# Patient Record
Sex: Male | Born: 1950
Health system: Southern US, Community
[De-identification: ages and names within clinical notes are randomized; demographics above are authoritative.]

## PROBLEM LIST (undated history)

## (undated) DIAGNOSIS — E119 Type 2 diabetes mellitus without complications: Secondary | ICD-10-CM

## (undated) DIAGNOSIS — Z9981 Dependence on supplemental oxygen: Secondary | ICD-10-CM

## (undated) DIAGNOSIS — F32A Depression, unspecified: Secondary | ICD-10-CM

## (undated) DIAGNOSIS — Z87898 Personal history of other specified conditions: Secondary | ICD-10-CM

## (undated) DIAGNOSIS — T4145XA Adverse effect of unspecified anesthetic, initial encounter: Secondary | ICD-10-CM

## (undated) DIAGNOSIS — T8859XA Other complications of anesthesia, initial encounter: Secondary | ICD-10-CM

## (undated) DIAGNOSIS — C439 Malignant melanoma of skin, unspecified: Secondary | ICD-10-CM

## (undated) DIAGNOSIS — Z9889 Other specified postprocedural states: Secondary | ICD-10-CM

## (undated) DIAGNOSIS — K219 Gastro-esophageal reflux disease without esophagitis: Secondary | ICD-10-CM

## (undated) DIAGNOSIS — F209 Schizophrenia, unspecified: Secondary | ICD-10-CM

## (undated) DIAGNOSIS — I499 Cardiac arrhythmia, unspecified: Secondary | ICD-10-CM

## (undated) DIAGNOSIS — Z8489 Family history of other specified conditions: Secondary | ICD-10-CM

## (undated) DIAGNOSIS — Z8611 Personal history of tuberculosis: Secondary | ICD-10-CM

## (undated) DIAGNOSIS — G4734 Idiopathic sleep related nonobstructive alveolar hypoventilation: Secondary | ICD-10-CM

## (undated) DIAGNOSIS — L509 Urticaria, unspecified: Secondary | ICD-10-CM

## (undated) DIAGNOSIS — C44519 Basal cell carcinoma of skin of other part of trunk: Secondary | ICD-10-CM

## (undated) DIAGNOSIS — Z789 Other specified health status: Secondary | ICD-10-CM

## (undated) DIAGNOSIS — K5904 Chronic idiopathic constipation: Secondary | ICD-10-CM

## (undated) DIAGNOSIS — Z8709 Personal history of other diseases of the respiratory system: Secondary | ICD-10-CM

## (undated) DIAGNOSIS — G4733 Obstructive sleep apnea (adult) (pediatric): Secondary | ICD-10-CM

## (undated) DIAGNOSIS — A159 Respiratory tuberculosis unspecified: Secondary | ICD-10-CM

## (undated) DIAGNOSIS — F419 Anxiety disorder, unspecified: Secondary | ICD-10-CM

## (undated) DIAGNOSIS — I10 Essential (primary) hypertension: Secondary | ICD-10-CM

## (undated) DIAGNOSIS — F329 Major depressive disorder, single episode, unspecified: Secondary | ICD-10-CM

## (undated) DIAGNOSIS — J441 Chronic obstructive pulmonary disease with (acute) exacerbation: Secondary | ICD-10-CM

## (undated) DIAGNOSIS — Z8679 Personal history of other diseases of the circulatory system: Secondary | ICD-10-CM

## (undated) DIAGNOSIS — M1990 Unspecified osteoarthritis, unspecified site: Secondary | ICD-10-CM

## (undated) DIAGNOSIS — Z87820 Personal history of traumatic brain injury: Secondary | ICD-10-CM

## (undated) DIAGNOSIS — J439 Emphysema, unspecified: Secondary | ICD-10-CM

## (undated) DIAGNOSIS — Z87891 Personal history of nicotine dependence: Secondary | ICD-10-CM

## (undated) DIAGNOSIS — J45909 Unspecified asthma, uncomplicated: Secondary | ICD-10-CM

## (undated) DIAGNOSIS — R112 Nausea with vomiting, unspecified: Secondary | ICD-10-CM

## (undated) DIAGNOSIS — N320 Bladder-neck obstruction: Secondary | ICD-10-CM

## (undated) DIAGNOSIS — G14 Postpolio syndrome: Secondary | ICD-10-CM

## (undated) DIAGNOSIS — C61 Malignant neoplasm of prostate: Secondary | ICD-10-CM

## (undated) DIAGNOSIS — J449 Chronic obstructive pulmonary disease, unspecified: Secondary | ICD-10-CM

## (undated) HISTORY — DX: Basal cell carcinoma of skin of other part of trunk: C44.519

## (undated) HISTORY — PX: ADENOIDECTOMY: SUR15

## (undated) HISTORY — PX: CARDIOVASCULAR STRESS TEST: SHX262

## (undated) HISTORY — DX: Other specified health status: Z78.9

## (undated) HISTORY — DX: Unspecified asthma, uncomplicated: J45.909

## (undated) HISTORY — DX: Chronic idiopathic constipation: K59.04

## (undated) HISTORY — DX: Gastro-esophageal reflux disease without esophagitis: K21.9

## (undated) HISTORY — PX: SHOULDER ARTHROSCOPY WITH OPEN ROTATOR CUFF REPAIR: SHX6092

## (undated) HISTORY — DX: Personal history of nicotine dependence: Z87.891

## (undated) HISTORY — PX: TONSILLECTOMY: SUR1361

## (undated) HISTORY — DX: Emphysema, unspecified: J43.9

## (undated) HISTORY — PX: OTHER SURGICAL HISTORY: SHX169

## (undated) HISTORY — DX: Respiratory tuberculosis unspecified: A15.9

## (undated) HISTORY — DX: Obstructive sleep apnea (adult) (pediatric): G47.33

## (undated) HISTORY — DX: Malignant neoplasm of prostate: C61

## (undated) HISTORY — DX: Urticaria, unspecified: L50.9

---

## 1982-10-15 DIAGNOSIS — A159 Respiratory tuberculosis unspecified: Secondary | ICD-10-CM

## 1982-10-15 HISTORY — DX: Respiratory tuberculosis unspecified: A15.9

## 1998-10-15 HISTORY — PX: NASAL SEPTUM SURGERY: SHX37

## 1998-10-15 HISTORY — PX: OTHER SURGICAL HISTORY: SHX169

## 2006-09-30 ENCOUNTER — Emergency Department (HOSPITAL_COMMUNITY): Admission: EM | Admit: 2006-09-30 | Discharge: 2006-10-01 | Payer: Self-pay | Admitting: Emergency Medicine

## 2006-10-02 ENCOUNTER — Encounter: Admission: RE | Admit: 2006-10-02 | Discharge: 2006-10-02 | Payer: Self-pay | Admitting: Orthopaedic Surgery

## 2007-12-24 ENCOUNTER — Ambulatory Visit: Payer: Self-pay | Admitting: Vascular Surgery

## 2007-12-24 ENCOUNTER — Ambulatory Visit: Admission: RE | Admit: 2007-12-24 | Discharge: 2007-12-24 | Payer: Self-pay | Admitting: Family Medicine

## 2007-12-24 ENCOUNTER — Encounter (INDEPENDENT_AMBULATORY_CARE_PROVIDER_SITE_OTHER): Payer: Self-pay | Admitting: Orthopedic Surgery

## 2009-02-10 ENCOUNTER — Encounter: Admission: RE | Admit: 2009-02-10 | Discharge: 2009-02-10 | Payer: Self-pay | Admitting: Orthopedic Surgery

## 2009-03-22 ENCOUNTER — Encounter: Payer: Self-pay | Admitting: Urology

## 2009-03-22 ENCOUNTER — Ambulatory Visit (HOSPITAL_BASED_OUTPATIENT_CLINIC_OR_DEPARTMENT_OTHER): Admission: RE | Admit: 2009-03-22 | Discharge: 2009-03-22 | Payer: Self-pay | Admitting: Urology

## 2009-03-22 HISTORY — PX: OTHER SURGICAL HISTORY: SHX169

## 2009-07-26 ENCOUNTER — Encounter: Admission: RE | Admit: 2009-07-26 | Discharge: 2009-07-26 | Payer: Self-pay | Admitting: Family Medicine

## 2011-01-22 LAB — POCT I-STAT 4, (NA,K, GLUC, HGB,HCT)
Glucose, Bld: 108 mg/dL — ABNORMAL HIGH (ref 70–99)
HCT: 45 % (ref 39.0–52.0)

## 2011-02-27 NOTE — Op Note (Signed)
[name], [name]              ACCOUNT NO.:  [id number]   MEDICAL RECORD NO.:  [id number]          PATIENT TYPE:  AMB   LOCATION:  [facility]                         FACILITY:  [facility]   PHYSICIAN:  Excell Seltzer. Annabell Howells, M.D.    DATE OF BIRTH:  [birth year]   DATE OF PROCEDURE:  03/22/2009  DATE OF DISCHARGE:                               OPERATIVE REPORT   PROCEDURES:  1. Cystoscopy.  2. Cystogram with interpretation.  3. Transrectal prostate ultrasound with biopsy.   PREOPERATIVE DIAGNOSES:  Nodular prostate with voiding difficulties and  elevated prostatic specific antigen.   POSTOPERATIVE DIAGNOSES:  1. Nodular prostate with voiding difficulties and elevated prostatic      specific antigen.  2. Grade 1 right ureteral reflux and bilateral hutch diverticula.   SURGEON:  Dr. Bjorn Pippin   ANESTHESIA:  General.   SPECIMEN:  Prostate biopsies x12.   COMPLICATIONS:  None.   INDICATIONS:  [name] is a 60 year old white male with history of  Klinefelter syndrome.  He has a history of an elevated PSA and  hypogonadism.  On exam, he was found to have a nodular prostate in the  right mid prostate.   FINDINGS AND PROCEDURE:  He was taken to the operating room where he  received Cipro and Rocephin.  He was given preoperative enema, as well  as sedation.  He was placed under general anesthetic and then positioned  in the lithotomy position.  His perineum and genitalia were prepped  Betadine solution.  He was draped in the usual sterile fashion.  Cystoscopy was performed using the 16-French flexible scope.  Examination revealed a normal urethra.  The external sphincter was  intact.  The prostatic urethra was short with no evidence of hyperplasia  and the bladder neck was open.  Examination of the bladder revealed mild  trabeculation.  There were bilateral such diverticula, left greater than  right.  Initially, I was unable to identify the ureteral orifices.   I performed a cystogram with 150  mL of Omnipaque.  This confirmed the  presence of hutch diverticula.  There appeared to be at most mild grade  1 reflux on the right.   After completion of the cystometrogram which only showed the hutch  diverticula and minimal reflux, repeat cystoscopy was performed.  At  this point, I was able to identify the ureteral orifices.  They were  small but medial to the diverticula.  The bladder was then drained and a  prostate ultrasound was performed.   The transrectal ultrasound probe had been prepared and assembled for  biopsy.  It was passed transrectally.  The prostate was imaged.  The  ultrasound demonstrated normal-appearing seminal vesicles.  The  diverticula could be visualized at the base of the bladder.  The  prostate was 39 mL.  There was some vague asymmetry of the glans, the  left having a slightly more hypoechoic variegated echotexture than the  right, but otherwise the peripheral and transitional zones were  unremarkable.  There was no significant median lobe.   After completion of the diagnostic scan, transrectal ultrasound biopsy  was  performed using the standard 12 core template.  Each biopsy was sent  in a separate bottle.  At the completion of the biopsy, there was some  mild rectal bleeding which was controlled with pressure on the prostate.  Once the bleeding had stopped, the patient was taken down from lithotomy  position.  His anesthetic was reversed.  He was moved to the recovery  room in stable condition.  There were no complications.      Excell Seltzer. Annabell Howells, M.D.  Electronically Signed     JJW/MEDQ  D:  03/22/2009  T:  03/22/2009  Job:  [id number]   cc:   Windle Guard, M.D.  Fax: [phone]

## 2011-08-08 ENCOUNTER — Emergency Department (HOSPITAL_COMMUNITY)
Admission: EM | Admit: 2011-08-08 | Discharge: 2011-08-08 | Disposition: A | Discharge status: Home / Self Care | Payer: Medicare Other | Attending: Emergency Medicine | Admitting: Emergency Medicine

## 2011-08-08 ENCOUNTER — Emergency Department (HOSPITAL_COMMUNITY): Payer: Medicare Other

## 2011-08-08 DIAGNOSIS — R062 Wheezing: Secondary | ICD-10-CM | POA: Insufficient documentation

## 2011-08-08 DIAGNOSIS — R0609 Other forms of dyspnea: Secondary | ICD-10-CM | POA: Insufficient documentation

## 2011-08-08 DIAGNOSIS — I10 Essential (primary) hypertension: Secondary | ICD-10-CM | POA: Insufficient documentation

## 2011-08-08 DIAGNOSIS — R0989 Other specified symptoms and signs involving the circulatory and respiratory systems: Secondary | ICD-10-CM | POA: Insufficient documentation

## 2011-08-08 DIAGNOSIS — J449 Chronic obstructive pulmonary disease, unspecified: Secondary | ICD-10-CM | POA: Insufficient documentation

## 2011-08-08 DIAGNOSIS — I252 Old myocardial infarction: Secondary | ICD-10-CM | POA: Insufficient documentation

## 2011-08-08 DIAGNOSIS — Z79899 Other long term (current) drug therapy: Secondary | ICD-10-CM | POA: Insufficient documentation

## 2011-08-08 DIAGNOSIS — R079 Chest pain, unspecified: Secondary | ICD-10-CM | POA: Insufficient documentation

## 2011-08-08 DIAGNOSIS — R05 Cough: Secondary | ICD-10-CM | POA: Insufficient documentation

## 2011-08-08 DIAGNOSIS — J4489 Other specified chronic obstructive pulmonary disease: Secondary | ICD-10-CM | POA: Insufficient documentation

## 2011-08-08 DIAGNOSIS — R0602 Shortness of breath: Secondary | ICD-10-CM | POA: Insufficient documentation

## 2011-08-08 DIAGNOSIS — R059 Cough, unspecified: Secondary | ICD-10-CM | POA: Insufficient documentation

## 2011-08-08 DIAGNOSIS — E7800 Pure hypercholesterolemia, unspecified: Secondary | ICD-10-CM | POA: Insufficient documentation

## 2011-08-08 LAB — COMPREHENSIVE METABOLIC PANEL
ALT: 47 U/L (ref 0–53)
Albumin: 3.7 g/dL (ref 3.5–5.2)
Alkaline Phosphatase: 51 U/L (ref 39–117)
BUN: 11 mg/dL (ref 6–23)
CO2: 21 mEq/L (ref 19–32)
Chloride: 98 mEq/L (ref 96–112)
GFR calc Af Amer: 80 mL/min — ABNORMAL LOW (ref >90)
Glucose, Bld: 103 mg/dL — ABNORMAL HIGH (ref 70–99)
Potassium: 2.9 mEq/L — ABNORMAL LOW (ref 3.5–5.1)
Total Protein: 6.8 g/dL (ref 6.0–8.3)

## 2011-08-08 LAB — CBC
HCT: 39 % (ref 39.0–52.0)
Hemoglobin: 13.7 g/dL (ref 13.0–17.0)
MCH: 32 pg (ref 26.0–34.0)

## 2011-08-08 LAB — DIFFERENTIAL
Basophils Absolute: 0 10*3/uL (ref 0.0–0.1)
Eosinophils Relative: 9 % — ABNORMAL HIGH (ref 0–5)
Lymphocytes Relative: 31 % (ref 12–46)
Neutrophils Relative %: 52 % (ref 43–77)

## 2011-10-16 HISTORY — PX: LAPAROSCOPIC CHOLECYSTECTOMY: SUR755

## 2011-11-20 ENCOUNTER — Encounter (HOSPITAL_COMMUNITY): Payer: Self-pay

## 2011-11-20 ENCOUNTER — Emergency Department (HOSPITAL_COMMUNITY)
Admission: EM | Admit: 2011-11-20 | Discharge: 2011-11-20 | Disposition: A | Discharge status: Home / Self Care | Payer: Medicare Other | Attending: Emergency Medicine | Admitting: Emergency Medicine

## 2011-11-20 DIAGNOSIS — J4489 Other specified chronic obstructive pulmonary disease: Secondary | ICD-10-CM | POA: Insufficient documentation

## 2011-11-20 DIAGNOSIS — Z8612 Personal history of poliomyelitis: Secondary | ICD-10-CM | POA: Insufficient documentation

## 2011-11-20 DIAGNOSIS — I10 Essential (primary) hypertension: Secondary | ICD-10-CM | POA: Insufficient documentation

## 2011-11-20 DIAGNOSIS — M25559 Pain in unspecified hip: Secondary | ICD-10-CM

## 2011-11-20 DIAGNOSIS — J449 Chronic obstructive pulmonary disease, unspecified: Secondary | ICD-10-CM | POA: Insufficient documentation

## 2011-11-20 DIAGNOSIS — Z79899 Other long term (current) drug therapy: Secondary | ICD-10-CM | POA: Insufficient documentation

## 2011-11-20 DIAGNOSIS — R110 Nausea: Secondary | ICD-10-CM | POA: Insufficient documentation

## 2011-11-20 HISTORY — DX: Chronic obstructive pulmonary disease, unspecified: J44.9

## 2011-11-20 HISTORY — DX: Essential (primary) hypertension: I10

## 2011-11-20 MED ORDER — OXYCODONE-ACETAMINOPHEN 5-325 MG PO TABS: 1.0000 | ORAL_TABLET | ORAL | Status: AC | PRN

## 2011-11-20 MED ORDER — ONDANSETRON HCL 4 MG PO TABS: 4.0000 mg | ORAL_TABLET | Freq: Four times a day (QID) | ORAL | Status: AC

## 2011-11-20 MED ORDER — HYDROMORPHONE HCL PF 2 MG/ML IJ SOLN
2.0000 mg | Freq: Once | INTRAMUSCULAR | Status: AC
  Administered 2011-11-20: 2 mg via INTRAMUSCULAR
  Filled 2011-11-20: qty 1

## 2011-11-20 NOTE — ED Notes (Signed)
Lt. Hip pain began a few months ago,  And the pain is progressively becoming worse,  He went to [facility] and was directed to [name] for pain control

## 2011-11-20 NOTE — ED Provider Notes (Signed)
61 year old male has chronic left hip pain 24 hours a day for several months gradually worsening and was seen by orthopedics was unremarkable films and sent to the ED for pain management, the patient is aware that the emergency room is not specialist and chronic pain management however we will help him acutely for a few days until he can get a recheck later this week with his doctor or orthopedics, his left hip is tender with movement he denies any distal weakness or numbness  Hurman Horn, MD 11/21/11 2127

## 2011-11-20 NOTE — ED Notes (Signed)
Sitting upright on side of stretcher with wife at bedside; states pain better since adm of Dilaudid; GingerAle with ice given per pt request

## 2011-11-20 NOTE — ED Provider Notes (Signed)
History     CSN: [id number]  Arrival date & time 11/20/11  5621   First MD Initiated Contact with Patient 11/20/11 2005      Chief Complaint  Patient presents with  . Hip Pain    (Consider location/radiation/quality/duration/timing/severity/associated sxs/prior treatment) Patient is a 61 y.o. male presenting with hip pain. The history is provided by the patient.  Hip Pain This is a chronic problem. Episode onset: months ago, worse today. The problem occurs constantly. The problem has been gradually worsening. Associated symptoms include nausea. Pertinent negatives include no abdominal pain, chest pain, congestion, coughing, fever, rash or vomiting. The symptoms are aggravated by standing, walking and twisting. He has tried oral narcotics for the symptoms. The treatment provided mild relief.    Past Medical History  Diagnosis Date  . COPD (chronic obstructive pulmonary disease)   . Hypertension   . Polio   . Bronchitis     Past Surgical History  Procedure Date  . Rotator cuff repair     History reviewed. No pertinent family history.  History  Substance Use Topics  . Smoking status: Former Games developer  . Smokeless tobacco: Not on file  . Alcohol Use: Yes      Review of Systems  Constitutional: Negative for fever.  HENT: Negative for congestion, facial swelling and trouble swallowing.   Respiratory: Negative for cough and shortness of breath.   Cardiovascular: Negative for chest pain.  Gastrointestinal: Positive for nausea. Negative for vomiting, abdominal pain and diarrhea.  Genitourinary: Negative for difficulty urinating.  Skin: Negative for rash.  All other systems reviewed and are negative.    Allergies  Ivp dye; Penicillins; and Aspirin  Home Medications   Current Outpatient Rx  Name Route Sig Dispense Refill  . IPRATROPIUM-ALBUTEROL 18-103 MCG/ACT IN AERO Inhalation Inhale 2 puffs into the lungs every 6 (six) hours as needed.    Marland Kitchen DICYCLOMINE HCL 20 MG PO  TABS Oral Take 20 mg by mouth every 8 (eight) hours as needed. For IBS    . ESOMEPRAZOLE MAGNESIUM 40 MG PO CPDR Oral Take 40 mg by mouth 2 (two) times daily.    Marland Kitchen METOCLOPRAMIDE HCL 10 MG PO TABS Oral Take 10 mg by mouth 4 (four) times daily.    . MOMETASONE FURO-FORMOTEROL FUM 100-5 MCG/ACT IN AERO Inhalation Inhale 2 puffs into the lungs 2 (two) times daily.    Marland Kitchen MONTELUKAST SODIUM 10 MG PO TABS Oral Take 10 mg by mouth at bedtime.    Marland Kitchen NITROGLYCERIN 0.4 MG SL SUBL Sublingual Place 0.4 mg under the tongue every 5 (five) minutes as needed. For chest pain    . TRIAMTERENE-HCTZ 75-50 MG PO TABS Oral Take 0.5 tablets by mouth daily.    Marland Kitchen VERAPAMIL HCL ER 120 MG PO CP24 Oral Take 120 mg by mouth 2 (two) times daily.      BP 128/76  Pulse 92  Temp(Src) 97.5 F (36.4 C) (Oral)  Resp 18  Ht 6\' 1"  (1.854 m)  Wt 203 lb (92.08 kg)  BMI 26.78 kg/m2  SpO2 98%  Physical Exam  Nursing note and vitals reviewed. Constitutional: He is oriented to person, place, and time. He appears well-developed and well-nourished. No distress.  HENT:  Head: Normocephalic and atraumatic.  Mouth/Throat: Oropharynx is clear and moist.  Eyes: Conjunctivae are normal. Pupils are equal, round, and reactive to light. No scleral icterus.  Neck: Normal range of motion. Neck supple.  Cardiovascular: Normal rate, regular rhythm, normal heart sounds and intact  distal pulses.   No murmur heard. Pulmonary/Chest: Effort normal and breath sounds normal. No stridor. No respiratory distress. He has no wheezes. He has no rales.  Abdominal: Soft. He exhibits no distension. There is no tenderness.  Musculoskeletal: He exhibits no edema.       Left hip: He exhibits decreased range of motion (from pain). He exhibits no swelling and no deformity.       Legs:      LLE: Good distal pulses.  Decreased strength in plantar and dorsiflexion thought to be effort dependant.  Patient able to swing legs in bed, stand up, and ambulate.  Normal  sensation.  Neurological: He is alert and oriented to person, place, and time.  Skin: Skin is warm and dry. No rash noted.  Psychiatric: He has a normal mood and affect. His behavior is normal.    ED Course  Procedures (including critical care time)  Labs Reviewed - No data to display No results found.   1. Hip pain       MDM  61 yo male with hx of polio with mild atrophy of his right leg presenting with acute on chronic left hip pain.  Pain to anterior hip.  No swelling, no redness, no warmth.  No fevers.  Exam was initially limited by pain, but improved after 2mg  IM dilaudid, including strength testing.  No evidence of septic joint.  Pt able to ambulate.  He was evaluated by an orthopedic PA and told to come to the ED for pain control.  He brought plain films which were obtained at the Orthopedist's office.  No report came with images, but plain films did not show any obvious deformity or significant degenerative changes.  Don't suspect occult hip fracture, as patient has no history of trauma.  Once pain under control, patient felt ready for discharge home.  Will follow up with orthopedics.          Warnell Forester, MD 11/20/11 [phone]

## 2011-11-20 NOTE — ED Notes (Signed)
Reports gradual onset of hip pain - had MRI of abd x4 months ago - incidental finding of left hip "deterioration" per pt; states pain progressively worse since - excruciating over last 2 days; ambulates with minimal difficulty secondary to pain; denies loss of bowel or bladder function

## 2011-11-21 NOTE — ED Provider Notes (Signed)
I saw and evaluated the patient, reviewed the resident's note and I agree with the findings and plan.  Hurman Horn, MD 11/21/11 2127

## 2011-12-05 ENCOUNTER — Emergency Department (HOSPITAL_COMMUNITY): Payer: Medicare Other

## 2011-12-05 ENCOUNTER — Emergency Department (HOSPITAL_COMMUNITY)
Admission: EM | Admit: 2011-12-05 | Discharge: 2011-12-05 | Disposition: A | Discharge status: Home Health | Payer: Medicare Other | Attending: Emergency Medicine | Admitting: Emergency Medicine

## 2011-12-05 ENCOUNTER — Encounter (HOSPITAL_COMMUNITY): Payer: Self-pay | Admitting: Neurology

## 2011-12-05 ENCOUNTER — Other Ambulatory Visit: Payer: Self-pay

## 2011-12-05 DIAGNOSIS — Z79899 Other long term (current) drug therapy: Secondary | ICD-10-CM | POA: Insufficient documentation

## 2011-12-05 DIAGNOSIS — J449 Chronic obstructive pulmonary disease, unspecified: Secondary | ICD-10-CM | POA: Insufficient documentation

## 2011-12-05 DIAGNOSIS — I10 Essential (primary) hypertension: Secondary | ICD-10-CM | POA: Insufficient documentation

## 2011-12-05 DIAGNOSIS — M87051 Idiopathic aseptic necrosis of right femur: Secondary | ICD-10-CM

## 2011-12-05 DIAGNOSIS — R079 Chest pain, unspecified: Secondary | ICD-10-CM | POA: Insufficient documentation

## 2011-12-05 DIAGNOSIS — J4489 Other specified chronic obstructive pulmonary disease: Secondary | ICD-10-CM | POA: Insufficient documentation

## 2011-12-05 DIAGNOSIS — M87059 Idiopathic aseptic necrosis of unspecified femur: Secondary | ICD-10-CM | POA: Insufficient documentation

## 2011-12-05 DIAGNOSIS — I209 Angina pectoris, unspecified: Secondary | ICD-10-CM | POA: Insufficient documentation

## 2011-12-05 DIAGNOSIS — M25559 Pain in unspecified hip: Secondary | ICD-10-CM

## 2011-12-05 DIAGNOSIS — M1610 Unilateral primary osteoarthritis, unspecified hip: Secondary | ICD-10-CM | POA: Insufficient documentation

## 2011-12-05 LAB — URINALYSIS, ROUTINE W REFLEX MICROSCOPIC
Glucose, UA: NEGATIVE mg/dL
Ketones, ur: NEGATIVE mg/dL
Leukocytes, UA: NEGATIVE
pH: 6.5 (ref 5.0–8.0)

## 2011-12-05 LAB — COMPREHENSIVE METABOLIC PANEL
Albumin: 3.7 g/dL (ref 3.5–5.2)
Alkaline Phosphatase: 45 U/L (ref 39–117)
BUN: 15 mg/dL (ref 6–23)
Potassium: 4.2 mEq/L (ref 3.5–5.1)
Sodium: 131 mEq/L — ABNORMAL LOW (ref 135–145)
Total Protein: 7 g/dL (ref 6.0–8.3)

## 2011-12-05 LAB — CBC
HCT: 40.1 % (ref 39.0–52.0)
MCHC: 34.7 g/dL (ref 30.0–36.0)
RDW: 13.7 % (ref 11.5–15.5)

## 2011-12-05 LAB — TROPONIN I: Troponin I: <0.3 ng/mL (ref <0.30)

## 2011-12-05 MED ORDER — LORAZEPAM 2 MG/ML IJ SOLN
1.0000 mg | Freq: Once | INTRAMUSCULAR | Status: AC
  Administered 2011-12-05: 1 mg via INTRAVENOUS
  Filled 2011-12-05: qty 1

## 2011-12-05 MED ORDER — OXYCODONE-ACETAMINOPHEN 5-325 MG PO TABS: 1.0000 | ORAL_TABLET | ORAL | Status: AC | PRN

## 2011-12-05 MED ORDER — ONDANSETRON HCL 4 MG/2ML IJ SOLN
4.0000 mg | Freq: Once | INTRAMUSCULAR | Status: AC
  Administered 2011-12-05: 4 mg via INTRAVENOUS
  Filled 2011-12-05: qty 2

## 2011-12-05 MED ORDER — HYDROMORPHONE HCL PF 1 MG/ML IJ SOLN
1.0000 mg | Freq: Once | INTRAMUSCULAR | Status: AC
  Administered 2011-12-05: 1 mg via INTRAVENOUS
  Filled 2011-12-05: qty 1

## 2011-12-05 MED ORDER — OXYCODONE-ACETAMINOPHEN 5-325 MG PO TABS
1.0000 | ORAL_TABLET | Freq: Once | ORAL | Status: AC
  Administered 2011-12-05: 1 via ORAL
  Filled 2011-12-05: qty 1

## 2011-12-05 MED ORDER — SODIUM CHLORIDE 0.9 % IV SOLN
20.0000 mL | INTRAVENOUS | Status: DC
  Administered 2011-12-05 (×2): 20 mL via INTRAVENOUS

## 2011-12-05 NOTE — ED Notes (Addendum)
Pt stated that he was going to his orthopedic and he heard a "crack" in his leg. He then stated that he started to worry and have chest pressure. Per pt, the chest pressure felt like "a ton of bricks on his chest". He stated that he also begin to have SOB with no N/V. Currently, he has been having intermittent  Chest pressure. The pressure has decreased but is still present. Pain is now radiating to Left arm and left leg. EDP is aware. Medication was given. Will continue to monitor.

## 2011-12-05 NOTE — ED Notes (Signed)
ems reporting pt was driving to [facility] to see MD regarding left chronic hip pain, while driving pt developed sudden CP. Central CP, no radiation, pos SOB. Reporting nausea, denying any diaphoresis. Pain is intermittent. Allergy to aspirin, pt took 4 SL nitro on his own. EMS gave pt 1 SL nitro. No relief. 130/70, HR 80 SR. EKG normal per EMS. Alert and oriented. Nausea subsided. Alert and oriented.

## 2011-12-05 NOTE — ED Notes (Signed)
Pt. Given Ginger Ale w/ ice. Ok'd per Dr. Effie Shy. No other needs voiced at this time. Reminded pt. Of need for a urine sample.

## 2011-12-05 NOTE — Discharge Instructions (Signed)
Followup with her primary care doctor as soon as possible for further treatment.  Avascular Necrosis Avascular necrosis is a disease resulting from the temporary or permanent loss of the blood supply to the bones. Without blood, the bone tissue dies and causes the bone to become soft. If the process involves the bone near a joint, it may lead to collapse of the joint surface. This disease is also known as:  Osteonecrosis.   Aseptic necrosis.   Ischemic bone necrosis.  Avascular necrosis most commonly affects the ends (epiphysis) of long bones. The femur, the bone extending from the knee joint to the hip joint, is the bone most commonly involved. The disease may affect 1 bone, more than 1 bone at the same time, more than 1 bone at different times. It affects men and women equally. Avascular necrosis occurs at any age. But it is more common between the ages of 77 and 50 years. SYMPTOMS  In early stages patients may not have any symptoms. But as the disease progresses, joint pain generally develops. At first there is pain when putting weight on the affected joint, and then when resting. Pain usually develops gradually. It may be mild or severe. As the disease progresses and the bone and surrounding joint surface collapses, pain may develop or increase dramatically. Pain may be severe enough to limit range of motion in the affected joint. The period of time between the first symptoms and loss of joint function is different for each patient. This can range from several months to more than a year. Disability depends on:  What part of the bone is affected.   How large an area is involved.   How effectively the bone repairs itself.   If other illnesses are present.   If you are being treated for cancer with medications (chemotherapy).   Radiation.   The cause of the avascular necrosis.  DIAGNOSIS  The diagnosis of aseptic necrosis is usually made by:  Taking a history.   Doing an exam.    Taking X-rays. (If X-rays are normal, an MRI may be required.)   Sometimes further blood work and specialized studies may be necessary.  TREATMENT  Treatment for this disease is necessary to maintain joint function. If untreated, most patients will suffer severe pain and limitation in movement within 2 years. Several treatments are available that help prevent further bone and joint damage. They can also reduce pain. To determine the most appropriate treatment, the caregiver considers the following aspects of a patient's disease:  The age of the patient.   The stage of the disease (early or late).   The location and amount of bone affected. It may be a small or large area.   The underlying cause of avascular necrosis.  The goals in treatment are to:  Improve the patient's use of the affected joint.   Stop further damage to the bone.   Improve bone and joint survival.  Your caregiver may use one or more of the following treatments:  Reduced weight bearing. If avascular necrosis is diagnosed early, the caregiver may begin treatment by having the patient limit weight on the affected joint. The caregiver may recommend limiting activities or using crutches. In some cases, reduced weight bearing can slow the damage caused by the disease and permit natural healing. When combined with medication to reduce pain, reduced weight bearing can be an effective way to avoid or delay surgery for some patients. Most patients eventually will need surgery to reconstruct the  joint.   Core decompression. Core decompression works best in people who are in the earliest stages of avascular necrosis, before the collapse of the joint. This procedure often can reduce pain and slow the progression of bone and joint destruction in these patients. This surgical procedure removes the inner layer of bone, which:   Reduces pressure within the bone.   Increases blood flow to the bone.   Allows more blood vessels to  form.   Reduces pain.   Osteotomy. This surgical procedure re-shapes the bone to reduce stress on the affected area of the joint. There is a lengthy recovery period. The patient's activities are very limited for 3 to 12 months after an osteotomy. This procedure is most effective for younger patients with advanced avascular necrosis, and those with a large area of affected bone.   Bone Graft. A bone graft may be used to support a joint after core decompression. Bone grafting is surgery that transplants healthy bone from one part of the patient, such as the leg, to the diseased area. Sometimes the bone is taken with it's blood vessels which are attached to local blood vessels near the area of bone collapse. This is called a vascularized bone graft. There is a lengthy recovery period after a bone graft, usually from 6 to 12 months. This procedure is technically complex.   Arthroplasty. Arthroplasty is also known as total joint replacement. Total joint replacement is used in late-stage avascular necrosis, and when the joint is deformed. In this surgery, the diseased joint is replaced with artificial parts. It may be recommended for people who are not good candidates for other treatments, such as patients who may not do well with repeated attempts to preserve the joint. Various types of replacements are available, and patients should discuss specific needs with their caregiver.  New treatments being tried include:  The use of medications.   Electrical stimulation.   Combination therapies to increase the growth of new bone and blood vessels.  Document Released: 03/23/2002 Document Revised: 06/13/2011 Document Reviewed: 05/24/2009 [facility] Patient Information 2012 [place], [place].Chest Pain, Nonspecific Today you have had an exam and tests to determine a specific cause for your chest pain. It is often hard to give a specific diagnosis as the cause of one's chest pain. There is always a chance that your  pain could be related to something serious, like a heart attack or a blood clot in the lungs. You need to follow up with your caregiver for further evaluation. More lab tests or other studies such as x-rays, an electrocardiogram, stress testing, or cardiac imaging may be needed to find the cause of your pain. Most of the time nonspecific chest pain will be improved within 2-3 days of rest and mild pain medicine. For the next few days avoid physical exertion or activities that bring on the pain. Do not smoke or drink alcohol until all your symptoms are gone. Quitting smoking is the number one way to reduce your risk for heart and lung disease. Call your caregiver for routine follow-up as advised.  SEEK IMMEDIATE MEDICAL CARE IF:  You develop increased chest pain, or pain that radiates to the arm, neck, jaw, back or abdomen.   You develop shortness of breath, increasing cough or coughing up blood.   You have severe back or abdominal pain, nausea or vomiting.   You develop severe weakness, fainting, fever or chills.  Document Released: 10/01/2005 Document Revised: 06/13/2011 Document Reviewed: 03/21/2007 [facility] Patient Information 2012 [place], [place].

## 2011-12-05 NOTE — ED Provider Notes (Signed)
History     CSN: [id number]  Arrival date & time 12/05/11  1815   First MD Initiated Contact with Patient 12/05/11 1820      Chief Complaint  Patient presents with  . Chest Pain    (Consider location/radiation/quality/duration/timing/severity/associated sxs/prior treatment) HPI [name] is a 61 y.o. male who was on his way to the emergency room in for left hip pain when he had sudden onset of chest pain. He took 4 nitroglycerin tablets without relief of the pain. An ambulance was called, and they given a sublingual nitroglycerin, again without relief of pain. 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 He has been following with his orthopedist for chronic left hip pain. He took oxycodone for the hip pain today. It helps some. He has no shortness of breath, lower leg pain, or swelling Nausea vomiting, headache, weakness, chills. Patient states he had cardiac catheterization about one year ago and was told it was normal. He uses sublingual nitroglycerin, at least once a week for chest pain that is ongoing. His cardiologist is in [facility]     Past Medical History  Diagnosis Date  . COPD (chronic obstructive pulmonary disease)   . Hypertension   . Polio   . Bronchitis   . DJD (degenerative joint disease) of hip   . Angina pectoris     Past Surgical History  Procedure Date  . Rotator cuff repair     No family history on file.  History  Substance Use Topics  . Smoking status: Former Games developer  . Smokeless tobacco: Not on file  . Alcohol Use: Yes      Review of Systems  All other systems reviewed and are negative.   emergency department treatment: IV fluids, IV Dilaudid, and IV Zofran. Reevaluation  21:50- patient is comfortable at this time. He has no further complain some states that his chest pain is better as well as the hip pain. The patient asked whether or chronic pain treatment would be helpful for him. I told him to ask his  doctor,.  Allergies  Ivp dye; Penicillins; and Aspirin  Home Medications   Current Outpatient Rx  Name Route Sig Dispense Refill  . IPRATROPIUM-ALBUTEROL 18-103 MCG/ACT IN AERO Inhalation Inhale 2 puffs into the lungs every 6 (six) hours as needed. For shortness of breath    . CLINDAMYCIN HCL 150 MG PO CAPS Oral Take 450 mg by mouth 3 (three) times daily.     . COLCHICINE 0.6 MG PO TABS Oral Take 0.6 mg by mouth daily.    Marland Kitchen DIAZEPAM 10 MG PO TABS Oral Take 10 mg by mouth every morning.    Marland Kitchen DICYCLOMINE HCL 20 MG PO TABS Oral Take 20 mg by mouth every 8 (eight) hours as needed. For IBS    . ESOMEPRAZOLE MAGNESIUM 40 MG PO CPDR Oral Take 40 mg by mouth 2 (two) times daily.    Marland Kitchen FLUTICASONE FUROATE 27.5 MCG/SPRAY NA SUSP Nasal Place 2 sprays into the nose daily.    Marland Kitchen HYDROCODONE-ACETAMINOPHEN 5-500 MG PO CAPS Oral Take 1 capsule by mouth every 6 (six) hours as needed. For pain    . LORATADINE 10 MG PO TABS Oral Take 10 mg by mouth daily.    Marland Kitchen METHOCARBAMOL 500 MG PO TABS Oral Take 500 mg by mouth 4 (four) times daily.    Marland Kitchen METOCLOPRAMIDE HCL 10 MG PO TABS Oral Take by mouth 4 (four) times daily.     Marland Kitchen  MOMETASONE FURO-FORMOTEROL FUM 100-5 MCG/ACT IN AERO Inhalation Inhale 2 puffs into the lungs 2 (two) times daily.    Marland Kitchen MONTELUKAST SODIUM 10 MG PO TABS Oral Take 10 mg by mouth at bedtime.    Marland Kitchen NITROGLYCERIN 0.4 MG SL SUBL Sublingual Place 0.4 mg under the tongue every 5 (five) minutes as needed. For chest pain    . PRAVASTATIN SODIUM 40 MG PO TABS Oral Take 40 mg by mouth daily.    Marland Kitchen VIOXX PO Oral Take 1 tablet by mouth daily.     . ROFLUMILAST 500 MCG PO TABS Oral Take 500 mcg by mouth daily.    Marland Kitchen ROPINIROLE HCL 1 MG PO TABS Oral Take 1 mg by mouth 3 (three) times daily.    Marland Kitchen TAMSULOSIN HCL 0.4 MG PO CAPS Oral Take 0.4 mg by mouth daily.    . TRIAMTERENE-HCTZ 75-50 MG PO TABS Oral Take 0.5 tablets by mouth daily.    Marland Kitchen VERAPAMIL HCL ER 120 MG PO CP24 Oral Take 120 mg by mouth 2 (two) times  daily.    . OXYCODONE-ACETAMINOPHEN 5-325 MG PO TABS Oral Take 1 tablet by mouth every 4 (four) hours as needed for pain. 15 tablet 0    BP 116/62  Pulse 73  Temp(Src) 96.6 F (35.9 C) (Oral)  Resp 20  SpO2 98%  Physical Exam  Nursing note and vitals reviewed. Constitutional: He is oriented to person, place, and time. He appears well-developed and well-nourished.  HENT:  Head: Normocephalic and atraumatic.  Right Ear: External ear normal.  Left Ear: External ear normal.  Eyes: Conjunctivae and EOM are normal. Pupils are equal, round, and reactive to light.  Neck: Normal range of motion and phonation normal. Neck supple.  Cardiovascular: Normal rate, regular rhythm, normal heart sounds and intact distal pulses.   Pulmonary/Chest: Effort normal and breath sounds normal. He exhibits no bony tenderness.  Abdominal: Soft. Normal appearance. He exhibits no mass. There is no tenderness. There is no guarding.       The abdomen is soft. It is nontender. There is no palpable mass or pulsations  Musculoskeletal:       Left hip is tender to palpation and has pain with passive range of motion that limits the movement  Neurological: He is alert and oriented to person, place, and time. He has normal strength. No cranial nerve deficit or sensory deficit. He exhibits normal muscle tone. Coordination normal.  Skin: Skin is warm, dry and intact.  Psychiatric: His behavior is normal. Judgment and thought content normal.       The patient is very anxious. He is clutching his left chest    ED Course  Procedures (including critical care time)  Date: 12/05/2011  Rate: 75  Rhythm: normal sinus rhythm  QRS Axis: right  Intervals: normal  ST/T Wave abnormalities: normal  Conduction Disutrbances:none  Narrative Interpretation: axis shifted  Old EKG Reviewed: changes noted     Labs Reviewed  COMPREHENSIVE METABOLIC PANEL - Abnormal; Notable for the following:    Sodium 131 (*)    Chloride 95 (*)     Glucose, Bld 107 (*)    GFR calc non Af Amer 86 (*)    All other components within normal limits  CBC  URINALYSIS, ROUTINE W REFLEX MICROSCOPIC  TROPONIN I   Dg Chest 2 View  12/05/2011  *RADIOLOGY REPORT*  Clinical Data: Shortness of breath.  COPD.  Pipe smoker.  CHEST - 2 VIEW  Comparison: 11/14/2011.  Findings:  Normal sized heart.  Increased linear density at the left lung base.  Clear right lung.  Mild thoracic spine degenerative changes.  IMPRESSION: Increased linear atelectasis or scarring at the left lung base.  Original Report Authenticated By: Darrol Angel, M.D.   Dg Hip Complete Left  12/05/2011  *RADIOLOGY REPORT*  Clinical Data: Left hip pain.  LEFT HIP - COMPLETE 2+ VIEW  Comparison: 11/14/2011  Findings: Lucency and sclerosis in the left femoral head is compatible with avascular necrosis, and similar to prior exams including the CT scan from 11/14/2011.  No contour defect or collapse of the femoral head is currently visible.  No radiographic findings of right femoral head avascular necrosis.  Facet arthropathy noted at the lumbosacral junction.  IMPRESSION:  1.  Left femoral head avascular necrosis, without contour abnormality, collapse, or new fracture. 2.  Facet arthropathy at L5-S1.  Original Report Authenticated By: Dellia Cloud, M.D.     1. Chest pain   2. Hip pain   3. Avascular necrosis of bones of both hips       MDM  Nonspecific chest pain, atypical for cardiac disease, with negative evaluation in the ED. Patient has improved, with a single dose of narcotic. Left hip pain is likely secondary to his avascular necrosis, but there is no apparent fracture. Patient has been ambulatory since the incident when he. He is stable for discharge with symptomatic treatment.        Flint Melter, MD 12/05/11 2202

## 2011-12-05 NOTE — ED Notes (Signed)
Re-iterated the importance of the pt giving a urine sample. Pt stated that he is aware. Pt stated that he will try to go  After drinking the ginger-ale

## 2011-12-05 NOTE — ED Notes (Signed)
Patient provided urinal.  States he is unable to void at this time.

## 2011-12-05 NOTE — ED Notes (Signed)
Educated pt not to drive or operate heavy machinery while on pain medication. Also informed the pt that the pain medication has Tylenol in and he should speak with primary doctor regarding taking OTC Tylenol. Pt verbalized understanding.

## 2011-12-05 NOTE — ED Notes (Signed)
Pt. Resting on stretcher. Wife at bedside. Pt. Requesting pain meds for hip pain. Nurse notified of same. Wife requesting Diet Coke. No other needs voiced.

## 2012-01-21 ENCOUNTER — Emergency Department (HOSPITAL_COMMUNITY)
Admission: EM | Admit: 2012-01-21 | Discharge: 2012-01-22 | Disposition: A | Discharge status: Home / Self Care | Payer: Medicare Other | Attending: Emergency Medicine | Admitting: Emergency Medicine

## 2012-01-21 ENCOUNTER — Encounter (HOSPITAL_COMMUNITY): Payer: Self-pay | Admitting: *Deleted

## 2012-01-21 ENCOUNTER — Emergency Department (HOSPITAL_COMMUNITY): Payer: Medicare Other

## 2012-01-21 DIAGNOSIS — W11XXXA Fall on and from ladder, initial encounter: Secondary | ICD-10-CM | POA: Insufficient documentation

## 2012-01-21 DIAGNOSIS — W19XXXA Unspecified fall, initial encounter: Secondary | ICD-10-CM

## 2012-01-21 DIAGNOSIS — M25559 Pain in unspecified hip: Secondary | ICD-10-CM | POA: Insufficient documentation

## 2012-01-21 DIAGNOSIS — R51 Headache: Secondary | ICD-10-CM | POA: Insufficient documentation

## 2012-01-21 DIAGNOSIS — S161XXA Strain of muscle, fascia and tendon at neck level, initial encounter: Secondary | ICD-10-CM

## 2012-01-21 DIAGNOSIS — S139XXA Sprain of joints and ligaments of unspecified parts of neck, initial encounter: Secondary | ICD-10-CM | POA: Insufficient documentation

## 2012-01-21 DIAGNOSIS — M542 Cervicalgia: Secondary | ICD-10-CM | POA: Insufficient documentation

## 2012-01-21 DIAGNOSIS — Z79899 Other long term (current) drug therapy: Secondary | ICD-10-CM | POA: Insufficient documentation

## 2012-01-21 DIAGNOSIS — S7002XA Contusion of left hip, initial encounter: Secondary | ICD-10-CM

## 2012-01-21 DIAGNOSIS — R42 Dizziness and giddiness: Secondary | ICD-10-CM | POA: Insufficient documentation

## 2012-01-21 DIAGNOSIS — J4489 Other specified chronic obstructive pulmonary disease: Secondary | ICD-10-CM | POA: Insufficient documentation

## 2012-01-21 DIAGNOSIS — Z8612 Personal history of poliomyelitis: Secondary | ICD-10-CM | POA: Insufficient documentation

## 2012-01-21 DIAGNOSIS — I10 Essential (primary) hypertension: Secondary | ICD-10-CM | POA: Insufficient documentation

## 2012-01-21 DIAGNOSIS — J449 Chronic obstructive pulmonary disease, unspecified: Secondary | ICD-10-CM | POA: Insufficient documentation

## 2012-01-21 DIAGNOSIS — S0990XA Unspecified injury of head, initial encounter: Secondary | ICD-10-CM | POA: Insufficient documentation

## 2012-01-21 DIAGNOSIS — S7000XA Contusion of unspecified hip, initial encounter: Secondary | ICD-10-CM | POA: Insufficient documentation

## 2012-01-21 DIAGNOSIS — M1990 Unspecified osteoarthritis, unspecified site: Secondary | ICD-10-CM | POA: Insufficient documentation

## 2012-01-21 LAB — BASIC METABOLIC PANEL
BUN: 6 mg/dL (ref 6–23)
CO2: 21 mEq/L (ref 19–32)
Chloride: 99 mEq/L (ref 96–112)
Creatinine, Ser: 0.92 mg/dL (ref 0.50–1.35)
GFR calc Af Amer: >90 mL/min (ref >90)
Glucose, Bld: 97 mg/dL (ref 70–99)

## 2012-01-21 LAB — DIFFERENTIAL
Basophils Relative: 1 % (ref 0–1)
Monocytes Absolute: 0.5 10*3/uL (ref 0.1–1.0)
Monocytes Relative: 7 % (ref 3–12)
Neutro Abs: 3.9 10*3/uL (ref 1.7–7.7)

## 2012-01-21 LAB — CBC
HCT: 39.8 % (ref 39.0–52.0)
Hemoglobin: 13.8 g/dL (ref 13.0–17.0)
MCHC: 34.7 g/dL (ref 30.0–36.0)

## 2012-01-21 MED ORDER — HYDROMORPHONE HCL PF 1 MG/ML IJ SOLN
1.0000 mg | Freq: Once | INTRAMUSCULAR | Status: AC
  Administered 2012-01-21: 1 mg via INTRAVENOUS
  Filled 2012-01-21: qty 1

## 2012-01-21 MED ORDER — ONDANSETRON HCL 4 MG/2ML IJ SOLN
4.0000 mg | Freq: Once | INTRAMUSCULAR | Status: AC
  Administered 2012-01-21: 4 mg via INTRAVENOUS
  Filled 2012-01-21: qty 2

## 2012-01-21 MED ORDER — SODIUM CHLORIDE 0.9 % IV SOLN: INTRAVENOUS | Status: DC

## 2012-01-21 MED ORDER — SODIUM CHLORIDE 0.9 % IV BOLUS (SEPSIS)
500.0000 mL | Freq: Once | INTRAVENOUS | Status: AC
  Administered 2012-01-21: 500 mL via INTRAVENOUS

## 2012-01-21 NOTE — ED Notes (Signed)
Per EMS, pt from home, was on a step stool, changing out a light fixture, was stepping off and lost his balance.  Pt reports he fell backwards.  Pt reports hitting his head against the dresser.  Pt reports low back pain and L hip pain.  Pt reports pending L hip replacement.  Pt denies LOC at this time.  Etoh on board.

## 2012-01-21 NOTE — ED Notes (Signed)
Pt's BG 127

## 2012-01-21 NOTE — Discharge Instructions (Signed)
X-rays show no fracture. Blood work and EKG were normal.

## 2012-01-21 NOTE — ED Notes (Signed)
Patient transported to X-ray. Will get labs when pt returns  

## 2012-01-22 MED ORDER — DIPHENHYDRAMINE HCL 50 MG/ML IJ SOLN
INTRAMUSCULAR | Status: AC
  Filled 2012-01-22: qty 1

## 2012-01-22 NOTE — ED Provider Notes (Signed)
History     CSN: [id number]  Arrival date & time 01/21/12  6295   First MD Initiated Contact with Patient 01/21/12 2021      Chief Complaint  Patient presents with  . Fall    L hip pain  . Hip Pain    (Consider location/radiation/quality/duration/timing/severity/associated sxs/prior treatment) HPI... accidental fall approximately 3 feet off a ladder at home. The lightheaded prior to fall. Hit head against a dresser. Planes of left hip pain.  He is pending a left total hip replacement.  No loss of consciousness or neurological deficits. Palpation makes pain worse. Nothing makes it better. Symptoms are minimal to moderate..  Past Medical History  Diagnosis Date  . COPD (chronic obstructive pulmonary disease)   . Hypertension   . Polio   . Bronchitis   . DJD (degenerative joint disease) of hip   . Angina pectoris     Past Surgical History  Procedure Date  . Rotator cuff repair     No family history on file.  History  Substance Use Topics  . Smoking status: Former Games developer  . Smokeless tobacco: Not on file  . Alcohol Use: Yes      Review of Systems  All other systems reviewed and are negative.    Allergies  Ivp dye; Penicillins; Aspirin; and Morphine and related  Home Medications   Current Outpatient Rx  Name Route Sig Dispense Refill  . ALBUTEROL SULFATE HFA 108 (90 BASE) MCG/ACT IN AERS Inhalation Inhale 2 puffs into the lungs 2 (two) times daily as needed. Shortness of breath.    Maximino Greenland 18-103 MCG/ACT IN AERO Inhalation Inhale 2 puffs into the lungs every 6 (six) hours as needed. For shortness of breath    . COLCHICINE 0.6 MG PO TABS Oral Take 0.6 mg by mouth daily.    Marland Kitchen DIAZEPAM 10 MG PO TABS Oral Take 10 mg by mouth at bedtime as needed. Leg spasms.    Marland Kitchen ESOMEPRAZOLE MAGNESIUM 40 MG PO CPDR Oral Take 40 mg by mouth 2 (two) times daily.    Marland Kitchen HYDROCODONE-ACETAMINOPHEN 5-500 MG PO CAPS Oral Take 1 capsule by mouth every 6 (six) hours as needed.  For pain    . ISOSORBIDE MONONITRATE ER 60 MG PO TB24 Oral Take 30 mg by mouth daily as needed. Chest pain.    Marland Kitchen LORATADINE 10 MG PO TABS Oral Take 10 mg by mouth daily.    Marland Kitchen METOCLOPRAMIDE HCL 10 MG PO TABS Oral Take by mouth 4 (four) times daily.     Marland Kitchen MIRABEGRON ER 50 MG PO TB24 Oral Take 1 tablet by mouth at bedtime.    . MOMETASONE FUROATE 220 MCG/INH IN AEPB Inhalation Inhale 1 puff into the lungs daily.    . MOMETASONE FURO-FORMOTEROL FUM 100-5 MCG/ACT IN AERO Inhalation Inhale 2 puffs into the lungs 2 (two) times daily.    Marland Kitchen NITROGLYCERIN 0.4 MG SL SUBL Sublingual Place 0.4 mg under the tongue every 5 (five) minutes as needed. For chest pain    . OXYCODONE-ACETAMINOPHEN 5-325 MG PO TABS Oral Take 1 tablet by mouth every 4 (four) hours as needed. Pain.    Marland Kitchen POTASSIUM CHLORIDE ER PO Oral Take 1 tablet by mouth daily.    . ROFLUMILAST 500 MCG PO TABS Oral Take 500 mcg by mouth daily.    Marland Kitchen TAMSULOSIN HCL 0.4 MG PO CAPS Oral Take 0.4 mg by mouth daily.    . TRIAMTERENE-HCTZ 75-50 MG PO TABS Oral Take 0.5 tablets  by mouth daily.    Marland Kitchen VERAPAMIL HCL ER 120 MG PO CP24 Oral Take 120 mg by mouth 2 (two) times daily.    Marland Kitchen MONTELUKAST SODIUM 10 MG PO TABS Oral Take 10 mg by mouth at bedtime.      BP 129/81  Pulse 84  Temp(Src) 98.1 F (36.7 C) (Oral)  Resp 16  SpO2 96%  Physical Exam  Nursing note and vitals reviewed. Constitutional: He is oriented to person, place, and time. He appears well-developed and well-nourished.  HENT:  Head: Normocephalic and atraumatic.       Slight occipital tenderness  Eyes: Conjunctivae and EOM are normal. Pupils are equal, round, and reactive to light.  Neck: Normal range of motion. Neck supple.       Minimal. Cervical tenderness  Cardiovascular: Normal rate and regular rhythm.   Pulmonary/Chest: Effort normal and breath sounds normal.  Abdominal: Soft. Bowel sounds are normal.  Musculoskeletal: Normal range of motion.       Tender left posterior  lateral hip  Neurological: He is alert and oriented to person, place, and time.  Skin: Skin is warm and dry.  Psychiatric: He has a normal mood and affect.    ED Course  Procedures (including critical care time)  Labs Reviewed  CBC - Abnormal; Notable for the following:    RBC 4.17 (*)    All other components within normal limits  BASIC METABOLIC PANEL - Abnormal; Notable for the following:    Sodium 134 (*)    GFR calc non Af Amer 89 (*)    All other components within normal limits  DIFFERENTIAL   Dg Hip Complete Left  01/21/2012  *RADIOLOGY REPORT*  Clinical Data: Status post fall left hip; left hip pain.  LEFT HIP - COMPLETE 2+ VIEW  Comparison: Left hip radiographs performed 12/05/2011  Findings: There is no evidence of fracture or dislocation.  Both femoral heads are seated normally within their respective acetabula.  The proximal left femur appears intact.  No significant degenerative change is appreciated.  The sacroiliac joints are unremarkable in appearance.  The visualized bowel gas pattern is grossly unremarkable in appearance.  IMPRESSION: No evidence of fracture or dislocation.  Original Report Authenticated By: Tonia Ghent, M.D.   Ct Head Wo Contrast  01/21/2012  *RADIOLOGY REPORT*  Clinical Data:  Fall.  CT HEAD WITHOUT CONTRAST CT CERVICAL SPINE WITHOUT CONTRAST  Technique:  Multidetector CT imaging of the head and cervical spine was performed following the standard protocol without intravenous contrast.  Multiplanar CT image reconstructions of the cervical spine were also generated.  Comparison:  02/10/2009 MR brain.  CT HEAD  Findings: No skull fracture or intracranial hemorrhage.  No CT evidence of large acute infarct.  No hydrocephalus. No intracranial mass lesion detected on this unenhanced exam.  IMPRESSION: No skull fracture or intracranial hemorrhage.  CT CERVICAL SPINE  Findings: Small amount of gas is seen along the left C7-T1 facet articulation where there are  associated degenerative changes without discrete fracture noted.  Cervical spondylotic changes including spinal stenosis and foraminal narrowing most notable on the left at the C4-5 level and on the right at the C5-6 level.  IMPRESSION: No cervical spine fracture.  Please see above.  Original Report Authenticated By: Fuller Canada, M.D.   Ct Cervical Spine Wo Contrast  01/21/2012  *RADIOLOGY REPORT*  Clinical Data:  Fall.  CT HEAD WITHOUT CONTRAST CT CERVICAL SPINE WITHOUT CONTRAST  Technique:  Multidetector CT imaging of the head  and cervical spine was performed following the standard protocol without intravenous contrast.  Multiplanar CT image reconstructions of the cervical spine were also generated.  Comparison:  02/10/2009 MR brain.  CT HEAD  Findings: No skull fracture or intracranial hemorrhage.  No CT evidence of large acute infarct.  No hydrocephalus. No intracranial mass lesion detected on this unenhanced exam.  IMPRESSION: No skull fracture or intracranial hemorrhage.  CT CERVICAL SPINE  Findings: Small amount of gas is seen along the left C7-T1 facet articulation where there are associated degenerative changes without discrete fracture noted.  Cervical spondylotic changes including spinal stenosis and foraminal narrowing most notable on the left at the C4-5 level and on the right at the C5-6 level.  IMPRESSION: No cervical spine fracture.  Please see above.  Original Report Authenticated By: Fuller Canada, M.D.     1. Fall   2. Minor head injury   3. Cervical strain   4. Contusion of left hip       MDM  Patient is alert and oriented. No neuro deficits. X-rays all normal. Labs normal. EKG no ectopy. Patient rechecked prior to discharge. Normal neuro exam        Donnetta Hutching, MD 01/22/12 1714

## 2013-07-10 ENCOUNTER — Emergency Department (HOSPITAL_COMMUNITY)
Admission: EM | Admit: 2013-07-10 | Discharge: 2013-07-10 | Disposition: A | Discharge status: Home / Self Care | Payer: Medicare Other | Source: Home / Self Care

## 2013-07-10 ENCOUNTER — Encounter (HOSPITAL_COMMUNITY): Payer: Self-pay | Admitting: Emergency Medicine

## 2013-07-10 DIAGNOSIS — M25819 Other specified joint disorders, unspecified shoulder: Secondary | ICD-10-CM

## 2013-07-10 DIAGNOSIS — M7542 Impingement syndrome of left shoulder: Secondary | ICD-10-CM

## 2013-07-10 MED ORDER — TRIAMCINOLONE ACETONIDE 40 MG/ML IJ SUSP
INTRAMUSCULAR | Status: AC
  Filled 2013-07-10: qty 1

## 2013-07-10 NOTE — ED Provider Notes (Signed)
CSN: [id number]     Arrival date & time 07/10/13  1837 History   First MD Initiated Contact with Patient 07/10/13 1920     Chief Complaint  Patient presents with  . Shoulder Pain    left shoulder pain x 1 month   (Consider location/radiation/quality/duration/timing/severity/associated sxs/prior Treatment) HPI Comments: 62 year old male with a history of bilateral rotator cuff injury and subsequent surgeries with 3 surgeries to the left shoulder. He is complaining of pain in the left shoulder particularly at the a.c. joint. This started approximately one month ago. He has narcotic analgesics but he does not like to take him and is requesting an injection. The last injection the head in the shoulder was 10-12 months ago. He states he is unable to raise his arm over his head due to the pain. 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 No known recent injury or trauma.   Past Medical History  Diagnosis Date  . COPD (chronic obstructive pulmonary disease)   . Hypertension   . Polio   . Bronchitis   . DJD (degenerative joint disease) of hip   . Angina pectoris    Past Surgical History  Procedure Laterality Date  . Rotator cuff repair     History reviewed. No pertinent family history. History  Substance Use Topics  . Smoking status: Former Games developer  . Smokeless tobacco: Not on file  . Alcohol Use: Yes    Review of Systems  Constitutional: Negative.   Respiratory: Negative.   Gastrointestinal: Negative.   Genitourinary: Negative.   Musculoskeletal:       As per HPI  Skin: Negative.   Neurological: Negative for dizziness, weakness, numbness and headaches.    Allergies  Ivp dye; Penicillins; Aspirin; and Morphine and related  Home Medications   Current Outpatient Rx  Name  Route  Sig  Dispense  Refill  . albuterol (PROVENTIL HFA;VENTOLIN HFA) 108 (90 BASE) MCG/ACT inhaler  Inhalation   Inhale 2 puffs into the lungs 2 (two) times daily as needed. Shortness of breath.         Marland Kitchen albuterol-ipratropium (COMBIVENT) 18-103 MCG/ACT inhaler   Inhalation   Inhale 2 puffs into the lungs every 6 (six) hours as needed. For shortness of breath         . colchicine 0.6 MG tablet   Oral   Take 0.6 mg by mouth daily.         . diazepam (VALIUM) 10 MG tablet   Oral   Take 10 mg by mouth at bedtime as needed. Leg spasms.         Marland Kitchen esomeprazole (NEXIUM) 40 MG capsule   Oral   Take 40 mg by mouth 2 (two) times daily.         . hydrocodone-acetaminophen (LORCET-HD) 5-500 MG per capsule   Oral   Take 1 capsule by mouth every 6 (six) hours as needed. For pain         . isosorbide mononitrate (IMDUR) 60 MG 24 hr tablet   Oral   Take 30 mg by mouth daily as needed. Chest pain.         Marland Kitchen loratadine (CLARITIN) 10 MG tablet   Oral   Take 10 mg by mouth daily.         . metoCLOPramide (REGLAN) 10 MG tablet   Oral   Take by mouth  4 (four) times daily.          . Mirabegron ER 50 MG TB24   Oral   Take 1 tablet by mouth at bedtime.         . mometasone-formoterol (DULERA) 100-5 MCG/ACT AERO   Inhalation   Inhale 2 puffs into the lungs 2 (two) times daily.         . montelukast (SINGULAIR) 10 MG tablet   Oral   Take 10 mg by mouth at bedtime.         . nitroGLYCERIN (NITROSTAT) 0.4 MG SL tablet   Sublingual   Place 0.4 mg under the tongue every 5 (five) minutes as needed. For chest pain         . oxyCODONE-acetaminophen (PERCOCET) 5-325 MG per tablet   Oral   Take 1 tablet by mouth every 4 (four) hours as needed. Pain.         Marland Kitchen POTASSIUM CHLORIDE ER PO   Oral   Take 1 tablet by mouth daily.         . roflumilast (DALIRESP) 500 MCG TABS tablet   Oral   Take 500 mcg by mouth daily.         . Tamsulosin HCl (FLOMAX) 0.4 MG CAPS   Oral   Take 0.4 mg by mouth daily.         Marland Kitchen triamterene-hydrochlorothiazide (MAXZIDE) 75-50  MG per tablet   Oral   Take 0.5 tablets by mouth daily.         . verapamil (VERELAN PM) 120 MG 24 hr capsule   Oral   Take 120 mg by mouth 2 (two) times daily.         . mometasone (ASMANEX) 220 MCG/INH inhaler   Inhalation   Inhale 1 puff into the lungs daily.          BP 126/82  Pulse 64  Temp(Src) 97.5 F (36.4 C) (Oral)  Resp 16  SpO2 100% Physical Exam  Constitutional: He is oriented to person, place, and time. He appears well-developed and well-nourished.  HENT:  Head: Normocephalic and atraumatic.  Eyes: EOM are normal. Left eye exhibits no discharge.  Neck: Normal range of motion. Neck supple.  Pulmonary/Chest: Effort normal. No respiratory distress.  Musculoskeletal:  Able to abduct the left arm to 90 only. Tenderness located at the a.c. joint. Denies deltoid tenderness. No swelling, erythema or increased warmth. No signs of infection. Distal neurovascular motor sensory is intact in the left upper extremity.  Neurological: He is alert and oriented to person, place, and time. No cranial nerve deficit.  Skin: Skin is warm and dry.  Psychiatric: He has a normal mood and affect.    ED Course  Injection of joint Date/Time: 07/10/2013 8:00 PM Performed by: Phineas Real, Genia Perin Authorized by: Leslee Home C Consent: Verbal consent obtained. Risks and benefits: risks, benefits and alternatives were discussed Consent given by: patient Patient understanding: patient states understanding of the procedure being performed Patient identity confirmed: verbally with patient Local anesthesia used: yes Anesthesia: local infiltration Local anesthetic: bupivacaine 0.5% without epinephrine Anesthetic total: 2 ml Patient sedated: no Comments: Injection administered beneath the left a.c. joint. Kenalog 40 mg and bupivacaine 2 cc.   (including critical care time) Labs Review Labs Reviewed - No data to display Imaging Review No results found.  MDM   1. Impingement syndrome of  left shoulder      Follow with your PCP or orthopedist as soon as she can.  Injection of Marcaine and Kenalog to the left shoulder To take your prescribed medication as needed at nighttime for pain.  Hayden Rasmussen, NP 07/10/13 2018  Hayden Rasmussen, NP 07/10/13 2059

## 2013-07-10 NOTE — ED Notes (Signed)
C/o left shoulder pain x 1 month. Hx of 3 surgeries. States pain worse at night not able to sleep. Gradually getting worse. Pt has used tylenol and biofreeze with no relief.

## 2013-07-10 NOTE — ED Provider Notes (Signed)
Medical screening examination/treatment/procedure(s) were performed by non-physician practitioner and as supervising physician I was immediately available for consultation/collaboration.  Leslee Home, M.D.  Reuben Likes, MD 07/10/13 2130

## 2013-08-10 DIAGNOSIS — G4734 Idiopathic sleep related nonobstructive alveolar hypoventilation: Secondary | ICD-10-CM | POA: Insufficient documentation

## 2013-08-14 DIAGNOSIS — K2920 Alcoholic gastritis without bleeding: Secondary | ICD-10-CM | POA: Insufficient documentation

## 2013-11-23 ENCOUNTER — Institutional Professional Consult (permissible substitution): Payer: Medicare Other | Admitting: Internal Medicine

## 2013-11-24 ENCOUNTER — Encounter: Payer: Self-pay | Admitting: Pulmonary Disease

## 2014-01-13 ENCOUNTER — Encounter: Payer: Self-pay | Admitting: Physician Assistant

## 2014-01-13 ENCOUNTER — Telehealth: Payer: Self-pay | Admitting: Physician Assistant

## 2014-01-13 DIAGNOSIS — Z8611 Personal history of tuberculosis: Secondary | ICD-10-CM | POA: Insufficient documentation

## 2014-01-13 DIAGNOSIS — N200 Calculus of kidney: Secondary | ICD-10-CM | POA: Insufficient documentation

## 2014-01-13 DIAGNOSIS — G4733 Obstructive sleep apnea (adult) (pediatric): Secondary | ICD-10-CM

## 2014-01-13 DIAGNOSIS — J449 Chronic obstructive pulmonary disease, unspecified: Secondary | ICD-10-CM

## 2014-01-13 DIAGNOSIS — G14 Postpolio syndrome: Secondary | ICD-10-CM | POA: Insufficient documentation

## 2014-01-13 DIAGNOSIS — Z87891 Personal history of nicotine dependence: Secondary | ICD-10-CM

## 2014-01-13 DIAGNOSIS — J438 Other emphysema: Secondary | ICD-10-CM | POA: Insufficient documentation

## 2014-01-13 DIAGNOSIS — G8929 Other chronic pain: Secondary | ICD-10-CM | POA: Insufficient documentation

## 2014-01-13 DIAGNOSIS — C61 Malignant neoplasm of prostate: Secondary | ICD-10-CM | POA: Insufficient documentation

## 2014-01-13 DIAGNOSIS — F319 Bipolar disorder, unspecified: Secondary | ICD-10-CM | POA: Insufficient documentation

## 2014-01-13 DIAGNOSIS — F603 Borderline personality disorder: Secondary | ICD-10-CM | POA: Insufficient documentation

## 2014-01-13 DIAGNOSIS — N529 Male erectile dysfunction, unspecified: Secondary | ICD-10-CM | POA: Insufficient documentation

## 2014-01-13 DIAGNOSIS — K219 Gastro-esophageal reflux disease without esophagitis: Secondary | ICD-10-CM | POA: Insufficient documentation

## 2014-01-13 DIAGNOSIS — I10 Essential (primary) hypertension: Secondary | ICD-10-CM | POA: Insufficient documentation

## 2014-01-13 HISTORY — DX: Obstructive sleep apnea (adult) (pediatric): G47.33

## 2014-01-13 HISTORY — DX: Personal history of nicotine dependence: Z87.891

## 2014-01-13 HISTORY — DX: Chronic obstructive pulmonary disease, unspecified: J44.9

## 2014-01-13 NOTE — Telephone Encounter (Signed)
Received medical records from [facility]

## 2014-01-14 ENCOUNTER — Ambulatory Visit (INDEPENDENT_AMBULATORY_CARE_PROVIDER_SITE_OTHER): Payer: Medicare Other | Admitting: Physician Assistant

## 2014-01-14 ENCOUNTER — Encounter: Payer: Self-pay | Admitting: Physician Assistant

## 2014-01-14 VITALS — BP 132/78 | HR 68 | Temp 98.3°F | Resp 18 | Ht 72.0 in | Wt 209.8 lb

## 2014-01-14 DIAGNOSIS — R5383 Other fatigue: Principal | ICD-10-CM

## 2014-01-14 DIAGNOSIS — C61 Malignant neoplasm of prostate: Secondary | ICD-10-CM

## 2014-01-14 DIAGNOSIS — I10 Essential (primary) hypertension: Secondary | ICD-10-CM

## 2014-01-14 DIAGNOSIS — R5381 Other malaise: Secondary | ICD-10-CM

## 2014-01-14 DIAGNOSIS — F319 Bipolar disorder, unspecified: Secondary | ICD-10-CM

## 2014-01-14 DIAGNOSIS — J449 Chronic obstructive pulmonary disease, unspecified: Secondary | ICD-10-CM

## 2014-01-14 DIAGNOSIS — N529 Male erectile dysfunction, unspecified: Secondary | ICD-10-CM

## 2014-01-14 DIAGNOSIS — K219 Gastro-esophageal reflux disease without esophagitis: Secondary | ICD-10-CM

## 2014-01-14 LAB — HEPATIC FUNCTION PANEL
ALK PHOS: 58 U/L (ref 39–117)
ALT: 35 U/L (ref 0–53)
AST: 20 U/L (ref 0–37)
Albumin: 4.2 g/dL (ref 3.5–5.2)
BILIRUBIN DIRECT: 0.1 mg/dL (ref 0.0–0.3)
BILIRUBIN INDIRECT: 0.5 mg/dL (ref 0.2–1.2)
BILIRUBIN TOTAL: 0.6 mg/dL (ref 0.2–1.2)
Total Protein: 6.5 g/dL (ref 6.0–8.3)

## 2014-01-14 LAB — CBC WITH DIFFERENTIAL/PLATELET
Basophils Absolute: 0.1 10*3/uL (ref 0.0–0.1)
Basophils Relative: 1 % (ref 0–1)
Eosinophils Absolute: 0.2 10*3/uL (ref 0.0–0.7)
Eosinophils Relative: 4 % (ref 0–5)
HCT: 42.8 % (ref 39.0–52.0)
HEMOGLOBIN: 14.7 g/dL (ref 13.0–17.0)
LYMPHS ABS: 1.6 10*3/uL (ref 0.7–4.0)
LYMPHS PCT: 27 % (ref 12–46)
MCH: 31.8 pg (ref 26.0–34.0)
MCHC: 34.3 g/dL (ref 30.0–36.0)
MCV: 92.6 fL (ref 78.0–100.0)
MONOS PCT: 11 % (ref 3–12)
Monocytes Absolute: 0.6 10*3/uL (ref 0.1–1.0)
NEUTROS ABS: 3.4 10*3/uL (ref 1.7–7.7)
NEUTROS PCT: 57 % (ref 43–77)
PLATELETS: 278 10*3/uL (ref 150–400)
RBC: 4.62 MIL/uL (ref 4.22–5.81)
RDW: 14 % (ref 11.5–15.5)
WBC: 5.9 10*3/uL (ref 4.0–10.5)

## 2014-01-14 LAB — LIPID PANEL
Cholesterol: 214 mg/dL — ABNORMAL HIGH (ref 0–200)
HDL: 71 mg/dL (ref >39)
LDL CALC: 93 mg/dL (ref 0–99)
TRIGLYCERIDES: 251 mg/dL — AB (ref <150)
Total CHOL/HDL Ratio: 3 Ratio
VLDL: 50 mg/dL — ABNORMAL HIGH (ref 0–40)

## 2014-01-14 LAB — BASIC METABOLIC PANEL WITH GFR
BUN: 16 mg/dL (ref 6–23)
CALCIUM: 9.3 mg/dL (ref 8.4–10.5)
CHLORIDE: 103 meq/L (ref 96–112)
CO2: 27 meq/L (ref 19–32)
Creat: 1.05 mg/dL (ref 0.50–1.35)
GFR, Est African American: 86 mL/min
GFR, Est Non African American: 75 mL/min
Glucose, Bld: 100 mg/dL — ABNORMAL HIGH (ref 70–99)
POTASSIUM: 4.5 meq/L (ref 3.5–5.3)
SODIUM: 137 meq/L (ref 135–145)

## 2014-01-14 LAB — TSH: TSH: 1.821 u[IU]/mL (ref 0.350–4.500)

## 2014-01-14 LAB — T4, FREE: Free T4: 1.21 ng/dL (ref 0.80–1.80)

## 2014-01-14 NOTE — Patient Instructions (Signed)
Please obtain labs.  I will call you with your results.  Please continue medications as directed.  We will consider adding an anti-depressant if workup is unremarkable.  Giving your history, I do think input from a psychiatrist is warranted.  Please follow-up with Dr. Amalia Hailey.  We will schedule follow-up based on your lab results.

## 2014-01-14 NOTE — Progress Notes (Signed)
Patient presents to clinic today to establish care.  Acute Concerns: Patient with history of irritability and explosive personality disorder.  Patient also with documented history of bipolar disorder.  Patient not currently on any medications.  Patient does endorse some depressed mood and anhedonia.  Also endorses irritability.  Patient denies SI/HI.  Patient endorses trying numerous medications in the past including multiple SSRI, SNRI, TCAs, lithium etc.  Patient currently without follow-up by Psychiatry.  Denies hx of thyroid abnormality.  Chronic Issues: Hx Alcohol Abuse -- patient currently at 4 beers/day.  Is down from a case of beer per day for 10-20 years.  Patient does not wish to see AA.  His wife limits his alcohol consumption.  Denies history of cirrhosis or abnormal liver function.  Hypertension -- Patient currently on Norvasc 10 mg and Losartan 50 mg daily. BP normotensive in clinic.  Denies palpitations, vision changes, headache, LH, dizziness.  Denies chest pain.  Has history of atypical chest pain for which he was given SL nitrostat.  Has not used.  Seasonal Allergies -- Symptoms controlled with Singulair, Claritin and Flonase.    COPD -- Patient unsure of staging.  Will need to obtain prior medical records.  Patient with Combivent, Proventil, and Singulair.   Erectile Dysfunction -- Patient followed by [facility], Dr. Butch Penny in [place], [place].  Prostate Cancer -- Unsure of staging. Has upcoming surgery. Is followed by Dr. Amalia Hailey with [facility].  Health Maintenance: Dental -- Overdue; wears dentures Vision -- Overdue Immunizations -- Endorses flu shot. Tetanus in 2012.  Pneumovax in 2013.  Zostavax in 2014. Colonoscopy -- Endorses last colonoscopy in 2009; no abnormal findings.  Due in 2019.  Also had EGD at same time.  Past Medical History  Diagnosis Date  . COPD (chronic obstructive pulmonary disease)   . Hypertension   . Polio   . Bronchitis   .  DJD (degenerative joint disease) of hip   . Angina pectoris   . Tuberculosis   . Prostate cancer   . Chicken pox   . Rheumatic fever   . Measles   . Mumps   . GERD (gastroesophageal reflux disease)   . Alcoholism     Past Surgical History  Procedure Laterality Date  . Rotator cuff repair    . Other surgical history       Muscle & bone Graft/Polio  . Cholecystectomy    . Nose surgery    . Nasal septum surgery    . Kidney stone surgery    . Hand surgery    . Shoulder surgery      Arthritis & Bone Spurs  . Dental surgery      Current Outpatient Prescriptions on File Prior to Visit  Medication Sig Dispense Refill  . albuterol-ipratropium (COMBIVENT) 18-103 MCG/ACT inhaler Inhale 2 puffs into the lungs every 6 (six) hours as needed. For shortness of breath      . loratadine (CLARITIN) 10 MG tablet Take 10 mg by mouth daily.      . montelukast (SINGULAIR) 10 MG tablet Take 10 mg by mouth at bedtime.      . nitroGLYCERIN (NITROSTAT) 0.4 MG SL tablet Place 0.4 mg under the tongue every 5 (five) minutes as needed. For chest pain       No current facility-administered medications on file prior to visit.    Allergies  Allergen Reactions  . Ivp Dye [Iodinated Diagnostic Agents] Anaphylaxis  . Penicillins Anaphylaxis    Heart stops  .  Aspirin Other (See Comments)    Reaction unknown  . Morphine And Related Nausea And Vomiting  . Nsaids Other (See Comments)    Family History  Problem Relation Age of Onset  . Alzheimer's disease Father 4    Deceased  . Stomach cancer Father   . Heart attack Father   . Heart disease Father   . Skin cancer Mother     Facial-Living  . Alcohol abuse Sister     x2  . Mental illness Sister     x2  . Diabetes Maternal Aunt     x2  . Thyroid disease Maternal Aunt     x4  . Diabetes Maternal Uncle   . Tuberculosis Paternal Grandfather   . Tuberculosis Paternal Grandmother   . Alzheimer's disease Paternal Aunt   . Alzheimer's disease  Paternal Uncle     History   Social History  . Marital Status: Married    Spouse Name: N/A    Number of Children: N/A  . Years of Education: N/A   Occupational History  . Not on file.   Social History Main Topics  . Smoking status: Former Research scientist (life sciences)  . Smokeless tobacco: Not on file  . Alcohol Use: Yes  . Drug Use: No  . Sexual Activity: Not Currently   Other Topics Concern  . Not on file   Social History Narrative  . No narrative on file   Review of Systems  Constitutional: Negative for fever and weight loss.  HENT: Negative for ear discharge, ear pain, hearing loss and tinnitus.   Eyes: Negative for blurred vision, double vision, photophobia and pain.  Respiratory: Negative for cough, shortness of breath and wheezing.   Cardiovascular: Negative for chest pain and palpitations.  Gastrointestinal: Positive for heartburn. Negative for nausea, vomiting, abdominal pain, diarrhea, constipation, blood in stool and melena.  Genitourinary: Negative for dysuria, urgency, frequency, hematuria and flank pain.  Neurological: Negative for headaches.  Psychiatric/Behavioral: Negative for depression, suicidal ideas and substance abuse.   BP 132/78  Pulse 68  Temp(Src) 98.3 F (36.8 C) (Oral)  Resp 18  Ht 6' (1.829 m)  Wt 209 lb 12 oz (95.142 kg)  BMI 28.44 kg/m2  SpO2 98%  Physical Exam  Vitals reviewed. Constitutional: He is oriented to person, place, and time and well-developed, well-nourished, and in no distress.  HENT:  Head: Normocephalic and atraumatic.  Right Ear: External ear normal.  Left Ear: External ear normal.  Nose: Nose normal.  Mouth/Throat: Oropharynx is clear and moist. No oropharyngeal exudate.  TM within normal limits.  Eyes: Conjunctivae are normal. Pupils are equal, round, and reactive to light.  Neck: Neck supple.  Cardiovascular: Normal rate, regular rhythm, normal heart sounds and intact distal pulses.   Pulmonary/Chest: Effort normal and breath  sounds normal. No respiratory distress. He has no wheezes. He has no rales. He exhibits no tenderness.  Abdominal: Soft. Bowel sounds are normal. He exhibits no distension and no mass. There is no tenderness. There is no rebound and no guarding.  Lymphadenopathy:    He has no cervical adenopathy.  Neurological: He is alert and oriented to person, place, and time.  Skin: Skin is warm and dry. No rash noted.  Psychiatric: Affect normal.   Assessment/Plan: Hypertension BP normotensive.  Asymptomatic.  Continue current regimen.  Will obtain records from previous PCP.  COPD (chronic obstructive pulmonary disease) Continue current regimen.  GERD (gastroesophageal reflux disease) Refill Zantac 300.  Avoid late-night eating.  Elevate HOB.  Avoid trigger foods.  Will need further workup if symptoms begin to become uncontrolled.  Prostate cancer Followed by Urology.  Has upcoming surgery with Dr. Amalia Hailey at [facility].  Erectile dysfunction Followed by Urology.   Bipolar disorder Will obtain labs to include TSH/T4 due to fatigue and depressed mood.  If unremarkable, will need referral to Psychiatry giving significant history of explosive personality and subtherapeutic response to multiple medications.  Other malaise and fatigue Likely associated with bipolar disorder.  Will obtain labs to include TFTs, B12, Vitamin D and Folate levels.

## 2014-01-15 LAB — URINALYSIS, ROUTINE W REFLEX MICROSCOPIC
Bilirubin Urine: NEGATIVE
Glucose, UA: NEGATIVE mg/dL
Hgb urine dipstick: NEGATIVE
KETONES UR: NEGATIVE mg/dL
LEUKOCYTES UA: NEGATIVE
NITRITE: NEGATIVE
PH: 5.5 (ref 5.0–8.0)
Protein, ur: NEGATIVE mg/dL
SPECIFIC GRAVITY, URINE: 1.017 (ref 1.005–1.030)
Urobilinogen, UA: 0.2 mg/dL (ref 0.0–1.0)

## 2014-01-18 ENCOUNTER — Telehealth: Payer: Self-pay | Admitting: Physician Assistant

## 2014-01-18 DIAGNOSIS — R5381 Other malaise: Secondary | ICD-10-CM | POA: Insufficient documentation

## 2014-01-18 DIAGNOSIS — R5383 Other fatigue: Principal | ICD-10-CM

## 2014-01-18 LAB — METHYLMALONIC ACID, SERUM: METHYLMALONIC ACID, QUANT: 0.47 umol/L — AB (ref <0.40)

## 2014-01-18 NOTE — Assessment & Plan Note (Signed)
Likely associated with bipolar disorder.  Will obtain labs to include TFTs, B12, Vitamin D and Folate levels.

## 2014-01-18 NOTE — Assessment & Plan Note (Signed)
Refill Zantac 300.  Avoid late-night eating.  Elevate HOB.  Avoid trigger foods.  Will need further workup if symptoms begin to become uncontrolled.

## 2014-01-18 NOTE — Assessment & Plan Note (Signed)
Followed by Urology 

## 2014-01-18 NOTE — Telephone Encounter (Signed)
Relevant patient education mailed to patient.  

## 2014-01-18 NOTE — Assessment & Plan Note (Signed)
Followed by Urology.  Has upcoming surgery with Dr. Amalia Hailey at [facility].

## 2014-01-18 NOTE — Assessment & Plan Note (Signed)
Will obtain labs to include TSH/T4 due to fatigue and depressed mood.  If unremarkable, will need referral to Psychiatry giving significant history of explosive personality and subtherapeutic response to multiple medications.

## 2014-01-18 NOTE — Assessment & Plan Note (Signed)
Continue current regimen

## 2014-01-18 NOTE — Assessment & Plan Note (Signed)
BP normotensive.  Asymptomatic.  Continue current regimen.  Will obtain records from previous PCP.

## 2014-01-19 ENCOUNTER — Telehealth: Payer: Self-pay | Admitting: *Deleted

## 2014-01-19 DIAGNOSIS — Z8659 Personal history of other mental and behavioral disorders: Secondary | ICD-10-CM

## 2014-01-19 DIAGNOSIS — F603 Borderline personality disorder: Secondary | ICD-10-CM

## 2014-01-19 NOTE — Telephone Encounter (Signed)
Message copied by Chaya Jan on Tue Jan 19, 2014  3:52 PM ------      Message from: Raiford Noble      Created: Mon Jan 18, 2014  2:45 PM       So far labs look good.  Triglycerides are elevated, likely due to his 4 beers per day.  I would recommend he cut back on the alcohol consumption. Consider taking a daily fish oil supplement.  Still waiting on results of labs for vitamin deficiency.  Should have those by tomorrow.  Will call him with those results and further instructions concerning his mood. ------

## 2014-01-19 NOTE — Telephone Encounter (Signed)
Called and left message for patient to please return call. JG//CMA 

## 2014-01-20 LAB — VITAMIN D 1,25 DIHYDROXY
VITAMIN D3 1, 25 (OH): 26 pg/mL
Vitamin D 1, 25 (OH)2 Total: 26 pg/mL (ref 18–72)

## 2014-01-20 NOTE — Telephone Encounter (Signed)
All other labs look good.  Giving patient's significant history, I feel he would be best seen by psychiatry for medication selection for his current symptoms, since there is not a vitamin deficiency or thyroid abnormality contributing to his symptoms.  I have placed a referral.  He will be contacted by the specialists office.

## 2014-01-21 NOTE — Telephone Encounter (Signed)
LMOM with contact name and number for return call RE: results and further provider instructions/SLS  

## 2014-01-22 NOTE — Telephone Encounter (Signed)
Patient informed, understood & agreed/SLS  

## 2014-01-25 ENCOUNTER — Other Ambulatory Visit: Payer: Self-pay | Admitting: Physician Assistant

## 2014-02-05 ENCOUNTER — Ambulatory Visit: Payer: Self-pay | Admitting: Physician Assistant

## 2014-02-19 ENCOUNTER — Ambulatory Visit: Payer: Self-pay | Admitting: Physician Assistant

## 2014-02-22 ENCOUNTER — Encounter: Payer: Self-pay | Admitting: Physician Assistant

## 2014-02-22 ENCOUNTER — Ambulatory Visit (INDEPENDENT_AMBULATORY_CARE_PROVIDER_SITE_OTHER): Payer: Medicare HMO | Admitting: Physician Assistant

## 2014-02-22 VITALS — BP 128/63 | HR 90 | Temp 98.3°F | Resp 18 | Ht 72.0 in | Wt 210.0 lb

## 2014-02-22 DIAGNOSIS — J441 Chronic obstructive pulmonary disease with (acute) exacerbation: Secondary | ICD-10-CM

## 2014-02-22 DIAGNOSIS — K219 Gastro-esophageal reflux disease without esophagitis: Secondary | ICD-10-CM

## 2014-02-22 DIAGNOSIS — M25569 Pain in unspecified knee: Secondary | ICD-10-CM

## 2014-02-22 DIAGNOSIS — G8929 Other chronic pain: Secondary | ICD-10-CM

## 2014-02-22 MED ORDER — PREDNISONE 20 MG PO TABS: 40.0000 mg | ORAL_TABLET | Freq: Every day | ORAL | Status: DC

## 2014-02-22 MED ORDER — HYDROCODONE-ACETAMINOPHEN 10-325 MG PO TABS: 1.0000 | ORAL_TABLET | Freq: Three times a day (TID) | ORAL | Status: DC | PRN

## 2014-02-22 MED ORDER — HYDROCODONE-ACETAMINOPHEN 7.5-325 MG/15ML PO SOLN: 15.0000 mL | Freq: Four times a day (QID) | ORAL | Status: DC | PRN

## 2014-02-22 MED ORDER — OMEPRAZOLE 40 MG PO CPDR: 40.0000 mg | DELAYED_RELEASE_CAPSULE | Freq: Every day | ORAL | Status: DC

## 2014-02-22 NOTE — Progress Notes (Signed)
Patient presents to clinic today with multiple complaints.  Patient endorses being diagnosed with acute bronchitis by his Pulmonologist. Was given Rx for Defdinir and doxycycline.  Patient endorses improvement in cough but still having chest tightness and wheezing.  Has been using inhalers as directed.  Denies fever, chill, pleuritic chest pain or severe SOB.  Denies recent travel or sick contact.  Patient also endorses continual acid reflux symptoms despite daily ranitidine.  Patient denies ever being on PPI therapy.  Patient denies nausea, emesis, dysphagia or abdominal pain.  Patient endorses chronic aching of bilateral lower extremities over the past 5-6 months.  Denies trauma or injury.  Has history of Polio affecting his RLE.  Endorses aching pain of knees and legs.  Denies foot/ankle pain.  Denies swelling.  Denies numbness, tingling or weakness.    Past Medical History  Diagnosis Date  . COPD (chronic obstructive pulmonary disease)   . Hypertension   . Polio   . Bronchitis   . DJD (degenerative joint disease) of hip   . Angina pectoris   . Tuberculosis   . Prostate cancer   . Chicken pox   . Rheumatic fever   . Measles   . Mumps   . GERD (gastroesophageal reflux disease)   . Alcoholism     Current Outpatient Prescriptions on File Prior to Visit  Medication Sig Dispense Refill  . albuterol (PROVENTIL HFA;VENTOLIN HFA) 108 (90 BASE) MCG/ACT inhaler Inhale into the lungs every 6 (six) hours as needed for wheezing or shortness of breath. PROAIR      . albuterol (PROVENTIL) (2.5 MG/3ML) 0.083% nebulizer solution Take 2.5 mg by nebulization every 6 (six) hours as needed for wheezing or shortness of breath.      Marland Kitchen albuterol-ipratropium (COMBIVENT) 18-103 MCG/ACT inhaler Inhale 2 puffs into the lungs every 6 (six) hours as needed. For shortness of breath      . amLODipine (NORVASC) 10 MG tablet Take 10 mg by mouth daily.      Marland Kitchen EPINEPHrine (EPIPEN) 0.3 mg/0.3 mL SOAJ injection Inject  0.3 mg into the muscle once.      . Fluticasone Furoate-Vilanterol (BREO ELLIPTA) 100-25 MCG/INH AEPB Inhale 1 puff into the lungs every morning.      . loratadine (CLARITIN) 10 MG tablet TAKE 1 TABLET BY MOUTH EVERY DAY  30 tablet  0  . losartan (COZAAR) 50 MG tablet Take 50 mg by mouth daily.      . montelukast (SINGULAIR) 10 MG tablet Take 10 mg by mouth at bedtime.      . nitroGLYCERIN (NITROSTAT) 0.4 MG SL tablet Place 0.4 mg under the tongue every 5 (five) minutes as needed. For chest pain      . ranitidine (ZANTAC) 300 MG tablet Take 150 mg by mouth 2 (two) times daily.       No current facility-administered medications on file prior to visit.    Allergies  Allergen Reactions  . Ivp Dye [Iodinated Diagnostic Agents] Anaphylaxis  . Penicillins Anaphylaxis    Heart stops  . Aspirin Other (See Comments)    Reaction unknown  . Morphine And Related Nausea And Vomiting  . Nsaids Other (See Comments)    Family History  Problem Relation Age of Onset  . Alzheimer's disease Father 4    Deceased  . Stomach cancer Father   . Heart attack Father   . Heart disease Father   . Skin cancer Mother     Facial-Living  . Alcohol abuse Sister  x2  . Mental illness Sister     x2  . Diabetes Maternal Aunt     x2  . Thyroid disease Maternal Aunt     x4  . Diabetes Maternal Uncle   . Tuberculosis Paternal Grandfather   . Tuberculosis Paternal Grandmother   . Alzheimer's disease Paternal Aunt   . Alzheimer's disease Paternal Uncle     History   Social History  . Marital Status: Married    Spouse Name: N/A    Number of Children: N/A  . Years of Education: N/A   Social History Main Topics  . Smoking status: Former Research scientist (life sciences)  . Smokeless tobacco: None  . Alcohol Use: Yes  . Drug Use: No  . Sexual Activity: Not Currently   Other Topics Concern  . None   Social History Narrative  . None    Review of Systems - See HPI.  All other ROS are negative.  BP 128/63  Pulse 90   Temp(Src) 98.3 F (36.8 C) (Oral)  Resp 18  Ht 6' (1.829 m)  Wt 210 lb (95.255 kg)  BMI 28.47 kg/m2  SpO2 97%  Physical Exam  Vitals reviewed. Constitutional: He is oriented to person, place, and time and well-developed, well-nourished, and in no distress.  HENT:  Head: Normocephalic and atraumatic.  Right Ear: External ear normal.  Left Ear: External ear normal.  Nose: Nose normal.  Mouth/Throat: Oropharynx is clear and moist.  TM within normal limits bilaterally.  Eyes: Conjunctivae and EOM are normal. Pupils are equal, round, and reactive to light.  Neck: Neck supple.  Cardiovascular: Normal rate, regular rhythm, normal heart sounds and intact distal pulses.   Pulmonary/Chest: Effort normal. No respiratory distress. He has wheezes. He has no rales. He exhibits no tenderness.  Abdominal: Soft. Bowel sounds are normal. He exhibits no distension and no mass. There is no tenderness. There is no rebound and no guarding.  Musculoskeletal:       Right knee: He exhibits bony tenderness. He exhibits normal range of motion, no swelling, no effusion, no ecchymosis, no deformity, no laceration and no erythema. Tenderness found.       Left knee: He exhibits bony tenderness. He exhibits normal range of motion, no swelling, no effusion, no ecchymosis, no deformity, no laceration and no erythema. Tenderness found.       Right ankle: Normal.       Left ankle: Normal.       Right lower leg: Normal.       Left lower leg: Normal.  Neurological: He is alert and oriented to person, place, and time.  Skin: Skin is warm and dry. No rash noted.  Psychiatric: Affect normal.    Recent Results (from the past 2160 hour(s))  CBC WITH DIFFERENTIAL     Status: None   Collection Time    01/14/14 10:46 AM      Result Value Ref Range   WBC 5.9  4.0 - 10.5 K/uL   RBC 4.62  4.22 - 5.81 MIL/uL   Hemoglobin 14.7  13.0 - 17.0 g/dL   HCT 42.8  39.0 - 52.0 %   MCV 92.6  78.0 - 100.0 fL   MCH 31.8  26.0 - 34.0  pg   MCHC 34.3  30.0 - 36.0 g/dL   RDW 14.0  11.5 - 15.5 %   Platelets 278  150 - 400 K/uL   Neutrophils Relative % 57  43 - 77 %   Neutro Abs 3.4  1.7 -  7.7 K/uL   Lymphocytes Relative 27  12 - 46 %   Lymphs Abs 1.6  0.7 - 4.0 K/uL   Monocytes Relative 11  3 - 12 %   Monocytes Absolute 0.6  0.1 - 1.0 K/uL   Eosinophils Relative 4  0 - 5 %   Eosinophils Absolute 0.2  0.0 - 0.7 K/uL   Basophils Relative 1  0 - 1 %   Basophils Absolute 0.1  0.0 - 0.1 K/uL   Smear Review Criteria for review not met    BASIC METABOLIC PANEL WITH GFR     Status: Abnormal   Collection Time    01/14/14 10:46 AM      Result Value Ref Range   Sodium 137  135 - 145 mEq/L   Potassium 4.5  3.5 - 5.3 mEq/L   Chloride 103  96 - 112 mEq/L   CO2 27  19 - 32 mEq/L   Glucose, Bld 100 (*) 70 - 99 mg/dL   BUN 16  6 - 23 mg/dL   Creat 1.05  0.50 - 1.35 mg/dL   Calcium 9.3  8.4 - 10.5 mg/dL   GFR, Est African American 86     GFR, Est Non African American 75     Comment:       The estimated GFR is a calculation valid for adults (>=53 years old)     that uses the CKD-EPI algorithm to adjust for age and sex. It is       not to be used for children, pregnant women, hospitalized patients,        patients on dialysis, or with rapidly changing kidney function.     According to the NKDEP, eGFR >89 is normal, 60-89 shows mild     impairment, 30-59 shows moderate impairment, 15-29 shows severe     impairment and <15 is ESRD.        HEPATIC FUNCTION PANEL     Status: None   Collection Time    01/14/14 10:46 AM      Result Value Ref Range   Total Bilirubin 0.6  0.2 - 1.2 mg/dL   Bilirubin, Direct 0.1  0.0 - 0.3 mg/dL   Indirect Bilirubin 0.5  0.2 - 1.2 mg/dL   Alkaline Phosphatase 58  39 - 117 U/L   AST 20  0 - 37 U/L   ALT 35  0 - 53 U/L   Total Protein 6.5  6.0 - 8.3 g/dL   Albumin 4.2  3.5 - 5.2 g/dL  TSH     Status: None   Collection Time    01/14/14 10:46 AM      Result Value Ref Range   TSH 1.821  0.350 -  4.500 uIU/mL  T4, FREE     Status: None   Collection Time    01/14/14 10:46 AM      Result Value Ref Range   Free T4 1.21  0.80 - 1.80 ng/dL  VITAMIN D 1,25 DIHYDROXY     Status: None   Collection Time    01/14/14 10:46 AM      Result Value Ref Range   Vitamin D 1, 25 (OH)2 Total 26  18 - 72 pg/mL   Vitamin D3 1, 25 (OH)2 26     Vitamin D2 1, 25 (OH)2 <8     Comment: Vitamin D3, 1,25(OH)2 indicates both endogenous     production and supplementation.  Vitamin D2, 1,25(OH)2     is an indicator  of exogeous sources, such as diet or     supplementation.  Interpretation and therapy are based     on measurement of Vitamin D,1,25(OH)2, Total.     This test was developed and its performance     characteristics have been determined by [facility], [place], [place].     Performance characteristics refer to the     analytical performance of the test.  METHYLMALONIC ACID, SERUM     Status: Abnormal   Collection Time    01/14/14 10:46 AM      Result Value Ref Range   Methylmalonic Acid, Quant 0.47 (*) <0.40 umol/L  LIPID PANEL     Status: Abnormal   Collection Time    01/14/14 10:46 AM      Result Value Ref Range   Cholesterol 214 (*) 0 - 200 mg/dL   Comment: ATP III Classification:           < 200        mg/dL        Desirable          200 - 239     mg/dL        Borderline High          >= 240        mg/dL        High         Triglycerides 251 (*) <150 mg/dL   HDL 71  >39 mg/dL   Total CHOL/HDL Ratio 3.0     VLDL 50 (*) 0 - 40 mg/dL   LDL Cholesterol 93  0 - 99 mg/dL   Comment:       Total Cholesterol/HDL Ratio:CHD Risk                            Coronary Heart Disease Risk Table                                            Men       Women              1/2 Average Risk              3.4        3.3                  Average Risk              5.0        4.4               2X Average Risk              9.6        7.1               3X Average Risk             23.4        11.0     Use the calculated Patient Ratio above and the CHD Risk table      to determine the patient's CHD Risk.     ATP III Classification (LDL):           < 100        mg/dL         Optimal  100 - 129     mg/dL         Near or Above Optimal          130 - 159     mg/dL         Borderline High          160 - 189     mg/dL         High           > 190        mg/dL         Very High        URINALYSIS, ROUTINE W REFLEX MICROSCOPIC     Status: None   Collection Time    01/14/14 10:46 AM      Result Value Ref Range   Color, Urine YELLOW  YELLOW   APPearance CLEAR  CLEAR   Specific Gravity, Urine 1.017  1.005 - 1.030   pH 5.5  5.0 - 8.0   Glucose, UA NEG  NEG mg/dL   Bilirubin Urine NEG  NEG   Ketones, ur NEG  NEG mg/dL   Hgb urine dipstick NEG  NEG   Protein, ur NEG  NEG mg/dL   Urobilinogen, UA 0.2  0.0 - 1.0 mg/dL   Nitrite NEG  NEG   Leukocytes, UA NEG  NEG   Assessment/Plan: GERD (gastroesophageal reflux disease) Rx omeprazole daily.  Avoid late-night eating.  Elevate HOB. Follow-up 1 month.  Chronic knee pain Will obtain imaging. Rx Norco. Topical Aspercreme.  Avoid overexertion.  Elevate legs. Rx Prednisone busrt given for COPD exacerbation may also alleviate pain.  May need referral to Orthopedics for persistent symptoms.  COPD exacerbation Prednisone burst given for mild, persistent wheeze and chest tightness.  Continue antibiotic given by Pulmonologist.  Mucinex.  Humidifier in bedroom. Delsym for cough.  If symptoms are worsening, call 911 or proceed to ER.  Follow-up with Pulmonologist as scheduled.

## 2014-02-22 NOTE — Progress Notes (Signed)
Pre visit review using our clinic review tool, if applicable. No additional management support is needed unless otherwise documented below in the visit note/SLS  

## 2014-02-22 NOTE — Patient Instructions (Addendum)
Please take prednisone as directed. Resume antibiotics as directed.  If respiratory symptoms are worsening, please proceed to ER.  Continue Vicodin as needed for pain.  Apply topical Aspercreme or Witch Hazel to legs.  Avoid heavy lifting. Please go to the [facility] [facility] for imaging. I will call you with your results and further instructions.    You will be contacted by Pulmonology for a New Patient Appointment

## 2014-02-22 NOTE — Assessment & Plan Note (Signed)
Rx omeprazole daily.  Avoid late-night eating.  Elevate HOB. Follow-up 1 month.

## 2014-02-22 NOTE — Assessment & Plan Note (Signed)
Prednisone burst given for mild, persistent wheeze and chest tightness.  Continue antibiotic given by Pulmonologist.  Mucinex.  Humidifier in bedroom. Delsym for cough.  If symptoms are worsening, call 911 or proceed to ER.  Follow-up with Pulmonologist as scheduled.

## 2014-02-22 NOTE — Assessment & Plan Note (Signed)
Will obtain imaging. Rx Norco. Topical Aspercreme.  Avoid overexertion.  Elevate legs. Rx Prednisone busrt given for COPD exacerbation may also alleviate pain.  May need referral to Orthopedics for persistent symptoms.

## 2014-02-23 ENCOUNTER — Ambulatory Visit (HOSPITAL_BASED_OUTPATIENT_CLINIC_OR_DEPARTMENT_OTHER)
Admission: RE | Admit: 2014-02-23 | Discharge: 2014-02-23 | Disposition: A | Discharge status: Home / Self Care | Payer: Medicare HMO | Source: Ambulatory Visit | Attending: Physician Assistant | Admitting: Physician Assistant

## 2014-02-23 ENCOUNTER — Other Ambulatory Visit: Payer: Self-pay | Admitting: Physician Assistant

## 2014-02-23 DIAGNOSIS — G8929 Other chronic pain: Secondary | ICD-10-CM

## 2014-02-23 DIAGNOSIS — M25569 Pain in unspecified knee: Principal | ICD-10-CM

## 2014-02-23 DIAGNOSIS — IMO0002 Reserved for concepts with insufficient information to code with codable children: Secondary | ICD-10-CM | POA: Insufficient documentation

## 2014-02-23 DIAGNOSIS — M1710 Unilateral primary osteoarthritis, unspecified knee: Secondary | ICD-10-CM | POA: Insufficient documentation

## 2014-02-24 ENCOUNTER — Telehealth: Payer: Self-pay | Admitting: *Deleted

## 2014-02-24 DIAGNOSIS — G14 Postpolio syndrome: Secondary | ICD-10-CM

## 2014-02-24 DIAGNOSIS — J449 Chronic obstructive pulmonary disease, unspecified: Secondary | ICD-10-CM

## 2014-02-24 DIAGNOSIS — M179 Osteoarthritis of knee, unspecified: Secondary | ICD-10-CM

## 2014-02-24 DIAGNOSIS — M1710 Unilateral primary osteoarthritis, unspecified knee: Secondary | ICD-10-CM

## 2014-02-24 NOTE — Telephone Encounter (Signed)
Pt called back stating he would like to proceed with referral to specialist for his polio. Also requests referral to Pulmonology specialist within [place].   Notes Recorded by Leeanne Rio, PA-C on 02/23/2014 at 2:07 PM X-rays unremarkable. I would like for him to continue care discussed at yesterday's visit. I would like for him to consider letting us set him up with a specialist giving his symptoms and history of Polio.

## 2014-02-25 NOTE — Telephone Encounter (Signed)
Referrals granted.

## 2014-03-05 ENCOUNTER — Telehealth: Payer: Self-pay | Admitting: *Deleted

## 2014-03-05 DIAGNOSIS — K219 Gastro-esophageal reflux disease without esophagitis: Secondary | ICD-10-CM

## 2014-03-05 NOTE — Telephone Encounter (Signed)
Patient states that "he told provider he cannot take Omeprazole and he has tons of Rx at home because people keep prescribing it for him, even though 'other doctor' tells him he can't take it'?; causing diarrhea/SLS Please Advise.

## 2014-03-08 NOTE — Telephone Encounter (Signed)
Has the patient ever been on other PPI such as Nexium, Protonix or Dexilatnt?  If not we could attempt one of these to help with symptoms but hopefully avoid any stomach upset.

## 2014-03-09 NOTE — Telephone Encounter (Signed)
Notified pt. He states he has not tried either of the medications below.  Please advise.

## 2014-03-10 MED ORDER — FAMOTIDINE 20 MG PO TABS: 20.0000 mg | ORAL_TABLET | Freq: Two times a day (BID) | ORAL | Status: DC

## 2014-03-10 NOTE — Telephone Encounter (Signed)
Chart shows he has also been on Nexium before.  Since he is hesitant about PPIs, we will try switching to Pepcid 20 mg BID to see if this gives him better relief.

## 2014-03-10 NOTE — Telephone Encounter (Signed)
LMOM with contact name and number [for return call, if needed] RE: new Rx and further provider instructions/SLS

## 2014-03-10 NOTE — Telephone Encounter (Addendum)
Pt called back stating he could not hear all of my message; spoke with patient and reiterated provider instructions, pt understood & agreed/SLS

## 2014-03-11 ENCOUNTER — Encounter: Payer: Self-pay | Admitting: Emergency Medicine

## 2014-03-11 ENCOUNTER — Ambulatory Visit (INDEPENDENT_AMBULATORY_CARE_PROVIDER_SITE_OTHER): Payer: Medicare HMO | Admitting: Emergency Medicine

## 2014-03-11 VITALS — BP 140/92 | HR 73 | Ht 73.0 in | Wt 214.0 lb

## 2014-03-11 DIAGNOSIS — J449 Chronic obstructive pulmonary disease, unspecified: Secondary | ICD-10-CM

## 2014-03-11 NOTE — Patient Instructions (Signed)
Please start pepcid as planned Continue your Breo once a day We will obtain your breathing testing from [facility]R today Follow with Dr Lamonte Sakai in 1 month

## 2014-03-11 NOTE — Progress Notes (Signed)
Subjective:    Patient ID: [name], male    DOB: Sep 26, 1951, 63 y.o.   MRN: [id number]  HPI 63 yo man, hx former tobacco (pipe x 47 yrs), hx of TB (was treated inpatient x 6 months in his 20's in [place]), HTN, polio, GERD, prostate CA, untreated. Also dx w OSA.  Referred by Dr Hassell Done for evaluation of COPD. He had PFT about 2 months ago at [facility]. The referral paperwork says something about referral for lung cancer?? But the pt does not know anything about this.   He was started on Breo over a year ago. He has severe GERD and he is having trouble swallowing > burns. He describes episodic dyspnea, cough and sputum production that is often treated with abx. He hears wheeze with exertion. His chief complaint is chest discomfort. Also has dyspnea. No rhinitis on the singulair, loratadine.    Review of Systems  Constitutional: Negative for fever and unexpected weight change.  HENT: Positive for congestion, postnasal drip, sinus pressure and trouble swallowing. Negative for dental problem, ear pain, nosebleeds, rhinorrhea, sneezing and sore throat.   Eyes: Negative for redness and itching.  Respiratory: Positive for shortness of breath and wheezing. Negative for cough and chest tightness.   Cardiovascular: Negative for palpitations and leg swelling.  Gastrointestinal: Positive for abdominal distention. Negative for nausea and vomiting.  Genitourinary: Negative for dysuria.  Musculoskeletal: Negative for joint swelling.  Skin: Negative for rash.  Neurological: Negative for headaches.  Psychiatric/Behavioral: Positive for dysphoric mood. The patient is nervous/anxious.    Past Medical History  Diagnosis Date  . COPD (chronic obstructive pulmonary disease)   . Hypertension   . Polio   . Bronchitis   . DJD (degenerative joint disease) of hip   . Angina pectoris   . Tuberculosis   . Prostate cancer   . Chicken pox   . Rheumatic fever   . Measles   . Mumps   . GERD  (gastroesophageal reflux disease)   . Alcoholism      Family History  Problem Relation Age of Onset  . Alzheimer's disease Father 4    Deceased  . Stomach cancer Father   . Heart attack Father   . Heart disease Father   . Skin cancer Mother     Facial-Living  . Alcohol abuse Sister     x2  . Mental illness Sister     x2  . Diabetes Maternal Aunt     x2  . Thyroid disease Maternal Aunt     x4  . Diabetes Maternal Uncle   . Tuberculosis Paternal Grandfather   . Tuberculosis Paternal Grandmother   . Alzheimer's disease Paternal Aunt   . Alzheimer's disease Paternal Uncle      History   Social History  . Marital Status: Married    Spouse Name: N/A    Number of Children: N/A  . Years of Education: N/A   Occupational History  . Not on file.   Social History Main Topics  . Smoking status: Former Smoker -- 85 years    Types: Pipe    Quit date: 08/15/2013  . Smokeless tobacco: Not on file  . Alcohol Use: Yes  . Drug Use: No  . Sexual Activity: Not Currently   Other Topics Concern  . Not on file   Social History Narrative  . No narrative on file     Allergies  Allergen Reactions  . Ivp Dye [Iodinated Diagnostic Agents] Anaphylaxis  .  Penicillins Anaphylaxis    Heart stops  . Aspirin Other (See Comments)    Reaction unknown  . Morphine And Related Nausea And Vomiting  . Nsaids Other (See Comments)     Outpatient Prescriptions Prior to Visit  Medication Sig Dispense Refill  . albuterol (PROVENTIL HFA;VENTOLIN HFA) 108 (90 BASE) MCG/ACT inhaler Inhale into the lungs every 6 (six) hours as needed for wheezing or shortness of breath. PROAIR      . albuterol (PROVENTIL) (2.5 MG/3ML) 0.083% nebulizer solution Take 2.5 mg by nebulization every 6 (six) hours as needed for wheezing or shortness of breath.      Marland Kitchen amLODipine (NORVASC) 10 MG tablet Take 10 mg by mouth daily.      Marland Kitchen EPINEPHrine (EPIPEN) 0.3 mg/0.3 mL SOAJ injection Inject 0.3 mg into the muscle once.       . Fluticasone Furoate-Vilanterol (BREO ELLIPTA) 100-25 MCG/INH AEPB Inhale 1 puff into the lungs every morning.      . loratadine (CLARITIN) 10 MG tablet TAKE 1 TABLET BY MOUTH EVERY DAY  30 tablet  0  . losartan (COZAAR) 50 MG tablet Take 50 mg by mouth daily.      . montelukast (SINGULAIR) 10 MG tablet Take 10 mg by mouth at bedtime.      . nitroGLYCERIN (NITROSTAT) 0.4 MG SL tablet Place 0.4 mg under the tongue every 5 (five) minutes as needed. For chest pain      . ranitidine (ZANTAC) 300 MG tablet Take 150 mg by mouth 2 (two) times daily.      . famotidine (PEPCID) 20 MG tablet Take 1 tablet (20 mg total) by mouth 2 (two) times daily.  60 tablet  1  . HYDROcodone-acetaminophen (NORCO) 10-325 MG per tablet Take 1 tablet by mouth every 8 (eight) hours as needed.  40 tablet  0  . albuterol-ipratropium (COMBIVENT) 18-103 MCG/ACT inhaler Inhale 2 puffs into the lungs every 6 (six) hours as needed. For shortness of breath      . cefdinir (OMNICEF) 300 MG capsule Take 300 mg by mouth 2 (two) times daily.      Marland Kitchen doxycycline (VIBRAMYCIN) 100 MG capsule Take 100 mg by mouth 2 (two) times daily.      . predniSONE (DELTASONE) 20 MG tablet Take 2 tablets (40 mg total) by mouth daily with breakfast.  10 tablet  0   No facility-administered medications prior to visit.        Objective:   Physical Exam Filed Vitals:   03/11/14 1005  BP: 140/92  Pulse: 73  Height: 6\' 1"  (1.854 m)  Weight: 214 lb (97.07 kg)  SpO2: 98%  Gen: Pleasant, well-nourished, in no distress,  normal affect  ENT: No lesions,  mouth clear,  oropharynx clear, no postnasal drip  Neck: No JVD, no TMG, no carotid bruits  Lungs: No use of accessory muscles, distant, clear without rales or rhonchi  Cardiovascular: RRR, heart sounds normal, no murmur or gallops, no peripheral edema  Musculoskeletal: No deformities, no cyanosis or clubbing  Neuro: alert, non focal  Skin: Warm, no lesions or rashes     Assessment &  Plan:  COPD (chronic obstructive pulmonary disease) - we will obtain PFT from [facility]; only smoked a pipe, ? Severity of his COPD > consider UA disease and allergies as a cause for his flares.  - continue Breo for now, consider a change at some point - CXR today - spoke with Dr Hassell Done > the information received regarding suspicion for  lung CA is incorrect; no evidence of suspicion for same. He is referred for COPD.

## 2014-03-11 NOTE — Assessment & Plan Note (Signed)
-   we will obtain PFT from [facility]; only smoked a pipe, ? Severity of his COPD > consider UA disease and allergies as a cause for his flares.  - continue Breo for now, consider a change at some point - CXR today - spoke with Dr Hassell Done > the information received regarding suspicion for lung CA is incorrect; no evidence of suspicion for same. He is referred for COPD.

## 2014-03-18 ENCOUNTER — Telehealth: Payer: Self-pay | Admitting: Physician Assistant

## 2014-03-18 DIAGNOSIS — L989 Disorder of the skin and subcutaneous tissue, unspecified: Secondary | ICD-10-CM

## 2014-03-18 NOTE — Telephone Encounter (Signed)
Please Advise

## 2014-03-18 NOTE — Telephone Encounter (Signed)
Patient called in stating that he would like a referral to a dermatologist. He says that he has a mole that is growing and has a lesion on his face.

## 2014-03-19 NOTE — Telephone Encounter (Signed)
LMOM with contact name and number [for return call, if needed] RE: referral placed per provider/SLS

## 2014-03-19 NOTE — Telephone Encounter (Signed)
Referral placed. Patient will be contacted by specialty.

## 2014-03-25 ENCOUNTER — Ambulatory Visit (HOSPITAL_COMMUNITY): Payer: Self-pay | Admitting: Psychiatry

## 2014-03-27 ENCOUNTER — Emergency Department (HOSPITAL_BASED_OUTPATIENT_CLINIC_OR_DEPARTMENT_OTHER)
Admission: EM | Admit: 2014-03-27 | Discharge: 2014-03-27 | Disposition: A | Discharge status: Home / Self Care | Payer: Medicare HMO | Attending: Emergency Medicine | Admitting: Emergency Medicine

## 2014-03-27 ENCOUNTER — Encounter (HOSPITAL_BASED_OUTPATIENT_CLINIC_OR_DEPARTMENT_OTHER): Payer: Self-pay | Admitting: Emergency Medicine

## 2014-03-27 ENCOUNTER — Emergency Department (HOSPITAL_BASED_OUTPATIENT_CLINIC_OR_DEPARTMENT_OTHER): Payer: Medicare HMO

## 2014-03-27 DIAGNOSIS — S161XXA Strain of muscle, fascia and tendon at neck level, initial encounter: Secondary | ICD-10-CM

## 2014-03-27 DIAGNOSIS — Z87891 Personal history of nicotine dependence: Secondary | ICD-10-CM | POA: Insufficient documentation

## 2014-03-27 DIAGNOSIS — I10 Essential (primary) hypertension: Secondary | ICD-10-CM | POA: Insufficient documentation

## 2014-03-27 DIAGNOSIS — J4489 Other specified chronic obstructive pulmonary disease: Secondary | ICD-10-CM | POA: Insufficient documentation

## 2014-03-27 DIAGNOSIS — J449 Chronic obstructive pulmonary disease, unspecified: Secondary | ICD-10-CM | POA: Insufficient documentation

## 2014-03-27 DIAGNOSIS — Z8719 Personal history of other diseases of the digestive system: Secondary | ICD-10-CM | POA: Insufficient documentation

## 2014-03-27 DIAGNOSIS — Z8619 Personal history of other infectious and parasitic diseases: Secondary | ICD-10-CM | POA: Insufficient documentation

## 2014-03-27 DIAGNOSIS — S060X9A Concussion with loss of consciousness of unspecified duration, initial encounter: Secondary | ICD-10-CM | POA: Insufficient documentation

## 2014-03-27 DIAGNOSIS — Z880 Allergy status to penicillin: Secondary | ICD-10-CM | POA: Insufficient documentation

## 2014-03-27 DIAGNOSIS — S060XAA Concussion with loss of consciousness status unknown, initial encounter: Secondary | ICD-10-CM | POA: Insufficient documentation

## 2014-03-27 DIAGNOSIS — Y9289 Other specified places as the place of occurrence of the external cause: Secondary | ICD-10-CM | POA: Insufficient documentation

## 2014-03-27 DIAGNOSIS — W010XXA Fall on same level from slipping, tripping and stumbling without subsequent striking against object, initial encounter: Secondary | ICD-10-CM | POA: Insufficient documentation

## 2014-03-27 DIAGNOSIS — I209 Angina pectoris, unspecified: Secondary | ICD-10-CM | POA: Insufficient documentation

## 2014-03-27 DIAGNOSIS — IMO0002 Reserved for concepts with insufficient information to code with codable children: Secondary | ICD-10-CM | POA: Insufficient documentation

## 2014-03-27 DIAGNOSIS — Z79899 Other long term (current) drug therapy: Secondary | ICD-10-CM | POA: Insufficient documentation

## 2014-03-27 DIAGNOSIS — Y9389 Activity, other specified: Secondary | ICD-10-CM | POA: Insufficient documentation

## 2014-03-27 DIAGNOSIS — Z8546 Personal history of malignant neoplasm of prostate: Secondary | ICD-10-CM | POA: Insufficient documentation

## 2014-03-27 DIAGNOSIS — S139XXA Sprain of joints and ligaments of unspecified parts of neck, initial encounter: Secondary | ICD-10-CM | POA: Insufficient documentation

## 2014-03-27 DIAGNOSIS — F1021 Alcohol dependence, in remission: Secondary | ICD-10-CM | POA: Insufficient documentation

## 2014-03-27 NOTE — ED Provider Notes (Signed)
CSN: [id number]     Arrival date & time 03/27/14  2019 History  This chart was scribed for Veryl Speak, MD by Randa Evens, ED Scribe. This patient was seen in room MH03/MH03 and the patient's care was started at 8:40 PM.      Chief Complaint  Patient presents with  . Fall   Patient is a 63 y.o. male presenting with fall. The history is provided by the patient. No language interpreter was used.  Fall Associated symptoms include headaches. Pertinent negatives include no chest pain and no shortness of breath.   HPI Comments: [name] is a 63 y.o. male who presents to the Emergency Department complaining of fall onset yesterday at 2 PM. States he has associated headache, hearing loss, tinnitus, blurred vision, dizziness, nausea, light headedness and chest tightness. States that he tripped coming down 4 steps. He states that he landed on his head. He states he was working out in the yard and continued to work after the fall. He denies LOC, vomiting, or SOB. He denies neck pain but neck appeared to be tender to palpation. He also states his blood pressure has been rising since the fall. He states he tried to continue to work today but was unable to work longer than 30 minutes due to the headache. He states he was pissed off after the fall.  He states he has a h/o COPD, and prostate cancer.    Past Medical History  Diagnosis Date  . COPD (chronic obstructive pulmonary disease)   . Hypertension   . Polio   . Bronchitis   . DJD (degenerative joint disease) of hip   . Angina pectoris   . Tuberculosis   . Prostate cancer   . Chicken pox   . Rheumatic fever   . Measles   . Mumps   . GERD (gastroesophageal reflux disease)   . Alcoholism    Past Surgical History  Procedure Laterality Date  . Rotator cuff repair    . Other surgical history       Muscle & bone Graft/Polio  . Cholecystectomy    . Nose surgery    . Nasal septum surgery    . Kidney stone surgery    . Hand surgery     . Shoulder surgery      Arthritis & Bone Spurs  . Dental surgery     Family History  Problem Relation Age of Onset  . Alzheimer's disease Father 4    Deceased  . Stomach cancer Father   . Heart attack Father   . Heart disease Father   . Skin cancer Mother     Facial-Living  . Alcohol abuse Sister     x2  . Mental illness Sister     x2  . Diabetes Maternal Aunt     x2  . Thyroid disease Maternal Aunt     x4  . Diabetes Maternal Uncle   . Tuberculosis Paternal Grandfather   . Tuberculosis Paternal Grandmother   . Alzheimer's disease Paternal Aunt   . Alzheimer's disease Paternal Uncle    History  Substance Use Topics  . Smoking status: Former Smoker -- 75 years    Types: Pipe    Quit date: 08/15/2013  . Smokeless tobacco: Not on file  . Alcohol Use: Yes    Review of Systems  HENT: Positive for tinnitus.   Eyes: Positive for visual disturbance.  Respiratory: Positive for chest tightness. Negative for shortness of breath.   Cardiovascular:  Negative for chest pain.  Gastrointestinal: Positive for nausea. Negative for vomiting.  Musculoskeletal: Negative for neck pain.  Neurological: Positive for dizziness, light-headedness and headaches. Negative for syncope.   A complete 10 system review of systems was obtained and all systems are negative except as noted in the HPI and PMH.   Allergies  Ivp dye; Penicillins; Aspirin; Morphine and related; and Nsaids  Home Medications   Prior to Admission medications   Medication Sig Start Date End Date Taking? Authorizing Provider  albuterol (PROVENTIL HFA;VENTOLIN HFA) 108 (90 BASE) MCG/ACT inhaler Inhale into the lungs every 6 (six) hours as needed for wheezing or shortness of breath. PROAIR    Historical Provider, MD  albuterol (PROVENTIL) (2.5 MG/3ML) 0.083% nebulizer solution Take 2.5 mg by nebulization every 6 (six) hours as needed for wheezing or shortness of breath.    Historical Provider, MD  amLODipine (NORVASC) 10 MG  tablet Take 10 mg by mouth daily.    Historical Provider, MD  EPINEPHrine (EPIPEN) 0.3 mg/0.3 mL SOAJ injection Inject 0.3 mg into the muscle once.    Historical Provider, MD  famotidine (PEPCID) 20 MG tablet Take 1 tablet (20 mg total) by mouth 2 (two) times daily. 03/10/14   Leeanne Rio, PA-C  Fluticasone Furoate-Vilanterol (BREO ELLIPTA) 100-25 MCG/INH AEPB Inhale 1 puff into the lungs every morning.    Historical Provider, MD  HYDROcodone-acetaminophen (NORCO) 10-325 MG per tablet Take 1 tablet by mouth every 8 (eight) hours as needed. 02/22/14   Leeanne Rio, PA-C  loratadine (CLARITIN) 10 MG tablet TAKE 1 TABLET BY MOUTH EVERY DAY 01/25/14   Leeanne Rio, PA-C  losartan (COZAAR) 50 MG tablet Take 50 mg by mouth daily.    Historical Provider, MD  montelukast (SINGULAIR) 10 MG tablet Take 10 mg by mouth at bedtime.    Historical Provider, MD  nitroGLYCERIN (NITROSTAT) 0.4 MG SL tablet Place 0.4 mg under the tongue every 5 (five) minutes as needed. For chest pain    Historical Provider, MD   Triage Vitals: BP 161/67  Pulse 91  Temp(Src) 98.1 F (36.7 C) (Oral)  Resp 20  Ht 6\' 1"  (1.854 m)  Wt 212 lb (96.163 kg)  BMI 27.98 kg/m2  SpO2 98%  Physical Exam  Nursing note and vitals reviewed. Constitutional: He is oriented to person, place, and time. He appears well-developed and well-nourished. No distress.  HENT:  Head: Normocephalic and atraumatic.  No papilledema   Eyes: Conjunctivae and EOM are normal. Pupils are equal, round, and reactive to light.  Neck: Normal range of motion. No tracheal deviation present.  Cardiovascular: Normal rate.   Pulmonary/Chest: Effort normal. No respiratory distress.  Musculoskeletal: Normal range of motion.  Neurological: He is alert and oriented to person, place, and time. No cranial nerve deficit. He exhibits normal muscle tone. Coordination normal.  Skin: Skin is warm and dry.  Psychiatric: He has a normal mood and affect. His  behavior is normal.    ED Course  Procedures (including critical care time) DIAGNOSTIC STUDIES: Oxygen Saturation is 98% on RA, normal by my interpretation.    COORDINATION OF CARE: 8:50 PM-Discussed treatment plan which includes CT scan of head and cervical spine with pt at bedside and pt agreed to plan.     Labs Review Labs Reviewed - No data to display  Imaging Review No results found.   EKG Interpretation None      MDM   Final diagnoses:  None  Patient presents after a fall during which he struck his head. He is having headache.  He is neurologically intact and CT scan of the head is unremarkable. He had some tenderness in the upper cervical region, therefore a CT of the cervical spine was obtained as well. This was negative for fracture. At this point I feel as though he is appropriate for discharge. I will advise ibuprofen and Tylenol as needed for pain and he is to return as needed if his symptoms worsen or change.   I personally performed the services described in this documentation, which was scribed in my presence. The recorded information has been reviewed and is accurate.       Veryl Speak, MD 03/27/14 2300

## 2014-03-27 NOTE — Discharge Instructions (Signed)
Tylenol 1000 mg every 6 hours as needed for pain.   Concussion, Adult A concussion, or closed-head injury, is a brain injury caused by a direct blow to the head or by a quick and sudden movement (jolt) of the head or neck. Concussions are usually not life-threatening. Even so, the effects of a concussion can be serious. If you have had a concussion before, you are more likely to experience concussion-like symptoms after a direct blow to the head.  CAUSES   Direct blow to the head, such as from running into another player during a soccer game, being hit in a fight, or hitting your head on a hard surface.  A jolt of the head or neck that causes the brain to move back and forth inside the skull, such as in a car crash. SIGNS AND SYMPTOMS  The signs of a concussion can be hard to notice. Early on, they may be missed by you, family members, and health care providers. You may look fine but act or feel differently. Symptoms are usually temporary, but they may last for days, weeks, or even longer. Some symptoms may appear right away while others may not show up for hours or days. Every head injury is different. Symptoms include:   Mild to moderate headaches that will not go away.  A feeling of pressure inside your head.  Having more trouble than usual:   Learning or remembering things you have heard.  Answering questions.  Paying attention or concentrating.   Organizing daily tasks.   Making decisions and solving problems.   Slowness in thinking, acting or reacting, speaking, or reading.   Getting lost or being easily confused.   Feeling tired all the time or lacking energy (fatigued).   Feeling drowsy.   Sleep disturbances.   Sleeping more than usual.   Sleeping less than usual.   Trouble falling asleep.   Trouble sleeping (insomnia).   Loss of balance or feeling lightheaded or dizzy.   Nausea or vomiting.   Numbness or tingling.   Increased  sensitivity to:   Sounds.   Lights.   Distractions.   Vision problems or eyes that tire easily.   Diminished sense of taste or smell.   Ringing in the ears.   Mood changes such as feeling sad or anxious.   Becoming easily irritated or angry for little or no reason.   Lack of motivation.  Seeing or hearing things other people do not see or hear (hallucinations). DIAGNOSIS  Your health care provider can usually diagnose a concussion based on a description of your injury and symptoms. He or she will ask whether you passed out (lost consciousness) and whether you are having trouble remembering events that happened right before and during your injury.  Your evaluation might include:   A brain scan to look for signs of injury to the brain. Even if the test shows no injury, you may still have a concussion.   Blood tests to be sure other problems are not present. TREATMENT   Concussions are usually treated in an emergency department, in urgent care, or at a clinic. You may need to stay in the hospital overnight for further treatment.   Tell your health care provider if you are taking any medicines, including prescription medicines, over-the-counter medicines, and natural remedies. Some medicines, such as blood thinners (anticoagulants) and aspirin, may increase the chance of complications. Also tell your health care provider whether you have had alcohol or are taking illegal drugs. This  information may affect treatment.  Your health care provider will send you home with important instructions to follow.  How fast you will recover from a concussion depends on many factors. These factors include how severe your concussion is, what part of your brain was injured, your age, and how healthy you were before the concussion.  Most people with mild injuries recover fully. Recovery can take time. In general, recovery is slower in older persons. Also, persons who have had a concussion  in the past or have other medical problems may find that it takes longer to recover from their current injury. HOME CARE INSTRUCTIONS  General Instructions  Carefully follow the directions your health care provider gave you.  Only take over-the-counter or prescription medicines for pain, discomfort, or fever as directed by your health care provider.  Take only those medicines that your health care provider has approved.  Do not drink alcohol until your health care provider says you are well enough to do so. Alcohol and certain other drugs may slow your recovery and can put you at risk of further injury.  If it is harder than usual to remember things, write them down.  If you are easily distracted, try to do one thing at a time. For example, do not try to watch TV while fixing dinner.  Talk with family members or close friends when making important decisions.  Keep all follow-up appointments. Repeated evaluation of your symptoms is recommended for your recovery.  Watch your symptoms and tell others to do the same. Complications sometimes occur after a concussion. Older adults with a brain injury may have a higher risk of serious complications such as of a blood clot on the brain.  Tell your teachers, school nurse, school counselor, coach, athletic trainer, or work Freight forwarder about your injury, symptoms, and restrictions. Tell them about what you can or cannot do. They should watch for:   Increased problems with attention or concentration.   Increased difficulty remembering or learning new information.   Increased time needed to complete tasks or assignments.   Increased irritability or decreased ability to cope with stress.   Increased symptoms.   Rest. Rest helps the brain to heal. Make sure you:  Get plenty of sleep at night. Avoid staying up late at night.  Keep the same bedtime hours on weekends and weekdays.  Rest during the day. Take daytime naps or rest breaks when  you feel tired.  Limit activities that require a lot of thought or concentration. These includes   Doing homework or job-related work.   Watching TV.   Working on the computer.  Avoid any situation where there is potential for another head injury (football, hockey, soccer, basketball, martial arts, downhill snow sports and horseback riding). Your condition will get worse every time you experience a concussion. You should avoid these activities until you are evaluated by the appropriate follow-up caregivers. Returning To Your Regular Activities You will need to return to your normal activities slowly, not all at once. You must give your body and brain enough time for recovery.  Do not return to sports or other athletic activities until your health care provider tells you it is safe to do so.  Ask your health care provider when you can drive, ride a bicycle, or operate heavy machinery. Your ability to react may be slower after a brain injury. Never do these activities if you are dizzy.  Ask your health care provider about when you can return to work  or school. Preventing Another Concussion It is very important to avoid another brain injury, especially before you have recovered. In rare cases, another injury can lead to permanent brain damage, brain swelling, or death. The risk of this is greatest during the first 7 10 days after a head injury. Avoid injuries by:   Wearing a seat belt when riding in a car.   Drinking alcohol only in moderation.   Wearing a helmet when biking, skiing, skateboarding, skating, or doing similar activities.  Avoiding activities that could lead to a second concussion, such as contact or recreational sports, until your health care provider says it is OK.  Taking safety measures in your home.   Remove clutter and tripping hazards from floors and stairways.   Use grab bars in bathrooms and handrails by stairs.   Place non-slip mats on floors and in  bathtubs.   Improve lighting in dim areas. SEEK MEDICAL CARE IF:   You have increased problems paying attention or concentrating.   You have increased difficulty remembering or learning new information.   You need more time to complete tasks or assignments than before.   You have increased irritability or decreased ability to cope with stress.  You have more symptoms than before. Seek medical care if you have any of the following symptoms for more than 2 weeks after your injury:   Lasting (chronic) headaches.   Dizziness or balance problems.   Nausea.  Vision problems.   Increased sensitivity to noise or light.   Depression or mood swings.   Anxiety or irritability.   Memory problems.   Difficulty concentrating or paying attention.   Sleep problems.   Feeling tired all the time. SEEK IMMEDIATE MEDICAL CARE IF:   You have severe or worsening headaches. These may be a sign of a blood clot in the brain.  You have weakness (even if only in one hand, leg, or part of the face).  You have numbness.  You have decreased coordination.   You vomit repeatedly.  You have increased sleepiness.  One pupil is larger than the other.   You have convulsions.   You have slurred speech.   You have increased confusion. This may be a sign of a blood clot in the brain.  You have increased restlessness, agitation, or irritability.   You are unable to recognize people or places.   You have neck pain.   It is difficult to wake you up.   You have unusual behavior changes.   You lose consciousness. MAKE SURE YOU:   Understand these instructions.  Will watch your condition.  Will get help right away if you are not doing well or get worse. Document Released: 12/22/2003 Document Revised: 06/03/2013 Document Reviewed: 04/23/2013 [facility] Patient Information 2014 [place], [place].  Cervical Sprain A cervical sprain is an injury in the neck in which  the strong, fibrous tissues (ligaments) that connect your neck bones stretch or tear. Cervical sprains can range from mild to severe. Severe cervical sprains can cause the neck vertebrae to be unstable. This can lead to damage of the spinal cord and can result in serious nervous system problems. The amount of time it takes for a cervical sprain to get better depends on the cause and extent of the injury. Most cervical sprains heal in 1 to 3 weeks. CAUSES  Severe cervical sprains may be caused by:   Contact sport injuries (such as from football, rugby, wrestling, hockey, auto racing, gymnastics, diving, martial arts, or  boxing).   Motor vehicle collisions.   Whiplash injuries. This is an injury from a sudden forward-and backward whipping movement of the head and neck.  Falls.  Mild cervical sprains may be caused by:   Being in an awkward position, such as while cradling a telephone between your ear and shoulder.   Sitting in a chair that does not offer proper support.   Working at a poorly Landscape architect station.   Looking up or down for long periods of time.  SYMPTOMS   Pain, soreness, stiffness, or a burning sensation in the front, back, or sides of the neck. This discomfort may develop immediately after the injury or slowly, 24 hours or more after the injury.   Pain or tenderness directly in the middle of the back of the neck.   Shoulder or upper back pain.   Limited ability to move the neck.   Headache.   Dizziness.   Weakness, numbness, or tingling in the hands or arms.   Muscle spasms.   Difficulty swallowing or chewing.   Tenderness and swelling of the neck.  DIAGNOSIS  Most of the time your health care provider can diagnose a cervical sprain by taking your history and doing a physical exam. Your health care provider will ask about previous neck injuries and any known neck problems, such as arthritis in the neck. X-rays may be taken to find out if  there are any other problems, such as with the bones of the neck. Other tests, such as a CT scan or MRI, may also be needed.  TREATMENT  Treatment depends on the severity of the cervical sprain. Mild sprains can be treated with rest, keeping the neck in place (immobilization), and pain medicines. Severe cervical sprains are immediately immobilized. Further treatment is done to help with pain, muscle spasms, and other symptoms and may include:  Medicines, such as pain relievers, numbing medicines, or muscle relaxants.   Physical therapy. This may involve stretching exercises, strengthening exercises, and posture training. Exercises and improved posture can help stabilize the neck, strengthen muscles, and help stop symptoms from returning.  HOME CARE INSTRUCTIONS   Put ice on the injured area.   Put ice in a plastic bag.   Place a towel between your skin and the bag.   Leave the ice on for 15 20 minutes, 3 4 times a day.   If your injury was severe, you may have been given a cervical collar to wear. A cervical collar is a two-piece collar designed to keep your neck from moving while it heals.  Do not remove the collar unless instructed by your health care provider.  If you have long hair, keep it outside of the collar.  Ask your health care provider before making any adjustments to your collar. Minor adjustments may be required over time to improve comfort and reduce pressure on your chin or on the back of your head.  Ifyou are allowed to remove the collar for cleaning or bathing, follow your health care provider's instructions on how to do so safely.  Keep your collar clean by wiping it with mild soap and water and drying it completely. If the collar you have been given includes removable pads, remove them every 1 2 days and hand wash them with soap and water. Allow them to air dry. They should be completely dry before you wear them in the collar.  If you are allowed to remove the  collar for cleaning and bathing, wash and  dry the skin of your neck. Check your skin for irritation or sores. If you see any, tell your health care provider.  Do not drive while wearing the collar.   Only take over-the-counter or prescription medicines for pain, discomfort, or fever as directed by your health care provider.   Keep all follow-up appointments as directed by your health care provider.   Keep all physical therapy appointments as directed by your health care provider.   Make any needed adjustments to your workstation to promote good posture.   Avoid positions and activities that make your symptoms worse.   Warm up and stretch before being active to help prevent problems.  SEEK MEDICAL CARE IF:   Your pain is not controlled with medicine.   You are unable to decrease your pain medicine over time as planned.   Your activity level is not improving as expected.  SEEK IMMEDIATE MEDICAL CARE IF:   You develop any bleeding.  You develop stomach upset.  You have signs of an allergic reaction to your medicine.   Your symptoms get worse.   You develop new, unexplained symptoms.   You have numbness, tingling, weakness, or paralysis in any part of your body.  MAKE SURE YOU:   Understand these instructions.  Will watch your condition.  Will get help right away if you are not doing well or get worse. Document Released: 07/29/2007 Document Revised: 07/22/2013 Document Reviewed: 04/08/2013 [facility] Patient Information 2014 [place].

## 2014-03-27 NOTE — ED Notes (Signed)
[address] down some stairs yesterday, felt 'like a got the wind knocked out of me.'  Struck his head when he fell.  Didn't feel well all day.  This morning, states he felt his blood pressure going up.  C/o generalized headache with intermittent hearing loss/tinnitus.  C/o developing blurred vision, dizzy.  C/o nausa, no vomiting.  Is also reporting chest tightness now.

## 2014-03-29 ENCOUNTER — Telehealth: Payer: Self-pay | Admitting: *Deleted

## 2014-03-29 NOTE — Telephone Encounter (Signed)
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

## 2014-03-29 NOTE — Telephone Encounter (Signed)
Depends on the extent of strain to his neck and force of impact to his head.  Headaches can be common for the first week or so after a concussion and can even occur up to 1 month out.  This is usually not the case though.  I recommend that he follow the ER physician's instructions, as well as use topical Aspercreme applied to neck to reduce inflammation and soreness.  Can return to work as he is feeling better.  I suspect within a week or so he will be feeling much better.  If he has severe headache, nausea or vomiting, he is to return to clinic or proceed to UC or ER.  Call or return to clinic if any new symptoms develop.

## 2014-03-31 ENCOUNTER — Ambulatory Visit: Payer: Self-pay | Admitting: Physician Assistant

## 2014-04-01 ENCOUNTER — Ambulatory Visit: Payer: Medicare HMO | Admitting: Physician Assistant

## 2014-04-01 ENCOUNTER — Telehealth: Payer: Self-pay | Admitting: Physician Assistant

## 2014-04-01 NOTE — Telephone Encounter (Signed)
Patient called in and states that his car is overheating and will not be able to make it to his appointment today. I rescheduled his appointment to 04/05/14. This was first available.

## 2014-04-01 NOTE — Telephone Encounter (Signed)
Spoke with patient as he R/S ED follow-up appt today until Mon, 06.22.15 to assess symptoms; pt is doing Ok now w/mild episodes of lightheadedness. Informed Ok to take Hydrocodone and/or Promethazine PRN, as long as he is immobile and at home or has an adult with him, pt understood & agreed. If new and/or worsening symptoms develop before appt, pt will proceed to UC and/or ER/SLS

## 2014-04-01 NOTE — Telephone Encounter (Signed)
That is fine.  CMA spoke with patient and he is doing well.  No concerning symptoms at present to warrant sooner appointment

## 2014-04-02 ENCOUNTER — Other Ambulatory Visit: Payer: Self-pay | Admitting: Physician Assistant

## 2014-04-02 NOTE — Telephone Encounter (Signed)
Zantac D/C by Provider, denied; other request[s] to pharmacy/SLS

## 2014-04-05 ENCOUNTER — Encounter: Payer: Self-pay | Admitting: Physician Assistant

## 2014-04-05 ENCOUNTER — Ambulatory Visit (INDEPENDENT_AMBULATORY_CARE_PROVIDER_SITE_OTHER): Payer: Medicare HMO | Admitting: Physician Assistant

## 2014-04-05 VITALS — BP 108/72 | HR 74 | Temp 98.2°F | Resp 16 | Ht 73.0 in | Wt 210.5 lb

## 2014-04-05 DIAGNOSIS — S060X0D Concussion without loss of consciousness, subsequent encounter: Secondary | ICD-10-CM

## 2014-04-05 DIAGNOSIS — S060X0A Concussion without loss of consciousness, initial encounter: Secondary | ICD-10-CM

## 2014-04-05 DIAGNOSIS — G589 Mononeuropathy, unspecified: Secondary | ICD-10-CM

## 2014-04-05 DIAGNOSIS — Z5189 Encounter for other specified aftercare: Secondary | ICD-10-CM

## 2014-04-05 DIAGNOSIS — G629 Polyneuropathy, unspecified: Secondary | ICD-10-CM

## 2014-04-05 LAB — HEMOGLOBIN A1C
Hgb A1c MFr Bld: 6 % — ABNORMAL HIGH (ref <5.7)
Mean Plasma Glucose: 126 mg/dL — ABNORMAL HIGH (ref <117)

## 2014-04-05 MED ORDER — GABAPENTIN 300 MG PO CAPS: ORAL_CAPSULE | ORAL | Status: DC

## 2014-04-05 MED ORDER — EPINEPHRINE 0.3 MG/0.3ML IJ SOAJ: 0.3000 mg | Freq: Once | INTRAMUSCULAR | Status: DC

## 2014-04-05 NOTE — Progress Notes (Signed)
Pre visit review using our clinic review tool, if applicable. No additional management support is needed unless otherwise documented below in the visit note/SLS  

## 2014-04-05 NOTE — Progress Notes (Signed)
Patient presents to clinic today for ER follow-up of cervical neck strain and concussion. Workup including imaging were unremarkable for fracture or other acute abnormality. Patient states he has been doing well since the fall.  Endorses good ROM of neck.  Denies headache, vision changes, amnesia, or nausea/vomiting. Patent still having problems with chronic neuropathy.  Past Medical History  Diagnosis Date  . COPD (chronic obstructive pulmonary disease)   . Hypertension   . Polio   . Bronchitis   . DJD (degenerative joint disease) of hip   . Angina pectoris   . Tuberculosis   . Prostate cancer   . Chicken pox   . Rheumatic fever   . Measles   . Mumps   . GERD (gastroesophageal reflux disease)   . Alcoholism     Current Outpatient Prescriptions on File Prior to Visit  Medication Sig Dispense Refill  . albuterol (PROVENTIL HFA;VENTOLIN HFA) 108 (90 BASE) MCG/ACT inhaler Inhale into the lungs every 6 (six) hours as needed for wheezing or shortness of breath. PROAIR      . albuterol (PROVENTIL) (2.5 MG/3ML) 0.083% nebulizer solution Take 2.5 mg by nebulization every 6 (six) hours as needed for wheezing or shortness of breath.      Marland Kitchen amLODipine (NORVASC) 10 MG tablet TAKE 1 TABLET (10 MG TOTAL) BY MOUTH DAILY.  90 tablet  0  . famotidine (PEPCID) 20 MG tablet Take 1 tablet (20 mg total) by mouth 2 (two) times daily.  60 tablet  1  . Fluticasone Furoate-Vilanterol (BREO ELLIPTA) 100-25 MCG/INH AEPB Inhale 1 puff into the lungs every morning.      . loratadine (CLARITIN) 10 MG tablet TAKE 1 TABLET BY MOUTH EVERY DAY  30 tablet  0  . losartan (COZAAR) 50 MG tablet TAKE 1 TABLET (50 MG TOTAL) BY MOUTH DAILY.  90 tablet  0  . montelukast (SINGULAIR) 10 MG tablet Take 10 mg by mouth at bedtime.      . nitroGLYCERIN (NITROSTAT) 0.4 MG SL tablet Place 0.4 mg under the tongue every 5 (five) minutes as needed. For chest pain       No current facility-administered medications on file prior to visit.     Allergies  Allergen Reactions  . Ivp Dye [Iodinated Diagnostic Agents] Anaphylaxis  . Penicillins Anaphylaxis    Heart stops  . Aspirin Other (See Comments)    Reaction unknown  . Morphine And Related Nausea And Vomiting  . Nsaids Other (See Comments)    Family History  Problem Relation Age of Onset  . Alzheimer's disease Father 4    Deceased  . Stomach cancer Father   . Heart attack Father   . Heart disease Father   . Skin cancer Mother     Facial-Living  . Alcohol abuse Sister     x2  . Mental illness Sister     x2  . Diabetes Maternal Aunt     x2  . Thyroid disease Maternal Aunt     x4  . Diabetes Maternal Uncle   . Tuberculosis Paternal Grandfather   . Tuberculosis Paternal Grandmother   . Alzheimer's disease Paternal Aunt   . Alzheimer's disease Paternal Uncle     History   Social History  . Marital Status: Married    Spouse Name: N/A    Number of Children: N/A  . Years of Education: N/A   Social History Main Topics  . Smoking status: Former Smoker -- 47 years    Types: Pipe  Quit date: 08/15/2013  . Smokeless tobacco: None  . Alcohol Use: Yes  . Drug Use: No  . Sexual Activity: Not Currently   Other Topics Concern  . None   Social History Narrative  . None   Review of Systems - See HPI.  All other ROS are negative.  BP 108/72  Pulse 74  Temp(Src) 98.2 F (36.8 C) (Oral)  Resp 16  Ht $R'6\' 1"'tu$  (1.854 m)  Wt 210 lb 8 oz (95.482 kg)  BMI 27.78 kg/m2  SpO2 97%  Physical Exam  Vitals reviewed. Constitutional: He is oriented to person, place, and time and well-developed, well-nourished, and in no distress.  HENT:  Head: Normocephalic and atraumatic.  Eyes: Conjunctivae are normal.  Neck: Normal range of motion. Neck supple.  Cardiovascular: Normal rate, regular rhythm, normal heart sounds and intact distal pulses.   Pulmonary/Chest: Effort normal and breath sounds normal. No respiratory distress. He has no wheezes. He has no rales. He  exhibits no tenderness.  Lymphadenopathy:    He has no cervical adenopathy.  Neurological: He is alert and oriented to person, place, and time. No cranial nerve deficit.  Skin: Skin is warm and dry. No rash noted.  Psychiatric: Affect normal.    Recent Results (from the past 2160 hour(s))  CBC WITH DIFFERENTIAL     Status: None   Collection Time    01/14/14 10:46 AM      Result Value Ref Range   WBC 5.9  4.0 - 10.5 K/uL   RBC 4.62  4.22 - 5.81 MIL/uL   Hemoglobin 14.7  13.0 - 17.0 g/dL   HCT 42.8  39.0 - 52.0 %   MCV 92.6  78.0 - 100.0 fL   MCH 31.8  26.0 - 34.0 pg   MCHC 34.3  30.0 - 36.0 g/dL   RDW 14.0  11.5 - 15.5 %   Platelets 278  150 - 400 K/uL   Neutrophils Relative % 57  43 - 77 %   Neutro Abs 3.4  1.7 - 7.7 K/uL   Lymphocytes Relative 27  12 - 46 %   Lymphs Abs 1.6  0.7 - 4.0 K/uL   Monocytes Relative 11  3 - 12 %   Monocytes Absolute 0.6  0.1 - 1.0 K/uL   Eosinophils Relative 4  0 - 5 %   Eosinophils Absolute 0.2  0.0 - 0.7 K/uL   Basophils Relative 1  0 - 1 %   Basophils Absolute 0.1  0.0 - 0.1 K/uL   Smear Review Criteria for review not met    BASIC METABOLIC PANEL WITH GFR     Status: Abnormal   Collection Time    01/14/14 10:46 AM      Result Value Ref Range   Sodium 137  135 - 145 mEq/L   Potassium 4.5  3.5 - 5.3 mEq/L   Chloride 103  96 - 112 mEq/L   CO2 27  19 - 32 mEq/L   Glucose, Bld 100 (*) 70 - 99 mg/dL   BUN 16  6 - 23 mg/dL   Creat 1.05  0.50 - 1.35 mg/dL   Calcium 9.3  8.4 - 10.5 mg/dL   GFR, Est African American 86     GFR, Est Non African American 75     Comment:       The estimated GFR is a calculation valid for adults (>=41 years old)     that uses the CKD-EPI algorithm to adjust for age  and sex. It is       not to be used for children, pregnant women, hospitalized patients,        patients on dialysis, or with rapidly changing kidney function.     According to the NKDEP, eGFR >89 is normal, 60-89 shows mild     impairment, 30-59 shows  moderate impairment, 15-29 shows severe     impairment and <15 is ESRD.        HEPATIC FUNCTION PANEL     Status: None   Collection Time    01/14/14 10:46 AM      Result Value Ref Range   Total Bilirubin 0.6  0.2 - 1.2 mg/dL   Bilirubin, Direct 0.1  0.0 - 0.3 mg/dL   Indirect Bilirubin 0.5  0.2 - 1.2 mg/dL   Alkaline Phosphatase 58  39 - 117 U/L   AST 20  0 - 37 U/L   ALT 35  0 - 53 U/L   Total Protein 6.5  6.0 - 8.3 g/dL   Albumin 4.2  3.5 - 5.2 g/dL  TSH     Status: None   Collection Time    01/14/14 10:46 AM      Result Value Ref Range   TSH 1.821  0.350 - 4.500 uIU/mL  T4, FREE     Status: None   Collection Time    01/14/14 10:46 AM      Result Value Ref Range   Free T4 1.21  0.80 - 1.80 ng/dL  VITAMIN D 1,25 DIHYDROXY     Status: None   Collection Time    01/14/14 10:46 AM      Result Value Ref Range   Vitamin D 1, 25 (OH)2 Total 26  18 - 72 pg/mL   Vitamin D3 1, 25 (OH)2 26     Vitamin D2 1, 25 (OH)2 <8     Comment: Vitamin D3, 1,25(OH)2 indicates both endogenous     production and supplementation.  Vitamin D2, 1,25(OH)2     is an indicator of exogeous sources, such as diet or     supplementation.  Interpretation and therapy are based     on measurement of Vitamin D,1,25(OH)2, Total.     This test was developed and its performance     characteristics have been determined by [facility], [place], [place].     Performance characteristics refer to the     analytical performance of the test.  METHYLMALONIC ACID, SERUM     Status: Abnormal   Collection Time    01/14/14 10:46 AM      Result Value Ref Range   Methylmalonic Acid, Quant 0.47 (*) <0.40 umol/L  LIPID PANEL     Status: Abnormal   Collection Time    01/14/14 10:46 AM      Result Value Ref Range   Cholesterol 214 (*) 0 - 200 mg/dL   Comment: ATP III Classification:           < 200        mg/dL        Desirable          200 - 239     mg/dL        Borderline High          >= 240         mg/dL        High         Triglycerides 251 (*) <  150 mg/dL   HDL 71  >39 mg/dL   Total CHOL/HDL Ratio 3.0     VLDL 50 (*) 0 - 40 mg/dL   LDL Cholesterol 93  0 - 99 mg/dL   Comment:       Total Cholesterol/HDL Ratio:CHD Risk                            Coronary Heart Disease Risk Table                                            Men       Women              1/2 Average Risk              3.4        3.3                  Average Risk              5.0        4.4               2X Average Risk              9.6        7.1               3X Average Risk             23.4       11.0     Use the calculated Patient Ratio above and the CHD Risk table      to determine the patient's CHD Risk.     ATP III Classification (LDL):           < 100        mg/dL         Optimal          100 - 129     mg/dL         Near or Above Optimal          130 - 159     mg/dL         Borderline High          160 - 189     mg/dL         High           > 190        mg/dL         Very High        URINALYSIS, ROUTINE W REFLEX MICROSCOPIC     Status: None   Collection Time    01/14/14 10:46 AM      Result Value Ref Range   Color, Urine YELLOW  YELLOW   APPearance CLEAR  CLEAR   Specific Gravity, Urine 1.017  1.005 - 1.030   pH 5.5  5.0 - 8.0   Glucose, UA NEG  NEG mg/dL   Bilirubin Urine NEG  NEG   Ketones, ur NEG  NEG mg/dL   Hgb urine dipstick NEG  NEG   Protein, ur NEG  NEG mg/dL   Urobilinogen, UA 0.2  0.0 - 1.0 mg/dL   Nitrite NEG  NEG   Leukocytes, UA NEG  NEG  HEMOGLOBIN A1C     Status:  Abnormal   Collection Time    04/05/14 11:45 AM      Result Value Ref Range   Hemoglobin A1C 6.0 (*) <5.7 %   Comment:                                                                            According to the ADA Clinical Practice Recommendations for 2011, when     HbA1c is used as a screening test:             >=6.5%   Diagnostic of Diabetes Mellitus                (if abnormal result is confirmed)            5.7-6.4%   Increased risk of developing Diabetes Mellitus           References:Diagnosis and Classification of Diabetes Mellitus,Diabetes     SJGG,8366,29(UTMLY 1):S62-S69 and Standards of Medical Care in             Diabetes - 2011,Diabetes Care,2011,34 (Suppl 1):S11-S61.         Mean Plasma Glucose 126 (*) <117 mg/dL  URINALYSIS, ROUTINE W REFLEX MICROSCOPIC     Status: None   Collection Time    04/06/14  1:26 AM      Result Value Ref Range   Color, Urine YELLOW  YELLOW   APPearance CLEAR  CLEAR   Specific Gravity, Urine 1.015  1.005 - 1.030   pH 5.5  5.0 - 8.0   Glucose, UA NEGATIVE  NEGATIVE mg/dL   Hgb urine dipstick NEGATIVE  NEGATIVE   Bilirubin Urine NEGATIVE  NEGATIVE   Ketones, ur NEGATIVE  NEGATIVE mg/dL   Protein, ur NEGATIVE  NEGATIVE mg/dL   Urobilinogen, UA 0.2  0.0 - 1.0 mg/dL   Nitrite NEGATIVE  NEGATIVE   Leukocytes, UA NEGATIVE  NEGATIVE   Comment: MICROSCOPIC NOT DONE ON URINES WITH NEGATIVE PROTEIN, BLOOD, LEUKOCYTES, NITRITE, OR GLUCOSE <1000 mg/dL.  CBC WITH DIFFERENTIAL     Status: None   Collection Time    04/06/14  2:16 AM      Result Value Ref Range   WBC 6.8  4.0 - 10.5 K/uL   RBC 4.35  4.22 - 5.81 MIL/uL   Hemoglobin 14.4  13.0 - 17.0 g/dL   HCT 41.2  39.0 - 52.0 %   MCV 94.7  78.0 - 100.0 fL   MCH 33.1  26.0 - 34.0 pg   MCHC 35.0  30.0 - 36.0 g/dL   RDW 12.7  11.5 - 15.5 %   Platelets 285  150 - 400 K/uL   Neutrophils Relative % 64  43 - 77 %   Neutro Abs 4.3  1.7 - 7.7 K/uL   Lymphocytes Relative 19  12 - 46 %   Lymphs Abs 1.3  0.7 - 4.0 K/uL   Monocytes Relative 11  3 - 12 %   Monocytes Absolute 0.7  0.1 - 1.0 K/uL   Eosinophils Relative 5  0 - 5 %   Eosinophils Absolute 0.3  0.0 - 0.7 K/uL   Basophils Relative 1  0 - 1 %   Basophils Absolute 0.1  0.0 -  0.1 K/uL  COMPREHENSIVE METABOLIC PANEL     Status: Abnormal   Collection Time    04/06/14  2:16 AM      Result Value Ref Range   Sodium 134 (*) 137 - 147 mEq/L   Potassium 4.2  3.7 -  5.3 mEq/L   Chloride 97  96 - 112 mEq/L   CO2 23  19 - 32 mEq/L   Glucose, Bld 138 (*) 70 - 99 mg/dL   BUN 18  6 - 23 mg/dL   Creatinine, Ser 1.30  0.50 - 1.35 mg/dL   Calcium 9.8  8.4 - 10.5 mg/dL   Total Protein 7.4  6.0 - 8.3 g/dL   Albumin 4.2  3.5 - 5.2 g/dL   AST 24  0 - 37 U/L   ALT 28  0 - 53 U/L   Alkaline Phosphatase 69  39 - 117 U/L   Total Bilirubin 0.5  0.3 - 1.2 mg/dL   GFR calc non Af Amer 56 (*) >90 mL/min   GFR calc Af Amer 65 (*) >90 mL/min   Comment: (NOTE)     The eGFR has been calculated using the CKD EPI equation.     This calculation has not been validated in all clinical situations.     eGFR's persistently <90 mL/min signify possible Chronic Kidney     Disease.  LIPASE, BLOOD     Status: None   Collection Time    04/06/14  2:16 AM      Result Value Ref Range   Lipase 45  11 - 59 U/L    Assessment/Plan: Neuropathy Will attempt trial of gabapentin.   Concussion with no loss of consciousness Patient doing well.  PE unremarkable.  Discussed course of mild concussions including post-concussion syndrome.  No further management needed at present.

## 2014-04-05 NOTE — Patient Instructions (Addendum)
Please obtain labs.  I will call you with your results.  Increase your fluid intake.  Switch to G2 Gatorade.  Start Gabapentin as directed for nerve pain.  Follow-up in 3-4 weeks so we can reassess symptoms.   Neuropathic Pain We often think that pain has a physical cause. If we get rid of the cause, the pain should go away. Nerves themselves can also cause pain. It is called neuropathic pain, which means nerve abnormality. It may be difficult for the patients who have it and for the treating caregivers. Pain is usually described as acute (short-lived) or chronic (long-lasting). Acute pain is related to the physical sensations caused by an injury. It can last from a few seconds to many weeks, but it usually goes away when normal healing occurs. Chronic pain lasts beyond the typical healing time. With neuropathic pain, the nerve fibers themselves may be damaged or injured. They then send incorrect signals to other pain centers. The pain you feel is real, but the cause is not easy to find.  CAUSES  Chronic pain can result from diseases, such as diabetes and shingles (an infection related to chickenpox), or from trauma, surgery, or amputation. It can also happen without any known injury or disease. The nerves are sending pain messages, even though there is no identifiable cause for such messages.   Other common causes of neuropathy include diabetes, phantom limb pain, or Regional Pain Syndrome (RPS).  As with all forms of chronic back pain, if neuropathy is not correctly treated, there can be a number of associated problems that lead to a downward cycle for the patient. These include depression, sleeplessness, feelings of fear and anxiety, limited social interaction and inability to do normal daily activities or work.  The most dramatic and mysterious example of neuropathic pain is called "phantom limb syndrome." This occurs when an arm or a leg has been removed because of illness or injury. The brain still  gets pain messages from the nerves that originally carried impulses from the missing limb. These nerves now seem to misfire and cause troubling pain.  Neuropathic pain often seems to have no cause. It responds poorly to standard pain treatment. Neuropathic pain can occur after:  Shingles (herpes zoster virus infection).  A lasting burning sensation of the skin, caused usually by injury to a peripheral nerve.  Peripheral neuropathy which is widespread nerve damage, often caused by diabetes or alcoholism.  Phantom limb pain following an amputation.  Facial nerve problems (trigeminal neuralgia).  Multiple sclerosis.  Reflex sympathetic dystrophy.  Pain which comes with cancer and cancer chemotherapy.  Entrapment neuropathy such as when pressure is put on a nerve such as in carpal tunnel syndrome.  Back, leg, and hip problems (sciatica).  Spine or back surgery.  HIV Infection or AIDS where nerves are infected by viruses. Your caregiver can explain items in the above list which may apply to you. SYMPTOMS  Characteristics of neuropathic pain are:  Severe, sharp, electric shock-like, shooting, lightening-like, knife-like.  Pins and needles sensation.  Deep burning, deep cold, or deep ache.  Persistent numbness, tingling, or weakness.  Pain resulting from light touch or other stimulus that would not usually cause pain.  Increased sensitivity to something that would normally cause pain, such as a pinprick. Pain may persist for months or years following the healing of damaged tissues. When this happens, pain signals no longer sound an alarm about current injuries or injuries about to happen. Instead, the alarm system itself is not  working correctly.  Neuropathic pain may get worse instead of better over time. For some people, it can lead to serious disability. It is important to be aware that severe injury in a limb can occur without a proper, protective pain response.Burns, cuts,  and other injuries may go unnoticed. Without proper treatment, these injuries can become infected or lead to further disability. Take any injury seriously, and consult your caregiver for treatment. DIAGNOSIS  When you have a pain with no known cause, your caregiver will probably ask some specific questions:   Do you have any other conditions, such as diabetes, shingles, multiple sclerosis, or HIV infection?  How would you describe your pain? (Neuropathic pain is often described as shooting, stabbing, burning, or searing.)  Is your pain worse at any time of the day? (Neuropathic pain is usually worse at night.)  Does the pain seem to follow a certain physical pathway?  Does the pain come from an area that has missing or injured nerves? (An example would be phantom limb pain.)  Is the pain triggered by minor things such as rubbing against the sheets at night? These questions often help define the type of pain involved. Once your caregiver knows what is happening, treatment can begin. Anticonvulsant, antidepressant drugs, and various pain relievers seem to work in some cases. If another condition, such as diabetes is involved, better management of that disorder may relieve the neuropathic pain.  TREATMENT  Neuropathic pain is frequently long-lasting and tends not to respond to treatment with narcotic type pain medication. It may respond well to other drugs such as antiseizure and antidepressant medications. Usually, neuropathic problems do not completely go away, but partial improvement is often possible with proper treatment. Your caregivers have large numbers of medications available to treat you. Do not be discouraged if you do not get immediate relief. Sometimes different medications or a combination of medications will be tried before you receive the results you are hoping for. See your caregiver if you have pain that seems to be coming from nowhere and does not go away. Help is available.  SEEK  IMMEDIATE MEDICAL CARE IF:   There is a sudden change in the quality of your pain, especially if the change is on only one side of the body.  You notice changes of the skin, such as redness, black or purple discoloration, swelling, or an ulcer.  You cannot move the affected limbs. Document Released: 06/28/2004 Document Revised: 12/24/2011 Document Reviewed: 06/28/2004 [facility] Patient Information 2015 [place], [place]. This information is not intended to replace advice given to you by your health care provider. Make sure you discuss any questions you have with your health care provider.

## 2014-04-06 ENCOUNTER — Emergency Department (HOSPITAL_BASED_OUTPATIENT_CLINIC_OR_DEPARTMENT_OTHER)
Admission: EM | Admit: 2014-04-06 | Discharge: 2014-04-06 | Disposition: A | Discharge status: Home / Self Care | Payer: Medicare HMO | Attending: Emergency Medicine | Admitting: Emergency Medicine

## 2014-04-06 ENCOUNTER — Encounter (HOSPITAL_BASED_OUTPATIENT_CLINIC_OR_DEPARTMENT_OTHER): Payer: Self-pay | Admitting: Emergency Medicine

## 2014-04-06 ENCOUNTER — Telehealth: Payer: Self-pay | Admitting: Physician Assistant

## 2014-04-06 DIAGNOSIS — I10 Essential (primary) hypertension: Secondary | ICD-10-CM | POA: Insufficient documentation

## 2014-04-06 DIAGNOSIS — M1610 Unilateral primary osteoarthritis, unspecified hip: Secondary | ICD-10-CM | POA: Insufficient documentation

## 2014-04-06 DIAGNOSIS — W92XXXA Exposure to excessive heat of man-made origin, initial encounter: Secondary | ICD-10-CM | POA: Insufficient documentation

## 2014-04-06 DIAGNOSIS — I209 Angina pectoris, unspecified: Secondary | ICD-10-CM | POA: Insufficient documentation

## 2014-04-06 DIAGNOSIS — Z8546 Personal history of malignant neoplasm of prostate: Secondary | ICD-10-CM | POA: Insufficient documentation

## 2014-04-06 DIAGNOSIS — Z79899 Other long term (current) drug therapy: Secondary | ICD-10-CM | POA: Insufficient documentation

## 2014-04-06 DIAGNOSIS — R5383 Other fatigue: Secondary | ICD-10-CM

## 2014-04-06 DIAGNOSIS — R5381 Other malaise: Secondary | ICD-10-CM | POA: Insufficient documentation

## 2014-04-06 DIAGNOSIS — R42 Dizziness and giddiness: Secondary | ICD-10-CM | POA: Insufficient documentation

## 2014-04-06 DIAGNOSIS — T673XXA Heat exhaustion, anhydrotic, initial encounter: Secondary | ICD-10-CM | POA: Insufficient documentation

## 2014-04-06 DIAGNOSIS — IMO0002 Reserved for concepts with insufficient information to code with codable children: Secondary | ICD-10-CM | POA: Insufficient documentation

## 2014-04-06 DIAGNOSIS — Z8619 Personal history of other infectious and parasitic diseases: Secondary | ICD-10-CM | POA: Insufficient documentation

## 2014-04-06 DIAGNOSIS — R300 Dysuria: Secondary | ICD-10-CM

## 2014-04-06 DIAGNOSIS — Z880 Allergy status to penicillin: Secondary | ICD-10-CM | POA: Insufficient documentation

## 2014-04-06 DIAGNOSIS — Y9269 Other specified industrial and construction area as the place of occurrence of the external cause: Secondary | ICD-10-CM | POA: Insufficient documentation

## 2014-04-06 DIAGNOSIS — J449 Chronic obstructive pulmonary disease, unspecified: Secondary | ICD-10-CM | POA: Insufficient documentation

## 2014-04-06 DIAGNOSIS — R197 Diarrhea, unspecified: Secondary | ICD-10-CM | POA: Insufficient documentation

## 2014-04-06 DIAGNOSIS — M169 Osteoarthritis of hip, unspecified: Secondary | ICD-10-CM | POA: Insufficient documentation

## 2014-04-06 DIAGNOSIS — R110 Nausea: Secondary | ICD-10-CM | POA: Insufficient documentation

## 2014-04-06 DIAGNOSIS — T675XXA Heat exhaustion, unspecified, initial encounter: Secondary | ICD-10-CM

## 2014-04-06 DIAGNOSIS — Z87442 Personal history of urinary calculi: Secondary | ICD-10-CM | POA: Insufficient documentation

## 2014-04-06 DIAGNOSIS — Y9389 Activity, other specified: Secondary | ICD-10-CM | POA: Insufficient documentation

## 2014-04-06 DIAGNOSIS — J4489 Other specified chronic obstructive pulmonary disease: Secondary | ICD-10-CM | POA: Insufficient documentation

## 2014-04-06 DIAGNOSIS — K219 Gastro-esophageal reflux disease without esophagitis: Secondary | ICD-10-CM | POA: Insufficient documentation

## 2014-04-06 DIAGNOSIS — E860 Dehydration: Secondary | ICD-10-CM

## 2014-04-06 DIAGNOSIS — R638 Other symptoms and signs concerning food and fluid intake: Secondary | ICD-10-CM | POA: Insufficient documentation

## 2014-04-06 DIAGNOSIS — R609 Edema, unspecified: Secondary | ICD-10-CM | POA: Insufficient documentation

## 2014-04-06 DIAGNOSIS — Z87891 Personal history of nicotine dependence: Secondary | ICD-10-CM | POA: Insufficient documentation

## 2014-04-06 DIAGNOSIS — Z8611 Personal history of tuberculosis: Secondary | ICD-10-CM | POA: Insufficient documentation

## 2014-04-06 LAB — COMPREHENSIVE METABOLIC PANEL
ALT: 28 U/L (ref 0–53)
AST: 24 U/L (ref 0–37)
Albumin: 4.2 g/dL (ref 3.5–5.2)
Alkaline Phosphatase: 69 U/L (ref 39–117)
BUN: 18 mg/dL (ref 6–23)
CO2: 23 meq/L (ref 19–32)
Calcium: 9.8 mg/dL (ref 8.4–10.5)
Chloride: 97 mEq/L (ref 96–112)
Creatinine, Ser: 1.3 mg/dL (ref 0.50–1.35)
GFR, EST AFRICAN AMERICAN: 65 mL/min — AB (ref >90)
GFR, EST NON AFRICAN AMERICAN: 56 mL/min — AB (ref >90)
GLUCOSE: 138 mg/dL — AB (ref 70–99)
Potassium: 4.2 mEq/L (ref 3.7–5.3)
SODIUM: 134 meq/L — AB (ref 137–147)
TOTAL PROTEIN: 7.4 g/dL (ref 6.0–8.3)
Total Bilirubin: 0.5 mg/dL (ref 0.3–1.2)

## 2014-04-06 LAB — CBC WITH DIFFERENTIAL/PLATELET
Basophils Absolute: 0.1 10*3/uL (ref 0.0–0.1)
Basophils Relative: 1 % (ref 0–1)
EOS ABS: 0.3 10*3/uL (ref 0.0–0.7)
Eosinophils Relative: 5 % (ref 0–5)
HCT: 41.2 % (ref 39.0–52.0)
HEMOGLOBIN: 14.4 g/dL (ref 13.0–17.0)
LYMPHS ABS: 1.3 10*3/uL (ref 0.7–4.0)
LYMPHS PCT: 19 % (ref 12–46)
MCH: 33.1 pg (ref 26.0–34.0)
MCHC: 35 g/dL (ref 30.0–36.0)
MCV: 94.7 fL (ref 78.0–100.0)
MONOS PCT: 11 % (ref 3–12)
Monocytes Absolute: 0.7 10*3/uL (ref 0.1–1.0)
Neutro Abs: 4.3 10*3/uL (ref 1.7–7.7)
Neutrophils Relative %: 64 % (ref 43–77)
PLATELETS: 285 10*3/uL (ref 150–400)
RBC: 4.35 MIL/uL (ref 4.22–5.81)
RDW: 12.7 % (ref 11.5–15.5)
WBC: 6.8 10*3/uL (ref 4.0–10.5)

## 2014-04-06 LAB — URINALYSIS, ROUTINE W REFLEX MICROSCOPIC
Bilirubin Urine: NEGATIVE
Glucose, UA: NEGATIVE mg/dL
HGB URINE DIPSTICK: NEGATIVE
Ketones, ur: NEGATIVE mg/dL
Leukocytes, UA: NEGATIVE
Nitrite: NEGATIVE
PH: 5.5 (ref 5.0–8.0)
Protein, ur: NEGATIVE mg/dL
SPECIFIC GRAVITY, URINE: 1.015 (ref 1.005–1.030)
Urobilinogen, UA: 0.2 mg/dL (ref 0.0–1.0)

## 2014-04-06 LAB — LIPASE, BLOOD: LIPASE: 45 U/L (ref 11–59)

## 2014-04-06 MED ORDER — SODIUM CHLORIDE 0.9 % IV BOLUS (SEPSIS)
1000.0000 mL | Freq: Once | INTRAVENOUS | Status: AC
Start: 2014-04-06 — End: 2014-04-06
  Administered 2014-04-06: 1000 mL via INTRAVENOUS

## 2014-04-06 MED ORDER — ONDANSETRON HCL 4 MG/2ML IJ SOLN
4.0000 mg | Freq: Once | INTRAMUSCULAR | Status: AC
  Administered 2014-04-06: 4 mg via INTRAVENOUS
  Filled 2014-04-06: qty 2

## 2014-04-06 NOTE — ED Notes (Signed)
Pt. Reports he has been working outside today and felt weak and tired and was seen upstairs by MD.  Pt. Reports he had labs done upstairs and they reported to him that he is dehydrated.  Pt. Reports las urinating at 5pm.  Pt. Has reports diarrhea x 3 today and no vomiting but is nauseated.

## 2014-04-06 NOTE — Discharge Instructions (Signed)
If you were given medicines take as directed.  If you are on coumadin or contraceptives realize their levels and effectiveness is altered by many different medicines.  If you have any reaction (rash, tongues swelling, other) to the medicines stop taking and see a physician.   Please follow up as directed and return to the ER or see a physician for new or worsening symptoms.  Thank you. Filed Vitals:   04/06/14 0051  BP: 110/74  Pulse: 99  Temp: 98.2 F (36.8 C)  TempSrc: Oral  Resp: 20  Height: 6\' 1"  (1.854 m)  Weight: 210 lb (95.255 kg)  SpO2: 100%

## 2014-04-06 NOTE — Telephone Encounter (Signed)
Patient Information:  Caller Name: Koury  Phone: [phone]  Patient: [name], [name]  Gender: Male  DOB: 1951/06/27  Age: 63 Years  PCP: Raiford Noble "Einar Pheasant"  Office Follow Up:  Does the office need to follow up with this patient?: No  Instructions For The Office: N/A  RN Note:  Been resting most of the day in air conditioning after ED treatment for heat exhaustion. Voiding more volume than intake.  Reports recurrent right periumbilical abdominal pain present now for past 2 hours.  Pain rated 5-6/10. Since office closes at 1600, its too late to be seen in office; advised to have wife drive him back to ED for evaluation now.  Symptoms  Reason For Call & Symptoms: Emergent Call:  Diagnosed with heat exhaustion at [facility] ED 04/05/14 after working outside in [employer] post [place].  Treated with IV hydration and oral hydration.  Vomiting stopped.  Mild diarrhea present.  Reports tongue is dry inspite of drinking fluids.  Reviewed Health History In EMR: Yes  Reviewed Medications In EMR: Yes  Reviewed Allergies In EMR: Yes  Reviewed Surgeries / Procedures: Yes  Date of Onset of Symptoms: 04/06/2014  Treatments Tried: Drinking water and low sugar Gatorade DG  Treatments Tried Worked: No  Guideline(s) Used:  Heat Exposure ([employer] and Heat Stroke)  Abdominal Pain - Male  Disposition Per Guideline:   Go to ED Now (or to Office with PCP Approval)  Reason For Disposition Reached:   Constant abdominal pain lasting > 2 hours  Advice Given:  Call Back If:  Abdominal pain is constant and present for more than 2 hours  Abdominal pains come and go and are present for more than 24 hours  You become worse.  RN Overrode Recommendation:  Go To [facility]  Since office is closes at 1600, advised to go to ED/UC now.

## 2014-04-06 NOTE — ED Notes (Signed)
MD at bedside. 

## 2014-04-06 NOTE — ED Provider Notes (Signed)
CSN: [id number]     Arrival date & time 04/06/14  0041 History   First MD Initiated Contact with Patient 04/06/14 0116     Chief Complaint  Patient presents with  . Dysuria     (Consider location/radiation/quality/duration/timing/severity/associated sxs/prior Treatment) HPI Comments: 44 old male with COPD, high blood pressure, reflux, past smoker, prostate cancer without active treatment, personality disorder, kidney stones, alcohol abuse and alcoholic gastritis presents with general malaise and dysuria. He should and was outside working in the heat all day today and felt fine until this evening he started to feel general malaise and dehydrated. Decreased urination however patient felt he was keeping up with oral fluids. Patient started to have 3 episodes of watery nonbloody diarrhea with a recent antibiotics. Mild abdominal cramping. Patient drinks alcohol daily for many years. No vomiting blood. Symptoms intermittent. Patient has history of urine infections but no history of prostatitis.  Patient is a 63 y.o. male presenting with dysuria. The history is provided by the patient.  Dysuria Associated symptoms include abdominal pain (cramping lower). Pertinent negatives include no chest pain, no headaches and no shortness of breath.    Past Medical History  Diagnosis Date  . COPD (chronic obstructive pulmonary disease)   . Hypertension   . Polio   . Bronchitis   . DJD (degenerative joint disease) of hip   . Angina pectoris   . Tuberculosis   . Prostate cancer   . Chicken pox   . Rheumatic fever   . Measles   . Mumps   . GERD (gastroesophageal reflux disease)   . Alcoholism    Past Surgical History  Procedure Laterality Date  . Rotator cuff repair    . Other surgical history       Muscle & bone Graft/Polio  . Cholecystectomy    . Nose surgery    . Nasal septum surgery    . Kidney stone surgery    . Hand surgery    . Shoulder surgery      Arthritis & Bone Spurs  . Dental  surgery     Family History  Problem Relation Age of Onset  . Alzheimer's disease Father 4    Deceased  . Stomach cancer Father   . Heart attack Father   . Heart disease Father   . Skin cancer Mother     Facial-Living  . Alcohol abuse Sister     x2  . Mental illness Sister     x2  . Diabetes Maternal Aunt     x2  . Thyroid disease Maternal Aunt     x4  . Diabetes Maternal Uncle   . Tuberculosis Paternal Grandfather   . Tuberculosis Paternal Grandmother   . Alzheimer's disease Paternal Aunt   . Alzheimer's disease Paternal Uncle    History  Substance Use Topics  . Smoking status: Former Smoker -- 32 years    Types: Pipe    Quit date: 08/15/2013  . Smokeless tobacco: Not on file  . Alcohol Use: Yes    Review of Systems  Constitutional: Positive for appetite change and fatigue. Negative for fever and chills.  HENT: Negative for congestion.   Eyes: Negative for visual disturbance.  Respiratory: Negative for shortness of breath.   Cardiovascular: Negative for chest pain.  Gastrointestinal: Positive for nausea, abdominal pain (cramping lower) and diarrhea. Negative for vomiting.  Genitourinary: Positive for dysuria. Negative for flank pain.  Musculoskeletal: Negative for back pain, neck pain and neck stiffness.  Skin: Negative  for rash.  Neurological: Positive for light-headedness. Negative for headaches.      Allergies  Ivp dye; Penicillins; Aspirin; Morphine and related; and Nsaids  Home Medications   Prior to Admission medications   Medication Sig Start Date End Date Taking? Authorizing Provider  albuterol (PROVENTIL HFA;VENTOLIN HFA) 108 (90 BASE) MCG/ACT inhaler Inhale into the lungs every 6 (six) hours as needed for wheezing or shortness of breath. PROAIR    Historical Provider, MD  albuterol (PROVENTIL) (2.5 MG/3ML) 0.083% nebulizer solution Take 2.5 mg by nebulization every 6 (six) hours as needed for wheezing or shortness of breath.    Historical Provider,  MD  amLODipine (NORVASC) 10 MG tablet TAKE 1 TABLET (10 MG TOTAL) BY MOUTH DAILY. 04/02/14   Leeanne Rio, PA-C  EPINEPHrine (EPIPEN) 0.3 mg/0.3 mL IJ SOAJ injection Inject 0.3 mLs (0.3 mg total) into the muscle once. 04/05/14   Leeanne Rio, PA-C  famotidine (PEPCID) 20 MG tablet Take 1 tablet (20 mg total) by mouth 2 (two) times daily. 03/10/14   Leeanne Rio, PA-C  Fluticasone Furoate-Vilanterol (BREO ELLIPTA) 100-25 MCG/INH AEPB Inhale 1 puff into the lungs every morning.    Historical Provider, MD  gabapentin (NEURONTIN) 300 MG capsule One tab PO daily for a week, then twice daily for a week, then three times daily. 04/05/14   Leeanne Rio, PA-C  HYDROcodone-acetaminophen [facility]) 10-325 MG per tablet Take 1 tablet by mouth every 8 (eight) hours as needed. 02/22/14   Leeanne Rio, PA-C  loratadine (CLARITIN) 10 MG tablet TAKE 1 TABLET BY MOUTH EVERY DAY 01/25/14   Leeanne Rio, PA-C  losartan (COZAAR) 50 MG tablet TAKE 1 TABLET (50 MG TOTAL) BY MOUTH DAILY. 04/02/14   Leeanne Rio, PA-C  montelukast (SINGULAIR) 10 MG tablet Take 10 mg by mouth at bedtime.    Historical Provider, MD  nitroGLYCERIN (NITROSTAT) 0.4 MG SL tablet Place 0.4 mg under the tongue every 5 (five) minutes as needed. For chest pain    Historical Provider, MD   BP 110/74  Pulse 99  Temp(Src) 98.2 F (36.8 C) (Oral)  Resp 20  Ht 6\' 1"  (1.854 m)  Wt 210 lb (95.255 kg)  BMI 27.71 kg/m2  SpO2 100% Physical Exam  Nursing note and vitals reviewed. Constitutional: He is oriented to person, place, and time. He appears well-developed and well-nourished.  HENT:  Head: Normocephalic and atraumatic.  Mild diabetes membranes  Eyes: Conjunctivae are normal. Right eye exhibits no discharge. Left eye exhibits no discharge.  Neck: Normal range of motion. Neck supple. No tracheal deviation present.  Cardiovascular: Normal rate and regular rhythm.   Pulmonary/Chest: Effort normal and breath  sounds normal.  Abdominal: Soft. He exhibits no distension. There is tenderness (very mild central abdomen worse lower). There is no guarding.  Musculoskeletal: He exhibits edema (very mild lower extremities bilateral).  Neurological: He is alert and oriented to person, place, and time. No cranial nerve deficit.  Skin: Skin is warm. No rash noted.  Psychiatric: He has a normal mood and affect.    ED Course  Procedures (including critical care time) Labs Review Labs Reviewed  COMPREHENSIVE METABOLIC PANEL - Abnormal; Notable for the following:    Sodium 134 (*)    Glucose, Bld 138 (*)    GFR calc non Af Amer 56 (*)    GFR calc Af Amer 65 (*)    All other components within normal limits  URINALYSIS, ROUTINE W REFLEX MICROSCOPIC  CBC  WITH DIFFERENTIAL  LIPASE, BLOOD    Imaging Review No results found.   EKG Interpretation None      MDM   Final diagnoses:  Heat exhaustion, initial encounter  Dehydration  Dysuria   diarrhea  Clinically patient presented with dehydration and general malaise. Plan for screening blood work, IV fluids and Zofran. With dysuria prostate exam performed and no tenderness. Urine unremarkable.  Patient well-appearing on recheck and symptoms resolved. Followup outpatient discussed Results and differential diagnosis were discussed with the patient/parent/guardian. Close follow up outpatient was discussed, comfortable with the plan.   Medications  sodium chloride 0.9 % bolus 1,000 mL (1,000 mLs Intravenous New Bag/Given 04/06/14 0214)  ondansetron (ZOFRAN) injection 4 mg (4 mg Intravenous Given 04/06/14 0215)    Filed Vitals:   04/06/14 0051  BP: 110/74  Pulse: 99  Temp: 98.2 F (36.8 C)  TempSrc: Oral  Resp: 20  Height: 6\' 1"  (1.854 m)  Weight: 210 lb (95.255 kg)  SpO2: 100%         Mariea Clonts, MD 04/06/14 [phone]

## 2014-04-06 NOTE — ED Notes (Signed)
D/c home with ride 

## 2014-04-07 NOTE — Telephone Encounter (Signed)
FYI

## 2014-04-09 ENCOUNTER — Ambulatory Visit (INDEPENDENT_AMBULATORY_CARE_PROVIDER_SITE_OTHER): Payer: Medicare HMO | Admitting: Physician Assistant

## 2014-04-09 ENCOUNTER — Encounter: Payer: Self-pay | Admitting: Physician Assistant

## 2014-04-09 VITALS — BP 110/68 | HR 79 | Temp 98.6°F | Resp 16 | Ht 73.0 in | Wt 211.5 lb

## 2014-04-09 DIAGNOSIS — F419 Anxiety disorder, unspecified: Secondary | ICD-10-CM

## 2014-04-09 DIAGNOSIS — G589 Mononeuropathy, unspecified: Secondary | ICD-10-CM

## 2014-04-09 DIAGNOSIS — E860 Dehydration: Secondary | ICD-10-CM

## 2014-04-09 DIAGNOSIS — G629 Polyneuropathy, unspecified: Secondary | ICD-10-CM

## 2014-04-09 DIAGNOSIS — K219 Gastro-esophageal reflux disease without esophagitis: Secondary | ICD-10-CM

## 2014-04-09 DIAGNOSIS — F411 Generalized anxiety disorder: Secondary | ICD-10-CM

## 2014-04-09 DIAGNOSIS — G894 Chronic pain syndrome: Secondary | ICD-10-CM

## 2014-04-09 MED ORDER — ALPRAZOLAM 0.5 MG PO TABS: 0.5000 mg | ORAL_TABLET | Freq: Two times a day (BID) | ORAL | Status: DC | PRN

## 2014-04-09 MED ORDER — HYDROCODONE-ACETAMINOPHEN 10-325 MG PO TABS: 1.0000 | ORAL_TABLET | Freq: Three times a day (TID) | ORAL | Status: DC | PRN

## 2014-04-09 NOTE — Progress Notes (Signed)
Pre visit review using our clinic review tool, if applicable. No additional management support is needed unless otherwise documented below in the visit note/SLS  

## 2014-04-09 NOTE — Patient Instructions (Signed)
Please stay well hydrated.  Stop Gabapentin as I think it is possibly worsening your acid reflux. Take Norco for pain.  Take Xanax as directed, only if needed for anxiety. Please consider allowing me to send you to Neurology for a formal dementia evaluation.  Follow-up in 3 months.

## 2014-04-11 DIAGNOSIS — G629 Polyneuropathy, unspecified: Secondary | ICD-10-CM | POA: Insufficient documentation

## 2014-04-11 DIAGNOSIS — S060X0A Concussion without loss of consciousness, initial encounter: Secondary | ICD-10-CM | POA: Insufficient documentation

## 2014-04-11 NOTE — Assessment & Plan Note (Signed)
Patient doing well.  PE unremarkable.  Discussed course of mild concussions including post-concussion syndrome.  No further management needed at present.

## 2014-04-11 NOTE — Assessment & Plan Note (Signed)
Will attempt trial of gabapentin.

## 2014-04-13 DIAGNOSIS — E860 Dehydration: Secondary | ICD-10-CM | POA: Insufficient documentation

## 2014-04-13 NOTE — Assessment & Plan Note (Signed)
Will stop Gabapentin as it is likely contributing to GERD exacerbation.  Continue medication regimen.  Cease alcohol consumption. Avoid trigger foods. Avoid late-night eating.

## 2014-04-13 NOTE — Assessment & Plan Note (Signed)
Patient clinically stable.  BP and pulse good.  No sign of dehydration on examination.  Reiterated importance of staying hydrated and avoidance of excess heat exposure.

## 2014-04-13 NOTE — Assessment & Plan Note (Signed)
Gabapentin D/C due to side effect.  Refilled Norco. Encouraged smoking and alcohol cessation.

## 2014-04-13 NOTE — Progress Notes (Signed)
Patient presents to clinic today for ER follow-up of dehydration.  Patient was working outside for several hours without adequate hydration and began feeling very fatigued, dry mouthed and lightheaded.  Patient was seen in ER and given IVF.  Patient doing well since discharge.  Is trying to stay well hydrated.  Does have complaints of severe worsening of GERD after starting Gabapentin.  Denies epigastric pain, vomiting or change in bowel habits.   Past Medical History  Diagnosis Date  . COPD (chronic obstructive pulmonary disease)   . Hypertension   . Polio   . Bronchitis   . DJD (degenerative joint disease) of hip   . Angina pectoris   . Tuberculosis   . Prostate cancer   . Chicken pox   . Rheumatic fever   . Measles   . Mumps   . GERD (gastroesophageal reflux disease)   . Alcoholism     Current Outpatient Prescriptions on File Prior to Visit  Medication Sig Dispense Refill  . albuterol (PROVENTIL HFA;VENTOLIN HFA) 108 (90 BASE) MCG/ACT inhaler Inhale into the lungs every 6 (six) hours as needed for wheezing or shortness of breath. PROAIR      . albuterol (PROVENTIL) (2.5 MG/3ML) 0.083% nebulizer solution Take 2.5 mg by nebulization every 6 (six) hours as needed for wheezing or shortness of breath.      Marland Kitchen amLODipine (NORVASC) 10 MG tablet TAKE 1 TABLET (10 MG TOTAL) BY MOUTH DAILY.  90 tablet  0  . EPINEPHrine (EPIPEN) 0.3 mg/0.3 mL IJ SOAJ injection Inject 0.3 mLs (0.3 mg total) into the muscle once.  1 Device  2  . famotidine (PEPCID) 20 MG tablet Take 1 tablet (20 mg total) by mouth 2 (two) times daily.  60 tablet  1  . Fluticasone Furoate-Vilanterol (BREO ELLIPTA) 100-25 MCG/INH AEPB Inhale 1 puff into the lungs every morning.      . gabapentin (NEURONTIN) 300 MG capsule One tab PO daily for a week, then twice daily for a week, then three times daily.  180 capsule  3  . loratadine (CLARITIN) 10 MG tablet TAKE 1 TABLET BY MOUTH EVERY DAY  30 tablet  0  . losartan (COZAAR) 50 MG  tablet TAKE 1 TABLET (50 MG TOTAL) BY MOUTH DAILY.  90 tablet  0  . montelukast (SINGULAIR) 10 MG tablet Take 10 mg by mouth at bedtime.      . nitroGLYCERIN (NITROSTAT) 0.4 MG SL tablet Place 0.4 mg under the tongue every 5 (five) minutes as needed. For chest pain       No current facility-administered medications on file prior to visit.    Allergies  Allergen Reactions  . Ivp Dye [Iodinated Diagnostic Agents] Anaphylaxis  . Penicillins Anaphylaxis    Heart stops  . Aspirin Other (See Comments)    Reaction unknown  . Morphine And Related Nausea And Vomiting  . Nsaids Other (See Comments)    Family History  Problem Relation Age of Onset  . Alzheimer's disease Father 4    Deceased  . Stomach cancer Father   . Heart attack Father   . Heart disease Father   . Skin cancer Mother     Facial-Living  . Alcohol abuse Sister     x2  . Mental illness Sister     x2  . Diabetes Maternal Aunt     x2  . Thyroid disease Maternal Aunt     x4  . Diabetes Maternal Uncle   . Tuberculosis Paternal Grandfather   .  Tuberculosis Paternal Grandmother   . Alzheimer's disease Paternal Aunt   . Alzheimer's disease Paternal Uncle     History   Social History  . Marital Status: Married    Spouse Name: N/A    Number of Children: N/A  . Years of Education: N/A   Social History Main Topics  . Smoking status: Former Smoker -- 61 years    Types: Pipe    Quit date: 08/15/2013  . Smokeless tobacco: None  . Alcohol Use: Yes  . Drug Use: No  . Sexual Activity: Not Currently   Other Topics Concern  . None   Social History Narrative  . None    Review of Systems - See HPI.  All other ROS are negative.  BP 110/68  Pulse 79  Temp(Src) 98.6 F (37 C) (Oral)  Resp 16  Ht '6\' 1"'  (1.854 m)  Wt 211 lb 8 oz (95.936 kg)  BMI 27.91 kg/m2  SpO2 97%  Physical Exam  Vitals reviewed. Constitutional: He is oriented to person, place, and time and well-developed, well-nourished, and in no  distress.  HENT:  Head: Normocephalic and atraumatic.  Eyes: Conjunctivae are normal. Pupils are equal, round, and reactive to light.  Neck: Neck supple.  Cardiovascular: Normal rate, regular rhythm, normal heart sounds and intact distal pulses.   Pulmonary/Chest: Effort normal and breath sounds normal. No respiratory distress. He has no wheezes. He has no rales. He exhibits no tenderness.  Abdominal: Soft. Bowel sounds are normal. He exhibits no distension. There is no tenderness.  Neurological: He is alert and oriented to person, place, and time.  Skin: Skin is warm and dry. No rash noted.  Psychiatric: Affect normal.    Recent Results (from the past 2160 hour(s))  CBC WITH DIFFERENTIAL     Status: None   Collection Time    01/14/14 10:46 AM      Result Value Ref Range   WBC 5.9  4.0 - 10.5 K/uL   RBC 4.62  4.22 - 5.81 MIL/uL   Hemoglobin 14.7  13.0 - 17.0 g/dL   HCT 42.8  39.0 - 52.0 %   MCV 92.6  78.0 - 100.0 fL   MCH 31.8  26.0 - 34.0 pg   MCHC 34.3  30.0 - 36.0 g/dL   RDW 14.0  11.5 - 15.5 %   Platelets 278  150 - 400 K/uL   Neutrophils Relative % 57  43 - 77 %   Neutro Abs 3.4  1.7 - 7.7 K/uL   Lymphocytes Relative 27  12 - 46 %   Lymphs Abs 1.6  0.7 - 4.0 K/uL   Monocytes Relative 11  3 - 12 %   Monocytes Absolute 0.6  0.1 - 1.0 K/uL   Eosinophils Relative 4  0 - 5 %   Eosinophils Absolute 0.2  0.0 - 0.7 K/uL   Basophils Relative 1  0 - 1 %   Basophils Absolute 0.1  0.0 - 0.1 K/uL   Smear Review Criteria for review not met    BASIC METABOLIC PANEL WITH GFR     Status: Abnormal   Collection Time    01/14/14 10:46 AM      Result Value Ref Range   Sodium 137  135 - 145 mEq/L   Potassium 4.5  3.5 - 5.3 mEq/L   Chloride 103  96 - 112 mEq/L   CO2 27  19 - 32 mEq/L   Glucose, Bld 100 (*) 70 - 99 mg/dL  BUN 16  6 - 23 mg/dL   Creat 1.05  0.50 - 1.35 mg/dL   Calcium 9.3  8.4 - 10.5 mg/dL   GFR, Est African American 86     GFR, Est Non African American 75      Comment:       The estimated GFR is a calculation valid for adults (>=15 years old)     that uses the CKD-EPI algorithm to adjust for age and sex. It is       not to be used for children, pregnant women, hospitalized patients,        patients on dialysis, or with rapidly changing kidney function.     According to the NKDEP, eGFR >89 is normal, 60-89 shows mild     impairment, 30-59 shows moderate impairment, 15-29 shows severe     impairment and <15 is ESRD.        HEPATIC FUNCTION PANEL     Status: None   Collection Time    01/14/14 10:46 AM      Result Value Ref Range   Total Bilirubin 0.6  0.2 - 1.2 mg/dL   Bilirubin, Direct 0.1  0.0 - 0.3 mg/dL   Indirect Bilirubin 0.5  0.2 - 1.2 mg/dL   Alkaline Phosphatase 58  39 - 117 U/L   AST 20  0 - 37 U/L   ALT 35  0 - 53 U/L   Total Protein 6.5  6.0 - 8.3 g/dL   Albumin 4.2  3.5 - 5.2 g/dL  TSH     Status: None   Collection Time    01/14/14 10:46 AM      Result Value Ref Range   TSH 1.821  0.350 - 4.500 uIU/mL  T4, FREE     Status: None   Collection Time    01/14/14 10:46 AM      Result Value Ref Range   Free T4 1.21  0.80 - 1.80 ng/dL  VITAMIN D 1,25 DIHYDROXY     Status: None   Collection Time    01/14/14 10:46 AM      Result Value Ref Range   Vitamin D 1, 25 (OH)2 Total 26  18 - 72 pg/mL   Vitamin D3 1, 25 (OH)2 26     Vitamin D2 1, 25 (OH)2 <8     Comment: Vitamin D3, 1,25(OH)2 indicates both endogenous     production and supplementation.  Vitamin D2, 1,25(OH)2     is an indicator of exogeous sources, such as diet or     supplementation.  Interpretation and therapy are based     on measurement of Vitamin D,1,25(OH)2, Total.     This test was developed and its performance     characteristics have been determined by [facility], [place], [place].     Performance characteristics refer to the     analytical performance of the test.  METHYLMALONIC ACID, SERUM     Status: Abnormal   Collection Time     01/14/14 10:46 AM      Result Value Ref Range   Methylmalonic Acid, Quant 0.47 (*) <0.40 umol/L  LIPID PANEL     Status: Abnormal   Collection Time    01/14/14 10:46 AM      Result Value Ref Range   Cholesterol 214 (*) 0 - 200 mg/dL   Comment: ATP III Classification:           < 200  mg/dL        Desirable          200 - 239     mg/dL        Borderline High          >= 240        mg/dL        High         Triglycerides 251 (*) <150 mg/dL   HDL 71  >39 mg/dL   Total CHOL/HDL Ratio 3.0     VLDL 50 (*) 0 - 40 mg/dL   LDL Cholesterol 93  0 - 99 mg/dL   Comment:       Total Cholesterol/HDL Ratio:CHD Risk                            Coronary Heart Disease Risk Table                                            Men       Women              1/2 Average Risk              3.4        3.3                  Average Risk              5.0        4.4               2X Average Risk              9.6        7.1               3X Average Risk             23.4       11.0     Use the calculated Patient Ratio above and the CHD Risk table      to determine the patient's CHD Risk.     ATP III Classification (LDL):           < 100        mg/dL         Optimal          100 - 129     mg/dL         Near or Above Optimal          130 - 159     mg/dL         Borderline High          160 - 189     mg/dL         High           > 190        mg/dL         Very High        URINALYSIS, ROUTINE W REFLEX MICROSCOPIC     Status: None   Collection Time    01/14/14 10:46 AM      Result Value Ref Range   Color, Urine YELLOW  YELLOW   APPearance CLEAR  CLEAR   Specific Gravity, Urine 1.017  1.005 - 1.030   pH 5.5  5.0 - 8.0   Glucose, UA NEG  NEG mg/dL   Bilirubin Urine NEG  NEG   Ketones, ur NEG  NEG mg/dL   Hgb urine dipstick NEG  NEG   Protein, ur NEG  NEG mg/dL   Urobilinogen, UA 0.2  0.0 - 1.0 mg/dL   Nitrite NEG  NEG   Leukocytes, UA NEG  NEG  HEMOGLOBIN A1C     Status: Abnormal   Collection Time     04/05/14 11:45 AM      Result Value Ref Range   Hemoglobin A1C 6.0 (*) <5.7 %   Comment:                                                                            According to the ADA Clinical Practice Recommendations for 2011, when     HbA1c is used as a screening test:             >=6.5%   Diagnostic of Diabetes Mellitus                (if abnormal result is confirmed)           5.7-6.4%   Increased risk of developing Diabetes Mellitus           References:Diagnosis and Classification of Diabetes Mellitus,Diabetes     JSHF,0263,78(HYIFO 1):S62-S69 and Standards of Medical Care in             Diabetes - 2011,Diabetes YDXA,1287,86 (Suppl 1):S11-S61.         Mean Plasma Glucose 126 (*) <117 mg/dL  URINALYSIS, ROUTINE W REFLEX MICROSCOPIC     Status: None   Collection Time    04/06/14  1:26 AM      Result Value Ref Range   Color, Urine YELLOW  YELLOW   APPearance CLEAR  CLEAR   Specific Gravity, Urine 1.015  1.005 - 1.030   pH 5.5  5.0 - 8.0   Glucose, UA NEGATIVE  NEGATIVE mg/dL   Hgb urine dipstick NEGATIVE  NEGATIVE   Bilirubin Urine NEGATIVE  NEGATIVE   Ketones, ur NEGATIVE  NEGATIVE mg/dL   Protein, ur NEGATIVE  NEGATIVE mg/dL   Urobilinogen, UA 0.2  0.0 - 1.0 mg/dL   Nitrite NEGATIVE  NEGATIVE   Leukocytes, UA NEGATIVE  NEGATIVE   Comment: MICROSCOPIC NOT DONE ON URINES WITH NEGATIVE PROTEIN, BLOOD, LEUKOCYTES, NITRITE, OR GLUCOSE <1000 mg/dL.  CBC WITH DIFFERENTIAL     Status: None   Collection Time    04/06/14  2:16 AM      Result Value Ref Range   WBC 6.8  4.0 - 10.5 K/uL   RBC 4.35  4.22 - 5.81 MIL/uL   Hemoglobin 14.4  13.0 - 17.0 g/dL   HCT 41.2  39.0 - 52.0 %   MCV 94.7  78.0 - 100.0 fL   MCH 33.1  26.0 - 34.0 pg   MCHC 35.0  30.0 - 36.0 g/dL   RDW 12.7  11.5 - 15.5 %   Platelets 285  150 - 400 K/uL   Neutrophils Relative % 64  43 - 77 %   Neutro Abs 4.3  1.7 - 7.7 K/uL   Lymphocytes Relative 19  12 - 46 %   Lymphs Abs 1.3  0.7 - 4.0 K/uL   Monocytes  Relative 11  3 - 12 %   Monocytes Absolute 0.7  0.1 - 1.0 K/uL   Eosinophils Relative 5  0 - 5 %   Eosinophils Absolute 0.3  0.0 - 0.7 K/uL   Basophils Relative 1  0 - 1 %   Basophils Absolute 0.1  0.0 - 0.1 K/uL  COMPREHENSIVE METABOLIC PANEL     Status: Abnormal   Collection Time    04/06/14  2:16 AM      Result Value Ref Range   Sodium 134 (*) 137 - 147 mEq/L   Potassium 4.2  3.7 - 5.3 mEq/L   Chloride 97  96 - 112 mEq/L   CO2 23  19 - 32 mEq/L   Glucose, Bld 138 (*) 70 - 99 mg/dL   BUN 18  6 - 23 mg/dL   Creatinine, Ser 1.30  0.50 - 1.35 mg/dL   Calcium 9.8  8.4 - 10.5 mg/dL   Total Protein 7.4  6.0 - 8.3 g/dL   Albumin 4.2  3.5 - 5.2 g/dL   AST 24  0 - 37 U/L   ALT 28  0 - 53 U/L   Alkaline Phosphatase 69  39 - 117 U/L   Total Bilirubin 0.5  0.3 - 1.2 mg/dL   GFR calc non Af Amer 56 (*) >90 mL/min   GFR calc Af Amer 65 (*) >90 mL/min   Comment: (NOTE)     The eGFR has been calculated using the CKD EPI equation.     This calculation has not been validated in all clinical situations.     eGFR's persistently <90 mL/min signify possible Chronic Kidney     Disease.  LIPASE, BLOOD     Status: None   Collection Time    04/06/14  2:16 AM      Result Value Ref Range   Lipase 45  11 - 59 U/L    Assessment/Plan: GERD (gastroesophageal reflux disease) Will stop Gabapentin as it is likely contributing to GERD exacerbation.  Continue medication regimen.  Cease alcohol consumption. Avoid trigger foods. Avoid late-night eating.  Neuropathy Gabapentin D/C due to side effect.  Refilled Norco. Encouraged smoking and alcohol cessation.  Dehydration Patient clinically stable.  BP and pulse good.  No sign of dehydration on examination.  Reiterated importance of staying hydrated and avoidance of excess heat exposure.

## 2014-04-21 ENCOUNTER — Ambulatory Visit: Payer: Self-pay | Admitting: Emergency Medicine

## 2014-04-28 ENCOUNTER — Ambulatory Visit: Payer: Self-pay | Admitting: Physician Assistant

## 2014-04-29 ENCOUNTER — Ambulatory Visit (INDEPENDENT_AMBULATORY_CARE_PROVIDER_SITE_OTHER): Payer: Medicare HMO | Admitting: Physician Assistant

## 2014-04-29 ENCOUNTER — Encounter: Payer: Self-pay | Admitting: Physician Assistant

## 2014-04-29 VITALS — BP 128/68 | HR 78 | Temp 98.3°F | Resp 16 | Ht 73.0 in | Wt 214.0 lb

## 2014-04-29 DIAGNOSIS — T63301A Toxic effect of unspecified spider venom, accidental (unintentional), initial encounter: Secondary | ICD-10-CM

## 2014-04-29 DIAGNOSIS — T63391A Toxic effect of venom of other spider, accidental (unintentional), initial encounter: Secondary | ICD-10-CM

## 2014-04-29 DIAGNOSIS — C61 Malignant neoplasm of prostate: Secondary | ICD-10-CM

## 2014-04-29 DIAGNOSIS — K5900 Constipation, unspecified: Secondary | ICD-10-CM

## 2014-04-29 DIAGNOSIS — T6391XA Toxic effect of contact with unspecified venomous animal, accidental (unintentional), initial encounter: Secondary | ICD-10-CM

## 2014-04-29 DIAGNOSIS — K219 Gastro-esophageal reflux disease without esophagitis: Secondary | ICD-10-CM

## 2014-04-29 NOTE — Patient Instructions (Addendum)
Fiber supplement and probiotic otc. Discouraged daily use of enema. Use Miralax if possible every third if can tolerate. Continue pepcid for reflux. Stressed importance of  seeing urologist in 10 days. Use otc neosporin to the rt heal area.(If this area worses or changes let us know.)         Constipation Constipation is when a person has fewer than three bowel movements a week, has difficulty having a bowel movement, or has stools that are dry, hard, or larger than normal. As people grow older, constipation is more common. If you try to fix constipation with medicines that make you have a bowel movement (laxatives), the problem may get worse. Long-term laxative use may cause the muscles of the colon to become weak. A low-fiber diet, not taking in enough fluids, and taking certain medicines may make constipation worse.  CAUSES   Certain medicines, such as antidepressants, pain medicine, iron supplements, antacids, and water pills.   Certain diseases, such as diabetes, irritable bowel syndrome (IBS), thyroid disease, or depression.   Not drinking enough water.   Not eating enough fiber-rich foods.   Stress or travel.   Lack of physical activity or exercise.   Ignoring the urge to have a bowel movement.   Using laxatives too much.  SIGNS AND SYMPTOMS   Having fewer than three bowel movements a week.   Straining to have a bowel movement.   Having stools that are hard, dry, or larger than normal.   Feeling full or bloated.   Pain in the lower abdomen.   Not feeling relief after having a bowel movement.  DIAGNOSIS  Your health care provider will take a medical history and perform a physical exam. Further testing may be done for severe constipation. Some tests may include:  A barium enema X-ray to examine your rectum, colon, and, sometimes, your small intestine.   A sigmoidoscopy to examine your lower colon.   A colonoscopy to examine your entire  colon. TREATMENT  Treatment will depend on the severity of your constipation and what is causing it. Some dietary treatments include drinking more fluids and eating more fiber-rich foods. Lifestyle treatments may include regular exercise. If these diet and lifestyle recommendations do not help, your health care provider may recommend taking over-the-counter laxative medicines to help you have bowel movements. Prescription medicines may be prescribed if over-the-counter medicines do not work.  HOME CARE INSTRUCTIONS   Eat foods that have a lot of fiber, such as fruits, vegetables, whole grains, and beans.  Limit foods high in fat and processed sugars, such as french fries, hamburgers, cookies, candies, and soda.   A fiber supplement may be added to your diet if you cannot get enough fiber from foods.   Drink enough fluids to keep your urine clear or pale yellow.   Exercise regularly or as directed by your health care provider.   Go to the restroom when you have the urge to go. Do not hold it.   Only take over-the-counter or prescription medicines as directed by your health care provider. Do not take other medicines for constipation without talking to your health care provider first.  [place] IF:   You have bright red blood in your stool.   Your constipation lasts for more than 4 days or gets worse.   You have abdominal or rectal pain.   You have thin, pencil-like stools.   You have unexplained weight loss. MAKE SURE YOU:   Understand these instructions.  Will watch your condition.  Will get help right away if you are not doing well or get worse. Document Released: 06/29/2004 Document Revised: 10/06/2013 Document Reviewed: 07/13/2013 [facility] Patient Information 2015 [place], [place]. This information is not intended to replace advice given to you by your health care provider. Make sure you discuss any questions you have with your health care  provider.

## 2014-04-29 NOTE — Assessment & Plan Note (Signed)
Controlled continue current pepcid.

## 2014-04-29 NOTE — Assessment & Plan Note (Signed)
Stressed importance of him going to urologist. He has appointment in 10 days. He agreed he would go.

## 2014-04-29 NOTE — Progress Notes (Signed)
Subjective:    Patient ID: [name], male    DOB: 1951-05-06, 63 y.o.   MRN: [id number]  HPI  Pt states he has history of constipation recently over past 2 wks. Other times in his life would go daily. 2 wks ago he was using enema every other day. Then last week he used enema every day. Only laproscopic cholocystectomy. Pt also has prostate CA. Dr. Amalia Hailey last saw him year and half ago. Yesterday after enema he decribed very large bowel movement. He describes as extreme large bm. Then saw some bright red blood in toilet water. Later in day had loose stool and he stated felt great. Some heart burn as well. Last colonoscopy was  Yrs ago per pt. Told to follow up in 10 yrs.(Per pt).  Pt states a lot medicaton make worse when he tries to clear constipation. Otc meds other than enema will cause stomach cramping.  Pt also thinks he got bit by spider. 10 days ago he saw small spider climb in his boot and felt mild pain and itching. Then he took off boot and saw raised area medial aspect of right heel.    Review of Systems  Constitutional: Negative for chills, diaphoresis and fatigue.  HENT: Negative.   Respiratory: Negative for chest tightness and shortness of breath.   Cardiovascular: Negative for chest pain and palpitations.  Gastrointestinal: Positive for diarrhea, constipation and anal bleeding. Negative for nausea, vomiting, abdominal pain and rectal pain.       Constipation up until yesterday after large bowel movement. Then later in the day he had loose stools and then felt better.   Genitourinary: Negative.        None reported.  Skin:       Small raised area medial aspect rt heal.  Hematological: Does not bruise/bleed easily.       Objective:   Physical Exam  .General Appearance- Not in acute distress.  HEENT Eyes- Scleraeral/Conjuntiva-bilat- Not Yellow. Mouth & Throat- Normal.  Chest and Lung Exam Auscultation: Breath sounds:-Normal. Adventitious sounds:- No  Adventitious sounds.  Lungs Clear even and unlabored.  Cardiovascular Auscultation:Rythm - Regular. Heart Sounds -Normal heart sounds.  Abdomen Inspection:-Inspection Normal.  Palpation/Perucssion: Palpation and Percussion of the abdomen reveal- Non Tender, No Rebound tenderness, No rigidity(Guarding) and No Palpable abdominal masses.  Liver:-Normal.  Spleen:- Normal.   Rectal Anorectal Exam: Stool - Hemoccult of stool/mucous is Heme Negative. External - normal external exam(No rectal tear or fissure seen). Internal - normal sphincter tone. No rectal mass.  RT heal Small raised area medial rt heal about 4-5 mm width raised slightly. No redness, no fluctuance, non tender.       Assessment & Plan:  Unspecified constipation Fiber otc supplement, probiotic. Minimize enema to one q week if he uses. Miralax every 3 rd day if needed. If persisting constipation let us know. Note he reports colonosocopy normal 5 yrs ago. Told to repeat in 10 yrs.  GERD (gastroesophageal reflux disease) Controlled continue current pepcid.  Prostate cancer Stressed importance of him going to urologist. He has appointment in 10 days. He agreed he would go.  Spider bite Small raised area to rt medial heal. No obvious redness or swelling. No ulceration. Use neosporin otc. If area worsens, persist or changes notify us.   The note above belongs to [employer], PA-C who is finishing his on-site EPIC training with me.  I have personally seen and evaluated the patient and am in agreement with the aforementioned  plan.  Brunetta Jeans, PA-C.

## 2014-04-29 NOTE — Progress Notes (Signed)
Pre visit review using our clinic review tool, if applicable. No additional management support is needed unless otherwise documented below in the visit note/SLS  

## 2014-04-29 NOTE — Assessment & Plan Note (Signed)
Fiber otc supplement, probiotic. Minimize enema to one q week if he uses. Miralax every 3 rd day if needed. If persisting constipation let us know. Note he reports colonosocopy normal 5 yrs ago. Told to repeat in 10 yrs.

## 2014-04-29 NOTE — Assessment & Plan Note (Signed)
Small raised area to rt medial heal. No obvious redness or swelling. No ulceration. Use neosporin otc. If area worsens, persist or changes notify us.

## 2014-04-30 ENCOUNTER — Telehealth: Payer: Self-pay | Admitting: Physician Assistant

## 2014-04-30 NOTE — Telephone Encounter (Signed)
Patient Information:  Caller Name: [name]  Phone: [phone]  Patient: Jonathon, Luna  Gender: Male  DOB: [birth year]  Age: 63 Years  PCP: Raiford Noble "Einar Pheasant"  Office Follow Up:  Does the office need to follow up with this patient?: No  Instructions For The Office: N/A  RN Note:  Called office and spoke to Dr Rudy Jew advised to go to ED.  Symptoms  Reason For Call & Symptoms: Calling because ate lots of mints yesterday,felt sleepy,ran into bush at home,laid down and fell asleep,had slurred speech. Today still feels bad this am and ate 5 candy bars,cereal with sugar and soda--now mouth is very dry,very thirsty,skin is crawling,groggy. Drinking lots of water. Hx of borderline diabetes. Was seen at office yesterday for constipation.  Reviewed Health History In EMR: No  Reviewed Medications In EMR: No  Reviewed Allergies In EMR: No  Reviewed Surgeries / Procedures: No  Date of Onset of Symptoms: 04/29/2014  Guideline(s) Used:  Diabetes - High Blood Sugar  Disposition Per Guideline:   Go to ED Now (or to Office with PCP Approval)  Reason For Disposition Reached:   Patient sounds very sick or weak to the triager  Advice Given:  N/A  Patient Will Follow Care Advice:  YES

## 2014-05-12 ENCOUNTER — Other Ambulatory Visit: Payer: Self-pay | Admitting: Physician Assistant

## 2014-05-12 NOTE — Telephone Encounter (Signed)
Patient called requesting refill for Breo Inhaler stating he refused to see Pulmonologist referred to again d/t provider walking in exam room and telling pt "you have lung cancer" [this was a error placed in system note on pulmonary end] per PCP, as he states this was discussed with patient at last OV. Rx request to pharmacy/SLS Provider: Collene Gobble, MD Department: [facility] "The referral paperwork says something about referral for lung cancer?? But the pt does not know anything about this. "

## 2014-05-14 ENCOUNTER — Telehealth: Payer: Self-pay | Admitting: Physician Assistant

## 2014-05-14 NOTE — Telephone Encounter (Signed)
Spoke with patient concerning financial assistance through [place].  Will need to call foundation to see what requirements they have of [name] as prescribers.  Will call patient back once I have spoken to the organization.

## 2014-05-15 ENCOUNTER — Encounter (HOSPITAL_BASED_OUTPATIENT_CLINIC_OR_DEPARTMENT_OTHER): Payer: Self-pay | Admitting: Emergency Medicine

## 2014-05-15 ENCOUNTER — Emergency Department (HOSPITAL_BASED_OUTPATIENT_CLINIC_OR_DEPARTMENT_OTHER): Payer: Medicare HMO

## 2014-05-15 ENCOUNTER — Emergency Department (HOSPITAL_BASED_OUTPATIENT_CLINIC_OR_DEPARTMENT_OTHER)
Admission: EM | Admit: 2014-05-15 | Discharge: 2014-05-15 | Disposition: A | Discharge status: Home / Self Care | Payer: Medicare HMO | Attending: Emergency Medicine | Admitting: Emergency Medicine

## 2014-05-15 DIAGNOSIS — Z7982 Long term (current) use of aspirin: Secondary | ICD-10-CM | POA: Diagnosis not present

## 2014-05-15 DIAGNOSIS — F1021 Alcohol dependence, in remission: Secondary | ICD-10-CM | POA: Diagnosis not present

## 2014-05-15 DIAGNOSIS — I10 Essential (primary) hypertension: Secondary | ICD-10-CM | POA: Diagnosis not present

## 2014-05-15 DIAGNOSIS — Z77098 Contact with and (suspected) exposure to other hazardous, chiefly nonmedicinal, chemicals: Secondary | ICD-10-CM

## 2014-05-15 DIAGNOSIS — Z87891 Personal history of nicotine dependence: Secondary | ICD-10-CM | POA: Insufficient documentation

## 2014-05-15 DIAGNOSIS — Z8546 Personal history of malignant neoplasm of prostate: Secondary | ICD-10-CM | POA: Insufficient documentation

## 2014-05-15 DIAGNOSIS — Z8619 Personal history of other infectious and parasitic diseases: Secondary | ICD-10-CM | POA: Insufficient documentation

## 2014-05-15 DIAGNOSIS — Z8612 Personal history of poliomyelitis: Secondary | ICD-10-CM | POA: Insufficient documentation

## 2014-05-15 DIAGNOSIS — Z8611 Personal history of tuberculosis: Secondary | ICD-10-CM | POA: Diagnosis not present

## 2014-05-15 DIAGNOSIS — Z880 Allergy status to penicillin: Secondary | ICD-10-CM | POA: Diagnosis not present

## 2014-05-15 DIAGNOSIS — Z8719 Personal history of other diseases of the digestive system: Secondary | ICD-10-CM | POA: Insufficient documentation

## 2014-05-15 DIAGNOSIS — R0602 Shortness of breath: Secondary | ICD-10-CM | POA: Diagnosis present

## 2014-05-15 DIAGNOSIS — J441 Chronic obstructive pulmonary disease with (acute) exacerbation: Secondary | ICD-10-CM | POA: Insufficient documentation

## 2014-05-15 LAB — BASIC METABOLIC PANEL
Anion gap: 13 (ref 5–15)
BUN: 13 mg/dL (ref 6–23)
CO2: 26 mEq/L (ref 19–32)
Calcium: 9.7 mg/dL (ref 8.4–10.5)
Chloride: 100 mEq/L (ref 96–112)
Creatinine, Ser: 1.2 mg/dL (ref 0.50–1.35)
GFR calc Af Amer: 72 mL/min — ABNORMAL LOW (ref >90)
GFR, EST NON AFRICAN AMERICAN: 62 mL/min — AB (ref >90)
Glucose, Bld: 123 mg/dL — ABNORMAL HIGH (ref 70–99)
POTASSIUM: 4.6 meq/L (ref 3.7–5.3)
Sodium: 139 mEq/L (ref 137–147)

## 2014-05-15 LAB — CBC WITH DIFFERENTIAL/PLATELET
BASOS ABS: 0 10*3/uL (ref 0.0–0.1)
BASOS PCT: 1 % (ref 0–1)
EOS ABS: 0.3 10*3/uL (ref 0.0–0.7)
Eosinophils Relative: 4 % (ref 0–5)
HCT: 41.7 % (ref 39.0–52.0)
Hemoglobin: 14.3 g/dL (ref 13.0–17.0)
Lymphocytes Relative: 23 % (ref 12–46)
Lymphs Abs: 1.4 10*3/uL (ref 0.7–4.0)
MCH: 32.2 pg (ref 26.0–34.0)
MCHC: 34.3 g/dL (ref 30.0–36.0)
MCV: 93.9 fL (ref 78.0–100.0)
Monocytes Absolute: 0.5 10*3/uL (ref 0.1–1.0)
Monocytes Relative: 8 % (ref 3–12)
NEUTROS ABS: 3.9 10*3/uL (ref 1.7–7.7)
NEUTROS PCT: 64 % (ref 43–77)
PLATELETS: 236 10*3/uL (ref 150–400)
RBC: 4.44 MIL/uL (ref 4.22–5.81)
RDW: 12.4 % (ref 11.5–15.5)
WBC: 6.1 10*3/uL (ref 4.0–10.5)

## 2014-05-15 LAB — TROPONIN I

## 2014-05-15 MED ORDER — ONDANSETRON 8 MG PO TBDP
8.0000 mg | ORAL_TABLET | Freq: Once | ORAL | Status: AC
  Administered 2014-05-15: 8 mg via ORAL
  Filled 2014-05-15: qty 1

## 2014-05-15 MED ORDER — ONDANSETRON HCL 4 MG PO TABS: 4.0000 mg | ORAL_TABLET | Freq: Four times a day (QID) | ORAL | Status: DC

## 2014-05-15 NOTE — Discharge Instructions (Signed)
YOUR SYMPTOMS TODAY ARE LIKELY A MILD REACTION TO EXPOSURE TO THE CHEMICALS USED EARLIER TODAY TO KILL HORNETS.  CONTINUE YOUR NEBULIZER USE EVERY 4-6 HOURS AS NEEDED. TAKE ZOFRAN AS NEEDED FOR NAUSEA. RETURN HERE WITH ANY WORSENING SYMPTOMS OR NEW CONCERNS.

## 2014-05-15 NOTE — ED Notes (Signed)
Patient here with reporting that he became short of breath after using wasp spray and some blowin back in his face, has COPD and used nebulizer with some relief, lungs clear and in no distress on arrival

## 2014-05-17 ENCOUNTER — Telehealth: Payer: Self-pay | Admitting: Physician Assistant

## 2014-05-17 MED ORDER — ALBUTEROL SULFATE HFA 108 (90 BASE) MCG/ACT IN AERS: 1.0000 | INHALATION_SPRAY | Freq: Four times a day (QID) | RESPIRATORY_TRACT | Status: DC | PRN

## 2014-05-17 NOTE — Telephone Encounter (Signed)
Verified pharmacy with patient; called pharmacy and gave information to pharmacist, Cristie Hem, needed for patient assistance for COPD/Asthma medications, states he will place information in patient's file/SLS

## 2014-05-17 NOTE — Telephone Encounter (Signed)
Spoke with representative at the Patient [employer] concerning steps to take to ensure patient is reimbursed for medications.  Certain items must be given to the pharmacy where the medication is dispensed.  Can we please verify with the patient that he wishes to use the [facility] [place] in [place], [place]?    Once we have confirmed this, we need to contact the pharmacy with the patient's name, DOB, Rx BIN number -- [id number], and Rx Group Number -- [id number].   This will ensure that his asthma/COPD medications are free of charge up to a limit of 3,000.00 per year.

## 2014-05-21 NOTE — ED Provider Notes (Signed)
CSN: [id number]     Arrival date & time 05/15/14  1221 History   First MD Initiated Contact with Patient 05/15/14 1300     Chief Complaint  Patient presents with  . Shortness of Breath     (Consider location/radiation/quality/duration/timing/severity/associated sxs/prior Treatment) Patient is a 63 y.o. male presenting with shortness of breath. The history is provided by the patient. No language interpreter was used.  Shortness of Breath Severity:  Mild Associated symptoms: no chest pain, no cough, no fever and no vomiting   Associated symptoms comment:  He was sitting on the porch where his wife sprayed wasp spray that blew back into his face. He subsequently felt SOB, with mild cough. No fever. No chest pain. He has a history of COPD and used his nebulizer after onset of symptoms. He states that he felt some better temporarily. Symptoms have persisted and remain mild.    Past Medical History  Diagnosis Date  . COPD (chronic obstructive pulmonary disease)   . Hypertension   . Polio   . Bronchitis   . DJD (degenerative joint disease) of hip   . Angina pectoris   . Tuberculosis   . Prostate cancer   . Chicken pox   . Rheumatic fever   . Measles   . Mumps   . GERD (gastroesophageal reflux disease)   . Alcoholism    Past Surgical History  Procedure Laterality Date  . Rotator cuff repair    . Other surgical history       Muscle & bone Graft/Polio  . Cholecystectomy    . Nose surgery    . Nasal septum surgery    . Kidney stone surgery    . Hand surgery    . Shoulder surgery      Arthritis & Bone Spurs  . Dental surgery     Family History  Problem Relation Age of Onset  . Alzheimer's disease Father 4    Deceased  . Stomach cancer Father   . Heart attack Father   . Heart disease Father   . Skin cancer Mother     Facial-Living  . Alcohol abuse Sister     x2  . Mental illness Sister     x2  . Diabetes Maternal Aunt     x2  . Thyroid disease Maternal Aunt     x4   . Diabetes Maternal Uncle   . Tuberculosis Paternal Grandfather   . Tuberculosis Paternal Grandmother   . Alzheimer's disease Paternal Aunt   . Alzheimer's disease Paternal Uncle    History  Substance Use Topics  . Smoking status: Former Smoker -- 30 years    Types: Pipe    Quit date: 08/15/2013  . Smokeless tobacco: Not on file  . Alcohol Use: Yes    Review of Systems  Constitutional: Negative for fever.  HENT: Negative for congestion.   Respiratory: Positive for shortness of breath. Negative for cough.   Cardiovascular: Negative for chest pain.  Gastrointestinal: Negative for vomiting.  Musculoskeletal: Negative for myalgias.      Allergies  Ivp dye; Penicillins; Aspirin; Morphine and related; and Nsaids  Home Medications   Prior to Admission medications   Medication Sig Start Date End Date Taking? Authorizing Provider  albuterol (PROVENTIL HFA;VENTOLIN HFA) 108 (90 BASE) MCG/ACT inhaler Inhale 1-2 puffs into the lungs every 6 (six) hours as needed for wheezing or shortness of breath. PROAIR 05/17/14   Brunetta Jeans, PA-C  albuterol (PROVENTIL) (2.5 MG/3ML) 0.083% nebulizer solution  Take 2.5 mg by nebulization every 6 (six) hours as needed for wheezing or shortness of breath.    Historical Provider, MD  ALPRAZolam [name]) 0.5 MG tablet Take 1 tablet (0.5 mg total) by mouth 2 (two) times daily as needed for anxiety. 04/09/14   Brunetta Jeans, PA-C  amLODipine (NORVASC) 10 MG tablet TAKE 1 TABLET (10 MG TOTAL) BY MOUTH DAILY. 04/02/14   Brunetta Jeans, PA-C  BREO ELLIPTA 100-25 MCG/INH AEPB INHALE 1 PUFF DAILY AND RINSE MOUTH AFTER USE. 05/12/14   Brunetta Jeans, PA-C  EPINEPHrine (EPIPEN) 0.3 mg/0.3 mL IJ SOAJ injection Inject 0.3 mLs (0.3 mg total) into the muscle once. 04/05/14   Brunetta Jeans, PA-C  famotidine (PEPCID) 20 MG tablet Take 1 tablet (20 mg total) by mouth 2 (two) times daily. 03/10/14   Brunetta Jeans, PA-C  gabapentin (NEURONTIN) 300 MG capsule One  tab PO daily for a week, then twice daily for a week, then three times daily. 04/05/14   Brunetta Jeans, PA-C  HYDROcodone-acetaminophen (NORCO) 10-325 MG per tablet Take 1 tablet by mouth every 8 (eight) hours as needed. 04/09/14   Brunetta Jeans, PA-C  loratadine (CLARITIN) 10 MG tablet TAKE 1 TABLET BY MOUTH EVERY DAY 01/25/14   Brunetta Jeans, PA-C  losartan (COZAAR) 50 MG tablet TAKE 1 TABLET (50 MG TOTAL) BY MOUTH DAILY. 04/02/14   Brunetta Jeans, PA-C  montelukast (SINGULAIR) 10 MG tablet Take 10 mg by mouth at bedtime.    Historical Provider, MD  nitroGLYCERIN (NITROSTAT) 0.4 MG SL tablet Place 0.4 mg under the tongue every 5 (five) minutes as needed. For chest pain    Historical Provider, MD  ondansetron (ZOFRAN) 4 MG tablet Take 1 tablet (4 mg total) by mouth every 6 (six) hours. 05/15/14   Murray Durrell A Ayana Imhof, PA-C   BP 119/74  Pulse 56  Temp(Src) 98 F (36.7 C) (Oral)  Resp 18  SpO2 99% Physical Exam  Constitutional: He is oriented to person, place, and time. He appears well-developed and well-nourished.  HENT:  Head: Normocephalic.  Neck: Normal range of motion. Neck supple.  Cardiovascular: Normal rate and regular rhythm.   Pulmonary/Chest: Effort normal. He has rales.  Abdominal: Soft. Bowel sounds are normal. There is no tenderness. There is no rebound and no guarding.  Musculoskeletal: Normal range of motion.  Neurological: He is alert and oriented to person, place, and time.  Skin: Skin is warm and dry. No rash noted.  Psychiatric: He has a normal mood and affect.    ED Course  Procedures (including critical care time) Labs Review Labs Reviewed  BASIC METABOLIC PANEL - Abnormal; Notable for the following:    Glucose, Bld 123 (*)    GFR calc non Af Amer 62 (*)    GFR calc Af Amer 72 (*)    All other components within normal limits  CBC WITH DIFFERENTIAL  TROPONIN I    Imaging Review No results found.   EKG Interpretation   Date/Time:  Saturday May 15 2014 13:57:27 EDT Ventricular Rate:  55 PR Interval:  180 QRS Duration: 100 QT Interval:  434 QTC Calculation: 415 R Axis:   36 Text Interpretation:  Sinus bradycardia with marked sinus arrhythmia  Incomplete right bundle branch block Possible Anterior infarct , age  undetermined Abnormal ECG since last tracing no significant change  Confirmed by BELFI  MD, MELANIE ([id number]) on 05/15/2014 4:06:59 PM      MDM  Final diagnoses:  Chemical exposure    O2 saturation WNL and stable. He appears well without respiratory compromise. Labs, CXR, EKG without abnormality that is concerning. Feel he is stable for discharge home having had a mild reaction to bug spray.     Dewaine Oats, PA-C 05/21/14 2234

## 2014-05-22 NOTE — ED Provider Notes (Signed)
Medical screening examination/treatment/procedure(s) were performed by non-physician practitioner and as supervising physician I was immediately available for consultation/collaboration.   EKG Interpretation   Date/Time:  Saturday May 15 2014 13:57:27 EDT Ventricular Rate:  55 PR Interval:  180 QRS Duration: 100 QT Interval:  434 QTC Calculation: 415 R Axis:   36 Text Interpretation:  Sinus bradycardia with marked sinus arrhythmia  Incomplete right bundle branch block Possible Anterior infarct , age  undetermined Abnormal ECG since last tracing no significant change  Confirmed by BELFI  MD, MELANIE ([id number]) on 05/15/2014 4:06:59 PM        Ephraim Hamburger, MD 05/22/14 2322

## 2014-05-25 ENCOUNTER — Emergency Department (HOSPITAL_BASED_OUTPATIENT_CLINIC_OR_DEPARTMENT_OTHER)
Admission: EM | Admit: 2014-05-25 | Discharge: 2014-05-26 | Disposition: A | Discharge status: Home / Self Care | Payer: Medicare HMO | Attending: Emergency Medicine | Admitting: Emergency Medicine

## 2014-05-25 ENCOUNTER — Encounter (HOSPITAL_BASED_OUTPATIENT_CLINIC_OR_DEPARTMENT_OTHER): Payer: Self-pay | Admitting: Emergency Medicine

## 2014-05-25 DIAGNOSIS — Z8739 Personal history of other diseases of the musculoskeletal system and connective tissue: Secondary | ICD-10-CM | POA: Diagnosis not present

## 2014-05-25 DIAGNOSIS — Z8719 Personal history of other diseases of the digestive system: Secondary | ICD-10-CM | POA: Insufficient documentation

## 2014-05-25 DIAGNOSIS — X58XXXA Exposure to other specified factors, initial encounter: Secondary | ICD-10-CM | POA: Diagnosis not present

## 2014-05-25 DIAGNOSIS — S3981XA Other specified injuries of abdomen, initial encounter: Secondary | ICD-10-CM | POA: Diagnosis present

## 2014-05-25 DIAGNOSIS — Z8546 Personal history of malignant neoplasm of prostate: Secondary | ICD-10-CM | POA: Diagnosis not present

## 2014-05-25 DIAGNOSIS — Z8619 Personal history of other infectious and parasitic diseases: Secondary | ICD-10-CM | POA: Insufficient documentation

## 2014-05-25 DIAGNOSIS — Y939 Activity, unspecified: Secondary | ICD-10-CM | POA: Insufficient documentation

## 2014-05-25 DIAGNOSIS — IMO0002 Reserved for concepts with insufficient information to code with codable children: Secondary | ICD-10-CM | POA: Insufficient documentation

## 2014-05-25 DIAGNOSIS — Z8612 Personal history of poliomyelitis: Secondary | ICD-10-CM | POA: Diagnosis not present

## 2014-05-25 DIAGNOSIS — Z8611 Personal history of tuberculosis: Secondary | ICD-10-CM | POA: Diagnosis not present

## 2014-05-25 DIAGNOSIS — Z87891 Personal history of nicotine dependence: Secondary | ICD-10-CM | POA: Diagnosis not present

## 2014-05-25 DIAGNOSIS — T148XXA Other injury of unspecified body region, initial encounter: Secondary | ICD-10-CM

## 2014-05-25 DIAGNOSIS — Z880 Allergy status to penicillin: Secondary | ICD-10-CM | POA: Insufficient documentation

## 2014-05-25 DIAGNOSIS — F1021 Alcohol dependence, in remission: Secondary | ICD-10-CM | POA: Diagnosis not present

## 2014-05-25 DIAGNOSIS — I10 Essential (primary) hypertension: Secondary | ICD-10-CM | POA: Diagnosis not present

## 2014-05-25 DIAGNOSIS — Y929 Unspecified place or not applicable: Secondary | ICD-10-CM | POA: Diagnosis not present

## 2014-05-25 DIAGNOSIS — Z79899 Other long term (current) drug therapy: Secondary | ICD-10-CM | POA: Insufficient documentation

## 2014-05-25 DIAGNOSIS — J449 Chronic obstructive pulmonary disease, unspecified: Secondary | ICD-10-CM | POA: Insufficient documentation

## 2014-05-25 DIAGNOSIS — K5900 Constipation, unspecified: Secondary | ICD-10-CM | POA: Insufficient documentation

## 2014-05-25 DIAGNOSIS — J4489 Other specified chronic obstructive pulmonary disease: Secondary | ICD-10-CM | POA: Insufficient documentation

## 2014-05-25 DIAGNOSIS — R339 Retention of urine, unspecified: Secondary | ICD-10-CM | POA: Diagnosis not present

## 2014-05-25 LAB — URINALYSIS, ROUTINE W REFLEX MICROSCOPIC
Bilirubin Urine: NEGATIVE
GLUCOSE, UA: NEGATIVE mg/dL
Hgb urine dipstick: NEGATIVE
KETONES UR: NEGATIVE mg/dL
Leukocytes, UA: NEGATIVE
Nitrite: NEGATIVE
Protein, ur: NEGATIVE mg/dL
Specific Gravity, Urine: 1.007 (ref 1.005–1.030)
Urobilinogen, UA: 0.2 mg/dL (ref 0.0–1.0)
pH: 5.5 (ref 5.0–8.0)

## 2014-05-25 NOTE — ED Notes (Signed)
Right flank pain x 3 days.

## 2014-05-26 ENCOUNTER — Encounter: Payer: Self-pay | Admitting: Physician Assistant

## 2014-05-26 ENCOUNTER — Encounter (HOSPITAL_BASED_OUTPATIENT_CLINIC_OR_DEPARTMENT_OTHER): Payer: Self-pay | Admitting: Emergency Medicine

## 2014-05-26 ENCOUNTER — Emergency Department (HOSPITAL_BASED_OUTPATIENT_CLINIC_OR_DEPARTMENT_OTHER)
Admission: EM | Admit: 2014-05-26 | Discharge: 2014-05-26 | Disposition: A | Discharge status: Home / Self Care | Payer: Medicare HMO | Attending: Emergency Medicine | Admitting: Emergency Medicine

## 2014-05-26 ENCOUNTER — Emergency Department (HOSPITAL_BASED_OUTPATIENT_CLINIC_OR_DEPARTMENT_OTHER): Payer: Medicare HMO

## 2014-05-26 ENCOUNTER — Telehealth: Payer: Self-pay | Admitting: Physician Assistant

## 2014-05-26 ENCOUNTER — Ambulatory Visit (INDEPENDENT_AMBULATORY_CARE_PROVIDER_SITE_OTHER): Payer: Medicare HMO | Admitting: Physician Assistant

## 2014-05-26 VITALS — BP 154/78 | HR 46 | Temp 97.7°F | Resp 20 | Ht 73.0 in | Wt 212.0 lb

## 2014-05-26 DIAGNOSIS — R079 Chest pain, unspecified: Secondary | ICD-10-CM

## 2014-05-26 DIAGNOSIS — Z8739 Personal history of other diseases of the musculoskeletal system and connective tissue: Secondary | ICD-10-CM | POA: Diagnosis not present

## 2014-05-26 DIAGNOSIS — R339 Retention of urine, unspecified: Secondary | ICD-10-CM | POA: Diagnosis not present

## 2014-05-26 DIAGNOSIS — Z8719 Personal history of other diseases of the digestive system: Secondary | ICD-10-CM | POA: Diagnosis not present

## 2014-05-26 DIAGNOSIS — Z8546 Personal history of malignant neoplasm of prostate: Secondary | ICD-10-CM | POA: Diagnosis not present

## 2014-05-26 DIAGNOSIS — Z8619 Personal history of other infectious and parasitic diseases: Secondary | ICD-10-CM | POA: Diagnosis not present

## 2014-05-26 DIAGNOSIS — I10 Essential (primary) hypertension: Secondary | ICD-10-CM | POA: Diagnosis not present

## 2014-05-26 DIAGNOSIS — Z8611 Personal history of tuberculosis: Secondary | ICD-10-CM | POA: Insufficient documentation

## 2014-05-26 DIAGNOSIS — Z87891 Personal history of nicotine dependence: Secondary | ICD-10-CM | POA: Insufficient documentation

## 2014-05-26 DIAGNOSIS — Z880 Allergy status to penicillin: Secondary | ICD-10-CM | POA: Insufficient documentation

## 2014-05-26 DIAGNOSIS — I209 Angina pectoris, unspecified: Secondary | ICD-10-CM | POA: Diagnosis not present

## 2014-05-26 DIAGNOSIS — I491 Atrial premature depolarization: Secondary | ICD-10-CM | POA: Diagnosis not present

## 2014-05-26 DIAGNOSIS — J441 Chronic obstructive pulmonary disease with (acute) exacerbation: Secondary | ICD-10-CM | POA: Diagnosis not present

## 2014-05-26 DIAGNOSIS — R0602 Shortness of breath: Secondary | ICD-10-CM | POA: Diagnosis present

## 2014-05-26 DIAGNOSIS — Z79899 Other long term (current) drug therapy: Secondary | ICD-10-CM | POA: Insufficient documentation

## 2014-05-26 DIAGNOSIS — R071 Chest pain on breathing: Secondary | ICD-10-CM

## 2014-05-26 LAB — COMPREHENSIVE METABOLIC PANEL
ALBUMIN: 4.4 g/dL (ref 3.5–5.2)
ALT: 35 U/L (ref 0–53)
AST: 31 U/L (ref 0–37)
Alkaline Phosphatase: 68 U/L (ref 39–117)
Anion gap: 14 (ref 5–15)
BUN: 15 mg/dL (ref 6–23)
CALCIUM: 10.1 mg/dL (ref 8.4–10.5)
CO2: 24 mEq/L (ref 19–32)
Chloride: 101 mEq/L (ref 96–112)
Creatinine, Ser: 1.2 mg/dL (ref 0.50–1.35)
GFR calc non Af Amer: 62 mL/min — ABNORMAL LOW (ref >90)
GFR, EST AFRICAN AMERICAN: 72 mL/min — AB (ref >90)
Glucose, Bld: 108 mg/dL — ABNORMAL HIGH (ref 70–99)
Potassium: 4.3 mEq/L (ref 3.7–5.3)
SODIUM: 139 meq/L (ref 137–147)
TOTAL PROTEIN: 7.9 g/dL (ref 6.0–8.3)
Total Bilirubin: 0.6 mg/dL (ref 0.3–1.2)

## 2014-05-26 LAB — CBC WITH DIFFERENTIAL/PLATELET
BASOS ABS: 0.1 10*3/uL (ref 0.0–0.1)
BASOS PCT: 1 % (ref 0–1)
EOS ABS: 0.5 10*3/uL (ref 0.0–0.7)
EOS PCT: 7 % — AB (ref 0–5)
HCT: 44.8 % (ref 39.0–52.0)
Hemoglobin: 15.3 g/dL (ref 13.0–17.0)
Lymphocytes Relative: 29 % (ref 12–46)
Lymphs Abs: 2 10*3/uL (ref 0.7–4.0)
MCH: 32.3 pg (ref 26.0–34.0)
MCHC: 34.2 g/dL (ref 30.0–36.0)
MCV: 94.5 fL (ref 78.0–100.0)
Monocytes Absolute: 0.7 10*3/uL (ref 0.1–1.0)
Monocytes Relative: 10 % (ref 3–12)
Neutro Abs: 3.5 10*3/uL (ref 1.7–7.7)
Neutrophils Relative %: 53 % (ref 43–77)
PLATELETS: 274 10*3/uL (ref 150–400)
RBC: 4.74 MIL/uL (ref 4.22–5.81)
RDW: 12.7 % (ref 11.5–15.5)
WBC: 6.7 10*3/uL (ref 4.0–10.5)

## 2014-05-26 LAB — TROPONIN I: Troponin I: <0.3 ng/mL (ref <0.30)

## 2014-05-26 MED ORDER — METHOCARBAMOL 500 MG PO TABS
ORAL_TABLET | ORAL | Status: AC
  Administered 2014-05-26: 1000 mg via ORAL
  Filled 2014-05-26: qty 2

## 2014-05-26 MED ORDER — TAMSULOSIN HCL 0.4 MG PO CAPS: 0.4000 mg | ORAL_CAPSULE | Freq: Every day | ORAL | Status: DC

## 2014-05-26 MED ORDER — MORPHINE SULFATE 4 MG/ML IJ SOLN
4.0000 mg | Freq: Once | INTRAMUSCULAR | Status: AC
  Administered 2014-05-26: 4 mg via INTRAVENOUS
  Filled 2014-05-26: qty 1

## 2014-05-26 MED ORDER — POLYETHYLENE GLYCOL 3350 17 GM/SCOOP PO POWD: 17.0000 g | Freq: Every day | ORAL | Status: DC

## 2014-05-26 MED ORDER — SODIUM CHLORIDE 0.9 % IV BOLUS (SEPSIS)
1000.0000 mL | Freq: Once | INTRAVENOUS | Status: AC
  Administered 2014-05-26: 1000 mL via INTRAVENOUS

## 2014-05-26 MED ORDER — FENTANYL CITRATE 0.05 MG/ML IJ SOLN
INTRAMUSCULAR | Status: AC
  Administered 2014-05-26: 50 ug via INTRAMUSCULAR
  Filled 2014-05-26: qty 2

## 2014-05-26 NOTE — Telephone Encounter (Signed)
Patient is doing better on bladder issues with Catheter placed today, no change on Abdominal pain and/or constipation. Informed pt that Urgent referral placed for Cardiology by provider/SLS

## 2014-05-26 NOTE — ED Notes (Signed)
Patient states he was seen at his PCP office this morning for followup on the pain in his rlq and constipation.  Was sent her today for a new onset of afib.  Patient states he was seen here last night for dizziness, sob, pain in his RLQ.  States he was seen at [facility] three nights ago and was monitored over night for sob, elevated bp and dizziness.  All tests were negative and was to followup with his Cardiologist, Dr. Denman George in [place] in one week.

## 2014-05-26 NOTE — Discharge Instructions (Signed)
Acute Urinary Retention °Acute urinary retention is the temporary inability to urinate. °This is a common problem in older men. As men age their prostates become larger and block the flow of urine from the bladder. This is usually a problem that has come on gradually.  °HOME CARE INSTRUCTIONS °If you are sent home with a Foley catheter and a drainage system, you will need to discuss the best course of action with your health care provider. While the catheter is in, maintain a good intake of fluids. Keep the drainage bag emptied and lower than your catheter. This is so that contaminated urine will not flow back into your bladder, which could lead to a urinary tract infection. °There are two main types of drainage bags. One is a large bag that usually is used at night. It has a good capacity that will allow you to sleep through the night without having to empty it. The second type is called a leg bag. It has a smaller capacity, so it needs to be emptied more frequently. However, the main advantage is that it can be attached by a leg strap and can go underneath your clothing, allowing you the freedom to move about or leave your home. °Only take over-the-counter or prescription medicines for pain, discomfort, or fever as directed by your health care provider.  °SEEK MEDICAL CARE IF: °· You develop a low-grade fever. °· You experience spasms or leakage of urine with the spasms. °SEEK IMMEDIATE MEDICAL CARE IF:  °· You develop chills or fever. °· Your catheter stops draining urine. °· Your catheter falls out. °· You start to develop increased bleeding that does not respond to rest and increased fluid intake. °MAKE SURE YOU: °· Understand these instructions. °· Will watch your condition. °· Will get help right away if you are not doing well or get worse. °Document Released: 01/07/2001 Document Revised: 10/06/2013 Document Reviewed: 03/12/2013 °[employer]® Patient Information ©2015 [employer]. This information is not  intended to replace advice given to you by your health care provider. Make sure you discuss any questions you have with your health care provider. ° °

## 2014-05-26 NOTE — ED Provider Notes (Signed)
CSN: [id number]     Arrival date & time 05/26/14  1144 History   First MD Initiated Contact with Patient 05/26/14 1146     Chief Complaint  Patient presents with  . Shortness of Breath     HPI Patient presents emergency department because of some ongoing discomfort in his right flank.  He's had intermittent constipation followed by diarrhea over the past several months.  He's had incomplete emptying of his bladder is well.  He has urinary frequency without dysuria.  His urologist is planning on robotic prostatectomy for history of prostate cancer.  His urologist thinks that his urinary frequency is secondary to prostatic hypertrophy.  He reports his urologist does not believe that he is retaining urine.  He's tried MiraLAX for his constipation with only intermittent relief.  He was seen emergency room last night and had labs and a CT scan of his abdomen and pelvis done which demonstrated no significant abnormality but he did have mildly distended bladder on CT examination.  Patient with followup with his primary care physician today and the registration team felt as though he somewhat pale and they did an EKG which was concerning to them for atrial fibrillation.  On arrival to the emergency department he complains of no chest pain or shortness of breath.  He denies palpitations.  His EKG demonstrated sinus rhythm with frequent PACs.  He continues to complain of some right lower quadrant abdominal pain with urinary frequency.  Patient is on 10 mg hydrocodone for chronic pain   Past Medical History  Diagnosis Date  . COPD (chronic obstructive pulmonary disease)   . Hypertension   . Polio   . Bronchitis   . DJD (degenerative joint disease) of hip   . Angina pectoris   . Tuberculosis   . Prostate cancer   . Chicken pox   . Rheumatic fever   . Measles   . Mumps   . GERD (gastroesophageal reflux disease)   . Alcoholism    Past Surgical History  Procedure Laterality Date  . Rotator cuff  repair    . Other surgical history       Muscle & bone Graft/Polio  . Cholecystectomy    . Nose surgery    . Nasal septum surgery    . Kidney stone surgery    . Hand surgery    . Shoulder surgery      Arthritis & Bone Spurs  . Dental surgery     Family History  Problem Relation Age of Onset  . Alzheimer's disease Father 4    Deceased  . Stomach cancer Father   . Heart attack Father   . Heart disease Father   . Skin cancer Mother     Facial-Living  . Alcohol abuse Sister     x2  . Mental illness Sister     x2  . Diabetes Maternal Aunt     x2  . Thyroid disease Maternal Aunt     x4  . Diabetes Maternal Uncle   . Tuberculosis Paternal Grandfather   . Tuberculosis Paternal Grandmother   . Alzheimer's disease Paternal Aunt   . Alzheimer's disease Paternal Uncle    History  Substance Use Topics  . Smoking status: Former Smoker -- 32 years    Types: Pipe    Quit date: 08/15/2013  . Smokeless tobacco: Not on file  . Alcohol Use: Yes    Review of Systems  All other systems reviewed and are negative.  Allergies  Ivp dye; Penicillins; Aspirin; Morphine and related; and Nsaids  Home Medications   Prior to Admission medications   Medication Sig Start Date End Date Taking? Authorizing Provider  albuterol (PROVENTIL HFA;VENTOLIN HFA) 108 (90 BASE) MCG/ACT inhaler Inhale 1-2 puffs into the lungs every 6 (six) hours as needed for wheezing or shortness of breath. PROAIR 05/17/14   Brunetta Jeans, PA-C  albuterol (PROVENTIL) (2.5 MG/3ML) 0.083% nebulizer solution Take 2.5 mg by nebulization every 6 (six) hours as needed for wheezing or shortness of breath.    Historical Provider, MD  ALPRAZolam [name]) 0.5 MG tablet Take 1 tablet (0.5 mg total) by mouth 2 (two) times daily as needed for anxiety. 04/09/14   Brunetta Jeans, PA-C  amLODipine (NORVASC) 10 MG tablet TAKE 1 TABLET (10 MG TOTAL) BY MOUTH DAILY. 04/02/14   Brunetta Jeans, PA-C  BREO ELLIPTA 100-25 MCG/INH AEPB  INHALE 1 PUFF DAILY AND RINSE MOUTH AFTER USE. 05/12/14   Brunetta Jeans, PA-C  EPINEPHrine (EPIPEN) 0.3 mg/0.3 mL IJ SOAJ injection Inject 0.3 mLs (0.3 mg total) into the muscle once. 04/05/14   Brunetta Jeans, PA-C  famotidine (PEPCID) 20 MG tablet Take 1 tablet (20 mg total) by mouth 2 (two) times daily. 03/10/14   Brunetta Jeans, PA-C  gabapentin (NEURONTIN) 300 MG capsule One tab PO daily for a week, then twice daily for a week, then three times daily. 04/05/14   Brunetta Jeans, PA-C  HYDROcodone-acetaminophen (NORCO) 10-325 MG per tablet Take 1 tablet by mouth every 8 (eight) hours as needed. 04/09/14   Brunetta Jeans, PA-C  loratadine (CLARITIN) 10 MG tablet TAKE 1 TABLET BY MOUTH EVERY DAY 01/25/14   Brunetta Jeans, PA-C  losartan (COZAAR) 50 MG tablet TAKE 1 TABLET (50 MG TOTAL) BY MOUTH DAILY. 04/02/14   Brunetta Jeans, PA-C  montelukast (SINGULAIR) 10 MG tablet Take 10 mg by mouth at bedtime.    Historical Provider, MD  nitroGLYCERIN (NITROSTAT) 0.4 MG SL tablet Place 0.4 mg under the tongue every 5 (five) minutes as needed. For chest pain    Historical Provider, MD  ondansetron (ZOFRAN) 4 MG tablet Take 1 tablet (4 mg total) by mouth every 6 (six) hours. 05/15/14   Shari A Upstill, PA-C  polyethylene glycol powder (MIRALAX) powder Take 17 g by mouth daily. 05/26/14   Hoy Morn, MD  tamsulosin (FLOMAX) 0.4 MG CAPS capsule Take 1 capsule (0.4 mg total) by mouth daily. 05/26/14   Hoy Morn, MD   BP 137/62  Pulse 94  Temp(Src) 98.6 F (37 C) (Oral)  Resp 17  SpO2 100% Physical Exam  Nursing note and vitals reviewed. Constitutional: He is oriented to person, place, and time. He appears well-developed and well-nourished.  HENT:  Head: Normocephalic and atraumatic.  Eyes: EOM are normal.  Neck: Normal range of motion.  Cardiovascular: Normal rate, regular rhythm and normal heart sounds.   Pulmonary/Chest: Effort normal and breath sounds normal. No respiratory distress.   Abdominal: Soft. He exhibits no distension.  Mild lower abdominal tenderness right greater than left.  No guarding or rebound  Musculoskeletal: Normal range of motion.  Neurological: He is alert and oriented to person, place, and time.  Skin: Skin is warm and dry.  Psychiatric: He has a normal mood and affect. Judgment normal.    ED Course  Procedures (including critical care time) Labs Review Labs Reviewed  CBC WITH DIFFERENTIAL - Abnormal; Notable for the following:  Eosinophils Relative 7 (*)    All other components within normal limits  COMPREHENSIVE METABOLIC PANEL - Abnormal; Notable for the following:    Glucose, Bld 108 (*)    GFR calc non Af Amer 62 (*)    GFR calc Af Amer 72 (*)    All other components within normal limits  URINE CULTURE  TROPONIN I    Imaging Review Ct Abdomen Pelvis Wo Contrast  05/26/2014   CLINICAL DATA:  Right flank pain.  EXAM: CT ABDOMEN AND PELVIS WITHOUT CONTRAST  TECHNIQUE: Multidetector CT imaging of the abdomen and pelvis was performed following the standard protocol without IV contrast.  COMPARISON:  CT of the abdomen and pelvis from 03/13/2013  FINDINGS: Minimal scarring is noted at the left lung base.  The liver and spleen are unremarkable in appearance. The patient is status post cholecystectomy, with clips noted along the gallbladder fossa. The pancreas and adrenal glands are unremarkable.  Nonspecific perinephric stranding is noted bilaterally. The kidneys are otherwise unremarkable. There is no evidence of hydronephrosis. No renal or ureteral stones are seen. No perinephric stranding is appreciated.  No free fluid is identified. The small bowel is unremarkable in appearance. The stomach is within normal limits. No acute vascular abnormalities are seen. Minimal calcification is noted along the abdominal aorta.  The appendix is normal in caliber and contains trace of air, without evidence for appendicitis. The colon is unremarkable in  appearance.  The bladder is mildly distended; there appears to be a 3.5 cm diverticulum arising from the left side of the posterior bladder wall. The prostate remains normal in size. No inguinal lymphadenopathy is seen.  There is question of bilateral incompletely descended testes, somewhat diminutive in appearance. Would suggest clinical correlation.  No acute osseous abnormalities are identified.  IMPRESSION: 1. No acute abnormality seen within the abdomen or pelvis. 2. 3.5 cm left-sided bladder diverticulum incidentally noted. 3. Apparent bilateral incompletely descended testes, somewhat diminutive in appearance. Would suggest clinical correlation.   Electronically Signed   By: Garald Balding M.D.   On: 05/26/2014 01:10  I personally reviewed the imaging tests through PACS system I reviewed available ER/hospitalization records through the EMR    EKG Interpretation   Date/Time:  Wednesday May 26 2014 11:53:53 EDT Ventricular Rate:  93 PR Interval:  152 QRS Duration: 90 QT Interval:  326 QTC Calculation: 405 R Axis:   52 Text Interpretation:  Sinus rhythm with Premature atrial complexes in a  pattern of bigeminy Otherwise normal ECG PACs are new since prior ecg, no  other significant changes Confirmed by Kaelem Brach  MD, Lanis Storlie ([id number]) on  05/26/2014 12:56:01 PM      MDM   Final diagnoses:  Urinary retention  PAC (premature atrial contraction)    Patient completely voided at the bedside and ultrasound demonstrates ongoing distended bladder.  Foley catheter will be placed.  I suspect the patient has had urinary retention which has resulted in his change in bowel movements.  I suspect that if we decompress his bladder he will have more regular bowel movements.  My plan will be to likely send him home with the Foley catheter in place an outpatient urology followup.  He'll need to be placed on a medication such as Flomax to help with urine stream.  Nearly 500cc of urine drained from bladder.  Home with catheter. Some palpitations through the weekend. Home with cardiology follow up   Hoy Morn, MD 05/26/14 1330

## 2014-05-26 NOTE — Progress Notes (Signed)
Pre visit review using our clinic review tool, if applicable. No additional management support is needed unless otherwise documented below in the visit note/SLS  

## 2014-05-26 NOTE — Telephone Encounter (Signed)
Please call patient to see how he is doing with constipation and abdominal pain now that the ER doctors have emptied his bladder?  I see where they have told him to schedule an appointment with Cardiology.  I will place a referral to Cardiology so that his insurance will cover those office visits.  I have placed this as an urgent referral due to his constant symptoms.

## 2014-05-26 NOTE — Progress Notes (Signed)
Patient presents to clinic today c/o chest pain at rest with chest tightness, diaphoresis and nausea.  Patient endorses onset of pain this morning.  Tried to go to the ER but had to wait in line.  Scheduled an appointment with the office.  Patient denies lightheadedness or dizziness at time of interview.  Has chest pain at present that is like a pressure, left-sided and is moving into his shoulder.  Has history of Hypertension COPD and smoking history. EKG obtained immediately giving patient's symptoms which revealed new onset atrial fibrillation.   Past Medical History  Diagnosis Date  . COPD (chronic obstructive pulmonary disease)   . Hypertension   . Polio   . Bronchitis   . DJD (degenerative joint disease) of hip   . Angina pectoris   . Tuberculosis   . Prostate cancer   . Chicken pox   . Rheumatic fever   . Measles   . Mumps   . GERD (gastroesophageal reflux disease)   . Alcoholism     Current Outpatient Prescriptions on File Prior to Visit  Medication Sig Dispense Refill  . albuterol (PROVENTIL HFA;VENTOLIN HFA) 108 (90 BASE) MCG/ACT inhaler Inhale 1-2 puffs into the lungs every 6 (six) hours as needed for wheezing or shortness of breath. PROAIR  8.5 g  6  . albuterol (PROVENTIL) (2.5 MG/3ML) 0.083% nebulizer solution Take 2.5 mg by nebulization every 6 (six) hours as needed for wheezing or shortness of breath.      . ALPRAZolam (XANAX) 0.5 MG tablet Take 1 tablet (0.5 mg total) by mouth 2 (two) times daily as needed for anxiety.  60 tablet  0  . amLODipine (NORVASC) 10 MG tablet TAKE 1 TABLET (10 MG TOTAL) BY MOUTH DAILY.  90 tablet  0  . BREO ELLIPTA 100-25 MCG/INH AEPB INHALE 1 PUFF DAILY AND RINSE MOUTH AFTER USE.  60 each  5  . EPINEPHrine (EPIPEN) 0.3 mg/0.3 mL IJ SOAJ injection Inject 0.3 mLs (0.3 mg total) into the muscle once.  1 Device  2  . famotidine (PEPCID) 20 MG tablet Take 1 tablet (20 mg total) by mouth 2 (two) times daily.  60 tablet  1  . gabapentin (NEURONTIN)  300 MG capsule One tab PO daily for a week, then twice daily for a week, then three times daily.  180 capsule  3  . HYDROcodone-acetaminophen (NORCO) 10-325 MG per tablet Take 1 tablet by mouth every 8 (eight) hours as needed.  40 tablet  0  . loratadine (CLARITIN) 10 MG tablet TAKE 1 TABLET BY MOUTH EVERY DAY  30 tablet  0  . losartan (COZAAR) 50 MG tablet TAKE 1 TABLET (50 MG TOTAL) BY MOUTH DAILY.  90 tablet  0  . montelukast (SINGULAIR) 10 MG tablet Take 10 mg by mouth at bedtime.      . nitroGLYCERIN (NITROSTAT) 0.4 MG SL tablet Place 0.4 mg under the tongue every 5 (five) minutes as needed. For chest pain      . ondansetron (ZOFRAN) 4 MG tablet Take 1 tablet (4 mg total) by mouth every 6 (six) hours.  12 tablet  0   No current facility-administered medications on file prior to visit.    Allergies  Allergen Reactions  . Ivp Dye [Iodinated Diagnostic Agents] Anaphylaxis  . Penicillins Anaphylaxis    Heart stops  . Aspirin Other (See Comments)    Reaction unknown  . Morphine And Related Nausea And Vomiting  . Nsaids Other (See Comments)    Family  History  Problem Relation Age of Onset  . Alzheimer's disease Father 4    Deceased  . Stomach cancer Father   . Heart attack Father   . Heart disease Father   . Skin cancer Mother     Facial-Living  . Alcohol abuse Sister     x2  . Mental illness Sister     x2  . Diabetes Maternal Aunt     x2  . Thyroid disease Maternal Aunt     x4  . Diabetes Maternal Uncle   . Tuberculosis Paternal Grandfather   . Tuberculosis Paternal Grandmother   . Alzheimer's disease Paternal Aunt   . Alzheimer's disease Paternal Uncle     History   Social History  . Marital Status: Married    Spouse Name: N/A    Number of Children: N/A  . Years of Education: N/A   Social History Main Topics  . Smoking status: Former Smoker -- 40 years    Types: Pipe    Quit date: 08/15/2013  . Smokeless tobacco: None  . Alcohol Use: Yes  . Drug Use: No   . Sexual Activity: None   Other Topics Concern  . None   Social History Narrative  . None    Review of Systems - See HPI.  All other ROS are negative.  BP 154/78  Pulse 46  Temp(Src) 97.7 F (36.5 C) (Oral)  Resp 20  Ht 6\' 1"  (1.854 m)  Wt 212 lb (96.163 kg)  BMI 27.98 kg/m2  Physical Exam  Constitutional: He is oriented to person, place, and time. He appears distressed.  HENT:  Head: Normocephalic and atraumatic.  Eyes: Conjunctivae are normal.  Neck: Neck supple.  Cardiovascular: S1 normal and S2 normal.  An irregularly irregular rhythm present. Frequent extrasystoles are present. PMI is not displaced.   No murmur heard. Pulmonary/Chest: Effort normal and breath sounds normal. No respiratory distress. He has no wheezes. He has no rales. He exhibits no tenderness.  Neurological: He is alert and oriented to person, place, and time.  Skin: Skin is warm. No rash noted. He is diaphoretic.  Diaphoretic.  Psychiatric: Affect normal.   Assessment/Plan: Chest pain on breathing HR is alternating between bradycardia and being WNL.  EKG reveals Atrial fibrillation which is new for patient.  Due to new onset of arrhythmia and chest pain, patient triaged to ER for acute evaluation and treatment.

## 2014-05-26 NOTE — Assessment & Plan Note (Signed)
HR is alternating between bradycardia and being WNL.  EKG reveals Atrial fibrillation which is new for patient.  Due to new onset of arrhythmia and chest pain, patient triaged to ER for acute evaluation and treatment.

## 2014-05-26 NOTE — ED Notes (Signed)
Pt discharged to home with family. NAD.  

## 2014-05-26 NOTE — ED Provider Notes (Signed)
CSN: [id number]     Arrival date & time 05/25/14  2217 History   First MD Initiated Contact with Patient 05/25/14 2357     Chief Complaint  Patient presents with  . Flank Pain     (Consider location/radiation/quality/duration/timing/severity/associated sxs/prior Treatment) Patient is a 63 y.o. male presenting with flank pain. The history is provided by the patient.  Flank Pain This is a new problem. The current episode started more than 1 week ago. The problem occurs constantly. The problem has not changed since onset.Pertinent negatives include no chest pain, no abdominal pain, no headaches and no shortness of breath. Associated symptoms comments: Groin pain, constipation with alternating diarrhea from enemas for months. Nothing aggravates the symptoms. Nothing relieves the symptoms. He has tried nothing for the symptoms. The treatment provided no relief.    Past Medical History  Diagnosis Date  . COPD (chronic obstructive pulmonary disease)   . Hypertension   . Polio   . Bronchitis   . DJD (degenerative joint disease) of hip   . Angina pectoris   . Tuberculosis   . Prostate cancer   . Chicken pox   . Rheumatic fever   . Measles   . Mumps   . GERD (gastroesophageal reflux disease)   . Alcoholism    Past Surgical History  Procedure Laterality Date  . Rotator cuff repair    . Other surgical history       Muscle & bone Graft/Polio  . Cholecystectomy    . Nose surgery    . Nasal septum surgery    . Kidney stone surgery    . Hand surgery    . Shoulder surgery      Arthritis & Bone Spurs  . Dental surgery     Family History  Problem Relation Age of Onset  . Alzheimer's disease Father 4    Deceased  . Stomach cancer Father   . Heart attack Father   . Heart disease Father   . Skin cancer Mother     Facial-Living  . Alcohol abuse Sister     x2  . Mental illness Sister     x2  . Diabetes Maternal Aunt     x2  . Thyroid disease Maternal Aunt     x4  . Diabetes  Maternal Uncle   . Tuberculosis Paternal Grandfather   . Tuberculosis Paternal Grandmother   . Alzheimer's disease Paternal Aunt   . Alzheimer's disease Paternal Uncle    History  Substance Use Topics  . Smoking status: Former Smoker -- 7 years    Types: Pipe    Quit date: 08/15/2013  . Smokeless tobacco: Not on file  . Alcohol Use: Yes    Review of Systems  Constitutional: Negative for fever.  Respiratory: Negative for chest tightness and shortness of breath.   Cardiovascular: Negative for chest pain and palpitations.  Gastrointestinal: Positive for diarrhea and constipation. Negative for nausea, vomiting, abdominal pain and abdominal distention.  Genitourinary: Positive for flank pain. Negative for dysuria.  Neurological: Negative for headaches.  All other systems reviewed and are negative.     Allergies  Ivp dye; Penicillins; Aspirin; Morphine and related; and Nsaids  Home Medications   Prior to Admission medications   Medication Sig Start Date End Date Taking? Authorizing Provider  albuterol (PROVENTIL HFA;VENTOLIN HFA) 108 (90 BASE) MCG/ACT inhaler Inhale 1-2 puffs into the lungs every 6 (six) hours as needed for wheezing or shortness of breath. PROAIR 05/17/14   Brunetta Jeans,  PA-C  albuterol (PROVENTIL) (2.5 MG/3ML) 0.083% nebulizer solution Take 2.5 mg by nebulization every 6 (six) hours as needed for wheezing or shortness of breath.    Historical Provider, MD  ALPRAZolam [name]) 0.5 MG tablet Take 1 tablet (0.5 mg total) by mouth 2 (two) times daily as needed for anxiety. 04/09/14   Brunetta Jeans, PA-C  amLODipine (NORVASC) 10 MG tablet TAKE 1 TABLET (10 MG TOTAL) BY MOUTH DAILY. 04/02/14   Brunetta Jeans, PA-C  BREO ELLIPTA 100-25 MCG/INH AEPB INHALE 1 PUFF DAILY AND RINSE MOUTH AFTER USE. 05/12/14   Brunetta Jeans, PA-C  EPINEPHrine (EPIPEN) 0.3 mg/0.3 mL IJ SOAJ injection Inject 0.3 mLs (0.3 mg total) into the muscle once. 04/05/14   Brunetta Jeans, PA-C   famotidine (PEPCID) 20 MG tablet Take 1 tablet (20 mg total) by mouth 2 (two) times daily. 03/10/14   Brunetta Jeans, PA-C  gabapentin (NEURONTIN) 300 MG capsule One tab PO daily for a week, then twice daily for a week, then three times daily. 04/05/14   Brunetta Jeans, PA-C  HYDROcodone-acetaminophen (NORCO) 10-325 MG per tablet Take 1 tablet by mouth every 8 (eight) hours as needed. 04/09/14   Brunetta Jeans, PA-C  loratadine (CLARITIN) 10 MG tablet TAKE 1 TABLET BY MOUTH EVERY DAY 01/25/14   Brunetta Jeans, PA-C  losartan (COZAAR) 50 MG tablet TAKE 1 TABLET (50 MG TOTAL) BY MOUTH DAILY. 04/02/14   Brunetta Jeans, PA-C  montelukast (SINGULAIR) 10 MG tablet Take 10 mg by mouth at bedtime.    Historical Provider, MD  nitroGLYCERIN (NITROSTAT) 0.4 MG SL tablet Place 0.4 mg under the tongue every 5 (five) minutes as needed. For chest pain    Historical Provider, MD  ondansetron (ZOFRAN) 4 MG tablet Take 1 tablet (4 mg total) by mouth every 6 (six) hours. 05/15/14   Shari A Upstill, PA-C   BP 149/82  Pulse 78  Temp(Src) 97.7 F (36.5 C) (Oral)  Resp 20  Ht 6\' 1"  (1.854 m)  Wt 212 lb (96.163 kg)  BMI 27.98 kg/m2  SpO2 100% Physical Exam  Constitutional: He is oriented to person, place, and time. He appears well-developed and well-nourished. No distress.  HENT:  Head: Normocephalic and atraumatic.  Mouth/Throat: Oropharynx is clear and moist.  Eyes: Conjunctivae are normal. Pupils are equal, round, and reactive to light.  Neck: Normal range of motion. Neck supple.  Cardiovascular: Normal rate, regular rhythm and intact distal pulses.   Pulmonary/Chest: Effort normal and breath sounds normal. He has no wheezes. He has no rales.  Abdominal: Soft. Bowel sounds are normal. There is no tenderness. There is no rebound and no guarding.  Musculoskeletal: Normal range of motion.  Neurological: He is alert and oriented to person, place, and time.  Skin: Skin is warm and dry.  Psychiatric: He has  a normal mood and affect.    ED Course  Procedures (including critical care time) Labs Review Labs Reviewed  URINALYSIS, ROUTINE W REFLEX MICROSCOPIC    Imaging Review Ct Abdomen Pelvis Wo Contrast  05/26/2014   CLINICAL DATA:  Right flank pain.  EXAM: CT ABDOMEN AND PELVIS WITHOUT CONTRAST  TECHNIQUE: Multidetector CT imaging of the abdomen and pelvis was performed following the standard protocol without IV contrast.  COMPARISON:  CT of the abdomen and pelvis from 03/13/2013  FINDINGS: Minimal scarring is noted at the left lung base.  The liver and spleen are unremarkable in appearance. The patient is status post  cholecystectomy, with clips noted along the gallbladder fossa. The pancreas and adrenal glands are unremarkable.  Nonspecific perinephric stranding is noted bilaterally. The kidneys are otherwise unremarkable. There is no evidence of hydronephrosis. No renal or ureteral stones are seen. No perinephric stranding is appreciated.  No free fluid is identified. The small bowel is unremarkable in appearance. The stomach is within normal limits. No acute vascular abnormalities are seen. Minimal calcification is noted along the abdominal aorta.  The appendix is normal in caliber and contains trace of air, without evidence for appendicitis. The colon is unremarkable in appearance.  The bladder is mildly distended; there appears to be a 3.5 cm diverticulum arising from the left side of the posterior bladder wall. The prostate remains normal in size. No inguinal lymphadenopathy is seen.  There is question of bilateral incompletely descended testes, somewhat diminutive in appearance. Would suggest clinical correlation.  No acute osseous abnormalities are identified.  IMPRESSION: 1. No acute abnormality seen within the abdomen or pelvis. 2. 3.5 cm left-sided bladder diverticulum incidentally noted. 3. Apparent bilateral incompletely descended testes, somewhat diminutive in appearance. Would suggest clinical  correlation.   Electronically Signed   By: Garald Balding M.D.   On: 05/26/2014 01:10     EKG Interpretation None      MDM   Final diagnoses:  None    Suspect muscle strain from alternating BMs.  Will add lidoderm patches and robaxin.  Is already taking narcotics.  Stop enemas take miralax BID and start probiotics in the form of greek yogurt.  Follow up with your PMD and GI for ongoing care    Tan Clopper K Rivan Siordia-Rasch, MD 05/26/14 [phone]

## 2014-05-28 ENCOUNTER — Ambulatory Visit (INDEPENDENT_AMBULATORY_CARE_PROVIDER_SITE_OTHER): Payer: Medicare HMO | Admitting: Cardiology

## 2014-05-28 ENCOUNTER — Encounter: Payer: Self-pay | Admitting: Cardiology

## 2014-05-28 VITALS — BP 131/75 | HR 73 | Ht 73.0 in | Wt 214.8 lb

## 2014-05-28 DIAGNOSIS — R0789 Other chest pain: Secondary | ICD-10-CM

## 2014-05-28 DIAGNOSIS — R0600 Dyspnea, unspecified: Secondary | ICD-10-CM

## 2014-05-28 DIAGNOSIS — I4949 Other premature depolarization: Secondary | ICD-10-CM

## 2014-05-28 DIAGNOSIS — I493 Ventricular premature depolarization: Secondary | ICD-10-CM

## 2014-05-28 DIAGNOSIS — R0989 Other specified symptoms and signs involving the circulatory and respiratory systems: Secondary | ICD-10-CM

## 2014-05-28 DIAGNOSIS — R0609 Other forms of dyspnea: Secondary | ICD-10-CM

## 2014-05-28 DIAGNOSIS — R079 Chest pain, unspecified: Secondary | ICD-10-CM

## 2014-05-28 LAB — URINE CULTURE
COLONY COUNT: NO GROWTH
CULTURE: NO GROWTH

## 2014-05-28 NOTE — Progress Notes (Signed)
[name] Date of Birth:  1951/09/28 [facility] [address] [place], [place]  [address] [phone]        Fax   [phone]   History of Present Illness: This 63 year old gentleman is seen at the request of Dr. Hassell Done for evaluation of chest pain and possibly arrhythmia.  The patient was in his usual state of health until 1 AM on August 10 when he developed severe chest discomfort and dyspnea and was taken to the emergency room.  He was observed all day.  He was given morphine and sublingual nitroglycerin.  He ruled out for myocardial infarction and was discharged from the emergency room.  The next day he had recurrent discomfort and went to [facility] where he was again observed and released.  On Wednesday, August 12 the patient developed chest pain after drinking Coca-Cola.  He went to Dr. Earlie Server office.  His family thought he looked pale.  His initial EKG done today her suggested possible atrial fibrillation by computer reading although subsequent review shows that it was not atrial fibrillation but merely sinus rhythm with frequent premature atrial beats.  He was seen again in the [facility] emergency room and was released since then he has had no further severe discomfort he gives a history of having COPD.  He has been on disability for the past 10 years because of his COPD.  Also has a history of polio at age 24 and tuberculosis in 19.  He has a past history of hypertension but is not diabetic.  He has a history of prostate cancer and anticipates proceeding with prostate cancer surgery soon.  He's had urinary retention and has a Foley catheter in place which is scheduled to be removed on August 17 The patient has a past history of being a heavy pipe smoker until last year.  He has a past history of significant alcohol intake and is cut back on his alcohol to just 3 beers per night The patient states that about 6 years ago he had a heart  catheterization in [facility] which showed no significant oxygen he did not require PCI.  Current Outpatient Prescriptions  Medication Sig Dispense Refill  . albuterol (PROVENTIL HFA;VENTOLIN HFA) 108 (90 BASE) MCG/ACT inhaler Inhale 1-2 puffs into the lungs every 6 (six) hours as needed for wheezing or shortness of breath. PROAIR  8.5 g  6  . albuterol (PROVENTIL) (2.5 MG/3ML) 0.083% nebulizer solution Take 2.5 mg by nebulization every 6 (six) hours as needed for wheezing or shortness of breath.      . ALPRAZolam (XANAX) 0.5 MG tablet Take 1 tablet (0.5 mg total) by mouth 2 (two) times daily as needed for anxiety.  60 tablet  0  . amLODipine (NORVASC) 10 MG tablet TAKE 1 TABLET (10 MG TOTAL) BY MOUTH DAILY.  90 tablet  0  . BREO ELLIPTA 100-25 MCG/INH AEPB INHALE 1 PUFF DAILY AND RINSE MOUTH AFTER USE.  60 each  5  . EPINEPHrine (EPIPEN) 0.3 mg/0.3 mL IJ SOAJ injection Inject 0.3 mLs (0.3 mg total) into the muscle once.  1 Device  2  . famotidine (PEPCID) 20 MG tablet Take 1 tablet (20 mg total) by mouth 2 (two) times daily.  60 tablet  1  . HYDROcodone-acetaminophen (NORCO) 10-325 MG per tablet Take 1 tablet by mouth every 8 (eight) hours as needed.  40 tablet  0  . loratadine (CLARITIN) 10 MG  tablet TAKE 1 TABLET BY MOUTH EVERY DAY  30 tablet  0  . losartan (COZAAR) 50 MG tablet TAKE 1 TABLET (50 MG TOTAL) BY MOUTH DAILY.  90 tablet  0  . montelukast (SINGULAIR) 10 MG tablet Take 10 mg by mouth at bedtime.      . nitroGLYCERIN (NITROSTAT) 0.4 MG SL tablet Place 0.4 mg under the tongue every 5 (five) minutes as needed. For chest pain      . polyethylene glycol powder (MIRALAX) powder Take 17 g by mouth daily.  255 g  0  . gabapentin (NEURONTIN) 300 MG capsule One tab PO daily for a week, then twice daily for a week, then three times daily.  180 capsule  3  . ondansetron (ZOFRAN) 4 MG tablet Take 1 tablet (4 mg total) by mouth every 6 (six) hours.  12 tablet  0  . tamsulosin (FLOMAX) 0.4  MG CAPS capsule Take 1 capsule (0.4 mg total) by mouth daily.  20 capsule  0   No current facility-administered medications for this visit.    Allergies  Allergen Reactions  . Ivp Dye [Iodinated Diagnostic Agents] Anaphylaxis  . Penicillins Anaphylaxis    Heart stops  . Aspirin Other (See Comments)    Reaction unknown  . Morphine And Related Nausea And Vomiting  . Nsaids Other (See Comments)    Patient Active Problem List   Diagnosis Date Noted  . Chest pain on breathing 05/26/2014  . Unspecified constipation 04/29/2014  . Spider bite 04/29/2014  . Dehydration 04/13/2014  . Neuropathy 04/11/2014  . Concussion with no loss of consciousness 04/11/2014  . Chronic knee pain 02/22/2014  . Other malaise and fatigue 01/18/2014  . Post-polio syndrome 01/13/2014  . COPD (chronic obstructive pulmonary disease) 01/13/2014  . Hypertension 01/13/2014  . GERD (gastroesophageal reflux disease) 01/13/2014  . Former smoker 01/13/2014  . Prostate cancer 01/13/2014  . History of tuberculosis 01/13/2014  . Bipolar disorder 01/13/2014  . Explosive personality disorder 01/13/2014  . Erectile dysfunction 01/13/2014  . Nephrolithiasis 01/13/2014  . Chronic pain 01/13/2014  . Alcoholism 01/13/2014  . OSA (obstructive sleep apnea) 01/13/2014    History  Smoking status  . Former Smoker -- 66 years  . Types: Pipe  . Quit date: 08/15/2013  Smokeless tobacco  . Not on file    History  Alcohol Use  . Yes    Family History  Problem Relation Age of Onset  . Alzheimer's disease Father 4    Deceased  . Stomach cancer Father   . Heart attack Father   . Heart disease Father had his first myocardial infarction in his 13s.     . Skin cancer Mother     Facial-Living  . Alcohol abuse Sister     x2  . Mental illness Sister     x2  . Diabetes Maternal Aunt     x2  . Thyroid disease Maternal Aunt     x4  . Diabetes Maternal Uncle   . Tuberculosis Paternal Grandfather   . Tuberculosis  Paternal Grandmother   . Alzheimer's disease Paternal Aunt   . Alzheimer's disease Paternal Uncle     Review of Systems: Constitutional: no fever chills diaphoresis or fatigue or change in weight.  Head and neck: no hearing loss, no epistaxis, no photophobia or visual disturbance. Respiratory: No cough, shortness of breath or wheezing. Cardiovascular: No chest pain peripheral edema, palpitations. Gastrointestinal: No abdominal distention, no abdominal pain, no change in bowel habits hematochezia or  melena. Genitourinary: No dysuria, no frequency, no urgency, no nocturia. Musculoskeletal:No arthralgias, no back pain, no gait disturbance or myalgias. Neurological: No dizziness, no headaches, no numbness, no seizures, no syncope, no weakness, no tremors. Hematologic: No lymphadenopathy, no easy bruising. Psychiatric: No confusion, no hallucinations, no sleep disturbance.    Physical Exam: Filed Vitals:   05/28/14 1000  BP: 131/75  Pulse: 73   the general appearance reveals a well-developed well-nourished gentleman in no distress.The head and neck exam reveals pupils equal and reactive.  Extraocular movements are full.  There is no scleral icterus.  The mouth and pharynx are normal.  The neck is supple.  The carotids reveal no bruits.  The jugular venous pressure is normal.  The  thyroid is not enlarged.  There is no lymphadenopathy.  The chest is clear to percussion and auscultation.  There are no rales or rhonchi.  Expansion of the chest is symmetrical.  The precordium is quiet.  The first heart sound is normal.  The second heart sound is physiologically split.  There is no murmur gallop rub or click.  There is no abnormal lift or heave.  The abdomen is soft and nontender.  The bowel sounds are normal.  The liver and spleen are not enlarged.  There are no abdominal masses.  There are no abdominal bruits.  Extremities reveal good pedal pulses.  There is no phlebitis or edema.  There is no  cyanosis or clubbing.  Strength is normal and symmetrical in all extremities.  There is no lateralizing weakness.  There are no sensory deficits.  The skin is warm and dry.  There is no rash.  EKG today shows sinus rhythm with occasional PACs.  No ischemic changes at rest    Assessment / Plan: 1. frequent premature atrial contractions.  No documented atrial fibrillation.  Review of prior EKGs shows computer misread the rhythm. 2. atypical chest pain 3. COPD on disability 4.  Hypertensive cardiovascular disease without heart failure 5. past history of significant alcohol intake 6.  Urinary retention 7. prostate cancer, anticipating prostate surgery soon.  Plan: We will have him return for a Lexi scan Myoview stress to evaluate his chest pain and to clear him for possible major urologic surgery. Many thanks for the opportunity to see this pleasant gentleman with you

## 2014-05-28 NOTE — Patient Instructions (Signed)
Your physician recommends that you continue on your current medications as directed. Please refer to the Current Medication list given to you today.  Your physician has requested that you have a lexiscan myoview. For further information please visit [email or web address]. Please follow instruction sheet, as given.  Follow up as needed  

## 2014-05-31 DIAGNOSIS — R338 Other retention of urine: Secondary | ICD-10-CM | POA: Insufficient documentation

## 2014-06-01 ENCOUNTER — Telehealth: Payer: Self-pay

## 2014-06-01 DIAGNOSIS — K219 Gastro-esophageal reflux disease without esophagitis: Secondary | ICD-10-CM

## 2014-06-01 DIAGNOSIS — C61 Malignant neoplasm of prostate: Secondary | ICD-10-CM

## 2014-06-01 NOTE — Telephone Encounter (Signed)
Pt stated that "he needs Dr. Hassell Done to refer him to a Urologist that does Orthoscopic surgery for Prostate Cancer. Also he needs Gastrologist asap. He had an appt with one today but its too far. He says he's in urgent need of these referrals. He wants a call back today." LDM

## 2014-06-01 NOTE — Telephone Encounter (Signed)
Referrals have been placed.  It will take a couple of days to hear back from the specialists once the coordinator has found specialists in the area that fit his specific requests.

## 2014-06-02 NOTE — Telephone Encounter (Signed)
Patient returned phone call. Informed him of this.

## 2014-06-02 NOTE — Telephone Encounter (Signed)
Called pt, no answer. Left message on voice mail to return call. LDM

## 2014-06-03 ENCOUNTER — Encounter (HOSPITAL_COMMUNITY): Payer: Self-pay

## 2014-06-03 ENCOUNTER — Encounter: Payer: Self-pay | Admitting: Physician Assistant

## 2014-06-03 ENCOUNTER — Ambulatory Visit (INDEPENDENT_AMBULATORY_CARE_PROVIDER_SITE_OTHER): Payer: Medicare HMO | Admitting: Physician Assistant

## 2014-06-03 VITALS — BP 102/78 | HR 88 | Temp 98.3°F | Resp 18 | Ht 73.0 in | Wt 211.5 lb

## 2014-06-03 DIAGNOSIS — N323 Diverticulum of bladder: Secondary | ICD-10-CM

## 2014-06-03 DIAGNOSIS — F419 Anxiety disorder, unspecified: Secondary | ICD-10-CM

## 2014-06-03 DIAGNOSIS — K5900 Constipation, unspecified: Secondary | ICD-10-CM

## 2014-06-03 DIAGNOSIS — F411 Generalized anxiety disorder: Secondary | ICD-10-CM

## 2014-06-03 DIAGNOSIS — C61 Malignant neoplasm of prostate: Secondary | ICD-10-CM

## 2014-06-03 DIAGNOSIS — R071 Chest pain on breathing: Secondary | ICD-10-CM

## 2014-06-03 MED ORDER — ALPRAZOLAM 1 MG PO TABS: 1.0000 mg | ORAL_TABLET | Freq: Three times a day (TID) | ORAL | Status: DC | PRN

## 2014-06-03 NOTE — Progress Notes (Signed)
Patient presents to clinic today for ER follow-up.  Patient presented to the office on 05/26/14 with complaints of chest pain, SOB and anuria.  EKG was obtained showing potential new-onset atrial fibrillation.  Patient was then triaged to the ER for acute assessment and management.  In the ER chest pain and SOB resolved on its own.  Repeat EKG revealed some PACs but no atrial fibrillation.  CT abdomen/pelvis obtained due to abdominal pain and anuria.  CT revealed large diverticulum of bladder.  Patient was [name] for PVR and found to have a full bladder.  Catheter was placed with substantial output. Patient started on Flomax.  Discharged with follow-up to Cardiology, Urology and Primary Care.  Since visit, patient has been doing much better.  Is getting good output from catheter. Is having regular bowel movements.  Is still having intermittent chest pain but denies SOB, palpitations, lightheadedness or dizziness.  Denies new or worsening symptoms. Is having an upcoming laparascopic prostatectomy for prostate carcinoma, pending clearance by Cardiology.  Has appointment for Stress Myoview on 8/26.  Patient endorses taking his medications as directed.  Past Medical History  Diagnosis Date  . COPD (chronic obstructive pulmonary disease)   . Hypertension   . Polio   . Bronchitis   . DJD (degenerative joint disease) of hip   . Angina pectoris   . Tuberculosis   . Prostate cancer   . Chicken pox   . Rheumatic fever   . Measles   . Mumps   . GERD (gastroesophageal reflux disease)   . Alcoholism     Current Outpatient Prescriptions on File Prior to Visit  Medication Sig Dispense Refill  . albuterol (PROVENTIL) (2.5 MG/3ML) 0.083% nebulizer solution Take 2.5 mg by nebulization every 6 (six) hours as needed for wheezing or shortness of breath.      Marland Kitchen amLODipine (NORVASC) 10 MG tablet TAKE 1 TABLET (10 MG TOTAL) BY MOUTH DAILY.  90 tablet  0  . EPINEPHrine (EPIPEN) 0.3 mg/0.3 mL IJ SOAJ injection Inject 0.3  mLs (0.3 mg total) into the muscle once.  1 Device  2  . famotidine (PEPCID) 20 MG tablet Take 1 tablet (20 mg total) by mouth 2 (two) times daily.  60 tablet  1  . HYDROcodone-acetaminophen (NORCO) 10-325 MG per tablet Take 1 tablet by mouth every 8 (eight) hours as needed.  40 tablet  0  . loratadine (CLARITIN) 10 MG tablet TAKE 1 TABLET BY MOUTH EVERY DAY  30 tablet  0  . losartan (COZAAR) 50 MG tablet TAKE 1 TABLET (50 MG TOTAL) BY MOUTH DAILY.  90 tablet  0  . montelukast (SINGULAIR) 10 MG tablet Take 10 mg by mouth at bedtime.      . nitroGLYCERIN (NITROSTAT) 0.4 MG SL tablet Place 0.4 mg under the tongue every 5 (five) minutes as needed. For chest pain      . ondansetron (ZOFRAN) 4 MG tablet Take 1 tablet (4 mg total) by mouth every 6 (six) hours.  12 tablet  0  . polyethylene glycol powder (MIRALAX) powder Take 17 g by mouth daily.  255 g  0  . tamsulosin (FLOMAX) 0.4 MG CAPS capsule Take 1 capsule (0.4 mg total) by mouth daily.  20 capsule  0   No current facility-administered medications on file prior to visit.    Allergies  Allergen Reactions  . Ivp Dye [Iodinated Diagnostic Agents] Anaphylaxis  . Penicillins Anaphylaxis    Heart stops  . Aspirin Other (See Comments)  Reaction unknown  . Morphine And Related Nausea And Vomiting  . Nsaids Other (See Comments)    Family History  Problem Relation Age of Onset  . Alzheimer's disease Father 4    Deceased  . Stomach cancer Father   . Heart attack Father   . Heart disease Father   . Skin cancer Mother     Facial-Living  . Alcohol abuse Sister     x2  . Mental illness Sister     x2  . Diabetes Maternal Aunt     x2  . Thyroid disease Maternal Aunt     x4  . Diabetes Maternal Uncle   . Tuberculosis Paternal Grandfather   . Tuberculosis Paternal Grandmother   . Alzheimer's disease Paternal Aunt   . Alzheimer's disease Paternal Uncle     History   Social History  . Marital Status: Married    Spouse Name: N/A     Number of Children: N/A  . Years of Education: N/A   Social History Main Topics  . Smoking status: Former Smoker -- 39 years    Types: Pipe    Quit date: 08/15/2013  . Smokeless tobacco: None  . Alcohol Use: Yes  . Drug Use: No  . Sexual Activity: None   Other Topics Concern  . None   Social History Narrative  . None   Review of Systems - See HPI.  All other ROS are negative.  BP 102/78  Pulse 88  Temp(Src) 98.3 F (36.8 C) (Oral)  Resp 18  Ht _0  (1.854 m)  Wt 211 lb 8 oz (95.936 kg)  BMI 27.91 kg/m2  SpO2 98%  Physical Exam  Vitals reviewed. Constitutional: He is oriented to person, place, and time and well-developed, well-nourished, and in no distress.  HENT:  Head: Normocephalic and atraumatic.  Eyes: Conjunctivae are normal.  Neck: Neck supple.  Cardiovascular: Normal rate, regular rhythm, normal heart sounds and intact distal pulses.   Pulmonary/Chest: Effort normal and breath sounds normal. No respiratory distress. He has no wheezes. He has no rales. He exhibits no tenderness.  Abdominal: Soft. Bowel sounds are normal. He exhibits no distension and no mass. There is no tenderness. There is no rebound and no guarding.  Neurological: He is alert and oriented to person, place, and time.  Skin: Skin is warm and dry. No rash noted.    Recent Results (from the past 2160 hour(s))  HEMOGLOBIN A1C     Status: Abnormal   Collection Time    04/05/14 11:45 AM      Result Value Ref Range   Hemoglobin A1C 6.0 (*) <5.7 %   Comment:                                                                            According to the ADA Clinical Practice Recommendations for 2011, when     HbA1c is used as a screening test:             >=6.5%   Diagnostic of Diabetes Mellitus                (if abnormal result is confirmed)  5.7-6.4%   Increased risk of developing Diabetes Mellitus           References:Diagnosis and Classification of Diabetes Mellitus,Diabetes      KDXI,3382,50(NLZJQ 1):S62-S69 and Standards of Medical Care in             Diabetes - 2011,Diabetes BHAL,9379,02 (Suppl 1):S11-S61.         Mean Plasma Glucose 126 (*) <117 mg/dL  URINALYSIS, ROUTINE W REFLEX MICROSCOPIC     Status: None   Collection Time    04/06/14  1:26 AM      Result Value Ref Range   Color, Urine YELLOW  YELLOW   APPearance CLEAR  CLEAR   Specific Gravity, Urine 1.015  1.005 - 1.030   pH 5.5  5.0 - 8.0   Glucose, UA NEGATIVE  NEGATIVE mg/dL   Hgb urine dipstick NEGATIVE  NEGATIVE   Bilirubin Urine NEGATIVE  NEGATIVE   Ketones, ur NEGATIVE  NEGATIVE mg/dL   Protein, ur NEGATIVE  NEGATIVE mg/dL   Urobilinogen, UA 0.2  0.0 - 1.0 mg/dL   Nitrite NEGATIVE  NEGATIVE   Leukocytes, UA NEGATIVE  NEGATIVE   Comment: MICROSCOPIC NOT DONE ON URINES WITH NEGATIVE PROTEIN, BLOOD, LEUKOCYTES, NITRITE, OR GLUCOSE <1000 mg/dL.  CBC WITH DIFFERENTIAL     Status: None   Collection Time    04/06/14  2:16 AM      Result Value Ref Range   WBC 6.8  4.0 - 10.5 K/uL   RBC 4.35  4.22 - 5.81 MIL/uL   Hemoglobin 14.4  13.0 - 17.0 g/dL   HCT 41.2  39.0 - 52.0 %   MCV 94.7  78.0 - 100.0 fL   MCH 33.1  26.0 - 34.0 pg   MCHC 35.0  30.0 - 36.0 g/dL   RDW 12.7  11.5 - 15.5 %   Platelets 285  150 - 400 K/uL   Neutrophils Relative % 64  43 - 77 %   Neutro Abs 4.3  1.7 - 7.7 K/uL   Lymphocytes Relative 19  12 - 46 %   Lymphs Abs 1.3  0.7 - 4.0 K/uL   Monocytes Relative 11  3 - 12 %   Monocytes Absolute 0.7  0.1 - 1.0 K/uL   Eosinophils Relative 5  0 - 5 %   Eosinophils Absolute 0.3  0.0 - 0.7 K/uL   Basophils Relative 1  0 - 1 %   Basophils Absolute 0.1  0.0 - 0.1 K/uL  COMPREHENSIVE METABOLIC PANEL     Status: Abnormal   Collection Time    04/06/14  2:16 AM      Result Value Ref Range   Sodium 134 (*) 137 - 147 mEq/L   Potassium 4.2  3.7 - 5.3 mEq/L   Chloride 97  96 - 112 mEq/L   CO2 23  19 - 32 mEq/L   Glucose, Bld 138 (*) 70 - 99 mg/dL   BUN 18  6 - 23 mg/dL   Creatinine,  Ser 1.30  0.50 - 1.35 mg/dL   Calcium 9.8  8.4 - 10.5 mg/dL   Total Protein 7.4  6.0 - 8.3 g/dL   Albumin 4.2  3.5 - 5.2 g/dL   AST 24  0 - 37 U/L   ALT 28  0 - 53 U/L   Alkaline Phosphatase 69  39 - 117 U/L   Total Bilirubin 0.5  0.3 - 1.2 mg/dL   GFR calc non Af Amer 56 (*) >90 mL/min  GFR calc Af Amer 65 (*) >90 mL/min   Comment: (NOTE)     The eGFR has been calculated using the CKD EPI equation.     This calculation has not been validated in all clinical situations.     eGFR's persistently <90 mL/min signify possible Chronic Kidney     Disease.  LIPASE, BLOOD     Status: None   Collection Time    04/06/14  2:16 AM      Result Value Ref Range   Lipase 45  11 - 59 U/L  CBC WITH DIFFERENTIAL     Status: None   Collection Time    05/15/14  2:10 PM      Result Value Ref Range   WBC 6.1  4.0 - 10.5 K/uL   RBC 4.44  4.22 - 5.81 MIL/uL   Hemoglobin 14.3  13.0 - 17.0 g/dL   HCT 41.7  39.0 - 52.0 %   MCV 93.9  78.0 - 100.0 fL   MCH 32.2  26.0 - 34.0 pg   MCHC 34.3  30.0 - 36.0 g/dL   RDW 12.4  11.5 - 15.5 %   Platelets 236  150 - 400 K/uL   Neutrophils Relative % 64  43 - 77 %   Neutro Abs 3.9  1.7 - 7.7 K/uL   Lymphocytes Relative 23  12 - 46 %   Lymphs Abs 1.4  0.7 - 4.0 K/uL   Monocytes Relative 8  3 - 12 %   Monocytes Absolute 0.5  0.1 - 1.0 K/uL   Eosinophils Relative 4  0 - 5 %   Eosinophils Absolute 0.3  0.0 - 0.7 K/uL   Basophils Relative 1  0 - 1 %   Basophils Absolute 0.0  0.0 - 0.1 K/uL  BASIC METABOLIC PANEL     Status: Abnormal   Collection Time    05/15/14  2:10 PM      Result Value Ref Range   Sodium 139  137 - 147 mEq/L   Potassium 4.6  3.7 - 5.3 mEq/L   Chloride 100  96 - 112 mEq/L   CO2 26  19 - 32 mEq/L   Glucose, Bld 123 (*) 70 - 99 mg/dL   BUN 13  6 - 23 mg/dL   Creatinine, Ser 1.20  0.50 - 1.35 mg/dL   Calcium 9.7  8.4 - 10.5 mg/dL   GFR calc non Af Amer 62 (*) >90 mL/min   GFR calc Af Amer 72 (*) >90 mL/min   Comment: (NOTE)     The eGFR has  been calculated using the CKD EPI equation.     This calculation has not been validated in all clinical situations.     eGFR's persistently <90 mL/min signify possible Chronic Kidney     Disease.   Anion gap 13  5 - 15  TROPONIN I     Status: None   Collection Time    05/15/14  2:10 PM      Result Value Ref Range   Troponin I <0.30  <0.30 ng/mL   Comment:            Due to the release kinetics of cTnI,     a negative result within the first hours     of the onset of symptoms does not rule out     myocardial infarction with certainty.     If myocardial infarction is still suspected,     repeat the test at appropriate  intervals.  URINALYSIS, ROUTINE W REFLEX MICROSCOPIC     Status: None   Collection Time    05/25/14 10:27 PM      Result Value Ref Range   Color, Urine YELLOW  YELLOW   APPearance CLEAR  CLEAR   Specific Gravity, Urine 1.007  1.005 - 1.030   pH 5.5  5.0 - 8.0   Glucose, UA NEGATIVE  NEGATIVE mg/dL   Hgb urine dipstick NEGATIVE  NEGATIVE   Bilirubin Urine NEGATIVE  NEGATIVE   Ketones, ur NEGATIVE  NEGATIVE mg/dL   Protein, ur NEGATIVE  NEGATIVE mg/dL   Urobilinogen, UA 0.2  0.0 - 1.0 mg/dL   Nitrite NEGATIVE  NEGATIVE   Leukocytes, UA NEGATIVE  NEGATIVE   Comment: MICROSCOPIC NOT DONE ON URINES WITH NEGATIVE PROTEIN, BLOOD, LEUKOCYTES, NITRITE, OR GLUCOSE <1000 mg/dL.  CBC WITH DIFFERENTIAL     Status: Abnormal   Collection Time    05/26/14 12:00 PM      Result Value Ref Range   WBC 6.7  4.0 - 10.5 K/uL   RBC 4.74  4.22 - 5.81 MIL/uL   Hemoglobin 15.3  13.0 - 17.0 g/dL   HCT 44.8  39.0 - 52.0 %   MCV 94.5  78.0 - 100.0 fL   MCH 32.3  26.0 - 34.0 pg   MCHC 34.2  30.0 - 36.0 g/dL   RDW 12.7  11.5 - 15.5 %   Platelets 274  150 - 400 K/uL   Neutrophils Relative % 53  43 - 77 %   Neutro Abs 3.5  1.7 - 7.7 K/uL   Lymphocytes Relative 29  12 - 46 %   Lymphs Abs 2.0  0.7 - 4.0 K/uL   Monocytes Relative 10  3 - 12 %   Monocytes Absolute 0.7  0.1 - 1.0 K/uL    Eosinophils Relative 7 (*) 0 - 5 %   Eosinophils Absolute 0.5  0.0 - 0.7 K/uL   Basophils Relative 1  0 - 1 %   Basophils Absolute 0.1  0.0 - 0.1 K/uL  COMPREHENSIVE METABOLIC PANEL     Status: Abnormal   Collection Time    05/26/14 12:00 PM      Result Value Ref Range   Sodium 139  137 - 147 mEq/L   Potassium 4.3  3.7 - 5.3 mEq/L   Chloride 101  96 - 112 mEq/L   CO2 24  19 - 32 mEq/L   Glucose, Bld 108 (*) 70 - 99 mg/dL   BUN 15  6 - 23 mg/dL   Creatinine, Ser 1.20  0.50 - 1.35 mg/dL   Calcium 10.1  8.4 - 10.5 mg/dL   Total Protein 7.9  6.0 - 8.3 g/dL   Albumin 4.4  3.5 - 5.2 g/dL   AST 31  0 - 37 U/L   ALT 35  0 - 53 U/L   Alkaline Phosphatase 68  39 - 117 U/L   Total Bilirubin 0.6  0.3 - 1.2 mg/dL   GFR calc non Af Amer 62 (*) >90 mL/min   GFR calc Af Amer 72 (*) >90 mL/min   Comment: (NOTE)     The eGFR has been calculated using the CKD EPI equation.     This calculation has not been validated in all clinical situations.     eGFR's persistently <90 mL/min signify possible Chronic Kidney     Disease.   Anion gap 14  5 - 15  TROPONIN I  Status: None   Collection Time    05/26/14 12:00 PM      Result Value Ref Range   Troponin I <0.30  <0.30 ng/mL   Comment:            Due to the release kinetics of cTnI,     a negative result within the first hours     of the onset of symptoms does not rule out     myocardial infarction with certainty.     If myocardial infarction is still suspected,     repeat the test at appropriate intervals.  URINE CULTURE     Status: None   Collection Time    05/26/14  1:30 PM      Result Value Ref Range   Specimen Description URINE, CATHETERIZED     Special Requests NONE     Culture  Setup Time       Value: 05/27/2014 14:16     Performed at [facility]       Value: NO GROWTH     Performed at [facility]   Culture       Value: NO GROWTH     Performed at [facility]   Report Status 05/28/2014  FINAL      Assessment/Plan: Unspecified constipation Has resolved itself now that there is good urinary output and distended bladder is not impeding stool outlet.  Continue fiber supplementation and probiotic.  Prostate cancer Patient with upcoming Prostatectomy pending cardiology clearance for surgery.  Continue Flomax to help with voiding.  Chest pain on breathing Workup thus far unremarkable.  Patient with medical history significant for asthma and COPD.  Patient well-controlled with current medication regimen.  Patient has upcoming myoview to help r/o cardiac cause of symptoms.  Likely also an anxiety component to symptoms.  Will increase Xanax to 1 mg TID.  Diverticulum of bladder Noted on CT.  Good urinary output with Flomax and foley catheter.  Patient has upcoming appointment with Urology.

## 2014-06-03 NOTE — Progress Notes (Signed)
Pre visit review using our clinic review tool, if applicable. No additional management support is needed unless otherwise documented below in the visit note/SLS  

## 2014-06-03 NOTE — Patient Instructions (Signed)
Please continue medication as directed.  Increase Xanax to 1 mg twice daily.  Can increase to three times daily as needed.  Stay well hydrated.  Follow-up with Urology as scheduled.  Please follow-up for the Nuclear Medicine Test with Dr. Mare Ferrari.  If chest pain recurs and is not relieved with nitroglycerin, please call 911.     Chest Pain (Nonspecific) It is often hard to give a specific diagnosis for the cause of chest pain. There is always a chance that your pain could be related to something serious, such as a heart attack or a blood clot in the lungs. You need to follow up with your health care provider for further evaluation. CAUSES   Heartburn.  Pneumonia or bronchitis.  Anxiety or stress.  Inflammation around your heart (pericarditis) or lung (pleuritis or pleurisy).  A blood clot in the lung.  A collapsed lung (pneumothorax). It can develop suddenly on its own (spontaneous pneumothorax) or from trauma to the chest.  Shingles infection (herpes zoster virus). The chest wall is composed of bones, muscles, and cartilage. Any of these can be the source of the pain.  The bones can be bruised by injury.  The muscles or cartilage can be strained by coughing or overwork.  The cartilage can be affected by inflammation and become sore (costochondritis). DIAGNOSIS  Lab tests or other studies may be needed to find the cause of your pain. Your health care provider may have you take a test called an ambulatory electrocardiogram (ECG). An ECG records your heartbeat patterns over a 24-hour period. You may also have other tests, such as:  Transthoracic echocardiogram (TTE). During echocardiography, sound waves are used to evaluate how blood flows through your heart.  Transesophageal echocardiogram (TEE).  Cardiac monitoring. This allows your health care provider to monitor your heart rate and rhythm in real time.  Holter monitor. This is a portable device that records your heartbeat and  can help diagnose heart arrhythmias. It allows your health care provider to track your heart activity for several days, if needed.  Stress tests by exercise or by giving medicine that makes the heart beat faster. TREATMENT   Treatment depends on what may be causing your chest pain. Treatment may include:  Acid blockers for heartburn.  Anti-inflammatory medicine.  Pain medicine for inflammatory conditions.  Antibiotics if an infection is present.  You may be advised to change lifestyle habits. This includes stopping smoking and avoiding alcohol, caffeine, and chocolate.  You may be advised to keep your head raised (elevated) when sleeping. This reduces the chance of acid going backward from your stomach into your esophagus. Most of the time, nonspecific chest pain will improve within 2-3 days with rest and mild pain medicine.  HOME CARE INSTRUCTIONS   If antibiotics were prescribed, take them as directed. Finish them even if you start to feel better.  For the next few days, avoid physical activities that bring on chest pain. Continue physical activities as directed.  Do not use any tobacco products, including cigarettes, chewing tobacco, or electronic cigarettes.  Avoid drinking alcohol.  Only take medicine as directed by your health care provider.  Follow your health care provider's suggestions for further testing if your chest pain does not go away.  Keep any follow-up appointments you made. If you do not go to an appointment, you could develop lasting (chronic) problems with pain. If there is any problem keeping an appointment, call to reschedule. SEEK MEDICAL CARE IF:   Your chest pain  does not go away, even after treatment.  You have a rash with blisters on your chest.  You have a fever. SEEK IMMEDIATE MEDICAL CARE IF:   You have increased chest pain or pain that spreads to your arm, neck, jaw, back, or abdomen.  You have shortness of breath.  You have an increasing  cough, or you cough up blood.  You have severe back or abdominal pain.  You feel nauseous or vomit.  You have severe weakness.  You faint.  You have chills. This is an emergency. Do not wait to see if the pain will go away. Get medical help at once. Call your local emergency services (911 in [place] Do not drive yourself to the hospital. MAKE SURE YOU:   Understand these instructions.  Will watch your condition.  Will get help right away if you are not doing well or get worse. Document Released: 07/11/2005 Document Revised: 10/06/2013 Document Reviewed: 05/06/2008 [facility] Patient Information 2015 [place], [place]. This information is not intended to replace advice given to you by your health care provider. Make sure you discuss any questions you have with your health care provider.

## 2014-06-04 ENCOUNTER — Telehealth: Payer: Self-pay | Admitting: *Deleted

## 2014-06-04 ENCOUNTER — Telehealth: Payer: Self-pay

## 2014-06-04 MED ORDER — FLUTICASONE FUROATE-VILANTEROL 100-25 MCG/INH IN AEPB: INHALATION_SPRAY | RESPIRATORY_TRACT | Status: DC

## 2014-06-04 MED ORDER — ALBUTEROL SULFATE HFA 108 (90 BASE) MCG/ACT IN AERS: 1.0000 | INHALATION_SPRAY | Freq: Four times a day (QID) | RESPIRATORY_TRACT | Status: DC | PRN

## 2014-06-04 NOTE — Telephone Encounter (Signed)
90-day request from pharmacy; Rx request to pharmacy/[facility]

## 2014-06-04 NOTE — Telephone Encounter (Signed)
[name] [employer]) stated that "he received request from [facility] for 3 mth supply on Ventolin and Albuterol in [place] and he wanted to know if we received it."

## 2014-06-04 NOTE — Telephone Encounter (Signed)
We have received this and are sending Rx to pharmacy.  Thank you.

## 2014-06-08 ENCOUNTER — Ambulatory Visit (HOSPITAL_COMMUNITY): Payer: Medicare HMO | Attending: Cardiology | Admitting: Radiology

## 2014-06-08 VITALS — BP 146/80 | HR 79 | Ht 73.0 in | Wt 208.0 lb

## 2014-06-08 DIAGNOSIS — R079 Chest pain, unspecified: Secondary | ICD-10-CM | POA: Diagnosis not present

## 2014-06-08 DIAGNOSIS — R0600 Dyspnea, unspecified: Secondary | ICD-10-CM

## 2014-06-08 DIAGNOSIS — R0609 Other forms of dyspnea: Secondary | ICD-10-CM | POA: Insufficient documentation

## 2014-06-08 DIAGNOSIS — R0789 Other chest pain: Secondary | ICD-10-CM

## 2014-06-08 DIAGNOSIS — Z8249 Family history of ischemic heart disease and other diseases of the circulatory system: Secondary | ICD-10-CM | POA: Diagnosis not present

## 2014-06-08 DIAGNOSIS — R0602 Shortness of breath: Secondary | ICD-10-CM

## 2014-06-08 DIAGNOSIS — J449 Chronic obstructive pulmonary disease, unspecified: Secondary | ICD-10-CM | POA: Diagnosis not present

## 2014-06-08 DIAGNOSIS — R002 Palpitations: Secondary | ICD-10-CM | POA: Insufficient documentation

## 2014-06-08 DIAGNOSIS — J4489 Other specified chronic obstructive pulmonary disease: Secondary | ICD-10-CM | POA: Insufficient documentation

## 2014-06-08 DIAGNOSIS — R0989 Other specified symptoms and signs involving the circulatory and respiratory systems: Secondary | ICD-10-CM | POA: Insufficient documentation

## 2014-06-08 MED ORDER — TECHNETIUM TC 99M SESTAMIBI GENERIC - CARDIOLITE
33.0000 | Freq: Once | INTRAVENOUS | Status: AC | PRN
  Administered 2014-06-08: 33 via INTRAVENOUS

## 2014-06-08 MED ORDER — REGADENOSON 0.4 MG/5ML IV SOLN
0.4000 mg | Freq: Once | INTRAVENOUS | Status: AC
  Administered 2014-06-08: 0.4 mg via INTRAVENOUS

## 2014-06-08 MED ORDER — TECHNETIUM TC 99M SESTAMIBI GENERIC - CARDIOLITE
11.0000 | Freq: Once | INTRAVENOUS | Status: AC | PRN
  Administered 2014-06-08: 11 via INTRAVENOUS

## 2014-06-08 NOTE — Progress Notes (Signed)
[facility] [address]ITE [address] NUCLEAR MED [address] [place], [place] [address] [phone]    Cardiology Nuclear Med Study  [name] is a 63 y.o. male     MRN : [id number]     DOB: 1951-08-13  Procedure Date: 06/08/2014  Nuclear Med Background Indication for Stress Test:  Evaluation for Ischemia, Patient seen in hospital on 05-24-2014 for Chest Pain,  Enzymes negative, and Pending possible Surgical Clearance for  Prostate Surgery   History:  No known CAD, frequent PAC's, COPD Cardiac Risk Factors: Family History - CAD, History of Smoking and Hypertension  Symptoms:  Chest Pain, DOE, Palpitations and SOB   Nuclear Pre-Procedure Caffeine/Decaff Intake:  None> 12 hrs NPO After: 9:30pm   Lungs:  clear O2 Sat: 96% on room air. IV 0.9% NS with Angio Cath:  22g  IV Site: R Antecubital x 1, tolerated well IV Started by:  Irven Baltimore, RN  Chest Size (in):  44 Cup Size: n/a  Height: 6\' 1"  (1.854 m)  Weight:  208 lb (94.348 kg)  BMI:  Body mass index is 27.45 kg/(m^2). Tech Comments:  Patient did Nebulizer treatment at home at 0730 today. Irven Baltimore, RN.    Nuclear Med Study 1 or 2 day study: 1 day  Stress Test Type:  Carlton Adam  Reading MD: N/A  Order Authorizing Provider:  Darlin Coco, MD  Resting Radionuclide: Technetium 56m Sestamibi  Resting Radionuclide Dose: 11.0 mCi   Stress Radionuclide:  Technetium 46m Sestamibi  Stress Radionuclide Dose: 33.0 mCi           Stress Protocol Rest HR: 79 Stress HR: 111  Rest BP: 146/80 Stress BP: 131/59  Exercise Time (min): n/a METS: n/a           Dose of Adenosine (mg):  n/a Dose of Lexiscan: 0.4 mg  Dose of Atropine (mg): n/a Dose of Dobutamine: n/a mcg/kg/min (at max HR)  Stress Test Technologist: Glade Lloyd, BS-ES  Nuclear Technologist:  Annye Rusk, CNMT     Rest Procedure:  Myocardial perfusion imaging was performed at rest 45 minutes following the intravenous administration of Technetium 58m  Sestamibi. Rest ECG: [id number]  Stress Procedure:  The patient received IV Lexiscan 0.4 mg over 15-seconds.  Technetium 74m Sestamibi injected at 30-seconds.  Quantitative spect images were obtained after a 45 minute delay.  During the infusion of Lexiscan the patient complained of SOB, chest tightness and nausea.  These symptoms began to resolve in recovery.  Stress ECG: No significant change from baseline ECG  QPS Raw Data Images:  Normal; no motion artifact; normal heart/lung ratio. Stress Images:  Normal homogeneous uptake in all areas of the myocardium. Rest Images:  Normal homogeneous uptake in all areas of the myocardium. Subtraction (SDS):  Normal Transient Ischemic Dilatation (Normal <1.22):  1.14 Lung/Heart Ratio (Normal <0.45):  0.30  Quantitative Gated Spect Images QGS EDV:  n/a QGS ESV:  n/a  Impression Exercise Capacity:  Lexiscan with no exercise. BP Response:  Normal blood pressure response. Clinical Symptoms:  Chest pain and dyspnea ECG Impression:  No significant ST segment change suggestive of ischemia. Comparison with Prior Nuclear Study: No images to compare  Overall Impression:  Normal stress nuclear study.  Gut uptake/artifact Not gated due to PAC;s   LV Ejection Fraction: Study not gated.  LV Wall Motion:  Study not gated  [employer]

## 2014-06-09 ENCOUNTER — Telehealth: Payer: Self-pay | Admitting: *Deleted

## 2014-06-09 NOTE — Telephone Encounter (Signed)
Message copied by Earvin Hansen on Wed Jun 09, 2014  1:28 PM ------      Message from: Darlin Coco      Created: Wed Jun 09, 2014 12:42 PM       Please report.  The stress test was normal.  He is cleared from a cardiology standpoint to proceed with his other surgical procedures. ------

## 2014-06-09 NOTE — Telephone Encounter (Signed)
Advised patient

## 2014-06-17 DIAGNOSIS — N323 Diverticulum of bladder: Secondary | ICD-10-CM | POA: Insufficient documentation

## 2014-06-17 NOTE — Assessment & Plan Note (Signed)
Workup thus far unremarkable.  Patient with medical history significant for asthma and COPD.  Patient well-controlled with current medication regimen.  Patient has upcoming myoview to help r/o cardiac cause of symptoms.  Likely also an anxiety component to symptoms.  Will increase Xanax to 1 mg TID.

## 2014-06-17 NOTE — Assessment & Plan Note (Signed)
Patient with upcoming Prostatectomy pending cardiology clearance for surgery.  Continue Flomax to help with voiding.

## 2014-06-17 NOTE — Assessment & Plan Note (Signed)
Noted on CT.  Good urinary output with Flomax and foley catheter.  Patient has upcoming appointment with Urology.

## 2014-06-17 NOTE — Assessment & Plan Note (Signed)
Has resolved itself now that there is good urinary output and distended bladder is not impeding stool outlet.  Continue fiber supplementation and probiotic.

## 2014-06-23 ENCOUNTER — Ambulatory Visit: Payer: Self-pay | Admitting: Physician Assistant

## 2014-06-25 ENCOUNTER — Encounter: Payer: Self-pay | Admitting: Physician Assistant

## 2014-06-25 ENCOUNTER — Ambulatory Visit (INDEPENDENT_AMBULATORY_CARE_PROVIDER_SITE_OTHER): Payer: Medicare HMO | Admitting: Physician Assistant

## 2014-06-25 VITALS — BP 120/75 | HR 85 | Temp 98.2°F | Wt 214.0 lb

## 2014-06-25 DIAGNOSIS — K5900 Constipation, unspecified: Secondary | ICD-10-CM

## 2014-06-25 DIAGNOSIS — C61 Malignant neoplasm of prostate: Secondary | ICD-10-CM

## 2014-06-25 DIAGNOSIS — J019 Acute sinusitis, unspecified: Secondary | ICD-10-CM

## 2014-06-25 DIAGNOSIS — M25569 Pain in unspecified knee: Secondary | ICD-10-CM

## 2014-06-25 DIAGNOSIS — J42 Unspecified chronic bronchitis: Secondary | ICD-10-CM

## 2014-06-25 DIAGNOSIS — G894 Chronic pain syndrome: Secondary | ICD-10-CM

## 2014-06-25 DIAGNOSIS — B9689 Other specified bacterial agents as the cause of diseases classified elsewhere: Secondary | ICD-10-CM

## 2014-06-25 DIAGNOSIS — G8929 Other chronic pain: Secondary | ICD-10-CM

## 2014-06-25 MED ORDER — HYDROCODONE-ACETAMINOPHEN 10-325 MG PO TABS: 1.0000 | ORAL_TABLET | Freq: Three times a day (TID) | ORAL | Status: DC | PRN

## 2014-06-25 MED ORDER — AZITHROMYCIN 250 MG PO TABS: ORAL_TABLET | ORAL | Status: DC

## 2014-06-25 NOTE — Progress Notes (Signed)
Patient presents to clinic today c/o sinus pressure, sinus pain, increased cough and congestion x several days.  Endorses some mild ear pain.  Denies SOB or pleuritic chest pain. Is taking Asthma and COPD medications as directed.   Patient also requesting referral to a new Urologist due to not liking his current specialist.  Patient with prostatic carcinoma in need of surgical intervention.  Also with bouts of urinary retention and anuria.  Currently on Flomax.  Endorses good urinary output.   Patient requesting referral to new Pulmonologist in [facility] due to transportation difficulties.    Patient also with chronic knee pain, previously followed by Orthopedic Surgery.  Refused surgical intervention.  Wishes to discuss joint injection.  Is aware we do not do this in our Family Medicine clinic.  Past Medical History  Diagnosis Date  . COPD (chronic obstructive pulmonary disease)   . Hypertension   . Polio   . Bronchitis   . DJD (degenerative joint disease) of hip   . Angina pectoris   . Tuberculosis   . Prostate cancer   . Chicken pox   . Rheumatic fever   . Measles   . Mumps   . GERD (gastroesophageal reflux disease)   . Alcoholism     Current Outpatient Prescriptions on File Prior to Visit  Medication Sig Dispense Refill  . albuterol (PROVENTIL HFA;VENTOLIN HFA) 108 (90 BASE) MCG/ACT inhaler Inhale 1-2 puffs into the lungs every 6 (six) hours as needed for wheezing or shortness of breath. PROAIR  3 Inhaler  1  . albuterol (PROVENTIL) (2.5 MG/3ML) 0.083% nebulizer solution Take 2.5 mg by nebulization every 6 (six) hours as needed for wheezing or shortness of breath.      . ALPRAZolam (XANAX) 1 MG tablet Take 1 tablet (1 mg total) by mouth 3 (three) times daily as needed for anxiety.  90 tablet  0  . amLODipine (NORVASC) 10 MG tablet TAKE 1 TABLET (10 MG TOTAL) BY MOUTH DAILY.  90 tablet  0  . EPINEPHrine (EPIPEN) 0.3 mg/0.3 mL IJ SOAJ injection Inject 0.3 mLs (0.3 mg total) into  the muscle once.  1 Device  2  . famotidine (PEPCID) 20 MG tablet Take 1 tablet (20 mg total) by mouth 2 (two) times daily.  60 tablet  1  . Fluticasone Furoate-Vilanterol (BREO ELLIPTA) 100-25 MCG/INH AEPB INHALE 1 PUFF DAILY AND RINSE MOUTH AFTER USE.  180 each  1  . loratadine (CLARITIN) 10 MG tablet TAKE 1 TABLET BY MOUTH EVERY DAY  30 tablet  0  . losartan (COZAAR) 50 MG tablet TAKE 1 TABLET (50 MG TOTAL) BY MOUTH DAILY.  90 tablet  0  . montelukast (SINGULAIR) 10 MG tablet Take 10 mg by mouth at bedtime.      . nitroGLYCERIN (NITROSTAT) 0.4 MG SL tablet Place 0.4 mg under the tongue every 5 (five) minutes as needed. For chest pain      . ondansetron (ZOFRAN) 4 MG tablet Take 1 tablet (4 mg total) by mouth every 6 (six) hours.  12 tablet  0  . polyethylene glycol powder (MIRALAX) powder Take 17 g by mouth daily.  255 g  0  . tamsulosin (FLOMAX) 0.4 MG CAPS capsule Take 1 capsule (0.4 mg total) by mouth daily.  20 capsule  0   No current facility-administered medications on file prior to visit.    Allergies  Allergen Reactions  . Ivp Dye [Iodinated Diagnostic Agents] Anaphylaxis  . Penicillins Anaphylaxis    Heart  stops  . Aspirin Other (See Comments)    Reaction unknown  . Morphine And Related Nausea And Vomiting    Family History  Problem Relation Age of Onset  . Alzheimer's disease Father 4    Deceased  . Stomach cancer Father   . Heart attack Father   . Heart disease Father   . Skin cancer Mother     Facial-Living  . Alcohol abuse Sister     x2  . Mental illness Sister     x2  . Diabetes Maternal Aunt     x2  . Thyroid disease Maternal Aunt     x4  . Diabetes Maternal Uncle   . Tuberculosis Paternal Grandfather   . Tuberculosis Paternal Grandmother   . Alzheimer's disease Paternal Aunt   . Alzheimer's disease Paternal Uncle     History   Social History  . Marital Status: Married    Spouse Name: N/A    Number of Children: N/A  . Years of Education: N/A    Social History Main Topics  . Smoking status: Former Smoker -- 2 years    Types: Pipe    Quit date: 08/15/2013  . Smokeless tobacco: None  . Alcohol Use: Yes  . Drug Use: No  . Sexual Activity: None   Other Topics Concern  . None   Social History Narrative  . None   Review of Systems - See HPI.  All other ROS are negative.  BP 120/75  Pulse 85  Temp(Src) 98.2 F (36.8 C)  Wt 214 lb (97.07 kg)  SpO2 95%  Physical Exam  Vitals reviewed. Constitutional: He is oriented to person, place, and time and well-developed, well-nourished, and in no distress.  HENT:  Head: Normocephalic and atraumatic.  Right Ear: External ear normal.  Left Ear: External ear normal.  Nose: Nose normal.  Mouth/Throat: Oropharynx is clear and moist. No oropharyngeal exudate.  TM within normal limits bilaterally. + TTP of sinuses noted on examination.  Eyes: Conjunctivae are normal.  Neck: Neck supple.  Cardiovascular: Normal rate, regular rhythm, normal heart sounds and intact distal pulses.   Pulmonary/Chest: Effort normal and breath sounds normal. No respiratory distress. He has no wheezes. He has no rales. He exhibits no tenderness.  Lymphadenopathy:    He has no cervical adenopathy.  Neurological: He is alert and oriented to person, place, and time.  Skin: Skin is warm and dry. No rash noted.  Psychiatric: Affect normal.    Recent Results (from the past 2160 hour(s))  HEMOGLOBIN A1C     Status: Abnormal   Collection Time    04/05/14 11:45 AM      Result Value Ref Range   Hemoglobin A1C 6.0 (*) <5.7 %   Comment:                                                                            According to the ADA Clinical Practice Recommendations for 2011, when     HbA1c is used as a screening test:             >=6.5%   Diagnostic of Diabetes Mellitus                (  if abnormal result is confirmed)           5.7-6.4%   Increased risk of developing Diabetes Mellitus            References:Diagnosis and Classification of Diabetes Mellitus,Diabetes     PJAS,5053,97(QBHAL 1):S62-S69 and Standards of Medical Care in             Diabetes - 2011,Diabetes PFXT,0240,97 (Suppl 1):S11-S61.         Mean Plasma Glucose 126 (*) <117 mg/dL  URINALYSIS, ROUTINE W REFLEX MICROSCOPIC     Status: None   Collection Time    04/06/14  1:26 AM      Result Value Ref Range   Color, Urine YELLOW  YELLOW   APPearance CLEAR  CLEAR   Specific Gravity, Urine 1.015  1.005 - 1.030   pH 5.5  5.0 - 8.0   Glucose, UA NEGATIVE  NEGATIVE mg/dL   Hgb urine dipstick NEGATIVE  NEGATIVE   Bilirubin Urine NEGATIVE  NEGATIVE   Ketones, ur NEGATIVE  NEGATIVE mg/dL   Protein, ur NEGATIVE  NEGATIVE mg/dL   Urobilinogen, UA 0.2  0.0 - 1.0 mg/dL   Nitrite NEGATIVE  NEGATIVE   Leukocytes, UA NEGATIVE  NEGATIVE   Comment: MICROSCOPIC NOT DONE ON URINES WITH NEGATIVE PROTEIN, BLOOD, LEUKOCYTES, NITRITE, OR GLUCOSE <1000 mg/dL.  CBC WITH DIFFERENTIAL     Status: None   Collection Time    04/06/14  2:16 AM      Result Value Ref Range   WBC 6.8  4.0 - 10.5 K/uL   RBC 4.35  4.22 - 5.81 MIL/uL   Hemoglobin 14.4  13.0 - 17.0 g/dL   HCT 41.2  39.0 - 52.0 %   MCV 94.7  78.0 - 100.0 fL   MCH 33.1  26.0 - 34.0 pg   MCHC 35.0  30.0 - 36.0 g/dL   RDW 12.7  11.5 - 15.5 %   Platelets 285  150 - 400 K/uL   Neutrophils Relative % 64  43 - 77 %   Neutro Abs 4.3  1.7 - 7.7 K/uL   Lymphocytes Relative 19  12 - 46 %   Lymphs Abs 1.3  0.7 - 4.0 K/uL   Monocytes Relative 11  3 - 12 %   Monocytes Absolute 0.7  0.1 - 1.0 K/uL   Eosinophils Relative 5  0 - 5 %   Eosinophils Absolute 0.3  0.0 - 0.7 K/uL   Basophils Relative 1  0 - 1 %   Basophils Absolute 0.1  0.0 - 0.1 K/uL  COMPREHENSIVE METABOLIC PANEL     Status: Abnormal   Collection Time    04/06/14  2:16 AM      Result Value Ref Range   Sodium 134 (*) 137 - 147 mEq/L   Potassium 4.2  3.7 - 5.3 mEq/L   Chloride 97  96 - 112 mEq/L   CO2 23  19 - 32 mEq/L    Glucose, Bld 138 (*) 70 - 99 mg/dL   BUN 18  6 - 23 mg/dL   Creatinine, Ser 1.30  0.50 - 1.35 mg/dL   Calcium 9.8  8.4 - 10.5 mg/dL   Total Protein 7.4  6.0 - 8.3 g/dL   Albumin 4.2  3.5 - 5.2 g/dL   AST 24  0 - 37 U/L   ALT 28  0 - 53 U/L   Alkaline Phosphatase 69  39 - 117 U/L   Total Bilirubin 0.5  0.3 -  1.2 mg/dL   GFR calc non Af Amer 56 (*) >90 mL/min   GFR calc Af Amer 65 (*) >90 mL/min   Comment: (NOTE)     The eGFR has been calculated using the CKD EPI equation.     This calculation has not been validated in all clinical situations.     eGFR's persistently <90 mL/min signify possible Chronic Kidney     Disease.  LIPASE, BLOOD     Status: None   Collection Time    04/06/14  2:16 AM      Result Value Ref Range   Lipase 45  11 - 59 U/L  CBC WITH DIFFERENTIAL     Status: None   Collection Time    05/15/14  2:10 PM      Result Value Ref Range   WBC 6.1  4.0 - 10.5 K/uL   RBC 4.44  4.22 - 5.81 MIL/uL   Hemoglobin 14.3  13.0 - 17.0 g/dL   HCT 41.7  39.0 - 52.0 %   MCV 93.9  78.0 - 100.0 fL   MCH 32.2  26.0 - 34.0 pg   MCHC 34.3  30.0 - 36.0 g/dL   RDW 12.4  11.5 - 15.5 %   Platelets 236  150 - 400 K/uL   Neutrophils Relative % 64  43 - 77 %   Neutro Abs 3.9  1.7 - 7.7 K/uL   Lymphocytes Relative 23  12 - 46 %   Lymphs Abs 1.4  0.7 - 4.0 K/uL   Monocytes Relative 8  3 - 12 %   Monocytes Absolute 0.5  0.1 - 1.0 K/uL   Eosinophils Relative 4  0 - 5 %   Eosinophils Absolute 0.3  0.0 - 0.7 K/uL   Basophils Relative 1  0 - 1 %   Basophils Absolute 0.0  0.0 - 0.1 K/uL  BASIC METABOLIC PANEL     Status: Abnormal   Collection Time    05/15/14  2:10 PM      Result Value Ref Range   Sodium 139  137 - 147 mEq/L   Potassium 4.6  3.7 - 5.3 mEq/L   Chloride 100  96 - 112 mEq/L   CO2 26  19 - 32 mEq/L   Glucose, Bld 123 (*) 70 - 99 mg/dL   BUN 13  6 - 23 mg/dL   Creatinine, Ser 1.20  0.50 - 1.35 mg/dL   Calcium 9.7  8.4 - 10.5 mg/dL   GFR calc non Af Amer 62 (*) >90 mL/min    GFR calc Af Amer 72 (*) >90 mL/min   Comment: (NOTE)     The eGFR has been calculated using the CKD EPI equation.     This calculation has not been validated in all clinical situations.     eGFR's persistently <90 mL/min signify possible Chronic Kidney     Disease.   Anion gap 13  5 - 15  TROPONIN I     Status: None   Collection Time    05/15/14  2:10 PM      Result Value Ref Range   Troponin I <0.30  <0.30 ng/mL   Comment:            Due to the release kinetics of cTnI,     a negative result within the first hours     of the onset of symptoms does not rule out     myocardial infarction with certainty.  If myocardial infarction is still suspected,     repeat the test at appropriate intervals.  URINALYSIS, ROUTINE W REFLEX MICROSCOPIC     Status: None   Collection Time    05/25/14 10:27 PM      Result Value Ref Range   Color, Urine YELLOW  YELLOW   APPearance CLEAR  CLEAR   Specific Gravity, Urine 1.007  1.005 - 1.030   pH 5.5  5.0 - 8.0   Glucose, UA NEGATIVE  NEGATIVE mg/dL   Hgb urine dipstick NEGATIVE  NEGATIVE   Bilirubin Urine NEGATIVE  NEGATIVE   Ketones, ur NEGATIVE  NEGATIVE mg/dL   Protein, ur NEGATIVE  NEGATIVE mg/dL   Urobilinogen, UA 0.2  0.0 - 1.0 mg/dL   Nitrite NEGATIVE  NEGATIVE   Leukocytes, UA NEGATIVE  NEGATIVE   Comment: MICROSCOPIC NOT DONE ON URINES WITH NEGATIVE PROTEIN, BLOOD, LEUKOCYTES, NITRITE, OR GLUCOSE <1000 mg/dL.  CBC WITH DIFFERENTIAL     Status: Abnormal   Collection Time    05/26/14 12:00 PM      Result Value Ref Range   WBC 6.7  4.0 - 10.5 K/uL   RBC 4.74  4.22 - 5.81 MIL/uL   Hemoglobin 15.3  13.0 - 17.0 g/dL   HCT 44.8  39.0 - 52.0 %   MCV 94.5  78.0 - 100.0 fL   MCH 32.3  26.0 - 34.0 pg   MCHC 34.2  30.0 - 36.0 g/dL   RDW 12.7  11.5 - 15.5 %   Platelets 274  150 - 400 K/uL   Neutrophils Relative % 53  43 - 77 %   Neutro Abs 3.5  1.7 - 7.7 K/uL   Lymphocytes Relative 29  12 - 46 %   Lymphs Abs 2.0  0.7 - 4.0 K/uL    Monocytes Relative 10  3 - 12 %   Monocytes Absolute 0.7  0.1 - 1.0 K/uL   Eosinophils Relative 7 (*) 0 - 5 %   Eosinophils Absolute 0.5  0.0 - 0.7 K/uL   Basophils Relative 1  0 - 1 %   Basophils Absolute 0.1  0.0 - 0.1 K/uL  COMPREHENSIVE METABOLIC PANEL     Status: Abnormal   Collection Time    05/26/14 12:00 PM      Result Value Ref Range   Sodium 139  137 - 147 mEq/L   Potassium 4.3  3.7 - 5.3 mEq/L   Chloride 101  96 - 112 mEq/L   CO2 24  19 - 32 mEq/L   Glucose, Bld 108 (*) 70 - 99 mg/dL   BUN 15  6 - 23 mg/dL   Creatinine, Ser 1.20  0.50 - 1.35 mg/dL   Calcium 10.1  8.4 - 10.5 mg/dL   Total Protein 7.9  6.0 - 8.3 g/dL   Albumin 4.4  3.5 - 5.2 g/dL   AST 31  0 - 37 U/L   ALT 35  0 - 53 U/L   Alkaline Phosphatase 68  39 - 117 U/L   Total Bilirubin 0.6  0.3 - 1.2 mg/dL   GFR calc non Af Amer 62 (*) >90 mL/min   GFR calc Af Amer 72 (*) >90 mL/min   Comment: (NOTE)     The eGFR has been calculated using the CKD EPI equation.     This calculation has not been validated in all clinical situations.     eGFR's persistently <90 mL/min signify possible Chronic Kidney     Disease.  Anion gap 14  5 - 15  TROPONIN I     Status: None   Collection Time    05/26/14 12:00 PM      Result Value Ref Range   Troponin I <0.30  <0.30 ng/mL   Comment:            Due to the release kinetics of cTnI,     a negative result within the first hours     of the onset of symptoms does not rule out     myocardial infarction with certainty.     If myocardial infarction is still suspected,     repeat the test at appropriate intervals.  URINE CULTURE     Status: None   Collection Time    05/26/14  1:30 PM      Result Value Ref Range   Specimen Description URINE, CATHETERIZED     Special Requests NONE     Culture  Setup Time       Value: 05/27/2014 14:16     Performed at [place]       Value: NO GROWTH     Performed at [facility]   Culture       Value:  NO GROWTH     Performed at [facility]   Report Status 05/28/2014 FINAL      Assessment/Plan: Chronic knee pain Bilateral.  With some response to narcotic pain medication.  However making patient constipated.  Will refer to Sports Medicine for evaluation for joint injection as patient refuses surgery.  Prostate cancer This will be the patient's 3rd referral to Urology.  Patient is aware that this is the last referral we will place.  He will have to select a specialist and follow-through with treatment plan.  Cancer is nothing to play around with. Also requested patient bring/send in records from current Urologist. Continue Flomax daily.   Unspecified constipation Continue warm prune juice and milk of magnesia. Limit use of narcotic pain medication.  Patient with history of bladder diverticulum causing urinary retention and compression of bowel leading to obstipation.  Patient to follow-up with Urologist for PVR check.  Continue Flomax daily.Patient is aware of alarm signs/symptms and when to proceed to ER.   COPD (chronic obstructive pulmonary disease) New referral to Pulmonology placed.  Acute bacterial sinusitis Rx Azithromycin.  Increase fluid intake.  Rest.  Saline nasal spray. Mucinex.  Humidifier in bedroom.

## 2014-06-25 NOTE — Progress Notes (Signed)
Pre visit review using our clinic review tool, if applicable. No additional management support is needed unless otherwise documented below in the visit note. 

## 2014-06-25 NOTE — Patient Instructions (Signed)
For sinus inflammation -- Take Azithromycin as directed.  Continue asthma/COPD medications as directed.  Increase fluid intake.  Tylenol sinus if needed.  Avoid antihistamines giving the prostate cancer until cleared by Urologist.  I will set you up with a new Urologist and Pulmonologist as requested. It is very important for you to go to these appointments.  The prostate cancer needs to be treated.  For constipation -- continue the Flomax daily to prevent bladder distention.  Fiber supplement and good fluid intake.  Use milk of magnesia to help promote bowel movement.  If you begin developing constipation and problems emptying your bladder, please return to the ER so that you can be catheterization.

## 2014-06-27 DIAGNOSIS — B9689 Other specified bacterial agents as the cause of diseases classified elsewhere: Secondary | ICD-10-CM | POA: Insufficient documentation

## 2014-06-27 DIAGNOSIS — J019 Acute sinusitis, unspecified: Secondary | ICD-10-CM

## 2014-06-27 NOTE — Assessment & Plan Note (Signed)
This will be the patient's 3rd referral to Urology.  Patient is aware that this is the last referral we will place.  He will have to select a specialist and follow-through with treatment plan.  Cancer is nothing to play around with. Also requested patient bring/send in records from current Urologist. Continue Flomax daily.

## 2014-06-27 NOTE — Assessment & Plan Note (Signed)
Bilateral.  With some response to narcotic pain medication.  However making patient constipated.  Will refer to Sports Medicine for evaluation for joint injection as patient refuses surgery.

## 2014-06-27 NOTE — Assessment & Plan Note (Signed)
Continue warm prune juice and milk of magnesia. Limit use of narcotic pain medication.  Patient with history of bladder diverticulum causing urinary retention and compression of bowel leading to obstipation.  Patient to follow-up with Urologist for PVR check.  Continue Flomax daily.Patient is aware of alarm signs/symptms and when to proceed to ER.

## 2014-06-27 NOTE — Assessment & Plan Note (Signed)
Rx Azithromycin.  Increase fluid intake.  Rest.  Saline nasal spray. Mucinex.  Humidifier in bedroom.

## 2014-06-27 NOTE — Assessment & Plan Note (Signed)
New referral to Pulmonology placed.

## 2014-06-29 ENCOUNTER — Ambulatory Visit (INDEPENDENT_AMBULATORY_CARE_PROVIDER_SITE_OTHER): Payer: Medicare HMO | Admitting: Physician Assistant

## 2014-06-29 ENCOUNTER — Encounter: Payer: Self-pay | Admitting: Physician Assistant

## 2014-06-29 ENCOUNTER — Other Ambulatory Visit (HOSPITAL_COMMUNITY)
Admission: RE | Admit: 2014-06-29 | Discharge: 2014-06-29 | Disposition: A | Discharge status: Home / Self Care | Payer: Medicare HMO | Source: Ambulatory Visit | Attending: Physician Assistant | Admitting: Physician Assistant

## 2014-06-29 ENCOUNTER — Encounter: Payer: Self-pay | Admitting: Family Medicine

## 2014-06-29 ENCOUNTER — Ambulatory Visit (INDEPENDENT_AMBULATORY_CARE_PROVIDER_SITE_OTHER): Payer: Medicare HMO | Admitting: Family Medicine

## 2014-06-29 VITALS — BP 128/77 | HR 65 | Ht 73.0 in | Wt 214.0 lb

## 2014-06-29 VITALS — BP 118/85 | HR 81 | Temp 98.5°F | Resp 16 | Ht 73.0 in | Wt 213.2 lb

## 2014-06-29 DIAGNOSIS — N390 Urinary tract infection, site not specified: Secondary | ICD-10-CM

## 2014-06-29 DIAGNOSIS — R319 Hematuria, unspecified: Secondary | ICD-10-CM | POA: Diagnosis present

## 2014-06-29 DIAGNOSIS — R82998 Other abnormal findings in urine: Secondary | ICD-10-CM

## 2014-06-29 DIAGNOSIS — M25569 Pain in unspecified knee: Secondary | ICD-10-CM

## 2014-06-29 DIAGNOSIS — M25562 Pain in left knee: Secondary | ICD-10-CM

## 2014-06-29 LAB — URINALYSIS, ROUTINE W REFLEX MICROSCOPIC
Bilirubin Urine: NEGATIVE
Hgb urine dipstick: NEGATIVE
KETONES UR: NEGATIVE
Leukocytes, UA: NEGATIVE
Nitrite: NEGATIVE
RBC / HPF: NONE SEEN (ref 0–?)
Specific Gravity, Urine: 1.015 (ref 1.000–1.030)
Total Protein, Urine: NEGATIVE
URINE GLUCOSE: NEGATIVE
UROBILINOGEN UA: 0.2 (ref 0.0–1.0)
pH: 5.5 (ref 5.0–8.0)

## 2014-06-29 MED ORDER — OXYCODONE-ACETAMINOPHEN 10-300 MG PO TABS: 1.0000 | ORAL_TABLET | Freq: Four times a day (QID) | ORAL | Status: DC | PRN

## 2014-06-29 NOTE — Progress Notes (Signed)
Patient presents to clinic today for ER follow-up.  Patient was seen and evaluated at [facility] on 06/26/14 with complaints of dysuria and constipation. Patient diagnosed with UTI and given Rx for Levaquin to take as directed.   Since discharge, patient has been doing well overall.  Is concerned about possibility of bladder cancer as he states the ER MD gave him this diagnosis even though review or records does not show this diagnosis. Patient otherwise doing well.  Denies dysuria, urgency, frequency or hematuria.  Denies abdominal pain.  Endorses good urinary output with flomax.  Has upcoming appointment with Urology.  Past Medical History  Diagnosis Date  . COPD (chronic obstructive pulmonary disease)   . Hypertension   . Polio   . Bronchitis   . Angina pectoris   . Tuberculosis 1984    Hosp 4 months rx , left early  . Prostate cancer   . Chicken pox   . Rheumatic fever   . Measles   . Mumps   . GERD (gastroesophageal reflux disease)   . Alcoholism     Current Outpatient Prescriptions on File Prior to Visit  Medication Sig Dispense Refill  . albuterol (PROVENTIL HFA;VENTOLIN HFA) 108 (90 BASE) MCG/ACT inhaler Inhale 1-2 puffs into the lungs every 6 (six) hours as needed for wheezing or shortness of breath. PROAIR  3 Inhaler  1  . albuterol (PROVENTIL) (2.5 MG/3ML) 0.083% nebulizer solution Take 2.5 mg by nebulization every 6 (six) hours as needed for wheezing or shortness of breath. Pt uses 2-3X daily      . ALPRAZolam (XANAX) 1 MG tablet Take 1 tablet (1 mg total) by mouth 3 (three) times daily as needed for anxiety.  90 tablet  0  . amLODipine (NORVASC) 10 MG tablet TAKE 1 TABLET (10 MG TOTAL) BY MOUTH DAILY.  90 tablet  0  . EPINEPHrine (EPIPEN) 0.3 mg/0.3 mL IJ SOAJ injection Inject 0.3 mLs (0.3 mg total) into the muscle once.  1 Device  2  . famotidine (PEPCID) 20 MG tablet Take 1 tablet (20 mg total) by mouth 2 (two) times daily.  60 tablet  1  .  Fluticasone Furoate-Vilanterol (BREO ELLIPTA) 100-25 MCG/INH AEPB INHALE 1 PUFF DAILY AND RINSE MOUTH AFTER USE.  180 each  1  . losartan (COZAAR) 50 MG tablet TAKE 1 TABLET (50 MG TOTAL) BY MOUTH DAILY.  90 tablet  0  . montelukast (SINGULAIR) 10 MG tablet Take 10 mg by mouth at bedtime.      . tamsulosin (FLOMAX) 0.4 MG CAPS capsule Take 0.4 mg by mouth 2 (two) times daily.        No current facility-administered medications on file prior to visit.    Allergies  Allergen Reactions  . Ivp Dye [Iodinated Diagnostic Agents] Anaphylaxis  . Penicillins Anaphylaxis    Heart stops  . Aspirin Other (See Comments)    Reaction unknown  . Morphine And Related Nausea And Vomiting  . Nsaids Other (See Comments)    Difficulty breathing    Family History  Problem Relation Age of Onset  . Alzheimer's disease Father 4    Deceased  . Stomach cancer Father   . Heart attack Father   . Heart disease Father   . Skin cancer Mother     Facial-Living  . Alcohol abuse Sister     x2  . Mental illness Sister     x2  . Diabetes Maternal Aunt     x2  .  Thyroid disease Maternal Aunt     x4  . Diabetes Maternal Uncle   . Tuberculosis Paternal Grandfather   . Tuberculosis Paternal Grandmother   . Alzheimer's disease Paternal Aunt   . Alzheimer's disease Paternal Uncle     History   Social History  . Marital Status: Married    Spouse Name: N/A    Number of Children: N/A  . Years of Education: N/A   Social History Main Topics  . Smoking status: Former Smoker -- 5.00 packs/day for 47 years    Types: Pipe, Cigarettes    Quit date: 08/15/2013  . Smokeless tobacco: Never Used     Comment: Would smoke a pack of pipe tobacco(1.5oz) every 10 days.   . Alcohol Use: Yes  . Drug Use: No  . Sexual Activity: None   Other Topics Concern  . None   Social History Narrative  . None   Review of Systems - See HPI.  All other ROS are negative.  BP 118/85  Pulse 81  Temp(Src) 98.5 F (36.9 C)  (Oral)  Resp 16  Ht _0  (1.854 m)  Wt 213 lb 4 oz (96.73 kg)  BMI 28.14 kg/m2  SpO2 98%  Physical Exam  Vitals reviewed. Constitutional: He is oriented to person, place, and time and well-developed, well-nourished, and in no distress.  HENT:  Head: Normocephalic and atraumatic.  Eyes: Conjunctivae are normal.  Neck: Neck supple.  Cardiovascular: Normal rate, regular rhythm, normal heart sounds and intact distal pulses.   Pulmonary/Chest: Effort normal and breath sounds normal. No respiratory distress. He has no wheezes. He has no rales. He exhibits no tenderness.  Neurological: He is alert and oriented to person, place, and time.  Skin: Skin is warm and dry. No rash noted.  Psychiatric: Affect normal.    Recent Results (from the past 2160 hour(s))  CBC WITH DIFFERENTIAL     Status: None   Collection Time    05/15/14  2:10 PM      Result Value Ref Range   WBC 6.1  4.0 - 10.5 K/uL   RBC 4.44  4.22 - 5.81 MIL/uL   Hemoglobin 14.3  13.0 - 17.0 g/dL   HCT 41.7  39.0 - 52.0 %   MCV 93.9  78.0 - 100.0 fL   MCH 32.2  26.0 - 34.0 pg   MCHC 34.3  30.0 - 36.0 g/dL   RDW 12.4  11.5 - 15.5 %   Platelets 236  150 - 400 K/uL   Neutrophils Relative % 64  43 - 77 %   Neutro Abs 3.9  1.7 - 7.7 K/uL   Lymphocytes Relative 23  12 - 46 %   Lymphs Abs 1.4  0.7 - 4.0 K/uL   Monocytes Relative 8  3 - 12 %   Monocytes Absolute 0.5  0.1 - 1.0 K/uL   Eosinophils Relative 4  0 - 5 %   Eosinophils Absolute 0.3  0.0 - 0.7 K/uL   Basophils Relative 1  0 - 1 %   Basophils Absolute 0.0  0.0 - 0.1 K/uL  BASIC METABOLIC PANEL     Status: Abnormal   Collection Time    05/15/14  2:10 PM      Result Value Ref Range   Sodium 139  137 - 147 mEq/L   Potassium 4.6  3.7 - 5.3 mEq/L   Chloride 100  96 - 112 mEq/L   CO2 26  19 - 32 mEq/L   Glucose,  Bld 123 (*) 70 - 99 mg/dL   BUN 13  6 - 23 mg/dL   Creatinine, Ser 1.20  0.50 - 1.35 mg/dL   Calcium 9.7  8.4 - 10.5 mg/dL   GFR calc non Af Amer 62 (*) >90  mL/min   GFR calc Af Amer 72 (*) >90 mL/min   Comment: (NOTE)     The eGFR has been calculated using the CKD EPI equation.     This calculation has not been validated in all clinical situations.     eGFR's persistently <90 mL/min signify possible Chronic Kidney     Disease.   Anion gap 13  5 - 15  TROPONIN I     Status: None   Collection Time    05/15/14  2:10 PM      Result Value Ref Range   Troponin I <0.30  <0.30 ng/mL   Comment:            Due to the release kinetics of cTnI,     a negative result within the first hours     of the onset of symptoms does not rule out     myocardial infarction with certainty.     If myocardial infarction is still suspected,     repeat the test at appropriate intervals.  URINALYSIS, ROUTINE W REFLEX MICROSCOPIC     Status: None   Collection Time    05/25/14 10:27 PM      Result Value Ref Range   Color, Urine YELLOW  YELLOW   APPearance CLEAR  CLEAR   Specific Gravity, Urine 1.007  1.005 - 1.030   pH 5.5  5.0 - 8.0   Glucose, UA NEGATIVE  NEGATIVE mg/dL   Hgb urine dipstick NEGATIVE  NEGATIVE   Bilirubin Urine NEGATIVE  NEGATIVE   Ketones, ur NEGATIVE  NEGATIVE mg/dL   Protein, ur NEGATIVE  NEGATIVE mg/dL   Urobilinogen, UA 0.2  0.0 - 1.0 mg/dL   Nitrite NEGATIVE  NEGATIVE   Leukocytes, UA NEGATIVE  NEGATIVE   Comment: MICROSCOPIC NOT DONE ON URINES WITH NEGATIVE PROTEIN, BLOOD, LEUKOCYTES, NITRITE, OR GLUCOSE <1000 mg/dL.  CBC WITH DIFFERENTIAL     Status: Abnormal   Collection Time    05/26/14 12:00 PM      Result Value Ref Range   WBC 6.7  4.0 - 10.5 K/uL   RBC 4.74  4.22 - 5.81 MIL/uL   Hemoglobin 15.3  13.0 - 17.0 g/dL   HCT 44.8  39.0 - 52.0 %   MCV 94.5  78.0 - 100.0 fL   MCH 32.3  26.0 - 34.0 pg   MCHC 34.2  30.0 - 36.0 g/dL   RDW 12.7  11.5 - 15.5 %   Platelets 274  150 - 400 K/uL   Neutrophils Relative % 53  43 - 77 %   Neutro Abs 3.5  1.7 - 7.7 K/uL   Lymphocytes Relative 29  12 - 46 %   Lymphs Abs 2.0  0.7 - 4.0 K/uL    Monocytes Relative 10  3 - 12 %   Monocytes Absolute 0.7  0.1 - 1.0 K/uL   Eosinophils Relative 7 (*) 0 - 5 %   Eosinophils Absolute 0.5  0.0 - 0.7 K/uL   Basophils Relative 1  0 - 1 %   Basophils Absolute 0.1  0.0 - 0.1 K/uL  COMPREHENSIVE METABOLIC PANEL     Status: Abnormal   Collection Time    05/26/14 12:00 PM  Result Value Ref Range   Sodium 139  137 - 147 mEq/L   Potassium 4.3  3.7 - 5.3 mEq/L   Chloride 101  96 - 112 mEq/L   CO2 24  19 - 32 mEq/L   Glucose, Bld 108 (*) 70 - 99 mg/dL   BUN 15  6 - 23 mg/dL   Creatinine, Ser 1.20  0.50 - 1.35 mg/dL   Calcium 10.1  8.4 - 10.5 mg/dL   Total Protein 7.9  6.0 - 8.3 g/dL   Albumin 4.4  3.5 - 5.2 g/dL   AST 31  0 - 37 U/L   ALT 35  0 - 53 U/L   Alkaline Phosphatase 68  39 - 117 U/L   Total Bilirubin 0.6  0.3 - 1.2 mg/dL   GFR calc non Af Amer 62 (*) >90 mL/min   GFR calc Af Amer 72 (*) >90 mL/min   Comment: (NOTE)     The eGFR has been calculated using the CKD EPI equation.     This calculation has not been validated in all clinical situations.     eGFR's persistently <90 mL/min signify possible Chronic Kidney     Disease.   Anion gap 14  5 - 15  TROPONIN I     Status: None   Collection Time    05/26/14 12:00 PM      Result Value Ref Range   Troponin I <0.30  <0.30 ng/mL   Comment:            Due to the release kinetics of cTnI,     a negative result within the first hours     of the onset of symptoms does not rule out     myocardial infarction with certainty.     If myocardial infarction is still suspected,     repeat the test at appropriate intervals.  URINE CULTURE     Status: None   Collection Time    05/26/14  1:30 PM      Result Value Ref Range   Specimen Description URINE, CATHETERIZED     Special Requests NONE     Culture  Setup Time       Value: 05/27/2014 14:16     Performed at [place]       Value: NO GROWTH     Performed at [facility]   Culture        Value: NO GROWTH     Performed at [facility]   Report Status 05/28/2014 FINAL    URINALYSIS, ROUTINE W REFLEX MICROSCOPIC     Status: Abnormal   Collection Time    06/29/14  1:18 PM      Result Value Ref Range   Color, Urine YELLOW  Yellow;Lt. Yellow   APPearance CLEAR  Clear   Specific Gravity, Urine 1.015  1.000-1.030   pH 5.5  5.0 - 8.0   Total Protein, Urine NEGATIVE  Negative   Urine Glucose NEGATIVE  Negative   Ketones, ur NEGATIVE  Negative   Bilirubin Urine NEGATIVE  Negative   Hgb urine dipstick NEGATIVE  Negative   Urobilinogen, UA 0.2  0.0 - 1.0   Leukocytes, UA NEGATIVE  Negative   Nitrite NEGATIVE  Negative   WBC, UA 3-6/hpf (*) 0-2/hpf   RBC / HPF none seen  0-2/hpf   Squamous Epithelial / LPF Rare(0-4/hpf)  Rare(0-4/hpf)  URINE CULTURE     Status: None  Collection Time    06/29/14  1:45 PM      Result Value Ref Range   Colony Count NO GROWTH     Organism ID, Bacteria NO GROWTH    CBC WITH DIFFERENTIAL     Status: Abnormal   Collection Time    07/01/14  9:25 PM      Result Value Ref Range   WBC 7.3  4.0 - 10.5 K/uL   RBC 4.27  4.22 - 5.81 MIL/uL   Hemoglobin 13.7  13.0 - 17.0 g/dL   HCT 39.5  39.0 - 52.0 %   MCV 92.5  78.0 - 100.0 fL   MCH 32.1  26.0 - 34.0 pg   MCHC 34.7  30.0 - 36.0 g/dL   RDW 13.0  11.5 - 15.5 %   Platelets 213  150 - 400 K/uL   Neutrophils Relative % 63  43 - 77 %   Neutro Abs 4.6  1.7 - 7.7 K/uL   Lymphocytes Relative 22  12 - 46 %   Lymphs Abs 1.6  0.7 - 4.0 K/uL   Monocytes Relative 8  3 - 12 %   Monocytes Absolute 0.6  0.1 - 1.0 K/uL   Eosinophils Relative 7 (*) 0 - 5 %   Eosinophils Absolute 0.5  0.0 - 0.7 K/uL   Basophils Relative 0  0 - 1 %   Basophils Absolute 0.0  0.0 - 0.1 K/uL  COMPREHENSIVE METABOLIC PANEL     Status: Abnormal   Collection Time    07/01/14  9:25 PM      Result Value Ref Range   Sodium 137  137 - 147 mEq/L   Potassium 4.5  3.7 - 5.3 mEq/L   Chloride 97  96 - 112 mEq/L   CO2 27  19 - 32  mEq/L   Glucose, Bld 122 (*) 70 - 99 mg/dL   BUN 15  6 - 23 mg/dL   Creatinine, Ser 1.40 (*) 0.50 - 1.35 mg/dL   Calcium 9.4  8.4 - 10.5 mg/dL   Total Protein 7.4  6.0 - 8.3 g/dL   Albumin 3.9  3.5 - 5.2 g/dL   AST 20  0 - 37 U/L   ALT 25  0 - 53 U/L   Alkaline Phosphatase 63  39 - 117 U/L   Total Bilirubin 0.6  0.3 - 1.2 mg/dL   GFR calc non Af Amer 52 (*) >90 mL/min   GFR calc Af Amer 60 (*) >90 mL/min   Comment: (NOTE)     The eGFR has been calculated using the CKD EPI equation.     This calculation has not been validated in all clinical situations.     eGFR's persistently <90 mL/min signify possible Chronic Kidney     Disease.   Anion gap 13  5 - 15  URINALYSIS, ROUTINE W REFLEX MICROSCOPIC     Status: None   Collection Time    07/01/14  9:37 PM      Result Value Ref Range   Color, Urine YELLOW  YELLOW   APPearance CLEAR  CLEAR   Specific Gravity, Urine 1.006  1.005 - 1.030   pH 7.0  5.0 - 8.0   Glucose, UA NEGATIVE  NEGATIVE mg/dL   Hgb urine dipstick NEGATIVE  NEGATIVE   Bilirubin Urine NEGATIVE  NEGATIVE   Ketones, ur NEGATIVE  NEGATIVE mg/dL   Protein, ur NEGATIVE  NEGATIVE mg/dL   Urobilinogen, UA 0.2  0.0 - 1.0 mg/dL  Nitrite NEGATIVE  NEGATIVE   Leukocytes, UA NEGATIVE  NEGATIVE   Comment: MICROSCOPIC NOT DONE ON URINES WITH NEGATIVE PROTEIN, BLOOD, LEUKOCYTES, NITRITE, OR GLUCOSE <1000 mg/dL.    Assessment/Plan: UTI (lower urinary tract infection) Paitent currently being treated.  Repeat dip negative for infection.  Will send for culture.  Will also send for cytology giving patient's claim about possible bladder carcinoma.  Continue other medications as directed.  Follow up with urology in 1 week.

## 2014-06-29 NOTE — Patient Instructions (Signed)
We will go ahead with an MRI of your left knee. This is consistent with a medial meniscus tear that has not improved with conservative treatment to date for past 8 months. Concerning is the buckling, popping you are getting here. We will contact you with the results the business day following the MRI.

## 2014-06-29 NOTE — Progress Notes (Signed)
Pre visit review using our clinic review tool, if applicable. No additional management support is needed unless otherwise documented below in the visit note/SLS  

## 2014-06-29 NOTE — Patient Instructions (Signed)
Please continue Levaquin as directed until all tablets are gone.  Stay well hydrated.  Continue other medications as directed with the exception of pain medication. Stop the Norco and begin the Percocet taking as directed.  I am sending your urine for further testing.  I will call you with your results.  Follow-up in 1 week with me.  You will be contacted by Urology for further evaluation.

## 2014-06-30 ENCOUNTER — Encounter: Payer: Self-pay | Admitting: Family Medicine

## 2014-06-30 DIAGNOSIS — M25562 Pain in left knee: Secondary | ICD-10-CM | POA: Insufficient documentation

## 2014-06-30 NOTE — Progress Notes (Addendum)
Patient ID: [name], male   DOB: 1951-09-01, 63 y.o.   MRN: [id number]  PCP: Leeanne Rio, PA-C  Subjective:   HPI: Patient is a 63 y.o. male here for left knee pain.  Patient reports for about 8 months he has had medial left knee pain. Associated popping, buckling, and catching. No known injury. No swelling or bruising. Worse with twisting motions. Weight bearing radiographs in May showed only patellar spurring. Been treated by PCP since May for this - not improving.  Past Medical History  Diagnosis Date  . COPD (chronic obstructive pulmonary disease)   . Hypertension   . Polio   . Bronchitis   . DJD (degenerative joint disease) of hip   . Angina pectoris   . Tuberculosis   . Prostate cancer   . Chicken pox   . Rheumatic fever   . Measles   . Mumps   . GERD (gastroesophageal reflux disease)   . Alcoholism     Current Outpatient Prescriptions on File Prior to Visit  Medication Sig Dispense Refill  . albuterol (PROVENTIL HFA;VENTOLIN HFA) 108 (90 BASE) MCG/ACT inhaler Inhale 1-2 puffs into the lungs every 6 (six) hours as needed for wheezing or shortness of breath. PROAIR  3 Inhaler  1  . albuterol (PROVENTIL) (2.5 MG/3ML) 0.083% nebulizer solution Take 2.5 mg by nebulization every 6 (six) hours as needed for wheezing or shortness of breath.      . ALPRAZolam (XANAX) 1 MG tablet Take 1 tablet (1 mg total) by mouth 3 (three) times daily as needed for anxiety.  90 tablet  0  . amLODipine (NORVASC) 10 MG tablet TAKE 1 TABLET (10 MG TOTAL) BY MOUTH DAILY.  90 tablet  0  . EPINEPHrine (EPIPEN) 0.3 mg/0.3 mL IJ SOAJ injection Inject 0.3 mLs (0.3 mg total) into the muscle once.  1 Device  2  . famotidine (PEPCID) 20 MG tablet Take 1 tablet (20 mg total) by mouth 2 (two) times daily.  60 tablet  1  . Fluticasone Furoate-Vilanterol (BREO ELLIPTA) 100-25 MCG/INH AEPB INHALE 1 PUFF DAILY AND RINSE MOUTH AFTER USE.  180 each  1  . loratadine (CLARITIN) 10 MG tablet TAKE  1 TABLET BY MOUTH EVERY DAY  30 tablet  0  . losartan (COZAAR) 50 MG tablet TAKE 1 TABLET (50 MG TOTAL) BY MOUTH DAILY.  90 tablet  0  . montelukast (SINGULAIR) 10 MG tablet Take 10 mg by mouth at bedtime.      . nitroGLYCERIN (NITROSTAT) 0.4 MG SL tablet Place 0.4 mg under the tongue every 5 (five) minutes as needed. For chest pain      . ondansetron (ZOFRAN) 4 MG tablet Take 1 tablet (4 mg total) by mouth every 6 (six) hours.  12 tablet  0  . polyethylene glycol powder (MIRALAX) powder Take 17 g by mouth daily.  255 g  0   No current facility-administered medications on file prior to visit.    Past Surgical History  Procedure Laterality Date  . Rotator cuff repair    . Other surgical history       Muscle & bone Graft/Polio  . Cholecystectomy    . Nose surgery    . Nasal septum surgery    . Kidney stone surgery    . Hand surgery    . Shoulder surgery      Arthritis & Bone Spurs  . Dental surgery      Allergies  Allergen Reactions  . Ivp  Dye [Iodinated Diagnostic Agents] Anaphylaxis  . Penicillins Anaphylaxis    Heart stops  . Aspirin Other (See Comments)    Reaction unknown  . Morphine And Related Nausea And Vomiting    History   Social History  . Marital Status: Married    Spouse Name: N/A    Number of Children: N/A  . Years of Education: N/A   Occupational History  . Not on file.   Social History Main Topics  . Smoking status: Former Smoker -- 56 years    Types: Pipe    Quit date: 08/15/2013  . Smokeless tobacco: Not on file  . Alcohol Use: Yes  . Drug Use: No  . Sexual Activity: Not on file   Other Topics Concern  . Not on file   Social History Narrative  . No narrative on file    Family History  Problem Relation Age of Onset  . Alzheimer's disease Father 4    Deceased  . Stomach cancer Father   . Heart attack Father   . Heart disease Father   . Skin cancer Mother     Facial-Living  . Alcohol abuse Sister     x2  . Mental illness Sister      x2  . Diabetes Maternal Aunt     x2  . Thyroid disease Maternal Aunt     x4  . Diabetes Maternal Uncle   . Tuberculosis Paternal Grandfather   . Tuberculosis Paternal Grandmother   . Alzheimer's disease Paternal Aunt   . Alzheimer's disease Paternal Uncle     BP 128/77  Pulse 65  Ht 6\' 1"  (1.854 m)  Wt 214 lb (97.07 kg)  BMI 28.24 kg/m2  Review of Systems: See HPI above.    Objective:  Physical Exam:  Gen: NAD  Left knee: No gross deformity, ecchymoses, effusion. TTP medial joint line. FROM. Negative ant/post drawers. Negative valgus/varus testing. Negative lachmanns. Positive medial mcmurrays, apleys.  Negative patellar apprehension, clarkes. NV intact distally.    Assessment & Plan:  1. Left knee pain - Radiographs negative - only minimal patellofemoral DJD.  No medial arthritis.  Exam consistent with medial meniscus tear and concerning he's getting catching, popping, and instability as a result.  Worsening past 8 months and been treated since May by PCP without much benefit.  Will go ahead with MRI to assess for medial meniscus tear, radial or bucket handle type.  Addendum:  Received denial letter from [facility] for his MRI.  Surprising given he has been treated since May for this issue, now having mechanical symptoms (catching and instability).  Have filed an appeal - will fax this note to them as well.  More specifically patient's PCP on 5/11 ordered x-rays (results above) and prescribed him prednisone burst, norco, and had him use aspercreme in addition to patient doing exercises on his own for legs.  He had not improved leading up to my visit with him.

## 2014-06-30 NOTE — Assessment & Plan Note (Signed)
Radiographs negative - only minimal patellofemoral DJD.  No medial arthritis.  Exam consistent with medial meniscus tear and concerning he's getting catching, popping, and instability as a result.  Worsening past 8 months and been treated since May by PCP without much benefit.  Will go ahead with MRI to assess for medial meniscus tear, radial or bucket handle type.

## 2014-07-01 ENCOUNTER — Emergency Department (HOSPITAL_BASED_OUTPATIENT_CLINIC_OR_DEPARTMENT_OTHER)
Admission: EM | Admit: 2014-07-01 | Discharge: 2014-07-01 | Disposition: A | Discharge status: Home / Self Care | Payer: Medicare HMO | Attending: Emergency Medicine | Admitting: Emergency Medicine

## 2014-07-01 ENCOUNTER — Encounter (HOSPITAL_BASED_OUTPATIENT_CLINIC_OR_DEPARTMENT_OTHER): Payer: Self-pay | Admitting: Emergency Medicine

## 2014-07-01 ENCOUNTER — Telehealth: Payer: Self-pay | Admitting: Physician Assistant

## 2014-07-01 DIAGNOSIS — Z8546 Personal history of malignant neoplasm of prostate: Secondary | ICD-10-CM | POA: Diagnosis not present

## 2014-07-01 DIAGNOSIS — Z79899 Other long term (current) drug therapy: Secondary | ICD-10-CM | POA: Diagnosis not present

## 2014-07-01 DIAGNOSIS — Z8739 Personal history of other diseases of the musculoskeletal system and connective tissue: Secondary | ICD-10-CM | POA: Diagnosis not present

## 2014-07-01 DIAGNOSIS — Z880 Allergy status to penicillin: Secondary | ICD-10-CM | POA: Insufficient documentation

## 2014-07-01 DIAGNOSIS — I10 Essential (primary) hypertension: Secondary | ICD-10-CM | POA: Diagnosis not present

## 2014-07-01 DIAGNOSIS — J449 Chronic obstructive pulmonary disease, unspecified: Secondary | ICD-10-CM | POA: Insufficient documentation

## 2014-07-01 DIAGNOSIS — Z8611 Personal history of tuberculosis: Secondary | ICD-10-CM | POA: Insufficient documentation

## 2014-07-01 DIAGNOSIS — K219 Gastro-esophageal reflux disease without esophagitis: Secondary | ICD-10-CM | POA: Insufficient documentation

## 2014-07-01 DIAGNOSIS — J4489 Other specified chronic obstructive pulmonary disease: Secondary | ICD-10-CM | POA: Insufficient documentation

## 2014-07-01 DIAGNOSIS — Z792 Long term (current) use of antibiotics: Secondary | ICD-10-CM | POA: Insufficient documentation

## 2014-07-01 DIAGNOSIS — Z8619 Personal history of other infectious and parasitic diseases: Secondary | ICD-10-CM | POA: Insufficient documentation

## 2014-07-01 DIAGNOSIS — E860 Dehydration: Secondary | ICD-10-CM | POA: Diagnosis not present

## 2014-07-01 DIAGNOSIS — Z8612 Personal history of poliomyelitis: Secondary | ICD-10-CM | POA: Diagnosis not present

## 2014-07-01 DIAGNOSIS — Z87891 Personal history of nicotine dependence: Secondary | ICD-10-CM | POA: Diagnosis not present

## 2014-07-01 LAB — COMPREHENSIVE METABOLIC PANEL
ALT: 25 U/L (ref 0–53)
AST: 20 U/L (ref 0–37)
Albumin: 3.9 g/dL (ref 3.5–5.2)
Alkaline Phosphatase: 63 U/L (ref 39–117)
Anion gap: 13 (ref 5–15)
BILIRUBIN TOTAL: 0.6 mg/dL (ref 0.3–1.2)
BUN: 15 mg/dL (ref 6–23)
CO2: 27 mEq/L (ref 19–32)
Calcium: 9.4 mg/dL (ref 8.4–10.5)
Chloride: 97 mEq/L (ref 96–112)
Creatinine, Ser: 1.4 mg/dL — ABNORMAL HIGH (ref 0.50–1.35)
GFR calc Af Amer: 60 mL/min — ABNORMAL LOW (ref >90)
GFR, EST NON AFRICAN AMERICAN: 52 mL/min — AB (ref >90)
Glucose, Bld: 122 mg/dL — ABNORMAL HIGH (ref 70–99)
Potassium: 4.5 mEq/L (ref 3.7–5.3)
SODIUM: 137 meq/L (ref 137–147)
Total Protein: 7.4 g/dL (ref 6.0–8.3)

## 2014-07-01 LAB — CBC WITH DIFFERENTIAL/PLATELET
Basophils Absolute: 0 10*3/uL (ref 0.0–0.1)
Basophils Relative: 0 % (ref 0–1)
Eosinophils Absolute: 0.5 10*3/uL (ref 0.0–0.7)
Eosinophils Relative: 7 % — ABNORMAL HIGH (ref 0–5)
HEMATOCRIT: 39.5 % (ref 39.0–52.0)
Hemoglobin: 13.7 g/dL (ref 13.0–17.0)
LYMPHS PCT: 22 % (ref 12–46)
Lymphs Abs: 1.6 10*3/uL (ref 0.7–4.0)
MCH: 32.1 pg (ref 26.0–34.0)
MCHC: 34.7 g/dL (ref 30.0–36.0)
MCV: 92.5 fL (ref 78.0–100.0)
Monocytes Absolute: 0.6 10*3/uL (ref 0.1–1.0)
Monocytes Relative: 8 % (ref 3–12)
NEUTROS ABS: 4.6 10*3/uL (ref 1.7–7.7)
Neutrophils Relative %: 63 % (ref 43–77)
PLATELETS: 213 10*3/uL (ref 150–400)
RBC: 4.27 MIL/uL (ref 4.22–5.81)
RDW: 13 % (ref 11.5–15.5)
WBC: 7.3 10*3/uL (ref 4.0–10.5)

## 2014-07-01 LAB — URINALYSIS, ROUTINE W REFLEX MICROSCOPIC
Bilirubin Urine: NEGATIVE
GLUCOSE, UA: NEGATIVE mg/dL
HGB URINE DIPSTICK: NEGATIVE
KETONES UR: NEGATIVE mg/dL
Leukocytes, UA: NEGATIVE
Nitrite: NEGATIVE
Protein, ur: NEGATIVE mg/dL
Specific Gravity, Urine: 1.006 (ref 1.005–1.030)
Urobilinogen, UA: 0.2 mg/dL (ref 0.0–1.0)
pH: 7 (ref 5.0–8.0)

## 2014-07-01 LAB — URINE CULTURE
Colony Count: NO GROWTH
ORGANISM ID, BACTERIA: NO GROWTH

## 2014-07-01 MED ORDER — SODIUM CHLORIDE 0.9 % IV SOLN
1000.0000 mL | INTRAVENOUS | Status: DC
  Administered 2014-07-01: 1000 mL via INTRAVENOUS

## 2014-07-01 MED ORDER — ONDANSETRON HCL 4 MG/2ML IJ SOLN
4.0000 mg | Freq: Once | INTRAMUSCULAR | Status: AC
  Administered 2014-07-01: 4 mg via INTRAVENOUS
  Filled 2014-07-01: qty 2

## 2014-07-01 MED ORDER — SODIUM CHLORIDE 0.9 % IV SOLN
1000.0000 mL | Freq: Once | INTRAVENOUS | Status: AC
  Administered 2014-07-01: 1000 mL via INTRAVENOUS

## 2014-07-01 NOTE — ED Notes (Signed)
Pt c/o being dehydrated and dry mouth after takeing meds last pm

## 2014-07-01 NOTE — Discharge Instructions (Signed)
Dehydration, Adult Dehydration is when you lose more fluids from the body than you take in. Vital organs like the kidneys, brain, and heart cannot function without a proper amount of fluids and salt. Any loss of fluids from the body can cause dehydration.  CAUSES   Vomiting.  Diarrhea.  Excessive sweating.  Excessive urine output.  Fever. SYMPTOMS  Mild dehydration  Thirst.  Dry lips.  Slightly dry mouth. Moderate dehydration  Very dry mouth.  Sunken eyes.  Skin does not bounce back quickly when lightly pinched and released.  Dark urine and decreased urine production.  Decreased tear production.  Headache. Severe dehydration  Very dry mouth.  Extreme thirst.  Rapid, weak pulse (more than 100 beats per minute at rest).  Cold hands and feet.  Not able to sweat in spite of heat and temperature.  Rapid breathing.  Blue lips.  Confusion and lethargy.  Difficulty being awakened.  Minimal urine production.  No tears. DIAGNOSIS  Your caregiver will diagnose dehydration based on your symptoms and your exam. Blood and urine tests will help confirm the diagnosis. The diagnostic evaluation should also identify the cause of dehydration. TREATMENT  Treatment of mild or moderate dehydration can often be done at home by increasing the amount of fluids that you drink. It is best to drink small amounts of fluid more often. Drinking too much at one time can make vomiting worse. Refer to the home care instructions below. Severe dehydration needs to be treated at the hospital where you will probably be given intravenous (IV) fluids that contain water and electrolytes. HOME CARE INSTRUCTIONS   Ask your caregiver about specific rehydration instructions.  Drink enough fluids to keep your urine clear or pale yellow.  Drink small amounts frequently if you have nausea and vomiting.  Eat as you normally do.  Avoid:  Foods or drinks high in sugar.  Carbonated  drinks.  Juice.  Extremely hot or cold fluids.  Drinks with caffeine.  Fatty, greasy foods.  Alcohol.  Tobacco.  Overeating.  Gelatin desserts.  Wash your hands well to avoid spreading bacteria and viruses.  Only take over-the-counter or prescription medicines for pain, discomfort, or fever as directed by your caregiver.  Ask your caregiver if you should continue all prescribed and over-the-counter medicines.  Keep all follow-up appointments with your caregiver. SEEK MEDICAL CARE IF:  You have abdominal pain and it increases or stays in one area (localizes).  You have a rash, stiff neck, or severe headache.  You are irritable, sleepy, or difficult to awaken.  You are weak, dizzy, or extremely thirsty. SEEK IMMEDIATE MEDICAL CARE IF:   You are unable to keep fluids down or you get worse despite treatment.  You have frequent episodes of vomiting or diarrhea.  You have blood or green matter (bile) in your vomit.  You have blood in your stool or your stool looks black and tarry.  You have not urinated in 6 to 8 hours, or you have only urinated a small amount of very dark urine.  You have a fever.  You faint. MAKE SURE YOU:   Understand these instructions.  Will watch your condition.  Will get help right away if you are not doing well or get worse. Document Released: 10/01/2005 Document Revised: 12/24/2011 Document Reviewed: 05/21/2011 Exit[employer] Patient Information 2015 [employer]. This information is not intended to replace advice given to you by your health care provider. Make sure you discuss any questions you have with your health care   provider.  

## 2014-07-01 NOTE — Telephone Encounter (Signed)
Caller name: Terrie Relation to pt: Call back number:[phone] - [phone]   Reason for call:  Pt states the the pharmacy told them that the Rx oxycodone-acetaminophen (LYNOX) 10-300 MG per tablet does not exist and he would need to get it rewritten to 3-25 percocet.  Pt would like this rewritten for this or something that the pharmacy has.

## 2014-07-01 NOTE — ED Notes (Signed)
Had enema last pm and took Flomax,  States went to bathroom 15-20 times last pm  States has been drinking water today,  But states mouth is dry,  No saliva

## 2014-07-01 NOTE — ED Provider Notes (Signed)
CSN: [id number]     Arrival date & time 07/01/14  2104 History   First MD Initiated Contact with Patient 07/01/14 2110     This chart was scribed for Jonathon Rank, MD by Forrestine Him, ED Scribe. This patient was seen in room MH11/MH11 and the patient's care was started 9:16 PM.   Chief Complaint  Patient presents with  . Dehydration   The history is provided by the patient. No language interpreter was used.    HPI Comments: [name] is a 63 y.o. male with a PMHx of COPD, HTN, polio, DJD, and GERD who presents to the Emergency Department complaining of dehydration onset 1 day. Pt reports constipation and inability to have a bowel movement for last 5-8 days. He admits to taking an enema last night followed by a dose of Flomax. Typically he takes Flomax 2 times daily. Shortly after enema and dose of Flomax, he experienced ongoing urinary frequency throughout the whole night. Now reports constant thirst despite taking in plenty of fluids. He denies any diarrhea, fever, or vomiting. [name] reports chronic pain at baseline but denies any new recent pain. He currently takes Hydrocodone daily to manage his pain.  Past Medical History  Diagnosis Date  . COPD (chronic obstructive pulmonary disease)   . Hypertension   . Polio   . Bronchitis   . DJD (degenerative joint disease) of hip   . Angina pectoris   . Tuberculosis   . Prostate cancer   . Chicken pox   . Rheumatic fever   . Measles   . Mumps   . GERD (gastroesophageal reflux disease)   . Alcoholism    Past Surgical History  Procedure Laterality Date  . Rotator cuff repair    . Other surgical history       Muscle & bone Graft/Polio  . Cholecystectomy    . Nose surgery    . Nasal septum surgery    . Kidney stone surgery    . Hand surgery    . Shoulder surgery      Arthritis & Bone Spurs  . Dental surgery     Family History  Problem Relation Age of Onset  . Alzheimer's disease Father 4    Deceased  . Stomach cancer  Father   . Heart attack Father   . Heart disease Father   . Skin cancer Mother     Facial-Living  . Alcohol abuse Sister     x2  . Mental illness Sister     x2  . Diabetes Maternal Aunt     x2  . Thyroid disease Maternal Aunt     x4  . Diabetes Maternal Uncle   . Tuberculosis Paternal Grandfather   . Tuberculosis Paternal Grandmother   . Alzheimer's disease Paternal Aunt   . Alzheimer's disease Paternal Uncle    History  Substance Use Topics  . Smoking status: Former Smoker -- 28 years    Types: Pipe    Quit date: 08/15/2013  . Smokeless tobacco: Not on file  . Alcohol Use: Yes    Review of Systems  Constitutional: Negative for fever.  Gastrointestinal: Negative for vomiting and diarrhea.      Allergies  Ivp dye; Penicillins; Aspirin; and Morphine and related  Home Medications   Prior to Admission medications   Medication Sig Start Date End Date Taking? Authorizing Provider  albuterol (PROVENTIL HFA;VENTOLIN HFA) 108 (90 BASE) MCG/ACT inhaler Inhale 1-2 puffs into the lungs every 6 (six) hours  as needed for wheezing or shortness of breath. PROAIR 06/04/14   Brunetta Jeans, PA-C  albuterol (PROVENTIL) (2.5 MG/3ML) 0.083% nebulizer solution Take 2.5 mg by nebulization every 6 (six) hours as needed for wheezing or shortness of breath.    Historical Provider, MD  ALPRAZolam [name]) 1 MG tablet Take 1 tablet (1 mg total) by mouth 3 (three) times daily as needed for anxiety. 06/03/14   Brunetta Jeans, PA-C  amLODipine (NORVASC) 10 MG tablet TAKE 1 TABLET (10 MG TOTAL) BY MOUTH DAILY. 04/02/14   Brunetta Jeans, PA-C  EPINEPHrine (EPIPEN) 0.3 mg/0.3 mL IJ SOAJ injection Inject 0.3 mLs (0.3 mg total) into the muscle once. 04/05/14   Brunetta Jeans, PA-C  famotidine (PEPCID) 20 MG tablet Take 1 tablet (20 mg total) by mouth 2 (two) times daily. 03/10/14   Brunetta Jeans, PA-C  Fluticasone Furoate-Vilanterol (BREO ELLIPTA) 100-25 MCG/INH AEPB INHALE 1 PUFF DAILY AND RINSE  MOUTH AFTER USE. 06/04/14   Brunetta Jeans, PA-C  gabapentin (NEURONTIN) 300 MG capsule One tab PO daily for a week, then twice daily for a week, then three times daily. 04/05/14   Historical Provider, MD  levofloxacin (LEVAQUIN) 750 MG tablet Take 750 mg by mouth daily.    Historical Provider, MD  loratadine (CLARITIN) 10 MG tablet TAKE 1 TABLET BY MOUTH EVERY DAY 01/25/14   Brunetta Jeans, PA-C  losartan (COZAAR) 50 MG tablet TAKE 1 TABLET (50 MG TOTAL) BY MOUTH DAILY. 04/02/14   Brunetta Jeans, PA-C  montelukast (SINGULAIR) 10 MG tablet Take 10 mg by mouth at bedtime.    Historical Provider, MD  nitroGLYCERIN (NITROSTAT) 0.4 MG SL tablet Place 0.4 mg under the tongue every 5 (five) minutes as needed. For chest pain    Historical Provider, MD  ondansetron (ZOFRAN) 4 MG tablet Take 1 tablet (4 mg total) by mouth every 6 (six) hours. 05/15/14   Shari A Upstill, PA-C  oxycodone-acetaminophen (LYNOX) 10-300 MG per tablet Take 1 tablet by mouth every 6 (six) hours as needed for pain. 06/29/14   Brunetta Jeans, PA-C  polyethylene glycol powder (MIRALAX) powder Take 17 g by mouth daily. 05/26/14   Hoy Morn, MD  tamsulosin (FLOMAX) 0.4 MG CAPS capsule Take 0.4 mg by mouth. 05/31/14   Historical Provider, MD   Triage Vitals: BP 157/68  Pulse 84  Temp(Src) 98.4 F (36.9 C) (Oral)  Resp 18  Ht 6\' 1"  (1.854 m)  Wt 213 lb (96.616 kg)  BMI 28.11 kg/m2  SpO2 98%   Physical Exam  Nursing note and vitals reviewed. Constitutional: He appears well-developed and well-nourished. No distress.  HENT:  Head: Normocephalic and atraumatic.  Right Ear: External ear normal.  Left Ear: External ear normal.  Mouth/Throat: Oropharynx is clear and moist.  Eyes: Conjunctivae are normal. Right eye exhibits no discharge. Left eye exhibits no discharge. No scleral icterus.  Neck: Neck supple. No tracheal deviation present.  Cardiovascular: Normal rate, regular rhythm and intact distal pulses.   Pulmonary/Chest:  Effort normal and breath sounds normal. No stridor. No respiratory distress. He has no wheezes. He has no rales.  Abdominal: Soft. Bowel sounds are normal. He exhibits no distension. There is no tenderness. There is no rebound and no guarding.  Musculoskeletal: He exhibits no edema and no tenderness.  Neurological: He is alert. He has normal strength. He displays atrophy (L lower extremity). No cranial nerve deficit (no facial droop, extraocular movements intact, no slurred speech) or  sensory deficit. He exhibits normal muscle tone. He displays no seizure activity. Coordination normal.  Skin: Skin is warm and dry. No rash noted.  Psychiatric: He has a normal mood and affect.    ED Course  Procedures (including critical care time)  DIAGNOSTIC STUDIES: Oxygen Saturation is 98% on RA, Normal by my interpretation.    COORDINATION OF CARE: 9:23 PM- Will give fluids, and Zofran. Will order CBC, CMP, and urinalysis. Discussed treatment plan with pt at bedside and pt agreed to plan.     Labs Review Labs Reviewed  CBC WITH DIFFERENTIAL - Abnormal; Notable for the following:    Eosinophils Relative 7 (*)    All other components within normal limits  COMPREHENSIVE METABOLIC PANEL - Abnormal; Notable for the following:    Glucose, Bld 122 (*)    Creatinine, Ser 1.40 (*)    GFR calc non Af Amer 52 (*)    GFR calc Af Amer 60 (*)    All other components within normal limits  URINALYSIS, ROUTINE W REFLEX MICROSCOPIC      MDM   Final diagnoses:  Dehydration    Pt had urinary frequency after flomax and used an enema.  No uti.  No vomiting.  No abdominal pain.  Mild increase in creatinine. Taking fluid well.  Stable for dc  I personally performed the services described in this documentation, which was scribed in my presence. The recorded information has been reviewed and is accurate.    Jonathon Rank, MD 07/01/14 2230

## 2014-07-02 ENCOUNTER — Telehealth: Payer: Self-pay | Admitting: Emergency Medicine

## 2014-07-02 ENCOUNTER — Telehealth: Payer: Self-pay | Admitting: Physician Assistant

## 2014-07-02 MED ORDER — OXYCODONE-ACETAMINOPHEN 10-325 MG PO TABS: 1.0000 | ORAL_TABLET | Freq: Three times a day (TID) | ORAL | Status: DC | PRN

## 2014-07-02 NOTE — Telephone Encounter (Signed)
Caller name: Eason Relation to pt: Call back number::[phone]  Reason for call:  Pt said call him back if you needed him

## 2014-07-02 NOTE — Telephone Encounter (Signed)
LMOM with contact name and number [for return call, if needed] RE: Rx ready for p/u at front desk until 5pm today/SLS

## 2014-07-02 NOTE — Telephone Encounter (Signed)
PW- is this okay? Please advise thanks!

## 2014-07-02 NOTE — Telephone Encounter (Signed)
LMTCB

## 2014-07-02 NOTE — Telephone Encounter (Signed)
Rx for Percocet printed, signed and at front for pickup. Please call patient.

## 2014-07-02 NOTE — Telephone Encounter (Signed)
Yes - as long as Dr Joya Gaskins approves. thanks

## 2014-07-02 NOTE — Telephone Encounter (Signed)
RB- please advise if this is okay

## 2014-07-02 NOTE — Telephone Encounter (Signed)
i am ok with this 

## 2014-07-04 ENCOUNTER — Other Ambulatory Visit: Payer: Self-pay | Admitting: Physician Assistant

## 2014-07-05 ENCOUNTER — Encounter: Payer: Self-pay | Admitting: Physician Assistant

## 2014-07-05 NOTE — Telephone Encounter (Signed)
Appt set with PW for this Thursday at [place], [place]

## 2014-07-06 ENCOUNTER — Telehealth: Payer: Self-pay | Admitting: Physician Assistant

## 2014-07-06 NOTE — Telephone Encounter (Signed)
[  Patient has Appt set with Dr. Joya Gaskins for this Thursday at [place], [facility]] Please Advise.

## 2014-07-06 NOTE — Telephone Encounter (Signed)
Pt will need office visit prior to addition abx.  If symptoms have worsened, he should be scheduled here tomorrow.

## 2014-07-06 NOTE — Telephone Encounter (Signed)
Caller name: Amore  Relation to pt: self  Call back number: [phone] Pharmacy: [phone] [id number] - [place], [place].    Reason for call: pt requesting a z pack

## 2014-07-07 NOTE — Addendum Note (Signed)
Addended by: Sherrie George F on: 07/07/2014 03:38 PM   Modules accepted: Orders

## 2014-07-08 ENCOUNTER — Encounter: Payer: Self-pay | Admitting: Critical Care Medicine

## 2014-07-08 ENCOUNTER — Ambulatory Visit (INDEPENDENT_AMBULATORY_CARE_PROVIDER_SITE_OTHER): Payer: Medicare HMO | Admitting: Critical Care Medicine

## 2014-07-08 ENCOUNTER — Telehealth: Payer: Self-pay | Admitting: Critical Care Medicine

## 2014-07-08 VITALS — BP 144/78 | HR 80 | Temp 98.0°F | Ht 73.0 in | Wt 214.0 lb

## 2014-07-08 DIAGNOSIS — R0989 Other specified symptoms and signs involving the circulatory and respiratory systems: Secondary | ICD-10-CM

## 2014-07-08 DIAGNOSIS — R0600 Dyspnea, unspecified: Secondary | ICD-10-CM

## 2014-07-08 DIAGNOSIS — R0609 Other forms of dyspnea: Secondary | ICD-10-CM

## 2014-07-08 DIAGNOSIS — J42 Unspecified chronic bronchitis: Secondary | ICD-10-CM

## 2014-07-08 DIAGNOSIS — A159 Respiratory tuberculosis unspecified: Secondary | ICD-10-CM | POA: Insufficient documentation

## 2014-07-08 DIAGNOSIS — J441 Chronic obstructive pulmonary disease with (acute) exacerbation: Secondary | ICD-10-CM

## 2014-07-08 MED ORDER — PREDNISONE 10 MG PO TABS: ORAL_TABLET | ORAL | Status: DC

## 2014-07-08 MED ORDER — FLUTICASONE PROPIONATE 50 MCG/ACT NA SUSP: 2.0000 | Freq: Every day | NASAL | Status: DC

## 2014-07-08 MED ORDER — ACLIDINIUM BROMIDE 400 MCG/ACT IN AEPB: INHALATION_SPRAY | RESPIRATORY_TRACT | Status: DC

## 2014-07-08 MED ORDER — OMEPRAZOLE 20 MG PO CPDR: 20.0000 mg | DELAYED_RELEASE_CAPSULE | Freq: Every day | ORAL | Status: DC

## 2014-07-08 NOTE — Progress Notes (Signed)
Subjective:    Patient ID: [name], male    DOB: 1950-12-23, 63 y.o.   MRN: [id number]  HPI 07/08/2014 Chief Complaint  Patient presents with  . Advice Only    Wanting to switch from RB to PW.  [name] has been managing pt's COPD.  Pt c/o chest tightness, SOB with exertion X1 week.   Copd dx age 46. Hx of allergies.  Dx asthma age 58. Pipe smoker only. Quit 08/2013.  Bronchitis ave previously three times per year, now every two months.  Symptoms one week ago.  Pt used neb med continuously Mucus is white to yellow. No blood. Notes severe tightness and pain. Dyspnea, exertion only.   Diff time getting mucus out of nose. Notes heartburn, meds help to some degree.  Pt had myoview : normal  Urology: Alinda Money. Prostate CA: never had XRT or Surgery.    Past Medical History  Diagnosis Date  . COPD (chronic obstructive pulmonary disease)   . Hypertension   . Polio   . Bronchitis   . Angina pectoris   . Tuberculosis 1984    Hosp 4 months rx , left early  . Prostate cancer   . Chicken pox   . Rheumatic fever   . Measles   . Mumps   . GERD (gastroesophageal reflux disease)   . Alcoholism      Family History  Problem Relation Age of Onset  . Alzheimer's disease Father 4    Deceased  . Stomach cancer Father   . Heart attack Father   . Heart disease Father   . Skin cancer Mother     Facial-Living  . Alcohol abuse Sister     x2  . Mental illness Sister     x2  . Diabetes Maternal Aunt     x2  . Thyroid disease Maternal Aunt     x4  . Diabetes Maternal Uncle   . Tuberculosis Paternal Grandfather   . Tuberculosis Paternal Grandmother   . Alzheimer's disease Paternal Aunt   . Alzheimer's disease Paternal Uncle      History   Social History  . Marital Status: Married    Spouse Name: N/A    Number of Children: N/A  . Years of Education: N/A   Occupational History  . Not on file.   Social History Main Topics  . Smoking status: Former Smoker -- 5.00  packs/day for 47 years    Types: Pipe, Cigarettes    Quit date: 08/15/2013  . Smokeless tobacco: Never Used     Comment: Would smoke a pack of pipe tobacco(1.5oz) every 10 days.   . Alcohol Use: Yes  . Drug Use: No  . Sexual Activity: Not on file   Other Topics Concern  . Not on file   Social History Narrative  . No narrative on file     Allergies  Allergen Reactions  . Ivp Dye [Iodinated Diagnostic Agents] Anaphylaxis  . Penicillins Anaphylaxis    Heart stops  . Aspirin Other (See Comments)    Reaction unknown  . Morphine And Related Nausea And Vomiting  . Nsaids Other (See Comments)    Difficulty breathing     Outpatient Prescriptions Prior to Visit  Medication Sig Dispense Refill  . albuterol (PROVENTIL HFA;VENTOLIN HFA) 108 (90 BASE) MCG/ACT inhaler Inhale 1-2 puffs into the lungs every 6 (six) hours as needed for wheezing or shortness of breath. PROAIR  3 Inhaler  1  . albuterol (PROVENTIL) (2.5 MG/3ML)  0.083% nebulizer solution Take 2.5 mg by nebulization every 6 (six) hours as needed for wheezing or shortness of breath. Pt uses 2-3X daily      . ALPRAZolam (XANAX) 1 MG tablet Take 1 tablet (1 mg total) by mouth 3 (three) times daily as needed for anxiety.  90 tablet  0  . amLODipine (NORVASC) 10 MG tablet TAKE 1 TABLET (10 MG TOTAL) BY MOUTH DAILY.  90 tablet  0  . EPINEPHrine (EPIPEN) 0.3 mg/0.3 mL IJ SOAJ injection Inject 0.3 mLs (0.3 mg total) into the muscle once.  1 Device  2  . famotidine (PEPCID) 20 MG tablet Take 1 tablet (20 mg total) by mouth 2 (two) times daily.  60 tablet  1  . Fluticasone Furoate-Vilanterol (BREO ELLIPTA) 100-25 MCG/INH AEPB INHALE 1 PUFF DAILY AND RINSE MOUTH AFTER USE.  180 each  1  . losartan (COZAAR) 50 MG tablet TAKE 1 TABLET (50 MG TOTAL) BY MOUTH DAILY.  90 tablet  0  . montelukast (SINGULAIR) 10 MG tablet Take 10 mg by mouth at bedtime.      . nitroGLYCERIN (NITROSTAT) 0.4 MG SL tablet Place 0.4 mg under the tongue every 5 (five)  minutes as needed. For chest pain      . oxyCODONE-acetaminophen (PERCOCET) 10-325 MG per tablet Take 1 tablet by mouth every 8 (eight) hours as needed for pain.  120 tablet  0  . tamsulosin (FLOMAX) 0.4 MG CAPS capsule Take 0.4 mg by mouth 2 (two) times daily.       Marland Kitchen loratadine (CLARITIN) 10 MG tablet TAKE 1 TABLET BY MOUTH EVERY DAY  30 tablet  0  . gabapentin (NEURONTIN) 300 MG capsule One tab PO daily for a week, then twice daily for a week, then three times daily.      Marland Kitchen levofloxacin (LEVAQUIN) 750 MG tablet Take 750 mg by mouth daily.      . ondansetron (ZOFRAN) 4 MG tablet Take 1 tablet (4 mg total) by mouth every 6 (six) hours.  12 tablet  0  . polyethylene glycol powder (MIRALAX) powder Take 17 g by mouth daily.  255 g  0   No facility-administered medications prior to visit.      Review of Systems  Constitutional: Positive for fatigue. Negative for fever and unexpected weight change.  HENT: Positive for congestion and postnasal drip. Negative for dental problem, ear pain, nosebleeds, rhinorrhea, sinus pressure, sneezing, sore throat and trouble swallowing.   Eyes: Negative for redness and itching.  Respiratory: Positive for cough, chest tightness and shortness of breath. Negative for wheezing.   Cardiovascular: Negative for palpitations and leg swelling.  Gastrointestinal: Negative for nausea and vomiting.  Genitourinary: Negative for dysuria.  Musculoskeletal: Negative for joint swelling.  Skin: Negative for rash.  Neurological: Negative for headaches.  Hematological: Does not bruise/bleed easily.  Psychiatric/Behavioral: Negative for dysphoric mood. The patient is not nervous/anxious.        Objective:   Physical Exam Filed Vitals:   07/08/14 1115  BP: 144/78  Pulse: 80  Temp: 98 F (36.7 C)  TempSrc: Oral  Height: 6\' 1"  (1.854 m)  Weight: 214 lb (97.07 kg)  SpO2: 100%    Gen: Pleasant, well-nourished, in no distress,  normal affect  ENT: No lesions,  mouth  clear,  oropharynx clear, no postnasal drip  Neck: No JVD, no TMG, no carotid bruits  Lungs: No use of accessory muscles, no dullness to percussion, expired wheezes  Cardiovascular: RRR, heart sounds normal,  no murmur or gallops, no peripheral edema  Abdomen: soft and NT, no HSM,  BS normal  Musculoskeletal: No deformities, no cyanosis or clubbing  Neuro: alert, non focal  Skin: Warm, no lesions or rashes  No results found.  Spirometry: severe obstruction and restriction      Assessment & Plan:   COPD (chronic obstructive pulmonary disease) Gold C Frequent exacerbations Copd with frequent exacerbations, not controlled on Breo alone.  Ex smoker. Gold Stage C GERD a major ppt factor Plan Prednisone 10mg  Take 4 for three days 3 for three days 2 for three days 1 for three days and stop Start Tudorza one puff twice daily Stay on Breo one puff daily Fluticasone two puff ea nostril daily Start omeprazole 20mg  before breakfast daily Stay on pepcid twice daily Reflux diet Pulmonary rehab referral to [facility] Overnight sleep oxygen test to be obtained Return 1 month [place]     Post polio syndrome with R hemidiaphragm elevation.  Lending pt to have restrictive component   Updated Medication List Outpatient Encounter Prescriptions as of 07/08/2014  Medication Sig  . albuterol (PROVENTIL HFA;VENTOLIN HFA) 108 (90 BASE) MCG/ACT inhaler Inhale 1-2 puffs into the lungs every 6 (six) hours as needed for wheezing or shortness of breath. PROAIR  . albuterol (PROVENTIL) (2.5 MG/3ML) 0.083% nebulizer solution Take 2.5 mg by nebulization every 6 (six) hours as needed for wheezing or shortness of breath. Pt uses 2-3X daily  . ALPRAZolam (XANAX) 1 MG tablet Take 1 tablet (1 mg total) by mouth 3 (three) times daily as needed for anxiety.  Marland Kitchen amLODipine (NORVASC) 10 MG tablet TAKE 1 TABLET (10 MG TOTAL) BY MOUTH DAILY.  Marland Kitchen EPINEPHrine (EPIPEN) 0.3 mg/0.3 mL IJ SOAJ injection Inject 0.3 mLs  (0.3 mg total) into the muscle once.  . famotidine (PEPCID) 20 MG tablet Take 1 tablet (20 mg total) by mouth 2 (two) times daily.  . Fluticasone Furoate-Vilanterol (BREO ELLIPTA) 100-25 MCG/INH AEPB INHALE 1 PUFF DAILY AND RINSE MOUTH AFTER USE.  Marland Kitchen loratadine (CLARITIN) 10 MG tablet TAKE 1 TABLET BY MOUTH EVERY DAY as needed  . losartan (COZAAR) 50 MG tablet TAKE 1 TABLET (50 MG TOTAL) BY MOUTH DAILY.  . montelukast (SINGULAIR) 10 MG tablet Take 10 mg by mouth at bedtime.  . nitroGLYCERIN (NITROSTAT) 0.4 MG SL tablet Place 0.4 mg under the tongue every 5 (five) minutes as needed. For chest pain  . oxyCODONE-acetaminophen (PERCOCET) 10-325 MG per tablet Take 1 tablet by mouth every 8 (eight) hours as needed for pain.  . tamsulosin (FLOMAX) 0.4 MG CAPS capsule Take 0.4 mg by mouth 2 (two) times daily.   . [DISCONTINUED] loratadine (CLARITIN) 10 MG tablet TAKE 1 TABLET BY MOUTH EVERY DAY  . Aclidinium Bromide (TUDORZA PRESSAIR) 400 MCG/ACT AEPB One puff twice daily  . fluticasone (FLONASE) 50 MCG/ACT nasal spray Place 2 sprays into both nostrils daily.  Marland Kitchen omeprazole (PRILOSEC) 20 MG capsule Take 1 capsule (20 mg total) by mouth daily.  . predniSONE (DELTASONE) 10 MG tablet Take 4 for three days 3 for three days 2 for three days 1 for three days and stop  . [DISCONTINUED] gabapentin (NEURONTIN) 300 MG capsule One tab PO daily for a week, then twice daily for a week, then three times daily.  . [DISCONTINUED] levofloxacin (LEVAQUIN) 750 MG tablet Take 750 mg by mouth daily.  . [DISCONTINUED] ondansetron (ZOFRAN) 4 MG tablet Take 1 tablet (4 mg total) by mouth every 6 (six) hours.  . [  DISCONTINUED] polyethylene glycol powder (MIRALAX) powder Take 17 g by mouth daily.

## 2014-07-08 NOTE — Telephone Encounter (Signed)
Patient was seen at pulmonology

## 2014-07-08 NOTE — Patient Instructions (Addendum)
Prednisone 10mg  Take 4 for three days 3 for three days 2 for three days 1 for three days and stop Start Tudorza one puff twice daily Stay on Breo one puff daily Fluticasone two puff ea nostril daily Start omeprazole 20mg  before breakfast daily Stay on pepcid twice daily Pulmonary rehab referral to [facility] Overnight sleep oxygen test to be obtained Return 1 month [place]

## 2014-07-08 NOTE — Telephone Encounter (Signed)
Can we make sure this was addressed with the patient?

## 2014-07-08 NOTE — Telephone Encounter (Signed)
ATC PT. Pt not accepting messages at this time. WCB

## 2014-07-09 ENCOUNTER — Encounter: Payer: Self-pay | Admitting: Critical Care Medicine

## 2014-07-09 MED ORDER — TIOTROPIUM BROMIDE MONOHYDRATE 18 MCG IN CAPS
18.0000 ug | ORAL_CAPSULE | Freq: Every day | RESPIRATORY_TRACT | Status: DC
Start: 2014-07-09 — End: 2014-07-19

## 2014-07-09 NOTE — Assessment & Plan Note (Signed)
Copd with frequent exacerbations, not controlled on Breo alone.  Ex smoker. Gold Stage C GERD a major ppt factor Plan Prednisone 10mg  Take 4 for three days 3 for three days 2 for three days 1 for three days and stop Start Tudorza one puff twice daily Stay on Breo one puff daily Fluticasone two puff ea nostril daily Start omeprazole 20mg  before breakfast daily Stay on pepcid twice daily Reflux diet Pulmonary rehab referral to [facility] Overnight sleep oxygen test to be obtained Return 1 month [place]

## 2014-07-09 NOTE — Telephone Encounter (Signed)
lmomtcb for pt 

## 2014-07-09 NOTE — Telephone Encounter (Signed)
Needs to go back on spiriva handihaler one capsule/ two puff daily

## 2014-07-09 NOTE — Telephone Encounter (Signed)
Pt aware of recs. RX sent in. Nothing further needed 

## 2014-07-09 NOTE — Telephone Encounter (Signed)
Pt states that [name] is too expensive -- $150 month Insurance will only cover a limited amount of cost Please advise Dr Joya Gaskins of any alternatives. thanks

## 2014-07-12 ENCOUNTER — Ambulatory Visit: Payer: Medicare HMO | Admitting: Physician Assistant

## 2014-07-12 DIAGNOSIS — Z0289 Encounter for other administrative examinations: Secondary | ICD-10-CM

## 2014-07-13 ENCOUNTER — Ambulatory Visit: Payer: Self-pay | Admitting: Physician Assistant

## 2014-07-13 ENCOUNTER — Ambulatory Visit (INDEPENDENT_AMBULATORY_CARE_PROVIDER_SITE_OTHER): Payer: Medicare HMO | Admitting: Physician Assistant

## 2014-07-13 ENCOUNTER — Encounter: Payer: Self-pay | Admitting: Physician Assistant

## 2014-07-13 VITALS — BP 118/67 | HR 81 | Temp 98.2°F | Resp 18 | Ht 73.0 in | Wt 219.5 lb

## 2014-07-13 DIAGNOSIS — C61 Malignant neoplasm of prostate: Secondary | ICD-10-CM

## 2014-07-13 DIAGNOSIS — N529 Male erectile dysfunction, unspecified: Secondary | ICD-10-CM

## 2014-07-13 DIAGNOSIS — R110 Nausea: Secondary | ICD-10-CM

## 2014-07-13 DIAGNOSIS — Z23 Encounter for immunization: Secondary | ICD-10-CM

## 2014-07-13 DIAGNOSIS — N521 Erectile dysfunction due to diseases classified elsewhere: Secondary | ICD-10-CM

## 2014-07-13 MED ORDER — PROMETHAZINE HCL 12.5 MG PO TABS: 12.5000 mg | ORAL_TABLET | Freq: Three times a day (TID) | ORAL | Status: DC | PRN

## 2014-07-13 MED ORDER — SILDENAFIL CITRATE 100 MG PO TABS: 50.0000 mg | ORAL_TABLET | Freq: Every day | ORAL | Status: DC | PRN

## 2014-07-13 NOTE — Patient Instructions (Addendum)
Please continue medications as directed, making sure to take your nightly dose of Flomax and Xanax.  Also take Singulair at bedtime nightly.   I recommend that you address the prostate cancer surgery before the knee surgery as we do not want to risk the cancer spreading.  I am placing a referral to [facility] ([place] per Dr. Ericka Pontiff request. Please call [facility] to set up your appointment.  The number is [phone].  Stop by the front desk to schedule an appointment for counseling with Clint Bolder.  Please stay well hydrated.  You can take 1/2 tablet of the Viagra for desired sexual encounter.  Do not take more than 1/2 tablet in a 24 hour period.  Try to decrease your Flomax to 1 tablet once daily.  Let me know if urinary output decreases.  Follow-up in 1 month.

## 2014-07-13 NOTE — Progress Notes (Signed)
Pre visit review using our clinic review tool, if applicable. No additional management support is needed unless otherwise documented below in the visit note/SLS  

## 2014-07-13 NOTE — Progress Notes (Signed)
Patient presents to clinic today to discuss prioritizing surgeries.  Patient with prostate carcinoma in need of prostatectomy.  Followed by Dr. Jeffie Pollock.  Patient also with significant OA of knees bilaterally in need of surgical intervention per Sports Medicine MD.  Patient needing referral to [facility] to Dr. Onnie Graham. Patient is questioning which surgery to have first.   Patient would also like to attempt trial of Viagra for erectile dysfunction.  Patient no longer taking nitrates for chest pain.   Past Medical History  Diagnosis Date  . COPD (chronic obstructive pulmonary disease)   . Hypertension   . Polio   . Bronchitis   . Angina pectoris   . Tuberculosis 1984    Hosp 4 months rx , left early  . Prostate cancer   . Chicken pox   . Rheumatic fever   . Measles   . Mumps   . GERD (gastroesophageal reflux disease)   . Alcoholism     Current Outpatient Prescriptions on File Prior to Visit  Medication Sig Dispense Refill  . albuterol (PROVENTIL HFA;VENTOLIN HFA) 108 (90 BASE) MCG/ACT inhaler Inhale 1-2 puffs into the lungs every 6 (six) hours as needed for wheezing or shortness of breath. PROAIR  3 Inhaler  1  . albuterol (PROVENTIL) (2.5 MG/3ML) 0.083% nebulizer solution Take 2.5 mg by nebulization every 6 (six) hours as needed for wheezing or shortness of breath. Pt uses 2-3X daily      . ALPRAZolam (XANAX) 1 MG tablet Take 1 tablet (1 mg total) by mouth 3 (three) times daily as needed for anxiety.  90 tablet  0  . amLODipine (NORVASC) 10 MG tablet TAKE 1 TABLET (10 MG TOTAL) BY MOUTH DAILY.  90 tablet  0  . EPINEPHrine (EPIPEN) 0.3 mg/0.3 mL IJ SOAJ injection Inject 0.3 mLs (0.3 mg total) into the muscle once.  1 Device  2  . famotidine (PEPCID) 20 MG tablet Take 1 tablet (20 mg total) by mouth 2 (two) times daily.  60 tablet  1  . fluticasone (FLONASE) 50 MCG/ACT nasal spray Place 2 sprays into both nostrils daily.  16 g  12  . Fluticasone Furoate-Vilanterol (BREO ELLIPTA)  100-25 MCG/INH AEPB INHALE 1 PUFF DAILY AND RINSE MOUTH AFTER USE.  180 each  1  . loratadine (CLARITIN) 10 MG tablet TAKE 1 TABLET BY MOUTH EVERY DAY as needed      . losartan (COZAAR) 50 MG tablet TAKE 1 TABLET (50 MG TOTAL) BY MOUTH DAILY.  90 tablet  0  . montelukast (SINGULAIR) 10 MG tablet Take 10 mg by mouth at bedtime.      Marland Kitchen omeprazole (PRILOSEC) 20 MG capsule Take 1 capsule (20 mg total) by mouth daily.  30 capsule  4  . oxyCODONE-acetaminophen (PERCOCET) 10-325 MG per tablet Take 1 tablet by mouth every 8 (eight) hours as needed for pain.  120 tablet  0  . predniSONE (DELTASONE) 10 MG tablet Take 4 for three days 3 for three days 2 for three days 1 for three days and stop  30 tablet  0  . tamsulosin (FLOMAX) 0.4 MG CAPS capsule Take 0.4 mg by mouth 2 (two) times daily.       . Aclidinium Bromide (TUDORZA PRESSAIR) 400 MCG/ACT AEPB One puff twice daily  1 each  6  . tiotropium (SPIRIVA) 18 MCG inhalation capsule Place 1 capsule (18 mcg total) into inhaler and inhale daily.  30 capsule  6   No current facility-administered medications on  file prior to visit.    Allergies  Allergen Reactions  . Ivp Dye [Iodinated Diagnostic Agents] Anaphylaxis  . Penicillins Anaphylaxis    Heart stops  . Aspirin Other (See Comments)    Reaction unknown  . Morphine And Related Nausea And Vomiting  . Nsaids Other (See Comments)    Difficulty breathing    Family History  Problem Relation Age of Onset  . Alzheimer's disease Father 4    Deceased  . Stomach cancer Father   . Heart attack Father   . Heart disease Father   . Skin cancer Mother     Facial-Living  . Alcohol abuse Sister     x2  . Mental illness Sister     x2  . Diabetes Maternal Aunt     x2  . Thyroid disease Maternal Aunt     x4  . Diabetes Maternal Uncle   . Tuberculosis Paternal Grandfather   . Tuberculosis Paternal Grandmother   . Alzheimer's disease Paternal Aunt   . Alzheimer's disease Paternal Uncle     History     Social History  . Marital Status: Married    Spouse Name: N/A    Number of Children: N/A  . Years of Education: N/A   Social History Main Topics  . Smoking status: Former Smoker -- 5.00 packs/day for 47 years    Types: Pipe, Cigarettes    Quit date: 08/15/2013  . Smokeless tobacco: Never Used     Comment: Would smoke a pack of pipe tobacco(1.5oz) every 10 days.   . Alcohol Use: Yes  . Drug Use: No  . Sexual Activity: None   Other Topics Concern  . None   Social History Narrative  . None    Review of Systems - See HPI.  All other ROS are negative.  BP 118/67  Pulse 81  Temp(Src) 98.2 F (36.8 C) (Oral)  Resp 18  Ht _0  (1.854 m)  Wt 219 lb 8 oz (99.565 kg)  BMI 28.97 kg/m2  SpO2 100%  Physical Exam  Vitals reviewed. Constitutional: He is well-developed, well-nourished, and in no distress.  HENT:  Head: Normocephalic and atraumatic.  Eyes: Conjunctivae are normal.  Cardiovascular: Normal rate, regular rhythm, normal heart sounds and intact distal pulses.   Skin: Skin is warm and dry. No rash noted.  Psychiatric: Affect normal.    Recent Results (from the past 2160 hour(s))  CBC WITH DIFFERENTIAL     Status: None   Collection Time    05/15/14  2:10 PM      Result Value Ref Range   WBC 6.1  4.0 - 10.5 K/uL   RBC 4.44  4.22 - 5.81 MIL/uL   Hemoglobin 14.3  13.0 - 17.0 g/dL   HCT 41.7  39.0 - 52.0 %   MCV 93.9  78.0 - 100.0 fL   MCH 32.2  26.0 - 34.0 pg   MCHC 34.3  30.0 - 36.0 g/dL   RDW 12.4  11.5 - 15.5 %   Platelets 236  150 - 400 K/uL   Neutrophils Relative % 64  43 - 77 %   Neutro Abs 3.9  1.7 - 7.7 K/uL   Lymphocytes Relative 23  12 - 46 %   Lymphs Abs 1.4  0.7 - 4.0 K/uL   Monocytes Relative 8  3 - 12 %   Monocytes Absolute 0.5  0.1 - 1.0 K/uL   Eosinophils Relative 4  0 - 5 %   Eosinophils Absolute  0.3  0.0 - 0.7 K/uL   Basophils Relative 1  0 - 1 %   Basophils Absolute 0.0  0.0 - 0.1 K/uL  BASIC METABOLIC PANEL     Status: Abnormal    Collection Time    05/15/14  2:10 PM      Result Value Ref Range   Sodium 139  137 - 147 mEq/L   Potassium 4.6  3.7 - 5.3 mEq/L   Chloride 100  96 - 112 mEq/L   CO2 26  19 - 32 mEq/L   Glucose, Bld 123 (*) 70 - 99 mg/dL   BUN 13  6 - 23 mg/dL   Creatinine, Ser 1.20  0.50 - 1.35 mg/dL   Calcium 9.7  8.4 - 10.5 mg/dL   GFR calc non Af Amer 62 (*) >90 mL/min   GFR calc Af Amer 72 (*) >90 mL/min   Comment: (NOTE)     The eGFR has been calculated using the CKD EPI equation.     This calculation has not been validated in all clinical situations.     eGFR's persistently <90 mL/min signify possible Chronic Kidney     Disease.   Anion gap 13  5 - 15  TROPONIN I     Status: None   Collection Time    05/15/14  2:10 PM      Result Value Ref Range   Troponin I <0.30  <0.30 ng/mL   Comment:            Due to the release kinetics of cTnI,     a negative result within the first hours     of the onset of symptoms does not rule out     myocardial infarction with certainty.     If myocardial infarction is still suspected,     repeat the test at appropriate intervals.  URINALYSIS, ROUTINE W REFLEX MICROSCOPIC     Status: None   Collection Time    05/25/14 10:27 PM      Result Value Ref Range   Color, Urine YELLOW  YELLOW   APPearance CLEAR  CLEAR   Specific Gravity, Urine 1.007  1.005 - 1.030   pH 5.5  5.0 - 8.0   Glucose, UA NEGATIVE  NEGATIVE mg/dL   Hgb urine dipstick NEGATIVE  NEGATIVE   Bilirubin Urine NEGATIVE  NEGATIVE   Ketones, ur NEGATIVE  NEGATIVE mg/dL   Protein, ur NEGATIVE  NEGATIVE mg/dL   Urobilinogen, UA 0.2  0.0 - 1.0 mg/dL   Nitrite NEGATIVE  NEGATIVE   Leukocytes, UA NEGATIVE  NEGATIVE   Comment: MICROSCOPIC NOT DONE ON URINES WITH NEGATIVE PROTEIN, BLOOD, LEUKOCYTES, NITRITE, OR GLUCOSE <1000 mg/dL.  CBC WITH DIFFERENTIAL     Status: Abnormal   Collection Time    05/26/14 12:00 PM      Result Value Ref Range   WBC 6.7  4.0 - 10.5 K/uL   RBC 4.74  4.22 - 5.81  MIL/uL   Hemoglobin 15.3  13.0 - 17.0 g/dL   HCT 44.8  39.0 - 52.0 %   MCV 94.5  78.0 - 100.0 fL   MCH 32.3  26.0 - 34.0 pg   MCHC 34.2  30.0 - 36.0 g/dL   RDW 12.7  11.5 - 15.5 %   Platelets 274  150 - 400 K/uL   Neutrophils Relative % 53  43 - 77 %   Neutro Abs 3.5  1.7 - 7.7 K/uL   Lymphocytes Relative 29  12 - 46 %   Lymphs Abs 2.0  0.7 - 4.0 K/uL   Monocytes Relative 10  3 - 12 %   Monocytes Absolute 0.7  0.1 - 1.0 K/uL   Eosinophils Relative 7 (*) 0 - 5 %   Eosinophils Absolute 0.5  0.0 - 0.7 K/uL   Basophils Relative 1  0 - 1 %   Basophils Absolute 0.1  0.0 - 0.1 K/uL  COMPREHENSIVE METABOLIC PANEL     Status: Abnormal   Collection Time    05/26/14 12:00 PM      Result Value Ref Range   Sodium 139  137 - 147 mEq/L   Potassium 4.3  3.7 - 5.3 mEq/L   Chloride 101  96 - 112 mEq/L   CO2 24  19 - 32 mEq/L   Glucose, Bld 108 (*) 70 - 99 mg/dL   BUN 15  6 - 23 mg/dL   Creatinine, Ser 1.20  0.50 - 1.35 mg/dL   Calcium 10.1  8.4 - 10.5 mg/dL   Total Protein 7.9  6.0 - 8.3 g/dL   Albumin 4.4  3.5 - 5.2 g/dL   AST 31  0 - 37 U/L   ALT 35  0 - 53 U/L   Alkaline Phosphatase 68  39 - 117 U/L   Total Bilirubin 0.6  0.3 - 1.2 mg/dL   GFR calc non Af Amer 62 (*) >90 mL/min   GFR calc Af Amer 72 (*) >90 mL/min   Comment: (NOTE)     The eGFR has been calculated using the CKD EPI equation.     This calculation has not been validated in all clinical situations.     eGFR's persistently <90 mL/min signify possible Chronic Kidney     Disease.   Anion gap 14  5 - 15  TROPONIN I     Status: None   Collection Time    05/26/14 12:00 PM      Result Value Ref Range   Troponin I <0.30  <0.30 ng/mL   Comment:            Due to the release kinetics of cTnI,     a negative result within the first hours     of the onset of symptoms does not rule out     myocardial infarction with certainty.     If myocardial infarction is still suspected,     repeat the test at appropriate intervals.    URINE CULTURE     Status: None   Collection Time    05/26/14  1:30 PM      Result Value Ref Range   Specimen Description URINE, CATHETERIZED     Special Requests NONE     Culture  Setup Time       Value: 05/27/2014 14:16     Performed at [facility]       Value: NO GROWTH     Performed at [facility]   Culture       Value: NO GROWTH     Performed at [facility]   Report Status 05/28/2014 FINAL    URINALYSIS, ROUTINE W REFLEX MICROSCOPIC     Status: Abnormal   Collection Time    06/29/14  1:18 PM      Result Value Ref Range   Color, Urine YELLOW  Yellow;Lt. Yellow   APPearance CLEAR  Clear   Specific Gravity, Urine 1.015  1.000-1.030   pH  5.5  5.0 - 8.0   Total Protein, Urine NEGATIVE  Negative   Urine Glucose NEGATIVE  Negative   Ketones, ur NEGATIVE  Negative   Bilirubin Urine NEGATIVE  Negative   Hgb urine dipstick NEGATIVE  Negative   Urobilinogen, UA 0.2  0.0 - 1.0   Leukocytes, UA NEGATIVE  Negative   Nitrite NEGATIVE  Negative   WBC, UA 3-6/hpf (*) 0-2/hpf   RBC / HPF none seen  0-2/hpf   Squamous Epithelial / LPF Rare(0-4/hpf)  Rare(0-4/hpf)  URINE CULTURE     Status: None   Collection Time    06/29/14  1:45 PM      Result Value Ref Range   Colony Count NO GROWTH     Organism ID, Bacteria NO GROWTH    CBC WITH DIFFERENTIAL     Status: Abnormal   Collection Time    07/01/14  9:25 PM      Result Value Ref Range   WBC 7.3  4.0 - 10.5 K/uL   RBC 4.27  4.22 - 5.81 MIL/uL   Hemoglobin 13.7  13.0 - 17.0 g/dL   HCT 39.5  39.0 - 52.0 %   MCV 92.5  78.0 - 100.0 fL   MCH 32.1  26.0 - 34.0 pg   MCHC 34.7  30.0 - 36.0 g/dL   RDW 13.0  11.5 - 15.5 %   Platelets 213  150 - 400 K/uL   Neutrophils Relative % 63  43 - 77 %   Neutro Abs 4.6  1.7 - 7.7 K/uL   Lymphocytes Relative 22  12 - 46 %   Lymphs Abs 1.6  0.7 - 4.0 K/uL   Monocytes Relative 8  3 - 12 %   Monocytes Absolute 0.6  0.1 - 1.0 K/uL   Eosinophils Relative 7 (*)  0 - 5 %   Eosinophils Absolute 0.5  0.0 - 0.7 K/uL   Basophils Relative 0  0 - 1 %   Basophils Absolute 0.0  0.0 - 0.1 K/uL  COMPREHENSIVE METABOLIC PANEL     Status: Abnormal   Collection Time    07/01/14  9:25 PM      Result Value Ref Range   Sodium 137  137 - 147 mEq/L   Potassium 4.5  3.7 - 5.3 mEq/L   Chloride 97  96 - 112 mEq/L   CO2 27  19 - 32 mEq/L   Glucose, Bld 122 (*) 70 - 99 mg/dL   BUN 15  6 - 23 mg/dL   Creatinine, Ser 1.40 (*) 0.50 - 1.35 mg/dL   Calcium 9.4  8.4 - 10.5 mg/dL   Total Protein 7.4  6.0 - 8.3 g/dL   Albumin 3.9  3.5 - 5.2 g/dL   AST 20  0 - 37 U/L   ALT 25  0 - 53 U/L   Alkaline Phosphatase 63  39 - 117 U/L   Total Bilirubin 0.6  0.3 - 1.2 mg/dL   GFR calc non Af Amer 52 (*) >90 mL/min   GFR calc Af Amer 60 (*) >90 mL/min   Comment: (NOTE)     The eGFR has been calculated using the CKD EPI equation.     This calculation has not been validated in all clinical situations.     eGFR's persistently <90 mL/min signify possible Chronic Kidney     Disease.   Anion gap 13  5 - 15  URINALYSIS, ROUTINE W REFLEX MICROSCOPIC     Status: None  Collection Time    07/01/14  9:37 PM      Result Value Ref Range   Color, Urine YELLOW  YELLOW   APPearance CLEAR  CLEAR   Specific Gravity, Urine 1.006  1.005 - 1.030   pH 7.0  5.0 - 8.0   Glucose, UA NEGATIVE  NEGATIVE mg/dL   Hgb urine dipstick NEGATIVE  NEGATIVE   Bilirubin Urine NEGATIVE  NEGATIVE   Ketones, ur NEGATIVE  NEGATIVE mg/dL   Protein, ur NEGATIVE  NEGATIVE mg/dL   Urobilinogen, UA 0.2  0.0 - 1.0 mg/dL   Nitrite NEGATIVE  NEGATIVE   Leukocytes, UA NEGATIVE  NEGATIVE   Comment: MICROSCOPIC NOT DONE ON URINES WITH NEGATIVE PROTEIN, BLOOD, LEUKOCYTES, NITRITE, OR GLUCOSE <1000 mg/dL.    Assessment/Plan: Prostate cancer Discussed with patient that Prostate Cancer treatment is the priority at this point. Follow-up with Dr. Jeffie Pollock as scheduled for surgical consult.   Erectile dysfunction Rx trial  of Viagra. ADRs discussed with patient.  Stop medication and call if these occur.  Follow-up in 1 month -- can also follow-up with Urology regarding this.

## 2014-07-14 ENCOUNTER — Encounter: Payer: Self-pay | Admitting: Internal Medicine

## 2014-07-14 DIAGNOSIS — N390 Urinary tract infection, site not specified: Secondary | ICD-10-CM | POA: Insufficient documentation

## 2014-07-14 DIAGNOSIS — Z23 Encounter for immunization: Secondary | ICD-10-CM

## 2014-07-14 NOTE — Assessment & Plan Note (Signed)
Discussed with patient that Prostate Cancer treatment is the priority at this point. Follow-up with Dr. Jeffie Pollock as scheduled for surgical consult.

## 2014-07-14 NOTE — Assessment & Plan Note (Signed)
Paitent currently being treated.  Repeat dip negative for infection.  Will send for culture.  Will also send for cytology giving patient's claim about possible bladder carcinoma.  Continue other medications as directed.  Follow up with urology in 1 week.

## 2014-07-14 NOTE — Assessment & Plan Note (Signed)
Rx trial of Viagra. ADRs discussed with patient.  Stop medication and call if these occur.  Follow-up in 1 month -- can also follow-up with Urology regarding this.

## 2014-07-16 ENCOUNTER — Ambulatory Visit: Payer: Self-pay

## 2014-07-17 ENCOUNTER — Ambulatory Visit (HOSPITAL_BASED_OUTPATIENT_CLINIC_OR_DEPARTMENT_OTHER)
Admission: RE | Admit: 2014-07-17 | Discharge: 2014-07-17 | Disposition: A | Discharge status: Home / Self Care | Payer: Medicare HMO | Source: Ambulatory Visit | Attending: Family Medicine | Admitting: Family Medicine

## 2014-07-17 DIAGNOSIS — M25562 Pain in left knee: Secondary | ICD-10-CM | POA: Diagnosis not present

## 2014-07-17 DIAGNOSIS — M7122 Synovial cyst of popliteal space [Baker], left knee: Secondary | ICD-10-CM | POA: Insufficient documentation

## 2014-07-19 ENCOUNTER — Other Ambulatory Visit: Payer: Self-pay | Admitting: Critical Care Medicine

## 2014-07-19 ENCOUNTER — Telehealth: Payer: Self-pay | Admitting: Critical Care Medicine

## 2014-07-19 DIAGNOSIS — J449 Chronic obstructive pulmonary disease, unspecified: Secondary | ICD-10-CM

## 2014-07-19 DIAGNOSIS — J42 Unspecified chronic bronchitis: Secondary | ICD-10-CM

## 2014-07-19 MED ORDER — TIOTROPIUM BROMIDE MONOHYDRATE 18 MCG IN CAPS: 18.0000 ug | ORAL_CAPSULE | Freq: Every day | RESPIRATORY_TRACT | Status: DC

## 2014-07-19 NOTE — Telephone Encounter (Signed)
Patient returning call.

## 2014-07-19 NOTE — Telephone Encounter (Signed)
Spoke with and gave him results of ONO.  Pt states that he could not sleep the whole night while test was going on.  He reports being up and down with acid reflux all night.  Pt wants to know if test needs to be repeated because he really feels like he needs his oxygen on some nights.  Please advise

## 2014-07-19 NOTE — Telephone Encounter (Signed)
ONO on RA was NORMAL No oxygen needed

## 2014-07-19 NOTE — Telephone Encounter (Signed)
Spoke with the pt and notified of recs per PW  He verbalized understanding  I have refilled the spiriva for 90 days supply per pt request  Nothing further needed

## 2014-07-19 NOTE — Telephone Encounter (Signed)
[email or web address] on pt's home and cell #s

## 2014-07-19 NOTE — Telephone Encounter (Signed)
i am ok with repeat.  Tell pt to take 40mg  pepcid prior to bedtime each night

## 2014-07-20 ENCOUNTER — Ambulatory Visit: Payer: Self-pay | Admitting: Family Medicine

## 2014-07-20 NOTE — Telephone Encounter (Signed)
Spiriva rx sent through Phone Msg.

## 2014-07-21 ENCOUNTER — Ambulatory Visit (INDEPENDENT_AMBULATORY_CARE_PROVIDER_SITE_OTHER): Payer: Medicare HMO | Admitting: Family Medicine

## 2014-07-21 ENCOUNTER — Encounter: Payer: Self-pay | Admitting: Family Medicine

## 2014-07-21 VITALS — BP 144/78 | HR 98 | Ht 73.0 in | Wt 214.0 lb

## 2014-07-21 DIAGNOSIS — M25561 Pain in right knee: Secondary | ICD-10-CM

## 2014-07-21 DIAGNOSIS — M25511 Pain in right shoulder: Secondary | ICD-10-CM

## 2014-07-21 DIAGNOSIS — M25562 Pain in left knee: Secondary | ICD-10-CM

## 2014-07-21 MED ORDER — METHYLPREDNISOLONE ACETATE 40 MG/ML IJ SUSP
40.0000 mg | Freq: Once | INTRAMUSCULAR | Status: AC
  Administered 2014-07-21: 40 mg via INTRA_ARTICULAR

## 2014-07-21 NOTE — Patient Instructions (Signed)
Your right knee pain is due to patellar subluxation. Wear knee immobilizer or brace for next 2-3 weeks. Icing 15 minutes at a time 3-4 times a day. Ibuprofen or aleve as needed for pain and inflammation. Follow up with me in 2-3 weeks for this issue. Typically we add strengthening exercises after this.

## 2014-07-23 ENCOUNTER — Ambulatory Visit: Payer: Self-pay | Admitting: Psychology

## 2014-07-26 ENCOUNTER — Telehealth: Payer: Self-pay | Admitting: Physician Assistant

## 2014-07-26 ENCOUNTER — Other Ambulatory Visit: Payer: Self-pay | Admitting: Physician Assistant

## 2014-07-26 NOTE — Telephone Encounter (Signed)
Please Advise

## 2014-07-26 NOTE — Telephone Encounter (Signed)
Rx request to pharmacy/SLS  

## 2014-07-26 NOTE — Telephone Encounter (Signed)
LMOM with contact name and number for return call RE: more Information needed on Insurance and further provider instructions on continuing current medication regimen/SLS

## 2014-07-26 NOTE — Telephone Encounter (Signed)
Caller name: Gaetan  Relation to pt: self  Call back number: [phone]   Reason for call:   patient stated [employer] called inquiring about the status of patient blood pressure while being on losartan (COZAAR). Pt responded its "ok" they advised pt to stop taking medication and they will advise the pharmacy and another blood pressure medication  will be sent to pharmacy. Pt in need of clarification.

## 2014-07-26 NOTE — Telephone Encounter (Signed)
I would recommend the patient continue the current medication regimen as it is controlling his BP well.  Is the insurance company not covering the particular information anymore?  I need more info.  Please call patient to see what's going on.

## 2014-07-27 ENCOUNTER — Encounter: Payer: Self-pay | Admitting: Family Medicine

## 2014-07-27 DIAGNOSIS — M25561 Pain in right knee: Secondary | ICD-10-CM | POA: Insufficient documentation

## 2014-07-27 NOTE — Assessment & Plan Note (Signed)
Radiographs negative - MRI showed only mild patellofemoral DJD with a baker's cyst.  Went ahead with intraarticular injection today.    After informed written consent, patient was seated on exam table. Left knee was prepped with alcohol swab and utilizing anteromedial approach, patient's left knee was injected intraarticularly with 3:1 marcaine: depomedrol. Patient tolerated the procedure well without immediate complications.

## 2014-07-27 NOTE — Assessment & Plan Note (Signed)
2/2 patellar subluxation.  Knee immobilizer for 2-3 weeks.  Icing, nsaids.  F/u with me at that time.  Plan to add PT or home exercises at that time.

## 2014-07-27 NOTE — Progress Notes (Signed)
Patient ID: [name], male   DOB: August 13, 1951, 63 y.o.   MRN: [id number]  PCP: Leeanne Rio, PA-C  Subjective:   HPI: Patient is a 63 y.o. male here for left knee pain.  9/15: Patient reports for about 8 months he has had medial left knee pain. Associated popping, buckling, and catching. No known injury. No swelling or bruising. Worse with twisting motions. Weight bearing radiographs in May showed only patellar spurring. Been treated by PCP since May for this - not improving.  10/7: Patient comes in to get injection left knee but also because right knee bothering him more now. Had polio in right leg over 60 years ago leaving right leg weaker. Believes right knee popped out and back in on Sunday. Associated with swelling and some pain. Using biofreeze. Feels weaker. No catching or locking.  Past Medical History  Diagnosis Date  . COPD (chronic obstructive pulmonary disease)   . Hypertension   . Polio   . Bronchitis   . Angina pectoris   . Tuberculosis 1984    Hosp 4 months rx , left early  . Prostate cancer   . Chicken pox   . Rheumatic fever   . Measles   . Mumps   . GERD (gastroesophageal reflux disease)   . Alcoholism     Current Outpatient Prescriptions on File Prior to Visit  Medication Sig Dispense Refill  . Aclidinium Bromide (TUDORZA PRESSAIR) 400 MCG/ACT AEPB One puff twice daily  1 each  6  . albuterol (PROVENTIL HFA;VENTOLIN HFA) 108 (90 BASE) MCG/ACT inhaler Inhale 1-2 puffs into the lungs every 6 (six) hours as needed for wheezing or shortness of breath. PROAIR  3 Inhaler  1  . albuterol (PROVENTIL) (2.5 MG/3ML) 0.083% nebulizer solution Take 2.5 mg by nebulization every 6 (six) hours as needed for wheezing or shortness of breath. Pt uses 2-3X daily      . ALPRAZolam (XANAX) 1 MG tablet Take 1 tablet (1 mg total) by mouth 3 (three) times daily as needed for anxiety.  90 tablet  0  . EPINEPHrine (EPIPEN) 0.3 mg/0.3 mL IJ SOAJ injection Inject  0.3 mLs (0.3 mg total) into the muscle once.  1 Device  2  . famotidine (PEPCID) 20 MG tablet Take 1 tablet (20 mg total) by mouth 2 (two) times daily.  60 tablet  1  . fluticasone (FLONASE) 50 MCG/ACT nasal spray Place 2 sprays into both nostrils daily.  16 g  12  . Fluticasone Furoate-Vilanterol (BREO ELLIPTA) 100-25 MCG/INH AEPB INHALE 1 PUFF DAILY AND RINSE MOUTH AFTER USE.  180 each  1  . loratadine (CLARITIN) 10 MG tablet TAKE 1 TABLET BY MOUTH EVERY DAY as needed      . losartan (COZAAR) 50 MG tablet TAKE 1 TABLET (50 MG TOTAL) BY MOUTH DAILY.  90 tablet  0  . montelukast (SINGULAIR) 10 MG tablet Take 10 mg by mouth at bedtime.      Marland Kitchen omeprazole (PRILOSEC) 20 MG capsule Take 1 capsule (20 mg total) by mouth daily.  30 capsule  4  . oxyCODONE-acetaminophen (PERCOCET) 10-325 MG per tablet Take 1 tablet by mouth every 8 (eight) hours as needed for pain.  120 tablet  0  . predniSONE (DELTASONE) 10 MG tablet Take 4 for three days 3 for three days 2 for three days 1 for three days and stop  30 tablet  0  . promethazine (PHENERGAN) 12.5 MG tablet Take 1 tablet (12.5 mg total) by  mouth every 8 (eight) hours as needed for nausea or vomiting.  30 tablet  0  . sildenafil (VIAGRA) 100 MG tablet Take 0.5 tablets (50 mg total) by mouth daily as needed for erectile dysfunction.  6 tablet  2  . tamsulosin (FLOMAX) 0.4 MG CAPS capsule Take 0.4 mg by mouth 2 (two) times daily.       Marland Kitchen tiotropium (SPIRIVA) 18 MCG inhalation capsule Place 1 capsule (18 mcg total) into inhaler and inhale daily.  90 capsule  1   No current facility-administered medications on file prior to visit.    Past Surgical History  Procedure Laterality Date  . Rotator cuff repair    . Other surgical history       Muscle & bone Graft/Polio  . Cholecystectomy    . Nose surgery    . Nasal septum surgery    . Kidney stone surgery    . Hand surgery    . Shoulder surgery      Arthritis & Bone Spurs  . Dental surgery      Allergies   Allergen Reactions  . Ivp Dye [Iodinated Diagnostic Agents] Anaphylaxis  . Penicillins Anaphylaxis    Heart stops  . Aspirin Other (See Comments)    Reaction unknown  . Morphine And Related Nausea And Vomiting  . Nsaids Other (See Comments)    Difficulty breathing    History   Social History  . Marital Status: Married    Spouse Name: N/A    Number of Children: N/A  . Years of Education: N/A   Occupational History  . Not on file.   Social History Main Topics  . Smoking status: Former Smoker -- 5.00 packs/day for 47 years    Types: Pipe, Cigarettes    Quit date: 08/15/2013  . Smokeless tobacco: Never Used     Comment: Would smoke a pack of pipe tobacco(1.5oz) every 10 days.   . Alcohol Use: Yes  . Drug Use: No  . Sexual Activity: Not on file   Other Topics Concern  . Not on file   Social History Narrative  . No narrative on file    Family History  Problem Relation Age of Onset  . Alzheimer's disease Father 4    Deceased  . Stomach cancer Father   . Heart attack Father   . Heart disease Father   . Skin cancer Mother     Facial-Living  . Alcohol abuse Sister     x2  . Mental illness Sister     x2  . Diabetes Maternal Aunt     x2  . Thyroid disease Maternal Aunt     x4  . Diabetes Maternal Uncle   . Tuberculosis Paternal Grandfather   . Tuberculosis Paternal Grandmother   . Alzheimer's disease Paternal Aunt   . Alzheimer's disease Paternal Uncle     BP 144/78  Pulse 98  Ht 6\' 1"  (1.854 m)  Wt 214 lb (97.07 kg)  BMI 28.24 kg/m2  Review of Systems: See HPI above.    Objective:  Physical Exam:  Gen: NAD  Left knee: No gross deformity, ecchymoses, effusion. TTP medial joint line. FROM. Negative ant/post drawers. Negative valgus/varus testing. Negative lachmanns. Positive medial mcmurrays, apleys.  Negative patellar apprehension, clarkes. NV intact distally.  Right knee: Significant atrophy of all leg musculature. Effusion noted.  No  other deformity. Mild TTP diffusely anteriorly ROM 0-90 degrees. Negative ant/post drawers. Negative valgus/varus testing. Negative lachmanns. Positive apprehension.  Negative mcmurrays, apleys.  NV intact distally.    Assessment & Plan:  1. Left knee pain - Radiographs negative - MRI showed only mild patellofemoral DJD with a baker's cyst.  Went ahead with intraarticular injection today.    After informed written consent, patient was seated on exam table. Left knee was prepped with alcohol swab and utilizing anteromedial approach, patient's left knee was injected intraarticularly with 3:1 marcaine: depomedrol. Patient tolerated the procedure well without immediate complications.  2. Right knee pain - 2/2 patellar subluxation.  Knee immobilizer for 2-3 weeks.  Icing, nsaids.  F/u with me at that time.  Plan to add PT or home exercises at that time.

## 2014-07-29 ENCOUNTER — Ambulatory Visit (INDEPENDENT_AMBULATORY_CARE_PROVIDER_SITE_OTHER): Payer: Medicare HMO | Admitting: Family Medicine

## 2014-07-29 ENCOUNTER — Encounter: Payer: Self-pay | Admitting: Family Medicine

## 2014-07-29 VITALS — BP 141/75 | HR 76 | Ht 73.0 in | Wt 214.0 lb

## 2014-07-29 DIAGNOSIS — M25561 Pain in right knee: Secondary | ICD-10-CM

## 2014-07-29 DIAGNOSIS — M25562 Pain in left knee: Secondary | ICD-10-CM

## 2014-07-29 MED ORDER — METHYLPREDNISOLONE ACETATE 40 MG/ML IJ SUSP
40.0000 mg | Freq: Once | INTRAMUSCULAR | Status: AC
  Administered 2014-07-29: 40 mg via INTRA_ARTICULAR

## 2014-07-30 ENCOUNTER — Other Ambulatory Visit: Payer: Self-pay | Admitting: Urology

## 2014-07-30 ENCOUNTER — Telehealth (HOSPITAL_COMMUNITY): Payer: Self-pay

## 2014-07-30 DIAGNOSIS — C61 Malignant neoplasm of prostate: Secondary | ICD-10-CM

## 2014-07-30 NOTE — Telephone Encounter (Signed)
I have called and left a message with Kailan to inquire about participation in Pulmonary Rehab. Will send letter in mail and follow up.

## 2014-08-02 ENCOUNTER — Encounter: Payer: Self-pay | Admitting: Family Medicine

## 2014-08-02 NOTE — Assessment & Plan Note (Signed)
Radiographs negative - MRI showed only mild patellofemoral DJD with a baker's cyst.  Improved since intraarticular injection but still with 5/10 level of pain.  Will monitor.  Knee brace for support.

## 2014-08-02 NOTE — Assessment & Plan Note (Signed)
2/2 presumed patellar subluxation.  Not wearing immobilizer today.  For pain relief was given intraarticular injection.  Icing, nsaids.  Keep appointment in 2 weeks for f/u.  Consider adding PT at that time.  After informed written consent, patient was seated on exam table. Right knee was prepped with alcohol swab and utilizing anteromedial approach, patient's right knee was injected intraarticularly with 3:1 marcaine: depomedrol. Patient tolerated the procedure well without immediate complications.

## 2014-08-02 NOTE — Progress Notes (Signed)
Patient ID: [name], male   DOB: 09-24-1951, 63 y.o.   MRN: [id number]  PCP: Leeanne Rio, PA-C  Subjective:   HPI: Patient is a 63 y.o. male here for bilateral knee pain.  9/15: Patient reports for about 8 months he has had medial left knee pain. Associated popping, buckling, and catching. No known injury. No swelling or bruising. Worse with twisting motions. Weight bearing radiographs in May showed only patellar spurring. Been treated by PCP since May for this - not improving.  10/7: Patient comes in to get injection left knee but also because right knee bothering him more now. Had polio in right leg over 60 years ago leaving right leg weaker. Believes right knee popped out and back in on Sunday. Associated with swelling and some pain. Using biofreeze. Feels weaker. No catching or locking.  10/15: Patient reports left knee is improved, down to 5/10 pain but right is now worse at 10/10 level. Using knee immobilizer. Would like a knee brace for his left knee as well.  Past Medical History  Diagnosis Date  . COPD (chronic obstructive pulmonary disease)   . Hypertension   . Polio   . Bronchitis   . Angina pectoris   . Tuberculosis 1984    Hosp 4 months rx , left early  . Prostate cancer   . Chicken pox   . Rheumatic fever   . Measles   . Mumps   . GERD (gastroesophageal reflux disease)   . Alcoholism     Current Outpatient Prescriptions on File Prior to Visit  Medication Sig Dispense Refill  . Aclidinium Bromide (TUDORZA PRESSAIR) 400 MCG/ACT AEPB One puff twice daily  1 each  6  . albuterol (PROVENTIL HFA;VENTOLIN HFA) 108 (90 BASE) MCG/ACT inhaler Inhale 1-2 puffs into the lungs every 6 (six) hours as needed for wheezing or shortness of breath. PROAIR  3 Inhaler  1  . albuterol (PROVENTIL) (2.5 MG/3ML) 0.083% nebulizer solution Take 2.5 mg by nebulization every 6 (six) hours as needed for wheezing or shortness of breath. Pt uses 2-3X daily      .  ALPRAZolam (XANAX) 1 MG tablet Take 1 tablet (1 mg total) by mouth 3 (three) times daily as needed for anxiety.  90 tablet  0  . amLODipine (NORVASC) 10 MG tablet TAKE 1 TABLET (10 MG TOTAL) BY MOUTH DAILY.  90 tablet  0  . EPINEPHrine (EPIPEN) 0.3 mg/0.3 mL IJ SOAJ injection Inject 0.3 mLs (0.3 mg total) into the muscle once.  1 Device  2  . famotidine (PEPCID) 20 MG tablet Take 1 tablet (20 mg total) by mouth 2 (two) times daily.  60 tablet  1  . fluticasone (FLONASE) 50 MCG/ACT nasal spray Place 2 sprays into both nostrils daily.  16 g  12  . Fluticasone Furoate-Vilanterol (BREO ELLIPTA) 100-25 MCG/INH AEPB INHALE 1 PUFF DAILY AND RINSE MOUTH AFTER USE.  180 each  1  . loratadine (CLARITIN) 10 MG tablet TAKE 1 TABLET BY MOUTH EVERY DAY as needed      . losartan (COZAAR) 50 MG tablet TAKE 1 TABLET (50 MG TOTAL) BY MOUTH DAILY.  90 tablet  0  . montelukast (SINGULAIR) 10 MG tablet Take 10 mg by mouth at bedtime.      Marland Kitchen omeprazole (PRILOSEC) 20 MG capsule Take 1 capsule (20 mg total) by mouth daily.  30 capsule  4  . oxyCODONE-acetaminophen (PERCOCET) 10-325 MG per tablet Take 1 tablet by mouth every 8 (eight)  hours as needed for pain.  120 tablet  0  . predniSONE (DELTASONE) 10 MG tablet Take 4 for three days 3 for three days 2 for three days 1 for three days and stop  30 tablet  0  . promethazine (PHENERGAN) 12.5 MG tablet Take 1 tablet (12.5 mg total) by mouth every 8 (eight) hours as needed for nausea or vomiting.  30 tablet  0  . sildenafil (VIAGRA) 100 MG tablet Take 0.5 tablets (50 mg total) by mouth daily as needed for erectile dysfunction.  6 tablet  2  . tamsulosin (FLOMAX) 0.4 MG CAPS capsule Take 0.4 mg by mouth 2 (two) times daily.       Marland Kitchen tiotropium (SPIRIVA) 18 MCG inhalation capsule Place 1 capsule (18 mcg total) into inhaler and inhale daily.  90 capsule  1   No current facility-administered medications on file prior to visit.    Past Surgical History  Procedure Laterality Date   . Rotator cuff repair    . Other surgical history       Muscle & bone Graft/Polio  . Cholecystectomy    . Nose surgery    . Nasal septum surgery    . Kidney stone surgery    . Hand surgery    . Shoulder surgery      Arthritis & Bone Spurs  . Dental surgery      Allergies  Allergen Reactions  . Ivp Dye [Iodinated Diagnostic Agents] Anaphylaxis  . Penicillins Anaphylaxis    Heart stops  . Aspirin Other (See Comments)    Reaction unknown  . Morphine And Related Nausea And Vomiting  . Nsaids Other (See Comments)    Difficulty breathing    History   Social History  . Marital Status: Married    Spouse Name: N/A    Number of Children: N/A  . Years of Education: N/A   Occupational History  . Not on file.   Social History Main Topics  . Smoking status: Former Smoker -- 5.00 packs/day for 47 years    Types: Pipe, Cigarettes    Quit date: 08/15/2013  . Smokeless tobacco: Never Used     Comment: Would smoke a pack of pipe tobacco(1.5oz) every 10 days.   . Alcohol Use: Yes  . Drug Use: No  . Sexual Activity: Not on file   Other Topics Concern  . Not on file   Social History Narrative  . No narrative on file    Family History  Problem Relation Age of Onset  . Alzheimer's disease Father 4    Deceased  . Stomach cancer Father   . Heart attack Father   . Heart disease Father   . Skin cancer Mother     Facial-Living  . Alcohol abuse Sister     x2  . Mental illness Sister     x2  . Diabetes Maternal Aunt     x2  . Thyroid disease Maternal Aunt     x4  . Diabetes Maternal Uncle   . Tuberculosis Paternal Grandfather   . Tuberculosis Paternal Grandmother   . Alzheimer's disease Paternal Aunt   . Alzheimer's disease Paternal Uncle     BP 141/75  Pulse 76  Ht 6\' 1"  (1.854 m)  Wt 214 lb (97.07 kg)  BMI 28.24 kg/m2  Review of Systems: See HPI above.    Objective:  Physical Exam:  Gen: NAD  Left knee: No gross deformity, ecchymoses, effusion. No TTP  medial joint line. FROM.  Negative ant/post drawers. Negative valgus/varus testing. Negative lachmanns. Negative medial mcmurrays, apleys.  Negative patellar apprehension, clarkes. NV intact distally.  Right knee: Significant atrophy of all leg musculature. Mild effusion noted.  + warmth.  No other deformity. Mild TTP diffusely anteriorly ROM 0-100 degrees. Negative ant/post drawers. Negative valgus/varus testing. Negative lachmanns. Positive apprehension.  Negative mcmurrays, apleys. NV intact distally.    Assessment & Plan:  1. Left knee pain - Radiographs negative - MRI showed only mild patellofemoral DJD with a baker's cyst.  Improved since intraarticular injection but still with 5/10 level of pain.  Will monitor.  Knee brace for support.  2. Right knee pain - 2/2 presumed patellar subluxation.  Not wearing immobilizer today.  For pain relief was given intraarticular injection.  Icing, nsaids.  Keep appointment in 2 weeks for f/u.  Consider adding PT at that time.  After informed written consent, patient was seated on exam table. Right knee was prepped with alcohol swab and utilizing anteromedial approach, patient's right knee was injected intraarticularly with 3:1 marcaine: depomedrol. Patient tolerated the procedure well without immediate complications.

## 2014-08-05 ENCOUNTER — Other Ambulatory Visit: Payer: Self-pay | Admitting: Critical Care Medicine

## 2014-08-05 ENCOUNTER — Encounter (HOSPITAL_BASED_OUTPATIENT_CLINIC_OR_DEPARTMENT_OTHER): Payer: Self-pay | Admitting: Emergency Medicine

## 2014-08-05 ENCOUNTER — Other Ambulatory Visit: Payer: Self-pay | Admitting: Physician Assistant

## 2014-08-05 ENCOUNTER — Emergency Department (HOSPITAL_BASED_OUTPATIENT_CLINIC_OR_DEPARTMENT_OTHER)
Admission: EM | Admit: 2014-08-05 | Discharge: 2014-08-05 | Disposition: A | Discharge status: Home / Self Care | Payer: Medicare HMO | Attending: Emergency Medicine | Admitting: Emergency Medicine

## 2014-08-05 ENCOUNTER — Ambulatory Visit: Payer: Self-pay | Admitting: Critical Care Medicine

## 2014-08-05 DIAGNOSIS — F419 Anxiety disorder, unspecified: Secondary | ICD-10-CM | POA: Diagnosis not present

## 2014-08-05 DIAGNOSIS — Z880 Allergy status to penicillin: Secondary | ICD-10-CM | POA: Insufficient documentation

## 2014-08-05 DIAGNOSIS — K219 Gastro-esophageal reflux disease without esophagitis: Secondary | ICD-10-CM | POA: Diagnosis not present

## 2014-08-05 DIAGNOSIS — J449 Chronic obstructive pulmonary disease, unspecified: Secondary | ICD-10-CM | POA: Diagnosis not present

## 2014-08-05 DIAGNOSIS — R10817 Generalized abdominal tenderness: Secondary | ICD-10-CM | POA: Diagnosis not present

## 2014-08-05 DIAGNOSIS — Z7952 Long term (current) use of systemic steroids: Secondary | ICD-10-CM | POA: Insufficient documentation

## 2014-08-05 DIAGNOSIS — Z87891 Personal history of nicotine dependence: Secondary | ICD-10-CM | POA: Diagnosis not present

## 2014-08-05 DIAGNOSIS — R110 Nausea: Secondary | ICD-10-CM | POA: Insufficient documentation

## 2014-08-05 DIAGNOSIS — Z8611 Personal history of tuberculosis: Secondary | ICD-10-CM | POA: Diagnosis not present

## 2014-08-05 DIAGNOSIS — R682 Dry mouth, unspecified: Secondary | ICD-10-CM | POA: Diagnosis not present

## 2014-08-05 DIAGNOSIS — Z8619 Personal history of other infectious and parasitic diseases: Secondary | ICD-10-CM | POA: Insufficient documentation

## 2014-08-05 DIAGNOSIS — Z79899 Other long term (current) drug therapy: Secondary | ICD-10-CM | POA: Diagnosis not present

## 2014-08-05 DIAGNOSIS — Z8546 Personal history of malignant neoplasm of prostate: Secondary | ICD-10-CM | POA: Diagnosis not present

## 2014-08-05 DIAGNOSIS — I10 Essential (primary) hypertension: Secondary | ICD-10-CM | POA: Insufficient documentation

## 2014-08-05 DIAGNOSIS — K130 Diseases of lips: Secondary | ICD-10-CM

## 2014-08-05 DIAGNOSIS — E860 Dehydration: Secondary | ICD-10-CM | POA: Diagnosis present

## 2014-08-05 DIAGNOSIS — R338 Other retention of urine: Secondary | ICD-10-CM | POA: Diagnosis not present

## 2014-08-05 LAB — COMPREHENSIVE METABOLIC PANEL
ALBUMIN: 4.1 g/dL (ref 3.5–5.2)
ALK PHOS: 68 U/L (ref 39–117)
ALT: 24 U/L (ref 0–53)
AST: 22 U/L (ref 0–37)
Anion gap: 16 — ABNORMAL HIGH (ref 5–15)
BUN: 16 mg/dL (ref 6–23)
CALCIUM: 9.7 mg/dL (ref 8.4–10.5)
CO2: 25 mEq/L (ref 19–32)
Chloride: 96 mEq/L (ref 96–112)
Creatinine, Ser: 1.1 mg/dL (ref 0.50–1.35)
GFR calc non Af Amer: 69 mL/min — ABNORMAL LOW (ref >90)
GFR, EST AFRICAN AMERICAN: 80 mL/min — AB (ref >90)
GLUCOSE: 100 mg/dL — AB (ref 70–99)
Potassium: 4 mEq/L (ref 3.7–5.3)
SODIUM: 137 meq/L (ref 137–147)
Total Bilirubin: 0.4 mg/dL (ref 0.3–1.2)
Total Protein: 7.5 g/dL (ref 6.0–8.3)

## 2014-08-05 LAB — CBC WITH DIFFERENTIAL/PLATELET
Basophils Absolute: 0 10*3/uL (ref 0.0–0.1)
Basophils Relative: 0 % (ref 0–1)
EOS ABS: 0.2 10*3/uL (ref 0.0–0.7)
Eosinophils Relative: 3 % (ref 0–5)
HCT: 39.2 % (ref 39.0–52.0)
Hemoglobin: 13.1 g/dL (ref 13.0–17.0)
Lymphocytes Relative: 19 % (ref 12–46)
Lymphs Abs: 1.1 10*3/uL (ref 0.7–4.0)
MCH: 31.2 pg (ref 26.0–34.0)
MCHC: 33.4 g/dL (ref 30.0–36.0)
MCV: 93.3 fL (ref 78.0–100.0)
Monocytes Absolute: 0.4 10*3/uL (ref 0.1–1.0)
Monocytes Relative: 7 % (ref 3–12)
Neutro Abs: 4.2 10*3/uL (ref 1.7–7.7)
Neutrophils Relative %: 71 % (ref 43–77)
PLATELETS: 227 10*3/uL (ref 150–400)
RBC: 4.2 MIL/uL — AB (ref 4.22–5.81)
RDW: 13.4 % (ref 11.5–15.5)
WBC: 5.8 10*3/uL (ref 4.0–10.5)

## 2014-08-05 LAB — URINALYSIS, ROUTINE W REFLEX MICROSCOPIC
Bilirubin Urine: NEGATIVE
Glucose, UA: NEGATIVE mg/dL
Hgb urine dipstick: NEGATIVE
Ketones, ur: NEGATIVE mg/dL
NITRITE: NEGATIVE
Protein, ur: NEGATIVE mg/dL
SPECIFIC GRAVITY, URINE: 1.005 (ref 1.005–1.030)
UROBILINOGEN UA: 0.2 mg/dL (ref 0.0–1.0)
pH: 6 (ref 5.0–8.0)

## 2014-08-05 LAB — URINE MICROSCOPIC-ADD ON

## 2014-08-05 LAB — LIPASE, BLOOD: Lipase: 35 U/L (ref 11–59)

## 2014-08-05 MED ORDER — ONDANSETRON HCL 4 MG/2ML IJ SOLN
4.0000 mg | Freq: Once | INTRAMUSCULAR | Status: AC
  Administered 2014-08-05: 4 mg via INTRAVENOUS
  Filled 2014-08-05: qty 2

## 2014-08-05 MED ORDER — SODIUM CHLORIDE 0.9 % IV BOLUS (SEPSIS)
1000.0000 mL | Freq: Once | INTRAVENOUS | Status: AC
  Administered 2014-08-05: 1000 mL via INTRAVENOUS

## 2014-08-05 NOTE — ED Provider Notes (Signed)
CSN: [id number]     Arrival date & time 08/05/14  1613 History   First MD Initiated Contact with Patient 08/05/14 1625     Chief Complaint  Patient presents with  . Dehydration     (Consider location/radiation/quality/duration/timing/severity/associated sxs/prior Treatment) The history is provided by the patient.  [name] is a 63 y.o. male hx of COPD, HTN, polio, prostate cancer here with dehydration. He had dehydration a month ago was given IV fluids and was sent home. Since yesterday he noticed that his lips are very dry. He has been trying to drink fluids but still feels dehydrated. Has some nausea and one episode of vomiting. Denies any fevers. Denies any recent medication changes. States that he has been urinating. Saw urologist yesterday and patient was not in retention yesterday.    Past Medical History  Diagnosis Date  . COPD (chronic obstructive pulmonary disease)   . Hypertension   . Polio   . Bronchitis   . Angina pectoris   . Tuberculosis 1984    Hosp 4 months rx , left early  . Prostate cancer   . Chicken pox   . Rheumatic fever   . Measles   . Mumps   . GERD (gastroesophageal reflux disease)   . Alcoholism    Past Surgical History  Procedure Laterality Date  . Rotator cuff repair    . Other surgical history       Muscle & bone Graft/Polio  . Cholecystectomy    . Nose surgery    . Nasal septum surgery    . Kidney stone surgery    . Hand surgery    . Shoulder surgery      Arthritis & Bone Spurs  . Dental surgery     Family History  Problem Relation Age of Onset  . Alzheimer's disease Father 4    Deceased  . Stomach cancer Father   . Heart attack Father   . Heart disease Father   . Skin cancer Mother     Facial-Living  . Alcohol abuse Sister     x2  . Mental illness Sister     x2  . Diabetes Maternal Aunt     x2  . Thyroid disease Maternal Aunt     x4  . Diabetes Maternal Uncle   . Tuberculosis Paternal Grandfather   . Tuberculosis  Paternal Grandmother   . Alzheimer's disease Paternal Aunt   . Alzheimer's disease Paternal Uncle    History  Substance Use Topics  . Smoking status: Former Smoker -- 5.00 packs/day for 47 years    Types: Pipe, Cigarettes    Quit date: 08/15/2013  . Smokeless tobacco: Never Used     Comment: Would smoke a pack of pipe tobacco(1.5oz) every 10 days.   . Alcohol Use: Yes    Review of Systems  Gastrointestinal: Positive for nausea.  All other systems reviewed and are negative.     Allergies  Ivp dye; Penicillins; Aspirin; Morphine and related; and Nsaids  Home Medications   Prior to Admission medications   Medication Sig Start Date End Date Taking? Authorizing Provider  Aclidinium Bromide (TUDORZA PRESSAIR) 400 MCG/ACT AEPB One puff twice daily 07/08/14   Elsie Stain, MD  albuterol (PROVENTIL HFA;VENTOLIN HFA) 108 (90 BASE) MCG/ACT inhaler Inhale 1-2 puffs into the lungs every 6 (six) hours as needed for wheezing or shortness of breath. PROAIR 06/04/14   Brunetta Jeans, PA-C  albuterol (PROVENTIL) (2.5 MG/3ML) 0.083% nebulizer solution Take 2.5  mg by nebulization every 6 (six) hours as needed for wheezing or shortness of breath. Pt uses 2-3X daily    Historical Provider, MD  ALPRAZolam (XANAX) 1 MG tablet Take 1 tablet (1 mg total) by mouth 3 (three) times daily as needed for anxiety. 06/03/14   Brunetta Jeans, PA-C  amLODipine (NORVASC) 10 MG tablet TAKE 1 TABLET (10 MG TOTAL) BY MOUTH DAILY. 07/26/14   Brunetta Jeans, PA-C  EPINEPHrine (EPIPEN) 0.3 mg/0.3 mL IJ SOAJ injection Inject 0.3 mLs (0.3 mg total) into the muscle once. 04/05/14   Brunetta Jeans, PA-C  famotidine (PEPCID) 20 MG tablet Take 1 tablet (20 mg total) by mouth 2 (two) times daily. 03/10/14   Brunetta Jeans, PA-C  fluticasone (FLONASE) 50 MCG/ACT nasal spray Place 2 sprays into both nostrils daily. 07/08/14   Elsie Stain, MD  Fluticasone Furoate-Vilanterol (BREO ELLIPTA) 100-25 MCG/INH AEPB INHALE 1  PUFF DAILY AND RINSE MOUTH AFTER USE. 06/04/14   Brunetta Jeans, PA-C  loratadine (CLARITIN) 10 MG tablet TAKE 1 TABLET BY MOUTH EVERY DAY as needed 07/04/14   Brunetta Jeans, PA-C  losartan (COZAAR) 50 MG tablet TAKE 1 TABLET (50 MG TOTAL) BY MOUTH DAILY. 04/02/14   Brunetta Jeans, PA-C  montelukast (SINGULAIR) 10 MG tablet Take 10 mg by mouth at bedtime.    Historical Provider, MD  omeprazole (PRILOSEC) 20 MG capsule Take 1 capsule (20 mg total) by mouth daily. 07/08/14   Elsie Stain, MD  oxyCODONE-acetaminophen (PERCOCET) 10-325 MG per tablet Take 1 tablet by mouth every 8 (eight) hours as needed for pain. 07/02/14   Brunetta Jeans, PA-C  predniSONE (DELTASONE) 10 MG tablet Take 4 for three days 3 for three days 2 for three days 1 for three days and stop 07/08/14   Elsie Stain, MD  promethazine (PHENERGAN) 12.5 MG tablet Take 1 tablet (12.5 mg total) by mouth every 8 (eight) hours as needed for nausea or vomiting. 07/13/14   Brunetta Jeans, PA-C  sildenafil (VIAGRA) 100 MG tablet Take 0.5 tablets (50 mg total) by mouth daily as needed for erectile dysfunction. 07/13/14   Brunetta Jeans, PA-C  SPIRIVA HANDIHALER 18 MCG inhalation capsule USE 1 PUFF BY MOUTH EVERY DAY 08/05/14   Elsie Stain, MD  tamsulosin (FLOMAX) 0.4 MG CAPS capsule Take 0.4 mg by mouth 2 (two) times daily.  05/31/14   Historical Provider, MD   BP 167/86  Pulse 85  Temp(Src) 98.8 F (37.1 C) (Oral)  Resp 24  Ht 6\' 1"  (1.854 m)  Wt 219 lb (99.338 kg)  BMI 28.90 kg/m2  SpO2 99% Physical Exam  Nursing note and vitals reviewed. Constitutional: He is oriented to person, place, and time. He appears well-developed and well-nourished.  Anxious   HENT:  Head: Normocephalic.  MM slightly dry   Eyes: Conjunctivae are normal. Pupils are equal, round, and reactive to light.  Neck: Normal range of motion. Neck supple.  Cardiovascular: Normal rate and regular rhythm.   Pulmonary/Chest: Effort normal and breath  sounds normal. No respiratory distress. He has no wheezes. He has no rales.  Abdominal: Soft. Bowel sounds are normal. He exhibits no distension. There is no tenderness. There is no rebound and no guarding.  Musculoskeletal: Normal range of motion. He exhibits no edema and no tenderness.  Neurological: He is alert and oriented to person, place, and time. No cranial nerve deficit. Coordination normal.  Skin: Skin is warm and dry.  Psychiatric: He has a normal mood and affect. His behavior is normal. Judgment and thought content normal.    ED Course  Procedures (including critical care time) Labs Review Labs Reviewed  URINALYSIS, ROUTINE W REFLEX MICROSCOPIC - Abnormal; Notable for the following:    Leukocytes, UA TRACE (*)    All other components within normal limits  CBC WITH DIFFERENTIAL - Abnormal; Notable for the following:    RBC 4.20 (*)    All other components within normal limits  COMPREHENSIVE METABOLIC PANEL - Abnormal; Notable for the following:    Glucose, Bld 100 (*)    GFR calc non Af Amer 69 (*)    GFR calc Af Amer 80 (*)    Anion gap 16 (*)    All other components within normal limits  URINE MICROSCOPIC-ADD ON  LIPASE, BLOOD    Imaging Review No results found.   EKG Interpretation None      MDM   Final diagnoses:  None   [name] is a 63 y.o. male here with dry lips. Overall doesn't appear very dehydrated. I think he is more anxious about his symptoms. Will check labs and UA and give IVF and reassess.   6:32 PM Cr back to baseline. UA showed no ketones. I don't know why his lips are dry. He has been tolerating PO. Lips improved with IVF. Will d/c home     Wandra Arthurs, MD 08/05/14 [phone]

## 2014-08-05 NOTE — Discharge Instructions (Signed)
Stay hydrated.   Follow up with your urologist, PMD, and pulmonologist.   Return to ER if you have worse dehydration, vomiting, severe pain.

## 2014-08-05 NOTE — ED Notes (Signed)
States he is dehydrated and needs fluids. Hx of prostate cancer.

## 2014-08-06 DIAGNOSIS — R338 Other retention of urine: Secondary | ICD-10-CM | POA: Diagnosis not present

## 2014-08-06 DIAGNOSIS — R10817 Generalized abdominal tenderness: Secondary | ICD-10-CM | POA: Diagnosis not present

## 2014-08-06 NOTE — Telephone Encounter (Signed)
Rx request to pharmacy/SLS  

## 2014-08-09 ENCOUNTER — Other Ambulatory Visit: Payer: Self-pay | Admitting: Urology

## 2014-08-10 NOTE — Telephone Encounter (Signed)
Spoke with pt. He states that insurance asked him is he was happy with the medication and told him it would not be available to him next year. Pt called pharmacy and they did not know anything about medication not being available. Pt is unsure if insurance was trying to tell him it will not be on formulary next year? He is happy with  Medication at present and continues to get refill at pharmacy.

## 2014-08-11 NOTE — Telephone Encounter (Signed)
Can discuss at visit

## 2014-08-11 NOTE — Telephone Encounter (Signed)
FYI

## 2014-08-12 ENCOUNTER — Other Ambulatory Visit: Payer: Self-pay | Admitting: Physician Assistant

## 2014-08-13 ENCOUNTER — Ambulatory Visit (INDEPENDENT_AMBULATORY_CARE_PROVIDER_SITE_OTHER): Payer: Medicare HMO | Admitting: Physician Assistant

## 2014-08-13 ENCOUNTER — Ambulatory Visit (HOSPITAL_COMMUNITY): Payer: Medicare HMO

## 2014-08-13 ENCOUNTER — Encounter: Payer: Self-pay | Admitting: Physician Assistant

## 2014-08-13 VITALS — BP 102/82 | HR 88 | Temp 98.2°F | Resp 16 | Ht 73.0 in | Wt 215.0 lb

## 2014-08-13 DIAGNOSIS — E860 Dehydration: Secondary | ICD-10-CM

## 2014-08-13 DIAGNOSIS — Z23 Encounter for immunization: Secondary | ICD-10-CM

## 2014-08-13 DIAGNOSIS — N521 Erectile dysfunction due to diseases classified elsewhere: Secondary | ICD-10-CM

## 2014-08-13 DIAGNOSIS — R631 Polydipsia: Secondary | ICD-10-CM

## 2014-08-13 DIAGNOSIS — R3589 Other polyuria: Secondary | ICD-10-CM

## 2014-08-13 DIAGNOSIS — C61 Malignant neoplasm of prostate: Secondary | ICD-10-CM

## 2014-08-13 DIAGNOSIS — R358 Other polyuria: Secondary | ICD-10-CM

## 2014-08-13 NOTE — Progress Notes (Signed)
Pre visit review using our clinic review tool, if applicable. No additional management support is needed unless otherwise documented below in the visit note/SLS  

## 2014-08-13 NOTE — Progress Notes (Signed)
Patient presents to clinic today for 1 month follow-up of erectile dysfunction after starting the Viagra for ED.  Patient has upcoming prostatectomy due to Prostate Cancer and urinary retention secondary to enlarged prostate.  Patient has not started Viagra at present, as he states he is more concerned with having his procedure.   Patient also recently seen in the ER for dehydration and urinary retention.  Endorses significant urinary output with I/O catheter in the ED.  Was found to also be dehydrated and given (per patient) 2 bags of normal saline before being discharged.  Patient endorses good food and fluid intake.  Is currently on Tamsulosin for urinary flow.  Denies lightheadedness or dizziness.  Is unsure why he keeps becoming dehydrated.  Recent labs in clinic including glucose looked good.  Is due for repeat A1C.  Denies family history of DI.  Past Medical History  Diagnosis Date  . COPD (chronic obstructive pulmonary disease)   . Hypertension   . Polio   . Bronchitis   . Angina pectoris   . Tuberculosis 1984    Hosp 4 months rx , left early  . Prostate cancer   . Chicken pox   . Rheumatic fever   . Measles   . Mumps   . GERD (gastroesophageal reflux disease)   . Alcoholism     Current Outpatient Prescriptions on File Prior to Visit  Medication Sig Dispense Refill  . Aclidinium Bromide (TUDORZA PRESSAIR) 400 MCG/ACT AEPB One puff twice daily 1 each 6  . albuterol (PROVENTIL HFA;VENTOLIN HFA) 108 (90 BASE) MCG/ACT inhaler Inhale 1-2 puffs into the lungs every 6 (six) hours as needed for wheezing or shortness of breath. PROAIR 3 Inhaler 1  . albuterol (PROVENTIL) (2.5 MG/3ML) 0.083% nebulizer solution Take 2.5 mg by nebulization every 6 (six) hours as needed for wheezing or shortness of breath. Pt uses 2-3X daily    . ALPRAZolam (XANAX) 1 MG tablet Take 1 tablet (1 mg total) by mouth 3 (three) times daily as needed for anxiety. 90 tablet 0  . amLODipine (NORVASC) 10 MG tablet  TAKE 1 TABLET (10 MG TOTAL) BY MOUTH DAILY. 90 tablet 0  . EPINEPHrine (EPIPEN) 0.3 mg/0.3 mL IJ SOAJ injection Inject 0.3 mLs (0.3 mg total) into the muscle once. 1 Device 2  . famotidine (PEPCID) 20 MG tablet TAKE 1 TABLET (20 MG TOTAL) BY MOUTH 2 (TWO) TIMES DAILY. 60 tablet 1  . fluticasone (FLONASE) 50 MCG/ACT nasal spray Place 2 sprays into both nostrils daily. 16 g 12  . Fluticasone Furoate-Vilanterol (BREO ELLIPTA) 100-25 MCG/INH AEPB INHALE 1 PUFF DAILY AND RINSE MOUTH AFTER USE. 180 each 1  . loratadine (CLARITIN) 10 MG tablet TAKE 1 TABLET BY MOUTH EVERY DAY as needed    . losartan (COZAAR) 50 MG tablet TAKE 1 TABLET (50 MG TOTAL) BY MOUTH DAILY. 90 tablet 0  . montelukast (SINGULAIR) 10 MG tablet Take 10 mg by mouth at bedtime.    Marland Kitchen omeprazole (PRILOSEC) 20 MG capsule Take 1 capsule (20 mg total) by mouth daily. 30 capsule 4  . oxyCODONE-acetaminophen (PERCOCET) 10-325 MG per tablet Take 1 tablet by mouth every 8 (eight) hours as needed for pain. 120 tablet 0  . promethazine (PHENERGAN) 12.5 MG tablet Take 1 tablet (12.5 mg total) by mouth every 8 (eight) hours as needed for nausea or vomiting. 30 tablet 0  . sildenafil (VIAGRA) 100 MG tablet Take 0.5 tablets (50 mg total) by mouth daily as needed for erectile dysfunction.  6 tablet 2  . SPIRIVA HANDIHALER 18 MCG inhalation capsule USE 1 PUFF BY MOUTH EVERY DAY 30 capsule 6  . tamsulosin (FLOMAX) 0.4 MG CAPS capsule Take 0.4 mg by mouth 2 (two) times daily.      No current facility-administered medications on file prior to visit.    Allergies  Allergen Reactions  . Ivp Dye [Iodinated Diagnostic Agents] Anaphylaxis  . Penicillins Anaphylaxis    Heart stops  . Aspirin Other (See Comments)    Reaction unknown  . Morphine And Related Nausea And Vomiting  . Nsaids Other (See Comments)    Difficulty breathing    Family History  Problem Relation Age of Onset  . Alzheimer's disease Father 4    Deceased  . Stomach cancer Father     . Heart attack Father   . Heart disease Father   . Skin cancer Mother     Facial-Living  . Alcohol abuse Sister     x2  . Mental illness Sister     x2  . Diabetes Maternal Aunt     x2  . Thyroid disease Maternal Aunt     x4  . Diabetes Maternal Uncle   . Tuberculosis Paternal Grandfather   . Tuberculosis Paternal Grandmother   . Alzheimer's disease Paternal Aunt   . Alzheimer's disease Paternal Uncle     History   Social History  . Marital Status: Married    Spouse Name: N/A    Number of Children: N/A  . Years of Education: N/A   Social History Main Topics  . Smoking status: Former Smoker -- 5.00 packs/day for 47 years    Types: Pipe, Cigarettes    Quit date: 08/15/2013  . Smokeless tobacco: Never Used     Comment: Would smoke a pack of pipe tobacco(1.5oz) every 10 days.   . Alcohol Use: Yes  . Drug Use: No  . Sexual Activity: None   Other Topics Concern  . None   Social History Narrative    Review of Systems - See HPI.  All other ROS are negative.  BP 102/82 mmHg  Pulse 88  Temp(Src) 98.2 F (36.8 C) (Oral)  Resp 16  Ht 6' 1" (1.854 m)  Wt 215 lb (97.523 kg)  BMI 28.37 kg/m2  SpO2 97%  Physical Exam  Constitutional: He is oriented to person, place, and time and well-developed, well-nourished, and in no distress.  HENT:  Head: Normocephalic and atraumatic.  Eyes: Conjunctivae are normal. Pupils are equal, round, and reactive to light.  Neck: Neck supple.  Cardiovascular: Normal rate, regular rhythm, normal heart sounds and intact distal pulses.   Pulmonary/Chest: Effort normal and breath sounds normal. No respiratory distress. He has no wheezes. He has no rales. He exhibits no tenderness.  Neurological: He is alert and oriented to person, place, and time.  Skin: Skin is warm and dry. No rash noted.  Psychiatric: Affect normal.  Vitals reviewed.   Recent Results (from the past 2160 hour(s))  Urinalysis, Routine w reflex microscopic     Status:  None   Collection Time: 05/25/14 10:27 PM  Result Value Ref Range   Color, Urine YELLOW YELLOW   APPearance CLEAR CLEAR   Specific Gravity, Urine 1.007 1.005 - 1.030   pH 5.5 5.0 - 8.0   Glucose, UA NEGATIVE NEGATIVE mg/dL   Hgb urine dipstick NEGATIVE NEGATIVE   Bilirubin Urine NEGATIVE NEGATIVE   Ketones, ur NEGATIVE NEGATIVE mg/dL   Protein, ur NEGATIVE NEGATIVE mg/dL  Urobilinogen, UA 0.2 0.0 - 1.0 mg/dL   Nitrite NEGATIVE NEGATIVE   Leukocytes, UA NEGATIVE NEGATIVE    Comment: MICROSCOPIC NOT DONE ON URINES WITH NEGATIVE PROTEIN, BLOOD, LEUKOCYTES, NITRITE, OR GLUCOSE <1000 mg/dL.  CBC with Differential     Status: Abnormal   Collection Time: 05/26/14 12:00 PM  Result Value Ref Range   WBC 6.7 4.0 - 10.5 K/uL   RBC 4.74 4.22 - 5.81 MIL/uL   Hemoglobin 15.3 13.0 - 17.0 g/dL   HCT 44.8 39.0 - 52.0 %   MCV 94.5 78.0 - 100.0 fL   MCH 32.3 26.0 - 34.0 pg   MCHC 34.2 30.0 - 36.0 g/dL   RDW 12.7 11.5 - 15.5 %   Platelets 274 150 - 400 K/uL   Neutrophils Relative % 53 43 - 77 %   Neutro Abs 3.5 1.7 - 7.7 K/uL   Lymphocytes Relative 29 12 - 46 %   Lymphs Abs 2.0 0.7 - 4.0 K/uL   Monocytes Relative 10 3 - 12 %   Monocytes Absolute 0.7 0.1 - 1.0 K/uL   Eosinophils Relative 7 (H) 0 - 5 %   Eosinophils Absolute 0.5 0.0 - 0.7 K/uL   Basophils Relative 1 0 - 1 %   Basophils Absolute 0.1 0.0 - 0.1 K/uL  Comprehensive metabolic panel     Status: Abnormal   Collection Time: 05/26/14 12:00 PM  Result Value Ref Range   Sodium 139 137 - 147 mEq/L   Potassium 4.3 3.7 - 5.3 mEq/L   Chloride 101 96 - 112 mEq/L   CO2 24 19 - 32 mEq/L   Glucose, Bld 108 (H) 70 - 99 mg/dL   BUN 15 6 - 23 mg/dL   Creatinine, Ser 1.20 0.50 - 1.35 mg/dL   Calcium 10.1 8.4 - 10.5 mg/dL   Total Protein 7.9 6.0 - 8.3 g/dL   Albumin 4.4 3.5 - 5.2 g/dL   AST 31 0 - 37 U/L   ALT 35 0 - 53 U/L   Alkaline Phosphatase 68 39 - 117 U/L   Total Bilirubin 0.6 0.3 - 1.2 mg/dL   GFR calc non Af Amer 62 (L) >90 mL/min    GFR calc Af Amer 72 (L) >90 mL/min    Comment: (NOTE) The eGFR has been calculated using the CKD EPI equation. This calculation has not been validated in all clinical situations. eGFR's persistently <90 mL/min signify possible Chronic Kidney Disease.   Anion gap 14 5 - 15  Troponin I     Status: None   Collection Time: 05/26/14 12:00 PM  Result Value Ref Range   Troponin I <0.30 <0.30 ng/mL    Comment:        Due to the release kinetics of cTnI, a negative result within the first hours of the onset of symptoms does not rule out myocardial infarction with certainty. If myocardial infarction is still suspected, repeat the test at appropriate intervals.  Urine culture     Status: None   Collection Time: 05/26/14  1:30 PM  Result Value Ref Range   Specimen Description URINE, CATHETERIZED    Special Requests NONE    Culture  Setup Time      05/27/2014 14:16 Performed at [facility] Performed at [place] Performed at [facility]    Report Status 05/28/2014 FINAL   Urinalysis, Routine w reflex microscopic     Status: Abnormal  Collection Time: 06/29/14  1:18 PM  Result Value Ref Range   Color, Urine YELLOW Yellow;Lt. Yellow   APPearance CLEAR Clear   Specific Gravity, Urine 1.015 1.000-1.030   pH 5.5 5.0 - 8.0   Total Protein, Urine NEGATIVE Negative   Urine Glucose NEGATIVE Negative   Ketones, ur NEGATIVE Negative   Bilirubin Urine NEGATIVE Negative   Hgb urine dipstick NEGATIVE Negative   Urobilinogen, UA 0.2 0.0 - 1.0   Leukocytes, UA NEGATIVE Negative   Nitrite NEGATIVE Negative   WBC, UA 3-6/hpf (A) 0-2/hpf   RBC / HPF none seen 0-2/hpf   Squamous Epithelial / LPF Rare(0-4/hpf) Rare(0-4/hpf)  Urine culture     Status: None   Collection Time: 06/29/14  1:45 PM  Result Value Ref Range   Colony Count NO GROWTH    Organism ID, Bacteria NO GROWTH   CBC WITH DIFFERENTIAL     Status: Abnormal     Collection Time: 07/01/14  9:25 PM  Result Value Ref Range   WBC 7.3 4.0 - 10.5 K/uL   RBC 4.27 4.22 - 5.81 MIL/uL   Hemoglobin 13.7 13.0 - 17.0 g/dL   HCT 39.5 39.0 - 52.0 %   MCV 92.5 78.0 - 100.0 fL   MCH 32.1 26.0 - 34.0 pg   MCHC 34.7 30.0 - 36.0 g/dL   RDW 13.0 11.5 - 15.5 %   Platelets 213 150 - 400 K/uL   Neutrophils Relative % 63 43 - 77 %   Neutro Abs 4.6 1.7 - 7.7 K/uL   Lymphocytes Relative 22 12 - 46 %   Lymphs Abs 1.6 0.7 - 4.0 K/uL   Monocytes Relative 8 3 - 12 %   Monocytes Absolute 0.6 0.1 - 1.0 K/uL   Eosinophils Relative 7 (H) 0 - 5 %   Eosinophils Absolute 0.5 0.0 - 0.7 K/uL   Basophils Relative 0 0 - 1 %   Basophils Absolute 0.0 0.0 - 0.1 K/uL  Comprehensive metabolic panel     Status: Abnormal   Collection Time: 07/01/14  9:25 PM  Result Value Ref Range   Sodium 137 137 - 147 mEq/L   Potassium 4.5 3.7 - 5.3 mEq/L   Chloride 97 96 - 112 mEq/L   CO2 27 19 - 32 mEq/L   Glucose, Bld 122 (H) 70 - 99 mg/dL   BUN 15 6 - 23 mg/dL   Creatinine, Ser 1.40 (H) 0.50 - 1.35 mg/dL   Calcium 9.4 8.4 - 10.5 mg/dL   Total Protein 7.4 6.0 - 8.3 g/dL   Albumin 3.9 3.5 - 5.2 g/dL   AST 20 0 - 37 U/L   ALT 25 0 - 53 U/L   Alkaline Phosphatase 63 39 - 117 U/L   Total Bilirubin 0.6 0.3 - 1.2 mg/dL   GFR calc non Af Amer 52 (L) >90 mL/min   GFR calc Af Amer 60 (L) >90 mL/min    Comment: (NOTE) The eGFR has been calculated using the CKD EPI equation. This calculation has not been validated in all clinical situations. eGFR's persistently <90 mL/min signify possible Chronic Kidney Disease.   Anion gap 13 5 - 15  Urinalysis with microscopic     Status: None   Collection Time: 07/01/14  9:37 PM  Result Value Ref Range   Color, Urine YELLOW YELLOW   APPearance CLEAR CLEAR   Specific Gravity, Urine 1.006 1.005 - 1.030   pH 7.0 5.0 - 8.0   Glucose, UA NEGATIVE NEGATIVE mg/dL  Hgb urine dipstick NEGATIVE NEGATIVE   Bilirubin Urine NEGATIVE NEGATIVE   Ketones, ur  NEGATIVE NEGATIVE mg/dL   Protein, ur NEGATIVE NEGATIVE mg/dL   Urobilinogen, UA 0.2 0.0 - 1.0 mg/dL   Nitrite NEGATIVE NEGATIVE   Leukocytes, UA NEGATIVE NEGATIVE    Comment: MICROSCOPIC NOT DONE ON URINES WITH NEGATIVE PROTEIN, BLOOD, LEUKOCYTES, NITRITE, OR GLUCOSE <1000 mg/dL.  Urinalysis, Routine w reflex microscopic     Status: Abnormal   Collection Time: 08/05/14  4:27 PM  Result Value Ref Range   Color, Urine YELLOW YELLOW   APPearance CLEAR CLEAR   Specific Gravity, Urine 1.005 1.005 - 1.030   pH 6.0 5.0 - 8.0   Glucose, UA NEGATIVE NEGATIVE mg/dL   Hgb urine dipstick NEGATIVE NEGATIVE   Bilirubin Urine NEGATIVE NEGATIVE   Ketones, ur NEGATIVE NEGATIVE mg/dL   Protein, ur NEGATIVE NEGATIVE mg/dL   Urobilinogen, UA 0.2 0.0 - 1.0 mg/dL   Nitrite NEGATIVE NEGATIVE   Leukocytes, UA TRACE (A) NEGATIVE  Urine microscopic-add on     Status: None   Collection Time: 08/05/14  4:27 PM  Result Value Ref Range   Squamous Epithelial / LPF RARE RARE   WBC, UA 0-2 <3 WBC/hpf   Bacteria, UA RARE RARE  CBC with Differential     Status: Abnormal   Collection Time: 08/05/14  5:34 PM  Result Value Ref Range   WBC 5.8 4.0 - 10.5 K/uL   RBC 4.20 (L) 4.22 - 5.81 MIL/uL   Hemoglobin 13.1 13.0 - 17.0 g/dL   HCT 39.2 39.0 - 52.0 %   MCV 93.3 78.0 - 100.0 fL   MCH 31.2 26.0 - 34.0 pg   MCHC 33.4 30.0 - 36.0 g/dL   RDW 13.4 11.5 - 15.5 %   Platelets 227 150 - 400 K/uL   Neutrophils Relative % 71 43 - 77 %   Neutro Abs 4.2 1.7 - 7.7 K/uL   Lymphocytes Relative 19 12 - 46 %   Lymphs Abs 1.1 0.7 - 4.0 K/uL   Monocytes Relative 7 3 - 12 %   Monocytes Absolute 0.4 0.1 - 1.0 K/uL   Eosinophils Relative 3 0 - 5 %   Eosinophils Absolute 0.2 0.0 - 0.7 K/uL   Basophils Relative 0 0 - 1 %   Basophils Absolute 0.0 0.0 - 0.1 K/uL  Comprehensive metabolic panel     Status: Abnormal   Collection Time: 08/05/14  5:34 PM  Result Value Ref Range   Sodium 137 137 - 147 mEq/L   Potassium 4.0 3.7 - 5.3  mEq/L   Chloride 96 96 - 112 mEq/L   CO2 25 19 - 32 mEq/L   Glucose, Bld 100 (H) 70 - 99 mg/dL   BUN 16 6 - 23 mg/dL   Creatinine, Ser 1.10 0.50 - 1.35 mg/dL   Calcium 9.7 8.4 - 10.5 mg/dL   Total Protein 7.5 6.0 - 8.3 g/dL   Albumin 4.1 3.5 - 5.2 g/dL   AST 22 0 - 37 U/L   ALT 24 0 - 53 U/L   Alkaline Phosphatase 68 39 - 117 U/L   Total Bilirubin 0.4 0.3 - 1.2 mg/dL   GFR calc non Af Amer 69 (L) >90 mL/min   GFR calc Af Amer 80 (L) >90 mL/min    Comment: (NOTE) The eGFR has been calculated using the CKD EPI equation. This calculation has not been validated in all clinical situations. eGFR's persistently <90 mL/min signify possible Chronic Kidney Disease.  Anion gap 16 (H) 5 - 15  Lipase, blood     Status: None   Collection Time: 08/05/14  5:39 PM  Result Value Ref Range   Lipase 35 11 - 59 U/L  Basic Metabolic Panel (BMET)     Status: None   Collection Time: 08/13/14  3:56 PM  Result Value Ref Range   Sodium 135 135 - 145 mEq/L   Potassium 4.7 3.5 - 5.3 mEq/L   Chloride 104 96 - 112 mEq/L   CO2 21 19 - 32 mEq/L   Glucose, Bld 74 70 - 99 mg/dL   BUN 13 6 - 23 mg/dL   Creat 1.18 0.50 - 1.35 mg/dL   Calcium 9.4 8.4 - 10.5 mg/dL  Hemoglobin A1c     Status: Abnormal   Collection Time: 08/13/14  3:56 PM  Result Value Ref Range   Hemoglobin A1C 6.0 (H) <5.7 %    Comment:                                                                        According to the ADA Clinical Practice Recommendations for 2011, when HbA1c is used as a screening test:     >=6.5%   Diagnostic of Diabetes Mellitus            (if abnormal result is confirmed)   5.7-6.4%   Increased risk of developing Diabetes Mellitus   References:Diagnosis and Classification of Diabetes Mellitus,Diabetes MGQQ,7619,50(DTOIZ 1):S62-S69 and Standards of Medical Care in         Diabetes - 2011,Diabetes Care,2011,34 (Suppl 1):S11-S61.     Mean Plasma Glucose 126 (H) <117 mg/dL  Osmolality     Status: None    Collection Time: 08/13/14  3:56 PM  Result Value Ref Range   Osmolality 289 275 - 300 mOsm/kg  Osmolality, urine     Status: Abnormal   Collection Time: 08/13/14  3:56 PM  Result Value Ref Range   Osmolality, Ur 211 (L) 390 - 1090 mOsm/kg  Urinalysis, Routine w reflex microscopic     Status: None   Collection Time: 08/13/14  3:56 PM  Result Value Ref Range   Color, Urine YELLOW YELLOW   APPearance CLEAR CLEAR   Specific Gravity, Urine 1.009 1.005 - 1.030   pH 5.0 5.0 - 8.0   Glucose, UA NEG NEG mg/dL   Bilirubin Urine NEG NEG   Ketones, ur NEG NEG mg/dL   Hgb urine dipstick NEG NEG   Protein, ur NEG NEG mg/dL   Urobilinogen, UA 0.2 0.0 - 1.0 mg/dL   Nitrite NEG NEG   Leukocytes, UA NEG NEG    Assessment/Plan: Dehydration Unclear etiology for recurrence of symptoms. Most recent episode 2 weeks ago.  Patient with previously pre-diabetic range glucose.  Will recheck BMP and A1C today.  Will also obtain Urine osmolality, serum osmolality to begin workup for DI.  Follow-up will be based on lab results.  Continue hydration and well-balanced diet.  Avoid overexertion.  Erectile dysfunction Patient has not started Viagra.  Will begin after his procedure. No change to regimen at present.  Prostate cancer Has upcoming laparoscopic prostatectomy by Dr. Jeffie Pollock next week.  Follow-up with Speclialist as scheduled.  Follow-up here in 1 month.

## 2014-08-13 NOTE — Patient Instructions (Signed)
Please Read Information below on Diabetes Insipidus.  Giving your history this is the condition I want to rule out today.  We are also checking to make sure you have not developed diabetes since last check.  Continue other medications as directed.  I will call you with all results.  Good luck on your procedure this week.  Diabetes Insipidus Diabetes insipidus is a rare condition that causes the body to create more urine than normal, which leads to thirst and dehydration. The urine is made mostly of water (dilute urine). There are several types of diabetes insipidus. The most common forms are related to decreased production or resistance to the hormone that regulates urine production (antidiuretic hormone). Diabetes insipidus can be managed and is not usually serious. Diabetes insipidus is not the same as diabetes mellitus, which is the more common condition that causes high blood sugar. CAUSES   Brain tumor.  Brain surgery.  Brain trauma.  Genetics.  Cancer.  Hypoxic encephalopathy.  Genetic immune system disease.  Anorexia nervosa.  Vascular abnormalities and conditions.  Lithium toxicity.  High concentrations of calcium.  Low potassium.  Kidney disease.  Pregnancy.  Low blood sugar.  Certain medicines. SIGNS AND SYMPTOMS  Excessive urination. Excessive urination means urinating 10.4-12.5 cups (2.5-3 L) in 24 hours.  Excessive thirst.  Frequent nighttime urination.  Vomiting.  Diarrhea. DIAGNOSIS  Diabetes insipidus may be diagnosed with:  A physical exam.  Blood tests.  Urine tests. TREATMENT  Once the specific type of diabetes insipidus is identified, treatment may include one or more of the following:  Increasing your water intake.  Medicines.  Changing your diet. You may be put on a low-protein or low-sodium diet. You may need to see your health care provider regularly. HOME CARE INSTRUCTIONS  Drink water and fluids as directed by your health  care provider.  Follow your health care provider's diet instructions if they were given.  Take medicines as directed by your health care provider.  Monitor your risk of dehydration in extreme heat if directed by your health care provider.  Keep all follow-up appointments with your health care provider.  Make an appointment with your health care provider to find out the results of your tests if the results were not back during your visit. Do not assume everything is normal if you have not heard from your health care provider or the medical facility. SEEK MEDICAL CARE IF: Your symptoms continue even after treatment. Document Released: 10/06/2013 Document Reviewed: 10/06/2013 [facility] Patient Information 2015 [place], [place]. This information is not intended to replace advice given to you by your health care provider. Make sure you discuss any questions you have with your health care provider.

## 2014-08-14 LAB — HEMOGLOBIN A1C
Hgb A1c MFr Bld: 6 % — ABNORMAL HIGH (ref <5.7)
Mean Plasma Glucose: 126 mg/dL — ABNORMAL HIGH (ref <117)

## 2014-08-14 LAB — URINALYSIS, ROUTINE W REFLEX MICROSCOPIC
Bilirubin Urine: NEGATIVE
Glucose, UA: NEGATIVE mg/dL
Hgb urine dipstick: NEGATIVE
Ketones, ur: NEGATIVE mg/dL
Leukocytes, UA: NEGATIVE
NITRITE: NEGATIVE
PROTEIN: NEGATIVE mg/dL
SPECIFIC GRAVITY, URINE: 1.009 (ref 1.005–1.030)
UROBILINOGEN UA: 0.2 mg/dL (ref 0.0–1.0)
pH: 5 (ref 5.0–8.0)

## 2014-08-14 LAB — BASIC METABOLIC PANEL
BUN: 13 mg/dL (ref 6–23)
CHLORIDE: 104 meq/L (ref 96–112)
CO2: 21 meq/L (ref 19–32)
Calcium: 9.4 mg/dL (ref 8.4–10.5)
Creat: 1.18 mg/dL (ref 0.50–1.35)
Glucose, Bld: 74 mg/dL (ref 70–99)
POTASSIUM: 4.7 meq/L (ref 3.5–5.3)
Sodium: 135 mEq/L (ref 135–145)

## 2014-08-14 LAB — OSMOLALITY: OSMOLALITY: 289 mosm/kg (ref 275–300)

## 2014-08-14 LAB — OSMOLALITY, URINE: OSMOLALITY UR: 211 mosm/kg — AB (ref 390–1090)

## 2014-08-15 DIAGNOSIS — R3589 Other polyuria: Secondary | ICD-10-CM | POA: Insufficient documentation

## 2014-08-15 DIAGNOSIS — R358 Other polyuria: Secondary | ICD-10-CM | POA: Insufficient documentation

## 2014-08-15 NOTE — Assessment & Plan Note (Signed)
Patient has not started Viagra.  Will begin after his procedure. No change to regimen at present.

## 2014-08-15 NOTE — Assessment & Plan Note (Signed)
Unclear etiology for recurrence of symptoms. Most recent episode 2 weeks ago.  Patient with previously pre-diabetic range glucose.  Will recheck BMP and A1C today.  Will also obtain Urine osmolality, serum osmolality to begin workup for DI.  Follow-up will be based on lab results.  Continue hydration and well-balanced diet.  Avoid overexertion.

## 2014-08-15 NOTE — Assessment & Plan Note (Signed)
Has upcoming laparoscopic prostatectomy by Dr. Jeffie Pollock next week.  Follow-up with Speclialist as scheduled.  Follow-up here in 1 month.

## 2014-08-16 ENCOUNTER — Telehealth (HOSPITAL_COMMUNITY): Payer: Self-pay

## 2014-08-16 NOTE — Telephone Encounter (Signed)
I have called and left a message with Misha to inquire about participation in Pulmonary Rehab. Will send letter in mail and follow up.

## 2014-08-17 ENCOUNTER — Encounter (HOSPITAL_BASED_OUTPATIENT_CLINIC_OR_DEPARTMENT_OTHER): Payer: Self-pay | Admitting: *Deleted

## 2014-08-17 NOTE — Progress Notes (Addendum)
NPO AFTER MN. ARRIVE AT 1015. CURRENT LAB RESULTS AND EKG IN CHART AND EPIC. WILL TAKE COZAAR, NORVASC, PRILOSEC, XANAX, AND AM INHALERS AM DOS W/ SIPS OF WATER. WILL DO FLEET ENEMA AM DOS.

## 2014-08-18 ENCOUNTER — Inpatient Hospital Stay (HOSPITAL_BASED_OUTPATIENT_CLINIC_OR_DEPARTMENT_OTHER)
Admission: EM | Admit: 2014-08-18 | Discharge: 2014-08-20 | DRG: 192 | Disposition: A | Discharge status: Home / Self Care | Payer: Medicare HMO | Attending: Internal Medicine | Admitting: Internal Medicine

## 2014-08-18 ENCOUNTER — Encounter (HOSPITAL_BASED_OUTPATIENT_CLINIC_OR_DEPARTMENT_OTHER): Payer: Self-pay

## 2014-08-18 ENCOUNTER — Emergency Department (HOSPITAL_BASED_OUTPATIENT_CLINIC_OR_DEPARTMENT_OTHER): Payer: Medicare HMO

## 2014-08-18 DIAGNOSIS — Z79899 Other long term (current) drug therapy: Secondary | ICD-10-CM

## 2014-08-18 DIAGNOSIS — Z87891 Personal history of nicotine dependence: Secondary | ICD-10-CM

## 2014-08-18 DIAGNOSIS — Z8546 Personal history of malignant neoplasm of prostate: Secondary | ICD-10-CM

## 2014-08-18 DIAGNOSIS — K219 Gastro-esophageal reflux disease without esophagitis: Secondary | ICD-10-CM | POA: Diagnosis present

## 2014-08-18 DIAGNOSIS — J441 Chronic obstructive pulmonary disease with (acute) exacerbation: Principal | ICD-10-CM

## 2014-08-18 DIAGNOSIS — N529 Male erectile dysfunction, unspecified: Secondary | ICD-10-CM | POA: Diagnosis present

## 2014-08-18 DIAGNOSIS — Z9049 Acquired absence of other specified parts of digestive tract: Secondary | ICD-10-CM | POA: Diagnosis present

## 2014-08-18 DIAGNOSIS — Z8611 Personal history of tuberculosis: Secondary | ICD-10-CM

## 2014-08-18 DIAGNOSIS — R079 Chest pain, unspecified: Secondary | ICD-10-CM | POA: Diagnosis not present

## 2014-08-18 DIAGNOSIS — G14 Postpolio syndrome: Secondary | ICD-10-CM | POA: Diagnosis present

## 2014-08-18 DIAGNOSIS — J440 Chronic obstructive pulmonary disease with acute lower respiratory infection: Secondary | ICD-10-CM | POA: Diagnosis present

## 2014-08-18 DIAGNOSIS — J209 Acute bronchitis, unspecified: Secondary | ICD-10-CM | POA: Diagnosis present

## 2014-08-18 DIAGNOSIS — F319 Bipolar disorder, unspecified: Secondary | ICD-10-CM | POA: Diagnosis present

## 2014-08-18 DIAGNOSIS — Z7951 Long term (current) use of inhaled steroids: Secondary | ICD-10-CM

## 2014-08-18 DIAGNOSIS — Z91041 Radiographic dye allergy status: Secondary | ICD-10-CM

## 2014-08-18 DIAGNOSIS — Z8612 Personal history of poliomyelitis: Secondary | ICD-10-CM

## 2014-08-18 DIAGNOSIS — I10 Essential (primary) hypertension: Secondary | ICD-10-CM | POA: Diagnosis present

## 2014-08-18 DIAGNOSIS — Z886 Allergy status to analgesic agent status: Secondary | ICD-10-CM

## 2014-08-18 DIAGNOSIS — Z880 Allergy status to penicillin: Secondary | ICD-10-CM

## 2014-08-18 DIAGNOSIS — F419 Anxiety disorder, unspecified: Secondary | ICD-10-CM | POA: Diagnosis present

## 2014-08-18 HISTORY — DX: Other complications of anesthesia, initial encounter: T88.59XA

## 2014-08-18 HISTORY — DX: Adverse effect of unspecified anesthetic, initial encounter: T41.45XA

## 2014-08-18 LAB — TROPONIN I
Troponin I: <0.3 ng/mL (ref <0.30)
Troponin I: <0.3 ng/mL (ref <0.30)

## 2014-08-18 LAB — COMPREHENSIVE METABOLIC PANEL
ALBUMIN: 3.9 g/dL (ref 3.5–5.2)
ALT: 26 U/L (ref 0–53)
AST: 21 U/L (ref 0–37)
Alkaline Phosphatase: 76 U/L (ref 39–117)
Anion gap: 14 (ref 5–15)
BUN: 15 mg/dL (ref 6–23)
CHLORIDE: 101 meq/L (ref 96–112)
CO2: 22 meq/L (ref 19–32)
Calcium: 9.5 mg/dL (ref 8.4–10.5)
Creatinine, Ser: 1.1 mg/dL (ref 0.50–1.35)
GFR calc Af Amer: 80 mL/min — ABNORMAL LOW (ref >90)
GFR, EST NON AFRICAN AMERICAN: 69 mL/min — AB (ref >90)
Glucose, Bld: 102 mg/dL — ABNORMAL HIGH (ref 70–99)
Potassium: 4.2 mEq/L (ref 3.7–5.3)
SODIUM: 137 meq/L (ref 137–147)
Total Bilirubin: 0.3 mg/dL (ref 0.3–1.2)
Total Protein: 7.4 g/dL (ref 6.0–8.3)

## 2014-08-18 LAB — CREATININE, SERUM
Creatinine, Ser: 1.04 mg/dL (ref 0.50–1.35)
GFR calc Af Amer: 86 mL/min — ABNORMAL LOW (ref >90)
GFR calc non Af Amer: 74 mL/min — ABNORMAL LOW (ref >90)

## 2014-08-18 LAB — D-DIMER, QUANTITATIVE: D-Dimer, Quant: 0.36 ug/mL-FEU (ref 0.00–0.48)

## 2014-08-18 LAB — CBC
HCT: 38.3 % — ABNORMAL LOW (ref 39.0–52.0)
HEMATOCRIT: 37.1 % — AB (ref 39.0–52.0)
Hemoglobin: 13.4 g/dL (ref 13.0–17.0)
Hemoglobin: 12.7 g/dL — ABNORMAL LOW (ref 13.0–17.0)
MCH: 31.8 pg (ref 26.0–34.0)
MCH: 30.8 pg (ref 26.0–34.0)
MCHC: 35 g/dL (ref 30.0–36.0)
MCHC: 34.2 g/dL (ref 30.0–36.0)
MCV: 91 fL (ref 78.0–100.0)
MCV: 90 fL (ref 78.0–100.0)
PLATELETS: 259 10*3/uL (ref 150–400)
Platelets: 267 10*3/uL (ref 150–400)
RBC: 4.21 MIL/uL — AB (ref 4.22–5.81)
RBC: 4.12 MIL/uL — ABNORMAL LOW (ref 4.22–5.81)
RDW: 13.2 % (ref 11.5–15.5)
RDW: 13.5 % (ref 11.5–15.5)
WBC: 6.7 10*3/uL (ref 4.0–10.5)
WBC: 5.9 10*3/uL (ref 4.0–10.5)

## 2014-08-18 MED ORDER — OXYCODONE-ACETAMINOPHEN 10-325 MG PO TABS
1.0000 | ORAL_TABLET | Freq: Three times a day (TID) | ORAL | Status: DC | PRN
Start: 2014-08-18 — End: 2014-08-18
  Filled 2014-08-18: qty 1

## 2014-08-18 MED ORDER — OXYCODONE HCL 5 MG PO TABS: 5.0000 mg | ORAL_TABLET | Freq: Three times a day (TID) | ORAL | Status: DC | PRN

## 2014-08-18 MED ORDER — BUDESONIDE-FORMOTEROL FUMARATE 160-4.5 MCG/ACT IN AERO
2.0000 | INHALATION_SPRAY | Freq: Two times a day (BID) | RESPIRATORY_TRACT | Status: DC
  Administered 2014-08-19 – 2014-08-20 (×3): 2 via RESPIRATORY_TRACT
  Filled 2014-08-18: qty 6

## 2014-08-18 MED ORDER — IPRATROPIUM-ALBUTEROL 0.5-2.5 (3) MG/3ML IN SOLN
3.0000 mL | Freq: Three times a day (TID) | RESPIRATORY_TRACT | Status: DC
  Administered 2014-08-19 – 2014-08-20 (×4): 3 mL via RESPIRATORY_TRACT
  Filled 2014-08-18 (×4): qty 3

## 2014-08-18 MED ORDER — METHYLPREDNISOLONE SODIUM SUCC 125 MG IJ SOLR
60.0000 mg | Freq: Four times a day (QID) | INTRAMUSCULAR | Status: DC
  Administered 2014-08-18 – 2014-08-19 (×3): 60 mg via INTRAVENOUS
  Filled 2014-08-18 (×3): qty 2

## 2014-08-18 MED ORDER — FLUTICASONE PROPIONATE 50 MCG/ACT NA SUSP
2.0000 | Freq: Every day | NASAL | Status: DC
  Administered 2014-08-18 – 2014-08-20 (×3): 2 via NASAL
  Filled 2014-08-18: qty 16

## 2014-08-18 MED ORDER — LEVALBUTEROL HCL 0.63 MG/3ML IN NEBU: 0.6300 mg | INHALATION_SOLUTION | Freq: Four times a day (QID) | RESPIRATORY_TRACT | Status: DC | PRN

## 2014-08-18 MED ORDER — ONDANSETRON HCL 4 MG/2ML IJ SOLN
4.0000 mg | Freq: Four times a day (QID) | INTRAMUSCULAR | Status: DC | PRN
  Administered 2014-08-19: 4 mg via INTRAVENOUS
  Filled 2014-08-18: qty 2

## 2014-08-18 MED ORDER — AMLODIPINE BESYLATE 10 MG PO TABS
10.0000 mg | ORAL_TABLET | Freq: Every day | ORAL | Status: DC
  Administered 2014-08-18 – 2014-08-20 (×3): 10 mg via ORAL
  Filled 2014-08-18 (×3): qty 1

## 2014-08-18 MED ORDER — FLUTICASONE FUROATE-VILANTEROL 100-25 MCG/INH IN AEPB: 1.0000 | INHALATION_SPRAY | Freq: Every day | RESPIRATORY_TRACT | Status: DC

## 2014-08-18 MED ORDER — PANTOPRAZOLE SODIUM 40 MG PO TBEC
40.0000 mg | DELAYED_RELEASE_TABLET | Freq: Every day | ORAL | Status: DC
  Administered 2014-08-18 – 2014-08-20 (×3): 40 mg via ORAL
  Filled 2014-08-18 (×3): qty 1

## 2014-08-18 MED ORDER — MONTELUKAST SODIUM 10 MG PO TABS
10.0000 mg | ORAL_TABLET | Freq: Every day | ORAL | Status: DC
  Administered 2014-08-18 – 2014-08-19 (×2): 10 mg via ORAL
  Filled 2014-08-18 (×2): qty 1

## 2014-08-18 MED ORDER — OXYCODONE-ACETAMINOPHEN 5-325 MG PO TABS: 1.0000 | ORAL_TABLET | Freq: Three times a day (TID) | ORAL | Status: DC | PRN

## 2014-08-18 MED ORDER — NITROGLYCERIN 0.4 MG SL SUBL
0.4000 mg | SUBLINGUAL_TABLET | SUBLINGUAL | Status: DC | PRN
  Administered 2014-08-18: 0.4 mg via SUBLINGUAL

## 2014-08-18 MED ORDER — LOSARTAN POTASSIUM 50 MG PO TABS
50.0000 mg | ORAL_TABLET | Freq: Every day | ORAL | Status: DC
  Administered 2014-08-18 – 2014-08-20 (×3): 50 mg via ORAL
  Filled 2014-08-18 (×3): qty 1

## 2014-08-18 MED ORDER — ACETAMINOPHEN 325 MG PO TABS: 650.0000 mg | ORAL_TABLET | ORAL | Status: DC | PRN

## 2014-08-18 MED ORDER — ALPRAZOLAM 0.5 MG PO TABS
1.0000 mg | ORAL_TABLET | Freq: Three times a day (TID) | ORAL | Status: DC | PRN
  Administered 2014-08-18 – 2014-08-19 (×3): 1 mg via ORAL
  Filled 2014-08-18 (×3): qty 2

## 2014-08-18 MED ORDER — LORAZEPAM 2 MG/ML IJ SOLN
0.5000 mg | Freq: Once | INTRAMUSCULAR | Status: AC
  Administered 2014-08-18: 0.5 mg via INTRAVENOUS
  Filled 2014-08-18: qty 1

## 2014-08-18 MED ORDER — FENTANYL CITRATE 0.05 MG/ML IJ SOLN
50.0000 ug | Freq: Once | INTRAMUSCULAR | Status: AC
  Administered 2014-08-18: 50 ug via INTRAVENOUS
  Filled 2014-08-18: qty 2

## 2014-08-18 MED ORDER — DOXYCYCLINE HYCLATE 100 MG PO TABS
100.0000 mg | ORAL_TABLET | Freq: Two times a day (BID) | ORAL | Status: DC
  Administered 2014-08-18 – 2014-08-20 (×4): 100 mg via ORAL
  Filled 2014-08-18 (×4): qty 1

## 2014-08-18 MED ORDER — FENTANYL CITRATE 0.05 MG/ML IJ SOLN
25.0000 ug | INTRAMUSCULAR | Status: DC | PRN
Start: 2014-08-18 — End: 2014-08-20

## 2014-08-18 MED ORDER — TAMSULOSIN HCL 0.4 MG PO CAPS
0.4000 mg | ORAL_CAPSULE | Freq: Two times a day (BID) | ORAL | Status: DC
  Administered 2014-08-18 – 2014-08-20 (×4): 0.4 mg via ORAL
  Filled 2014-08-18 (×4): qty 1

## 2014-08-18 MED ORDER — ENOXAPARIN SODIUM 40 MG/0.4ML ~~LOC~~ SOLN
40.0000 mg | SUBCUTANEOUS | Status: DC
Start: 2014-08-18 — End: 2014-08-20
  Administered 2014-08-18 – 2014-08-19 (×2): 40 mg via SUBCUTANEOUS
  Filled 2014-08-18 (×2): qty 0.4

## 2014-08-18 MED ORDER — IPRATROPIUM-ALBUTEROL 0.5-2.5 (3) MG/3ML IN SOLN
3.0000 mL | RESPIRATORY_TRACT | Status: DC
  Administered 2014-08-18: 3 mL via RESPIRATORY_TRACT
  Filled 2014-08-18: qty 3

## 2014-08-18 MED ORDER — LORATADINE 10 MG PO TABS
10.0000 mg | ORAL_TABLET | Freq: Every day | ORAL | Status: DC
  Administered 2014-08-18 – 2014-08-20 (×3): 10 mg via ORAL
  Filled 2014-08-18 (×3): qty 1

## 2014-08-18 MED ORDER — FAMOTIDINE 20 MG PO TABS
20.0000 mg | ORAL_TABLET | Freq: Two times a day (BID) | ORAL | Status: DC
  Administered 2014-08-18 – 2014-08-20 (×4): 20 mg via ORAL
  Filled 2014-08-18 (×4): qty 1

## 2014-08-18 MED ORDER — NITROGLYCERIN 0.4 MG SL SUBL
SUBLINGUAL_TABLET | SUBLINGUAL | Status: AC
  Administered 2014-08-18: 0.4 mg via SUBLINGUAL
  Filled 2014-08-18: qty 3

## 2014-08-18 MED ORDER — IPRATROPIUM-ALBUTEROL 0.5-2.5 (3) MG/3ML IN SOLN: 3.0000 mL | RESPIRATORY_TRACT | Status: DC

## 2014-08-18 NOTE — ED Notes (Signed)
[facility] here for transport to [facility]

## 2014-08-18 NOTE — ED Notes (Signed)
Attempt to call report to [facility] not available

## 2014-08-18 NOTE — ED Notes (Signed)
Pt states he thinks he is having a reaction to new med-levaquin x 1 approx 8am-SOB with diarrhea started 915

## 2014-08-18 NOTE — ED Notes (Signed)
Report given to [facility] [place] [address] NO questions

## 2014-08-18 NOTE — ED Provider Notes (Signed)
TIME SEEN: 12:30 PM  CHIEF COMPLAINT: chest pain  HPI: Pt is a 63 y.o. M with history of hypertension, prediabetes, prior tobacco use, prostate cancer who presents to the emergency department with complaints of episode of diffuse chest tightness without radiation, shortness of breath, nausea and vomiting, diaphoresis and lightheadedness while walking out of a closed hardware shop today. He reports that he used his inhalers without relief. He states that pain improved slightly with rest but is not completely gone. Denies any fevers or cough. No lower extremity swelling or pain. No prior history of PE or DVT. He did have a stress test in August 2015 which was normal. Denies a history of cardiac disease.  PCP Dr. Hassell Done with [facility]  ROS: See HPI Constitutional: no fever  Eyes: no drainage  ENT: no runny nose   Cardiovascular:   chest pain  Resp:  SOB  GI: no vomiting GU: no dysuria Integumentary: no rash  Allergy: no hives  Musculoskeletal: no leg swelling  Neurological: no slurred speech ROS otherwise negative  PAST MEDICAL HISTORY/PAST SURGICAL HISTORY:  Past Medical History  Diagnosis Date  . Hypertension   . Prostate cancer   . GERD (gastroesophageal reflux disease)   . History of TB (tuberculosis)     1984--  hospitalized for 4 month treatment  . Post-polio syndrome     polio at age 58  . History of chronic bronchitis   . History of rheumatic fever   . COPD, frequent exacerbations     pulmologist-  dr Joya Gaskins--  Gold Stage C  . Bladder outlet obstruction   . Anxiety disorder   . History of urinary retention   . Shortness of breath dyspnea   . Nocturnal oxygen desaturation     USES O2 NIGHTLY  . Prostate cancer     MEDICATIONS:  Prior to Admission medications   Medication Sig Start Date End Date Taking? Authorizing Provider  Aclidinium Bromide (TUDORZA PRESSAIR) 400 MCG/ACT AEPB One puff twice daily 07/08/14   Elsie Stain, MD  albuterol (PROVENTIL HFA;VENTOLIN  HFA) 108 (90 BASE) MCG/ACT inhaler Inhale 1-2 puffs into the lungs every 6 (six) hours as needed for wheezing or shortness of breath. PROAIR 06/04/14   Brunetta Jeans, PA-C  albuterol (PROVENTIL) (2.5 MG/3ML) 0.083% nebulizer solution Take 2.5 mg by nebulization every 6 (six) hours as needed for wheezing or shortness of breath. Pt uses 2-3X daily    Historical Provider, MD  ALPRAZolam (XANAX) 1 MG tablet Take 1 tablet (1 mg total) by mouth 3 (three) times daily as needed for anxiety. 06/03/14   Brunetta Jeans, PA-C  amLODipine (NORVASC) 10 MG tablet TAKE 1 TABLET (10 MG TOTAL) BY MOUTH DAILY. Patient taking differently: TAKE 1 TABLET (10 MG TOTAL) BY MOUTH DAILY.--  takes in am 07/26/14   Brunetta Jeans, PA-C  EPINEPHrine (EPIPEN) 0.3 mg/0.3 mL IJ SOAJ injection Inject 0.3 mLs (0.3 mg total) into the muscle once. 04/05/14   Brunetta Jeans, PA-C  famotidine (PEPCID) 20 MG tablet TAKE 1 TABLET (20 MG TOTAL) BY MOUTH 2 (TWO) TIMES DAILY. Patient taking differently: TAKE 1 TABLET (20 MG TOTAL) BY MOUTH 2 (TWO) TIMES DAILY.---  per pt takes only in pm 08/06/14   Brunetta Jeans, PA-C  fluticasone [facility]) 50 MCG/ACT nasal spray Place 2 sprays into both nostrils daily. 07/08/14   Elsie Stain, MD  Fluticasone Furoate-Vilanterol (BREO ELLIPTA) 100-25 MCG/INH AEPB INHALE 1 PUFF DAILY AND RINSE MOUTH AFTER USE. 06/04/14  Brunetta Jeans, PA-C  loratadine (CLARITIN) 10 MG tablet TAKE 1 TABLET BY MOUTH EVERY DAY as needed 07/04/14   Brunetta Jeans, PA-C  losartan (COZAAR) 50 MG tablet TAKE 1 TABLET (50 MG TOTAL) BY MOUTH DAILY. Patient taking differently: TAKE 1 TABLET (50 MG TOTAL) BY MOUTH DAILY.--  takes in am 08/12/14   Brunetta Jeans, PA-C  montelukast (SINGULAIR) 10 MG tablet Take 10 mg by mouth at bedtime.    Historical Provider, MD  omeprazole (PRILOSEC) 20 MG capsule Take 1 capsule (20 mg total) by mouth daily. 07/08/14   Elsie Stain, MD  oxyCODONE-acetaminophen (PERCOCET) 10-325 MG  per tablet Take 1 tablet by mouth every 8 (eight) hours as needed for pain. 07/02/14   Brunetta Jeans, PA-C  promethazine (PHENERGAN) 12.5 MG tablet Take 1 tablet (12.5 mg total) by mouth every 8 (eight) hours as needed for nausea or vomiting. 07/13/14   Brunetta Jeans, PA-C  sildenafil (VIAGRA) 100 MG tablet Take 0.5 tablets (50 mg total) by mouth daily as needed for erectile dysfunction. 07/13/14   Brunetta Jeans, PA-C  SPIRIVA HANDIHALER 18 MCG inhalation capsule USE 1 PUFF BY MOUTH EVERY DAY 08/05/14   Elsie Stain, MD  tamsulosin (FLOMAX) 0.4 MG CAPS capsule Take 0.4 mg by mouth 2 (two) times daily.  05/31/14   Historical Provider, MD    ALLERGIES:  Allergies  Allergen Reactions  . Ivp Dye [Iodinated Diagnostic Agents] Anaphylaxis  . Penicillins Anaphylaxis    Heart stops  . Aspirin Other (See Comments)    Reaction unknown  . Morphine And Related Nausea And Vomiting  . Nsaids Other (See Comments)    Difficulty breathing    SOCIAL HISTORY:  History  Substance Use Topics  . Smoking status: Former Smoker -- 37 years    Quit date: 08/25/2013  . Smokeless tobacco: Never Used     Comment: hx  smoke a pack of pipe tobacco(1.5oz) every 10 days.   . Alcohol Use: 12.6 oz/week    21 Cans of beer per week     Comment: average 3 beers per day    FAMILY HISTORY: Family History  Problem Relation Age of Onset  . Alzheimer's disease Father 4    Deceased  . Stomach cancer Father   . Heart attack Father   . Heart disease Father   . Skin cancer Mother     Facial-Living  . Alcohol abuse Sister     x2  . Mental illness Sister     x2  . Diabetes Maternal Aunt     x2  . Thyroid disease Maternal Aunt     x4  . Diabetes Maternal Uncle   . Tuberculosis Paternal Grandfather   . Tuberculosis Paternal Grandmother   . Alzheimer's disease Paternal Aunt   . Alzheimer's disease Paternal Uncle     EXAM: BP 143/82 mmHg  Pulse 95  Temp(Src) 97 F (36.1 C) (Oral)  Resp 20  Ht 6'  1" (1.854 m)  Wt 216 lb (97.977 kg)  BMI 28.50 kg/m2  SpO2 97% CONSTITUTIONAL: Alert and oriented and responds appropriately to questions. Well-appearing; well-nourished HEAD: Normocephalic EYES: Conjunctivae clear, PERRL ENT: normal nose; no rhinorrhea; moist mucous membranes; pharynx without lesions noted NECK: Supple, no meningismus, no LAD  CARD: RRR; S1 and S2 appreciated; no murmurs, no clicks, no rubs, no gallops; very mild tenderness over the anterior chest wall but pain is not completely reproducible and there is no crepitus or ecchymosis  or deformity or lesions noted RESP: Normal chest excursion without splinting or tachypnea; breath sounds clear and equal bilaterally; no wheezes, no rhonchi, no rales, good aeration diffusely, no hypoxia or respiratory distress, no increased work of breathing ABD/GI: Normal bowel sounds; non-distended; soft, non-tender, no rebound, no guarding BACK:  The back appears normal and is non-tender to palpation, there is no CVA tenderness EXT: Normal ROM in all joints; non-tender to palpation; no edema; normal capillary refill; no cyanosis; no calf tenderness    SKIN: Normal color for age and race; warm NEURO: Moves all extremities equally PSYCH: The patient's mood and manner are appropriate. Grooming and personal hygiene are appropriate.  MEDICAL DECISION MAKING: Patient here with concerning story for chest pain. He does have multiple risk factors for ACS but did have a recent negative stress test in August 2015. He also has a history of prostate cancer and PE is also on the differential but he is allergic to IV contrast. We are unable to obtain a VQ scan at [facility]. We'll give nitroglycerin, obtain cardiac labs, chest x-ray. Anticipate patient will need admission at least for a ACS rule out given his story with risk factors.  ED PROGRESS: Patient is now pain-free after nitroglycerin and fentanyl. Unable to receive aspirin secondary to allergy.  Cardiac labs are unremarkable. Chest x-ray is clear. EKG shows no new ischemic changes. Discussed with Dr. Wynelle Cleveland at [facility] for admission to telemetry, observation. Patient agrees with this plan.      EKG Interpretation  Date/Time:  Wednesday August 18 2014 11:35:38 EST Ventricular Rate:  80 PR Interval:  164 QRS Duration: 96 QT Interval:  378 QTC Calculation: 435 R Axis:   43 Text Interpretation:  Normal sinus rhythm Normal ECG Confirmed by Braeleigh Pyper,  DO, Deshanta Lady [phone]) on 08/18/2014 11:57:40 AM          EKG Interpretation  Date/Time:  Wednesday August 18 2014 13:11:01 EST Ventricular Rate:  76 PR Interval:  170 QRS Duration: 92 QT Interval:  394 QTC Calculation: 443 R Axis:   48 Text Interpretation:  Sinus rhythm with Premature atrial complexes Otherwise normal ECG Confirmed by Makinze Jani,  DO, Horton Ellithorpe ([id number]) on 08/18/2014 1:32:11 PM        [place], DO 08/18/14 1457

## 2014-08-18 NOTE — H&P (Signed)
Active Problems Problems  1. Benign prostatic hyperplasia with urinary obstruction (N40.1) 2. Erectile dysfunction due to arterial insufficiency (N52.01) 3. Hypogonadism, testicular (E29.1) 4. Klinefelter syndrome (Q98.4) 5. Prostate cancer (C61) 6. Pyuria (N39.0) 7. Testicular atrophy (N50.0) 8. Undescended testicle (Q53.9) 9. Urinary retention (R33.9) 10. Weak urinary stream (R39.12)  History of Present Illness [name] returns today with a recent episode of urinary retention after getting IV fluids for dehydration.  He had a foley placed on Friday for a PVR of several hundred ml. He remains on tamsulosin but it has been increased to bid.   Past Medical History Problems  1. History of Anxiety (F41.9) 2. History of Arthritis 3. History of Asthma (J45.909) 4. History of Chronic obstructive pulmonary disease (J44.9) 5. History of Gout 6. History of depression (Z86.59) 7. History of esophageal reflux (Z87.19) 8. History of hypertension (Z86.79) 9. History of kidney stones (Z87.442) 10. History of post-polio syndrome (Z86.12) 11. History of Hypogonadism, testicular (E29.1) 12. History of Klinefelters syndrome (Q98.4) 13. History of Tuberculosis  Surgical History Problems  1. History of Biopsy Of The Prostate Needle 2. History of Cystoscopy (Diagnostic) 3. History of Cystoscopy (For Therapy) 4. History of Leg Repair 5. History of Lithotomy  Current Meds 1. Albuterol 90 MCG/ACT AERS;  Therapy: (Recorded:17May2010) to Recorded 2. ALPRAZolam 0.5 MG Oral Tablet;  Therapy: (Recorded:15Oct2015) to Recorded 3. AmLODIPine Besylate TABS;  Therapy: (Recorded:15Oct2015) to Recorded 4. Breo Ellipta AEPB;  Therapy: (Recorded:15Oct2015) to Recorded 5. Ciprofloxacin HCl TABS;  Therapy: (Recorded:27Oct2015) to Recorded 6. Levofloxacin 500 MG Oral Tablet; Take one tablet daily starting day before procedure;  Therapy: 21Oct2015 to (Evaluate:24Oct2015)  Requested for: 21Oct2015; Last  Rx:21Oct2015 Ordered 7. Losartan Potassium TABS;  Therapy: (Recorded:15Oct2015) to Recorded 8. NexIUM 10 MG Oral Packet;  Therapy: (Recorded:17May2010) to Recorded 9. Nitroglycerin 0.4 MG SUBL;  Therapy: (Recorded:17May2010) to Recorded 10. Omeprazole 40 MG Oral Capsule Delayed Release;   Therapy: (Recorded:15Oct2015) to Recorded 11. Oxycodone-Acetaminophen 5-325 MG Oral Tablet;   Therapy: (Recorded:15Oct2015) to Recorded 12. Oxygen Use;   Therapy: (Recorded:15Oct2015) to Recorded 13. Pepcid TABS;   Therapy: (Recorded:15Oct2015) to Recorded 14. ProAir HFA AERS;   Therapy: (Recorded:15Oct2015) to Recorded 15. Singulair 10 MG Oral Tablet;   Therapy: (Recorded:17May2010) to Recorded 16. Spiriva HandiHaler 18 MCG Inhalation Capsule;   Therapy: (Recorded:17May2010) to Recorded 17. Tamsulosin HCl - 0.4 MG Oral Capsule;   Therapy: (Recorded:15Oct2015) to Recorded  Allergies Medication  1. Penicillins 2. NSAIDs Non-Medication  3. Contrast Dye  Family History Problems  1. Family history of Alzheimer's Disease : Father 2. Family history of Death In The Family Father : Father   Alzheimer'sAge 88 3. Family history of Family Health Status - Mother's Age : Mother   age 60 4. Family history of Skin Cancer : Mother  Social History Problems  1. Alcohol Use   6-12 daily. 2. Denied: History of Caffeine Use 3. Former smoker [phone]) 4. Former tobacco use (Z87.891) 5. Married 6. Occupation:   Maintenance 7. Tobacco Use   Smokes Pipe TobaccoStopped Cigarette Smoking 45 yrs ago  Review of Systems  Gastrointestinal: vomiting and diarrhea. when he was in retention.  Constitutional: no fever.    Vitals Vital Signs [Data Includes: Last 1 Day]  Recorded: 27Oct2015 08:58AM  Blood Pressure: 128 / 80 Temperature: 98.2 F Heart Rate: 69  Results/Data  Flow Rate: Instilled volume 240 ml . Voided 200 ml. A peak flow rate of 72ml/s, mean flow rate of 40ml/s and He had a lot of  artifact peaks.  flow curve .    Procedure He was filled with 257ml and voided 251ml. The flowrate had a lot of artifact.     Assessment Assessed  1. Urinary retention (R33.9) 2. Benign prostatic hyperplasia with urinary obstruction (N40.1)  He passed his voiding trial today.   Plan Benign prostatic hyperplasia with urinary obstruction  1. Renew: Tamsulosin HCl - 0.4 MG Oral Capsule; TAKE 2 CAPSULE Daily Urinary retention  2. Follow-up Keep Future Appt Office  Follow-up  Status: Hold For - Appointment  Requested  for: 27Oct2015  I have sent a refill of the tamsulosin with BID dosing.  He will return next week for his prostate biopsy and cystoscopy.   Signatures

## 2014-08-18 NOTE — Plan of Care (Signed)
Called by Dr Leonides Schanz in regards to admitting this patient who comes in with a complaint of chest pain, diaphoresis and dyspnea. He attempted to use his Pro-air inhaler to help resolve his symptoms but it was ineffective. He received Fentanyl and Nitro in the ER with resolved his pain.  He underwent a stress test in Aug of this year which was normal. EKG shows not changes consistent with ischemia. Due to have a prostate biopsy as outpt tomorrow. Will be placed on telemetry to observe overnight.  [facility] to call [facility] upon arrival.   Debbe Odea, MD

## 2014-08-18 NOTE — ED Notes (Signed)
Patient states that he talked to the prescribing dr and was advised to come to the ER and not take the abx any further. Having surgery tomorrow for prostate cancer.

## 2014-08-18 NOTE — H&P (Signed)
History and Physical       Hospital Admission Note Date: 08/18/2014  Patient name: [name] Medical record number: [id number] Date of birth: [birth year] Age: 63 y.o. Gender: male  PCP: Leeanne Rio, PA-C    Chief Complaint:  CHEST PAIN WITH SHORTNESS OF BREATH  HPI: The patient is a 63 year old male with history of hypertension, prior tobacco use, history of prostate CA presented to [facility] with complaints of chest tightness, shortness of breath, nausea and vomiting today. History was obtained from the patient reported that around 9 AM he took 1 Levaquin ( prophylactically for prostate biopsy tomorrow), subsequently went to [facility] [employer] hardware store, was walking out of the store when he started having shortness of breath and described as "smothering feeling". He reported diaphoresis, lightheadedness, chest tightness. He denied any fevers or cough. He has a history of COPD, former smoker. Denies any prior history of PE or DVT. Patient had stress test in August 2015, which was normal (by Dr Mare Ferrari). Patient reported constant chest pain until he presented to [facility] where he received nitroglycerin sublingual with no significant effect however improved after he received fentanyl IV.  Review of Systems:  Constitutional: Denies fever, chills, diaphoresis, poor appetite and fatigue.  HEENT: Denies photophobia, eye pain, redness, hearing loss, ear pain, congestion, sore throat, rhinorrhea, sneezing, mouth sores, trouble swallowing, neck pain, neck stiffness and tinnitus.   Respiratory: please see history of present illness Cardiovascular: please see history of present illness Gastrointestinal: + nausea, vomiting, denies abdominal pain, constipation, blood in stool and abdominal distention. +one episode of diarrhea today Genitourinary: Denies dysuria, urgency, frequency, hematuria, flank pain and  difficulty urinating.  Musculoskeletal: Denies myalgias, back pain, joint swelling, arthralgias and gait problem.  Skin: Denies pallor, rash and wound.  Neurological: Denies dizziness, seizures, syncope, weakness, light-headedness, numbness and headaches.  Hematological: Denies adenopathy. Easy bruising, personal or family bleeding history  Psychiatric/Behavioral: Denies suicidal ideation, mood changes, confusion, nervousness, sleep disturbance and agitation, +very anxious  Past Medical History: Past Medical History  Diagnosis Date  . Hypertension   . Prostate cancer   . GERD (gastroesophageal reflux disease)   . History of TB (tuberculosis)     1984--  hospitalized for 4 month treatment  . Post-polio syndrome     polio at age 75  . History of chronic bronchitis   . History of rheumatic fever   . COPD, frequent exacerbations     pulmologist-  dr Joya Gaskins--  Gold Stage C  . Bladder outlet obstruction   . Anxiety disorder   . History of urinary retention   . Shortness of breath dyspnea   . Nocturnal oxygen desaturation     USES O2 NIGHTLY  . Prostate cancer    Past Surgical History  Procedure Laterality Date  . Other surgical history       Muscle & bone Graft/Polio  . Cystoscopy w/ cystogram/  transrectal ultrasound prostate bx  03-22-2009  . Cardiovascular stress test  06-08-2014  dr Mare Ferrari    normal lexiscan study/  no ischemia/  not gated due to PAC's  . Ureterosopy stone extraction  2000  . Nasal septum surgery  2000  . Shoulder arthroscopy with open rotator cuff repair Bilateral 2013  &  1999    removal spurs and bursectomy  . Laparoscopic cholecystectomy  2013    Medications: Prior to Admission medications   Medication Sig Start Date End Date Taking? Authorizing Provider  Aclidinium Bromide (  TUDORZA PRESSAIR) 400 MCG/ACT AEPB One puff twice daily 07/08/14   Elsie Stain, MD  albuterol (PROVENTIL HFA;VENTOLIN HFA) 108 (90 BASE) MCG/ACT inhaler Inhale 1-2 puffs  into the lungs every 6 (six) hours as needed for wheezing or shortness of breath. PROAIR 06/04/14   Brunetta Jeans, PA-C  albuterol (PROVENTIL) (2.5 MG/3ML) 0.083% nebulizer solution Take 2.5 mg by nebulization every 6 (six) hours as needed for wheezing or shortness of breath. Pt uses 2-3X daily    Historical Provider, MD  ALPRAZolam (XANAX) 1 MG tablet Take 1 tablet (1 mg total) by mouth 3 (three) times daily as needed for anxiety. 06/03/14   Brunetta Jeans, PA-C  amLODipine (NORVASC) 10 MG tablet TAKE 1 TABLET (10 MG TOTAL) BY MOUTH DAILY. Patient taking differently: TAKE 1 TABLET (10 MG TOTAL) BY MOUTH DAILY.--  takes in am 07/26/14   Brunetta Jeans, PA-C  EPINEPHrine (EPIPEN) 0.3 mg/0.3 mL IJ SOAJ injection Inject 0.3 mLs (0.3 mg total) into the muscle once. 04/05/14   Brunetta Jeans, PA-C  famotidine (PEPCID) 20 MG tablet TAKE 1 TABLET (20 MG TOTAL) BY MOUTH 2 (TWO) TIMES DAILY. Patient taking differently: TAKE 1 TABLET (20 MG TOTAL) BY MOUTH 2 (TWO) TIMES DAILY.---  per pt takes only in pm 08/06/14   Brunetta Jeans, PA-C  fluticasone [facility]) 50 MCG/ACT nasal spray Place 2 sprays into both nostrils daily. 07/08/14   Elsie Stain, MD  Fluticasone Furoate-Vilanterol (BREO ELLIPTA) 100-25 MCG/INH AEPB INHALE 1 PUFF DAILY AND RINSE MOUTH AFTER USE. 06/04/14   Brunetta Jeans, PA-C  loratadine (CLARITIN) 10 MG tablet TAKE 1 TABLET BY MOUTH EVERY DAY as needed 07/04/14   Brunetta Jeans, PA-C  losartan (COZAAR) 50 MG tablet TAKE 1 TABLET (50 MG TOTAL) BY MOUTH DAILY. Patient taking differently: TAKE 1 TABLET (50 MG TOTAL) BY MOUTH DAILY.--  takes in am 08/12/14   Brunetta Jeans, PA-C  montelukast (SINGULAIR) 10 MG tablet Take 10 mg by mouth at bedtime.    Historical Provider, MD  omeprazole (PRILOSEC) 20 MG capsule Take 1 capsule (20 mg total) by mouth daily. 07/08/14   Elsie Stain, MD  oxyCODONE-acetaminophen (PERCOCET) 10-325 MG per tablet Take 1 tablet by mouth every 8 (eight)  hours as needed for pain. 07/02/14   Brunetta Jeans, PA-C  promethazine (PHENERGAN) 12.5 MG tablet Take 1 tablet (12.5 mg total) by mouth every 8 (eight) hours as needed for nausea or vomiting. 07/13/14   Brunetta Jeans, PA-C  sildenafil (VIAGRA) 100 MG tablet Take 0.5 tablets (50 mg total) by mouth daily as needed for erectile dysfunction. 07/13/14   Brunetta Jeans, PA-C  SPIRIVA HANDIHALER 18 MCG inhalation capsule USE 1 PUFF BY MOUTH EVERY DAY 08/05/14   Elsie Stain, MD  tamsulosin (FLOMAX) 0.4 MG CAPS capsule Take 0.4 mg by mouth 2 (two) times daily.  05/31/14   Historical Provider, MD    Allergies:   Allergies  Allergen Reactions  . Ivp Dye [Iodinated Diagnostic Agents] Anaphylaxis  . Penicillins Anaphylaxis    Heart stops  . Aspirin Other (See Comments)    Reaction unknown  . Morphine And Related Nausea And Vomiting  . Nsaids Other (See Comments)    Difficulty breathing    Social History:  reports that he quit smoking about a year ago. He has never used smokeless tobacco. He reports that he drinks about 12.6 oz of alcohol per week. He reports that he does not  use illicit drugs.  Family History: Family History  Problem Relation Age of Onset  . Alzheimer's disease Father 4    Deceased  . Stomach cancer Father   . Heart attack Father   . Heart disease Father   . Skin cancer Mother     Facial-Living  . Alcohol abuse Sister     x2  . Mental illness Sister     x2  . Diabetes Maternal Aunt     x2  . Thyroid disease Maternal Aunt     x4  . Diabetes Maternal Uncle   . Tuberculosis Paternal Grandfather   . Tuberculosis Paternal Grandmother   . Alzheimer's disease Paternal Aunt   . Alzheimer's disease Paternal Uncle     Physical Exam: Blood pressure 113/74, pulse 72, temperature 97 F (36.1 C), temperature source Oral, resp. rate 19, height 6\' 1"  (1.854 m), weight 97.977 kg (216 lb), SpO2 98 %. General: Alert, awake, oriented x3, in no acute distress. HEENT:  normocephalic, atraumatic, anicteric sclera, pink conjunctiva, pupils equal and reactive to light and accomodation, oropharynx clear Neck: supple, no masses or lymphadenopathy, no goiter, no bruits  Heart: Regular rate and rhythm, without murmurs, rubs or gallops. Lungs: diffuse wheezing bilaterally, chest wall tenderness+ Abdomen: Soft, nontender, nondistended, positive bowel sounds, no masses. Extremities: No clubbing, cyanosis or edema with positive pedal pulses. Neuro: Grossly intact, no focal neurological deficits, strength 5/5 upper and lower extremities bilaterally Psych: alert and oriented x 3, normal mood and affect Skin: no rashes or lesions, warm and dry   LABS on Admission:  Basic Metabolic Panel:  Recent Labs Lab 08/13/14 1556 08/18/14 1155  NA 135 137  K 4.7 4.2  CL 104 101  CO2 21 22  GLUCOSE 74 102*  BUN 13 15  CREATININE 1.18 1.10  CALCIUM 9.4 9.5   Liver Function Tests:  Recent Labs Lab 08/18/14 1155  AST 21  ALT 26  ALKPHOS 76  BILITOT 0.3  PROT 7.4  ALBUMIN 3.9   No results for input(s): LIPASE, AMYLASE in the last 168 hours. No results for input(s): AMMONIA in the last 168 hours. CBC:  Recent Labs Lab 08/18/14 1155  WBC 6.7  HGB 13.4  HCT 38.3*  MCV 91.0  PLT 259   Cardiac Enzymes:  Recent Labs Lab 08/18/14 1155  TROPONINI <0.30   BNP: Invalid input(s): POCBNP CBG: No results for input(s): GLUCAP in the last 168 hours.   Radiological Exams on Admission: Dg Chest 2 View  08/18/2014   CLINICAL DATA:  Shortness of breath and diarrhea following antibiotic ingestion; prostate biopsy tomorrow  EXAM: CHEST  2 VIEW  COMPARISON:  Portable chest X ray of May 24, 2014  FINDINGS: The lungs are adequately inflated and clear. The heart and pulmonary vascularity are within the limits of normal. There is no pleural effusion. The mediastinum is normal in width. The bony thorax is unremarkable.  IMPRESSION: There is no evidence of pneumonia,  CHF, nor other acute cardiopulmonary abnormality.   Electronically Signed   By: David  Martinique   On: 08/18/2014 13:08   EKG: 76, sinus rhythm with PVCs, no ST-T wave changes suggestive of ischemia  Assessment/Plan Principal Problem:   Chest pain atypical, with shortness of breath and wheezing: DDx includes due to acute bronchitis, anxiety attack, r/o PE. Patient had recent stress test negative, EKG normal, troponin 1 set less than 0.3  - admit for observation, telemetry, ruled out acute ACS, serial cardiac enzymes, stat d-dimer - Placed  on scheduled nebs, O2, IV steroids, doxycycline, ativan x1 - NPO after midnight for cardiology evaluation or if troponins positive.  - Obtain echo given dyspnea and CP (rule out any valvular abnormalities,PHTN, cor pulmonale due to history of COPD)  Active Problems: COPD with acute exacerbation/acute bronchitis - Placed on scheduled albuterol and Atrovent nebs, IV Solu-Medrol, doxycycline, Flonase    Hypertension - restart amlodipine, losartan    GERD (gastroesophageal reflux disease) - continue PPI    Former smoker    Bipolar disorder With acute anxiety - Continue Xanax  DVT prophylaxis: Lovenox  CODE STATUS: full code  Family Communication: Admission, patients condition and plan of care including tests being ordered have been discussed with the patient and wife who indicates understanding and agree with the plan and Code Status   Further plan will depend as patient's clinical course evolves and further radiologic and laboratory data become available.   Time Spent on Admission: 1 hour  Carra Brindley M.D. [facility] 08/18/2014, 5:47 PM Pager: [phone]  If 7PM-7AM, please contact night-coverage [email or web address] Password [email or web address]    hE

## 2014-08-19 ENCOUNTER — Encounter (HOSPITAL_COMMUNITY): Payer: Self-pay | Admitting: General Practice

## 2014-08-19 ENCOUNTER — Ambulatory Visit (HOSPITAL_BASED_OUTPATIENT_CLINIC_OR_DEPARTMENT_OTHER): Admission: RE | Admit: 2014-08-19 | Payer: Medicare HMO | Source: Ambulatory Visit | Admitting: Urology

## 2014-08-19 DIAGNOSIS — F419 Anxiety disorder, unspecified: Secondary | ICD-10-CM | POA: Diagnosis present

## 2014-08-19 DIAGNOSIS — Z91041 Radiographic dye allergy status: Secondary | ICD-10-CM | POA: Diagnosis not present

## 2014-08-19 DIAGNOSIS — N529 Male erectile dysfunction, unspecified: Secondary | ICD-10-CM | POA: Diagnosis present

## 2014-08-19 DIAGNOSIS — Z880 Allergy status to penicillin: Secondary | ICD-10-CM | POA: Diagnosis not present

## 2014-08-19 DIAGNOSIS — Z87891 Personal history of nicotine dependence: Secondary | ICD-10-CM | POA: Diagnosis not present

## 2014-08-19 DIAGNOSIS — Z79899 Other long term (current) drug therapy: Secondary | ICD-10-CM | POA: Diagnosis not present

## 2014-08-19 DIAGNOSIS — G14 Postpolio syndrome: Secondary | ICD-10-CM | POA: Diagnosis present

## 2014-08-19 DIAGNOSIS — Z7951 Long term (current) use of inhaled steroids: Secondary | ICD-10-CM | POA: Diagnosis not present

## 2014-08-19 DIAGNOSIS — I10 Essential (primary) hypertension: Secondary | ICD-10-CM | POA: Diagnosis present

## 2014-08-19 DIAGNOSIS — Z9049 Acquired absence of other specified parts of digestive tract: Secondary | ICD-10-CM | POA: Diagnosis present

## 2014-08-19 DIAGNOSIS — J209 Acute bronchitis, unspecified: Secondary | ICD-10-CM | POA: Diagnosis present

## 2014-08-19 DIAGNOSIS — J42 Unspecified chronic bronchitis: Secondary | ICD-10-CM

## 2014-08-19 DIAGNOSIS — Z8546 Personal history of malignant neoplasm of prostate: Secondary | ICD-10-CM | POA: Diagnosis not present

## 2014-08-19 DIAGNOSIS — J441 Chronic obstructive pulmonary disease with (acute) exacerbation: Secondary | ICD-10-CM | POA: Diagnosis present

## 2014-08-19 DIAGNOSIS — K219 Gastro-esophageal reflux disease without esophagitis: Secondary | ICD-10-CM | POA: Diagnosis present

## 2014-08-19 DIAGNOSIS — F319 Bipolar disorder, unspecified: Secondary | ICD-10-CM | POA: Diagnosis present

## 2014-08-19 DIAGNOSIS — Z886 Allergy status to analgesic agent status: Secondary | ICD-10-CM | POA: Diagnosis not present

## 2014-08-19 DIAGNOSIS — Z8611 Personal history of tuberculosis: Secondary | ICD-10-CM | POA: Diagnosis not present

## 2014-08-19 DIAGNOSIS — R079 Chest pain, unspecified: Secondary | ICD-10-CM | POA: Diagnosis present

## 2014-08-19 DIAGNOSIS — Z8612 Personal history of poliomyelitis: Secondary | ICD-10-CM | POA: Diagnosis not present

## 2014-08-19 DIAGNOSIS — J440 Chronic obstructive pulmonary disease with acute lower respiratory infection: Secondary | ICD-10-CM | POA: Diagnosis present

## 2014-08-19 HISTORY — DX: Personal history of other specified conditions: Z87.898

## 2014-08-19 HISTORY — DX: Personal history of other diseases of the circulatory system: Z86.79

## 2014-08-19 HISTORY — DX: Postpolio syndrome: G14

## 2014-08-19 HISTORY — DX: Anxiety disorder, unspecified: F41.9

## 2014-08-19 HISTORY — DX: Personal history of other diseases of the respiratory system: Z87.09

## 2014-08-19 HISTORY — DX: Bladder-neck obstruction: N32.0

## 2014-08-19 HISTORY — DX: Chronic obstructive pulmonary disease with (acute) exacerbation: J44.1

## 2014-08-19 HISTORY — DX: Idiopathic sleep related nonobstructive alveolar hypoventilation: G47.34

## 2014-08-19 HISTORY — DX: Personal history of tuberculosis: Z86.11

## 2014-08-19 LAB — TROPONIN I: Troponin I: <0.3 ng/mL (ref <0.30)

## 2014-08-19 SURGERY — CYSTOSCOPY, FLEXIBLE
Anesthesia: Choice

## 2014-08-19 MED ORDER — CETYLPYRIDINIUM CHLORIDE 0.05 % MT LIQD
7.0000 mL | Freq: Two times a day (BID) | OROMUCOSAL | Status: DC
  Administered 2014-08-19 – 2014-08-20 (×3): 7 mL via OROMUCOSAL

## 2014-08-19 MED ORDER — PREDNISONE 20 MG PO TABS
50.0000 mg | ORAL_TABLET | Freq: Every day | ORAL | Status: DC
  Administered 2014-08-20: 50 mg via ORAL
  Filled 2014-08-19: qty 1
  Filled 2014-08-19: qty 2

## 2014-08-19 MED ORDER — METHYLPREDNISOLONE SODIUM SUCC 40 MG IJ SOLR
40.0000 mg | Freq: Two times a day (BID) | INTRAMUSCULAR | Status: AC
  Administered 2014-08-19: 40 mg via INTRAVENOUS
  Filled 2014-08-19: qty 1

## 2014-08-19 NOTE — Progress Notes (Signed)
[facility] PROGRESS NOTE  [name] GYJ:[id number] DOB: 1951/09/11 DOA: 08/18/2014 PCP: Leeanne Rio, PA-C  Assessment/Plan: 1. Chest pain atypical, with shortness of breath and wheezing: -due to COPD flare/anxiety -continue solumedrol, change to prednisone tomorrow, nebs, doxy -cardiac enzymes negative, given normal myoview in August no further cardiac workup warranted  Active Problems: COPD with acute exacerbation/acute bronchitis -see above   Hypertension - continue amlodipine, losartan   GERD (gastroesophageal reflux disease) - continue PPI   Former smoker   Bipolar disorder With acute anxiety - Continue Xanax  DVT prophylaxis: Lovenox  Code Status:Full Code Family Communication: none at bedside Disposition Plan: home tomorrow   Consultants:  [facility]  HPI/Subjective: Breathing better, no further CP  Objective: Filed Vitals:   08/19/14 0848  BP: 127/73  Pulse: 68  Temp:   Resp: 17    Intake/Output Summary (Last 24 hours) at 08/19/14 1122 Last data filed at 08/18/14 2313  Gross per 24 hour  Intake      0 ml  Output    800 ml  Net   -800 ml   Filed Weights   08/18/14 1125 08/19/14 0542  Weight: 97.977 kg (216 lb) 97.115 kg (214 lb 1.6 oz)    Exam:   General:  AAOx3  Cardiovascular: S1S2/RRR  Respiratory: poor air movt bilaterally  Abdomen: soft, NT, BS present  Musculoskeletal: no edema c/c   Data Reviewed: Basic Metabolic Panel:  Recent Labs Lab 08/13/14 1556 08/18/14 1155 08/18/14 1817  NA 135 137  --   K 4.7 4.2  --   CL 104 101  --   CO2 21 22  --   GLUCOSE 74 102*  --   BUN 13 15  --   CREATININE 1.18 1.10 1.04  CALCIUM 9.4 9.5  --    Liver Function Tests:  Recent Labs Lab 08/18/14 1155  AST 21  ALT 26  ALKPHOS 76  BILITOT 0.3  PROT 7.4  ALBUMIN 3.9   No results for input(s): LIPASE, AMYLASE in the last 168 hours. No results for input(s): AMMONIA in the last 168 hours. CBC:  Recent  Labs Lab 08/18/14 1155 08/18/14 1817  WBC 6.7 5.9  HGB 13.4 12.7*  HCT 38.3* 37.1*  MCV 91.0 90.0  PLT 259 267   Cardiac Enzymes:  Recent Labs Lab 08/18/14 1155 08/18/14 1817 08/18/14 2025 08/18/14 2345  TROPONINI <0.30 <0.30 <0.30 <0.30   BNP (last 3 results) No results for input(s): PROBNP in the last 8760 hours. CBG: No results for input(s): GLUCAP in the last 168 hours.  No results found for this or any previous visit (from the past 240 hour(s)).   Studies: Dg Chest 2 View  08/18/2014   CLINICAL DATA:  Shortness of breath and diarrhea following antibiotic ingestion; prostate biopsy tomorrow  EXAM: CHEST  2 VIEW  COMPARISON:  Portable chest X ray of May 24, 2014  FINDINGS: The lungs are adequately inflated and clear. The heart and pulmonary vascularity are within the limits of normal. There is no pleural effusion. The mediastinum is normal in width. The bony thorax is unremarkable.  IMPRESSION: There is no evidence of pneumonia, CHF, nor other acute cardiopulmonary abnormality.   Electronically Signed   By: David  Martinique   On: 08/18/2014 13:08    Scheduled Meds: . amLODipine  10 mg Oral Daily  . antiseptic oral rinse  7 mL Mouth Rinse BID  . budesonide-formoterol  2 puff Inhalation BID  . doxycycline  100 mg  Oral Q12H  . enoxaparin (LOVENOX) injection  40 mg Subcutaneous Q24H  . famotidine  20 mg Oral BID  . fluticasone  2 spray Each Nare Daily  . ipratropium-albuterol  3 mL Nebulization TID  . loratadine  10 mg Oral Daily  . losartan  50 mg Oral Daily  . methylPREDNISolone (SOLU-MEDROL) injection  60 mg Intravenous Q6H  . montelukast  10 mg Oral QHS  . pantoprazole  40 mg Oral Daily  . tamsulosin  0.4 mg Oral BID   Continuous Infusions:  Antibiotics Given (last 72 hours)    Date/Time Action Medication Dose   08/18/14 2241 Given   doxycycline (VIBRA-TABS) tablet 100 mg 100 mg   08/19/14 0850 Given   doxycycline (VIBRA-TABS) tablet 100 mg 100 mg       Principal Problem:   Chest pain Active Problems:   Hypertension   GERD (gastroesophageal reflux disease)   Former smoker   Bipolar disorder   COPD with acute bronchitis    Time spent: 59min    Asees Manfredi  [facility] Pager [phone]. If 7PM-7AM, please contact night-coverage at [email or web address], password [facility] 08/19/2014, 11:22 AM  LOS: 1 day

## 2014-08-19 NOTE — Consult Note (Signed)
Primary Physician: Primary Cardiologist:   HPI: Asked to see re CP Patient is a 63 yo with hx of HTN, tobacco use.  Came to ER with tightness in chest, SOB, N/  The patient was seen this summer by T Brackbill for chest pain.  Underwent Lexiscan myoview   This was normal . Not gated due to atrial ectopy  "Sunday he had an episode of CP / SOB while at [employer].  Eased off with NTG.  He says that chst discomfort was pleuritic, worsening with deep inspiration.  Did OK after  Yesterday took Levaquin  (per urology) Went to [employer]  While coming back got chst discomfort.  SOB  Smothering sensation.  Had to roll car windows down.  Felt like couldn't get breath.  Hurt worse with breath.  Also developed nausea  At home got severe diarrhea.  Went to ER  Given NTG with no relief  Pain reported relieved by fentanyl.    Patinet has history of COPD  Has uses inhalers.  Also history of sleep apena  Uses CPAP occasionally.     Past Medical History  Diagnosis Date  . Hypertension   . Prostate cancer   . GERD (gastroesophageal reflux disease)   . History of TB (tuberculosis)     18" 83--  hospitalized for 4 month treatment  . Post-polio syndrome     polio at age 18  . History of chronic bronchitis   . History of rheumatic fever   . COPD, frequent exacerbations     pulmologist-  dr Joya Gaskins--  Gold Stage C  . Bladder outlet obstruction   . Anxiety disorder   . History of urinary retention   . Shortness of breath dyspnea   . Nocturnal oxygen desaturation     USES O2 NIGHTLY  . Prostate cancer     Medications Prior to Admission  Medication Sig Dispense Refill  . albuterol (PROVENTIL HFA;VENTOLIN HFA) 108 (90 BASE) MCG/ACT inhaler Inhale 1-2 puffs into the lungs every 6 (six) hours as needed for wheezing or shortness of breath. PROAIR 3 Inhaler 1  . albuterol (PROVENTIL) (2.5 MG/3ML) 0.083% nebulizer solution Take 2.5 mg by nebulization every 6 (six) hours as needed for wheezing or shortness of  breath. Pt uses 2-3X daily    . ALPRAZolam (XANAX) 0.5 MG tablet Take 0.5 mg by mouth at bedtime as needed for anxiety.    . ALPRAZolam (XANAX) 1 MG tablet Take 1 tablet (1 mg total) by mouth 3 (three) times daily as needed for anxiety. 90 tablet 0  . amLODipine (NORVASC) 10 MG tablet Take 10 mg by mouth daily.    . famotidine (PEPCID) 20 MG tablet Take 20 mg by mouth 2 (two) times daily.    . fluticasone (FLONASE) 50 MCG/ACT nasal spray Place 2 sprays into both nostrils daily. 16 g 12  . Fluticasone Furoate-Vilanterol 100-25 MCG/INH AEPB Inhale 1 puff into the lungs daily.    Marland Kitchen loratadine (CLARITIN) 10 MG tablet Take 10 mg by mouth daily as needed for allergies.    Marland Kitchen losartan (COZAAR) 50 MG tablet Take 50 mg by mouth daily.    Marland Kitchen omeprazole (PRILOSEC) 20 MG capsule Take 1 capsule (20 mg total) by mouth daily. 30 capsule 4  . oxyCODONE-acetaminophen (PERCOCET) 10-325 MG per tablet Take 1 tablet by mouth every 8 (eight) hours as needed for pain. 120 tablet 0  . promethazine (PHENERGAN) 12.5 MG tablet Take 1 tablet (12.5 mg total) by mouth every 8 (eight)  hours as needed for nausea or vomiting. 30 tablet 0  . sildenafil (VIAGRA) 100 MG tablet Take 0.5 tablets (50 mg total) by mouth daily as needed for erectile dysfunction. 6 tablet 2  . tamsulosin (FLOMAX) 0.4 MG CAPS capsule Take 0.4 mg by mouth 2 (two) times daily.     Marland Kitchen tiotropium (SPIRIVA) 18 MCG inhalation capsule Place 18 mcg into inhaler and inhale daily.    . Aclidinium Bromide (TUDORZA PRESSAIR) 400 MCG/ACT AEPB One puff twice daily 1 each 6  . EPINEPHrine (EPIPEN) 0.3 mg/0.3 mL IJ SOAJ injection Inject 0.3 mLs (0.3 mg total) into the muscle once. 1 Device 2  . montelukast (SINGULAIR) 10 MG tablet Take 10 mg by mouth at bedtime.    Marland Kitchen SPIRIVA HANDIHALER 18 MCG inhalation capsule USE 1 PUFF BY MOUTH EVERY DAY 30 capsule 6     . amLODipine  10 mg Oral Daily  . antiseptic oral rinse  7 mL Mouth Rinse BID  . budesonide-formoterol  2 puff  Inhalation BID  . doxycycline  100 mg Oral Q12H  . enoxaparin (LOVENOX) injection  40 mg Subcutaneous Q24H  . famotidine  20 mg Oral BID  . fluticasone  2 spray Each Nare Daily  . ipratropium-albuterol  3 mL Nebulization TID  . loratadine  10 mg Oral Daily  . losartan  50 mg Oral Daily  . methylPREDNISolone (SOLU-MEDROL) injection  60 mg Intravenous Q6H  . montelukast  10 mg Oral QHS  . pantoprazole  40 mg Oral Daily  . tamsulosin  0.4 mg Oral BID    Infusions:    Allergies  Allergen Reactions  . Ivp Dye [Iodinated Diagnostic Agents] Anaphylaxis  . Penicillins Anaphylaxis    Heart stops  . Aspirin Other (See Comments)    Reaction unknown  . Morphine And Related Nausea And Vomiting  . Nsaids Other (See Comments)    Difficulty breathing    History   Social History  . Marital Status: Married    Spouse Name: N/A    Number of Children: N/A  . Years of Education: N/A   Occupational History  . Not on file.   Social History Main Topics  . Smoking status: Former Smoker -- 63 years    Quit date: 08/25/2013  . Smokeless tobacco: Never Used     Comment: hx  smoke a pack of pipe tobacco(1.5oz) every 10 days.   . Alcohol Use: 12.6 oz/week    21 Cans of beer per week     Comment: average 3 beers per day  . Drug Use: No  . Sexual Activity: Not on file   Other Topics Concern  . Not on file   Social History Narrative    Family History  Problem Relation Age of Onset  . Alzheimer's disease Father 4    Deceased  . Stomach cancer Father   . Heart attack Father   . Heart disease Father   . Skin cancer Mother     Facial-Living  . Alcohol abuse Sister     x2  . Mental illness Sister     x2  . Diabetes Maternal Aunt     x2  . Thyroid disease Maternal Aunt     x4  . Diabetes Maternal Uncle   . Tuberculosis Paternal Grandfather   . Tuberculosis Paternal Grandmother   . Alzheimer's disease Paternal Aunt   . Alzheimer's disease Paternal Uncle     REVIEW OF SYSTEMS:   All systems reviewed  Negative to  the above problem except as noted above.    PHYSICAL EXAM: Filed Vitals:   08/19/14 0736  BP: 125/65  Pulse: 61  Temp: 97.7 F (36.5 C)  Resp: 16     Intake/Output Summary (Last 24 hours) at 08/19/14 0844 Last data filed at 08/18/14 2313  Gross per 24 hour  Intake      0 ml  Output    800 ml  Net   -800 ml    General:  Well appearing. No respiratory difficulty HEENT: normal Neck: supple. no JVD. Carotids 2+ bilat; no bruits. No lymphadenopathy or thryomegaly appreciated. Cor: PMI nondisplaced. Regular rate & rhythm. No rubs, gallops or murmurs. Lungs: clear Chest   Tender to palpitation in L parasternal area.   Abdomen: soft, nontender, nondistended. No hepatosplenomegaly. No bruits or masses. Good bowel sounds. Extremities: no cyanosis, clubbing, rash, edema Neuro: alert & oriented x 3, cranial nerves grossly intact. moves all 4 extremities w/o difficulty. Affect pleasant.  ECG:  11/4  SR 74 bpm    Results for orders placed or performed during the hospital encounter of 08/18/14 (from the past 24 hour(s))  CBC     Status: Abnormal   Collection Time: 08/18/14 11:55 AM  Result Value Ref Range   WBC 6.7 4.0 - 10.5 K/uL   RBC 4.21 (L) 4.22 - 5.81 MIL/uL   Hemoglobin 13.4 13.0 - 17.0 g/dL   HCT 38.3 (L) 39.0 - 52.0 %   MCV 91.0 78.0 - 100.0 fL   MCH 31.8 26.0 - 34.0 pg   MCHC 35.0 30.0 - 36.0 g/dL   RDW 13.2 11.5 - 15.5 %   Platelets 259 150 - 400 K/uL  Comprehensive metabolic panel     Status: Abnormal   Collection Time: 08/18/14 11:55 AM  Result Value Ref Range   Sodium 137 137 - 147 mEq/L   Potassium 4.2 3.7 - 5.3 mEq/L   Chloride 101 96 - 112 mEq/L   CO2 22 19 - 32 mEq/L   Glucose, Bld 102 (H) 70 - 99 mg/dL   BUN 15 6 - 23 mg/dL   Creatinine, Ser 1.10 0.50 - 1.35 mg/dL   Calcium 9.5 8.4 - 10.5 mg/dL   Total Protein 7.4 6.0 - 8.3 g/dL   Albumin 3.9 3.5 - 5.2 g/dL   AST 21 0 - 37 U/L   ALT 26 0 - 53 U/L   Alkaline Phosphatase  76 39 - 117 U/L   Total Bilirubin 0.3 0.3 - 1.2 mg/dL   GFR calc non Af Amer 69 (L) >90 mL/min   GFR calc Af Amer 80 (L) >90 mL/min   Anion gap 14 5 - 15  Troponin I (order at [facility])     Status: None   Collection Time: 08/18/14 11:55 AM  Result Value Ref Range   Troponin I <0.30 <0.30 ng/mL  Troponin I-serum (0, 3, 6 hours)     Status: None   Collection Time: 08/18/14  6:17 PM  Result Value Ref Range   Troponin I <0.30 <0.30 ng/mL  CBC     Status: Abnormal   Collection Time: 08/18/14  6:17 PM  Result Value Ref Range   WBC 5.9 4.0 - 10.5 K/uL   RBC 4.12 (L) 4.22 - 5.81 MIL/uL   Hemoglobin 12.7 (L) 13.0 - 17.0 g/dL   HCT 37.1 (L) 39.0 - 52.0 %   MCV 90.0 78.0 - 100.0 fL   MCH 30.8 26.0 - 34.0 pg   MCHC 34.2 30.0 -  36.0 g/dL   RDW 13.5 11.5 - 15.5 %   Platelets 267 150 - 400 K/uL  Creatinine, serum     Status: Abnormal   Collection Time: 08/18/14  6:17 PM  Result Value Ref Range   Creatinine, Ser 1.04 0.50 - 1.35 mg/dL   GFR calc non Af Amer 74 (L) >90 mL/min   GFR calc Af Amer 86 (L) >90 mL/min  D-dimer, quantitative     Status: None   Collection Time: 08/18/14  6:17 PM  Result Value Ref Range   D-Dimer, Quant 0.36 0.00 - 0.48 ug/mL-FEU  Troponin I-serum (0, 3, 6 hours)     Status: None   Collection Time: 08/18/14  8:25 PM  Result Value Ref Range   Troponin I <0.30 <0.30 ng/mL  Troponin I-serum (0, 3, 6 hours)     Status: None   Collection Time: 08/18/14 11:45 PM  Result Value Ref Range   Troponin I <0.30 <0.30 ng/mL   Dg Chest 2 View  08/18/2014   CLINICAL DATA:  Shortness of breath and diarrhea following antibiotic ingestion; prostate biopsy tomorrow  EXAM: CHEST  2 VIEW  COMPARISON:  Portable chest X ray of May 24, 2014  FINDINGS: The lungs are adequately inflated and clear. The heart and pulmonary vascularity are within the limits of normal. There is no pleural effusion. The mediastinum is normal in width. The bony thorax is unremarkable.  IMPRESSION: There is no  evidence of pneumonia, CHF, nor other acute cardiopulmonary abnormality.   Electronically Signed   By: David  Martinique   On: 08/18/2014 13:08     ASSESSMENT: 1.  Chest pain  Patient is a 63 yo with history of CP  Hx of normal Lexiscan myoview in August. Pain then and now is atypical  Current episode pleuritic  Erratic   Note echo ordered.   Will review  Should not keep pt in hosp  I would treat for musculoskel pain.  Patient overall very anxious when talking about spell  Could have contrib.    2.  Pulmonary  Patient says that he is going to start pulm rehab  Says that he uses 50% lung capacity with breaths.   Encouraged use of CPAP, esp with atrial ectopy (patient has not had afib documented

## 2014-08-19 NOTE — Progress Notes (Signed)
  Echocardiogram 2D Echocardiogram has been performed.  [name] 08/19/2014, 12:44 PM

## 2014-08-19 NOTE — Progress Notes (Signed)
Pt states having CP, seems anxious in regards to NPO status, offered Xanax. Pt denies chest pain after Xanax.

## 2014-08-19 NOTE — Progress Notes (Signed)
UR completed 

## 2014-08-20 ENCOUNTER — Telehealth: Payer: Self-pay | Admitting: Physician Assistant

## 2014-08-20 DIAGNOSIS — R079 Chest pain, unspecified: Secondary | ICD-10-CM | POA: Insufficient documentation

## 2014-08-20 MED ORDER — PREDNISONE 20 MG PO TABS: 50.0000 mg | ORAL_TABLET | Freq: Every day | ORAL | Status: DC

## 2014-08-20 MED ORDER — DOXYCYCLINE HYCLATE 100 MG PO TABS: 100.0000 mg | ORAL_TABLET | Freq: Two times a day (BID) | ORAL | Status: DC

## 2014-08-20 NOTE — Discharge Summary (Signed)
Physician Discharge Summary  [name] UXN:[id number] DOB: December 29, 1950 DOA: 08/18/2014  PCP: Leeanne Rio, PA-C  Admit date: 08/18/2014 Discharge date: 08/20/2014  Time spent: 45 minutes  Recommendations for Outpatient Follow-up:  1. PCP in 1 week  Discharge Diagnoses:  Principal Problem:   Chest pain   COPD exacerbation   Anxiety   Hypertension   GERD (gastroesophageal reflux disease)   Former smoker   Bipolar disorder   COPD with acute bronchitis   Discharge Condition: stable  Diet recommendation: low sodium  Filed Weights   08/18/14 1125 08/19/14 0542 08/20/14 0440  Weight: 97.977 kg (216 lb) 97.115 kg (214 lb 1.6 oz) 97.342 kg (214 lb 9.6 oz)    History of present illness:  The patient is a 63 year old male with history of hypertension, prior tobacco use, history of prostate CA presented to [facility] ED with complaints of chest tightness, shortness of breath, nausea and vomiting. History was obtained from the patient reported that around 9 AM he took 1 Levaquin ( prophylactically for prostate biopsy 11/5), subsequently went to [facility] hardware store, was walking out of the store when he started having shortness of breath and described as "smothering feeling". He reported diaphoresis, lightheadedness, chest tightness. He denied any fevers or cough. He has a history of COPD, former smoker. Denies any prior history of PE or DVT. Patient had stress test in August 2015, which was normal (by Dr Mare Ferrari). Patient reported constant chest pain until he presented to [facility] ED where he received nitroglycerin sublingual with no significant effect however improved after he received fentanyl IV  Hospital Course:  1. Chest pain atypical, with shortness of breath and wheezing: -due to COPD flare/anxiety -improved -treated with IV solumedrol/Abx, changed to prednisone, nebs, doxy -cardiac enzymes negative, seen by Cardiology, ECHO with normal EF and wall  motion, since he had a normal myoview in August'15, no further cardiac workup warranted  Active Problems: COPD with acute exacerbation/acute bronchitis -see above -improved   Hypertension -stable - continue amlodipine, losartan   GERD (gastroesophageal reflux disease) - continue PPI   Former smoker   Bipolar disorder/Anxiety - Continue Xanax  Procedures:  ECHO: normal EF and wall motion  Consultations: Cards  Discharge Exam: Filed Vitals:   08/20/14 0958  BP: 120/50  Pulse:   Temp:   Resp:     General: AAOx3 Cardiovascular: S1S2/RRR Respiratory: CTAB  Discharge Instructions You were cared for by a hospitalist during your hospital stay. If you have any questions about your discharge medications or the care you received while you were in the hospital after you are discharged, you can call the unit and asked to speak with the hospitalist on call if the hospitalist that took care of you is not available. Once you are discharged, your primary care physician will handle any further medical issues. Please note that NO REFILLS for any discharge medications will be authorized once you are discharged, as it is imperative that you return to your primary care physician (or establish a relationship with a primary care physician if you do not have one) for your aftercare needs so that they can reassess your need for medications and monitor your lab values.  Discharge Instructions    Diet - low sodium heart healthy    Complete by:  As directed      Increase activity slowly    Complete by:  As directed           Current Discharge  Medication List    START taking these medications   Details  doxycycline (VIBRA-TABS) 100 MG tablet Take 1 tablet (100 mg total) by mouth every 12 (twelve) hours. For 3days Qty: 6 tablet, Refills: 0    predniSONE (DELTASONE) 20 MG tablet Take 2.5 tablets (50 mg total) by mouth daily with breakfast. Take 40mg  for 2days then 20mg  for 2days then  STOP Qty: 6 tablet, Refills: 0      CONTINUE these medications which have NOT CHANGED   Details  albuterol (PROVENTIL HFA;VENTOLIN HFA) 108 (90 BASE) MCG/ACT inhaler Inhale 1-2 puffs into the lungs every 6 (six) hours as needed for wheezing or shortness of breath. PROAIR Qty: 3 Inhaler, Refills: 1    albuterol (PROVENTIL) (2.5 MG/3ML) 0.083% nebulizer solution Take 2.5 mg by nebulization every 6 (six) hours as needed for wheezing or shortness of breath. Pt uses 2-3X daily    !! ALPRAZolam (XANAX) 0.5 MG tablet Take 0.5 mg by mouth at bedtime as needed for anxiety.    !! ALPRAZolam (XANAX) 1 MG tablet Take 1 tablet (1 mg total) by mouth 3 (three) times daily as needed for anxiety. Qty: 90 tablet, Refills: 0   Associated Diagnoses: Anxiety    amLODipine (NORVASC) 10 MG tablet Take 10 mg by mouth daily.    famotidine (PEPCID) 20 MG tablet Take 20 mg by mouth 2 (two) times daily.    fluticasone (FLONASE) 50 MCG/ACT nasal spray Place 2 sprays into both nostrils daily. Qty: 16 g, Refills: 12    Fluticasone Furoate-Vilanterol 100-25 MCG/INH AEPB Inhale 1 puff into the lungs daily.    loratadine (CLARITIN) 10 MG tablet Take 10 mg by mouth daily as needed for allergies.    losartan (COZAAR) 50 MG tablet Take 50 mg by mouth daily.    omeprazole (PRILOSEC) 20 MG capsule Take 1 capsule (20 mg total) by mouth daily. Qty: 30 capsule, Refills: 4    oxyCODONE-acetaminophen (PERCOCET) 10-325 MG per tablet Take 1 tablet by mouth every 8 (eight) hours as needed for pain. Qty: 120 tablet, Refills: 0    promethazine (PHENERGAN) 12.5 MG tablet Take 1 tablet (12.5 mg total) by mouth every 8 (eight) hours as needed for nausea or vomiting. Qty: 30 tablet, Refills: 0   Associated Diagnoses: Nausea alone    sildenafil (VIAGRA) 100 MG tablet Take 0.5 tablets (50 mg total) by mouth daily as needed for erectile dysfunction. Qty: 6 tablet, Refills: 2   Associated Diagnoses: Organic erectile dysfunction     tamsulosin (FLOMAX) 0.4 MG CAPS capsule Take 0.4 mg by mouth 2 (two) times daily.     !! tiotropium (SPIRIVA) 18 MCG inhalation capsule Place 18 mcg into inhaler and inhale daily.    Aclidinium Bromide (TUDORZA PRESSAIR) 400 MCG/ACT AEPB One puff twice daily Qty: 1 each, Refills: 6    montelukast (SINGULAIR) 10 MG tablet Take 10 mg by mouth at bedtime.    !! SPIRIVA HANDIHALER 18 MCG inhalation capsule USE 1 PUFF BY MOUTH EVERY DAY Qty: 30 capsule, Refills: 6     !! - Potential duplicate medications found. Please discuss with provider.    STOP taking these medications     EPINEPHrine (EPIPEN) 0.3 mg/0.3 mL IJ SOAJ injection        Allergies  Allergen Reactions  . Ivp Dye [Iodinated Diagnostic Agents] Anaphylaxis  . Penicillins Anaphylaxis    Heart stops  . Aspirin Other (See Comments)    Reaction unknown  . Morphine And Related Nausea And Vomiting  .  Nsaids Other (See Comments)    Difficulty breathing   Follow-up Information    Follow up with Leeanne Rio, PA-C. Schedule an appointment as soon as possible for a visit in 1 week.   Specialty:  Physician Assistant   Contact information:   [address] [place] [address] [phone]        The results of significant diagnostics from this hospitalization (including imaging, microbiology, ancillary and laboratory) are listed below for reference.    Significant Diagnostic Studies: Dg Chest 2 View  08/18/2014   CLINICAL DATA:  Shortness of breath and diarrhea following antibiotic ingestion; prostate biopsy tomorrow  EXAM: CHEST  2 VIEW  COMPARISON:  Portable chest X ray of May 24, 2014  FINDINGS: The lungs are adequately inflated and clear. The heart and pulmonary vascularity are within the limits of normal. There is no pleural effusion. The mediastinum is normal in width. The bony thorax is unremarkable.  IMPRESSION: There is no evidence of pneumonia, CHF, nor other acute cardiopulmonary  abnormality.   Electronically Signed   By: David  Martinique   On: 08/18/2014 13:08    Microbiology: No results found for this or any previous visit (from the past 240 hour(s)).   Labs: Basic Metabolic Panel:  Recent Labs Lab 08/13/14 1556 08/18/14 1155 08/18/14 1817  NA 135 137  --   K 4.7 4.2  --   CL 104 101  --   CO2 21 22  --   GLUCOSE 74 102*  --   BUN 13 15  --   CREATININE 1.18 1.10 1.04  CALCIUM 9.4 9.5  --    Liver Function Tests:  Recent Labs Lab 08/18/14 1155  AST 21  ALT 26  ALKPHOS 76  BILITOT 0.3  PROT 7.4  ALBUMIN 3.9   No results for input(s): LIPASE, AMYLASE in the last 168 hours. No results for input(s): AMMONIA in the last 168 hours. CBC:  Recent Labs Lab 08/18/14 1155 08/18/14 1817  WBC 6.7 5.9  HGB 13.4 12.7*  HCT 38.3* 37.1*  MCV 91.0 90.0  PLT 259 267   Cardiac Enzymes:  Recent Labs Lab 08/18/14 1155 08/18/14 1817 08/18/14 2025 08/18/14 2345  TROPONINI <0.30 <0.30 <0.30 <0.30   BNP: BNP (last 3 results) No results for input(s): PROBNP in the last 8760 hours. CBG: No results for input(s): GLUCAP in the last 168 hours.     SignedDomenic Polite  [facility] 08/20/2014, 10:15 AM

## 2014-08-20 NOTE — Plan of Care (Signed)
Problem: Phase I Progression Outcomes Goal: Hemodynamically stable Outcome: Completed/Met Date Met:  08/20/14 Goal: Anginal pain relieved Outcome: Completed/Met Date Met:  08/20/14 Goal: Aspirin unless contraindicated Outcome: Not Applicable Date Met:  84/57/33 Pt is allergic to ASA Goal: MD aware of Cardiac Marker results Outcome: Completed/Met Date Met:  08/20/14 Goal: Voiding-avoid urinary catheter unless indicated Outcome: Completed/Met Date Met:  08/20/14 Goal: Other Phase I Outcomes/Goals Outcome: Completed/Met Date Met:  08/20/14

## 2014-08-20 NOTE — Telephone Encounter (Signed)
error 

## 2014-08-23 ENCOUNTER — Telehealth (HOSPITAL_COMMUNITY): Payer: Self-pay | Admitting: *Deleted

## 2014-08-23 ENCOUNTER — Telehealth: Payer: Self-pay

## 2014-08-23 NOTE — Telephone Encounter (Signed)
Is there anyway to make it a 45 minute appointment? That way I will have time to address everything?

## 2014-08-23 NOTE — Telephone Encounter (Signed)
Done

## 2014-08-23 NOTE — Telephone Encounter (Signed)
Admit date: 08/18/2014 Discharge date: 08/20/2014  Reason for admission:  Chest pain due to COPD exacerbation  Recommendations for Outpatient Follow-up:  1. PCP in 1 week  Transition Care Management Follow-up Telephone Call  How have you been since you were released from the hospital? Pt states he feels much better now.  Denies shortness of breath, chest pain, n/v, diaphoresis, and diarrhea.  He feels the symptoms were due to Levaquin.  However, he mentioned that he has chest tightness at night.     Do you understand why you were in the hospital? yes   Do you understand the discharge instructions? yes  Items Reviewed:  Medications reviewed: yes, pt is currently not on prednisone as prescribed. Pt states the prescription was not at the pharmacy.    Allergies reviewed: yes  Dietary changes reviewed: yes, low sodium diet  Referrals reviewed: n/a   Functional Questionnaire:   Activities of Daily Living (ADLs):   He states they are independent in the following: ambulation, bathing and hygiene, feeding, continence, grooming, toileting and dressing States they require assistance with the following: n/a   Any transportation issues/concerns?: no   Any patient concerns? no   Confirmed importance and date/time of follow-up visits scheduled: yes   Confirmed with patient if condition begins to worsen call PCP or go to the ER.  Patient was given the Call-a-Nurse line [phone]: yes

## 2014-08-23 NOTE — Telephone Encounter (Signed)
Pt has a surgical clearance appointment already scheduled for 08/25/14 @ 7:30 am.  Should this appointment be changed to a hospital follow up appointment?  Please advise.

## 2014-08-25 ENCOUNTER — Ambulatory Visit: Payer: Medicare HMO | Admitting: Physician Assistant

## 2014-08-25 ENCOUNTER — Telehealth: Payer: Self-pay | Admitting: Physician Assistant

## 2014-08-25 NOTE — Telephone Encounter (Signed)
The patient called this morning to advise he cant drive today as he fell off the roof yesterday at home.  Wonders if he has broken a rib.  Will try to get wife to bring him to the ED today.

## 2014-08-28 ENCOUNTER — Emergency Department (HOSPITAL_BASED_OUTPATIENT_CLINIC_OR_DEPARTMENT_OTHER)
Admission: EM | Admit: 2014-08-28 | Discharge: 2014-08-28 | Disposition: A | Discharge status: Home / Self Care | Payer: Medicare HMO | Attending: Emergency Medicine | Admitting: Emergency Medicine

## 2014-08-28 ENCOUNTER — Encounter (HOSPITAL_BASED_OUTPATIENT_CLINIC_OR_DEPARTMENT_OTHER): Payer: Self-pay | Admitting: *Deleted

## 2014-08-28 ENCOUNTER — Emergency Department (HOSPITAL_BASED_OUTPATIENT_CLINIC_OR_DEPARTMENT_OTHER): Payer: Medicare HMO

## 2014-08-28 DIAGNOSIS — Z8611 Personal history of tuberculosis: Secondary | ICD-10-CM | POA: Diagnosis not present

## 2014-08-28 DIAGNOSIS — Z7952 Long term (current) use of systemic steroids: Secondary | ICD-10-CM | POA: Diagnosis not present

## 2014-08-28 DIAGNOSIS — Y998 Other external cause status: Secondary | ICD-10-CM | POA: Diagnosis not present

## 2014-08-28 DIAGNOSIS — Z87891 Personal history of nicotine dependence: Secondary | ICD-10-CM | POA: Insufficient documentation

## 2014-08-28 DIAGNOSIS — Y9289 Other specified places as the place of occurrence of the external cause: Secondary | ICD-10-CM | POA: Insufficient documentation

## 2014-08-28 DIAGNOSIS — Z8669 Personal history of other diseases of the nervous system and sense organs: Secondary | ICD-10-CM | POA: Diagnosis not present

## 2014-08-28 DIAGNOSIS — Z792 Long term (current) use of antibiotics: Secondary | ICD-10-CM | POA: Insufficient documentation

## 2014-08-28 DIAGNOSIS — K219 Gastro-esophageal reflux disease without esophagitis: Secondary | ICD-10-CM | POA: Insufficient documentation

## 2014-08-28 DIAGNOSIS — Y9389 Activity, other specified: Secondary | ICD-10-CM | POA: Insufficient documentation

## 2014-08-28 DIAGNOSIS — W01198A Fall on same level from slipping, tripping and stumbling with subsequent striking against other object, initial encounter: Secondary | ICD-10-CM | POA: Diagnosis not present

## 2014-08-28 DIAGNOSIS — F419 Anxiety disorder, unspecified: Secondary | ICD-10-CM | POA: Insufficient documentation

## 2014-08-28 DIAGNOSIS — J449 Chronic obstructive pulmonary disease, unspecified: Secondary | ICD-10-CM | POA: Diagnosis not present

## 2014-08-28 DIAGNOSIS — R109 Unspecified abdominal pain: Secondary | ICD-10-CM

## 2014-08-28 DIAGNOSIS — Z87448 Personal history of other diseases of urinary system: Secondary | ICD-10-CM | POA: Insufficient documentation

## 2014-08-28 DIAGNOSIS — Z79899 Other long term (current) drug therapy: Secondary | ICD-10-CM | POA: Diagnosis not present

## 2014-08-28 DIAGNOSIS — Z880 Allergy status to penicillin: Secondary | ICD-10-CM | POA: Diagnosis not present

## 2014-08-28 DIAGNOSIS — S3991XA Unspecified injury of abdomen, initial encounter: Secondary | ICD-10-CM | POA: Diagnosis not present

## 2014-08-28 DIAGNOSIS — I10 Essential (primary) hypertension: Secondary | ICD-10-CM | POA: Diagnosis not present

## 2014-08-28 DIAGNOSIS — W19XXXA Unspecified fall, initial encounter: Secondary | ICD-10-CM

## 2014-08-28 DIAGNOSIS — Z7951 Long term (current) use of inhaled steroids: Secondary | ICD-10-CM | POA: Diagnosis not present

## 2014-08-28 DIAGNOSIS — Z8546 Personal history of malignant neoplasm of prostate: Secondary | ICD-10-CM | POA: Diagnosis not present

## 2014-08-28 LAB — URINALYSIS, ROUTINE W REFLEX MICROSCOPIC
BILIRUBIN URINE: NEGATIVE
GLUCOSE, UA: NEGATIVE mg/dL
Hgb urine dipstick: NEGATIVE
KETONES UR: NEGATIVE mg/dL
Leukocytes, UA: NEGATIVE
Nitrite: NEGATIVE
Protein, ur: NEGATIVE mg/dL
Specific Gravity, Urine: 1.014 (ref 1.005–1.030)
Urobilinogen, UA: 0.2 mg/dL (ref 0.0–1.0)
pH: 5.5 (ref 5.0–8.0)

## 2014-08-28 MED ORDER — HYDROCODONE-ACETAMINOPHEN 5-325 MG PO TABS
2.0000 | ORAL_TABLET | Freq: Once | ORAL | Status: AC
  Administered 2014-08-28: 2 via ORAL
  Filled 2014-08-28: qty 2

## 2014-08-28 MED ORDER — HYDROCODONE-ACETAMINOPHEN 5-325 MG PO TABS: 1.0000 | ORAL_TABLET | ORAL | Status: DC | PRN

## 2014-08-28 MED ORDER — ONDANSETRON 4 MG PO TBDP
4.0000 mg | ORAL_TABLET | Freq: Once | ORAL | Status: AC
  Administered 2014-08-28: 4 mg via ORAL
  Filled 2014-08-28: qty 1

## 2014-08-28 NOTE — Discharge Instructions (Signed)
Your x-rays today showed no fracture. Your urine showed no sign of infection or blood to suggest kidney injury. He likely have a DP she bruise also known as a contusion. You may place ice on this area. You may alternate Tylenol and Motrin for pain.   Fall Prevention and Home Safety Falls cause injuries and can affect all age groups. It is possible to use preventive measures to significantly decrease the likelihood of falls. There are many simple measures which can make your home safer and prevent falls. OUTDOORS  Repair cracks and edges of walkways and driveways.  Remove high doorway thresholds.  Trim shrubbery on the main path into your home.  Have good outside lighting.  Clear walkways of tools, rocks, debris, and clutter.  Check that handrails are not broken and are securely fastened. Both sides of steps should have handrails.  Have leaves, snow, and ice cleared regularly.  Use sand or salt on walkways during winter months.  In the garage, clean up grease or oil spills. BATHROOM  Install night lights.  Install grab bars by the toilet and in the tub and shower.  Use non-skid mats or decals in the tub or shower.  Place a plastic non-slip stool in the shower to sit on, if needed.  Keep floors dry and clean up all water on the floor immediately.  Remove soap buildup in the tub or shower on a regular basis.  Secure bath mats with non-slip, double-sided rug tape.  Remove throw rugs and tripping hazards from the floors. BEDROOMS  Install night lights.  Make sure a bedside light is easy to reach.  Do not use oversized bedding.  Keep a telephone by your bedside.  Have a firm chair with side arms to use for getting dressed.  Remove throw rugs and tripping hazards from the floor. KITCHEN  Keep handles on pots and pans turned toward the center of the stove. Use back burners when possible.  Clean up spills quickly and allow time for drying.  Avoid walking on wet  floors.  Avoid hot utensils and knives.  Position shelves so they are not too high or low.  Place commonly used objects within easy reach.  If necessary, use a sturdy step stool with a grab bar when reaching.  Keep electrical cables out of the way.  Do not use floor polish or wax that makes floors slippery. If you must use wax, use non-skid floor wax.  Remove throw rugs and tripping hazards from the floor. STAIRWAYS  Never leave objects on stairs.  Place handrails on both sides of stairways and use them. Fix any loose handrails. Make sure handrails on both sides of the stairways are as long as the stairs.  Check carpeting to make sure it is firmly attached along stairs. Make repairs to worn or loose carpet promptly.  Avoid placing throw rugs at the top or bottom of stairways, or properly secure the rug with carpet tape to prevent slippage. Get rid of throw rugs, if possible.  Have an electrician put in a light switch at the top and bottom of the stairs. OTHER FALL PREVENTION TIPS  Wear low-heel or rubber-soled shoes that are supportive and fit well. Wear closed toe shoes.  When using a stepladder, make sure it is fully opened and both spreaders are firmly locked. Do not climb a closed stepladder.  Add color or contrast paint or tape to grab bars and handrails in your home. Place contrasting color strips on first and last  steps.  Learn and use mobility aids as needed. Install an electrical emergency response system.  Turn on lights to avoid dark areas. Replace light bulbs that burn out immediately. Get light switches that glow.  Arrange furniture to create clear pathways. Keep furniture in the same place.  Firmly attach carpet with non-skid or double-sided tape.  Eliminate uneven floor surfaces.  Select a carpet pattern that does not visually hide the edge of steps.  Be aware of all pets. OTHER HOME SAFETY TIPS  Set the water temperature for 120 F (48.8 C).  Keep  emergency numbers on or near the telephone.  Keep smoke detectors on every level of the home and near sleeping areas. Document Released: 09/21/2002 Document Revised: 04/01/2012 Document Reviewed: 12/21/2011 [facility] Patient Information 2015 [place], [place]. This information is not intended to replace advice given to you by your health care provider. Make sure you discuss any questions you have with your health care provider.   Posterior Chest Contusion A chest contusion is a deep bruise on your chest area. Contusions are the result of an injury that caused bleeding under the skin. A chest contusion may involve bruising of the skin, muscles, or ribs. The contusion may turn blue, purple, or yellow. Minor injuries will give you a painless contusion, but more severe contusions may stay painful and swollen for a few weeks. CAUSES  A contusion is usually caused by a blow, trauma, or direct force to an area of the body. SYMPTOMS   Swelling and redness of the injured area.  Discoloration of the injured area.  Tenderness and soreness of the injured area.  Pain. DIAGNOSIS  The diagnosis can be made by taking a history and performing a physical exam. An X-ray, CT scan, or MRI may be needed to determine if there were any associated injuries, such as broken bones (fractures) or internal injuries. TREATMENT  Often, the best treatment for a chest contusion is resting, icing, and applying cold compresses to the injured area. Deep breathing exercises may be recommended to reduce the risk of pneumonia. Over-the-counter medicines may also be recommended for pain control. HOME CARE INSTRUCTIONS   Put ice on the injured area.  Put ice in a plastic bag.  Place a towel between your skin and the bag.  Leave the ice on for 15-20 minutes, 03-04 times a day.  Only take over-the-counter or prescription medicines as directed by your caregiver. Your caregiver may recommend avoiding anti-inflammatory medicines  (aspirin, ibuprofen, and naproxen) for 48 hours because these medicines may increase bruising.  Rest the injured area.  Perform deep-breathing exercises as directed by your caregiver.  Stop smoking if you smoke.  Do not lift objects over 5 pounds (2.3 kg) for 3 days or longer if recommended by your caregiver. SEEK IMMEDIATE MEDICAL CARE IF:   You have increased bruising or swelling.  You have pain that is getting worse.  You have difficulty breathing.  You have dizziness, weakness, or fainting.  You have blood in your urine or stool.  You cough up or vomit blood.  Your swelling or pain is not relieved with medicines. MAKE SURE YOU:   Understand these instructions.  Will watch your condition.  Will get help right away if you are not doing well or get worse. Document Released: 06/26/2001 Document Revised: 06/25/2012 Document Reviewed: 03/24/2012 [facility] Patient Information 2015 [place], [place]. This information is not intended to replace advice given to you by your health care provider. Make sure you discuss any questions  you have with your health care provider.

## 2014-08-28 NOTE — ED Notes (Signed)
Patient states he fell Tuesday and hit a coffee table on his back,  lower right side. States that he can't perform his normal activities due to the pain.

## 2014-08-28 NOTE — ED Provider Notes (Signed)
TIME SEEN: 11:40 AM  CHIEF COMPLAINT: fall, back pain  HPI: Pt is a 63 y.o. M history of hypertension, COPD, prostate cancer who presents the emergency department with complaints of right-sided back pain after he fell 5 days ago and hit his back on a coffee table. He states that he fell because he was slightly lightheaded but has not had this symptom since. No chest pain or shortness of breath. No vomiting or diarrhea. He is not currently receiving chemotherapy or radiation but is supposed to have a biopsy with his urologist.  He denies hitting his head during this fall. No numbness, tingling or focal weakness.  ROS: See HPI Constitutional: no fever  Eyes: no drainage  ENT: no runny nose   Cardiovascular:  no chest pain  Resp: no SOB  GI: no vomiting GU: no dysuria Integumentary: no rash  Allergy: no hives  Musculoskeletal: no leg swelling  Neurological: no slurred speech ROS otherwise negative  PAST MEDICAL HISTORY/PAST SURGICAL HISTORY:  Past Medical History  Diagnosis Date  . Hypertension   . Prostate cancer   . GERD (gastroesophageal reflux disease)   . History of TB (tuberculosis)     1984--  hospitalized for 4 month treatment  . Post-polio syndrome     polio at age 40  . History of chronic bronchitis   . History of rheumatic fever   . COPD, frequent exacerbations     pulmologist-  dr Joya Gaskins--  Gold Stage C  . Bladder outlet obstruction   . Anxiety disorder   . History of urinary retention   . Shortness of breath dyspnea   . Nocturnal oxygen desaturation     USES O2 NIGHTLY  . Prostate cancer   . Complication of anesthesia     DIFFICULT WAKING     MEDICATIONS:  Prior to Admission medications   Medication Sig Start Date End Date Taking? Authorizing Provider  albuterol (PROVENTIL HFA;VENTOLIN HFA) 108 (90 BASE) MCG/ACT inhaler Inhale 1-2 puffs into the lungs every 6 (six) hours as needed for wheezing or shortness of breath. PROAIR 06/04/14   Brunetta Jeans, PA-C   albuterol (PROVENTIL) (2.5 MG/3ML) 0.083% nebulizer solution Take 2.5 mg by nebulization every 6 (six) hours as needed for wheezing or shortness of breath. Pt uses 2-3X daily    Historical Provider, MD  ALPRAZolam (XANAX) 0.5 MG tablet Take 0.5 mg by mouth at bedtime as needed for anxiety.    Historical Provider, MD  ALPRAZolam [name]) 1 MG tablet Take 1 tablet (1 mg total) by mouth 3 (three) times daily as needed for anxiety. 06/03/14   Brunetta Jeans, PA-C  amLODipine (NORVASC) 10 MG tablet Take 10 mg by mouth daily.    Historical Provider, MD  doxycycline (VIBRA-TABS) 100 MG tablet Take 1 tablet (100 mg total) by mouth every 12 (twelve) hours. For 3days 08/20/14   Domenic Polite, MD  famotidine (PEPCID) 20 MG tablet Take 20 mg by mouth 2 (two) times daily.    Historical Provider, MD  fluticasone (FLONASE) 50 MCG/ACT nasal spray Place 2 sprays into both nostrils daily. 07/08/14   Elsie Stain, MD  Fluticasone Furoate-Vilanterol 100-25 MCG/INH AEPB Inhale 1 puff into the lungs daily.    Historical Provider, MD  loratadine (CLARITIN) 10 MG tablet Take 10 mg by mouth daily as needed for allergies.    Historical Provider, MD  losartan (COZAAR) 50 MG tablet Take 50 mg by mouth daily.    Historical Provider, MD  montelukast (SINGULAIR) 10  MG tablet Take 10 mg by mouth at bedtime.    Historical Provider, MD  omeprazole (PRILOSEC) 20 MG capsule Take 1 capsule (20 mg total) by mouth daily. 07/08/14   Elsie Stain, MD  oxyCODONE-acetaminophen (PERCOCET) 10-325 MG per tablet Take 1 tablet by mouth every 8 (eight) hours as needed for pain. 07/02/14   Brunetta Jeans, PA-C  predniSONE (DELTASONE) 20 MG tablet Take 2.5 tablets (50 mg total) by mouth daily with breakfast. Take 40mg  for 2days then 20mg  for 2days then STOP 08/20/14   Domenic Polite, MD  promethazine (PHENERGAN) 12.5 MG tablet Take 1 tablet (12.5 mg total) by mouth every 8 (eight) hours as needed for nausea or vomiting. 07/13/14   Brunetta Jeans, PA-C  tamsulosin (FLOMAX) 0.4 MG CAPS capsule Take 0.4 mg by mouth 2 (two) times daily.  05/31/14   Historical Provider, MD  tiotropium (SPIRIVA) 18 MCG inhalation capsule Place 18 mcg into inhaler and inhale daily.    Historical Provider, MD    ALLERGIES:  Allergies  Allergen Reactions  . Ivp Dye [Iodinated Diagnostic Agents] Anaphylaxis  . Levaquin [Levofloxacin In D5w] Shortness Of Breath    In addition: sweating, chest pain, and diarrhea.   . Penicillins Anaphylaxis    Heart stops  . Aspirin Other (See Comments)    Reaction unknown  . Morphine And Related Nausea And Vomiting  . Nsaids Other (See Comments)    Difficulty breathing    SOCIAL HISTORY:  History  Substance Use Topics  . Smoking status: Former Smoker -- 75 years    Quit date: 08/25/2013  . Smokeless tobacco: Never Used     Comment: hx  smoke a pack of pipe tobacco(1.5oz) every 10 days.   . Alcohol Use: 12.6 oz/week    21 Cans of beer per week     Comment: average 3 beers per day    FAMILY HISTORY: Family History  Problem Relation Age of Onset  . Alzheimer's disease Father 4    Deceased  . Stomach cancer Father   . Heart attack Father   . Heart disease Father   . Skin cancer Mother     Facial-Living  . Alcohol abuse Sister     x2  . Mental illness Sister     x2  . Diabetes Maternal Aunt     x2  . Thyroid disease Maternal Aunt     x4  . Diabetes Maternal Uncle   . Tuberculosis Paternal Grandfather   . Tuberculosis Paternal Grandmother   . Alzheimer's disease Paternal Aunt   . Alzheimer's disease Paternal Uncle     EXAM: BP 156/67 mmHg  Pulse 86  Temp(Src) 97.9 F (36.6 C) (Oral)  Resp 20  Ht 6\' 1"  (1.854 m)  Wt 215 lb (97.523 kg)  BMI 28.37 kg/m2  SpO2 98% CONSTITUTIONAL: Alert and oriented and responds appropriately to questions. Well-appearing; well-nourished; GCS 15 HEAD: Normocephalic; atraumatic EYES: Conjunctivae clear, PERRL, EOMI ENT: normal nose; no rhinorrhea; moist  mucous membranes; pharynx without lesions noted; no dental injury;  no septal hematoma NECK: Supple, no meningismus, no LAD; no midline spinal tenderness, step-off or deformity CARD: RRR; S1 and S2 appreciated; no murmurs, no clicks, no rubs, no gallops RESP: Normal chest excursion without splinting or tachypnea; breath sounds clear and equal bilaterally; no wheezes, no rhonchi, no rales; chest wall stable, nontender to palpation ABD/GI: Normal bowel sounds; non-distended; soft, non-tender, no rebound, no guarding PELVIS:  stable, nontender to palpation BACK:  The  back appears normal and is tender to palpation over the thoracic spine and right posterior ribs without deformity; there is no CVA tenderness; no  step-off or deformity EXT: Normal ROM in all joints; non-tender to palpation; no edema; normal capillary refill; no cyanosis    SKIN: Normal color for age and race; warm NEURO: Moves all extremities equally; normal gait, sensation to light touch intact diffusely, cranial nerves II through XII intact PSYCH: The patient's mood and manner are appropriate. Grooming and personal hygiene are appropriate.  MEDICAL DECISION MAKING: Pt here with a fall 5 days ago with right flank pain. We'll obtain x-rays of his thoracic spine, right posterior ribs and obtain a urinalysis. No other sign of trauma on exam. Neurologically intact. Hemodynamically stable. We'll give hydrocodone for pain.  ED PROGRESS: Urine shows no sign of infection, hematuria. X-rays are negative for acute injury. Likely a contusion. We'll discharge home with prescription for Vicodin and PCP follow-up information.  Discussed return precautions and supportive care instructions. Patient and wife verbalize understanding and are comfortable with plan.     [place], DO 08/28/14 1316

## 2014-08-28 NOTE — ED Notes (Signed)
MD at bedside. 

## 2014-08-30 ENCOUNTER — Telehealth: Payer: Self-pay | Admitting: Physician Assistant

## 2014-08-30 NOTE — Telephone Encounter (Signed)
Please call patient to schedule ER follow-up and surgical clearance -- 45 minutes please     ----- Message -----     From: SYSTEM     Sent: 08/28/2014  1:38 PM      To: Brunetta Jeans, PA-C     Left message for patient to return my call and to schedule this appointment. 45 min ED follow up per [name], PA-C

## 2014-09-01 ENCOUNTER — Ambulatory Visit (INDEPENDENT_AMBULATORY_CARE_PROVIDER_SITE_OTHER): Payer: Medicare HMO | Admitting: Physician Assistant

## 2014-09-01 VITALS — BP 150/77 | HR 99 | Temp 98.0°F | Wt 218.0 lb

## 2014-09-01 DIAGNOSIS — C61 Malignant neoplasm of prostate: Secondary | ICD-10-CM

## 2014-09-01 DIAGNOSIS — R079 Chest pain, unspecified: Secondary | ICD-10-CM

## 2014-09-01 DIAGNOSIS — R413 Other amnesia: Secondary | ICD-10-CM

## 2014-09-01 DIAGNOSIS — I10 Essential (primary) hypertension: Secondary | ICD-10-CM

## 2014-09-01 MED ORDER — METOPROLOL SUCCINATE ER 25 MG PO TB24: 25.0000 mg | ORAL_TABLET | Freq: Every day | ORAL | Status: DC

## 2014-09-01 NOTE — Progress Notes (Signed)
Patient presents to clinic today for hospital follow-up for atypical chest pain and COPD exacerbation. Patient was seen in the ER and subsequently admitted to the hospital on 08/18/14 for complaints of chest pain and shortness of breath.  Patient also with potential severe reaction to Levaquin as he had taken the medication the morning symptoms had started for an upcoming cystoscopy and prostate biopsy with Urology.    Patient presents to clinic today for ER follow-up after sustaining a fall and subsequent injury to back.  Fall occurred on 08/25/14 when patient fell from roof while cleaning his gutters.  Was not seen in ER until 08/28/14.  ER workup including imaging of ribs and spine was unremarkable for acute pathology.  Patient denies lightheadedness and dizziness prior to fall. Denies head trauma or loss of consciousness. Fall witnessed. Endorses some residual side pain. Denies bruising. Denies pleuritic chest pain. Has been doing well.  Patient does wish to speak about some issues with memory that he has noticed over the past couple of months.  Endorses hard time with recent history. Denies issue with remote history.  Is having some difficulty with concentration. Patient without known family hx of dementia.  Endorses hx of low testosterone, not currently treated due to active prostatic carcinoma.  Past Medical History  Diagnosis Date  . Hypertension   . Prostate cancer   . GERD (gastroesophageal reflux disease)   . History of TB (tuberculosis)     1984--  hospitalized for 4 month treatment  . Post-polio syndrome     polio at age 59  . History of chronic bronchitis   . History of rheumatic fever   . COPD, frequent exacerbations     pulmologist-  dr Joya Gaskins--  Gold Stage C  . Bladder outlet obstruction   . Anxiety disorder   . History of urinary retention   . Shortness of breath dyspnea   . Nocturnal oxygen desaturation     USES O2 NIGHTLY  . Prostate cancer   . Complication of  anesthesia     DIFFICULT WAKING     Current Outpatient Prescriptions on File Prior to Visit  Medication Sig Dispense Refill  . albuterol (PROVENTIL HFA;VENTOLIN HFA) 108 (90 BASE) MCG/ACT inhaler Inhale 1-2 puffs into the lungs every 6 (six) hours as needed for wheezing or shortness of breath. PROAIR 3 Inhaler 1  . albuterol (PROVENTIL) (2.5 MG/3ML) 0.083% nebulizer solution Take 2.5 mg by nebulization every 6 (six) hours as needed for wheezing or shortness of breath. Pt uses 2-3X daily    . ALPRAZolam (XANAX) 1 MG tablet Take 1 tablet (1 mg total) by mouth 3 (three) times daily as needed for anxiety. 90 tablet 0  . amLODipine (NORVASC) 10 MG tablet Take 10 mg by mouth daily.    . famotidine (PEPCID) 20 MG tablet Take 20 mg by mouth 2 (two) times daily.    . fluticasone (FLONASE) 50 MCG/ACT nasal spray Place 2 sprays into both nostrils daily. 16 g 12  . Fluticasone Furoate-Vilanterol 100-25 MCG/INH AEPB Inhale 1 puff into the lungs daily.    Marland Kitchen HYDROcodone-acetaminophen (NORCO/VICODIN) 5-325 MG per tablet Take 1 tablet by mouth every 4 (four) hours as needed. 15 tablet 0  . loratadine (CLARITIN) 10 MG tablet Take 10 mg by mouth daily as needed for allergies.    . montelukast (SINGULAIR) 10 MG tablet Take 10 mg by mouth at bedtime.    Marland Kitchen omeprazole (PRILOSEC) 20 MG capsule Take 1 capsule (20 mg total)  by mouth daily. 30 capsule 4  . oxyCODONE-acetaminophen (PERCOCET) 10-325 MG per tablet Take 1 tablet by mouth every 8 (eight) hours as needed for pain. 120 tablet 0  . predniSONE (DELTASONE) 20 MG tablet Take 2.5 tablets (50 mg total) by mouth daily with breakfast. Take $RemoveBeforeD'40mg'AVnmLhVOqoOaNS$  for 2days then $RemoveBe'20mg'DRLQAcTlK$  for 2days then STOP 6 tablet 0  . promethazine (PHENERGAN) 12.5 MG tablet Take 1 tablet (12.5 mg total) by mouth every 8 (eight) hours as needed for nausea or vomiting. 30 tablet 0  . tamsulosin (FLOMAX) 0.4 MG CAPS capsule Take 0.4 mg by mouth 2 (two) times daily.     Marland Kitchen tiotropium (SPIRIVA) 18 MCG inhalation  capsule Place 18 mcg into inhaler and inhale daily.    Marland Kitchen ALPRAZolam (XANAX) 0.5 MG tablet Take 0.5 mg by mouth at bedtime as needed for anxiety.     No current facility-administered medications on file prior to visit.    Allergies  Allergen Reactions  . Ivp Dye [Iodinated Diagnostic Agents] Anaphylaxis  . Levaquin [Levofloxacin In D5w] Shortness Of Breath    In addition: sweating, chest pain, and diarrhea.   . Penicillins Anaphylaxis    Heart stops  . Aspirin Other (See Comments)    Reaction unknown  . Morphine And Related Nausea And Vomiting  . Nsaids Other (See Comments)    Difficulty breathing    Family History  Problem Relation Age of Onset  . Alzheimer's disease Father 4    Deceased  . Stomach cancer Father   . Heart attack Father   . Heart disease Father   . Skin cancer Mother     Facial-Living  . Alcohol abuse Sister     x2  . Mental illness Sister     x2  . Diabetes Maternal Aunt     x2  . Thyroid disease Maternal Aunt     x4  . Diabetes Maternal Uncle   . Tuberculosis Paternal Grandfather   . Tuberculosis Paternal Grandmother   . Alzheimer's disease Paternal Aunt   . Alzheimer's disease Paternal Uncle     History   Social History  . Marital Status: Married    Spouse Name: N/A    Number of Children: N/A  . Years of Education: N/A   Social History Main Topics  . Smoking status: Former Smoker -- 35 years    Quit date: 08/25/2013  . Smokeless tobacco: Never Used     Comment: hx  smoke a pack of pipe tobacco(1.5oz) every 10 days.   . Alcohol Use: 12.6 oz/week    21 Cans of beer per week     Comment: average 3 beers per day  . Drug Use: No  . Sexual Activity: Not on file   Other Topics Concern  . Not on file   Social History Narrative   Review of Systems - See HPI.  All other ROS are negative.  BP 150/77 mmHg  Pulse 99  Temp(Src) 98 F (36.7 C)  Wt 218 lb (98.884 kg)  SpO2 96%  Physical Exam  Constitutional: He is oriented to person,  place, and time and well-developed, well-nourished, and in no distress.  HENT:  Head: Normocephalic and atraumatic.  Right Ear: External ear normal.  Left Ear: External ear normal.  Nose: Nose normal.  Mouth/Throat: Oropharynx is clear and moist. No oropharyngeal exudate.  TM within normal limits bilaterally.  Eyes: Conjunctivae and EOM are normal. Pupils are equal, round, and reactive to light.  Neck: Normal range of motion.  Neck supple.  Cardiovascular: Normal rate, regular rhythm, normal heart sounds and intact distal pulses.   Pulmonary/Chest: Effort normal and breath sounds normal. No respiratory distress. He has no wheezes. He has no rales. He exhibits no tenderness.  Abdominal: Soft. Bowel sounds are normal. He exhibits no distension and no mass. There is no tenderness. There is no rebound and no guarding.  Neurological: He is alert and oriented to person, place, and time.  Skin: Skin is warm and dry. No rash noted.  Psychiatric: Affect normal.  Vitals reviewed.  Recent Results (from the past 2160 hour(s))  Urinalysis, Routine w reflex microscopic     Status: Abnormal   Collection Time: 06/29/14  1:18 PM  Result Value Ref Range   Color, Urine YELLOW Yellow;Lt. Yellow   APPearance CLEAR Clear   Specific Gravity, Urine 1.015 1.000-1.030   pH 5.5 5.0 - 8.0   Total Protein, Urine NEGATIVE Negative   Urine Glucose NEGATIVE Negative   Ketones, ur NEGATIVE Negative   Bilirubin Urine NEGATIVE Negative   Hgb urine dipstick NEGATIVE Negative   Urobilinogen, UA 0.2 0.0 - 1.0   Leukocytes, UA NEGATIVE Negative   Nitrite NEGATIVE Negative   WBC, UA 3-6/hpf (A) 0-2/hpf   RBC / HPF none seen 0-2/hpf   Squamous Epithelial / LPF Rare(0-4/hpf) Rare(0-4/hpf)  Urine culture     Status: None   Collection Time: 06/29/14  1:45 PM  Result Value Ref Range   Colony Count NO GROWTH    Organism ID, Bacteria NO GROWTH   CBC WITH DIFFERENTIAL     Status: Abnormal   Collection Time: 07/01/14  9:25  PM  Result Value Ref Range   WBC 7.3 4.0 - 10.5 K/uL   RBC 4.27 4.22 - 5.81 MIL/uL   Hemoglobin 13.7 13.0 - 17.0 g/dL   HCT 39.5 39.0 - 52.0 %   MCV 92.5 78.0 - 100.0 fL   MCH 32.1 26.0 - 34.0 pg   MCHC 34.7 30.0 - 36.0 g/dL   RDW 13.0 11.5 - 15.5 %   Platelets 213 150 - 400 K/uL   Neutrophils Relative % 63 43 - 77 %   Neutro Abs 4.6 1.7 - 7.7 K/uL   Lymphocytes Relative 22 12 - 46 %   Lymphs Abs 1.6 0.7 - 4.0 K/uL   Monocytes Relative 8 3 - 12 %   Monocytes Absolute 0.6 0.1 - 1.0 K/uL   Eosinophils Relative 7 (H) 0 - 5 %   Eosinophils Absolute 0.5 0.0 - 0.7 K/uL   Basophils Relative 0 0 - 1 %   Basophils Absolute 0.0 0.0 - 0.1 K/uL  Comprehensive metabolic panel     Status: Abnormal   Collection Time: 07/01/14  9:25 PM  Result Value Ref Range   Sodium 137 137 - 147 mEq/L   Potassium 4.5 3.7 - 5.3 mEq/L   Chloride 97 96 - 112 mEq/L   CO2 27 19 - 32 mEq/L   Glucose, Bld 122 (H) 70 - 99 mg/dL   BUN 15 6 - 23 mg/dL   Creatinine, Ser 1.40 (H) 0.50 - 1.35 mg/dL   Calcium 9.4 8.4 - 10.5 mg/dL   Total Protein 7.4 6.0 - 8.3 g/dL   Albumin 3.9 3.5 - 5.2 g/dL   AST 20 0 - 37 U/L   ALT 25 0 - 53 U/L   Alkaline Phosphatase 63 39 - 117 U/L   Total Bilirubin 0.6 0.3 - 1.2 mg/dL   GFR calc non Af Wyvonnia Lora  52 (L) >90 mL/min   GFR calc Af Amer 60 (L) >90 mL/min    Comment: (NOTE) The eGFR has been calculated using the CKD EPI equation. This calculation has not been validated in all clinical situations. eGFR's persistently <90 mL/min signify possible Chronic Kidney Disease.   Anion gap 13 5 - 15  Urinalysis with microscopic     Status: None   Collection Time: 07/01/14  9:37 PM  Result Value Ref Range   Color, Urine YELLOW YELLOW   APPearance CLEAR CLEAR   Specific Gravity, Urine 1.006 1.005 - 1.030   pH 7.0 5.0 - 8.0   Glucose, UA NEGATIVE NEGATIVE mg/dL   Hgb urine dipstick NEGATIVE NEGATIVE   Bilirubin Urine NEGATIVE NEGATIVE   Ketones, ur NEGATIVE NEGATIVE mg/dL   Protein, ur  NEGATIVE NEGATIVE mg/dL   Urobilinogen, UA 0.2 0.0 - 1.0 mg/dL   Nitrite NEGATIVE NEGATIVE   Leukocytes, UA NEGATIVE NEGATIVE    Comment: MICROSCOPIC NOT DONE ON URINES WITH NEGATIVE PROTEIN, BLOOD, LEUKOCYTES, NITRITE, OR GLUCOSE <1000 mg/dL.  Urinalysis, Routine w reflex microscopic     Status: Abnormal   Collection Time: 08/05/14  4:27 PM  Result Value Ref Range   Color, Urine YELLOW YELLOW   APPearance CLEAR CLEAR   Specific Gravity, Urine 1.005 1.005 - 1.030   pH 6.0 5.0 - 8.0   Glucose, UA NEGATIVE NEGATIVE mg/dL   Hgb urine dipstick NEGATIVE NEGATIVE   Bilirubin Urine NEGATIVE NEGATIVE   Ketones, ur NEGATIVE NEGATIVE mg/dL   Protein, ur NEGATIVE NEGATIVE mg/dL   Urobilinogen, UA 0.2 0.0 - 1.0 mg/dL   Nitrite NEGATIVE NEGATIVE   Leukocytes, UA TRACE (A) NEGATIVE  Urine microscopic-add on     Status: None   Collection Time: 08/05/14  4:27 PM  Result Value Ref Range   Squamous Epithelial / LPF RARE RARE   WBC, UA 0-2 <3 WBC/hpf   Bacteria, UA RARE RARE  CBC with Differential     Status: Abnormal   Collection Time: 08/05/14  5:34 PM  Result Value Ref Range   WBC 5.8 4.0 - 10.5 K/uL   RBC 4.20 (L) 4.22 - 5.81 MIL/uL   Hemoglobin 13.1 13.0 - 17.0 g/dL   HCT 39.2 39.0 - 52.0 %   MCV 93.3 78.0 - 100.0 fL   MCH 31.2 26.0 - 34.0 pg   MCHC 33.4 30.0 - 36.0 g/dL   RDW 13.4 11.5 - 15.5 %   Platelets 227 150 - 400 K/uL   Neutrophils Relative % 71 43 - 77 %   Neutro Abs 4.2 1.7 - 7.7 K/uL   Lymphocytes Relative 19 12 - 46 %   Lymphs Abs 1.1 0.7 - 4.0 K/uL   Monocytes Relative 7 3 - 12 %   Monocytes Absolute 0.4 0.1 - 1.0 K/uL   Eosinophils Relative 3 0 - 5 %   Eosinophils Absolute 0.2 0.0 - 0.7 K/uL   Basophils Relative 0 0 - 1 %   Basophils Absolute 0.0 0.0 - 0.1 K/uL  Comprehensive metabolic panel     Status: Abnormal   Collection Time: 08/05/14  5:34 PM  Result Value Ref Range   Sodium 137 137 - 147 mEq/L   Potassium 4.0 3.7 - 5.3 mEq/L   Chloride 96 96 - 112 mEq/L    CO2 25 19 - 32 mEq/L   Glucose, Bld 100 (H) 70 - 99 mg/dL   BUN 16 6 - 23 mg/dL   Creatinine, Ser 1.10 0.50 - 1.35  mg/dL   Calcium 9.7 8.4 - 10.5 mg/dL   Total Protein 7.5 6.0 - 8.3 g/dL   Albumin 4.1 3.5 - 5.2 g/dL   AST 22 0 - 37 U/L   ALT 24 0 - 53 U/L   Alkaline Phosphatase 68 39 - 117 U/L   Total Bilirubin 0.4 0.3 - 1.2 mg/dL   GFR calc non Af Amer 69 (L) >90 mL/min   GFR calc Af Amer 80 (L) >90 mL/min    Comment: (NOTE) The eGFR has been calculated using the CKD EPI equation. This calculation has not been validated in all clinical situations. eGFR's persistently <90 mL/min signify possible Chronic Kidney Disease.   Anion gap 16 (H) 5 - 15  Lipase, blood     Status: None   Collection Time: 08/05/14  5:39 PM  Result Value Ref Range   Lipase 35 11 - 59 U/L  Basic Metabolic Panel (BMET)     Status: None   Collection Time: 08/13/14  3:56 PM  Result Value Ref Range   Sodium 135 135 - 145 mEq/L   Potassium 4.7 3.5 - 5.3 mEq/L   Chloride 104 96 - 112 mEq/L   CO2 21 19 - 32 mEq/L   Glucose, Bld 74 70 - 99 mg/dL   BUN 13 6 - 23 mg/dL   Creat 1.18 0.50 - 1.35 mg/dL   Calcium 9.4 8.4 - 10.5 mg/dL  Hemoglobin A1c     Status: Abnormal   Collection Time: 08/13/14  3:56 PM  Result Value Ref Range   Hgb A1c MFr Bld 6.0 (H) <5.7 %    Comment:                                                                        According to the ADA Clinical Practice Recommendations for 2011, when HbA1c is used as a screening test:     >=6.5%   Diagnostic of Diabetes Mellitus            (if abnormal result is confirmed)   5.7-6.4%   Increased risk of developing Diabetes Mellitus   References:Diagnosis and Classification of Diabetes Mellitus,Diabetes XBJY,7829,56(OZHYQ 1):S62-S69 and Standards of Medical Care in         Diabetes - 2011,Diabetes Care,2011,34 (Suppl 1):S11-S61.     Mean Plasma Glucose 126 (H) <117 mg/dL  Osmolality     Status: None   Collection Time: 08/13/14  3:56 PM  Result  Value Ref Range   Osmolality 289 275 - 300 mOsm/kg  Osmolality, urine     Status: Abnormal   Collection Time: 08/13/14  3:56 PM  Result Value Ref Range   Osmolality, Ur 211 (L) 390 - 1090 mOsm/kg  Urinalysis, Routine w reflex microscopic     Status: None   Collection Time: 08/13/14  3:56 PM  Result Value Ref Range   Color, Urine YELLOW YELLOW   APPearance CLEAR CLEAR   Specific Gravity, Urine 1.009 1.005 - 1.030   pH 5.0 5.0 - 8.0   Glucose, UA NEG NEG mg/dL   Bilirubin Urine NEG NEG   Ketones, ur NEG NEG mg/dL   Hgb urine dipstick NEG NEG   Protein, ur NEG NEG mg/dL   Urobilinogen, UA 0.2 0.0 -  1.0 mg/dL   Nitrite NEG NEG   Leukocytes, UA NEG NEG  CBC     Status: Abnormal   Collection Time: 08/18/14 11:55 AM  Result Value Ref Range   WBC 6.7 4.0 - 10.5 K/uL   RBC 4.21 (L) 4.22 - 5.81 MIL/uL   Hemoglobin 13.4 13.0 - 17.0 g/dL   HCT 94.4 (L) 96.7 - 59.1 %   MCV 91.0 78.0 - 100.0 fL   MCH 31.8 26.0 - 34.0 pg   MCHC 35.0 30.0 - 36.0 g/dL   RDW 63.8 46.6 - 59.9 %   Platelets 259 150 - 400 K/uL  Comprehensive metabolic panel     Status: Abnormal   Collection Time: 08/18/14 11:55 AM  Result Value Ref Range   Sodium 137 137 - 147 mEq/L   Potassium 4.2 3.7 - 5.3 mEq/L   Chloride 101 96 - 112 mEq/L   CO2 22 19 - 32 mEq/L   Glucose, Bld 102 (H) 70 - 99 mg/dL   BUN 15 6 - 23 mg/dL   Creatinine, Ser 3.57 0.50 - 1.35 mg/dL   Calcium 9.5 8.4 - 01.7 mg/dL   Total Protein 7.4 6.0 - 8.3 g/dL   Albumin 3.9 3.5 - 5.2 g/dL   AST 21 0 - 37 U/L   ALT 26 0 - 53 U/L   Alkaline Phosphatase 76 39 - 117 U/L   Total Bilirubin 0.3 0.3 - 1.2 mg/dL   GFR calc non Af Amer 69 (L) >90 mL/min   GFR calc Af Amer 80 (L) >90 mL/min    Comment: (NOTE) The eGFR has been calculated using the CKD EPI equation. This calculation has not been validated in all clinical situations. eGFR's persistently <90 mL/min signify possible Chronic Kidney Disease.    Anion gap 14 5 - 15  Troponin I (order at [facility])      Status: None   Collection Time: 08/18/14 11:55 AM  Result Value Ref Range   Troponin I <0.30 <0.30 ng/mL    Comment:        Due to the release kinetics of cTnI, a negative result within the first hours of the onset of symptoms does not rule out myocardial infarction with certainty. If myocardial infarction is still suspected, repeat the test at appropriate intervals.   Troponin I-serum (0, 3, 6 hours)     Status: None   Collection Time: 08/18/14  6:17 PM  Result Value Ref Range   Troponin I <0.30 <0.30 ng/mL    Comment:        Due to the release kinetics of cTnI, a negative result within the first hours of the onset of symptoms does not rule out myocardial infarction with certainty. If myocardial infarction is still suspected, repeat the test at appropriate intervals.   CBC     Status: Abnormal   Collection Time: 08/18/14  6:17 PM  Result Value Ref Range   WBC 5.9 4.0 - 10.5 K/uL   RBC 4.12 (L) 4.22 - 5.81 MIL/uL   Hemoglobin 12.7 (L) 13.0 - 17.0 g/dL   HCT 79.3 (L) 90.3 - 00.9 %   MCV 90.0 78.0 - 100.0 fL   MCH 30.8 26.0 - 34.0 pg   MCHC 34.2 30.0 - 36.0 g/dL   RDW 23.3 00.7 - 62.2 %   Platelets 267 150 - 400 K/uL  Creatinine, serum     Status: Abnormal   Collection Time: 08/18/14  6:17 PM  Result Value Ref Range   Creatinine, Ser  1.04 0.50 - 1.35 mg/dL   GFR calc non Af Amer 74 (L) >90 mL/min   GFR calc Af Amer 86 (L) >90 mL/min    Comment: (NOTE) The eGFR has been calculated using the CKD EPI equation. This calculation has not been validated in all clinical situations. eGFR's persistently <90 mL/min signify possible Chronic Kidney Disease.   D-dimer, quantitative     Status: None   Collection Time: 08/18/14  6:17 PM  Result Value Ref Range   D-Dimer, Quant 0.36 0.00 - 0.48 ug/mL-FEU    Comment:        AT THE INHOUSE ESTABLISHED CUTOFF VALUE OF 0.48 ug/mL FEU, THIS ASSAY HAS BEEN DOCUMENTED IN THE LITERATURE TO HAVE A SENSITIVITY AND NEGATIVE PREDICTIVE  VALUE OF AT LEAST 98 TO 99%.  THE TEST RESULT SHOULD BE CORRELATED WITH AN ASSESSMENT OF THE CLINICAL PROBABILITY OF DVT / VTE.   Troponin I-serum (0, 3, 6 hours)     Status: None   Collection Time: 08/18/14  8:25 PM  Result Value Ref Range   Troponin I <0.30 <0.30 ng/mL    Comment:        Due to the release kinetics of cTnI, a negative result within the first hours of the onset of symptoms does not rule out myocardial infarction with certainty. If myocardial infarction is still suspected, repeat the test at appropriate intervals.   Troponin I-serum (0, 3, 6 hours)     Status: None   Collection Time: 08/18/14 11:45 PM  Result Value Ref Range   Troponin I <0.30 <0.30 ng/mL    Comment:        Due to the release kinetics of cTnI, a negative result within the first hours of the onset of symptoms does not rule out myocardial infarction with certainty. If myocardial infarction is still suspected, repeat the test at appropriate intervals.   Urinalysis, Routine w reflex microscopic     Status: None   Collection Time: 08/28/14 12:53 PM  Result Value Ref Range   Color, Urine YELLOW YELLOW   APPearance CLEAR CLEAR   Specific Gravity, Urine 1.014 1.005 - 1.030   pH 5.5 5.0 - 8.0   Glucose, UA NEGATIVE NEGATIVE mg/dL   Hgb urine dipstick NEGATIVE NEGATIVE   Bilirubin Urine NEGATIVE NEGATIVE   Ketones, ur NEGATIVE NEGATIVE mg/dL   Protein, ur NEGATIVE NEGATIVE mg/dL   Urobilinogen, UA 0.2 0.0 - 1.0 mg/dL   Nitrite NEGATIVE NEGATIVE   Leukocytes, UA NEGATIVE NEGATIVE    Comment: MICROSCOPIC NOT DONE ON URINES WITH NEGATIVE PROTEIN, BLOOD, LEUKOCYTES, NITRITE, OR GLUCOSE <1000 mg/dL.  CBC     Status: Abnormal   Collection Time: 09/01/14  3:08 PM  Result Value Ref Range   WBC 6.5 4.0 - 10.5 K/uL   RBC 3.95 (L) 4.22 - 5.81 Mil/uL   Platelets 230.0 150.0 - 400.0 K/uL   Hemoglobin 12.4 (L) 13.0 - 17.0 g/dL   HCT 38.1 (L) 39.0 - 52.0 %   MCV 96.5 78.0 - 100.0 fl   MCHC 32.7 30.0 -  36.0 g/dL   RDW 15.0 11.5 - 15.5 %  TSH     Status: None   Collection Time: 09/01/14  3:08 PM  Result Value Ref Range   TSH 1.01 0.35 - 4.50 uIU/mL  B12     Status: None   Collection Time: 09/01/14  3:08 PM  Result Value Ref Range   Vitamin B-12 348 211 - 911 pg/mL  B. Burgdorfi Antibodies  Status: None   Collection Time: 09/01/14  3:08 PM  Result Value Ref Range   B burgdorferi Ab IgG+IgM 0.10 ISR    Comment: Antibody to Borrelia burgdorferi not detected.      ISR = Immune Status Ratio              <0.90         ISR       Negative              0.90 - 1.09   ISR       Equivocal              >=1.10        ISR       Positive   T4, free     Status: None   Collection Time: 09/01/14  3:08 PM  Result Value Ref Range   Free T4 0.82 0.60 - 1.60 ng/dL  RPR     Status: None   Collection Time: 09/01/14  3:08 PM  Result Value Ref Range   RPR NON REAC NON REAC  Testosterone, free, total     Status: Abnormal   Collection Time: 09/01/14  3:08 PM  Result Value Ref Range   Testosterone 209 (L) 300 - 890 ng/dL    Comment:           Tanner Stage       Male              Male               I              < 30 ng/dL        < 10 ng/dL               II             < 150 ng/dL       < 30 ng/dL               III            100-320 ng/dL     < 35 ng/dL               IV             200-970 ng/dL     15-40 ng/dL               V/Adult        300-890 ng/dL     10-70 ng/dL      Sex Hormone Binding 62 13 - 71 nmol/L   Testosterone, Free 26.0 (L) 47.0 - 244.0 pg/mL    Comment:   The concentration of free testosterone is derived from a mathematical expression based on constants for the binding of testosterone to sex hormone-binding globulin and albumin.    Testosterone-% Free 1.2 (L) 1.6 - 2.9 %   Assessment/Plan: Memory loss Will obtain dementia panel at today's visit.  Likely partially due to low testosterone that cannot be treated presently due to prostate carcinoma.  Chest pain Unclear  etiology but thought to be combination of COPD exacerbation and reaction to Levaquin. Resolved.  Will obtain labs today. Patient to continue medications as directed.  Levaquin added to allergies.  Hypertension Will discontinue losartan due to lightheadedness.  Will begin cardioselective BB -- Toprol XL to take daily. Continue other medications as directed. Follow-up in 1 month.  Prostate cancer Biopsy canceled due to  hospitalization.  Patient with scheduled follow-up with Urology.  Urged patient to please follow through with treatment.

## 2014-09-01 NOTE — Progress Notes (Signed)
Pre visit review using our clinic review tool, if applicable. No additional management support is needed unless otherwise documented below in the visit note. 

## 2014-09-01 NOTE — Patient Instructions (Signed)
Please stop the losartan.  Continue amlodipine and begin the Toprol-XL daily.  Continue other medications as directed.  Follow-up in 1 month.  Please stop by the lab for blood work.  I will call you with results.  We will proceed with further workup if indicated.  I will try to contact Dr. Mare Ferrari and Dr. Jeffie Pollock in regards to getting you cleared for your procedure.  I will call you once I have spoken with them.

## 2014-09-02 ENCOUNTER — Telehealth (HOSPITAL_COMMUNITY): Payer: Self-pay | Admitting: *Deleted

## 2014-09-02 LAB — TESTOSTERONE, FREE, TOTAL, SHBG
SEX HORMONE BINDING: 62 nmol/L (ref 13–71)
TESTOSTERONE-% FREE: 1.2 % — AB (ref 1.6–2.9)
TESTOSTERONE: 209 ng/dL — AB (ref 300–890)
Testosterone, Free: 26 pg/mL — ABNORMAL LOW (ref 47.0–244.0)

## 2014-09-02 LAB — T4, FREE: Free T4: 0.82 ng/dL (ref 0.60–1.60)

## 2014-09-02 LAB — CBC
HCT: 38.1 % — ABNORMAL LOW (ref 39.0–52.0)
HEMOGLOBIN: 12.4 g/dL — AB (ref 13.0–17.0)
MCHC: 32.7 g/dL (ref 30.0–36.0)
MCV: 96.5 fl (ref 78.0–100.0)
PLATELETS: 230 10*3/uL (ref 150.0–400.0)
RBC: 3.95 Mil/uL — AB (ref 4.22–5.81)
RDW: 15 % (ref 11.5–15.5)
WBC: 6.5 10*3/uL (ref 4.0–10.5)

## 2014-09-02 LAB — RPR

## 2014-09-02 LAB — VITAMIN B12: Vitamin B-12: 348 pg/mL (ref 211–911)

## 2014-09-02 LAB — TSH: TSH: 1.01 u[IU]/mL (ref 0.35–4.50)

## 2014-09-02 LAB — B. BURGDORFI ANTIBODIES: B BURGDORFERI AB IGG+ IGM: 0.1 {ISR}

## 2014-09-03 ENCOUNTER — Telehealth: Payer: Self-pay | Admitting: Physician Assistant

## 2014-09-03 NOTE — Telephone Encounter (Signed)
On the Toprol XL he should be just fine.  Take first dose while at home.  If he notices any side effects of medication, stop and call the office.

## 2014-09-03 NOTE — Telephone Encounter (Signed)
I will notify pt that the metoprolol is the new medication. Please advise on the heavy machinery?

## 2014-09-03 NOTE — Telephone Encounter (Signed)
Caller name: Kendarious Relation to pt: self Call back number: [phone] Pharmacy:  Reason for call:   Patient thought that Einar Pheasant was going to start him on a new bp medication but when he went to the pharmacy metoprolol was called in. He also states that on the bottle it says not to operate machinary or drive

## 2014-09-03 NOTE — Telephone Encounter (Signed)
Pt notified. Will take medicine at night.

## 2014-09-07 ENCOUNTER — Telehealth: Payer: Self-pay | Admitting: Physician Assistant

## 2014-09-07 NOTE — Telephone Encounter (Signed)
Pt calling stating he is unable to urinate, has not passed urine since last night around 10. Told the patient that if his urologist is unable to see him today he needs to go to ER or urgent care(per [name]). Pt understood.

## 2014-09-13 ENCOUNTER — Encounter: Payer: Self-pay | Admitting: Physician Assistant

## 2014-09-13 ENCOUNTER — Telehealth: Payer: Self-pay

## 2014-09-13 DIAGNOSIS — R413 Other amnesia: Secondary | ICD-10-CM | POA: Insufficient documentation

## 2014-09-13 NOTE — Telephone Encounter (Signed)
[facility]  Triage Call Report Triage Record Num: [id number] Operator: Feliberto Harts Patient Name: [name] Call Date & Time: 09/08/2014 9:20:00PM Patient Phone: [phone] PCP: [name] Patient Gender: Male PCP Fax : Patient DOB: [birth year] Practice Name: [facility]  Reason for Call: Caller: Christine/Patient; PCP: Leeanne Rio"; CB#: [phone]; Call regarding Took pill and it went down his windpipe; Took a xanax 1 mg and feels a choking sensation. Has drank water but still feels pill in his throat. No problems breathing or swallowing. Triaged per Swallowing Difficulty guideline. Disposition See Provider within 2 weeks guideline per All other situations. Care advice given and voices understanding. .  Protocol(s) Used: Swallowing Difficulty Recommended Outcome per Protocol: See Provider within 2 Weeks Reason for Outcome: All other situations  Care Advice: ~ SYMPTOM / CONDITION MANAGEMENT ~ Call provider if develop pain with swallowing, difficulty swallowing, or unable to keep food down. ~Total water intake includes drinking water, water in beverages, and water contained in food. Fluids make up about 80% of the body's total hydration need. Individual fluid requirement to maintain hydration vary based on physical activity, environmental factors and illness. Limit fluids that contain sugar, caffeine, or alcohol. Urine will be very light yellow color when you drink enough fluids.

## 2014-09-13 NOTE — Assessment & Plan Note (Signed)
Unclear etiology but thought to be combination of COPD exacerbation and reaction to Levaquin. Resolved.  Will obtain labs today. Patient to continue medications as directed.  Levaquin added to allergies.

## 2014-09-13 NOTE — Assessment & Plan Note (Signed)
Will obtain dementia panel at today's visit.  Likely partially due to low testosterone that cannot be treated presently due to prostate carcinoma.

## 2014-09-13 NOTE — Telephone Encounter (Signed)
Pt returning your call back, please call mobile [phone]

## 2014-09-13 NOTE — Telephone Encounter (Signed)
Called and left a message on mobile phone.

## 2014-09-13 NOTE — Assessment & Plan Note (Signed)
Will discontinue losartan due to lightheadedness.  Will begin cardioselective BB -- Toprol XL to take daily. Continue other medications as directed. Follow-up in 1 month.

## 2014-09-13 NOTE — Assessment & Plan Note (Signed)
Biopsy canceled due to hospitalization.  Patient with scheduled follow-up with Urology.  Urged patient to please follow through with treatment.

## 2014-09-13 NOTE — Telephone Encounter (Signed)
Left a message for call back.  

## 2014-09-14 ENCOUNTER — Telehealth: Payer: Self-pay | Admitting: Critical Care Medicine

## 2014-09-14 ENCOUNTER — Ambulatory Visit: Payer: Self-pay | Admitting: Internal Medicine

## 2014-09-14 NOTE — Progress Notes (Signed)
Patient ID: [name], male   DOB: January 15, 1951, 63 y.o.   MRN: [id number] Please notify referring physician of patient's no-show for his consultation today

## 2014-09-14 NOTE — Telephone Encounter (Signed)
Spoke with [name] and scheduled appt with Dr Joya Gaskins on 09/16/14 at 9:00.

## 2014-09-14 NOTE — Telephone Encounter (Signed)
lmtcb X1 with pt to schedule appt in [place] on Thursday.  Will have to doublebook.

## 2014-09-14 NOTE — Telephone Encounter (Signed)
Spoke with Pam at [facility], she faxed a paper this morning for sx clearance for pt.  He is tenatively scheduled for a u/s biopsy of prostate and transurethral resection of prostate, pending sx clearance from [facility].  Pt was last seen on 07/08/14 and is the only time pt has been seen.  No future appts scheduled.  Forwarding to Crystal to look out for paper.  Dr. Joya Gaskins please advise if pt needs to be scheduled with you for this sx clearance.  Thank you.

## 2014-09-14 NOTE — Progress Notes (Signed)
Patient ID: [name], male   DOB: August 01, 1951, 63 y.o.   MRN: [id number] Patient no-showed today's appointment; provider notified for review of record.

## 2014-09-14 NOTE — Telephone Encounter (Signed)
See if he can come in Thursday at [facility] office for preop clearance  i needed to have seen him back before now

## 2014-09-15 ENCOUNTER — Ambulatory Visit (INDEPENDENT_AMBULATORY_CARE_PROVIDER_SITE_OTHER): Payer: Medicare HMO | Admitting: Physician Assistant

## 2014-09-15 ENCOUNTER — Encounter: Payer: Self-pay | Admitting: Physician Assistant

## 2014-09-15 VITALS — BP 125/61 | HR 64 | Temp 97.7°F | Resp 16 | Ht 73.0 in | Wt 218.2 lb

## 2014-09-15 DIAGNOSIS — J441 Chronic obstructive pulmonary disease with (acute) exacerbation: Secondary | ICD-10-CM

## 2014-09-15 MED ORDER — IPRATROPIUM-ALBUTEROL 0.5-2.5 (3) MG/3ML IN SOLN
3.0000 mL | Freq: Once | RESPIRATORY_TRACT | Status: AC
  Administered 2014-09-15: 3 mL via RESPIRATORY_TRACT

## 2014-09-15 MED ORDER — TAMSULOSIN HCL 0.4 MG PO CAPS: 0.4000 mg | ORAL_CAPSULE | Freq: Two times a day (BID) | ORAL | Status: DC

## 2014-09-15 NOTE — Telephone Encounter (Signed)
Pt states that this issue has since resolved.  However, he would still like to come in for an appointment.  States that since the weather has changed, he has been having chest congestion and shortness of breath at night.  He has been taking medications (inhalers and allergy meds) as prescribed, however he feels he needs "a boost" and is requesting a breathing treatment.  An appointment was scheduled today with [name], PA-C at 11:30 am.

## 2014-09-15 NOTE — Patient Instructions (Signed)
Please continue medications as directed.  Stay well hydrated.  Follow-up with Dr. Joya Gaskins tomorrow as scheduled.  I have sent in prednisone to take as directed only if symptoms worsen before appointment tomorrow.

## 2014-09-15 NOTE — Progress Notes (Signed)
Patient presents to clinic today c/o mildly increased SOB and chest tightness over the past several weeks.   Has increased use of COPD medications.  Denies fever, chills, productive cough of pleuritic chest pain. Has follow-up with Pulmonary tomorrow for clearance for prostate surgery.  Dizziness is resolved after discontinuation of the Losartan.  Past Medical History  Diagnosis Date  . Hypertension   . Prostate cancer   . GERD (gastroesophageal reflux disease)   . History of TB (tuberculosis)     1984--  hospitalized for 4 month treatment  . Post-polio syndrome     polio at age 62  . History of chronic bronchitis   . History of rheumatic fever   . COPD, frequent exacerbations     pulmologist-  dr Joya Gaskins--  Gold Stage C  . Bladder outlet obstruction   . Anxiety disorder   . History of urinary retention   . Shortness of breath dyspnea   . Nocturnal oxygen desaturation     USES O2 NIGHTLY  . Prostate cancer   . Complication of anesthesia     DIFFICULT WAKING     Current Outpatient Prescriptions on File Prior to Visit  Medication Sig Dispense Refill  . albuterol (PROVENTIL HFA;VENTOLIN HFA) 108 (90 BASE) MCG/ACT inhaler Inhale 1-2 puffs into the lungs every 6 (six) hours as needed for wheezing or shortness of breath. PROAIR 3 Inhaler 1  . albuterol (PROVENTIL) (2.5 MG/3ML) 0.083% nebulizer solution Take 2.5 mg by nebulization every 6 (six) hours as needed for wheezing or shortness of breath. Pt uses 2-3X daily    . ALPRAZolam (XANAX) 1 MG tablet Take 1 tablet (1 mg total) by mouth 3 (three) times daily as needed for anxiety. 90 tablet 0  . amLODipine (NORVASC) 10 MG tablet Take 10 mg by mouth daily.    . famotidine (PEPCID) 20 MG tablet Take 20 mg by mouth 2 (two) times daily.    . fluticasone (FLONASE) 50 MCG/ACT nasal spray Place 2 sprays into both nostrils daily. 16 g 12  . Fluticasone Furoate-Vilanterol 100-25 MCG/INH AEPB Inhale 1 puff into the lungs daily.    Marland Kitchen  HYDROcodone-acetaminophen (NORCO/VICODIN) 5-325 MG per tablet Take 1 tablet by mouth every 4 (four) hours as needed. 15 tablet 0  . loratadine (CLARITIN) 10 MG tablet Take 10 mg by mouth daily as needed for allergies.    . metoprolol succinate (TOPROL-XL) 25 MG 24 hr tablet Take 1 tablet (25 mg total) by mouth daily. 30 tablet 3  . montelukast (SINGULAIR) 10 MG tablet Take 10 mg by mouth at bedtime.    Marland Kitchen omeprazole (PRILOSEC) 20 MG capsule Take 1 capsule (20 mg total) by mouth daily. 30 capsule 4  . oxyCODONE-acetaminophen (PERCOCET) 10-325 MG per tablet Take 1 tablet by mouth every 8 (eight) hours as needed for pain. 120 tablet 0  . promethazine (PHENERGAN) 12.5 MG tablet Take 1 tablet (12.5 mg total) by mouth every 8 (eight) hours as needed for nausea or vomiting. 30 tablet 0  . tiotropium (SPIRIVA) 18 MCG inhalation capsule Place 18 mcg into inhaler and inhale daily.     No current facility-administered medications on file prior to visit.    Allergies  Allergen Reactions  . Ivp Dye [Iodinated Diagnostic Agents] Anaphylaxis  . Levaquin [Levofloxacin In D5w] Shortness Of Breath    In addition: sweating, chest pain, and diarrhea.   . Penicillins Anaphylaxis    Heart stops  . Aspirin Other (See Comments)    Reaction  unknown  . Morphine And Related Nausea And Vomiting  . Nsaids Other (See Comments)    Difficulty breathing    Family History  Problem Relation Age of Onset  . Alzheimer's disease Father 4    Deceased  . Stomach cancer Father   . Heart attack Father   . Heart disease Father   . Skin cancer Mother     Facial-Living  . Alcohol abuse Sister     x2  . Mental illness Sister     x2  . Diabetes Maternal Aunt     x2  . Thyroid disease Maternal Aunt     x4  . Diabetes Maternal Uncle   . Tuberculosis Paternal Grandfather   . Tuberculosis Paternal Grandmother   . Alzheimer's disease Paternal Aunt   . Alzheimer's disease Paternal Uncle     History   Social History    . Marital Status: Married    Spouse Name: N/A    Number of Children: N/A  . Years of Education: N/A   Social History Main Topics  . Smoking status: Former Smoker -- 67 years    Quit date: 08/25/2013  . Smokeless tobacco: Never Used     Comment: hx  smoke a pack of pipe tobacco(1.5oz) every 10 days.   . Alcohol Use: 12.6 oz/week    21 Cans of beer per week     Comment: average 3 beers per day  . Drug Use: No  . Sexual Activity: None   Other Topics Concern  . None   Social History Narrative   Review of Systems - See HPI.  All other ROS are negative.  BP 125/61 mmHg  Pulse 64  Temp(Src) 97.7 F (36.5 C) (Oral)  Resp 16  Ht 6' 1" (1.854 m)  Wt 218 lb 4 oz (98.998 kg)  BMI 28.80 kg/m2  SpO2 99%  Physical Exam  Constitutional: He is oriented to person, place, and time and well-developed, well-nourished, and in no distress.  HENT:  Head: Normocephalic and atraumatic.  Eyes: Conjunctivae are normal. Pupils are equal, round, and reactive to light.  Neck: Neck supple.  Cardiovascular: Normal rate, regular rhythm, normal heart sounds and intact distal pulses.   Pulmonary/Chest: Effort normal and breath sounds normal. No respiratory distress. He has no rales. He exhibits no tenderness.  + wheeze of lung bases bilaterally.  Lymphadenopathy:    He has no cervical adenopathy.  Neurological: He is alert and oriented to person, place, and time.  Skin: Skin is warm and dry. No rash noted.  Psychiatric: Affect normal.  Vitals reviewed.   Recent Results (from the past 2160 hour(s))  Urinalysis, Routine w reflex microscopic     Status: Abnormal   Collection Time: 06/29/14  1:18 PM  Result Value Ref Range   Color, Urine YELLOW Yellow;Lt. Yellow   APPearance CLEAR Clear   Specific Gravity, Urine 1.015 1.000-1.030   pH 5.5 5.0 - 8.0   Total Protein, Urine NEGATIVE Negative   Urine Glucose NEGATIVE Negative   Ketones, ur NEGATIVE Negative   Bilirubin Urine NEGATIVE Negative    Hgb urine dipstick NEGATIVE Negative   Urobilinogen, UA 0.2 0.0 - 1.0   Leukocytes, UA NEGATIVE Negative   Nitrite NEGATIVE Negative   WBC, UA 3-6/hpf (A) 0-2/hpf   RBC / HPF none seen 0-2/hpf   Squamous Epithelial / LPF Rare(0-4/hpf) Rare(0-4/hpf)  Urine culture     Status: None   Collection Time: 06/29/14  1:45 PM  Result Value Ref Range  Colony Count NO GROWTH    Organism ID, Bacteria NO GROWTH   CBC WITH DIFFERENTIAL     Status: Abnormal   Collection Time: 07/01/14  9:25 PM  Result Value Ref Range   WBC 7.3 4.0 - 10.5 K/uL   RBC 4.27 4.22 - 5.81 MIL/uL   Hemoglobin 13.7 13.0 - 17.0 g/dL   HCT 39.5 39.0 - 52.0 %   MCV 92.5 78.0 - 100.0 fL   MCH 32.1 26.0 - 34.0 pg   MCHC 34.7 30.0 - 36.0 g/dL   RDW 13.0 11.5 - 15.5 %   Platelets 213 150 - 400 K/uL   Neutrophils Relative % 63 43 - 77 %   Neutro Abs 4.6 1.7 - 7.7 K/uL   Lymphocytes Relative 22 12 - 46 %   Lymphs Abs 1.6 0.7 - 4.0 K/uL   Monocytes Relative 8 3 - 12 %   Monocytes Absolute 0.6 0.1 - 1.0 K/uL   Eosinophils Relative 7 (H) 0 - 5 %   Eosinophils Absolute 0.5 0.0 - 0.7 K/uL   Basophils Relative 0 0 - 1 %   Basophils Absolute 0.0 0.0 - 0.1 K/uL  Comprehensive metabolic panel     Status: Abnormal   Collection Time: 07/01/14  9:25 PM  Result Value Ref Range   Sodium 137 137 - 147 mEq/L   Potassium 4.5 3.7 - 5.3 mEq/L   Chloride 97 96 - 112 mEq/L   CO2 27 19 - 32 mEq/L   Glucose, Bld 122 (H) 70 - 99 mg/dL   BUN 15 6 - 23 mg/dL   Creatinine, Ser 1.40 (H) 0.50 - 1.35 mg/dL   Calcium 9.4 8.4 - 10.5 mg/dL   Total Protein 7.4 6.0 - 8.3 g/dL   Albumin 3.9 3.5 - 5.2 g/dL   AST 20 0 - 37 U/L   ALT 25 0 - 53 U/L   Alkaline Phosphatase 63 39 - 117 U/L   Total Bilirubin 0.6 0.3 - 1.2 mg/dL   GFR calc non Af Amer 52 (L) >90 mL/min   GFR calc Af Amer 60 (L) >90 mL/min    Comment: (NOTE) The eGFR has been calculated using the CKD EPI equation. This calculation has not been validated in all clinical situations. eGFR's  persistently <90 mL/min signify possible Chronic Kidney Disease.   Anion gap 13 5 - 15  Urinalysis with microscopic     Status: None   Collection Time: 07/01/14  9:37 PM  Result Value Ref Range   Color, Urine YELLOW YELLOW   APPearance CLEAR CLEAR   Specific Gravity, Urine 1.006 1.005 - 1.030   pH 7.0 5.0 - 8.0   Glucose, UA NEGATIVE NEGATIVE mg/dL   Hgb urine dipstick NEGATIVE NEGATIVE   Bilirubin Urine NEGATIVE NEGATIVE   Ketones, ur NEGATIVE NEGATIVE mg/dL   Protein, ur NEGATIVE NEGATIVE mg/dL   Urobilinogen, UA 0.2 0.0 - 1.0 mg/dL   Nitrite NEGATIVE NEGATIVE   Leukocytes, UA NEGATIVE NEGATIVE    Comment: MICROSCOPIC NOT DONE ON URINES WITH NEGATIVE PROTEIN, BLOOD, LEUKOCYTES, NITRITE, OR GLUCOSE <1000 mg/dL.  Urinalysis, Routine w reflex microscopic     Status: Abnormal   Collection Time: 08/05/14  4:27 PM  Result Value Ref Range   Color, Urine YELLOW YELLOW   APPearance CLEAR CLEAR   Specific Gravity, Urine 1.005 1.005 - 1.030   pH 6.0 5.0 - 8.0   Glucose, UA NEGATIVE NEGATIVE mg/dL   Hgb urine dipstick NEGATIVE NEGATIVE   Bilirubin Urine NEGATIVE  NEGATIVE   Ketones, ur NEGATIVE NEGATIVE mg/dL   Protein, ur NEGATIVE NEGATIVE mg/dL   Urobilinogen, UA 0.2 0.0 - 1.0 mg/dL   Nitrite NEGATIVE NEGATIVE   Leukocytes, UA TRACE (A) NEGATIVE  Urine microscopic-add on     Status: None   Collection Time: 08/05/14  4:27 PM  Result Value Ref Range   Squamous Epithelial / LPF RARE RARE   WBC, UA 0-2 <3 WBC/hpf   Bacteria, UA RARE RARE  CBC with Differential     Status: Abnormal   Collection Time: 08/05/14  5:34 PM  Result Value Ref Range   WBC 5.8 4.0 - 10.5 K/uL   RBC 4.20 (L) 4.22 - 5.81 MIL/uL   Hemoglobin 13.1 13.0 - 17.0 g/dL   HCT 39.2 39.0 - 52.0 %   MCV 93.3 78.0 - 100.0 fL   MCH 31.2 26.0 - 34.0 pg   MCHC 33.4 30.0 - 36.0 g/dL   RDW 13.4 11.5 - 15.5 %   Platelets 227 150 - 400 K/uL   Neutrophils Relative % 71 43 - 77 %   Neutro Abs 4.2 1.7 - 7.7 K/uL   Lymphocytes  Relative 19 12 - 46 %   Lymphs Abs 1.1 0.7 - 4.0 K/uL   Monocytes Relative 7 3 - 12 %   Monocytes Absolute 0.4 0.1 - 1.0 K/uL   Eosinophils Relative 3 0 - 5 %   Eosinophils Absolute 0.2 0.0 - 0.7 K/uL   Basophils Relative 0 0 - 1 %   Basophils Absolute 0.0 0.0 - 0.1 K/uL  Comprehensive metabolic panel     Status: Abnormal   Collection Time: 08/05/14  5:34 PM  Result Value Ref Range   Sodium 137 137 - 147 mEq/L   Potassium 4.0 3.7 - 5.3 mEq/L   Chloride 96 96 - 112 mEq/L   CO2 25 19 - 32 mEq/L   Glucose, Bld 100 (H) 70 - 99 mg/dL   BUN 16 6 - 23 mg/dL   Creatinine, Ser 1.10 0.50 - 1.35 mg/dL   Calcium 9.7 8.4 - 10.5 mg/dL   Total Protein 7.5 6.0 - 8.3 g/dL   Albumin 4.1 3.5 - 5.2 g/dL   AST 22 0 - 37 U/L   ALT 24 0 - 53 U/L   Alkaline Phosphatase 68 39 - 117 U/L   Total Bilirubin 0.4 0.3 - 1.2 mg/dL   GFR calc non Af Amer 69 (L) >90 mL/min   GFR calc Af Amer 80 (L) >90 mL/min    Comment: (NOTE) The eGFR has been calculated using the CKD EPI equation. This calculation has not been validated in all clinical situations. eGFR's persistently <90 mL/min signify possible Chronic Kidney Disease.   Anion gap 16 (H) 5 - 15  Lipase, blood     Status: None   Collection Time: 08/05/14  5:39 PM  Result Value Ref Range   Lipase 35 11 - 59 U/L  Basic Metabolic Panel (BMET)     Status: None   Collection Time: 08/13/14  3:56 PM  Result Value Ref Range   Sodium 135 135 - 145 mEq/L   Potassium 4.7 3.5 - 5.3 mEq/L   Chloride 104 96 - 112 mEq/L   CO2 21 19 - 32 mEq/L   Glucose, Bld 74 70 - 99 mg/dL   BUN 13 6 - 23 mg/dL   Creat 1.18 0.50 - 1.35 mg/dL   Calcium 9.4 8.4 - 10.5 mg/dL  Hemoglobin A1c     Status:  Abnormal   Collection Time: 08/13/14  3:56 PM  Result Value Ref Range   Hgb A1c MFr Bld 6.0 (H) <5.7 %    Comment:                                                                        According to the ADA Clinical Practice Recommendations for 2011, when HbA1c is used as a  screening test:     >=6.5%   Diagnostic of Diabetes Mellitus            (if abnormal result is confirmed)   5.7-6.4%   Increased risk of developing Diabetes Mellitus   References:Diagnosis and Classification of Diabetes Mellitus,Diabetes NVBT,6606,00(KHTXH 1):S62-S69 and Standards of Medical Care in         Diabetes - 2011,Diabetes Care,2011,34 (Suppl 1):S11-S61.     Mean Plasma Glucose 126 (H) <117 mg/dL  Osmolality     Status: None   Collection Time: 08/13/14  3:56 PM  Result Value Ref Range   Osmolality 289 275 - 300 mOsm/kg  Osmolality, urine     Status: Abnormal   Collection Time: 08/13/14  3:56 PM  Result Value Ref Range   Osmolality, Ur 211 (L) 390 - 1090 mOsm/kg  Urinalysis, Routine w reflex microscopic     Status: None   Collection Time: 08/13/14  3:56 PM  Result Value Ref Range   Color, Urine YELLOW YELLOW   APPearance CLEAR CLEAR   Specific Gravity, Urine 1.009 1.005 - 1.030   pH 5.0 5.0 - 8.0   Glucose, UA NEG NEG mg/dL   Bilirubin Urine NEG NEG   Ketones, ur NEG NEG mg/dL   Hgb urine dipstick NEG NEG   Protein, ur NEG NEG mg/dL   Urobilinogen, UA 0.2 0.0 - 1.0 mg/dL   Nitrite NEG NEG   Leukocytes, UA NEG NEG  CBC     Status: Abnormal   Collection Time: 08/18/14 11:55 AM  Result Value Ref Range   WBC 6.7 4.0 - 10.5 K/uL   RBC 4.21 (L) 4.22 - 5.81 MIL/uL   Hemoglobin 13.4 13.0 - 17.0 g/dL   HCT 38.3 (L) 39.0 - 52.0 %   MCV 91.0 78.0 - 100.0 fL   MCH 31.8 26.0 - 34.0 pg   MCHC 35.0 30.0 - 36.0 g/dL   RDW 13.2 11.5 - 15.5 %   Platelets 259 150 - 400 K/uL  Comprehensive metabolic panel     Status: Abnormal   Collection Time: 08/18/14 11:55 AM  Result Value Ref Range   Sodium 137 137 - 147 mEq/L   Potassium 4.2 3.7 - 5.3 mEq/L   Chloride 101 96 - 112 mEq/L   CO2 22 19 - 32 mEq/L   Glucose, Bld 102 (H) 70 - 99 mg/dL   BUN 15 6 - 23 mg/dL   Creatinine, Ser 1.10 0.50 - 1.35 mg/dL   Calcium 9.5 8.4 - 10.5 mg/dL   Total Protein 7.4 6.0 - 8.3 g/dL   Albumin  3.9 3.5 - 5.2 g/dL   AST 21 0 - 37 U/L   ALT 26 0 - 53 U/L   Alkaline Phosphatase 76 39 - 117 U/L   Total Bilirubin 0.3 0.3 - 1.2 mg/dL  GFR calc non Af Amer 69 (L) >90 mL/min   GFR calc Af Amer 80 (L) >90 mL/min    Comment: (NOTE) The eGFR has been calculated using the CKD EPI equation. This calculation has not been validated in all clinical situations. eGFR's persistently <90 mL/min signify possible Chronic Kidney Disease.    Anion gap 14 5 - 15  Troponin I (order at [facility] [place])     Status: None   Collection Time: 08/18/14 11:55 AM  Result Value Ref Range   Troponin I <0.30 <0.30 ng/mL    Comment:        Due to the release kinetics of cTnI, a negative result within the first hours of the onset of symptoms does not rule out myocardial infarction with certainty. If myocardial infarction is still suspected, repeat the test at appropriate intervals.   Troponin I-serum (0, 3, 6 hours)     Status: None   Collection Time: 08/18/14  6:17 PM  Result Value Ref Range   Troponin I <0.30 <0.30 ng/mL    Comment:        Due to the release kinetics of cTnI, a negative result within the first hours of the onset of symptoms does not rule out myocardial infarction with certainty. If myocardial infarction is still suspected, repeat the test at appropriate intervals.   CBC     Status: Abnormal   Collection Time: 08/18/14  6:17 PM  Result Value Ref Range   WBC 5.9 4.0 - 10.5 K/uL   RBC 4.12 (L) 4.22 - 5.81 MIL/uL   Hemoglobin 12.7 (L) 13.0 - 17.0 g/dL   HCT 37.1 (L) 39.0 - 52.0 %   MCV 90.0 78.0 - 100.0 fL   MCH 30.8 26.0 - 34.0 pg   MCHC 34.2 30.0 - 36.0 g/dL   RDW 13.5 11.5 - 15.5 %   Platelets 267 150 - 400 K/uL  Creatinine, serum     Status: Abnormal   Collection Time: 08/18/14  6:17 PM  Result Value Ref Range   Creatinine, Ser 1.04 0.50 - 1.35 mg/dL   GFR calc non Af Amer 74 (L) >90 mL/min   GFR calc Af Amer 86 (L) >90 mL/min    Comment: (NOTE) The eGFR has been calculated using  the CKD EPI equation. This calculation has not been validated in all clinical situations. eGFR's persistently <90 mL/min signify possible Chronic Kidney Disease.   D-dimer, quantitative     Status: None   Collection Time: 08/18/14  6:17 PM  Result Value Ref Range   D-Dimer, Quant 0.36 0.00 - 0.48 ug/mL-FEU    Comment:        AT THE INHOUSE ESTABLISHED CUTOFF VALUE OF 0.48 ug/mL FEU, THIS ASSAY HAS BEEN DOCUMENTED IN THE LITERATURE TO HAVE A SENSITIVITY AND NEGATIVE PREDICTIVE VALUE OF AT LEAST 98 TO 99%.  THE TEST RESULT SHOULD BE CORRELATED WITH AN ASSESSMENT OF THE CLINICAL PROBABILITY OF DVT / VTE.   Troponin I-serum (0, 3, 6 hours)     Status: None   Collection Time: 08/18/14  8:25 PM  Result Value Ref Range   Troponin I <0.30 <0.30 ng/mL    Comment:        Due to the release kinetics of cTnI, a negative result within the first hours of the onset of symptoms does not rule out myocardial infarction with certainty. If myocardial infarction is still suspected, repeat the test at appropriate intervals.   Troponin I-serum (0, 3, 6 hours)  Status: None   Collection Time: 08/18/14 11:45 PM  Result Value Ref Range   Troponin I <0.30 <0.30 ng/mL    Comment:        Due to the release kinetics of cTnI, a negative result within the first hours of the onset of symptoms does not rule out myocardial infarction with certainty. If myocardial infarction is still suspected, repeat the test at appropriate intervals.   Urinalysis, Routine w reflex microscopic     Status: None   Collection Time: 08/28/14 12:53 PM  Result Value Ref Range   Color, Urine YELLOW YELLOW   APPearance CLEAR CLEAR   Specific Gravity, Urine 1.014 1.005 - 1.030   pH 5.5 5.0 - 8.0   Glucose, UA NEGATIVE NEGATIVE mg/dL   Hgb urine dipstick NEGATIVE NEGATIVE   Bilirubin Urine NEGATIVE NEGATIVE   Ketones, ur NEGATIVE NEGATIVE mg/dL   Protein, ur NEGATIVE NEGATIVE mg/dL   Urobilinogen, UA 0.2 0.0 - 1.0  mg/dL   Nitrite NEGATIVE NEGATIVE   Leukocytes, UA NEGATIVE NEGATIVE    Comment: MICROSCOPIC NOT DONE ON URINES WITH NEGATIVE PROTEIN, BLOOD, LEUKOCYTES, NITRITE, OR GLUCOSE <1000 mg/dL.  CBC     Status: Abnormal   Collection Time: 09/01/14  3:08 PM  Result Value Ref Range   WBC 6.5 4.0 - 10.5 K/uL   RBC 3.95 (L) 4.22 - 5.81 Mil/uL   Platelets 230.0 150.0 - 400.0 K/uL   Hemoglobin 12.4 (L) 13.0 - 17.0 g/dL   HCT 38.1 (L) 39.0 - 52.0 %   MCV 96.5 78.0 - 100.0 fl   MCHC 32.7 30.0 - 36.0 g/dL   RDW 15.0 11.5 - 15.5 %  TSH     Status: None   Collection Time: 09/01/14  3:08 PM  Result Value Ref Range   TSH 1.01 0.35 - 4.50 uIU/mL  B12     Status: None   Collection Time: 09/01/14  3:08 PM  Result Value Ref Range   Vitamin B-12 348 211 - 911 pg/mL  B. Burgdorfi Antibodies     Status: None   Collection Time: 09/01/14  3:08 PM  Result Value Ref Range   B burgdorferi Ab IgG+IgM 0.10 ISR    Comment: Antibody to Borrelia burgdorferi not detected.      ISR = Immune Status Ratio              <0.90         ISR       Negative              0.90 - 1.09   ISR       Equivocal              >=1.10        ISR       Positive   T4, free     Status: None   Collection Time: 09/01/14  3:08 PM  Result Value Ref Range   Free T4 0.82 0.60 - 1.60 ng/dL  RPR     Status: None   Collection Time: 09/01/14  3:08 PM  Result Value Ref Range   RPR NON REAC NON REAC  Testosterone, free, total     Status: Abnormal   Collection Time: 09/01/14  3:08 PM  Result Value Ref Range   Testosterone 209 (L) 300 - 890 ng/dL    Comment:           Tanner Stage       Male  Male               I              < 30 ng/dL        < 10 ng/dL               II             < 150 ng/dL       < 30 ng/dL               III            100-320 ng/dL     < 35 ng/dL               IV             200-970 ng/dL     15-40 ng/dL               V/Adult        300-890 ng/dL     10-70 ng/dL      Sex Hormone Binding 62 13 - 71  nmol/L   Testosterone, Free 26.0 (L) 47.0 - 244.0 pg/mL    Comment:   The concentration of free testosterone is derived from a mathematical expression based on constants for the binding of testosterone to sex hormone-binding globulin and albumin.    Testosterone-% Free 1.2 (L) 1.6 - 2.9 %    Assessment/Plan: COPD exacerbation Mild at present.  Symptoms markedly improved with Duoneb.  Will Rx burst of prednisone to take if symptoms worsen. Continue COPD medications as directed.  Follow-up with Pulmonology tomorrow as scheduled.

## 2014-09-15 NOTE — Progress Notes (Signed)
Pre visit review using our clinic review tool, if applicable. No additional management support is needed unless otherwise documented below in the visit note/SLS  

## 2014-09-15 NOTE — Telephone Encounter (Signed)
Surgery Clearance letter received from [facility] and placed with paperwork form tomorrow's appts.

## 2014-09-16 ENCOUNTER — Encounter: Payer: Self-pay | Admitting: Critical Care Medicine

## 2014-09-16 ENCOUNTER — Other Ambulatory Visit: Payer: Self-pay | Admitting: Critical Care Medicine

## 2014-09-16 ENCOUNTER — Ambulatory Visit (INDEPENDENT_AMBULATORY_CARE_PROVIDER_SITE_OTHER): Payer: Medicare HMO | Admitting: Critical Care Medicine

## 2014-09-16 VITALS — BP 130/62 | HR 77 | Temp 97.6°F | Ht 73.0 in | Wt 218.0 lb

## 2014-09-16 DIAGNOSIS — J449 Chronic obstructive pulmonary disease, unspecified: Secondary | ICD-10-CM

## 2014-09-16 NOTE — Assessment & Plan Note (Signed)
Gold C copd with frequent exacerbations, now improved on therapy. The pt is cleared for planned urologic surgery Cont curr inhaled med s

## 2014-09-16 NOTE — Patient Instructions (Signed)
You are cleared for surgery  No medication changes Return 4 months

## 2014-09-16 NOTE — Progress Notes (Signed)
Subjective:    Patient ID: [name], male    DOB: [birth year], 63 y.o.   MRN: [id number]  HPI   09/16/2014 Chief Complaint  Patient presents with  . Follow-up    f/u COPD; discuss surgerical clearance - biopsy of prostate cancer   Overall dyspnea is better, any strenous work is an issue if does heavy lifting. Now off prednisone.     Post polio syndrome with R hemidiaphragm elevation. Lending pt to have restrictive component   Pt needs prostate bx Pt denies any significant sore throat, nasal congestion or excess secretions, fever, chills, sweats, unintended weight loss, pleurtic or exertional chest pain, orthopnea PND, or leg swelling Pt denies any increase in rescue therapy over baseline, denies waking up needing it or having any early am or nocturnal exacerbations of coughing/wheezing/or dyspnea. Pt also denies any obvious fluctuation in symptoms with  weather or environmental change or other alleviating or aggravating factors     Review of Systems  Constitutional: Positive for fatigue. Negative for fever and unexpected weight change.  HENT: Positive for congestion and postnasal drip. Negative for dental problem, ear pain, nosebleeds, rhinorrhea, sinus pressure, sneezing, sore throat and trouble swallowing.   Eyes: Negative for redness and itching.  Respiratory: Positive for cough, chest tightness and shortness of breath. Negative for wheezing.   Cardiovascular: Negative for palpitations and leg swelling.  Gastrointestinal: Negative for nausea and vomiting.  Genitourinary: Negative for dysuria.  Musculoskeletal: Negative for joint swelling.  Skin: Negative for rash.  Neurological: Negative for headaches.  Hematological: Does not bruise/bleed easily.  Psychiatric/Behavioral: Negative for dysphoric mood. The patient is not nervous/anxious.        Objective:   Physical Exam Filed Vitals:   09/16/14 0912  BP: 130/62  Pulse: 77  Temp: 97.6 F (36.4 C)  TempSrc:  Oral  Height: 6\' 1"  (1.854 m)  Weight: 218 lb (98.884 kg)  SpO2: 98%    Gen: Pleasant, well-nourished, in no distress,  normal affect  ENT: No lesions,  mouth clear,  oropharynx clear, no postnasal drip  Neck: No JVD, no TMG, no carotid bruits  Lungs: No use of accessory muscles, no dullness to percussion, distant BS  Cardiovascular: RRR, heart sounds normal, no murmur or gallops, no peripheral edema  Abdomen: soft and NT, no HSM,  BS normal  Musculoskeletal: No deformities, no cyanosis or clubbing  Neuro: alert, non focal  Skin: Warm, no lesions or rashes  No results found.      Assessment & Plan:   COPD (chronic obstructive pulmonary disease) Gold C Frequent exacerbations Gold C copd with frequent exacerbations, now improved on therapy. The pt is cleared for planned urologic surgery Cont curr inhaled med s      Updated Medication List Outpatient Encounter Prescriptions as of 09/16/2014  Medication Sig  . albuterol (PROVENTIL HFA;VENTOLIN HFA) 108 (90 BASE) MCG/ACT inhaler Inhale 1-2 puffs into the lungs every 6 (six) hours as needed for wheezing or shortness of breath. PROAIR  . albuterol (PROVENTIL) (2.5 MG/3ML) 0.083% nebulizer solution Take 2.5 mg by nebulization every 6 (six) hours as needed for wheezing or shortness of breath. Pt uses 2-3X daily  . ALPRAZolam (XANAX) 1 MG tablet Take 1 tablet (1 mg total) by mouth 3 (three) times daily as needed for anxiety.  Marland Kitchen amLODipine (NORVASC) 10 MG tablet Take 10 mg by mouth daily.  . famotidine (PEPCID) 20 MG tablet Take 20 mg by mouth 2 (two) times daily.  . fluticasone (FLONASE)  50 MCG/ACT nasal spray Place 2 sprays into both nostrils daily.  . Fluticasone Furoate-Vilanterol 100-25 MCG/INH AEPB Inhale 1 puff into the lungs daily.  Marland Kitchen HYDROcodone-acetaminophen (NORCO/VICODIN) 5-325 MG per tablet Take 1 tablet by mouth every 4 (four) hours as needed.  . loratadine (CLARITIN) 10 MG tablet Take 10 mg by mouth daily as needed  for allergies.  . metoprolol succinate (TOPROL-XL) 25 MG 24 hr tablet Take 1 tablet (25 mg total) by mouth daily.  . montelukast (SINGULAIR) 10 MG tablet Take 10 mg by mouth at bedtime.  Marland Kitchen omeprazole (PRILOSEC) 20 MG capsule Take 1 capsule (20 mg total) by mouth daily.  Marland Kitchen oxyCODONE-acetaminophen (PERCOCET) 10-325 MG per tablet Take 1 tablet by mouth every 8 (eight) hours as needed for pain.  . promethazine (PHENERGAN) 12.5 MG tablet Take 1 tablet (12.5 mg total) by mouth every 8 (eight) hours as needed for nausea or vomiting.  . tamsulosin (FLOMAX) 0.4 MG CAPS capsule Take 1 capsule (0.4 mg total) by mouth 2 (two) times daily.  Marland Kitchen tiotropium (SPIRIVA) 18 MCG inhalation capsule Place 18 mcg into inhaler and inhale daily.  . [DISCONTINUED] ALPRAZolam (XANAX) 0.5 MG tablet Take 0.5 mg by mouth at bedtime as needed for anxiety.  . [DISCONTINUED] predniSONE (DELTASONE) 20 MG tablet Take 2.5 tablets (50 mg total) by mouth daily with breakfast. Take 40mg  for 2days then 20mg  for 2days then STOP (Patient not taking: Reported on 09/16/2014)

## 2014-09-17 NOTE — Telephone Encounter (Signed)
Received refill request for prednisone from pharmacy.  Medication not on pt's current med list.   Spoke with pt - reports he only takes prednisone prn for flare ups.  Reports he is not currently in a flare up, breathing is doing fine and pred rx is not needed. Advised will not approve rx and to call back if needed. He verbalized understanding, is in agreement with plan, and voiced no further questions or concerns at this time.

## 2014-09-18 DIAGNOSIS — I10 Essential (primary) hypertension: Secondary | ICD-10-CM

## 2014-09-18 DIAGNOSIS — C61 Malignant neoplasm of prostate: Secondary | ICD-10-CM

## 2014-09-18 DIAGNOSIS — R079 Chest pain, unspecified: Secondary | ICD-10-CM

## 2014-09-18 NOTE — Assessment & Plan Note (Signed)
Mild at present.  Symptoms markedly improved with Duoneb.  Will Rx burst of prednisone to take if symptoms worsen. Continue COPD medications as directed.  Follow-up with Pulmonology tomorrow as scheduled.

## 2014-09-20 ENCOUNTER — Other Ambulatory Visit: Payer: Medicare HMO | Admitting: Urology

## 2014-09-20 MED ORDER — VANCOMYCIN HCL 10 G IV SOLR: 1000.0000 mg | Freq: Once | INTRAVENOUS | Status: DC

## 2014-09-20 MED ORDER — GENTAMICIN SULFATE 40 MG/ML IJ SOLN: 2.0000 mg/kg | Freq: Once | INTRAVENOUS | Status: DC

## 2014-09-22 ENCOUNTER — Other Ambulatory Visit: Payer: Self-pay | Admitting: Urology

## 2014-09-23 ENCOUNTER — Encounter (HOSPITAL_COMMUNITY)
Admission: RE | Admit: 2014-09-23 | Discharge: 2014-09-23 | Disposition: A | Discharge status: Home / Self Care | Payer: Medicare HMO | Source: Ambulatory Visit | Attending: Urology | Admitting: Urology

## 2014-09-23 ENCOUNTER — Encounter (HOSPITAL_COMMUNITY): Payer: Self-pay

## 2014-09-23 DIAGNOSIS — Z01812 Encounter for preprocedural laboratory examination: Secondary | ICD-10-CM | POA: Diagnosis not present

## 2014-09-23 HISTORY — DX: Cardiac arrhythmia, unspecified: I49.9

## 2014-09-23 HISTORY — DX: Type 2 diabetes mellitus without complications: E11.9

## 2014-09-23 HISTORY — DX: Unspecified osteoarthritis, unspecified site: M19.90

## 2014-09-23 LAB — BASIC METABOLIC PANEL
Anion gap: 12 (ref 5–15)
BUN: 12 mg/dL (ref 6–23)
CALCIUM: 9.3 mg/dL (ref 8.4–10.5)
CO2: 25 mEq/L (ref 19–32)
Chloride: 103 mEq/L (ref 96–112)
Creatinine, Ser: 1.28 mg/dL (ref 0.50–1.35)
GFR calc Af Amer: 67 mL/min — ABNORMAL LOW (ref >90)
GFR calc non Af Amer: 58 mL/min — ABNORMAL LOW (ref >90)
GLUCOSE: 97 mg/dL (ref 70–99)
POTASSIUM: 4.5 meq/L (ref 3.7–5.3)
Sodium: 140 mEq/L (ref 137–147)

## 2014-09-23 LAB — CBC
HEMATOCRIT: 40.9 % (ref 39.0–52.0)
Hemoglobin: 13.7 g/dL (ref 13.0–17.0)
MCH: 31.5 pg (ref 26.0–34.0)
MCHC: 33.5 g/dL (ref 30.0–36.0)
MCV: 94 fL (ref 78.0–100.0)
Platelets: 268 10*3/uL (ref 150–400)
RBC: 4.35 MIL/uL (ref 4.22–5.81)
RDW: 13.8 % (ref 11.5–15.5)
WBC: 6.4 10*3/uL (ref 4.0–10.5)

## 2014-09-23 NOTE — Pre-Procedure Instructions (Signed)
EKG AND CXR REPORTS ARE IN EPIC FROM 08-18-14. NORMAL NUCLEAR STRESS TEST REPORT IN EPIC FROM 06/08/14 AND CARDIOLOGY OFFICE NOTE  IN EPIC 05-28-14 DR. BRACKBILL. PULMONARY CLEARANCE ON CHART FROM DR. [place] WITH OFFICE NOTE 09/16/14.

## 2014-09-23 NOTE — Anesthesia Preprocedure Evaluation (Addendum)
Anesthesia Evaluation  Patient identified by MRN, date of birth, ID band Patient awake    Reviewed: Allergy & Precautions, H&P , NPO status , Patient's Chart, lab work & pertinent test results  History of Anesthesia Complications (+) history of anesthetic complications  Airway Mallampati: II  TM Distance: >3 FB Neck ROM: Full    Dental no notable dental hx. (+) Dental Advisory Given, Poor Dentition, Partial Upper   Pulmonary shortness of breath, sleep apnea and Continuous Positive Airway Pressure Ventilation , COPD COPD inhaler, former smoker,  Elevated hemidiaphragm Cleared for surgery by pulmonologist, no recent exacerbation, reports breathing as normal, 2L home O2 at night breath sounds clear to auscultation  Pulmonary exam normal       Cardiovascular hypertension, Pt. on medications and Pt. on home beta blockers + dysrhythmias Rhythm:Regular Rate:Normal  Echo 08/2014 Study Conclusions  - Left ventricle: The cavity size was normal. There was mildconcentric hypertrophy. Systolic function was normal. Theestimated ejection fraction was in the range of 55% to 60%. Wallmotion was normal; there were no regional wall motionabnormalities.   Neuro/Psych PSYCHIATRIC DISORDERS Anxiety Bipolar Disorder negative neurological ROS     GI/Hepatic Neg liver ROS, GERD-  Medicated and Controlled,  Endo/Other  diabetes, Type 2, Oral Hypoglycemic AgentsObesity   Renal/GU Renal diseasenegative Renal ROS  negative genitourinary   Musculoskeletal negative musculoskeletal ROS (+) Arthritis -,   Abdominal (+) + obese,   Peds negative pediatric ROS (+)  Hematology negative hematology ROS (+)   Anesthesia Other Findings   Reproductive/Obstetrics negative OB ROS                        Anesthesia Physical Anesthesia Plan  ASA: III  Anesthesia Plan: General   Post-op Pain Management:    Induction:  Intravenous  Airway Management Planned: LMA  Additional Equipment:   Intra-op Plan:   Post-operative Plan: Extubation in OR and Possible Post-op intubation/ventilation  Informed Consent: I have reviewed the patients History and Physical, chart, labs and discussed the procedure including the risks, benefits and alternatives for the proposed anesthesia with the patient or authorized representative who has indicated his/her understanding and acceptance.   Dental advisory given  Plan Discussed with: CRNA  Anesthesia Plan Comments: (Discussed risk of COPD exacerbation with general anesthetic, but patient refused spinal anesthetic. )      Anesthesia Quick Evaluation

## 2014-09-23 NOTE — Patient Instructions (Signed)
[name]  09/23/2014   Your procedure is scheduled on:   Tuesday  December 15th  Report to [facility] at  11:00 AM.  Call this number if you have problems the morning of surgery [phone]   Remember:  Do not eat food :After [place].  YOU MAY HAVE CLEAR LIQUIDS TO DRINK FROM MIDNIGHT UNTIL 7:00 AM DAY OF SURGERY - LIKE WATER,                                                        NOTHING TO DRINK AFTER 7:OO AM DAY OF SURGERY.     Take these medicines the morning of surgery with A SIP OF WATER:   USE YOUR PRO AIR, SPIRIVA, BREO ELLIPTA INHALERS AND YOUR ALBUTEROL NEBULIZER.  TAKE YOUR ALPRAZOLAM, AMLODIPINE, METOPROLOL, PRILOSEC, FLOMAX.                               You may not have any metal on your body including hair pins and              piercings  Do not wear jewelry, make-up, lotions, powders or perfumes.             Do not wear nail polish.  Do not shave  48 hours prior to surgery.              Men may shave face and neck.   Do not bring valuables to the hospital. [place].  Contacts, dentures or bridgework may not be worn into surgery.  Leave suitcase in the car. After surgery it may be brought to your room.     Patients discharged the day of surgery will not be allowed to drive home.  Name and phone number of your driver:  [place]  Special Instructions: N/A              Please read over the following fact sheets you were given: _____________________________________________________________________             [facility] - Preparing for Surgery Before surgery, you can play an important role.  Because skin is not sterile, your skin needs to be as free of germs as possible.  You can reduce the number of germs on your skin by washing with CHG (chlorahexidine gluconate) soap before  surgery.  CHG is an antiseptic cleaner which kills germs and bonds with the skin to continue killing germs even after washing. Please DO NOT use if you have an allergy to CHG or antibacterial soaps.  If your skin becomes reddened/irritated stop using the CHG and inform your nurse when you arrive at Short Stay. Do not shave (including legs and underarms) for at least 48 hours prior to the first CHG shower.  You may shave your face/neck. Please follow these instructions carefully:  1.  Shower with CHG Soap the night before surgery and the  morning of Surgery.  2.  If you choose to wash your hair, wash your hair first as usual with your  normal  shampoo.  3.  After you shampoo, rinse your hair and body thoroughly to remove the  shampoo.                           4.  Use CHG as you would any other liquid soap.  You can apply chg directly  to the skin and wash                       Gently with a scrungie or clean washcloth.  5.  Apply the CHG Soap to your body ONLY FROM THE NECK DOWN.   Do not use on face/ open                           Wound or open sores. Avoid contact with eyes, ears mouth and genitals (private parts).                       Wash face,  Genitals (private parts) with your normal soap.             6.  Wash thoroughly, paying special attention to the area where your surgery  will be performed.  7.  Thoroughly rinse your body with warm water from the neck down.  8.  DO NOT shower/wash with your normal soap after using and rinsing off  the CHG Soap.                9.  Pat yourself dry with a clean towel.            10.  Wear clean pajamas.            11.  Place clean sheets on your bed the night of your first shower and do not  sleep with pets. Day of Surgery : Do not apply any lotions/deodorants the morning of surgery.  Please wear clean clothes to the hospital/surgery center.  FAILURE TO FOLLOW THESE INSTRUCTIONS MAY RESULT IN THE CANCELLATION OF YOUR SURGERY PATIENT  SIGNATURE_________________________________  NURSE SIGNATURE__________________________________  ________________________________________________________________________

## 2014-09-24 ENCOUNTER — Ambulatory Visit: Payer: Medicare HMO | Admitting: Physician Assistant

## 2014-09-27 ENCOUNTER — Ambulatory Visit (INDEPENDENT_AMBULATORY_CARE_PROVIDER_SITE_OTHER): Payer: Medicare HMO | Admitting: Physician Assistant

## 2014-09-27 ENCOUNTER — Other Ambulatory Visit: Payer: Self-pay | Admitting: *Deleted

## 2014-09-27 ENCOUNTER — Telehealth: Payer: Self-pay | Admitting: Critical Care Medicine

## 2014-09-27 ENCOUNTER — Telehealth (HOSPITAL_COMMUNITY): Payer: Self-pay

## 2014-09-27 ENCOUNTER — Telehealth: Payer: Self-pay | Admitting: *Deleted

## 2014-09-27 DIAGNOSIS — G4733 Obstructive sleep apnea (adult) (pediatric): Secondary | ICD-10-CM

## 2014-09-27 MED ORDER — BACLOFEN 10 MG PO TABS: 10.0000 mg | ORAL_TABLET | Freq: Two times a day (BID) | ORAL | Status: DC

## 2014-09-27 NOTE — H&P (Signed)
Active Problems Problems  1. Benign prostatic hyperplasia with urinary obstruction (N40.1) 2. Erectile dysfunction due to arterial insufficiency (N52.01) 3. Hypogonadism, testicular (E29.1) 4. Klinefelter syndrome (Q98.4) 5. Prostate cancer (C61) 6. Pyuria (N39.0) 7. Testicular atrophy (N50.0) 8. Undescended testicle (Q53.9) 9. Urinary retention (R33.9) 10. Weak urinary stream (R39.12)  History of Present Illness [name] returns today in f/u.  He was to have a cystoscopy and prostate biopsy for his LUTS and history of prostate cancer, but he had acute SOB and went to the ER and was treated with a 2 day hospitalization and had to be cancelled.  He had increased voiding symptoms over the last few weeks with a progressively slower stream and had to have a foley placed on 11/24. He would like the foley out.   Past Medical History Problems  1. History of Anxiety (F41.9) 2. History of Arthritis 3. History of Asthma (J45.909) 4. History of Chronic obstructive pulmonary disease (J44.9) 5. History of Gout 6. History of depression (Z86.59) 7. History of esophageal reflux (Z87.19) 8. History of hypertension (Z86.79) 9. History of kidney stones (Z87.442) 10. History of post-polio syndrome (Z86.12) 11. History of Hypogonadism, testicular (E29.1) 12. History of Klinefelters syndrome (Q98.4) 13. History of Tuberculosis  Surgical History Problems  1. History of Biopsy Of The Prostate Needle 2. History of Cystoscopy (Diagnostic) 3. History of Cystoscopy (For Therapy) 4. History of Leg Repair 5. History of Lithotomy  Current Meds 1. Albuterol 90 MCG/ACT AERS;  Therapy: (Recorded:17May2010) to Recorded 2. ALPRAZolam 0.5 MG Oral Tablet;  Therapy: (Recorded:15Oct2015) to Recorded 3. AmLODIPine Besylate TABS;  Therapy: (Recorded:15Oct2015) to Recorded 4. Breo Ellipta AEPB;  Therapy: (Recorded:15Oct2015) to Recorded 5. Ciprofloxacin HCl TABS;  Therapy: (Recorded:27Oct2015) to  Recorded 6. Losartan Potassium TABS;  Therapy: (Recorded:15Oct2015) to Recorded 7. NexIUM 10 MG Oral Packet;  Therapy: (Recorded:17May2010) to Recorded 8. Nitroglycerin 0.4 MG SUBL;  Therapy: (Recorded:17May2010) to Recorded 9. Omeprazole 40 MG Oral Capsule Delayed Release;  Therapy: (Recorded:15Oct2015) to Recorded 10. Oxycodone-Acetaminophen 5-325 MG Oral Tablet;   Therapy: (Recorded:15Oct2015) to Recorded 11. Oxygen Use;   Therapy: (Recorded:15Oct2015) to Recorded 12. Pepcid TABS;   Therapy: (Recorded:15Oct2015) to Recorded 13. ProAir HFA AERS;   Therapy: (Recorded:15Oct2015) to Recorded 14. Singulair 10 MG Oral Tablet;   Therapy: (Recorded:17May2010) to Recorded 15. Spiriva HandiHaler 18 MCG Inhalation Capsule;   Therapy: (Recorded:17May2010) to Recorded 16. Tamsulosin HCl - 0.4 MG Oral Capsule; TAKE 2 CAPSULE Daily  Requested for:   27Oct2015; Last Rx:27Oct2015 Ordered  Allergies Medication  1. Levofloxacin TABS 2. Penicillins 3. NSAIDs Non-Medication  4. Contrast Dye  Family History Problems  1. Family history of Alzheimer's Disease : Father 2. Family history of Death In The Family Father : Father   Alzheimer'sAge 88 3. Family history of Family Health Status - Mother's Age : Mother   age 79 4. Family history of Skin Cancer : Mother  Social History Problems  1. Alcohol Use   6-12 daily. 2. Denied: History of Caffeine Use 3. Former smoker [phone]) 4. Former tobacco use (Z87.891) 5. Married 6. Occupation:   Maintenance 7. Tobacco Use   Smokes Pipe TobaccoStopped Cigarette Smoking 45 yrs ago  Past and social history reviewed and updated.   Review of Systems  Cardiovascular: no chest pain.  Respiratory: shortness of breath.    Vitals Vital Signs [Data Includes: Last 1 Day]  Recorded: [id number] 01:27PM  Blood Pressure: 150 / 83 Temperature: 97.6 F Heart Rate: 69  Physical Exam Constitutional: Well nourished and well developed .  No acute  distress.  Pulmonary: No respiratory distress and normal respiratory rhythm and effort.  Cardiovascular: Heart rate and rhythm are normal . No peripheral edema.    Results/Data  Flow Rate: Instilled volume 100 ml . Voided 50 ml. A peak flow rate of 7ml/s, mean flow rate of 44ml/s and reduced flow curve .    Procedure His bladder was filled with 170ml and he got an urge. The foley was removed and he voiding some before getting to the flowmeter.     Assessment Assessed  1. Benign prostatic hyperplasia with urinary obstruction (N40.1) 2. Urinary retention (R33.9) 3. Prostate cancer (C61)  [name] had recurrent retention but was able to void today.   Plan Benign prostatic hyperplasia with urinary obstruction  1. Follow-up Schedule Surgery Office  Follow-up  Status: Hold For - Appointment   Requested for: [id number] 2. Fill, Pull, Flow; Status:Complete;   Done: [id number] Urinary retention  3. Start: Ciprofloxacin HCl - 500 MG Oral Tablet; Take 1 tablet twice daily  I discussed the options for his recurrent voiding symptoms and will get him set up again for a prostate [name] and biopsy, but I will also do a TURP to get him opened up and to get additional tissue for pathology.  I reviewed the risks of bleeding, infection, incontinence, stricturing, need for secondary procedures, ejaculatory dysfunction in 90% of patients and erectile dysfunction in 10%, thrombotic events and anesthetic complications.  I will get him cleared by Dr. Joya Gaskins for the surgery.  He was given Cipro to cover the procedure today.   Discussion/Summary CC: Dr. Lyda Jester.

## 2014-09-27 NOTE — Telephone Encounter (Signed)
Called spoke w/ pt. He reports he received a letter from [place] stating he needs a blood gas test done and pulse oximetry for part B medicare regarding his O2. Was told he needs this done by 10/15/14 or the O2 will be d/c'd.  I advised pt we will contact [facility] to see what is needed and going on.  Called [facility] and they are now closed. WCB in AM

## 2014-09-27 NOTE — Telephone Encounter (Signed)
LMOM with contact name and number RE: new Rx request and further provider instructions/SLS

## 2014-09-27 NOTE — Progress Notes (Signed)
Opened in error

## 2014-09-27 NOTE — Telephone Encounter (Signed)
Refill granted. Medication sent to pharmacy -- Baclofen 10 mg.  Take 1 tablet twice daily PRN for muscle spasms.  If no improvement he should come in for visit.

## 2014-09-27 NOTE — Progress Notes (Deleted)
Pre visit review using our clinic review tool, if applicable. No additional management support is needed unless otherwise documented below in the visit note/SLS  

## 2014-09-27 NOTE — Telephone Encounter (Signed)
Spoke with patient regarding needing to contact Pulmonology to set up Pulse Ox/Blood Gas study; patient understood & agreed. Patient requesting new Rx for Baclofen [unsure of 10 and/or 20 mg] that he previously had Rx for muscle spasms in legs; c/o continued spasms/SLS Please Advise on refills.

## 2014-09-27 NOTE — Telephone Encounter (Signed)
I have called and left a message with Hafiz to inquire about participation in Pulmonary Rehab.

## 2014-09-28 ENCOUNTER — Ambulatory Visit (HOSPITAL_COMMUNITY): Payer: Medicare HMO | Admitting: Anesthesiology

## 2014-09-28 ENCOUNTER — Encounter (HOSPITAL_COMMUNITY): Payer: Self-pay | Admitting: *Deleted

## 2014-09-28 ENCOUNTER — Encounter (HOSPITAL_COMMUNITY)
Admission: RE | Disposition: A | Discharge status: Home / Self Care | Payer: Self-pay | Source: Ambulatory Visit | Attending: Urology

## 2014-09-28 ENCOUNTER — Observation Stay (HOSPITAL_COMMUNITY)
Admission: RE | Admit: 2014-09-28 | Discharge: 2014-09-29 | Disposition: A | Discharge status: Home / Self Care | Payer: Medicare HMO | Source: Ambulatory Visit | Attending: Urology | Admitting: Urology

## 2014-09-28 DIAGNOSIS — E119 Type 2 diabetes mellitus without complications: Secondary | ICD-10-CM | POA: Insufficient documentation

## 2014-09-28 DIAGNOSIS — Z880 Allergy status to penicillin: Secondary | ICD-10-CM | POA: Diagnosis not present

## 2014-09-28 DIAGNOSIS — M1990 Unspecified osteoarthritis, unspecified site: Secondary | ICD-10-CM | POA: Insufficient documentation

## 2014-09-28 DIAGNOSIS — Z91041 Radiographic dye allergy status: Secondary | ICD-10-CM | POA: Diagnosis not present

## 2014-09-28 DIAGNOSIS — F329 Major depressive disorder, single episode, unspecified: Secondary | ICD-10-CM | POA: Insufficient documentation

## 2014-09-28 DIAGNOSIS — R339 Retention of urine, unspecified: Secondary | ICD-10-CM | POA: Insufficient documentation

## 2014-09-28 DIAGNOSIS — Z886 Allergy status to analgesic agent status: Secondary | ICD-10-CM | POA: Insufficient documentation

## 2014-09-28 DIAGNOSIS — M109 Gout, unspecified: Secondary | ICD-10-CM | POA: Diagnosis not present

## 2014-09-28 DIAGNOSIS — K219 Gastro-esophageal reflux disease without esophagitis: Secondary | ICD-10-CM | POA: Insufficient documentation

## 2014-09-28 DIAGNOSIS — N138 Other obstructive and reflux uropathy: Secondary | ICD-10-CM | POA: Diagnosis not present

## 2014-09-28 DIAGNOSIS — Z9981 Dependence on supplemental oxygen: Secondary | ICD-10-CM | POA: Diagnosis not present

## 2014-09-28 DIAGNOSIS — Z8546 Personal history of malignant neoplasm of prostate: Secondary | ICD-10-CM | POA: Insufficient documentation

## 2014-09-28 DIAGNOSIS — Q539 Undescended testicle, unspecified: Secondary | ICD-10-CM | POA: Insufficient documentation

## 2014-09-28 DIAGNOSIS — Z79899 Other long term (current) drug therapy: Secondary | ICD-10-CM | POA: Diagnosis not present

## 2014-09-28 DIAGNOSIS — J449 Chronic obstructive pulmonary disease, unspecified: Secondary | ICD-10-CM | POA: Diagnosis not present

## 2014-09-28 DIAGNOSIS — E291 Testicular hypofunction: Secondary | ICD-10-CM | POA: Insufficient documentation

## 2014-09-28 DIAGNOSIS — Z881 Allergy status to other antibiotic agents status: Secondary | ICD-10-CM | POA: Insufficient documentation

## 2014-09-28 DIAGNOSIS — N401 Enlarged prostate with lower urinary tract symptoms: Secondary | ICD-10-CM | POA: Diagnosis not present

## 2014-09-28 DIAGNOSIS — I10 Essential (primary) hypertension: Secondary | ICD-10-CM | POA: Insufficient documentation

## 2014-09-28 DIAGNOSIS — F419 Anxiety disorder, unspecified: Secondary | ICD-10-CM | POA: Insufficient documentation

## 2014-09-28 DIAGNOSIS — Q984 Klinefelter syndrome, unspecified: Secondary | ICD-10-CM | POA: Diagnosis not present

## 2014-09-28 DIAGNOSIS — Z8611 Personal history of tuberculosis: Secondary | ICD-10-CM | POA: Insufficient documentation

## 2014-09-28 DIAGNOSIS — C61 Malignant neoplasm of prostate: Secondary | ICD-10-CM | POA: Diagnosis present

## 2014-09-28 HISTORY — PX: PROSTATE BIOPSY: SHX241

## 2014-09-28 HISTORY — PX: TRANSURETHRAL RESECTION OF PROSTATE: SHX73

## 2014-09-28 LAB — GLUCOSE, CAPILLARY: Glucose-Capillary: 166 mg/dL — ABNORMAL HIGH (ref 70–99)

## 2014-09-28 SURGERY — BIOPSY, PROSTATE
Anesthesia: General

## 2014-09-28 MED ORDER — BISACODYL 10 MG RE SUPP: 10.0000 mg | Freq: Every day | RECTAL | Status: DC | PRN

## 2014-09-28 MED ORDER — METOCLOPRAMIDE HCL 5 MG/ML IJ SOLN
INTRAMUSCULAR | Status: DC | PRN
  Administered 2014-09-28: 10 mg via INTRAVENOUS

## 2014-09-28 MED ORDER — PROPOFOL 10 MG/ML IV BOLUS
INTRAVENOUS | Status: AC
Start: 2014-09-28 — End: 2014-09-28
  Filled 2014-09-28: qty 20

## 2014-09-28 MED ORDER — SODIUM CHLORIDE 0.9 % IV SOLN
1000.0000 mg | Freq: Once | INTRAVENOUS | Status: DC
  Filled 2014-09-28: qty 1000

## 2014-09-28 MED ORDER — MIDAZOLAM HCL 2 MG/2ML IJ SOLN
INTRAMUSCULAR | Status: AC
  Filled 2014-09-28: qty 2

## 2014-09-28 MED ORDER — FLUTICASONE FUROATE-VILANTEROL 100-25 MCG/INH IN AEPB: 1.0000 | INHALATION_SPRAY | Freq: Every day | RESPIRATORY_TRACT | Status: DC

## 2014-09-28 MED ORDER — VANCOMYCIN HCL 10 G IV SOLR: 1000.0000 mg | Freq: Once | INTRAVENOUS | Status: DC

## 2014-09-28 MED ORDER — MONTELUKAST SODIUM 10 MG PO TABS
10.0000 mg | ORAL_TABLET | Freq: Every day | ORAL | Status: DC
  Administered 2014-09-28: 10 mg via ORAL
  Filled 2014-09-28 (×2): qty 1

## 2014-09-28 MED ORDER — ONDANSETRON HCL 4 MG/2ML IJ SOLN
INTRAMUSCULAR | Status: AC
  Filled 2014-09-28: qty 2

## 2014-09-28 MED ORDER — BACLOFEN 10 MG PO TABS
10.0000 mg | ORAL_TABLET | Freq: Two times a day (BID) | ORAL | Status: DC
  Administered 2014-09-28 – 2014-09-29 (×2): 10 mg via ORAL
  Filled 2014-09-28 (×3): qty 1

## 2014-09-28 MED ORDER — MIDAZOLAM HCL 5 MG/5ML IJ SOLN
INTRAMUSCULAR | Status: DC | PRN
  Administered 2014-09-28: 2 mg via INTRAVENOUS

## 2014-09-28 MED ORDER — ONDANSETRON HCL 4 MG/2ML IJ SOLN
INTRAMUSCULAR | Status: DC | PRN
  Administered 2014-09-28: 4 mg via INTRAVENOUS

## 2014-09-28 MED ORDER — GLYCINE 1.5 % IR SOLN: Status: DC | PRN

## 2014-09-28 MED ORDER — VANCOMYCIN HCL IN DEXTROSE 1-5 GM/200ML-% IV SOLN
INTRAVENOUS | Status: AC
  Filled 2014-09-28: qty 200

## 2014-09-28 MED ORDER — ZOLPIDEM TARTRATE 5 MG PO TABS
5.0000 mg | ORAL_TABLET | Freq: Every evening | ORAL | Status: DC | PRN
  Administered 2014-09-28: 5 mg via ORAL
  Filled 2014-09-28: qty 1

## 2014-09-28 MED ORDER — LIDOCAINE HCL (CARDIAC) 20 MG/ML IV SOLN
INTRAVENOUS | Status: AC
  Filled 2014-09-28: qty 5

## 2014-09-28 MED ORDER — FAMOTIDINE 20 MG PO TABS
20.0000 mg | ORAL_TABLET | Freq: Every day | ORAL | Status: DC
  Administered 2014-09-28: 20 mg via ORAL
  Filled 2014-09-28 (×2): qty 1

## 2014-09-28 MED ORDER — LORATADINE 10 MG PO TABS
10.0000 mg | ORAL_TABLET | Freq: Every day | ORAL | Status: DC | PRN
  Filled 2014-09-28: qty 1

## 2014-09-28 MED ORDER — OXYCODONE-ACETAMINOPHEN 10-325 MG PO TABS
1.0000 | ORAL_TABLET | Freq: Three times a day (TID) | ORAL | Status: DC | PRN
Start: 2014-09-28 — End: 2014-09-28

## 2014-09-28 MED ORDER — DOCUSATE SODIUM 100 MG PO CAPS
100.0000 mg | ORAL_CAPSULE | Freq: Two times a day (BID) | ORAL | Status: DC
  Administered 2014-09-28 – 2014-09-29 (×2): 100 mg via ORAL
  Filled 2014-09-28 (×3): qty 1

## 2014-09-28 MED ORDER — FENTANYL CITRATE 0.05 MG/ML IJ SOLN
25.0000 ug | INTRAMUSCULAR | Status: DC | PRN
  Administered 2014-09-28 (×2): 25 ug via INTRAVENOUS

## 2014-09-28 MED ORDER — GENTAMICIN SULFATE 40 MG/ML IJ SOLN
2.0000 mg/kg | Freq: Once | INTRAVENOUS | Status: AC
  Administered 2014-09-28: 200 mg via INTRAVENOUS
  Filled 2014-09-28: qty 5

## 2014-09-28 MED ORDER — SODIUM CHLORIDE 0.9 % IR SOLN
Status: DC | PRN
  Administered 2014-09-28: 9000 mL

## 2014-09-28 MED ORDER — OXYCODONE-ACETAMINOPHEN 5-325 MG PO TABS
1.0000 | ORAL_TABLET | Freq: Three times a day (TID) | ORAL | Status: DC | PRN
  Administered 2014-09-28 – 2014-09-29 (×2): 1 via ORAL
  Filled 2014-09-28 (×2): qty 1

## 2014-09-28 MED ORDER — GENTAMICIN SULFATE 40 MG/ML IJ SOLN: 2.0000 mg/kg | Freq: Once | INTRAVENOUS | Status: DC

## 2014-09-28 MED ORDER — AMLODIPINE BESYLATE 10 MG PO TABS
10.0000 mg | ORAL_TABLET | Freq: Every morning | ORAL | Status: DC
  Administered 2014-09-29: 10 mg via ORAL
  Filled 2014-09-28: qty 1

## 2014-09-28 MED ORDER — METOCLOPRAMIDE HCL 5 MG/ML IJ SOLN
INTRAMUSCULAR | Status: AC
  Filled 2014-09-28: qty 2

## 2014-09-28 MED ORDER — METOPROLOL SUCCINATE ER 25 MG PO TB24
25.0000 mg | ORAL_TABLET | Freq: Every morning | ORAL | Status: DC
Start: 2014-09-29 — End: 2014-09-29
  Administered 2014-09-29: 25 mg via ORAL
  Filled 2014-09-28: qty 1

## 2014-09-28 MED ORDER — FENTANYL CITRATE 0.05 MG/ML IJ SOLN
INTRAMUSCULAR | Status: AC
  Administered 2014-09-28: 25 ug via INTRAVENOUS
  Filled 2014-09-28: qty 2

## 2014-09-28 MED ORDER — PANTOPRAZOLE SODIUM 40 MG PO TBEC
40.0000 mg | DELAYED_RELEASE_TABLET | Freq: Every day | ORAL | Status: DC
  Administered 2014-09-29: 40 mg via ORAL
  Filled 2014-09-28: qty 1

## 2014-09-28 MED ORDER — PROPOFOL 10 MG/ML IV BOLUS
INTRAVENOUS | Status: AC
  Filled 2014-09-28: qty 20

## 2014-09-28 MED ORDER — GENTAMICIN SULFATE 40 MG/ML IJ SOLN
2.0000 mg/kg | Freq: Once | INTRAMUSCULAR | Status: AC
  Administered 2014-09-28: 200 mg via INTRAVENOUS
  Filled 2014-09-28: qty 5

## 2014-09-28 MED ORDER — ALBUTEROL SULFATE (2.5 MG/3ML) 0.083% IN NEBU
3.0000 mL | INHALATION_SOLUTION | Freq: Four times a day (QID) | RESPIRATORY_TRACT | Status: DC | PRN
  Administered 2014-09-28: 3 mL via RESPIRATORY_TRACT
  Filled 2014-09-28: qty 3

## 2014-09-28 MED ORDER — PROMETHAZINE HCL 25 MG PO TABS: 12.5000 mg | ORAL_TABLET | Freq: Three times a day (TID) | ORAL | Status: DC | PRN

## 2014-09-28 MED ORDER — OXYCODONE HCL 5 MG PO TABS
5.0000 mg | ORAL_TABLET | Freq: Three times a day (TID) | ORAL | Status: DC | PRN
  Administered 2014-09-28 – 2014-09-29 (×2): 5 mg via ORAL
  Filled 2014-09-28 (×2): qty 1

## 2014-09-28 MED ORDER — EPHEDRINE SULFATE 50 MG/ML IJ SOLN
INTRAMUSCULAR | Status: DC | PRN
  Administered 2014-09-28 (×3): 5 mg via INTRAVENOUS
  Administered 2014-09-28: 10 mg via INTRAVENOUS

## 2014-09-28 MED ORDER — KCL IN DEXTROSE-NACL 20-5-0.45 MEQ/L-%-% IV SOLN
INTRAVENOUS | Status: DC
  Administered 2014-09-28 – 2014-09-29 (×2): via INTRAVENOUS
  Filled 2014-09-28 (×3): qty 1000

## 2014-09-28 MED ORDER — ONDANSETRON HCL 4 MG/2ML IJ SOLN: 4.0000 mg | Freq: Once | INTRAMUSCULAR | Status: DC | PRN

## 2014-09-28 MED ORDER — DEXAMETHASONE SODIUM PHOSPHATE 10 MG/ML IJ SOLN
INTRAMUSCULAR | Status: AC
  Filled 2014-09-28: qty 1

## 2014-09-28 MED ORDER — LIDOCAINE HCL (CARDIAC) 20 MG/ML IV SOLN
INTRAVENOUS | Status: DC | PRN
  Administered 2014-09-28: 50 mg via INTRAVENOUS

## 2014-09-28 MED ORDER — HYDROMORPHONE HCL 1 MG/ML IJ SOLN
0.5000 mg | INTRAMUSCULAR | Status: DC | PRN
  Administered 2014-09-28: 1 mg via INTRAVENOUS
  Filled 2014-09-28: qty 1

## 2014-09-28 MED ORDER — DEXAMETHASONE SODIUM PHOSPHATE 4 MG/ML IJ SOLN
INTRAMUSCULAR | Status: DC | PRN
  Administered 2014-09-28: 10 mg via INTRAVENOUS

## 2014-09-28 MED ORDER — ALPRAZOLAM 1 MG PO TABS
1.0000 mg | ORAL_TABLET | Freq: Three times a day (TID) | ORAL | Status: DC | PRN
  Administered 2014-09-28: 1 mg via ORAL
  Filled 2014-09-28: qty 1

## 2014-09-28 MED ORDER — FENTANYL CITRATE 0.05 MG/ML IJ SOLN
INTRAMUSCULAR | Status: DC | PRN
  Administered 2014-09-28: 50 ug via INTRAVENOUS
  Administered 2014-09-28 (×2): 25 ug via INTRAVENOUS

## 2014-09-28 MED ORDER — EPHEDRINE SULFATE 50 MG/ML IJ SOLN
INTRAMUSCULAR | Status: AC
  Filled 2014-09-28: qty 1

## 2014-09-28 MED ORDER — FLUTICASONE PROPIONATE 50 MCG/ACT NA SUSP: 2.0000 | Freq: Every day | NASAL | Status: DC | PRN

## 2014-09-28 MED ORDER — TIOTROPIUM BROMIDE MONOHYDRATE 18 MCG IN CAPS
18.0000 ug | ORAL_CAPSULE | Freq: Every day | RESPIRATORY_TRACT | Status: DC
  Administered 2014-09-29: 18 ug via RESPIRATORY_TRACT
  Filled 2014-09-28: qty 5

## 2014-09-28 MED ORDER — ALBUTEROL SULFATE (2.5 MG/3ML) 0.083% IN NEBU: 2.5000 mg | INHALATION_SOLUTION | Freq: Four times a day (QID) | RESPIRATORY_TRACT | Status: DC | PRN

## 2014-09-28 MED ORDER — VANCOMYCIN HCL IN DEXTROSE 1-5 GM/200ML-% IV SOLN
1000.0000 mg | Freq: Once | INTRAVENOUS | Status: AC
  Administered 2014-09-28: 1000 mg via INTRAVENOUS
  Filled 2014-09-28: qty 200

## 2014-09-28 MED ORDER — FENTANYL CITRATE 0.05 MG/ML IJ SOLN
INTRAMUSCULAR | Status: AC
  Filled 2014-09-28: qty 5

## 2014-09-28 MED ORDER — LACTATED RINGERS IV SOLN
INTRAVENOUS | Status: DC
  Administered 2014-09-28 (×2): 1000 mL via INTRAVENOUS

## 2014-09-28 MED ORDER — PROPOFOL 10 MG/ML IV BOLUS
INTRAVENOUS | Status: DC | PRN
  Administered 2014-09-28: 200 mg via INTRAVENOUS

## 2014-09-28 MED ORDER — OXYBUTYNIN CHLORIDE 5 MG PO TABS
5.0000 mg | ORAL_TABLET | Freq: Three times a day (TID) | ORAL | Status: DC | PRN
  Administered 2014-09-29: 5 mg via ORAL
  Filled 2014-09-28 (×2): qty 1

## 2014-09-28 MED ORDER — ACETAMINOPHEN 325 MG PO TABS: 650.0000 mg | ORAL_TABLET | ORAL | Status: DC | PRN

## 2014-09-28 SURGICAL SUPPLY — 23 items
BAG URINE DRAINAGE (UROLOGICAL SUPPLIES) ×2 IMPLANT
BAG URO CATCHER STRL LF (DRAPE) ×2 IMPLANT
BLADE SURG 15 STRL LF DISP TIS (BLADE) IMPLANT
BLADE SURG 15 STRL SS (BLADE)
CATH FOLEY 2WAY SLVR 30CC 22FR (CATHETERS) ×2 IMPLANT
CATH FOLEY 3WAY 30CC 22FR (CATHETERS) ×2 IMPLANT
CATH URET 5FR 28IN OPEN ENDED (CATHETERS) IMPLANT
ELECT LOOP MED HF 24F 12D CBL (CLIP) ×2 IMPLANT
ELECT REM PT RETURN 9FT ADLT (ELECTROSURGICAL) ×2
ELECTRODE REM PT RTRN 9FT ADLT (ELECTROSURGICAL) ×1 IMPLANT
GLOVE SURG SS PI 8.0 STRL IVOR (GLOVE) IMPLANT
GOWN STRL REUS W/TWL XL LVL3 (GOWN DISPOSABLE) ×2 IMPLANT
HOLDER FOLEY CATH W/STRAP (MISCELLANEOUS) ×2 IMPLANT
KIT ASPIRATION TUBING (SET/KITS/TRAYS/PACK) ×2 IMPLANT
LOOPS RESECTOSCOPE DISP (ELECTROSURGICAL) ×2 IMPLANT
MANIFOLD NEPTUNE II (INSTRUMENTS) ×2 IMPLANT
PACK CYSTO (CUSTOM PROCEDURE TRAY) ×2 IMPLANT
SUT ETHILON 3 0 PS 1 (SUTURE) IMPLANT
SYR 30ML LL (SYRINGE) IMPLANT
SYR CONTROL 10ML LL (SYRINGE) ×2 IMPLANT
SYRINGE IRR TOOMEY STRL 70CC (SYRINGE) IMPLANT
TUBING CONNECTING 10 (TUBING) ×2 IMPLANT
UNDERPAD 30X30 INCONTINENT (UNDERPADS AND DIAPERS) ×2 IMPLANT

## 2014-09-28 NOTE — Brief Op Note (Signed)
09/28/2014  2:32 PM  PATIENT:  [name]  63 y.o. male  PRE-OPERATIVE DIAGNOSIS:  BENIGN PROSTATIC HYPERPLASIA WITH BLADDER OUTLET OBSTRUCTION/PROSTATE CANCER  POST-OPERATIVE DIAGNOSIS:  BENIGN PROSTATIC HYPERPLASIA WITH BLADDER/PROSTATE CANCER  PROCEDURE:  Procedure(s): PROSTATE ULTRASOUND/BIOPSY (N/A) TRANSURETHRAL RESECTION OF THE PROSTATE (TURP) (N/A)  SURGEON:  Surgeon(s) and Role:    * [name], MD - Primary  PHYSICIAN ASSISTANT:   ASSISTANTS: none   ANESTHESIA:   general  EBL:     BLOOD ADMINISTERED:none  DRAINS: Urinary Catheter (Foley)   LOCAL MEDICATIONS USED:  NONE  SPECIMEN:  Source of Specimen:  12 core prostate biopsy and TUR chips.  DISPOSITION OF SPECIMEN:  PATHOLOGY  COUNTS:  YES  TOURNIQUET:  * No tourniquets in log *  DICTATION: .Other Dictation: Dictation Number [id number]  PLAN OF CARE: Admit for overnight observation  PATIENT DISPOSITION:  PACU - hemodynamically stable.   Delay start of Pharmacological VTE agent (>24hrs) due to surgical blood loss or risk of bleeding: yes

## 2014-09-28 NOTE — Telephone Encounter (Signed)
Spoke with Helana with [employer].  Pt is changing to traditional Medicare effective Oct 15, 2014.  They will need OV notes discussing o2 and will need qualifying sats. Per Helana, they have pt only using o2 qhs, so only ONO is needed; no ABG is needed and  Results will need to be within 30 days of pt's insurance change.                                        Dr. Joya Gaskins,  1.  Please advise if you are ok with ONO on RA to qualify pt for o2 for Medicare.   2.  Also, will you please make an addendum to Sep 16, 2014 OV note to mention o2.  Per Helana, once ONO results are received, another addendum will need to be done to mention results and plan.

## 2014-09-28 NOTE — Progress Notes (Signed)
ANTIBIOTIC CONSULT NOTE - INITIAL  Pharmacy Consult for Gentamicin Indication: Surgical prophylaxis   Allergies  Allergen Reactions  . Ivp Dye [Iodinated Diagnostic Agents] Anaphylaxis  . Levaquin [Levofloxacin In D5w] Shortness Of Breath    In addition: sweating, chest pain, and diarrhea.   . Penicillins Anaphylaxis    Heart stops  . Aspirin Other (See Comments)    Reaction unknown  . Morphine And Related Nausea And Vomiting  . Nsaids Other (See Comments)    Difficulty breathing    Patient Measurements: Height: 6\' 1"  (185.4 cm) Weight: 216 lb (97.977 kg) IBW/kg (Calculated) : 79.9 Adjusted Body Weight: 87 kg  Vital Signs: Temp: 97.6 F (36.4 C) (12/15 1544) Temp Source: Oral (12/15 1049) BP: 123/65 mmHg (12/15 1544) Pulse Rate: 66 (12/15 1544) Intake/Output from previous day:   Intake/Output from this shift: Total I/O In: 1400 [I.V.:1100; Other:300] Out: 300 [Urine:300]  Labs: No results for input(s): WBC, HGB, PLT, LABCREA, CREATININE in the last 72 hours. Estimated Creatinine Clearance: 71.8 mL/min (by C-G formula based on Cr of 1.28). No results for input(s): VANCOTROUGH, VANCOPEAK, VANCORANDOM, GENTTROUGH, GENTPEAK, GENTRANDOM, TOBRATROUGH, TOBRAPEAK, TOBRARND, AMIKACINPEAK, AMIKACINTROU, AMIKACIN in the last 72 hours.   Microbiology: No results found for this or any previous visit (from the past 720 hour(s)).  Medical History: Past Medical History  Diagnosis Date  . Hypertension   . Prostate cancer   . GERD (gastroesophageal reflux disease)   . History of TB (tuberculosis)     1984--  hospitalized for 4 month treatment  . Post-polio syndrome     polio at age 48--PT WAS IN IRON LUNG; PT WAS IN W/C UNTIL AGE 58; STILL HAS WEAKNESS RIGHT SIDE  . History of chronic bronchitis   . History of rheumatic fever   . COPD, frequent exacerbations     pulmologist-  dr Joya Gaskins--  Gold Stage C  . Bladder outlet obstruction   . Anxiety disorder   . History of urinary  retention   . Nocturnal oxygen desaturation     USES O2 NIGHTLY  . Prostate cancer   . Complication of anesthesia     DIFFICULT WAKING   . Diabetes mellitus without complication     BODERLINE - DIET CONTROL  . Anxiety   . Shortness of breath dyspnea     RIGHT HEMIDIAPHRAGM ELEVATION - POST POLIO SYNDROME  . Dysrhythmia     PVC'S  . Arthritis     BILATERAL SHOULDERS, ELBOWS AND HANDS AND LEFT HIP AND KNEES--HX CORTISONE SHOTS IN SHOULDERS, ELBOWS, HIP AND KNEES    Medications:  Scheduled:  . [START ON 09/29/2014] amLODipine  10 mg Oral q morning - 10a  . baclofen  10 mg Oral BID  . docusate sodium  100 mg Oral BID  . famotidine  20 mg Oral QAC supper  . Fluticasone Furoate-Vilanterol  1 puff Inhalation Daily  . [START ON 09/29/2014] metoprolol succinate  25 mg Oral q morning - 10a  . montelukast  10 mg Oral QHS  . [START ON 09/29/2014] pantoprazole  40 mg Oral Daily  . [START ON 09/29/2014] tiotropium  18 mcg Inhalation Daily   Infusions:  . dextrose 5 % and 0.45 % NaCl with KCl 20 mEq/L     Assessment:  63 yr male with BPH with urinary obstruction and h/o prostate cancer.  S/P TURP and prostate biopsy today - AET 14:44  Pharmacy consulted to provide Gentamicin dosing to provide 24 hr surgical prophylactic coverage  Dosing of Gentamicin  for urologic prophylaxis ~ 5 mg/kg/dose x 1 dose to provide 24 hr coverage  Patient received 200mg  Gentamicin x 1 @ 13:46 (approx 2.3 mg/kg); therefore will give a repeat dose to equal close to 5mg /kg total dose  Goal of Therapy:  Surgical prophylaxis x 24 hr coverage  Plan:   Give additional Gentamicin 200mg  IV x 1 now (for total dose of Gentamicin of approx 5 mg/kg)  This will provide 24 hr surgical prophylaxis coverage  Emmalynn Pinkham, Toribio Harbour, PharmD 09/28/2014,4:08 PM

## 2014-09-28 NOTE — Interval H&P Note (Signed)
History and Physical Interval Note:  09/28/2014 1:28 PM  [name]  has presented today for surgery, with the diagnosis of BENIGN PROSTATIC HYPERPLASIA WITH BLADDER OUTLET OBSTRUCTION/PROSTATE CANCER  The various methods of treatment have been discussed with the patient and family. After consideration of risks, benefits and other options for treatment, the patient has consented to  Procedure(s): PROSTATE ULTRASOUND/BIOPSY (N/A) [place] (TURP) (N/A) as a surgical intervention .  The patient's history has been reviewed, patient examined, no change in status, stable for surgery.  I have reviewed the patient's chart and labs.  Questions were answered to the patient's satisfaction.     Kawan Valladolid J

## 2014-09-28 NOTE — Anesthesia Postprocedure Evaluation (Signed)
  Anesthesia Post-op Note  Patient: [name]  Procedure(s) Performed: Procedure(s) (LRB): PROSTATE ULTRASOUND/BIOPSY (N/A) TRANSURETHRAL RESECTION OF THE PROSTATE (TURP) (N/A)  Patient Location: PACU  Anesthesia Type: General  Level of Consciousness: awake and alert   Airway and Oxygen Therapy: Patient Spontanous Breathing  Post-op Pain: mild  Post-op Assessment: Post-op Vital signs reviewed, Patient's Cardiovascular Status Stable, Respiratory Function Stable, Patent Airway and No signs of Nausea or vomiting  Last Vitals:  Filed Vitals:   09/28/14 1527  BP: 113/62  Pulse: 69  Temp: 36.3 C  Resp: 14    Post-op Vital Signs: stable   Complications: No apparent anesthesia complications

## 2014-09-28 NOTE — Discharge Instructions (Signed)
Transurethral Resection of the Prostate °Care After °Refer to this sheet in the next few weeks. These instructions provide you with information on caring for yourself after your procedure. Your caregiver also may give you specific instructions. Your treatment has been planned according to current medical practices, but complications sometimes occur. Call your caregiver if you have any problems or questions after your procedure. °HOME CARE INSTRUCTIONS  °Recovery can take 4-6 weeks. Avoid alcohol, caffeinated drinks, and spicy foods for 2 weeks after your procedure. Drink enough fluids to keep your urine clear or pale yellow. Urinate as soon as you feel the urge to do so. Do not try to hold your urine for long periods of time. °During recovery you may experience pain caused by bladder spasms, which result in a very intense urge to urinate. Take all medicines as directed by your caregiver, including medicines for pain. Try to limit the amount of pain medicines you take because it can cause constipation. If you do become constipated, do not strain to move your bowels. Straining can increase bleeding. Constipation can be minimized by increasing the amount fluids and fiber in your diet. Your caregiver also may prescribe a stool softener. °Do not lift heavy objects (more than 5 lb [2.25 kg]) or perform exercises that cause you to strain for at least 1 month after your procedure. When sitting, you may want to sit in a soft chair or use a cushion. For the first 10 days after your procedure, avoid the following activities: °· Running. °· Strenuous work. °· Long walks. °· Riding in a car for extended periods. °· Sex. °SEEK MEDICAL CARE IF: °· You have difficulty urinating. °· You have blood in your urine that does not go away after you rest or increase your fluid intake. °· You have swelling in your penis or scrotum. °SEEK IMMEDIATE MEDICAL CARE IF:  °· You are suddenly unable to urinate. °· You notice blood clots in your  urine. °· You have chills. °· You have a fever. °· You have pain in your back or lower abdomen. °· You have pain or swelling in your legs. °MAKE SURE YOU:  °· Understand these instructions. °· Will watch your condition. °· Will get help right away if you are not doing well or get worse. °Document Released: 10/01/2005 Document Revised: 06/25/2012 Document Reviewed: 11/09/2011 °ExitCare® Patient Information ©2015 [employer]. This information is not intended to replace advice given to you by your health care provider. Make sure you discuss any questions you have with your health care provider. ° °

## 2014-09-28 NOTE — Telephone Encounter (Signed)
i prefer to make a new note once ONO is received.  pls ordere ONO

## 2014-09-28 NOTE — Transfer of Care (Signed)
Immediate Anesthesia Transfer of Care Note  Patient: [name]  Procedure(s) Performed: Procedure(s): PROSTATE ULTRASOUND/BIOPSY (N/A) TRANSURETHRAL RESECTION OF THE PROSTATE (TURP) (N/A)  Patient Location: PACU  Anesthesia Type:General  Level of Consciousness: Patient easily awoken, sedated, comfortable, cooperative, following commands, responds to stimulation.   Airway & Oxygen Therapy: Patient spontaneously breathing, ventilating well, oxygen via simple oxygen mask.  Post-op Assessment: Report given to PACU RN, vital signs reviewed and stable, moving all extremities.   Post vital signs: Reviewed and stable.  Complications: No apparent anesthesia complications

## 2014-09-29 ENCOUNTER — Encounter (HOSPITAL_COMMUNITY): Payer: Self-pay | Admitting: Urology

## 2014-09-29 DIAGNOSIS — N138 Other obstructive and reflux uropathy: Secondary | ICD-10-CM | POA: Diagnosis present

## 2014-09-29 DIAGNOSIS — N401 Enlarged prostate with lower urinary tract symptoms: Secondary | ICD-10-CM

## 2014-09-29 NOTE — Telephone Encounter (Signed)
Spoke with Guy Begin at [facility] to make her aware of what we are doing with pt.  ONO ordered on ra.  Nothing further needed.

## 2014-09-29 NOTE — Discharge Summary (Signed)
Physician Discharge Summary  Patient ID: [name] MRN: [id number] DOB/AGE: 03-04-1951 63 y.o.  Admit date: 09/28/2014 Discharge date: 09/29/2014  Admission Diagnoses:  Benign localized hyperplasia of prostate with urinary obstruction  Discharge Diagnoses:  Principal Problem:   Benign localized hyperplasia of prostate with urinary obstruction Active Problems:   Prostate cancer   Past Medical History  Diagnosis Date  . Hypertension   . Prostate cancer   . GERD (gastroesophageal reflux disease)   . History of TB (tuberculosis)     1984--  hospitalized for 4 month treatment  . Post-polio syndrome     polio at age 63--PT WAS IN IRON LUNG; PT WAS IN W/C UNTIL AGE 63; STILL HAS WEAKNESS RIGHT SIDE  . History of chronic bronchitis   . History of rheumatic fever   . COPD, frequent exacerbations     pulmologist-  dr Joya Gaskins--  Gold Stage C  . Bladder outlet obstruction   . Anxiety disorder   . History of urinary retention   . Nocturnal oxygen desaturation     USES O2 NIGHTLY  . Prostate cancer   . Complication of anesthesia     DIFFICULT WAKING   . Diabetes mellitus without complication     BODERLINE - DIET CONTROL  . Anxiety   . Shortness of breath dyspnea     RIGHT HEMIDIAPHRAGM ELEVATION - POST POLIO SYNDROME  . Dysrhythmia     PVC'S  . Arthritis     BILATERAL SHOULDERS, ELBOWS AND HANDS AND LEFT HIP AND KNEES--HX CORTISONE SHOTS IN SHOULDERS, ELBOWS, HIP AND KNEES    Surgeries: Procedure(s): PROSTATE ULTRASOUND/BIOPSY TRANSURETHRAL RESECTION OF THE PROSTATE (TURP) on 09/28/2014   Consultants (if any):    Discharged Condition: Improved  Hospital Course: JEHU MCCAUSLIN is an 63 y.o. male who was admitted 09/28/2014 with a diagnosis of Benign localized hyperplasia of prostate with urinary obstruction and went to the operating room on 09/28/2014 and underwent the above named procedures.  His urine is clear this morning.  The foley and IV were removed and he  will be discharged home when voiding.    He was given perioperative antibiotics:      Anti-infectives    Start     Dose/Rate Route Frequency Ordered Stop   09/28/14 1630  gentamicin (GARAMYCIN) 200 mg in dextrose 5 % 50 mL IVPB     2 mg/kg  98.9 kg110 mL/hr over 30 Minutes Intravenous  Once 09/28/14 1619 09/28/14 1751   09/28/14 1600  gentamicin (GARAMYCIN) 200 mg in dextrose 5 % 50 mL IVPB  Status:  Discontinued     2 mg/kg  98.9 kg110 mL/hr over 30 Minutes Intravenous  Once 09/28/14 1551 09/28/14 1602   09/28/14 1315  vancomycin (VANCOCIN) IVPB 1000 mg/200 mL premix     1,000 mg200 mL/hr over 60 Minutes Intravenous  Once 09/28/14 1310 09/28/14 1316    .  He was given sequential compression devices for DVT prophylaxis.  He benefited maximally from the hospital stay and there were no complications.    Recent vital signs:  Filed Vitals:   09/29/14 0449  BP: 112/60  Pulse: 62  Temp: 97.7 F (36.5 C)  Resp: 16    Recent laboratory studies:  Lab Results  Component Value Date   HGB 13.7 09/23/2014   HGB 12.4* 09/01/2014   HGB 12.7* 08/18/2014   Lab Results  Component Value Date   WBC 6.4 09/23/2014   PLT 268 09/23/2014   No results found for:  INR Lab Results  Component Value Date   NA 140 09/23/2014   K 4.5 09/23/2014   CL 103 09/23/2014   CO2 25 09/23/2014   BUN 12 09/23/2014   CREATININE 1.28 09/23/2014   GLUCOSE 97 09/23/2014    Discharge Medications:     Medication List    TAKE these medications        albuterol (2.5 MG/3ML) 0.083% nebulizer solution  Commonly known as:  PROVENTIL  Take 2.5 mg by nebulization every 6 (six) hours as needed for wheezing or shortness of breath. Pt uses 2-3X daily     albuterol 108 (90 BASE) MCG/ACT inhaler  Commonly known as:  PROVENTIL HFA;VENTOLIN HFA  Inhale 1-2 puffs into the lungs every 6 (six) hours as needed for wheezing or shortness of breath. PROAIR     ALPRAZolam 1 MG tablet  Commonly known as:  XANAX   Take 1 tablet (1 mg total) by mouth 3 (three) times daily as needed for anxiety.     amLODipine 10 MG tablet  Commonly known as:  NORVASC  Take 10 mg by mouth every morning.     baclofen 10 MG tablet  Commonly known as:  LIORESAL  Take 1 tablet (10 mg total) by mouth 2 (two) times daily.     famotidine 20 MG tablet  Commonly known as:  PEPCID  Take 20 mg by mouth daily. TAKES AT 4 PM DAILY     fluticasone 50 MCG/ACT nasal spray  Commonly known as:  FLONASE  Place 2 sprays into both nostrils daily.     Fluticasone Furoate-Vilanterol 100-25 MCG/INH Aepb  Inhale 1 puff into the lungs daily. BREO ELLIPTA     loratadine 10 MG tablet  Commonly known as:  CLARITIN  Take 10 mg by mouth daily as needed for allergies.     metoprolol succinate 25 MG 24 hr tablet  Commonly known as:  TOPROL-XL  Take 1 tablet (25 mg total) by mouth daily.     montelukast 10 MG tablet  Commonly known as:  SINGULAIR  Take 10 mg by mouth at bedtime.     omeprazole 20 MG capsule  Commonly known as:  PRILOSEC  Take 1 capsule (20 mg total) by mouth daily.     oxyCODONE-acetaminophen 10-325 MG per tablet  Commonly known as:  PERCOCET  Take 1 tablet by mouth every 8 (eight) hours as needed for pain.     promethazine 12.5 MG tablet  Commonly known as:  PHENERGAN  Take 1 tablet (12.5 mg total) by mouth every 8 (eight) hours as needed for nausea or vomiting.     tiotropium 18 MCG inhalation capsule  Commonly known as:  SPIRIVA  Place 18 mcg into inhaler and inhale daily. IN AM        Diagnostic Studies: No results found.  Disposition: 01-Home or Self Care    Follow-up Information    Follow up with [name], MD On 10/06/2014.   Specialty:  Urology   Why:  4   Contact information:   [place] [address] [phone]        Signed: [name] 09/29/2014, 6:54 AM

## 2014-09-29 NOTE — Op Note (Signed)
NAMEHRIDHAAN, YOHN              ACCOUNT NO.:  [id number]  MEDICAL RECORD NO.:  [id number]  LOCATION:  5409                         FACILITY:  [facility]  PHYSICIAN:  Marshall Cork. Jeffie Pollock, M.D.    DATE OF BIRTH:  [birth year]  DATE OF PROCEDURE:  09/28/2014 DATE OF DISCHARGE:                              OPERATIVE REPORT   PROCEDURE: 1. Transrectal ultrasound of the prostate with ultrasound-guided     prostate biopsy. 2. Transurethral resection of the prostate.  PREOPERATIVE DIAGNOSIS:  Prostate cancer and benign prostatic hypertrophy with bladder outlet obstruction.  POSTOPERATIVE DIAGNOSIS:  Prostate cancer and benign prostatic hypertrophy with bladder outlet obstruction.  SURGEON:  Marshall Cork. Jeffie Pollock, M.D.  ANESTHESIA:  General.  SPECIMEN:  Twelve core prostate biopsy and TUR chips.  DRAINS:  A 22-French 3-way Foley catheter.  BLOOD LOSS:  Minimal.  COMPLICATIONS:  None.  INDICATIONS:  Mr. Miyoshi is a 63 year old white male with a history of prostate cancer and BPH with bladder outlet obstruction.  He is not felt to be a candidate for radical prostatectomy, but his voiding symptoms are too severe for consideration of radiation therapy at this point.  He has not had a biopsy in some time, so he is to undergo a repeat prostate biopsy today along with channel TUR to improve his voiding symptoms.  FINDINGS OF PROCEDURE:  He was given gentamicin and vancomycin per pharmacy preoperatively.  He was taken to the operating room where general anesthetic was induced.  He was placed in lithotomy position and fitted with PAS hose.  Prior to prepping and draping, the ultrasound was performed using a 10 megahertz transrectal ultrasound probe.  This inspection revealed normal appearing seminal vesicles.  The prostate was 46.6 mL in size with a length of 4.69 cm, a height of 3.91 cm, and a width of 4.85 cm.  No obvious hypoechoic lesions were identified.  The transitional zone was unremarkable but  did extend into the base of the bladder. The seminal vesicles were unremarkable.  A left bladder wall diverticulum was identified during the ultrasound.  Once the ultrasound inspection had been completed, a 12 core pattern biopsy was taken in the usual configuration and the specimens were individually sent to Pathology.  Once the biopsy was completed, the patient was prepped with Betadine solution and draped in usual sterile fashion.  His urethra was calibrated to 30-French with Leander Rams sounds due to some meatal narrowing and a 28-French continuous flow resectoscope sheath was placed using the visual obturator and the 12-degree lens.  Examination revealed otherwise normal urethra.  The external sphincter was intact. The prostatic urethra was approximately 3 cm in length.  There was minimal lateral lobe enlargement, but he had a high bladder neck that was very tight.  He did not have as much intravesical extension as expected.  Inspection of the bladder revealed moderate trabeculation.  There was a small diverticulum on the right lateral wall, just superolateral to the ureteral orifice and approximately a 2 cm diverticulum on the left wall just lateral to the ureteral orifice consistent with a Hutch diverticulum.  The mucosa of the diverticula were unremarkable.  The ureteral orifices were otherwise unremarkable and no  other mucosal lesions were identified.  After thorough inspection, the resectoscope was fitted with an Beatrix Fetters handle with a 12-degree lens and bipolar loop with saline as the irrigant and the resection was initiated under continuous flow state. Initially the bladder neck was resected down to the bladder neck fibers, followed by the floor of the prostate out to and alongside the verumontanum.  Some limited lateral lobe tissue was resected as well as limited apical and anterior tissue.  My goal was to create a good channel without performing extensive resection  since he may require radiation therapy. Once an adequate channel had been created, the chips were evacuated and final hemostasis was achieved.  At this point, the scope was removed and pressure on the bladder produced an excellent stream.  A 22-French 3-way Foley catheter was inserted without difficulty.  The balloon was filled with sterile water to 30 mL and then hand irrigated with clear return. The catheter was placed to continuous irrigation and straight drainage. The patient was taken down from lithotomy position.  His anesthetic was reversed.  He was moved to recovery in stable condition.  There were no complications.     Marshall Cork. Jeffie Pollock, M.D.     JJW/MEDQ  D:  09/28/2014  T:  09/29/2014  Job:  [id number]

## 2014-09-29 NOTE — Progress Notes (Signed)
String of 3 bottles complete, urine clearing with each bottle.  Pt ready for discharge, VSS.  Discharge paperwork reviewed and signed, pt denies further questions or concerns at this time.  Awaiting pt's ride to arrive.  Coolidge Breeze, RN 09/29/2014

## 2014-09-30 NOTE — Telephone Encounter (Signed)
Pt aware order placed for ONO.  Pt is to call office back if Huey Romans has not contacted him to set this up in the next few days.  He verbalized understanding and voiced no further questions or concerns at this time.

## 2014-10-01 ENCOUNTER — Emergency Department (HOSPITAL_COMMUNITY): Payer: Medicare HMO

## 2014-10-01 ENCOUNTER — Emergency Department (HOSPITAL_COMMUNITY)
Admission: EM | Admit: 2014-10-01 | Discharge: 2014-10-01 | Disposition: A | Discharge status: Home / Self Care | Payer: Medicare HMO | Attending: Emergency Medicine | Admitting: Emergency Medicine

## 2014-10-01 ENCOUNTER — Encounter (HOSPITAL_COMMUNITY): Payer: Self-pay | Admitting: *Deleted

## 2014-10-01 DIAGNOSIS — Z8669 Personal history of other diseases of the nervous system and sense organs: Secondary | ICD-10-CM | POA: Diagnosis not present

## 2014-10-01 DIAGNOSIS — Z8611 Personal history of tuberculosis: Secondary | ICD-10-CM | POA: Insufficient documentation

## 2014-10-01 DIAGNOSIS — Z8619 Personal history of other infectious and parasitic diseases: Secondary | ICD-10-CM | POA: Diagnosis not present

## 2014-10-01 DIAGNOSIS — Z7951 Long term (current) use of inhaled steroids: Secondary | ICD-10-CM | POA: Insufficient documentation

## 2014-10-01 DIAGNOSIS — R1013 Epigastric pain: Secondary | ICD-10-CM

## 2014-10-01 DIAGNOSIS — Z79899 Other long term (current) drug therapy: Secondary | ICD-10-CM | POA: Insufficient documentation

## 2014-10-01 DIAGNOSIS — Z87448 Personal history of other diseases of urinary system: Secondary | ICD-10-CM | POA: Diagnosis not present

## 2014-10-01 DIAGNOSIS — Z87891 Personal history of nicotine dependence: Secondary | ICD-10-CM | POA: Insufficient documentation

## 2014-10-01 DIAGNOSIS — E119 Type 2 diabetes mellitus without complications: Secondary | ICD-10-CM | POA: Diagnosis not present

## 2014-10-01 DIAGNOSIS — K219 Gastro-esophageal reflux disease without esophagitis: Secondary | ICD-10-CM | POA: Diagnosis not present

## 2014-10-01 DIAGNOSIS — Z853 Personal history of malignant neoplasm of breast: Secondary | ICD-10-CM | POA: Diagnosis not present

## 2014-10-01 DIAGNOSIS — F419 Anxiety disorder, unspecified: Secondary | ICD-10-CM | POA: Insufficient documentation

## 2014-10-01 DIAGNOSIS — I10 Essential (primary) hypertension: Secondary | ICD-10-CM | POA: Diagnosis not present

## 2014-10-01 DIAGNOSIS — K625 Hemorrhage of anus and rectum: Secondary | ICD-10-CM | POA: Insufficient documentation

## 2014-10-01 DIAGNOSIS — Z880 Allergy status to penicillin: Secondary | ICD-10-CM | POA: Insufficient documentation

## 2014-10-01 DIAGNOSIS — J449 Chronic obstructive pulmonary disease, unspecified: Secondary | ICD-10-CM | POA: Insufficient documentation

## 2014-10-01 DIAGNOSIS — Z9889 Other specified postprocedural states: Secondary | ICD-10-CM | POA: Insufficient documentation

## 2014-10-01 DIAGNOSIS — Z8739 Personal history of other diseases of the musculoskeletal system and connective tissue: Secondary | ICD-10-CM | POA: Insufficient documentation

## 2014-10-01 LAB — CBC WITH DIFFERENTIAL/PLATELET
BASOS PCT: 0 % (ref 0–1)
Basophils Absolute: 0 10*3/uL (ref 0.0–0.1)
Eosinophils Absolute: 0.2 10*3/uL (ref 0.0–0.7)
Eosinophils Relative: 3 % (ref 0–5)
HCT: 40.6 % (ref 39.0–52.0)
Hemoglobin: 13.5 g/dL (ref 13.0–17.0)
Lymphocytes Relative: 25 % (ref 12–46)
Lymphs Abs: 1.7 10*3/uL (ref 0.7–4.0)
MCH: 32 pg (ref 26.0–34.0)
MCHC: 33.3 g/dL (ref 30.0–36.0)
MCV: 96.2 fL (ref 78.0–100.0)
Monocytes Absolute: 0.9 10*3/uL (ref 0.1–1.0)
Monocytes Relative: 12 % (ref 3–12)
NEUTROS ABS: 4.1 10*3/uL (ref 1.7–7.7)
NEUTROS PCT: 60 % (ref 43–77)
Platelets: 242 10*3/uL (ref 150–400)
RBC: 4.22 MIL/uL (ref 4.22–5.81)
RDW: 14 % (ref 11.5–15.5)
WBC: 6.9 10*3/uL (ref 4.0–10.5)

## 2014-10-01 LAB — URINALYSIS, ROUTINE W REFLEX MICROSCOPIC
Bilirubin Urine: NEGATIVE
GLUCOSE, UA: NEGATIVE mg/dL
KETONES UR: NEGATIVE mg/dL
Nitrite: NEGATIVE
PH: 5.5 (ref 5.0–8.0)
Protein, ur: 100 mg/dL — AB
SPECIFIC GRAVITY, URINE: 1.018 (ref 1.005–1.030)
Urobilinogen, UA: 0.2 mg/dL (ref 0.0–1.0)

## 2014-10-01 LAB — COMPREHENSIVE METABOLIC PANEL
ALBUMIN: 3.9 g/dL (ref 3.5–5.2)
ALK PHOS: 62 U/L (ref 39–117)
ALT: 30 U/L (ref 0–53)
AST: 27 U/L (ref 0–37)
Anion gap: 14 (ref 5–15)
BILIRUBIN TOTAL: 0.5 mg/dL (ref 0.3–1.2)
BUN: 18 mg/dL (ref 6–23)
CHLORIDE: 100 meq/L (ref 96–112)
CO2: 24 mEq/L (ref 19–32)
CREATININE: 1.22 mg/dL (ref 0.50–1.35)
Calcium: 9.4 mg/dL (ref 8.4–10.5)
GFR calc Af Amer: 71 mL/min — ABNORMAL LOW (ref >90)
GFR calc non Af Amer: 61 mL/min — ABNORMAL LOW (ref >90)
Glucose, Bld: 92 mg/dL (ref 70–99)
POTASSIUM: 4.1 meq/L (ref 3.7–5.3)
Sodium: 138 mEq/L (ref 137–147)
Total Protein: 7 g/dL (ref 6.0–8.3)

## 2014-10-01 LAB — URINE MICROSCOPIC-ADD ON

## 2014-10-01 MED ORDER — SIMETHICONE 40 MG/0.6ML PO SUSP (UNIT DOSE)
40.0000 mg | Freq: Once | ORAL | Status: AC
  Administered 2014-10-01: 40 mg via ORAL
  Filled 2014-10-01: qty 0.6

## 2014-10-01 MED ORDER — SODIUM CHLORIDE 0.9 % IV BOLUS (SEPSIS)
500.0000 mL | Freq: Once | INTRAVENOUS | Status: AC
  Administered 2014-10-01: 500 mL via INTRAVENOUS

## 2014-10-01 MED ORDER — ONDANSETRON HCL 4 MG/2ML IJ SOLN
4.0000 mg | Freq: Once | INTRAMUSCULAR | Status: AC
  Administered 2014-10-01: 4 mg via INTRAVENOUS
  Filled 2014-10-01: qty 2

## 2014-10-01 MED ORDER — HYDROMORPHONE HCL 1 MG/ML IJ SOLN
0.5000 mg | INTRAMUSCULAR | Status: AC
  Administered 2014-10-01: 0.5 mg via INTRAVENOUS
  Filled 2014-10-01: qty 1

## 2014-10-01 NOTE — ED Notes (Addendum)
Pt reports he has prostate surgery on 12/16, pt was unable to have a bowel movement, so pt took an enema, pt was not suppose to use enemas. Pt then had a large bloody bowel movement around 1300. No bowel movements since. Abdominal pain 8/10. Pt called Dr Roni Bread [facility] and was told to come to ED.

## 2014-10-01 NOTE — Discharge Instructions (Signed)

## 2014-10-01 NOTE — ED Notes (Signed)
Bed: TX77 Expected date:  Expected time:  Means of arrival:  Comments: [place]

## 2014-10-01 NOTE — ED Provider Notes (Signed)
CSN: [id number]     Arrival date & time 10/01/14  1721 History   First MD Initiated Contact with Patient 10/01/14 1925     Chief Complaint  Patient presents with  . Rectal Bleeding  . prostate surgery this week      (Consider location/radiation/quality/duration/timing/severity/associated sxs/prior Treatment) Patient is a 63 y.o. male presenting with hematochezia. The history is provided by the patient.  Rectal Bleeding Quality: dark red. Amount:  Moderate Timing: once after an enema. Progression:  Resolved Chronicity:  New Context comment:  Recent prostate bx Similar prior episodes: no   Relieved by:  Nothing Worsened by:  Nothing tried Ineffective treatments:  None tried Associated symptoms: abdominal pain   Associated symptoms: no fever and no vomiting     Past Medical History  Diagnosis Date  . Hypertension   . Prostate cancer   . GERD (gastroesophageal reflux disease)   . History of TB (tuberculosis)     1984--  hospitalized for 4 month treatment  . Post-polio syndrome     polio at age 59--PT WAS IN IRON LUNG; PT WAS IN W/C UNTIL AGE 41; STILL HAS WEAKNESS RIGHT SIDE  . History of chronic bronchitis   . History of rheumatic fever   . COPD, frequent exacerbations     pulmologist-  dr Joya Gaskins--  Gold Stage C  . Bladder outlet obstruction   . Anxiety disorder   . History of urinary retention   . Nocturnal oxygen desaturation     USES O2 NIGHTLY  . Prostate cancer   . Complication of anesthesia     DIFFICULT WAKING   . Diabetes mellitus without complication     BODERLINE - DIET CONTROL  . Anxiety   . Shortness of breath dyspnea     RIGHT HEMIDIAPHRAGM ELEVATION - POST POLIO SYNDROME  . Dysrhythmia     PVC'S  . Arthritis     BILATERAL SHOULDERS, ELBOWS AND HANDS AND LEFT HIP AND KNEES--HX CORTISONE SHOTS IN SHOULDERS, ELBOWS, HIP AND KNEES   Past Surgical History  Procedure Laterality Date  . Other surgical history       Muscle & bone Graft/Polio  .  Cystoscopy w/ cystogram/  transrectal ultrasound prostate bx  03-22-2009  . Cardiovascular stress test  06-08-2014  dr Mare Ferrari    normal lexiscan study/  no ischemia/  not gated due to PAC's  . Ureterosopy stone extraction  2000  . Nasal septum surgery  2000  . Shoulder arthroscopy with open rotator cuff repair Bilateral 2013  &  1999    removal spurs and bursectomy  . Laparoscopic cholecystectomy  2013  . Prostate biopsy N/A 09/28/2014    Procedure: PROSTATE ULTRASOUND/BIOPSY;  Surgeon: [name], MD;  Location: [facility];  Service: Urology;  Laterality: N/A;  . Transurethral resection of prostate N/A 09/28/2014    Procedure: TRANSURETHRAL RESECTION OF THE PROSTATE (TURP);  Surgeon: [name], MD;  Location: [facility];  Service: Urology;  Laterality: N/A;   Family History  Problem Relation Age of Onset  . Alzheimer's disease Father 4    Deceased  . Stomach cancer Father   . Heart attack Father   . Heart disease Father   . Skin cancer Mother     Facial-Living  . Alcohol abuse Sister     x2  . Mental illness Sister     x2  . Diabetes Maternal Aunt     x2  . Thyroid disease Maternal Aunt     x4  .  Diabetes Maternal Uncle   . Tuberculosis Paternal Grandfather   . Tuberculosis Paternal Grandmother   . Alzheimer's disease Paternal Aunt   . Alzheimer's disease Paternal Uncle    History  Substance Use Topics  . Smoking status: Former Smoker -- 60 years    Quit date: 08/25/2013  . Smokeless tobacco: Never Used     Comment: hx  smoke a pack of pipe tobacco(1.5oz) every 10 days.   . Alcohol Use: 12.6 oz/week    21 Cans of beer per week     Comment: average 3 beers per day    Review of Systems  Constitutional: Negative for fever.  HENT: Negative for drooling and rhinorrhea.   Eyes: Negative for pain.  Respiratory: Negative for cough and shortness of breath.   Cardiovascular: Negative for chest pain and leg swelling.  Gastrointestinal: Positive for abdominal pain and  hematochezia. Negative for nausea, vomiting and diarrhea.  Genitourinary: Negative for dysuria and hematuria.  Musculoskeletal: Negative for gait problem and neck pain.  Skin: Negative for color change.  Neurological: Negative for numbness and headaches.  Hematological: Negative for adenopathy.  Psychiatric/Behavioral: Negative for behavioral problems.  All other systems reviewed and are negative.     Allergies  Ivp dye; Levaquin; Penicillins; Aspirin; Morphine and related; and Nsaids  Home Medications   Prior to Admission medications   Medication Sig Start Date End Date Taking? Authorizing Provider  albuterol (PROVENTIL HFA;VENTOLIN HFA) 108 (90 BASE) MCG/ACT inhaler Inhale 1-2 puffs into the lungs every 6 (six) hours as needed for wheezing or shortness of breath. PROAIR 06/04/14  Yes Brunetta Jeans, PA-C  albuterol (PROVENTIL) (2.5 MG/3ML) 0.083% nebulizer solution Take 2.5 mg by nebulization every 6 (six) hours as needed for wheezing or shortness of breath. Pt uses 2-3X daily   Yes Historical Provider, MD  ALPRAZolam (XANAX) 1 MG tablet Take 1 tablet (1 mg total) by mouth 3 (three) times daily as needed for anxiety. Patient taking differently: Take 0.5 mg by mouth 3 (three) times daily as needed for anxiety.  06/03/14  Yes Brunetta Jeans, PA-C  amLODipine (NORVASC) 10 MG tablet Take 10 mg by mouth every morning.    Yes Historical Provider, MD  famotidine (PEPCID) 20 MG tablet Take 20 mg by mouth daily. TAKES AT 4 PM DAILY   Yes Historical Provider, MD  Fluticasone Furoate-Vilanterol 100-25 MCG/INH AEPB Inhale 1 puff into the lungs daily. BREO ELLIPTA   Yes Historical Provider, MD  loratadine (CLARITIN) 10 MG tablet Take 10 mg by mouth daily as needed for allergies.   Yes Historical Provider, MD  metoprolol succinate (TOPROL-XL) 25 MG 24 hr tablet Take 1 tablet (25 mg total) by mouth daily. 09/01/14  Yes Brunetta Jeans, PA-C  montelukast (SINGULAIR) 10 MG tablet Take 10 mg by mouth  at bedtime.   Yes Historical Provider, MD  omeprazole (PRILOSEC) 20 MG capsule Take 1 capsule (20 mg total) by mouth daily. 07/08/14  Yes Elsie Stain, MD  oxyCODONE-acetaminophen (PERCOCET) 10-325 MG per tablet Take 1 tablet by mouth every 8 (eight) hours as needed for pain. 07/02/14  Yes Brunetta Jeans, PA-C  promethazine (PHENERGAN) 12.5 MG tablet Take 1 tablet (12.5 mg total) by mouth every 8 (eight) hours as needed for nausea or vomiting. 07/13/14  Yes Brunetta Jeans, PA-C  tiotropium (SPIRIVA) 18 MCG inhalation capsule Place 18 mcg into inhaler and inhale daily. IN AM   Yes Historical Provider, MD  baclofen (LIORESAL) 10 MG tablet Take  1 tablet (10 mg total) by mouth 2 (two) times daily. Patient not taking: Reported on 10/01/2014 09/27/14   Brunetta Jeans, PA-C  fluticasone [facility]) 50 MCG/ACT nasal spray Place 2 sprays into both nostrils daily. Patient not taking: Reported on 10/01/2014 07/08/14   Elsie Stain, MD   BP 151/58 mmHg  Pulse 59  Temp(Src) 98.5 F (36.9 C) (Oral)  Resp 16  SpO2 100% Physical Exam  Constitutional: He is oriented to person, place, and time. He appears well-developed and well-nourished.  HENT:  Head: Normocephalic and atraumatic.  Right Ear: External ear normal.  Left Ear: External ear normal.  Nose: Nose normal.  Mouth/Throat: Oropharynx is clear and moist. No oropharyngeal exudate.  Eyes: Conjunctivae and EOM are normal. Pupils are equal, round, and reactive to light.  Neck: Normal range of motion. Neck supple.  Cardiovascular: Normal rate, regular rhythm, normal heart sounds and intact distal pulses.  Exam reveals no gallop and no friction rub.   No murmur heard. Pulmonary/Chest: Effort normal and breath sounds normal. No respiratory distress. He has no wheezes.  Abdominal: Soft. Bowel sounds are normal. He exhibits distension (mild). There is tenderness (mild tenderness to palpation of the epigastric area.). There is no rebound and no  guarding.  Musculoskeletal: Normal range of motion. He exhibits no edema or tenderness.  Neurological: He is alert and oriented to person, place, and time.  Skin: Skin is warm and dry.  Psychiatric: He has a normal mood and affect. His behavior is normal.  Nursing note and vitals reviewed.   ED Course  Procedures (including critical care time) Labs Review Labs Reviewed  COMPREHENSIVE METABOLIC PANEL - Abnormal; Notable for the following:    GFR calc non Af Amer 61 (*)    GFR calc Af Amer 71 (*)    All other components within normal limits  URINALYSIS, ROUTINE W REFLEX MICROSCOPIC - Abnormal; Notable for the following:    APPearance CLOUDY (*)    Hgb urine dipstick LARGE (*)    Protein, ur 100 (*)    Leukocytes, UA TRACE (*)    All other components within normal limits  URINE MICROSCOPIC-ADD ON - Abnormal; Notable for the following:    Casts GRANULAR CAST (*)    All other components within normal limits  CBC WITH DIFFERENTIAL    Imaging Review Ct Abdomen Pelvis Wo Contrast  10/01/2014   CLINICAL DATA:  Large bloody bowel movement after enema; patient was not supposed to use enemas, following prostate biopsy on 09/28/2014. Initial encounter.  EXAM: CT ABDOMEN AND PELVIS WITHOUT CONTRAST  TECHNIQUE: Multidetector CT imaging of the abdomen and pelvis was performed following the standard protocol without IV contrast.  COMPARISON:  CT of the abdomen and pelvis from 06/25/2014  FINDINGS: The visualized lung bases are clear.  The liver and spleen are unremarkable in appearance. The patient is status post cholecystectomy, with clips noted along the gallbladder fossa. The pancreas and adrenal glands are unremarkable.  Nonspecific perinephric stranding is noted bilaterally. The kidneys are otherwise unremarkable. There is no evidence of hydronephrosis. No renal or ureteral stones are identified.  No free fluid is identified. The small bowel is unremarkable in appearance. The stomach is within  normal limits. No acute vascular abnormalities are seen.  The appendix is normal in caliber, without evidence for appendicitis. The colon contains a small amount of stool but is otherwise unremarkable in appearance.  The bladder is mildly distended; a 2.3 cm cystic focus adjacent to the left  posterior base of the bladder could again reflect a bladder diverticulum. Slight prominence of the prostatic urethra and mild heterogeneity within the prostate may reflect recent prostate biopsy. The prostate remains borderline normal in size. No inguinal lymphadenopathy is seen. A small left inguinal hernia is again noted, containing only fat.  No acute osseous abnormalities are identified. Facet disease is noted at the lower lumbar spine.  IMPRESSION: 1. No acute findings seen to explain the patient's symptoms. 2. The colon contains a small amount of stool but is otherwise unremarkable in appearance. 3. Slight prominence of the prostatic urethra and mild heterogeneity within the prostate may reflect recent prostate biopsy. 4. 2.3 cm cystic focus adjacent to the left posterior base of the bladder could reflect a bladder diverticulum, as previously noted. It is stable from multiple prior studies. 5. Small left inguinal hernia, containing only fat.   Electronically Signed   By: Garald Balding M.D.   On: 10/01/2014 21:28     EKG Interpretation   Date/Time:  Friday October 01 2014 21:15:27 EST Ventricular Rate:  55 PR Interval:  176 QRS Duration: 102 QT Interval:  430 QTC Calculation: 411 R Axis:   75 Text Interpretation:  Sinus rhythm Atrial premature complexes RSR' in V1  or V2, right VCD or RVH No significant change since last tracing Confirmed  by Gracianna Vink  MD, Josuel Koeppen ([id number]) on 10/01/2014 9:19:31 PM      MDM   Final diagnoses:  Epigastric pain  Rectal bleeding    8:28 PM 63 y.o. male w hx of prostate ca status post prostate biopsy 3 days ago who presents with abdominal pain and a bloody BM. He states  that he has had intermittent epigastric pain for the last 2 days. He notes that he is not passing gas and has not had a bowel movement. He does have a history of constipation and tried an enema earlier today. When he tried to have a BM after the enema he saw dark red bloody water. He has not had any more rectal bleeding since that time. He is afebrile and vital signs are unremarkable here. We'll get screening labs and imaging. Of note patient has an allergy to contrast.  11:07 PM: I interpreted/reviewed the labs and/or imaging which were non-contributory. Bloody enema water not unusual given recent prostate bx. Pt has had no more episodes. He did belch and pass gas here and is feeling much better on exam. I recommended miralax instead of the enemas for constipation. I have discussed the diagnosis/risks/treatment options with the patient and believe the pt to be eligible for discharge home to follow-up with Dr. Roni Bread. We also discussed returning to the ED immediately if new or worsening sx occur. We discussed the sx which are most concerning (e.g., worsening abd pain, fever, recurrent bloody bm's) that necessitate immediate return. Medications administered to the patient during their visit and any new prescriptions provided to the patient are listed below.  Medications given during this visit Medications  simethicone (MYLICON) 40 TI/1.4ER suspension 40 mg (not administered)  HYDROmorphone (DILAUDID) injection 0.5 mg (0.5 mg Intravenous Given 10/01/14 2041)  sodium chloride 0.9 % bolus 500 mL (500 mLs Intravenous New Bag/Given 10/01/14 2040)  ondansetron (ZOFRAN) injection 4 mg (4 mg Intravenous Given 10/01/14 2041)    New Prescriptions   No medications on file     Pamella Pert, MD 10/01/14 2335

## 2014-10-01 NOTE — ED Notes (Signed)
EKG given to EDP,Jacubowitz,MD., for review. 

## 2014-10-01 NOTE — ED Notes (Signed)
Pt. In xray 

## 2014-10-06 ENCOUNTER — Encounter: Payer: Self-pay | Admitting: Physician Assistant

## 2014-10-06 ENCOUNTER — Ambulatory Visit (INDEPENDENT_AMBULATORY_CARE_PROVIDER_SITE_OTHER): Payer: Medicare HMO | Admitting: Physician Assistant

## 2014-10-06 VITALS — BP 109/67 | HR 64 | Temp 97.7°F | Resp 16 | Ht 73.0 in | Wt 215.1 lb

## 2014-10-06 DIAGNOSIS — J45909 Unspecified asthma, uncomplicated: Secondary | ICD-10-CM

## 2014-10-06 DIAGNOSIS — G4700 Insomnia, unspecified: Secondary | ICD-10-CM

## 2014-10-06 DIAGNOSIS — J42 Unspecified chronic bronchitis: Secondary | ICD-10-CM

## 2014-10-06 DIAGNOSIS — M129 Arthropathy, unspecified: Secondary | ICD-10-CM

## 2014-10-06 DIAGNOSIS — M2560 Stiffness of unspecified joint, not elsewhere classified: Secondary | ICD-10-CM

## 2014-10-06 DIAGNOSIS — M170 Bilateral primary osteoarthritis of knee: Secondary | ICD-10-CM

## 2014-10-06 LAB — RHEUMATOID FACTOR: Rhuematoid fact SerPl-aCnc: <10 IU/mL (ref <=14)

## 2014-10-06 MED ORDER — ZOLPIDEM TARTRATE 5 MG PO TABS: 5.0000 mg | ORAL_TABLET | Freq: Every evening | ORAL | Status: DC | PRN

## 2014-10-06 NOTE — Progress Notes (Signed)
Pre visit review using our clinic review tool, if applicable. No additional management support is needed unless otherwise documented below in the visit note/SLS  

## 2014-10-06 NOTE — Patient Instructions (Signed)
Please continue medications, starting the Ambien and taking at bedtime over the next couple of weeks.  This will help get you back in your normal pattern.  You will be contacted by Sports Medicine and Pulmonology for assessment.  I will call you with your lab results.  We will treat based on results.

## 2014-10-07 LAB — CYCLIC CITRUL PEPTIDE ANTIBODY, IGG: Cyclic Citrullin Peptide Ab: <2 U/mL (ref 0.0–5.0)

## 2014-10-11 ENCOUNTER — Encounter (HOSPITAL_BASED_OUTPATIENT_CLINIC_OR_DEPARTMENT_OTHER): Payer: Self-pay | Admitting: Emergency Medicine

## 2014-10-11 ENCOUNTER — Emergency Department (HOSPITAL_BASED_OUTPATIENT_CLINIC_OR_DEPARTMENT_OTHER)
Admission: EM | Admit: 2014-10-11 | Discharge: 2014-10-11 | Disposition: A | Discharge status: Home / Self Care | Payer: Medicare HMO | Attending: Emergency Medicine | Admitting: Emergency Medicine

## 2014-10-11 DIAGNOSIS — Z87891 Personal history of nicotine dependence: Secondary | ICD-10-CM | POA: Insufficient documentation

## 2014-10-11 DIAGNOSIS — M1389 Other specified arthritis, multiple sites: Secondary | ICD-10-CM | POA: Diagnosis not present

## 2014-10-11 DIAGNOSIS — Z9981 Dependence on supplemental oxygen: Secondary | ICD-10-CM | POA: Insufficient documentation

## 2014-10-11 DIAGNOSIS — Z8612 Personal history of poliomyelitis: Secondary | ICD-10-CM | POA: Insufficient documentation

## 2014-10-11 DIAGNOSIS — Z880 Allergy status to penicillin: Secondary | ICD-10-CM | POA: Diagnosis not present

## 2014-10-11 DIAGNOSIS — Z79899 Other long term (current) drug therapy: Secondary | ICD-10-CM | POA: Diagnosis not present

## 2014-10-11 DIAGNOSIS — R319 Hematuria, unspecified: Secondary | ICD-10-CM

## 2014-10-11 DIAGNOSIS — K219 Gastro-esophageal reflux disease without esophagitis: Secondary | ICD-10-CM | POA: Insufficient documentation

## 2014-10-11 DIAGNOSIS — Z7951 Long term (current) use of inhaled steroids: Secondary | ICD-10-CM | POA: Diagnosis not present

## 2014-10-11 DIAGNOSIS — E119 Type 2 diabetes mellitus without complications: Secondary | ICD-10-CM | POA: Diagnosis not present

## 2014-10-11 DIAGNOSIS — Z8546 Personal history of malignant neoplasm of prostate: Secondary | ICD-10-CM | POA: Insufficient documentation

## 2014-10-11 DIAGNOSIS — Z8611 Personal history of tuberculosis: Secondary | ICD-10-CM | POA: Insufficient documentation

## 2014-10-11 DIAGNOSIS — F419 Anxiety disorder, unspecified: Secondary | ICD-10-CM | POA: Insufficient documentation

## 2014-10-11 DIAGNOSIS — J449 Chronic obstructive pulmonary disease, unspecified: Secondary | ICD-10-CM | POA: Diagnosis not present

## 2014-10-11 DIAGNOSIS — Z87448 Personal history of other diseases of urinary system: Secondary | ICD-10-CM | POA: Insufficient documentation

## 2014-10-11 DIAGNOSIS — I10 Essential (primary) hypertension: Secondary | ICD-10-CM | POA: Insufficient documentation

## 2014-10-11 LAB — URINALYSIS, ROUTINE W REFLEX MICROSCOPIC
BILIRUBIN URINE: NEGATIVE
GLUCOSE, UA: NEGATIVE mg/dL
Ketones, ur: 15 mg/dL — AB
Nitrite: NEGATIVE
Protein, ur: 100 mg/dL — AB
SPECIFIC GRAVITY, URINE: 1.02 (ref 1.005–1.030)
Urobilinogen, UA: 0.2 mg/dL (ref 0.0–1.0)
pH: 5.5 (ref 5.0–8.0)

## 2014-10-11 LAB — URINE MICROSCOPIC-ADD ON

## 2014-10-11 MED ORDER — DOXYCYCLINE HYCLATE 100 MG PO CAPS: 100.0000 mg | ORAL_CAPSULE | Freq: Two times a day (BID) | ORAL | Status: DC

## 2014-10-11 MED ORDER — TAMSULOSIN HCL 0.4 MG PO CAPS
0.4000 mg | ORAL_CAPSULE | Freq: Every day | ORAL | Status: DC
  Administered 2014-10-11: 0.4 mg via ORAL
  Filled 2014-10-11: qty 1

## 2014-10-11 MED ORDER — DOXYCYCLINE HYCLATE 100 MG PO TABS
100.0000 mg | ORAL_TABLET | Freq: Once | ORAL | Status: AC
  Administered 2014-10-11: 100 mg via ORAL
  Filled 2014-10-11: qty 1

## 2014-10-11 NOTE — ED Notes (Signed)
C/o abd pain, started in R flank and ten around to abd, also hematuria and intermittent nausea, (denies: vd, fever), last ate 1900, last BM 1930 (normal), had a beer earlier in the day ~1300. Alert, NAD, calm, interactive, wife at [facility].

## 2014-10-11 NOTE — Discharge Instructions (Signed)

## 2014-10-11 NOTE — ED Notes (Signed)
Pt reports blood in urine dark red with clots also report prostates surgery

## 2014-10-11 NOTE — ED Provider Notes (Signed)
CSN: [id number]     Arrival date & time 10/11/14  0008 History   First MD Initiated Contact with Patient 10/11/14 0108     Chief Complaint  Patient presents with  . Hematuria     (Consider location/radiation/quality/duration/timing/severity/associated sxs/prior Treatment) Patient is a 63 y.o. male presenting with hematuria. The history is provided by the patient.  Hematuria This is a recurrent problem. The current episode started more than 1 week ago. The problem occurs constantly. The problem has been gradually worsening. Pertinent negatives include no chest pain, no headaches and no shortness of breath. Nothing aggravates the symptoms. Nothing relieves the symptoms. He has tried nothing for the symptoms. The treatment provided no relief.  TURP on 12/15 now passing clots and blood..    Past Medical History  Diagnosis Date  . Hypertension   . Prostate cancer   . GERD (gastroesophageal reflux disease)   . History of TB (tuberculosis)     1984--  hospitalized for 4 month treatment  . Post-polio syndrome     polio at age 66--PT WAS IN IRON LUNG; PT WAS IN W/C UNTIL AGE 21; STILL HAS WEAKNESS RIGHT SIDE  . History of chronic bronchitis   . History of rheumatic fever   . COPD, frequent exacerbations     pulmologist-  dr Joya Gaskins--  Gold Stage C  . Bladder outlet obstruction   . Anxiety disorder   . History of urinary retention   . Nocturnal oxygen desaturation     USES O2 NIGHTLY  . Prostate cancer   . Complication of anesthesia     DIFFICULT WAKING   . Diabetes mellitus without complication     BODERLINE - DIET CONTROL  . Anxiety   . Shortness of breath dyspnea     RIGHT HEMIDIAPHRAGM ELEVATION - POST POLIO SYNDROME  . Dysrhythmia     PVC'S  . Arthritis     BILATERAL SHOULDERS, ELBOWS AND HANDS AND LEFT HIP AND KNEES--HX CORTISONE SHOTS IN SHOULDERS, ELBOWS, HIP AND KNEES   Past Surgical History  Procedure Laterality Date  . Other surgical history       Muscle & bone  Graft/Polio  . Cystoscopy w/ cystogram/  transrectal ultrasound prostate bx  03-22-2009  . Cardiovascular stress test  06-08-2014  dr Mare Ferrari    normal lexiscan study/  no ischemia/  not gated due to PAC's  . Ureterosopy stone extraction  2000  . Nasal septum surgery  2000  . Shoulder arthroscopy with open rotator cuff repair Bilateral 2013  &  1999    removal spurs and bursectomy  . Laparoscopic cholecystectomy  2013  . Prostate biopsy N/A 09/28/2014    Procedure: PROSTATE ULTRASOUND/BIOPSY;  Surgeon: [name], MD;  Location: [facility];  Service: Urology;  Laterality: N/A;  . Transurethral resection of prostate N/A 09/28/2014    Procedure: TRANSURETHRAL RESECTION OF THE PROSTATE (TURP);  Surgeon: [name], MD;  Location: [facility];  Service: Urology;  Laterality: N/A;   Family History  Problem Relation Age of Onset  . Alzheimer's disease Father 4    Deceased  . Stomach cancer Father   . Heart attack Father   . Heart disease Father   . Skin cancer Mother     Facial-Living  . Alcohol abuse Sister     x2  . Mental illness Sister     x2  . Diabetes Maternal Aunt     x2  . Thyroid disease Maternal Aunt     x4  .  Diabetes Maternal Uncle   . Tuberculosis Paternal Grandfather   . Tuberculosis Paternal Grandmother   . Alzheimer's disease Paternal Aunt   . Alzheimer's disease Paternal Uncle    History  Substance Use Topics  . Smoking status: Former Smoker -- 58 years    Quit date: 08/25/2013  . Smokeless tobacco: Never Used     Comment: hx  smoke a pack of pipe tobacco(1.5oz) every 10 days.   . Alcohol Use: 12.6 oz/week    21 Cans of beer per week     Comment: average 3 beers per day    Review of Systems  Respiratory: Negative for shortness of breath.   Cardiovascular: Negative for chest pain.  Genitourinary: Positive for hematuria. Negative for dysuria and flank pain.  Neurological: Negative for headaches.  All other systems reviewed and are  negative.     Allergies  Ivp dye; Levaquin; Penicillins; Aspirin; Morphine and related; and Nsaids  Home Medications   Prior to Admission medications   Medication Sig Start Date End Date Taking? Authorizing Provider  albuterol (PROVENTIL HFA;VENTOLIN HFA) 108 (90 BASE) MCG/ACT inhaler Inhale 1-2 puffs into the lungs every 6 (six) hours as needed for wheezing or shortness of breath. PROAIR 06/04/14   Brunetta Jeans, PA-C  albuterol (PROVENTIL) (2.5 MG/3ML) 0.083% nebulizer solution Take 2.5 mg by nebulization every 6 (six) hours as needed for wheezing or shortness of breath. Pt uses 2-3X daily    Historical Provider, MD  ALPRAZolam (XANAX) 1 MG tablet Take 1 tablet (1 mg total) by mouth 3 (three) times daily as needed for anxiety. Patient taking differently: Take 0.5 mg by mouth 3 (three) times daily as needed for anxiety.  06/03/14   Brunetta Jeans, PA-C  amLODipine (NORVASC) 10 MG tablet Take 10 mg by mouth every morning.     Historical Provider, MD  baclofen (LIORESAL) 10 MG tablet Take 1 tablet (10 mg total) by mouth 2 (two) times daily. 09/27/14   Brunetta Jeans, PA-C  famotidine (PEPCID) 20 MG tablet Take 20 mg by mouth daily. TAKES AT 4 PM DAILY    Historical Provider, MD  fluticasone (FLONASE) 50 MCG/ACT nasal spray Place 2 sprays into both nostrils daily. 07/08/14   Elsie Stain, MD  Fluticasone Furoate-Vilanterol 100-25 MCG/INH AEPB Inhale 1 puff into the lungs daily. BREO ELLIPTA    Historical Provider, MD  loratadine (CLARITIN) 10 MG tablet Take 10 mg by mouth daily as needed for allergies.    Historical Provider, MD  metoprolol succinate (TOPROL-XL) 25 MG 24 hr tablet Take 1 tablet (25 mg total) by mouth daily. 09/01/14   Brunetta Jeans, PA-C  montelukast (SINGULAIR) 10 MG tablet Take 10 mg by mouth at bedtime.    Historical Provider, MD  omeprazole (PRILOSEC) 20 MG capsule Take 1 capsule (20 mg total) by mouth daily. 07/08/14   Elsie Stain, MD   oxyCODONE-acetaminophen (PERCOCET) 10-325 MG per tablet Take 1 tablet by mouth every 8 (eight) hours as needed for pain. 07/02/14   Brunetta Jeans, PA-C  promethazine (PHENERGAN) 12.5 MG tablet Take 1 tablet (12.5 mg total) by mouth every 8 (eight) hours as needed for nausea or vomiting. 07/13/14   Brunetta Jeans, PA-C  tiotropium (SPIRIVA) 18 MCG inhalation capsule Place 18 mcg into inhaler and inhale daily. IN AM    Historical Provider, MD  zolpidem (AMBIEN) 5 MG tablet Take 1 tablet (5 mg total) by mouth at bedtime as needed for sleep. 10/06/14  Brunetta Jeans, PA-C   BP 137/63 mmHg  Pulse 68  Temp(Src) 97.9 F (36.6 C) (Oral)  Resp 18  Ht 6\' 1"  (1.854 m)  Wt 215 lb (97.523 kg)  BMI 28.37 kg/m2  SpO2 99% Physical Exam  Constitutional: He is oriented to person, place, and time. He appears well-developed and well-nourished.  HENT:  Head: Normocephalic and atraumatic.  Eyes: EOM are normal.  Neck: Normal range of motion. Neck supple.  Cardiovascular: Normal rate, regular rhythm and intact distal pulses.   Pulmonary/Chest: Effort normal and breath sounds normal. No respiratory distress. He has no wheezes. He has no rales.  Abdominal: Soft. Bowel sounds are normal. There is no tenderness. There is no rebound and no guarding.  Musculoskeletal: Normal range of motion.  Neurological: He is alert and oriented to person, place, and time.  Skin: Skin is warm and dry.  Psychiatric: He has a normal mood and affect.    ED Course  Procedures (including critical care time) Labs Review Labs Reviewed  URINALYSIS, ROUTINE W REFLEX MICROSCOPIC - Abnormal; Notable for the following:    Color, Urine RED (*)    APPearance CLOUDY (*)    Hgb urine dipstick LARGE (*)    Ketones, ur 15 (*)    Protein, ur 100 (*)    Leukocytes, UA MODERATE (*)    All other components within normal limits  URINE MICROSCOPIC-ADD ON - Abnormal; Notable for the following:    Squamous Epithelial / LPF FEW (*)     Bacteria, UA FEW (*)    All other components within normal limits    Imaging Review No results found.   EKG Interpretation None      MDM   Final diagnoses:  None    Case d/w Dr. Risa Grill via phone.  Doxycycline or bactrim restart flomax and call to be seen     Marshaun Lortie K Sye Schroepfer-Rasch, MD 10/11/14 [phone]

## 2014-10-12 ENCOUNTER — Telehealth (HOSPITAL_COMMUNITY): Payer: Self-pay | Admitting: *Deleted

## 2014-10-13 ENCOUNTER — Telehealth: Payer: Self-pay | Admitting: Critical Care Medicine

## 2014-10-13 DIAGNOSIS — J449 Chronic obstructive pulmonary disease, unspecified: Secondary | ICD-10-CM

## 2014-10-13 NOTE — Telephone Encounter (Signed)
Yes would repeat at a later date after urology issues better

## 2014-10-13 NOTE — Telephone Encounter (Signed)
Pt returned call - [phone]

## 2014-10-13 NOTE — Telephone Encounter (Signed)
Called and spoke with pt and he is aware of PW recs.  He will call back once he is ready to re-take the test.

## 2014-10-13 NOTE — Telephone Encounter (Signed)
LMTCB x 1 

## 2014-10-13 NOTE — Telephone Encounter (Signed)
Tell pt ono on RA was normal

## 2014-10-13 NOTE — Telephone Encounter (Signed)
Called, spoke with pt.  Discussed below per Dr. Joya Gaskins.  Pt states he really did not sleep during test.  He "laid there" most of the night and couldn't sleep d/t dysuria and hematuria.  The morning of the test he went to ED d/t these symptoms.  Would like to know if test should be repeated.  Dr. Joya Gaskins, pls advise.  Thank you.

## 2014-10-17 DIAGNOSIS — M2560 Stiffness of unspecified joint, not elsewhere classified: Secondary | ICD-10-CM | POA: Insufficient documentation

## 2014-10-17 DIAGNOSIS — M170 Bilateral primary osteoarthritis of knee: Secondary | ICD-10-CM | POA: Insufficient documentation

## 2014-10-17 NOTE — Assessment & Plan Note (Signed)
Will check rheumatoid lab panel today.  If positive, will refer to Rheumatology.  Continue pain medications as directed.  Start Tumeric supplement to help lower inflammation in the body.

## 2014-10-17 NOTE — Assessment & Plan Note (Signed)
Referral to Sports Medicine placed.  Exercises reviewed.  Patient refuses further pain medication at present.

## 2014-10-17 NOTE — Progress Notes (Signed)
Patient presents to clinic today complaining of bilateral hand/wrist pain with morning stiffness lasting for a few hours.  Symptoms improve as the day goes on.  Patient with + family history of RA.  Patient also with significant personal history of osteoarthritis.    Patient requesting new referrals to Pulmonology (COPD/Asthma) and Sports Medicine (Bilateral Knee OA).  States he is unhappy with current specialists.  For Pulmonology, patient requesting to see an MD with [facility]. Is still taking his COPD medications as directed.  Denies worsening SOB, chest tightness or fatigue.  For Sports Medicine, patient wished to see Dr. Barbaraann Barthel upstairs.   Past Medical History  Diagnosis Date  . Hypertension   . Prostate cancer   . GERD (gastroesophageal reflux disease)   . History of TB (tuberculosis)     1984--  hospitalized for 4 month treatment  . Post-polio syndrome     polio at age 74--PT WAS IN IRON LUNG; PT WAS IN W/C UNTIL AGE 63; STILL HAS WEAKNESS RIGHT SIDE  . History of chronic bronchitis   . History of rheumatic fever   . COPD, frequent exacerbations     pulmologist-  dr Joya Gaskins--  Gold Stage C  . Bladder outlet obstruction   . Anxiety disorder   . History of urinary retention   . Nocturnal oxygen desaturation     USES O2 NIGHTLY  . Prostate cancer   . Complication of anesthesia     DIFFICULT WAKING   . Diabetes mellitus without complication     BODERLINE - DIET CONTROL  . Anxiety   . Shortness of breath dyspnea     RIGHT HEMIDIAPHRAGM ELEVATION - POST POLIO SYNDROME  . Dysrhythmia     PVC'S  . Arthritis     BILATERAL SHOULDERS, ELBOWS AND HANDS AND LEFT HIP AND KNEES--HX CORTISONE SHOTS IN SHOULDERS, ELBOWS, HIP AND KNEES    Current Outpatient Prescriptions on File Prior to Visit  Medication Sig Dispense Refill  . albuterol (PROVENTIL HFA;VENTOLIN HFA) 108 (90 BASE) MCG/ACT inhaler Inhale 1-2 puffs into the lungs every 6 (six) hours as needed for wheezing or  shortness of breath. PROAIR 3 Inhaler 1  . albuterol (PROVENTIL) (2.5 MG/3ML) 0.083% nebulizer solution Take 2.5 mg by nebulization every 6 (six) hours as needed for wheezing or shortness of breath. Pt uses 2-3X daily    . ALPRAZolam (XANAX) 1 MG tablet Take 1 tablet (1 mg total) by mouth 3 (three) times daily as needed for anxiety. (Patient taking differently: Take 0.5 mg by mouth 3 (three) times daily as needed for anxiety. ) 90 tablet 0  . amLODipine (NORVASC) 10 MG tablet Take 10 mg by mouth every morning.     . baclofen (LIORESAL) 10 MG tablet Take 1 tablet (10 mg total) by mouth 2 (two) times daily. 60 each 0  . famotidine (PEPCID) 20 MG tablet Take 20 mg by mouth daily. TAKES AT 4 PM DAILY    . fluticasone (FLONASE) 50 MCG/ACT nasal spray Place 2 sprays into both nostrils daily. 16 g 12  . Fluticasone Furoate-Vilanterol 100-25 MCG/INH AEPB Inhale 1 puff into the lungs daily. BREO ELLIPTA    . loratadine (CLARITIN) 10 MG tablet Take 10 mg by mouth daily as needed for allergies.    . metoprolol succinate (TOPROL-XL) 25 MG 24 hr tablet Take 1 tablet (25 mg total) by mouth daily. 30 tablet 3  . montelukast (SINGULAIR) 10 MG tablet Take 10 mg by mouth at bedtime.    Marland Kitchen  omeprazole (PRILOSEC) 20 MG capsule Take 1 capsule (20 mg total) by mouth daily. 30 capsule 4  . oxyCODONE-acetaminophen (PERCOCET) 10-325 MG per tablet Take 1 tablet by mouth every 8 (eight) hours as needed for pain. 120 tablet 0  . promethazine (PHENERGAN) 12.5 MG tablet Take 1 tablet (12.5 mg total) by mouth every 8 (eight) hours as needed for nausea or vomiting. 30 tablet 0  . tiotropium (SPIRIVA) 18 MCG inhalation capsule Place 18 mcg into inhaler and inhale daily. IN AM     No current facility-administered medications on file prior to visit.    Allergies  Allergen Reactions  . Ivp Dye [Iodinated Diagnostic Agents] Anaphylaxis  . Levaquin [Levofloxacin In D5w] Shortness Of Breath    In addition: sweating, chest pain, and  diarrhea.   . Penicillins Anaphylaxis    Heart stops  . Aspirin Other (See Comments)    Reaction unknown  . Morphine And Related Nausea And Vomiting  . Nsaids Other (See Comments)    Difficulty breathing    Family History  Problem Relation Age of Onset  . Alzheimer's disease Father 4    Deceased  . Stomach cancer Father   . Heart attack Father   . Heart disease Father   . Skin cancer Mother     Facial-Living  . Alcohol abuse Sister     x2  . Mental illness Sister     x2  . Diabetes Maternal Aunt     x2  . Thyroid disease Maternal Aunt     x4  . Diabetes Maternal Uncle   . Tuberculosis Paternal Grandfather   . Tuberculosis Paternal Grandmother   . Alzheimer's disease Paternal Aunt   . Alzheimer's disease Paternal Uncle     History   Social History  . Marital Status: Married    Spouse Name: N/A    Number of Children: N/A  . Years of Education: N/A   Social History Main Topics  . Smoking status: Former Smoker -- 86 years    Quit date: 08/25/2013  . Smokeless tobacco: Never Used     Comment: hx  smoke a pack of pipe tobacco(1.5oz) every 10 days.   . Alcohol Use: 12.6 oz/week    21 Cans of beer per week     Comment: average 3 beers per day  . Drug Use: No  . Sexual Activity: None   Other Topics Concern  . None   Social History Narrative    Review of Systems - See HPI.  All other ROS are negative.  BP 109/67 mmHg  Pulse 64  Temp(Src) 97.7 F (36.5 C) (Oral)  Resp 16  Ht '6\' 1"'  (1.854 m)  Wt 215 lb 2 oz (97.58 kg)  BMI 28.39 kg/m2  SpO2 98%  Physical Exam  Constitutional: He is oriented to person, place, and time and well-developed, well-nourished, and in no distress.  HENT:  Head: Normocephalic and atraumatic.  Eyes: Conjunctivae are normal.  Cardiovascular: Normal rate, regular rhythm, normal heart sounds and intact distal pulses.   Pulmonary/Chest: Effort normal and breath sounds normal. No respiratory distress. He has no wheezes. He has no  rales. He exhibits no tenderness.  Musculoskeletal:       Right hand: He exhibits normal range of motion, no tenderness and no swelling. Normal sensation noted. Normal strength noted.       Left hand: He exhibits normal range of motion, no tenderness and no swelling. Normal sensation noted. Normal strength noted.  Neurological: He is  alert and oriented to person, place, and time.  Skin: Skin is warm and dry. No rash noted.  Psychiatric: Affect normal.  Vitals reviewed.   Recent Results (from the past 2160 hour(s))  Urinalysis, Routine w reflex microscopic     Status: Abnormal   Collection Time: 08/05/14  4:27 PM  Result Value Ref Range   Color, Urine YELLOW YELLOW   APPearance CLEAR CLEAR   Specific Gravity, Urine 1.005 1.005 - 1.030   pH 6.0 5.0 - 8.0   Glucose, UA NEGATIVE NEGATIVE mg/dL   Hgb urine dipstick NEGATIVE NEGATIVE   Bilirubin Urine NEGATIVE NEGATIVE   Ketones, ur NEGATIVE NEGATIVE mg/dL   Protein, ur NEGATIVE NEGATIVE mg/dL   Urobilinogen, UA 0.2 0.0 - 1.0 mg/dL   Nitrite NEGATIVE NEGATIVE   Leukocytes, UA TRACE (A) NEGATIVE  Urine microscopic-add on     Status: None   Collection Time: 08/05/14  4:27 PM  Result Value Ref Range   Squamous Epithelial / LPF RARE RARE   WBC, UA 0-2 <3 WBC/hpf   Bacteria, UA RARE RARE  CBC with Differential     Status: Abnormal   Collection Time: 08/05/14  5:34 PM  Result Value Ref Range   WBC 5.8 4.0 - 10.5 K/uL   RBC 4.20 (L) 4.22 - 5.81 MIL/uL   Hemoglobin 13.1 13.0 - 17.0 g/dL   HCT 39.2 39.0 - 52.0 %   MCV 93.3 78.0 - 100.0 fL   MCH 31.2 26.0 - 34.0 pg   MCHC 33.4 30.0 - 36.0 g/dL   RDW 13.4 11.5 - 15.5 %   Platelets 227 150 - 400 K/uL   Neutrophils Relative % 71 43 - 77 %   Neutro Abs 4.2 1.7 - 7.7 K/uL   Lymphocytes Relative 19 12 - 46 %   Lymphs Abs 1.1 0.7 - 4.0 K/uL   Monocytes Relative 7 3 - 12 %   Monocytes Absolute 0.4 0.1 - 1.0 K/uL   Eosinophils Relative 3 0 - 5 %   Eosinophils Absolute 0.2 0.0 - 0.7 K/uL    Basophils Relative 0 0 - 1 %   Basophils Absolute 0.0 0.0 - 0.1 K/uL  Comprehensive metabolic panel     Status: Abnormal   Collection Time: 08/05/14  5:34 PM  Result Value Ref Range   Sodium 137 137 - 147 mEq/L   Potassium 4.0 3.7 - 5.3 mEq/L   Chloride 96 96 - 112 mEq/L   CO2 25 19 - 32 mEq/L   Glucose, Bld 100 (H) 70 - 99 mg/dL   BUN 16 6 - 23 mg/dL   Creatinine, Ser 1.10 0.50 - 1.35 mg/dL   Calcium 9.7 8.4 - 10.5 mg/dL   Total Protein 7.5 6.0 - 8.3 g/dL   Albumin 4.1 3.5 - 5.2 g/dL   AST 22 0 - 37 U/L   ALT 24 0 - 53 U/L   Alkaline Phosphatase 68 39 - 117 U/L   Total Bilirubin 0.4 0.3 - 1.2 mg/dL   GFR calc non Af Amer 69 (L) >90 mL/min   GFR calc Af Amer 80 (L) >90 mL/min    Comment: (NOTE) The eGFR has been calculated using the CKD EPI equation. This calculation has not been validated in all clinical situations. eGFR's persistently <90 mL/min signify possible Chronic Kidney Disease.   Anion gap 16 (H) 5 - 15  Lipase, blood     Status: None   Collection Time: 08/05/14  5:39 PM  Result Value Ref Range  Lipase 35 11 - 59 U/L  Basic Metabolic Panel (BMET)     Status: None   Collection Time: 08/13/14  3:56 PM  Result Value Ref Range   Sodium 135 135 - 145 mEq/L   Potassium 4.7 3.5 - 5.3 mEq/L   Chloride 104 96 - 112 mEq/L   CO2 21 19 - 32 mEq/L   Glucose, Bld 74 70 - 99 mg/dL   BUN 13 6 - 23 mg/dL   Creat 1.18 0.50 - 1.35 mg/dL   Calcium 9.4 8.4 - 10.5 mg/dL  Hemoglobin A1c     Status: Abnormal   Collection Time: 08/13/14  3:56 PM  Result Value Ref Range   Hgb A1c MFr Bld 6.0 (H) <5.7 %    Comment:                                                                        According to the ADA Clinical Practice Recommendations for 2011, when HbA1c is used as a screening test:     >=6.5%   Diagnostic of Diabetes Mellitus            (if abnormal result is confirmed)   5.7-6.4%   Increased risk of developing Diabetes Mellitus   References:Diagnosis and Classification  of Diabetes Mellitus,Diabetes OECX,5072,25(JDYNX 1):S62-S69 and Standards of Medical Care in         Diabetes - 2011,Diabetes Care,2011,34 (Suppl 1):S11-S61.     Mean Plasma Glucose 126 (H) <117 mg/dL  Osmolality     Status: None   Collection Time: 08/13/14  3:56 PM  Result Value Ref Range   Osmolality 289 275 - 300 mOsm/kg  Osmolality, urine     Status: Abnormal   Collection Time: 08/13/14  3:56 PM  Result Value Ref Range   Osmolality, Ur 211 (L) 390 - 1090 mOsm/kg  Urinalysis, Routine w reflex microscopic     Status: None   Collection Time: 08/13/14  3:56 PM  Result Value Ref Range   Color, Urine YELLOW YELLOW   APPearance CLEAR CLEAR   Specific Gravity, Urine 1.009 1.005 - 1.030   pH 5.0 5.0 - 8.0   Glucose, UA NEG NEG mg/dL   Bilirubin Urine NEG NEG   Ketones, ur NEG NEG mg/dL   Hgb urine dipstick NEG NEG   Protein, ur NEG NEG mg/dL   Urobilinogen, UA 0.2 0.0 - 1.0 mg/dL   Nitrite NEG NEG   Leukocytes, UA NEG NEG  CBC     Status: Abnormal   Collection Time: 08/18/14 11:55 AM  Result Value Ref Range   WBC 6.7 4.0 - 10.5 K/uL   RBC 4.21 (L) 4.22 - 5.81 MIL/uL   Hemoglobin 13.4 13.0 - 17.0 g/dL   HCT 38.3 (L) 39.0 - 52.0 %   MCV 91.0 78.0 - 100.0 fL   MCH 31.8 26.0 - 34.0 pg   MCHC 35.0 30.0 - 36.0 g/dL   RDW 13.2 11.5 - 15.5 %   Platelets 259 150 - 400 K/uL  Comprehensive metabolic panel     Status: Abnormal   Collection Time: 08/18/14 11:55 AM  Result Value Ref Range   Sodium 137 137 - 147 mEq/L   Potassium 4.2 3.7 - 5.3 mEq/L  Chloride 101 96 - 112 mEq/L   CO2 22 19 - 32 mEq/L   Glucose, Bld 102 (H) 70 - 99 mg/dL   BUN 15 6 - 23 mg/dL   Creatinine, Ser 1.10 0.50 - 1.35 mg/dL   Calcium 9.5 8.4 - 10.5 mg/dL   Total Protein 7.4 6.0 - 8.3 g/dL   Albumin 3.9 3.5 - 5.2 g/dL   AST 21 0 - 37 U/L   ALT 26 0 - 53 U/L   Alkaline Phosphatase 76 39 - 117 U/L   Total Bilirubin 0.3 0.3 - 1.2 mg/dL   GFR calc non Af Amer 69 (L) >90 mL/min   GFR calc Af Amer 80 (L) >90  mL/min    Comment: (NOTE) The eGFR has been calculated using the CKD EPI equation. This calculation has not been validated in all clinical situations. eGFR's persistently <90 mL/min signify possible Chronic Kidney Disease.    Anion gap 14 5 - 15  Troponin I (order at [facility])     Status: None   Collection Time: 08/18/14 11:55 AM  Result Value Ref Range   Troponin I <0.30 <0.30 ng/mL    Comment:        Due to the release kinetics of cTnI, a negative result within the first hours of the onset of symptoms does not rule out myocardial infarction with certainty. If myocardial infarction is still suspected, repeat the test at appropriate intervals.   Troponin I-serum (0, 3, 6 hours)     Status: None   Collection Time: 08/18/14  6:17 PM  Result Value Ref Range   Troponin I <0.30 <0.30 ng/mL    Comment:        Due to the release kinetics of cTnI, a negative result within the first hours of the onset of symptoms does not rule out myocardial infarction with certainty. If myocardial infarction is still suspected, repeat the test at appropriate intervals.   CBC     Status: Abnormal   Collection Time: 08/18/14  6:17 PM  Result Value Ref Range   WBC 5.9 4.0 - 10.5 K/uL   RBC 4.12 (L) 4.22 - 5.81 MIL/uL   Hemoglobin 12.7 (L) 13.0 - 17.0 g/dL   HCT 37.1 (L) 39.0 - 52.0 %   MCV 90.0 78.0 - 100.0 fL   MCH 30.8 26.0 - 34.0 pg   MCHC 34.2 30.0 - 36.0 g/dL   RDW 13.5 11.5 - 15.5 %   Platelets 267 150 - 400 K/uL  Creatinine, serum     Status: Abnormal   Collection Time: 08/18/14  6:17 PM  Result Value Ref Range   Creatinine, Ser 1.04 0.50 - 1.35 mg/dL   GFR calc non Af Amer 74 (L) >90 mL/min   GFR calc Af Amer 86 (L) >90 mL/min    Comment: (NOTE) The eGFR has been calculated using the CKD EPI equation. This calculation has not been validated in all clinical situations. eGFR's persistently <90 mL/min signify possible Chronic Kidney Disease.   D-dimer, quantitative     Status: None    Collection Time: 08/18/14  6:17 PM  Result Value Ref Range   D-Dimer, Quant 0.36 0.00 - 0.48 ug/mL-FEU    Comment:        AT THE INHOUSE ESTABLISHED CUTOFF VALUE OF 0.48 ug/mL FEU, THIS ASSAY HAS BEEN DOCUMENTED IN THE LITERATURE TO HAVE A SENSITIVITY AND NEGATIVE PREDICTIVE VALUE OF AT LEAST 98 TO 99%.  THE TEST RESULT SHOULD BE CORRELATED WITH AN ASSESSMENT OF  THE CLINICAL PROBABILITY OF DVT / VTE.   Troponin I-serum (0, 3, 6 hours)     Status: None   Collection Time: 08/18/14  8:25 PM  Result Value Ref Range   Troponin I <0.30 <0.30 ng/mL    Comment:        Due to the release kinetics of cTnI, a negative result within the first hours of the onset of symptoms does not rule out myocardial infarction with certainty. If myocardial infarction is still suspected, repeat the test at appropriate intervals.   Troponin I-serum (0, 3, 6 hours)     Status: None   Collection Time: 08/18/14 11:45 PM  Result Value Ref Range   Troponin I <0.30 <0.30 ng/mL    Comment:        Due to the release kinetics of cTnI, a negative result within the first hours of the onset of symptoms does not rule out myocardial infarction with certainty. If myocardial infarction is still suspected, repeat the test at appropriate intervals.   Urinalysis, Routine w reflex microscopic     Status: None   Collection Time: 08/28/14 12:53 PM  Result Value Ref Range   Color, Urine YELLOW YELLOW   APPearance CLEAR CLEAR   Specific Gravity, Urine 1.014 1.005 - 1.030   pH 5.5 5.0 - 8.0   Glucose, UA NEGATIVE NEGATIVE mg/dL   Hgb urine dipstick NEGATIVE NEGATIVE   Bilirubin Urine NEGATIVE NEGATIVE   Ketones, ur NEGATIVE NEGATIVE mg/dL   Protein, ur NEGATIVE NEGATIVE mg/dL   Urobilinogen, UA 0.2 0.0 - 1.0 mg/dL   Nitrite NEGATIVE NEGATIVE   Leukocytes, UA NEGATIVE NEGATIVE    Comment: MICROSCOPIC NOT DONE ON URINES WITH NEGATIVE PROTEIN, BLOOD, LEUKOCYTES, NITRITE, OR GLUCOSE <1000 mg/dL.  CBC     Status:  Abnormal   Collection Time: 09/01/14  3:08 PM  Result Value Ref Range   WBC 6.5 4.0 - 10.5 K/uL   RBC 3.95 (L) 4.22 - 5.81 Mil/uL   Platelets 230.0 150.0 - 400.0 K/uL   Hemoglobin 12.4 (L) 13.0 - 17.0 g/dL   HCT 38.1 (L) 39.0 - 52.0 %   MCV 96.5 78.0 - 100.0 fl   MCHC 32.7 30.0 - 36.0 g/dL   RDW 15.0 11.5 - 15.5 %  TSH     Status: None   Collection Time: 09/01/14  3:08 PM  Result Value Ref Range   TSH 1.01 0.35 - 4.50 uIU/mL  B12     Status: None   Collection Time: 09/01/14  3:08 PM  Result Value Ref Range   Vitamin B-12 348 211 - 911 pg/mL  B. Burgdorfi Antibodies     Status: None   Collection Time: 09/01/14  3:08 PM  Result Value Ref Range   B burgdorferi Ab IgG+IgM 0.10 ISR    Comment: Antibody to Borrelia burgdorferi not detected.      ISR = Immune Status Ratio              <0.90         ISR       Negative              0.90 - 1.09   ISR       Equivocal              >=1.10        ISR       Positive   T4, free     Status: None   Collection Time: 09/01/14  3:08  PM  Result Value Ref Range   Free T4 0.82 0.60 - 1.60 ng/dL  RPR     Status: None   Collection Time: 09/01/14  3:08 PM  Result Value Ref Range   RPR NON REAC NON REAC  Testosterone, free, total     Status: Abnormal   Collection Time: 09/01/14  3:08 PM  Result Value Ref Range   Testosterone 209 (L) 300 - 890 ng/dL    Comment:           Tanner Stage       Male              Male               I              < 30 ng/dL        < 10 ng/dL               II             < 150 ng/dL       < 30 ng/dL               III            100-320 ng/dL     < 35 ng/dL               IV             200-970 ng/dL     15-40 ng/dL               V/Adult        300-890 ng/dL     10-70 ng/dL      Sex Hormone Binding 62 13 - 71 nmol/L   Testosterone, Free 26.0 (L) 47.0 - 244.0 pg/mL    Comment:   The concentration of free testosterone is derived from a mathematical expression based on constants for the binding of testosterone to  sex hormone-binding globulin and albumin.    Testosterone-% Free 1.2 (L) 1.6 - 2.9 %  CBC     Status: None   Collection Time: 09/23/14 12:20 PM  Result Value Ref Range   WBC 6.4 4.0 - 10.5 K/uL   RBC 4.35 4.22 - 5.81 MIL/uL   Hemoglobin 13.7 13.0 - 17.0 g/dL   HCT 40.9 39.0 - 52.0 %   MCV 94.0 78.0 - 100.0 fL   MCH 31.5 26.0 - 34.0 pg   MCHC 33.5 30.0 - 36.0 g/dL   RDW 13.8 11.5 - 15.5 %   Platelets 268 150 - 400 K/uL  Basic metabolic panel     Status: Abnormal   Collection Time: 09/23/14 12:20 PM  Result Value Ref Range   Sodium 140 137 - 147 mEq/L   Potassium 4.5 3.7 - 5.3 mEq/L   Chloride 103 96 - 112 mEq/L   CO2 25 19 - 32 mEq/L   Glucose, Bld 97 70 - 99 mg/dL   BUN 12 6 - 23 mg/dL   Creatinine, Ser 1.28 0.50 - 1.35 mg/dL   Calcium 9.3 8.4 - 10.5 mg/dL   GFR calc non Af Amer 58 (L) >90 mL/min   GFR calc Af Amer 67 (L) >90 mL/min    Comment: (NOTE) The eGFR has been calculated using the CKD EPI equation. This calculation has not been validated in all clinical situations. eGFR's persistently <90 mL/min signify possible Chronic Kidney Disease.    Anion gap 12 5 -  15  Glucose, capillary     Status: Abnormal   Collection Time: 09/28/14 10:54 AM  Result Value Ref Range   Glucose-Capillary 166 (H) 70 - 99 mg/dL   Comment 1 Notify RN   CBC with Differential     Status: None   Collection Time: 10/01/14  8:25 PM  Result Value Ref Range   WBC 6.9 4.0 - 10.5 K/uL   RBC 4.22 4.22 - 5.81 MIL/uL   Hemoglobin 13.5 13.0 - 17.0 g/dL   HCT 40.6 39.0 - 52.0 %   MCV 96.2 78.0 - 100.0 fL   MCH 32.0 26.0 - 34.0 pg   MCHC 33.3 30.0 - 36.0 g/dL   RDW 14.0 11.5 - 15.5 %   Platelets 242 150 - 400 K/uL   Neutrophils Relative % 60 43 - 77 %   Neutro Abs 4.1 1.7 - 7.7 K/uL   Lymphocytes Relative 25 12 - 46 %   Lymphs Abs 1.7 0.7 - 4.0 K/uL   Monocytes Relative 12 3 - 12 %   Monocytes Absolute 0.9 0.1 - 1.0 K/uL   Eosinophils Relative 3 0 - 5 %   Eosinophils Absolute 0.2 0.0 - 0.7  K/uL   Basophils Relative 0 0 - 1 %   Basophils Absolute 0.0 0.0 - 0.1 K/uL  Comprehensive metabolic panel     Status: Abnormal   Collection Time: 10/01/14  8:25 PM  Result Value Ref Range   Sodium 138 137 - 147 mEq/L   Potassium 4.1 3.7 - 5.3 mEq/L   Chloride 100 96 - 112 mEq/L   CO2 24 19 - 32 mEq/L   Glucose, Bld 92 70 - 99 mg/dL   BUN 18 6 - 23 mg/dL   Creatinine, Ser 1.22 0.50 - 1.35 mg/dL   Calcium 9.4 8.4 - 10.5 mg/dL   Total Protein 7.0 6.0 - 8.3 g/dL   Albumin 3.9 3.5 - 5.2 g/dL   AST 27 0 - 37 U/L   ALT 30 0 - 53 U/L   Alkaline Phosphatase 62 39 - 117 U/L   Total Bilirubin 0.5 0.3 - 1.2 mg/dL   GFR calc non Af Amer 61 (L) >90 mL/min   GFR calc Af Amer 71 (L) >90 mL/min    Comment: (NOTE) The eGFR has been calculated using the CKD EPI equation. This calculation has not been validated in all clinical situations. eGFR's persistently <90 mL/min signify possible Chronic Kidney Disease.    Anion gap 14 5 - 15  Urinalysis, Routine w reflex microscopic     Status: Abnormal   Collection Time: 10/01/14  9:34 PM  Result Value Ref Range   Color, Urine YELLOW YELLOW   APPearance CLOUDY (A) CLEAR   Specific Gravity, Urine 1.018 1.005 - 1.030   pH 5.5 5.0 - 8.0   Glucose, UA NEGATIVE NEGATIVE mg/dL   Hgb urine dipstick LARGE (A) NEGATIVE   Bilirubin Urine NEGATIVE NEGATIVE   Ketones, ur NEGATIVE NEGATIVE mg/dL   Protein, ur 100 (A) NEGATIVE mg/dL   Urobilinogen, UA 0.2 0.0 - 1.0 mg/dL   Nitrite NEGATIVE NEGATIVE   Leukocytes, UA TRACE (A) NEGATIVE  Urine microscopic-add on     Status: Abnormal   Collection Time: 10/01/14  9:34 PM  Result Value Ref Range   Squamous Epithelial / LPF RARE RARE   WBC, UA 0-2 <3 WBC/hpf   RBC / HPF TOO NUMEROUS TO COUNT <3 RBC/hpf   Bacteria, UA RARE RARE   Casts GRANULAR CAST (A)  NEGATIVE  Rheumatoid Factor     Status: None   Collection Time: 10/06/14 11:47 AM  Result Value Ref Range   Rhuematoid fact SerPl-aCnc <10 <=14 IU/mL     Comment:                            Interpretive Table                     Low Positive: 15 - 41 IU/mL                     High Positive:  >= 42 IU/mL    In addition to the RF result, and clinical symptoms including joint  involvement, the 2010 ACR Classification Criteria for  scoring/diagnosing Rheumatoid Arthritis include the results of the  following tests:  CRP (60109), ESR (15010), and CCP (AP[facility]) (32355).  [email or web address]   Cyclic citrul peptide antibody, IgG     Status: None   Collection Time: 10/06/14 11:47 AM  Result Value Ref Range   Cyclic Citrullin Peptide Ab <2.0 0.0 - 5.0 U/mL    Comment:                              Interpretive Table                       Low Positive:  5.1 - 14.9 U/mL                       High Positive:  >= 15.0 U/mL   In addition to the CCP (AP[facility]) result, and clinical symptoms including joint involvement, the 2010 ACR Classification Criteria for scoring/diagnosing Rheumatoid Arthritis include the results of the following tests: RF (35009), CRP (38182), and ESR (15010). [email or web address]/ra/ra_[email or web address]   Urinalysis, Routine w reflex microscopic     Status: Abnormal   Collection Time: 10/11/14  1:10 AM  Result Value Ref Range   Color, Urine RED (A) YELLOW    Comment: BIOCHEMICALS MAY BE AFFECTED BY COLOR   APPearance CLOUDY (A) CLEAR   Specific Gravity, Urine 1.020 1.005 - 1.030   pH 5.5 5.0 - 8.0   Glucose, UA NEGATIVE NEGATIVE mg/dL   Hgb urine dipstick LARGE (A) NEGATIVE   Bilirubin Urine NEGATIVE NEGATIVE   Ketones, ur 15 (A) NEGATIVE mg/dL   Protein, ur 100 (A) NEGATIVE mg/dL   Urobilinogen, UA 0.2 0.0 - 1.0 mg/dL   Nitrite NEGATIVE NEGATIVE   Leukocytes, UA MODERATE (A) NEGATIVE  Urine microscopic-add on     Status: Abnormal   Collection Time: 10/11/14  1:10 AM  Result Value Ref Range   Squamous Epithelial / LPF FEW (A) RARE   WBC, UA 7-10 <3  WBC/hpf   RBC / HPF TOO NUMEROUS TO COUNT <3 RBC/hpf   Bacteria, UA FEW (A) RARE    Assessment/Plan: COPD (chronic obstructive pulmonary disease) Gold C Frequent exacerbations  Suggested to patient that he stay under the care of Dr. Joya Gaskins with [facility], but patient is adamant about referral.  Referral placed to [facility].  Arthritis of both knees Referral to Sports Medicine placed.  Exercises reviewed.  Patient refuses further pain medication at present.  Joint stiffness of multiple sites Will check rheumatoid lab panel today.  If positive, will refer to Rheumatology.  Continue pain medications as directed.  Start Tumeric supplement  to help lower inflammation in the body.

## 2014-10-17 NOTE — Assessment & Plan Note (Signed)
Suggested to patient that he stay under the care of Dr. Joya Gaskins with [facility], but patient is adamant about referral.  Referral placed to [facility].

## 2014-10-18 ENCOUNTER — Ambulatory Visit: Payer: Self-pay | Admitting: Family Medicine

## 2014-10-19 ENCOUNTER — Encounter: Payer: Self-pay | Admitting: Physician Assistant

## 2014-10-19 ENCOUNTER — Ambulatory Visit (INDEPENDENT_AMBULATORY_CARE_PROVIDER_SITE_OTHER): Payer: Medicare Other | Admitting: Physician Assistant

## 2014-10-19 VITALS — BP 111/67 | HR 68 | Temp 97.7°F | Resp 16 | Ht 73.0 in | Wt 214.0 lb

## 2014-10-19 DIAGNOSIS — R310 Gross hematuria: Secondary | ICD-10-CM

## 2014-10-19 NOTE — Patient Instructions (Signed)
Please continue the medications as directed.  Follow-up with Dr. Jeffie Pollock today.  Continue fiber supplement, stool softener and prune juice.  If no improvement, we can consider a prescription medication for chronic constipation.

## 2014-10-19 NOTE — Progress Notes (Signed)
Pre visit review using our clinic review tool, if applicable. No additional management support is needed unless otherwise documented below in the visit note/SLS  

## 2014-10-19 NOTE — Assessment & Plan Note (Signed)
Recent TURP procedure.  Has follow-up today with Urology for assessment and Ultrasound.  Patient encouraged to stop heavy lifting.  Stay well hydrated.  Follow-up with Urology today as scheduled.  I see no need for further workup in office today as patient seeing his specialist this afternoon for this complaint and workup is in process.

## 2014-10-19 NOTE — Progress Notes (Signed)
Patient presents to clinic today for ER follow-up for hematuria.  Patient seen in ER on 10/11/14.  Patient with history of BPH and prostate carcinoma who recently had TURP procedure performed by Dr. Jeffie Pollock on 09/28/14.  Has had persistent hematuria since.  Bleeding is present throughout the urinary stream but much less concentrated at the end of the stream.  ER workup including UA revealed red cloudy urine with moderate leukocytes.  Urology was consulted and recommended patient restart Flomax and placed patient on Doxycycline and instructed to call Dr. Jeffie Pollock for appointment.  Patient states he has taken the doxycycline as directed.  Has spoken with his Urologist, and was told not to restart the Flomax.  Is currently having good urinary output but has a scattered stream. Has appointment today with Urology for further assessment and Ultrasound.  Patient denies excess fatigue, palpitations, lightheadedness or SOB above baseline COPD.  Of note, patient does endorse lifting up to 30 pound objects despite being told not to by his Urologist.  Past Medical History  Diagnosis Date  . Hypertension   . Prostate cancer   . GERD (gastroesophageal reflux disease)   . History of TB (tuberculosis)     1984--  hospitalized for 4 month treatment  . Post-polio syndrome     polio at age 72--PT WAS IN IRON LUNG; PT WAS IN W/C UNTIL AGE 50; STILL HAS WEAKNESS RIGHT SIDE  . History of chronic bronchitis   . History of rheumatic fever   . COPD, frequent exacerbations     pulmologist-  dr Joya Gaskins--  Gold Stage C  . Bladder outlet obstruction   . Anxiety disorder   . History of urinary retention   . Nocturnal oxygen desaturation     USES O2 NIGHTLY  . Prostate cancer   . Complication of anesthesia     DIFFICULT WAKING   . Diabetes mellitus without complication     BODERLINE - DIET CONTROL  . Anxiety   . Shortness of breath dyspnea     RIGHT HEMIDIAPHRAGM ELEVATION - POST POLIO SYNDROME  . Dysrhythmia     PVC'S    . Arthritis     BILATERAL SHOULDERS, ELBOWS AND HANDS AND LEFT HIP AND KNEES--HX CORTISONE SHOTS IN SHOULDERS, ELBOWS, HIP AND KNEES    Current Outpatient Prescriptions on File Prior to Visit  Medication Sig Dispense Refill  . albuterol (PROVENTIL HFA;VENTOLIN HFA) 108 (90 BASE) MCG/ACT inhaler Inhale 1-2 puffs into the lungs every 6 (six) hours as needed for wheezing or shortness of breath. PROAIR 3 Inhaler 1  . albuterol (PROVENTIL) (2.5 MG/3ML) 0.083% nebulizer solution Take 2.5 mg by nebulization every 6 (six) hours as needed for wheezing or shortness of breath. Pt uses 2-3X daily    . ALPRAZolam (XANAX) 1 MG tablet Take 1 tablet (1 mg total) by mouth 3 (three) times daily as needed for anxiety. (Patient taking differently: Take 0.5 mg by mouth 3 (three) times daily as needed for anxiety. ) 90 tablet 0  . amLODipine (NORVASC) 10 MG tablet Take 10 mg by mouth every morning.     . baclofen (LIORESAL) 10 MG tablet Take 1 tablet (10 mg total) by mouth 2 (two) times daily. 60 each 0  . famotidine (PEPCID) 20 MG tablet Take 20 mg by mouth daily. TAKES AT 4 PM DAILY    . fluticasone (FLONASE) 50 MCG/ACT nasal spray Place 2 sprays into both nostrils daily. 16 g 12  . Fluticasone Furoate-Vilanterol 100-25 MCG/INH AEPB Inhale 1  puff into the lungs daily. BREO ELLIPTA    . loratadine (CLARITIN) 10 MG tablet Take 10 mg by mouth daily as needed for allergies.    . metoprolol succinate (TOPROL-XL) 25 MG 24 hr tablet Take 1 tablet (25 mg total) by mouth daily. 30 tablet 3  . montelukast (SINGULAIR) 10 MG tablet Take 10 mg by mouth at bedtime.    Marland Kitchen omeprazole (PRILOSEC) 20 MG capsule Take 1 capsule (20 mg total) by mouth daily. 30 capsule 4  . oxyCODONE-acetaminophen (PERCOCET) 10-325 MG per tablet Take 1 tablet by mouth every 8 (eight) hours as needed for pain. 120 tablet 0  . promethazine (PHENERGAN) 12.5 MG tablet Take 1 tablet (12.5 mg total) by mouth every 8 (eight) hours as needed for nausea or  vomiting. 30 tablet 0  . tiotropium (SPIRIVA) 18 MCG inhalation capsule Place 18 mcg into inhaler and inhale daily. IN AM    . zolpidem (AMBIEN) 5 MG tablet Take 1 tablet (5 mg total) by mouth at bedtime as needed for sleep. 15 tablet 1   No current facility-administered medications on file prior to visit.    Allergies  Allergen Reactions  . Ivp Dye [Iodinated Diagnostic Agents] Anaphylaxis  . Levaquin [Levofloxacin In D5w] Shortness Of Breath    In addition: sweating, chest pain, and diarrhea.   . Penicillins Anaphylaxis    Heart stops  . Aspirin Other (See Comments)    Reaction unknown  . Morphine And Related Nausea And Vomiting  . Nsaids Other (See Comments)    Difficulty breathing    Family History  Problem Relation Age of Onset  . Alzheimer's disease Father 4    Deceased  . Stomach cancer Father   . Heart attack Father   . Heart disease Father   . Skin cancer Mother     Facial-Living  . Alcohol abuse Sister     x2  . Mental illness Sister     x2  . Diabetes Maternal Aunt     x2  . Thyroid disease Maternal Aunt     x4  . Diabetes Maternal Uncle   . Tuberculosis Paternal Grandfather   . Tuberculosis Paternal Grandmother   . Alzheimer's disease Paternal Aunt   . Alzheimer's disease Paternal Uncle     History   Social History  . Marital Status: Married    Spouse Name: N/A    Number of Children: N/A  . Years of Education: N/A   Social History Main Topics  . Smoking status: Former Smoker -- 61 years    Quit date: 08/25/2013  . Smokeless tobacco: Never Used     Comment: hx  smoke a pack of pipe tobacco(1.5oz) every 10 days.   . Alcohol Use: 12.6 oz/week    21 Cans of beer per week     Comment: average 3 beers per day  . Drug Use: No  . Sexual Activity: None   Other Topics Concern  . None   Social History Narrative   Review of Systems - See HPI.  All other ROS are negative.  BP 111/67 mmHg  Pulse 68  Temp(Src) 97.7 F (36.5 C) (Oral)  Resp 16   Ht '6\' 1"'  (1.854 m)  Wt 214 lb (97.07 kg)  BMI 28.24 kg/m2  SpO2 100%  Physical Exam  Constitutional: He is oriented to person, place, and time and well-developed, well-nourished, and in no distress.  HENT:  Head: Normocephalic and atraumatic.  Eyes: Conjunctivae are normal.  Cardiovascular: Normal rate,  regular rhythm, normal heart sounds and intact distal pulses.   Pulmonary/Chest: Effort normal and breath sounds normal. No respiratory distress. He has no wheezes. He has no rales. He exhibits no tenderness.  Neurological: He is alert and oriented to person, place, and time.  Skin: Skin is warm and dry. No rash noted.  Psychiatric: Affect normal.  Vitals reviewed.   Recent Results (from the past 2160 hour(s))  Urinalysis, Routine w reflex microscopic     Status: Abnormal   Collection Time: 08/05/14  4:27 PM  Result Value Ref Range   Color, Urine YELLOW YELLOW   APPearance CLEAR CLEAR   Specific Gravity, Urine 1.005 1.005 - 1.030   pH 6.0 5.0 - 8.0   Glucose, UA NEGATIVE NEGATIVE mg/dL   Hgb urine dipstick NEGATIVE NEGATIVE   Bilirubin Urine NEGATIVE NEGATIVE   Ketones, ur NEGATIVE NEGATIVE mg/dL   Protein, ur NEGATIVE NEGATIVE mg/dL   Urobilinogen, UA 0.2 0.0 - 1.0 mg/dL   Nitrite NEGATIVE NEGATIVE   Leukocytes, UA TRACE (A) NEGATIVE  Urine microscopic-add on     Status: None   Collection Time: 08/05/14  4:27 PM  Result Value Ref Range   Squamous Epithelial / LPF RARE RARE   WBC, UA 0-2 <3 WBC/hpf   Bacteria, UA RARE RARE  CBC with Differential     Status: Abnormal   Collection Time: 08/05/14  5:34 PM  Result Value Ref Range   WBC 5.8 4.0 - 10.5 K/uL   RBC 4.20 (L) 4.22 - 5.81 MIL/uL   Hemoglobin 13.1 13.0 - 17.0 g/dL   HCT 39.2 39.0 - 52.0 %   MCV 93.3 78.0 - 100.0 fL   MCH 31.2 26.0 - 34.0 pg   MCHC 33.4 30.0 - 36.0 g/dL   RDW 13.4 11.5 - 15.5 %   Platelets 227 150 - 400 K/uL   Neutrophils Relative % 71 43 - 77 %   Neutro Abs 4.2 1.7 - 7.7 K/uL   Lymphocytes  Relative 19 12 - 46 %   Lymphs Abs 1.1 0.7 - 4.0 K/uL   Monocytes Relative 7 3 - 12 %   Monocytes Absolute 0.4 0.1 - 1.0 K/uL   Eosinophils Relative 3 0 - 5 %   Eosinophils Absolute 0.2 0.0 - 0.7 K/uL   Basophils Relative 0 0 - 1 %   Basophils Absolute 0.0 0.0 - 0.1 K/uL  Comprehensive metabolic panel     Status: Abnormal   Collection Time: 08/05/14  5:34 PM  Result Value Ref Range   Sodium 137 137 - 147 mEq/L   Potassium 4.0 3.7 - 5.3 mEq/L   Chloride 96 96 - 112 mEq/L   CO2 25 19 - 32 mEq/L   Glucose, Bld 100 (H) 70 - 99 mg/dL   BUN 16 6 - 23 mg/dL   Creatinine, Ser 1.10 0.50 - 1.35 mg/dL   Calcium 9.7 8.4 - 10.5 mg/dL   Total Protein 7.5 6.0 - 8.3 g/dL   Albumin 4.1 3.5 - 5.2 g/dL   AST 22 0 - 37 U/L   ALT 24 0 - 53 U/L   Alkaline Phosphatase 68 39 - 117 U/L   Total Bilirubin 0.4 0.3 - 1.2 mg/dL   GFR calc non Af Amer 69 (L) >90 mL/min   GFR calc Af Amer 80 (L) >90 mL/min    Comment: (NOTE) The eGFR has been calculated using the CKD EPI equation. This calculation has not been validated in all clinical situations. eGFR's persistently <90  mL/min signify possible Chronic Kidney Disease.   Anion gap 16 (H) 5 - 15  Lipase, blood     Status: None   Collection Time: 08/05/14  5:39 PM  Result Value Ref Range   Lipase 35 11 - 59 U/L  Basic Metabolic Panel (BMET)     Status: None   Collection Time: 08/13/14  3:56 PM  Result Value Ref Range   Sodium 135 135 - 145 mEq/L   Potassium 4.7 3.5 - 5.3 mEq/L   Chloride 104 96 - 112 mEq/L   CO2 21 19 - 32 mEq/L   Glucose, Bld 74 70 - 99 mg/dL   BUN 13 6 - 23 mg/dL   Creat 1.18 0.50 - 1.35 mg/dL   Calcium 9.4 8.4 - 10.5 mg/dL  Hemoglobin A1c     Status: Abnormal   Collection Time: 08/13/14  3:56 PM  Result Value Ref Range   Hgb A1c MFr Bld 6.0 (H) <5.7 %    Comment:                                                                        According to the ADA Clinical Practice Recommendations for 2011, when HbA1c is used as a  screening test:     >=6.5%   Diagnostic of Diabetes Mellitus            (if abnormal result is confirmed)   5.7-6.4%   Increased risk of developing Diabetes Mellitus   References:Diagnosis and Classification of Diabetes Mellitus,Diabetes HGDJ,2426,83(MHDQQ 1):S62-S69 and Standards of Medical Care in         Diabetes - 2011,Diabetes Care,2011,34 (Suppl 1):S11-S61.     Mean Plasma Glucose 126 (H) <117 mg/dL  Osmolality     Status: None   Collection Time: 08/13/14  3:56 PM  Result Value Ref Range   Osmolality 289 275 - 300 mOsm/kg  Osmolality, urine     Status: Abnormal   Collection Time: 08/13/14  3:56 PM  Result Value Ref Range   Osmolality, Ur 211 (L) 390 - 1090 mOsm/kg  Urinalysis, Routine w reflex microscopic     Status: None   Collection Time: 08/13/14  3:56 PM  Result Value Ref Range   Color, Urine YELLOW YELLOW   APPearance CLEAR CLEAR   Specific Gravity, Urine 1.009 1.005 - 1.030   pH 5.0 5.0 - 8.0   Glucose, UA NEG NEG mg/dL   Bilirubin Urine NEG NEG   Ketones, ur NEG NEG mg/dL   Hgb urine dipstick NEG NEG   Protein, ur NEG NEG mg/dL   Urobilinogen, UA 0.2 0.0 - 1.0 mg/dL   Nitrite NEG NEG   Leukocytes, UA NEG NEG  CBC     Status: Abnormal   Collection Time: 08/18/14 11:55 AM  Result Value Ref Range   WBC 6.7 4.0 - 10.5 K/uL   RBC 4.21 (L) 4.22 - 5.81 MIL/uL   Hemoglobin 13.4 13.0 - 17.0 g/dL   HCT 38.3 (L) 39.0 - 52.0 %   MCV 91.0 78.0 - 100.0 fL   MCH 31.8 26.0 - 34.0 pg   MCHC 35.0 30.0 - 36.0 g/dL   RDW 13.2 11.5 - 15.5 %   Platelets 259 150 - 400 K/uL  Comprehensive metabolic panel     Status: Abnormal   Collection Time: 08/18/14 11:55 AM  Result Value Ref Range   Sodium 137 137 - 147 mEq/L   Potassium 4.2 3.7 - 5.3 mEq/L   Chloride 101 96 - 112 mEq/L   CO2 22 19 - 32 mEq/L   Glucose, Bld 102 (H) 70 - 99 mg/dL   BUN 15 6 - 23 mg/dL   Creatinine, Ser 1.10 0.50 - 1.35 mg/dL   Calcium 9.5 8.4 - 10.5 mg/dL   Total Protein 7.4 6.0 - 8.3 g/dL   Albumin  3.9 3.5 - 5.2 g/dL   AST 21 0 - 37 U/L   ALT 26 0 - 53 U/L   Alkaline Phosphatase 76 39 - 117 U/L   Total Bilirubin 0.3 0.3 - 1.2 mg/dL   GFR calc non Af Amer 69 (L) >90 mL/min   GFR calc Af Amer 80 (L) >90 mL/min    Comment: (NOTE) The eGFR has been calculated using the CKD EPI equation. This calculation has not been validated in all clinical situations. eGFR's persistently <90 mL/min signify possible Chronic Kidney Disease.    Anion gap 14 5 - 15  Troponin I (order at [facility])     Status: None   Collection Time: 08/18/14 11:55 AM  Result Value Ref Range   Troponin I <0.30 <0.30 ng/mL    Comment:        Due to the release kinetics of cTnI, a negative result within the first hours of the onset of symptoms does not rule out myocardial infarction with certainty. If myocardial infarction is still suspected, repeat the test at appropriate intervals.   Troponin I-serum (0, 3, 6 hours)     Status: None   Collection Time: 08/18/14  6:17 PM  Result Value Ref Range   Troponin I <0.30 <0.30 ng/mL    Comment:        Due to the release kinetics of cTnI, a negative result within the first hours of the onset of symptoms does not rule out myocardial infarction with certainty. If myocardial infarction is still suspected, repeat the test at appropriate intervals.   CBC     Status: Abnormal   Collection Time: 08/18/14  6:17 PM  Result Value Ref Range   WBC 5.9 4.0 - 10.5 K/uL   RBC 4.12 (L) 4.22 - 5.81 MIL/uL   Hemoglobin 12.7 (L) 13.0 - 17.0 g/dL   HCT 37.1 (L) 39.0 - 52.0 %   MCV 90.0 78.0 - 100.0 fL   MCH 30.8 26.0 - 34.0 pg   MCHC 34.2 30.0 - 36.0 g/dL   RDW 13.5 11.5 - 15.5 %   Platelets 267 150 - 400 K/uL  Creatinine, serum     Status: Abnormal   Collection Time: 08/18/14  6:17 PM  Result Value Ref Range   Creatinine, Ser 1.04 0.50 - 1.35 mg/dL   GFR calc non Af Amer 74 (L) >90 mL/min   GFR calc Af Amer 86 (L) >90 mL/min    Comment: (NOTE) The eGFR has been calculated using  the CKD EPI equation. This calculation has not been validated in all clinical situations. eGFR's persistently <90 mL/min signify possible Chronic Kidney Disease.   D-dimer, quantitative     Status: None   Collection Time: 08/18/14  6:17 PM  Result Value Ref Range   D-Dimer, Quant 0.36 0.00 - 0.48 ug/mL-FEU    Comment:        AT THE INHOUSE ESTABLISHED  CUTOFF VALUE OF 0.48 ug/mL FEU, THIS ASSAY HAS BEEN DOCUMENTED IN THE LITERATURE TO HAVE A SENSITIVITY AND NEGATIVE PREDICTIVE VALUE OF AT LEAST 98 TO 99%.  THE TEST RESULT SHOULD BE CORRELATED WITH AN ASSESSMENT OF THE CLINICAL PROBABILITY OF DVT / VTE.   Troponin I-serum (0, 3, 6 hours)     Status: None   Collection Time: 08/18/14  8:25 PM  Result Value Ref Range   Troponin I <0.30 <0.30 ng/mL    Comment:        Due to the release kinetics of cTnI, a negative result within the first hours of the onset of symptoms does not rule out myocardial infarction with certainty. If myocardial infarction is still suspected, repeat the test at appropriate intervals.   Troponin I-serum (0, 3, 6 hours)     Status: None   Collection Time: 08/18/14 11:45 PM  Result Value Ref Range   Troponin I <0.30 <0.30 ng/mL    Comment:        Due to the release kinetics of cTnI, a negative result within the first hours of the onset of symptoms does not rule out myocardial infarction with certainty. If myocardial infarction is still suspected, repeat the test at appropriate intervals.   Urinalysis, Routine w reflex microscopic     Status: None   Collection Time: 08/28/14 12:53 PM  Result Value Ref Range   Color, Urine YELLOW YELLOW   APPearance CLEAR CLEAR   Specific Gravity, Urine 1.014 1.005 - 1.030   pH 5.5 5.0 - 8.0   Glucose, UA NEGATIVE NEGATIVE mg/dL   Hgb urine dipstick NEGATIVE NEGATIVE   Bilirubin Urine NEGATIVE NEGATIVE   Ketones, ur NEGATIVE NEGATIVE mg/dL   Protein, ur NEGATIVE NEGATIVE mg/dL   Urobilinogen, UA 0.2 0.0 - 1.0  mg/dL   Nitrite NEGATIVE NEGATIVE   Leukocytes, UA NEGATIVE NEGATIVE    Comment: MICROSCOPIC NOT DONE ON URINES WITH NEGATIVE PROTEIN, BLOOD, LEUKOCYTES, NITRITE, OR GLUCOSE <1000 mg/dL.  CBC     Status: Abnormal   Collection Time: 09/01/14  3:08 PM  Result Value Ref Range   WBC 6.5 4.0 - 10.5 K/uL   RBC 3.95 (L) 4.22 - 5.81 Mil/uL   Platelets 230.0 150.0 - 400.0 K/uL   Hemoglobin 12.4 (L) 13.0 - 17.0 g/dL   HCT 38.1 (L) 39.0 - 52.0 %   MCV 96.5 78.0 - 100.0 fl   MCHC 32.7 30.0 - 36.0 g/dL   RDW 15.0 11.5 - 15.5 %  TSH     Status: None   Collection Time: 09/01/14  3:08 PM  Result Value Ref Range   TSH 1.01 0.35 - 4.50 uIU/mL  B12     Status: None   Collection Time: 09/01/14  3:08 PM  Result Value Ref Range   Vitamin B-12 348 211 - 911 pg/mL  B. Burgdorfi Antibodies     Status: None   Collection Time: 09/01/14  3:08 PM  Result Value Ref Range   B burgdorferi Ab IgG+IgM 0.10 ISR    Comment: Antibody to Borrelia burgdorferi not detected.      ISR = Immune Status Ratio              <0.90         ISR       Negative              0.90 - 1.09   ISR       Equivocal              >=  1.10        ISR       Positive   T4, free     Status: None   Collection Time: 09/01/14  3:08 PM  Result Value Ref Range   Free T4 0.82 0.60 - 1.60 ng/dL  RPR     Status: None   Collection Time: 09/01/14  3:08 PM  Result Value Ref Range   RPR NON REAC NON REAC  Testosterone, free, total     Status: Abnormal   Collection Time: 09/01/14  3:08 PM  Result Value Ref Range   Testosterone 209 (L) 300 - 890 ng/dL    Comment:           Tanner Stage       Male              Male               I              < 30 ng/dL        < 10 ng/dL               II             < 150 ng/dL       < 30 ng/dL               III            100-320 ng/dL     < 35 ng/dL               IV             200-970 ng/dL     15-40 ng/dL               V/Adult        300-890 ng/dL     10-70 ng/dL      Sex Hormone Binding 62 13 - 71  nmol/L   Testosterone, Free 26.0 (L) 47.0 - 244.0 pg/mL    Comment:   The concentration of free testosterone is derived from a mathematical expression based on constants for the binding of testosterone to sex hormone-binding globulin and albumin.    Testosterone-% Free 1.2 (L) 1.6 - 2.9 %  CBC     Status: None   Collection Time: 09/23/14 12:20 PM  Result Value Ref Range   WBC 6.4 4.0 - 10.5 K/uL   RBC 4.35 4.22 - 5.81 MIL/uL   Hemoglobin 13.7 13.0 - 17.0 g/dL   HCT 40.9 39.0 - 52.0 %   MCV 94.0 78.0 - 100.0 fL   MCH 31.5 26.0 - 34.0 pg   MCHC 33.5 30.0 - 36.0 g/dL   RDW 13.8 11.5 - 15.5 %   Platelets 268 150 - 400 K/uL  Basic metabolic panel     Status: Abnormal   Collection Time: 09/23/14 12:20 PM  Result Value Ref Range   Sodium 140 137 - 147 mEq/L   Potassium 4.5 3.7 - 5.3 mEq/L   Chloride 103 96 - 112 mEq/L   CO2 25 19 - 32 mEq/L   Glucose, Bld 97 70 - 99 mg/dL   BUN 12 6 - 23 mg/dL   Creatinine, Ser 1.28 0.50 - 1.35 mg/dL   Calcium 9.3 8.4 - 10.5 mg/dL   GFR calc non Af Amer 58 (L) >90 mL/min   GFR calc Af Amer 67 (L) >90 mL/min    Comment: (NOTE) The eGFR has  been calculated using the CKD EPI equation. This calculation has not been validated in all clinical situations. eGFR's persistently <90 mL/min signify possible Chronic Kidney Disease.    Anion gap 12 5 - 15  Glucose, capillary     Status: Abnormal   Collection Time: 09/28/14 10:54 AM  Result Value Ref Range   Glucose-Capillary 166 (H) 70 - 99 mg/dL   Comment 1 Notify RN   CBC with Differential     Status: None   Collection Time: 10/01/14  8:25 PM  Result Value Ref Range   WBC 6.9 4.0 - 10.5 K/uL   RBC 4.22 4.22 - 5.81 MIL/uL   Hemoglobin 13.5 13.0 - 17.0 g/dL   HCT 40.6 39.0 - 52.0 %   MCV 96.2 78.0 - 100.0 fL   MCH 32.0 26.0 - 34.0 pg   MCHC 33.3 30.0 - 36.0 g/dL   RDW 14.0 11.5 - 15.5 %   Platelets 242 150 - 400 K/uL   Neutrophils Relative % 60 43 - 77 %   Neutro Abs 4.1 1.7 - 7.7 K/uL    Lymphocytes Relative 25 12 - 46 %   Lymphs Abs 1.7 0.7 - 4.0 K/uL   Monocytes Relative 12 3 - 12 %   Monocytes Absolute 0.9 0.1 - 1.0 K/uL   Eosinophils Relative 3 0 - 5 %   Eosinophils Absolute 0.2 0.0 - 0.7 K/uL   Basophils Relative 0 0 - 1 %   Basophils Absolute 0.0 0.0 - 0.1 K/uL  Comprehensive metabolic panel     Status: Abnormal   Collection Time: 10/01/14  8:25 PM  Result Value Ref Range   Sodium 138 137 - 147 mEq/L   Potassium 4.1 3.7 - 5.3 mEq/L   Chloride 100 96 - 112 mEq/L   CO2 24 19 - 32 mEq/L   Glucose, Bld 92 70 - 99 mg/dL   BUN 18 6 - 23 mg/dL   Creatinine, Ser 1.22 0.50 - 1.35 mg/dL   Calcium 9.4 8.4 - 10.5 mg/dL   Total Protein 7.0 6.0 - 8.3 g/dL   Albumin 3.9 3.5 - 5.2 g/dL   AST 27 0 - 37 U/L   ALT 30 0 - 53 U/L   Alkaline Phosphatase 62 39 - 117 U/L   Total Bilirubin 0.5 0.3 - 1.2 mg/dL   GFR calc non Af Amer 61 (L) >90 mL/min   GFR calc Af Amer 71 (L) >90 mL/min    Comment: (NOTE) The eGFR has been calculated using the CKD EPI equation. This calculation has not been validated in all clinical situations. eGFR's persistently <90 mL/min signify possible Chronic Kidney Disease.    Anion gap 14 5 - 15  Urinalysis, Routine w reflex microscopic     Status: Abnormal   Collection Time: 10/01/14  9:34 PM  Result Value Ref Range   Color, Urine YELLOW YELLOW   APPearance CLOUDY (A) CLEAR   Specific Gravity, Urine 1.018 1.005 - 1.030   pH 5.5 5.0 - 8.0   Glucose, UA NEGATIVE NEGATIVE mg/dL   Hgb urine dipstick LARGE (A) NEGATIVE   Bilirubin Urine NEGATIVE NEGATIVE   Ketones, ur NEGATIVE NEGATIVE mg/dL   Protein, ur 100 (A) NEGATIVE mg/dL   Urobilinogen, UA 0.2 0.0 - 1.0 mg/dL   Nitrite NEGATIVE NEGATIVE   Leukocytes, UA TRACE (A) NEGATIVE  Urine microscopic-add on     Status: Abnormal   Collection Time: 10/01/14  9:34 PM  Result Value Ref Range   Squamous Epithelial /  LPF RARE RARE   WBC, UA 0-2 <3 WBC/hpf   RBC / HPF TOO NUMEROUS TO COUNT <3 RBC/hpf    Bacteria, UA RARE RARE   Casts GRANULAR CAST (A) NEGATIVE  Rheumatoid Factor     Status: None   Collection Time: 10/06/14 11:47 AM  Result Value Ref Range   Rhuematoid fact SerPl-aCnc <10 <=14 IU/mL    Comment:                            Interpretive Table                     Low Positive: 15 - 41 IU/mL                     High Positive:  >= 42 IU/mL    In addition to the RF result, and clinical symptoms including joint  involvement, the 2010 ACR Classification Criteria for  scoring/diagnosing Rheumatoid Arthritis include the results of the  following tests:  CRP (33825), ESR (15010), and CCP (APCA) (05397).  [email or web address]   Cyclic citrul peptide antibody, IgG     Status: None   Collection Time: 10/06/14 11:47 AM  Result Value Ref Range   Cyclic Citrullin Peptide Ab <2.0 0.0 - 5.0 U/mL    Comment:                              Interpretive Table                       Low Positive:  5.1 - 14.9 U/mL                       High Positive:  >= 15.0 U/mL   In addition to the CCP (APCA) result, and clinical symptoms including joint involvement, the 2010 ACR Classification Criteria for scoring/diagnosing Rheumatoid Arthritis include the results of the following tests: RF (17408), CRP (14481), and ESR (15010). [email or web address]/practice/clinical/classification/ra/ra_2010.[email or web address]   Urinalysis, Routine w reflex microscopic     Status: Abnormal   Collection Time: 10/11/14  1:10 AM  Result Value Ref Range   Color, Urine RED (A) YELLOW    Comment: BIOCHEMICALS MAY BE AFFECTED BY COLOR   APPearance CLOUDY (A) CLEAR   Specific Gravity, Urine 1.020 1.005 - 1.030   pH 5.5 5.0 - 8.0   Glucose, UA NEGATIVE NEGATIVE mg/dL   Hgb urine dipstick LARGE (A) NEGATIVE   Bilirubin Urine NEGATIVE NEGATIVE   Ketones, ur 15 (A) NEGATIVE mg/dL   Protein, ur 100 (A) NEGATIVE mg/dL   Urobilinogen, UA 0.2 0.0 - 1.0 mg/dL   Nitrite NEGATIVE NEGATIVE    Leukocytes, UA MODERATE (A) NEGATIVE  Urine microscopic-add on     Status: Abnormal   Collection Time: 10/11/14  1:10 AM  Result Value Ref Range   Squamous Epithelial / LPF FEW (A) RARE   WBC, UA 7-10 <3 WBC/hpf   RBC / HPF TOO NUMEROUS TO COUNT <3 RBC/hpf   Bacteria, UA FEW (A) RARE   Assessment/Plan: Gross hematuria Recent TURP procedure.  Has follow-up today with Urology for assessment and Ultrasound.  Patient encouraged to stop heavy lifting.  Stay well hydrated.  Follow-up with Urology today as scheduled.  I see no need for further workup in office today as patient seeing his specialist this afternoon for this complaint and  workup is in process.

## 2014-10-20 ENCOUNTER — Ambulatory Visit (INDEPENDENT_AMBULATORY_CARE_PROVIDER_SITE_OTHER): Payer: Medicare Other | Admitting: Family Medicine

## 2014-10-20 ENCOUNTER — Encounter: Payer: Self-pay | Admitting: Family Medicine

## 2014-10-20 VITALS — BP 128/66 | HR 59 | Ht 73.0 in | Wt 214.0 lb

## 2014-10-20 DIAGNOSIS — M25562 Pain in left knee: Secondary | ICD-10-CM | POA: Diagnosis not present

## 2014-10-20 MED ORDER — METHYLPREDNISOLONE ACETATE 40 MG/ML IJ SUSP
40.0000 mg | Freq: Once | INTRAMUSCULAR | Status: AC
  Administered 2014-10-20: 40 mg via INTRA_ARTICULAR

## 2014-10-20 NOTE — Patient Instructions (Signed)
You were given a cortisone shot today. If you're not improving with this (or it doesn't last at least 3 months) give [name] a call and we can work on approval for viscosupplementation (the lubrication shots).

## 2014-10-21 DIAGNOSIS — R351 Nocturia: Secondary | ICD-10-CM | POA: Diagnosis not present

## 2014-10-25 ENCOUNTER — Telehealth: Payer: Self-pay | Admitting: Pulmonary Disease

## 2014-10-25 NOTE — Progress Notes (Signed)
Patient ID: [name], male   DOB: [birth year], 64 y.o.   MRN: [id number]  PCP: Leeanne Rio, PA-C  Subjective:   HPI: Patient is a 64 y.o. male here for bilateral knee pain.  9/15: Patient reports for about 8 months he has had medial left knee pain. Associated popping, buckling, and catching. No known injury. No swelling or bruising. Worse with twisting motions. Weight bearing radiographs in May showed only patellar spurring. Been treated by PCP since May for this - not improving.  10/7: Patient comes in to get injection left knee but also because right knee bothering him more now. Had polio in right leg over 60 years ago leaving right leg weaker. Believes right knee popped out and back in on Sunday. Associated with swelling and some pain. Using biofreeze. Feels weaker. No catching or locking.  10/15: Patient reports left knee is improved, down to 5/10 pain but right is now worse at 10/10 level. Using knee immobilizer. Would like a knee brace for his left knee as well.  10/20/14: Patient reports right knee has completely improved. Left knee bothering him again - worse with walking, bending, lifting. Using knee braces which help. No swelling. No catching, locking, giving out. Would like another injection.  Past Medical History  Diagnosis Date  . Hypertension   . Prostate cancer   . GERD (gastroesophageal reflux disease)   . History of TB (tuberculosis)     19 83--  hospitalized for 4 month treatment  . Post-polio syndrome     polio at age 45--PT WAS IN IRON LUNG; PT WAS IN W/C UNTIL AGE 29; STILL HAS WEAKNESS RIGHT SIDE  . History of chronic bronchitis   . History of rheumatic fever   . COPD, frequent exacerbations     pulmologist-  dr Joya Gaskins--  Gold Stage C  . Bladder outlet obstruction   . Anxiety disorder   . History of urinary retention   . Nocturnal oxygen desaturation     USES O2 NIGHTLY  . Prostate cancer   . Complication of anesthesia    DIFFICULT WAKING   . Diabetes mellitus without complication     BODERLINE - DIET CONTROL  . Anxiety   . Shortness of breath dyspnea     RIGHT HEMIDIAPHRAGM ELEVATION - POST POLIO SYNDROME  . Dysrhythmia     PVC'S  . Arthritis     BILATERAL SHOULDERS, ELBOWS AND HANDS AND LEFT HIP AND KNEES--HX CORTISONE SHOTS IN SHOULDERS, ELBOWS, HIP AND KNEES    Current Outpatient Prescriptions on File Prior to Visit  Medication Sig Dispense Refill  . albuterol (PROVENTIL HFA;VENTOLIN HFA) 108 (90 BASE) MCG/ACT inhaler Inhale 1-2 puffs into the lungs every 6 (six) hours as needed for wheezing or shortness of breath. PROAIR 3 Inhaler 1  . albuterol (PROVENTIL) (2.5 MG/3ML) 0.083% nebulizer solution Take 2.5 mg by nebulization every 6 (six) hours as needed for wheezing or shortness of breath. Pt uses 2-3X daily    . ALPRAZolam (XANAX) 1 MG tablet Take 1 tablet (1 mg total) by mouth 3 (three) times daily as needed for anxiety. (Patient taking differently: Take 0.5 mg by mouth 3 (three) times daily as needed for anxiety. ) 90 tablet 0  . amLODipine (NORVASC) 10 MG tablet Take 10 mg by mouth every morning.     . baclofen (LIORESAL) 10 MG tablet Take 1 tablet (10 mg total) by mouth 2 (two) times daily. 60 each 0  . famotidine (PEPCID) 20 MG tablet Take 20  mg by mouth daily. TAKES AT 4 PM DAILY    . fluticasone (FLONASE) 50 MCG/ACT nasal spray Place 2 sprays into both nostrils daily. 16 g 12  . Fluticasone Furoate-Vilanterol 100-25 MCG/INH AEPB Inhale 1 puff into the lungs daily. BREO ELLIPTA    . loratadine (CLARITIN) 10 MG tablet Take 10 mg by mouth daily as needed for allergies.    . metoprolol succinate (TOPROL-XL) 25 MG 24 hr tablet Take 1 tablet (25 mg total) by mouth daily. 30 tablet 3  . montelukast (SINGULAIR) 10 MG tablet Take 10 mg by mouth at bedtime.    Marland Kitchen omeprazole (PRILOSEC) 20 MG capsule Take 1 capsule (20 mg total) by mouth daily. 30 capsule 4  . oxyCODONE-acetaminophen (PERCOCET) 10-325 MG per  tablet Take 1 tablet by mouth every 8 (eight) hours as needed for pain. 120 tablet 0  . promethazine (PHENERGAN) 12.5 MG tablet Take 1 tablet (12.5 mg total) by mouth every 8 (eight) hours as needed for nausea or vomiting. 30 tablet 0  . tiotropium (SPIRIVA) 18 MCG inhalation capsule Place 18 mcg into inhaler and inhale daily. IN AM    . zolpidem (AMBIEN) 5 MG tablet Take 1 tablet (5 mg total) by mouth at bedtime as needed for sleep. 15 tablet 1   No current facility-administered medications on file prior to visit.    Past Surgical History  Procedure Laterality Date  . Other surgical history       Muscle & bone Graft/Polio  . Cystoscopy w/ cystogram/  transrectal ultrasound prostate bx  03-22-2009  . Cardiovascular stress test  06-08-2014  dr Mare Ferrari    normal lexiscan study/  no ischemia/  not gated due to PAC's  . Ureterosopy stone extraction  2000  . Nasal septum surgery  2000  . Shoulder arthroscopy with open rotator cuff repair Bilateral 2013  &  1999    removal spurs and bursectomy  . Laparoscopic cholecystectomy  2013  . Prostate biopsy N/A 09/28/2014    Procedure: PROSTATE ULTRASOUND/BIOPSY;  Surgeon: [name], MD;  Location: [facility];  Service: Urology;  Laterality: N/A;  . Transurethral resection of prostate N/A 09/28/2014    Procedure: TRANSURETHRAL RESECTION OF THE PROSTATE (TURP);  Surgeon: [name], MD;  Location: [facility];  Service: Urology;  Laterality: N/A;    Allergies  Allergen Reactions  . Ivp Dye [Iodinated Diagnostic Agents] Anaphylaxis  . Levaquin [Levofloxacin In D5w] Shortness Of Breath    In addition: sweating, chest pain, and diarrhea.   . Penicillins Anaphylaxis    Heart stops  . Aspirin Other (See Comments)    Reaction unknown  . Morphine And Related Nausea And Vomiting  . Nsaids Other (See Comments)    Difficulty breathing    History   Social History  . Marital Status: Married    Spouse Name: N/A    Number of Children: N/A  . Years of  Education: N/A   Occupational History  . Not on file.   Social History Main Topics  . Smoking status: Former Smoker -- 58 years    Quit date: 08/25/2013  . Smokeless tobacco: Never Used     Comment: hx  smoke a pack of pipe tobacco(1.5oz) every 10 days.   . Alcohol Use: 12.6 oz/week    21 Cans of beer per week     Comment: average 3 beers per day  . Drug Use: No  . Sexual Activity: Not on file   Other Topics Concern  .  Not on file   Social History Narrative    Family History  Problem Relation Age of Onset  . Alzheimer's disease Father 4    Deceased  . Stomach cancer Father   . Heart attack Father   . Heart disease Father   . Skin cancer Mother     Facial-Living  . Alcohol abuse Sister     x2  . Mental illness Sister     x2  . Diabetes Maternal Aunt     x2  . Thyroid disease Maternal Aunt     x4  . Diabetes Maternal Uncle   . Tuberculosis Paternal Grandfather   . Tuberculosis Paternal Grandmother   . Alzheimer's disease Paternal Aunt   . Alzheimer's disease Paternal Uncle     BP 128/66 mmHg  Pulse 59  Ht 6\' 1"  (1.854 m)  Wt 214 lb (97.07 kg)  BMI 28.24 kg/m2  Review of Systems: See HPI above.    Objective:  Physical Exam:  Gen: NAD  Left knee: No gross deformity, ecchymoses, effusion. Mod TTP medial joint line.  Less TTP lateral joint line. FROM. Negative ant/post drawers. Negative valgus/varus testing. Negative lachmanns. Negative medial mcmurrays, apleys.  Negative patellar apprehension, clarkes. NV intact distally.    Assessment & Plan:  1. Left knee pain - MRI showed only mild patellofemoral DJD with a baker's cyst.  Last injection provided relief but worsening again so repeated this.  Knee brace for support.  After informed written consent, patient was seated on exam table. Left knee was prepped with alcohol swab and utilizing anteromedial approach, patient's right knee was injected intraarticularly with 3:1 marcaine: depomedrol. Patient  tolerated the procedure well without immediate complications.

## 2014-10-25 NOTE — Telephone Encounter (Signed)
PW pt. lmomtcb x1 for pt

## 2014-10-25 NOTE — Assessment & Plan Note (Signed)
MRI showed only mild patellofemoral DJD with a baker's cyst.  Last injection provided relief but worsening again so repeated this.  Knee brace for support.  After informed written consent, patient was seated on exam table. Left knee was prepped with alcohol swab and utilizing anteromedial approach, patient's right knee was injected intraarticularly with 3:1 marcaine: depomedrol. Patient tolerated the procedure well without immediate complications.

## 2014-10-26 NOTE — Telephone Encounter (Signed)
LMTCB x2  

## 2014-10-27 MED ORDER — MONTELUKAST SODIUM 10 MG PO TABS: 10.0000 mg | ORAL_TABLET | Freq: Every day | ORAL | Status: DC

## 2014-10-27 NOTE — Telephone Encounter (Signed)
Called and spoke to pt. Pt is requesting a refill for Singulair. This medication has never been filled by [facility], it was scripted by previous pulmonologist.    Dr. Joya Gaskins please advise if ok to fill.

## 2014-10-27 NOTE — Telephone Encounter (Signed)
Called and spoke to pt. Informed pt of the refill. Rx sent to preferred pharmacy. Pt verbalized understanding and denied any further questions or concerns at this time.

## 2014-10-27 NOTE — Telephone Encounter (Signed)
i am ok with refill 

## 2014-10-28 ENCOUNTER — Other Ambulatory Visit: Payer: Self-pay | Admitting: Physician Assistant

## 2014-10-29 NOTE — Telephone Encounter (Signed)
Rx request to pharmacy/SLS  

## 2014-10-31 ENCOUNTER — Emergency Department (HOSPITAL_BASED_OUTPATIENT_CLINIC_OR_DEPARTMENT_OTHER)
Admission: EM | Admit: 2014-10-31 | Discharge: 2014-10-31 | Disposition: A | Discharge status: Home / Self Care | Payer: Medicare Other | Attending: Emergency Medicine | Admitting: Emergency Medicine

## 2014-10-31 ENCOUNTER — Encounter (HOSPITAL_BASED_OUTPATIENT_CLINIC_OR_DEPARTMENT_OTHER): Payer: Self-pay | Admitting: *Deleted

## 2014-10-31 ENCOUNTER — Emergency Department (HOSPITAL_BASED_OUTPATIENT_CLINIC_OR_DEPARTMENT_OTHER): Payer: Medicare Other

## 2014-10-31 DIAGNOSIS — R300 Dysuria: Secondary | ICD-10-CM | POA: Insufficient documentation

## 2014-10-31 DIAGNOSIS — E119 Type 2 diabetes mellitus without complications: Secondary | ICD-10-CM | POA: Diagnosis not present

## 2014-10-31 DIAGNOSIS — K4090 Unilateral inguinal hernia, without obstruction or gangrene, not specified as recurrent: Secondary | ICD-10-CM | POA: Diagnosis not present

## 2014-10-31 DIAGNOSIS — N390 Urinary tract infection, site not specified: Secondary | ICD-10-CM | POA: Insufficient documentation

## 2014-10-31 DIAGNOSIS — Z87891 Personal history of nicotine dependence: Secondary | ICD-10-CM | POA: Insufficient documentation

## 2014-10-31 DIAGNOSIS — I10 Essential (primary) hypertension: Secondary | ICD-10-CM | POA: Insufficient documentation

## 2014-10-31 DIAGNOSIS — B9689 Other specified bacterial agents as the cause of diseases classified elsewhere: Secondary | ICD-10-CM | POA: Diagnosis not present

## 2014-10-31 DIAGNOSIS — Z79899 Other long term (current) drug therapy: Secondary | ICD-10-CM | POA: Insufficient documentation

## 2014-10-31 DIAGNOSIS — K219 Gastro-esophageal reflux disease without esophagitis: Secondary | ICD-10-CM | POA: Insufficient documentation

## 2014-10-31 DIAGNOSIS — Z8546 Personal history of malignant neoplasm of prostate: Secondary | ICD-10-CM | POA: Diagnosis not present

## 2014-10-31 DIAGNOSIS — Z880 Allergy status to penicillin: Secondary | ICD-10-CM | POA: Diagnosis not present

## 2014-10-31 DIAGNOSIS — Z8619 Personal history of other infectious and parasitic diseases: Secondary | ICD-10-CM | POA: Insufficient documentation

## 2014-10-31 DIAGNOSIS — Z8611 Personal history of tuberculosis: Secondary | ICD-10-CM | POA: Insufficient documentation

## 2014-10-31 DIAGNOSIS — R1031 Right lower quadrant pain: Secondary | ICD-10-CM | POA: Diagnosis not present

## 2014-10-31 DIAGNOSIS — R109 Unspecified abdominal pain: Secondary | ICD-10-CM

## 2014-10-31 DIAGNOSIS — F419 Anxiety disorder, unspecified: Secondary | ICD-10-CM | POA: Diagnosis not present

## 2014-10-31 DIAGNOSIS — J449 Chronic obstructive pulmonary disease, unspecified: Secondary | ICD-10-CM | POA: Insufficient documentation

## 2014-10-31 DIAGNOSIS — M1990 Unspecified osteoarthritis, unspecified site: Secondary | ICD-10-CM | POA: Diagnosis not present

## 2014-10-31 LAB — COMPREHENSIVE METABOLIC PANEL
ALK PHOS: 67 U/L (ref 39–117)
ALT: 29 U/L (ref 0–53)
ANION GAP: 5 (ref 5–15)
AST: 25 U/L (ref 0–37)
Albumin: 4.4 g/dL (ref 3.5–5.2)
BUN: 18 mg/dL (ref 6–23)
CHLORIDE: 105 meq/L (ref 96–112)
CO2: 26 mmol/L (ref 19–32)
Calcium: 9 mg/dL (ref 8.4–10.5)
Creatinine, Ser: 1.54 mg/dL — ABNORMAL HIGH (ref 0.50–1.35)
GFR calc non Af Amer: 46 mL/min — ABNORMAL LOW (ref >90)
GFR, EST AFRICAN AMERICAN: 53 mL/min — AB (ref >90)
Glucose, Bld: 105 mg/dL — ABNORMAL HIGH (ref 70–99)
Potassium: 4 mmol/L (ref 3.5–5.1)
Sodium: 136 mmol/L (ref 135–145)
Total Bilirubin: 0.8 mg/dL (ref 0.3–1.2)
Total Protein: 7.6 g/dL (ref 6.0–8.3)

## 2014-10-31 LAB — URINALYSIS, ROUTINE W REFLEX MICROSCOPIC
Bilirubin Urine: NEGATIVE
Glucose, UA: NEGATIVE mg/dL
KETONES UR: 15 mg/dL — AB
Nitrite: NEGATIVE
Protein, ur: 30 mg/dL — AB
Specific Gravity, Urine: 1.021 (ref 1.005–1.030)
UROBILINOGEN UA: 0.2 mg/dL (ref 0.0–1.0)
pH: 5.5 (ref 5.0–8.0)

## 2014-10-31 LAB — CBC
HEMATOCRIT: 41.8 % (ref 39.0–52.0)
HEMOGLOBIN: 14.1 g/dL (ref 13.0–17.0)
MCH: 31.6 pg (ref 26.0–34.0)
MCHC: 33.7 g/dL (ref 30.0–36.0)
MCV: 93.7 fL (ref 78.0–100.0)
Platelets: 265 10*3/uL (ref 150–400)
RBC: 4.46 MIL/uL (ref 4.22–5.81)
RDW: 12.9 % (ref 11.5–15.5)
WBC: 6.9 10*3/uL (ref 4.0–10.5)

## 2014-10-31 LAB — URINE MICROSCOPIC-ADD ON

## 2014-10-31 LAB — LIPASE, BLOOD: Lipase: 37 U/L (ref 11–59)

## 2014-10-31 MED ORDER — HYDROMORPHONE HCL 1 MG/ML IJ SOLN
1.0000 mg | Freq: Once | INTRAMUSCULAR | Status: AC
  Administered 2014-10-31: 1 mg via INTRAVENOUS
  Filled 2014-10-31: qty 1

## 2014-10-31 MED ORDER — SULFAMETHOXAZOLE-TRIMETHOPRIM 800-160 MG PO TABS
1.0000 | ORAL_TABLET | Freq: Once | ORAL | Status: AC
  Administered 2014-10-31: 1 via ORAL
  Filled 2014-10-31: qty 1

## 2014-10-31 MED ORDER — ONDANSETRON HCL 4 MG/2ML IJ SOLN
4.0000 mg | Freq: Once | INTRAMUSCULAR | Status: AC
  Administered 2014-10-31: 4 mg via INTRAVENOUS
  Filled 2014-10-31: qty 2

## 2014-10-31 MED ORDER — SULFAMETHOXAZOLE-TRIMETHOPRIM 800-160 MG PO TABS: 1.0000 | ORAL_TABLET | Freq: Two times a day (BID) | ORAL | Status: DC

## 2014-10-31 MED ORDER — OXYCODONE-ACETAMINOPHEN 5-325 MG PO TABS: 1.0000 | ORAL_TABLET | Freq: Four times a day (QID) | ORAL | Status: DC | PRN

## 2014-10-31 NOTE — ED Notes (Signed)
Pt reports RLQ pain since thursdya- +nausea and constipation, c/o burning with urination

## 2014-10-31 NOTE — ED Provider Notes (Signed)
CSN: [id number]     Arrival date & time 10/31/14  1653 History   This chart was scribed for Threasa Beards, MD by Chester Holstein, ED Scribe. This patient was seen in room MH07/MH07 and the patient's care was started at 5:15 PM.    Chief Complaint  Patient presents with  . Abdominal Pain    Patient is a 64 y.o. male presenting with abdominal pain. The history is provided by the patient. No language interpreter was used.  Abdominal Pain Pain location:  RLQ Pain quality: burning   Pain radiates to:  Does not radiate Pain severity:  Severe Onset quality:  Sudden Duration:  3 days Timing:  Sporadic Progression:  Waxing and waning Chronicity:  Recurrent Context: alcohol use and recent sexual activity   Associated symptoms: constipation, dysuria and nausea   Associated symptoms: no fever and no vomiting     HPI Comments: [name] is a 64 y.o. male with PMHx of HTN, DM, and prostate Ca who presents to the Emergency Department complaining of RLQ abdominal pain with onset a week ago.  Pt notes EtOH use (beer) one day last week before first onset and notes drank again 4 days ago.  He states pain worsened after EtOH use. Pt notes dysuria, nausea, and constipation.  Pt states he used an enema for relief.  Pt had transurethral resection of prostate in December 2015. Pt notes similar pain in past before surgery.  Pt notes allergy to Levaquin, Penicillins, Aspirin, Morphine, NSAIDs, and IVP dye. Pt denies vomiting, fever, difficulty urinating.    Past Medical History  Diagnosis Date  . Hypertension   . Prostate cancer   . GERD (gastroesophageal reflux disease)   . History of TB (tuberculosis)     1984--  hospitalized for 4 month treatment  . Post-polio syndrome     polio at age 6--PT WAS IN IRON LUNG; PT WAS IN W/C UNTIL AGE 83; STILL HAS WEAKNESS RIGHT SIDE  . History of chronic bronchitis   . History of rheumatic fever   . COPD, frequent exacerbations     pulmologist-  dr Joya Gaskins--   Gold Stage C  . Bladder outlet obstruction   . Anxiety disorder   . History of urinary retention   . Nocturnal oxygen desaturation     USES O2 NIGHTLY  . Prostate cancer   . Complication of anesthesia     DIFFICULT WAKING   . Diabetes mellitus without complication     BODERLINE - DIET CONTROL  . Anxiety   . Shortness of breath dyspnea     RIGHT HEMIDIAPHRAGM ELEVATION - POST POLIO SYNDROME  . Dysrhythmia     PVC'S  . Arthritis     BILATERAL SHOULDERS, ELBOWS AND HANDS AND LEFT HIP AND KNEES--HX CORTISONE SHOTS IN SHOULDERS, ELBOWS, HIP AND KNEES   Past Surgical History  Procedure Laterality Date  . Other surgical history       Muscle & bone Graft/Polio  . Cystoscopy w/ cystogram/  transrectal ultrasound prostate bx  03-22-2009  . Cardiovascular stress test  06-08-2014  dr Mare Ferrari    normal lexiscan study/  no ischemia/  not gated due to PAC's  . Ureterosopy stone extraction  2000  . Nasal septum surgery  2000  . Shoulder arthroscopy with open rotator cuff repair Bilateral 2013  &  1999    removal spurs and bursectomy  . Laparoscopic cholecystectomy  2013  . Prostate biopsy N/A 09/28/2014    Procedure: PROSTATE ULTRASOUND/BIOPSY;  Surgeon: [name], MD;  Location: [facility];  Service: Urology;  Laterality: N/A;  . Transurethral resection of prostate N/A 09/28/2014    Procedure: TRANSURETHRAL RESECTION OF THE PROSTATE (TURP);  Surgeon: [name], MD;  Location: [facility];  Service: Urology;  Laterality: N/A;   Family History  Problem Relation Age of Onset  . Alzheimer's disease Father 4    Deceased  . Stomach cancer Father   . Heart attack Father   . Heart disease Father   . Skin cancer Mother     Facial-Living  . Alcohol abuse Sister     x2  . Mental illness Sister     x2  . Diabetes Maternal Aunt     x2  . Thyroid disease Maternal Aunt     x4  . Diabetes Maternal Uncle   . Tuberculosis Paternal Grandfather   . Tuberculosis Paternal Grandmother   .  Alzheimer's disease Paternal Aunt   . Alzheimer's disease Paternal Uncle    History  Substance Use Topics  . Smoking status: Former Smoker -- 56 years    Quit date: 08/25/2013  . Smokeless tobacco: Never Used     Comment: hx  smoke a pack of pipe tobacco(1.5oz) every 10 days.   . Alcohol Use: 12.6 oz/week    21 Cans of beer per week     Comment: average 3 beers per day    Review of Systems  Constitutional: Negative for fever.  Gastrointestinal: Positive for nausea, abdominal pain and constipation. Negative for vomiting.  Genitourinary: Positive for dysuria. Negative for difficulty urinating.  All other systems reviewed and are negative.     Allergies  Ivp dye; Levaquin; Penicillins; Aspirin; Morphine and related; and Nsaids  Home Medications   Prior to Admission medications   Medication Sig Start Date End Date Taking? Authorizing Provider  albuterol (PROVENTIL HFA;VENTOLIN HFA) 108 (90 BASE) MCG/ACT inhaler Inhale 1-2 puffs into the lungs every 6 (six) hours as needed for wheezing or shortness of breath. PROAIR 06/04/14  Yes Brunetta Jeans, PA-C  albuterol (PROVENTIL) (2.5 MG/3ML) 0.083% nebulizer solution Take 2.5 mg by nebulization every 6 (six) hours as needed for wheezing or shortness of breath. Pt uses 2-3X daily   Yes Historical Provider, MD  ALPRAZolam (XANAX) 1 MG tablet Take 1 tablet (1 mg total) by mouth 3 (three) times daily as needed for anxiety. Patient taking differently: Take 0.5 mg by mouth 3 (three) times daily as needed for anxiety.  06/03/14  Yes Brunetta Jeans, PA-C  amLODipine (NORVASC) 10 MG tablet TAKE 1 TABLET (10 MG TOTAL) BY MOUTH DAILY. 10/29/14  Yes Brunetta Jeans, PA-C  baclofen (LIORESAL) 10 MG tablet TAKE 1 TABLET (10 MG TOTAL) BY MOUTH 2 (TWO) TIMES DAILY. 10/29/14  Yes Brunetta Jeans, PA-C  famotidine (PEPCID) 20 MG tablet Take 20 mg by mouth daily. TAKES AT 4 PM DAILY   Yes Historical Provider, MD  fluticasone (FLONASE) 50 MCG/ACT nasal spray  Place 2 sprays into both nostrils daily. 07/08/14  Yes Elsie Stain, MD  Fluticasone Furoate-Vilanterol 100-25 MCG/INH AEPB Inhale 1 puff into the lungs daily. BREO ELLIPTA   Yes Historical Provider, MD  loratadine (CLARITIN) 10 MG tablet Take 10 mg by mouth daily as needed for allergies.   Yes Historical Provider, MD  metoprolol succinate (TOPROL-XL) 25 MG 24 hr tablet Take 1 tablet (25 mg total) by mouth daily. 09/01/14  Yes Brunetta Jeans, PA-C  montelukast (SINGULAIR) 10 MG tablet  Take 1 tablet (10 mg total) by mouth at bedtime. 10/27/14  Yes Elsie Stain, MD  omeprazole (PRILOSEC) 20 MG capsule Take 1 capsule (20 mg total) by mouth daily. 07/08/14  Yes Elsie Stain, MD  promethazine (PHENERGAN) 12.5 MG tablet Take 1 tablet (12.5 mg total) by mouth every 8 (eight) hours as needed for nausea or vomiting. 07/13/14  Yes Brunetta Jeans, PA-C  tamsulosin [facility]) 0.4 MG CAPS capsule  08/09/14  Yes Historical Provider, MD  tiotropium (SPIRIVA) 18 MCG inhalation capsule Place 18 mcg into inhaler and inhale daily. IN AM   Yes Historical Provider, MD  zolpidem (AMBIEN) 5 MG tablet Take 1 tablet (5 mg total) by mouth at bedtime as needed for sleep. 10/06/14  Yes Brunetta Jeans, PA-C  oxyCODONE-acetaminophen (PERCOCET/ROXICET) 5-325 MG per tablet Take 1-2 tablets by mouth every 6 (six) hours as needed for severe pain. 10/31/14   Threasa Beards, MD  sulfamethoxazole-trimethoprim (BACTRIM DS,SEPTRA DS) 800-160 MG per tablet Take 1 tablet by mouth 2 (two) times daily. 10/31/14 11/07/14  Threasa Beards, MD   BP 116/55 mmHg  Pulse 58  Temp(Src) 98.5 F (36.9 C) (Oral)  Resp 18  Ht 6\' 1"  (1.854 m)  Wt 214 lb (97.07 kg)  BMI 28.24 kg/m2  SpO2 97% Physical Exam  Constitutional: He is oriented to person, place, and time. He appears well-developed and well-nourished.  HENT:  Head: Normocephalic.  Eyes: Conjunctivae are normal.  Neck: Normal range of motion. Neck supple.  Cardiovascular:  Normal rate, regular rhythm and normal heart sounds.  Exam reveals no gallop and no friction rub.   No murmur heard. Pulmonary/Chest: Effort normal and breath sounds normal. No respiratory distress. He has no wheezes. He has no rales.  Abdominal: Soft. Bowel sounds are normal. He exhibits no distension. There is tenderness in the right lower quadrant. There is no rebound and no guarding.  Musculoskeletal: Normal range of motion.  Neurological: He is alert and oriented to person, place, and time.  Skin: Skin is warm and dry.  Psychiatric: He has a normal mood and affect. His behavior is normal.  Nursing note and vitals reviewed.   ED Course  Procedures (including critical care time) DIAGNOSTIC STUDIES: Oxygen Saturation is 99% on room air, normal by my interpretation.    COORDINATION OF CARE: 5:20 PM Discussed treatment plan with patient at beside, the patient agrees with the plan and has no further questions at this time.   Labs Review Labs Reviewed  COMPREHENSIVE METABOLIC PANEL - Abnormal; Notable for the following:    Glucose, Bld 105 (*)    Creatinine, Ser 1.54 (*)    GFR calc non Af Amer 46 (*)    GFR calc Af Amer 53 (*)    All other components within normal limits  URINALYSIS, ROUTINE W REFLEX MICROSCOPIC - Abnormal; Notable for the following:    APPearance CLOUDY (*)    Hgb urine dipstick MODERATE (*)    Ketones, ur 15 (*)    Protein, ur 30 (*)    Leukocytes, UA LARGE (*)    All other components within normal limits  URINE MICROSCOPIC-ADD ON - Abnormal; Notable for the following:    Bacteria, UA MANY (*)    All other components within normal limits  URINE CULTURE  CBC  LIPASE, BLOOD    Imaging Review Ct Renal Stone Study  10/31/2014   CLINICAL DATA:  Right lower quadrant pain radiating into the right flank for 1 week.  EXAM: CT ABDOMEN AND PELVIS WITHOUT CONTRAST  TECHNIQUE: Multidetector CT imaging of the abdomen and pelvis was performed following the standard  protocol without IV contrast.  COMPARISON:  CT abdomen and pelvis 02/23/2013, 10/01/2014 and 06/25/2014.  FINDINGS: The lung bases are clear. There is no pleural or pericardial effusion. Heart size is normal.  The patient is status post cholecystectomy. The liver, spleen, adrenal glands and pancreas are unremarkable. There is no hydronephrosis on the right or left. No renal or ureteral stones are seen. Small fluid-filled structure adjacent to the left side of the bladder superiorly is unchanged and could be a bladder diverticulum. The urinary bladder is incompletely distended and its walls appear thickened. The stomach, small and large bowel and appendix appear normal. No lymphadenopathy or fluid. No lytic or sclerotic bony lesion is identified with mild appearing lower lumbar spondylosis noted.  IMPRESSION: Negative for urinary tract stone. No acute finding abdomen or pelvis.  No change in a small cystic structure off the left side of the bladder likely reflecting a diverticulum.  No change in a small fat containing left inguinal hernia.   Electronically Signed   By: Inge Rise M.D.   On: 10/31/2014 17:47     EKG Interpretation None      MDM   Final diagnoses:  Flank pain  Dysuria  UTI (lower urinary tract infection)   Pt presenting with c/o right lower abdominal pain as well as dysuria.  Pt had TURP approx 1 month ago.  Was treated with doxycycline for presumed UTI with hematuria on 12/28 with doxycycline.  Today he has many bacteria and TNTC WBCs in UA.   Ct scan reassuring.  No urine cultures recently to help assist in abx choice.  Will start on bactrim as he was on cipro and doxy recently.  Pt treated with IV pain meds in the ED with some relief of symptoms.  Discharged with strict return precautions.  Pt agreeable with plan.   I personally performed the services described in this documentation, which was scribed in my presence. The recorded information has been reviewed and is  accurate.     Threasa Beards, MD 10/31/14 [phone]

## 2014-10-31 NOTE — Discharge Instructions (Signed)
Return to the ED with any concerns including fever/chills, vomiting and not able to keep down liquids or medications, worsening abdominal pain, decreased level of alertness/lethargy, or any other alarming symptoms

## 2014-11-01 LAB — URINE CULTURE
COLONY COUNT: NO GROWTH
Culture: NO GROWTH

## 2014-11-02 DIAGNOSIS — R109 Unspecified abdominal pain: Secondary | ICD-10-CM | POA: Diagnosis not present

## 2014-11-03 ENCOUNTER — Encounter: Payer: Self-pay | Admitting: Physician Assistant

## 2014-11-03 ENCOUNTER — Telehealth: Payer: Self-pay | Admitting: Physician Assistant

## 2014-11-03 ENCOUNTER — Ambulatory Visit (INDEPENDENT_AMBULATORY_CARE_PROVIDER_SITE_OTHER): Payer: Medicare Other | Admitting: Physician Assistant

## 2014-11-03 VITALS — BP 148/74 | HR 63 | Temp 98.3°F | Resp 18 | Ht 73.0 in | Wt 216.4 lb

## 2014-11-03 DIAGNOSIS — R899 Unspecified abnormal finding in specimens from other organs, systems and tissues: Secondary | ICD-10-CM

## 2014-11-03 DIAGNOSIS — R109 Unspecified abdominal pain: Secondary | ICD-10-CM | POA: Diagnosis not present

## 2014-11-03 DIAGNOSIS — R319 Hematuria, unspecified: Secondary | ICD-10-CM

## 2014-11-03 LAB — COMPREHENSIVE METABOLIC PANEL
ALT: 28 U/L (ref 0–53)
AST: 20 U/L (ref 0–37)
Albumin: 4.1 g/dL (ref 3.5–5.2)
Alkaline Phosphatase: 65 U/L (ref 39–117)
BILIRUBIN TOTAL: 0.3 mg/dL (ref 0.2–1.2)
BUN: 19 mg/dL (ref 6–23)
CHLORIDE: 107 meq/L (ref 96–112)
CO2: 29 mEq/L (ref 19–32)
Calcium: 9.3 mg/dL (ref 8.4–10.5)
Creatinine, Ser: 1.12 mg/dL (ref 0.40–1.50)
GFR: 69.94 mL/min (ref >60.00)
Glucose, Bld: 103 mg/dL — ABNORMAL HIGH (ref 70–99)
Potassium: 4.3 mEq/L (ref 3.5–5.1)
Sodium: 138 mEq/L (ref 135–145)
TOTAL PROTEIN: 6.8 g/dL (ref 6.0–8.3)

## 2014-11-03 LAB — POCT URINALYSIS DIPSTICK
Bilirubin, UA: NEGATIVE
Glucose, UA: NEGATIVE
KETONES UA: NEGATIVE
Nitrite, UA: NEGATIVE
PH UA: 5
Protein, UA: POSITIVE
Spec Grav, UA: >=1.03
Urobilinogen, UA: 1

## 2014-11-03 LAB — CBC
HCT: 40.5 % (ref 39.0–52.0)
HEMOGLOBIN: 13.7 g/dL (ref 13.0–17.0)
MCHC: 33.9 g/dL (ref 30.0–36.0)
MCV: 93 fl (ref 78.0–100.0)
PLATELETS: 254 10*3/uL (ref 150.0–400.0)
RBC: 4.36 Mil/uL (ref 4.22–5.81)
RDW: 13.8 % (ref 11.5–15.5)
WBC: 6.1 10*3/uL (ref 4.0–10.5)

## 2014-11-03 LAB — URINALYSIS, MICROSCOPIC ONLY

## 2014-11-03 NOTE — Patient Instructions (Signed)
Please go to the lab for blood work. I will call you with your results. You will be contacted for an appointment with Nephrology.  Continue BP medications as directed.  Follow-up will be based on results.

## 2014-11-03 NOTE — Progress Notes (Signed)
Pre visit review using our clinic review tool, if applicable. No additional management support is needed unless otherwise documented below in the visit note/SLS  

## 2014-11-03 NOTE — Telephone Encounter (Signed)
Caller name: Latoya from [facility] Relation to pt: Call back number: [phone] Pharmacy:  Reason for call:   Want's to know what OV you need for her to fax over?

## 2014-11-03 NOTE — Telephone Encounter (Signed)
Spoke with LaToya at [facility] and requested the last OV note form 01.19.16 along with labs/SLS

## 2014-11-03 NOTE — Telephone Encounter (Signed)
Medication Detail      Disp Refills Start End     metoprolol succinate (TOPROL-XL) 25 MG 24 hr tablet 30 tablet 3 09/01/2014     Sig - Route: Take 1 tablet (25 mg total) by mouth daily. - Oral    E-Prescribing Status: Receipt confirmed by pharmacy (09/01/2014 3:02 PM EST)     Associated Diagnoses    Essential hypertension       Pharmacy    [id number] - [place], [place] - [address]   Current Rx has available refills/SLS

## 2014-11-03 NOTE — Telephone Encounter (Signed)
Caller name: Hendrick, Pavich Relation to pt: self  Call back number: [phone] Pharmacy:  Reason for call:  Pt requesting a refill metoprolol succinate (TOPROL-XL) 25 MG 24 hr tablet

## 2014-11-03 NOTE — Telephone Encounter (Signed)
Discussed lab results with patient.  Have reviewed Urologist results with patient.  Agree with assessment that pain is MSK in nature.  Advised patient to follow instruction given by specialist and take Flexeril as directed.

## 2014-11-04 ENCOUNTER — Other Ambulatory Visit: Payer: Self-pay | Admitting: Physician Assistant

## 2014-11-05 ENCOUNTER — Inpatient Hospital Stay (HOSPITAL_COMMUNITY): Admission: RE | Admit: 2014-11-05 | Payer: Self-pay | Source: Ambulatory Visit

## 2014-11-05 LAB — CULTURE, URINE COMPREHENSIVE
COLONY COUNT: NO GROWTH
ORGANISM ID, BACTERIA: NO GROWTH

## 2014-11-07 DIAGNOSIS — R109 Unspecified abdominal pain: Secondary | ICD-10-CM | POA: Insufficient documentation

## 2014-11-07 NOTE — Progress Notes (Signed)
Patient presents to clinic today for ER follow-up of hematuria and UTI.  Patient proceeded to [facility] ER on 10/31/14 with complaints of intermittent RLQ pain and urinary urgency.  Patient recently underwent TURP procedure for prostate carcinoma.  Workup in ER revealed RBCs and EBCs in urine.  Was sent for culture and patient placed on Bactrim with instruction to follow-up with his urologist, Dr. Jeffie Pollock.  Patient evaluated by Urology yesterday with negative workup.  Was told to stop Bactrim.  Urine findings were normal for someone who underwent recent TURP procedure.  Urine culture from ER was negative for growth.  Patient states he was told by Urology that his symptoms were MSK in nature.  Was given Rx for Flexeril but patient has not taken.  Patient wants to know if he has a UTI or not since his Urologist and ER MD had different diagnoses. Patient still with intermittent RLQ pain.  Endorses heavy lifting and recent intercourse just prior to symptom onset.  Denies visible hematuria.  Denies fever, chills, nausea or vomiting.   Past Medical History  Diagnosis Date  . Hypertension   . Prostate cancer   . GERD (gastroesophageal reflux disease)   . History of TB (tuberculosis)     1984--  hospitalized for 4 month treatment  . Post-polio syndrome     polio at age 74--PT WAS IN IRON LUNG; PT WAS IN W/C UNTIL AGE 19; STILL HAS WEAKNESS RIGHT SIDE  . History of chronic bronchitis   . History of rheumatic fever   . COPD, frequent exacerbations     pulmologist-  dr Joya Gaskins--  Gold Stage C  . Bladder outlet obstruction   . Anxiety disorder   . History of urinary retention   . Nocturnal oxygen desaturation     USES O2 NIGHTLY  . Prostate cancer   . Complication of anesthesia     DIFFICULT WAKING   . Diabetes mellitus without complication     BODERLINE - DIET CONTROL  . Anxiety   . Shortness of breath dyspnea     RIGHT HEMIDIAPHRAGM ELEVATION - POST POLIO SYNDROME  . Dysrhythmia     PVC'S  .  Arthritis     BILATERAL SHOULDERS, ELBOWS AND HANDS AND LEFT HIP AND KNEES--HX CORTISONE SHOTS IN SHOULDERS, ELBOWS, HIP AND KNEES    Current Outpatient Prescriptions on File Prior to Visit  Medication Sig Dispense Refill  . albuterol (PROVENTIL HFA;VENTOLIN HFA) 108 (90 BASE) MCG/ACT inhaler Inhale 1-2 puffs into the lungs every 6 (six) hours as needed for wheezing or shortness of breath. PROAIR 3 Inhaler 1  . albuterol (PROVENTIL) (2.5 MG/3ML) 0.083% nebulizer solution Take 2.5 mg by nebulization every 6 (six) hours as needed for wheezing or shortness of breath. Pt uses 2-3X daily    . ALPRAZolam (XANAX) 1 MG tablet Take 1 tablet (1 mg total) by mouth 3 (three) times daily as needed for anxiety. (Patient taking differently: Take 0.5 mg by mouth 3 (three) times daily as needed for anxiety. ) 90 tablet 0  . amLODipine (NORVASC) 10 MG tablet TAKE 1 TABLET (10 MG TOTAL) BY MOUTH DAILY. 90 tablet 0  . baclofen (LIORESAL) 10 MG tablet TAKE 1 TABLET (10 MG TOTAL) BY MOUTH 2 (TWO) TIMES DAILY. 60 tablet 2  . famotidine (PEPCID) 20 MG tablet Take 20 mg by mouth daily. TAKES AT 4 PM DAILY    . fluticasone (FLONASE) 50 MCG/ACT nasal spray Place 2 sprays into both nostrils daily. 16 g 12  .  Fluticasone Furoate-Vilanterol 100-25 MCG/INH AEPB Inhale 1 puff into the lungs daily. BREO ELLIPTA    . loratadine (CLARITIN) 10 MG tablet Take 10 mg by mouth daily as needed for allergies.    . metoprolol succinate (TOPROL-XL) 25 MG 24 hr tablet Take 1 tablet (25 mg total) by mouth daily. 30 tablet 3  . montelukast (SINGULAIR) 10 MG tablet Take 1 tablet (10 mg total) by mouth at bedtime. 30 tablet 5  . omeprazole (PRILOSEC) 20 MG capsule Take 1 capsule (20 mg total) by mouth daily. 30 capsule 4  . oxyCODONE-acetaminophen (PERCOCET/ROXICET) 5-325 MG per tablet Take 1-2 tablets by mouth every 6 (six) hours as needed for severe pain. 15 tablet 0  . promethazine (PHENERGAN) 12.5 MG tablet Take 1 tablet (12.5 mg total) by  mouth every 8 (eight) hours as needed for nausea or vomiting. 30 tablet 0  . tamsulosin (FLOMAX) 0.4 MG CAPS capsule   2  . tiotropium (SPIRIVA) 18 MCG inhalation capsule Place 18 mcg into inhaler and inhale daily. IN AM    . zolpidem (AMBIEN) 5 MG tablet Take 1 tablet (5 mg total) by mouth at bedtime as needed for sleep. 15 tablet 1   No current facility-administered medications on file prior to visit.    Allergies  Allergen Reactions  . Ivp Dye [Iodinated Diagnostic Agents] Anaphylaxis  . Levaquin [Levofloxacin In D5w] Shortness Of Breath    In addition: sweating, chest pain, and diarrhea.   . Penicillins Anaphylaxis    Heart stops  . Aspirin Other (See Comments)    Reaction unknown  . Morphine And Related Nausea And Vomiting  . Nsaids Other (See Comments)    Difficulty breathing    Family History  Problem Relation Age of Onset  . Alzheimer's disease Father 4    Deceased  . Stomach cancer Father   . Heart attack Father   . Heart disease Father   . Skin cancer Mother     Facial-Living  . Alcohol abuse Sister     x2  . Mental illness Sister     x2  . Diabetes Maternal Aunt     x2  . Thyroid disease Maternal Aunt     x4  . Diabetes Maternal Uncle   . Tuberculosis Paternal Grandfather   . Tuberculosis Paternal Grandmother   . Alzheimer's disease Paternal Aunt   . Alzheimer's disease Paternal Uncle     History   Social History  . Marital Status: Married    Spouse Name: N/A    Number of Children: N/A  . Years of Education: N/A   Social History Main Topics  . Smoking status: Former Smoker -- 5 years    Quit date: 08/25/2013  . Smokeless tobacco: Never Used     Comment: hx  smoke a pack of pipe tobacco(1.5oz) every 10 days.   . Alcohol Use: 12.6 oz/week    21 Cans of beer per week     Comment: average 3 beers per day  . Drug Use: No  . Sexual Activity: None   Other Topics Concern  . None   Social History Narrative    Review of Systems - See HPI.  All  other ROS are negative.  BP 148/74 mmHg  Pulse 63  Temp(Src) 98.3 F (36.8 C) (Oral)  Resp 18  Ht '6\' 1"'  (1.854 m)  Wt 216 lb 6 oz (98.147 kg)  BMI 28.55 kg/m2  SpO2 100%  Physical Exam  Constitutional: He is oriented to person,  place, and time and well-developed, well-nourished, and in no distress.  HENT:  Head: Normocephalic and atraumatic.  Cardiovascular: Normal rate, regular rhythm, normal heart sounds and intact distal pulses.   Pulmonary/Chest: Effort normal and breath sounds normal. No respiratory distress. He has no wheezes. He has no rales. He exhibits no tenderness.  Abdominal: Soft. Bowel sounds are normal. He exhibits no distension and no mass. There is no tenderness. There is no rebound and no guarding.  R-sided pain with ROM.  None with palpation of abdomen.  Negative CVA tenderness.  Neurological: He is alert and oriented to person, place, and time.  Skin: Skin is warm and dry. No rash noted.  Psychiatric: Affect normal.  Vitals reviewed.   Recent Results (from the past 2160 hour(s))  Basic Metabolic Panel (BMET)     Status: None   Collection Time: 08/13/14  3:56 PM  Result Value Ref Range   Sodium 135 135 - 145 mEq/L   Potassium 4.7 3.5 - 5.3 mEq/L   Chloride 104 96 - 112 mEq/L   CO2 21 19 - 32 mEq/L   Glucose, Bld 74 70 - 99 mg/dL   BUN 13 6 - 23 mg/dL   Creat 1.18 0.50 - 1.35 mg/dL   Calcium 9.4 8.4 - 10.5 mg/dL  Hemoglobin A1c     Status: Abnormal   Collection Time: 08/13/14  3:56 PM  Result Value Ref Range   Hgb A1c MFr Bld 6.0 (H) <5.7 %    Comment:                                                                        According to the ADA Clinical Practice Recommendations for 2011, when HbA1c is used as a screening test:     >=6.5%   Diagnostic of Diabetes Mellitus            (if abnormal result is confirmed)   5.7-6.4%   Increased risk of developing Diabetes Mellitus   References:Diagnosis and Classification of Diabetes  [place],Diabetes IEPP,2951,88(CZYSA 1):S62-S69 and Standards of Medical Care in         Diabetes - 2011,Diabetes Care,2011,34 (Suppl 1):S11-S61.     Mean Plasma Glucose 126 (H) <117 mg/dL  Osmolality     Status: None   Collection Time: 08/13/14  3:56 PM  Result Value Ref Range   Osmolality 289 275 - 300 mOsm/kg  Osmolality, urine     Status: Abnormal   Collection Time: 08/13/14  3:56 PM  Result Value Ref Range   Osmolality, Ur 211 (L) 390 - 1090 mOsm/kg  Urinalysis, Routine w reflex microscopic     Status: None   Collection Time: 08/13/14  3:56 PM  Result Value Ref Range   Color, Urine YELLOW YELLOW   APPearance CLEAR CLEAR   Specific Gravity, Urine 1.009 1.005 - 1.030   pH 5.0 5.0 - 8.0   Glucose, UA NEG NEG mg/dL   Bilirubin Urine NEG NEG   Ketones, ur NEG NEG mg/dL   Hgb urine dipstick NEG NEG   Protein, ur NEG NEG mg/dL   Urobilinogen, UA 0.2 0.0 - 1.0 mg/dL   Nitrite NEG NEG   Leukocytes, UA NEG NEG  CBC     Status: Abnormal  Collection Time: 08/18/14 11:55 AM  Result Value Ref Range   WBC 6.7 4.0 - 10.5 K/uL   RBC 4.21 (L) 4.22 - 5.81 MIL/uL   Hemoglobin 13.4 13.0 - 17.0 g/dL   HCT 38.3 (L) 39.0 - 52.0 %   MCV 91.0 78.0 - 100.0 fL   MCH 31.8 26.0 - 34.0 pg   MCHC 35.0 30.0 - 36.0 g/dL   RDW 13.2 11.5 - 15.5 %   Platelets 259 150 - 400 K/uL  Comprehensive metabolic panel     Status: Abnormal   Collection Time: 08/18/14 11:55 AM  Result Value Ref Range   Sodium 137 137 - 147 mEq/L   Potassium 4.2 3.7 - 5.3 mEq/L   Chloride 101 96 - 112 mEq/L   CO2 22 19 - 32 mEq/L   Glucose, Bld 102 (H) 70 - 99 mg/dL   BUN 15 6 - 23 mg/dL   Creatinine, Ser 1.10 0.50 - 1.35 mg/dL   Calcium 9.5 8.4 - 10.5 mg/dL   Total Protein 7.4 6.0 - 8.3 g/dL   Albumin 3.9 3.5 - 5.2 g/dL   AST 21 0 - 37 U/L   ALT 26 0 - 53 U/L   Alkaline Phosphatase 76 39 - 117 U/L   Total Bilirubin 0.3 0.3 - 1.2 mg/dL   GFR calc non Af Amer 69 (L) >90 mL/min   GFR calc Af Amer 80 (L) >90 mL/min     Comment: (NOTE) The eGFR has been calculated using the CKD EPI equation. This calculation has not been validated in all clinical situations. eGFR's persistently <90 mL/min signify possible Chronic Kidney Disease.    Anion gap 14 5 - 15  Troponin I (order at [facility])     Status: None   Collection Time: 08/18/14 11:55 AM  Result Value Ref Range   Troponin I <0.30 <0.30 ng/mL    Comment:        Due to the release kinetics of cTnI, a negative result within the first hours of the onset of symptoms does not rule out myocardial infarction with certainty. If myocardial infarction is still suspected, repeat the test at appropriate intervals.   Troponin I-serum (0, 3, 6 hours)     Status: None   Collection Time: 08/18/14  6:17 PM  Result Value Ref Range   Troponin I <0.30 <0.30 ng/mL    Comment:        Due to the release kinetics of cTnI, a negative result within the first hours of the onset of symptoms does not rule out myocardial infarction with certainty. If myocardial infarction is still suspected, repeat the test at appropriate intervals.   CBC     Status: Abnormal   Collection Time: 08/18/14  6:17 PM  Result Value Ref Range   WBC 5.9 4.0 - 10.5 K/uL   RBC 4.12 (L) 4.22 - 5.81 MIL/uL   Hemoglobin 12.7 (L) 13.0 - 17.0 g/dL   HCT 37.1 (L) 39.0 - 52.0 %   MCV 90.0 78.0 - 100.0 fL   MCH 30.8 26.0 - 34.0 pg   MCHC 34.2 30.0 - 36.0 g/dL   RDW 13.5 11.5 - 15.5 %   Platelets 267 150 - 400 K/uL  Creatinine, serum     Status: Abnormal   Collection Time: 08/18/14  6:17 PM  Result Value Ref Range   Creatinine, Ser 1.04 0.50 - 1.35 mg/dL   GFR calc non Af Amer 74 (L) >90 mL/min   GFR calc Af Amer 86 (  L) >90 mL/min    Comment: (NOTE) The eGFR has been calculated using the CKD EPI equation. This calculation has not been validated in all clinical situations. eGFR's persistently <90 mL/min signify possible Chronic Kidney Disease.   D-dimer, quantitative     Status: None   Collection  Time: 08/18/14  6:17 PM  Result Value Ref Range   D-Dimer, Quant 0.36 0.00 - 0.48 ug/mL-FEU    Comment:        AT THE INHOUSE ESTABLISHED CUTOFF VALUE OF 0.48 ug/mL FEU, THIS ASSAY HAS BEEN DOCUMENTED IN THE LITERATURE TO HAVE A SENSITIVITY AND NEGATIVE PREDICTIVE VALUE OF AT LEAST 98 TO 99%.  THE TEST RESULT SHOULD BE CORRELATED WITH AN ASSESSMENT OF THE CLINICAL PROBABILITY OF DVT / VTE.   Troponin I-serum (0, 3, 6 hours)     Status: None   Collection Time: 08/18/14  8:25 PM  Result Value Ref Range   Troponin I <0.30 <0.30 ng/mL    Comment:        Due to the release kinetics of cTnI, a negative result within the first hours of the onset of symptoms does not rule out myocardial infarction with certainty. If myocardial infarction is still suspected, repeat the test at appropriate intervals.   Troponin I-serum (0, 3, 6 hours)     Status: None   Collection Time: 08/18/14 11:45 PM  Result Value Ref Range   Troponin I <0.30 <0.30 ng/mL    Comment:        Due to the release kinetics of cTnI, a negative result within the first hours of the onset of symptoms does not rule out myocardial infarction with certainty. If myocardial infarction is still suspected, repeat the test at appropriate intervals.   Urinalysis, Routine w reflex microscopic     Status: None   Collection Time: 08/28/14 12:53 PM  Result Value Ref Range   Color, Urine YELLOW YELLOW   APPearance CLEAR CLEAR   Specific Gravity, Urine 1.014 1.005 - 1.030   pH 5.5 5.0 - 8.0   Glucose, UA NEGATIVE NEGATIVE mg/dL   Hgb urine dipstick NEGATIVE NEGATIVE   Bilirubin Urine NEGATIVE NEGATIVE   Ketones, ur NEGATIVE NEGATIVE mg/dL   Protein, ur NEGATIVE NEGATIVE mg/dL   Urobilinogen, UA 0.2 0.0 - 1.0 mg/dL   Nitrite NEGATIVE NEGATIVE   Leukocytes, UA NEGATIVE NEGATIVE    Comment: MICROSCOPIC NOT DONE ON URINES WITH NEGATIVE PROTEIN, BLOOD, LEUKOCYTES, NITRITE, OR GLUCOSE <1000 mg/dL.  CBC     Status: Abnormal    Collection Time: 09/01/14  3:08 PM  Result Value Ref Range   WBC 6.5 4.0 - 10.5 K/uL   RBC 3.95 (L) 4.22 - 5.81 Mil/uL   Platelets 230.0 150.0 - 400.0 K/uL   Hemoglobin 12.4 (L) 13.0 - 17.0 g/dL   HCT 38.1 (L) 39.0 - 52.0 %   MCV 96.5 78.0 - 100.0 fl   MCHC 32.7 30.0 - 36.0 g/dL   RDW 15.0 11.5 - 15.5 %  TSH     Status: None   Collection Time: 09/01/14  3:08 PM  Result Value Ref Range   TSH 1.01 0.35 - 4.50 uIU/mL  B12     Status: None   Collection Time: 09/01/14  3:08 PM  Result Value Ref Range   Vitamin B-12 348 211 - 911 pg/mL  B. Burgdorfi Antibodies     Status: None   Collection Time: 09/01/14  3:08 PM  Result Value Ref Range   B burgdorferi Ab IgG+IgM  0.10 ISR    Comment: Antibody to Borrelia burgdorferi not detected.      ISR = Immune Status Ratio              <0.90         ISR       Negative              0.90 - 1.09   ISR       Equivocal              >=1.10        ISR       Positive   T4, free     Status: None   Collection Time: 09/01/14  3:08 PM  Result Value Ref Range   Free T4 0.82 0.60 - 1.60 ng/dL  RPR     Status: None   Collection Time: 09/01/14  3:08 PM  Result Value Ref Range   RPR Ser Ql NON REAC NON REAC  Testosterone, free, total     Status: Abnormal   Collection Time: 09/01/14  3:08 PM  Result Value Ref Range   Testosterone 209 (L) 300 - 890 ng/dL    Comment:           Tanner Stage       Male              Male               I              < 30 ng/dL        < 10 ng/dL               II             < 150 ng/dL       < 30 ng/dL               III            100-320 ng/dL     < 35 ng/dL               IV             200-970 ng/dL     15-40 ng/dL               V/Adult        300-890 ng/dL     10-70 ng/dL      Sex Hormone Binding 62 13 - 71 nmol/L   Testosterone, Free 26.0 (L) 47.0 - 244.0 pg/mL    Comment:   The concentration of free testosterone is derived from a mathematical expression based on constants for the binding of testosterone to  sex hormone-binding globulin and albumin.    Testosterone-% Free 1.2 (L) 1.6 - 2.9 %  CBC     Status: None   Collection Time: 09/23/14 12:20 PM  Result Value Ref Range   WBC 6.4 4.0 - 10.5 K/uL   RBC 4.35 4.22 - 5.81 MIL/uL   Hemoglobin 13.7 13.0 - 17.0 g/dL   HCT 40.9 39.0 - 52.0 %   MCV 94.0 78.0 - 100.0 fL   MCH 31.5 26.0 - 34.0 pg   MCHC 33.5 30.0 - 36.0 g/dL   RDW 13.8 11.5 - 15.5 %   Platelets 268 150 - 400 K/uL  Basic metabolic panel     Status: Abnormal   Collection Time: 09/23/14 12:20 PM  Result Value Ref Range  Sodium 140 137 - 147 mEq/L   Potassium 4.5 3.7 - 5.3 mEq/L   Chloride 103 96 - 112 mEq/L   CO2 25 19 - 32 mEq/L   Glucose, Bld 97 70 - 99 mg/dL   BUN 12 6 - 23 mg/dL   Creatinine, Ser 1.28 0.50 - 1.35 mg/dL   Calcium 9.3 8.4 - 10.5 mg/dL   GFR calc non Af Amer 58 (L) >90 mL/min   GFR calc Af Amer 67 (L) >90 mL/min    Comment: (NOTE) The eGFR has been calculated using the CKD EPI equation. This calculation has not been validated in all clinical situations. eGFR's persistently <90 mL/min signify possible Chronic Kidney Disease.    Anion gap 12 5 - 15  Glucose, capillary     Status: Abnormal   Collection Time: 09/28/14 10:54 AM  Result Value Ref Range   Glucose-Capillary 166 (H) 70 - 99 mg/dL   Comment 1 Notify RN   CBC with Differential     Status: None   Collection Time: 10/01/14  8:25 PM  Result Value Ref Range   WBC 6.9 4.0 - 10.5 K/uL   RBC 4.22 4.22 - 5.81 MIL/uL   Hemoglobin 13.5 13.0 - 17.0 g/dL   HCT 40.6 39.0 - 52.0 %   MCV 96.2 78.0 - 100.0 fL   MCH 32.0 26.0 - 34.0 pg   MCHC 33.3 30.0 - 36.0 g/dL   RDW 14.0 11.5 - 15.5 %   Platelets 242 150 - 400 K/uL   Neutrophils Relative % 60 43 - 77 %   Neutro Abs 4.1 1.7 - 7.7 K/uL   Lymphocytes Relative 25 12 - 46 %   Lymphs Abs 1.7 0.7 - 4.0 K/uL   Monocytes Relative 12 3 - 12 %   Monocytes Absolute 0.9 0.1 - 1.0 K/uL   Eosinophils Relative 3 0 - 5 %   Eosinophils Absolute 0.2 0.0 - 0.7  K/uL   Basophils Relative 0 0 - 1 %   Basophils Absolute 0.0 0.0 - 0.1 K/uL  Comprehensive metabolic panel     Status: Abnormal   Collection Time: 10/01/14  8:25 PM  Result Value Ref Range   Sodium 138 137 - 147 mEq/L   Potassium 4.1 3.7 - 5.3 mEq/L   Chloride 100 96 - 112 mEq/L   CO2 24 19 - 32 mEq/L   Glucose, Bld 92 70 - 99 mg/dL   BUN 18 6 - 23 mg/dL   Creatinine, Ser 1.22 0.50 - 1.35 mg/dL   Calcium 9.4 8.4 - 10.5 mg/dL   Total Protein 7.0 6.0 - 8.3 g/dL   Albumin 3.9 3.5 - 5.2 g/dL   AST 27 0 - 37 U/L   ALT 30 0 - 53 U/L   Alkaline Phosphatase 62 39 - 117 U/L   Total Bilirubin 0.5 0.3 - 1.2 mg/dL   GFR calc non Af Amer 61 (L) >90 mL/min   GFR calc Af Amer 71 (L) >90 mL/min    Comment: (NOTE) The eGFR has been calculated using the CKD EPI equation. This calculation has not been validated in all clinical situations. eGFR's persistently <90 mL/min signify possible Chronic Kidney Disease.    Anion gap 14 5 - 15  Urinalysis, Routine w reflex microscopic     Status: Abnormal   Collection Time: 10/01/14  9:34 PM  Result Value Ref Range   Color, Urine YELLOW YELLOW   APPearance CLOUDY (A) CLEAR   Specific Gravity, Urine 1.018 1.005 -  1.030   pH 5.5 5.0 - 8.0   Glucose, UA NEGATIVE NEGATIVE mg/dL   Hgb urine dipstick LARGE (A) NEGATIVE   Bilirubin Urine NEGATIVE NEGATIVE   Ketones, ur NEGATIVE NEGATIVE mg/dL   Protein, ur 100 (A) NEGATIVE mg/dL   Urobilinogen, UA 0.2 0.0 - 1.0 mg/dL   Nitrite NEGATIVE NEGATIVE   Leukocytes, UA TRACE (A) NEGATIVE  Urine microscopic-add on     Status: Abnormal   Collection Time: 10/01/14  9:34 PM  Result Value Ref Range   Squamous Epithelial / LPF RARE RARE   WBC, UA 0-2 <3 WBC/hpf   RBC / HPF TOO NUMEROUS TO COUNT <3 RBC/hpf   Bacteria, UA RARE RARE   Casts GRANULAR CAST (A) NEGATIVE  Rheumatoid Factor     Status: None   Collection Time: 10/06/14 11:47 AM  Result Value Ref Range   Rhuematoid fact SerPl-aCnc <10 <=14 IU/mL     Comment:                            Interpretive Table                     Low Positive: 15 - 41 IU/mL                     High Positive:  >= 42 IU/mL    In addition to the RF result, and clinical symptoms including joint  involvement, the 2010 ACR Classification Criteria for  scoring/diagnosing Rheumatoid Arthritis include the results of the  following tests:  CRP (77824), ESR (15010), and CCP (APCA) (23536).  [email or web address]   Cyclic citrul peptide antibody, IgG     Status: None   Collection Time: 10/06/14 11:47 AM  Result Value Ref Range   Cyclic Citrullin Peptide Ab <2.0 0.0 - 5.0 U/mL    Comment:                              Interpretive Table                       Low Positive:  5.1 - 14.9 U/mL                       High Positive:  >= 15.0 U/mL   In addition to the CCP (AP[facility]) result, and clinical symptoms including joint involvement, the 2010 ACR Classification Criteria for scoring/diagnosing Rheumatoid Arthritis include the results of the following tests: RF (41937), CRP (90240), and ESR (15010). www.rheumatology.org/practice/clinical/classification/ra/ra_2010.asp   Urinalysis, Routine w reflex microscopic     Status: Abnormal   Collection Time: 10/11/14  1:10 AM  Result Value Ref Range   Color, Urine RED (A) YELLOW    Comment: BIOCHEMICALS MAY BE AFFECTED BY COLOR   APPearance CLOUDY (A) CLEAR   Specific Gravity, Urine 1.020 1.005 - 1.030   pH 5.5 5.0 - 8.0   Glucose, UA NEGATIVE NEGATIVE mg/dL   Hgb urine dipstick LARGE (A) NEGATIVE   Bilirubin Urine NEGATIVE NEGATIVE   Ketones, ur 15 (A) NEGATIVE mg/dL   Protein, ur 100 (A) NEGATIVE mg/dL   Urobilinogen, UA 0.2 0.0 - 1.0 mg/dL   Nitrite NEGATIVE NEGATIVE   Leukocytes, UA MODERATE (A) NEGATIVE  Urine microscopic-add on     Status: Abnormal   Collection Time: 10/11/14  1:10 AM  Result Value Ref Range  Squamous Epithelial / LPF FEW (A) RARE   WBC, UA 7-10 <3  WBC/hpf   RBC / HPF TOO NUMEROUS TO COUNT <3 RBC/hpf   Bacteria, UA FEW (A) RARE  Urinalysis, Routine w reflex microscopic     Status: Abnormal   Collection Time: 10/31/14  5:24 PM  Result Value Ref Range   Color, Urine YELLOW YELLOW   APPearance CLOUDY (A) CLEAR   Specific Gravity, Urine 1.021 1.005 - 1.030   pH 5.5 5.0 - 8.0   Glucose, UA NEGATIVE NEGATIVE mg/dL   Hgb urine dipstick MODERATE (A) NEGATIVE   Bilirubin Urine NEGATIVE NEGATIVE   Ketones, ur 15 (A) NEGATIVE mg/dL   Protein, ur 30 (A) NEGATIVE mg/dL   Urobilinogen, UA 0.2 0.0 - 1.0 mg/dL   Nitrite NEGATIVE NEGATIVE   Leukocytes, UA LARGE (A) NEGATIVE  Urine microscopic-add on     Status: Abnormal   Collection Time: 10/31/14  5:24 PM  Result Value Ref Range   Squamous Epithelial / LPF RARE RARE   WBC, UA TOO NUMEROUS TO COUNT <3 WBC/hpf   RBC / HPF 11-20 <3 RBC/hpf   Bacteria, UA MANY (A) RARE   Urine-Other MUCOUS PRESENT   Urine culture     Status: None   Collection Time: 10/31/14  5:24 PM  Result Value Ref Range   Specimen Description URINE, RANDOM    Special Requests NONE    Colony Count NO GROWTH Performed at [facility]     Culture NO GROWTH Performed at [facility]     Report Status 11/01/2014 FINAL   CBC     Status: None   Collection Time: 10/31/14  5:40 PM  Result Value Ref Range   WBC 6.9 4.0 - 10.5 K/uL   RBC 4.46 4.22 - 5.81 MIL/uL   Hemoglobin 14.1 13.0 - 17.0 g/dL   HCT 41.8 39.0 - 52.0 %   MCV 93.7 78.0 - 100.0 fL   MCH 31.6 26.0 - 34.0 pg   MCHC 33.7 30.0 - 36.0 g/dL   RDW 12.9 11.5 - 15.5 %   Platelets 265 150 - 400 K/uL  Comprehensive metabolic panel     Status: Abnormal   Collection Time: 10/31/14  5:40 PM  Result Value Ref Range   Sodium 136 135 - 145 mmol/L    Comment: Please note change in reference range.   Potassium 4.0 3.5 - 5.1 mmol/L    Comment: Please note change in reference range.   Chloride 105 96 - 112 mEq/L   CO2 26 19 - 32 mmol/L   Glucose, Bld  105 (H) 70 - 99 mg/dL   BUN 18 6 - 23 mg/dL   Creatinine, Ser 1.54 (H) 0.50 - 1.35 mg/dL   Calcium 9.0 8.4 - 10.5 mg/dL   Total Protein 7.6 6.0 - 8.3 g/dL   Albumin 4.4 3.5 - 5.2 g/dL   AST 25 0 - 37 U/L   ALT 29 0 - 53 U/L   Alkaline Phosphatase 67 39 - 117 U/L   Total Bilirubin 0.8 0.3 - 1.2 mg/dL   GFR calc non Af Amer 46 (L) >90 mL/min   GFR calc Af Amer 53 (L) >90 mL/min    Comment: (NOTE) The eGFR has been calculated using the CKD EPI equation. This calculation has not been validated in all clinical situations. eGFR's persistently <90 mL/min signify possible Chronic Kidney Disease.    Anion gap 5 5 - 15  Lipase, blood  Status: None   Collection Time: 10/31/14  5:40 PM  Result Value Ref Range   Lipase 37 11 - 59 U/L  POCT urinalysis dipstick     Status: Abnormal   Collection Time: 11/03/14 11:36 AM  Result Value Ref Range   Color, UA gold    Clarity, UA cloudy    Glucose, UA neg    Bilirubin, UA neg    Ketones, UA neg    Spec Grav, UA >=1.030    Blood, UA Moderate (2+)    pH, UA 5.0    Protein, UA positive    Urobilinogen, UA 1.0    Nitrite, UA neg    Leukocytes, UA small (1+)   CBC     Status: None   Collection Time: 11/03/14 11:58 AM  Result Value Ref Range   WBC 6.1 4.0 - 10.5 K/uL   RBC 4.36 4.22 - 5.81 Mil/uL   Platelets 254.0 150.0 - 400.0 K/uL   Hemoglobin 13.7 13.0 - 17.0 g/dL   HCT 40.5 39.0 - 52.0 %   MCV 93.0 78.0 - 100.0 fl   MCHC 33.9 30.0 - 36.0 g/dL   RDW 13.8 11.5 - 15.5 %  Comp Met (CMET)     Status: Abnormal   Collection Time: 11/03/14 11:58 AM  Result Value Ref Range   Sodium 138 135 - 145 mEq/L   Potassium 4.3 3.5 - 5.1 mEq/L   Chloride 107 96 - 112 mEq/L   CO2 29 19 - 32 mEq/L   Glucose, Bld 103 (H) 70 - 99 mg/dL   BUN 19 6 - 23 mg/dL   Creatinine, Ser 1.12 0.40 - 1.50 mg/dL   Total Bilirubin 0.3 0.2 - 1.2 mg/dL   Alkaline Phosphatase 65 39 - 117 U/L   AST 20 0 - 37 U/L   ALT 28 0 - 53 U/L   Total Protein 6.8 6.0 - 8.3 g/dL    Albumin 4.1 3.5 - 5.2 g/dL   Calcium 9.3 8.4 - 10.5 mg/dL   GFR 69.94 >60.00 mL/min  CULTURE, URINE COMPREHENSIVE     Status: None   Collection Time: 11/03/14 11:58 AM  Result Value Ref Range   Colony Count NO GROWTH    Organism ID, Bacteria NO GROWTH   Urine Microscopic     Status: Abnormal   Collection Time: 11/03/14 11:58 AM  Result Value Ref Range   WBC, UA 21-50/hpf (A) 0-2/hpf   RBC / HPF 3-6/hpf (A) 0-2/hpf   Squamous Epithelial / LPF Rare(0-4/hpf) Rare(0-4/hpf)    Assessment/Plan: Flank pain Records from ER visit and urologist appointment reviewed. Urine negative for infection. There were some RBCs and WBCs under urine microscopy, which are normal for a patient who is status post TURP, who is also engaged in heavy lifting and sexual intercourse.  Some mild elevation of creatinine noted on lab results. I do not think patient's symptoms are related to infection. There is tenderness with range of motion. Suspect musculoskeletal etiology, in accordance with urologist assessment and plan. Encouraged patient to take his pain medication as directed. Use Flexeril to relax muscles. Avoid heavy lifting or over exertion. We'll repeat a CBC and BMP today. Urine dip unremarkable. We'll send for culture to verify absence of UTI.

## 2014-11-07 NOTE — Assessment & Plan Note (Signed)
Records from ER visit and urologist appointment reviewed. Urine negative for infection. There were some RBCs and WBCs under urine microscopy, which are normal for a patient who is status post TURP, who is also engaged in heavy lifting and sexual intercourse.  Some mild elevation of creatinine noted on lab results. I do not think patient's symptoms are related to infection. There is tenderness with range of motion. Suspect musculoskeletal etiology, in accordance with urologist assessment and plan. Encouraged patient to take his pain medication as directed. Use Flexeril to relax muscles. Avoid heavy lifting or over exertion. We'll repeat a CBC and BMP today. Urine dip unremarkable. We'll send for culture to verify absence of UTI.

## 2014-11-11 ENCOUNTER — Telehealth (HOSPITAL_COMMUNITY): Payer: Self-pay | Admitting: *Deleted

## 2014-11-12 ENCOUNTER — Telehealth: Payer: Self-pay | Admitting: Physician Assistant

## 2014-11-12 NOTE — Telephone Encounter (Signed)
Per provider VO, patient is only to be taking Metoprolol, d/c Losartan due to prior dizziness; Patient informed, understood & agreed/SLS

## 2014-11-12 NOTE — Telephone Encounter (Signed)
Caller name: Takota Relation to pt: self Call back number: [phone] Pharmacy:  Reason for call:   Patient states that he thought Einar Pheasant took him off of losartan. He says that his pharmacy just called him to pickup a new losartan rx that was sent in by [facility] on 11/04/14. Patient wants to know what medication he needs to be taking.

## 2014-11-12 NOTE — Telephone Encounter (Signed)
He was told to hold the losartan while we rechecked his renal function.  All was good.  Ok to resume the losartan.

## 2014-11-14 ENCOUNTER — Emergency Department (HOSPITAL_BASED_OUTPATIENT_CLINIC_OR_DEPARTMENT_OTHER)
Admission: EM | Admit: 2014-11-14 | Discharge: 2014-11-14 | Disposition: A | Discharge status: Home / Self Care | Payer: Medicare Other | Attending: Emergency Medicine | Admitting: Emergency Medicine

## 2014-11-14 ENCOUNTER — Emergency Department (HOSPITAL_BASED_OUTPATIENT_CLINIC_OR_DEPARTMENT_OTHER): Payer: Medicare Other

## 2014-11-14 ENCOUNTER — Encounter (HOSPITAL_BASED_OUTPATIENT_CLINIC_OR_DEPARTMENT_OTHER): Payer: Self-pay | Admitting: Emergency Medicine

## 2014-11-14 DIAGNOSIS — E119 Type 2 diabetes mellitus without complications: Secondary | ICD-10-CM | POA: Insufficient documentation

## 2014-11-14 DIAGNOSIS — J441 Chronic obstructive pulmonary disease with (acute) exacerbation: Secondary | ICD-10-CM | POA: Diagnosis not present

## 2014-11-14 DIAGNOSIS — K219 Gastro-esophageal reflux disease without esophagitis: Secondary | ICD-10-CM | POA: Diagnosis not present

## 2014-11-14 DIAGNOSIS — F419 Anxiety disorder, unspecified: Secondary | ICD-10-CM | POA: Diagnosis not present

## 2014-11-14 DIAGNOSIS — Z87891 Personal history of nicotine dependence: Secondary | ICD-10-CM | POA: Insufficient documentation

## 2014-11-14 DIAGNOSIS — R0602 Shortness of breath: Secondary | ICD-10-CM | POA: Diagnosis not present

## 2014-11-14 DIAGNOSIS — Z8612 Personal history of poliomyelitis: Secondary | ICD-10-CM | POA: Insufficient documentation

## 2014-11-14 DIAGNOSIS — Z7951 Long term (current) use of inhaled steroids: Secondary | ICD-10-CM | POA: Insufficient documentation

## 2014-11-14 DIAGNOSIS — R079 Chest pain, unspecified: Secondary | ICD-10-CM | POA: Insufficient documentation

## 2014-11-14 DIAGNOSIS — M1990 Unspecified osteoarthritis, unspecified site: Secondary | ICD-10-CM | POA: Diagnosis not present

## 2014-11-14 DIAGNOSIS — Z880 Allergy status to penicillin: Secondary | ICD-10-CM | POA: Insufficient documentation

## 2014-11-14 DIAGNOSIS — Z8619 Personal history of other infectious and parasitic diseases: Secondary | ICD-10-CM | POA: Insufficient documentation

## 2014-11-14 DIAGNOSIS — Z8611 Personal history of tuberculosis: Secondary | ICD-10-CM | POA: Insufficient documentation

## 2014-11-14 DIAGNOSIS — J449 Chronic obstructive pulmonary disease, unspecified: Secondary | ICD-10-CM | POA: Diagnosis not present

## 2014-11-14 DIAGNOSIS — Z79899 Other long term (current) drug therapy: Secondary | ICD-10-CM | POA: Diagnosis not present

## 2014-11-14 DIAGNOSIS — Z8546 Personal history of malignant neoplasm of prostate: Secondary | ICD-10-CM | POA: Diagnosis not present

## 2014-11-14 LAB — CBC WITH DIFFERENTIAL/PLATELET
BASOS PCT: 1 % (ref 0–1)
Basophils Absolute: 0 10*3/uL (ref 0.0–0.1)
Eosinophils Absolute: 0.5 10*3/uL (ref 0.0–0.7)
Eosinophils Relative: 6 % — ABNORMAL HIGH (ref 0–5)
HCT: 44 % (ref 39.0–52.0)
HEMOGLOBIN: 14.7 g/dL (ref 13.0–17.0)
LYMPHS ABS: 2 10*3/uL (ref 0.7–4.0)
Lymphocytes Relative: 28 % (ref 12–46)
MCH: 31.5 pg (ref 26.0–34.0)
MCHC: 33.4 g/dL (ref 30.0–36.0)
MCV: 94.2 fL (ref 78.0–100.0)
Monocytes Absolute: 0.5 10*3/uL (ref 0.1–1.0)
Monocytes Relative: 7 % (ref 3–12)
NEUTROS ABS: 4.2 10*3/uL (ref 1.7–7.7)
Neutrophils Relative %: 58 % (ref 43–77)
PLATELETS: 260 10*3/uL (ref 150–400)
RBC: 4.67 MIL/uL (ref 4.22–5.81)
RDW: 13 % (ref 11.5–15.5)
WBC: 7.1 10*3/uL (ref 4.0–10.5)

## 2014-11-14 LAB — TROPONIN I
Troponin I: <0.03 ng/mL (ref <0.031)
Troponin I: <0.03 ng/mL (ref <0.031)

## 2014-11-14 LAB — BASIC METABOLIC PANEL
Anion gap: 4 — ABNORMAL LOW (ref 5–15)
BUN: 16 mg/dL (ref 6–23)
CHLORIDE: 106 mmol/L (ref 96–112)
CO2: 26 mmol/L (ref 19–32)
Calcium: 9.2 mg/dL (ref 8.4–10.5)
Creatinine, Ser: 1.16 mg/dL (ref 0.50–1.35)
GFR calc Af Amer: 75 mL/min — ABNORMAL LOW (ref >90)
GFR, EST NON AFRICAN AMERICAN: 64 mL/min — AB (ref >90)
GLUCOSE: 122 mg/dL — AB (ref 70–99)
Potassium: 4.2 mmol/L (ref 3.5–5.1)
Sodium: 136 mmol/L (ref 135–145)

## 2014-11-14 MED ORDER — LEVALBUTEROL TARTRATE 45 MCG/ACT IN AERO: 2.0000 | INHALATION_SPRAY | Freq: Four times a day (QID) | RESPIRATORY_TRACT | Status: DC | PRN

## 2014-11-14 MED ORDER — NITROGLYCERIN 0.4 MG SL SUBL
0.4000 mg | SUBLINGUAL_TABLET | SUBLINGUAL | Status: DC | PRN
  Administered 2014-11-14: 0.4 mg via SUBLINGUAL
  Filled 2014-11-14: qty 1

## 2014-11-14 MED ORDER — LEVALBUTEROL HCL 0.63 MG/3ML IN NEBU
0.6300 mg | INHALATION_SOLUTION | Freq: Once | RESPIRATORY_TRACT | Status: AC
  Administered 2014-11-14: 0.63 mg via RESPIRATORY_TRACT
  Filled 2014-11-14: qty 3

## 2014-11-14 MED ORDER — ALBUTEROL (5 MG/ML) CONTINUOUS INHALATION SOLN
10.0000 mg/h | INHALATION_SOLUTION | RESPIRATORY_TRACT | Status: DC
  Administered 2014-11-14: 10 mg/h via RESPIRATORY_TRACT
  Filled 2014-11-14: qty 20

## 2014-11-14 MED ORDER — IPRATROPIUM-ALBUTEROL 0.5-2.5 (3) MG/3ML IN SOLN
3.0000 mL | RESPIRATORY_TRACT | Status: DC
  Administered 2014-11-14: 3 mL via RESPIRATORY_TRACT
  Filled 2014-11-14: qty 3

## 2014-11-14 MED ORDER — ALBUTEROL SULFATE (2.5 MG/3ML) 0.083% IN NEBU
2.5000 mg | INHALATION_SOLUTION | Freq: Once | RESPIRATORY_TRACT | Status: AC
  Administered 2014-11-14: 2.5 mg via RESPIRATORY_TRACT
  Filled 2014-11-14: qty 3

## 2014-11-14 MED ORDER — METHYLPREDNISOLONE SODIUM SUCC 125 MG IJ SOLR
125.0000 mg | Freq: Once | INTRAMUSCULAR | Status: AC
  Administered 2014-11-14: 125 mg via INTRAVENOUS
  Filled 2014-11-14: qty 2

## 2014-11-14 MED ORDER — PREDNISONE 20 MG PO TABS: ORAL_TABLET | ORAL | Status: DC

## 2014-11-14 NOTE — ED Notes (Addendum)
Ambulated in hall with pulse ox.  HR max 94, SpO2 95% on room air quick steady gait.  Complaint of chest tightness after walking, MD aware EKG done.  Placed on 2lpm O2

## 2014-11-14 NOTE — ED Provider Notes (Signed)
CSN: [id number]     Arrival date & time 11/14/14  0554 History   First MD Initiated Contact with Patient 11/14/14 0615     Chief Complaint  Patient presents with  . Shortness of Breath     (Consider location/radiation/quality/duration/timing/severity/associated sxs/prior Treatment) HPI  This is a 64 year old male with a history of COPD. He states he has overexerted himself past 2 days and this has resulted in worsening shortness of breath. Symptoms are moderate to severe depending on activity. He describes it as difficulty taking a deep breath; he feels that there is a "catch" when attempting deep breaths. He has been using his inhalers without adequate relief. He denies fever or chills. He denies nausea, vomiting or diarrhea. He does have occasional pains in his left upper chest. These pains are sharp and last only about a second at a time. He has had these many times in the past. He has also had some burning pain in his left upper back and shoulder which he attributes to arthritis exacerbated by recent overactivity. This pain is improved after his wife rubbed liniment on it. He had polio as a child and has some right-sided weakness and elevated right hemidiaphragm as a result.  Past Medical History  Diagnosis Date  . Hypertension   . Prostate cancer   . GERD (gastroesophageal reflux disease)   . History of TB (tuberculosis)     1984--  hospitalized for 4 month treatment  . Post-polio syndrome     polio at age 73--PT WAS IN IRON LUNG; PT WAS IN W/C UNTIL AGE 53; STILL HAS WEAKNESS RIGHT SIDE  . History of chronic bronchitis   . History of rheumatic fever   . COPD, frequent exacerbations     pulmologist-  dr Joya Gaskins--  Gold Stage C  . Bladder outlet obstruction   . Anxiety disorder   . History of urinary retention   . Nocturnal oxygen desaturation     USES O2 NIGHTLY  . Prostate cancer   . Complication of anesthesia     DIFFICULT WAKING   . Diabetes mellitus without complication      BODERLINE - DIET CONTROL  . Anxiety   . Shortness of breath dyspnea     RIGHT HEMIDIAPHRAGM ELEVATION - POST POLIO SYNDROME  . Dysrhythmia     PVC'S  . Arthritis     BILATERAL SHOULDERS, ELBOWS AND HANDS AND LEFT HIP AND KNEES--HX CORTISONE SHOTS IN SHOULDERS, ELBOWS, HIP AND KNEES   Past Surgical History  Procedure Laterality Date  . Other surgical history       Muscle & bone Graft/Polio  . Cystoscopy w/ cystogram/  transrectal ultrasound prostate bx  03-22-2009  . Cardiovascular stress test  06-08-2014  dr Mare Ferrari    normal lexiscan study/  no ischemia/  not gated due to PAC's  . Ureterosopy stone extraction  2000  . Nasal septum surgery  2000  . Shoulder arthroscopy with open rotator cuff repair Bilateral 2013  &  1999    removal spurs and bursectomy  . Laparoscopic cholecystectomy  2013  . Prostate biopsy N/A 09/28/2014    Procedure: PROSTATE ULTRASOUND/BIOPSY;  Surgeon: [name], MD;  Location: [facility];  Service: Urology;  Laterality: N/A;  . Transurethral resection of prostate N/A 09/28/2014    Procedure: TRANSURETHRAL RESECTION OF THE PROSTATE (TURP);  Surgeon: [name], MD;  Location: [facility];  Service: Urology;  Laterality: N/A;   Family History  Problem Relation Age of Onset  .  Alzheimer's disease Father 4    Deceased  . Stomach cancer Father   . Heart attack Father   . Heart disease Father   . Skin cancer Mother     Facial-Living  . Alcohol abuse Sister     x2  . Mental illness Sister     x2  . Diabetes Maternal Aunt     x2  . Thyroid disease Maternal Aunt     x4  . Diabetes Maternal Uncle   . Tuberculosis Paternal Grandfather   . Tuberculosis Paternal Grandmother   . Alzheimer's disease Paternal Aunt   . Alzheimer's disease Paternal Uncle    History  Substance Use Topics  . Smoking status: Former Smoker -- 63 years    Quit date: 08/25/2013  . Smokeless tobacco: Never Used     Comment: hx  smoke a pack of pipe tobacco(1.5oz) every 10 days.   .  Alcohol Use: 12.6 oz/week    21 Cans of beer per week     Comment: average 3 beers per day    Review of Systems  All other systems reviewed and are negative.   Allergies  Ivp dye; Levaquin; Penicillins; Aspirin; Morphine and related; and Nsaids  Home Medications   Prior to Admission medications   Medication Sig Start Date End Date Taking? Authorizing Provider  albuterol (PROVENTIL HFA;VENTOLIN HFA) 108 (90 BASE) MCG/ACT inhaler Inhale 1-2 puffs into the lungs every 6 (six) hours as needed for wheezing or shortness of breath. PROAIR 06/04/14   Brunetta Jeans, PA-C  albuterol (PROVENTIL) (2.5 MG/3ML) 0.083% nebulizer solution Take 2.5 mg by nebulization every 6 (six) hours as needed for wheezing or shortness of breath. Pt uses 2-3X daily    Historical Provider, MD  ALPRAZolam (XANAX) 1 MG tablet Take 1 tablet (1 mg total) by mouth 3 (three) times daily as needed for anxiety. Patient taking differently: Take 0.5 mg by mouth 3 (three) times daily as needed for anxiety.  06/03/14   Brunetta Jeans, PA-C  amLODipine (NORVASC) 10 MG tablet TAKE 1 TABLET (10 MG TOTAL) BY MOUTH DAILY. 10/29/14   Brunetta Jeans, PA-C  baclofen (LIORESAL) 10 MG tablet TAKE 1 TABLET (10 MG TOTAL) BY MOUTH 2 (TWO) TIMES DAILY. 10/29/14   Brunetta Jeans, PA-C  famotidine (PEPCID) 20 MG tablet Take 20 mg by mouth daily. TAKES AT 4 PM DAILY    Historical Provider, MD  fluticasone (FLONASE) 50 MCG/ACT nasal spray Place 2 sprays into both nostrils daily. 07/08/14   Elsie Stain, MD  Fluticasone Furoate-Vilanterol 100-25 MCG/INH AEPB Inhale 1 puff into the lungs daily. BREO ELLIPTA    Historical Provider, MD  levalbuterol [facility] HFA) 45 MCG/ACT inhaler Inhale 2 puffs into the lungs every 6 (six) hours as needed for wheezing. 11/14/14   Evelina Bucy, MD  loratadine (CLARITIN) 10 MG tablet Take 10 mg by mouth daily as needed for allergies.    Historical Provider, MD  metoprolol succinate (TOPROL-XL) 25 MG 24 hr tablet  Take 1 tablet (25 mg total) by mouth daily. 09/01/14   Brunetta Jeans, PA-C  montelukast (SINGULAIR) 10 MG tablet Take 1 tablet (10 mg total) by mouth at bedtime. 10/27/14   Elsie Stain, MD  omeprazole (PRILOSEC) 20 MG capsule Take 1 capsule (20 mg total) by mouth daily. 07/08/14   Elsie Stain, MD  oxyCODONE-acetaminophen (PERCOCET/ROXICET) 5-325 MG per tablet Take 1-2 tablets by mouth every 6 (six) hours as needed for severe pain. 10/31/14  Threasa Beards, MD  predniSONE (DELTASONE) 20 MG tablet 3 tabs po day one, then 2 tabs daily x 4 days 11/14/14   Evelina Bucy, MD  promethazine (PHENERGAN) 12.5 MG tablet Take 1 tablet (12.5 mg total) by mouth every 8 (eight) hours as needed for nausea or vomiting. 07/13/14   Brunetta Jeans, PA-C  tamsulosin [facility]) 0.4 MG CAPS capsule  08/09/14   Historical Provider, MD  tiotropium (SPIRIVA) 18 MCG inhalation capsule Place 18 mcg into inhaler and inhale daily. IN AM    Historical Provider, MD  zolpidem (AMBIEN) 5 MG tablet Take 1 tablet (5 mg total) by mouth at bedtime as needed for sleep. 10/06/14   Brunetta Jeans, PA-C   BP 122/53 mmHg  Pulse 77  Temp(Src) 97.7 F (36.5 C)  Resp 22  Ht 6\' 1"  (1.854 m)  Wt 214 lb (97.07 kg)  BMI 28.24 kg/m2  SpO2 95%   Physical Exam  General: Well-developed, well-nourished male in no acute distress; appearance consistent with age of record HENT: normocephalic; atraumatic Eyes: pupils equal, round and reactive to light; extraocular muscles intact Neck: supple Heart: regular rate and rhythm; distant sounds Lungs: Decreased air movement bilaterally with coarse inspiratory and expiratory sounds Abdomen: soft; nondistended; nontender; no masses or hepatosplenomegaly; bowel sounds present Extremities: No deformity; full range of motion; pulses normal Neurologic: Awake, alert and oriented; mild right-sided weakness; no facial droop Skin: Warm and dry Psychiatric: Normal mood and affect    ED Course   Procedures (including critical care time)   EKG Interpretation   Date/Time:  Sunday November 14 2014 08:45:58 EST Ventricular Rate:  77 PR Interval:  160 QRS Duration: 90 QT Interval:  378 QTC Calculation: 427 R Axis:   47 Text Interpretation:  Sinus rhythm with Premature atrial complexes in a  pattern of bigeminy Cannot rule out Anterior infarct , age undetermined  Abnormal ECG With Bigeminy with PACs, morphology similar to prior.  Confirmed by Mingo Amber  MD, [place] ([id number]) on 11/14/2014 8:54:05 AM      MDM  Nursing notes and vitals signs, including pulse oximetry, reviewed.  Summary of this visit's results, reviewed by myself:  Labs:  Results for orders placed or performed during the hospital encounter of 11/14/14 (from the past 24 hour(s))  CBC with Differential/Platelet     Status: Abnormal   Collection Time: 11/14/14  6:25 AM  Result Value Ref Range   WBC 7.1 4.0 - 10.5 K/uL   RBC 4.67 4.22 - 5.81 MIL/uL   Hemoglobin 14.7 13.0 - 17.0 g/dL   HCT 44.0 39.0 - 52.0 %   MCV 94.2 78.0 - 100.0 fL   MCH 31.5 26.0 - 34.0 pg   MCHC 33.4 30.0 - 36.0 g/dL   RDW 13.0 11.5 - 15.5 %   Platelets 260 150 - 400 K/uL   Neutrophils Relative % 58 43 - 77 %   Neutro Abs 4.2 1.7 - 7.7 K/uL   Lymphocytes Relative 28 12 - 46 %   Lymphs Abs 2.0 0.7 - 4.0 K/uL   Monocytes Relative 7 3 - 12 %   Monocytes Absolute 0.5 0.1 - 1.0 K/uL   Eosinophils Relative 6 (H) 0 - 5 %   Eosinophils Absolute 0.5 0.0 - 0.7 K/uL   Basophils Relative 1 0 - 1 %   Basophils Absolute 0.0 0.0 - 0.1 K/uL  Basic metabolic panel     Status: Abnormal   Collection Time: 11/14/14  6:25 AM  Result Value  Ref Range   Sodium 136 135 - 145 mmol/L   Potassium 4.2 3.5 - 5.1 mmol/L   Chloride 106 96 - 112 mmol/L   CO2 26 19 - 32 mmol/L   Glucose, Bld 122 (H) 70 - 99 mg/dL   BUN 16 6 - 23 mg/dL   Creatinine, Ser 1.16 0.50 - 1.35 mg/dL   Calcium 9.2 8.4 - 10.5 mg/dL   GFR calc non Af Amer 64 (L) >90 mL/min   GFR calc Af Amer  75 (L) >90 mL/min   Anion gap 4 (L) 5 - 15  Troponin I     Status: None   Collection Time: 11/14/14  6:26 AM  Result Value Ref Range   Troponin I <0.03 <0.031 ng/mL  Troponin I     Status: None   Collection Time: 11/14/14  9:10 AM  Result Value Ref Range   Troponin I <0.03 <0.031 ng/mL  Troponin I     Status: None   Collection Time: 11/14/14 11:09 AM  Result Value Ref Range   Troponin I <0.03 <0.031 ng/mL    Imaging Studies: Dg Chest 2 View  11/14/2014   CLINICAL DATA:  Chest pain for 4 days, recurrent. Shortness of breath. History of hypertension, COPD, GERD, prostate cancer, polio, diabetes.  EXAM: CHEST  2 VIEW  COMPARISON:  08/28/2014  FINDINGS: The heart size and mediastinal contours are within normal limits. Both lungs are clear. The visualized skeletal structures are unremarkable.  IMPRESSION: No active cardiopulmonary disease.   Electronically Signed   By: Lucienne Capers M.D.   On: 11/14/2014 06:58    6:52 AM Little relief from albuterol and Atrovent neb but patient inadvertently spilled much of the medication. Will place him on a continuous albuterol treatment for an hour; Dr. Mingo Amber will reassess and make disposition.      Wynetta Fines, MD 11/14/14 [phone]

## 2014-11-14 NOTE — Discharge Instructions (Signed)

## 2014-11-14 NOTE — ED Notes (Signed)
Ambulated in hall on room air.  HR 85-95, RR 18-24, SpO2 95%.  Speaking in complete sentences while ambulating.

## 2014-11-14 NOTE — ED Notes (Signed)
Pt reports chest pain x 4 days recurrent

## 2014-11-14 NOTE — ED Provider Notes (Signed)
0700 - Care from Dr. Florina Ou. 66M here with SOB. Awaiting hour long breathing treatment. 0800 - Patient feeling a little better after his breathing treatment. 74 - Walked down the hall and became much more SOB with chest tightness. Repeat EKG at this time with bigeminy which is new. With his atrial ectopy on EKG, will give xopenex instead of albuterol. Plan on admission for his COPD. 1000 - Patient feeling 100% better after his Xopenex. He doesn't want to stay in the hospital, all of his chest pain has resolved. I explained to the patient that this could be cardiac related as he got nitroglycerin, however he doesn't want to stay. He is amenable to serial troponins. Serial troponins negative. Feeling great, ambulating without difficulty. Stable for discharge.   1. COPD exacerbation   2. Shortness of breath   3. Chest pain, unspecified chest pain type      Evelina Bucy, MD 11/14/14 1256

## 2014-11-15 ENCOUNTER — Ambulatory Visit (HOSPITAL_COMMUNITY): Payer: Self-pay

## 2014-11-15 ENCOUNTER — Telehealth: Payer: Self-pay | Admitting: Critical Care Medicine

## 2014-11-15 NOTE — Telephone Encounter (Signed)
Spoke with patient-he is aware that PW can see the records from [facility] and we are aware of the Rx's they gave him for a COPD Exacerbation. Pt is aware to take meds accordingly. Also, patient had further questions/concerns but we were disconnected. I attempted to call patient back on his cell number and got his voicemail 4 different times. I then called and left a message at his home number listed to call us back so we can help answer his questions/concerns.

## 2014-11-17 ENCOUNTER — Telehealth: Payer: Self-pay | Admitting: *Deleted

## 2014-11-17 ENCOUNTER — Ambulatory Visit: Payer: Self-pay | Admitting: Physician Assistant

## 2014-11-17 NOTE — Telephone Encounter (Signed)
See note below

## 2014-11-17 NOTE — Telephone Encounter (Signed)
He can use albuterol He has severe obstruction on his pfts Take the prednisone Get pt in with TP sooner for recheck before my 12/2014 OV

## 2014-11-17 NOTE — Telephone Encounter (Signed)
Called and spoke to pt. Pt stated he recently went to  [facility] for an increase in SOB. Pt was put on a CAT for 1 hour. Pt stated they wanted to admit pt for his COPD but he refused. Per pt's chart pt had bigeminy on EKG and was given a xopenex. Pt is now questioning if he should still take the pred taper that was given and also if he needs to change from his albuterol neb and inhaler to the Xopenex inhaler they gave him. Advised pt to take the pred as prescribed and because he has not had prior issues with the albuterol to take it as previously prescribed. Pt requesting PW's recs. Pt had questions about his lung function. Appt made with PW in on on 12/15/2014. Pt verbalized understanding.   Will forward to PW for recs.

## 2014-11-17 NOTE — Telephone Encounter (Signed)
Spoke with patient on missed appointment today; he called this Am to let us know that it was not health related; he has spoken to Dr. Bettina Gavia office and is awaiting call back on questions. He has a future appointment scheduled and has Pulmonary rehab on 02.08.16. He did mention abnormal EKG that provider looked at and stated no change from previous and noted patient had had hour Albutrerol breathing Tx prior, any further concerns to go through Cardiology. He seems well spoken and no SOB via phone conversation and no current issues to attend to. Patient will call back if any further questions and/or concerns become present/SLS

## 2014-11-17 NOTE — Telephone Encounter (Signed)
No charge. 

## 2014-11-17 NOTE — Telephone Encounter (Signed)
Pt cancelled appointment 11/17/14 at 11:30am for "ED follow up/ss - LM on VM/ MH".  Called day of appointment and cancelled at 8:04am.  Charge no show fee?

## 2014-11-17 NOTE — Telephone Encounter (Signed)
LMTC x 1  

## 2014-11-18 NOTE — Telephone Encounter (Signed)
Called and spoke to pt. Informed pt of the recs per PW. Appt made with TP on 11/25/14. Pt verbalized understanding and denied any further questions or concerns at this time.

## 2014-11-19 ENCOUNTER — Telehealth: Payer: Self-pay | Admitting: Physician Assistant

## 2014-11-19 NOTE — Telephone Encounter (Signed)
Caller name:Pelzer, Rohit Relation to EZ:MOQH Call back [phone] Pharmacy:  Reason for call: pt would like to speak with you states he has questions in regards to the rx  predniSONE (DELTASONE) 20 MG tablet  And levalbuterol (XOPENEX HFA) 45 MCG/ACT inhaler , states because of his irregular heart beat he is not sure if he should take the meds because it makes the heart beat fast. Wants to review with Einar Pheasant to make sure before taking the meds.

## 2014-11-19 NOTE — Telephone Encounter (Signed)
Prednisone should be ok as he has tolerated previously.  The xopenex has less of a risk of tachycardia than albuterol.  He can take but if he notices racing heart, stop the medication and stick with other COPD medications.

## 2014-11-22 ENCOUNTER — Encounter (HOSPITAL_COMMUNITY)
Admission: RE | Admit: 2014-11-22 | Discharge: 2014-11-22 | Disposition: A | Discharge status: Home / Self Care | Payer: Medicare Other | Source: Ambulatory Visit | Attending: Critical Care Medicine | Admitting: Critical Care Medicine

## 2014-11-22 ENCOUNTER — Ambulatory Visit (INDEPENDENT_AMBULATORY_CARE_PROVIDER_SITE_OTHER): Payer: Medicare Other | Admitting: Physician Assistant

## 2014-11-22 ENCOUNTER — Other Ambulatory Visit: Payer: Self-pay | Admitting: Physician Assistant

## 2014-11-22 ENCOUNTER — Encounter: Payer: Self-pay | Admitting: Physician Assistant

## 2014-11-22 VITALS — BP 120/70 | HR 74

## 2014-11-22 VITALS — BP 125/74 | HR 58 | Temp 97.5°F | Resp 16 | Ht 73.0 in | Wt 217.4 lb

## 2014-11-22 DIAGNOSIS — J449 Chronic obstructive pulmonary disease, unspecified: Secondary | ICD-10-CM | POA: Diagnosis not present

## 2014-11-22 DIAGNOSIS — Z5189 Encounter for other specified aftercare: Secondary | ICD-10-CM | POA: Insufficient documentation

## 2014-11-22 DIAGNOSIS — J438 Other emphysema: Secondary | ICD-10-CM

## 2014-11-22 MED ORDER — LEVALBUTEROL TARTRATE 45 MCG/ACT IN AERO: 2.0000 | INHALATION_SPRAY | Freq: Four times a day (QID) | RESPIRATORY_TRACT | Status: DC | PRN

## 2014-11-22 MED ORDER — FLUTICASONE PROPIONATE 50 MCG/ACT NA SUSP: 2.0000 | Freq: Every day | NASAL | Status: DC

## 2014-11-22 MED ORDER — LINACLOTIDE 145 MCG PO CAPS: 145.0000 ug | ORAL_CAPSULE | Freq: Every day | ORAL | Status: DC

## 2014-11-22 NOTE — Progress Notes (Signed)
[name] 64 y.o. male Pulmonary Rehab Orientation Note Patient arrived today in Cardiac and Pulmonary Rehab for orientation to Pulmonary Rehab. He was transported from [employer] via wheel chair. He does not carry portable oxygen. Per pt, he uses oxygen at night when going to sleep. Color good, skin warm and dry. Patient is oriented to time and place. Patient's medical history and medications reviewed. Heart rate is normal, breath sounds clear to auscultation, no wheezes, rales, or rhonchi. Grip strength equal, strong. Distal pulses 2+ bilateral pedal pulses present. Patient reports he does take medications as prescribed. Patient states he follows a Regular diet. The patient reports no specific efforts to gain or lose weight. Although patient states he would like to lose approximately 30 pounds. Patient's weight will be monitored closely.  The nutritionist will meet with patient while in the program to address healthy tips to lose weight.   Demonstration and practice of PLB using pulse oximeter. Patient has never heard of purse lip breathing and feels this will help him when he feels anxious about shortness of breath.  Patient able to return demonstration satisfactorily. Safety and hand hygiene in the exercise area reviewed with patient. I discussed with patient the importance of eating a healthy meal before exercising in the program.  Patient is high risk for falls due to a fall in the last year which patient suffered a concussion.  Patient voices understanding of the information reviewed. Department expectations discussed with patient and achievable goals were set.  Patient's goals were to decrease emphysema exacerbations and to be able to finish remodeling his current house. The patient shows enthusiasm about attending the program and we look forward to working with this nice gentleman.  Patient scored 0 on the PHQ-2 survey.  He does drink alcohol on a regular basis and will drink as much as 12 beers  at a time.  The patient is scheduled for a 6 min walk test on Thursday, November 25, 2014 @ 3:30 pm and to begin exercise on Tuesday, November 30, 2014 in the 1:30 pm class. [id number]

## 2014-11-22 NOTE — Telephone Encounter (Signed)
Rx request to pharmacy/SLS  

## 2014-11-22 NOTE — Progress Notes (Signed)
Pre visit review using our clinic review tool, if applicable. No additional management support is needed unless otherwise documented below in the visit note/SLS  

## 2014-11-22 NOTE — Progress Notes (Signed)
Patient presents to clinic today to discuss Albuterol versus Xopenex as rescue inhaler.  Patient seen in ER for COPD exacerbation recently and was told to discontinue his albuterol and begin Xopenex.  Endorses good relief of respiratory symptoms with the Xopenex.  Does not make him jittery like the Albuterol.  He is wanting to make sure it is ok to take this medication.  Past Medical History  Diagnosis Date  . Hypertension   . Prostate cancer   . GERD (gastroesophageal reflux disease)   . History of TB (tuberculosis)     1984--  hospitalized for 4 month treatment  . Post-polio syndrome     polio at age 54--PT WAS IN IRON LUNG; PT WAS IN W/C UNTIL AGE 51; STILL HAS WEAKNESS RIGHT SIDE  . History of chronic bronchitis   . History of rheumatic fever   . COPD, frequent exacerbations     pulmologist-  dr Joya Gaskins--  Gold Stage C  . Bladder outlet obstruction   . Anxiety disorder   . History of urinary retention   . Nocturnal oxygen desaturation     USES O2 NIGHTLY  . Prostate cancer   . Complication of anesthesia     DIFFICULT WAKING   . Diabetes mellitus without complication     BODERLINE - DIET CONTROL  . Anxiety   . Shortness of breath dyspnea     RIGHT HEMIDIAPHRAGM ELEVATION - POST POLIO SYNDROME  . Dysrhythmia     PVC'S  . Arthritis     BILATERAL SHOULDERS, ELBOWS AND HANDS AND LEFT HIP AND KNEES--HX CORTISONE SHOTS IN SHOULDERS, ELBOWS, HIP AND KNEES    Current Outpatient Prescriptions on File Prior to Visit  Medication Sig Dispense Refill  . ALPRAZolam (XANAX) 1 MG tablet Take 1 tablet (1 mg total) by mouth 3 (three) times daily as needed for anxiety. (Patient taking differently: Take 0.5 mg by mouth 3 (three) times daily as needed for anxiety. ) 90 tablet 0  . amLODipine (NORVASC) 10 MG tablet TAKE 1 TABLET (10 MG TOTAL) BY MOUTH DAILY. 90 tablet 0  . baclofen (LIORESAL) 10 MG tablet TAKE 1 TABLET (10 MG TOTAL) BY MOUTH 2 (TWO) TIMES DAILY. 60 tablet 2  . loratadine  (CLARITIN) 10 MG tablet Take 10 mg by mouth daily as needed for allergies.    . metoprolol succinate (TOPROL-XL) 25 MG 24 hr tablet Take 1 tablet (25 mg total) by mouth daily. 30 tablet 3  . montelukast (SINGULAIR) 10 MG tablet Take 1 tablet (10 mg total) by mouth at bedtime. 30 tablet 5  . omeprazole (PRILOSEC) 20 MG capsule Take 1 capsule (20 mg total) by mouth daily. 30 capsule 4  . oxyCODONE-acetaminophen (PERCOCET/ROXICET) 5-325 MG per tablet Take 1-2 tablets by mouth every 6 (six) hours as needed for severe pain. 15 tablet 0  . predniSONE (DELTASONE) 20 MG tablet 3 tabs po day one, then 2 tabs daily x 4 days 11 tablet 0  . promethazine (PHENERGAN) 12.5 MG tablet Take 1 tablet (12.5 mg total) by mouth every 8 (eight) hours as needed for nausea or vomiting. 30 tablet 0  . tiotropium (SPIRIVA) 18 MCG inhalation capsule Place 18 mcg into inhaler and inhale daily. IN AM    . zolpidem (AMBIEN) 5 MG tablet Take 1 tablet (5 mg total) by mouth at bedtime as needed for sleep. 15 tablet 1   No current facility-administered medications on file prior to visit.    Allergies  Allergen Reactions  .  Ivp Dye [Iodinated Diagnostic Agents] Anaphylaxis  . Levaquin [Levofloxacin In D5w] Shortness Of Breath    In addition: sweating, chest pain, and diarrhea.   . Penicillins Anaphylaxis    Heart stops  . Aspirin Other (See Comments)    Reaction unknown  . Morphine And Related Nausea And Vomiting  . Nsaids Other (See Comments)    Difficulty breathing    Family History  Problem Relation Age of Onset  . Alzheimer's disease Father 4    Deceased  . Stomach cancer Father   . Heart attack Father   . Heart disease Father   . Skin cancer Mother     Facial-Living  . Alcohol abuse Sister     x2  . Mental illness Sister     x2  . Diabetes Maternal Aunt     x2  . Thyroid disease Maternal Aunt     x4  . Diabetes Maternal Uncle   . Tuberculosis Paternal Grandfather   . Tuberculosis Paternal Grandmother    . Alzheimer's disease Paternal Aunt   . Alzheimer's disease Paternal Uncle     History   Social History  . Marital Status: Married    Spouse Name: N/A    Number of Children: N/A  . Years of Education: N/A   Social History Main Topics  . Smoking status: Former Smoker -- 81 years    Quit date: 08/25/2013  . Smokeless tobacco: Never Used     Comment: hx  smoke a pack of pipe tobacco(1.5oz) every 10 days.   . Alcohol Use: 12.6 oz/week    21 Cans of beer per week     Comment: average 3 beers per day  . Drug Use: No  . Sexual Activity: None   Other Topics Concern  . None   Social History Narrative    Review of Systems - See HPI.  All other ROS are negative.  BP 125/74 mmHg  Pulse 58  Temp(Src) 97.5 F (36.4 C) (Oral)  Resp 16  Ht _0  (1.854 m)  Wt 217 lb 6 oz (98.601 kg)  BMI 28.69 kg/m2  SpO2 99%  Physical Exam  Constitutional: He is oriented to person, place, and time and well-developed, well-nourished, and in no distress.  HENT:  Head: Normocephalic and atraumatic.  Eyes: Conjunctivae are normal.  Neurological: He is alert and oriented to person, place, and time.  Skin: Skin is warm and dry.  Psychiatric: Affect normal.  Vitals reviewed.   Recent Results (from the past 2160 hour(s))  Urinalysis, Routine w reflex microscopic     Status: None   Collection Time: 08/28/14 12:53 PM  Result Value Ref Range   Color, Urine YELLOW YELLOW   APPearance CLEAR CLEAR   Specific Gravity, Urine 1.014 1.005 - 1.030   pH 5.5 5.0 - 8.0   Glucose, UA NEGATIVE NEGATIVE mg/dL   Hgb urine dipstick NEGATIVE NEGATIVE   Bilirubin Urine NEGATIVE NEGATIVE   Ketones, ur NEGATIVE NEGATIVE mg/dL   Protein, ur NEGATIVE NEGATIVE mg/dL   Urobilinogen, UA 0.2 0.0 - 1.0 mg/dL   Nitrite NEGATIVE NEGATIVE   Leukocytes, UA NEGATIVE NEGATIVE    Comment: MICROSCOPIC NOT DONE ON URINES WITH NEGATIVE PROTEIN, BLOOD, LEUKOCYTES, NITRITE, OR GLUCOSE <1000 mg/dL.  CBC     Status: Abnormal    Collection Time: 09/01/14  3:08 PM  Result Value Ref Range   WBC 6.5 4.0 - 10.5 K/uL   RBC 3.95 (L) 4.22 - 5.81 Mil/uL   Platelets 230.0 150.0 -  400.0 K/uL   Hemoglobin 12.4 (L) 13.0 - 17.0 g/dL   HCT 38.1 (L) 39.0 - 52.0 %   MCV 96.5 78.0 - 100.0 fl   MCHC 32.7 30.0 - 36.0 g/dL   RDW 15.0 11.5 - 15.5 %  TSH     Status: None   Collection Time: 09/01/14  3:08 PM  Result Value Ref Range   TSH 1.01 0.35 - 4.50 uIU/mL  B12     Status: None   Collection Time: 09/01/14  3:08 PM  Result Value Ref Range   Vitamin B-12 348 211 - 911 pg/mL  B. Burgdorfi Antibodies     Status: None   Collection Time: 09/01/14  3:08 PM  Result Value Ref Range   B burgdorferi Ab IgG+IgM 0.10 ISR    Comment: Antibody to Borrelia burgdorferi not detected.      ISR = Immune Status Ratio              <0.90         ISR       Negative              0.90 - 1.09   ISR       Equivocal              >=1.10        ISR       Positive   T4, free     Status: None   Collection Time: 09/01/14  3:08 PM  Result Value Ref Range   Free T4 0.82 0.60 - 1.60 ng/dL  RPR     Status: None   Collection Time: 09/01/14  3:08 PM  Result Value Ref Range   RPR Ser Ql NON REAC NON REAC  Testosterone, free, total     Status: Abnormal   Collection Time: 09/01/14  3:08 PM  Result Value Ref Range   Testosterone 209 (L) 300 - 890 ng/dL    Comment:           Tanner Stage       Male              Male               I              < 30 ng/dL        < 10 ng/dL               II             < 150 ng/dL       < 30 ng/dL               III            100-320 ng/dL     < 35 ng/dL               IV             200-970 ng/dL     15-40 ng/dL               V/Adult        300-890 ng/dL     10-70 ng/dL      Sex Hormone Binding 62 13 - 71 nmol/L   Testosterone, Free 26.0 (L) 47.0 - 244.0 pg/mL    Comment:   The concentration of free testosterone is derived from a mathematical expression based on constants for the binding of testosterone to  sex hormone-binding globulin and albumin.    Testosterone-% Free 1.2 (L) 1.6 - 2.9 %  CBC     Status: None   Collection Time: 09/23/14 12:20 PM  Result Value Ref Range   WBC 6.4 4.0 - 10.5 K/uL   RBC 4.35 4.22 - 5.81 MIL/uL   Hemoglobin 13.7 13.0 - 17.0 g/dL   HCT 40.9 39.0 - 52.0 %   MCV 94.0 78.0 - 100.0 fL   MCH 31.5 26.0 - 34.0 pg   MCHC 33.5 30.0 - 36.0 g/dL   RDW 13.8 11.5 - 15.5 %   Platelets 268 150 - 400 K/uL  Basic metabolic panel     Status: Abnormal   Collection Time: 09/23/14 12:20 PM  Result Value Ref Range   Sodium 140 137 - 147 mEq/L   Potassium 4.5 3.7 - 5.3 mEq/L   Chloride 103 96 - 112 mEq/L   CO2 25 19 - 32 mEq/L   Glucose, Bld 97 70 - 99 mg/dL   BUN 12 6 - 23 mg/dL   Creatinine, Ser 1.28 0.50 - 1.35 mg/dL   Calcium 9.3 8.4 - 10.5 mg/dL   GFR calc non Af Amer 58 (L) >90 mL/min   GFR calc Af Amer 67 (L) >90 mL/min    Comment: (NOTE) The eGFR has been calculated using the CKD EPI equation. This calculation has not been validated in all clinical situations. eGFR's persistently <90 mL/min signify possible Chronic Kidney Disease.    Anion gap 12 5 - 15  Glucose, capillary     Status: Abnormal   Collection Time: 09/28/14 10:54 AM  Result Value Ref Range   Glucose-Capillary 166 (H) 70 - 99 mg/dL   Comment 1 Notify RN   CBC with Differential     Status: None   Collection Time: 10/01/14  8:25 PM  Result Value Ref Range   WBC 6.9 4.0 - 10.5 K/uL   RBC 4.22 4.22 - 5.81 MIL/uL   Hemoglobin 13.5 13.0 - 17.0 g/dL   HCT 40.6 39.0 - 52.0 %   MCV 96.2 78.0 - 100.0 fL   MCH 32.0 26.0 - 34.0 pg   MCHC 33.3 30.0 - 36.0 g/dL   RDW 14.0 11.5 - 15.5 %   Platelets 242 150 - 400 K/uL   Neutrophils Relative % 60 43 - 77 %   Neutro Abs 4.1 1.7 - 7.7 K/uL   Lymphocytes Relative 25 12 - 46 %   Lymphs Abs 1.7 0.7 - 4.0 K/uL   Monocytes Relative 12 3 - 12 %   Monocytes Absolute 0.9 0.1 - 1.0 K/uL   Eosinophils Relative 3 0 - 5 %   Eosinophils Absolute 0.2 0.0 - 0.7  K/uL   Basophils Relative 0 0 - 1 %   Basophils Absolute 0.0 0.0 - 0.1 K/uL  Comprehensive metabolic panel     Status: Abnormal   Collection Time: 10/01/14  8:25 PM  Result Value Ref Range   Sodium 138 137 - 147 mEq/L   Potassium 4.1 3.7 - 5.3 mEq/L   Chloride 100 96 - 112 mEq/L   CO2 24 19 - 32 mEq/L   Glucose, Bld 92 70 - 99 mg/dL   BUN 18 6 - 23 mg/dL   Creatinine, Ser 1.22 0.50 - 1.35 mg/dL   Calcium 9.4 8.4 - 10.5 mg/dL   Total Protein 7.0 6.0 - 8.3 g/dL   Albumin 3.9 3.5 - 5.2 g/dL   AST 27 0 - 37 U/L  ALT 30 0 - 53 U/L   Alkaline Phosphatase 62 39 - 117 U/L   Total Bilirubin 0.5 0.3 - 1.2 mg/dL   GFR calc non Af Amer 61 (L) >90 mL/min   GFR calc Af Amer 71 (L) >90 mL/min    Comment: (NOTE) The eGFR has been calculated using the CKD EPI equation. This calculation has not been validated in all clinical situations. eGFR's persistently <90 mL/min signify possible Chronic Kidney Disease.    Anion gap 14 5 - 15  Urinalysis, Routine w reflex microscopic     Status: Abnormal   Collection Time: 10/01/14  9:34 PM  Result Value Ref Range   Color, Urine YELLOW YELLOW   APPearance CLOUDY (A) CLEAR   Specific Gravity, Urine 1.018 1.005 - 1.030   pH 5.5 5.0 - 8.0   Glucose, UA NEGATIVE NEGATIVE mg/dL   Hgb urine dipstick LARGE (A) NEGATIVE   Bilirubin Urine NEGATIVE NEGATIVE   Ketones, ur NEGATIVE NEGATIVE mg/dL   Protein, ur 100 (A) NEGATIVE mg/dL   Urobilinogen, UA 0.2 0.0 - 1.0 mg/dL   Nitrite NEGATIVE NEGATIVE   Leukocytes, UA TRACE (A) NEGATIVE  Urine microscopic-add on     Status: Abnormal   Collection Time: 10/01/14  9:34 PM  Result Value Ref Range   Squamous Epithelial / LPF RARE RARE   WBC, UA 0-2 <3 WBC/hpf   RBC / HPF TOO NUMEROUS TO COUNT <3 RBC/hpf   Bacteria, UA RARE RARE   Casts GRANULAR CAST (A) NEGATIVE  Rheumatoid Factor     Status: None   Collection Time: 10/06/14 11:47 AM  Result Value Ref Range   Rhuematoid fact SerPl-aCnc <10 <=14 IU/mL     Comment:                            Interpretive Table                     Low Positive: 15 - 41 IU/mL                     High Positive:  >= 42 IU/mL    In addition to the RF result, and clinical symptoms including joint  involvement, the 2010 ACR Classification Criteria for  scoring/diagnosing Rheumatoid Arthritis include the results of the  following tests:  CRP (30076), ESR (15010), and CCP (APCA) (22633).  [email or web address]   Cyclic citrul peptide antibody, IgG     Status: None   Collection Time: 10/06/14 11:47 AM  Result Value Ref Range   Cyclic Citrullin Peptide Ab <2.0 0.0 - 5.0 U/mL    Comment:                              Interpretive Table                       Low Positive:  5.1 - 14.9 U/mL                       High Positive:  >= 15.0 U/mL   In addition to the CCP (AP[facility]) result, and clinical symptoms including joint involvement, the 2010 ACR Classification Criteria for scoring/diagnosing Rheumatoid Arthritis include the results of the following tests: RF (80321), CRP (22482), and ESR (15010). www.rheumatology.org/practice/clinical/classification/ra/ra_2010.asp   Urinalysis, Routine w reflex microscopic     Status: Abnormal  Collection Time: 10/11/14  1:10 AM  Result Value Ref Range   Color, Urine RED (A) YELLOW    Comment: BIOCHEMICALS MAY BE AFFECTED BY COLOR   APPearance CLOUDY (A) CLEAR   Specific Gravity, Urine 1.020 1.005 - 1.030   pH 5.5 5.0 - 8.0   Glucose, UA NEGATIVE NEGATIVE mg/dL   Hgb urine dipstick LARGE (A) NEGATIVE   Bilirubin Urine NEGATIVE NEGATIVE   Ketones, ur 15 (A) NEGATIVE mg/dL   Protein, ur 100 (A) NEGATIVE mg/dL   Urobilinogen, UA 0.2 0.0 - 1.0 mg/dL   Nitrite NEGATIVE NEGATIVE   Leukocytes, UA MODERATE (A) NEGATIVE  Urine microscopic-add on     Status: Abnormal   Collection Time: 10/11/14  1:10 AM  Result Value Ref Range   Squamous Epithelial / LPF FEW (A) RARE   WBC, UA 7-10 <3  WBC/hpf   RBC / HPF TOO NUMEROUS TO COUNT <3 RBC/hpf   Bacteria, UA FEW (A) RARE  Urinalysis, Routine w reflex microscopic     Status: Abnormal   Collection Time: 10/31/14  5:24 PM  Result Value Ref Range   Color, Urine YELLOW YELLOW   APPearance CLOUDY (A) CLEAR   Specific Gravity, Urine 1.021 1.005 - 1.030   pH 5.5 5.0 - 8.0   Glucose, UA NEGATIVE NEGATIVE mg/dL   Hgb urine dipstick MODERATE (A) NEGATIVE   Bilirubin Urine NEGATIVE NEGATIVE   Ketones, ur 15 (A) NEGATIVE mg/dL   Protein, ur 30 (A) NEGATIVE mg/dL   Urobilinogen, UA 0.2 0.0 - 1.0 mg/dL   Nitrite NEGATIVE NEGATIVE   Leukocytes, UA LARGE (A) NEGATIVE  Urine microscopic-add on     Status: Abnormal   Collection Time: 10/31/14  5:24 PM  Result Value Ref Range   Squamous Epithelial / LPF RARE RARE   WBC, UA TOO NUMEROUS TO COUNT <3 WBC/hpf   RBC / HPF 11-20 <3 RBC/hpf   Bacteria, UA MANY (A) RARE   Urine-Other MUCOUS PRESENT   Urine culture     Status: None   Collection Time: 10/31/14  5:24 PM  Result Value Ref Range   Specimen Description URINE, RANDOM    Special Requests NONE    Colony Count NO GROWTH Performed at [facility]     Culture NO GROWTH Performed at [facility]     Report Status 11/01/2014 FINAL   CBC     Status: None   Collection Time: 10/31/14  5:40 PM  Result Value Ref Range   WBC 6.9 4.0 - 10.5 K/uL   RBC 4.46 4.22 - 5.81 MIL/uL   Hemoglobin 14.1 13.0 - 17.0 g/dL   HCT 41.8 39.0 - 52.0 %   MCV 93.7 78.0 - 100.0 fL   MCH 31.6 26.0 - 34.0 pg   MCHC 33.7 30.0 - 36.0 g/dL   RDW 12.9 11.5 - 15.5 %   Platelets 265 150 - 400 K/uL  Comprehensive metabolic panel     Status: Abnormal   Collection Time: 10/31/14  5:40 PM  Result Value Ref Range   Sodium 136 135 - 145 mmol/L    Comment: Please note change in reference range.   Potassium 4.0 3.5 - 5.1 mmol/L    Comment: Please note change in reference range.   Chloride 105 96 - 112 mEq/L   CO2 26 19 - 32 mmol/L   Glucose, Bld  105 (H) 70 - 99 mg/dL   BUN 18 6 - 23 mg/dL   Creatinine, Ser 1.54 (H) 0.50 -  1.35 mg/dL   Calcium 9.0 8.4 - 10.5 mg/dL   Total Protein 7.6 6.0 - 8.3 g/dL   Albumin 4.4 3.5 - 5.2 g/dL   AST 25 0 - 37 U/L   ALT 29 0 - 53 U/L   Alkaline Phosphatase 67 39 - 117 U/L   Total Bilirubin 0.8 0.3 - 1.2 mg/dL   GFR calc non Af Amer 46 (L) >90 mL/min   GFR calc Af Amer 53 (L) >90 mL/min    Comment: (NOTE) The eGFR has been calculated using the CKD EPI equation. This calculation has not been validated in all clinical situations. eGFR's persistently <90 mL/min signify possible Chronic Kidney Disease.    Anion gap 5 5 - 15  Lipase, blood     Status: None   Collection Time: 10/31/14  5:40 PM  Result Value Ref Range   Lipase 37 11 - 59 U/L  POCT urinalysis dipstick     Status: Abnormal   Collection Time: 11/03/14 11:36 AM  Result Value Ref Range   Color, UA gold    Clarity, UA cloudy    Glucose, UA neg    Bilirubin, UA neg    Ketones, UA neg    Spec Grav, UA >=1.030    Blood, UA Moderate (2+)    pH, UA 5.0    Protein, UA positive    Urobilinogen, UA 1.0    Nitrite, UA neg    Leukocytes, UA small (1+)   CBC     Status: None   Collection Time: 11/03/14 11:58 AM  Result Value Ref Range   WBC 6.1 4.0 - 10.5 K/uL   RBC 4.36 4.22 - 5.81 Mil/uL   Platelets 254.0 150.0 - 400.0 K/uL   Hemoglobin 13.7 13.0 - 17.0 g/dL   HCT 40.5 39.0 - 52.0 %   MCV 93.0 78.0 - 100.0 fl   MCHC 33.9 30.0 - 36.0 g/dL   RDW 13.8 11.5 - 15.5 %  Comp Met (CMET)     Status: Abnormal   Collection Time: 11/03/14 11:58 AM  Result Value Ref Range   Sodium 138 135 - 145 mEq/L   Potassium 4.3 3.5 - 5.1 mEq/L   Chloride 107 96 - 112 mEq/L   CO2 29 19 - 32 mEq/L   Glucose, Bld 103 (H) 70 - 99 mg/dL   BUN 19 6 - 23 mg/dL   Creatinine, Ser 1.12 0.40 - 1.50 mg/dL   Total Bilirubin 0.3 0.2 - 1.2 mg/dL   Alkaline Phosphatase 65 39 - 117 U/L   AST 20 0 - 37 U/L   ALT 28 0 - 53 U/L   Total Protein 6.8 6.0 - 8.3 g/dL    Albumin 4.1 3.5 - 5.2 g/dL   Calcium 9.3 8.4 - 10.5 mg/dL   GFR 69.94 >60.00 mL/min  CULTURE, URINE COMPREHENSIVE     Status: None   Collection Time: 11/03/14 11:58 AM  Result Value Ref Range   Colony Count NO GROWTH    Organism ID, Bacteria NO GROWTH   Urine Microscopic     Status: Abnormal   Collection Time: 11/03/14 11:58 AM  Result Value Ref Range   WBC, UA 21-50/hpf (A) 0-2/hpf   RBC / HPF 3-6/hpf (A) 0-2/hpf   Squamous Epithelial / LPF Rare(0-4/hpf) Rare(0-4/hpf)  CBC with Differential/Platelet     Status: Abnormal   Collection Time: 11/14/14  6:25 AM  Result Value Ref Range   WBC 7.1 4.0 - 10.5 K/uL   RBC 4.67 4.22 -  5.81 MIL/uL   Hemoglobin 14.7 13.0 - 17.0 g/dL   HCT 44.0 39.0 - 52.0 %   MCV 94.2 78.0 - 100.0 fL   MCH 31.5 26.0 - 34.0 pg   MCHC 33.4 30.0 - 36.0 g/dL   RDW 13.0 11.5 - 15.5 %   Platelets 260 150 - 400 K/uL   Neutrophils Relative % 58 43 - 77 %   Neutro Abs 4.2 1.7 - 7.7 K/uL   Lymphocytes Relative 28 12 - 46 %   Lymphs Abs 2.0 0.7 - 4.0 K/uL   Monocytes Relative 7 3 - 12 %   Monocytes Absolute 0.5 0.1 - 1.0 K/uL   Eosinophils Relative 6 (H) 0 - 5 %   Eosinophils Absolute 0.5 0.0 - 0.7 K/uL   Basophils Relative 1 0 - 1 %   Basophils Absolute 0.0 0.0 - 0.1 K/uL  Basic metabolic panel     Status: Abnormal   Collection Time: 11/14/14  6:25 AM  Result Value Ref Range   Sodium 136 135 - 145 mmol/L   Potassium 4.2 3.5 - 5.1 mmol/L   Chloride 106 96 - 112 mmol/L   CO2 26 19 - 32 mmol/L   Glucose, Bld 122 (H) 70 - 99 mg/dL   BUN 16 6 - 23 mg/dL   Creatinine, Ser 1.16 0.50 - 1.35 mg/dL   Calcium 9.2 8.4 - 10.5 mg/dL   GFR calc non Af Amer 64 (L) >90 mL/min   GFR calc Af Amer 75 (L) >90 mL/min    Comment: (NOTE) The eGFR has been calculated using the CKD EPI equation. This calculation has not been validated in all clinical situations. eGFR's persistently <90 mL/min signify possible Chronic Kidney Disease.    Anion gap 4 (L) 5 - 15  Troponin I      Status: None   Collection Time: 11/14/14  6:26 AM  Result Value Ref Range   Troponin I <0.03 <0.031 ng/mL    Comment:        NO INDICATION OF MYOCARDIAL INJURY.   Troponin I     Status: None   Collection Time: 11/14/14  9:10 AM  Result Value Ref Range   Troponin I <0.03 <0.031 ng/mL    Comment:        NO INDICATION OF MYOCARDIAL INJURY.   Troponin I     Status: None   Collection Time: 11/14/14 11:09 AM  Result Value Ref Range   Troponin I <0.03 <0.031 ng/mL    Comment:        NO INDICATION OF MYOCARDIAL INJURY.     Assessment/Plan: COPD (chronic obstructive pulmonary disease) Gold C Frequent exacerbations Discussed SABAs -- Albuterol and Levalbuterol (Xopenex).  Feel it is completely acceptable for him to use the Xopenex if it gives better relief of symptoms and he does not experience jitteriness as he does with Albuterol.  Continue other chronic COPD medications as directed.

## 2014-11-22 NOTE — Patient Instructions (Addendum)
Please take the Xopenex instead of the Albuterol as your rescue inhaler. Medication refills sent to the pharmacy. Please continue other medications as directed.  Start the Linzess once daily to help promote regular bowel movements.

## 2014-11-22 NOTE — Assessment & Plan Note (Signed)
Discussed SABAs -- Albuterol and Levalbuterol (Xopenex).  Feel it is completely acceptable for him to use the Xopenex if it gives better relief of symptoms and he does not experience jitteriness as he does with Albuterol.  Continue other chronic COPD medications as directed.

## 2014-11-22 NOTE — Telephone Encounter (Signed)
Patient seen in office today 02.08.16, all discussed/SLS

## 2014-11-25 ENCOUNTER — Encounter: Payer: Self-pay | Admitting: Adult Health

## 2014-11-25 ENCOUNTER — Ambulatory Visit (HOSPITAL_COMMUNITY): Payer: Self-pay

## 2014-11-25 ENCOUNTER — Ambulatory Visit (INDEPENDENT_AMBULATORY_CARE_PROVIDER_SITE_OTHER): Payer: Medicare Other | Admitting: Adult Health

## 2014-11-25 ENCOUNTER — Encounter (INDEPENDENT_AMBULATORY_CARE_PROVIDER_SITE_OTHER): Payer: Self-pay

## 2014-11-25 VITALS — BP 118/90 | HR 67 | Temp 98.1°F | Ht 71.0 in | Wt 211.6 lb

## 2014-11-25 DIAGNOSIS — J449 Chronic obstructive pulmonary disease, unspecified: Secondary | ICD-10-CM

## 2014-11-25 DIAGNOSIS — J441 Chronic obstructive pulmonary disease with (acute) exacerbation: Secondary | ICD-10-CM

## 2014-11-25 MED ORDER — LEVALBUTEROL HCL 0.63 MG/3ML IN NEBU
0.6300 mg | INHALATION_SOLUTION | Freq: Once | RESPIRATORY_TRACT | Status: AC
  Administered 2014-11-25: 0.63 mg via RESPIRATORY_TRACT

## 2014-11-25 MED ORDER — PREDNISONE 10 MG PO TABS: ORAL_TABLET | ORAL | Status: DC

## 2014-11-25 NOTE — Patient Instructions (Addendum)
Prednisone taper over next week.  Mucinex DM Twice daily  As needed  Cough/congestion  Continue on BREO and Spiriva .  Follow up Dr. Joya Gaskins  In 6-8 weeks and As needed

## 2014-11-25 NOTE — Progress Notes (Signed)
   Subjective:    Patient ID: [name], male    DOB: [birth year], 64 y.o.   MRN: [id number]  HPI 64 yo former smoker with COPD   11/25/2014 ER follow up  Patient returns for an emergency room follow-up  Seen 2 weeks ago in the emergency room for COPD exacerbation Chest x-ray without any acute process noted Labs were unrevealing with negative cardiac enzymes Patient was given a Xopenex nebulizer treatment with some improvement in symptoms Patient returns complaining of persistent symptoms of wheezing, tightness and shortness of breath Symptoms are worse with cold air and has associated dry cough He denies any fever or discolored mucus, chest pain, palpitations, syncope, orthopnea, PND, or increased leg swelling Had cardiac evaluation last year with Stress test 05/2014 neg  And 2 D echo nml EF  He is going to pulmonary rehabilitation He started left over prednisone 20 mg 2 days ago.. Noticed some relief.  Review of Systems  Constitutional:   No  weight loss, night sweats,  Fevers, chills,  +fatigue, or  lassitude.  HEENT:   No headaches,  Difficulty swallowing,  Tooth/dental problems, or  Sore throat,                No sneezing, itching, ear ache, +nasal congestion, post nasal drip,   CV:  No chest pain,  Orthopnea, PND, swelling in lower extremities, anasarca, dizziness, palpitations, syncope.   GI  No heartburn, indigestion, abdominal pain, nausea, vomiting, diarrhea, change in bowel habits, loss of appetite, bloody stools.   Resp: .  No chest wall deformity  Skin: no rash or lesions.  GU: no dysuria, change in color of urine, no urgency or frequency.  No flank pain, no hematuria   MS:  No joint pain or swelling.  No decreased range of motion.  No back pain.  Psych:  No change in mood or affect. No depression or anxiety.  No memory loss.          Objective:   Physical Exam GEN: A/Ox3; pleasant , NAD ,   HEENT:  [place]/AT,  EACs-clear, TMs-wnl, NOSE-clear,  THROAT-clear, no lesions, no postnasal drip or exudate noted.   NECK:  Supple w/ fair ROM; no JVD; normal carotid impulses w/o bruits; no thyromegaly or nodules palpated; no lymphadenopathy.  RESP  Decreased BS , no wheezing .no accessory muscle use, no dullness to percussion  CARD:  RRR, no m/r/g  , no peripheral edema, pulses intact, no cyanosis or clubbing.  GI:   Soft & nt; nml bowel sounds; no organomegaly or masses detected.  Musco: Warm bil, no deformities or joint swelling noted.   Neuro: alert, no focal deficits noted.    Skin: Warm, no lesions or rashes         Assessment & Plan:

## 2014-11-25 NOTE — Assessment & Plan Note (Signed)
COPD exacerbation   Plan  Prednisone taper over next week.  Mucinex DM Twice daily  As needed  Cough/congestion  Continue on BREO and Spiriva .  Follow up Dr. Joya Gaskins  In 6-8 weeks and As needed

## 2014-11-30 ENCOUNTER — Encounter (HOSPITAL_COMMUNITY): Payer: Medicare Other

## 2014-11-30 ENCOUNTER — Encounter (HOSPITAL_COMMUNITY)
Admission: RE | Admit: 2014-11-30 | Discharge: 2014-11-30 | Disposition: A | Discharge status: Home / Self Care | Payer: Medicare Other | Source: Ambulatory Visit | Attending: Critical Care Medicine | Admitting: Critical Care Medicine

## 2014-11-30 DIAGNOSIS — J449 Chronic obstructive pulmonary disease, unspecified: Secondary | ICD-10-CM | POA: Diagnosis not present

## 2014-11-30 DIAGNOSIS — Z5189 Encounter for other specified aftercare: Secondary | ICD-10-CM | POA: Diagnosis not present

## 2014-11-30 NOTE — Progress Notes (Signed)
Audie completed a Six-Minute Walk Test on 11/30/14 . Zacory walked 952 feet with 0 breaks.  The patient's lowest oxygen saturation was 94% , highest heart rate was 93 , and highest blood pressure was 146/70. The patient was on room air. Clancy stated that nothing hindered their walk test.

## 2014-12-02 ENCOUNTER — Encounter (HOSPITAL_COMMUNITY)
Admission: RE | Admit: 2014-12-02 | Discharge: 2014-12-02 | Disposition: A | Discharge status: Home / Self Care | Payer: Medicare Other | Source: Ambulatory Visit | Attending: Critical Care Medicine | Admitting: Critical Care Medicine

## 2014-12-02 DIAGNOSIS — J449 Chronic obstructive pulmonary disease, unspecified: Secondary | ICD-10-CM | POA: Diagnosis not present

## 2014-12-02 DIAGNOSIS — Z5189 Encounter for other specified aftercare: Secondary | ICD-10-CM | POA: Diagnosis not present

## 2014-12-02 NOTE — Progress Notes (Signed)
Today, Ramzey exercised at [employer]. [facility]. Service time was from 1330 to 1515.  The patient exercised for more than 31 minutes performing aerobic, strengthening, and stretching exercises. Oxygen saturation, heart rate, blood pressure, rate of perceived exertion, and shortness of breath were all monitored before, during, and after exercise. Dreshaun presented with no problems at today's exercise session. Lelan also attended an education session on exercise for the pulmonary patient.  There was no workload change during today's exercise session.  Pre-exercise vitals: . Weight kg: 96.8 . Liters of O2: ra . SpO2: 98 . HR: 65 . BP: 104/60 . CBG: na  Exercise vitals: . Highest heartrate:  76 . Lowest oxygen saturation: 97 . Highest blood pressure: 134/62 . Liters of 02: ra  Post-exercise vitals: . SpO2: 98 . HR: 64 . BP: 122/72 . Liters of O2: ra . CBG: na  Dr. Brand Males, Medical Director Dr. Candiss Norse is immediately available during today's Pulmonary Rehab session for LEKEITH WULF on 12/02/2014 at 1330 class time.

## 2014-12-03 ENCOUNTER — Emergency Department (HOSPITAL_BASED_OUTPATIENT_CLINIC_OR_DEPARTMENT_OTHER)
Admission: EM | Admit: 2014-12-03 | Discharge: 2014-12-03 | Disposition: A | Discharge status: Home / Self Care | Payer: Medicare Other | Attending: Emergency Medicine | Admitting: Emergency Medicine

## 2014-12-03 ENCOUNTER — Encounter (HOSPITAL_BASED_OUTPATIENT_CLINIC_OR_DEPARTMENT_OTHER): Payer: Self-pay | Admitting: Emergency Medicine

## 2014-12-03 ENCOUNTER — Emergency Department (HOSPITAL_BASED_OUTPATIENT_CLINIC_OR_DEPARTMENT_OTHER): Payer: Medicare Other

## 2014-12-03 DIAGNOSIS — Z7951 Long term (current) use of inhaled steroids: Secondary | ICD-10-CM | POA: Insufficient documentation

## 2014-12-03 DIAGNOSIS — Y9389 Activity, other specified: Secondary | ICD-10-CM | POA: Insufficient documentation

## 2014-12-03 DIAGNOSIS — I10 Essential (primary) hypertension: Secondary | ICD-10-CM | POA: Insufficient documentation

## 2014-12-03 DIAGNOSIS — Z8619 Personal history of other infectious and parasitic diseases: Secondary | ICD-10-CM | POA: Insufficient documentation

## 2014-12-03 DIAGNOSIS — S20219A Contusion of unspecified front wall of thorax, initial encounter: Secondary | ICD-10-CM | POA: Insufficient documentation

## 2014-12-03 DIAGNOSIS — J449 Chronic obstructive pulmonary disease, unspecified: Secondary | ICD-10-CM | POA: Diagnosis not present

## 2014-12-03 DIAGNOSIS — I4891 Unspecified atrial fibrillation: Secondary | ICD-10-CM | POA: Diagnosis not present

## 2014-12-03 DIAGNOSIS — Z880 Allergy status to penicillin: Secondary | ICD-10-CM | POA: Diagnosis not present

## 2014-12-03 DIAGNOSIS — W010XXA Fall on same level from slipping, tripping and stumbling without subsequent striking against object, initial encounter: Secondary | ICD-10-CM | POA: Diagnosis not present

## 2014-12-03 DIAGNOSIS — K219 Gastro-esophageal reflux disease without esophagitis: Secondary | ICD-10-CM | POA: Diagnosis not present

## 2014-12-03 DIAGNOSIS — E119 Type 2 diabetes mellitus without complications: Secondary | ICD-10-CM | POA: Diagnosis not present

## 2014-12-03 DIAGNOSIS — Z8611 Personal history of tuberculosis: Secondary | ICD-10-CM | POA: Diagnosis not present

## 2014-12-03 DIAGNOSIS — Z8546 Personal history of malignant neoplasm of prostate: Secondary | ICD-10-CM | POA: Diagnosis not present

## 2014-12-03 DIAGNOSIS — Y998 Other external cause status: Secondary | ICD-10-CM | POA: Insufficient documentation

## 2014-12-03 DIAGNOSIS — Z79899 Other long term (current) drug therapy: Secondary | ICD-10-CM | POA: Diagnosis not present

## 2014-12-03 DIAGNOSIS — R0602 Shortness of breath: Secondary | ICD-10-CM | POA: Diagnosis not present

## 2014-12-03 DIAGNOSIS — Y9289 Other specified places as the place of occurrence of the external cause: Secondary | ICD-10-CM | POA: Insufficient documentation

## 2014-12-03 DIAGNOSIS — Z9889 Other specified postprocedural states: Secondary | ICD-10-CM | POA: Insufficient documentation

## 2014-12-03 DIAGNOSIS — M1990 Unspecified osteoarthritis, unspecified site: Secondary | ICD-10-CM | POA: Diagnosis not present

## 2014-12-03 DIAGNOSIS — Z87891 Personal history of nicotine dependence: Secondary | ICD-10-CM | POA: Insufficient documentation

## 2014-12-03 DIAGNOSIS — Z7952 Long term (current) use of systemic steroids: Secondary | ICD-10-CM | POA: Insufficient documentation

## 2014-12-03 DIAGNOSIS — R0789 Other chest pain: Secondary | ICD-10-CM | POA: Diagnosis not present

## 2014-12-03 DIAGNOSIS — F419 Anxiety disorder, unspecified: Secondary | ICD-10-CM | POA: Diagnosis not present

## 2014-12-03 DIAGNOSIS — W19XXXA Unspecified fall, initial encounter: Secondary | ICD-10-CM

## 2014-12-03 DIAGNOSIS — S299XXA Unspecified injury of thorax, initial encounter: Secondary | ICD-10-CM | POA: Diagnosis not present

## 2014-12-03 DIAGNOSIS — S199XXA Unspecified injury of neck, initial encounter: Secondary | ICD-10-CM | POA: Diagnosis present

## 2014-12-03 MED ORDER — HYDROCODONE-ACETAMINOPHEN 5-325 MG PO TABS: 1.0000 | ORAL_TABLET | Freq: Four times a day (QID) | ORAL | Status: DC | PRN

## 2014-12-03 NOTE — ED Notes (Signed)
MD at bedside. 

## 2014-12-03 NOTE — ED Notes (Signed)
Patient transported to X-ray 

## 2014-12-03 NOTE — ED Notes (Signed)
Patient states that he feel from a sitting position. The patient reports that he has a Headache and neck pain. The patient reports that when he feel his "heart pulled apart", reports that he fell face forward. The patient denies LOC

## 2014-12-03 NOTE — Discharge Instructions (Signed)
Hydrocodone as prescribed as needed for pain.  Return to the emergency department if your symptoms substantially worsen or change.   Chest Contusion A chest contusion is a deep bruise on your chest area. Contusions are the result of an injury that caused bleeding under the skin. A chest contusion may involve bruising of the skin, muscles, or ribs. The contusion may turn blue, purple, or yellow. Minor injuries will give you a painless contusion, but more severe contusions may stay painful and swollen for a few weeks. CAUSES  A contusion is usually caused by a blow, trauma, or direct force to an area of the body. SYMPTOMS   Swelling and redness of the injured area.  Discoloration of the injured area.  Tenderness and soreness of the injured area.  Pain. DIAGNOSIS  The diagnosis can be made by taking a history and performing a physical exam. An X-ray, CT scan, or MRI may be needed to determine if there were any associated injuries, such as broken bones (fractures) or internal injuries. TREATMENT  Often, the best treatment for a chest contusion is resting, icing, and applying cold compresses to the injured area. Deep breathing exercises may be recommended to reduce the risk of pneumonia. Over-the-counter medicines may also be recommended for pain control. HOME CARE INSTRUCTIONS   Put ice on the injured area.  Put ice in a plastic bag.  Place a towel between your skin and the bag.  Leave the ice on for 15-20 minutes, 03-04 times a day.  Only take over-the-counter or prescription medicines as directed by your caregiver. Your caregiver may recommend avoiding anti-inflammatory medicines (aspirin, ibuprofen, and naproxen) for 48 hours because these medicines may increase bruising.  Rest the injured area.  Perform deep-breathing exercises as directed by your caregiver.  Stop smoking if you smoke.  Do not lift objects over 5 pounds (2.3 kg) for 3 days or longer if recommended by your  caregiver. SEEK IMMEDIATE MEDICAL CARE IF:   You have increased bruising or swelling.  You have pain that is getting worse.  You have difficulty breathing.  You have dizziness, weakness, or fainting.  You have blood in your urine or stool.  You cough up or vomit blood.  Your swelling or pain is not relieved with medicines. MAKE SURE YOU:   Understand these instructions.  Will watch your condition.  Will get help right away if you are not doing well or get worse. Document Released: 06/26/2001 Document Revised: 06/25/2012 Document Reviewed: 03/24/2012 [facility] Patient Information 2015 [place], [place]. This information is not intended to replace advice given to you by your health care provider. Make sure you discuss any questions you have with your health care provider.

## 2014-12-03 NOTE — ED Provider Notes (Signed)
CSN: [id number]     Arrival date & time 12/03/14  1908 History  This chart was scribed for Jonathon Speak, MD by Rayfield Citizen, ED Scribe. This patient was seen in room MH04/MH04 and the patient's care was started at 7:31 PM.    Chief Complaint  Patient presents with  . Neck Pain  . Otalgia   Patient is a 64 y.o. male presenting with neck pain and ear pain. The history is provided by the patient and the spouse. No language interpreter was used.  Neck Pain Otalgia   HPI Comments: [name] is a 64 y.o. male with past medical history of COPD, DM, afib (no prior stress test, no cardiac cath) who presents to the Emergency Department complaining of neck pain after a fall just PTA today. Patient explains he had a simple mechanical fall, chest first, onto concrete after attempting to catch himself when tripping over a speed bump. He drove himself home and began to notice some SOB. He now complains of some tenderness to his central chest, "difficulty swallowing, like something is in my throat," neck pain and tinnitus.   Past Medical History  Diagnosis Date  . Hypertension   . Prostate cancer   . GERD (gastroesophageal reflux disease)   . History of TB (tuberculosis)     1984--  hospitalized for 4 month treatment  . Post-polio syndrome     polio at age 81--PT WAS IN IRON LUNG; PT WAS IN W/C UNTIL AGE 28; STILL HAS WEAKNESS RIGHT SIDE  . History of chronic bronchitis   . History of rheumatic fever   . COPD, frequent exacerbations     pulmologist-  dr Joya Gaskins--  Gold Stage C  . Bladder outlet obstruction   . Anxiety disorder   . History of urinary retention   . Nocturnal oxygen desaturation     USES O2 NIGHTLY  . Prostate cancer   . Complication of anesthesia     DIFFICULT WAKING   . Diabetes mellitus without complication     BODERLINE - DIET CONTROL  . Anxiety   . Shortness of breath dyspnea     RIGHT HEMIDIAPHRAGM ELEVATION - POST POLIO SYNDROME  . Dysrhythmia     PVC'S  .  Arthritis     BILATERAL SHOULDERS, ELBOWS AND HANDS AND LEFT HIP AND KNEES--HX CORTISONE SHOTS IN SHOULDERS, ELBOWS, HIP AND KNEES   Past Surgical History  Procedure Laterality Date  . Other surgical history       Muscle & bone Graft/Polio  . Cystoscopy w/ cystogram/  transrectal ultrasound prostate bx  03-22-2009  . Cardiovascular stress test  06-08-2014  dr Mare Ferrari    normal lexiscan study/  no ischemia/  not gated due to PAC's  . Ureterosopy stone extraction  2000  . Nasal septum surgery  2000  . Shoulder arthroscopy with open rotator cuff repair Bilateral 2013  &  1999    removal spurs and bursectomy  . Laparoscopic cholecystectomy  2013  . Prostate biopsy N/A 09/28/2014    Procedure: PROSTATE ULTRASOUND/BIOPSY;  Surgeon: [name], MD;  Location: [facility];  Service: Urology;  Laterality: N/A;  . Transurethral resection of prostate N/A 09/28/2014    Procedure: TRANSURETHRAL RESECTION OF THE PROSTATE (TURP);  Surgeon: [name], MD;  Location: [facility];  Service: Urology;  Laterality: N/A;   Family History  Problem Relation Age of Onset  . Alzheimer's disease Father 4    Deceased  . Stomach cancer Father   .  Heart attack Father   . Heart disease Father   . Skin cancer Mother     Facial-Living  . Alcohol abuse Sister     x2  . Mental illness Sister     x2  . Diabetes Maternal Aunt     x2  . Thyroid disease Maternal Aunt     x4  . Diabetes Maternal Uncle   . Tuberculosis Paternal Grandfather   . Tuberculosis Paternal Grandmother   . Alzheimer's disease Paternal Aunt   . Alzheimer's disease Paternal Uncle    History  Substance Use Topics  . Smoking status: Former Smoker -- 73 years    Quit date: 08/25/2013  . Smokeless tobacco: Never Used     Comment: hx  smoke a pack of pipe tobacco(1.5oz) every 10 days.   . Alcohol Use: 12.6 oz/week    21 Cans of beer per week     Comment: average 3 beers per day    Review of Systems  A complete 10 system review of  systems was obtained and all systems are negative except as noted in the HPI and PMH.   Allergies  Ivp dye; Levaquin; Penicillins; Aspirin; Morphine and related; and Nsaids  Home Medications   Prior to Admission medications   Medication Sig Start Date End Date Taking? Authorizing Provider  albuterol (PROVENTIL HFA;VENTOLIN HFA) 108 (90 BASE) MCG/ACT inhaler Inhale 2 puffs into the lungs every 6 (six) hours as needed for wheezing or shortness of breath.    Historical Provider, MD  ALPRAZolam [name]) 1 MG tablet Take 1 tablet (1 mg total) by mouth 3 (three) times daily as needed for anxiety. Patient taking differently: Take 0.5 mg by mouth 3 (three) times daily as needed for anxiety.  06/03/14   Brunetta Jeans, PA-C  amLODipine (NORVASC) 10 MG tablet TAKE 1 TABLET (10 MG TOTAL) BY MOUTH DAILY. 10/29/14   Brunetta Jeans, PA-C  baclofen (LIORESAL) 10 MG tablet TAKE 1 TABLET (10 MG TOTAL) BY MOUTH 2 (TWO) TIMES DAILY. 10/29/14   Brunetta Jeans, PA-C  BREO ELLIPTA 100-25 MCG/INH AEPB 1 puff. daily 09/06/14   Historical Provider, MD  fluticasone (FLONASE) 50 MCG/ACT nasal spray Place 2 sprays into both nostrils daily. 11/22/14   Brunetta Jeans, PA-C  levalbuterol [facility] HFA) 45 MCG/ACT inhaler Inhale 2 puffs into the lungs every 6 (six) hours as needed for wheezing. Patient not taking: Reported on 11/25/2014 11/22/14   Brunetta Jeans, PA-C  Linaclotide [facility]) 145 MCG CAPS capsule Take 1 capsule (145 mcg total) by mouth daily. 11/22/14   Brunetta Jeans, PA-C  loratadine (CLARITIN) 10 MG tablet Take 10 mg by mouth daily as needed for allergies.    Historical Provider, MD  metoprolol succinate (TOPROL-XL) 25 MG 24 hr tablet Take 1 tablet (25 mg total) by mouth daily. 09/01/14   Brunetta Jeans, PA-C  montelukast (SINGULAIR) 10 MG tablet Take 1 tablet (10 mg total) by mouth at bedtime. 10/27/14   Elsie Stain, MD  omeprazole (PRILOSEC) 20 MG capsule Take 1 capsule (20 mg total) by mouth daily.  07/08/14   Elsie Stain, MD  oxyCODONE-acetaminophen (PERCOCET/ROXICET) 5-325 MG per tablet Take 1-2 tablets by mouth every 6 (six) hours as needed for severe pain. 10/31/14   Threasa Beards, MD  predniSONE (DELTASONE) 10 MG tablet 4 tabs for 3 days, then 3 tabs for 3 days, 2 tabs for 3 days, then 1 tab for 3 days, then stop 11/25/14  Melvenia Needles, NP  predniSONE (DELTASONE) 20 MG tablet 3 tabs po day one, then 2 tabs daily x 4 days 11/14/14   Evelina Bucy, MD  promethazine (PHENERGAN) 12.5 MG tablet Take 1 tablet (12.5 mg total) by mouth every 8 (eight) hours as needed for nausea or vomiting. 07/13/14   Brunetta Jeans, PA-C  tiotropium (SPIRIVA) 18 MCG inhalation capsule Place 18 mcg into inhaler and inhale daily. IN AM    Historical Provider, MD  zolpidem (AMBIEN) 5 MG tablet Take 1 tablet (5 mg total) by mouth at bedtime as needed for sleep. 10/06/14   Brunetta Jeans, PA-C   BP 150/77 mmHg  Pulse 75  Temp(Src) 98.5 F (36.9 C) (Oral)  Resp 16  Wt 213 lb (96.616 kg)  SpO2 100% Physical Exam  Constitutional: He is oriented to person, place, and time. He appears well-developed and well-nourished. No distress.  HENT:  Head: Normocephalic and atraumatic.  Mouth/Throat: Oropharynx is clear and moist. No oropharyngeal exudate.  Eyes: EOM are normal. Pupils are equal, round, and reactive to light.  Neck: Normal range of motion. Neck supple.  Cardiovascular: Normal rate, regular rhythm and normal heart sounds.  Exam reveals no gallop and no friction rub.   No murmur heard. Pulmonary/Chest: Effort normal. No respiratory distress. He has no wheezes. He has no rales. He exhibits tenderness (Tenderness over the mid sternum).  Abdominal: Soft. There is no tenderness. There is no rebound and no guarding.  Musculoskeletal: Normal range of motion. He exhibits no edema.  Neurological: He is alert and oriented to person, place, and time.  Skin: Skin is warm and dry. No rash noted.  Psychiatric:  He has a normal mood and affect. His behavior is normal.  Nursing note and vitals reviewed.   ED Course  Procedures   DIAGNOSTIC STUDIES: Oxygen Saturation is 100% on RA, normal by my interpretation.    COORDINATION OF CARE: 7:38 PM Discussed treatment plan with pt at bedside and pt agreed to plan.   Labs Review Labs Reviewed - No data to display  Imaging Review No results found.   EKG Interpretation None      MDM   Final diagnoses:  None    Xrays negative for fracture or ptx.  Will discharge with pain meds, prn return.  I personally performed the services described in this documentation, which was scribed in my presence. The recorded information has been reviewed and is accurate.       Jonathon Speak, MD 12/04/14 [phone]

## 2014-12-07 ENCOUNTER — Encounter (HOSPITAL_COMMUNITY): Payer: Medicare Other

## 2014-12-09 ENCOUNTER — Encounter (HOSPITAL_COMMUNITY): Payer: Medicare Other

## 2014-12-14 ENCOUNTER — Encounter (HOSPITAL_COMMUNITY): Payer: Medicare Other

## 2014-12-15 ENCOUNTER — Ambulatory Visit: Payer: Self-pay | Admitting: Critical Care Medicine

## 2014-12-16 ENCOUNTER — Encounter (HOSPITAL_COMMUNITY): Admission: RE | Admit: 2014-12-16 | Payer: Medicare Other | Source: Ambulatory Visit

## 2014-12-16 ENCOUNTER — Encounter: Payer: Medicare Other | Admitting: Medical

## 2014-12-16 NOTE — Progress Notes (Signed)
This encounter was created in error - please disregard.

## 2014-12-21 ENCOUNTER — Emergency Department (HOSPITAL_BASED_OUTPATIENT_CLINIC_OR_DEPARTMENT_OTHER)
Admission: EM | Admit: 2014-12-21 | Discharge: 2014-12-21 | Disposition: A | Discharge status: Home / Self Care | Payer: Medicare Other | Attending: Emergency Medicine | Admitting: Emergency Medicine

## 2014-12-21 ENCOUNTER — Encounter (HOSPITAL_BASED_OUTPATIENT_CLINIC_OR_DEPARTMENT_OTHER): Payer: Self-pay

## 2014-12-21 ENCOUNTER — Emergency Department (HOSPITAL_BASED_OUTPATIENT_CLINIC_OR_DEPARTMENT_OTHER): Payer: Medicare Other

## 2014-12-21 ENCOUNTER — Encounter (HOSPITAL_COMMUNITY): Payer: Medicare Other

## 2014-12-21 DIAGNOSIS — I10 Essential (primary) hypertension: Secondary | ICD-10-CM | POA: Diagnosis not present

## 2014-12-21 DIAGNOSIS — E119 Type 2 diabetes mellitus without complications: Secondary | ICD-10-CM | POA: Insufficient documentation

## 2014-12-21 DIAGNOSIS — Z79899 Other long term (current) drug therapy: Secondary | ICD-10-CM | POA: Diagnosis not present

## 2014-12-21 DIAGNOSIS — Z7952 Long term (current) use of systemic steroids: Secondary | ICD-10-CM | POA: Diagnosis not present

## 2014-12-21 DIAGNOSIS — Z8611 Personal history of tuberculosis: Secondary | ICD-10-CM | POA: Insufficient documentation

## 2014-12-21 DIAGNOSIS — Z8546 Personal history of malignant neoplasm of prostate: Secondary | ICD-10-CM | POA: Diagnosis not present

## 2014-12-21 DIAGNOSIS — Z7951 Long term (current) use of inhaled steroids: Secondary | ICD-10-CM | POA: Diagnosis not present

## 2014-12-21 DIAGNOSIS — Z9981 Dependence on supplemental oxygen: Secondary | ICD-10-CM | POA: Diagnosis not present

## 2014-12-21 DIAGNOSIS — J441 Chronic obstructive pulmonary disease with (acute) exacerbation: Secondary | ICD-10-CM | POA: Diagnosis not present

## 2014-12-21 DIAGNOSIS — Z87891 Personal history of nicotine dependence: Secondary | ICD-10-CM | POA: Diagnosis not present

## 2014-12-21 DIAGNOSIS — F419 Anxiety disorder, unspecified: Secondary | ICD-10-CM | POA: Insufficient documentation

## 2014-12-21 DIAGNOSIS — Z880 Allergy status to penicillin: Secondary | ICD-10-CM | POA: Diagnosis not present

## 2014-12-21 DIAGNOSIS — R0602 Shortness of breath: Secondary | ICD-10-CM | POA: Diagnosis not present

## 2014-12-21 DIAGNOSIS — Z8612 Personal history of poliomyelitis: Secondary | ICD-10-CM | POA: Diagnosis not present

## 2014-12-21 DIAGNOSIS — K219 Gastro-esophageal reflux disease without esophagitis: Secondary | ICD-10-CM | POA: Diagnosis not present

## 2014-12-21 DIAGNOSIS — Z87448 Personal history of other diseases of urinary system: Secondary | ICD-10-CM | POA: Insufficient documentation

## 2014-12-21 DIAGNOSIS — R079 Chest pain, unspecified: Secondary | ICD-10-CM | POA: Diagnosis not present

## 2014-12-21 DIAGNOSIS — J449 Chronic obstructive pulmonary disease, unspecified: Secondary | ICD-10-CM | POA: Diagnosis not present

## 2014-12-21 LAB — CBC WITH DIFFERENTIAL/PLATELET
BASOS ABS: 0 10*3/uL (ref 0.0–0.1)
Basophils Relative: 1 % (ref 0–1)
Eosinophils Absolute: 0.4 10*3/uL (ref 0.0–0.7)
Eosinophils Relative: 7 % — ABNORMAL HIGH (ref 0–5)
HEMATOCRIT: 43.3 % (ref 39.0–52.0)
Hemoglobin: 14.5 g/dL (ref 13.0–17.0)
LYMPHS PCT: 26 % (ref 12–46)
Lymphs Abs: 1.6 10*3/uL (ref 0.7–4.0)
MCH: 31.7 pg (ref 26.0–34.0)
MCHC: 33.5 g/dL (ref 30.0–36.0)
MCV: 94.5 fL (ref 78.0–100.0)
Monocytes Absolute: 0.5 10*3/uL (ref 0.1–1.0)
Monocytes Relative: 8 % (ref 3–12)
NEUTROS ABS: 3.7 10*3/uL (ref 1.7–7.7)
Neutrophils Relative %: 60 % (ref 43–77)
PLATELETS: 256 10*3/uL (ref 150–400)
RBC: 4.58 MIL/uL (ref 4.22–5.81)
RDW: 13.5 % (ref 11.5–15.5)
WBC: 6.1 10*3/uL (ref 4.0–10.5)

## 2014-12-21 LAB — BASIC METABOLIC PANEL
Anion gap: 11 (ref 5–15)
BUN: 15 mg/dL (ref 6–23)
CALCIUM: 9.2 mg/dL (ref 8.4–10.5)
CO2: 26 mmol/L (ref 19–32)
Chloride: 98 mmol/L (ref 96–112)
Creatinine, Ser: 1.19 mg/dL (ref 0.50–1.35)
GFR, EST AFRICAN AMERICAN: 72 mL/min — AB (ref >90)
GFR, EST NON AFRICAN AMERICAN: 62 mL/min — AB (ref >90)
Glucose, Bld: 121 mg/dL — ABNORMAL HIGH (ref 70–99)
Potassium: 3.9 mmol/L (ref 3.5–5.1)
Sodium: 135 mmol/L (ref 135–145)

## 2014-12-21 MED ORDER — LEVALBUTEROL HCL 1.25 MG/3ML IN NEBU
1.2500 mg | INHALATION_SOLUTION | Freq: Once | RESPIRATORY_TRACT | Status: DC
  Filled 2014-12-21: qty 0.5

## 2014-12-21 MED ORDER — SODIUM CHLORIDE 0.9 % IN NEBU
INHALATION_SOLUTION | RESPIRATORY_TRACT | Status: AC
  Administered 2014-12-21: 04:00:00
  Filled 2014-12-21: qty 3

## 2014-12-21 MED ORDER — LEVALBUTEROL HCL 1.25 MG/0.5ML IN NEBU
1.2500 mg | INHALATION_SOLUTION | Freq: Once | RESPIRATORY_TRACT | Status: AC
  Administered 2014-12-21: 1.25 mg via RESPIRATORY_TRACT

## 2014-12-21 MED ORDER — LEVALBUTEROL HCL 0.63 MG/3ML IN NEBU
0.6300 mg | INHALATION_SOLUTION | Freq: Once | RESPIRATORY_TRACT | Status: AC
  Administered 2014-12-21: 0.63 mg via RESPIRATORY_TRACT
  Filled 2014-12-21: qty 3

## 2014-12-21 NOTE — ED Notes (Signed)
Ambulatory back from xray, no dyspnea noted, alert, NAD< calm, steady gait.

## 2014-12-21 NOTE — ED Provider Notes (Signed)
CSN: [id number]     Arrival date & time 12/21/14  0219 History   First MD Initiated Contact with Patient 12/21/14 0320     Chief Complaint  Patient presents with  . Shortness of Breath     (Consider location/radiation/quality/duration/timing/severity/associated sxs/prior Treatment) HPI  This is a 64 year old male with COPD. He is here with a 2 day exacerbation of his COPD. His breathing is worse with exertion or lying flat. Symptoms are moderate to severe at their worst. He usually gets worse in the afternoons. He has doubled up on his [facility] and is taking prednisone, 20 milligrams daily, for which she has a prescription in case of acute exacerbations. He is here requesting a Xopenex neb treatment because his insurance will not pay for it at home. He has been using albuterol without adequate relief. He has had chest tightness, coughing and chest soreness with cough. He denies fever. He denies productive cough. He denies nausea or vomiting but did have diarrhea yesterday.  Past Medical History  Diagnosis Date  . Hypertension   . Prostate cancer   . GERD (gastroesophageal reflux disease)   . History of TB (tuberculosis)     1984--  hospitalized for 4 month treatment  . Post-polio syndrome     polio at age 43--PT WAS IN IRON LUNG; PT WAS IN W/C UNTIL AGE 20; STILL HAS WEAKNESS RIGHT SIDE  . History of chronic bronchitis   . History of rheumatic fever   . COPD, frequent exacerbations     pulmologist-  dr Joya Gaskins--  Gold Stage C  . Bladder outlet obstruction   . Anxiety disorder   . History of urinary retention   . Nocturnal oxygen desaturation     USES O2 NIGHTLY  . Prostate cancer   . Complication of anesthesia     DIFFICULT WAKING   . Diabetes mellitus without complication     BODERLINE - DIET CONTROL  . Anxiety   . Shortness of breath dyspnea     RIGHT HEMIDIAPHRAGM ELEVATION - POST POLIO SYNDROME  . Dysrhythmia     PVC'S  . Arthritis     BILATERAL SHOULDERS, ELBOWS AND  HANDS AND LEFT HIP AND KNEES--HX CORTISONE SHOTS IN SHOULDERS, ELBOWS, HIP AND KNEES   Past Surgical History  Procedure Laterality Date  . Other surgical history       Muscle & bone Graft/Polio  . Cystoscopy w/ cystogram/  transrectal ultrasound prostate bx  03-22-2009  . Cardiovascular stress test  06-08-2014  dr Mare Ferrari    normal lexiscan study/  no ischemia/  not gated due to PAC's  . Ureterosopy stone extraction  2000  . Nasal septum surgery  2000  . Shoulder arthroscopy with open rotator cuff repair Bilateral 2013  &  1999    removal spurs and bursectomy  . Laparoscopic cholecystectomy  2013  . Prostate biopsy N/A 09/28/2014    Procedure: PROSTATE ULTRASOUND/BIOPSY;  Surgeon: [name], MD;  Location: [facility];  Service: Urology;  Laterality: N/A;  . Transurethral resection of prostate N/A 09/28/2014    Procedure: TRANSURETHRAL RESECTION OF THE PROSTATE (TURP);  Surgeon: [name], MD;  Location: [facility];  Service: Urology;  Laterality: N/A;   Family History  Problem Relation Age of Onset  . Alzheimer's disease Father 4    Deceased  . Stomach cancer Father   . Heart attack Father   . Heart disease Father   . Skin cancer Mother     Facial-Living  .  Alcohol abuse Sister     x2  . Mental illness Sister     x2  . Diabetes Maternal Aunt     x2  . Thyroid disease Maternal Aunt     x4  . Diabetes Maternal Uncle   . Tuberculosis Paternal Grandfather   . Tuberculosis Paternal Grandmother   . Alzheimer's disease Paternal Aunt   . Alzheimer's disease Paternal Uncle    History  Substance Use Topics  . Smoking status: Former Smoker -- 31 years    Quit date: 08/25/2013  . Smokeless tobacco: Never Used     Comment: hx  smoke a pack of pipe tobacco(1.5oz) every 10 days.   . Alcohol Use: 12.6 oz/week    21 Cans of beer per week     Comment: average 3 beers per day    Review of Systems  All other systems reviewed and are negative.   Allergies  Ivp dye; Levaquin;  Penicillins; Aspirin; Morphine and related; and Nsaids  Home Medications   Prior to Admission medications   Medication Sig Start Date End Date Taking? Authorizing Provider  albuterol (PROVENTIL HFA;VENTOLIN HFA) 108 (90 BASE) MCG/ACT inhaler Inhale 2 puffs into the lungs every 6 (six) hours as needed for wheezing or shortness of breath.    Historical Provider, MD  ALPRAZolam [name]) 1 MG tablet Take 1 tablet (1 mg total) by mouth 3 (three) times daily as needed for anxiety. Patient taking differently: Take 0.5 mg by mouth 3 (three) times daily as needed for anxiety.  06/03/14   Brunetta Jeans, PA-C  amLODipine (NORVASC) 10 MG tablet TAKE 1 TABLET (10 MG TOTAL) BY MOUTH DAILY. 10/29/14   Brunetta Jeans, PA-C  baclofen (LIORESAL) 10 MG tablet TAKE 1 TABLET (10 MG TOTAL) BY MOUTH 2 (TWO) TIMES DAILY. 10/29/14   Brunetta Jeans, PA-C  BREO ELLIPTA 100-25 MCG/INH AEPB 1 puff. daily 09/06/14   Historical Provider, MD  fluticasone (FLONASE) 50 MCG/ACT nasal spray Place 2 sprays into both nostrils daily. 11/22/14   Brunetta Jeans, PA-C  HYDROcodone-acetaminophen (NORCO) 5-325 MG per tablet Take 1-2 tablets by mouth every 6 (six) hours as needed. 12/03/14   Veryl Speak, MD  levalbuterol [facility] HFA) 45 MCG/ACT inhaler Inhale 2 puffs into the lungs every 6 (six) hours as needed for wheezing. Patient not taking: Reported on 11/25/2014 11/22/14   Brunetta Jeans, PA-C  Linaclotide [facility]) 145 MCG CAPS capsule Take 1 capsule (145 mcg total) by mouth daily. 11/22/14   Brunetta Jeans, PA-C  loratadine (CLARITIN) 10 MG tablet Take 10 mg by mouth daily as needed for allergies.    Historical Provider, MD  metoprolol succinate (TOPROL-XL) 25 MG 24 hr tablet Take 1 tablet (25 mg total) by mouth daily. 09/01/14   Brunetta Jeans, PA-C  montelukast (SINGULAIR) 10 MG tablet Take 1 tablet (10 mg total) by mouth at bedtime. 10/27/14   Elsie Stain, MD  omeprazole (PRILOSEC) 20 MG capsule Take 1 capsule (20 mg  total) by mouth daily. 07/08/14   Elsie Stain, MD  oxyCODONE-acetaminophen (PERCOCET/ROXICET) 5-325 MG per tablet Take 1-2 tablets by mouth every 6 (six) hours as needed for severe pain. 10/31/14   Alfonzo Beers, MD  predniSONE (DELTASONE) 10 MG tablet 4 tabs for 3 days, then 3 tabs for 3 days, 2 tabs for 3 days, then 1 tab for 3 days, then stop 11/25/14   Tammy S Parrett, NP  predniSONE (DELTASONE) 20 MG tablet 3 tabs po  day one, then 2 tabs daily x 4 days 11/14/14   Evelina Bucy, MD  promethazine (PHENERGAN) 12.5 MG tablet Take 1 tablet (12.5 mg total) by mouth every 8 (eight) hours as needed for nausea or vomiting. 07/13/14   Brunetta Jeans, PA-C  tiotropium (SPIRIVA) 18 MCG inhalation capsule Place 18 mcg into inhaler and inhale daily. IN AM    Historical Provider, MD  zolpidem (AMBIEN) 5 MG tablet Take 1 tablet (5 mg total) by mouth at bedtime as needed for sleep. 10/06/14   Brunetta Jeans, PA-C   BP 161/66 mmHg  Pulse 73  Resp 19  Ht 6\' 1"  (1.854 m)  Wt 214 lb (97.07 kg)  BMI 28.24 kg/m2  SpO2 99%   Physical Exam  General: Well-developed, well-nourished male in no acute distress; appearance consistent with age of record HENT: normocephalic; atraumatic Eyes: pupils equal, round and reactive to light; extraocular muscles intact Neck: supple Heart: regular rate and rhythm Lungs: Shallow breath; no wheezing Abdomen: soft; nondistended; nontender; bowel sounds present Extremities: No deformity; full range of motion; pulses normal Neurologic: Awake, alert and oriented; motor function intact in all extremities and symmetric; no facial droop Skin: Warm and dry Psychiatric: Normal mood and affect   ED Course  Procedures (including critical care time)   MDM   Nursing notes and vitals signs, including pulse oximetry, reviewed.  Summary of this visit's results, reviewed by myself:   EKG Interpretation  Date/Time:  Tuesday December 21 2014 03:50:36 EST Ventricular Rate:  61 PR  Interval:  198 QRS Duration: 90 QT Interval:  422 QTC Calculation: 424 R Axis:   22 Text Interpretation:  Normal sinus rhythm Normal ECG No significant change was found Confirmed by Olegario Emberson  MD, Jenny Reichmann ([id number]) on 12/21/2014 4:03:08 AM       Labs:  Results for orders placed or performed during the hospital encounter of 12/21/14 (from the past 24 hour(s))  CBC with Differential     Status: Abnormal   Collection Time: 12/21/14  2:35 AM  Result Value Ref Range   WBC 6.1 4.0 - 10.5 K/uL   RBC 4.58 4.22 - 5.81 MIL/uL   Hemoglobin 14.5 13.0 - 17.0 g/dL   HCT 43.3 39.0 - 52.0 %   MCV 94.5 78.0 - 100.0 fL   MCH 31.7 26.0 - 34.0 pg   MCHC 33.5 30.0 - 36.0 g/dL   RDW 13.5 11.5 - 15.5 %   Platelets 256 150 - 400 K/uL   Neutrophils Relative % 60 43 - 77 %   Neutro Abs 3.7 1.7 - 7.7 K/uL   Lymphocytes Relative 26 12 - 46 %   Lymphs Abs 1.6 0.7 - 4.0 K/uL   Monocytes Relative 8 3 - 12 %   Monocytes Absolute 0.5 0.1 - 1.0 K/uL   Eosinophils Relative 7 (H) 0 - 5 %   Eosinophils Absolute 0.4 0.0 - 0.7 K/uL   Basophils Relative 1 0 - 1 %   Basophils Absolute 0.0 0.0 - 0.1 K/uL  Basic metabolic panel     Status: Abnormal   Collection Time: 12/21/14  2:35 AM  Result Value Ref Range   Sodium 135 135 - 145 mmol/L   Potassium 3.9 3.5 - 5.1 mmol/L   Chloride 98 96 - 112 mmol/L   CO2 26 19 - 32 mmol/L   Glucose, Bld 121 (H) 70 - 99 mg/dL   BUN 15 6 - 23 mg/dL   Creatinine, Ser 1.19 0.50 - 1.35 mg/dL  Calcium 9.2 8.4 - 10.5 mg/dL   GFR calc non Af Amer 62 (L) >90 mL/min   GFR calc Af Amer 72 (L) >90 mL/min   Anion gap 11 5 - 15    Imaging Studies: Dg Chest 2 View  12/21/2014   CLINICAL DATA:  Shortness of breath and chest pain. COPD exacerbation for 3 days.  EXAM: CHEST  2 VIEW  COMPARISON:  12/03/2014  FINDINGS: Slightly shallow inspiration. Normal heart size and pulmonary vascularity. No focal airspace disease or consolidation in the lungs. No blunting of costophrenic angles. No pneumothorax.  Mediastinal contours appear intact. Degenerative changes in the spine. Surgical clips in the right upper quadrant.  IMPRESSION: No active cardiopulmonary disease.   Electronically Signed   By: Lucienne Capers M.D.   On: 12/21/2014 02:58   5:37 AM Air movement improved, depth of breathing improved, patient feels better after Xopenex treatment 2. He has an appointment with his PCP tomorrow. He has an appointment with his pulmonologist the day after tomorrow.  Shanon Rosser, MD 12/21/14 [phone]

## 2014-12-21 NOTE — ED Notes (Signed)
Pt c/o COPD exacerbation x3days, states used his neb tx's and O2 with some relief; states productive cough. Pt speaking in complete sentences, no distress noted

## 2014-12-21 NOTE — ED Notes (Signed)
"  feel better, ready to go", breathing easier, given inhaler, NAD, calm, interactive, no dyspnea noted, VSS, out to de/c desk. Denies questions or needs.

## 2014-12-21 NOTE — ED Notes (Addendum)
Pt amiable, alert, NAD, calm, interactive, pleasant, talkative. Updated. Denies questions or needs. "feel better".

## 2014-12-22 ENCOUNTER — Ambulatory Visit (INDEPENDENT_AMBULATORY_CARE_PROVIDER_SITE_OTHER): Payer: Medicare Other | Admitting: Physician Assistant

## 2014-12-22 ENCOUNTER — Telehealth: Payer: Self-pay | Admitting: Critical Care Medicine

## 2014-12-22 ENCOUNTER — Encounter: Payer: Self-pay | Admitting: Physician Assistant

## 2014-12-22 VITALS — BP 129/78 | HR 64 | Temp 97.4°F | Resp 16 | Ht 73.0 in | Wt 215.4 lb

## 2014-12-22 DIAGNOSIS — G14 Postpolio syndrome: Secondary | ICD-10-CM

## 2014-12-22 DIAGNOSIS — J441 Chronic obstructive pulmonary disease with (acute) exacerbation: Secondary | ICD-10-CM | POA: Diagnosis not present

## 2014-12-22 NOTE — Progress Notes (Signed)
Pre visit review using our clinic review tool, if applicable. No additional management support is needed unless otherwise documented below in the visit note/SLS  

## 2014-12-22 NOTE — Telephone Encounter (Signed)
Pt recently in ED.  Per Dr. Joya Gaskins, pt needs OV soon with either him or TP.   Pt has an appt pending with Dr. Joya Gaskins on 01/10/15. [email or web address] for pt on home and cell #s - How is pt's breathing? Is pt ok with pending March 28 appt or would he like to come in sooner?

## 2014-12-22 NOTE — Telephone Encounter (Signed)
Patient says that he is feeling fine.  He is scheduled to come in and see TP on Monday afternoon 3/14.  Nothing further needed.

## 2014-12-23 ENCOUNTER — Encounter (HOSPITAL_COMMUNITY): Payer: Medicare Other

## 2014-12-23 ENCOUNTER — Telehealth (HOSPITAL_COMMUNITY): Payer: Self-pay | Admitting: *Deleted

## 2014-12-27 ENCOUNTER — Ambulatory Visit: Payer: Self-pay | Admitting: Adult Health

## 2014-12-28 ENCOUNTER — Encounter (HOSPITAL_COMMUNITY): Admission: RE | Admit: 2014-12-28 | Payer: Medicare Other | Source: Ambulatory Visit

## 2014-12-29 NOTE — Progress Notes (Signed)
Patient presents to clinic today for ER follow-up of COPD exacerbation. Patient was seen in ER on 12/21/2014 with complaints of shortness of breath.  Workup unremarkable for acute cardiopulmonary illness. Patient was given multiple neb treatments with significant improvement in symptoms. Was discharged with follow-up with pulmonary.Patient states he has been doing better overall. Having some difficulty getting his Xopenex inhaler approved by his new insurance. Has had to rely on his albuterol inhaler, which is subtherapeutic most of the time. Has upcoming follow-up appointment with pulmonary. Denies chest pain, shortness of breath at present time. Is currently in cardiopulmonary rehabilitation.   Patient wishes to discuss this therapy for strengthening exercises, given his history of polio and postpolio syndrome affecting his right lower extremity.  Past Medical History  Diagnosis Date  . Hypertension   . Prostate cancer   . GERD (gastroesophageal reflux disease)   . History of TB (tuberculosis)     1984--  hospitalized for 4 month treatment  . Post-polio syndrome     polio at age 29--PT WAS IN IRON LUNG; PT WAS IN W/C UNTIL AGE 6; STILL HAS WEAKNESS RIGHT SIDE  . History of chronic bronchitis   . History of rheumatic fever   . COPD, frequent exacerbations     pulmologist-  dr Joya Gaskins--  Gold Stage C  . Bladder outlet obstruction   . Anxiety disorder   . History of urinary retention   . Nocturnal oxygen desaturation     USES O2 NIGHTLY  . Prostate cancer   . Complication of anesthesia     DIFFICULT WAKING   . Diabetes mellitus without complication     BODERLINE - DIET CONTROL  . Anxiety   . Shortness of breath dyspnea     RIGHT HEMIDIAPHRAGM ELEVATION - POST POLIO SYNDROME  . Dysrhythmia     PVC'S  . Arthritis     BILATERAL SHOULDERS, ELBOWS AND HANDS AND LEFT HIP AND KNEES--HX CORTISONE SHOTS IN SHOULDERS, ELBOWS, HIP AND KNEES    Current Outpatient Prescriptions on File  Prior to Visit  Medication Sig Dispense Refill  . ALPRAZolam (XANAX) 1 MG tablet Take 1 tablet (1 mg total) by mouth 3 (three) times daily as needed for anxiety. (Patient taking differently: Take 0.5 mg by mouth 3 (three) times daily as needed for anxiety. ) 90 tablet 0  . amLODipine (NORVASC) 10 MG tablet TAKE 1 TABLET (10 MG TOTAL) BY MOUTH DAILY. 90 tablet 0  . baclofen (LIORESAL) 10 MG tablet TAKE 1 TABLET (10 MG TOTAL) BY MOUTH 2 (TWO) TIMES DAILY. 60 tablet 2  . BREO ELLIPTA 100-25 MCG/INH AEPB 1 puff. daily  1  . fluticasone (FLONASE) 50 MCG/ACT nasal spray Place 2 sprays into both nostrils daily. 16 g 12  . HYDROcodone-acetaminophen (NORCO) 5-325 MG per tablet Take 1-2 tablets by mouth every 6 (six) hours as needed. 15 tablet 0  . levalbuterol (XOPENEX HFA) 45 MCG/ACT inhaler Inhale 2 puffs into the lungs every 6 (six) hours as needed for wheezing. 1 Inhaler 12  . loratadine (CLARITIN) 10 MG tablet Take 10 mg by mouth daily as needed for allergies.    . metoprolol succinate (TOPROL-XL) 25 MG 24 hr tablet Take 1 tablet (25 mg total) by mouth daily. 30 tablet 3  . montelukast (SINGULAIR) 10 MG tablet Take 1 tablet (10 mg total) by mouth at bedtime. 30 tablet 5  . omeprazole (PRILOSEC) 20 MG capsule Take 1 capsule (20 mg total) by mouth daily. 30 capsule 4  .  oxyCODONE-acetaminophen (PERCOCET/ROXICET) 5-325 MG per tablet Take 1-2 tablets by mouth every 6 (six) hours as needed for severe pain. 15 tablet 0  . promethazine (PHENERGAN) 12.5 MG tablet Take 1 tablet (12.5 mg total) by mouth every 8 (eight) hours as needed for nausea or vomiting. 30 tablet 0  . tiotropium (SPIRIVA) 18 MCG inhalation capsule Place 18 mcg into inhaler and inhale daily. IN AM    . zolpidem (AMBIEN) 5 MG tablet Take 1 tablet (5 mg total) by mouth at bedtime as needed for sleep. 15 tablet 1  . Linaclotide (LINZESS) 145 MCG CAPS capsule Take 1 capsule (145 mcg total) by mouth daily. (Patient not taking: Reported on  12/22/2014) 30 capsule 1   No current facility-administered medications on file prior to visit.    Allergies  Allergen Reactions  . Ivp Dye [Iodinated Diagnostic Agents] Anaphylaxis  . Levaquin [Levofloxacin In D5w] Shortness Of Breath    In addition: sweating, chest pain, and diarrhea.   . Penicillins Anaphylaxis    Heart stops  . Aspirin Other (See Comments)    Reaction unknown  . Morphine And Related Nausea And Vomiting  . Nsaids Other (See Comments)    Difficulty breathing    Family History  Problem Relation Age of Onset  . Alzheimer's disease Father 4    Deceased  . Stomach cancer Father   . Heart attack Father   . Heart disease Father   . Skin cancer Mother     Facial-Living  . Alcohol abuse Sister     x2  . Mental illness Sister     x2  . Diabetes Maternal Aunt     x2  . Thyroid disease Maternal Aunt     x4  . Diabetes Maternal Uncle   . Tuberculosis Paternal Grandfather   . Tuberculosis Paternal Grandmother   . Alzheimer's disease Paternal Aunt   . Alzheimer's disease Paternal Uncle     History   Social History  . Marital Status: Married    Spouse Name: N/A  . Number of Children: N/A  . Years of Education: N/A   Social History Main Topics  . Smoking status: Former Smoker -- 6 years    Quit date: 08/25/2013  . Smokeless tobacco: Never Used     Comment: hx  smoke a pack of pipe tobacco(1.5oz) every 10 days.   . Alcohol Use: 12.6 oz/week    21 Cans of beer per week     Comment: average 3 beers per day  . Drug Use: No  . Sexual Activity: Not on file   Other Topics Concern  . None   Social History Narrative    Review of Systems - See HPI.  All other ROS are negative.  BP 129/78 mmHg  Pulse 64  Temp(Src) 97.4 F (36.3 C) (Oral)  Resp 16  Ht 6' 1" (1.854 m)  Wt 215 lb 6 oz (97.693 kg)  BMI 28.42 kg/m2  SpO2 100%  Physical Exam  Constitutional: He is oriented to person, place, and time and well-developed, well-nourished, and in no  distress.  HENT:  Head: Normocephalic and atraumatic.  Right Ear: External ear normal.  Left Ear: External ear normal.  Nose: Nose normal.  Mouth/Throat: Oropharynx is clear and moist. No oropharyngeal exudate.  Tympanic membranes within normal limits.  Eyes: Conjunctivae are normal. Pupils are equal, round, and reactive to light.  Neck: Neck supple.  Cardiovascular: Normal rate, regular rhythm, normal heart sounds and intact distal pulses.   Pulmonary/Chest:  Effort normal and breath sounds normal. No respiratory distress. He has no wheezes. He has no rales. He exhibits no tenderness.  Neurological: He is alert and oriented to person, place, and time.  Skin: Skin is warm and dry. No rash noted.  Psychiatric: Affect normal.  Vitals reviewed.   Recent Results (from the past 2160 hour(s))  CBC with Differential     Status: None   Collection Time: 10/01/14  8:25 PM  Result Value Ref Range   WBC 6.9 4.0 - 10.5 K/uL   RBC 4.22 4.22 - 5.81 MIL/uL   Hemoglobin 13.5 13.0 - 17.0 g/dL   HCT 40.6 39.0 - 52.0 %   MCV 96.2 78.0 - 100.0 fL   MCH 32.0 26.0 - 34.0 pg   MCHC 33.3 30.0 - 36.0 g/dL   RDW 14.0 11.5 - 15.5 %   Platelets 242 150 - 400 K/uL   Neutrophils Relative % 60 43 - 77 %   Neutro Abs 4.1 1.7 - 7.7 K/uL   Lymphocytes Relative 25 12 - 46 %   Lymphs Abs 1.7 0.7 - 4.0 K/uL   Monocytes Relative 12 3 - 12 %   Monocytes Absolute 0.9 0.1 - 1.0 K/uL   Eosinophils Relative 3 0 - 5 %   Eosinophils Absolute 0.2 0.0 - 0.7 K/uL   Basophils Relative 0 0 - 1 %   Basophils Absolute 0.0 0.0 - 0.1 K/uL  Comprehensive metabolic panel     Status: Abnormal   Collection Time: 10/01/14  8:25 PM  Result Value Ref Range   Sodium 138 137 - 147 mEq/L   Potassium 4.1 3.7 - 5.3 mEq/L   Chloride 100 96 - 112 mEq/L   CO2 24 19 - 32 mEq/L   Glucose, Bld 92 70 - 99 mg/dL   BUN 18 6 - 23 mg/dL   Creatinine, Ser 1.22 0.50 - 1.35 mg/dL   Calcium 9.4 8.4 - 10.5 mg/dL   Total Protein 7.0 6.0 - 8.3 g/dL     Albumin 3.9 3.5 - 5.2 g/dL   AST 27 0 - 37 U/L   ALT 30 0 - 53 U/L   Alkaline Phosphatase 62 39 - 117 U/L   Total Bilirubin 0.5 0.3 - 1.2 mg/dL   GFR calc non Af Amer 61 (L) >90 mL/min   GFR calc Af Amer 71 (L) >90 mL/min    Comment: (NOTE) The eGFR has been calculated using the CKD EPI equation. This calculation has not been validated in all clinical situations. eGFR's persistently <90 mL/min signify possible Chronic Kidney Disease.    Anion gap 14 5 - 15  Urinalysis, Routine w reflex microscopic     Status: Abnormal   Collection Time: 10/01/14  9:34 PM  Result Value Ref Range   Color, Urine YELLOW YELLOW   APPearance CLOUDY (A) CLEAR   Specific Gravity, Urine 1.018 1.005 - 1.030   pH 5.5 5.0 - 8.0   Glucose, UA NEGATIVE NEGATIVE mg/dL   Hgb urine dipstick LARGE (A) NEGATIVE   Bilirubin Urine NEGATIVE NEGATIVE   Ketones, ur NEGATIVE NEGATIVE mg/dL   Protein, ur 100 (A) NEGATIVE mg/dL   Urobilinogen, UA 0.2 0.0 - 1.0 mg/dL   Nitrite NEGATIVE NEGATIVE   Leukocytes, UA TRACE (A) NEGATIVE  Urine microscopic-add on     Status: Abnormal   Collection Time: 10/01/14  9:34 PM  Result Value Ref Range   Squamous Epithelial / LPF RARE RARE   WBC, UA 0-2 <3 WBC/hpf  RBC / HPF TOO NUMEROUS TO COUNT <3 RBC/hpf   Bacteria, UA RARE RARE   Casts GRANULAR CAST (A) NEGATIVE  Rheumatoid Factor     Status: None   Collection Time: 10/06/14 11:47 AM  Result Value Ref Range   Rhuematoid fact SerPl-aCnc <10 <=14 IU/mL    Comment:                            Interpretive Table                     Low Positive: 15 - 41 IU/mL                     High Positive:  >= 42 IU/mL    In addition to the RF result, and clinical symptoms including joint  involvement, the 2010 ACR Classification Criteria for  scoring/diagnosing Rheumatoid Arthritis include the results of the  following tests:  CRP (63875), ESR (15010), and CCP (APCA) (64332).   [email or web address]/WC_[email or web address]   Cyclic citrul peptide antibody, IgG     Status: None   Collection Time: 10/06/14 11:47 AM  Result Value Ref Range   Cyclic Citrullin Peptide Ab <2.0 0.0 - 5.0 U/mL    Comment:                              Interpretive Table                       Low Positive:  5.1 - 14.9 U/mL                       High Positive:  >= 15.0 U/mL   In addition to the CCP (APCA) result, and clinical symptoms including joint involvement, the 2010 ACR Classification Criteria for scoring/diagnosing Rheumatoid Arthritis include the results of the following tests: RF (51761), CRP (60737), and ESR (15010). www.rheumatology.org/practice/clinical/classification/ra/ra_2010.asp   Urinalysis, Routine w reflex microscopic     Status: Abnormal   Collection Time: 10/11/14  1:10 AM  Result Value Ref Range   Color, Urine RED (A) YELLOW    Comment: BIOCHEMICALS MAY BE AFFECTED BY COLOR   APPearance CLOUDY (A) CLEAR   Specific Gravity, Urine 1.020 1.005 - 1.030   pH 5.5 5.0 - 8.0   Glucose, UA NEGATIVE NEGATIVE mg/dL   Hgb urine dipstick LARGE (A) NEGATIVE   Bilirubin Urine NEGATIVE NEGATIVE   Ketones, ur 15 (A) NEGATIVE mg/dL   Protein, ur 100 (A) NEGATIVE mg/dL   Urobilinogen, UA 0.2 0.0 - 1.0 mg/dL   Nitrite NEGATIVE NEGATIVE   Leukocytes, UA MODERATE (A) NEGATIVE  Urine microscopic-add on     Status: Abnormal   Collection Time: 10/11/14  1:10 AM  Result Value Ref Range   Squamous Epithelial / LPF FEW (A) RARE   WBC, UA 7-10 <3 WBC/hpf   RBC / HPF TOO NUMEROUS TO COUNT <3 RBC/hpf   Bacteria, UA FEW (A) RARE  Urinalysis, Routine w reflex microscopic     Status: Abnormal   Collection Time: 10/31/14  5:24 PM  Result Value Ref Range   Color, Urine YELLOW YELLOW   APPearance CLOUDY (A) CLEAR   Specific Gravity, Urine 1.021 1.005 - 1.030   pH 5.5 5.0 - 8.0   Glucose, UA NEGATIVE NEGATIVE mg/dL   Hgb urine dipstick MODERATE (A) NEGATIVE  Bilirubin Urine NEGATIVE NEGATIVE   Ketones, ur 15 (A) NEGATIVE mg/dL   Protein, ur 30 (A) NEGATIVE mg/dL   Urobilinogen, UA 0.2 0.0 - 1.0 mg/dL   Nitrite NEGATIVE NEGATIVE   Leukocytes, UA LARGE (A) NEGATIVE  Urine microscopic-add on     Status: Abnormal   Collection Time: 10/31/14  5:24 PM  Result Value Ref Range   Squamous Epithelial / LPF RARE RARE   WBC, UA TOO NUMEROUS TO COUNT <3 WBC/hpf   RBC / HPF 11-20 <3 RBC/hpf   Bacteria, UA MANY (A) RARE   Urine-Other MUCOUS PRESENT   Urine culture     Status: None   Collection Time: 10/31/14  5:24 PM  Result Value Ref Range   Specimen Description URINE, RANDOM    Special Requests NONE    Colony Count NO GROWTH Performed at [facility]     Culture NO GROWTH Performed at [facility]     Report Status 11/01/2014 FINAL   CBC     Status: None   Collection Time: 10/31/14  5:40 PM  Result Value Ref Range   WBC 6.9 4.0 - 10.5 K/uL   RBC 4.46 4.22 - 5.81 MIL/uL   Hemoglobin 14.1 13.0 - 17.0 g/dL   HCT 41.8 39.0 - 52.0 %   MCV 93.7 78.0 - 100.0 fL   MCH 31.6 26.0 - 34.0 pg   MCHC 33.7 30.0 - 36.0 g/dL   RDW 12.9 11.5 - 15.5 %   Platelets 265 150 - 400 K/uL  Comprehensive metabolic panel     Status: Abnormal   Collection Time: 10/31/14  5:40 PM  Result Value Ref Range   Sodium 136 135 - 145 mmol/L    Comment: Please note change in reference range.   Potassium 4.0 3.5 - 5.1 mmol/L    Comment: Please note change in reference range.   Chloride 105 96 - 112 mEq/L   CO2 26 19 - 32 mmol/L   Glucose, Bld 105 (H) 70 - 99 mg/dL   BUN 18 6 - 23 mg/dL   Creatinine, Ser 1.54 (H) 0.50 - 1.35 mg/dL   Calcium 9.0 8.4 - 10.5 mg/dL   Total Protein 7.6 6.0 - 8.3 g/dL   Albumin 4.4 3.5 - 5.2 g/dL   AST 25 0 - 37 U/L   ALT 29 0 - 53 U/L   Alkaline Phosphatase 67 39 - 117 U/L   Total Bilirubin 0.8 0.3 - 1.2 mg/dL   GFR calc non Af Amer 46 (L) >90 mL/min   GFR calc Af Amer 53 (L) >90 mL/min    Comment: (NOTE) The eGFR has  been calculated using the CKD EPI equation. This calculation has not been validated in all clinical situations. eGFR's persistently <90 mL/min signify possible Chronic Kidney Disease.    Anion gap 5 5 - 15  Lipase, blood     Status: None   Collection Time: 10/31/14  5:40 PM  Result Value Ref Range   Lipase 37 11 - 59 U/L  POCT urinalysis dipstick     Status: Abnormal   Collection Time: 11/03/14 11:36 AM  Result Value Ref Range   Color, UA gold    Clarity, UA cloudy    Glucose, UA neg    Bilirubin, UA neg    Ketones, UA neg    Spec Grav, UA >=1.030    Blood, UA Moderate (2+)    pH, UA 5.0    Protein, UA positive  Urobilinogen, UA 1.0    Nitrite, UA neg    Leukocytes, UA small (1+)   CBC     Status: None   Collection Time: 11/03/14 11:58 AM  Result Value Ref Range   WBC 6.1 4.0 - 10.5 K/uL   RBC 4.36 4.22 - 5.81 Mil/uL   Platelets 254.0 150.0 - 400.0 K/uL   Hemoglobin 13.7 13.0 - 17.0 g/dL   HCT 40.5 39.0 - 52.0 %   MCV 93.0 78.0 - 100.0 fl   MCHC 33.9 30.0 - 36.0 g/dL   RDW 13.8 11.5 - 15.5 %  Comp Met (CMET)     Status: Abnormal   Collection Time: 11/03/14 11:58 AM  Result Value Ref Range   Sodium 138 135 - 145 mEq/L   Potassium 4.3 3.5 - 5.1 mEq/L   Chloride 107 96 - 112 mEq/L   CO2 29 19 - 32 mEq/L   Glucose, Bld 103 (H) 70 - 99 mg/dL   BUN 19 6 - 23 mg/dL   Creatinine, Ser 1.12 0.40 - 1.50 mg/dL   Total Bilirubin 0.3 0.2 - 1.2 mg/dL   Alkaline Phosphatase 65 39 - 117 U/L   AST 20 0 - 37 U/L   ALT 28 0 - 53 U/L   Total Protein 6.8 6.0 - 8.3 g/dL   Albumin 4.1 3.5 - 5.2 g/dL   Calcium 9.3 8.4 - 10.5 mg/dL   GFR 69.94 >60.00 mL/min  CULTURE, URINE COMPREHENSIVE     Status: None   Collection Time: 11/03/14 11:58 AM  Result Value Ref Range   Colony Count NO GROWTH    Organism ID, Bacteria NO GROWTH   Urine Microscopic     Status: Abnormal   Collection Time: 11/03/14 11:58 AM  Result Value Ref Range   WBC, UA 21-50/hpf (A) 0-2/hpf   RBC / HPF 3-6/hpf (A)  0-2/hpf   Squamous Epithelial / LPF Rare(0-4/hpf) Rare(0-4/hpf)  CBC with Differential/Platelet     Status: Abnormal   Collection Time: 11/14/14  6:25 AM  Result Value Ref Range   WBC 7.1 4.0 - 10.5 K/uL   RBC 4.67 4.22 - 5.81 MIL/uL   Hemoglobin 14.7 13.0 - 17.0 g/dL   HCT 44.0 39.0 - 52.0 %   MCV 94.2 78.0 - 100.0 fL   MCH 31.5 26.0 - 34.0 pg   MCHC 33.4 30.0 - 36.0 g/dL   RDW 13.0 11.5 - 15.5 %   Platelets 260 150 - 400 K/uL   Neutrophils Relative % 58 43 - 77 %   Neutro Abs 4.2 1.7 - 7.7 K/uL   Lymphocytes Relative 28 12 - 46 %   Lymphs Abs 2.0 0.7 - 4.0 K/uL   Monocytes Relative 7 3 - 12 %   Monocytes Absolute 0.5 0.1 - 1.0 K/uL   Eosinophils Relative 6 (H) 0 - 5 %   Eosinophils Absolute 0.5 0.0 - 0.7 K/uL   Basophils Relative 1 0 - 1 %   Basophils Absolute 0.0 0.0 - 0.1 K/uL  Basic metabolic panel     Status: Abnormal   Collection Time: 11/14/14  6:25 AM  Result Value Ref Range   Sodium 136 135 - 145 mmol/L   Potassium 4.2 3.5 - 5.1 mmol/L   Chloride 106 96 - 112 mmol/L   CO2 26 19 - 32 mmol/L   Glucose, Bld 122 (H) 70 - 99 mg/dL   BUN 16 6 - 23 mg/dL   Creatinine, Ser 1.16 0.50 - 1.35 mg/dL  Calcium 9.2 8.4 - 10.5 mg/dL   GFR calc non Af Amer 64 (L) >90 mL/min   GFR calc Af Amer 75 (L) >90 mL/min    Comment: (NOTE) The eGFR has been calculated using the CKD EPI equation. This calculation has not been validated in all clinical situations. eGFR's persistently <90 mL/min signify possible Chronic Kidney Disease.    Anion gap 4 (L) 5 - 15  Troponin I     Status: None   Collection Time: 11/14/14  6:26 AM  Result Value Ref Range   Troponin I <0.03 <0.031 ng/mL    Comment:        NO INDICATION OF MYOCARDIAL INJURY.   Troponin I     Status: None   Collection Time: 11/14/14  9:10 AM  Result Value Ref Range   Troponin I <0.03 <0.031 ng/mL    Comment:        NO INDICATION OF MYOCARDIAL INJURY.   Troponin I     Status: None   Collection Time: 11/14/14 11:09 AM    Result Value Ref Range   Troponin I <0.03 <0.031 ng/mL    Comment:        NO INDICATION OF MYOCARDIAL INJURY.   CBC with Differential     Status: Abnormal   Collection Time: 12/21/14  2:35 AM  Result Value Ref Range   WBC 6.1 4.0 - 10.5 K/uL   RBC 4.58 4.22 - 5.81 MIL/uL   Hemoglobin 14.5 13.0 - 17.0 g/dL   HCT 43.3 39.0 - 52.0 %   MCV 94.5 78.0 - 100.0 fL   MCH 31.7 26.0 - 34.0 pg   MCHC 33.5 30.0 - 36.0 g/dL   RDW 13.5 11.5 - 15.5 %   Platelets 256 150 - 400 K/uL   Neutrophils Relative % 60 43 - 77 %   Neutro Abs 3.7 1.7 - 7.7 K/uL   Lymphocytes Relative 26 12 - 46 %   Lymphs Abs 1.6 0.7 - 4.0 K/uL   Monocytes Relative 8 3 - 12 %   Monocytes Absolute 0.5 0.1 - 1.0 K/uL   Eosinophils Relative 7 (H) 0 - 5 %   Eosinophils Absolute 0.4 0.0 - 0.7 K/uL   Basophils Relative 1 0 - 1 %   Basophils Absolute 0.0 0.0 - 0.1 K/uL  Basic metabolic panel     Status: Abnormal   Collection Time: 12/21/14  2:35 AM  Result Value Ref Range   Sodium 135 135 - 145 mmol/L   Potassium 3.9 3.5 - 5.1 mmol/L   Chloride 98 96 - 112 mmol/L   CO2 26 19 - 32 mmol/L   Glucose, Bld 121 (H) 70 - 99 mg/dL   BUN 15 6 - 23 mg/dL   Creatinine, Ser 1.19 0.50 - 1.35 mg/dL   Calcium 9.2 8.4 - 10.5 mg/dL   GFR calc non Af Amer 62 (L) >90 mL/min   GFR calc Af Amer 72 (L) >90 mL/min    Comment: (NOTE) The eGFR has been calculated using the CKD EPI equation. This calculation has not been validated in all clinical situations. eGFR's persistently <90 mL/min signify possible Chronic Kidney Disease.    Anion gap 11 5 - 15   Assessment/Plan: Post-polio syndrome We'll refer to physical therapy for evaluation and strengthening exercises.   COPD exacerbation Resolving. Exam within normal limits. Patient encouraged to continue his medications. Follow with pulmonary as scheduled. Continue cardiopulmonary rehabilitation. We'll attempt to initiate prior authorization for Xopenex.

## 2014-12-29 NOTE — Assessment & Plan Note (Signed)
We'll refer to physical therapy for evaluation and strengthening exercises.

## 2014-12-29 NOTE — Assessment & Plan Note (Signed)
Resolving. Exam within normal limits. Patient encouraged to continue his medications. Follow with pulmonary as scheduled. Continue cardiopulmonary rehabilitation. We'll attempt to initiate prior authorization for Xopenex.

## 2014-12-30 ENCOUNTER — Encounter: Payer: Self-pay | Admitting: Adult Health

## 2014-12-30 ENCOUNTER — Encounter (HOSPITAL_COMMUNITY): Payer: Medicare Other

## 2014-12-30 ENCOUNTER — Telehealth (HOSPITAL_COMMUNITY): Payer: Self-pay | Admitting: *Deleted

## 2014-12-30 ENCOUNTER — Telehealth: Payer: Self-pay | Admitting: Physician Assistant

## 2014-12-30 ENCOUNTER — Ambulatory Visit (INDEPENDENT_AMBULATORY_CARE_PROVIDER_SITE_OTHER): Payer: Medicare Other | Admitting: Adult Health

## 2014-12-30 VITALS — BP 128/62 | HR 60 | Temp 98.0°F | Ht 71.0 in | Wt 216.4 lb

## 2014-12-30 DIAGNOSIS — J449 Chronic obstructive pulmonary disease, unspecified: Secondary | ICD-10-CM | POA: Diagnosis not present

## 2014-12-30 NOTE — Assessment & Plan Note (Signed)
Frequent exacerbations - Recent flare now resolved   Plan  May use Allegra 180mg  daily As needed  Drainage.  Saline nasal rinses As needed   Mucinex DM Twice daily  As needed  Cough/congestion  Continue on BREO and Spiriva .  Follow up Dr. Joya Gaskins  In 3 months and As neede

## 2014-12-30 NOTE — Progress Notes (Signed)
   Subjective:    Patient ID: [name], male    DOB: [birth year], 64 y.o.   MRN: [id number]  HPI 63 yo former smoker with COPD   11/25/2014 ER follow up  Patient returns for an emergency room follow-up  Has frequent ER visits.  Seen 1 weeks ago in the emergency room for COPD exacerbation Chest x-ray without any acute process noted Patient was given a Xopenex nebulizer treatment with some improvement in symptoms He denies any fever or discolored mucus, chest pain, palpitations, syncope, orthopnea, PND, or increased leg swelling He is going to pulmonary rehabilitation Says he taking BREO and Spiriva on reg basis .  Not smoking.  Has nasal drainage that claritin is not working for.      Review of Systems  Constitutional:   No  weight loss, night sweats,  Fevers, chills,  +fatigue, or  lassitude.  HEENT:   No headaches,  Difficulty swallowing,  Tooth/dental problems, or  Sore throat,                No sneezing, itching, ear ache, +nasal congestion, post nasal drip,   CV:  No chest pain,  Orthopnea, PND, swelling in lower extremities, anasarca, dizziness, palpitations, syncope.   GI  No heartburn, indigestion, abdominal pain, nausea, vomiting, diarrhea, change in bowel habits, loss of appetite, bloody stools.   Resp: .  No chest wall deformity  Skin: no rash or lesions.  GU: no dysuria, change in color of urine, no urgency or frequency.  No flank pain, no hematuria   MS:  No joint pain or swelling.  No decreased range of motion.  No back pain.  Psych:  No change in mood or affect. No depression or anxiety.  No memory loss.          Objective:   Physical Exam GEN: A/Ox3; pleasant , NAD ,   HEENT:  [place]/AT,  EACs-clear, TMs-wnl, NOSE-clear, THROAT-clear, no lesions, no postnasal drip or exudate noted.   NECK:  Supple w/ fair ROM; no JVD; normal carotid impulses w/o bruits; no thyromegaly or nodules palpated; no lymphadenopathy.  RESP  Decreased BS , no wheezing  .no accessory muscle use, no dullness to percussion  CARD:  RRR, no m/r/g  , no peripheral edema, pulses intact, no cyanosis or clubbing.  GI:   Soft & nt; nml bowel sounds; no organomegaly or masses detected.  Musco: Warm bil, no deformities or joint swelling noted.   Neuro: alert, no focal deficits noted.    Skin: Warm, no lesions or rashes         Assessment & Plan:

## 2014-12-30 NOTE — Addendum Note (Signed)
Addended by: Parke Poisson E on: 12/30/2014 05:34 PM   Modules accepted: Medications

## 2014-12-30 NOTE — Telephone Encounter (Signed)
Caller name:Pereira, Ferry Relation to LM:[facility] Call back number:[phone] Pharmacy:  Reason for call: pt states he is needing a letter from [place] stating that he can start back attending the pulmonary rehab sessions at [facility].

## 2014-12-30 NOTE — Patient Instructions (Addendum)
May use Allegra 180mg  daily As needed  Drainage.  Saline nasal rinses As needed   Mucinex DM Twice daily  As needed  Cough/congestion  Continue on BREO and Spiriva .  Follow up Dr. Joya Gaskins  In 3 months and As needed

## 2014-12-31 ENCOUNTER — Encounter: Payer: Self-pay | Admitting: Physician Assistant

## 2014-12-31 NOTE — Telephone Encounter (Signed)
Letter written. Ready for pickup 

## 2014-12-31 NOTE — Telephone Encounter (Signed)
[facility] for pickup

## 2015-01-03 ENCOUNTER — Ambulatory Visit: Payer: Medicare Other | Attending: Physician Assistant | Admitting: Rehabilitation

## 2015-01-03 ENCOUNTER — Encounter: Payer: Self-pay | Admitting: Rehabilitation

## 2015-01-03 DIAGNOSIS — R269 Unspecified abnormalities of gait and mobility: Secondary | ICD-10-CM | POA: Diagnosis not present

## 2015-01-03 DIAGNOSIS — R262 Difficulty in walking, not elsewhere classified: Secondary | ICD-10-CM | POA: Insufficient documentation

## 2015-01-03 NOTE — Therapy (Signed)
[facility] [address]  [place], [place], [address] Phone: [phone]   Fax:  [phone]  Physical Therapy Evaluation  Patient Details  Name: [name] MRN: [id number] Date of Birth: [birth year] Referring Provider:  Brunetta Jeans, PA-C  Encounter Date: 01/03/2015      PT End of Session - 01/03/15 1548    Visit Number 1   Number of Visits 8   Date for PT Re-Evaluation 01/31/15   PT Start Time 7062   PT Stop Time 1540   PT Time Calculation (min) 51 min      Past Medical History  Diagnosis Date  . Hypertension   . Prostate cancer   . GERD (gastroesophageal reflux disease)   . History of TB (tuberculosis)     1984--  hospitalized for 4 month treatment  . Post-polio syndrome     polio at age 44--PT WAS IN IRON LUNG; PT WAS IN W/C UNTIL AGE 89; STILL HAS WEAKNESS RIGHT SIDE  . History of chronic bronchitis   . History of rheumatic fever   . COPD, frequent exacerbations     pulmologist-  dr Joya Gaskins--  Gold Stage C  . Bladder outlet obstruction   . Anxiety disorder   . History of urinary retention   . Nocturnal oxygen desaturation     USES O2 NIGHTLY  . Prostate cancer   . Complication of anesthesia     DIFFICULT WAKING   . Diabetes mellitus without complication     BODERLINE - DIET CONTROL  . Anxiety   . Shortness of breath dyspnea     RIGHT HEMIDIAPHRAGM ELEVATION - POST POLIO SYNDROME  . Dysrhythmia     PVC'S  . Arthritis     BILATERAL SHOULDERS, ELBOWS AND HANDS AND LEFT HIP AND KNEES--HX CORTISONE SHOTS IN SHOULDERS, ELBOWS, HIP AND KNEES    Past Surgical History  Procedure Laterality Date  . Other surgical history       Muscle & bone Graft/Polio  . Cystoscopy w/ cystogram/  transrectal ultrasound prostate bx  03-22-2009  . Cardiovascular stress test  06-08-2014  dr Mare Ferrari    normal lexiscan study/  no ischemia/  not gated due to PAC's  . Ureterosopy stone extraction  2000  .  Nasal septum surgery  2000  . Shoulder arthroscopy with open rotator cuff repair Bilateral 2013  &  1999    removal spurs and bursectomy  . Laparoscopic cholecystectomy  2013  . Prostate biopsy N/A 09/28/2014    Procedure: PROSTATE ULTRASOUND/BIOPSY;  Surgeon: [name], MD;  Location: [facility];  Service: Urology;  Laterality: N/A;  . Transurethral resection of prostate N/A 09/28/2014    Procedure: TRANSURETHRAL RESECTION OF THE PROSTATE (TURP);  Surgeon: [name], MD;  Location: [facility];  Service: Urology;  Laterality: N/A;    There were no vitals filed for this visit.  Visit Diagnosis:  Difficulty walking - Plan: PT plan of care cert/re-cert  Abnormality of gait - Plan: PT plan of care cert/re-cert      Subjective Assessment - 01/03/15 1442    Symptoms Pt presents with post polio syndrome.   "unsure why I am here"  reports he is currently in pulmonary rehab but has only attended one session.  will return tomorrow.  They are working on walking endurance, bike, upper extremity strength and endurance.  Reports the past 3 years lifting has become difficult.  Also having decreased balance the past  3 years, has fallen 3x in the past year. Fell 1x just walking, one time off of a scaffolding, and 1x off of a ladder. Reports OA in bilateral knees that got bad in the past 3 years; more trouble squatting and walking. He has a history of AFO use but has not had one in a few years. Would be interested in getting one set up again as he reports foot drop after becoming fatigued. never uses an AD.  works out in the yard witihout difficulty wearing light hiking shoes.    Pertinent History toes fused, R heel removed, limited by COPD, R ankle history of "22 surgeries for tendon transfer and fixation"   How long can you walk comfortably? 1.5-2 hours   Patient Stated Goals would like to strength the legs and knees, improve balance   Currently in Pain? Yes   Pain Score 8    Pain Location Shoulder   bilaterally; has RCRs both shoulders            [facility] PT Assessment - 01/03/15 0001    Assessment   Medical Diagnosis post-polio syndrome   Onset Date --  16 years ago   Next MD Visit not scheduled   Prior Therapy no   Precautions   Precaution Comments COPD; has rescue inhaler if needed   Restrictions   Weight Bearing Restrictions No   Balance Screen   Has the patient fallen in the past 6 months Yes   How many times? 2   Has the patient had a decrease in activity level because of a fear of falling?  No   Is the patient reluctant to leave their home because of a fear of falling?  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   Observation/Other Assessments   Focus on Therapeutic Outcomes (FOTO)  41% limted   ROM / Strength   AROM / PROM / Strength Strength   Strength   Strength Assessment Site Hip;Knee;Ankle;Shoulder;Elbow   Right/Left Shoulder Right;Left   Right Shoulder Flexion 4/5   Right Shoulder ABduction 4+/5   Right Shoulder Internal Rotation 4+/5   Right Shoulder External Rotation 4+/5   Left Shoulder Flexion 3+/5   Left Shoulder ABduction 4+/5   Left Shoulder Internal Rotation 4/5   Left Shoulder External Rotation 4/5   Right/Left Hip Left;Right   Right Hip Flexion 4-/5   Right Hip Extension 3+/5   Right Hip External Rotation  --  4/5   Right Hip Internal Rotation  --  4/5   Right Hip ABduction 4-/5   Left Hip Flexion 4/5   Left Hip Extension 3+/5   Left Hip External Rotation  --  4/5   Left Hip Internal Rotation  --  4/5   Left Hip ABduction 4/5   Right/Left Knee Right;Left   Right Knee Flexion 3+/5   Right Knee Extension 4-/5   Left Knee Flexion 4/5   Left  Knee Extension 4-/5   Right/Left Ankle Right;Left   Right Ankle Dorsiflexion 2+/5   Right Ankle Plantar Flexion 2+/5   Right Ankle Inversion 3-/5   Right Ankle Eversion 3-/5   Left Ankle Dorsiflexion 4+/5   Left Ankle Plantar Flexion 4/5  Left Ankle Inversion 4/5   Left Ankle Eversion 4/5   Ambulation/Gait   Gait Comments decreased R ankle DF with swing but no drop foot, hip drop with L Sl stance phase, wide BOS,    Standardized Balance Assessment   Standardized Balance Assessment Berg Balance Test   Berg Balance Test   Sit to Stand Able to stand without using hands and stabilize independently   Standing Unsupported Able to stand safely 2 minutes   Sitting with Back Unsupported but Feet Supported on Floor or Stool Able to sit safely and securely 2 minutes   Stand to Sit Sits safely with minimal use of hands   Transfers Able to transfer safely, minor use of hands   Standing Unsupported with Eyes Closed Able to stand 10 seconds safely   Standing Ubsupported with Feet Together Able to place feet together independently and stand 1 minute safely   From Standing, Reach Forward with Outstretched Arm Can reach confidently >25 cm (10")   From Standing Position, Pick up Object from Floor Able to pick up shoe safely and easily   From Standing Position, Turn to Look Behind Over each Shoulder Looks behind from both sides and weight shifts well   Turn 360 Degrees Able to turn 360 degrees safely in 4 seconds or less   Standing Unsupported, Alternately Place Feet on Step/Stool Able to stand independently and safely and complete 8 steps in 20 seconds   Standing Unsupported, One Foot in [employer] balance while stepping or standing   Standing on One Leg Tries to lift leg/unable to hold 3 seconds but remains standing independently   Total Score 49        Eval performed Gave pateint [employer] information to set up appointment for brace assessment                        PT Long  Term Goals - 01/03/15 1555    PT LONG TERM GOAL #1   Title indepedent with HEP 4/18   Time 4   Period Weeks   Status New   PT LONG TERM GOAL #2   Title improve tandem stance time to >10seconds bilaterally   Time 4   Period Weeks   Status New   PT LONG TERM GOAL #3   Title improve SL stance to >10seconds bilaterally   Time 4   Period Weeks   Status New   PT LONG TERM GOAL #4   Title report improved activity 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  pt is currently also participating in pulmonary rehab 2days per week but is also interested in PT for balance and knee strengthening.  Discussed obtaining a new brace for foot drop concerns and patient given contact information for [employer].    Pt will benefit from skilled therapeutic intervention in order to improve on the following deficits Abnormal gait;Decreased activity tolerance;Decreased balance;Difficulty walking;Decreased strength   Rehab Potential Good   Clinical Impairments Affecting Rehab Potential COPD, polio history   PT Frequency 2x / week   PT Duration 4 weeks  PT Treatment/Interventions Therapeutic exercise;Balance training;Neuromuscular re-education   PT Next Visit Plan decreased base of support/Single leg balance, LE strengthening hip and knee focus, needs HEP   Consulted and Agree with Plan of Care Patient          G-Codes - 01-25-2015 1558    Functional Assessment Tool Used FOTO   Functional Limitation Mobility: Walking and moving around   Mobility: Walking and Moving Around Current Status [phone]) At least 40 percent but less than 60 percent impaired, limited or restricted   Mobility: Walking and Moving Around  Goal Status [phone]) At least 20 percent but less than 40 percent impaired, limited or restricted       Problem List Patient Active Problem List   Diagnosis Date Noted  . Flank pain 11/07/2014  . Gross hematuria 10/19/2014  . Arthritis of both knees 10/17/2014  . Joint stiffness of multiple sites 10/17/2014  . Benign localized hyperplasia of prostate with urinary obstruction 09/29/2014  . COPD exacerbation 09/18/2014  . Memory loss 09/13/2014  . Pain in the chest   . Chest pain 08/18/2014  . Polyuria 08/15/2014  . Right knee pain 07/27/2014  . UTI (lower urinary tract infection) 07/14/2014  . Tuberculosis   . Left knee pain 06/30/2014  . Diverticulum of bladder 06/17/2014  . Chest pain on breathing 05/26/2014  . Unspecified constipation 04/29/2014  . Spider bite 04/29/2014  . Dehydration 04/13/2014  . Neuropathy 04/11/2014  . Concussion with no loss of consciousness 04/11/2014  . Chronic knee pain 02/22/2014  . Other malaise and fatigue 01/18/2014  . Post-polio syndrome 01/13/2014  . COPD (chronic obstructive pulmonary disease) Gold C Frequent exacerbations 01/13/2014  . Hypertension 01/13/2014  . GERD (gastroesophageal reflux disease) 01/13/2014  . Former smoker 01/13/2014  . Prostate cancer 01/13/2014  . History of tuberculosis 01/13/2014  . Bipolar disorder 01/13/2014  . Explosive personality disorder 01/13/2014  . Erectile dysfunction 01/13/2014  . Nephrolithiasis 01/13/2014  . Alcoholism 01/13/2014  . OSA (obstructive sleep apnea) 01/13/2014    Stark Bray, DPT, CMP 01/25/15, 4:01 PM  [facility] [address]  [place], [place], [address] Phone: [phone]   Fax:  [phone]

## 2015-01-04 ENCOUNTER — Encounter (HOSPITAL_COMMUNITY): Payer: Medicare Other

## 2015-01-06 ENCOUNTER — Telehealth: Payer: Self-pay | Admitting: Physician Assistant

## 2015-01-06 ENCOUNTER — Encounter (HOSPITAL_COMMUNITY): Payer: Medicare Other

## 2015-01-06 MED ORDER — ALBUTEROL SULFATE HFA 108 (90 BASE) MCG/ACT IN AERS: 2.0000 | INHALATION_SPRAY | Freq: Four times a day (QID) | RESPIRATORY_TRACT | Status: DC | PRN

## 2015-01-06 NOTE — Telephone Encounter (Signed)
Caller name: Kazuma, Elena Relation to pt: self  Call back number: [phone] Pharmacy: [id number] - [place], [place]. [phone] (Phone) [phone] (Fax)        Reason for call:  As per insurance will not cover levalbuterol (XOPENEX HFA) 45 MCG/ACT inhaler. Requesting pro-air inhaler.

## 2015-01-06 NOTE — Telephone Encounter (Signed)
Medication sent.

## 2015-01-09 ENCOUNTER — Emergency Department (HOSPITAL_BASED_OUTPATIENT_CLINIC_OR_DEPARTMENT_OTHER): Payer: Medicare Other

## 2015-01-09 ENCOUNTER — Encounter (HOSPITAL_BASED_OUTPATIENT_CLINIC_OR_DEPARTMENT_OTHER): Payer: Self-pay

## 2015-01-09 ENCOUNTER — Emergency Department (HOSPITAL_BASED_OUTPATIENT_CLINIC_OR_DEPARTMENT_OTHER)
Admission: EM | Admit: 2015-01-09 | Discharge: 2015-01-09 | Disposition: A | Discharge status: Home / Self Care | Payer: Medicare Other | Attending: Emergency Medicine | Admitting: Emergency Medicine

## 2015-01-09 DIAGNOSIS — Z9981 Dependence on supplemental oxygen: Secondary | ICD-10-CM | POA: Diagnosis not present

## 2015-01-09 DIAGNOSIS — Z87448 Personal history of other diseases of urinary system: Secondary | ICD-10-CM | POA: Insufficient documentation

## 2015-01-09 DIAGNOSIS — Z8546 Personal history of malignant neoplasm of prostate: Secondary | ICD-10-CM | POA: Insufficient documentation

## 2015-01-09 DIAGNOSIS — Z880 Allergy status to penicillin: Secondary | ICD-10-CM | POA: Diagnosis not present

## 2015-01-09 DIAGNOSIS — Z8639 Personal history of other endocrine, nutritional and metabolic disease: Secondary | ICD-10-CM | POA: Insufficient documentation

## 2015-01-09 DIAGNOSIS — Z8611 Personal history of tuberculosis: Secondary | ICD-10-CM | POA: Diagnosis not present

## 2015-01-09 DIAGNOSIS — Z79899 Other long term (current) drug therapy: Secondary | ICD-10-CM | POA: Diagnosis not present

## 2015-01-09 DIAGNOSIS — R05 Cough: Secondary | ICD-10-CM | POA: Diagnosis present

## 2015-01-09 DIAGNOSIS — J441 Chronic obstructive pulmonary disease with (acute) exacerbation: Secondary | ICD-10-CM | POA: Insufficient documentation

## 2015-01-09 DIAGNOSIS — F419 Anxiety disorder, unspecified: Secondary | ICD-10-CM | POA: Insufficient documentation

## 2015-01-09 DIAGNOSIS — I10 Essential (primary) hypertension: Secondary | ICD-10-CM | POA: Diagnosis not present

## 2015-01-09 DIAGNOSIS — K219 Gastro-esophageal reflux disease without esophagitis: Secondary | ICD-10-CM | POA: Diagnosis not present

## 2015-01-09 DIAGNOSIS — Z7951 Long term (current) use of inhaled steroids: Secondary | ICD-10-CM | POA: Diagnosis not present

## 2015-01-09 DIAGNOSIS — Z87891 Personal history of nicotine dependence: Secondary | ICD-10-CM | POA: Insufficient documentation

## 2015-01-09 DIAGNOSIS — M159 Polyosteoarthritis, unspecified: Secondary | ICD-10-CM | POA: Diagnosis not present

## 2015-01-09 DIAGNOSIS — Z8612 Personal history of poliomyelitis: Secondary | ICD-10-CM | POA: Diagnosis not present

## 2015-01-09 DIAGNOSIS — Z7952 Long term (current) use of systemic steroids: Secondary | ICD-10-CM | POA: Insufficient documentation

## 2015-01-09 DIAGNOSIS — R062 Wheezing: Secondary | ICD-10-CM | POA: Diagnosis not present

## 2015-01-09 LAB — CBC
HCT: 42.5 % (ref 39.0–52.0)
HEMOGLOBIN: 14.2 g/dL (ref 13.0–17.0)
MCH: 31.5 pg (ref 26.0–34.0)
MCHC: 33.4 g/dL (ref 30.0–36.0)
MCV: 94.2 fL (ref 78.0–100.0)
PLATELETS: 199 10*3/uL (ref 150–400)
RBC: 4.51 MIL/uL (ref 4.22–5.81)
RDW: 13.6 % (ref 11.5–15.5)
WBC: 7.5 10*3/uL (ref 4.0–10.5)

## 2015-01-09 LAB — BASIC METABOLIC PANEL
Anion gap: 8 (ref 5–15)
BUN: 10 mg/dL (ref 6–23)
CO2: 28 mmol/L (ref 19–32)
CREATININE: 1.16 mg/dL (ref 0.50–1.35)
Calcium: 8.8 mg/dL (ref 8.4–10.5)
Chloride: 102 mmol/L (ref 96–112)
GFR calc Af Amer: 75 mL/min — ABNORMAL LOW (ref >90)
GFR calc non Af Amer: 64 mL/min — ABNORMAL LOW (ref >90)
Glucose, Bld: 134 mg/dL — ABNORMAL HIGH (ref 70–99)
Potassium: 4.1 mmol/L (ref 3.5–5.1)
SODIUM: 138 mmol/L (ref 135–145)

## 2015-01-09 LAB — TROPONIN I: Troponin I: <0.03 ng/mL (ref <0.031)

## 2015-01-09 MED ORDER — ALBUTEROL (5 MG/ML) CONTINUOUS INHALATION SOLN
10.0000 mg/h | INHALATION_SOLUTION | RESPIRATORY_TRACT | Status: AC
  Administered 2015-01-09: 10 mg/h via RESPIRATORY_TRACT
  Filled 2015-01-09: qty 20

## 2015-01-09 MED ORDER — PREDNISONE 50 MG PO TABS
60.0000 mg | ORAL_TABLET | Freq: Once | ORAL | Status: AC
  Administered 2015-01-09: 60 mg via ORAL
  Filled 2015-01-09 (×2): qty 1

## 2015-01-09 MED ORDER — IPRATROPIUM BROMIDE 0.02 % IN SOLN
0.5000 mg | Freq: Once | RESPIRATORY_TRACT | Status: AC
  Administered 2015-01-09: 0.5 mg via RESPIRATORY_TRACT
  Filled 2015-01-09: qty 2.5

## 2015-01-09 MED ORDER — LEVALBUTEROL HCL 0.63 MG/3ML IN NEBU
INHALATION_SOLUTION | RESPIRATORY_TRACT | Status: AC
  Filled 2015-01-09: qty 3

## 2015-01-09 MED ORDER — ALBUTEROL SULFATE (2.5 MG/3ML) 0.083% IN NEBU: 5.0000 mg | INHALATION_SOLUTION | Freq: Once | RESPIRATORY_TRACT | Status: DC

## 2015-01-09 MED ORDER — LEVALBUTEROL HCL 1.25 MG/0.5ML IN NEBU: 1.2500 mg | INHALATION_SOLUTION | Freq: Once | RESPIRATORY_TRACT | Status: DC

## 2015-01-09 MED ORDER — PREDNISONE 50 MG PO TABS
50.0000 mg | ORAL_TABLET | Freq: Every day | ORAL | Status: DC
Start: 2015-01-09 — End: 2015-01-18

## 2015-01-09 MED ORDER — LEVALBUTEROL HCL 0.63 MG/3ML IN NEBU
0.6300 mg | INHALATION_SOLUTION | Freq: Once | RESPIRATORY_TRACT | Status: AC
  Administered 2015-01-09: 0.63 mg via RESPIRATORY_TRACT

## 2015-01-09 NOTE — ED Notes (Signed)
MD at bedside. 

## 2015-01-09 NOTE — ED Notes (Signed)
RT at bedside admin breathing tx

## 2015-01-09 NOTE — ED Notes (Signed)
Pt reports chest pain x 30 mins- ekg done and given to EDP Linker to review- orders received to draw troponin

## 2015-01-09 NOTE — ED Provider Notes (Signed)
CSN: [id number]     Arrival date & time 01/09/15  1000 History   First MD Initiated Contact with Patient 01/09/15 1019     Chief Complaint  Patient presents with  . Cough     (Consider location/radiation/quality/duration/timing/severity/associated sxs/prior Treatment) HPI  Pt presenting with c/o shortness of breath and wheezing, hx of COPD and frequent exacerbation.  He states that over the past 4 days he has been having URI symptoms, then 2 days ago he developed cough and wheezing.  Cough is nonproductive.  No fever/chills.  He has taken leftover 10mg  tabs of prednisone last night and today.  He has also been using his albuterol inhaler and albuterol nebs at home without much relief.  No leg swelling.  No chest pain.  Has been trying OTC cold meds without much relief as well.  There are no other associated systemic symptoms, there are no other alleviating or modifying factors.    Past Medical History  Diagnosis Date  . Hypertension   . Prostate cancer   . GERD (gastroesophageal reflux disease)   . History of TB (tuberculosis)     1984--  hospitalized for 4 month treatment  . Post-polio syndrome     polio at age 89--PT WAS IN IRON LUNG; PT WAS IN W/C UNTIL AGE 25; STILL HAS WEAKNESS RIGHT SIDE  . History of chronic bronchitis   . History of rheumatic fever   . COPD, frequent exacerbations     pulmologist-  dr Joya Gaskins--  Gold Stage C  . Bladder outlet obstruction   . Anxiety disorder   . History of urinary retention   . Nocturnal oxygen desaturation     USES O2 NIGHTLY  . Prostate cancer   . Complication of anesthesia     DIFFICULT WAKING   . Diabetes mellitus without complication     BODERLINE - DIET CONTROL  . Anxiety   . Shortness of breath dyspnea     RIGHT HEMIDIAPHRAGM ELEVATION - POST POLIO SYNDROME  . Dysrhythmia     PVC'S  . Arthritis     BILATERAL SHOULDERS, ELBOWS AND HANDS AND LEFT HIP AND KNEES--HX CORTISONE SHOTS IN SHOULDERS, ELBOWS, HIP AND KNEES   Past  Surgical History  Procedure Laterality Date  . Other surgical history       Muscle & bone Graft/Polio  . Cystoscopy w/ cystogram/  transrectal ultrasound prostate bx  03-22-2009  . Cardiovascular stress test  06-08-2014  dr Mare Ferrari    normal lexiscan study/  no ischemia/  not gated due to PAC's  . Ureterosopy stone extraction  2000  . Nasal septum surgery  2000  . Shoulder arthroscopy with open rotator cuff repair Bilateral 2013  &  1999    removal spurs and bursectomy  . Laparoscopic cholecystectomy  2013  . Prostate biopsy N/A 09/28/2014    Procedure: PROSTATE ULTRASOUND/BIOPSY;  Surgeon: [name], MD;  Location: [facility];  Service: Urology;  Laterality: N/A;  . Transurethral resection of prostate N/A 09/28/2014    Procedure: TRANSURETHRAL RESECTION OF THE PROSTATE (TURP);  Surgeon: [name], MD;  Location: [facility];  Service: Urology;  Laterality: N/A;   Family History  Problem Relation Age of Onset  . Alzheimer's disease Father 4    Deceased  . Stomach cancer Father   . Heart attack Father   . Heart disease Father   . Skin cancer Mother     Facial-Living  . Alcohol abuse Sister     x2  .  Mental illness Sister     x2  . Diabetes Maternal Aunt     x2  . Thyroid disease Maternal Aunt     x4  . Diabetes Maternal Uncle   . Tuberculosis Paternal Grandfather   . Tuberculosis Paternal Grandmother   . Alzheimer's disease Paternal Aunt   . Alzheimer's disease Paternal Uncle    History  Substance Use Topics  . Smoking status: Former Smoker -- 48 years    Quit date: 08/25/2013  . Smokeless tobacco: Never Used     Comment: hx  smoke a pack of pipe tobacco(1.5oz) every 10 days.   . Alcohol Use: 12.6 oz/week    21 Cans of beer per week     Comment: average 3 beers per day    Review of Systems  ROS reviewed and all otherwise negative except for mentioned in HPI    Allergies  Ivp dye; Levaquin; Penicillins; Aspirin; Morphine and related; and Nsaids  Home  Medications   Prior to Admission medications   Medication Sig Start Date End Date Taking? Authorizing Provider  albuterol (PROVENTIL HFA;VENTOLIN HFA) 108 (90 BASE) MCG/ACT inhaler Inhale 2 puffs into the lungs every 6 (six) hours as needed for wheezing or shortness of breath. 01/06/15   Brunetta Jeans, PA-C  ALPRAZolam [name]) 1 MG tablet Take 1 tablet (1 mg total) by mouth 3 (three) times daily as needed for anxiety. Patient taking differently: Take 0.5 mg by mouth 3 (three) times daily as needed for anxiety.  06/03/14   Brunetta Jeans, PA-C  amLODipine (NORVASC) 10 MG tablet TAKE 1 TABLET (10 MG TOTAL) BY MOUTH DAILY. 10/29/14   Brunetta Jeans, PA-C  baclofen (LIORESAL) 10 MG tablet TAKE 1 TABLET (10 MG TOTAL) BY MOUTH 2 (TWO) TIMES DAILY. 10/29/14   Brunetta Jeans, PA-C  BREO ELLIPTA 100-25 MCG/INH AEPB 1 puff. daily 09/06/14   Historical Provider, MD  fluticasone (FLONASE) 50 MCG/ACT nasal spray Place 2 sprays into both nostrils daily. 11/22/14   Brunetta Jeans, PA-C  HYDROcodone-acetaminophen (NORCO) 5-325 MG per tablet Take 1-2 tablets by mouth every 6 (six) hours as needed. 12/03/14   Veryl Speak, MD  levalbuterol [facility] HFA) 45 MCG/ACT inhaler Inhale 2 puffs into the lungs every 6 (six) hours as needed for wheezing. Patient not taking: Reported on 01/03/2015 11/22/14   Brunetta Jeans, PA-C  Linaclotide [facility]) 145 MCG CAPS capsule Take 1 capsule (145 mcg total) by mouth daily. Patient not taking: Reported on 01/03/2015 11/22/14   Brunetta Jeans, PA-C  loratadine (CLARITIN) 10 MG tablet Take 10 mg by mouth daily as needed for allergies.    Historical Provider, MD  metoprolol succinate (TOPROL-XL) 25 MG 24 hr tablet Take 1 tablet (25 mg total) by mouth daily. 09/01/14   Brunetta Jeans, PA-C  montelukast (SINGULAIR) 10 MG tablet Take 1 tablet (10 mg total) by mouth at bedtime. 10/27/14   Elsie Stain, MD  omeprazole (PRILOSEC) 20 MG capsule Take 1 capsule (20 mg total) by mouth  daily. 07/08/14   Elsie Stain, MD  oxyCODONE-acetaminophen (PERCOCET/ROXICET) 5-325 MG per tablet Take 1-2 tablets by mouth every 6 (six) hours as needed for severe pain. 10/31/14   Alfonzo Beers, MD  predniSONE (DELTASONE) 50 MG tablet Take 1 tablet (50 mg total) by mouth daily. 01/09/15   Alfonzo Beers, MD  promethazine (PHENERGAN) 12.5 MG tablet Take 1 tablet (12.5 mg total) by mouth every 8 (eight) hours as needed for nausea or  vomiting. 07/13/14   Brunetta Jeans, PA-C  tiotropium (SPIRIVA) 18 MCG inhalation capsule Place 18 mcg into inhaler and inhale daily. IN AM    Historical Provider, MD  zolpidem (AMBIEN) 5 MG tablet Take 1 tablet (5 mg total) by mouth at bedtime as needed for sleep. 10/06/14   Brunetta Jeans, PA-C   BP 127/60 mmHg  Pulse 90  Temp(Src) 98.1 F (36.7 C) (Oral)  Resp 18  Ht 5\' 11"  (1.803 m)  Wt 240 lb (108.863 kg)  BMI 33.49 kg/m2  SpO2 95%  Vitals reviewed Physical Exam  Physical Examination: General appearance - alert, well appearing, and in no distress Mental status - alert, oriented to person, place, and time Eyes - no conjunctival injection, no scleral icterus Mouth - mucous membranes moist, pharynx normal without lesions Chest - bilateral coarse expiratory wheezing with some decreased air movement, BSS Heart - normal rate, regular rhythm, normal S1, S2, no murmurs, rubs, clicks or gallops Abdomen - soft, nontender, nondistended, no masses or organomegaly Extremities - peripheral pulses normal, no pedal edema, no clubbing or cyanosis Skin - normal coloration and turgor, no rashes  ED Course  Procedures (including critical care time)  2:01 PM pt feels much improved after nebs.  He has ambulated around the department with O2 sats maintained at 96%.  I have offered him admission, but he declines and would prefer to be discharged home.   Labs Review Labs Reviewed  BASIC METABOLIC PANEL - Abnormal; Notable for the following:    Glucose, Bld 134 (*)     GFR calc non Af Amer 64 (*)    GFR calc Af Amer 75 (*)    All other components within normal limits  CBC  TROPONIN I    Imaging Review Dg Chest 2 View  01/09/2015   CLINICAL DATA:  Four day history of cough and congestion. History of prostate carcinoma  EXAM: CHEST  2 VIEW  COMPARISON:  December 21, 2014  FINDINGS: There is no edema or consolidation. Heart size and pulmonary vascularity are normal. No adenopathy. No bone lesions.  IMPRESSION: No edema or consolidation.   Electronically Signed   By: Lowella Grip III M.D.   On: 01/09/2015 10:56     EKG Interpretation   Date/Time:  Sunday January 09 2015 12:17:16 EDT Ventricular Rate:  77 PR Interval:  156 QRS Duration: 94 QT Interval:  394 QTC Calculation: 445 R Axis:   51 Text Interpretation:  Sinus rhythm with Premature atrial complexes  Incomplete right bundle branch block Cannot rule out Anterior infarct ,  age undetermined Abnormal ECG Since previous tracing pacs are new  Confirmed by [facility]  MD, Denning [phone]) on 01/09/2015 1:11:50 PM      MDM   Final diagnoses:  COPD exacerbation    Pt presenting with c/o wheezing and cough c/w prior COPD exacerbations.  Pt was treated with albuterol/atrovent, then received 1 hour long continuous neb.  He had an episode of chest pain during treatment which resolved spontaneously- ekg and troponin were reassuring.  Doubt ACS, PE or other cause of chest pain.  Pt feels much improved after albuterol.  He was also given dose of prednisone- he had taken 10mg  yesterday and today- but these are subtherapeutic doses, so d/w patient that he would be placed on a 5 day course.  Pt offered admission but he prefers to be discharged.  He did maintain O2 sats with ambulation.  Discharged with strict return precautions.  Pt agreeable  with plan.   Xray images reviewed and interpreted by me as well.  Nursing notes including past medical history and social history reviewed and considered in documentation Prior  records reviewed and considered during this visit     Alfonzo Beers, MD 01/09/15 1534

## 2015-01-09 NOTE — Discharge Instructions (Signed)
Return to the ED with any concerns including difficulty breathing despite using albuterol2 puffs  every 4 hours, not drinking fluids, decreased urine output, vomiting and not able to keep down liquids or medications, decreased level of alertness/lethargy, or any other alarming symptoms °

## 2015-01-09 NOTE — ED Notes (Addendum)
Patient here with cough and congestion x 4 days. Reports that the cough is dry but concerned that he is feeling worse and thinks related to COPD. otc meds cold and flu with no relief. Speaking full sentences.patient started prednisone 10mg  twice a day since Friday. Coughing and wheezing on assessment.

## 2015-01-09 NOTE — ED Notes (Signed)
Ambulated in hall on room air,  HR 88-98, SpO2 96-98%, RR 20-24, complaint of some light headedness while ambulating.  HR after 2 minutes at rest 80, SpO2 98% on room air.

## 2015-01-10 ENCOUNTER — Ambulatory Visit: Payer: Self-pay | Admitting: Critical Care Medicine

## 2015-01-10 ENCOUNTER — Ambulatory Visit: Payer: Medicare Other | Admitting: Rehabilitation

## 2015-01-11 ENCOUNTER — Telehealth (HOSPITAL_COMMUNITY): Payer: Self-pay | Admitting: *Deleted

## 2015-01-11 ENCOUNTER — Encounter (HOSPITAL_COMMUNITY): Payer: Medicare Other

## 2015-01-11 ENCOUNTER — Encounter: Payer: Self-pay | Admitting: Physician Assistant

## 2015-01-11 ENCOUNTER — Ambulatory Visit (INDEPENDENT_AMBULATORY_CARE_PROVIDER_SITE_OTHER): Payer: Medicare Other | Admitting: Physician Assistant

## 2015-01-11 VITALS — BP 106/52 | HR 69 | Temp 97.8°F | Resp 16 | Ht 73.0 in | Wt 217.4 lb

## 2015-01-11 DIAGNOSIS — F419 Anxiety disorder, unspecified: Secondary | ICD-10-CM | POA: Diagnosis not present

## 2015-01-11 DIAGNOSIS — K2100 Gastro-esophageal reflux disease with esophagitis, without bleeding: Secondary | ICD-10-CM

## 2015-01-11 DIAGNOSIS — J00 Acute nasopharyngitis [common cold]: Secondary | ICD-10-CM | POA: Diagnosis not present

## 2015-01-11 DIAGNOSIS — J208 Acute bronchitis due to other specified organisms: Secondary | ICD-10-CM

## 2015-01-11 DIAGNOSIS — B9689 Other specified bacterial agents as the cause of diseases classified elsewhere: Secondary | ICD-10-CM

## 2015-01-11 DIAGNOSIS — J441 Chronic obstructive pulmonary disease with (acute) exacerbation: Secondary | ICD-10-CM | POA: Diagnosis not present

## 2015-01-11 MED ORDER — DOXYCYCLINE HYCLATE 100 MG PO CAPS: 100.0000 mg | ORAL_CAPSULE | Freq: Two times a day (BID) | ORAL | Status: DC

## 2015-01-11 MED ORDER — OMEPRAZOLE 20 MG PO CPDR: 20.0000 mg | DELAYED_RELEASE_CAPSULE | Freq: Two times a day (BID) | ORAL | Status: DC

## 2015-01-11 MED ORDER — ALPRAZOLAM 1 MG PO TABS: 0.5000 mg | ORAL_TABLET | Freq: Three times a day (TID) | ORAL | Status: DC | PRN

## 2015-01-11 NOTE — Patient Instructions (Signed)
Please take the Doxycycline as directed.  Stay well hydrated.  Continue other medications as directed.  You do not have to taper the Prednisone.  I will work on getting a new home [place]. I will also speak with the RNs at Pulmonary Rehabilitation.

## 2015-01-11 NOTE — Progress Notes (Addendum)
Patient presents to clinic today for ER follow-up of COPD exacerbation. Was started on prednisone burst x 5 days. Endorses taking medication as directed with improvement of symptoms.  Denies wheezing or chest tightness.  Does endorse productive cough that is worsened. Denies fever.  Patient currently on 2L O2 via nasal cannula at night.  O2 currently provided by [employer] but patient having difficulty with that company. Is requesting we transfer care to another agency.  Patient has had prior noctural Pulse-ox studies with desats in low-mid 80s, showing need for nocturnal O2.  Does not need portable oxygen or daytime therapy of any kind.  Past Medical History  Diagnosis Date  . Hypertension   . Prostate cancer   . GERD (gastroesophageal reflux disease)   . History of TB (tuberculosis)     1984--  hospitalized for 4 month treatment  . Post-polio syndrome     polio at age 63--PT WAS IN IRON LUNG; PT WAS IN W/C UNTIL AGE 10; STILL HAS WEAKNESS RIGHT SIDE  . History of chronic bronchitis   . History of rheumatic fever   . COPD, frequent exacerbations     pulmologist-  dr Joya Gaskins--  Gold Stage C  . Bladder outlet obstruction   . Anxiety disorder   . History of urinary retention   . Nocturnal oxygen desaturation     USES O2 NIGHTLY  . Prostate cancer   . Complication of anesthesia     DIFFICULT WAKING   . Diabetes mellitus without complication     BODERLINE - DIET CONTROL  . Anxiety   . Shortness of breath dyspnea     RIGHT HEMIDIAPHRAGM ELEVATION - POST POLIO SYNDROME  . Dysrhythmia     PVC'S  . Arthritis     BILATERAL SHOULDERS, ELBOWS AND HANDS AND LEFT HIP AND KNEES--HX CORTISONE SHOTS IN SHOULDERS, ELBOWS, HIP AND KNEES    Current Outpatient Prescriptions on File Prior to Visit  Medication Sig Dispense Refill  . amLODipine (NORVASC) 10 MG tablet TAKE 1 TABLET (10 MG TOTAL) BY MOUTH DAILY. 90 tablet 0  . baclofen (LIORESAL) 10 MG tablet TAKE 1 TABLET (10 MG TOTAL) BY MOUTH 2 (TWO)  TIMES DAILY. 60 tablet 2  . BREO ELLIPTA 100-25 MCG/INH AEPB 1 puff. daily  1  . fluticasone (FLONASE) 50 MCG/ACT nasal spray Place 2 sprays into both nostrils daily. 16 g 12  . levalbuterol (XOPENEX HFA) 45 MCG/ACT inhaler Inhale 2 puffs into the lungs every 6 (six) hours as needed for wheezing. 1 Inhaler 12  . Linaclotide (LINZESS) 145 MCG CAPS capsule Take 1 capsule (145 mcg total) by mouth daily. 30 capsule 1  . loratadine (CLARITIN) 10 MG tablet Take 10 mg by mouth daily as needed for allergies.    . metoprolol succinate (TOPROL-XL) 25 MG 24 hr tablet Take 1 tablet (25 mg total) by mouth daily. 30 tablet 3  . montelukast (SINGULAIR) 10 MG tablet Take 1 tablet (10 mg total) by mouth at bedtime. 30 tablet 5  . oxyCODONE-acetaminophen (PERCOCET/ROXICET) 5-325 MG per tablet Take 1-2 tablets by mouth every 6 (six) hours as needed for severe pain. 15 tablet 0  . promethazine (PHENERGAN) 12.5 MG tablet Take 1 tablet (12.5 mg total) by mouth every 8 (eight) hours as needed for nausea or vomiting. 30 tablet 0  . tiotropium (SPIRIVA) 18 MCG inhalation capsule Place 18 mcg into inhaler and inhale daily. IN AM    . zolpidem (AMBIEN) 5 MG tablet Take 1 tablet (5  mg total) by mouth at bedtime as needed for sleep. 15 tablet 1   No current facility-administered medications on file prior to visit.    Allergies  Allergen Reactions  . Ivp Dye [Iodinated Diagnostic Agents] Anaphylaxis  . Levaquin [Levofloxacin In D5w] Shortness Of Breath    In addition: sweating, chest pain, and diarrhea.   . Penicillins Anaphylaxis    Heart stops  . Aspirin Other (See Comments)    Reaction unknown  . Morphine And Related Nausea And Vomiting  . Nsaids Other (See Comments)    Difficulty breathing    Family History  Problem Relation Age of Onset  . Alzheimer's disease Father 4    Deceased  . Stomach cancer Father   . Heart attack Father   . Heart disease Father   . Skin cancer Mother     Facial-Living  .  Alcohol abuse Sister     x2  . Mental illness Sister     x2  . Diabetes Maternal Aunt     x2  . Thyroid disease Maternal Aunt     x4  . Diabetes Maternal Uncle   . Tuberculosis Paternal Grandfather   . Tuberculosis Paternal Grandmother   . Alzheimer's disease Paternal Aunt   . Alzheimer's disease Paternal Uncle     History   Social History  . Marital Status: Married    Spouse Name: N/A  . Number of Children: N/A  . Years of Education: N/A   Social History Main Topics  . Smoking status: Former Smoker -- 51 years    Quit date: 08/25/2013  . Smokeless tobacco: Never Used     Comment: hx  smoke a pack of pipe tobacco(1.5oz) every 10 days.   . Alcohol Use: 12.6 oz/week    21 Cans of beer per week     Comment: average 3 beers per day  . Drug Use: No  . Sexual Activity: Not on file   Other Topics Concern  . None   Social History Narrative    Review of Systems - See HPI.  All other ROS are negative.  BP 106/52 mmHg  Pulse 69  Temp(Src) 97.8 F (36.6 C) (Oral)  Resp 16  Ht '6\' 1"'  (1.854 m)  Wt 217 lb 6 oz (98.601 kg)  BMI 28.69 kg/m2  SpO2 98%  Physical Exam  Constitutional: He is oriented to person, place, and time and well-developed, well-nourished, and in no distress.  HENT:  Head: Normocephalic and atraumatic.  Right Ear: External ear normal.  Left Ear: External ear normal.  Nose: Nose normal.  Mouth/Throat: Oropharynx is clear and moist. No oropharyngeal exudate.  Eyes: Conjunctivae are normal.  Neck: Neck supple.  Cardiovascular: Normal rate, regular rhythm, normal heart sounds and intact distal pulses.   Pulmonary/Chest: Effort normal and breath sounds normal. No respiratory distress. He has no wheezes. He has no rales. He exhibits no tenderness.  Neurological: He is alert and oriented to person, place, and time.  Skin: Skin is warm and dry. No rash noted.  Psychiatric: Affect normal.  Vitals reviewed.   Recent Results (from the past 2160 hour(s))    Urinalysis, Routine w reflex microscopic     Status: Abnormal   Collection Time: 10/31/14  5:24 PM  Result Value Ref Range   Color, Urine YELLOW YELLOW   APPearance CLOUDY (A) CLEAR   Specific Gravity, Urine 1.021 1.005 - 1.030   pH 5.5 5.0 - 8.0   Glucose, UA NEGATIVE NEGATIVE mg/dL  Hgb urine dipstick MODERATE (A) NEGATIVE   Bilirubin Urine NEGATIVE NEGATIVE   Ketones, ur 15 (A) NEGATIVE mg/dL   Protein, ur 30 (A) NEGATIVE mg/dL   Urobilinogen, UA 0.2 0.0 - 1.0 mg/dL   Nitrite NEGATIVE NEGATIVE   Leukocytes, UA LARGE (A) NEGATIVE  Urine microscopic-add on     Status: Abnormal   Collection Time: 10/31/14  5:24 PM  Result Value Ref Range   Squamous Epithelial / LPF RARE RARE   WBC, UA TOO NUMEROUS TO COUNT <3 WBC/hpf   RBC / HPF 11-20 <3 RBC/hpf   Bacteria, UA MANY (A) RARE   Urine-Other MUCOUS PRESENT   Urine culture     Status: None   Collection Time: 10/31/14  5:24 PM  Result Value Ref Range   Specimen Description URINE, RANDOM    Special Requests NONE    Colony Count NO GROWTH Performed at [facility]     Culture NO GROWTH Performed at [facility]     Report Status 11/01/2014 FINAL   CBC     Status: None   Collection Time: 10/31/14  5:40 PM  Result Value Ref Range   WBC 6.9 4.0 - 10.5 K/uL   RBC 4.46 4.22 - 5.81 MIL/uL   Hemoglobin 14.1 13.0 - 17.0 g/dL   HCT 41.8 39.0 - 52.0 %   MCV 93.7 78.0 - 100.0 fL   MCH 31.6 26.0 - 34.0 pg   MCHC 33.7 30.0 - 36.0 g/dL   RDW 12.9 11.5 - 15.5 %   Platelets 265 150 - 400 K/uL  Comprehensive metabolic panel     Status: Abnormal   Collection Time: 10/31/14  5:40 PM  Result Value Ref Range   Sodium 136 135 - 145 mmol/L    Comment: Please note change in reference range.   Potassium 4.0 3.5 - 5.1 mmol/L    Comment: Please note change in reference range.   Chloride 105 96 - 112 mEq/L   CO2 26 19 - 32 mmol/L   Glucose, Bld 105 (H) 70 - 99 mg/dL   BUN 18 6 - 23 mg/dL   Creatinine, Ser 1.54 (H) 0.50 - 1.35  mg/dL   Calcium 9.0 8.4 - 10.5 mg/dL   Total Protein 7.6 6.0 - 8.3 g/dL   Albumin 4.4 3.5 - 5.2 g/dL   AST 25 0 - 37 U/L   ALT 29 0 - 53 U/L   Alkaline Phosphatase 67 39 - 117 U/L   Total Bilirubin 0.8 0.3 - 1.2 mg/dL   GFR calc non Af Amer 46 (L) >90 mL/min   GFR calc Af Amer 53 (L) >90 mL/min    Comment: (NOTE) The eGFR has been calculated using the CKD EPI equation. This calculation has not been validated in all clinical situations. eGFR's persistently <90 mL/min signify possible Chronic Kidney Disease.    Anion gap 5 5 - 15  Lipase, blood     Status: None   Collection Time: 10/31/14  5:40 PM  Result Value Ref Range   Lipase 37 11 - 59 U/L  POCT urinalysis dipstick     Status: Abnormal   Collection Time: 11/03/14 11:36 AM  Result Value Ref Range   Color, UA gold    Clarity, UA cloudy    Glucose, UA neg    Bilirubin, UA neg    Ketones, UA neg    Spec Grav, UA >=1.030    Blood, UA Moderate (2+)    pH, UA 5.0  Protein, UA positive    Urobilinogen, UA 1.0    Nitrite, UA neg    Leukocytes, UA small (1+)   CBC     Status: None   Collection Time: 11/03/14 11:58 AM  Result Value Ref Range   WBC 6.1 4.0 - 10.5 K/uL   RBC 4.36 4.22 - 5.81 Mil/uL   Platelets 254.0 150.0 - 400.0 K/uL   Hemoglobin 13.7 13.0 - 17.0 g/dL   HCT 40.5 39.0 - 52.0 %   MCV 93.0 78.0 - 100.0 fl   MCHC 33.9 30.0 - 36.0 g/dL   RDW 13.8 11.5 - 15.5 %  Comp Met (CMET)     Status: Abnormal   Collection Time: 11/03/14 11:58 AM  Result Value Ref Range   Sodium 138 135 - 145 mEq/L   Potassium 4.3 3.5 - 5.1 mEq/L   Chloride 107 96 - 112 mEq/L   CO2 29 19 - 32 mEq/L   Glucose, Bld 103 (H) 70 - 99 mg/dL   BUN 19 6 - 23 mg/dL   Creatinine, Ser 1.12 0.40 - 1.50 mg/dL   Total Bilirubin 0.3 0.2 - 1.2 mg/dL   Alkaline Phosphatase 65 39 - 117 U/L   AST 20 0 - 37 U/L   ALT 28 0 - 53 U/L   Total Protein 6.8 6.0 - 8.3 g/dL   Albumin 4.1 3.5 - 5.2 g/dL   Calcium 9.3 8.4 - 10.5 mg/dL   GFR 69.94 >60.00  mL/min  CULTURE, URINE COMPREHENSIVE     Status: None   Collection Time: 11/03/14 11:58 AM  Result Value Ref Range   Colony Count NO GROWTH    Organism ID, Bacteria NO GROWTH   Urine Microscopic     Status: Abnormal   Collection Time: 11/03/14 11:58 AM  Result Value Ref Range   WBC, UA 21-50/hpf (A) 0-2/hpf   RBC / HPF 3-6/hpf (A) 0-2/hpf   Squamous Epithelial / LPF Rare(0-4/hpf) Rare(0-4/hpf)  CBC with Differential/Platelet     Status: Abnormal   Collection Time: 11/14/14  6:25 AM  Result Value Ref Range   WBC 7.1 4.0 - 10.5 K/uL   RBC 4.67 4.22 - 5.81 MIL/uL   Hemoglobin 14.7 13.0 - 17.0 g/dL   HCT 44.0 39.0 - 52.0 %   MCV 94.2 78.0 - 100.0 fL   MCH 31.5 26.0 - 34.0 pg   MCHC 33.4 30.0 - 36.0 g/dL   RDW 13.0 11.5 - 15.5 %   Platelets 260 150 - 400 K/uL   Neutrophils Relative % 58 43 - 77 %   Neutro Abs 4.2 1.7 - 7.7 K/uL   Lymphocytes Relative 28 12 - 46 %   Lymphs Abs 2.0 0.7 - 4.0 K/uL   Monocytes Relative 7 3 - 12 %   Monocytes Absolute 0.5 0.1 - 1.0 K/uL   Eosinophils Relative 6 (H) 0 - 5 %   Eosinophils Absolute 0.5 0.0 - 0.7 K/uL   Basophils Relative 1 0 - 1 %   Basophils Absolute 0.0 0.0 - 0.1 K/uL  Basic metabolic panel     Status: Abnormal   Collection Time: 11/14/14  6:25 AM  Result Value Ref Range   Sodium 136 135 - 145 mmol/L   Potassium 4.2 3.5 - 5.1 mmol/L   Chloride 106 96 - 112 mmol/L   CO2 26 19 - 32 mmol/L   Glucose, Bld 122 (H) 70 - 99 mg/dL   BUN 16 6 - 23 mg/dL   Creatinine, Ser 1.16  0.50 - 1.35 mg/dL   Calcium 9.2 8.4 - 10.5 mg/dL   GFR calc non Af Amer 64 (L) >90 mL/min   GFR calc Af Amer 75 (L) >90 mL/min    Comment: (NOTE) The eGFR has been calculated using the CKD EPI equation. This calculation has not been validated in all clinical situations. eGFR's persistently <90 mL/min signify possible Chronic Kidney Disease.    Anion gap 4 (L) 5 - 15  Troponin I     Status: None   Collection Time: 11/14/14  6:26 AM  Result Value Ref Range    Troponin I <0.03 <0.031 ng/mL    Comment:        NO INDICATION OF MYOCARDIAL INJURY.   Troponin I     Status: None   Collection Time: 11/14/14  9:10 AM  Result Value Ref Range   Troponin I <0.03 <0.031 ng/mL    Comment:        NO INDICATION OF MYOCARDIAL INJURY.   Troponin I     Status: None   Collection Time: 11/14/14 11:09 AM  Result Value Ref Range   Troponin I <0.03 <0.031 ng/mL    Comment:        NO INDICATION OF MYOCARDIAL INJURY.   CBC with Differential     Status: Abnormal   Collection Time: 12/21/14  2:35 AM  Result Value Ref Range   WBC 6.1 4.0 - 10.5 K/uL   RBC 4.58 4.22 - 5.81 MIL/uL   Hemoglobin 14.5 13.0 - 17.0 g/dL   HCT 43.3 39.0 - 52.0 %   MCV 94.5 78.0 - 100.0 fL   MCH 31.7 26.0 - 34.0 pg   MCHC 33.5 30.0 - 36.0 g/dL   RDW 13.5 11.5 - 15.5 %   Platelets 256 150 - 400 K/uL   Neutrophils Relative % 60 43 - 77 %   Neutro Abs 3.7 1.7 - 7.7 K/uL   Lymphocytes Relative 26 12 - 46 %   Lymphs Abs 1.6 0.7 - 4.0 K/uL   Monocytes Relative 8 3 - 12 %   Monocytes Absolute 0.5 0.1 - 1.0 K/uL   Eosinophils Relative 7 (H) 0 - 5 %   Eosinophils Absolute 0.4 0.0 - 0.7 K/uL   Basophils Relative 1 0 - 1 %   Basophils Absolute 0.0 0.0 - 0.1 K/uL  Basic metabolic panel     Status: Abnormal   Collection Time: 12/21/14  2:35 AM  Result Value Ref Range   Sodium 135 135 - 145 mmol/L   Potassium 3.9 3.5 - 5.1 mmol/L   Chloride 98 96 - 112 mmol/L   CO2 26 19 - 32 mmol/L   Glucose, Bld 121 (H) 70 - 99 mg/dL   BUN 15 6 - 23 mg/dL   Creatinine, Ser 1.19 0.50 - 1.35 mg/dL   Calcium 9.2 8.4 - 10.5 mg/dL   GFR calc non Af Amer 62 (L) >90 mL/min   GFR calc Af Amer 72 (L) >90 mL/min    Comment: (NOTE) The eGFR has been calculated using the CKD EPI equation. This calculation has not been validated in all clinical situations. eGFR's persistently <90 mL/min signify possible Chronic Kidney Disease.    Anion gap 11 5 - 15  CBC     Status: None   Collection Time: 01/09/15  11:20 AM  Result Value Ref Range   WBC 7.5 4.0 - 10.5 K/uL   RBC 4.51 4.22 - 5.81 MIL/uL   Hemoglobin 14.2 13.0 - 17.0  g/dL   HCT 42.5 39.0 - 52.0 %   MCV 94.2 78.0 - 100.0 fL   MCH 31.5 26.0 - 34.0 pg   MCHC 33.4 30.0 - 36.0 g/dL   RDW 13.6 11.5 - 15.5 %   Platelets 199 150 - 400 K/uL  Basic metabolic panel     Status: Abnormal   Collection Time: 01/09/15 11:20 AM  Result Value Ref Range   Sodium 138 135 - 145 mmol/L   Potassium 4.1 3.5 - 5.1 mmol/L   Chloride 102 96 - 112 mmol/L   CO2 28 19 - 32 mmol/L   Glucose, Bld 134 (H) 70 - 99 mg/dL   BUN 10 6 - 23 mg/dL   Creatinine, Ser 1.16 0.50 - 1.35 mg/dL   Calcium 8.8 8.4 - 10.5 mg/dL   GFR calc non Af Amer 64 (L) >90 mL/min   GFR calc Af Amer 75 (L) >90 mL/min    Comment: (NOTE) The eGFR has been calculated using the CKD EPI equation. This calculation has not been validated in all clinical situations. eGFR's persistently <90 mL/min signify possible Chronic Kidney Disease.    Anion gap 8 5 - 15  Troponin I     Status: None   Collection Time: 01/09/15 12:24 PM  Result Value Ref Range   Troponin I <0.03 <0.031 ng/mL    Comment:        NO INDICATION OF MYOCARDIAL INJURY.     Assessment/Plan: COPD exacerbation Resolving.  Complete Prednisone.  Continue chronic COPD medications.   Acute bacterial bronchitis Rx Doxycycline.  Supportive measures discussed.

## 2015-01-11 NOTE — Progress Notes (Signed)
Pre visit review using our clinic review tool, if applicable. No additional management support is needed unless otherwise documented below in the visit note/SLS  

## 2015-01-12 ENCOUNTER — Ambulatory Visit: Payer: Medicare Other | Admitting: Rehabilitation

## 2015-01-12 ENCOUNTER — Encounter: Payer: Self-pay | Admitting: Rehabilitation

## 2015-01-12 DIAGNOSIS — R269 Unspecified abnormalities of gait and mobility: Secondary | ICD-10-CM

## 2015-01-12 DIAGNOSIS — R262 Difficulty in walking, not elsewhere classified: Secondary | ICD-10-CM

## 2015-01-12 NOTE — Therapy (Signed)
[facility] [address]  [place], [place], [address] Phone: [phone]   Fax:  [phone]  Physical Therapy Treatment  Patient Details  Name: [name] MRN: [id number] Date of Birth: October 06, 1951 Referring Provider:  Brunetta Jeans, PA-C  Encounter Date: 01/12/2015      PT End of Session - 01/12/15 1321    Visit Number 2   Number of Visits 8   Date for PT Re-Evaluation 01/31/15   PT Start Time 7681   PT Stop Time 1358   PT Time Calculation (min) 43 min   Activity Tolerance Patient tolerated treatment well      Past Medical History  Diagnosis Date  . Hypertension   . Prostate cancer   . GERD (gastroesophageal reflux disease)   . History of TB (tuberculosis)     1984--  hospitalized for 4 month treatment  . Post-polio syndrome     polio at age 64--PT WAS IN IRON LUNG; PT WAS IN W/C UNTIL AGE 64; STILL HAS WEAKNESS RIGHT SIDE  . History of chronic bronchitis   . History of rheumatic fever   . COPD, frequent exacerbations     pulmologist-  dr Joya Gaskins--  Gold Stage C  . Bladder outlet obstruction   . Anxiety disorder   . History of urinary retention   . Nocturnal oxygen desaturation     USES O2 NIGHTLY  . Prostate cancer   . Complication of anesthesia     DIFFICULT WAKING   . Diabetes mellitus without complication     BODERLINE - DIET CONTROL  . Anxiety   . Shortness of breath dyspnea     RIGHT HEMIDIAPHRAGM ELEVATION - POST POLIO SYNDROME  . Dysrhythmia     PVC'S  . Arthritis     BILATERAL SHOULDERS, ELBOWS AND HANDS AND LEFT HIP AND KNEES--HX CORTISONE SHOTS IN SHOULDERS, ELBOWS, HIP AND KNEES    Past Surgical History  Procedure Laterality Date  . Other surgical history       Muscle & bone Graft/Polio  . Cystoscopy w/ cystogram/  transrectal ultrasound prostate bx  03-22-2009  . Cardiovascular stress test  06-08-2014  dr Mare Ferrari    normal lexiscan study/  no ischemia/  not gated due  to PAC's  . Ureterosopy stone extraction  2000  . Nasal septum surgery  2000  . Shoulder arthroscopy with open rotator cuff repair Bilateral 2013  &  1999    removal spurs and bursectomy  . Laparoscopic cholecystectomy  2013  . Prostate biopsy N/A 09/28/2014    Procedure: PROSTATE ULTRASOUND/BIOPSY;  Surgeon: [name], MD;  Location: [facility];  Service: Urology;  Laterality: N/A;  . Transurethral resection of prostate N/A 09/28/2014    Procedure: TRANSURETHRAL RESECTION OF THE PROSTATE (TURP);  Surgeon: [name], MD;  Location: [facility];  Service: Urology;  Laterality: N/A;    There were no vitals filed for this visit.  Visit Diagnosis:  Difficulty walking  Abnormality of gait      Subjective Assessment - 01/12/15 1309    Symptoms getting set up with [employer] for a new AFO.  Was in the hospital this weekend due to COPD and they increased his medication.  Reports that he is having issues with pulmonary rehab and PT at the same time.    Currently in Pain? No/denies     Bike level 1 x 36min warm-up At chair: 3 way hip 2# x 20 each bil,  mini-squats x 20, march x 20bil 2# Supine: SAQs 2# 5"x10, SLR 2x5bil with vcs to keep R toes in internal rotation Ball squeezes 5"x10, hooklying abduction green band x 20, bridging with green band abd x 20  Balance work:  In corner:  On foam; FT x 30"; head turns R/L x 10, U/D x 10, tandem stance 3x20" R/L, SL stance by treadmill 3xmax bil                                PT Long Term Goals - 01/03/15 1555    PT LONG TERM GOAL #1   Title indepedent with HEP 4/18   Time 4   Period Weeks   Status New   PT LONG TERM GOAL #2   Title improve tandem stance time to >10seconds bilaterally   Time 4   Period Weeks   Status New   PT LONG TERM GOAL #3   Title improve SL stance to >10seconds bilaterally   Time 4   Period Weeks   Status New   PT LONG TERM GOAL #4   Title report improved activity tolerance to work activities  [place]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  will add those to HEP if no symptoms after today's appointment regarding difficulty breathing or knee pain   PT Treatment/Interventions Therapeutic exercise;Balance training;Neuromuscular re-education   PT Next Visit Plan decreased base of support/Single leg balance, LE strengthening hip and knee focus, needs HEP        Problem List Patient Active Problem List   Diagnosis Date Noted  . Flank pain 11/07/2014  . Gross hematuria 10/19/2014  . Arthritis of both knees 10/17/2014  . Joint stiffness of multiple sites 10/17/2014  . Benign localized hyperplasia of prostate with urinary obstruction 09/29/2014  . COPD exacerbation 09/18/2014  . Memory loss 09/13/2014  . Pain in the chest   . Chest pain 08/18/2014  . Polyuria 08/15/2014  . Right knee pain 07/27/2014  . UTI (lower urinary tract infection) 07/14/2014  . Tuberculosis   . Left knee pain 06/30/2014  . Diverticulum of bladder 06/17/2014  . Chest pain on breathing 05/26/2014  . Unspecified constipation 04/29/2014  . Spider bite 04/29/2014  . Dehydration 04/13/2014  . Neuropathy 04/11/2014  . Concussion with no loss of consciousness 04/11/2014  . Chronic knee pain 02/22/2014  . Other malaise and fatigue 01/18/2014  . Post-polio syndrome 01/13/2014  . COPD (chronic obstructive pulmonary disease) Gold C Frequent exacerbations 01/13/2014  . Hypertension 01/13/2014  . GERD (gastroesophageal reflux disease) 01/13/2014  . Former smoker 01/13/2014  . Prostate cancer 01/13/2014  . History of tuberculosis 01/13/2014  . Bipolar disorder 01/13/2014  . Explosive personality disorder 01/13/2014  . Erectile dysfunction 01/13/2014  . Nephrolithiasis 01/13/2014  . Alcoholism 01/13/2014  . OSA (obstructive sleep apnea) 01/13/2014     Stark Bray, DPT, CMP 01/12/2015, 2:04 PM  [facility] [address]  [place], [place], [address] Phone: [phone]   Fax:  [phone]

## 2015-01-13 ENCOUNTER — Encounter (HOSPITAL_COMMUNITY): Payer: Medicare Other

## 2015-01-17 ENCOUNTER — Telehealth: Payer: Self-pay | Admitting: Physician Assistant

## 2015-01-17 ENCOUNTER — Ambulatory Visit: Payer: Medicare Other | Admitting: Rehabilitation

## 2015-01-17 MED ORDER — ALBUTEROL SULFATE HFA 108 (90 BASE) MCG/ACT IN AERS: 2.0000 | INHALATION_SPRAY | Freq: Four times a day (QID) | RESPIRATORY_TRACT | Status: DC | PRN

## 2015-01-17 NOTE — Telephone Encounter (Signed)
Caller name:Brinker, Guillermo Relation to JS:IDXF Call back [phone] Pharmacy:[facility]  Reason for call: pt is needing albuterol (PROVENTIL HFA;VENTOLIN HFA) 108 (90 BASE) MCG/ACT inhaler , states he need a new rx for it because his insurance will only pay if its written for 3 for 90 days, also he states he need a new rx for gout, states Einar Pheasant is aware of his gout problem.

## 2015-01-17 NOTE — Telephone Encounter (Signed)
Rx request for [facility] sent to pharmacy; [facility] with contact name and number for return call RE: what medication has previously been taking for Gout, as there is no medication listed in EMR per provider VO/SLS

## 2015-01-18 ENCOUNTER — Telehealth (HOSPITAL_COMMUNITY): Payer: Self-pay | Admitting: *Deleted

## 2015-01-18 ENCOUNTER — Encounter: Payer: Self-pay | Admitting: Physician Assistant

## 2015-01-18 ENCOUNTER — Ambulatory Visit (INDEPENDENT_AMBULATORY_CARE_PROVIDER_SITE_OTHER): Payer: Medicare Other | Admitting: Physician Assistant

## 2015-01-18 ENCOUNTER — Telehealth: Payer: Self-pay | Admitting: Physician Assistant

## 2015-01-18 ENCOUNTER — Encounter (HOSPITAL_COMMUNITY): Payer: Medicare Other

## 2015-01-18 VITALS — BP 135/82 | HR 60 | Temp 98.0°F | Resp 16 | Ht 73.0 in | Wt 217.0 lb

## 2015-01-18 DIAGNOSIS — M10079 Idiopathic gout, unspecified ankle and foot: Secondary | ICD-10-CM

## 2015-01-18 DIAGNOSIS — M109 Gout, unspecified: Secondary | ICD-10-CM

## 2015-01-18 MED ORDER — COLCHICINE 0.6 MG PO TABS: ORAL_TABLET | ORAL | Status: DC

## 2015-01-18 NOTE — Telephone Encounter (Addendum)
Caller name: [name] Numbers from Pulmonary will be in the office until 4:30am today and off on Wednesday.   Call back number: Pharmacy: [phone]  Reason for call:  As per Crista Elliot pt was discharged from pulmonary rehab due to poor attendance and he is receiving out patiet visit therapy. Pulmonary Informed pt when finished with PT he will be able to return back  to program.

## 2015-01-18 NOTE — Assessment & Plan Note (Signed)
Rx colchicine. Supportive measures discussed. Encouraged alcohol cessation and avoidance of triggering foods. Follow-up if symptoms not improving.

## 2015-01-18 NOTE — Patient Instructions (Signed)
Please take the colchicine as directed.  Can repeat in 3 days. Limit your alcohol and red meat intake. Also avoid shellfish or tomato based foods. Start some cherry juice.  Gout Gout is when your joints become red, sore, and swell (inflamed). This is caused by the buildup of uric acid crystals in the joints. Uric acid is a chemical that is normally in the blood. If the level of uric acid gets too high in the blood, these crystals form in your joints and tissues. Over time, these crystals can form into masses near the joints and tissues. These masses can destroy bone and cause the bone to look misshapen (deformed). HOME CARE   Do not take aspirin for pain.  Only take medicine as told by your doctor.  Rest the joint as much as you can. When in bed, keep sheets and blankets off painful areas.  Keep the sore joints raised (elevated).  Put warm or cold packs on painful joints. Use of warm or cold packs depends on which works best for you.  Use crutches if the painful joint is in your leg.  Drink enough fluids to keep your pee (urine) clear or pale yellow. Limit alcohol, sugary drinks, and drinks with fructose in them.  Follow your diet instructions. Pay careful attention to how much protein you eat. Include fruits, vegetables, whole grains, and fat-free or low-fat milk products in your daily diet. Talk to your doctor or dietitian about the use of coffee, vitamin C, and cherries. These may help lower uric acid levels.  Keep a healthy body weight. GET HELP RIGHT AWAY IF:   You have watery poop (diarrhea), throw up (vomit), or have any side effects from medicines.  You do not feel better in 24 hours, or you are getting worse.  Your joint becomes suddenly more tender, and you have chills or a fever. MAKE SURE YOU:   Understand these instructions.  Will watch your condition.  Will get help right away if you are not doing well or get worse. Document Released: 07/10/2008 Document Revised:  02/15/2014 Document Reviewed: 05/14/2012 [facility] Patient Information 2015 [place], [place]. This information is not intended to replace advice given to you by your health care provider. Make sure you discuss any questions you have with your health care provider.

## 2015-01-18 NOTE — Progress Notes (Signed)
Patient presents to clinic today c/o pain, redness and swelling of bilateral great toes 4-5 days. Patient previous history of gout. Has been well controlled previously with diet. Patient endorses recent increase in alcohol and red meat intake, prior to onset of symptoms. Patient is able to move his toes without difficulty, but states it is painful.  Past Medical History  Diagnosis Date  . Hypertension   . Prostate cancer   . GERD (gastroesophageal reflux disease)   . History of TB (tuberculosis)     1984--  hospitalized for 4 month treatment  . Post-polio syndrome     polio at age 78--PT WAS IN IRON LUNG; PT WAS IN W/C UNTIL AGE 38; STILL HAS WEAKNESS RIGHT SIDE  . History of chronic bronchitis   . History of rheumatic fever   . COPD, frequent exacerbations     pulmologist-  dr Joya Gaskins--  Gold Stage C  . Bladder outlet obstruction   . Anxiety disorder   . History of urinary retention   . Nocturnal oxygen desaturation     USES O2 NIGHTLY  . Prostate cancer   . Complication of anesthesia     DIFFICULT WAKING   . Diabetes mellitus without complication     BODERLINE - DIET CONTROL  . Anxiety   . Shortness of breath dyspnea     RIGHT HEMIDIAPHRAGM ELEVATION - POST POLIO SYNDROME  . Dysrhythmia     PVC'S  . Arthritis     BILATERAL SHOULDERS, ELBOWS AND HANDS AND LEFT HIP AND KNEES--HX CORTISONE SHOTS IN SHOULDERS, ELBOWS, HIP AND KNEES    Current Outpatient Prescriptions on File Prior to Visit  Medication Sig Dispense Refill  . albuterol (PROVENTIL HFA;VENTOLIN HFA) 108 (90 BASE) MCG/ACT inhaler Inhale 2 puffs into the lungs every 6 (six) hours as needed for wheezing or shortness of breath. 1 Inhaler 3  . ALPRAZolam (XANAX) 1 MG tablet Take 0.5 tablets (0.5 mg total) by mouth 3 (three) times daily as needed for anxiety. 90 tablet 0  . amLODipine (NORVASC) 10 MG tablet TAKE 1 TABLET (10 MG TOTAL) BY MOUTH DAILY. 90 tablet 0  . baclofen (LIORESAL) 10 MG tablet TAKE 1 TABLET (10 MG  TOTAL) BY MOUTH 2 (TWO) TIMES DAILY. 60 tablet 2  . BREO ELLIPTA 100-25 MCG/INH AEPB 1 puff. daily  1  . fluticasone (FLONASE) 50 MCG/ACT nasal spray Place 2 sprays into both nostrils daily. 16 g 12  . levalbuterol (XOPENEX HFA) 45 MCG/ACT inhaler Inhale 2 puffs into the lungs every 6 (six) hours as needed for wheezing. 1 Inhaler 12  . Linaclotide (LINZESS) 145 MCG CAPS capsule Take 1 capsule (145 mcg total) by mouth daily. 30 capsule 1  . loratadine (CLARITIN) 10 MG tablet Take 10 mg by mouth daily as needed for allergies.    . metoprolol succinate (TOPROL-XL) 25 MG 24 hr tablet Take 1 tablet (25 mg total) by mouth daily. 30 tablet 3  . montelukast (SINGULAIR) 10 MG tablet Take 1 tablet (10 mg total) by mouth at bedtime. 30 tablet 5  . omeprazole (PRILOSEC) 20 MG capsule Take 1 capsule (20 mg total) by mouth 2 (two) times daily before a meal. 60 capsule 4  . oxyCODONE-acetaminophen (PERCOCET/ROXICET) 5-325 MG per tablet Take 1-2 tablets by mouth every 6 (six) hours as needed for severe pain. 15 tablet 0  . promethazine (PHENERGAN) 12.5 MG tablet Take 1 tablet (12.5 mg total) by mouth every 8 (eight) hours as needed for nausea or vomiting.  30 tablet 0  . tiotropium (SPIRIVA) 18 MCG inhalation capsule Place 18 mcg into inhaler and inhale daily. IN AM    . zolpidem (AMBIEN) 5 MG tablet Take 1 tablet (5 mg total) by mouth at bedtime as needed for sleep. 15 tablet 1  . doxycycline (VIBRAMYCIN) 100 MG capsule Take 1 capsule (100 mg total) by mouth 2 (two) times daily. (Patient not taking: Reported on 01/18/2015) 14 capsule 0   No current facility-administered medications on file prior to visit.    Allergies  Allergen Reactions  . Ivp Dye [Iodinated Diagnostic Agents] Anaphylaxis  . Levaquin [Levofloxacin In D5w] Shortness Of Breath    In addition: sweating, chest pain, and diarrhea.   . Penicillins Anaphylaxis    Heart stops  . Aspirin Other (See Comments)    Reaction unknown  . Morphine And  Related Nausea And Vomiting  . Nsaids Other (See Comments)    Difficulty breathing    Family History  Problem Relation Age of Onset  . Alzheimer's disease Father 4    Deceased  . Stomach cancer Father   . Heart attack Father   . Heart disease Father   . Skin cancer Mother     Facial-Living  . Alcohol abuse Sister     x2  . Mental illness Sister     x2  . Diabetes Maternal Aunt     x2  . Thyroid disease Maternal Aunt     x4  . Diabetes Maternal Uncle   . Tuberculosis Paternal Grandfather   . Tuberculosis Paternal Grandmother   . Alzheimer's disease Paternal Aunt   . Alzheimer's disease Paternal Uncle     History   Social History  . Marital Status: Married    Spouse Name: N/A  . Number of Children: N/A  . Years of Education: N/A   Social History Main Topics  . Smoking status: Former Smoker -- 94 years    Quit date: 08/25/2013  . Smokeless tobacco: Never Used     Comment: hx  smoke a pack of pipe tobacco(1.5oz) every 10 days.   . Alcohol Use: 12.6 oz/week    21 Cans of beer per week     Comment: average 3 beers per day  . Drug Use: No  . Sexual Activity: Not on file   Other Topics Concern  . None   Social History Narrative   Review of Systems - See HPI.  All other ROS are negative.  BP 135/82 mmHg  Pulse 60  Temp(Src) 98 F (36.7 C) (Oral)  Resp 16  Ht '6\' 1"'  (1.854 m)  Wt 217 lb (98.431 kg)  BMI 28.64 kg/m2  SpO2 100%  Physical Exam  Constitutional: He is oriented to person, place, and time and well-developed, well-nourished, and in no distress.  HENT:  Head: Normocephalic and atraumatic.  Cardiovascular: Normal rate, regular rhythm, normal heart sounds and intact distal pulses.   Pulmonary/Chest: Effort normal and breath sounds normal. No respiratory distress. He has no wheezes. He has no rales. He exhibits no tenderness.  Musculoskeletal:  Redness, swelling and warmth noted of MTP joint of the great toes bilaterally.  Neurological: He is alert  and oriented to person, place, and time.  Skin: Skin is warm and dry. No rash noted.  Psychiatric: Affect normal.  Vitals reviewed.   Recent Results (from the past 2160 hour(s))  Urinalysis, Routine w reflex microscopic     Status: Abnormal   Collection Time: 10/31/14  5:24 PM  Result Value  Ref Range   Color, Urine YELLOW YELLOW   APPearance CLOUDY (A) CLEAR   Specific Gravity, Urine 1.021 1.005 - 1.030   pH 5.5 5.0 - 8.0   Glucose, UA NEGATIVE NEGATIVE mg/dL   Hgb urine dipstick MODERATE (A) NEGATIVE   Bilirubin Urine NEGATIVE NEGATIVE   Ketones, ur 15 (A) NEGATIVE mg/dL   Protein, ur 30 (A) NEGATIVE mg/dL   Urobilinogen, UA 0.2 0.0 - 1.0 mg/dL   Nitrite NEGATIVE NEGATIVE   Leukocytes, UA LARGE (A) NEGATIVE  Urine microscopic-add on     Status: Abnormal   Collection Time: 10/31/14  5:24 PM  Result Value Ref Range   Squamous Epithelial / LPF RARE RARE   WBC, UA TOO NUMEROUS TO COUNT <3 WBC/hpf   RBC / HPF 11-20 <3 RBC/hpf   Bacteria, UA MANY (A) RARE   Urine-Other MUCOUS PRESENT   Urine culture     Status: None   Collection Time: 10/31/14  5:24 PM  Result Value Ref Range   Specimen Description URINE, RANDOM    Special Requests NONE    Colony Count NO GROWTH Performed at [facility]     Culture NO GROWTH Performed at [facility]     Report Status 11/01/2014 FINAL   CBC     Status: None   Collection Time: 10/31/14  5:40 PM  Result Value Ref Range   WBC 6.9 4.0 - 10.5 K/uL   RBC 4.46 4.22 - 5.81 MIL/uL   Hemoglobin 14.1 13.0 - 17.0 g/dL   HCT 41.8 39.0 - 52.0 %   MCV 93.7 78.0 - 100.0 fL   MCH 31.6 26.0 - 34.0 pg   MCHC 33.7 30.0 - 36.0 g/dL   RDW 12.9 11.5 - 15.5 %   Platelets 265 150 - 400 K/uL  Comprehensive metabolic panel     Status: Abnormal   Collection Time: 10/31/14  5:40 PM  Result Value Ref Range   Sodium 136 135 - 145 mmol/L    Comment: Please note change in reference range.   Potassium 4.0 3.5 - 5.1 mmol/L    Comment: Please note  change in reference range.   Chloride 105 96 - 112 mEq/L   CO2 26 19 - 32 mmol/L   Glucose, Bld 105 (H) 70 - 99 mg/dL   BUN 18 6 - 23 mg/dL   Creatinine, Ser 1.54 (H) 0.50 - 1.35 mg/dL   Calcium 9.0 8.4 - 10.5 mg/dL   Total Protein 7.6 6.0 - 8.3 g/dL   Albumin 4.4 3.5 - 5.2 g/dL   AST 25 0 - 37 U/L   ALT 29 0 - 53 U/L   Alkaline Phosphatase 67 39 - 117 U/L   Total Bilirubin 0.8 0.3 - 1.2 mg/dL   GFR calc non Af Amer 46 (L) >90 mL/min   GFR calc Af Amer 53 (L) >90 mL/min    Comment: (NOTE) The eGFR has been calculated using the CKD EPI equation. This calculation has not been validated in all clinical situations. eGFR's persistently <90 mL/min signify possible Chronic Kidney Disease.    Anion gap 5 5 - 15  Lipase, blood     Status: None   Collection Time: 10/31/14  5:40 PM  Result Value Ref Range   Lipase 37 11 - 59 U/L  POCT urinalysis dipstick     Status: Abnormal   Collection Time: 11/03/14 11:36 AM  Result Value Ref Range   Color, UA gold    Clarity, UA cloudy  Glucose, UA neg    Bilirubin, UA neg    Ketones, UA neg    Spec Grav, UA >=1.030    Blood, UA Moderate (2+)    pH, UA 5.0    Protein, UA positive    Urobilinogen, UA 1.0    Nitrite, UA neg    Leukocytes, UA small (1+)   CBC     Status: None   Collection Time: 11/03/14 11:58 AM  Result Value Ref Range   WBC 6.1 4.0 - 10.5 K/uL   RBC 4.36 4.22 - 5.81 Mil/uL   Platelets 254.0 150.0 - 400.0 K/uL   Hemoglobin 13.7 13.0 - 17.0 g/dL   HCT 40.5 39.0 - 52.0 %   MCV 93.0 78.0 - 100.0 fl   MCHC 33.9 30.0 - 36.0 g/dL   RDW 13.8 11.5 - 15.5 %  Comp Met (CMET)     Status: Abnormal   Collection Time: 11/03/14 11:58 AM  Result Value Ref Range   Sodium 138 135 - 145 mEq/L   Potassium 4.3 3.5 - 5.1 mEq/L   Chloride 107 96 - 112 mEq/L   CO2 29 19 - 32 mEq/L   Glucose, Bld 103 (H) 70 - 99 mg/dL   BUN 19 6 - 23 mg/dL   Creatinine, Ser 1.12 0.40 - 1.50 mg/dL   Total Bilirubin 0.3 0.2 - 1.2 mg/dL   Alkaline  Phosphatase 65 39 - 117 U/L   AST 20 0 - 37 U/L   ALT 28 0 - 53 U/L   Total Protein 6.8 6.0 - 8.3 g/dL   Albumin 4.1 3.5 - 5.2 g/dL   Calcium 9.3 8.4 - 10.5 mg/dL   GFR 69.94 >60.00 mL/min  CULTURE, URINE COMPREHENSIVE     Status: None   Collection Time: 11/03/14 11:58 AM  Result Value Ref Range   Colony Count NO GROWTH    Organism ID, Bacteria NO GROWTH   Urine Microscopic     Status: Abnormal   Collection Time: 11/03/14 11:58 AM  Result Value Ref Range   WBC, UA 21-50/hpf (A) 0-2/hpf   RBC / HPF 3-6/hpf (A) 0-2/hpf   Squamous Epithelial / LPF Rare(0-4/hpf) Rare(0-4/hpf)  CBC with Differential/Platelet     Status: Abnormal   Collection Time: 11/14/14  6:25 AM  Result Value Ref Range   WBC 7.1 4.0 - 10.5 K/uL   RBC 4.67 4.22 - 5.81 MIL/uL   Hemoglobin 14.7 13.0 - 17.0 g/dL   HCT 44.0 39.0 - 52.0 %   MCV 94.2 78.0 - 100.0 fL   MCH 31.5 26.0 - 34.0 pg   MCHC 33.4 30.0 - 36.0 g/dL   RDW 13.0 11.5 - 15.5 %   Platelets 260 150 - 400 K/uL   Neutrophils Relative % 58 43 - 77 %   Neutro Abs 4.2 1.7 - 7.7 K/uL   Lymphocytes Relative 28 12 - 46 %   Lymphs Abs 2.0 0.7 - 4.0 K/uL   Monocytes Relative 7 3 - 12 %   Monocytes Absolute 0.5 0.1 - 1.0 K/uL   Eosinophils Relative 6 (H) 0 - 5 %   Eosinophils Absolute 0.5 0.0 - 0.7 K/uL   Basophils Relative 1 0 - 1 %   Basophils Absolute 0.0 0.0 - 0.1 K/uL  Basic metabolic panel     Status: Abnormal   Collection Time: 11/14/14  6:25 AM  Result Value Ref Range   Sodium 136 135 - 145 mmol/L   Potassium 4.2 3.5 - 5.1 mmol/L  Chloride 106 96 - 112 mmol/L   CO2 26 19 - 32 mmol/L   Glucose, Bld 122 (H) 70 - 99 mg/dL   BUN 16 6 - 23 mg/dL   Creatinine, Ser 1.16 0.50 - 1.35 mg/dL   Calcium 9.2 8.4 - 10.5 mg/dL   GFR calc non Af Amer 64 (L) >90 mL/min   GFR calc Af Amer 75 (L) >90 mL/min    Comment: (NOTE) The eGFR has been calculated using the CKD EPI equation. This calculation has not been validated in all clinical situations. eGFR's  persistently <90 mL/min signify possible Chronic Kidney Disease.    Anion gap 4 (L) 5 - 15  Troponin I     Status: None   Collection Time: 11/14/14  6:26 AM  Result Value Ref Range   Troponin I <0.03 <0.031 ng/mL    Comment:        NO INDICATION OF MYOCARDIAL INJURY.   Troponin I     Status: None   Collection Time: 11/14/14  9:10 AM  Result Value Ref Range   Troponin I <0.03 <0.031 ng/mL    Comment:        NO INDICATION OF MYOCARDIAL INJURY.   Troponin I     Status: None   Collection Time: 11/14/14 11:09 AM  Result Value Ref Range   Troponin I <0.03 <0.031 ng/mL    Comment:        NO INDICATION OF MYOCARDIAL INJURY.   CBC with Differential     Status: Abnormal   Collection Time: 12/21/14  2:35 AM  Result Value Ref Range   WBC 6.1 4.0 - 10.5 K/uL   RBC 4.58 4.22 - 5.81 MIL/uL   Hemoglobin 14.5 13.0 - 17.0 g/dL   HCT 43.3 39.0 - 52.0 %   MCV 94.5 78.0 - 100.0 fL   MCH 31.7 26.0 - 34.0 pg   MCHC 33.5 30.0 - 36.0 g/dL   RDW 13.5 11.5 - 15.5 %   Platelets 256 150 - 400 K/uL   Neutrophils Relative % 60 43 - 77 %   Neutro Abs 3.7 1.7 - 7.7 K/uL   Lymphocytes Relative 26 12 - 46 %   Lymphs Abs 1.6 0.7 - 4.0 K/uL   Monocytes Relative 8 3 - 12 %   Monocytes Absolute 0.5 0.1 - 1.0 K/uL   Eosinophils Relative 7 (H) 0 - 5 %   Eosinophils Absolute 0.4 0.0 - 0.7 K/uL   Basophils Relative 1 0 - 1 %   Basophils Absolute 0.0 0.0 - 0.1 K/uL  Basic metabolic panel     Status: Abnormal   Collection Time: 12/21/14  2:35 AM  Result Value Ref Range   Sodium 135 135 - 145 mmol/L   Potassium 3.9 3.5 - 5.1 mmol/L   Chloride 98 96 - 112 mmol/L   CO2 26 19 - 32 mmol/L   Glucose, Bld 121 (H) 70 - 99 mg/dL   BUN 15 6 - 23 mg/dL   Creatinine, Ser 1.19 0.50 - 1.35 mg/dL   Calcium 9.2 8.4 - 10.5 mg/dL   GFR calc non Af Amer 62 (L) >90 mL/min   GFR calc Af Amer 72 (L) >90 mL/min    Comment: (NOTE) The eGFR has been calculated using the CKD EPI equation. This calculation has not been  validated in all clinical situations. eGFR's persistently <90 mL/min signify possible Chronic Kidney Disease.    Anion gap 11 5 - 15  CBC  Status: None   Collection Time: 01/09/15 11:20 AM  Result Value Ref Range   WBC 7.5 4.0 - 10.5 K/uL   RBC 4.51 4.22 - 5.81 MIL/uL   Hemoglobin 14.2 13.0 - 17.0 g/dL   HCT 42.5 39.0 - 52.0 %   MCV 94.2 78.0 - 100.0 fL   MCH 31.5 26.0 - 34.0 pg   MCHC 33.4 30.0 - 36.0 g/dL   RDW 13.6 11.5 - 15.5 %   Platelets 199 150 - 400 K/uL  Basic metabolic panel     Status: Abnormal   Collection Time: 01/09/15 11:20 AM  Result Value Ref Range   Sodium 138 135 - 145 mmol/L   Potassium 4.1 3.5 - 5.1 mmol/L   Chloride 102 96 - 112 mmol/L   CO2 28 19 - 32 mmol/L   Glucose, Bld 134 (H) 70 - 99 mg/dL   BUN 10 6 - 23 mg/dL   Creatinine, Ser 1.16 0.50 - 1.35 mg/dL   Calcium 8.8 8.4 - 10.5 mg/dL   GFR calc non Af Amer 64 (L) >90 mL/min   GFR calc Af Amer 75 (L) >90 mL/min    Comment: (NOTE) The eGFR has been calculated using the CKD EPI equation. This calculation has not been validated in all clinical situations. eGFR's persistently <90 mL/min signify possible Chronic Kidney Disease.    Anion gap 8 5 - 15  Troponin I     Status: None   Collection Time: 01/09/15 12:24 PM  Result Value Ref Range   Troponin I <0.03 <0.031 ng/mL    Comment:        NO INDICATION OF MYOCARDIAL INJURY.     Assessment/Plan: Acute gout Rx colchicine. Supportive measures discussed. Encouraged alcohol cessation and avoidance of triggering foods. Follow-up if symptoms not improving.

## 2015-01-18 NOTE — Progress Notes (Signed)
Pre visit review using our clinic review tool, if applicable. No additional management support is needed unless otherwise documented below in the visit note/SLS  

## 2015-01-19 ENCOUNTER — Ambulatory Visit: Payer: Medicare Other | Attending: Physician Assistant | Admitting: Rehabilitation

## 2015-01-19 ENCOUNTER — Encounter: Payer: Self-pay | Admitting: Rehabilitation

## 2015-01-19 DIAGNOSIS — R269 Unspecified abnormalities of gait and mobility: Secondary | ICD-10-CM | POA: Diagnosis not present

## 2015-01-19 DIAGNOSIS — R262 Difficulty in walking, not elsewhere classified: Secondary | ICD-10-CM | POA: Diagnosis not present

## 2015-01-19 NOTE — Therapy (Signed)
[facility] [address]  [place], [place], [address] Phone: [phone]   Fax:  [phone]  Physical Therapy Treatment  Patient Details  Name: [name] MRN: [id number] Date of Birth: 1951/10/14 Referring Provider:  Brunetta Jeans, PA-C  Encounter Date: 01/19/2015      PT End of Session - 01/19/15 1318    Visit Number 3   Number of Visits 8   Date for PT Re-Evaluation 01/31/15   PT Start Time 6433   PT Stop Time 2951   PT Time Calculation (min) 44 min   Activity Tolerance Patient tolerated treatment well      Past Medical History  Diagnosis Date  . Hypertension   . Prostate cancer   . GERD (gastroesophageal reflux disease)   . History of TB (tuberculosis)     1984--  hospitalized for 4 month treatment  . Post-polio syndrome     polio at age 64--PT WAS IN IRON LUNG; PT WAS IN W/C UNTIL AGE 64; STILL HAS WEAKNESS RIGHT SIDE  . History of chronic bronchitis   . History of rheumatic fever   . COPD, frequent exacerbations     pulmologist-  dr Joya Gaskins--  Gold Stage C  . Bladder outlet obstruction   . Anxiety disorder   . History of urinary retention   . Nocturnal oxygen desaturation     USES O2 NIGHTLY  . Prostate cancer   . Complication of anesthesia     DIFFICULT WAKING   . Diabetes mellitus without complication     BODERLINE - DIET CONTROL  . Anxiety   . Shortness of breath dyspnea     RIGHT HEMIDIAPHRAGM ELEVATION - POST POLIO SYNDROME  . Dysrhythmia     PVC'S  . Arthritis     BILATERAL SHOULDERS, ELBOWS AND HANDS AND LEFT HIP AND KNEES--HX CORTISONE SHOTS IN SHOULDERS, ELBOWS, HIP AND KNEES    Past Surgical History  Procedure Laterality Date  . Other surgical history       Muscle & bone Graft/Polio  . Cystoscopy w/ cystogram/  transrectal ultrasound prostate bx  03-22-2009  . Cardiovascular stress test  06-08-2014  dr Mare Ferrari    normal lexiscan study/  no ischemia/  not gated due  to PAC's  . Ureterosopy stone extraction  2000  . Nasal septum surgery  2000  . Shoulder arthroscopy with open rotator cuff repair Bilateral 2013  &  1999    removal spurs and bursectomy  . Laparoscopic cholecystectomy  2013  . Prostate biopsy N/A 09/28/2014    Procedure: PROSTATE ULTRASOUND/BIOPSY;  Surgeon: [name], MD;  Location: [facility];  Service: Urology;  Laterality: N/A;  . Transurethral resection of prostate N/A 09/28/2014    Procedure: TRANSURETHRAL RESECTION OF THE PROSTATE (TURP);  Surgeon: [name], MD;  Location: [facility];  Service: Urology;  Laterality: N/A;    There were no vitals filed for this visit.  Visit Diagnosis:  Difficulty walking  Abnormality of gait      Subjective Assessment - 01/19/15 1310    Subjective reports new onset of gout L great toe. Reports he does not want to modify anything in therapy.     Currently in Pain? No/denies      Bike level 1 x 60min warm-up Supine: SLRs bil 2x10  SAQs 4#5" x10 bil   Ball squeezes x20  hooklying abduction green band x 20,  bridging x 20  hooklying alt LE ext  with ball 2x10 Sidelying hip abd bil x15  Sit to stand x 10                                PT Long Term Goals - 01/03/15 1555    PT LONG TERM GOAL #1   Title indepedent with HEP 4/18   Time 4   Period Weeks   Status New   PT LONG TERM GOAL #2   Title improve tandem stance time to >10seconds bilaterally   Time 4   Period Weeks   Status New   PT LONG TERM GOAL #3   Title improve SL stance to >10seconds bilaterally   Time 4   Period Weeks   Status New   PT LONG TERM GOAL #4   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 held on standing TE due to new onset of gout. discussed goal of PT was to work on  balance and allow pt to become independent with HEP and then for pt to return to pulmonary rehab.    PT Next Visit Plan decreased base of support/Single leg balance, LE strengthening hip and knee focus, needs HEP        Problem List Patient Active Problem List   Diagnosis Date Noted  . Acute gout 01/18/2015  . Flank pain 11/07/2014  . Gross hematuria 10/19/2014  . Arthritis of both knees 10/17/2014  . Joint stiffness of multiple sites 10/17/2014  . Benign localized hyperplasia of prostate with urinary obstruction 09/29/2014  . COPD exacerbation 09/18/2014  . Memory loss 09/13/2014  . Pain in the chest   . Chest pain 08/18/2014  . Polyuria 08/15/2014  . Right knee pain 07/27/2014  . UTI (lower urinary tract infection) 07/14/2014  . Tuberculosis   . Left knee pain 06/30/2014  . Diverticulum of bladder 06/17/2014  . Chest pain on breathing 05/26/2014  . Unspecified constipation 04/29/2014  . Spider bite 04/29/2014  . Dehydration 04/13/2014  . Neuropathy 04/11/2014  . Concussion with no loss of consciousness 04/11/2014  . Chronic knee pain 02/22/2014  . Other malaise and fatigue 01/18/2014  . Post-polio syndrome 01/13/2014  . COPD (chronic obstructive pulmonary disease) Gold C Frequent exacerbations 01/13/2014  . Hypertension 01/13/2014  . GERD (gastroesophageal reflux disease) 01/13/2014  . Former smoker 01/13/2014  . Prostate cancer 01/13/2014  . History of tuberculosis 01/13/2014  . Bipolar disorder 01/13/2014  . Explosive personality disorder 01/13/2014  . Erectile dysfunction 01/13/2014  . Nephrolithiasis 01/13/2014  . Alcoholism 01/13/2014  . OSA (obstructive sleep apnea) 01/13/2014    Stark Bray, DPT, CMP 01/19/2015, 2:00 PM  [facility] [address]  [address] [place], [place], [address] Phone: [phone]   Fax:  [phone]

## 2015-01-20 ENCOUNTER — Encounter (HOSPITAL_COMMUNITY): Payer: Medicare Other

## 2015-01-21 ENCOUNTER — Encounter: Payer: Self-pay | Admitting: Critical Care Medicine

## 2015-01-21 NOTE — Telephone Encounter (Signed)
Can we try to reach RN again. I understand dismissal if it is due to no-show, but there is no reason he cannot participate in Pulmonary rehab while in PT. The PT is just to help strengthen his leg.

## 2015-01-24 ENCOUNTER — Ambulatory Visit: Payer: Medicare Other | Admitting: Rehabilitation

## 2015-01-24 DIAGNOSIS — R269 Unspecified abnormalities of gait and mobility: Secondary | ICD-10-CM | POA: Diagnosis not present

## 2015-01-24 DIAGNOSIS — R262 Difficulty in walking, not elsewhere classified: Secondary | ICD-10-CM

## 2015-01-24 NOTE — Telephone Encounter (Signed)
Pt returning your call, states that the [facility] did indeed contact him however it was 10 days ago and have not contacted him back anymore, would like for you to call him back

## 2015-01-24 NOTE — Telephone Encounter (Signed)
LMOM with contact name and number for return call RE: status on noctural Oxygen via Maudie Mercury w/[facility]/SLS  Spoke with Pulmonary Rehab, Dierdre Harness is not in the office until tomorrow morning; according to person spoken with, this is an Insurance underwriter issue most of the time, they will not cover both at same time; if this is not the case with this patient, they will have Dierdre Harness call back tomorrow as to why/SLS

## 2015-01-24 NOTE — Therapy (Signed)
[facility] [address]  [place], [place], [address] Phone: [phone]   Fax:  [phone]  Physical Therapy Treatment  Patient Details  Name: [name] MRN: [id number] Date of Birth: December 04, 1950 Referring Provider:  Brunetta Jeans, PA-C  Encounter Date: 01/24/2015      PT End of Session - 01/24/15 1302    Visit Number 4   Number of Visits 8   Date for PT Re-Evaluation 01/31/15   PT Start Time 1307   PT Stop Time 1356   PT Time Calculation (min) 49 min   Activity Tolerance Patient tolerated treatment well      Past Medical History  Diagnosis Date  . Hypertension   . Prostate cancer   . GERD (gastroesophageal reflux disease)   . History of TB (tuberculosis)     1984--  hospitalized for 4 month treatment  . Post-polio syndrome     polio at age 64--PT WAS IN IRON LUNG; PT WAS IN W/C UNTIL AGE 64; STILL HAS WEAKNESS RIGHT SIDE  . History of chronic bronchitis   . History of rheumatic fever   . COPD, frequent exacerbations     pulmologist-  dr Joya Gaskins--  Gold Stage C  . Bladder outlet obstruction   . Anxiety disorder   . History of urinary retention   . Nocturnal oxygen desaturation     USES O2 NIGHTLY  . Prostate cancer   . Complication of anesthesia     DIFFICULT WAKING   . Diabetes mellitus without complication     BODERLINE - DIET CONTROL  . Anxiety   . Shortness of breath dyspnea     RIGHT HEMIDIAPHRAGM ELEVATION - POST POLIO SYNDROME  . Dysrhythmia     PVC'S  . Arthritis     BILATERAL SHOULDERS, ELBOWS AND HANDS AND LEFT HIP AND KNEES--HX CORTISONE SHOTS IN SHOULDERS, ELBOWS, HIP AND KNEES    Past Surgical History  Procedure Laterality Date  . Other surgical history       Muscle & bone Graft/Polio  . Cystoscopy w/ cystogram/  transrectal ultrasound prostate bx  03-22-2009  . Cardiovascular stress test  06-08-2014  dr Mare Ferrari    normal lexiscan study/  no ischemia/  not gated due  to PAC's  . Ureterosopy stone extraction  2000  . Nasal septum surgery  2000  . Shoulder arthroscopy with open rotator cuff repair Bilateral 2013  &  1999    removal spurs and bursectomy  . Laparoscopic cholecystectomy  2013  . Prostate biopsy N/A 09/28/2014    Procedure: PROSTATE ULTRASOUND/BIOPSY;  Surgeon: [name], MD;  Location: [facility];  Service: Urology;  Laterality: N/A;  . Transurethral resection of prostate N/A 09/28/2014    Procedure: TRANSURETHRAL RESECTION OF THE PROSTATE (TURP);  Surgeon: [name], MD;  Location: [facility];  Service: Urology;  Laterality: N/A;    There were no vitals filed for this visit.  Visit Diagnosis:  Difficulty walking  Abnormality of gait      Subjective Assessment - 01/24/15 1306    Subjective No c/o's. Felt like he got more of a workout last visit. Has stopped pulmonary rehab for the moment until he is done with PT then he will resume.    Currently in Pain? No/denies      Bike level 1 x 26min warm-up Supine: Bridging 3" x 20  SAQs 4# 5" x12 bil  SLRs bil 2x12  Hooklying single hip abduction  blue band x10   Hooklying single hip adduction blue band x10 Mini-squats x15 On blue foam: marching x10 (with HHA on chair)   Alternating hip abduction x10 (with HHA on chair) LAQ with ball squeeze 5"x10        PT Long Term Goals - 01/03/15 1555    PT LONG TERM GOAL #1   Title indepedent with HEP 4/18   Time 4   Period Weeks   Status New   PT LONG TERM GOAL #2   Title improve tandem stance time to >10seconds bilaterally   Time 4   Period Weeks   Status New   PT LONG TERM GOAL #3   Title improve SL stance to >10seconds bilaterally   Time 4   Period Weeks   Status New   PT LONG TERM GOAL #4   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    PT Next Visit Plan SLS balance, standing endurance, LE strengthening. Increase HEP   Consulted and Agree with Plan of Care Patient        Problem List Patient Active Problem List   Diagnosis Date Noted  . Acute gout 01/18/2015  . Flank pain 11/07/2014  . Gross hematuria 10/19/2014  . Arthritis of both knees 10/17/2014  . Joint stiffness of multiple sites 10/17/2014  . Benign localized hyperplasia of prostate with urinary obstruction 09/29/2014  . COPD exacerbation 09/18/2014  . Memory loss 09/13/2014  . Pain in the chest   . Chest pain 08/18/2014  . Polyuria 08/15/2014  . Right knee pain 07/27/2014  . UTI (lower urinary tract infection) 07/14/2014  . Tuberculosis   . Left knee pain 06/30/2014  . Diverticulum of bladder 06/17/2014  . Chest pain on breathing 05/26/2014  . Unspecified constipation 04/29/2014  . Spider bite 04/29/2014  . Dehydration 04/13/2014  . Neuropathy 04/11/2014  . Concussion with no loss of consciousness 04/11/2014  . Chronic knee pain 02/22/2014  . Other malaise and fatigue 01/18/2014  . Post-polio syndrome 01/13/2014  . COPD (chronic obstructive pulmonary disease) Gold C Frequent exacerbations 01/13/2014  . Hypertension 01/13/2014  . GERD (gastroesophageal reflux disease) 01/13/2014  . Former smoker 01/13/2014  . Prostate cancer 01/13/2014  . History of tuberculosis 01/13/2014  . Bipolar disorder 01/13/2014  . Explosive personality disorder 01/13/2014  . Erectile dysfunction 01/13/2014  . Nephrolithiasis 01/13/2014  . Alcoholism 01/13/2014  . OSA (obstructive sleep apnea) 01/13/2014    Barbette Hair, PTA 01/24/2015, 1:57 PM  [facility] [address]  [place], [place], [address] Phone: [phone]   Fax:  [phone]

## 2015-01-25 ENCOUNTER — Encounter (HOSPITAL_COMMUNITY): Payer: Medicare Other

## 2015-01-25 DIAGNOSIS — B9689 Other specified bacterial agents as the cause of diseases classified elsewhere: Secondary | ICD-10-CM | POA: Insufficient documentation

## 2015-01-25 DIAGNOSIS — J208 Acute bronchitis due to other specified organisms: Secondary | ICD-10-CM

## 2015-01-25 NOTE — Assessment & Plan Note (Addendum)
Resolving.  Complete Prednisone.  Continue chronic COPD medications.  Will order O2 via [facility].  Will send records.

## 2015-01-25 NOTE — Assessment & Plan Note (Signed)
Rx Doxycycline.  Supportive measures discussed.

## 2015-01-25 NOTE — Telephone Encounter (Signed)
Herbert Seta from pulmonary rehab calling back.  Patient has been discharged from program. Insurance historically will not cover for Pt and rehab at the same time. Also, patient has no showed muptiple times. [facility] [phone] Will be there all day Thursday. Not in tomorrow.  Remo Lipps states that she will see patient once PT is finished.

## 2015-01-25 NOTE — Telephone Encounter (Signed)
Inform patient that cannot get anywhere with Rehab.  They are endorsing he was dismissed due to no-shows.  Will take him back next group of classes.  Not much more I can do at present.

## 2015-01-26 ENCOUNTER — Telehealth: Payer: Self-pay | Admitting: Physician Assistant

## 2015-01-26 ENCOUNTER — Ambulatory Visit: Payer: Medicare Other | Admitting: Physical Therapy

## 2015-01-26 ENCOUNTER — Other Ambulatory Visit: Payer: Self-pay | Admitting: Physician Assistant

## 2015-01-26 DIAGNOSIS — I10 Essential (primary) hypertension: Secondary | ICD-10-CM

## 2015-01-26 MED ORDER — METOPROLOL SUCCINATE ER 25 MG PO TB24: 25.0000 mg | ORAL_TABLET | Freq: Every day | ORAL | Status: DC

## 2015-01-26 NOTE — Telephone Encounter (Signed)
Rx request to pharmacy/SLS  

## 2015-01-26 NOTE — Telephone Encounter (Signed)
Caller name: Owens Relation to pt: self Call back number: [phone] or [phone] Pharmacy: [facility] [phone]  Reason for call:   Patient is requesting a refill of metoprolol. He state that he called pharmacy last week and is now out.

## 2015-01-26 NOTE — Telephone Encounter (Signed)
Medication Detail      Disp Refills Start End     metoprolol succinate (TOPROL-XL) 25 MG 24 hr tablet 30 tablet 3 09/01/2014     Sig - Route: Take 1 tablet (25 mg total) by mouth daily. - Oral    E-Prescribing Status: Receipt confirmed by pharmacy (09/01/2014 3:02 PM EST)   Associated Diagnoses    Essential hypertension     Pharmacy    [id number] - [place], [address]   Rx request to pharmacy [[place]]; patient should not be out of medication/SLS

## 2015-01-27 ENCOUNTER — Encounter (HOSPITAL_COMMUNITY): Payer: Medicare Other

## 2015-01-27 DIAGNOSIS — J449 Chronic obstructive pulmonary disease, unspecified: Secondary | ICD-10-CM | POA: Diagnosis not present

## 2015-01-31 ENCOUNTER — Ambulatory Visit: Payer: Medicare Other | Admitting: Rehabilitation

## 2015-01-31 DIAGNOSIS — R262 Difficulty in walking, not elsewhere classified: Secondary | ICD-10-CM | POA: Diagnosis not present

## 2015-01-31 DIAGNOSIS — R269 Unspecified abnormalities of gait and mobility: Secondary | ICD-10-CM | POA: Diagnosis not present

## 2015-01-31 NOTE — Therapy (Addendum)
[facility] [address]  [place], [place], [address] Phone: [phone]   Fax:  [phone]  Physical Therapy Treatment  Patient Details  Name: [name] MRN: [id number] Date of Birth: [birth year] Referring Provider:  Brunetta Jeans, PA-C  Encounter Date: 01/31/2015      PT End of Session - 01/31/15 1321    Visit Number 5   Number of Visits 8   Date for PT Re-Evaluation 01/31/15   PT Start Time 1761   PT Stop Time 1400   PT Time Calculation (min) 42 min      Past Medical History  Diagnosis Date  . Hypertension   . Prostate cancer   . GERD (gastroesophageal reflux disease)   . History of TB (tuberculosis)     1984--  hospitalized for 4 month treatment  . Post-polio syndrome     polio at age 47--PT WAS IN IRON LUNG; PT WAS IN W/C UNTIL AGE 88; STILL HAS WEAKNESS RIGHT SIDE  . History of chronic bronchitis   . History of rheumatic fever   . COPD, frequent exacerbations     pulmologist-  dr Joya Gaskins--  Gold Stage C  . Bladder outlet obstruction   . Anxiety disorder   . History of urinary retention   . Nocturnal oxygen desaturation     USES O2 NIGHTLY  . Prostate cancer   . Complication of anesthesia     DIFFICULT WAKING   . Diabetes mellitus without complication     BODERLINE - DIET CONTROL  . Anxiety   . Shortness of breath dyspnea     RIGHT HEMIDIAPHRAGM ELEVATION - POST POLIO SYNDROME  . Dysrhythmia     PVC'S  . Arthritis     BILATERAL SHOULDERS, ELBOWS AND HANDS AND LEFT HIP AND KNEES--HX CORTISONE SHOTS IN SHOULDERS, ELBOWS, HIP AND KNEES    Past Surgical History  Procedure Laterality Date  . Other surgical history       Muscle & bone Graft/Polio  . Cystoscopy w/ cystogram/  transrectal ultrasound prostate bx  03-22-2009  . Cardiovascular stress test  06-08-2014  dr Mare Ferrari    normal lexiscan study/  no ischemia/  not gated due to PAC's  . Ureterosopy stone extraction  2000  .  Nasal septum surgery  2000  . Shoulder arthroscopy with open rotator cuff repair Bilateral 2013  &  1999    removal spurs and bursectomy  . Laparoscopic cholecystectomy  2013  . Prostate biopsy N/A 09/28/2014    Procedure: PROSTATE ULTRASOUND/BIOPSY;  Surgeon: [name], MD;  Location: [facility];  Service: Urology;  Laterality: N/A;  . Transurethral resection of prostate N/A 09/28/2014    Procedure: TRANSURETHRAL RESECTION OF THE PROSTATE (TURP);  Surgeon: [name], MD;  Location: [facility];  Service: Urology;  Laterality: N/A;    There were no vitals filed for this visit.  Visit Diagnosis:  Difficulty walking  Abnormality of gait      Subjective Assessment - 01/31/15 1321    Subjective "Its another great day." Wants to finish therapy before returning to pulmonary rehab, would like to make up the days he missed.    Currently in Pain? No/denies      Bike level 1 x 32mn warm-up Standing hip abduction, extension green TB x10 (HHA on chair)  Mini-squats x15 (HHA on chair) Slow marching on blue foam 2x10 with (1 HHA on chair) Tandam stance on blue foam 2x20" (with HHA  on chair PRN) Bridging x20 SLRs bilateral 2x12 LAQ with ball squeeze 4# 5"x10  Increased HEP       PT Long Term Goals - 01/31/15 1332    PT LONG TERM GOAL #1   Title indepedent with HEP 4/18   Status On-going   PT LONG TERM GOAL #2   Title improve tandem stance time to >10seconds bilaterally  Lt 15+", Rt 5-8"    Status On-going   PT LONG TERM GOAL #3   Title improve SL stance to >10seconds bilaterally  15" on Lt, 3-5" on Rt   Status On-going   PT LONG TERM GOAL #4   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 Pt would like to continue for another 2 or so weeks to make up for his  appointments missed. Increased HEP today.    PT Next Visit Plan SLS balance, standing endurance, LE strengthening. Renewal?   Consulted and Agree with Plan of Care Patient        Problem List Patient Active Problem List   Diagnosis Date Noted  . Acute bacterial bronchitis 01/25/2015  . Acute gout 01/18/2015  . Flank pain 11/07/2014  . Gross hematuria 10/19/2014  . Arthritis of both knees 10/17/2014  . Joint stiffness of multiple sites 10/17/2014  . Benign localized hyperplasia of prostate with urinary obstruction 09/29/2014  . COPD exacerbation 09/18/2014  . Memory loss 09/13/2014  . Pain in the chest   . Chest pain 08/18/2014  . Polyuria 08/15/2014  . Right knee pain 07/27/2014  . UTI (lower urinary tract infection) 07/14/2014  . Tuberculosis   . Left knee pain 06/30/2014  . Diverticulum of bladder 06/17/2014  . Chest pain on breathing 05/26/2014  . Unspecified constipation 04/29/2014  . Spider bite 04/29/2014  . Dehydration 04/13/2014  . Neuropathy 04/11/2014  . Concussion with no loss of consciousness 04/11/2014  . Chronic knee pain 02/22/2014  . Other malaise and fatigue 01/18/2014  . Post-polio syndrome 01/13/2014  . COPD (chronic obstructive pulmonary disease) Gold C Frequent exacerbations 01/13/2014  . Hypertension 01/13/2014  . GERD (gastroesophageal reflux disease) 01/13/2014  . Former smoker 01/13/2014  . Prostate cancer 01/13/2014  . History of tuberculosis 01/13/2014  . Bipolar disorder 01/13/2014  . Explosive personality disorder 01/13/2014  . Erectile dysfunction 01/13/2014  . Nephrolithiasis 01/13/2014  . Alcoholism 01/13/2014  . OSA (obstructive sleep apnea) 01/13/2014    Barbette Hair, PTA 01/31/2015, 2:00 PM  [facility] [address]  [place], [place], [address] Phone: [phone]   Fax:  [phone]    PHYSICAL THERAPY DISCHARGE SUMMARY  Visits from Start of Care:  5  Current functional level related to goals / functional outcomes: No goals met   Remaining deficits: Impaired balance     Plan: Patient agrees to discharge.  Patient goals were not met. Patient is being discharged due to not returning since the last visit.  ?????       Mr. [name] was last seen 01/31/15.  He has not returned since that PT session and is therefore being discharged from our care at this time.  Leonette Most PT, OCS 03/23/2015 1:26 PM

## 2015-02-01 ENCOUNTER — Encounter (HOSPITAL_COMMUNITY): Payer: Medicare Other

## 2015-02-02 ENCOUNTER — Telehealth: Payer: Self-pay | Admitting: Physician Assistant

## 2015-02-02 ENCOUNTER — Ambulatory Visit: Payer: Medicare Other | Admitting: Rehabilitation

## 2015-02-02 DIAGNOSIS — D229 Melanocytic nevi, unspecified: Secondary | ICD-10-CM

## 2015-02-02 NOTE — Telephone Encounter (Signed)
Referral placed.

## 2015-02-02 NOTE — Telephone Encounter (Signed)
Relation to pt: self  Call back number: [phone]   Reason for call:  Pt requesting a referral to dermatologist for the moles on face and neck would like them removed.

## 2015-02-03 ENCOUNTER — Encounter (HOSPITAL_BASED_OUTPATIENT_CLINIC_OR_DEPARTMENT_OTHER): Payer: Self-pay | Admitting: *Deleted

## 2015-02-03 ENCOUNTER — Encounter: Payer: Self-pay | Admitting: Physician Assistant

## 2015-02-03 ENCOUNTER — Encounter (HOSPITAL_COMMUNITY): Payer: Medicare Other

## 2015-02-03 ENCOUNTER — Emergency Department (HOSPITAL_BASED_OUTPATIENT_CLINIC_OR_DEPARTMENT_OTHER): Payer: Medicare Other

## 2015-02-03 ENCOUNTER — Emergency Department (HOSPITAL_BASED_OUTPATIENT_CLINIC_OR_DEPARTMENT_OTHER)
Admission: EM | Admit: 2015-02-03 | Discharge: 2015-02-04 | Disposition: A | Discharge status: Home / Self Care | Payer: Medicare Other | Attending: Emergency Medicine | Admitting: Emergency Medicine

## 2015-02-03 DIAGNOSIS — E119 Type 2 diabetes mellitus without complications: Secondary | ICD-10-CM | POA: Diagnosis not present

## 2015-02-03 DIAGNOSIS — Z9981 Dependence on supplemental oxygen: Secondary | ICD-10-CM | POA: Insufficient documentation

## 2015-02-03 DIAGNOSIS — I10 Essential (primary) hypertension: Secondary | ICD-10-CM | POA: Insufficient documentation

## 2015-02-03 DIAGNOSIS — Z79899 Other long term (current) drug therapy: Secondary | ICD-10-CM | POA: Insufficient documentation

## 2015-02-03 DIAGNOSIS — R630 Anorexia: Secondary | ICD-10-CM | POA: Insufficient documentation

## 2015-02-03 DIAGNOSIS — K219 Gastro-esophageal reflux disease without esophagitis: Secondary | ICD-10-CM | POA: Diagnosis not present

## 2015-02-03 DIAGNOSIS — Z8546 Personal history of malignant neoplasm of prostate: Secondary | ICD-10-CM | POA: Diagnosis not present

## 2015-02-03 DIAGNOSIS — G4734 Idiopathic sleep related nonobstructive alveolar hypoventilation: Secondary | ICD-10-CM | POA: Diagnosis not present

## 2015-02-03 DIAGNOSIS — Z880 Allergy status to penicillin: Secondary | ICD-10-CM | POA: Insufficient documentation

## 2015-02-03 DIAGNOSIS — Z8619 Personal history of other infectious and parasitic diseases: Secondary | ICD-10-CM | POA: Insufficient documentation

## 2015-02-03 DIAGNOSIS — Z7951 Long term (current) use of inhaled steroids: Secondary | ICD-10-CM | POA: Diagnosis not present

## 2015-02-03 DIAGNOSIS — Z87448 Personal history of other diseases of urinary system: Secondary | ICD-10-CM | POA: Diagnosis not present

## 2015-02-03 DIAGNOSIS — F419 Anxiety disorder, unspecified: Secondary | ICD-10-CM | POA: Diagnosis not present

## 2015-02-03 DIAGNOSIS — Z87891 Personal history of nicotine dependence: Secondary | ICD-10-CM | POA: Insufficient documentation

## 2015-02-03 DIAGNOSIS — M1990 Unspecified osteoarthritis, unspecified site: Secondary | ICD-10-CM | POA: Insufficient documentation

## 2015-02-03 DIAGNOSIS — I499 Cardiac arrhythmia, unspecified: Secondary | ICD-10-CM | POA: Diagnosis not present

## 2015-02-03 DIAGNOSIS — Z8611 Personal history of tuberculosis: Secondary | ICD-10-CM | POA: Insufficient documentation

## 2015-02-03 DIAGNOSIS — R1011 Right upper quadrant pain: Secondary | ICD-10-CM | POA: Diagnosis not present

## 2015-02-03 DIAGNOSIS — Z8612 Personal history of poliomyelitis: Secondary | ICD-10-CM | POA: Diagnosis not present

## 2015-02-03 DIAGNOSIS — R1031 Right lower quadrant pain: Secondary | ICD-10-CM | POA: Diagnosis not present

## 2015-02-03 DIAGNOSIS — N390 Urinary tract infection, site not specified: Secondary | ICD-10-CM | POA: Diagnosis not present

## 2015-02-03 DIAGNOSIS — J449 Chronic obstructive pulmonary disease, unspecified: Secondary | ICD-10-CM | POA: Diagnosis not present

## 2015-02-03 DIAGNOSIS — K760 Fatty (change of) liver, not elsewhere classified: Secondary | ICD-10-CM | POA: Diagnosis not present

## 2015-02-03 LAB — CBC WITH DIFFERENTIAL/PLATELET
BASOS PCT: 0 % (ref 0–1)
Basophils Absolute: 0 10*3/uL (ref 0.0–0.1)
EOS PCT: 6 % — AB (ref 0–5)
Eosinophils Absolute: 0.3 10*3/uL (ref 0.0–0.7)
HCT: 40.8 % (ref 39.0–52.0)
HEMOGLOBIN: 14.1 g/dL (ref 13.0–17.0)
LYMPHS ABS: 1.6 10*3/uL (ref 0.7–4.0)
LYMPHS PCT: 29 % (ref 12–46)
MCH: 32.3 pg (ref 26.0–34.0)
MCHC: 34.6 g/dL (ref 30.0–36.0)
MCV: 93.6 fL (ref 78.0–100.0)
MONOS PCT: 8 % (ref 3–12)
Monocytes Absolute: 0.4 10*3/uL (ref 0.1–1.0)
NEUTROS ABS: 3 10*3/uL (ref 1.7–7.7)
NEUTROS PCT: 57 % (ref 43–77)
Platelets: 243 10*3/uL (ref 150–400)
RBC: 4.36 MIL/uL (ref 4.22–5.81)
RDW: 13.5 % (ref 11.5–15.5)
WBC: 5.3 10*3/uL (ref 4.0–10.5)

## 2015-02-03 LAB — URINE MICROSCOPIC-ADD ON

## 2015-02-03 LAB — URINALYSIS, ROUTINE W REFLEX MICROSCOPIC
Bilirubin Urine: NEGATIVE
Glucose, UA: NEGATIVE mg/dL
Hgb urine dipstick: NEGATIVE
KETONES UR: 15 mg/dL — AB
Nitrite: NEGATIVE
Protein, ur: NEGATIVE mg/dL
Specific Gravity, Urine: 1.011 (ref 1.005–1.030)
UROBILINOGEN UA: 0.2 mg/dL (ref 0.0–1.0)
pH: 5 (ref 5.0–8.0)

## 2015-02-03 LAB — COMPREHENSIVE METABOLIC PANEL
ALBUMIN: 4.5 g/dL (ref 3.5–5.2)
ALT: 64 U/L — ABNORMAL HIGH (ref 0–53)
ANION GAP: 9 (ref 5–15)
AST: 51 U/L — AB (ref 0–37)
Alkaline Phosphatase: 59 U/L (ref 39–117)
BUN: 12 mg/dL (ref 6–23)
CO2: 23 mmol/L (ref 19–32)
CREATININE: 1.06 mg/dL (ref 0.50–1.35)
Calcium: 9.2 mg/dL (ref 8.4–10.5)
Chloride: 102 mmol/L (ref 96–112)
GFR calc Af Amer: 83 mL/min — ABNORMAL LOW (ref >90)
GFR calc non Af Amer: 72 mL/min — ABNORMAL LOW (ref >90)
GLUCOSE: 104 mg/dL — AB (ref 70–99)
Potassium: 3.7 mmol/L (ref 3.5–5.1)
Sodium: 134 mmol/L — ABNORMAL LOW (ref 135–145)
TOTAL PROTEIN: 7.8 g/dL (ref 6.0–8.3)
Total Bilirubin: 0.8 mg/dL (ref 0.3–1.2)

## 2015-02-03 MED ORDER — SULFAMETHOXAZOLE-TRIMETHOPRIM 800-160 MG PO TABS: 1.0000 | ORAL_TABLET | Freq: Two times a day (BID) | ORAL | Status: DC

## 2015-02-03 MED ORDER — HYDROMORPHONE HCL 1 MG/ML IJ SOLN
1.0000 mg | Freq: Once | INTRAMUSCULAR | Status: AC
  Administered 2015-02-03: 1 mg via INTRAVENOUS
  Filled 2015-02-03: qty 1

## 2015-02-03 MED ORDER — ONDANSETRON HCL 4 MG/2ML IJ SOLN
4.0000 mg | Freq: Once | INTRAMUSCULAR | Status: AC
  Administered 2015-02-03: 4 mg via INTRAVENOUS
  Filled 2015-02-03: qty 2

## 2015-02-03 MED ORDER — SULFAMETHOXAZOLE-TRIMETHOPRIM 800-160 MG PO TABS: 1.0000 | ORAL_TABLET | Freq: Once | ORAL | Status: DC

## 2015-02-03 MED ORDER — SODIUM CHLORIDE 0.9 % IV BOLUS (SEPSIS)
1000.0000 mL | Freq: Once | INTRAVENOUS | Status: AC
Start: 2015-02-03 — End: 2015-02-03
  Administered 2015-02-03: 1000 mL via INTRAVENOUS

## 2015-02-03 NOTE — ED Notes (Signed)
Abdominal pain for a week. Right flank pain. States Omeprazole makes it worse.

## 2015-02-03 NOTE — Discharge Instructions (Signed)
Abdominal Pain °Many things can cause abdominal pain. Usually, abdominal pain is not caused by a disease and will improve without treatment. It can often be observed and treated at home. Your health care provider will do a physical exam and possibly order blood tests and X-rays to help determine the seriousness of your pain. However, in many cases, more time must pass before a clear cause of the pain can be found. Before that point, your health care provider may not know if you need more testing or further treatment. °HOME CARE INSTRUCTIONS  °Monitor your abdominal pain for any changes. The following actions may help to alleviate any discomfort you are experiencing: °· Only take over-the-counter or prescription medicines as directed by your health care provider. °· Do not take laxatives unless directed to do so by your health care provider. °· Try a clear liquid diet (broth, tea, or water) as directed by your health care provider. Slowly move to a bland diet as tolerated. °SEEK MEDICAL CARE IF: °· You have unexplained abdominal pain. °· You have abdominal pain associated with nausea or diarrhea. °· You have pain when you urinate or have a bowel movement. °· You experience abdominal pain that wakes you in the night. °· You have abdominal pain that is worsened or improved by eating food. °· You have abdominal pain that is worsened with eating fatty foods. °· You have a fever. °SEEK IMMEDIATE MEDICAL CARE IF:  °· Your pain does not go away within 2 hours. °· You keep throwing up (vomiting). °· Your pain is felt only in portions of the abdomen, such as the right side or the left lower portion of the abdomen. °· You pass bloody or black tarry stools. °MAKE SURE YOU: °· Understand these instructions.   °· Will watch your condition.   °· Will get help right away if you are not doing well or get worse.   °Document Released: 07/11/2005 Document Revised: 10/06/2013 Document Reviewed: 06/10/2013 °ExitCare® Patient Information  ©2015 [employer]. This information is not intended to replace advice given to you by your health care provider. Make sure you discuss any questions you have with your health care provider. °Urinary Tract Infection °Urinary tract infections (UTIs) can develop anywhere along your urinary tract. Your urinary tract is your body's drainage system for removing wastes and extra water. Your urinary tract includes two kidneys, two ureters, a bladder, and a urethra. Your kidneys are a pair of bean-shaped organs. Each kidney is about the size of your fist. They are located below your ribs, one on each side of your spine. °CAUSES °Infections are caused by microbes, which are microscopic organisms, including fungi, viruses, and bacteria. These organisms are so small that they can only be seen through a microscope. Bacteria are the microbes that most commonly cause UTIs. °SYMPTOMS  °Symptoms of UTIs may vary by age and gender of the patient and by the location of the infection. Symptoms in young women typically include a frequent and intense urge to urinate and a painful, burning feeling in the bladder or urethra during urination. Older women and men are more likely to be tired, shaky, and weak and have muscle aches and abdominal pain. A fever may mean the infection is in your kidneys. Other symptoms of a kidney infection include pain in your back or sides below the ribs, nausea, and vomiting. °DIAGNOSIS °To diagnose a UTI, your caregiver will ask you about your symptoms. Your caregiver also will ask to provide a urine sample. The urine sample   will be tested for bacteria and white blood cells. White blood cells are made by your body to help fight infection. °TREATMENT  °Typically, UTIs can be treated with medication. Because most UTIs are caused by a bacterial infection, they usually can be treated with the use of antibiotics. The choice of antibiotic and length of treatment depend on your symptoms and the type of bacteria  causing your infection. °HOME CARE INSTRUCTIONS °· If you were prescribed antibiotics, take them exactly as your caregiver instructs you. Finish the medication even if you feel better after you have only taken some of the medication. °· Drink enough water and fluids to keep your urine clear or pale yellow. °· Avoid caffeine, tea, and carbonated beverages. They tend to irritate your bladder. °· Empty your bladder often. Avoid holding urine for long periods of time. °· Empty your bladder before and after sexual intercourse. °· After a bowel movement, women should cleanse from front to back. Use each tissue only once. °SEEK MEDICAL CARE IF:  °· You have back pain. °· You develop a fever. °· Your symptoms do not begin to resolve within 3 days. °SEEK IMMEDIATE MEDICAL CARE IF:  °· You have severe back pain or lower abdominal pain. °· You develop chills. °· You have nausea or vomiting. °· You have continued burning or discomfort with urination. °MAKE SURE YOU:  °· Understand these instructions. °· Will watch your condition. °· Will get help right away if you are not doing well or get worse. °Document Released: 07/11/2005 Document Revised: 04/01/2012 Document Reviewed: 11/09/2011 °ExitCare® Patient Information ©2015 [employer]. This information is not intended to replace advice given to you by your health care provider. Make sure you discuss any questions you have with your health care provider. ° °

## 2015-02-03 NOTE — ED Provider Notes (Signed)
CSN: [id number]     Arrival date & time 02/03/15  1932 History  This chart was scribed for Evelina Bucy, MD by Tula Nakayama, ED Scribe. This patient was seen in room MH07/MH07 and the patient's care was started at 9:32 PM.    Chief Complaint  Patient presents with  . Abdominal Pain   Patient is a 64 y.o. male presenting with abdominal pain. The history is provided by the patient. No language interpreter was used.  Abdominal Pain Pain location:  RLQ Pain quality: aching   Pain radiates to:  Back Pain severity:  Moderate Onset quality:  Gradual Duration:  1 week Timing:  Constant Progression:  Unchanged Chronicity:  New Relieved by:  Nothing Worsened by:  Eating Ineffective treatments:  None tried Associated symptoms: nausea   Associated symptoms: no diarrhea, no dysuria, no fever, no hematuria and no vomiting    HPI Comments: [name] is a 65 y.o. male with prostrate cancer and a history of cholecystectomy, who presents to the Emergency Department complaining of constant, moderate, tight, RLQ abdominal pain that intermittently radiates to his back and started 1 week ago. He states decreased appetite and nausea as associated symptoms. Pt tried Omeprazole with worsening of his symptoms. He also notes that the pain became worse with eating. Pt has a history of similar symptoms that occurred several years ago and was diagnosed as kidney stones. He has a catheter stent for urinary retention secondary to prostate cancer. Pt denies fever, vomiting, diarrhea, hematuria and dysuria as associated symptoms.    Past Medical History  Diagnosis Date  . Hypertension   . Prostate cancer   . GERD (gastroesophageal reflux disease)   . History of TB (tuberculosis)     1984--  hospitalized for 4 month treatment  . Post-polio syndrome     polio at age 85--PT WAS IN IRON LUNG; PT WAS IN W/C UNTIL AGE 58; STILL HAS WEAKNESS RIGHT SIDE  . History of chronic bronchitis   . History of rheumatic  fever   . COPD, frequent exacerbations     pulmologist-  dr Joya Gaskins--  Gold Stage C  . Bladder outlet obstruction   . Anxiety disorder   . History of urinary retention   . Nocturnal oxygen desaturation     USES O2 NIGHTLY  . Prostate cancer   . Complication of anesthesia     DIFFICULT WAKING   . Diabetes mellitus without complication     BODERLINE - DIET CONTROL  . Anxiety   . Shortness of breath dyspnea     RIGHT HEMIDIAPHRAGM ELEVATION - POST POLIO SYNDROME  . Dysrhythmia     PVC'S  . Arthritis     BILATERAL SHOULDERS, ELBOWS AND HANDS AND LEFT HIP AND KNEES--HX CORTISONE SHOTS IN SHOULDERS, ELBOWS, HIP AND KNEES   Past Surgical History  Procedure Laterality Date  . Other surgical history       Muscle & bone Graft/Polio  . Cystoscopy w/ cystogram/  transrectal ultrasound prostate bx  03-22-2009  . Cardiovascular stress test  06-08-2014  dr Mare Ferrari    normal lexiscan study/  no ischemia/  not gated due to PAC's  . Ureterosopy stone extraction  2000  . Nasal septum surgery  2000  . Shoulder arthroscopy with open rotator cuff repair Bilateral 2013  &  1999    removal spurs and bursectomy  . Laparoscopic cholecystectomy  2013  . Prostate biopsy N/A 09/28/2014    Procedure: PROSTATE ULTRASOUND/BIOPSY;  Surgeon: Marshall Cork  Jeffie Pollock, MD;  Location: [facility];  Service: Urology;  Laterality: N/A;  . Transurethral resection of prostate N/A 09/28/2014    Procedure: TRANSURETHRAL RESECTION OF THE PROSTATE (TURP);  Surgeon: [name], MD;  Location: [facility];  Service: Urology;  Laterality: N/A;   Family History  Problem Relation Age of Onset  . Alzheimer's disease Father 4    Deceased  . Stomach cancer Father   . Heart attack Father   . Heart disease Father   . Skin cancer Mother     Facial-Living  . Alcohol abuse Sister     x2  . Mental illness Sister     x2  . Diabetes Maternal Aunt     x2  . Thyroid disease Maternal Aunt     x4  . Diabetes Maternal Uncle   . Tuberculosis  Paternal Grandfather   . Tuberculosis Paternal Grandmother   . Alzheimer's disease Paternal Aunt   . Alzheimer's disease Paternal Uncle    History  Substance Use Topics  . Smoking status: Former Smoker -- 73 years    Quit date: 08/25/2013  . Smokeless tobacco: Never Used     Comment: hx  smoke a pack of pipe tobacco(1.5oz) every 10 days.   . Alcohol Use: 12.6 oz/week    21 Cans of beer per week     Comment: average 3 beers per day    Review of Systems  Constitutional: Positive for appetite change. Negative for fever.  Gastrointestinal: Positive for nausea and abdominal pain. Negative for vomiting and diarrhea.  Genitourinary: Positive for flank pain. Negative for dysuria and hematuria.  All other systems reviewed and are negative.     Allergies  Ivp dye; Levaquin; Penicillins; Aspirin; Morphine and related; and Nsaids  Home Medications   Prior to Admission medications   Medication Sig Start Date End Date Taking? Authorizing Provider  albuterol (PROVENTIL HFA;VENTOLIN HFA) 108 (90 BASE) MCG/ACT inhaler Inhale 2 puffs into the lungs every 6 (six) hours as needed for wheezing or shortness of breath. 01/17/15   Brunetta Jeans, PA-C  ALPRAZolam [name]) 1 MG tablet Take 0.5 tablets (0.5 mg total) by mouth 3 (three) times daily as needed for anxiety. 01/11/15   Brunetta Jeans, PA-C  amLODipine (NORVASC) 10 MG tablet TAKE 1 TABLET (10 MG TOTAL) BY MOUTH DAILY. 10/29/14   Brunetta Jeans, PA-C  baclofen (LIORESAL) 10 MG tablet TAKE 1 TABLET (10 MG TOTAL) BY MOUTH 2 (TWO) TIMES DAILY. 10/29/14   Brunetta Jeans, PA-C  BREO ELLIPTA 100-25 MCG/INH AEPB 1 puff. daily 09/06/14   Historical Provider, MD  colchicine 0.6 MG tablet Take 2 tablets by mouth.  Take another tablet in 1 hour.  Can repeat in 3 days. 01/18/15   Brunetta Jeans, PA-C  doxycycline (VIBRAMYCIN) 100 MG capsule Take 1 capsule (100 mg total) by mouth 2 (two) times daily. Patient not taking: Reported on 01/18/2015 01/11/15    Brunetta Jeans, PA-C  fluticasone [facility]) 50 MCG/ACT nasal spray Place 2 sprays into both nostrils daily. 11/22/14   Brunetta Jeans, PA-C  levalbuterol [facility] HFA) 45 MCG/ACT inhaler Inhale 2 puffs into the lungs every 6 (six) hours as needed for wheezing. 11/22/14   Brunetta Jeans, PA-C  Linaclotide [facility]) 145 MCG CAPS capsule Take 1 capsule (145 mcg total) by mouth daily. 11/22/14   Brunetta Jeans, PA-C  loratadine (CLARITIN) 10 MG tablet Take 10 mg by mouth daily as needed for allergies.  Historical Provider, MD  metoprolol succinate (TOPROL-XL) 25 MG 24 hr tablet TAKE 1 TABLET BY MOUTH ONCE DAILY 01/26/15   Brunetta Jeans, PA-C  montelukast (SINGULAIR) 10 MG tablet Take 1 tablet (10 mg total) by mouth at bedtime. 10/27/14   Elsie Stain, MD  omeprazole (PRILOSEC) 20 MG capsule Take 1 capsule (20 mg total) by mouth 2 (two) times daily before a meal. 01/11/15   Brunetta Jeans, PA-C  oxyCODONE-acetaminophen (PERCOCET/ROXICET) 5-325 MG per tablet Take 1-2 tablets by mouth every 6 (six) hours as needed for severe pain. 10/31/14   Alfonzo Beers, MD  promethazine (PHENERGAN) 12.5 MG tablet Take 1 tablet (12.5 mg total) by mouth every 8 (eight) hours as needed for nausea or vomiting. 07/13/14   Brunetta Jeans, PA-C  tiotropium (SPIRIVA) 18 MCG inhalation capsule Place 18 mcg into inhaler and inhale daily. IN AM    Historical Provider, MD  zolpidem (AMBIEN) 5 MG tablet Take 1 tablet (5 mg total) by mouth at bedtime as needed for sleep. 10/06/14   Brunetta Jeans, PA-C   BP 135/64 mmHg  Pulse 66  Temp(Src) 98.9 F (37.2 C) (Oral)  Resp 20  Ht 6\' 1"  (1.854 m)  Wt 217 lb (98.431 kg)  BMI 28.64 kg/m2  SpO2 100% Physical Exam  Constitutional: He is oriented to person, place, and time. He appears well-developed and well-nourished. No distress.  HENT:  Head: Normocephalic and atraumatic.  Mouth/Throat: Oropharynx is clear and moist. No oropharyngeal exudate.  Eyes: EOM are normal.  Pupils are equal, round, and reactive to light.  Neck: Normal range of motion. Neck supple.  Cardiovascular: Normal rate and regular rhythm.  Exam reveals no friction rub.   No murmur heard. Pulmonary/Chest: Effort normal and breath sounds normal. No respiratory distress. He has no wheezes. He has no rales.  Abdominal: Soft. He exhibits no distension. There is tenderness (RLQ. No RUQ pain. No CVA tenderness). There is no rebound.  Musculoskeletal: Normal range of motion. He exhibits no edema.  Neurological: He is alert and oriented to person, place, and time. No cranial nerve deficit. He exhibits normal muscle tone.  Skin: No rash noted. He is not diaphoretic.  Nursing note and vitals reviewed.   ED Course  Procedures   DIAGNOSTIC STUDIES: Oxygen Saturation is 100% on RA, normal by my interpretation.    COORDINATION OF CARE: 9:42 PM Discussed treatment plan with pt  and pt agreed to plan.  Labs Review Labs Reviewed  URINALYSIS, ROUTINE W REFLEX MICROSCOPIC - Abnormal; Notable for the following:    Ketones, ur 15 (*)    Leukocytes, UA SMALL (*)    All other components within normal limits  URINE MICROSCOPIC-ADD ON    Imaging Review Ct Abdomen Pelvis Wo Contrast  02/03/2015   CLINICAL DATA:  Right lower quadrant abdominal pain. History of prostate cancer.  EXAM: CT ABDOMEN AND PELVIS WITHOUT CONTRAST  TECHNIQUE: Multidetector CT imaging of the abdomen and pelvis was performed following the standard protocol without IV contrast.  COMPARISON:  10/31/2014, 02/23/2013  FINDINGS: Linear opacity within the lingula. Normal heart size. Trace pericardial fluid.  Intra-abdominal organ evaluation limited without intravenous contrast. Within this limitation, hepatic steatosis. Cholecystectomy. No biliary ductal dilatation. No appreciable abnormality of the spleen, pancreas, adrenal glands.  Mild perinephric fat stranding is similar to prior. No urinary tract calculi. No hydroureteronephrosis.  Cystic density along the distal left ureter just proximal to the UVJ, unchanged and may reflect a ureterocele or bladder  diverticulum.  No overt colitis. Normal appendix. Small bowel loops are of normal course and caliber. No free intraperitoneal air or fluid. No lymphadenopathy.  Normal caliber abdominal aorta and branch vessels with mild scattered atherosclerotic disease.  Thin walled bladder. Possible TURP defect. Prostate gland measures 4.4 cm transverse diameter. Cannot assess for residual tumor by noncontrast CT. Small fat containing left inguinal hernia and/or spermatic cord lipoma.  Multilevel degenerative changes of the visualized spine. No acute osseous finding. Sequelae of prior posterior right twelfth rib fracture.  IMPRESSION: No acute abdominopelvic process identified by unenhanced CT. Normal appendix. No hydronephrosis.  Mild perinephric fat stranding is similar to priors and may reflect sequelae of prior infection or inflammation. Correlate with urinalysis to exclude an acute ascending infection.  Hepatic steatosis.   Electronically Signed   By: Carlos Levering M.D.   On: 02/03/2015 23:51     EKG Interpretation None      MDM   Final diagnoses:  UTI (lower urinary tract infection)  Right upper quadrant pain    64 yo male with abdominal pain. Began several days ago. He starts his right upper quadrant, hurts in his right flank. No vomiting or diarrhea. Feels like a prior kidney stone. Vitals stable here. On exam has right lower quadrant pain on exam. We'll scan look for possible appendicitis. CT normal except for straining around the right kidney. He does have 3-6 whites. We'll treat his UTI, given Bactrim. Has follow-up in the next 1-2 days with his doctor. Stable for discharge.  I personally performed the services described in this documentation, which was scribed in my presence. The recorded information has been reviewed and is accurate.     Evelina Bucy, MD 02/04/15 [phone]

## 2015-02-04 ENCOUNTER — Telehealth: Payer: Self-pay | Admitting: *Deleted

## 2015-02-04 NOTE — Telephone Encounter (Signed)
Patient called stating blood pressure was high 140's over 90s.  Patient very anxious on the phone.  He reports that he has been urinating frequently, more than usual.  He also reports that his mouth is dry.  Notified patient that he was diagnosed with a UTI yesterday in ED and that frequent urination is a side-effect, and because he is urinating frequently he should increase fluid intake (and limit salt for BP).   He stated understanding.  He has not started taking his antibiotic, recommended patient go pick up his antibiotic so that he can clear the infection and alleviate symptoms.  Gave patient number to call if any more concerns arise.  Patient stated understanding.

## 2015-02-04 NOTE — ED Notes (Signed)
Patient left prior to giving medications

## 2015-02-07 ENCOUNTER — Ambulatory Visit (INDEPENDENT_AMBULATORY_CARE_PROVIDER_SITE_OTHER): Payer: Medicare Other | Admitting: Physician Assistant

## 2015-02-07 ENCOUNTER — Encounter: Payer: Self-pay | Admitting: Physician Assistant

## 2015-02-07 VITALS — BP 115/55 | HR 57 | Temp 98.3°F | Resp 18 | Ht 73.0 in | Wt 214.5 lb

## 2015-02-07 DIAGNOSIS — R399 Unspecified symptoms and signs involving the genitourinary system: Secondary | ICD-10-CM | POA: Diagnosis not present

## 2015-02-07 DIAGNOSIS — F1020 Alcohol dependence, uncomplicated: Secondary | ICD-10-CM

## 2015-02-07 DIAGNOSIS — K219 Gastro-esophageal reflux disease without esophagitis: Secondary | ICD-10-CM | POA: Diagnosis not present

## 2015-02-07 LAB — POCT URINALYSIS DIPSTICK
BILIRUBIN UA: NEGATIVE
Glucose, UA: NEGATIVE
Ketones, UA: NEGATIVE
Nitrite, UA: NEGATIVE
PH UA: 6
Protein, UA: NEGATIVE
RBC UA: NEGATIVE
Spec Grav, UA: 1.025
UROBILINOGEN UA: 0.2

## 2015-02-07 MED ORDER — PANTOPRAZOLE SODIUM 40 MG PO TBEC: 40.0000 mg | DELAYED_RELEASE_TABLET | Freq: Every day | ORAL | Status: DC

## 2015-02-07 NOTE — Progress Notes (Signed)
Pre visit review using our clinic review tool, if applicable. No additional management support is needed unless otherwise documented below in the visit note/SLS  

## 2015-02-07 NOTE — Patient Instructions (Signed)
Please start the Protonix as directed instead of the omeprazole.  Continue other medications as directed. Finish antibiotic but stay well hydrated.   Call or return to clinic if symptoms do not continue to resolve.

## 2015-02-07 NOTE — Progress Notes (Signed)
Patient presents to clinic today for ER follow-up of UTI and RUQ pain. Patient was seen in ER on 02/03/15 for UTI symptoms.  UA + for LE. Negative for other findings.  CT shown evidence of old renal inflammation. BMP within normal limits.  Patient started on Bactrim.  Has been taking as directed with good relief.  Denies fever, chills, nausea. Denies hematuria or dysuria.  Endorses good urinary output. Of note, CT also revealed hepatic steatosis. Patient has drinking history of 12 beers per 1-2 nights. Liver enzymes just mildly elevated on LFT panel. Patient endorses he had decided to quit drinking so has not had a drink in 1 week.  Past Medical History  Diagnosis Date  . Hypertension   . Prostate cancer   . GERD (gastroesophageal reflux disease)   . History of TB (tuberculosis)     1984--  hospitalized for 4 month treatment  . Post-polio syndrome     polio at age 31--PT WAS IN IRON LUNG; PT WAS IN W/C UNTIL AGE 54; STILL HAS WEAKNESS RIGHT SIDE  . History of chronic bronchitis   . History of rheumatic fever   . COPD, frequent exacerbations     pulmologist-  dr Joya Gaskins--  Gold Stage C  . Bladder outlet obstruction   . Anxiety disorder   . History of urinary retention   . Nocturnal oxygen desaturation     USES O2 NIGHTLY  . Prostate cancer   . Complication of anesthesia     DIFFICULT WAKING   . Diabetes mellitus without complication     BODERLINE - DIET CONTROL  . Anxiety   . Shortness of breath dyspnea     RIGHT HEMIDIAPHRAGM ELEVATION - POST POLIO SYNDROME  . Dysrhythmia     PVC'S  . Arthritis     BILATERAL SHOULDERS, ELBOWS AND HANDS AND LEFT HIP AND KNEES--HX CORTISONE SHOTS IN SHOULDERS, ELBOWS, HIP AND KNEES    Current Outpatient Prescriptions on File Prior to Visit  Medication Sig Dispense Refill  . albuterol (PROVENTIL HFA;VENTOLIN HFA) 108 (90 BASE) MCG/ACT inhaler Inhale 2 puffs into the lungs every 6 (six) hours as needed for wheezing or shortness of breath. 1 Inhaler 3   . ALPRAZolam (XANAX) 1 MG tablet Take 0.5 tablets (0.5 mg total) by mouth 3 (three) times daily as needed for anxiety. 90 tablet 0  . amLODipine (NORVASC) 10 MG tablet TAKE 1 TABLET (10 MG TOTAL) BY MOUTH DAILY. 90 tablet 0  . baclofen (LIORESAL) 10 MG tablet TAKE 1 TABLET (10 MG TOTAL) BY MOUTH 2 (TWO) TIMES DAILY. 60 tablet 2  . BREO ELLIPTA 100-25 MCG/INH AEPB 1 puff. daily  1  . colchicine 0.6 MG tablet Take 2 tablets by mouth.  Take another tablet in 1 hour.  Can repeat in 3 days. 6 tablet 3  . fluticasone (FLONASE) 50 MCG/ACT nasal spray Place 2 sprays into both nostrils daily. 16 g 12  . levalbuterol (XOPENEX HFA) 45 MCG/ACT inhaler Inhale 2 puffs into the lungs every 6 (six) hours as needed for wheezing. 1 Inhaler 12  . loratadine (CLARITIN) 10 MG tablet Take 10 mg by mouth daily as needed for allergies.    . metoprolol succinate (TOPROL-XL) 25 MG 24 hr tablet TAKE 1 TABLET BY MOUTH ONCE DAILY 30 tablet 3  . montelukast (SINGULAIR) 10 MG tablet Take 1 tablet (10 mg total) by mouth at bedtime. 30 tablet 5  . oxyCODONE-acetaminophen (PERCOCET/ROXICET) 5-325 MG per tablet Take 1-2 tablets by mouth every  6 (six) hours as needed for severe pain. 15 tablet 0  . promethazine (PHENERGAN) 12.5 MG tablet Take 1 tablet (12.5 mg total) by mouth every 8 (eight) hours as needed for nausea or vomiting. 30 tablet 0  . sulfamethoxazole-trimethoprim (BACTRIM DS,SEPTRA DS) 800-160 MG per tablet Take 1 tablet by mouth 2 (two) times daily. 14 tablet 0  . tiotropium (SPIRIVA) 18 MCG inhalation capsule Place 18 mcg into inhaler and inhale daily. IN AM    . zolpidem (AMBIEN) 5 MG tablet Take 1 tablet (5 mg total) by mouth at bedtime as needed for sleep. 15 tablet 1  . Linaclotide (LINZESS) 145 MCG CAPS capsule Take 1 capsule (145 mcg total) by mouth daily. (Patient not taking: Reported on 02/07/2015) 30 capsule 1   No current facility-administered medications on file prior to visit.    Allergies  Allergen  Reactions  . Ivp Dye [Iodinated Diagnostic Agents] Anaphylaxis  . Levaquin [Levofloxacin In D5w] Shortness Of Breath    In addition: sweating, chest pain, and diarrhea.   . Penicillins Anaphylaxis    Heart stops  . Aspirin Other (See Comments)    Reaction unknown  . Morphine And Related Nausea And Vomiting  . Nsaids Other (See Comments)    Difficulty breathing    Family History  Problem Relation Age of Onset  . Alzheimer's disease Father 4    Deceased  . Stomach cancer Father   . Heart attack Father   . Heart disease Father   . Skin cancer Mother     Facial-Living  . Alcohol abuse Sister     x2  . Mental illness Sister     x2  . Diabetes Maternal Aunt     x2  . Thyroid disease Maternal Aunt     x4  . Diabetes Maternal Uncle   . Tuberculosis Paternal Grandfather   . Tuberculosis Paternal Grandmother   . Alzheimer's disease Paternal Aunt   . Alzheimer's disease Paternal Uncle     History   Social History  . Marital Status: Married    Spouse Name: N/A  . Number of Children: N/A  . Years of Education: N/A   Social History Main Topics  . Smoking status: Former Smoker -- 70 years    Quit date: 08/25/2013  . Smokeless tobacco: Never Used     Comment: hx  smoke a pack of pipe tobacco(1.5oz) every 10 days.   . Alcohol Use: 12.6 oz/week    21 Cans of beer per week     Comment: average 3 beers per day  . Drug Use: No  . Sexual Activity: Not on file   Other Topics Concern  . None   Social History Narrative   Review of Systems - See HPI.  All other ROS are negative.  BP 115/55 mmHg  Pulse 57  Temp(Src) 98.3 F (36.8 C) (Oral)  Resp 18  Ht _0  (1.854 m)  Wt 214 lb 8 oz (97.297 kg)  BMI 28.31 kg/m2  SpO2 99%  Physical Exam  Constitutional: He is oriented to person, place, and time and well-developed, well-nourished, and in no distress.  HENT:  Head: Normocephalic and atraumatic.  Cardiovascular: Normal rate, regular rhythm, normal heart sounds and  intact distal pulses.   Pulmonary/Chest: Effort normal and breath sounds normal. No respiratory distress. He has no wheezes. He has no rales. He exhibits no tenderness.  Abdominal: Soft. Bowel sounds are normal. He exhibits no distension and no mass. There is no tenderness. There  is no rebound and no guarding.  Neurological: He is alert and oriented to person, place, and time.  Skin: Skin is warm and dry. No rash noted.  Psychiatric: Affect normal.  Vitals reviewed.   Recent Results (from the past 2160 hour(s))  CBC with Differential/Platelet     Status: Abnormal   Collection Time: 11/14/14  6:25 AM  Result Value Ref Range   WBC 7.1 4.0 - 10.5 K/uL   RBC 4.67 4.22 - 5.81 MIL/uL   Hemoglobin 14.7 13.0 - 17.0 g/dL   HCT 44.0 39.0 - 52.0 %   MCV 94.2 78.0 - 100.0 fL   MCH 31.5 26.0 - 34.0 pg   MCHC 33.4 30.0 - 36.0 g/dL   RDW 13.0 11.5 - 15.5 %   Platelets 260 150 - 400 K/uL   Neutrophils Relative % 58 43 - 77 %   Neutro Abs 4.2 1.7 - 7.7 K/uL   Lymphocytes Relative 28 12 - 46 %   Lymphs Abs 2.0 0.7 - 4.0 K/uL   Monocytes Relative 7 3 - 12 %   Monocytes Absolute 0.5 0.1 - 1.0 K/uL   Eosinophils Relative 6 (H) 0 - 5 %   Eosinophils Absolute 0.5 0.0 - 0.7 K/uL   Basophils Relative 1 0 - 1 %   Basophils Absolute 0.0 0.0 - 0.1 K/uL  Basic metabolic panel     Status: Abnormal   Collection Time: 11/14/14  6:25 AM  Result Value Ref Range   Sodium 136 135 - 145 mmol/L   Potassium 4.2 3.5 - 5.1 mmol/L   Chloride 106 96 - 112 mmol/L   CO2 26 19 - 32 mmol/L   Glucose, Bld 122 (H) 70 - 99 mg/dL   BUN 16 6 - 23 mg/dL   Creatinine, Ser 1.16 0.50 - 1.35 mg/dL   Calcium 9.2 8.4 - 10.5 mg/dL   GFR calc non Af Amer 64 (L) >90 mL/min   GFR calc Af Amer 75 (L) >90 mL/min    Comment: (NOTE) The eGFR has been calculated using the CKD EPI equation. This calculation has not been validated in all clinical situations. eGFR's persistently <90 mL/min signify possible Chronic Kidney Disease.     Anion gap 4 (L) 5 - 15  Troponin I     Status: None   Collection Time: 11/14/14  6:26 AM  Result Value Ref Range   Troponin I <0.03 <0.031 ng/mL    Comment:        NO INDICATION OF MYOCARDIAL INJURY.   Troponin I     Status: None   Collection Time: 11/14/14  9:10 AM  Result Value Ref Range   Troponin I <0.03 <0.031 ng/mL    Comment:        NO INDICATION OF MYOCARDIAL INJURY.   Troponin I     Status: None   Collection Time: 11/14/14 11:09 AM  Result Value Ref Range   Troponin I <0.03 <0.031 ng/mL    Comment:        NO INDICATION OF MYOCARDIAL INJURY.   CBC with Differential     Status: Abnormal   Collection Time: 12/21/14  2:35 AM  Result Value Ref Range   WBC 6.1 4.0 - 10.5 K/uL   RBC 4.58 4.22 - 5.81 MIL/uL   Hemoglobin 14.5 13.0 - 17.0 g/dL   HCT 43.3 39.0 - 52.0 %   MCV 94.5 78.0 - 100.0 fL   MCH 31.7 26.0 - 34.0 pg   MCHC 33.5 30.0 -  36.0 g/dL   RDW 13.5 11.5 - 15.5 %   Platelets 256 150 - 400 K/uL   Neutrophils Relative % 60 43 - 77 %   Neutro Abs 3.7 1.7 - 7.7 K/uL   Lymphocytes Relative 26 12 - 46 %   Lymphs Abs 1.6 0.7 - 4.0 K/uL   Monocytes Relative 8 3 - 12 %   Monocytes Absolute 0.5 0.1 - 1.0 K/uL   Eosinophils Relative 7 (H) 0 - 5 %   Eosinophils Absolute 0.4 0.0 - 0.7 K/uL   Basophils Relative 1 0 - 1 %   Basophils Absolute 0.0 0.0 - 0.1 K/uL  Basic metabolic panel     Status: Abnormal   Collection Time: 12/21/14  2:35 AM  Result Value Ref Range   Sodium 135 135 - 145 mmol/L   Potassium 3.9 3.5 - 5.1 mmol/L   Chloride 98 96 - 112 mmol/L   CO2 26 19 - 32 mmol/L   Glucose, Bld 121 (H) 70 - 99 mg/dL   BUN 15 6 - 23 mg/dL   Creatinine, Ser 1.19 0.50 - 1.35 mg/dL   Calcium 9.2 8.4 - 10.5 mg/dL   GFR calc non Af Amer 62 (L) >90 mL/min   GFR calc Af Amer 72 (L) >90 mL/min    Comment: (NOTE) The eGFR has been calculated using the CKD EPI equation. This calculation has not been validated in all clinical situations. eGFR's persistently <90 mL/min  signify possible Chronic Kidney Disease.    Anion gap 11 5 - 15  CBC     Status: None   Collection Time: 01/09/15 11:20 AM  Result Value Ref Range   WBC 7.5 4.0 - 10.5 K/uL   RBC 4.51 4.22 - 5.81 MIL/uL   Hemoglobin 14.2 13.0 - 17.0 g/dL   HCT 42.5 39.0 - 52.0 %   MCV 94.2 78.0 - 100.0 fL   MCH 31.5 26.0 - 34.0 pg   MCHC 33.4 30.0 - 36.0 g/dL   RDW 13.6 11.5 - 15.5 %   Platelets 199 150 - 400 K/uL  Basic metabolic panel     Status: Abnormal   Collection Time: 01/09/15 11:20 AM  Result Value Ref Range   Sodium 138 135 - 145 mmol/L   Potassium 4.1 3.5 - 5.1 mmol/L   Chloride 102 96 - 112 mmol/L   CO2 28 19 - 32 mmol/L   Glucose, Bld 134 (H) 70 - 99 mg/dL   BUN 10 6 - 23 mg/dL   Creatinine, Ser 1.16 0.50 - 1.35 mg/dL   Calcium 8.8 8.4 - 10.5 mg/dL   GFR calc non Af Amer 64 (L) >90 mL/min   GFR calc Af Amer 75 (L) >90 mL/min    Comment: (NOTE) The eGFR has been calculated using the CKD EPI equation. This calculation has not been validated in all clinical situations. eGFR's persistently <90 mL/min signify possible Chronic Kidney Disease.    Anion gap 8 5 - 15  Troponin I     Status: None   Collection Time: 01/09/15 12:24 PM  Result Value Ref Range   Troponin I <0.03 <0.031 ng/mL    Comment:        NO INDICATION OF MYOCARDIAL INJURY.   Urinalysis, Routine w reflex microscopic     Status: Abnormal   Collection Time: 02/03/15  7:40 PM  Result Value Ref Range   Color, Urine YELLOW YELLOW   APPearance CLEAR CLEAR   Specific Gravity, Urine 1.011 1.005 - 1.030  pH 5.0 5.0 - 8.0   Glucose, UA NEGATIVE NEGATIVE mg/dL   Hgb urine dipstick NEGATIVE NEGATIVE   Bilirubin Urine NEGATIVE NEGATIVE   Ketones, ur 15 (A) NEGATIVE mg/dL   Protein, ur NEGATIVE NEGATIVE mg/dL   Urobilinogen, UA 0.2 0.0 - 1.0 mg/dL   Nitrite NEGATIVE NEGATIVE   Leukocytes, UA SMALL (A) NEGATIVE  Urine microscopic-add on     Status: None   Collection Time: 02/03/15  7:40 PM  Result Value Ref Range    Squamous Epithelial / LPF RARE RARE   WBC, UA 3-6 <3 WBC/hpf   Bacteria, UA RARE RARE  Comprehensive metabolic panel     Status: Abnormal   Collection Time: 02/03/15 10:05 PM  Result Value Ref Range   Sodium 134 (L) 135 - 145 mmol/L   Potassium 3.7 3.5 - 5.1 mmol/L   Chloride 102 96 - 112 mmol/L   CO2 23 19 - 32 mmol/L   Glucose, Bld 104 (H) 70 - 99 mg/dL   BUN 12 6 - 23 mg/dL   Creatinine, Ser 1.06 0.50 - 1.35 mg/dL   Calcium 9.2 8.4 - 10.5 mg/dL   Total Protein 7.8 6.0 - 8.3 g/dL   Albumin 4.5 3.5 - 5.2 g/dL   AST 51 (H) 0 - 37 U/L   ALT 64 (H) 0 - 53 U/L   Alkaline Phosphatase 59 39 - 117 U/L   Total Bilirubin 0.8 0.3 - 1.2 mg/dL   GFR calc non Af Amer 72 (L) >90 mL/min   GFR calc Af Amer 83 (L) >90 mL/min    Comment: (NOTE) The eGFR has been calculated using the CKD EPI equation. This calculation has not been validated in all clinical situations. eGFR's persistently <90 mL/min signify possible Chronic Kidney Disease.    Anion gap 9 5 - 15  CBC with Differential     Status: Abnormal   Collection Time: 02/03/15 10:05 PM  Result Value Ref Range   WBC 5.3 4.0 - 10.5 K/uL   RBC 4.36 4.22 - 5.81 MIL/uL   Hemoglobin 14.1 13.0 - 17.0 g/dL   HCT 40.8 39.0 - 52.0 %   MCV 93.6 78.0 - 100.0 fL   MCH 32.3 26.0 - 34.0 pg   MCHC 34.6 30.0 - 36.0 g/dL   RDW 13.5 11.5 - 15.5 %   Platelets 243 150 - 400 K/uL   Neutrophils Relative % 57 43 - 77 %   Neutro Abs 3.0 1.7 - 7.7 K/uL   Lymphocytes Relative 29 12 - 46 %   Lymphs Abs 1.6 0.7 - 4.0 K/uL   Monocytes Relative 8 3 - 12 %   Monocytes Absolute 0.4 0.1 - 1.0 K/uL   Eosinophils Relative 6 (H) 0 - 5 %   Eosinophils Absolute 0.3 0.0 - 0.7 K/uL   Basophils Relative 0 0 - 1 %   Basophils Absolute 0.0 0.0 - 0.1 K/uL  POCT urinalysis dipstick     Status: None   Collection Time: 02/07/15 12:05 PM  Result Value Ref Range   Color, UA Yellow    Clarity, UA cloudy    Glucose, UA neg    Bilirubin, UA neg    Ketones, UA neg    Spec  Grav, UA 1.025    Blood, UA neg    pH, UA 6.0    Protein, UA neg    Urobilinogen, UA 0.2    Nitrite, UA neg    Leukocytes, UA small (1+)     Assessment/Plan:  GERD (gastroesophageal reflux disease) Will switch to a Protonix tablet daily. GERD symptoms will likely improve now that patient has ceased alcohol consumption. GERD diet discussed.  Follow-up 1 month.   Alcoholism Patient > 1 week out from weaning completely off of alcohol.  No withdrawal symptoms.  Discussed need for a program but patient declines.  Encouraged patient to reconsider.   UTI symptoms Resolved.  No culture performed in ER. Patient unable to give sample at time of visit.  Encouraged him to bring sample in for a culture at earliest convenience.  Finish total course of antibiotic.

## 2015-02-08 ENCOUNTER — Encounter (HOSPITAL_COMMUNITY): Payer: Medicare Other

## 2015-02-08 DIAGNOSIS — R399 Unspecified symptoms and signs involving the genitourinary system: Secondary | ICD-10-CM | POA: Insufficient documentation

## 2015-02-08 NOTE — Assessment & Plan Note (Signed)
Will switch to a Protonix tablet daily. GERD symptoms will likely improve now that patient has ceased alcohol consumption. GERD diet discussed.  Follow-up 1 month.

## 2015-02-08 NOTE — Assessment & Plan Note (Signed)
Resolved.  No culture performed in ER. Patient unable to give sample at time of visit.  Encouraged him to bring sample in for a culture at earliest convenience.  Finish total course of antibiotic.

## 2015-02-08 NOTE — Assessment & Plan Note (Signed)
Patient > 1 week out from weaning completely off of alcohol.  No withdrawal symptoms.  Discussed need for a program but patient declines.  Encouraged patient to reconsider.

## 2015-02-10 ENCOUNTER — Encounter (HOSPITAL_COMMUNITY): Payer: Medicare Other

## 2015-02-11 ENCOUNTER — Telehealth: Payer: Self-pay | Admitting: *Deleted

## 2015-02-11 NOTE — Telephone Encounter (Signed)
Received O2 order via fax from [facility]. Form completed and forwarded to [facility] for review/signature. JG//CMA

## 2015-02-14 ENCOUNTER — Telehealth: Payer: Self-pay | Admitting: Physician Assistant

## 2015-02-14 DIAGNOSIS — R1310 Dysphagia, unspecified: Secondary | ICD-10-CM

## 2015-02-14 DIAGNOSIS — L821 Other seborrheic keratosis: Secondary | ICD-10-CM | POA: Diagnosis not present

## 2015-02-14 NOTE — Telephone Encounter (Signed)
Pt made aware and voiced understanding. Oxygen machine has been delivered. Thank you

## 2015-02-14 NOTE — Telephone Encounter (Signed)
A referral to GI has been placed for assessment and EGD.  Will you also let him know that I have placed order for oxygen with [facility] so they will be bringing out new machine.

## 2015-02-14 NOTE — Telephone Encounter (Signed)
Signed forms faxed. Sent for scanning. JG//CMA

## 2015-02-14 NOTE — Telephone Encounter (Signed)
Relation to pt: self  Call back number: best [phone] Pharmacy:  Reason for call: pt stated he thought about having the endoscopic and would like to have procedure done based on PA discussion.

## 2015-02-15 ENCOUNTER — Encounter (HOSPITAL_COMMUNITY): Payer: Medicare Other

## 2015-02-17 ENCOUNTER — Encounter (HOSPITAL_COMMUNITY): Payer: Medicare Other

## 2015-02-17 ENCOUNTER — Encounter: Payer: Self-pay | Admitting: Physician Assistant

## 2015-02-22 ENCOUNTER — Encounter (HOSPITAL_COMMUNITY): Payer: Medicare Other

## 2015-02-24 ENCOUNTER — Encounter (HOSPITAL_COMMUNITY): Payer: Medicare Other

## 2015-03-01 ENCOUNTER — Encounter (HOSPITAL_COMMUNITY): Payer: Medicare Other

## 2015-03-01 ENCOUNTER — Other Ambulatory Visit: Payer: Self-pay | Admitting: Physician Assistant

## 2015-03-01 DIAGNOSIS — D1801 Hemangioma of skin and subcutaneous tissue: Secondary | ICD-10-CM | POA: Diagnosis not present

## 2015-03-01 DIAGNOSIS — L821 Other seborrheic keratosis: Secondary | ICD-10-CM | POA: Diagnosis not present

## 2015-03-01 DIAGNOSIS — L814 Other melanin hyperpigmentation: Secondary | ICD-10-CM | POA: Diagnosis not present

## 2015-03-01 DIAGNOSIS — Z808 Family history of malignant neoplasm of other organs or systems: Secondary | ICD-10-CM | POA: Diagnosis not present

## 2015-03-01 DIAGNOSIS — L918 Other hypertrophic disorders of the skin: Secondary | ICD-10-CM | POA: Diagnosis not present

## 2015-03-03 ENCOUNTER — Encounter (HOSPITAL_COMMUNITY): Payer: Medicare Other

## 2015-03-03 ENCOUNTER — Telehealth: Payer: Self-pay | Admitting: Physician Assistant

## 2015-03-03 MED ORDER — ALBUTEROL SULFATE HFA 108 (90 BASE) MCG/ACT IN AERS: 2.0000 | INHALATION_SPRAY | Freq: Four times a day (QID) | RESPIRATORY_TRACT | Status: DC | PRN

## 2015-03-03 NOTE — Telephone Encounter (Signed)
Relation to pt: self   Call back number: [phone] Pharmacy: [id number] - [place], [place].  Reason for call:  Pt states as per insurance requesting a 2 or 3 month supply  PROVENTIL HFA;VENTOLIN HFA) 108 (90 BASE) MCG/ACT inhaler due to the expense being cheaper rather then a 1 month supply. Pt is down to 8 puffs. Please advise

## 2015-03-03 NOTE — Telephone Encounter (Signed)
Rx sent in to pharmacy. 

## 2015-03-07 ENCOUNTER — Ambulatory Visit (INDEPENDENT_AMBULATORY_CARE_PROVIDER_SITE_OTHER): Payer: Medicare Other | Admitting: Physician Assistant

## 2015-03-07 ENCOUNTER — Telehealth: Payer: Self-pay | Admitting: Physician Assistant

## 2015-03-07 ENCOUNTER — Encounter: Payer: Self-pay | Admitting: Physician Assistant

## 2015-03-07 VITALS — BP 120/70 | HR 64 | Ht 73.0 in | Wt 214.6 lb

## 2015-03-07 DIAGNOSIS — R12 Heartburn: Secondary | ICD-10-CM

## 2015-03-07 DIAGNOSIS — K219 Gastro-esophageal reflux disease without esophagitis: Secondary | ICD-10-CM

## 2015-03-07 DIAGNOSIS — R1314 Dysphagia, pharyngoesophageal phase: Secondary | ICD-10-CM | POA: Diagnosis not present

## 2015-03-07 DIAGNOSIS — K5909 Other constipation: Secondary | ICD-10-CM

## 2015-03-07 DIAGNOSIS — K5900 Constipation, unspecified: Secondary | ICD-10-CM | POA: Diagnosis not present

## 2015-03-07 MED ORDER — LANSOPRAZOLE 30 MG PO TBDP: ORAL_TABLET | ORAL | Status: DC

## 2015-03-07 NOTE — Progress Notes (Addendum)
Patient ID: OLNEY MONIER, male   DOB: March 31, 1951, 64 y.o.   MRN: [id number]   Subjective:    Patient ID: BENJAMINE STROUT, male    DOB: 1951-08-31, 64 y.o.   MRN: [id number]  HPI Yakov is a 64 year old white male new to GI, referred today by [facility] Raiford Noble PA-C for evaluation of dysphagia. Patient says that he is been having symptoms for 4-6 months with pills hanging every time he swallows them. He says sometimes he refluxes them back up and doesn't feel like they ever make it to his stomach. He has been having frequent episodes of regurgitation. He also has solid food dysphagia on a daily basis but says this is not as bad as the pill dysphagia. His appetite has been fine and weight has been stable he has no complaints of abdominal pain. He says he has been having "bad" heartburn on a daily basis and has been drinking apple cider vinegar several times a day which she says burns initially and then seems to help his symptoms short-term. He says that he had a prior EGD by a Dr. Mariea Clonts in [facility] maybe 15 years ago and did require an esophageal dilation. He also feels that he had a colonoscopy done somewhere in [place] but does not recall who did this. He says it was several years ago. Has history of daily EtOH use. Also with COPD now oxygen dependent at bedtime, hypertension, and history of prostate cancer for which he had surgery in October 2015. CT scan had been done in April 2016 showing hepatic steatosis.  Also with chronic constipation, multiple days between BM's -uses enemas as needed  Review of Systems Pertinent positive and negative review of systems were noted in the above HPI section.  All other review of systems was otherwise negative.  Outpatient Encounter Prescriptions as of 03/07/2015  Medication Sig  . albuterol (PROVENTIL HFA;VENTOLIN HFA) 108 (90 BASE) MCG/ACT inhaler Inhale 2 puffs into the lungs every 6 (six) hours as needed for wheezing or shortness of  breath.  . ALPRAZolam (XANAX) 1 MG tablet Take 0.5 tablets (0.5 mg total) by mouth 3 (three) times daily as needed for anxiety.  Marland Kitchen amLODipine (NORVASC) 10 MG tablet TAKE 1 TABLET BY MOUTH ONCE DAILY  . baclofen (LIORESAL) 10 MG tablet TAKE 1 TABLET (10 MG TOTAL) BY MOUTH 2 (TWO) TIMES DAILY.  Marland Kitchen BREO ELLIPTA 100-25 MCG/INH AEPB 1 puff. daily  . colchicine 0.6 MG tablet Take 2 tablets by mouth.  Take another tablet in 1 hour.  Can repeat in 3 days.  Marland Kitchen loratadine (CLARITIN) 10 MG tablet Take 10 mg by mouth daily as needed for allergies.  . metoprolol succinate (TOPROL-XL) 25 MG 24 hr tablet TAKE 1 TABLET BY MOUTH ONCE DAILY  . montelukast (SINGULAIR) 10 MG tablet Take 1 tablet (10 mg total) by mouth at bedtime.  Marland Kitchen oxyCODONE-acetaminophen (PERCOCET/ROXICET) 5-325 MG per tablet Take 1-2 tablets by mouth every 6 (six) hours as needed for severe pain.  . promethazine (PHENERGAN) 12.5 MG tablet Take 1 tablet (12.5 mg total) by mouth every 8 (eight) hours as needed for nausea or vomiting.  . tiotropium (SPIRIVA) 18 MCG inhalation capsule Place 18 mcg into inhaler and inhale daily. IN AM  . lansoprazole (PREVACID SOLUTAB) 30 MG disintegrating tablet Take 1 tab in the morning daily.  . [DISCONTINUED] fluticasone (FLONASE) 50 MCG/ACT nasal spray Place 2 sprays into both nostrils daily.  . [DISCONTINUED] levalbuterol (XOPENEX HFA) 45 MCG/ACT  inhaler Inhale 2 puffs into the lungs every 6 (six) hours as needed for wheezing.  . [DISCONTINUED] Linaclotide (LINZESS) 145 MCG CAPS capsule Take 1 capsule (145 mcg total) by mouth daily. (Patient not taking: Reported on 02/07/2015)  . [DISCONTINUED] pantoprazole (PROTONIX) 40 MG tablet Take 1 tablet (40 mg total) by mouth daily.  . [DISCONTINUED] sulfamethoxazole-trimethoprim (BACTRIM DS,SEPTRA DS) 800-160 MG per tablet Take 1 tablet by mouth 2 (two) times daily.  . [DISCONTINUED] zolpidem (AMBIEN) 5 MG tablet Take 1 tablet (5 mg total) by mouth at bedtime as needed for  sleep.   No facility-administered encounter medications on file as of 03/07/2015.   Allergies  Allergen Reactions  . Ivp Dye [Iodinated Diagnostic Agents] Anaphylaxis  . Levaquin [Levofloxacin In D5w] Shortness Of Breath    In addition: sweating, chest pain, and diarrhea.   . Penicillins Anaphylaxis    Heart stops  . Aspirin Other (See Comments)    Reaction unknown  . Morphine And Related Nausea And Vomiting  . Nsaids Other (See Comments)    Difficulty breathing   Patient Active Problem List   Diagnosis Date Noted  . UTI symptoms 02/08/2015  . Gross hematuria 10/19/2014  . Arthritis of both knees 10/17/2014  . Joint stiffness of multiple sites 10/17/2014  . Benign localized hyperplasia of prostate with urinary obstruction 09/29/2014  . COPD exacerbation 09/18/2014  . Memory loss 09/13/2014  . Chest pain 08/18/2014  . Polyuria 08/15/2014  . Right knee pain 07/27/2014  . UTI (lower urinary tract infection) 07/14/2014  . Tuberculosis   . Left knee pain 06/30/2014  . Diverticulum of bladder 06/17/2014  . Chest pain on breathing 05/26/2014  . Neuropathy 04/11/2014  . Concussion with no loss of consciousness 04/11/2014  . Chronic knee pain 02/22/2014  . Post-polio syndrome 01/13/2014  . COPD (chronic obstructive pulmonary disease) Gold C Frequent exacerbations 01/13/2014  . Hypertension 01/13/2014  . GERD (gastroesophageal reflux disease) 01/13/2014  . Former smoker 01/13/2014  . Prostate cancer 01/13/2014  . History of tuberculosis 01/13/2014  . Bipolar disorder 01/13/2014  . Explosive personality disorder 01/13/2014  . Erectile dysfunction 01/13/2014  . Nephrolithiasis 01/13/2014  . Alcoholism 01/13/2014  . OSA (obstructive sleep apnea) 01/13/2014   History   Social History  . Marital Status: Married    Spouse Name: N/A  . Number of Children: N/A  . Years of Education: N/A   Occupational History  . Retired    Social History Main Topics  . Smoking status:  Former Smoker -- 48 years    Quit date: 08/25/2013  . Smokeless tobacco: Never Used     Comment: hx  smoke a pack of pipe tobacco(1.5oz) every 10 days.   . Alcohol Use: 12.6 oz/week    21 Cans of beer per week     Comment: average 3 beers per day  . Drug Use: No  . Sexual Activity: Not on file   Other Topics Concern  . Not on file   Social History Narrative    Mr. Marlett family history includes Alcohol abuse in his sister; Alzheimer's disease in his paternal aunt and paternal uncle; Alzheimer's disease (age of onset: 33) in his father; Diabetes in his maternal aunt and maternal uncle; Heart attack in his father; Heart disease in his father; Mental illness in his sister; Skin cancer in his mother; Stomach cancer in his father; Thyroid disease in his maternal aunt; Tuberculosis in his paternal grandfather and paternal grandmother.      Objective:  Filed Vitals:   03/07/15 0831  BP: 120/70  Pulse: 64    Physical Exam well-developed older white male in no acute distress blood pressure 120/70 pulse 64 height 6 foot 1 weight 214. HEENT; nontraumatic normocephalic EOMI PERRLA sclera anicteric, Supple ;no JVD, Cardiovascular; regular rate and rhythm with S1-S2 no murmur or gallop, Pulmonary; somewhat decreased breath sounds bilaterally, Abdomen ;soft nontender nondistended bowel sounds are active there is no palpable mass or hepatosplenomegaly, Rectal ;exam not done, EXt; no clubbing cyanosis or edema skin warm and dry, Psych; mood and affect appropriate       Assessment & Plan:   #1  63 yo male with several month hx of solid food dysphagia, and daily heartburn   Symptoms are consistent with chronic GERD and probable recurrent esophageal stricture  #2 COPD- O2 dependent at night #3 HTN #4Bipolar disorder #5 Hx ETOH abuse #6hx of prostate CA #7 OSA  Plan; Start  Prevacid solutab 30 mg daily in am Schedule for EGD with prob dilation  with Dr. Henrene Pastor  At  [facility] due to oxygen use  procedure discussed in detail wit pt and he is agreeable to proceed. Anti-reflux regimen discussed Pt will sign release for records from prior colonoscopy- he does not wish to schedule colonoscopy at this time    Addendum- records received from Dr Karilyn Cota office- EGD 2008- small HH,,mild stricture- [place] dilated #54. Colonoscopy 07/2007- normal (reports to be scanned)  Amy Genia Harold PA-C 03/07/2015   Cc: Brunetta Jeans, PA-C

## 2015-03-07 NOTE — Progress Notes (Signed)
Agree with initial assessment and plans as outlined. Case discussed with physician assistant

## 2015-03-07 NOTE — Patient Instructions (Signed)
You have been scheduled for an endoscopy. Please follow written instructions given to you at your visit today. If you use inhalers (even only as needed), please bring them with you on the day of your procedure.  We sent a prescription for Prevacid Solutab to [facility] [address]

## 2015-03-07 NOTE — Telephone Encounter (Signed)
Relation to [id number]  Call back number: [phone]   Reason for call:  Pt inquiring about why albuterol (PROVENTIL HFA;VENTOLIN HFA) 108 (90 BASE) MCG/ACT inhaler was changed to pro air

## 2015-03-08 ENCOUNTER — Encounter (HOSPITAL_COMMUNITY): Payer: Medicare Other

## 2015-03-09 ENCOUNTER — Telehealth: Payer: Self-pay | Admitting: Internal Medicine

## 2015-03-09 DIAGNOSIS — C61 Malignant neoplasm of prostate: Secondary | ICD-10-CM | POA: Diagnosis not present

## 2015-03-09 MED ORDER — ALBUTEROL SULFATE HFA 108 (90 BASE) MCG/ACT IN AERS: 2.0000 | INHALATION_SPRAY | Freq: Four times a day (QID) | RESPIRATORY_TRACT | Status: DC | PRN

## 2015-03-09 NOTE — Telephone Encounter (Signed)
Rx sent to the pharmacy by e-script.//AB/CMA 

## 2015-03-10 ENCOUNTER — Encounter (HOSPITAL_COMMUNITY): Payer: Medicare Other

## 2015-03-15 ENCOUNTER — Ambulatory Visit: Payer: Self-pay | Admitting: Family Medicine

## 2015-03-15 NOTE — Telephone Encounter (Signed)
Spoke to the patient. He doesn't want to pay the $600.00 his ins company wont cover the [employer].  He said Dr. Hassell Done did call in Pantoprazole sodium 40 mg and like all the other PPI's, this also makes his throat feel strange, like something is stuck there.  He said he drinks a small amount of apple cider vinegar daily which seems to help.  He said he can manage taking the pantoprazole sodium 40 mg every 2 days or so. He said that seems to help.  He is not eating after 8 PM night. He also is elevating his head for sleep.  His EGD is scheduled at [facility] with Dr Henrene Pastor for 03-22-2015.

## 2015-03-16 ENCOUNTER — Encounter (HOSPITAL_COMMUNITY): Payer: Self-pay | Admitting: *Deleted

## 2015-03-16 ENCOUNTER — Telehealth: Payer: Self-pay | Admitting: Physician Assistant

## 2015-03-16 DIAGNOSIS — C61 Malignant neoplasm of prostate: Secondary | ICD-10-CM | POA: Diagnosis not present

## 2015-03-16 DIAGNOSIS — N401 Enlarged prostate with lower urinary tract symptoms: Secondary | ICD-10-CM | POA: Diagnosis not present

## 2015-03-16 DIAGNOSIS — R351 Nocturia: Secondary | ICD-10-CM | POA: Diagnosis not present

## 2015-03-16 DIAGNOSIS — N138 Other obstructive and reflux uropathy: Secondary | ICD-10-CM | POA: Diagnosis not present

## 2015-03-16 NOTE — Telephone Encounter (Signed)
That fine -he can just use protonix 40 mg for now until se what EGD shows

## 2015-03-16 NOTE — Telephone Encounter (Signed)
Relation to pt: self  Call back number: [phone] Pharmacy:  Reason for call:   Pt went to pre admitting at [facility] and was advised by the admitting nurse that he's diabetic. Pt states he is unaware in need of clarification. Please advise

## 2015-03-17 ENCOUNTER — Telehealth: Payer: Self-pay | Admitting: Internal Medicine

## 2015-03-17 ENCOUNTER — Encounter: Payer: Self-pay | Admitting: Adult Health

## 2015-03-17 ENCOUNTER — Ambulatory Visit (INDEPENDENT_AMBULATORY_CARE_PROVIDER_SITE_OTHER): Payer: Medicare Other | Admitting: Adult Health

## 2015-03-17 VITALS — BP 118/75 | HR 66 | Temp 97.9°F | Ht 72.0 in | Wt 211.0 lb

## 2015-03-17 DIAGNOSIS — J441 Chronic obstructive pulmonary disease with (acute) exacerbation: Secondary | ICD-10-CM | POA: Diagnosis not present

## 2015-03-17 MED ORDER — DOXYCYCLINE HYCLATE 100 MG PO TABS: 100.0000 mg | ORAL_TABLET | Freq: Two times a day (BID) | ORAL | Status: DC

## 2015-03-17 MED ORDER — PREDNISONE 10 MG PO TABS: ORAL_TABLET | ORAL | Status: DC

## 2015-03-17 NOTE — Assessment & Plan Note (Signed)
Flare with URI   Plan  Doxycycline 100mg  Twice daily  For 7 days -take with food Sunscreen while on antibiotic as may cause sun burn .  Mucinex DM Twice daily As needed  Cough/congestion  Fluids and rest  Prednisone taper over next week.  Please contact office for sooner follow up if symptoms do not improve or worsen or seek emergency care  Follow up  In 3 months and As needed    May need to postpone endo as bronchitis on abx and steroids

## 2015-03-17 NOTE — Telephone Encounter (Signed)
Pt was seen by pulmonary today and placed on prednisone and doxycycline for bronchitis. Spoke with Dr. Henrene Pastor and pt is to call back on Monday with an update to see if ok for EGD on Tuesday. Pt verbalized understanding.

## 2015-03-17 NOTE — Patient Instructions (Signed)
Doxycycline 100mg  Twice daily  For 7 days -take with food Sunscreen while on antibiotic as may cause sun burn .  Mucinex DM Twice daily As needed  Cough/congestion  Fluids and rest  Prednisone taper over next week.  Please contact office for sooner follow up if symptoms do not improve or worsen or seek emergency care  Follow up  In 3 months and As needed

## 2015-03-17 NOTE — Progress Notes (Signed)
   Subjective:    Patient ID: [name], male    DOB: July 13, 1951, 64 y.o.   MRN: [id number]  HPI 64 yo former smoker with COPD   03/17/2015 Acute OV  Pt presents for an acute office visit.  Complains of cough, congestion with thick mucus and wheezing for last 1 week.  Started left over prednisone 10mg  for last couple of days.  No chest pain,orthopnea, edema , hemoptysis, or fever.  Does have endo /colon planned next week We discussed may need to reschedule if not improved with abx.   Last cxr 12/2014 with COPD changes.  PVX and Prevnar utd   Says he taking BREO and Spiriva on reg basis .     Review of Systems  Constitutional:   No  weight loss, night sweats,  Fevers, chills, fatigue, or  lassitude.  HEENT:   No headaches,  Difficulty swallowing,  Tooth/dental problems, or  Sore throat,                No sneezing, itching, ear ache, +nasal congestion, post nasal drip,   CV:  No chest pain,  Orthopnea, PND, swelling in lower extremities, anasarca, dizziness, palpitations, syncope.   GI  No heartburn, indigestion, abdominal pain, nausea, vomiting, diarrhea, change in bowel habits, loss of appetite, bloody stools.   Resp: .  No chest wall deformity  Skin: no rash or lesions.  GU: no dysuria, change in color of urine, no urgency or frequency.  No flank pain, no hematuria   MS:  No joint pain or swelling.  No decreased range of motion.  No back pain.  Psych:  No change in mood or affect. No depression or anxiety.  No memory loss.          Objective:   Physical Exam GEN: A/Ox3; pleasant , NAD ,   HEENT:  [place]/AT,  EACs-clear, TMs-wnl, NOSE-clear, THROAT-clear, no lesions, no postnasal drip or exudate noted.   NECK:  Supple w/ fair ROM; no JVD; normal carotid impulses w/o bruits; no thyromegaly or nodules palpated; no lymphadenopathy.  RESP  Decreased BS , no wheezing .no accessory muscle use, no dullness to percussion  CARD:  RRR, no m/r/g  , no peripheral edema,  pulses intact, no cyanosis or clubbing.  GI:   Soft & nt; nml bowel sounds; no organomegaly or masses detected.  Musco: Warm bil, no deformities or joint swelling noted.   Neuro: alert, no focal deficits noted.    Skin: Warm, no lesions or rashes         Assessment & Plan:

## 2015-03-18 ENCOUNTER — Telehealth: Payer: Self-pay | Admitting: Physician Assistant

## 2015-03-18 ENCOUNTER — Ambulatory Visit: Payer: Self-pay | Admitting: Family Medicine

## 2015-03-18 NOTE — Telephone Encounter (Signed)
[facility] Re[facility] with [facility] Fax # [phone]  We referred pt to their office. They request office visit notes for 01/11/15 and 02/07/15 regarding oxygen that was ordered for patient. They needs notes to establish need for service. Please fax to [facility].

## 2015-03-21 ENCOUNTER — Ambulatory Visit (INDEPENDENT_AMBULATORY_CARE_PROVIDER_SITE_OTHER): Payer: Medicare Other | Admitting: Physician Assistant

## 2015-03-21 ENCOUNTER — Encounter: Payer: Self-pay | Admitting: Physician Assistant

## 2015-03-21 VITALS — BP 119/54 | HR 64 | Temp 98.3°F | Ht 72.0 in | Wt 212.8 lb

## 2015-03-21 DIAGNOSIS — R739 Hyperglycemia, unspecified: Secondary | ICD-10-CM | POA: Diagnosis not present

## 2015-03-21 NOTE — Progress Notes (Signed)
Pre visit review using our clinic review tool, if applicable. No additional management support is needed unless otherwise documented below in the visit note. 

## 2015-03-21 NOTE — Telephone Encounter (Signed)
Called and spoke with the pt on (03-18-15) and informed him per Einar Pheasant that his HgbA1c was 6.0 back in Oct and he will need to come in for an office visit to discuss this.  Pt agreed and appt was scheduled.//AB/CMA

## 2015-03-21 NOTE — Telephone Encounter (Signed)
Called and spoke with the pt on (03-18-15) and informed him that we received a call from [facility] and they are requesting office notes regarding his oxygen that was ordered for him.    Per Einar Pheasant- informed the pt that we will need for him to come in for an office visit to discuss this.  Pt agreed and appt was scheduled.//AB/CMA

## 2015-03-22 ENCOUNTER — Telehealth: Payer: Self-pay

## 2015-03-22 ENCOUNTER — Encounter (HOSPITAL_COMMUNITY)
Admission: RE | Disposition: A | Discharge status: Home / Self Care | Payer: Self-pay | Source: Ambulatory Visit | Attending: Internal Medicine

## 2015-03-22 ENCOUNTER — Ambulatory Visit (HOSPITAL_COMMUNITY): Payer: Medicare Other | Admitting: Registered Nurse

## 2015-03-22 ENCOUNTER — Encounter (HOSPITAL_COMMUNITY): Payer: Self-pay | Admitting: *Deleted

## 2015-03-22 ENCOUNTER — Ambulatory Visit (HOSPITAL_COMMUNITY): Payer: Medicare Other

## 2015-03-22 ENCOUNTER — Ambulatory Visit (HOSPITAL_COMMUNITY)
Admission: RE | Admit: 2015-03-22 | Discharge: 2015-03-22 | Disposition: A | Discharge status: Home / Self Care | Payer: Medicare Other | Source: Ambulatory Visit | Attending: Internal Medicine | Admitting: Internal Medicine

## 2015-03-22 DIAGNOSIS — E119 Type 2 diabetes mellitus without complications: Secondary | ICD-10-CM | POA: Diagnosis not present

## 2015-03-22 DIAGNOSIS — E669 Obesity, unspecified: Secondary | ICD-10-CM | POA: Diagnosis not present

## 2015-03-22 DIAGNOSIS — I10 Essential (primary) hypertension: Secondary | ICD-10-CM | POA: Diagnosis not present

## 2015-03-22 DIAGNOSIS — J449 Chronic obstructive pulmonary disease, unspecified: Secondary | ICD-10-CM | POA: Insufficient documentation

## 2015-03-22 DIAGNOSIS — K222 Esophageal obstruction: Secondary | ICD-10-CM | POA: Diagnosis not present

## 2015-03-22 DIAGNOSIS — M1990 Unspecified osteoarthritis, unspecified site: Secondary | ICD-10-CM | POA: Diagnosis not present

## 2015-03-22 DIAGNOSIS — R1314 Dysphagia, pharyngoesophageal phase: Secondary | ICD-10-CM | POA: Diagnosis not present

## 2015-03-22 DIAGNOSIS — K219 Gastro-esophageal reflux disease without esophagitis: Secondary | ICD-10-CM | POA: Diagnosis not present

## 2015-03-22 DIAGNOSIS — Z6828 Body mass index (BMI) 28.0-28.9, adult: Secondary | ICD-10-CM | POA: Insufficient documentation

## 2015-03-22 DIAGNOSIS — Z87891 Personal history of nicotine dependence: Secondary | ICD-10-CM | POA: Diagnosis not present

## 2015-03-22 DIAGNOSIS — R12 Heartburn: Secondary | ICD-10-CM

## 2015-03-22 DIAGNOSIS — Z9981 Dependence on supplemental oxygen: Secondary | ICD-10-CM | POA: Diagnosis not present

## 2015-03-22 DIAGNOSIS — Z79899 Other long term (current) drug therapy: Secondary | ICD-10-CM | POA: Diagnosis not present

## 2015-03-22 DIAGNOSIS — K5909 Other constipation: Secondary | ICD-10-CM

## 2015-03-22 DIAGNOSIS — G4730 Sleep apnea, unspecified: Secondary | ICD-10-CM | POA: Diagnosis not present

## 2015-03-22 DIAGNOSIS — Z9989 Dependence on other enabling machines and devices: Secondary | ICD-10-CM | POA: Diagnosis not present

## 2015-03-22 DIAGNOSIS — F319 Bipolar disorder, unspecified: Secondary | ICD-10-CM | POA: Insufficient documentation

## 2015-03-22 DIAGNOSIS — R1310 Dysphagia, unspecified: Secondary | ICD-10-CM | POA: Diagnosis not present

## 2015-03-22 HISTORY — PX: SAVORY DILATION: SHX5439

## 2015-03-22 HISTORY — DX: Dependence on supplemental oxygen: Z99.81

## 2015-03-22 HISTORY — PX: ESOPHAGOGASTRODUODENOSCOPY: SHX5428

## 2015-03-22 LAB — HEMOGLOBIN A1C: Hgb A1c MFr Bld: 5.8 % (ref 4.6–6.5)

## 2015-03-22 SURGERY — EGD (ESOPHAGOGASTRODUODENOSCOPY)
Anesthesia: Monitor Anesthesia Care

## 2015-03-22 MED ORDER — PROPOFOL 10 MG/ML IV BOLUS
INTRAVENOUS | Status: AC
  Filled 2015-03-22: qty 20

## 2015-03-22 MED ORDER — LACTATED RINGERS IV SOLN
INTRAVENOUS | Status: DC
  Administered 2015-03-22: 1000 mL via INTRAVENOUS
  Administered 2015-03-22: 09:00:00 via INTRAVENOUS

## 2015-03-22 MED ORDER — LIDOCAINE HCL (CARDIAC) 20 MG/ML IV SOLN
INTRAVENOUS | Status: AC
  Filled 2015-03-22: qty 5

## 2015-03-22 MED ORDER — GLYCOPYRROLATE 0.2 MG/ML IJ SOLN
INTRAMUSCULAR | Status: DC | PRN
  Administered 2015-03-22: 0.2 mg via INTRAVENOUS

## 2015-03-22 MED ORDER — PROMETHAZINE HCL 25 MG/ML IJ SOLN: 6.2500 mg | INTRAMUSCULAR | Status: DC | PRN

## 2015-03-22 MED ORDER — SODIUM CHLORIDE 0.9 % IV SOLN: INTRAVENOUS | Status: DC

## 2015-03-22 MED ORDER — GLYCOPYRROLATE 0.2 MG/ML IJ SOLN
INTRAMUSCULAR | Status: AC
  Filled 2015-03-22: qty 1

## 2015-03-22 MED ORDER — LIDOCAINE HCL (CARDIAC) 20 MG/ML IV SOLN
INTRAVENOUS | Status: DC | PRN
  Administered 2015-03-22: 100 mg via INTRAVENOUS

## 2015-03-22 MED ORDER — OMEPRAZOLE 20 MG PO CPDR: 20.0000 mg | DELAYED_RELEASE_CAPSULE | Freq: Every day | ORAL | Status: DC

## 2015-03-22 MED ORDER — MEPERIDINE HCL 100 MG/ML IJ SOLN: 6.2500 mg | INTRAMUSCULAR | Status: DC | PRN

## 2015-03-22 MED ORDER — LACTATED RINGERS IV SOLN: INTRAVENOUS | Status: DC

## 2015-03-22 MED ORDER — PROPOFOL INFUSION 10 MG/ML OPTIME
INTRAVENOUS | Status: DC | PRN
  Administered 2015-03-22: 300 ug/kg/min via INTRAVENOUS

## 2015-03-22 NOTE — Anesthesia Preprocedure Evaluation (Signed)
Anesthesia Evaluation  Patient identified by MRN, date of birth, ID band Patient awake    Reviewed: Allergy & Precautions, H&P , NPO status , Patient's Chart, lab work & pertinent test results  History of Anesthesia Complications (+) history of anesthetic complications  Airway Mallampati: II  TM Distance: >3 FB Neck ROM: Full    Dental no notable dental hx. (+) Dental Advisory Given, Poor Dentition, Partial Upper   Pulmonary shortness of breath, sleep apnea and Continuous Positive Airway Pressure Ventilation , COPD COPD inhaler, former smoker,  Elevated hemidiaphragm Cleared for surgery by pulmonologist, no recent exacerbation, reports breathing as normal, 2L home O2 at night breath sounds clear to auscultation  Pulmonary exam normal       Cardiovascular hypertension, Pt. on medications and Pt. on home beta blockers Normal cardiovascular exam+ dysrhythmias Rhythm:Regular Rate:Normal  Echo 08/2014 Study Conclusions  - Left ventricle: The cavity size was normal. There was mildconcentric hypertrophy. Systolic function was normal. Theestimated ejection fraction was in the range of 55% to 60%. Wallmotion was normal; there were no regional wall motionabnormalities.   Neuro/Psych PSYCHIATRIC DISORDERS Anxiety Bipolar Disorder negative neurological ROS     GI/Hepatic Neg liver ROS, GERD-  Medicated and Controlled,  Endo/Other  diabetes, Type 2, Oral Hypoglycemic AgentsObesity   Renal/GU Renal diseasenegative Renal ROS  negative genitourinary   Musculoskeletal negative musculoskeletal ROS (+) Arthritis -,   Abdominal (+) + obese,   Peds negative pediatric ROS (+)  Hematology negative hematology ROS (+)   Anesthesia Other Findings   Reproductive/Obstetrics negative OB ROS                             Anesthesia Physical  Anesthesia Plan  ASA: III  Anesthesia Plan: MAC   Post-op Pain  Management:    Induction:   Airway Management Planned: Nasal Cannula  Additional Equipment:   Intra-op Plan:   Post-operative Plan:   Informed Consent: I have reviewed the patients History and Physical, chart, labs and discussed the procedure including the risks, benefits and alternatives for the proposed anesthesia with the patient or authorized representative who has indicated his/her understanding and acceptance.   Dental advisory given  Plan Discussed with: CRNA  Anesthesia Plan Comments:         Anesthesia Quick Evaluation

## 2015-03-22 NOTE — Anesthesia Postprocedure Evaluation (Signed)
  Anesthesia Post-op Note  Patient: [name]  Procedure(s) Performed: Procedure(s) (LRB): ESOPHAGOGASTRODUODENOSCOPY (EGD) with dilation (N/A) SAVORY DILATION (N/A)  Patient Location: PACU  Anesthesia Type: MAC  Level of Consciousness: awake and alert   Airway and Oxygen Therapy: Patient Spontanous Breathing  Post-op Pain: mild  Post-op Assessment: Post-op Vital signs reviewed, Patient's Cardiovascular Status Stable, Respiratory Function Stable, Patent Airway and No signs of Nausea or vomiting  Last Vitals:  Filed Vitals:   03/22/15 0955  BP:   Pulse:   Temp:   Resp: 12    Post-op Vital Signs: stable   Complications: No apparent anesthesia complications

## 2015-03-22 NOTE — Discharge Instructions (Signed)
Esophagogastroduodenoscopy °Care After °Refer to this sheet in the next few weeks. These instructions provide you with information on caring for yourself after your procedure. Your caregiver may also give you more specific instructions. Your treatment has been planned according to current medical practices, but problems sometimes occur. Call your caregiver if you have any problems or questions after your procedure.  °HOME CARE INSTRUCTIONS °· Do not eat or drink anything until the numbing medicine (local anesthetic) has worn off and your gag reflex has returned. You will know that the local anesthetic has worn off when you can swallow comfortably. °· Do not drive for 12 hours after the procedure or as directed by your caregiver. °· Only take medicines as directed by your caregiver. °SEEK MEDICAL CARE IF:  °· You cannot stop coughing. °· You are not urinating at all or less than usual. °SEEK IMMEDIATE MEDICAL CARE IF: °· You have difficulty swallowing. °· You cannot eat or drink. °· You have worsening throat or chest pain. °· You have dizziness, lightheadedness, or you faint. °· You have nausea or vomiting. °· You have chills. °· You have a fever. °· You have severe abdominal pain. °· You have black, tarry, or bloody stools. °Document Released: 09/17/2012 Document Reviewed: 09/17/2012 °ExitCare® Patient Information ©2015 [employer]. This information is not intended to replace advice given to you by your health care provider. Make sure you discuss any questions you have with your health care provider. ° ° °Conscious Sedation, Adult, Care After °Refer to this sheet in the next few weeks. These instructions provide you with information on caring for yourself after your procedure. Your health care provider may also give you more specific instructions. Your treatment has been planned according to current medical practices, but problems sometimes occur. Call your health care provider if you have any problems or  questions after your procedure. °WHAT TO EXPECT AFTER THE PROCEDURE  °After your procedure: °· You may feel sleepy, clumsy, and have poor balance for several hours. °· Vomiting may occur if you eat too soon after the procedure. °HOME CARE INSTRUCTIONS °· Do not participate in any activities where you could become injured for at least 24 hours. Do not: °¨ Drive. °¨ Swim. °¨ Ride a bicycle. °¨ Operate heavy machinery. °¨ Cook. °¨ Use power tools. °¨ Climb ladders. °¨ Work from a high place. °· Do not make important decisions or sign legal documents until you are improved. °· If you vomit, drink water, juice, or soup when you can drink without vomiting. Make sure you have little or no nausea before eating solid foods. °· Only take over-the-counter or prescription medicines for pain, discomfort, or fever as directed by your health care provider. °· Make sure you and your family fully understand everything about the medicines given to you, including what side effects may occur. °· You should not drink alcohol, take sleeping pills, or take medicines that cause drowsiness for at least 24 hours. °· If you smoke, do not smoke without supervision. °· If you are feeling better, you may resume normal activities 24 hours after you were sedated. °· Keep all appointments with your health care provider. °SEEK MEDICAL CARE IF: °· Your skin is pale or bluish in color. °· You continue to feel nauseous or vomit. °· Your pain is getting worse and is not helped by medicine. °· You have bleeding or swelling. °· You are still sleepy or feeling clumsy after 24 hours. °SEEK IMMEDIATE MEDICAL CARE IF: °· You develop a   rash. °· You have difficulty breathing. °· You develop any type of allergic problem. °· You have a fever. °MAKE SURE YOU: °· Understand these instructions. °· Will watch your condition. °· Will get help right away if you are not doing well or get worse. °Document Released: 07/22/2013 Document Reviewed: 07/22/2013 °ExitCare®  Patient Information ©2015 [employer]. This information is not intended to replace advice given to you by your health care provider. Make sure you discuss any questions you have with your health care provider. ° °

## 2015-03-22 NOTE — Telephone Encounter (Signed)
-----   Message from [name], MD sent at 03/22/2015  9:22 AM EDT ----- Regarding: Prescription Vaughan Basta, just finished hospital procedure. Please prescribed omeprazole 20 mg daily; #30; 11 refills. Thank you.

## 2015-03-22 NOTE — H&P (View-Only) (Signed)
Agree with initial assessment and plans as outlined. Case discussed with physician assistant

## 2015-03-22 NOTE — Telephone Encounter (Signed)
Script sent to pharmacy.

## 2015-03-22 NOTE — Interval H&P Note (Signed)
History and Physical Interval Note:  03/22/2015 8:46 AM  [name]  has presented today for surgery, with the diagnosis of Gerd Heartburn Dysphagia  The various methods of treatment have been discussed with the patient and family. After consideration of risks, benefits and other options for treatment, the patient has consented to  Procedure(s): ESOPHAGOGASTRODUODENOSCOPY (EGD) with dilation (N/A) SAVORY DILATION (N/A) as a surgical intervention .  The patient's history has been reviewed, patient examined, no change in status, stable for surgery.  I have reviewed the patient's chart and labs.  Questions were answered to the patient's satisfaction.     [name]

## 2015-03-22 NOTE — Op Note (Signed)
[facility] [place], [address]   ENDOSCOPY PROCEDURE REPORT  PATIENT: [name], [name]  MR#: [id number] BIRTHDATE: 03-Jun-1951 , 47  yrs. old GENDER: male ENDOSCOPIST: Eustace Quail, MD REFERRED BY:  Raiford Noble, PA-C. PROCEDURE DATE:  03/22/2015 PROCEDURE:  EGD, diagnostic and Savary dilation of esophagus   -18 mm ASA CLASS:     Class III INDICATIONS:  dysphagia. MEDICATIONS: Monitored anesthesia care and Per Anesthesia TOPICAL ANESTHETIC: none  DESCRIPTION OF PROCEDURE: After the risks benefits and alternatives of the procedure were thoroughly explained, informed consent was obtained.  The Pentax Gastroscope [id number] endoscope was introduced through the mouth and advanced to the second portion of the duodenum , Without limitations.  The instrument was slowly withdrawn as the mucosa was fully examined.   EXAM:Esophagus revealed a benign peptic stricture measuring approximately 14 mm in diameter at the gastroesophageal junction. No significant active inflammation.  Stomach and duodenum were normal.  Retroflexed views revealed no abnormalities. THERAPY: A Savary guidewire is placed into the gastric antrum under fluoroscopic control. With the wire maintained in place an 18 mm Savary dilator was passed over the guidewire. This was monitored on fluoroscopy. No resistance or heme. Tolerated well.  COMPLICATIONS: There were no immediate complications.  ENDOSCOPIC IMPRESSION: 1. GERD with peptic stricture status post dilation to 18 mm  RECOMMENDATIONS: 1. Clear liquids until  11 AM, then soft foods rest of day.  Resume prior diet tomorrow. 2. Chew food well 3. Prescribe omeprazole 20 mg daily; #30; 11 refills. This will help your heartburn 4. Return to the care of Mr. Hassell Done. GI follow-up as needed  REPEAT EXAM:  eSigned:  Eustace Quail, MD 03/22/2015 9:19 AM    CC:The Patient and Raiford Noble, PA-C

## 2015-03-22 NOTE — Transfer of Care (Signed)
Immediate Anesthesia Transfer of Care Note  Patient: [name]  Procedure(s) Performed: Procedure(s): ESOPHAGOGASTRODUODENOSCOPY (EGD) with dilation (N/A) SAVORY DILATION (N/A)  Patient Location: PACU  Anesthesia Type:MAC  Level of Consciousness: awake, patient cooperative and responds to stimulation  Airway & Oxygen Therapy: Patient Spontanous Breathing and Patient connected to nasal cannula oxygen  Post-op Assessment: Report given to RN, Post -op Vital signs reviewed and stable and Patient moving all extremities X 4  Post vital signs: stable  Last Vitals:  Filed Vitals:   03/22/15 0922  BP: 140/67  Temp:   Resp: 18    Complications: No apparent anesthesia complications

## 2015-03-23 ENCOUNTER — Other Ambulatory Visit: Payer: Self-pay

## 2015-03-23 ENCOUNTER — Telehealth: Payer: Self-pay | Admitting: Internal Medicine

## 2015-03-23 DIAGNOSIS — Z1211 Encounter for screening for malignant neoplasm of colon: Secondary | ICD-10-CM

## 2015-03-23 NOTE — Assessment & Plan Note (Signed)
We'll obtain A1c today to further assess. Dietary and exercise measures discussed. Limit carbs. If diabetes is present, we'll treat accordingly.

## 2015-03-23 NOTE — Telephone Encounter (Signed)
Pt scheduled for previsit 04/14/15@9am . Pt scheduled for colon at [facility] 05/03/15@8 :30am. Case number [id number].

## 2015-03-23 NOTE — Telephone Encounter (Signed)
Pt had EGD yesterday and is calling to schedule a colon. States he has been having a lot of problems with constipation and would like to have a colon done. Please advise.

## 2015-03-23 NOTE — Telephone Encounter (Signed)
Colonoscopy at Hospital next time I have a designated half-day for hospital procedures

## 2015-03-23 NOTE — Progress Notes (Signed)
Patient presents to clinic today for diabetic testing.  Patient endorses he was told by a nurse at his specialist that he had "diabetes". Patient has had previous A1c values in the prediabetic range, but has never had a level indicative of frank diabetes. Patient denies polydipsia or polyuria or polyphagia. Denies neuropathic symptoms. Denies vision changes.  Past Medical History  Diagnosis Date  . Hypertension   . Prostate cancer   . GERD (gastroesophageal reflux disease)   . History of TB (tuberculosis)     1984--  hospitalized for 4 month treatment  . Post-polio syndrome     polio at age 48--PT WAS IN IRON LUNG; PT WAS IN W/C UNTIL AGE 51; STILL HAS WEAKNESS RIGHT SIDE  . History of chronic bronchitis   . History of rheumatic fever   . COPD, frequent exacerbations     pulmologist-  dr Joya Gaskins--  Gold Stage C  . Bladder outlet obstruction   . Anxiety disorder   . History of urinary retention   . Nocturnal oxygen desaturation     USES O2 NIGHTLY  . Prostate cancer   . Complication of anesthesia     DIFFICULT WAKING   . Diabetes mellitus without complication     BODERLINE - DIET CONTROL  . Anxiety   . Shortness of breath dyspnea     RIGHT HEMIDIAPHRAGM ELEVATION - POST POLIO SYNDROME  . Dysrhythmia     PVC'S  . Arthritis     BILATERAL SHOULDERS, ELBOWS AND HANDS AND LEFT HIP AND KNEES--HX CORTISONE SHOTS IN SHOULDERS, ELBOWS, HIP AND KNEES  . History of oxygen administration     oxygen use 2 l/m nasally at bedtime and exertional occasions    Current Outpatient Prescriptions on File Prior to Visit  Medication Sig Dispense Refill  . albuterol (PROVENTIL HFA;VENTOLIN HFA) 108 (90 BASE) MCG/ACT inhaler Inhale 2 puffs into the lungs every 6 (six) hours as needed for wheezing or shortness of breath. 3 Inhaler 3  . albuterol (PROVENTIL) (2.5 MG/3ML) 0.083% nebulizer solution Take 2.5 mg by nebulization daily as needed for wheezing or shortness of breath.    . ALPRAZolam (XANAX) 1  MG tablet Take 0.5 tablets (0.5 mg total) by mouth 3 (three) times daily as needed for anxiety. 90 tablet 0  . amLODipine (NORVASC) 10 MG tablet TAKE 1 TABLET BY MOUTH ONCE DAILY 90 tablet 1  . baclofen (LIORESAL) 10 MG tablet TAKE 1 TABLET (10 MG TOTAL) BY MOUTH 2 (TWO) TIMES DAILY. 60 tablet 2  . BREO ELLIPTA 100-25 MCG/INH AEPB Inhale 1 puff into the lungs every morning. daily  1  . colchicine 0.6 MG tablet Take 2 tablets by mouth.  Take another tablet in 1 hour.  Can repeat in 3 days. 6 tablet 3  . doxycycline (VIBRA-TABS) 100 MG tablet Take 1 tablet (100 mg total) by mouth 2 (two) times daily. 14 tablet 0  . fluticasone (FLONASE) 50 MCG/ACT nasal spray Place 1-2 sprays into both nostrils daily as needed for allergies or rhinitis.    Marland Kitchen lansoprazole (PREVACID SOLUTAB) 30 MG disintegrating tablet Take 1 tab in the morning daily. (Patient taking differently: 1 tablets every other day) 30 tablet 6  . loratadine (CLARITIN) 10 MG tablet Take 10 mg by mouth daily as needed for allergies.    . metoprolol succinate (TOPROL-XL) 25 MG 24 hr tablet TAKE 1 TABLET BY MOUTH ONCE DAILY 30 tablet 3  . montelukast (SINGULAIR) 10 MG tablet Take 1 tablet (10 mg  total) by mouth at bedtime. 30 tablet 5  . oxyCODONE-acetaminophen (PERCOCET/ROXICET) 5-325 MG per tablet Take 1-2 tablets by mouth every 6 (six) hours as needed for severe pain. 15 tablet 0  . OXYGEN Inhale 2 L into the lungs at bedtime.    . predniSONE (DELTASONE) 10 MG tablet 4 tabs for 2 days, then 3 tabs for 2 days, 2 tabs for 2 days, then 1 tab for 2 days, then stop 20 tablet 0  . promethazine (PHENERGAN) 12.5 MG tablet Take 1 tablet (12.5 mg total) by mouth every 8 (eight) hours as needed for nausea or vomiting. 30 tablet 0  . tiotropium (SPIRIVA) 18 MCG inhalation capsule Place 18 mcg into inhaler and inhale daily. IN AM     No current facility-administered medications on file prior to visit.    Allergies  Allergen Reactions  . Ivp Dye  [Iodinated Diagnostic Agents] Anaphylaxis  . Levaquin [Levofloxacin In D5w] Shortness Of Breath    In addition: sweating, chest pain, and diarrhea.   . Penicillins Anaphylaxis    Heart stops  . Aspirin Other (See Comments)    Reaction unknown  . Morphine And Related Nausea And Vomiting    Can take with zofran   . Nsaids Other (See Comments)    Difficulty breathing    Family History  Problem Relation Age of Onset  . Alzheimer's disease Father 4    Deceased  . Stomach cancer Father   . Heart attack Father   . Heart disease Father   . Skin cancer Mother     Facial-Living  . Alcohol abuse Sister     x2  . Mental illness Sister     x2  . Diabetes Maternal Aunt     x2  . Thyroid disease Maternal Aunt     x4  . Diabetes Maternal Uncle   . Tuberculosis Paternal Grandfather   . Tuberculosis Paternal Grandmother   . Alzheimer's disease Paternal Aunt   . Alzheimer's disease Paternal Uncle     History   Social History  . Marital Status: Married    Spouse Name: N/A  . Number of Children: N/A  . Years of Education: N/A   Occupational History  . Retired    Social History Main Topics  . Smoking status: Former Smoker -- 78 years    Quit date: 08/25/2013  . Smokeless tobacco: Never Used     Comment: hx  smoke a pack of pipe tobacco(1.5oz) every 10 days.   . Alcohol Use: 7.2 oz/week    12 Cans of beer per week     Comment: average 3 beers per day  . Drug Use: No  . Sexual Activity: Not on file   Other Topics Concern  . None   Social History Narrative    Review of Systems - See HPI.  All other ROS are negative.  BP 119/54 mmHg  Pulse 64  Temp(Src) 98.3 F (36.8 C) (Oral)  Ht 6' (1.829 m)  Wt 212 lb 12.8 oz (96.525 kg)  BMI 28.85 kg/m2  SpO2 99%  Physical Exam  Constitutional: He is oriented to person, place, and time and well-developed, well-nourished, and in no distress.  HENT:  Head: Normocephalic and atraumatic.  Eyes: Conjunctivae are normal.    Cardiovascular: Normal rate, regular rhythm, normal heart sounds and intact distal pulses.   Pulmonary/Chest: Breath sounds normal. No respiratory distress. He has no wheezes. He has no rales. He exhibits no tenderness.  Neurological: He is alert and  oriented to person, place, and time.  Vitals reviewed.   Recent Results (from the past 2160 hour(s))  CBC     Status: None   Collection Time: 01/09/15 11:20 AM  Result Value Ref Range   WBC 7.5 4.0 - 10.5 K/uL   RBC 4.51 4.22 - 5.81 MIL/uL   Hemoglobin 14.2 13.0 - 17.0 g/dL   HCT 42.5 39.0 - 52.0 %   MCV 94.2 78.0 - 100.0 fL   MCH 31.5 26.0 - 34.0 pg   MCHC 33.4 30.0 - 36.0 g/dL   RDW 13.6 11.5 - 15.5 %   Platelets 199 150 - 400 K/uL  Basic metabolic panel     Status: Abnormal   Collection Time: 01/09/15 11:20 AM  Result Value Ref Range   Sodium 138 135 - 145 mmol/L   Potassium 4.1 3.5 - 5.1 mmol/L   Chloride 102 96 - 112 mmol/L   CO2 28 19 - 32 mmol/L   Glucose, Bld 134 (H) 70 - 99 mg/dL   BUN 10 6 - 23 mg/dL   Creatinine, Ser 1.16 0.50 - 1.35 mg/dL   Calcium 8.8 8.4 - 10.5 mg/dL   GFR calc non Af Amer 64 (L) >90 mL/min   GFR calc Af Amer 75 (L) >90 mL/min    Comment: (NOTE) The eGFR has been calculated using the CKD EPI equation. This calculation has not been validated in all clinical situations. eGFR's persistently <90 mL/min signify possible Chronic Kidney Disease.    Anion gap 8 5 - 15  Troponin I     Status: None   Collection Time: 01/09/15 12:24 PM  Result Value Ref Range   Troponin I <0.03 <0.031 ng/mL    Comment:        NO INDICATION OF MYOCARDIAL INJURY.   Urinalysis, Routine w reflex microscopic     Status: Abnormal   Collection Time: 02/03/15  7:40 PM  Result Value Ref Range   Color, Urine YELLOW YELLOW   APPearance CLEAR CLEAR   Specific Gravity, Urine 1.011 1.005 - 1.030   pH 5.0 5.0 - 8.0   Glucose, UA NEGATIVE NEGATIVE mg/dL   Hgb urine dipstick NEGATIVE NEGATIVE   Bilirubin Urine NEGATIVE  NEGATIVE   Ketones, ur 15 (A) NEGATIVE mg/dL   Protein, ur NEGATIVE NEGATIVE mg/dL   Urobilinogen, UA 0.2 0.0 - 1.0 mg/dL   Nitrite NEGATIVE NEGATIVE   Leukocytes, UA SMALL (A) NEGATIVE  Urine microscopic-add on     Status: None   Collection Time: 02/03/15  7:40 PM  Result Value Ref Range   Squamous Epithelial / LPF RARE RARE   WBC, UA 3-6 <3 WBC/hpf   Bacteria, UA RARE RARE  Comprehensive metabolic panel     Status: Abnormal   Collection Time: 02/03/15 10:05 PM  Result Value Ref Range   Sodium 134 (L) 135 - 145 mmol/L   Potassium 3.7 3.5 - 5.1 mmol/L   Chloride 102 96 - 112 mmol/L   CO2 23 19 - 32 mmol/L   Glucose, Bld 104 (H) 70 - 99 mg/dL   BUN 12 6 - 23 mg/dL   Creatinine, Ser 1.06 0.50 - 1.35 mg/dL   Calcium 9.2 8.4 - 10.5 mg/dL   Total Protein 7.8 6.0 - 8.3 g/dL   Albumin 4.5 3.5 - 5.2 g/dL   AST 51 (H) 0 - 37 U/L   ALT 64 (H) 0 - 53 U/L   Alkaline Phosphatase 59 39 - 117 U/L   Total Bilirubin 0.8  0.3 - 1.2 mg/dL   GFR calc non Af Amer 72 (L) >90 mL/min   GFR calc Af Amer 83 (L) >90 mL/min    Comment: (NOTE) The eGFR has been calculated using the CKD EPI equation. This calculation has not been validated in all clinical situations. eGFR's persistently <90 mL/min signify possible Chronic Kidney Disease.    Anion gap 9 5 - 15  CBC with Differential     Status: Abnormal   Collection Time: 02/03/15 10:05 PM  Result Value Ref Range   WBC 5.3 4.0 - 10.5 K/uL   RBC 4.36 4.22 - 5.81 MIL/uL   Hemoglobin 14.1 13.0 - 17.0 g/dL   HCT 40.8 39.0 - 52.0 %   MCV 93.6 78.0 - 100.0 fL   MCH 32.3 26.0 - 34.0 pg   MCHC 34.6 30.0 - 36.0 g/dL   RDW 13.5 11.5 - 15.5 %   Platelets 243 150 - 400 K/uL   Neutrophils Relative % 57 43 - 77 %   Neutro Abs 3.0 1.7 - 7.7 K/uL   Lymphocytes Relative 29 12 - 46 %   Lymphs Abs 1.6 0.7 - 4.0 K/uL   Monocytes Relative 8 3 - 12 %   Monocytes Absolute 0.4 0.1 - 1.0 K/uL   Eosinophils Relative 6 (H) 0 - 5 %   Eosinophils Absolute 0.3 0.0 - 0.7  K/uL   Basophils Relative 0 0 - 1 %   Basophils Absolute 0.0 0.0 - 0.1 K/uL  POCT urinalysis dipstick     Status: None   Collection Time: 02/07/15 12:05 PM  Result Value Ref Range   Color, UA Yellow    Clarity, UA cloudy    Glucose, UA neg    Bilirubin, UA neg    Ketones, UA neg    Spec Grav, UA 1.025    Blood, UA neg    pH, UA 6.0    Protein, UA neg    Urobilinogen, UA 0.2    Nitrite, UA neg    Leukocytes, UA small (1+)   Hemoglobin A1c     Status: None   Collection Time: 03/21/15  4:10 PM  Result Value Ref Range   Hgb A1c MFr Bld 5.8 4.6 - 6.5 %    Comment: Glycemic Control Guidelines for People with Diabetes:Non Diabetic:  <6%Goal of Therapy: <7%Additional Action Suggested:  >8%     Assessment/Plan: Hyperglycemia We'll obtain A1c today to further assess. Dietary and exercise measures discussed. Limit carbs. If diabetes is present, we'll treat accordingly.

## 2015-03-24 ENCOUNTER — Encounter (HOSPITAL_COMMUNITY): Payer: Self-pay | Admitting: Internal Medicine

## 2015-04-21 ENCOUNTER — Ambulatory Visit (AMBULATORY_SURGERY_CENTER): Payer: Self-pay

## 2015-04-21 VITALS — Ht 72.0 in | Wt 212.8 lb

## 2015-04-21 DIAGNOSIS — K5900 Constipation, unspecified: Secondary | ICD-10-CM

## 2015-04-21 MED ORDER — SUPREP BOWEL PREP KIT 17.5-3.13-1.6 GM/177ML PO SOLN: 1.0000 | Freq: Once | ORAL | Status: DC

## 2015-04-21 NOTE — Progress Notes (Signed)
No allergies to eggs or soy No diet weight loss meds HOME OXYGEN No adverse reaction to anesthesia in the past   No email  Does not want emmi instructions

## 2015-04-25 ENCOUNTER — Telehealth: Payer: Self-pay

## 2015-04-25 ENCOUNTER — Emergency Department (HOSPITAL_BASED_OUTPATIENT_CLINIC_OR_DEPARTMENT_OTHER): Payer: Medicare Other

## 2015-04-25 ENCOUNTER — Encounter (HOSPITAL_BASED_OUTPATIENT_CLINIC_OR_DEPARTMENT_OTHER): Payer: Self-pay | Admitting: *Deleted

## 2015-04-25 ENCOUNTER — Ambulatory Visit: Payer: Medicare Other | Admitting: Physician Assistant

## 2015-04-25 ENCOUNTER — Emergency Department (HOSPITAL_BASED_OUTPATIENT_CLINIC_OR_DEPARTMENT_OTHER)
Admission: EM | Admit: 2015-04-25 | Discharge: 2015-04-26 | Disposition: A | Discharge status: Home / Self Care | Payer: Medicare Other | Attending: Emergency Medicine | Admitting: Emergency Medicine

## 2015-04-25 DIAGNOSIS — M1990 Unspecified osteoarthritis, unspecified site: Secondary | ICD-10-CM | POA: Insufficient documentation

## 2015-04-25 DIAGNOSIS — Z87448 Personal history of other diseases of urinary system: Secondary | ICD-10-CM | POA: Diagnosis not present

## 2015-04-25 DIAGNOSIS — F419 Anxiety disorder, unspecified: Secondary | ICD-10-CM | POA: Diagnosis not present

## 2015-04-25 DIAGNOSIS — Z9981 Dependence on supplemental oxygen: Secondary | ICD-10-CM | POA: Insufficient documentation

## 2015-04-25 DIAGNOSIS — Z8611 Personal history of tuberculosis: Secondary | ICD-10-CM | POA: Insufficient documentation

## 2015-04-25 DIAGNOSIS — Z880 Allergy status to penicillin: Secondary | ICD-10-CM | POA: Diagnosis not present

## 2015-04-25 DIAGNOSIS — R0602 Shortness of breath: Secondary | ICD-10-CM | POA: Diagnosis present

## 2015-04-25 DIAGNOSIS — Z79899 Other long term (current) drug therapy: Secondary | ICD-10-CM | POA: Insufficient documentation

## 2015-04-25 DIAGNOSIS — Z87891 Personal history of nicotine dependence: Secondary | ICD-10-CM | POA: Diagnosis not present

## 2015-04-25 DIAGNOSIS — K219 Gastro-esophageal reflux disease without esophagitis: Secondary | ICD-10-CM | POA: Insufficient documentation

## 2015-04-25 DIAGNOSIS — R079 Chest pain, unspecified: Secondary | ICD-10-CM | POA: Diagnosis not present

## 2015-04-25 DIAGNOSIS — Z8546 Personal history of malignant neoplasm of prostate: Secondary | ICD-10-CM | POA: Diagnosis not present

## 2015-04-25 DIAGNOSIS — E119 Type 2 diabetes mellitus without complications: Secondary | ICD-10-CM | POA: Diagnosis not present

## 2015-04-25 DIAGNOSIS — J441 Chronic obstructive pulmonary disease with (acute) exacerbation: Secondary | ICD-10-CM | POA: Insufficient documentation

## 2015-04-25 DIAGNOSIS — Z8619 Personal history of other infectious and parasitic diseases: Secondary | ICD-10-CM | POA: Diagnosis not present

## 2015-04-25 DIAGNOSIS — I10 Essential (primary) hypertension: Secondary | ICD-10-CM | POA: Diagnosis not present

## 2015-04-25 LAB — CBC WITH DIFFERENTIAL/PLATELET
BASOS ABS: 0 10*3/uL (ref 0.0–0.1)
Basophils Relative: 0 % (ref 0–1)
EOS ABS: 0.3 10*3/uL (ref 0.0–0.7)
Eosinophils Relative: 6 % — ABNORMAL HIGH (ref 0–5)
HCT: 43.2 % (ref 39.0–52.0)
Hemoglobin: 14.7 g/dL (ref 13.0–17.0)
Lymphocytes Relative: 32 % (ref 12–46)
Lymphs Abs: 1.8 10*3/uL (ref 0.7–4.0)
MCH: 32.1 pg (ref 26.0–34.0)
MCHC: 34 g/dL (ref 30.0–36.0)
MCV: 94.3 fL (ref 78.0–100.0)
MONOS PCT: 9 % (ref 3–12)
Monocytes Absolute: 0.5 10*3/uL (ref 0.1–1.0)
NEUTROS ABS: 3.1 10*3/uL (ref 1.7–7.7)
Neutrophils Relative %: 53 % (ref 43–77)
PLATELETS: 216 10*3/uL (ref 150–400)
RBC: 4.58 MIL/uL (ref 4.22–5.81)
RDW: 13.2 % (ref 11.5–15.5)
WBC: 5.8 10*3/uL (ref 4.0–10.5)

## 2015-04-25 LAB — BASIC METABOLIC PANEL
Anion gap: 10 (ref 5–15)
BUN: 18 mg/dL (ref 6–20)
CALCIUM: 9.5 mg/dL (ref 8.9–10.3)
CHLORIDE: 103 mmol/L (ref 101–111)
CO2: 26 mmol/L (ref 22–32)
Creatinine, Ser: 1.22 mg/dL (ref 0.61–1.24)
GFR calc non Af Amer: >60 mL/min (ref >60)
Glucose, Bld: 92 mg/dL (ref 65–99)
POTASSIUM: 3.6 mmol/L (ref 3.5–5.1)
Sodium: 139 mmol/L (ref 135–145)

## 2015-04-25 LAB — TROPONIN I

## 2015-04-25 MED ORDER — ONDANSETRON 4 MG PO TBDP
4.0000 mg | ORAL_TABLET | Freq: Once | ORAL | Status: AC
  Administered 2015-04-25: 4 mg via ORAL
  Filled 2015-04-25: qty 1

## 2015-04-25 MED ORDER — ALBUTEROL SULFATE (2.5 MG/3ML) 0.083% IN NEBU
5.0000 mg | INHALATION_SOLUTION | Freq: Once | RESPIRATORY_TRACT | Status: AC
  Administered 2015-04-25: 5 mg via RESPIRATORY_TRACT
  Filled 2015-04-25: qty 6

## 2015-04-25 MED ORDER — IPRATROPIUM BROMIDE 0.02 % IN SOLN
0.5000 mg | Freq: Once | RESPIRATORY_TRACT | Status: AC
  Administered 2015-04-25: 0.5 mg via RESPIRATORY_TRACT
  Filled 2015-04-25: qty 2.5

## 2015-04-25 MED ORDER — ONDANSETRON 8 MG PO TBDP: ORAL_TABLET | ORAL | Status: DC

## 2015-04-25 MED ORDER — ONDANSETRON HCL 4 MG/2ML IJ SOLN: 4.0000 mg | Freq: Once | INTRAMUSCULAR | Status: DC

## 2015-04-25 MED ORDER — FAMOTIDINE 20 MG PO TABS
20.0000 mg | ORAL_TABLET | Freq: Once | ORAL | Status: AC
  Administered 2015-04-25: 20 mg via ORAL
  Filled 2015-04-25: qty 1

## 2015-04-25 MED ORDER — METHYLPREDNISOLONE SODIUM SUCC 125 MG IJ SOLR
125.0000 mg | Freq: Once | INTRAMUSCULAR | Status: AC
  Administered 2015-04-25: 125 mg via INTRAVENOUS
  Filled 2015-04-25: qty 2

## 2015-04-25 MED ORDER — PREDNISONE 20 MG PO TABS: ORAL_TABLET | ORAL | Status: DC

## 2015-04-25 NOTE — ED Provider Notes (Signed)
CSN: [id number]     Arrival date & time 04/25/15  2052 History   This chart was scribed for Veryl Speak, MD by [name], ED Scribe. This patient was seen in room MH12/MH12 and the patient's care was started at 9:53 PM.    Chief Complaint  Patient presents with  . Shortness of Breath   Patient is a 64 y.o. male presenting with shortness of breath. The history is provided by the patient. No language interpreter was used.  Shortness of Breath Severity:  Moderate Onset quality:  Sudden Timing:  Constant Progression:  Worsening Chronicity:  New Context: activity   Relieved by:  Nothing Worsened by:  Nothing tried Ineffective treatments:  None tried Associated symptoms: no cough    HPI Comments: [name] is a 64 y.o. male former smoker with COPD, who presents to the Emergency Department complaining of SOB onset 1 day ago. He states that it feels as though he has a weight on his chest. He has not been using his oxygen for a while, and now only at nighttime. He reports using his nebulizer with some relief, and his inhaler more frequently recently. He has quit smoking 2 years ago. He noted it was exacerbated today after he worked outside. He has an irregular heartbeat. He denies associated cough.  Past Medical History  Diagnosis Date  . Hypertension   . Prostate cancer   . GERD (gastroesophageal reflux disease)   . History of TB (tuberculosis)     1984--  hospitalized for 4 month treatment  . Post-polio syndrome     polio at age 46--PT WAS IN IRON LUNG; PT WAS IN W/C UNTIL AGE 73; STILL HAS WEAKNESS RIGHT SIDE  . History of chronic bronchitis   . History of rheumatic fever   . COPD, frequent exacerbations     pulmologist-  dr Joya Gaskins--  Gold Stage C  . Bladder outlet obstruction   . Anxiety disorder   . History of urinary retention   . Nocturnal oxygen desaturation     USES O2 NIGHTLY  . Prostate cancer   . Complication of anesthesia     DIFFICULT WAKING   . Diabetes  mellitus without complication     BODERLINE - DIET CONTROL  . Anxiety   . Shortness of breath dyspnea     RIGHT HEMIDIAPHRAGM ELEVATION - POST POLIO SYNDROME  . Dysrhythmia     PVC'S  . Arthritis     BILATERAL SHOULDERS, ELBOWS AND HANDS AND LEFT HIP AND KNEES--HX CORTISONE SHOTS IN SHOULDERS, ELBOWS, HIP AND KNEES  . History of oxygen administration     oxygen use 2 l/m nasally at bedtime and exertional occasions   Past Surgical History  Procedure Laterality Date  . Other surgical history       Muscle & bone Graft/Polio  . Cystoscopy w/ cystogram/  transrectal ultrasound prostate bx  03-22-2009  . Cardiovascular stress test  06-08-2014  dr Mare Ferrari    normal lexiscan study/  no ischemia/  not gated due to PAC's  . Ureterosopy stone extraction  2000  . Nasal septum surgery  2000  . Shoulder arthroscopy with open rotator cuff repair Bilateral 2013  &  1999    removal spurs and bursectomy  . Laparoscopic cholecystectomy  2013  . Prostate biopsy N/A 09/28/2014    Procedure: PROSTATE ULTRASOUND/BIOPSY;  Surgeon: [name], MD;  Location: [facility];  Service: Urology;  Laterality: N/A;  . Transurethral resection of prostate N/A 09/28/2014  Procedure: TRANSURETHRAL RESECTION OF THE PROSTATE (TURP);  Surgeon: [name], MD;  Location: [facility];  Service: Urology;  Laterality: N/A;  . Esophagogastroduodenoscopy N/A 03/22/2015    Procedure: ESOPHAGOGASTRODUODENOSCOPY (EGD) with dilation;  Surgeon: [name], MD;  Location: [facility];  Service: Endoscopy;  Laterality: N/A;  . Savory dilation N/A 03/22/2015    Procedure: SAVORY DILATION;  Surgeon: [name], MD;  Location: [facility];  Service: Endoscopy;  Laterality: N/A;   Family History  Problem Relation Age of Onset  . Alzheimer's disease Father 4    Deceased  . Stomach cancer Father   . Heart attack Father   . Heart disease Father   . Skin cancer Mother     Facial-Living  . Alcohol abuse Sister     x2  . Mental  illness Sister     x2  . Diabetes Maternal Aunt     x2  . Thyroid disease Maternal Aunt     x4  . Diabetes Maternal Uncle   . Tuberculosis Paternal Grandfather   . Tuberculosis Paternal Grandmother   . Alzheimer's disease Paternal Aunt   . Alzheimer's disease Paternal Uncle   . Colon cancer Neg Hx   . Colon polyps Neg Hx   . Crohn's disease Neg Hx   . Ulcerative colitis Neg Hx    History  Substance Use Topics  . Smoking status: Former Smoker -- 42 years    Quit date: 08/25/2013  . Smokeless tobacco: Never Used     Comment: hx  smoke a pack of pipe tobacco(1.5oz) every 10 days.   . Alcohol Use: 7.2 oz/week    12 Cans of beer per week     Comment: average 3 beers per day    Review of Systems  Respiratory: Positive for shortness of breath. Negative for cough.   All other systems reviewed and are negative.   Allergies  Ivp dye; Levaquin; Penicillins; Aspirin; Morphine and related; and Nsaids  Home Medications   Prior to Admission medications   Medication Sig Start Date End Date Taking? Authorizing Provider  albuterol (PROVENTIL HFA;VENTOLIN HFA) 108 (90 BASE) MCG/ACT inhaler Inhale 2 puffs into the lungs every 6 (six) hours as needed for wheezing or shortness of breath. 03/09/15   Brunetta Jeans, PA-C  albuterol (PROVENTIL) (2.5 MG/3ML) 0.083% nebulizer solution Take 2.5 mg by nebulization daily as needed for wheezing or shortness of breath.    Historical Provider, MD  ALPRAZolam [name]) 1 MG tablet Take 0.5 tablets (0.5 mg total) by mouth 3 (three) times daily as needed for anxiety. 01/11/15   Brunetta Jeans, PA-C  amLODipine (NORVASC) 10 MG tablet TAKE 1 TABLET BY MOUTH ONCE DAILY 03/01/15   Brunetta Jeans, PA-C  baclofen (LIORESAL) 10 MG tablet TAKE 1 TABLET (10 MG TOTAL) BY MOUTH 2 (TWO) TIMES DAILY. 10/29/14   Brunetta Jeans, PA-C  BREO ELLIPTA 100-25 MCG/INH AEPB Inhale 1 puff into the lungs every morning. daily 09/06/14   Historical Provider, MD  colchicine 0.6 MG  tablet Take 2 tablets by mouth.  Take another tablet in 1 hour.  Can repeat in 3 days. 01/18/15   Brunetta Jeans, PA-C  famotidine (PEPCID) 20 MG tablet Take 20 mg by mouth 2 (two) times daily.    Historical Provider, MD  fluticasone (FLONASE) 50 MCG/ACT nasal spray Place 1-2 sprays into both nostrils daily as needed for allergies or rhinitis.    Historical Provider, MD  lansoprazole (PREVACID SOLUTAB) 30  MG disintegrating tablet Take 1 tab in the morning daily. Patient taking differently: 1 tablets every other day 03/07/15   Amy S Esterwood, PA-C  loratadine (CLARITIN) 10 MG tablet Take 10 mg by mouth daily as needed for allergies.    Historical Provider, MD  metoprolol succinate (TOPROL-XL) 25 MG 24 hr tablet TAKE 1 TABLET BY MOUTH ONCE DAILY 01/26/15   Brunetta Jeans, PA-C  montelukast (SINGULAIR) 10 MG tablet Take 1 tablet (10 mg total) by mouth at bedtime. 10/27/14   Elsie Stain, MD  nitroGLYCERIN (NITROSTAT) 0.4 MG SL tablet Place 0.4 mg under the tongue every 5 (five) minutes as needed for chest pain.    Historical Provider, MD  omeprazole (PRILOSEC) 20 MG capsule Take 1 capsule (20 mg total) by mouth daily. Patient not taking: Reported on 04/21/2015 03/22/15   [name], MD  oxyCODONE-acetaminophen (PERCOCET/ROXICET) 5-325 MG per tablet Take 1-2 tablets by mouth every 6 (six) hours as needed for severe pain. 10/31/14   Alfonzo Beers, MD  OXYGEN Inhale 2 L into the lungs at bedtime.    Historical Provider, MD  oxymetazoline (AFRIN) 0.05 % nasal spray Place 1-2 sprays into both nostrils daily as needed for congestion.    Historical Provider, MD  predniSONE (DELTASONE) 20 MG tablet Take 20 mg by mouth daily as needed (tightness in chest.).    Historical Provider, MD  promethazine (PHENERGAN) 12.5 MG tablet Take 1 tablet (12.5 mg total) by mouth every 8 (eight) hours as needed for nausea or vomiting. 07/13/14   Brunetta Jeans, PA-C  SUPREP BOWEL PREP SOLN Take 1 kit by mouth once. 04/21/15    [name], MD  tiotropium (SPIRIVA) 18 MCG inhalation capsule Place 18 mcg into inhaler and inhale daily. IN AM    Historical Provider, MD   BP 136/70 mmHg  Pulse 72  Temp(Src) 98.3 F (36.8 C)  Resp 18  Ht 6' (1.829 m)  Wt 214 lb (97.07 kg)  BMI 29.02 kg/m2  SpO2 100% Physical Exam  Constitutional: He is oriented to person, place, and time. He appears well-developed and well-nourished. No distress.  HENT:  Head: Normocephalic and atraumatic.  Mouth/Throat: Oropharynx is clear and moist.  Eyes: Conjunctivae and EOM are normal. Pupils are equal, round, and reactive to light.  Neck: Normal range of motion. Neck supple. No tracheal deviation present.  Cardiovascular: Normal rate.   Pulmonary/Chest: Effort normal. No respiratory distress. He has wheezes. He has no rales.  There are slight expiratory weak rhonchi bilaterally.  Abdominal: Soft.  Musculoskeletal: Normal range of motion.  Neurological: He is alert and oriented to person, place, and time.  Skin: Skin is warm and dry.  Psychiatric: He has a normal mood and affect. His behavior is normal.  Nursing note and vitals reviewed.   ED Course  Procedures DIAGNOSTIC STUDIES: Oxygen Saturation is 100% on RA, normal by my interpretation.    COORDINATION OF CARE: 9:59 PM - Discussed normal chest XR. Discussed plans to order a breathing treatment. Pt advised of plan for treatment and pt agrees.  Labs Review Labs Reviewed - No data to display  Imaging Review Dg Chest 2 View  04/25/2015   CLINICAL DATA:  64 year old male with chest pain and shortness breath for the past day.  EXAM: CHEST  2 VIEW  COMPARISON:  Chest x-ray 01/09/2015.  FINDINGS: Lung volumes are low. No consolidative airspace disease. No pleural effusions. No pneumothorax. No pulmonary nodule or mass noted. Pulmonary vasculature and  the cardiomediastinal silhouette are within normal limits. Surgical clips project over the right upper quadrant of the abdomen,  compatible with prior cholecystectomy.  IMPRESSION: 1. Low lung volumes without radiographic evidence of acute cardiopulmonary disease.   Electronically Signed   By: Vinnie Langton M.D.   On: 04/25/2015 21:15     EKG Interpretation   Date/Time:  Monday April 25 2015 22:11:36 EDT Ventricular Rate:  69 PR Interval:  182 QRS Duration: 94 QT Interval:  408 QTC Calculation: 437 R Axis:   73 Text Interpretation:  Normal sinus rhythm Incomplete right bundle branch  block Cannot rule out Anterior infarct , age undetermined Abnormal ECG  Confirmed by Locklan Canoy  MD, Sendy Pluta ([id number]) on 04/25/2015 10:39:29 PM      MDM   Final diagnoses:  None    Patient with history of COPD. He presents with complaints of shortness of breath which is been worsening over the past 24 hours. He denies any fevers or chills. He denies any productive cough. He has slight wheezing on exam, however his vitals are stable and oxygen saturations are 100%. He was given an albuterol treatment, IV steroids and is feeling much improved. He will be discharged with prednisone, continued nebulizer treatments, and when necessary return.  I personally performed the services described in this documentation, which was scribed in my presence. The recorded information has been reviewed and is accurate.      Veryl Speak, MD 04/25/15 [phone]

## 2015-04-25 NOTE — ED Notes (Signed)
Pt c/o increased SOB x 1 day HX COPD

## 2015-04-25 NOTE — Telephone Encounter (Signed)
Left a message for call back.  

## 2015-04-25 NOTE — Discharge Instructions (Signed)
Prednisone as prescribed.  Continue albuterol treatments every 4 hours as needed for wheezing.  Return to the emergency department if you develop worsening breathing, chest pain or productive cough with fever, or other new and concerning symptoms.   Chronic Obstructive Pulmonary Disease Exacerbation Chronic obstructive pulmonary disease (COPD) is a common lung condition in which airflow from the lungs is limited. COPD is a general term that can be used to describe many different lung problems that limit airflow, including chronic bronchitis and emphysema. COPD exacerbations are episodes when breathing symptoms become much worse and require extra treatment. Without treatment, COPD exacerbations can be life threatening, and frequent COPD exacerbations can cause further damage to your lungs. CAUSES   Respiratory infections.   Exposure to smoke.   Exposure to air pollution, chemical fumes, or dust. Sometimes there is no apparent cause or trigger. RISK FACTORS  Smoking cigarettes.  Older age.  Frequent prior COPD exacerbations. SIGNS AND SYMPTOMS   Increased coughing.   Increased thick spit (sputum) production.   Increased wheezing.   Increased shortness of breath.   Rapid breathing.   Chest tightness. DIAGNOSIS  Your medical history, a physical exam, and tests will help your health care provider make a diagnosis. Tests may include:  A chest X-ray.  Basic lab tests.  Sputum testing.  An arterial blood gas test. TREATMENT  Depending on the severity of your COPD exacerbation, you may need to be admitted to a hospital for treatment. Some of the treatments commonly used to treat COPD exacerbations are:   Antibiotic medicines.   Bronchodilators. These are drugs that expand the air passages. They may be given with an inhaler or nebulizer. Spacer devices may be needed to help improve drug delivery.  Corticosteroid medicines.  Supplemental oxygen therapy.  HOME  CARE INSTRUCTIONS   Do not smoke. Quitting smoking is very important to prevent COPD from getting worse and exacerbations from happening as often.  Avoid exposure to all substances that irritate the airway, especially to tobacco smoke.   If you were prescribed an antibiotic medicine, finish it all even if you start to feel better.  Take all medicines as directed by your health care provider.It is important to use correct technique with inhaled medicines.  Drink enough fluids to keep your urine clear or pale yellow (unless you have a medical condition that requires fluid restriction).  Use a cool mist vaporizer. This makes it easier to clear your chest when you cough.   If you have a home nebulizer and oxygen, continue to use them as directed.   Maintain all necessary vaccinations to prevent infections.   Exercise regularly.   Eat a healthy diet.   Keep all follow-up appointments as directed by your health care provider. SEEK IMMEDIATE MEDICAL CARE IF:  You have worsening shortness of breath.   You have trouble talking.   You have severe chest pain.  You have blood in your sputum.  You have a fever.  You have weakness, vomit repeatedly, or faint.   You feel confused.   You continue to get worse. MAKE SURE YOU:   Understand these instructions.  Will watch your condition.  Will get help right away if you are not doing well or get worse. Document Released: 07/29/2007 Document Revised: 02/15/2014 Document Reviewed: 06/05/2013 [facility] Patient Information 2015 [place], [place]. This information is not intended to replace advice given to you by your health care provider. Make sure you discuss any questions you have with your health care provider.

## 2015-04-26 ENCOUNTER — Ambulatory Visit: Payer: Medicare Other | Admitting: Physician Assistant

## 2015-04-26 ENCOUNTER — Telehealth: Payer: Self-pay | Admitting: Physician Assistant

## 2015-04-26 NOTE — Telephone Encounter (Signed)
Pt was no show 04/25/15, left msg stating emergency at home, pt was rescheduled for 04/26/15 but called in stating he went to the ER and needed a 30 min appt, appt has been scheduled for 05/04/15 2:30pm.  Charge no show fee for 04/25/15? Charge no show fee for 04/26/15?

## 2015-04-27 ENCOUNTER — Encounter (HOSPITAL_COMMUNITY): Payer: Self-pay | Admitting: *Deleted

## 2015-04-27 NOTE — Telephone Encounter (Signed)
Charge for 7/12

## 2015-05-02 ENCOUNTER — Telehealth: Payer: Self-pay | Admitting: Internal Medicine

## 2015-05-02 NOTE — Telephone Encounter (Signed)
Pt states that he has not had a BM for 7 days and he is very uncomfortable. Pt wants to take an enema to see if this will help but he is afraid that the stool is so large and hard that he will not be able to pass it. Discussed with pt that he can try the enema but if he cannot pass the stool he will have to go to the ER if he is impacted. Pt verbalized understanding.

## 2015-05-02 NOTE — Telephone Encounter (Signed)
Pt called to report he has colonoscopy scheduled for tomorrow with Dr. Henrene Pastor Has been having issues with constipation and lower abd cramping pain when he tries laxatives He used enema this pm with great result, but he did see red blood in the toilet after wiping. Wonders if he needs to proceed with Suprep as ordered.   No abd pain now.  Mild nausea after BM with enema, but no vomiting I instructed he proceed with colon prep as instructed previously. Colonoscopy tomorrow to eval symptoms including rectal bleeding He voiced understanding and thanked me for the call

## 2015-05-03 ENCOUNTER — Encounter (HOSPITAL_COMMUNITY)
Admission: RE | Disposition: A | Discharge status: Home / Self Care | Payer: Self-pay | Source: Ambulatory Visit | Attending: Internal Medicine

## 2015-05-03 ENCOUNTER — Ambulatory Visit (HOSPITAL_COMMUNITY)
Admission: RE | Admit: 2015-05-03 | Discharge: 2015-05-03 | Disposition: A | Discharge status: Home / Self Care | Payer: Medicare Other | Source: Ambulatory Visit | Attending: Internal Medicine | Admitting: Internal Medicine

## 2015-05-03 ENCOUNTER — Ambulatory Visit (HOSPITAL_COMMUNITY): Payer: Medicare Other | Admitting: Anesthesiology

## 2015-05-03 ENCOUNTER — Encounter (HOSPITAL_COMMUNITY): Payer: Self-pay | Admitting: *Deleted

## 2015-05-03 DIAGNOSIS — Z87891 Personal history of nicotine dependence: Secondary | ICD-10-CM | POA: Diagnosis not present

## 2015-05-03 DIAGNOSIS — K219 Gastro-esophageal reflux disease without esophagitis: Secondary | ICD-10-CM | POA: Insufficient documentation

## 2015-05-03 DIAGNOSIS — K648 Other hemorrhoids: Secondary | ICD-10-CM | POA: Insufficient documentation

## 2015-05-03 DIAGNOSIS — E119 Type 2 diabetes mellitus without complications: Secondary | ICD-10-CM | POA: Diagnosis not present

## 2015-05-03 DIAGNOSIS — I10 Essential (primary) hypertension: Secondary | ICD-10-CM | POA: Insufficient documentation

## 2015-05-03 DIAGNOSIS — D125 Benign neoplasm of sigmoid colon: Secondary | ICD-10-CM | POA: Diagnosis not present

## 2015-05-03 DIAGNOSIS — E669 Obesity, unspecified: Secondary | ICD-10-CM | POA: Diagnosis not present

## 2015-05-03 DIAGNOSIS — D128 Benign neoplasm of rectum: Secondary | ICD-10-CM

## 2015-05-03 DIAGNOSIS — Z1211 Encounter for screening for malignant neoplasm of colon: Secondary | ICD-10-CM | POA: Diagnosis not present

## 2015-05-03 DIAGNOSIS — J449 Chronic obstructive pulmonary disease, unspecified: Secondary | ICD-10-CM | POA: Insufficient documentation

## 2015-05-03 DIAGNOSIS — Z9981 Dependence on supplemental oxygen: Secondary | ICD-10-CM | POA: Insufficient documentation

## 2015-05-03 DIAGNOSIS — Z79899 Other long term (current) drug therapy: Secondary | ICD-10-CM | POA: Diagnosis not present

## 2015-05-03 DIAGNOSIS — D120 Benign neoplasm of cecum: Secondary | ICD-10-CM | POA: Insufficient documentation

## 2015-05-03 DIAGNOSIS — K621 Rectal polyp: Secondary | ICD-10-CM | POA: Insufficient documentation

## 2015-05-03 DIAGNOSIS — G4730 Sleep apnea, unspecified: Secondary | ICD-10-CM | POA: Diagnosis not present

## 2015-05-03 DIAGNOSIS — D122 Benign neoplasm of ascending colon: Secondary | ICD-10-CM | POA: Diagnosis not present

## 2015-05-03 DIAGNOSIS — D123 Benign neoplasm of transverse colon: Secondary | ICD-10-CM | POA: Insufficient documentation

## 2015-05-03 DIAGNOSIS — Z9989 Dependence on other enabling machines and devices: Secondary | ICD-10-CM | POA: Diagnosis not present

## 2015-05-03 DIAGNOSIS — K635 Polyp of colon: Secondary | ICD-10-CM | POA: Diagnosis not present

## 2015-05-03 HISTORY — DX: Nausea with vomiting, unspecified: R11.2

## 2015-05-03 HISTORY — PX: COLONOSCOPY: SHX5424

## 2015-05-03 HISTORY — DX: Nausea with vomiting, unspecified: Z98.890

## 2015-05-03 SURGERY — COLONOSCOPY
Anesthesia: Monitor Anesthesia Care

## 2015-05-03 MED ORDER — PROPOFOL 10 MG/ML IV BOLUS
INTRAVENOUS | Status: DC | PRN
  Administered 2015-05-03: 20 mg via INTRAVENOUS
  Administered 2015-05-03: 30 mg via INTRAVENOUS
  Administered 2015-05-03: 20 mg via INTRAVENOUS
  Administered 2015-05-03: 30 mg via INTRAVENOUS
  Administered 2015-05-03 (×2): 20 mg via INTRAVENOUS
  Administered 2015-05-03: 30 mg via INTRAVENOUS
  Administered 2015-05-03 (×3): 20 mg via INTRAVENOUS
  Administered 2015-05-03: 50 mg via INTRAVENOUS
  Administered 2015-05-03: 20 mg via INTRAVENOUS
  Administered 2015-05-03: 40 mg via INTRAVENOUS
  Administered 2015-05-03 (×2): 20 mg via INTRAVENOUS
  Administered 2015-05-03 (×2): 30 mg via INTRAVENOUS
  Administered 2015-05-03: 40 mg via INTRAVENOUS
  Administered 2015-05-03 (×5): 20 mg via INTRAVENOUS
  Administered 2015-05-03: 30 mg via INTRAVENOUS
  Administered 2015-05-03 (×2): 20 mg via INTRAVENOUS
  Administered 2015-05-03: 40 mg via INTRAVENOUS
  Administered 2015-05-03: 30 mg via INTRAVENOUS
  Administered 2015-05-03: 20 mg via INTRAVENOUS

## 2015-05-03 MED ORDER — PROPOFOL 10 MG/ML IV BOLUS
INTRAVENOUS | Status: AC
  Filled 2015-05-03: qty 20

## 2015-05-03 MED ORDER — SODIUM CHLORIDE 0.9 % IV SOLN: INTRAVENOUS | Status: DC

## 2015-05-03 MED ORDER — LACTATED RINGERS IV SOLN
INTRAVENOUS | Status: DC
  Administered 2015-05-03: 1000 mL via INTRAVENOUS

## 2015-05-03 NOTE — Anesthesia Postprocedure Evaluation (Signed)
  Anesthesia Post-op Note  Patient: [name]  Procedure(s) Performed: Procedure(s): COLONOSCOPY (N/A)  Patient Location: Endoscopy Unit  Anesthesia Type:MAC  Level of Consciousness: awake and alert   Airway and Oxygen Therapy: Patient Spontanous Breathing  Post-op Pain: none  Post-op Assessment: Post-op Vital signs reviewed              Post-op Vital Signs: stable  Last Vitals:  Filed Vitals:   05/03/15 0944  BP:   Temp: 36.4 C  Resp:     Complications: No apparent anesthesia complications

## 2015-05-03 NOTE — Discharge Instructions (Signed)
Colonoscopy, Care After °These instructions give you information on caring for yourself after your procedure. Your doctor may also give you more specific instructions. Call your doctor if you have any problems or questions after your procedure. °HOME CARE °· Do not drive for 24 hours. °· Do not sign important papers or use machinery for 24 hours. °· You may shower. °· You may go back to your usual activities, but go slower for the first 24 hours. °· Take rest breaks often during the first 24 hours. °· Walk around or use warm packs on your belly (abdomen) if you have belly cramping or gas. °· Drink enough fluids to keep your pee (urine) clear or pale yellow. °· Resume your normal diet. Avoid heavy or fried foods. °· Avoid drinking alcohol for 24 hours or as told by your doctor. °· Only take medicines as told by your doctor. °If a tissue sample (biopsy) was taken during the procedure:  °· Do not take aspirin or blood thinners for 7 days, or as told by your doctor. °· Do not drink alcohol for 7 days, or as told by your doctor. °· Eat soft foods for the first 24 hours. °GET HELP IF: °You still have a small amount of blood in your poop (stool) 2-3 days after the procedure. °GET HELP RIGHT AWAY IF: °· You have more than a small amount of blood in your poop. °· You see clumps of tissue (blood clots) in your poop. °· Your belly is puffy (swollen). °· You feel sick to your stomach (nauseous) or throw up (vomit). °· You have a fever. °· You have belly pain that gets worse and medicine does not help. °MAKE SURE YOU: °· Understand these instructions. °· Will watch your condition. °· Will get help right away if you are not doing well or get worse. °Document Released: 11/03/2010 Document Revised: 10/06/2013 Document Reviewed: 06/08/2013 °[employer]® Patient Information ©2015 [employer]. This information is not intended to replace advice given to you by your health care provider. Make sure you discuss any questions you have with  your health care provider. ° °

## 2015-05-03 NOTE — H&P (View-Only) (Signed)
No allergies to eggs or soy No diet weight loss meds HOME OXYGEN No adverse reaction to anesthesia in the past   No email  Does not want emmi instructions

## 2015-05-03 NOTE — Transfer of Care (Signed)
Immediate Anesthesia Transfer of Care Note  Patient: [name]  Procedure(s) Performed: Procedure(s): COLONOSCOPY (N/A)  Patient Location: PACU  Anesthesia Type:MAC  Level of Consciousness: sedated  Airway & Oxygen Therapy: Patient Spontanous Breathing and Patient connected to nasal cannula oxygen  Post-op Assessment: Report given to RN and Post -op Vital signs reviewed and stable  Post vital signs: Reviewed and stable  Last Vitals:  Filed Vitals:   05/03/15 0738  BP: 132/61  Temp:   Resp:     Complications: No apparent anesthesia complications

## 2015-05-03 NOTE — Interval H&P Note (Signed)
History and Physical Interval Note:  05/03/2015 8:51 AM  [name]  has presented today for surgery, with the diagnosis of Screening for colon cancer  The various methods of treatment have been discussed with the patient and family. After consideration of risks, benefits and other options for treatment, the patient has consented to  Procedure(s): COLONOSCOPY (N/A) as a surgical intervention .  The patient's history has been reviewed, patient examined, no change in status, stable for surgery.  I have reviewed the patient's chart and labs.  Questions were answered to the patient's satisfaction.     [name]

## 2015-05-03 NOTE — Op Note (Signed)
[facility] [place], [id number]   COLONOSCOPY PROCEDURE REPORT  PATIENT: [name], [name]  MR#: [id number] BIRTHDATE: 1951-03-16 , 65  yrs. old GENDER: male ENDOSCOPIST: Eustace Quail, MD REFERRED TF:[id number] [place], [facility]. PROCEDURE DATE:  05/03/2015 PROCEDURE:   Colonoscopy, screening and Colonoscopy with snare polypectomy x 5 First Screening Colonoscopy - Avg.  risk and is 50 yrs.  old or older - No.  Prior Negative Screening - Now for repeat screening. 10 or more years since last screening  History of Adenoma - Now for follow-up colonoscopy & has been > or = to 3 yrs.  N/A  Polyps removed today? Yes ASA CLASS:   Class III INDICATIONS:Screening for colonic neoplasia and Colorectal Neoplasm Risk Assessment for this procedure is average risk.   Normal exam [place] 2008 MEDICATIONS: Monitored anesthesia care and Per Anesthesia  DESCRIPTION OF PROCEDURE:   After the risks benefits and alternatives of the procedure were thoroughly explained, informed consent was obtained.  The digital rectal exam revealed no abnormalities of the rectum.   The Pentax Ped Colon [id number] endoscope was introduced through the anus and advanced to the cecum, which was identified by both the appendix and ileocecal valve. No adverse events experienced.   The quality of the prep was excellent.  (Suprep was used)  The instrument was then slowly withdrawn as the colon was fully examined. Estimated blood loss is zero unless otherwise noted in this procedure report.  COLON FINDINGS: Five polyps were found in the sigmoid colon (13mm), transverse colon (98mm), cecum (24mm),  ascending colon (15mm,5mm) and rectum (65mm).  A polypectomy was performed with a cold snare for the subcentimeter polyps and hot snare for the rectal polyp.  The resection was complete, the polyp tissue was completely retrieved and sent to histology.   The examination was otherwise normal. Retroflexed views  revealed internal hemorrhoids. The time to cecum = 1.9 Withdrawal time = 22.1   The scope was withdrawn and the procedure completed. COMPLICATIONS: There were no immediate complications.  ENDOSCOPIC IMPRESSION: 1.   Five polyps were in the colon; polypectomy was performed with a cold snare 2.   The examination was otherwise normal 3. Constipation  RECOMMENDATIONS: 1. Repeat Colonoscopy in 3 years. 2. Laxative of choice  eSigned:  Eustace Quail, MD 05/03/2015 9:45 AM   cc: The Patient and Raiford Noble, [facility]

## 2015-05-03 NOTE — Anesthesia Preprocedure Evaluation (Signed)
Anesthesia Evaluation  Patient identified by MRN, date of birth, ID band Patient awake    Reviewed: Allergy & Precautions, H&P , NPO status , Patient's Chart, lab work & pertinent test results  History of Anesthesia Complications (+) history of anesthetic complications  Airway Mallampati: II  TM Distance: >3 FB Neck ROM: Full    Dental no notable dental hx. (+) Dental Advisory Given, Poor Dentition, Partial Upper   Pulmonary shortness of breath, sleep apnea and Continuous Positive Airway Pressure Ventilation , COPD COPD inhaler, former smoker,  Elevated hemidiaphragm Cleared for surgery by pulmonologist, no recent exacerbation, reports breathing as normal, 2L home O2 at night breath sounds clear to auscultation  Pulmonary exam normal       Cardiovascular hypertension, Pt. on medications and Pt. on home beta blockers Normal cardiovascular exam+ dysrhythmias Rhythm:Regular Rate:Normal  Echo 08/2014 Study Conclusions  - Left ventricle: The cavity size was normal. There was mildconcentric hypertrophy. Systolic function was normal. Theestimated ejection fraction was in the range of 55% to 60%. Wallmotion was normal; there were no regional wall motionabnormalities.   Neuro/Psych PSYCHIATRIC DISORDERS negative neurological ROS     GI/Hepatic Neg liver ROS, GERD-  Medicated and Controlled,  Endo/Other  diabetes, Type 2, Oral Hypoglycemic AgentsObesity   Renal/GU      Musculoskeletal   Abdominal   Peds  Hematology negative hematology ROS (+)   Anesthesia Other Findings   Reproductive/Obstetrics negative OB ROS                             Anesthesia Physical Anesthesia Plan  ASA: III  Anesthesia Plan: MAC   Post-op Pain Management:    Induction: Intravenous  Airway Management Planned: Natural Airway  Additional Equipment:   Intra-op Plan:   Post-operative Plan:   Informed  Consent: I have reviewed the patients History and Physical, chart, labs and discussed the procedure including the risks, benefits and alternatives for the proposed anesthesia with the patient or authorized representative who has indicated his/her understanding and acceptance.     Plan Discussed with: CRNA and Surgeon  Anesthesia Plan Comments:         Anesthesia Quick Evaluation

## 2015-05-04 ENCOUNTER — Ambulatory Visit: Payer: Medicare Other | Admitting: Physician Assistant

## 2015-05-04 ENCOUNTER — Ambulatory Visit (INDEPENDENT_AMBULATORY_CARE_PROVIDER_SITE_OTHER): Payer: Medicare Other | Admitting: Physician Assistant

## 2015-05-04 ENCOUNTER — Encounter: Payer: Self-pay | Admitting: Internal Medicine

## 2015-05-04 ENCOUNTER — Encounter: Payer: Self-pay | Admitting: Physician Assistant

## 2015-05-04 ENCOUNTER — Ambulatory Visit: Payer: Self-pay | Admitting: Physician Assistant

## 2015-05-04 VITALS — BP 108/64 | HR 74 | Temp 98.3°F | Ht 72.0 in | Wt 209.1 lb

## 2015-05-04 DIAGNOSIS — K5904 Chronic idiopathic constipation: Secondary | ICD-10-CM

## 2015-05-04 DIAGNOSIS — K5900 Constipation, unspecified: Secondary | ICD-10-CM

## 2015-05-04 MED ORDER — LINACLOTIDE 145 MCG PO CAPS: 145.0000 ug | ORAL_CAPSULE | Freq: Every day | ORAL | Status: DC

## 2015-05-04 NOTE — Progress Notes (Signed)
Pre visit review using our clinic review tool, if applicable. No additional management support is needed unless otherwise documented below in the visit note. 

## 2015-05-04 NOTE — Patient Instructions (Signed)
Please start the Linzess daily for constipation. Continue fiber supplement and hydration.  Continue all other medications as directed.  Please call to schedule an appointment with Karna Christmas or Almyra Free for anger management. They are absolutely fantastic and will be a great help.

## 2015-05-05 ENCOUNTER — Encounter (HOSPITAL_COMMUNITY): Payer: Self-pay | Admitting: Internal Medicine

## 2015-05-05 MED ORDER — BREO ELLIPTA 100-25 MCG/INH IN AEPB: 1.0000 | INHALATION_SPRAY | Freq: Every morning | RESPIRATORY_TRACT | Status: DC

## 2015-05-05 MED ORDER — TIOTROPIUM BROMIDE MONOHYDRATE 18 MCG IN CAPS: 18.0000 ug | ORAL_CAPSULE | Freq: Every day | RESPIRATORY_TRACT | Status: DC

## 2015-05-05 MED ORDER — ALBUTEROL SULFATE HFA 108 (90 BASE) MCG/ACT IN AERS: 2.0000 | INHALATION_SPRAY | Freq: Four times a day (QID) | RESPIRATORY_TRACT | Status: DC | PRN

## 2015-05-08 DIAGNOSIS — K5904 Chronic idiopathic constipation: Secondary | ICD-10-CM

## 2015-05-08 HISTORY — DX: Chronic idiopathic constipation: K59.04

## 2015-05-08 NOTE — Assessment & Plan Note (Signed)
Recent colonoscopy with benign polyps noted. Bowel regimen again reviewed with patient. Patient is willing to attempt the Linzess. Rx written. Take as directed. Follow-up 1 month.

## 2015-05-08 NOTE — Progress Notes (Signed)
Patient presents to clinic today to discuss chronic constipation as well as to discuss recent colonoscopy with GI. Patient still having constipation despite elaborate bowel regimen. Linzess previously recommended and prescribed but patient states he never picked up or began medication. Endorses having to resort to enemas and stool softeners to get substantial bowel movement. Endorses well-balanced diet. Recent colonoscopy revealed multiple polyps that were biopsied. Repeat colonoscopy recommended in 3 years pending results.  Past Medical History  Diagnosis Date  . Hypertension   . Prostate cancer   . GERD (gastroesophageal reflux disease)   . History of TB (tuberculosis)     1984--  hospitalized for 4 month treatment  . Post-polio syndrome     polio at age 40--PT WAS IN IRON LUNG; PT WAS IN W/C UNTIL AGE 30; STILL HAS WEAKNESS RIGHT SIDE  . History of chronic bronchitis   . History of rheumatic fever   . Bladder outlet obstruction   . Anxiety disorder   . History of urinary retention   . Nocturnal oxygen desaturation     USES O2 NIGHTLY  . Prostate cancer   . Complication of anesthesia     DIFFICULT WAKING   . Diabetes mellitus without complication     BODERLINE - DIET CONTROL  . Anxiety   . Shortness of breath dyspnea     RIGHT HEMIDIAPHRAGM ELEVATION - POST POLIO SYNDROME  . Dysrhythmia     PVC'S  . Arthritis     BILATERAL SHOULDERS, ELBOWS AND HANDS AND LEFT HIP AND KNEES--HX CORTISONE SHOTS IN SHOULDERS, ELBOWS, HIP AND KNEES  . History of oxygen administration     oxygen use 2 l/m nasally at bedtime and exertional occasions  . COPD, frequent exacerbations     pulmologist-  dr Joya Gaskins--  Girtha Rm Stage C.04-25-15 recent COPD exacerbation-much improved now, after tx. in ER [facility].  Marland Kitchen PONV (postoperative nausea and vomiting)     Current Outpatient Prescriptions on File Prior to Visit  Medication Sig Dispense Refill  . ALPRAZolam (XANAX) 1 MG tablet Take 0.5 tablets (0.5  mg total) by mouth 3 (three) times daily as needed for anxiety. 90 tablet 0  . amLODipine (NORVASC) 10 MG tablet TAKE 1 TABLET BY MOUTH ONCE DAILY 90 tablet 1  . baclofen (LIORESAL) 10 MG tablet TAKE 1 TABLET (10 MG TOTAL) BY MOUTH 2 (TWO) TIMES DAILY. 60 tablet 2  . colchicine 0.6 MG tablet Take 2 tablets by mouth.  Take another tablet in 1 hour.  Can repeat in 3 days. 6 tablet 3  . famotidine (PEPCID) 20 MG tablet Take 20 mg by mouth 2 (two) times daily.    . fluticasone (FLONASE) 50 MCG/ACT nasal spray Place 1-2 sprays into both nostrils daily as needed for allergies or rhinitis.    Marland Kitchen lansoprazole (PREVACID SOLUTAB) 30 MG disintegrating tablet Take 1 tab in the morning daily. (Patient taking differently: 1 tablets every other day) 30 tablet 6  . loratadine (CLARITIN) 10 MG tablet Take 10 mg by mouth daily as needed for allergies.    . metoprolol succinate (TOPROL-XL) 25 MG 24 hr tablet TAKE 1 TABLET BY MOUTH ONCE DAILY 30 tablet 3  . montelukast (SINGULAIR) 10 MG tablet Take 1 tablet (10 mg total) by mouth at bedtime. 30 tablet 5  . nitroGLYCERIN (NITROSTAT) 0.4 MG SL tablet Place 0.4 mg under the tongue every 5 (five) minutes as needed for chest pain.    Marland Kitchen omeprazole (PRILOSEC) 20 MG capsule Take 1 capsule (  20 mg total) by mouth daily. 30 capsule 11  . ondansetron (ZOFRAN ODT) 8 MG disintegrating tablet 89m ODT q4 hours prn nausea 6 tablet 0  . oxyCODONE-acetaminophen (PERCOCET/ROXICET) 5-325 MG per tablet Take 1-2 tablets by mouth every 6 (six) hours as needed for severe pain. 15 tablet 0  . OXYGEN Inhale 2 L into the lungs at bedtime.    .Marland Kitchenoxymetazoline (AFRIN) 0.05 % nasal spray Place 1-2 sprays into both nostrils daily as needed for congestion.    . predniSONE (DELTASONE) 20 MG tablet Take 20 mg by mouth daily as needed (tightness in chest.).    .Marland KitchenpredniSONE (DELTASONE) 20 MG tablet 3 tabs po day one, then 2 po daily x 4 days 11 tablet 0  . promethazine (PHENERGAN) 12.5 MG tablet Take 1  tablet (12.5 mg total) by mouth every 8 (eight) hours as needed for nausea or vomiting. 30 tablet 0   No current facility-administered medications on file prior to visit.    Allergies  Allergen Reactions  . Ivp Dye [Iodinated Diagnostic Agents] Anaphylaxis  . Levaquin [Levofloxacin In D5w] Shortness Of Breath    In addition: sweating, chest pain, and diarrhea.   . Penicillins Anaphylaxis    Heart stops  . Aspirin Other (See Comments)    Reaction unknown  . Morphine And Related Nausea And Vomiting    Can take with zofran   . Nsaids Other (See Comments)    Difficulty breathing    Family History  Problem Relation Age of Onset  . Alzheimer's disease Father 4    Deceased  . Stomach cancer Father   . Heart attack Father   . Heart disease Father   . Skin cancer Mother     Facial-Living  . Alcohol abuse Sister     x2  . Mental illness Sister     x2  . Diabetes Maternal Aunt     x2  . Thyroid disease Maternal Aunt     x4  . Diabetes Maternal Uncle   . Tuberculosis Paternal Grandfather   . Tuberculosis Paternal Grandmother   . Alzheimer's disease Paternal Aunt   . Alzheimer's disease Paternal Uncle   . Colon cancer Neg Hx   . Colon polyps Neg Hx   . Crohn's disease Neg Hx   . Ulcerative colitis Neg Hx     History   Social History  . Marital Status: Married    Spouse Name: N/A  . Number of Children: N/A  . Years of Education: N/A   Occupational History  . Retired    Social History Main Topics  . Smoking status: Former Smoker -- 490years    Quit date: 08/25/2013  . Smokeless tobacco: Never Used     Comment: hx  smoke a pack of pipe tobacco(1.5oz) every 10 days.   . Alcohol Use: 7.2 oz/week    12 Cans of beer per week     Comment: average 3 beers per day  . Drug Use: No  . Sexual Activity: Not on file   Other Topics Concern  . None   Social History Narrative   Review of Systems - See HPI.  All other ROS are negative.  BP 108/64 mmHg  Pulse 74   Temp(Src) 98.3 F (36.8 C) (Oral)  Ht 6' (1.829 m)  Wt 209 lb 2 oz (94.858 kg)  BMI 28.36 kg/m2  SpO2 96%  Physical Exam  Constitutional: He is well-developed, well-nourished, and in no distress.  Cardiovascular: Normal rate, regular  rhythm, normal heart sounds and intact distal pulses.   Pulmonary/Chest: Effort normal and breath sounds normal. No respiratory distress. He has no wheezes. He has no rales. He exhibits no tenderness.  Abdominal: Soft. Bowel sounds are normal.  Vitals reviewed.   Recent Results (from the past 2160 hour(s))  Hemoglobin A1c     Status: None   Collection Time: 03/21/15  4:10 PM  Result Value Ref Range   Hgb A1c MFr Bld 5.8 4.6 - 6.5 %    Comment: Glycemic Control Guidelines for People with Diabetes:Non Diabetic:  <6%Goal of Therapy: <7%Additional Action Suggested:  >8%   Basic metabolic panel     Status: None   Collection Time: 04/25/15 10:29 PM  Result Value Ref Range   Sodium 139 135 - 145 mmol/L   Potassium 3.6 3.5 - 5.1 mmol/L   Chloride 103 101 - 111 mmol/L   CO2 26 22 - 32 mmol/L   Glucose, Bld 92 65 - 99 mg/dL   BUN 18 6 - 20 mg/dL   Creatinine, Ser 1.22 0.61 - 1.24 mg/dL   Calcium 9.5 8.9 - 10.3 mg/dL   GFR calc non Af Amer >60 >60 mL/min   GFR calc Af Amer >60 >60 mL/min    Comment: (NOTE) The eGFR has been calculated using the CKD EPI equation. This calculation has not been validated in all clinical situations. eGFR's persistently <60 mL/min signify possible Chronic Kidney Disease.    Anion gap 10 5 - 15  CBC with Differential     Status: Abnormal   Collection Time: 04/25/15 10:29 PM  Result Value Ref Range   WBC 5.8 4.0 - 10.5 K/uL   RBC 4.58 4.22 - 5.81 MIL/uL   Hemoglobin 14.7 13.0 - 17.0 g/dL   HCT 43.2 39.0 - 52.0 %   MCV 94.3 78.0 - 100.0 fL   MCH 32.1 26.0 - 34.0 pg   MCHC 34.0 30.0 - 36.0 g/dL   RDW 13.2 11.5 - 15.5 %   Platelets 216 150 - 400 K/uL   Neutrophils Relative % 53 43 - 77 %   Neutro Abs 3.1 1.7 - 7.7 K/uL     Lymphocytes Relative 32 12 - 46 %   Lymphs Abs 1.8 0.7 - 4.0 K/uL   Monocytes Relative 9 3 - 12 %   Monocytes Absolute 0.5 0.1 - 1.0 K/uL   Eosinophils Relative 6 (H) 0 - 5 %   Eosinophils Absolute 0.3 0.0 - 0.7 K/uL   Basophils Relative 0 0 - 1 %   Basophils Absolute 0.0 0.0 - 0.1 K/uL  Troponin I     Status: None   Collection Time: 04/25/15 10:29 PM  Result Value Ref Range   Troponin I <0.03 <0.031 ng/mL    Comment:        NO INDICATION OF MYOCARDIAL INJURY.     Assessment/Plan: Chronic idiopathic constipation Recent colonoscopy with benign polyps noted. Bowel regimen again reviewed with patient. Patient is willing to attempt the Linzess. Rx written. Take as directed. Follow-up 1 month.

## 2015-05-14 ENCOUNTER — Other Ambulatory Visit: Payer: Self-pay | Admitting: Physician Assistant

## 2015-05-18 ENCOUNTER — Emergency Department (HOSPITAL_COMMUNITY): Payer: Medicare Other

## 2015-05-18 ENCOUNTER — Encounter (HOSPITAL_COMMUNITY): Payer: Self-pay | Admitting: Emergency Medicine

## 2015-05-18 ENCOUNTER — Emergency Department (HOSPITAL_COMMUNITY)
Admission: EM | Admit: 2015-05-18 | Discharge: 2015-05-18 | Disposition: A | Discharge status: Home / Self Care | Payer: Medicare Other | Attending: Emergency Medicine | Admitting: Emergency Medicine

## 2015-05-18 DIAGNOSIS — Z79899 Other long term (current) drug therapy: Secondary | ICD-10-CM | POA: Insufficient documentation

## 2015-05-18 DIAGNOSIS — Z8619 Personal history of other infectious and parasitic diseases: Secondary | ICD-10-CM | POA: Diagnosis not present

## 2015-05-18 DIAGNOSIS — S060X0A Concussion without loss of consciousness, initial encounter: Secondary | ICD-10-CM | POA: Diagnosis not present

## 2015-05-18 DIAGNOSIS — Z8611 Personal history of tuberculosis: Secondary | ICD-10-CM | POA: Diagnosis not present

## 2015-05-18 DIAGNOSIS — S199XXA Unspecified injury of neck, initial encounter: Secondary | ICD-10-CM | POA: Diagnosis not present

## 2015-05-18 DIAGNOSIS — W2209XA Striking against other stationary object, initial encounter: Secondary | ICD-10-CM | POA: Insufficient documentation

## 2015-05-18 DIAGNOSIS — I10 Essential (primary) hypertension: Secondary | ICD-10-CM | POA: Diagnosis not present

## 2015-05-18 DIAGNOSIS — Y998 Other external cause status: Secondary | ICD-10-CM | POA: Insufficient documentation

## 2015-05-18 DIAGNOSIS — J441 Chronic obstructive pulmonary disease with (acute) exacerbation: Secondary | ICD-10-CM | POA: Diagnosis not present

## 2015-05-18 DIAGNOSIS — Z87448 Personal history of other diseases of urinary system: Secondary | ICD-10-CM | POA: Insufficient documentation

## 2015-05-18 DIAGNOSIS — Z880 Allergy status to penicillin: Secondary | ICD-10-CM | POA: Diagnosis not present

## 2015-05-18 DIAGNOSIS — F419 Anxiety disorder, unspecified: Secondary | ICD-10-CM | POA: Insufficient documentation

## 2015-05-18 DIAGNOSIS — S098XXA Other specified injuries of head, initial encounter: Secondary | ICD-10-CM | POA: Diagnosis not present

## 2015-05-18 DIAGNOSIS — K219 Gastro-esophageal reflux disease without esophagitis: Secondary | ICD-10-CM | POA: Diagnosis not present

## 2015-05-18 DIAGNOSIS — Y9301 Activity, walking, marching and hiking: Secondary | ICD-10-CM | POA: Insufficient documentation

## 2015-05-18 DIAGNOSIS — E119 Type 2 diabetes mellitus without complications: Secondary | ICD-10-CM | POA: Diagnosis not present

## 2015-05-18 DIAGNOSIS — Z8546 Personal history of malignant neoplasm of prostate: Secondary | ICD-10-CM | POA: Insufficient documentation

## 2015-05-18 DIAGNOSIS — R51 Headache: Secondary | ICD-10-CM | POA: Diagnosis not present

## 2015-05-18 DIAGNOSIS — Z87891 Personal history of nicotine dependence: Secondary | ICD-10-CM | POA: Diagnosis not present

## 2015-05-18 DIAGNOSIS — Y9289 Other specified places as the place of occurrence of the external cause: Secondary | ICD-10-CM | POA: Diagnosis not present

## 2015-05-18 DIAGNOSIS — S0990XA Unspecified injury of head, initial encounter: Secondary | ICD-10-CM | POA: Diagnosis present

## 2015-05-18 MED ORDER — ONDANSETRON 8 MG PO TBDP
8.0000 mg | ORAL_TABLET | Freq: Once | ORAL | Status: AC
  Administered 2015-05-18: 8 mg via ORAL
  Filled 2015-05-18: qty 1

## 2015-05-18 MED ORDER — ACETAMINOPHEN 325 MG PO TABS
650.0000 mg | ORAL_TABLET | Freq: Once | ORAL | Status: AC
  Administered 2015-05-18: 650 mg via ORAL
  Filled 2015-05-18: qty 2

## 2015-05-18 MED ORDER — ONDANSETRON HCL 4 MG PO TABS: 4.0000 mg | ORAL_TABLET | Freq: Four times a day (QID) | ORAL | Status: DC

## 2015-05-18 NOTE — ED Provider Notes (Signed)
CSN: [id number]     Arrival date & time 05/18/15  1908 History  This chart was scribed for non-physician practitioner, Charlann Lange, PA-C working with Tanna Furry, MD by Hansel Feinstein, ED scribe. This patient was seen in room WTR6/WTR6 and the patient's care was started at 8:25 PM    Chief Complaint  Patient presents with  . Headache   The history is provided by the patient. No language interpreter was used.    HPI Comments: [name] is a 64 y.o. male who presents to the Emergency Department complaining of moderate HA onset earlier this evening as a result of walking into a tree, hitting his central forehead. He states associated neck pain, nausea, SOB, palpitations. No treatments tried PTA. Pt is allergic to Ivp dye; Levaquin; Penicillins; Aspirin; Morphine and related; and Nsaids. He denies LOC, hitting his chest, any other injuries. He also denies vomiting, abdominal pain.   Past Medical History  Diagnosis Date  . Hypertension   . Prostate cancer   . GERD (gastroesophageal reflux disease)   . History of TB (tuberculosis)     1984--  hospitalized for 4 month treatment  . Post-polio syndrome     polio at age 64--PT WAS IN IRON LUNG; PT WAS IN W/C UNTIL AGE 45; STILL HAS WEAKNESS RIGHT SIDE  . History of chronic bronchitis   . History of rheumatic fever   . Bladder outlet obstruction   . Anxiety disorder   . History of urinary retention   . Nocturnal oxygen desaturation     USES O2 NIGHTLY  . Prostate cancer   . Complication of anesthesia     DIFFICULT WAKING   . Diabetes mellitus without complication     BODERLINE - DIET CONTROL  . Anxiety   . Shortness of breath dyspnea     RIGHT HEMIDIAPHRAGM ELEVATION - POST POLIO SYNDROME  . Dysrhythmia     PVC'S  . Arthritis     BILATERAL SHOULDERS, ELBOWS AND HANDS AND LEFT HIP AND KNEES--HX CORTISONE SHOTS IN SHOULDERS, ELBOWS, HIP AND KNEES  . History of oxygen administration     oxygen use 2 l/m nasally at bedtime and exertional  occasions  . COPD, frequent exacerbations     pulmologist-  dr Joya Gaskins--  Girtha Rm Stage C.04-25-15 recent COPD exacerbation-much improved now, after tx. in ER [facility].  Marland Kitchen PONV (postoperative nausea and vomiting)    Past Surgical History  Procedure Laterality Date  . Other surgical history       Muscle & bone Graft/Polio  . Cystoscopy w/ cystogram/  transrectal ultrasound prostate bx  03-22-2009  . Cardiovascular stress test  06-08-2014  dr Mare Ferrari    normal lexiscan study/  no ischemia/  not gated due to PAC's  . Ureterosopy stone extraction  2000  . Nasal septum surgery  2000  . Shoulder arthroscopy with open rotator cuff repair Bilateral 2013  &  1999    removal spurs and bursectomy  . Laparoscopic cholecystectomy  2013  . Prostate biopsy N/A 09/28/2014    Procedure: PROSTATE ULTRASOUND/BIOPSY;  Surgeon: [name], MD;  Location: [facility];  Service: Urology;  Laterality: N/A;  . Transurethral resection of prostate N/A 09/28/2014    Procedure: TRANSURETHRAL RESECTION OF THE PROSTATE (TURP);  Surgeon: [name], MD;  Location: [facility];  Service: Urology;  Laterality: N/A;  . Esophagogastroduodenoscopy N/A 03/22/2015    Procedure: ESOPHAGOGASTRODUODENOSCOPY (EGD) with dilation;  Surgeon: [name], MD;  Location: [facility];  Service: Endoscopy;  Laterality: N/A;  . Savory dilation N/A 03/22/2015    Procedure: SAVORY DILATION;  Surgeon: [name], MD;  Location: [facility];  Service: Endoscopy;  Laterality: N/A;  . Colonoscopy N/A 05/03/2015    Procedure: COLONOSCOPY;  Surgeon: [name], MD;  Location: [facility];  Service: Endoscopy;  Laterality: N/A;   Family History  Problem Relation Age of Onset  . Alzheimer's disease Father 4    Deceased  . Stomach cancer Father   . Heart attack Father   . Heart disease Father   . Skin cancer Mother     Facial-Living  . Alcohol abuse Sister     x2  . Mental illness Sister     x2  . Diabetes Maternal Aunt     x2  .  Thyroid disease Maternal Aunt     x4  . Diabetes Maternal Uncle   . Tuberculosis Paternal Grandfather   . Tuberculosis Paternal Grandmother   . Alzheimer's disease Paternal Aunt   . Alzheimer's disease Paternal Uncle   . Colon cancer Neg Hx   . Colon polyps Neg Hx   . Crohn's disease Neg Hx   . Ulcerative colitis Neg Hx    History  Substance Use Topics  . Smoking status: Former Smoker -- 32 years    Quit date: 08/25/2013  . Smokeless tobacco: Never Used     Comment: hx  smoke a pack of pipe tobacco(1.5oz) every 10 days.   . Alcohol Use: 7.2 oz/week    12 Cans of beer per week     Comment: average 3 beers per day    Review of Systems  Respiratory: Positive for shortness of breath.   Cardiovascular: Positive for palpitations.  Gastrointestinal: Positive for nausea. Negative for vomiting and abdominal pain.  Musculoskeletal: Positive for neck pain.  Neurological: Positive for headaches.   Allergies  Ivp dye; Levaquin; Penicillins; Aspirin; Morphine and related; and Nsaids  Home Medications   Prior to Admission medications   Medication Sig Start Date End Date Taking? Authorizing Provider  albuterol (PROVENTIL HFA;VENTOLIN HFA) 108 (90 BASE) MCG/ACT inhaler Inhale 2 puffs into the lungs every 6 (six) hours as needed for wheezing or shortness of breath. 05/05/15  Yes Brunetta Jeans, PA-C  ALPRAZolam [name]) 1 MG tablet Take 0.5 tablets (0.5 mg total) by mouth 3 (three) times daily as needed for anxiety. 01/11/15  Yes Brunetta Jeans, PA-C  amLODipine (NORVASC) 10 MG tablet TAKE 1 TABLET BY MOUTH ONCE DAILY 03/01/15  Yes Brunetta Jeans, PA-C  BREO ELLIPTA 100-25 MCG/INH AEPB Inhale 1 puff into the lungs every morning. daily 05/05/15  Yes Brunetta Jeans, PA-C  calcium carbonate (TUMS - DOSED IN MG ELEMENTAL CALCIUM) 500 MG chewable tablet Chew 1 tablet by mouth 3 (three) times daily as needed for indigestion or heartburn.   Yes Historical Provider, MD  famotidine (PEPCID) 20 MG  tablet Take 20 mg by mouth 2 (two) times daily.   Yes Historical Provider, MD  loratadine (CLARITIN) 10 MG tablet TAKE 1 TABLET BY MOUTH EVERY DAY 05/14/15  Yes Brunetta Jeans, PA-C  metoprolol succinate (TOPROL-XL) 25 MG 24 hr tablet TAKE 1 TABLET BY MOUTH ONCE DAILY 01/26/15  Yes Brunetta Jeans, PA-C  montelukast (SINGULAIR) 10 MG tablet Take 1 tablet (10 mg total) by mouth at bedtime. 10/27/14  Yes Elsie Stain, MD  ondansetron (ZOFRAN ODT) 8 MG disintegrating tablet 8mg  ODT q4 hours prn nausea 04/25/15  Yes  Veryl Speak, MD  OXYGEN Inhale 2 L into the lungs at bedtime.   Yes Historical Provider, MD  oxymetazoline (AFRIN) 0.05 % nasal spray Place 1-2 sprays into both nostrils daily as needed for congestion.   Yes Historical Provider, MD  predniSONE (DELTASONE) 20 MG tablet Take 20 mg by mouth daily as needed (tightness in chest).    Yes Historical Provider, MD  tiotropium (SPIRIVA) 18 MCG inhalation capsule Place 1 capsule (18 mcg total) into inhaler and inhale daily. IN AM 05/05/15  Yes Brunetta Jeans, PA-C  baclofen (LIORESAL) 10 MG tablet TAKE 1 TABLET (10 MG TOTAL) BY MOUTH 2 (TWO) TIMES DAILY. Patient taking differently: TAKE 1 TABLET (10 MG TOTAL) BY MOUTH 2 (TWO) TIMES DAILY AS NEEDED 10/29/14   Brunetta Jeans, PA-C  colchicine 0.6 MG tablet Take 2 tablets by mouth.  Take another tablet in 1 hour.  Can repeat in 3 days. Patient not taking: Reported on 05/18/2015 01/18/15   Brunetta Jeans, PA-C  lansoprazole (PREVACID SOLUTAB) 30 MG disintegrating tablet Take 1 tab in the morning daily. Patient not taking: Reported on 05/18/2015 03/07/15   Amy S Esterwood, PA-C  Linaclotide (LINZESS) 145 MCG CAPS capsule Take 1 capsule (145 mcg total) by mouth daily. 05/04/15   Brunetta Jeans, PA-C  nitroGLYCERIN (NITROSTAT) 0.4 MG SL tablet Place 0.4 mg under the tongue every 5 (five) minutes as needed for chest pain.    Historical Provider, MD  omeprazole (PRILOSEC) 20 MG capsule Take 1 capsule (20 mg  total) by mouth daily. Patient not taking: Reported on 05/18/2015 03/22/15   [name], MD  oxyCODONE-acetaminophen (PERCOCET/ROXICET) 5-325 MG per tablet Take 1-2 tablets by mouth every 6 (six) hours as needed for severe pain. Patient not taking: Reported on 05/18/2015 10/31/14   Alfonzo Beers, MD  predniSONE (DELTASONE) 20 MG tablet 3 tabs po day one, then 2 po daily x 4 days Patient not taking: Reported on 05/18/2015 04/25/15   April Palumbo, MD  promethazine (PHENERGAN) 12.5 MG tablet Take 1 tablet (12.5 mg total) by mouth every 8 (eight) hours as needed for nausea or vomiting. 07/13/14   Brunetta Jeans, PA-C   BP 147/81 mmHg  Pulse 73  Temp(Src) 98.5 F (36.9 C) (Oral)  Resp 20  Ht 6\' 1"  (1.854 m)  Wt 214 lb (97.07 kg)  BMI 28.24 kg/m2  SpO2 97% Physical Exam  Constitutional: He is oriented to person, place, and time. He appears well-developed and well-nourished.  HENT:  Head: Normocephalic and atraumatic.  Eyes: Conjunctivae and EOM are normal. Pupils are equal, round, and reactive to light.  Neck: Normal range of motion. Neck supple.  Cardiovascular: Normal rate.   Pulmonary/Chest: Effort normal. No respiratory distress.  Abdominal: Soft. He exhibits no distension. There is no tenderness.  Musculoskeletal: Normal range of motion. He exhibits no edema.  Neurological: He is alert and oriented to person, place, and time. Coordination normal.  Speech clear, focused and coherent. No coordination deficits. Ambulatory without imbalance. CN's 3-12 intact.   Skin: Skin is warm and dry.  No hematomas, bruises, abrasions  Psychiatric: He has a normal mood and affect. His behavior is normal.  Nursing note and vitals reviewed.  ED Course  Procedures (including critical care time) DIAGNOSTIC STUDIES: Oxygen Saturation is 97% on RA, normal by my interpretation.    COORDINATION OF CARE: 8:29 PM Discussed treatment plan with pt at bedside and pt agreed to plan.   Labs Review Labs Reviewed  -  No data to display  Imaging Review Ct Head Wo Contrast  05/18/2015   CLINICAL DATA:  Walked into a tree hitting central forehead. Moderate headache.  EXAM: CT HEAD WITHOUT CONTRAST  CT CERVICAL SPINE WITHOUT CONTRAST  TECHNIQUE: Multidetector CT imaging of the head and cervical spine was performed following the standard protocol without intravenous contrast. Multiplanar CT image reconstructions of the cervical spine were also generated.  COMPARISON:  03/27/2014  FINDINGS: CT HEAD FINDINGS  The ventricles, cisterns and other CSF spaces are within normal. There is no mass, mass effect, shift of midline structures or acute hemorrhage. No evidence of acute infarction. There is opacification over the right maxillary sinus. No evidence of fracture.  CT CERVICAL SPINE FINDINGS  Vertebral body alignment and heights are normal. There is mild spondylosis throughout the cervical spine. There is mild disc space narrowing at the C5-6 level. Prevertebral soft tissues are within normal. There is mild uncovertebral joint spurring and moderate facet arthropathy. Bilateral neural foraminal narrowing at multiple levels. Atlantoaxial articulation is within normal. There is no acute fracture or subluxation. Remaining bones soft tissues are within normal.  IMPRESSION: No acute intracranial findings.  Chronic sinus inflammatory change over the right maxillary sinus.  No acute cervical spine injury.  Mild spondylosis of the cervical spine with mild disc disease at C5-6 level and multilevel bilateral neural foraminal narrowing.   Electronically Signed   By: Marin Olp M.D.   On: 05/18/2015 21:59   Ct Cervical Spine Wo Contrast  05/18/2015   CLINICAL DATA:  Walked into a tree hitting central forehead. Moderate headache.  EXAM: CT HEAD WITHOUT CONTRAST  CT CERVICAL SPINE WITHOUT CONTRAST  TECHNIQUE: Multidetector CT imaging of the head and cervical spine was performed following the standard protocol without intravenous contrast.  Multiplanar CT image reconstructions of the cervical spine were also generated.  COMPARISON:  03/27/2014  FINDINGS: CT HEAD FINDINGS  The ventricles, cisterns and other CSF spaces are within normal. There is no mass, mass effect, shift of midline structures or acute hemorrhage. No evidence of acute infarction. There is opacification over the right maxillary sinus. No evidence of fracture.  CT CERVICAL SPINE FINDINGS  Vertebral body alignment and heights are normal. There is mild spondylosis throughout the cervical spine. There is mild disc space narrowing at the C5-6 level. Prevertebral soft tissues are within normal. There is mild uncovertebral joint spurring and moderate facet arthropathy. Bilateral neural foraminal narrowing at multiple levels. Atlantoaxial articulation is within normal. There is no acute fracture or subluxation. Remaining bones soft tissues are within normal.  IMPRESSION: No acute intracranial findings.  Chronic sinus inflammatory change over the right maxillary sinus.  No acute cervical spine injury.  Mild spondylosis of the cervical spine with mild disc disease at C5-6 level and multilevel bilateral neural foraminal narrowing.   Electronically Signed   By: Marin Olp M.D.   On: 05/18/2015 21:59     EKG Interpretation None      MDM   Final diagnoses:  Concussion, without loss of consciousness, initial encounter   Patient has a normal neurologic exam with some neck tenderness. CT's performed given presentation adn mechanism of injury, which are unremarkable. He remains neurologically intact without decline while observed in teh ED. Stable for discharge home.   I personally performed the services described in this documentation, which was scribed in my presence. The recorded information has been reviewed and is accurate.      Charlann Lange, PA-C 05/19/15 0350  Elta Guadeloupe  Jeneen Rinks, MD 05/24/15 [phone]

## 2015-05-18 NOTE — ED Notes (Signed)
Per EMS-has possibly been drinking, walked into tree and now complaining of a headache

## 2015-05-18 NOTE — Discharge Instructions (Signed)
Concussion  A concussion, or closed-head injury, is a brain injury caused by a direct blow to the head or by a quick and sudden movement (jolt) of the head or neck. Concussions are usually not life-threatening. Even so, the effects of a concussion can be serious. If you have had a concussion before, you are more likely to experience concussion-like symptoms after a direct blow to the head.   CAUSES  · Direct blow to the head, such as from running into another player during a soccer game, being hit in a fight, or hitting your head on a hard surface.  · A jolt of the head or neck that causes the brain to move back and forth inside the skull, such as in a car crash.  SIGNS AND SYMPTOMS  The signs of a concussion can be hard to notice. Early on, they may be missed by you, family members, and health care providers. You may look fine but act or feel differently.  Symptoms are usually temporary, but they may last for days, weeks, or even longer. Some symptoms may appear right away while others may not show up for hours or days. Every head injury is different. Symptoms include:  · Mild to moderate headaches that will not go away.  · A feeling of pressure inside your head.  · Having more trouble than usual:  ¨ Learning or remembering things you have heard.  ¨ Answering questions.  ¨ Paying attention or concentrating.  ¨ Organizing daily tasks.  ¨ Making decisions and solving problems.  · Slowness in thinking, acting or reacting, speaking, or reading.  · Getting lost or being easily confused.  · Feeling tired all the time or lacking energy (fatigued).  · Feeling drowsy.  · Sleep disturbances.  ¨ Sleeping more than usual.  ¨ Sleeping less than usual.  ¨ Trouble falling asleep.  ¨ Trouble sleeping (insomnia).  · Loss of balance or feeling lightheaded or dizzy.  · Nausea or vomiting.  · Numbness or tingling.  · Increased sensitivity to:  ¨ Sounds.  ¨ Lights.  ¨ Distractions.  · Vision problems or eyes that tire  easily.  · Diminished sense of taste or smell.  · Ringing in the ears.  · Mood changes such as feeling sad or anxious.  · Becoming easily irritated or angry for little or no reason.  · Lack of motivation.  · Seeing or hearing things other people do not see or hear (hallucinations).  DIAGNOSIS  Your health care provider can usually diagnose a concussion based on a description of your injury and symptoms. He or she will ask whether you passed out (lost consciousness) and whether you are having trouble remembering events that happened right before and during your injury.  Your evaluation might include:  · A brain scan to look for signs of injury to the brain. Even if the test shows no injury, you may still have a concussion.  · Blood tests to be sure other problems are not present.  TREATMENT  · Concussions are usually treated in an emergency department, in urgent care, or at a clinic. You may need to stay in the hospital overnight for further treatment.  · Tell your health care provider if you are taking any medicines, including prescription medicines, over-the-counter medicines, and natural remedies. Some medicines, such as blood thinners (anticoagulants) and aspirin, may increase the chance of complications. Also tell your health care provider whether you have had alcohol or are taking illegal drugs. This information   may affect treatment.  · Your health care provider will send you home with important instructions to follow.  · How fast you will recover from a concussion depends on many factors. These factors include how severe your concussion is, what part of your brain was injured, your age, and how healthy you were before the concussion.  · Most people with mild injuries recover fully. Recovery can take time. In general, recovery is slower in older persons. Also, persons who have had a concussion in the past or have other medical problems may find that it takes longer to recover from their current injury.  HOME  CARE INSTRUCTIONS  General Instructions  · Carefully follow the directions your health care provider gave you.  · Only take over-the-counter or prescription medicines for pain, discomfort, or fever as directed by your health care provider.  · Take only those medicines that your health care provider has approved.  · Do not drink alcohol until your health care provider says you are well enough to do so. Alcohol and certain other drugs may slow your recovery and can put you at risk of further injury.  · If it is harder than usual to remember things, write them down.  · If you are easily distracted, try to do one thing at a time. For example, do not try to watch TV while fixing dinner.  · Talk with family members or close friends when making important decisions.  · Keep all follow-up appointments. Repeated evaluation of your symptoms is recommended for your recovery.  · Watch your symptoms and tell others to do the same. Complications sometimes occur after a concussion. Older adults with a brain injury may have a higher risk of serious complications, such as a blood clot on the brain.  · Tell your teachers, school nurse, school counselor, coach, athletic trainer, or work manager about your injury, symptoms, and restrictions. Tell them about what you can or cannot do. They should watch for:  ¨ Increased problems with attention or concentration.  ¨ Increased difficulty remembering or learning new information.  ¨ Increased time needed to complete tasks or assignments.  ¨ Increased irritability or decreased ability to cope with stress.  ¨ Increased symptoms.  · Rest. Rest helps the brain to heal. Make sure you:  ¨ Get plenty of sleep at night. Avoid staying up late at night.  ¨ Keep the same bedtime hours on weekends and weekdays.  ¨ Rest during the day. Take daytime naps or rest breaks when you feel tired.  · Limit activities that require a lot of thought or concentration. These include:  ¨ Doing homework or job-related  work.  ¨ Watching TV.  ¨ Working on the computer.  · Avoid any situation where there is potential for another head injury (football, hockey, soccer, basketball, martial arts, downhill snow sports and horseback riding). Your condition will get worse every time you experience a concussion. You should avoid these activities until you are evaluated by the appropriate follow-up health care providers.  Returning To Your Regular Activities  You will need to return to your normal activities slowly, not all at once. You must give your body and brain enough time for recovery.  · Do not return to sports or other athletic activities until your health care provider tells you it is safe to do so.  · Ask your health care provider when you can drive, ride a bicycle, or operate heavy machinery. Your ability to react may be slower after a   brain injury. Never do these activities if you are dizzy.  · Ask your health care provider about when you can return to work or school.  Preventing Another Concussion  It is very important to avoid another brain injury, especially before you have recovered. In rare cases, another injury can lead to permanent brain damage, brain swelling, or death. The risk of this is greatest during the first 7-10 days after a head injury. Avoid injuries by:  · Wearing a seat belt when riding in a car.  · Drinking alcohol only in moderation.  · Wearing a helmet when biking, skiing, skateboarding, skating, or doing similar activities.  · Avoiding activities that could lead to a second concussion, such as contact or recreational sports, until your health care provider says it is okay.  · Taking safety measures in your home.  ¨ Remove clutter and tripping hazards from floors and stairways.  ¨ Use grab bars in bathrooms and handrails by stairs.  ¨ Place non-slip mats on floors and in bathtubs.  ¨ Improve lighting in dim areas.  SEEK MEDICAL CARE IF:  · You have increased problems paying attention or  concentrating.  · You have increased difficulty remembering or learning new information.  · You need more time to complete tasks or assignments than before.  · You have increased irritability or decreased ability to cope with stress.  · You have more symptoms than before.  Seek medical care if you have any of the following symptoms for more than 2 weeks after your injury:  · Lasting (chronic) headaches.  · Dizziness or balance problems.  · Nausea.  · Vision problems.  · Increased sensitivity to noise or light.  · Depression or mood swings.  · Anxiety or irritability.  · Memory problems.  · Difficulty concentrating or paying attention.  · Sleep problems.  · Feeling tired all the time.  SEEK IMMEDIATE MEDICAL CARE IF:  · You have severe or worsening headaches. These may be a sign of a blood clot in the brain.  · You have weakness (even if only in one hand, leg, or part of the face).  · You have numbness.  · You have decreased coordination.  · You vomit repeatedly.  · You have increased sleepiness.  · One pupil is larger than the other.  · You have convulsions.  · You have slurred speech.  · You have increased confusion. This may be a sign of a blood clot in the brain.  · You have increased restlessness, agitation, or irritability.  · You are unable to recognize people or places.  · You have neck pain.  · It is difficult to wake you up.  · You have unusual behavior changes.  · You lose consciousness.  MAKE SURE YOU:  · Understand these instructions.  · Will watch your condition.  · Will get help right away if you are not doing well or get worse.  Document Released: 12/22/2003 Document Revised: 10/06/2013 Document Reviewed: 04/23/2013  [employer]® Patient Information ©2015 [employer]. This information is not intended to replace advice given to you by your health care provider. Make sure you discuss any questions you have with your health care provider.

## 2015-05-23 ENCOUNTER — Other Ambulatory Visit: Payer: Self-pay | Admitting: Critical Care Medicine

## 2015-05-24 ENCOUNTER — Ambulatory Visit: Payer: Medicare Other | Admitting: Physician Assistant

## 2015-06-02 ENCOUNTER — Telehealth: Payer: Self-pay | Admitting: Physician Assistant

## 2015-06-02 NOTE — Telephone Encounter (Signed)
Charge. 

## 2015-06-02 NOTE — Telephone Encounter (Signed)
Pt was no show 05/24/15 11:30am, hospital f/u, left msg for pt to call and reschedule, charge no show?

## 2015-06-21 ENCOUNTER — Encounter: Payer: Self-pay | Admitting: Physician Assistant

## 2015-06-21 ENCOUNTER — Ambulatory Visit (INDEPENDENT_AMBULATORY_CARE_PROVIDER_SITE_OTHER): Payer: Medicare Other | Admitting: Physician Assistant

## 2015-06-21 VITALS — BP 110/72 | HR 62 | Temp 97.8°F | Resp 16 | Ht 73.0 in | Wt 212.5 lb

## 2015-06-21 DIAGNOSIS — R351 Nocturia: Secondary | ICD-10-CM

## 2015-06-21 DIAGNOSIS — L989 Disorder of the skin and subcutaneous tissue, unspecified: Secondary | ICD-10-CM | POA: Diagnosis not present

## 2015-06-21 DIAGNOSIS — Z8546 Personal history of malignant neoplasm of prostate: Secondary | ICD-10-CM | POA: Diagnosis not present

## 2015-06-21 DIAGNOSIS — Z23 Encounter for immunization: Secondary | ICD-10-CM

## 2015-06-21 DIAGNOSIS — G4733 Obstructive sleep apnea (adult) (pediatric): Secondary | ICD-10-CM

## 2015-06-21 MED ORDER — CITALOPRAM HYDROBROMIDE 10 MG PO TABS: 10.0000 mg | ORAL_TABLET | Freq: Every day | ORAL | Status: DC

## 2015-06-21 MED ORDER — ONDANSETRON 8 MG PO TBDP: ORAL_TABLET | ORAL | Status: DC

## 2015-06-21 MED ORDER — TAMSULOSIN HCL 0.4 MG PO CAPS: 0.4000 mg | ORAL_CAPSULE | Freq: Every day | ORAL | Status: DC

## 2015-06-21 MED ORDER — NITROGLYCERIN 0.4 MG SL SUBL: 0.4000 mg | SUBLINGUAL_TABLET | SUBLINGUAL | Status: AC | PRN

## 2015-06-21 NOTE — Patient Instructions (Addendum)
Please keep your phone on -- You will be contacted for assessment by Urology (Dr. Jeffie Pollock) and Dermatology (Dr. Melina Copa).  Please call the number for counseling to set up an appointment. Please take the Citalopram daily as directed. Continue Xanax as needed. Follow-up in 1 month.  Please set your acid refluxes in a place where you will remember to take them.  Avoid late night eating. Follow-up with your GI specialist as scheduled.  Restart Flomax daily to help with urinary symptoms.  I will contact you once I have spoken with [employer].

## 2015-06-21 NOTE — Progress Notes (Signed)
Patient presents to clinic today c/o worsening anxiety and irritability with panic attacks occuring every couple of days. Endorses increased stressors at home. Finding he is becoming easily angered and does not want to take it out on someone he loves. Denies SI/HI. Endorses some depressed mood and anhedonia. Is taking Xanax only PRN. Has not followed up with counseling as discussed at last visit.  Patient also requesting referral back to Urology as he states Nocturia is returning. Denies daytime symptoms. Denies dysuria. Is no longer on Flomax as he was previously. Has been followed by Dr. Jeffie Pollock.  Patient also c/o sore on the top of his head x 4 months that is non-healing and sometimes bleeds. Denies trauma or injury to site. Wears hat daily. Denies hx of skin cancer.  Patient with history of asthma and COPD, is on nighttime oxygen. Is requesting portable oxygen to use at night time if he has to be out of town for a couple of nights. Endorses with post-polio syndrome and back issues his home O2 concentrator is too much to lift or handle. Does not wear oxygen while at rest.  Patient also due for Pneumovax and is requesting to have done at today's visit.  Past Medical History  Diagnosis Date  . Hypertension   . Prostate cancer   . GERD (gastroesophageal reflux disease)   . History of TB (tuberculosis)     1984--  hospitalized for 4 month treatment  . Post-polio syndrome     polio at age 5--PT WAS IN IRON LUNG; PT WAS IN W/C UNTIL AGE 73; STILL HAS WEAKNESS RIGHT SIDE  . History of chronic bronchitis   . History of rheumatic fever   . Bladder outlet obstruction   . Anxiety disorder   . History of urinary retention   . Nocturnal oxygen desaturation     USES O2 NIGHTLY  . Prostate cancer   . Complication of anesthesia     DIFFICULT WAKING   . Diabetes mellitus without complication     BODERLINE - DIET CONTROL  . Anxiety   . Shortness of breath dyspnea     RIGHT HEMIDIAPHRAGM  ELEVATION - POST POLIO SYNDROME  . Dysrhythmia     PVC'S  . Arthritis     BILATERAL SHOULDERS, ELBOWS AND HANDS AND LEFT HIP AND KNEES--HX CORTISONE SHOTS IN SHOULDERS, ELBOWS, HIP AND KNEES  . History of oxygen administration     oxygen use 2 l/m nasally at bedtime and exertional occasions  . COPD, frequent exacerbations     pulmologist-  dr Joya Gaskins--  Girtha Rm Stage C.04-25-15 recent COPD exacerbation-much improved now, after tx. in ER [facility].  Marland Kitchen PONV (postoperative nausea and vomiting)     Current Outpatient Prescriptions on File Prior to Visit  Medication Sig Dispense Refill  . albuterol (PROVENTIL HFA;VENTOLIN HFA) 108 (90 BASE) MCG/ACT inhaler Inhale 2 puffs into the lungs every 6 (six) hours as needed for wheezing or shortness of breath. 3 Inhaler 3  . ALPRAZolam (XANAX) 1 MG tablet Take 0.5 tablets (0.5 mg total) by mouth 3 (three) times daily as needed for anxiety. 90 tablet 0  . amLODipine (NORVASC) 10 MG tablet TAKE 1 TABLET BY MOUTH ONCE DAILY 90 tablet 1  . BREO ELLIPTA 100-25 MCG/INH AEPB Inhale 1 puff into the lungs every morning. daily 90 each 1  . calcium carbonate (TUMS - DOSED IN MG ELEMENTAL CALCIUM) 500 MG chewable tablet Chew 1 tablet by mouth 3 (three) times daily as needed for  indigestion or heartburn.    . colchicine 0.6 MG tablet Take 2 tablets by mouth.  Take another tablet in 1 hour.  Can repeat in 3 days. 6 tablet 3  . famotidine (PEPCID) 20 MG tablet Take 20 mg by mouth 2 (two) times daily.    Marland Kitchen loratadine (CLARITIN) 10 MG tablet TAKE 1 TABLET BY MOUTH EVERY DAY 30 tablet 5  . metoprolol succinate (TOPROL-XL) 25 MG 24 hr tablet TAKE 1 TABLET BY MOUTH ONCE DAILY 30 tablet 3  . montelukast (SINGULAIR) 10 MG tablet TAKE 1 TABLET (10 MG TOTAL) BY MOUTH AT BEDTIME. 30 tablet 2  . oxyCODONE-acetaminophen (PERCOCET/ROXICET) 5-325 MG per tablet Take 1-2 tablets by mouth every 6 (six) hours as needed for severe pain. 15 tablet 0  . OXYGEN Inhale 2 L into the lungs at  bedtime.    Marland Kitchen oxymetazoline (AFRIN) 0.05 % nasal spray Place 1-2 sprays into both nostrils daily as needed for congestion.    . predniSONE (DELTASONE) 20 MG tablet Take 20 mg by mouth daily as needed (tightness in chest).     . promethazine (PHENERGAN) 12.5 MG tablet Take 1 tablet (12.5 mg total) by mouth every 8 (eight) hours as needed for nausea or vomiting. 30 tablet 0  . tiotropium (SPIRIVA) 18 MCG inhalation capsule Place 1 capsule (18 mcg total) into inhaler and inhale daily. IN AM 90 capsule 1   No current facility-administered medications on file prior to visit.    Allergies  Allergen Reactions  . Ivp Dye [Iodinated Diagnostic Agents] Anaphylaxis  . Levaquin [Levofloxacin In D5w] Shortness Of Breath    In addition: sweating, chest pain, and diarrhea.   . Penicillins Anaphylaxis    Heart stops  . Aspirin Other (See Comments)    Reaction unknown  . Morphine And Related Nausea And Vomiting    Can take with zofran   . Nsaids Other (See Comments)    Difficulty breathing    Family History  Problem Relation Age of Onset  . Alzheimer's disease Father 4    Deceased  . Stomach cancer Father   . Heart attack Father   . Heart disease Father   . Skin cancer Mother     Facial-Living  . Alcohol abuse Sister     x2  . Mental illness Sister     x2  . Diabetes Maternal Aunt     x2  . Thyroid disease Maternal Aunt     x4  . Diabetes Maternal Uncle   . Tuberculosis Paternal Grandfather   . Tuberculosis Paternal Grandmother   . Alzheimer's disease Paternal Aunt   . Alzheimer's disease Paternal Uncle   . Colon cancer Neg Hx   . Colon polyps Neg Hx   . Crohn's disease Neg Hx   . Ulcerative colitis Neg Hx     Social History   Social History  . Marital Status: Married    Spouse Name: N/A  . Number of Children: N/A  . Years of Education: N/A   Occupational History  . Retired    Social History Main Topics  . Smoking status: Former Smoker -- 43 years    Quit date:  08/25/2013  . Smokeless tobacco: Never Used     Comment: hx  smoke a pack of pipe tobacco(1.5oz) every 10 days.   . Alcohol Use: 7.2 oz/week    12 Cans of beer per week     Comment: average 3 beers per day  . Drug Use: No  .  Sexual Activity: Not Asked   Other Topics Concern  . None   Social History Narrative    Review of Systems - See HPI.  All other ROS are negative.  BP 110/72 mmHg  Pulse 62  Temp(Src) 97.8 F (36.6 C) (Oral)  Resp 16  Ht _0  (1.854 m)  Wt 212 lb 8 oz (96.389 kg)  BMI 28.04 kg/m2  SpO2 98%  Physical Exam  Constitutional: He is oriented to person, place, and time and well-developed, well-nourished, and in no distress.  HENT:  Head: Normocephalic and atraumatic.  Eyes: Conjunctivae are normal.  Cardiovascular: Normal rate, regular rhythm, normal heart sounds and intact distal pulses.   Pulmonary/Chest: Effort normal and breath sounds normal. No respiratory distress. He has no wheezes. He has no rales. He exhibits no tenderness.  Neurological: He is alert and oriented to person, place, and time.  Skin: Skin is warm and dry.     Psychiatric: Affect normal.  Vitals reviewed.   Recent Results (from the past 2160 hour(s))  Basic metabolic panel     Status: None   Collection Time: 04/25/15 10:29 PM  Result Value Ref Range   Sodium 139 135 - 145 mmol/L   Potassium 3.6 3.5 - 5.1 mmol/L   Chloride 103 101 - 111 mmol/L   CO2 26 22 - 32 mmol/L   Glucose, Bld 92 65 - 99 mg/dL   BUN 18 6 - 20 mg/dL   Creatinine, Ser 1.22 0.61 - 1.24 mg/dL   Calcium 9.5 8.9 - 10.3 mg/dL   GFR calc non Af Amer >60 >60 mL/min   GFR calc Af Amer >60 >60 mL/min    Comment: (NOTE) The eGFR has been calculated using the CKD EPI equation. This calculation has not been validated in all clinical situations. eGFR's persistently <60 mL/min signify possible Chronic Kidney Disease.    Anion gap 10 5 - 15  CBC with Differential     Status: Abnormal   Collection Time: 04/25/15  10:29 PM  Result Value Ref Range   WBC 5.8 4.0 - 10.5 K/uL   RBC 4.58 4.22 - 5.81 MIL/uL   Hemoglobin 14.7 13.0 - 17.0 g/dL   HCT 43.2 39.0 - 52.0 %   MCV 94.3 78.0 - 100.0 fL   MCH 32.1 26.0 - 34.0 pg   MCHC 34.0 30.0 - 36.0 g/dL   RDW 13.2 11.5 - 15.5 %   Platelets 216 150 - 400 K/uL   Neutrophils Relative % 53 43 - 77 %   Neutro Abs 3.1 1.7 - 7.7 K/uL   Lymphocytes Relative 32 12 - 46 %   Lymphs Abs 1.8 0.7 - 4.0 K/uL   Monocytes Relative 9 3 - 12 %   Monocytes Absolute 0.5 0.1 - 1.0 K/uL   Eosinophils Relative 6 (H) 0 - 5 %   Eosinophils Absolute 0.3 0.0 - 0.7 K/uL   Basophils Relative 0 0 - 1 %   Basophils Absolute 0.0 0.0 - 0.1 K/uL  Troponin I     Status: None   Collection Time: 04/25/15 10:29 PM  Result Value Ref Range   Troponin I <0.03 <0.031 ng/mL    Comment:        NO INDICATION OF MYOCARDIAL INJURY.     Assessment/Plan: Nocturia Flomax restarted. Referral placed back to Dr. Jeffie Pollock per patient preference. Patient to call and schedule appointment.  Need for prophylactic vaccination against Streptococcus pneumoniae (pneumococcus) Immunization given by nursing staff  Non-healing skin lesion Concern  for SCC. Referral placed to Dermatology.  OSA (obstructive sleep apnea) Currently on 2L O2 at night at home with nasal cannula. Will order portable oxygen for patient when is out of town as he is unable to transport home concentrator due to significant MSK history including post-polio syndrome.

## 2015-06-21 NOTE — Progress Notes (Signed)
Pre visit review using our clinic review tool, if applicable. No additional management support is needed unless otherwise documented below in the visit note/SLS  

## 2015-06-22 ENCOUNTER — Telehealth: Payer: Self-pay | Admitting: *Deleted

## 2015-06-22 NOTE — Telephone Encounter (Signed)
FYI- forwarded to Dr. Larose Kells as Einar Pheasant is out of office.  Patient called stating that he had a pneumonia shot yesterday and had a headache after the injection.  He states that overnight, he had a burning sensation in his lungs.  His lungs felt like they were "on fire."  Pain was over entire ribcage.  He woke up this morning and the lung burning had resolved, but the headache persists.  He would like to know if it was related to pneumonia injection.  Notified patient that this was not a typical reaction, but he could be seen in office.  He denies SOB, radiating pain, numbness/tingling, dizziness.  He states that the pain was throughout his ribcage and is not occuring currently.  Offered patient appointment but he declined stating lung pain was gone and he would take an Excedrin for headache.

## 2015-06-22 NOTE — Telephone Encounter (Signed)
I agree with your advice, recommend OTCs were headaches, call if not better. Hard to say if headache related to pneumonia shot.

## 2015-06-24 ENCOUNTER — Telehealth: Payer: Self-pay | Admitting: Physician Assistant

## 2015-06-24 NOTE — Telephone Encounter (Signed)
Caller name: Jeani Hawking from [facility] Call back number: [phone]    Reason for call:  In need of office notes supporting oxygen support

## 2015-06-27 ENCOUNTER — Telehealth: Payer: Self-pay | Admitting: Physician Assistant

## 2015-06-27 DIAGNOSIS — Z8546 Personal history of malignant neoplasm of prostate: Secondary | ICD-10-CM | POA: Insufficient documentation

## 2015-06-27 DIAGNOSIS — R351 Nocturia: Secondary | ICD-10-CM | POA: Insufficient documentation

## 2015-06-27 DIAGNOSIS — L989 Disorder of the skin and subcutaneous tissue, unspecified: Secondary | ICD-10-CM | POA: Insufficient documentation

## 2015-06-27 DIAGNOSIS — Z23 Encounter for immunization: Secondary | ICD-10-CM | POA: Insufficient documentation

## 2015-06-27 NOTE — Telephone Encounter (Signed)
Note is open; will verify ready for forwarding/SLS

## 2015-06-27 NOTE — Assessment & Plan Note (Signed)
Immunization given by nursing staff. 

## 2015-06-27 NOTE — Assessment & Plan Note (Signed)
Flomax restarted. Referral placed back to Dr. Jeffie Pollock per patient preference. Patient to call and schedule appointment.

## 2015-06-27 NOTE — Assessment & Plan Note (Signed)
Concern for SCC. Referral placed to Dermatology.

## 2015-06-27 NOTE — Telephone Encounter (Signed)
OV note completed and forwarded via fax to [facility] with order form/SLS

## 2015-06-27 NOTE — Telephone Encounter (Signed)
Caller name: Marcie Relation to MD:[id number]  Call back number: [phone]    Reason for call:  As per [employer] will not cover  portable oxygen due to patient only needing oxygen at night while he is sleep. [facility] also stated they no longer provide oxygen for this patient.

## 2015-06-27 NOTE — Telephone Encounter (Signed)
Please inform patient that [employer] would not approve his portable oxygen.

## 2015-06-27 NOTE — Assessment & Plan Note (Signed)
Currently on 2L O2 at night at home with nasal cannula. Will order portable oxygen for patient when is out of town as he is unable to transport home concentrator due to significant MSK history including post-polio syndrome.

## 2015-06-27 NOTE — Telephone Encounter (Signed)
Patient informed and stated he will purchase his own oxygen machine

## 2015-06-29 NOTE — Telephone Encounter (Signed)
Patient has had prior walk test without desaturation. Unfortunately he is not a candidate for portable oxygen. Please inform patient.

## 2015-06-29 NOTE — Telephone Encounter (Signed)
Caller name: Jeani Hawking from [facility] Can be reached: [phone]   Reason for call: Info sent was for overnight oximetry testing. Which qualifies pt for nighttime oxygen only (concentrator). To qualify for portable oxygen pt needs o2 sat of 88% or less on RA at rest. OR pt needs 6 minute walk test including 3 results (1. o2 sat on RA, 2. o2 sat on RA after walking with sat of 88% or less, 3. Then apply o2 and get 3rd o2 sat)

## 2015-07-01 NOTE — Telephone Encounter (Signed)
Patient informed, understood & agreed [name] he will purchase it]; patient reports that he has not heard from Dermatology referral, will check with [facility]. Also, patient states he has called Psychiatry to get appt with Clint Bolder or Marya Amsler for counseling therapy at our office [3] times and was told not to call back again; will address with provider/SLS

## 2015-07-05 ENCOUNTER — Other Ambulatory Visit: Payer: Self-pay | Admitting: Physician Assistant

## 2015-07-07 ENCOUNTER — Ambulatory Visit: Payer: Self-pay | Admitting: Adult Health

## 2015-07-12 ENCOUNTER — Telehealth: Payer: Self-pay | Admitting: Physician Assistant

## 2015-07-12 MED ORDER — OXYCODONE-ACETAMINOPHEN 5-325 MG PO TABS: 1.0000 | ORAL_TABLET | Freq: Four times a day (QID) | ORAL | Status: DC | PRN

## 2015-07-12 NOTE — Telephone Encounter (Signed)
Refill granted.  Rx at front desk for pickup. 

## 2015-07-12 NOTE — Telephone Encounter (Signed)
Relation to LS:LHTD Call back number:[phone] Pharmacy:  [id number] - [place], [place]. [phone] (Phone) [phone] (Fax)         Reason for call:  Patient requesting a refill oxyCODONE-acetaminophen (PERCOCET/ROXICET) 5-325 MG per tablet

## 2015-07-14 ENCOUNTER — Encounter: Payer: Self-pay | Admitting: Adult Health

## 2015-07-14 ENCOUNTER — Ambulatory Visit (INDEPENDENT_AMBULATORY_CARE_PROVIDER_SITE_OTHER): Payer: Medicare Other | Admitting: Adult Health

## 2015-07-14 VITALS — BP 123/81 | HR 62 | Temp 97.9°F | Ht 72.0 in | Wt 213.0 lb

## 2015-07-14 DIAGNOSIS — J449 Chronic obstructive pulmonary disease, unspecified: Secondary | ICD-10-CM | POA: Diagnosis not present

## 2015-07-14 NOTE — Progress Notes (Signed)
   Subjective:    Patient ID: [name], male    DOB: Feb 15, 1951, 64 y.o.   MRN: [id number]  HPI 64 yo former smoker with COPD , former smoker   TEST :  [name] 07/08/14: FeV1 51% FeV1/FVC 66% FVC 59% 10/5/2015ONO RA was normal  10/13/2014  ONO on RA NORMAL  07/14/2015 Follow up COPD :  Pt returns for 3 month follow up .  Says overall he is doing well, has good and bad days.  Has a lot of mucus in mornings , variety of colors with clear, white to yellow .  Throughout day is clear. No fever.  No increased wheezing. Does use albuterol neb that help.  No chest pain,orthopnea, edema , hemoptysis, or fever. .   Last cxr 12/2014 with COPD changes.  Flu shot , PVX and Prevnar utd   Says he taking BREO and Spiriva on reg basis .  Previously went to pulmonary rehab. But had to stop, would like to restart.     Review of Systems  Constitutional:   No  weight loss, night sweats,  Fevers, chills, fatigue, or  lassitude.  HEENT:   No headaches,  Difficulty swallowing,  Tooth/dental problems, or  Sore throat,                No sneezing, itching, ear ache, +nasal congestion, post nasal drip,   CV:  No chest pain,  Orthopnea, PND, swelling in lower extremities, anasarca, dizziness, palpitations, syncope.   GI  No heartburn, indigestion, abdominal pain, nausea, vomiting, diarrhea, change in bowel habits, loss of appetite, bloody stools.   Resp: .  No chest wall deformity  Skin: no rash or lesions.  GU: no dysuria, change in color of urine, no urgency or frequency.  No flank pain, no hematuria   MS:  No joint pain or swelling.  No decreased range of motion.  No back pain.  Psych:  No change in mood or affect. No depression or anxiety.  No memory loss.          Objective:   Physical Exam GEN: A/Ox3; pleasant , NAD ,   HEENT:  [place]/AT,  EACs-clear, TMs-wnl, NOSE-clear, THROAT-clear, no lesions, no postnasal drip or exudate noted.   NECK:  Supple w/ fair ROM; no JVD; normal  carotid impulses w/o bruits; no thyromegaly or nodules palpated; no lymphadenopathy.  RESP  Decreased BS , no wheezing .no accessory muscle use, no dullness to percussion  CARD:  RRR, no m/r/g  , no peripheral edema, pulses intact, no cyanosis or clubbing.  GI:   Soft & nt; nml bowel sounds; no organomegaly or masses detected.  Musco: Warm bil, no deformities or joint swelling noted.   Neuro: alert, no focal deficits noted.    Skin: Warm, no lesions or rashes         Assessment & Plan:

## 2015-07-14 NOTE — Assessment & Plan Note (Signed)
Compensated without flare  Mild Allergic Rhinitis symptoms  Will send order to restart pulm rehab   Plan  May use Allegra 180mg  daily As needed  Drainage.  Saline nasal rinses As needed   Mucinex DM Twice daily  As needed  Cough/congestion  Continue on BREO and Spiriva .  Follow up Dr. Elsworth Soho   In 3 months and As needed

## 2015-07-14 NOTE — Progress Notes (Signed)
Reviewed & agree with plan  

## 2015-07-14 NOTE — Patient Instructions (Signed)
May use Allegra 180mg  daily As needed  Drainage.  Saline nasal rinses As needed   Mucinex DM Twice daily  As needed  Cough/congestion  Continue on BREO and Spiriva .  Follow up Dr. Elsworth Soho   In 3 months and As needed

## 2015-07-26 ENCOUNTER — Encounter: Payer: Self-pay | Admitting: Physician Assistant

## 2015-07-26 ENCOUNTER — Ambulatory Visit (INDEPENDENT_AMBULATORY_CARE_PROVIDER_SITE_OTHER): Payer: Medicare Other | Admitting: Physician Assistant

## 2015-07-26 VITALS — BP 123/70 | HR 59 | Temp 97.7°F | Resp 16 | Ht 73.0 in | Wt 214.2 lb

## 2015-07-26 DIAGNOSIS — G8929 Other chronic pain: Secondary | ICD-10-CM

## 2015-07-26 DIAGNOSIS — J310 Chronic rhinitis: Secondary | ICD-10-CM | POA: Diagnosis not present

## 2015-07-26 DIAGNOSIS — N3281 Overactive bladder: Secondary | ICD-10-CM | POA: Diagnosis not present

## 2015-07-26 DIAGNOSIS — R0981 Nasal congestion: Secondary | ICD-10-CM

## 2015-07-26 DIAGNOSIS — T485X1A Poisoning by other anti-common-cold drugs, accidental (unintentional), initial encounter: Secondary | ICD-10-CM

## 2015-07-26 DIAGNOSIS — S60459A Superficial foreign body of unspecified finger, initial encounter: Secondary | ICD-10-CM | POA: Diagnosis not present

## 2015-07-26 DIAGNOSIS — M25569 Pain in unspecified knee: Secondary | ICD-10-CM | POA: Diagnosis not present

## 2015-07-26 DIAGNOSIS — T485X5A Adverse effect of other anti-common-cold drugs, initial encounter: Secondary | ICD-10-CM

## 2015-07-26 MED ORDER — MIRABEGRON ER 25 MG PO TB24: 25.0000 mg | ORAL_TABLET | Freq: Every day | ORAL | Status: DC

## 2015-07-26 MED ORDER — OXYCODONE HCL 15 MG PO TABS: 15.0000 mg | ORAL_TABLET | Freq: Three times a day (TID) | ORAL | Status: DC | PRN

## 2015-07-26 NOTE — Patient Instructions (Addendum)
Please go to the Urology appointment as scheduled. You will be contacted for assessment by Dermatology.  Please start the new pain medication as directed. I am looking into an all-day long medication but will need to go through insurance so they will pay for it.  Continue Flomax but add-on the Myrbetriq daily.  Call and let me know how symptoms are doing.  Please keep the finger clean and dry. Apply Triple antibiotic ointment to the area as it heals. Call if you notice any redness, pain, drainage.

## 2015-07-26 NOTE — Progress Notes (Signed)
Pre visit review using our clinic review tool, if applicable. No additional management support is needed unless otherwise documented below in the visit note/SLS  

## 2015-07-27 ENCOUNTER — Telehealth: Payer: Self-pay | Admitting: Physician Assistant

## 2015-07-27 DIAGNOSIS — T485X5A Adverse effect of other anti-common-cold drugs, initial encounter: Secondary | ICD-10-CM | POA: Insufficient documentation

## 2015-07-27 DIAGNOSIS — J310 Chronic rhinitis: Secondary | ICD-10-CM | POA: Insufficient documentation

## 2015-07-27 DIAGNOSIS — N3281 Overactive bladder: Secondary | ICD-10-CM | POA: Insufficient documentation

## 2015-07-27 DIAGNOSIS — S60459A Superficial foreign body of unspecified finger, initial encounter: Secondary | ICD-10-CM | POA: Insufficient documentation

## 2015-07-27 DIAGNOSIS — R0981 Nasal congestion: Secondary | ICD-10-CM | POA: Insufficient documentation

## 2015-07-27 NOTE — Assessment & Plan Note (Signed)
Area prepped with betadine. 25 gauge needle tip used to uncover top layers of epidermis. Pressure applied and a 2 cm long thin splinter removed from finger. Area was flushed with sterile saline to clean and ensure there was no retained foreign body. TAO applied and bandaged applied. Wound care discussed with patient.

## 2015-07-27 NOTE — Telephone Encounter (Signed)
Had recommended the Myrbetriq due to low side effect profile. Other options include Ditropan which is for OAB but may cause dry mouth and constipation. He has history of constipation. We have two options, we can try the Ditropan or we can wait for his Urology appointment with Dr. Jeffie Pollock. I am fine with either option.

## 2015-07-27 NOTE — Progress Notes (Signed)
Patient presents to clinic today with multiple complaints.  Patient complains of significant nasal congestion over the past several weeks. Has been using Afrin nasal spray 2-3 times daily for the past 2 weeks. Endorses some relief of symptoms initially but then returns. Denies relief with his daily claritin. Denies sinus pressure, sinus pain or cough. Denies fever, chills.  Patient also c/o continued urinary urgency and frequency during the day. Is taking Flomax which he states helped initially with nighttime urination but now symptoms are more frequent. Is s/p prostatectomy. Denies hematuria or dysuria. Has upcoming follow-up with Dr. Jeffie Pollock.   Patient with concern of a splinter stuck deep in his 2nd digit of left hand which has been present x 2 weeks. States he was unable to remove himself at home. Endorses some pus draining from site but denies fever, chills. Notes mild tenderness.  Patient with history of OA of multiple joints as well as post-polio syndrome endorses arthritic pain is not being managed by Hydrocodone. Has seen multiple specialist that recommend injections and surgical interventions but patient is scared of surgery and is wanting to manage in other ways.  Patient requesting referral to new Dermatology for routine care -- Is requesting [facility]. Is also requesting a new handicap placard.  Past Medical History  Diagnosis Date  . Hypertension   . Prostate cancer ([place])   . GERD (gastroesophageal reflux disease)   . History of TB (tuberculosis)     1984--  hospitalized for 4 month treatment  . Post-polio syndrome     polio at age 57--PT WAS IN IRON LUNG; PT WAS IN W/C UNTIL AGE 59; STILL HAS WEAKNESS RIGHT SIDE  . History of chronic bronchitis   . History of rheumatic fever   . Bladder outlet obstruction   . Anxiety disorder   . History of urinary retention   . Nocturnal oxygen desaturation     USES O2 NIGHTLY  . Prostate cancer ([place])   . Complication of  anesthesia     DIFFICULT WAKING   . Diabetes mellitus without complication ([place])     BODERLINE - DIET CONTROL  . Anxiety   . Shortness of breath dyspnea     RIGHT HEMIDIAPHRAGM ELEVATION - POST POLIO SYNDROME  . Dysrhythmia     PVC'S  . Arthritis     BILATERAL SHOULDERS, ELBOWS AND HANDS AND LEFT HIP AND KNEES--HX CORTISONE SHOTS IN SHOULDERS, ELBOWS, HIP AND KNEES  . History of oxygen administration     oxygen use 2 l/m nasally at bedtime and exertional occasions  . COPD, frequent exacerbations ([place])     pulmologist-  dr Joya Gaskins--  Girtha Rm Stage C.04-25-15 recent COPD exacerbation-much improved now, after tx. in ER [facility].  Marland Kitchen PONV (postoperative nausea and vomiting)     Current Outpatient Prescriptions on File Prior to Visit  Medication Sig Dispense Refill  . albuterol (PROVENTIL HFA;VENTOLIN HFA) 108 (90 BASE) MCG/ACT inhaler Inhale 2 puffs into the lungs every 6 (six) hours as needed for wheezing or shortness of breath. 3 Inhaler 3  . ALPRAZolam (XANAX) 1 MG tablet Take 0.5 tablets (0.5 mg total) by mouth 3 (three) times daily as needed for anxiety. 90 tablet 0  . amLODipine (NORVASC) 10 MG tablet TAKE 1 TABLET BY MOUTH ONCE DAILY 90 tablet 1  . BREO ELLIPTA 100-25 MCG/INH AEPB Inhale 1 puff into the lungs every morning. daily 90 each 1  . calcium carbonate (TUMS - DOSED IN MG ELEMENTAL CALCIUM) 500 MG chewable  tablet Chew 1 tablet by mouth 3 (three) times daily as needed for indigestion or heartburn.    . citalopram (CELEXA) 10 MG tablet Take 1 tablet (10 mg total) by mouth daily. 30 tablet 1  . colchicine 0.6 MG tablet Take 2 tablets by mouth.  Take another tablet in 1 hour.  Can repeat in 3 days. 6 tablet 3  . famotidine (PEPCID) 20 MG tablet Take 20 mg by mouth 2 (two) times daily.    Marland Kitchen loratadine (CLARITIN) 10 MG tablet TAKE 1 TABLET BY MOUTH EVERY DAY 30 tablet 5  . metoprolol succinate (TOPROL-XL) 25 MG 24 hr tablet TAKE 1 TABLET BY MOUTH ONCE DAILY 30 tablet 3  . montelukast  (SINGULAIR) 10 MG tablet TAKE 1 TABLET (10 MG TOTAL) BY MOUTH AT BEDTIME. 30 tablet 2  . nitroGLYCERIN (NITROSTAT) 0.4 MG SL tablet Place 1 tablet (0.4 mg total) under the tongue every 5 (five) minutes as needed for chest pain. 30 tablet 3  . ondansetron (ZOFRAN ODT) 8 MG disintegrating tablet 8mg  ODT q4 hours prn nausea 6 tablet 0  . OXYGEN Inhale 2 L into the lungs at bedtime.    Marland Kitchen oxymetazoline (AFRIN) 0.05 % nasal spray Place 1-2 sprays into both nostrils daily as needed for congestion.    . predniSONE (DELTASONE) 20 MG tablet Take 20 mg by mouth daily as needed (tightness in chest).     . promethazine (PHENERGAN) 12.5 MG tablet Take 1 tablet (12.5 mg total) by mouth every 8 (eight) hours as needed for nausea or vomiting. 30 tablet 0  . tamsulosin (FLOMAX) 0.4 MG CAPS capsule Take 1 capsule (0.4 mg total) by mouth daily. 30 capsule 3  . tiotropium (SPIRIVA) 18 MCG inhalation capsule Place 1 capsule (18 mcg total) into inhaler and inhale daily. IN AM 90 capsule 1   No current facility-administered medications on file prior to visit.    Allergies  Allergen Reactions  . Ivp Dye [Iodinated Diagnostic Agents] Anaphylaxis  . Levaquin [Levofloxacin In D5w] Shortness Of Breath    In addition: sweating, chest pain, and diarrhea.   . Penicillins Anaphylaxis    Heart stops  . Aspirin Other (See Comments)    Reaction unknown  . Morphine And Related Nausea And Vomiting    Can take with zofran   . Nsaids Other (See Comments)    Difficulty breathing    Family History  Problem Relation Age of Onset  . Alzheimer's disease Father 4    Deceased  . Stomach cancer Father   . Heart attack Father   . Heart disease Father   . Skin cancer Mother     Facial-Living  . Alcohol abuse Sister     x2  . Mental illness Sister     x2  . Diabetes Maternal Aunt     x2  . Thyroid disease Maternal Aunt     x4  . Diabetes Maternal Uncle   . Tuberculosis Paternal Grandfather   . Tuberculosis Paternal  Grandmother   . Alzheimer's disease Paternal Aunt   . Alzheimer's disease Paternal Uncle   . Colon cancer Neg Hx   . Colon polyps Neg Hx   . Crohn's disease Neg Hx   . Ulcerative colitis Neg Hx     Social History   Social History  . Marital Status: Married    Spouse Name: N/A  . Number of Children: N/A  . Years of Education: N/A   Occupational History  . Retired  Social History Main Topics  . Smoking status: Former Smoker -- 22 years    Quit date: 08/25/2013  . Smokeless tobacco: Never Used     Comment: hx  smoke a pack of pipe tobacco(1.5oz) every 10 days.   . Alcohol Use: 7.2 oz/week    12 Cans of beer per week     Comment: average 3 beers per day  . Drug Use: No  . Sexual Activity: Not Asked   Other Topics Concern  . None   Social History Narrative   Review of Systems - See HPI.  All other ROS are negative.  BP 123/70 mmHg  Pulse 59  Temp(Src) 97.7 F (36.5 C) (Oral)  Resp 16  Ht 6\' 1"  (1.854 m)  Wt 214 lb 4 oz (97.183 kg)  BMI 28.27 kg/m2  SpO2 99%  Physical Exam  Constitutional: He is oriented to person, place, and time and well-developed, well-nourished, and in no distress.  HENT:  Head: Normocephalic and atraumatic.  Eyes: Conjunctivae are normal.  Neck: Neck supple.  Cardiovascular: Normal rate, regular rhythm, normal heart sounds and intact distal pulses.   Pulmonary/Chest: Effort normal and breath sounds normal. No respiratory distress. He has no wheezes. He has no rales. He exhibits no tenderness.  Neurological: He is alert and oriented to person, place, and time.  Skin: Skin is warm and dry.     Vitals reviewed.   No results found for this or any previous visit (from the past 2160 hour(s)).  Assessment/Plan: Chronic knee pain Will Rx Oxycodone 15 mg TID to use for severe OA. Topical Apercreme or Icy Hot recommended. Stretching will be beneficial. Encouraged patient to reconsider referral back to Ortho.  OAB (overactive bladder) Keep  follow-up with Urology. Urine dip unremarkable. Will add-on Myrbetriq to improve symptoms.   Nasal congestion due to prolonged use of decongestants Discussed rebound congestion with prolonged use of Afrin. Stop Afrin. Begin saline nasal spray twice daily and Flonase once daily over next 2 weeks. Follow-up if symptoms are not resolving.  Foreign body in skin of finger Area prepped with betadine. 25 gauge needle tip used to uncover top layers of epidermis. Pressure applied and a 2 cm long thin splinter removed from finger. Area was flushed with sterile saline to clean and ensure there was no retained foreign body. TAO applied and bandaged applied. Wound care discussed with patient.

## 2015-07-27 NOTE — Telephone Encounter (Signed)
Pt cannot afford the Myrbetriq. It is $150 after insurance. He would like you to send in something different.

## 2015-07-27 NOTE — Assessment & Plan Note (Signed)
Discussed rebound congestion with prolonged use of Afrin. Stop Afrin. Begin saline nasal spray twice daily and Flonase once daily over next 2 weeks. Follow-up if symptoms are not resolving.

## 2015-07-27 NOTE — Telephone Encounter (Signed)
Called the patient left msg. To call back 

## 2015-07-27 NOTE — Assessment & Plan Note (Signed)
Will Rx Oxycodone 15 mg TID to use for severe OA. Topical Apercreme or Icy Hot recommended. Stretching will be beneficial. Encouraged patient to reconsider referral back to Ortho.

## 2015-07-27 NOTE — Assessment & Plan Note (Signed)
Keep follow-up with Urology. Urine dip unremarkable. Will add-on Myrbetriq to improve symptoms.

## 2015-07-28 ENCOUNTER — Telehealth: Payer: Self-pay | Admitting: Pulmonary Disease

## 2015-07-28 DIAGNOSIS — R351 Nocturia: Secondary | ICD-10-CM | POA: Diagnosis not present

## 2015-07-28 DIAGNOSIS — G4733 Obstructive sleep apnea (adult) (pediatric): Secondary | ICD-10-CM

## 2015-07-28 DIAGNOSIS — C61 Malignant neoplasm of prostate: Secondary | ICD-10-CM | POA: Diagnosis not present

## 2015-07-28 NOTE — Telephone Encounter (Signed)
Spoke to the patient informed of PCP instruction.  He stated he saw Dr. Jeffie Pollock yesterday 07/27/15.  He stated he thought Dr. Jeffie Pollock prescribed flomax and has not filled yet still trying to decide what to do.  Will forward response to [employer] PA-C.

## 2015-07-28 NOTE — Telephone Encounter (Signed)
Will defer to Urology

## 2015-07-28 NOTE — Telephone Encounter (Signed)
lmomtcb x1 

## 2015-07-29 NOTE — Telephone Encounter (Signed)
Low oxygen does not cause urinary retention His medications Spiriva certainly can- he can trial off this medication for 2 weeks and see if improvement. For sleep apnea, he will need to perform sleep study

## 2015-07-29 NOTE — Telephone Encounter (Signed)
Closed in error ------------------------------- Pt notified. Pt ok with having sleep study  RA - do you want HST?

## 2015-07-29 NOTE — Telephone Encounter (Signed)
Called and spoke to pt. Pt stated he was informed by his urologist that his nocturnal O2 is causing urinary retention. Pt questioning if he still needs nocturnal O2 and if he has sleep apnea. Pt last seen 9.29.2016 by TP.   Dr. Elsworth Soho please advise. Thanks.

## 2015-07-30 NOTE — Telephone Encounter (Signed)
[place] for home study

## 2015-08-01 NOTE — Telephone Encounter (Signed)
lmtcb for pt.  

## 2015-08-02 ENCOUNTER — Telehealth: Payer: Self-pay | Admitting: Physician Assistant

## 2015-08-02 MED ORDER — TAMSULOSIN HCL 0.4 MG PO CAPS: 0.4000 mg | ORAL_CAPSULE | Freq: Every day | ORAL | Status: DC

## 2015-08-02 NOTE — Telephone Encounter (Signed)
Ok to refill medications. Unfortunately I have no control over what Pulmonary is doing for him but I am sure they have their reasons for stopping the oxygen. I will try to review their notes.

## 2015-08-02 NOTE — Telephone Encounter (Signed)
Caller name: Grey Rakestraw   Relationship to patient: Self   Can be reached: [phone]   Pharmacy: [id number] - [place], [address]  Reason for call: Pt says that Pulmonary is taking way his oxygen that he is very unhappy about. Pt is also requesting a medication refill on a few medications.  Flomax, Ipratropium to go into his nebulizer.

## 2015-08-02 NOTE — Telephone Encounter (Signed)
LMOM with contact name and number for return call RE: clarification on Nebulizer medication/SLS

## 2015-08-02 NOTE — Telephone Encounter (Signed)
Order entered for HST lmtcb.

## 2015-08-02 NOTE — Addendum Note (Signed)
Addended by: Mathis Dad on: 08/02/2015 05:27 PM   Modules accepted: Orders

## 2015-08-03 MED ORDER — CETIRIZINE HCL 10 MG PO TABS: 10.0000 mg | ORAL_TABLET | Freq: Every day | ORAL | Status: DC

## 2015-08-03 MED ORDER — IPRATROPIUM-ALBUTEROL 0.5-2.5 (3) MG/3ML IN SOLN: 3.0000 mL | Freq: Four times a day (QID) | RESPIRATORY_TRACT | Status: DC | PRN

## 2015-08-03 MED ORDER — FLUTICASONE PROPIONATE 50 MCG/ACT NA SUSP: 2.0000 | Freq: Every day | NASAL | Status: DC

## 2015-08-03 NOTE — Telephone Encounter (Signed)
lmtcb for pt.  

## 2015-08-03 NOTE — Telephone Encounter (Signed)
FYI: Spoke with patient for clarification, states it is the DuoNeb solution that he uses in his Nebulizer, also that he had spoken with Adyen Bifulco RE: Allergies and suggestion was made for Flonase & Zyrtec; sent to patient's pharmacy as Rx, so that he can price match it to the OTC price and his Insurance coverage. Patien talso states that he was informed by Pulmonology that his night time Oxygen use is a major factor in patient's nocturia and d/t his OSA, he has a Sleep Study scheduled/SLS

## 2015-08-04 NOTE — Telephone Encounter (Signed)
Pt notified. Nothing further needed

## 2015-08-07 ENCOUNTER — Other Ambulatory Visit: Payer: Self-pay | Admitting: Physician Assistant

## 2015-08-10 ENCOUNTER — Telehealth: Payer: Self-pay | Admitting: *Deleted
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
## 2015-08-12 ENCOUNTER — Encounter: Payer: Self-pay | Admitting: Physician Assistant

## 2015-08-12 ENCOUNTER — Ambulatory Visit (INDEPENDENT_AMBULATORY_CARE_PROVIDER_SITE_OTHER): Payer: Medicare Other | Admitting: Physician Assistant

## 2015-08-12 VITALS — BP 122/79 | HR 60 | Temp 98.2°F | Resp 16 | Ht 72.0 in | Wt 211.2 lb

## 2015-08-12 DIAGNOSIS — Z23 Encounter for immunization: Secondary | ICD-10-CM

## 2015-08-12 DIAGNOSIS — Z1159 Encounter for screening for other viral diseases: Secondary | ICD-10-CM

## 2015-08-12 DIAGNOSIS — R040 Epistaxis: Secondary | ICD-10-CM | POA: Diagnosis not present

## 2015-08-12 MED ORDER — ZOSTER VACCINE LIVE 19400 UNT/0.65ML ~~LOC~~ SOLR: 0.6500 mL | Freq: Once | SUBCUTANEOUS | Status: DC

## 2015-08-12 NOTE — Patient Instructions (Signed)
Please go to the lab for Hep C testing. You are up-to-date on your flu shot.  Call your insurance representative to see where they will pay the most for your shingles vaccine. Take the prescription given to that pharmacy to have injection.  Start a daily Claritin. Place a humidifier in the bedroom. Apply some topical Vaseline just inside the nasal passages to lubricate the area. Use Flonase every other day to prevent drying out of the nose. Follow-up if symptoms are not resolving.

## 2015-08-12 NOTE — Progress Notes (Signed)
Pre visit review using our clinic review tool, if applicable. No additional management support is needed unless otherwise documented below in the visit note/SLS  

## 2015-08-13 LAB — HEPATITIS C ANTIBODY: HCV AB: NEGATIVE

## 2015-08-15 DIAGNOSIS — R040 Epistaxis: Secondary | ICD-10-CM | POA: Insufficient documentation

## 2015-08-15 NOTE — Progress Notes (Signed)
Patient presents to clinic today to discuss multiple issues.  Patient experiencing bilateral ear pressure with nasal congestion and mild nose bleeds over the past week. Also endorses some hearing loss after loud noise exposure that has since resolved. Denies cough, chest congestion, fever, chills or malaise. Denies tinnitus.  Patient states he received note from insurance regarding screening for Hep C giving date of birth. Patient has never had checked and would like this completed at today's visit.  Past Medical History  Diagnosis Date  . Hypertension   . Prostate cancer ([employer])   . GERD (gastroesophageal reflux disease)   . History of TB (tuberculosis)     1984--  hospitalized for 4 month treatment  . Post-polio syndrome     polio at age 89--PT WAS IN IRON LUNG; PT WAS IN W/C UNTIL AGE 66; STILL HAS WEAKNESS RIGHT SIDE  . History of chronic bronchitis   . History of rheumatic fever   . Bladder outlet obstruction   . Anxiety disorder   . History of urinary retention   . Nocturnal oxygen desaturation     USES O2 NIGHTLY  . Prostate cancer ([place])   . Complication of anesthesia     DIFFICULT WAKING   . Diabetes mellitus without complication ([place])     BODERLINE - DIET CONTROL  . Anxiety   . Shortness of breath dyspnea     RIGHT HEMIDIAPHRAGM ELEVATION - POST POLIO SYNDROME  . Dysrhythmia     PVC'S  . Arthritis     BILATERAL SHOULDERS, ELBOWS AND HANDS AND LEFT HIP AND KNEES--HX CORTISONE SHOTS IN SHOULDERS, ELBOWS, HIP AND KNEES  . History of oxygen administration     oxygen use 2 l/m nasally at bedtime and exertional occasions  . COPD, frequent exacerbations ([place])     pulmologist-  dr Joya Gaskins--  Girtha Rm Stage C.04-25-15 recent COPD exacerbation-much improved now, after tx. in ER [facility].  Marland Kitchen PONV (postoperative nausea and vomiting)     Current Outpatient Prescriptions on File Prior to Visit  Medication Sig Dispense Refill  . albuterol (PROVENTIL HFA;VENTOLIN HFA) 108 (90  BASE) MCG/ACT inhaler Inhale 2 puffs into the lungs every 6 (six) hours as needed for wheezing or shortness of breath. 3 Inhaler 3  . ALPRAZolam (XANAX) 1 MG tablet Take 0.5 tablets (0.5 mg total) by mouth 3 (three) times daily as needed for anxiety. 90 tablet 0  . amLODipine (NORVASC) 10 MG tablet TAKE 1 TABLET BY MOUTH ONCE DAILY 90 tablet 1  . BREO ELLIPTA 100-25 MCG/INH AEPB INHALE 1 PUFF INTO THE LUNGS EVERY MORNING. DAILY 60 each 5  . calcium carbonate (TUMS - DOSED IN MG ELEMENTAL CALCIUM) 500 MG chewable tablet Chew 1 tablet by mouth 3 (three) times daily as needed for indigestion or heartburn.    . cetirizine (ZYRTEC) 10 MG tablet Take 1 tablet (10 mg total) by mouth daily. 30 tablet 6  . citalopram (CELEXA) 10 MG tablet Take 1 tablet (10 mg total) by mouth daily. 30 tablet 1  . colchicine 0.6 MG tablet Take 2 tablets by mouth.  Take another tablet in 1 hour.  Can repeat in 3 days. 6 tablet 3  . famotidine (PEPCID) 20 MG tablet Take 20 mg by mouth 2 (two) times daily.    . fluticasone (FLONASE) 50 MCG/ACT nasal spray Place 2 sprays into both nostrils daily. 16 g 6  . ipratropium-albuterol (DUONEB) 0.5-2.5 (3) MG/3ML SOLN Take 3 mLs by nebulization every 6 (six) hours as  needed. 360 mL 6  . loratadine (CLARITIN) 10 MG tablet TAKE 1 TABLET BY MOUTH EVERY DAY 30 tablet 5  . metoprolol succinate (TOPROL-XL) 25 MG 24 hr tablet TAKE 1 TABLET BY MOUTH ONCE DAILY 30 tablet 3  . mirabegron ER (MYRBETRIQ) 25 MG TB24 tablet Take 1 tablet (25 mg total) by mouth daily. 30 tablet 1  . montelukast (SINGULAIR) 10 MG tablet TAKE 1 TABLET (10 MG TOTAL) BY MOUTH AT BEDTIME. 30 tablet 2  . nitroGLYCERIN (NITROSTAT) 0.4 MG SL tablet Place 1 tablet (0.4 mg total) under the tongue every 5 (five) minutes as needed for chest pain. 30 tablet 3  . ondansetron (ZOFRAN ODT) 8 MG disintegrating tablet 8mg  ODT q4 hours prn nausea 6 tablet 0  . oxyCODONE (ROXICODONE) 15 MG immediate release tablet Take 1 tablet (15 mg  total) by mouth every 8 (eight) hours as needed for pain. 90 tablet 0  . OXYGEN Inhale 2 L into the lungs at bedtime.    Marland Kitchen oxymetazoline (AFRIN) 0.05 % nasal spray Place 1-2 sprays into both nostrils daily as needed for congestion.    . predniSONE (DELTASONE) 20 MG tablet Take 20 mg by mouth daily as needed (tightness in chest).     . promethazine (PHENERGAN) 12.5 MG tablet Take 1 tablet (12.5 mg total) by mouth every 8 (eight) hours as needed for nausea or vomiting. 30 tablet 0  . tamsulosin (FLOMAX) 0.4 MG CAPS capsule Take 1 capsule (0.4 mg total) by mouth daily. 30 capsule 3  . tiotropium (SPIRIVA) 18 MCG inhalation capsule Place 1 capsule (18 mcg total) into inhaler and inhale daily. IN AM 90 capsule 1   No current facility-administered medications on file prior to visit.    Allergies  Allergen Reactions  . Ivp Dye [Iodinated Diagnostic Agents] Anaphylaxis  . Levaquin [Levofloxacin In D5w] Shortness Of Breath    In addition: sweating, chest pain, and diarrhea.   . Penicillins Anaphylaxis    Heart stops  . Aspirin Other (See Comments)    Reaction unknown  . Morphine And Related Nausea And Vomiting    Can take with zofran   . Nsaids Other (See Comments)    Difficulty breathing    Family History  Problem Relation Age of Onset  . Alzheimer's disease Father 4    Deceased  . Stomach cancer Father   . Heart attack Father   . Heart disease Father   . Skin cancer Mother     Facial-Living  . Alcohol abuse Sister     x2  . Mental illness Sister     x2  . Diabetes Maternal Aunt     x2  . Thyroid disease Maternal Aunt     x4  . Diabetes Maternal Uncle   . Tuberculosis Paternal Grandfather   . Tuberculosis Paternal Grandmother   . Alzheimer's disease Paternal Aunt   . Alzheimer's disease Paternal Uncle   . Colon cancer Neg Hx   . Colon polyps Neg Hx   . Crohn's disease Neg Hx   . Ulcerative colitis Neg Hx     Social History   Social History  . Marital Status: Married      Spouse Name: N/A  . Number of Children: N/A  . Years of Education: N/A   Occupational History  . Retired    Social History Main Topics  . Smoking status: Former Smoker -- 74 years    Quit date: 08/25/2013  . Smokeless tobacco: Never Used  . Alcohol  Use: 7.2 oz/week    12 Cans of beer per week     Comment: average 3 beers per day  . Drug Use: No  . Sexual Activity: Not Asked   Other Topics Concern  . None   Social History Narrative    Review of Systems - See HPI.  All other ROS are negative.  BP 122/79 mmHg  Pulse 60  Temp(Src) 98.2 F (36.8 C) (Oral)  Resp 16  Ht 6' (1.829 m)  Wt 211 lb 4 oz (95.822 kg)  BMI 28.64 kg/m2  SpO2 98%  Physical Exam  Constitutional: He is oriented to person, place, and time and well-developed, well-nourished, and in no distress.  HENT:  Head: Normocephalic and atraumatic.  Right Ear: Hearing, tympanic membrane, external ear and ear canal normal.  Left Ear: Hearing, tympanic membrane, external ear and ear canal normal.  Nose: Rhinorrhea present. Epistaxis is observed. Right sinus exhibits no maxillary sinus tenderness and no frontal sinus tenderness. Left sinus exhibits no maxillary sinus tenderness and no frontal sinus tenderness.  Mouth/Throat: Uvula is midline, oropharynx is clear and moist and mucous membranes are normal.  Eyes: Conjunctivae are normal.  Cardiovascular: Normal rate, regular rhythm, normal heart sounds and intact distal pulses.   Pulmonary/Chest: Effort normal and breath sounds normal. No respiratory distress. He has no wheezes. He has no rales. He exhibits no tenderness.  Neurological: He is alert and oriented to person, place, and time.  Skin: Skin is warm and dry. No rash noted.  Psychiatric: Affect normal.  Vitals reviewed.   Recent Results (from the past 2160 hour(s))  Hepatitis C Antibody     Status: None   Collection Time: 08/12/15 11:34 AM  Result Value Ref Range   HCV Ab NEGATIVE NEGATIVE     Assessment/Plan: Need for zoster vaccination Discussed Medicare coverage for vaccination. Rx for vaccine printed and given to patient. He is to call his insurance rep to see where vaccine will be the most covered.  Need for hepatitis C screening test Routine. Will check HepC Ab today.  Bleeding nose Anterior vessels. Mild. Discussed decreasing Flonase use to 1 squirt in each nostril per day. Humidifier in bedroom. Vaseline to coat nasal lining as directed. Start daily claritin to help with allergy symptoms. Follow-up if not resolving.

## 2015-08-15 NOTE — Assessment & Plan Note (Signed)
Discussed Medicare coverage for vaccination. Rx for vaccine printed and given to patient. He is to call his insurance rep to see where vaccine will be the most covered.

## 2015-08-15 NOTE — Assessment & Plan Note (Signed)
Anterior vessels. Mild. Discussed decreasing Flonase use to 1 squirt in each nostril per day. Humidifier in bedroom. Vaseline to coat nasal lining as directed. Start daily claritin to help with allergy symptoms. Follow-up if not resolving.

## 2015-08-15 NOTE — Assessment & Plan Note (Signed)
Routine. Will check HepC Ab today.

## 2015-08-23 ENCOUNTER — Telehealth: Payer: Self-pay | Admitting: Pulmonary Disease

## 2015-08-23 NOTE — Telephone Encounter (Signed)
I called the patient on three separate dates (08/04/15, 08/15/15 & 08/19/15) and LMTCB.  Dr. Elsworth Soho ordered a HST for this patient on 08/02/15.  I was calling to get patient scheduled to pick up one of our machines.  I have not received any response to the messages I left the patient.  The patient's Order has been placed in 53 office in the folder that is marked for patients we are unable to reach.

## 2015-08-31 ENCOUNTER — Telehealth: Payer: Self-pay | Admitting: Physician Assistant

## 2015-08-31 DIAGNOSIS — Z23 Encounter for immunization: Secondary | ICD-10-CM

## 2015-08-31 MED ORDER — ZOSTER VACCINE LIVE 19400 UNT/0.65ML ~~LOC~~ SOLR: 0.6500 mL | Freq: Once | SUBCUTANEOUS | Status: DC

## 2015-08-31 NOTE — Telephone Encounter (Signed)
Reprinted. Please call patient to let him know he can pick up the script.

## 2015-08-31 NOTE — Telephone Encounter (Signed)
Ok to re print 

## 2015-08-31 NOTE — Telephone Encounter (Signed)
Spoke with pt's wife, informed her rx is ready for pt to pick up

## 2015-08-31 NOTE — Telephone Encounter (Signed)
Caller name: Self   Can be reached: [phone] or [phone]   Reason for call: Patient was given prescription for Shingles Vac last week but has lost it and need another one. Wants to pick it up tomorrow.

## 2015-09-02 ENCOUNTER — Emergency Department (HOSPITAL_BASED_OUTPATIENT_CLINIC_OR_DEPARTMENT_OTHER)
Admission: EM | Admit: 2015-09-02 | Discharge: 2015-09-02 | Disposition: A | Discharge status: Home / Self Care | Payer: Medicare Other | Attending: Emergency Medicine | Admitting: Emergency Medicine

## 2015-09-02 ENCOUNTER — Telehealth: Payer: Self-pay | Admitting: Physician Assistant

## 2015-09-02 ENCOUNTER — Emergency Department (HOSPITAL_BASED_OUTPATIENT_CLINIC_OR_DEPARTMENT_OTHER): Payer: Medicare Other

## 2015-09-02 ENCOUNTER — Encounter (HOSPITAL_BASED_OUTPATIENT_CLINIC_OR_DEPARTMENT_OTHER): Payer: Self-pay | Admitting: *Deleted

## 2015-09-02 DIAGNOSIS — Z87891 Personal history of nicotine dependence: Secondary | ICD-10-CM | POA: Insufficient documentation

## 2015-09-02 DIAGNOSIS — Z8619 Personal history of other infectious and parasitic diseases: Secondary | ICD-10-CM | POA: Insufficient documentation

## 2015-09-02 DIAGNOSIS — Z7951 Long term (current) use of inhaled steroids: Secondary | ICD-10-CM | POA: Insufficient documentation

## 2015-09-02 DIAGNOSIS — M1990 Unspecified osteoarthritis, unspecified site: Secondary | ICD-10-CM | POA: Diagnosis not present

## 2015-09-02 DIAGNOSIS — Z8611 Personal history of tuberculosis: Secondary | ICD-10-CM | POA: Insufficient documentation

## 2015-09-02 DIAGNOSIS — E119 Type 2 diabetes mellitus without complications: Secondary | ICD-10-CM | POA: Diagnosis not present

## 2015-09-02 DIAGNOSIS — Z8546 Personal history of malignant neoplasm of prostate: Secondary | ICD-10-CM | POA: Diagnosis not present

## 2015-09-02 DIAGNOSIS — Z79899 Other long term (current) drug therapy: Secondary | ICD-10-CM | POA: Insufficient documentation

## 2015-09-02 DIAGNOSIS — I10 Essential (primary) hypertension: Secondary | ICD-10-CM | POA: Diagnosis not present

## 2015-09-02 DIAGNOSIS — Z8669 Personal history of other diseases of the nervous system and sense organs: Secondary | ICD-10-CM | POA: Insufficient documentation

## 2015-09-02 DIAGNOSIS — Z880 Allergy status to penicillin: Secondary | ICD-10-CM | POA: Diagnosis not present

## 2015-09-02 DIAGNOSIS — Z87448 Personal history of other diseases of urinary system: Secondary | ICD-10-CM | POA: Insufficient documentation

## 2015-09-02 DIAGNOSIS — R079 Chest pain, unspecified: Secondary | ICD-10-CM | POA: Insufficient documentation

## 2015-09-02 DIAGNOSIS — D225 Melanocytic nevi of trunk: Secondary | ICD-10-CM | POA: Diagnosis not present

## 2015-09-02 DIAGNOSIS — F419 Anxiety disorder, unspecified: Secondary | ICD-10-CM | POA: Diagnosis not present

## 2015-09-02 DIAGNOSIS — L821 Other seborrheic keratosis: Secondary | ICD-10-CM | POA: Diagnosis not present

## 2015-09-02 DIAGNOSIS — C4441 Basal cell carcinoma of skin of scalp and neck: Secondary | ICD-10-CM | POA: Diagnosis not present

## 2015-09-02 DIAGNOSIS — K219 Gastro-esophageal reflux disease without esophagitis: Secondary | ICD-10-CM | POA: Diagnosis not present

## 2015-09-02 DIAGNOSIS — Z85820 Personal history of malignant melanoma of skin: Secondary | ICD-10-CM | POA: Diagnosis not present

## 2015-09-02 DIAGNOSIS — J449 Chronic obstructive pulmonary disease, unspecified: Secondary | ICD-10-CM | POA: Insufficient documentation

## 2015-09-02 HISTORY — DX: Malignant melanoma of skin, unspecified: C43.9

## 2015-09-02 LAB — CBC
HCT: 45.1 % (ref 39.0–52.0)
Hemoglobin: 15.4 g/dL (ref 13.0–17.0)
MCH: 32.1 pg (ref 26.0–34.0)
MCHC: 34.1 g/dL (ref 30.0–36.0)
MCV: 94 fL (ref 78.0–100.0)
Platelets: 259 10*3/uL (ref 150–400)
RBC: 4.8 MIL/uL (ref 4.22–5.81)
RDW: 12.8 % (ref 11.5–15.5)
WBC: 6.2 10*3/uL (ref 4.0–10.5)

## 2015-09-02 LAB — BASIC METABOLIC PANEL
ANION GAP: 9 (ref 5–15)
BUN: 19 mg/dL (ref 6–20)
CALCIUM: 9.7 mg/dL (ref 8.9–10.3)
CHLORIDE: 102 mmol/L (ref 101–111)
CO2: 24 mmol/L (ref 22–32)
CREATININE: 1.22 mg/dL (ref 0.61–1.24)
GFR calc non Af Amer: >60 mL/min (ref >60)
Glucose, Bld: 92 mg/dL (ref 65–99)
Potassium: 3.9 mmol/L (ref 3.5–5.1)
SODIUM: 135 mmol/L (ref 135–145)

## 2015-09-02 LAB — TROPONIN I

## 2015-09-02 NOTE — Telephone Encounter (Signed)
Called to see if patient has gone to ED. No answer.

## 2015-09-02 NOTE — ED Notes (Signed)
MD at bedside. 

## 2015-09-02 NOTE — ED Notes (Signed)
Pt reports intermittent chest pain x 2 weeks- states he gets light headed and sweaty with the pain- took NTG SL yesterday which relieved his pain- states I think this my COPD

## 2015-09-02 NOTE — Telephone Encounter (Signed)
[facility] Day - Client TELEPHONE ADVICE RECORD [facility] Patient Name: [name] DOB: Sep 17, 1951 Initial Comment Caller states he has been having fever and headaches off and on for the last two weeks Nurse Assessment Nurse: Martyn Ehrich, RN, Felicia Date/Time (Eastern Time): 09/02/2015 3:03:51 PM Confirm and document reason for call. If symptomatic, describe symptoms. ---PT has been having fever one day and then it goes away for a day then the next day he will have one and intermittent HA's for 2 weeks. No thermometer. When he feels he has a fever he get's heart spasms. Dx in the past with heart rhythm each time. He is on BP medication. The directions say take BP medication once a day - he is taking the BP medication earlier daily bc fever gives him heart palpitations and heart pressure or pain or two in chest. He had this this am. Now the symptoms hit him a 11 am. sweating and chest got sweaty and it went away. Has the patient traveled out of the country within the last 30 days? ---NoDoes the patient have any new or worsening symptoms? ---Yes Will a triage be completed? ---Yes Related visit to physician within the last 2 weeks? ---No Does the PT have any chronic conditions? (i.e. diabetes, asthma, etc.) ---Yes List chronic conditions. ---heart rhythm issue, HTN, copd, Cancer - prostate and he does not know if it spread - PSA 1 month ago was high and was told to be checked soon. Last year they shaved prostate but did not remove it. Is this a behavioral health call? ---No Guidelines Guideline Title Affirmed Question Affirmed Notes Chest Pain Difficult to awaken or acting confused (e.g., disoriented, slurred speech) Final Disposition User Call EMS 911 Now [place], RN, [place] Ice Comments told pt anytime he has chest pressure with perspiration call us immediately. Also told him if he ever wants to take his medicationon different schedule than  prescribed call us instead of doing so referenced nurses own knowledge Disagree/Comply: Comply  Call Id: [id number]

## 2015-09-02 NOTE — Discharge Instructions (Signed)
Return to the ED with any concerns including difficulty breathing, fainting, worsening chest pain, leg swelling, decreased level of alertness/lethargy, or any other alarming symptoms

## 2015-09-02 NOTE — ED Provider Notes (Signed)
CSN: [id number]     Arrival date & time 09/02/15  1609 History   First MD Initiated Contact with Patient 09/02/15 1715     Chief Complaint  Patient presents with  . Chest Pain     (Consider location/radiation/quality/duration/timing/severity/associated sxs/prior Treatment) HPI  Pt presenting with c/o intermittent chest pain.  He describes the pain as a sharp stabbing fleeting pain that feels like a spasm and then completely resolves on its own.  He has some baseline cough and shortness of breath which he attributes to his copd.  He states he has been using his inhalers and medications as directed.  No leg swelling.  No nausea or radiation of pain.  States he does not have pain at this time but before I entered the room he had a sharp spasm of pain.  No fever/chills.  No changes in his cough or sputum production.  There are no other associated systemic symptoms, there are no other alleviating or modifying factors.   Past Medical History  Diagnosis Date  . Hypertension   . Prostate cancer ([place])   . GERD (gastroesophageal reflux disease)   . History of TB (tuberculosis)     1984--  hospitalized for 4 month treatment  . Post-polio syndrome     polio at age 71--PT WAS IN IRON LUNG; PT WAS IN W/C UNTIL AGE 17; STILL HAS WEAKNESS RIGHT SIDE  . History of chronic bronchitis   . History of rheumatic fever   . Bladder outlet obstruction   . Anxiety disorder   . History of urinary retention   . Nocturnal oxygen desaturation     USES O2 NIGHTLY  . Prostate cancer ([place])   . Complication of anesthesia     DIFFICULT WAKING   . Diabetes mellitus without complication ([place])     BODERLINE - DIET CONTROL  . Anxiety   . Shortness of breath dyspnea     RIGHT HEMIDIAPHRAGM ELEVATION - POST POLIO SYNDROME  . Dysrhythmia     PVC'S  . Arthritis     BILATERAL SHOULDERS, ELBOWS AND HANDS AND LEFT HIP AND KNEES--HX CORTISONE SHOTS IN SHOULDERS, ELBOWS, HIP AND KNEES  . History of oxygen administration      oxygen use 2 l/m nasally at bedtime and exertional occasions  . COPD, frequent exacerbations ([place])     pulmologist-  dr Joya Gaskins--  [place] Rm Stage C.04-25-15 recent COPD exacerbation-much improved now, after tx. in ER [facility].  Marland Kitchen PONV (postoperative nausea and vomiting)   . Melanoma [facility])    Past Surgical History  Procedure Laterality Date  . Other surgical history       Muscle & bone Graft/Polio  . Cystoscopy w/ cystogram/  transrectal ultrasound prostate bx  03-22-2009  . Cardiovascular stress test  06-08-2014  dr Mare Ferrari    normal lexiscan study/  no ischemia/  not gated due to PAC's  . Ureterosopy stone extraction  2000  . Nasal septum surgery  2000  . Shoulder arthroscopy with open rotator cuff repair Bilateral 2013  &  1999    removal spurs and bursectomy  . Laparoscopic cholecystectomy  2013  . Prostate biopsy N/A 09/28/2014    Procedure: PROSTATE ULTRASOUND/BIOPSY;  Surgeon: [name], MD;  Location: [facility]S;  Service: Urology;  Laterality: N/A;  . Transurethral resection of prostate N/A 09/28/2014    Procedure: TRANSURETHRAL RESECTION OF THE PROSTATE (TURP);  Surgeon: [name], MD;  Location: [facility];  Service: Urology;  Laterality: N/A;  .  Esophagogastroduodenoscopy N/A 03/22/2015    Procedure: ESOPHAGOGASTRODUODENOSCOPY (EGD) with dilation;  Surgeon: [name], MD;  Location: [facility];  Service: Endoscopy;  Laterality: N/A;  . [place] dilation N/A 03/22/2015    Procedure: SAVORY DILATION;  Surgeon: [name], MD;  Location: [facility];  Service: Endoscopy;  Laterality: N/A;  . Colonoscopy N/A 05/03/2015    Procedure: COLONOSCOPY;  Surgeon: [name], MD;  Location: [facility];  Service: Endoscopy;  Laterality: N/A;   Family History  Problem Relation Age of Onset  . Alzheimer's disease Father 4    Deceased  . Stomach cancer Father   . Heart attack Father   . Heart disease Father   . Skin cancer Mother     Facial-Living  . Alcohol abuse Sister      x2  . Mental illness Sister     x2  . Diabetes Maternal Aunt     x2  . Thyroid disease Maternal Aunt     x4  . Diabetes Maternal Uncle   . Tuberculosis Paternal Grandfather   . Tuberculosis Paternal Grandmother   . Alzheimer's disease Paternal Aunt   . Alzheimer's disease Paternal Uncle   . Colon cancer Neg Hx   . Colon polyps Neg Hx   . Crohn's disease Neg Hx   . Ulcerative colitis Neg Hx    Social History  Substance Use Topics  . Smoking status: Former Smoker -- 25 years    Quit date: 08/25/2013  . Smokeless tobacco: Never Used  . Alcohol Use: 7.2 oz/week    12 Cans of beer per week     Comment: average 3 beers per day    Review of Systems  ROS reviewed and all otherwise negative except for mentioned in HPI    Allergies  Ivp dye; Levaquin; Penicillins; Aspirin; Morphine and related; and Nsaids  Home Medications   Prior to Admission medications   Medication Sig Start Date End Date Taking? Authorizing Provider  albuterol (PROVENTIL HFA;VENTOLIN HFA) 108 (90 BASE) MCG/ACT inhaler Inhale 2 puffs into the lungs every 6 (six) hours as needed for wheezing or shortness of breath. 05/05/15  Yes Brunetta Jeans, PA-C  ALPRAZolam [name]) 1 MG tablet Take 0.5 tablets (0.5 mg total) by mouth 3 (three) times daily as needed for anxiety. 01/11/15  Yes Brunetta Jeans, PA-C  amLODipine (NORVASC) 10 MG tablet TAKE 1 TABLET BY MOUTH ONCE DAILY 03/01/15  Yes Brunetta Jeans, PA-C  BREO ELLIPTA 100-25 MCG/INH AEPB INHALE 1 PUFF INTO THE LUNGS EVERY MORNING. DAILY 08/08/15  Yes Brunetta Jeans, PA-C  cetirizine (ZYRTEC) 10 MG tablet Take 1 tablet (10 mg total) by mouth daily. 08/03/15  Yes Brunetta Jeans, PA-C  colchicine 0.6 MG tablet Take 2 tablets by mouth.  Take another tablet in 1 hour.  Can repeat in 3 days. 01/18/15  Yes Brunetta Jeans, PA-C  famotidine (PEPCID) 20 MG tablet Take 20 mg by mouth 2 (two) times daily.   Yes Historical Provider, MD  fluticasone (FLONASE) 50 MCG/ACT  nasal spray Place 2 sprays into both nostrils daily. 08/03/15  Yes Brunetta Jeans, PA-C  ipratropium-albuterol (DUONEB) 0.5-2.5 (3) MG/3ML SOLN Take 3 mLs by nebulization every 6 (six) hours as needed. 08/03/15  Yes Brunetta Jeans, PA-C  loratadine (CLARITIN) 10 MG tablet TAKE 1 TABLET BY MOUTH EVERY DAY 05/14/15  Yes Brunetta Jeans, PA-C  metoprolol succinate (TOPROL-XL) 25 MG 24 hr tablet TAKE 1 TABLET BY MOUTH ONCE DAILY  01/26/15  Yes Brunetta Jeans, PA-C  montelukast (SINGULAIR) 10 MG tablet TAKE 1 TABLET (10 MG TOTAL) BY MOUTH AT BEDTIME. 05/23/15  Yes Elsie Stain, MD  nitroGLYCERIN (NITROSTAT) 0.4 MG SL tablet Place 1 tablet (0.4 mg total) under the tongue every 5 (five) minutes as needed for chest pain. 06/21/15  Yes Brunetta Jeans, PA-C  ondansetron (ZOFRAN ODT) 8 MG disintegrating tablet 8mg  ODT q4 hours prn nausea 06/21/15  Yes Brunetta Jeans, PA-C  oxyCODONE (ROXICODONE) 15 MG immediate release tablet Take 1 tablet (15 mg total) by mouth every 8 (eight) hours as needed for pain. 07/26/15  Yes Brunetta Jeans, PA-C  OXYGEN Inhale 2 L into the lungs at bedtime.   Yes Historical Provider, MD  predniSONE (DELTASONE) 20 MG tablet Take 20 mg by mouth daily as needed (tightness in chest).    Yes Historical Provider, MD  promethazine (PHENERGAN) 12.5 MG tablet Take 1 tablet (12.5 mg total) by mouth every 8 (eight) hours as needed for nausea or vomiting. 07/13/14  Yes Brunetta Jeans, PA-C  tiotropium (SPIRIVA) 18 MCG inhalation capsule Place 1 capsule (18 mcg total) into inhaler and inhale daily. IN AM 05/05/15  Yes Brunetta Jeans, PA-C  calcium carbonate (TUMS - DOSED IN MG ELEMENTAL CALCIUM) 500 MG chewable tablet Chew 1 tablet by mouth 3 (three) times daily as needed for indigestion or heartburn.    Historical Provider, MD  citalopram (CELEXA) 10 MG tablet Take 1 tablet (10 mg total) by mouth daily. 06/21/15   Brunetta Jeans, PA-C  mirabegron ER (MYRBETRIQ) 25 MG TB24 tablet Take 1  tablet (25 mg total) by mouth daily. 07/26/15   Brunetta Jeans, PA-C  oxymetazoline (AFRIN) 0.05 % nasal spray Place 1-2 sprays into both nostrils daily as needed for congestion.    Historical Provider, MD  tamsulosin (FLOMAX) 0.4 MG CAPS capsule Take 1 capsule (0.4 mg total) by mouth daily. 08/02/15   Brunetta Jeans, PA-C  zoster vaccine live, PF, (ZOSTAVAX) 09811 UNT/0.65ML injection Inject 19,400 Units into the skin once. 08/31/15   Brunetta Jeans, PA-C   BP 130/79 mmHg  Pulse 79  Temp(Src) 98.6 F (37 C) (Oral)  Resp 18  Ht 6\' 1"  (1.854 m)  Wt 218 lb (98.884 kg)  BMI 28.77 kg/m2  SpO2 93%  Vitals reviewed Physical Exam  Physical Examination: General appearance - alert, well appearing, and in no distress Mental status - alert, oriented to person, place, and time Eyes - no conjunctival injection, no scleral icterus Mouth - mucous membranes moist, pharynx normal without lesions Chest - clear to auscultation, no wheezes, rales or rhonchi, symmetric air entry, normal respiratory effort Heart - normal rate, regular rhythm, normal S1, S2, no murmurs, rubs, clicks or gallops Abdomen - soft, nontender, nondistended, no masses or organomegaly Neurological - alert, oriented, normal speech Extremities - peripheral pulses normal, no pedal edema, no clubbing or cyanosis Skin - normal coloration and turgor, no rashes  ED Course  Procedures (including critical care time) Labs Review Labs Reviewed  CBC  BASIC METABOLIC PANEL  TROPONIN I    Imaging Review Dg Chest 2 View  09/02/2015  CLINICAL DATA:  Pt reports intermittent chest pain x 2 weeks- states he gets light headed and sweaty with the pain, former smoker EXAM: CHEST - 2 VIEW COMPARISON:  04/25/2015 FINDINGS: Coarse interstitial markings at the lung bases as before. No confluent infiltrate. Heart size normal. No pneumothorax. No effusion. Visualized skeletal structures  are unremarkable. Surgical clips right upper abdomen.  IMPRESSION: No acute cardiopulmonary disease. Electronically Signed   By: Lucrezia Europe M.D.   On: 09/02/2015 18:27   I have personally reviewed and evaluated these images and lab results as part of my medical decision-making.   EKG Interpretation   Date/Time:  Friday September 02 2015 16:21:28 EST Ventricular Rate:  82 PR Interval:  160 QRS Duration: 96 QT Interval:  372 QTC Calculation: 434 R Axis:   43 Text Interpretation:  Normal sinus rhythm Incomplete right bundle branch  block Borderline ECG No significant change since last tracing Confirmed by  [facility]  MD, Obdulio Mash [phone]) on 09/02/2015 8:15:39 PM      MDM   Final diagnoses:  Chest pain, unspecified chest pain type    Pt presenting with c/o sharp stabbing intermittent fleeting pains in chest over the past several weeks.  No change in his baseline shortness of breath and cough associated with COPD.  Doubt ACS, PE.  CXR reassuring as well as EKG and troponin.  Discharged with strict return precautions.  Pt agreeable with plan.   Alfonzo Beers, MD 09/03/15 [phone]

## 2015-09-06 ENCOUNTER — Encounter: Payer: Self-pay | Admitting: Physician Assistant

## 2015-09-06 ENCOUNTER — Ambulatory Visit (INDEPENDENT_AMBULATORY_CARE_PROVIDER_SITE_OTHER): Payer: Medicare Other | Admitting: Physician Assistant

## 2015-09-06 VITALS — BP 140/59 | HR 80 | Temp 98.0°F | Resp 16 | Ht 72.0 in | Wt 209.1 lb

## 2015-09-06 DIAGNOSIS — K219 Gastro-esophageal reflux disease without esophagitis: Secondary | ICD-10-CM | POA: Diagnosis not present

## 2015-09-06 DIAGNOSIS — Z23 Encounter for immunization: Secondary | ICD-10-CM | POA: Diagnosis not present

## 2015-09-06 DIAGNOSIS — F419 Anxiety disorder, unspecified: Secondary | ICD-10-CM | POA: Diagnosis not present

## 2015-09-06 DIAGNOSIS — R232 Flushing: Secondary | ICD-10-CM

## 2015-09-06 MED ORDER — OXYCODONE HCL 15 MG PO TABS: 15.0000 mg | ORAL_TABLET | Freq: Three times a day (TID) | ORAL | Status: DC | PRN

## 2015-09-06 MED ORDER — ALPRAZOLAM 1 MG PO TABS: 0.5000 mg | ORAL_TABLET | Freq: Three times a day (TID) | ORAL | Status: DC | PRN

## 2015-09-06 MED ORDER — CITALOPRAM HYDROBROMIDE 20 MG PO TABS: 20.0000 mg | ORAL_TABLET | Freq: Every day | ORAL | Status: DC

## 2015-09-06 MED ORDER — DEXLANSOPRAZOLE 60 MG PO CPDR: 60.0000 mg | DELAYED_RELEASE_CAPSULE | Freq: Every day | ORAL | Status: DC

## 2015-09-06 NOTE — Patient Instructions (Addendum)
Please start the new dose of Celexa. Continue Xanax and pain medications as directed.  Follow-up with Urology and Dermatology as directed.  I am referring you back to GI for assessment of your uncontrolled reflux. Start the [place], taking as directed.  Please go to the lab for blood work. I will call with results.

## 2015-09-06 NOTE — Progress Notes (Signed)
Patient presents to clinic today for ER follow-up of atypical chest pain.  Patient presented to ER on 09/02/15 c/o intermittent chest pain. Workup including EKG, CXR and troponins was performed. All negative. Patient was discharged with close follow-up with PCP, Pulmonology and GI.Marland Kitchen  Since that time patient states he has a recurring burning sensation of face and head. Denies similar sensation of chest. Does note continued acid reflux not relieved by current medication regimen. Denies epigastric pain, nausea, melena or hematochezia.  Patient recently seen by Dr. Nevada Crane with Dermatology for a concerning lesion of scalp. Endorses diagnosis of melanoma and is scheduled for Mohs procedure. Would like to discuss procedure in more detail before having done.  Past Medical History  Diagnosis Date  . Hypertension   . Prostate cancer ([place])   . GERD (gastroesophageal reflux disease)   . History of TB (tuberculosis)     1984--  hospitalized for 4 month treatment  . Post-polio syndrome     polio at age 12--PT WAS IN IRON LUNG; PT WAS IN W/C UNTIL AGE 28; STILL HAS WEAKNESS RIGHT SIDE  . History of chronic bronchitis   . History of rheumatic fever   . Bladder outlet obstruction   . Anxiety disorder   . History of urinary retention   . Nocturnal oxygen desaturation     USES O2 NIGHTLY  . Prostate cancer ([place])   . Complication of anesthesia     DIFFICULT WAKING   . Diabetes mellitus without complication ([place])     BODERLINE - DIET CONTROL  . Anxiety   . Shortness of breath dyspnea     RIGHT HEMIDIAPHRAGM ELEVATION - POST POLIO SYNDROME  . Dysrhythmia     PVC'S  . Arthritis     BILATERAL SHOULDERS, ELBOWS AND HANDS AND LEFT HIP AND KNEES--HX CORTISONE SHOTS IN SHOULDERS, ELBOWS, HIP AND KNEES  . History of oxygen administration     oxygen use 2 l/m nasally at bedtime and exertional occasions  . COPD, frequent exacerbations ([place])     pulmologist-  dr Joya Gaskins--  [place] Rm Stage C.04-25-15 recent COPD  exacerbation-much improved now, after tx. in ER [facility].  Marland Kitchen PONV (postoperative nausea and vomiting)   . Melanoma [facility])     Current Outpatient Prescriptions on File Prior to Visit  Medication Sig Dispense Refill  . albuterol (PROVENTIL HFA;VENTOLIN HFA) 108 (90 BASE) MCG/ACT inhaler Inhale 2 puffs into the lungs every 6 (six) hours as needed for wheezing or shortness of breath. 3 Inhaler 3  . amLODipine (NORVASC) 10 MG tablet TAKE 1 TABLET BY MOUTH ONCE DAILY 90 tablet 1  . BREO ELLIPTA 100-25 MCG/INH AEPB INHALE 1 PUFF INTO THE LUNGS EVERY MORNING. DAILY 60 each 5  . calcium carbonate (TUMS - DOSED IN MG ELEMENTAL CALCIUM) 500 MG chewable tablet Chew 1 tablet by mouth 3 (three) times daily as needed for indigestion or heartburn.    . cetirizine (ZYRTEC) 10 MG tablet Take 1 tablet (10 mg total) by mouth daily. 30 tablet 6  . colchicine 0.6 MG tablet Take 2 tablets by mouth.  Take another tablet in 1 hour.  Can repeat in 3 days. 6 tablet 3  . fluticasone (FLONASE) 50 MCG/ACT nasal spray Place 2 sprays into both nostrils daily. 16 g 6  . ipratropium-albuterol (DUONEB) 0.5-2.5 (3) MG/3ML SOLN Take 3 mLs by nebulization every 6 (six) hours as needed. 360 mL 6  . loratadine (CLARITIN) 10 MG tablet TAKE 1 TABLET BY MOUTH EVERY DAY 30  tablet 5  . metoprolol succinate (TOPROL-XL) 25 MG 24 hr tablet TAKE 1 TABLET BY MOUTH ONCE DAILY 30 tablet 3  . mirabegron ER (MYRBETRIQ) 25 MG TB24 tablet Take 1 tablet (25 mg total) by mouth daily. 30 tablet 1  . montelukast (SINGULAIR) 10 MG tablet TAKE 1 TABLET (10 MG TOTAL) BY MOUTH AT BEDTIME. 30 tablet 2  . nitroGLYCERIN (NITROSTAT) 0.4 MG SL tablet Place 1 tablet (0.4 mg total) under the tongue every 5 (five) minutes as needed for chest pain. 30 tablet 3  . ondansetron (ZOFRAN ODT) 8 MG disintegrating tablet 62m ODT q4 hours prn nausea 6 tablet 0  . OXYGEN Inhale 2 L into the lungs at bedtime.    .Marland Kitchenoxymetazoline (AFRIN) 0.05 % nasal spray Place 1-2 sprays  into both nostrils daily as needed for congestion.    . predniSONE (DELTASONE) 20 MG tablet Take 20 mg by mouth daily as needed (tightness in chest).     . promethazine (PHENERGAN) 12.5 MG tablet Take 1 tablet (12.5 mg total) by mouth every 8 (eight) hours as needed for nausea or vomiting. 30 tablet 0  . tamsulosin (FLOMAX) 0.4 MG CAPS capsule Take 1 capsule (0.4 mg total) by mouth daily. 30 capsule 3  . tiotropium (SPIRIVA) 18 MCG inhalation capsule Place 1 capsule (18 mcg total) into inhaler and inhale daily. IN AM 90 capsule 1  . famotidine (PEPCID) 20 MG tablet Take 20 mg by mouth 2 (two) times daily.    .Marland Kitchenzoster vaccine live, PF, (ZOSTAVAX) [id number]/0.65ML injection Inject 19,400 Units into the skin once. 1 each 0   No current facility-administered medications on file prior to visit.    Allergies  Allergen Reactions  . Ivp Dye [Iodinated Diagnostic Agents] Anaphylaxis  . Levaquin [Levofloxacin In D5w] Shortness Of Breath    In addition: sweating, chest pain, and diarrhea.   . Penicillins Anaphylaxis    Heart stops  . Aspirin Other (See Comments)    Reaction unknown  . Morphine And Related Nausea And Vomiting    Can take with zofran   . Nsaids Other (See Comments)    Difficulty breathing    Family History  Problem Relation Age of Onset  . Alzheimer's disease Father 4    Deceased  . Stomach cancer Father   . Heart attack Father   . Heart disease Father   . Skin cancer Mother     Facial-Living  . Alcohol abuse Sister     x2  . Mental illness Sister     x2  . Diabetes Maternal Aunt     x2  . Thyroid disease Maternal Aunt     x4  . Diabetes Maternal Uncle   . Tuberculosis Paternal Grandfather   . Tuberculosis Paternal Grandmother   . Alzheimer's disease Paternal Aunt   . Alzheimer's disease Paternal Uncle   . Colon cancer Neg Hx   . Colon polyps Neg Hx   . Crohn's disease Neg Hx   . Ulcerative colitis Neg Hx     Social History   Social History  . Marital  Status: Married    Spouse Name: N/A  . Number of Children: N/A  . Years of Education: N/A   Occupational History  . Retired    Social History Main Topics  . Smoking status: Former Smoker -- 438years    Quit date: 08/25/2013  . Smokeless tobacco: Never Used  . Alcohol Use: 7.2 oz/week    12 Cans of beer  per week     Comment: average 3 beers per day  . Drug Use: No  . Sexual Activity: Not Asked   Other Topics Concern  . None   Social History Narrative   Review of Systems - See HPI.  All other ROS are negative.  BP 140/59 mmHg  Pulse 80  Temp(Src) 98 F (36.7 C) (Oral)  Resp 16  Ht 6' (1.829 m)  Wt 209 lb 2 oz (94.858 kg)  BMI 28.36 kg/m2  SpO2 100%  Physical Exam  Constitutional: He is oriented to person, place, and time and well-developed, well-nourished, and in no distress.  HENT:  Head: Normocephalic and atraumatic.  Eyes: Conjunctivae are normal. Pupils are equal, round, and reactive to light.  Neck: Neck supple.  Cardiovascular: Normal rate, regular rhythm, normal heart sounds and intact distal pulses.   Pulmonary/Chest: Effort normal and breath sounds normal. No respiratory distress. He has no wheezes. He has no rales. He exhibits no tenderness.  Abdominal: Soft. Bowel sounds are normal. He exhibits no distension and no mass. There is no tenderness. There is no rebound and no guarding.  Neurological: He is alert and oriented to person, place, and time.  Skin: Skin is warm and dry. No rash noted.  Psychiatric: Affect normal.  Vitals reviewed.  Recent Results (from the past 2160 hour(s))  Hepatitis C Antibody     Status: None   Collection Time: 08/12/15 11:34 AM  Result Value Ref Range   HCV Ab NEGATIVE NEGATIVE  CBC     Status: None   Collection Time: 09/02/15  5:30 PM  Result Value Ref Range   WBC 6.2 4.0 - 10.5 K/uL   RBC 4.80 4.22 - 5.81 MIL/uL   Hemoglobin 15.4 13.0 - 17.0 g/dL   HCT 45.1 39.0 - 52.0 %   MCV 94.0 78.0 - 100.0 fL   MCH 32.1 26.0 -  34.0 pg   MCHC 34.1 30.0 - 36.0 g/dL   RDW 12.8 11.5 - 15.5 %   Platelets 259 150 - 400 K/uL  Basic metabolic panel     Status: None   Collection Time: 09/02/15  5:30 PM  Result Value Ref Range   Sodium 135 135 - 145 mmol/L   Potassium 3.9 3.5 - 5.1 mmol/L   Chloride 102 101 - 111 mmol/L   CO2 24 22 - 32 mmol/L   Glucose, Bld 92 65 - 99 mg/dL   BUN 19 6 - 20 mg/dL   Creatinine, Ser 1.22 0.61 - 1.24 mg/dL   Calcium 9.7 8.9 - 10.3 mg/dL   GFR calc non Af Amer >60 >60 mL/min   GFR calc Af Amer >60 >60 mL/min    Comment: (NOTE) The eGFR has been calculated using the CKD EPI equation. This calculation has not been validated in all clinical situations. eGFR's persistently <60 mL/min signify possible Chronic Kidney Disease.    Anion gap 9 5 - 15  Troponin I     Status: None   Collection Time: 09/02/15  5:30 PM  Result Value Ref Range   Troponin I <0.03 <0.031 ng/mL    Comment:        NO INDICATION OF MYOCARDIAL INJURY.   5 HIAA, quantitative, Urine, 24 hour     Status: None   Collection Time: 09/06/15  9:47 AM  Result Value Ref Range   5-HIAA, 24 Hr Urine 1.2 <=6.0 mg/24 h  Catecholamines, fractionated, urine, 24 hour     Status: None   Collection  Time: 09/06/15  9:47 AM  Result Value Ref Range   Total Volume - CF 24Hr U 1200 mL   Epinephrine, 24 hr Urine 3 2 - 24 mcg/24 h   Norepinephrine, 24 hr Ur 26 15 - 100 mcg/24 h   Calculated Total (E+NE) 29 26 - 121 mcg/24 h   Dopamine, 24 hr Urine 141 52 - 480 mcg/24 h   Creatinine, Urine mg/day-CATEUR 1.39 0.63 - 2.50 g/24 h  CBC     Status: None   Collection Time: 09/06/15  3:46 PM  Result Value Ref Range   WBC 5.5 4.0 - 10.5 K/uL   RBC 4.61 4.22 - 5.81 Mil/uL   Platelets 240.0 150.0 - 400.0 K/uL   Hemoglobin 14.7 13.0 - 17.0 g/dL   HCT 43.7 39.0 - 52.0 %   MCV 94.8 78.0 - 100.0 fl   MCHC 33.6 30.0 - 36.0 g/dL   RDW 13.3 11.5 - 15.5 %  Comp Met (CMET)     Status: Abnormal   Collection Time: 09/06/15  3:46 PM  Result Value  Ref Range   Sodium 138 135 - 145 mEq/L   Potassium 4.1 3.5 - 5.1 mEq/L   Chloride 102 96 - 112 mEq/L   CO2 27 19 - 32 mEq/L   Glucose, Bld 114 (H) 70 - 99 mg/dL   BUN 14 6 - 23 mg/dL   Creatinine, Ser 1.37 0.40 - 1.50 mg/dL   Total Bilirubin 0.5 0.2 - 1.2 mg/dL   Alkaline Phosphatase 60 39 - 117 U/L   AST 28 0 - 37 U/L   ALT 40 0 - 53 U/L   Total Protein 7.3 6.0 - 8.3 g/dL   Albumin 4.2 3.5 - 5.2 g/dL   Calcium 10.0 8.4 - 10.5 mg/dL   GFR 55.29 (L) >60.00 mL/min    Assessment/Plan: Anxiety Xanax refilled. Will increase Citalopram to 20 mg daily. Recommended counseling. Patient will give some thought.  Facial flushing Do feel this is anxiety-related. However, patient with history of melanoma and prostate cancer. Will check lab panel and assess for urine 5HIAA and urine catecholamines today. Medications titrated.  Gastroesophageal reflux disease without esophagitis Failed multiple drugs. Rx Dexilant daily. Follow-up with GI. Rx Promethazine for nausea.

## 2015-09-06 NOTE — Progress Notes (Signed)
Pre visit review using our clinic review tool, if applicable. No additional management support is needed unless otherwise documented below in the visit note/SLS  

## 2015-09-07 ENCOUNTER — Telehealth: Payer: Self-pay | Admitting: *Deleted

## 2015-09-07 LAB — COMPREHENSIVE METABOLIC PANEL
ALT: 40 U/L (ref 0–53)
AST: 28 U/L (ref 0–37)
Albumin: 4.2 g/dL (ref 3.5–5.2)
Alkaline Phosphatase: 60 U/L (ref 39–117)
BUN: 14 mg/dL (ref 6–23)
CHLORIDE: 102 meq/L (ref 96–112)
CO2: 27 meq/L (ref 19–32)
Calcium: 10 mg/dL (ref 8.4–10.5)
Creatinine, Ser: 1.37 mg/dL (ref 0.40–1.50)
GFR: 55.29 mL/min — ABNORMAL LOW (ref >60.00)
GLUCOSE: 114 mg/dL — AB (ref 70–99)
POTASSIUM: 4.1 meq/L (ref 3.5–5.1)
SODIUM: 138 meq/L (ref 135–145)
Total Bilirubin: 0.5 mg/dL (ref 0.2–1.2)
Total Protein: 7.3 g/dL (ref 6.0–8.3)

## 2015-09-07 LAB — CBC
HEMATOCRIT: 43.7 % (ref 39.0–52.0)
Hemoglobin: 14.7 g/dL (ref 13.0–17.0)
MCHC: 33.6 g/dL (ref 30.0–36.0)
MCV: 94.8 fl (ref 78.0–100.0)
Platelets: 240 10*3/uL (ref 150.0–400.0)
RBC: 4.61 Mil/uL (ref 4.22–5.81)
RDW: 13.3 % (ref 11.5–15.5)
WBC: 5.5 10*3/uL (ref 4.0–10.5)

## 2015-09-07 NOTE — Telephone Encounter (Signed)
PA approved through 10/14/2016. Referral#: [id number]. Approval letter sent for scanning. JG//CMA

## 2015-09-07 NOTE — Telephone Encounter (Signed)
PA initiated, awaiting determination. JG//CMA  

## 2015-09-12 ENCOUNTER — Other Ambulatory Visit: Payer: Medicare Other

## 2015-09-12 ENCOUNTER — Telehealth: Payer: Self-pay | Admitting: *Deleted

## 2015-09-12 NOTE — Addendum Note (Signed)
Addended by: Harl Bowie on: 09/12/2015 04:35 PM   Modules accepted: Orders

## 2015-09-16 LAB — CATECHOLAMINES, FRACTIONATED, URINE, 24 HOUR
CREATININE, URINE MG/DAY-CATEUR: 1.39 g/(24.h) (ref 0.63–2.50)
Calculated Total (E+NE): 29 mcg/24 h (ref 26–121)
Dopamine, 24 hr Urine: 141 mcg/24 h (ref 52–480)
EPINEPHRINE, 24 HR URINE: 3 ug/(24.h) (ref 2–24)
Norepinephrine, 24 hr Ur: 26 mcg/24 h (ref 15–100)
Total Volume - CF 24Hr U: 1200 mL

## 2015-09-17 LAB — 5 HIAA, QUANTITATIVE, URINE, 24 HOUR: 5-HIAA, 24 Hr Urine: 1.2 mg/24 h (ref <=6.0)

## 2015-09-19 DIAGNOSIS — F419 Anxiety disorder, unspecified: Secondary | ICD-10-CM | POA: Insufficient documentation

## 2015-09-19 DIAGNOSIS — R232 Flushing: Secondary | ICD-10-CM | POA: Insufficient documentation

## 2015-09-19 NOTE — Assessment & Plan Note (Signed)
Xanax refilled. Will increase Citalopram to 20 mg daily. Recommended counseling. Patient will give some thought.

## 2015-09-19 NOTE — Assessment & Plan Note (Signed)
Do feel this is anxiety-related. However, patient with history of melanoma and prostate cancer. Will check lab panel and assess for urine 5HIAA and urine catecholamines today. Medications titrated.

## 2015-09-19 NOTE — Assessment & Plan Note (Signed)
Failed multiple drugs. Rx Dexilant daily. Follow-up with GI. Rx Promethazine for nausea.

## 2015-09-22 ENCOUNTER — Telehealth: Payer: Self-pay | Admitting: Pulmonary Disease

## 2015-09-23 ENCOUNTER — Encounter: Payer: Self-pay | Admitting: Physician Assistant

## 2015-09-23 ENCOUNTER — Ambulatory Visit (INDEPENDENT_AMBULATORY_CARE_PROVIDER_SITE_OTHER): Payer: Medicare Other | Admitting: Physician Assistant

## 2015-09-23 VITALS — BP 110/60 | HR 79 | Temp 98.0°F | Ht 71.0 in | Wt 211.0 lb

## 2015-09-23 DIAGNOSIS — K219 Gastro-esophageal reflux disease without esophagitis: Secondary | ICD-10-CM

## 2015-09-23 DIAGNOSIS — F419 Anxiety disorder, unspecified: Secondary | ICD-10-CM | POA: Diagnosis not present

## 2015-09-23 NOTE — Telephone Encounter (Signed)
Left message for pt to call back  °

## 2015-09-23 NOTE — Patient Instructions (Signed)
Please take the Dexilant as directed 20-30 minutes before breakfast. It is ok to take a Pepcid or Zantac in the evening. Limit late night eating and alcohol consumption.  I will check on the portable oxygen and assistance for the Dexilant for you.  Please start taking your Citalopram daily. You need to take it daily for 3-4 weeks before improvement will be noted. Follow-up with me in 1 month.

## 2015-09-23 NOTE — Progress Notes (Signed)
Pre visit review using our clinic review tool, if applicable. No additional management support is needed unless otherwise documented below in the visit note. 

## 2015-09-23 NOTE — Progress Notes (Signed)
Patient presents to clinic today to follow-up regarding anxiety and GERD.  Patient able to get Dexilant approve. Notes medication was very expensive. Is wondering if there is assistance for this medication. Is taking as directed with improvement in symptoms. Is not always taking first thing in the morning. Patient endorses continued anxiety symptoms. Is only taking Celexa PRN. Tolerates medication when taking. Denies SI/HI. Occasionally with acute anxiety controlled with Xanax.  Past Medical History  Diagnosis Date  . Hypertension   . Prostate cancer ([place])   . GERD (gastroesophageal reflux disease)   . History of TB (tuberculosis)     1984--  hospitalized for 4 month treatment  . Post-polio syndrome     polio at age 31--PT WAS IN IRON LUNG; PT WAS IN W/C UNTIL AGE 4; STILL HAS WEAKNESS RIGHT SIDE  . History of chronic bronchitis   . History of rheumatic fever   . Bladder outlet obstruction   . Anxiety disorder   . History of urinary retention   . Nocturnal oxygen desaturation     USES O2 NIGHTLY  . Prostate cancer ([place])   . Complication of anesthesia     DIFFICULT WAKING   . Diabetes mellitus without complication ([place])     BODERLINE - DIET CONTROL  . Anxiety   . Shortness of breath dyspnea     RIGHT HEMIDIAPHRAGM ELEVATION - POST POLIO SYNDROME  . Dysrhythmia     PVC'S  . Arthritis     BILATERAL SHOULDERS, ELBOWS AND HANDS AND LEFT HIP AND KNEES--HX CORTISONE SHOTS IN SHOULDERS, ELBOWS, HIP AND KNEES  . History of oxygen administration     oxygen use 2 l/m nasally at bedtime and exertional occasions  . COPD, frequent exacerbations ([place])     pulmologist-  dr Joya Gaskins--  [place] Rm Stage C.04-25-15 recent COPD exacerbation-much improved now, after tx. in ER [facility].  Marland Kitchen PONV (postoperative nausea and vomiting)   . Melanoma [facility])     Current Outpatient Prescriptions on File Prior to Visit  Medication Sig Dispense Refill  . albuterol (PROVENTIL HFA;VENTOLIN HFA) 108 (90 BASE)  MCG/ACT inhaler Inhale 2 puffs into the lungs every 6 (six) hours as needed for wheezing or shortness of breath. 3 Inhaler 3  . ALPRAZolam (XANAX) 1 MG tablet Take 0.5 tablets (0.5 mg total) by mouth 3 (three) times daily as needed for anxiety. 90 tablet 0  . amLODipine (NORVASC) 10 MG tablet TAKE 1 TABLET BY MOUTH ONCE DAILY 90 tablet 1  . BREO ELLIPTA 100-25 MCG/INH AEPB INHALE 1 PUFF INTO THE LUNGS EVERY MORNING. DAILY 60 each 5  . calcium carbonate (TUMS - DOSED IN MG ELEMENTAL CALCIUM) 500 MG chewable tablet Chew 1 tablet by mouth 3 (three) times daily as needed for indigestion or heartburn.    . cetirizine (ZYRTEC) 10 MG tablet Take 1 tablet (10 mg total) by mouth daily. 30 tablet 6  . citalopram (CELEXA) 20 MG tablet Take 1 tablet (20 mg total) by mouth daily. 30 tablet 3  . fluticasone (FLONASE) 50 MCG/ACT nasal spray Place 2 sprays into both nostrils daily. 16 g 6  . ipratropium-albuterol (DUONEB) 0.5-2.5 (3) MG/3ML SOLN Take 3 mLs by nebulization every 6 (six) hours as needed. 360 mL 6  . loratadine (CLARITIN) 10 MG tablet TAKE 1 TABLET BY MOUTH EVERY DAY 30 tablet 5  . metoprolol succinate (TOPROL-XL) 25 MG 24 hr tablet TAKE 1 TABLET BY MOUTH ONCE DAILY 30 tablet 3  . mirabegron ER (MYRBETRIQ) 25 MG  TB24 tablet Take 1 tablet (25 mg total) by mouth daily. 30 tablet 1  . montelukast (SINGULAIR) 10 MG tablet TAKE 1 TABLET (10 MG TOTAL) BY MOUTH AT BEDTIME. 30 tablet 2  . nitroGLYCERIN (NITROSTAT) 0.4 MG SL tablet Place 1 tablet (0.4 mg total) under the tongue every 5 (five) minutes as needed for chest pain. 30 tablet 3  . oxyCODONE (ROXICODONE) 15 MG immediate release tablet Take 1 tablet (15 mg total) by mouth every 8 (eight) hours as needed for pain. 90 tablet 0  . OXYGEN Inhale 2 L into the lungs at bedtime.    . predniSONE (DELTASONE) 20 MG tablet Take 20 mg by mouth daily as needed (tightness in chest).     Marland Kitchen tiotropium (SPIRIVA) 18 MCG inhalation capsule Place 1 capsule (18 mcg total)  into inhaler and inhale daily. IN AM 90 capsule 1   No current facility-administered medications on file prior to visit.    Allergies  Allergen Reactions  . Ivp Dye [Iodinated Diagnostic Agents] Anaphylaxis  . Levaquin [Levofloxacin In D5w] Shortness Of Breath    In addition: sweating, chest pain, and diarrhea.   . Penicillins Anaphylaxis    Heart stops Has patient had a PCN reaction causing immediate rash, facial/tongue/throat swelling, SOB or lightheadedness with hypotension: No Has patient had a PCN reaction causing severe rash involving mucus membranes or skin necrosis: No Has patient had a PCN reaction that required hospitalization No Has patient had a PCN reaction occurring within the last 10 years: No If all of the above answers are "NO", then may proceed with Cephalosporin use.   . Aspirin Other (See Comments)    Reaction unknown  . Morphine And Related Nausea And Vomiting    Can take with zofran   . Nsaids Other (See Comments)    Difficulty breathing    Family History  Problem Relation Age of Onset  . Alzheimer's disease Father 4    Deceased  . Stomach cancer Father   . Heart attack Father   . Heart disease Father   . Skin cancer Mother     Facial-Living  . Alcohol abuse Sister     x2  . Mental illness Sister     x2  . Diabetes Maternal Aunt     x2  . Thyroid disease Maternal Aunt     x4  . Diabetes Maternal Uncle   . Tuberculosis Paternal Grandfather   . Tuberculosis Paternal Grandmother   . Alzheimer's disease Paternal Aunt   . Alzheimer's disease Paternal Uncle   . Colon cancer Neg Hx   . Colon polyps Neg Hx   . Crohn's disease Neg Hx   . Ulcerative colitis Neg Hx     Social History   Social History  . Marital Status: Married    Spouse Name: N/A  . Number of Children: N/A  . Years of Education: N/A   Occupational History  . Retired    Social History Main Topics  . Smoking status: Former Smoker -- 41 years    Quit date: 08/25/2013  .  Smokeless tobacco: Never Used  . Alcohol Use: 7.2 oz/week    12 Cans of beer per week     Comment: average 3 beers per day  . Drug Use: No  . Sexual Activity: Not Asked   Other Topics Concern  . None   Social History Narrative   Review of Systems - See HPI.  All other ROS are negative.  BP 110/60 mmHg  Pulse 79  Temp(Src) 98 F (36.7 C)  Ht '5\' 11"'  (1.803 m)  Wt 211 lb (95.709 kg)  BMI 29.44 kg/m2  SpO2 96%  Physical Exam  Constitutional: He is oriented to person, place, and time and well-developed, well-nourished, and in no distress.  HENT:  Head: Normocephalic and atraumatic.  Cardiovascular: Normal rate, regular rhythm, normal heart sounds and intact distal pulses.   Pulmonary/Chest: Effort normal and breath sounds normal. No respiratory distress. He has no wheezes. He has no rales. He exhibits no tenderness.  Neurological: He is alert and oriented to person, place, and time.  Skin: Skin is warm and dry. No rash noted.  Psychiatric: Affect normal.  Vitals reviewed.   Recent Results (from the past 2160 hour(s))  Hepatitis C Antibody     Status: None   Collection Time: 08/12/15 11:34 AM  Result Value Ref Range   HCV Ab NEGATIVE NEGATIVE  CBC     Status: None   Collection Time: 09/02/15  5:30 PM  Result Value Ref Range   WBC 6.2 4.0 - 10.5 K/uL   RBC 4.80 4.22 - 5.81 MIL/uL   Hemoglobin 15.4 13.0 - 17.0 g/dL   HCT 45.1 39.0 - 52.0 %   MCV 94.0 78.0 - 100.0 fL   MCH 32.1 26.0 - 34.0 pg   MCHC 34.1 30.0 - 36.0 g/dL   RDW 12.8 11.5 - 15.5 %   Platelets 259 150 - 400 K/uL  Basic metabolic panel     Status: None   Collection Time: 09/02/15  5:30 PM  Result Value Ref Range   Sodium 135 135 - 145 mmol/L   Potassium 3.9 3.5 - 5.1 mmol/L   Chloride 102 101 - 111 mmol/L   CO2 24 22 - 32 mmol/L   Glucose, Bld 92 65 - 99 mg/dL   BUN 19 6 - 20 mg/dL   Creatinine, Ser 1.22 0.61 - 1.24 mg/dL   Calcium 9.7 8.9 - 10.3 mg/dL   GFR calc non Af Amer >60 >60 mL/min   GFR  calc Af Amer >60 >60 mL/min    Comment: (NOTE) The eGFR has been calculated using the CKD EPI equation. This calculation has not been validated in all clinical situations. eGFR's persistently <60 mL/min signify possible Chronic Kidney Disease.    Anion gap 9 5 - 15  Troponin I     Status: None   Collection Time: 09/02/15  5:30 PM  Result Value Ref Range   Troponin I <0.03 <0.031 ng/mL    Comment:        NO INDICATION OF MYOCARDIAL INJURY.   5 HIAA, quantitative, Urine, 24 hour     Status: None   Collection Time: 09/06/15  9:47 AM  Result Value Ref Range   5-HIAA, 24 Hr Urine 1.2 <=6.0 mg/24 h  Catecholamines, fractionated, urine, 24 hour     Status: None   Collection Time: 09/06/15  9:47 AM  Result Value Ref Range   Total Volume - CF 24Hr U 1200 mL   Epinephrine, 24 hr Urine 3 2 - 24 mcg/24 h   Norepinephrine, 24 hr Ur 26 15 - 100 mcg/24 h   Calculated Total (E+NE) 29 26 - 121 mcg/24 h   Dopamine, 24 hr Urine 141 52 - 480 mcg/24 h   Creatinine, Urine mg/day-CATEUR 1.39 0.63 - 2.50 g/24 h  CBC     Status: None   Collection Time: 09/06/15  3:46 PM  Result Value Ref Range  WBC 5.5 4.0 - 10.5 K/uL   RBC 4.61 4.22 - 5.81 Mil/uL   Platelets 240.0 150.0 - 400.0 K/uL   Hemoglobin 14.7 13.0 - 17.0 g/dL   HCT 43.7 39.0 - 52.0 %   MCV 94.8 78.0 - 100.0 fl   MCHC 33.6 30.0 - 36.0 g/dL   RDW 13.3 11.5 - 15.5 %  Comp Met (CMET)     Status: Abnormal   Collection Time: 09/06/15  3:46 PM  Result Value Ref Range   Sodium 138 135 - 145 mEq/L   Potassium 4.1 3.5 - 5.1 mEq/L   Chloride 102 96 - 112 mEq/L   CO2 27 19 - 32 mEq/L   Glucose, Bld 114 (H) 70 - 99 mg/dL   BUN 14 6 - 23 mg/dL   Creatinine, Ser 1.37 0.40 - 1.50 mg/dL   Total Bilirubin 0.5 0.2 - 1.2 mg/dL   Alkaline Phosphatase 60 39 - 117 U/L   AST 28 0 - 37 U/L   ALT 40 0 - 53 U/L   Total Protein 7.3 6.0 - 8.3 g/dL   Albumin 4.2 3.5 - 5.2 g/dL   Calcium 10.0 8.4 - 10.5 mg/dL   GFR 55.29 (L) >60.00 mL/min  CBC with  Differential/Platelet     Status: None   Collection Time: 09/28/15  7:56 AM  Result Value Ref Range   WBC 7.7 4.0 - 10.5 K/uL   RBC 4.44 4.22 - 5.81 MIL/uL   Hemoglobin 14.0 13.0 - 17.0 g/dL   HCT 42.1 39.0 - 52.0 %   MCV 94.8 78.0 - 100.0 fL   MCH 31.5 26.0 - 34.0 pg   MCHC 33.3 30.0 - 36.0 g/dL   RDW 12.6 11.5 - 15.5 %   Platelets 309 150 - 400 K/uL   Neutrophils Relative % 76 %   Neutro Abs 5.8 1.7 - 7.7 K/uL   Lymphocytes Relative 12 %   Lymphs Abs 0.9 0.7 - 4.0 K/uL   Monocytes Relative 9 %   Monocytes Absolute 0.7 0.1 - 1.0 K/uL   Eosinophils Relative 3 %   Eosinophils Absolute 0.2 0.0 - 0.7 K/uL   Basophils Relative 0 %   Basophils Absolute 0.0 0.0 - 0.1 K/uL  Basic metabolic panel     Status: Abnormal   Collection Time: 09/28/15  7:56 AM  Result Value Ref Range   Sodium 134 (L) 135 - 145 mmol/L   Potassium 4.2 3.5 - 5.1 mmol/L   Chloride 101 101 - 111 mmol/L   CO2 26 22 - 32 mmol/L   Glucose, Bld 147 (H) 65 - 99 mg/dL   BUN 10 6 - 20 mg/dL   Creatinine, Ser 1.10 0.61 - 1.24 mg/dL   Calcium 8.9 8.9 - 10.3 mg/dL   GFR calc non Af Amer >60 >60 mL/min   GFR calc Af Amer >60 >60 mL/min    Comment: (NOTE) The eGFR has been calculated using the CKD EPI equation. This calculation has not been validated in all clinical situations. eGFR's persistently <60 mL/min signify possible Chronic Kidney Disease.    Anion gap 7 5 - 15  I-Stat CG4 Lactic Acid, ED     Status: None   Collection Time: 09/28/15  8:10 AM  Result Value Ref Range   Lactic Acid, Venous 1.49 0.5 - 2.0 mmol/L  Urinalysis, Routine w reflex microscopic (not at [facility])     Status: Abnormal   Collection Time: 09/28/15  9:02 AM  Result Value Ref Range  Color, Urine YELLOW YELLOW   APPearance CLOUDY (A) CLEAR   Specific Gravity, Urine 1.013 1.005 - 1.030   pH 5.5 5.0 - 8.0   Glucose, UA NEGATIVE NEGATIVE mg/dL   Hgb urine dipstick NEGATIVE NEGATIVE   Bilirubin Urine NEGATIVE NEGATIVE   Ketones, ur NEGATIVE  NEGATIVE mg/dL   Protein, ur NEGATIVE NEGATIVE mg/dL   Nitrite NEGATIVE NEGATIVE   Leukocytes, UA NEGATIVE NEGATIVE    Comment: MICROSCOPIC NOT DONE ON URINES WITH NEGATIVE PROTEIN, BLOOD, LEUKOCYTES, NITRITE, OR GLUCOSE <1000 mg/dL.  Urine culture     Status: None   Collection Time: 09/28/15  9:02 AM  Result Value Ref Range   Specimen Description URINE, CLEAN CATCH    Special Requests NONE    Culture      10,000 COLONIES/mL STAPHYLOCOCCUS SPECIES (COAGULASE NEGATIVE) Performed at [facility]    Report Status 09/30/2015 FINAL    Organism ID, Bacteria STAPHYLOCOCCUS SPECIES (COAGULASE NEGATIVE)       Susceptibility   Staphylococcus species (coagulase negative) - MIC*    CIPROFLOXACIN <=0.5 SENSITIVE Sensitive     GENTAMICIN <=0.5 SENSITIVE Sensitive     NITROFURANTOIN <=16 SENSITIVE Sensitive     OXACILLIN >=4 RESISTANT Resistant     TETRACYCLINE <=1 SENSITIVE Sensitive     VANCOMYCIN <=0.5 SENSITIVE Sensitive     TRIMETH/SULFA <=10 SENSITIVE Sensitive     CLINDAMYCIN <=0.25 SENSITIVE Sensitive     RIFAMPIN <=0.5 SENSITIVE Sensitive     Inducible Clindamycin NEGATIVE Sensitive     * 10,000 COLONIES/mL STAPHYLOCOCCUS SPECIES (COAGULASE NEGATIVE)    Assessment/Plan: Anxiety Discussed how Citalopram works and the need for daily dosing to see results. Patient will begin taking daily as directed. Continue Xanax as directed. Follow-up 1-2 months.  Gastroesophageal reflux disease without esophagitis Continue Dexilant. Instructed patient when to take medication every day. Zantac for breakthrough symptoms. Again discussed recommendation for him to follow-up with his GI. Will work on patient assistance for medication.

## 2015-09-26 NOTE — Telephone Encounter (Signed)
LM for pt x 2 

## 2015-09-27 NOTE — Telephone Encounter (Signed)
Patient is on my list to call and setup an appt to pick up a HST machine.  We had tried to reach pt previously (3 messages left) without any success to schedule HST ( see phone message dated 08/23/15).

## 2015-09-27 NOTE — Telephone Encounter (Signed)
Left message for patient to call back  

## 2015-09-28 ENCOUNTER — Telehealth: Payer: Self-pay | Admitting: *Deleted

## 2015-09-28 ENCOUNTER — Emergency Department (HOSPITAL_COMMUNITY)
Admission: EM | Admit: 2015-09-28 | Discharge: 2015-09-28 | Disposition: A | Discharge status: Home / Self Care | Payer: Medicare Other | Attending: Emergency Medicine | Admitting: Emergency Medicine

## 2015-09-28 ENCOUNTER — Other Ambulatory Visit: Payer: Self-pay | Admitting: Physician Assistant

## 2015-09-28 ENCOUNTER — Encounter (HOSPITAL_COMMUNITY): Payer: Self-pay | Admitting: *Deleted

## 2015-09-28 DIAGNOSIS — Z8546 Personal history of malignant neoplasm of prostate: Secondary | ICD-10-CM | POA: Diagnosis not present

## 2015-09-28 DIAGNOSIS — M19021 Primary osteoarthritis, right elbow: Secondary | ICD-10-CM | POA: Insufficient documentation

## 2015-09-28 DIAGNOSIS — R112 Nausea with vomiting, unspecified: Secondary | ICD-10-CM | POA: Diagnosis not present

## 2015-09-28 DIAGNOSIS — R52 Pain, unspecified: Secondary | ICD-10-CM | POA: Diagnosis not present

## 2015-09-28 DIAGNOSIS — M19012 Primary osteoarthritis, left shoulder: Secondary | ICD-10-CM | POA: Diagnosis not present

## 2015-09-28 DIAGNOSIS — Z87891 Personal history of nicotine dependence: Secondary | ICD-10-CM | POA: Diagnosis not present

## 2015-09-28 DIAGNOSIS — I10 Essential (primary) hypertension: Secondary | ICD-10-CM | POA: Insufficient documentation

## 2015-09-28 DIAGNOSIS — M1612 Unilateral primary osteoarthritis, left hip: Secondary | ICD-10-CM | POA: Diagnosis not present

## 2015-09-28 DIAGNOSIS — N410 Acute prostatitis: Secondary | ICD-10-CM | POA: Diagnosis not present

## 2015-09-28 DIAGNOSIS — K219 Gastro-esophageal reflux disease without esophagitis: Secondary | ICD-10-CM | POA: Diagnosis not present

## 2015-09-28 DIAGNOSIS — Z8611 Personal history of tuberculosis: Secondary | ICD-10-CM | POA: Diagnosis not present

## 2015-09-28 DIAGNOSIS — M170 Bilateral primary osteoarthritis of knee: Secondary | ICD-10-CM | POA: Insufficient documentation

## 2015-09-28 DIAGNOSIS — Z79899 Other long term (current) drug therapy: Secondary | ICD-10-CM | POA: Insufficient documentation

## 2015-09-28 DIAGNOSIS — M19022 Primary osteoarthritis, left elbow: Secondary | ICD-10-CM | POA: Insufficient documentation

## 2015-09-28 DIAGNOSIS — M19011 Primary osteoarthritis, right shoulder: Secondary | ICD-10-CM | POA: Diagnosis not present

## 2015-09-28 DIAGNOSIS — Z8619 Personal history of other infectious and parasitic diseases: Secondary | ICD-10-CM | POA: Insufficient documentation

## 2015-09-28 DIAGNOSIS — J441 Chronic obstructive pulmonary disease with (acute) exacerbation: Secondary | ICD-10-CM | POA: Insufficient documentation

## 2015-09-28 DIAGNOSIS — Z7951 Long term (current) use of inhaled steroids: Secondary | ICD-10-CM | POA: Insufficient documentation

## 2015-09-28 DIAGNOSIS — Z8669 Personal history of other diseases of the nervous system and sense organs: Secondary | ICD-10-CM | POA: Diagnosis not present

## 2015-09-28 DIAGNOSIS — Z85820 Personal history of malignant melanoma of skin: Secondary | ICD-10-CM | POA: Insufficient documentation

## 2015-09-28 DIAGNOSIS — F419 Anxiety disorder, unspecified: Secondary | ICD-10-CM | POA: Diagnosis not present

## 2015-09-28 DIAGNOSIS — R109 Unspecified abdominal pain: Secondary | ICD-10-CM | POA: Diagnosis present

## 2015-09-28 DIAGNOSIS — Z880 Allergy status to penicillin: Secondary | ICD-10-CM | POA: Diagnosis not present

## 2015-09-28 LAB — URINALYSIS, ROUTINE W REFLEX MICROSCOPIC
BILIRUBIN URINE: NEGATIVE
GLUCOSE, UA: NEGATIVE mg/dL
HGB URINE DIPSTICK: NEGATIVE
Ketones, ur: NEGATIVE mg/dL
Leukocytes, UA: NEGATIVE
Nitrite: NEGATIVE
PH: 5.5 (ref 5.0–8.0)
Protein, ur: NEGATIVE mg/dL
SPECIFIC GRAVITY, URINE: 1.013 (ref 1.005–1.030)

## 2015-09-28 LAB — CBC WITH DIFFERENTIAL/PLATELET
BASOS PCT: 0 %
Basophils Absolute: 0 10*3/uL (ref 0.0–0.1)
EOS ABS: 0.2 10*3/uL (ref 0.0–0.7)
Eosinophils Relative: 3 %
HEMATOCRIT: 42.1 % (ref 39.0–52.0)
Hemoglobin: 14 g/dL (ref 13.0–17.0)
Lymphocytes Relative: 12 %
Lymphs Abs: 0.9 10*3/uL (ref 0.7–4.0)
MCH: 31.5 pg (ref 26.0–34.0)
MCHC: 33.3 g/dL (ref 30.0–36.0)
MCV: 94.8 fL (ref 78.0–100.0)
MONO ABS: 0.7 10*3/uL (ref 0.1–1.0)
MONOS PCT: 9 %
Neutro Abs: 5.8 10*3/uL (ref 1.7–7.7)
Neutrophils Relative %: 76 %
Platelets: 309 10*3/uL (ref 150–400)
RBC: 4.44 MIL/uL (ref 4.22–5.81)
RDW: 12.6 % (ref 11.5–15.5)
WBC: 7.7 10*3/uL (ref 4.0–10.5)

## 2015-09-28 LAB — BASIC METABOLIC PANEL
Anion gap: 7 (ref 5–15)
BUN: 10 mg/dL (ref 6–20)
CALCIUM: 8.9 mg/dL (ref 8.9–10.3)
CO2: 26 mmol/L (ref 22–32)
CREATININE: 1.1 mg/dL (ref 0.61–1.24)
Chloride: 101 mmol/L (ref 101–111)
GFR calc non Af Amer: >60 mL/min (ref >60)
Glucose, Bld: 147 mg/dL — ABNORMAL HIGH (ref 65–99)
Potassium: 4.2 mmol/L (ref 3.5–5.1)
Sodium: 134 mmol/L — ABNORMAL LOW (ref 135–145)

## 2015-09-28 LAB — I-STAT CG4 LACTIC ACID, ED: Lactic Acid, Venous: 1.49 mmol/L (ref 0.5–2.0)

## 2015-09-28 MED ORDER — TAMSULOSIN HCL 0.4 MG PO CAPS: 0.4000 mg | ORAL_CAPSULE | Freq: Every day | ORAL | Status: DC

## 2015-09-28 MED ORDER — SODIUM CHLORIDE 0.9 % IV BOLUS (SEPSIS)
1000.0000 mL | Freq: Once | INTRAVENOUS | Status: AC
  Administered 2015-09-28: 1000 mL via INTRAVENOUS

## 2015-09-28 MED ORDER — ONDANSETRON HCL 4 MG/2ML IJ SOLN
4.0000 mg | Freq: Once | INTRAMUSCULAR | Status: AC
Start: 2015-09-28 — End: 2015-09-28
  Administered 2015-09-28: 4 mg via INTRAVENOUS
  Filled 2015-09-28: qty 2

## 2015-09-28 MED ORDER — SULFAMETHOXAZOLE-TRIMETHOPRIM 800-160 MG PO TABS: 1.0000 | ORAL_TABLET | Freq: Two times a day (BID) | ORAL | Status: AC

## 2015-09-28 MED ORDER — PROMETHAZINE HCL 25 MG/ML IJ SOLN
12.5000 mg | Freq: Once | INTRAMUSCULAR | Status: AC
  Administered 2015-09-28: 12.5 mg via INTRAVENOUS
  Filled 2015-09-28: qty 1

## 2015-09-28 MED ORDER — ONDANSETRON 4 MG PO TBDP: 4.0000 mg | ORAL_TABLET | Freq: Three times a day (TID) | ORAL | Status: DC | PRN

## 2015-09-28 MED ORDER — FENTANYL CITRATE (PF) 100 MCG/2ML IJ SOLN
50.0000 ug | INTRAMUSCULAR | Status: AC | PRN
  Administered 2015-09-28 (×2): 50 ug via INTRAVENOUS
  Filled 2015-09-28: qty 2

## 2015-09-28 MED ORDER — SULFAMETHOXAZOLE-TRIMETHOPRIM 800-160 MG PO TABS
1.0000 | ORAL_TABLET | Freq: Once | ORAL | Status: AC
  Administered 2015-09-28: 1 via ORAL
  Filled 2015-09-28: qty 1

## 2015-09-28 MED ORDER — DEXLANSOPRAZOLE 60 MG PO CPDR: 60.0000 mg | DELAYED_RELEASE_CAPSULE | Freq: Every day | ORAL | Status: DC

## 2015-09-28 NOTE — ED Notes (Signed)
Bed: [id number] Expected date:  Expected time:  Means of arrival:  Comments: 57M R Flank pain, n/v, urinary retention

## 2015-09-28 NOTE — ED Notes (Signed)
Pt given d/c instructions, verbalized understanding. 

## 2015-09-28 NOTE — ED Provider Notes (Signed)
CSN: [id number]     Arrival date & time 09/28/15  0703 History   First MD Initiated Contact with Patient 09/28/15 [phone]     Chief Complaint  Patient presents with  . Flank Pain      HPI  Patient resents for evaluation. He states "I ain't got no spit".  Patient states that he started having chills 4 days ago. Decreased urination yesterday and this morning. States he has a difficult and weak stream and chills and nausea after he passes his urine. He does not feel as though he is retaining urine but admits it is more difficult.  Temperature home without that it was normal. No increased difficulty breathing or change in his baseline respirations. History of prostate cancer. He states it was treated with a urinary stent. He follows with Dr.Wren at [facility] urology  Past Medical History  Diagnosis Date  . Hypertension   . Prostate cancer ([place])   . GERD (gastroesophageal reflux disease)   . History of TB (tuberculosis)     1984--  hospitalized for 4 month treatment  . Post-polio syndrome     polio at age 48--PT WAS IN IRON LUNG; PT WAS IN W/C UNTIL AGE 26; STILL HAS WEAKNESS RIGHT SIDE  . History of chronic bronchitis   . History of rheumatic fever   . Bladder outlet obstruction   . Anxiety disorder   . History of urinary retention   . Nocturnal oxygen desaturation     USES O2 NIGHTLY  . Prostate cancer ([place])   . Complication of anesthesia     DIFFICULT WAKING   . Diabetes mellitus without complication ([place])     BODERLINE - DIET CONTROL  . Anxiety   . Shortness of breath dyspnea     RIGHT HEMIDIAPHRAGM ELEVATION - POST POLIO SYNDROME  . Dysrhythmia     PVC'S  . Arthritis     BILATERAL SHOULDERS, ELBOWS AND HANDS AND LEFT HIP AND KNEES--HX CORTISONE SHOTS IN SHOULDERS, ELBOWS, HIP AND KNEES  . History of oxygen administration     oxygen use 2 l/m nasally at bedtime and exertional occasions  . COPD, frequent exacerbations ([place])     pulmologist-  dr Joya Gaskins--  [place] Rm Stage C.04-25-15  recent COPD exacerbation-much improved now, after tx. in ER [facility].  Marland Kitchen PONV (postoperative nausea and vomiting)   . Melanoma [facility])    Past Surgical History  Procedure Laterality Date  . Other surgical history       Muscle & bone Graft/Polio  . Cystoscopy w/ cystogram/  transrectal ultrasound prostate bx  03-22-2009  . Cardiovascular stress test  06-08-2014  dr Mare Ferrari    normal lexiscan study/  no ischemia/  not gated due to PAC's  . Ureterosopy stone extraction  2000  . Nasal septum surgery  2000  . Shoulder arthroscopy with open rotator cuff repair Bilateral 2013  &  1999    removal spurs and bursectomy  . Laparoscopic cholecystectomy  2013  . Prostate biopsy N/A 09/28/2014    Procedure: PROSTATE ULTRASOUND/BIOPSY;  Surgeon: [name], MD;  Location: [facility] ORS;  Service: Urology;  Laterality: N/A;  . Transurethral resection of prostate N/A 09/28/2014    Procedure: TRANSURETHRAL RESECTION OF THE PROSTATE (TURP);  Surgeon: [name], MD;  Location: [facility] ORS;  Service: Urology;  Laterality: N/A;  . Esophagogastroduodenoscopy N/A 03/22/2015    Procedure: ESOPHAGOGASTRODUODENOSCOPY (EGD) with dilation;  Surgeon: [name], MD;  Location: [facility] ENDOS[facility];  Service: Endoscopy;  Laterality:  N/A;  Azzie Almas dilation N/A 03/22/2015    Procedure: SAVORY DILATION;  Surgeon: [name], MD;  Location: [facility] ENDOSCOPY;  Service: Endoscopy;  Laterality: N/A;  . Colonoscopy N/A 05/03/2015    Procedure: COLONOSCOPY;  Surgeon: [name], MD;  Location: [facility] ENDOS[facility];  Service: Endoscopy;  Laterality: N/A;   Family History  Problem Relation Age of Onset  . Alzheimer's disease Father 4    Deceased  . Stomach cancer Father   . Heart attack Father   . Heart disease Father   . Skin cancer Mother     Facial-Living  . Alcohol abuse Sister     x2  . Mental illness Sister     x2  . Diabetes Maternal Aunt     x2  . Thyroid disease Maternal Aunt     x4  . Diabetes Maternal Uncle   .  Tuberculosis Paternal Grandfather   . Tuberculosis Paternal Grandmother   . Alzheimer's disease Paternal Aunt   . Alzheimer's disease Paternal Uncle   . Colon cancer Neg Hx   . Colon polyps Neg Hx   . Crohn's disease Neg Hx   . Ulcerative colitis Neg Hx    Social History  Substance Use Topics  . Smoking status: Former Smoker -- 68 years    Quit date: 08/25/2013  . Smokeless tobacco: Never Used  . Alcohol Use: 7.2 oz/week    12 Cans of beer per week     Comment: average 3 beers per day    Review of Systems  Constitutional: Positive for chills. Negative for fever, diaphoresis, appetite change and fatigue.  HENT: Negative for mouth sores, sore throat and trouble swallowing.   Eyes: Negative for visual disturbance.  Respiratory: Negative for cough, chest tightness, shortness of breath and wheezing.   Cardiovascular: Negative for chest pain.  Gastrointestinal: Positive for nausea and vomiting. Negative for abdominal pain, diarrhea and abdominal distention.  Endocrine: Negative for polydipsia, polyphagia and polyuria.  Genitourinary: Positive for dysuria, decreased urine volume and difficulty urinating. Negative for frequency and hematuria.  Musculoskeletal: Negative for gait problem.  Skin: Negative for color change, pallor and rash.  Neurological: Negative for dizziness, syncope, light-headedness and headaches.  Hematological: Does not bruise/bleed easily.  Psychiatric/Behavioral: Negative for behavioral problems and confusion.      Allergies  Ivp dye; Levaquin; Penicillins; Aspirin; Morphine and related; and Nsaids  Home Medications   Prior to Admission medications   Medication Sig Start Date End Date Taking? Authorizing Provider  acetaminophen (TYLENOL) 500 MG tablet Take 1,000 mg by mouth every 6 (six) hours as needed for moderate pain or headache.   Yes Historical Provider, MD  albuterol (PROVENTIL HFA;VENTOLIN HFA) 108 (90 BASE) MCG/ACT inhaler Inhale 2 puffs into the  lungs every 6 (six) hours as needed for wheezing or shortness of breath. 05/05/15  Yes Brunetta Jeans, PA-C  ALPRAZolam [name]) 1 MG tablet Take 0.5 tablets (0.5 mg total) by mouth 3 (three) times daily as needed for anxiety. 09/06/15  Yes Brunetta Jeans, PA-C  amLODipine (NORVASC) 10 MG tablet TAKE 1 TABLET BY MOUTH ONCE DAILY 03/01/15  Yes Brunetta Jeans, PA-C  BREO ELLIPTA 100-25 MCG/INH AEPB INHALE 1 PUFF INTO THE LUNGS EVERY MORNING. DAILY 08/08/15  Yes Brunetta Jeans, PA-C  calcium carbonate (TUMS - DOSED IN MG ELEMENTAL CALCIUM) 500 MG chewable tablet Chew 1 tablet by mouth 3 (three) times daily as needed for indigestion or heartburn.   Yes Historical Provider, MD  cetirizine (ZYRTEC) 10 MG tablet Take 1 tablet (10 mg total) by mouth daily. 08/03/15  Yes Brunetta Jeans, PA-C  citalopram (CELEXA) 10 MG tablet Take 10 mg by mouth daily. 06/21/15  Yes Historical Provider, MD  dexlansoprazole (DEXILANT) 60 MG capsule Take 1 capsule (60 mg total) by mouth daily. 09/06/15  Yes Brunetta Jeans, PA-C  fluticasone (FLONASE) 50 MCG/ACT nasal spray Place 2 sprays into both nostrils daily. 08/03/15  Yes Brunetta Jeans, PA-C  ipratropium-albuterol (DUONEB) 0.5-2.5 (3) MG/3ML SOLN Take 3 mLs by nebulization every 6 (six) hours as needed. 08/03/15  Yes Brunetta Jeans, PA-C  loratadine (CLARITIN) 10 MG tablet TAKE 1 TABLET BY MOUTH EVERY DAY 05/14/15  Yes Brunetta Jeans, PA-C  metoprolol succinate (TOPROL-XL) 25 MG 24 hr tablet TAKE 1 TABLET BY MOUTH ONCE DAILY 01/26/15  Yes Brunetta Jeans, PA-C  montelukast (SINGULAIR) 10 MG tablet TAKE 1 TABLET (10 MG TOTAL) BY MOUTH AT BEDTIME. 05/23/15  Yes Elsie Stain, MD  neomycin-bacitracin-polymyxin (NEOSPORIN) ointment Apply 1 application topically as needed for wound care. apply to eye   Yes Historical Provider, MD  nitroGLYCERIN (NITROSTAT) 0.4 MG SL tablet Place 1 tablet (0.4 mg total) under the tongue every 5 (five) minutes as needed for chest pain.  06/21/15  Yes Brunetta Jeans, PA-C  oxyCODONE (ROXICODONE) 15 MG immediate release tablet Take 1 tablet (15 mg total) by mouth every 8 (eight) hours as needed for pain. 09/06/15  Yes Brunetta Jeans, PA-C  oxymetazoline (AFRIN) 0.05 % nasal spray Place 3 sprays into both nostrils 2 (two) times daily as needed for congestion.   Yes Historical Provider, MD  predniSONE (DELTASONE) 10 MG tablet Take 10 mg by mouth daily as needed (chest tightness).   Yes Historical Provider, MD  predniSONE (DELTASONE) 20 MG tablet Take 20 mg by mouth daily as needed (tightness in chest).    Yes Historical Provider, MD  tiotropium (SPIRIVA) 18 MCG inhalation capsule Place 1 capsule (18 mcg total) into inhaler and inhale daily. IN AM 05/05/15  Yes Brunetta Jeans, PA-C  citalopram (CELEXA) 20 MG tablet Take 1 tablet (20 mg total) by mouth daily. 09/06/15   Brunetta Jeans, PA-C  mirabegron ER (MYRBETRIQ) 25 MG TB24 tablet Take 1 tablet (25 mg total) by mouth daily. 07/26/15   Brunetta Jeans, PA-C  ondansetron (ZOFRAN ODT) 4 MG disintegrating tablet Take 1 tablet (4 mg total) by mouth every 8 (eight) hours as needed for nausea. 09/28/15   Tanna Furry, MD  OXYGEN Inhale 2 L into the lungs at bedtime.    Historical Provider, MD  sulfamethoxazole-trimethoprim (BACTRIM DS,SEPTRA DS) 800-160 MG tablet Take 1 tablet by mouth 2 (two) times daily. 09/28/15 10/05/15  Tanna Furry, MD  tamsulosin (FLOMAX) 0.4 MG CAPS capsule Take 1 capsule (0.4 mg total) by mouth daily. 09/28/15   Tanna Furry, MD   BP 130/52 mmHg  Pulse 74  Temp(Src) 98.2 F (36.8 C) (Oral)  Resp 16  SpO2 96% Physical Exam  Constitutional: He is oriented to person, place, and time. He appears well-developed and well-nourished. No distress.  HENT:  Head: Normocephalic.  Eyes: Conjunctivae are normal. Pupils are equal, round, and reactive to light. No scleral icterus.  Neck: Normal range of motion. Neck supple. No thyromegaly present.  Cardiovascular: Normal  rate and regular rhythm.  Exam reveals no gallop and no friction rub.   No murmur heard. Pulmonary/Chest: Effort normal and breath sounds normal. No respiratory distress.  He has no wheezes. He has no rales.  Abdominal: Soft. Bowel sounds are normal. He exhibits no distension. There is no tenderness. There is no rebound.  No palpable distention of the Urinary bladder.  Musculoskeletal: Normal range of motion.  Neurological: He is alert and oriented to person, place, and time.  Skin: Skin is warm and dry. No rash noted.  Psychiatric: He has a normal mood and affect. His behavior is normal.    ED Course  Procedures (including critical care time) Labs Review Labs Reviewed  BASIC METABOLIC PANEL - Abnormal; Notable for the following:    Sodium 134 (*)    Glucose, Bld 147 (*)    All other components within normal limits  URINALYSIS, ROUTINE W REFLEX MICROSCOPIC (NOT AT [facility]) - Abnormal; Notable for the following:    APPearance CLOUDY (*)    All other components within normal limits  URINE CULTURE  CBC WITH DIFFERENTIAL/PLATELET  I-STAT CG4 LACTIC ACID, ED    Imaging Review No results found. I have personally reviewed and evaluated these images and lab results as part of my medical decision-making.   EKG Interpretation None      MDM   Final diagnoses:  Acute prostatitis    Kidney stone. No obvious urinary abnormalities suggest cystitis/pyelonephritis. Very likely an acute prostatitis with his urinary hesitancy. Is able to urinate. Not retaining urine. Plan his Flomax, Bactrim, urology follow-up. Zofran for nausea. ER with acute changes. Drink and stay hydrated.    Tanna Furry, MD 09/28/15 [phone]

## 2015-09-28 NOTE — Discharge Instructions (Signed)

## 2015-09-28 NOTE — Telephone Encounter (Signed)
Spoke with patient RE: Patient Assistance via [place] for [employer] d/t cost of medication; pt just out of ED for prostatitis and I will be out of the office tomorrow, so patient will come in on Friday to go over completion of paperwork with me/SLS

## 2015-09-28 NOTE — ED Notes (Signed)
Pt arrives to the ER via EMS for complaints of rt flank pain; pt states that he has not felt well for several days and has had chills; pt states that over the last 2 days he has had dull ache to rt flank area; pt states that the pain got progressively worse last night and that he took 7.5mg  Oxycontin; pt states that he was able to go to sleep after the pain medication but woke up around 3:30 this am with intense pain; pt states that he feels nauseous when he has to go urinate and has vomited x 2 this am; pt states that he has had a kidney stone in the past but it was 83yrs ago; pt states that he feels like he needs to strain to urinate; pt denies hematuria

## 2015-09-30 LAB — URINE CULTURE

## 2015-10-02 ENCOUNTER — Telehealth (HOSPITAL_COMMUNITY): Payer: Self-pay

## 2015-10-02 NOTE — Telephone Encounter (Signed)
Post ED Visit - Positive Culture Follow-up  Culture report reviewed by antimicrobial stewardship pharmacist:  []  Elenor Quinones, Pharm.D. []  Heide Guile, Pharm.D., BCPS []  Parks Neptune, Pharm.D. []  Alycia Rossetti, Pharm.D., BCPS [x]  [place], [place].D., BCPS, AAHIVP []  Legrand Como, Pharm.D., BCPS, AAHIVP []  Milus Glazier, Pharm.D. []  Stephens November, Pharm.D.  Positive urine culture,  10,000 colonies -> Staph Species Treated with Sulfa Trimeth organism sensitive to the same and no further patient follow-up is required at this time.  Dortha Kern 10/02/2015, 6:01 AM

## 2015-10-03 ENCOUNTER — Telehealth (HOSPITAL_BASED_OUTPATIENT_CLINIC_OR_DEPARTMENT_OTHER): Payer: Self-pay | Admitting: Emergency Medicine

## 2015-10-03 NOTE — Telephone Encounter (Signed)
Post ED Visit - Positive Culture Follow-up  Culture report reviewed by antimicrobial stewardship pharmacist:  []  Elenor Quinones, Pharm.D. [x]  Heide Guile, Pharm.D., BCPS []  Parks Neptune, Pharm.D. []  Alycia Rossetti, Pharm.D., BCPS []  [place], Pharm.D., BCPS, AAHIVP []  Legrand Como, Pharm.D., BCPS, AAHIVP []  Milus Glazier, Pharm.D. []  Stephens November, Pharm.D.  Positive urine culture Staph Treated with bactrim DS, organism sensitive to the same and no further patient follow-up is required at this time.  Hazle Nordmann 10/03/2015, 12:22 PM

## 2015-10-03 NOTE — Assessment & Plan Note (Addendum)
Continue Dexilant. Instructed patient when to take medication every day. Zantac for breakthrough symptoms. Again discussed recommendation for him to follow-up with his GI. Will work on patient assistance for medication.

## 2015-10-03 NOTE — Assessment & Plan Note (Signed)
Discussed how Citalopram works and the need for daily dosing to see results. Patient will begin taking daily as directed. Continue Xanax as directed. Follow-up 1-2 months.

## 2015-10-04 ENCOUNTER — Emergency Department (HOSPITAL_COMMUNITY)
Admission: EM | Admit: 2015-10-04 | Discharge: 2015-10-05 | Disposition: A | Discharge status: Home / Self Care | Payer: Medicare Other | Attending: Emergency Medicine | Admitting: Emergency Medicine

## 2015-10-04 ENCOUNTER — Encounter (HOSPITAL_COMMUNITY): Payer: Self-pay | Admitting: *Deleted

## 2015-10-04 DIAGNOSIS — Z8546 Personal history of malignant neoplasm of prostate: Secondary | ICD-10-CM | POA: Insufficient documentation

## 2015-10-04 DIAGNOSIS — M170 Bilateral primary osteoarthritis of knee: Secondary | ICD-10-CM | POA: Diagnosis not present

## 2015-10-04 DIAGNOSIS — Z880 Allergy status to penicillin: Secondary | ICD-10-CM | POA: Diagnosis not present

## 2015-10-04 DIAGNOSIS — M19012 Primary osteoarthritis, left shoulder: Secondary | ICD-10-CM | POA: Insufficient documentation

## 2015-10-04 DIAGNOSIS — Z87448 Personal history of other diseases of urinary system: Secondary | ICD-10-CM | POA: Diagnosis not present

## 2015-10-04 DIAGNOSIS — R300 Dysuria: Secondary | ICD-10-CM | POA: Diagnosis not present

## 2015-10-04 DIAGNOSIS — M19021 Primary osteoarthritis, right elbow: Secondary | ICD-10-CM | POA: Insufficient documentation

## 2015-10-04 DIAGNOSIS — F419 Anxiety disorder, unspecified: Secondary | ICD-10-CM | POA: Insufficient documentation

## 2015-10-04 DIAGNOSIS — Z9981 Dependence on supplemental oxygen: Secondary | ICD-10-CM | POA: Insufficient documentation

## 2015-10-04 DIAGNOSIS — Z85820 Personal history of malignant melanoma of skin: Secondary | ICD-10-CM | POA: Insufficient documentation

## 2015-10-04 DIAGNOSIS — M19011 Primary osteoarthritis, right shoulder: Secondary | ICD-10-CM | POA: Insufficient documentation

## 2015-10-04 DIAGNOSIS — I10 Essential (primary) hypertension: Secondary | ICD-10-CM | POA: Insufficient documentation

## 2015-10-04 DIAGNOSIS — Z87891 Personal history of nicotine dependence: Secondary | ICD-10-CM | POA: Diagnosis not present

## 2015-10-04 DIAGNOSIS — Z7951 Long term (current) use of inhaled steroids: Secondary | ICD-10-CM | POA: Insufficient documentation

## 2015-10-04 DIAGNOSIS — M19022 Primary osteoarthritis, left elbow: Secondary | ICD-10-CM | POA: Diagnosis not present

## 2015-10-04 DIAGNOSIS — R350 Frequency of micturition: Secondary | ICD-10-CM | POA: Diagnosis not present

## 2015-10-04 DIAGNOSIS — Z8611 Personal history of tuberculosis: Secondary | ICD-10-CM | POA: Diagnosis not present

## 2015-10-04 DIAGNOSIS — J449 Chronic obstructive pulmonary disease, unspecified: Secondary | ICD-10-CM | POA: Diagnosis not present

## 2015-10-04 DIAGNOSIS — M1612 Unilateral primary osteoarthritis, left hip: Secondary | ICD-10-CM | POA: Diagnosis not present

## 2015-10-04 DIAGNOSIS — K219 Gastro-esophageal reflux disease without esophagitis: Secondary | ICD-10-CM | POA: Diagnosis not present

## 2015-10-04 DIAGNOSIS — R1030 Lower abdominal pain, unspecified: Secondary | ICD-10-CM | POA: Insufficient documentation

## 2015-10-04 DIAGNOSIS — Z79899 Other long term (current) drug therapy: Secondary | ICD-10-CM | POA: Insufficient documentation

## 2015-10-04 DIAGNOSIS — R339 Retention of urine, unspecified: Secondary | ICD-10-CM | POA: Diagnosis present

## 2015-10-04 NOTE — Telephone Encounter (Signed)
Pt came into office on Monday, 10/02/16, went over completion of paperwork; pt understood, and agreed to take home, complete and return completed paperwork to office to be faxed/SLS

## 2015-10-04 NOTE — ED Notes (Signed)
Pt states that he was diagnosed with a Prostate infection on 12/14; pt states that he has been taking his antibiotics as directed; pt states that he began to have a "weak stream" of urine yesterday; pt states that he feels like he needs to strain to urinate; pt states that he feels the urge to urinate but only urinates small amounts; pt states that he also has dry mouth despite drinking fluids

## 2015-10-05 DIAGNOSIS — Z87442 Personal history of urinary calculi: Secondary | ICD-10-CM | POA: Diagnosis not present

## 2015-10-05 DIAGNOSIS — C61 Malignant neoplasm of prostate: Secondary | ICD-10-CM | POA: Diagnosis not present

## 2015-10-05 DIAGNOSIS — R1031 Right lower quadrant pain: Secondary | ICD-10-CM | POA: Diagnosis not present

## 2015-10-05 DIAGNOSIS — R300 Dysuria: Secondary | ICD-10-CM | POA: Diagnosis not present

## 2015-10-05 DIAGNOSIS — R3912 Poor urinary stream: Secondary | ICD-10-CM | POA: Diagnosis not present

## 2015-10-05 LAB — URINALYSIS, ROUTINE W REFLEX MICROSCOPIC
Bilirubin Urine: NEGATIVE
Glucose, UA: NEGATIVE mg/dL
Hgb urine dipstick: NEGATIVE
Ketones, ur: NEGATIVE mg/dL
Leukocytes, UA: NEGATIVE
Nitrite: NEGATIVE
Protein, ur: NEGATIVE mg/dL
Specific Gravity, Urine: 1.019 (ref 1.005–1.030)
pH: 5.5 (ref 5.0–8.0)

## 2015-10-05 LAB — COMPREHENSIVE METABOLIC PANEL
ALT: 35 U/L (ref 17–63)
AST: 28 U/L (ref 15–41)
Albumin: 4 g/dL (ref 3.5–5.0)
Alkaline Phosphatase: 58 U/L (ref 38–126)
Anion gap: 7 (ref 5–15)
BUN: 19 mg/dL (ref 6–20)
CO2: 24 mmol/L (ref 22–32)
Calcium: 9.5 mg/dL (ref 8.9–10.3)
Chloride: 108 mmol/L (ref 101–111)
Creatinine, Ser: 1.26 mg/dL — ABNORMAL HIGH (ref 0.61–1.24)
GFR calc Af Amer: >60 mL/min (ref >60)
GFR calc non Af Amer: 58 mL/min — ABNORMAL LOW (ref >60)
Glucose, Bld: 109 mg/dL — ABNORMAL HIGH (ref 65–99)
Potassium: 4.8 mmol/L (ref 3.5–5.1)
Sodium: 139 mmol/L (ref 135–145)
Total Bilirubin: 0.5 mg/dL (ref 0.3–1.2)
Total Protein: 7.1 g/dL (ref 6.5–8.1)

## 2015-10-05 LAB — CBC WITH DIFFERENTIAL/PLATELET
BASOS ABS: 0 10*3/uL (ref 0.0–0.1)
BASOS PCT: 1 %
Eosinophils Absolute: 0.4 10*3/uL (ref 0.0–0.7)
Eosinophils Relative: 9 %
HEMATOCRIT: 39.2 % (ref 39.0–52.0)
HEMOGLOBIN: 13.2 g/dL (ref 13.0–17.0)
LYMPHS PCT: 33 %
Lymphs Abs: 1.5 10*3/uL (ref 0.7–4.0)
MCH: 32 pg (ref 26.0–34.0)
MCHC: 33.7 g/dL (ref 30.0–36.0)
MCV: 94.9 fL (ref 78.0–100.0)
Monocytes Absolute: 0.4 10*3/uL (ref 0.1–1.0)
Monocytes Relative: 9 %
NEUTROS ABS: 2.3 10*3/uL (ref 1.7–7.7)
NEUTROS PCT: 48 %
Platelets: 290 10*3/uL (ref 150–400)
RBC: 4.13 MIL/uL — AB (ref 4.22–5.81)
RDW: 12.6 % (ref 11.5–15.5)
WBC: 4.7 10*3/uL (ref 4.0–10.5)

## 2015-10-05 LAB — LIPASE, BLOOD: Lipase: 46 U/L (ref 11–51)

## 2015-10-05 NOTE — ED Notes (Signed)
Pt requesting PO nausea medication at this time

## 2015-10-05 NOTE — Discharge Instructions (Signed)
Return here as needed.  Follow-up with your urologist.  Your testing here tonight did not show any abnormalities

## 2015-10-05 NOTE — ED Provider Notes (Signed)
Patient is stable here in the emergency department.  He is due to follow-up with his urologist tomorrow.  Told to return here as needed  Dalia Heading, PA-C 10/05/15 [place], MD 10/05/15 [phone]

## 2015-10-05 NOTE — ED Notes (Signed)
Bladder scan identifies 319 cc in bladder; pt offered urinal

## 2015-10-05 NOTE — ED Provider Notes (Signed)
CSN: [id number]     Arrival date & time 10/04/15  2328 History   None    Chief Complaint  Patient presents with  . Urinary Retention     (Consider location/radiation/quality/duration/timing/severity/associated sxs/prior Treatment) Patient is a 64 y.o. male presenting with frequency. No language interpreter was used.  Urinary Frequency This is a recurrent problem. The current episode started 1 to 4 weeks ago. The problem occurs constantly. The problem has been gradually worsening. Pertinent negatives include no abdominal pain. Nothing aggravates the symptoms. He has tried nothing for the symptoms.  Pt reports he was recently treated for prostatitis.  Pt reports he is suppose to see his urologist Dr. Jeffie Pollock tomorrow.  Pt reports he is urinating frequently with a weak stream . Pt has a history of prostate cancer. Pt reports he is also trying to stop drinking.   Past Medical History  Diagnosis Date  . Hypertension   . Prostate cancer ([place])   . GERD (gastroesophageal reflux disease)   . History of TB (tuberculosis)     1984--  hospitalized for 4 month treatment  . Post-polio syndrome     polio at age 7--PT WAS IN IRON LUNG; PT WAS IN W/C UNTIL AGE 60; STILL HAS WEAKNESS RIGHT SIDE  . History of chronic bronchitis   . History of rheumatic fever   . Bladder outlet obstruction   . Anxiety disorder   . History of urinary retention   . Nocturnal oxygen desaturation     USES O2 NIGHTLY  . Prostate cancer ([place])   . Complication of anesthesia     DIFFICULT WAKING   . Diabetes mellitus without complication ([place])     BODERLINE - DIET CONTROL  . Anxiety   . Shortness of breath dyspnea     RIGHT HEMIDIAPHRAGM ELEVATION - POST POLIO SYNDROME  . Dysrhythmia     PVC'S  . Arthritis     BILATERAL SHOULDERS, ELBOWS AND HANDS AND LEFT HIP AND KNEES--HX CORTISONE SHOTS IN SHOULDERS, ELBOWS, HIP AND KNEES  . History of oxygen administration     oxygen use 2 l/m nasally at bedtime and exertional  occasions  . COPD, frequent exacerbations ([place])     pulmologist-  dr Joya Gaskins--  [place] Rm Stage C.04-25-15 recent COPD exacerbation-much improved now, after tx. in ER [facility].  Marland Kitchen PONV (postoperative nausea and vomiting)   . Melanoma [facility])    Past Surgical History  Procedure Laterality Date  . Other surgical history       Muscle & bone Graft/Polio  . Cystoscopy w/ cystogram/  transrectal ultrasound prostate bx  03-22-2009  . Cardiovascular stress test  06-08-2014  dr Mare Ferrari    normal lexiscan study/  no ischemia/  not gated due to PAC's  . Ureterosopy stone extraction  2000  . Nasal septum surgery  2000  . Shoulder arthroscopy with open rotator cuff repair Bilateral 2013  &  1999    removal spurs and bursectomy  . Laparoscopic cholecystectomy  2013  . Prostate biopsy N/A 09/28/2014    Procedure: PROSTATE ULTRASOUND/BIOPSY;  Surgeon: [name], MD;  Location: [facility];  Service: Urology;  Laterality: N/A;  . Transurethral resection of prostate N/A 09/28/2014    Procedure: TRANSURETHRAL RESECTION OF THE PROSTATE (TURP);  Surgeon: [name], MD;  Location: [facility];  Service: Urology;  Laterality: N/A;  . Esophagogastroduodenoscopy N/A 03/22/2015    Procedure: ESOPHAGOGASTRODUODENOSCOPY (EGD) with dilation;  Surgeon: [name], MD;  Location: [facility];  Service:  Endoscopy;  Laterality: N/A;  . Savory dilation N/A 03/22/2015    Procedure: SAVORY DILATION;  Surgeon: [name], MD;  Location: [facility];  Service: Endoscopy;  Laterality: N/A;  . Colonoscopy N/A 05/03/2015    Procedure: COLONOSCOPY;  Surgeon: [name], MD;  Location: [facility];  Service: Endoscopy;  Laterality: N/A;   Family History  Problem Relation Age of Onset  . Alzheimer's disease Father 4    Deceased  . Stomach cancer Father   . Heart attack Father   . Heart disease Father   . Skin cancer Mother     Facial-Living  . Alcohol abuse Sister     x2  . Mental illness Sister     x2  . Diabetes  Maternal Aunt     x2  . Thyroid disease Maternal Aunt     x4  . Diabetes Maternal Uncle   . Tuberculosis Paternal Grandfather   . Tuberculosis Paternal Grandmother   . Alzheimer's disease Paternal Aunt   . Alzheimer's disease Paternal Uncle   . Colon cancer Neg Hx   . Colon polyps Neg Hx   . Crohn's disease Neg Hx   . Ulcerative colitis Neg Hx    Social History  Substance Use Topics  . Smoking status: Former Smoker -- 18 years    Quit date: 08/25/2013  . Smokeless tobacco: Never Used  . Alcohol Use: 7.2 oz/week    12 Cans of beer per week     Comment: average 3 beers per day    Review of Systems  Gastrointestinal: Negative for abdominal pain.  Genitourinary: Positive for frequency.      Allergies  Ivp dye; Levaquin; Penicillins; Aspirin; Morphine and related; and Nsaids  Home Medications   Prior to Admission medications   Medication Sig Start Date End Date Taking? Authorizing Provider  acetaminophen (TYLENOL) 500 MG tablet Take 1,000 mg by mouth every 6 (six) hours as needed for moderate pain or headache.   Yes Historical Provider, MD  albuterol (PROVENTIL HFA;VENTOLIN HFA) 108 (90 BASE) MCG/ACT inhaler Inhale 2 puffs into the lungs every 6 (six) hours as needed for wheezing or shortness of breath. 05/05/15  Yes Brunetta Jeans, PA-C  ALPRAZolam [name]) 1 MG tablet Take 0.5 tablets (0.5 mg total) by mouth 3 (three) times daily as needed for anxiety. 09/06/15  Yes Brunetta Jeans, PA-C  amLODipine (NORVASC) 10 MG tablet TAKE 1 TABLET BY MOUTH ONCE DAILY 03/01/15  Yes Brunetta Jeans, PA-C  BREO ELLIPTA 100-25 MCG/INH AEPB INHALE 1 PUFF INTO THE LUNGS EVERY MORNING. DAILY 08/08/15  Yes Brunetta Jeans, PA-C  calcium carbonate (TUMS - DOSED IN MG ELEMENTAL CALCIUM) 500 MG chewable tablet Chew 1 tablet by mouth 3 (three) times daily as needed for indigestion or heartburn.   Yes Historical Provider, MD  citalopram (CELEXA) 10 MG tablet Take 10 mg by mouth daily. 06/21/15  Yes  Historical Provider, MD  dexlansoprazole (DEXILANT) 60 MG capsule Take 1 capsule (60 mg total) by mouth daily. 09/28/15  Yes Brunetta Jeans, PA-C  fluticasone (FLONASE) 50 MCG/ACT nasal spray Place 2 sprays into both nostrils daily. 08/03/15  Yes Brunetta Jeans, PA-C  ipratropium-albuterol (DUONEB) 0.5-2.5 (3) MG/3ML SOLN Take 3 mLs by nebulization every 6 (six) hours as needed. 08/03/15  Yes Brunetta Jeans, PA-C  loratadine (CLARITIN) 10 MG tablet TAKE 1 TABLET BY MOUTH EVERY DAY 05/14/15  Yes Brunetta Jeans, PA-C  metoprolol succinate (TOPROL-XL) 25 MG 24 hr  tablet TAKE 1 TABLET BY MOUTH ONCE DAILY 01/26/15  Yes Brunetta Jeans, PA-C  montelukast (SINGULAIR) 10 MG tablet TAKE 1 TABLET (10 MG TOTAL) BY MOUTH AT BEDTIME. 05/23/15  Yes Elsie Stain, MD  neomycin-bacitracin-polymyxin (NEOSPORIN) ointment Apply 1 application topically as needed for wound care. apply to site of biopsy on head   Yes Historical Provider, MD  oxyCODONE (ROXICODONE) 15 MG immediate release tablet Take 1 tablet (15 mg total) by mouth every 8 (eight) hours as needed for pain. 09/06/15  Yes Brunetta Jeans, PA-C  oxymetazoline (AFRIN) 0.05 % nasal spray Place 3 sprays into both nostrils 2 (two) times daily as needed for congestion.   Yes Historical Provider, MD  predniSONE (DELTASONE) 10 MG tablet Take 10 mg by mouth daily as needed (chest tightness).   Yes Historical Provider, MD  promethazine (PHENERGAN) 25 MG tablet Take 25 mg by mouth every 6 (six) hours as needed for nausea or vomiting.   Yes Historical Provider, MD  sulfamethoxazole-trimethoprim (BACTRIM DS,SEPTRA DS) 800-160 MG tablet Take 1 tablet by mouth 2 (two) times daily. 09/28/15 10/05/15 Yes Tanna Furry, MD  tiotropium (SPIRIVA) 18 MCG inhalation capsule Place 1 capsule (18 mcg total) into inhaler and inhale daily. IN AM 05/05/15  Yes Brunetta Jeans, PA-C  cetirizine (ZYRTEC) 10 MG tablet Take 1 tablet (10 mg total) by mouth daily. 08/03/15   Brunetta Jeans, PA-C  citalopram (CELEXA) 20 MG tablet Take 1 tablet (20 mg total) by mouth daily. Patient not taking: Reported on 10/05/2015 09/06/15   Brunetta Jeans, PA-C  mirabegron ER (MYRBETRIQ) 25 MG TB24 tablet Take 1 tablet (25 mg total) by mouth daily. Patient not taking: Reported on 10/05/2015 07/26/15   Brunetta Jeans, PA-C  nitroGLYCERIN (NITROSTAT) 0.4 MG SL tablet Place 1 tablet (0.4 mg total) under the tongue every 5 (five) minutes as needed for chest pain. 06/21/15   Brunetta Jeans, PA-C  ondansetron (ZOFRAN ODT) 4 MG disintegrating tablet Take 1 tablet (4 mg total) by mouth every 8 (eight) hours as needed for nausea. Patient not taking: Reported on 10/05/2015 09/28/15   Tanna Furry, MD  OXYGEN Inhale 2 L into the lungs at bedtime.    Historical Provider, MD  predniSONE (DELTASONE) 20 MG tablet Take 20 mg by mouth daily as needed (tightness in chest).     Historical Provider, MD  tamsulosin (FLOMAX) 0.4 MG CAPS capsule Take 1 capsule (0.4 mg total) by mouth daily. 09/28/15   Tanna Furry, MD   BP 150/92 mmHg  Pulse 79  Temp(Src) 98 F (36.7 C) (Oral)  Resp 22  SpO2 99% Physical Exam  Constitutional: He is oriented to person, place, and time. He appears well-developed and well-nourished.  HENT:  Head: Normocephalic.  Eyes: EOM are normal.  Neck: Normal range of motion.  Pulmonary/Chest: Effort normal.  Abdominal: He exhibits no distension.  Tender supra pubic  Musculoskeletal: Normal range of motion.  Neurological: He is alert and oriented to person, place, and time.  Psychiatric: He has a normal mood and affect.  Nursing note and vitals reviewed.   ED Course  Procedures (including critical care time) Labs Review Labs Reviewed  CBC WITH DIFFERENTIAL/PLATELET - Abnormal; Notable for the following:    RBC 4.13 (*)    All other components within normal limits  COMPREHENSIVE METABOLIC PANEL - Abnormal; Notable for the following:    Glucose, Bld 109 (*)    Creatinine,  Ser 1.26 (*)  GFR calc non Af Amer 58 (*)    All other components within normal limits  URINE CULTURE  URINALYSIS, ROUTINE W REFLEX MICROSCOPIC (NOT AT [facility])  LIPASE, BLOOD    Imaging Review No results found. I have personally reviewed and evaluated these images and lab results as part of my medical decision-making.   EKG Interpretation None      MDM   Final diagnoses:  Dysuria    Pt's care turned over to [facility] 1:00 am labs pending    Fransico Meadow, PA-C 10/05/15 [place], PA-C 10/25/15 [place], PA-C 10/25/15 [place], [place] 11/14/15 2116  Rolland Porter, MD 11/14/15 2258

## 2015-10-06 LAB — URINE CULTURE: Culture: 1000

## 2015-10-06 NOTE — Telephone Encounter (Signed)
I left the patient a VM on 09/30/15 asking him to call me to set up a time to pick up one of our HST machines.  I have not received any response from the patient.  I have left him another VM this morning.

## 2015-10-07 ENCOUNTER — Telehealth: Payer: Self-pay | Admitting: Physician Assistant

## 2015-10-07 ENCOUNTER — Ambulatory Visit (INDEPENDENT_AMBULATORY_CARE_PROVIDER_SITE_OTHER): Payer: Medicare Other | Admitting: Physician Assistant

## 2015-10-07 ENCOUNTER — Encounter: Payer: Self-pay | Admitting: Physician Assistant

## 2015-10-07 VITALS — BP 110/68 | HR 68 | Temp 97.7°F | Ht 71.0 in | Wt 210.2 lb

## 2015-10-07 DIAGNOSIS — R682 Dry mouth, unspecified: Secondary | ICD-10-CM | POA: Diagnosis not present

## 2015-10-07 NOTE — Patient Instructions (Signed)
Please start chewing sugar-free gum to stimulate saliva production. Talk with the pharmacist about OTC medications for dry mouth -- Biotene is a good option. It comes in sprays and oral gels.  Please follow-up with Dr. Jeffie Pollock as directed. Please call Dr. Henrene Pastor at to [phone] set up an appointment.

## 2015-10-07 NOTE — Assessment & Plan Note (Signed)
Is only taking antihistamines on rare occasions. Has just started his Celexa daily but dry mouth was present before starting medication. Discussed potential of Sjogren's. Will begin Xylitol gum to stimulate saliva. Biotene also recommended. Patient to follow-up with GI regarding this and his chronic GERD with history of esophageal stricture.

## 2015-10-07 NOTE — Telephone Encounter (Signed)
Noted in chart.

## 2015-10-07 NOTE — Telephone Encounter (Signed)
Patient brought in completed forms to Office Visit/SLS 10/07/15

## 2015-10-07 NOTE — Telephone Encounter (Signed)
Caller name: Mariann Laster  Relationship to patient: Spouse   Can be reached: [phone]  Reason for call: Pt's wife called in to inform PCP that the nurse name is Webb Silversmith and that the new medication that hes started rapid flow milligram is 8.

## 2015-10-07 NOTE — Progress Notes (Signed)
Pre visit review using our clinic review tool, if applicable. No additional management support is needed unless otherwise documented below in the visit note. 

## 2015-10-07 NOTE — Progress Notes (Signed)
Patient presents to clinic today c/o of chronic dry mouth. Endorses difficulty with saliva production. Denies dry eyes. Is wondering if anxiety or medications could be contributing.  Patient recently in ER for acute prostatitis. Was given Bactrim and is taking as directed. Has followed up with Urologist (Dr. Jeffie Pollock) who has restarted Rapaflo and performed Urodynamic studies showing decreased muscle tone and contraction. Is scheduled for prostate biopsy and BNC release. Denies residual symptoms.  Past Medical History  Diagnosis Date  . Hypertension   . Prostate cancer ([place])   . GERD (gastroesophageal reflux disease)   . History of TB (tuberculosis)     1984--  hospitalized for 4 month treatment  . Post-polio syndrome     polio at age 23--PT WAS IN IRON LUNG; PT WAS IN W/C UNTIL AGE 41; STILL HAS WEAKNESS RIGHT SIDE  . History of chronic bronchitis   . History of rheumatic fever   . Bladder outlet obstruction   . Anxiety disorder   . History of urinary retention   . Nocturnal oxygen desaturation     USES O2 NIGHTLY  . Prostate cancer ([place])   . Complication of anesthesia     DIFFICULT WAKING   . Diabetes mellitus without complication ([place])     BODERLINE - DIET CONTROL  . Anxiety   . Shortness of breath dyspnea     RIGHT HEMIDIAPHRAGM ELEVATION - POST POLIO SYNDROME  . Dysrhythmia     PVC'S  . Arthritis     BILATERAL SHOULDERS, ELBOWS AND HANDS AND LEFT HIP AND KNEES--HX CORTISONE SHOTS IN SHOULDERS, ELBOWS, HIP AND KNEES  . History of oxygen administration     oxygen use 2 l/m nasally at bedtime and exertional occasions  . COPD, frequent exacerbations ([name])     pulmologist-  dr Joya Gaskins--  Girtha Rm Stage [id number].[id number] recent COPD exacerbation-much improved now, after tx. in ER [facility].  Marland Kitchen PONV (postoperative nausea and vomiting)   . Melanoma [facility])     Current Outpatient Prescriptions on File Prior to Visit  Medication Sig Dispense Refill  . acetaminophen (TYLENOL) 500 MG tablet  Take 1,000 mg by mouth every 6 (six) hours as needed for moderate pain or headache.    . albuterol (PROVENTIL HFA;VENTOLIN HFA) 108 (90 BASE) MCG/ACT inhaler Inhale 2 puffs into the lungs every 6 (six) hours as needed for wheezing or shortness of breath. 3 Inhaler 3  . ALPRAZolam (XANAX) 1 MG tablet Take 0.5 tablets (0.5 mg total) by mouth 3 (three) times daily as needed for anxiety. 90 tablet 0  . amLODipine (NORVASC) 10 MG tablet TAKE 1 TABLET BY MOUTH ONCE DAILY 90 tablet 1  . BREO ELLIPTA 100-25 MCG/INH AEPB INHALE 1 PUFF INTO THE LUNGS EVERY MORNING. DAILY 60 each 5  . calcium carbonate (TUMS - DOSED IN MG ELEMENTAL CALCIUM) 500 MG chewable tablet Chew 1 tablet by mouth 3 (three) times daily as needed for indigestion or heartburn.    . citalopram (CELEXA) 10 MG tablet Take 10 mg by mouth daily.    Marland Kitchen dexlansoprazole (DEXILANT) 60 MG capsule Take 1 capsule (60 mg total) by mouth daily. 90 capsule 1  . fluticasone (FLONASE) 50 MCG/ACT nasal spray Place 2 sprays into both nostrils daily. 16 g 6  . ipratropium-albuterol (DUONEB) 0.5-2.5 (3) MG/3ML SOLN Take 3 mLs by nebulization every 6 (six) hours as needed. 360 mL 6  . metoprolol succinate (TOPROL-XL) 25 MG 24 hr tablet TAKE 1 TABLET BY MOUTH ONCE DAILY 30 tablet  3  . montelukast (SINGULAIR) 10 MG tablet TAKE 1 TABLET (10 MG TOTAL) BY MOUTH AT BEDTIME. 30 tablet 2  . neomycin-bacitracin-polymyxin (NEOSPORIN) ointment Apply 1 application topically as needed for wound care. apply to site of biopsy on head    . nitroGLYCERIN (NITROSTAT) 0.4 MG SL tablet Place 1 tablet (0.4 mg total) under the tongue every 5 (five) minutes as needed for chest pain. 30 tablet 3  . ondansetron (ZOFRAN ODT) 4 MG disintegrating tablet Take 1 tablet (4 mg total) by mouth every 8 (eight) hours as needed for nausea. 20 tablet 0  . oxyCODONE (ROXICODONE) 15 MG immediate release tablet Take 1 tablet (15 mg total) by mouth every 8 (eight) hours as needed for pain. 90 tablet 0  .  OXYGEN Inhale 2 L into the lungs at bedtime.    Marland Kitchen oxymetazoline (AFRIN) 0.05 % nasal spray Place 3 sprays into both nostrils 2 (two) times daily as needed for congestion.    . predniSONE (DELTASONE) 10 MG tablet Take 10 mg by mouth daily as needed (chest tightness).    . promethazine (PHENERGAN) 25 MG tablet Take 25 mg by mouth every 6 (six) hours as needed for nausea or vomiting.    . tamsulosin (FLOMAX) 0.4 MG CAPS capsule Take 1 capsule (0.4 mg total) by mouth daily. 14 capsule 0  . tiotropium (SPIRIVA) 18 MCG inhalation capsule Place 1 capsule (18 mcg total) into inhaler and inhale daily. IN AM 90 capsule 1  . citalopram (CELEXA) 20 MG tablet Take 1 tablet (20 mg total) by mouth daily. (Patient not taking: Reported on 10/05/2015) 30 tablet 3   No current facility-administered medications on file prior to visit.    Allergies  Allergen Reactions  . Ivp Dye [Iodinated Diagnostic Agents] Anaphylaxis  . Levaquin [Levofloxacin In D5w] Shortness Of Breath    In addition: sweating, chest pain, and diarrhea.   . Penicillins Anaphylaxis    Heart stops Has patient had a PCN reaction causing immediate rash, facial/tongue/throat swelling, SOB or lightheadedness with hypotension: yes Has patient had a PCN reaction causing severe rash involving mucus membranes or skin necrosis: yes Has patient had a PCN reaction that required hospitalization yes Has patient had a PCN reaction occurring within the last 10 years: No If all of the above answers are "NO", then may proceed with Cephalosporin use.   . Aspirin Other (See Comments)    Reaction unknown  . Morphine And Related Nausea And Vomiting    Can take with zofran   . Nsaids Other (See Comments)    Difficulty breathing    Family History  Problem Relation Age of Onset  . Alzheimer's disease Father 4    Deceased  . Stomach cancer Father   . Heart attack Father   . Heart disease Father   . Skin cancer Mother     Facial-Living  . Alcohol abuse  Sister     x2  . Mental illness Sister     x2  . Diabetes Maternal Aunt     x2  . Thyroid disease Maternal Aunt     x4  . Diabetes Maternal Uncle   . Tuberculosis Paternal Grandfather   . Tuberculosis Paternal Grandmother   . Alzheimer's disease Paternal Aunt   . Alzheimer's disease Paternal Uncle   . Colon cancer Neg Hx   . Colon polyps Neg Hx   . Crohn's disease Neg Hx   . Ulcerative colitis Neg Hx     Social History  Social History  . Marital Status: Married    Spouse Name: N/A  . Number of Children: N/A  . Years of Education: N/A   Occupational History  . Retired    Social History Main Topics  . Smoking status: Former Smoker -- 82 years    Quit date: 08/25/2013  . Smokeless tobacco: Never Used  . Alcohol Use: 7.2 oz/week    12 Cans of beer per week     Comment: average 3 beers per day  . Drug Use: No  . Sexual Activity: Not Asked   Other Topics Concern  . None   Social History Narrative    Review of Systems - See HPI.  All other ROS are negative.  BP 110/68 mmHg  Pulse 68  Temp(Src) 97.7 F (36.5 C) (Oral)  Ht '5\' 11"'  (1.803 m)  Wt 210 lb 3.2 oz (95.346 kg)  BMI 29.33 kg/m2  SpO2 98%  Physical Exam  Constitutional: He is oriented to person, place, and time and well-developed, well-nourished, and in no distress.  HENT:  Head: Normocephalic and atraumatic.  Mouth/Throat: Uvula is midline, oropharynx is clear and moist and mucous membranes are normal.  Eyes: Conjunctivae and EOM are normal. Pupils are equal, round, and reactive to light.  Cardiovascular: Normal rate, regular rhythm, normal heart sounds and intact distal pulses.   Pulmonary/Chest: Effort normal and breath sounds normal.  Neurological: He is alert and oriented to person, place, and time.  Skin: Skin is warm and dry.  Vitals reviewed.   Recent Results (from the past 2160 hour(s))  Hepatitis C Antibody     Status: None   Collection Time: 08/12/15 11:34 AM  Result Value Ref Range     HCV Ab NEGATIVE NEGATIVE  CBC     Status: None   Collection Time: 09/02/15  5:30 PM  Result Value Ref Range   WBC 6.2 4.0 - 10.5 K/uL   RBC 4.80 4.22 - 5.81 MIL/uL   Hemoglobin 15.4 13.0 - 17.0 g/dL   HCT 45.1 39.0 - 52.0 %   MCV 94.0 78.0 - 100.0 fL   MCH 32.1 26.0 - 34.0 pg   MCHC 34.1 30.0 - 36.0 g/dL   RDW 12.8 11.5 - 15.5 %   Platelets 259 150 - 400 K/uL  Basic metabolic panel     Status: None   Collection Time: 09/02/15  5:30 PM  Result Value Ref Range   Sodium 135 135 - 145 mmol/L   Potassium 3.9 3.5 - 5.1 mmol/L   Chloride 102 101 - 111 mmol/L   CO2 24 22 - 32 mmol/L   Glucose, Bld 92 65 - 99 mg/dL   BUN 19 6 - 20 mg/dL   Creatinine, Ser 1.22 0.61 - 1.24 mg/dL   Calcium 9.7 8.9 - 10.3 mg/dL   GFR calc non Af Amer >60 >60 mL/min   GFR calc Af Amer >60 >60 mL/min    Comment: (NOTE) The eGFR has been calculated using the CKD EPI equation. This calculation has not been validated in all clinical situations. eGFR's persistently <60 mL/min signify possible Chronic Kidney Disease.    Anion gap 9 5 - 15  Troponin I     Status: None   Collection Time: 09/02/15  5:30 PM  Result Value Ref Range   Troponin I <0.03 <0.031 ng/mL    Comment:        NO INDICATION OF MYOCARDIAL INJURY.   5 HIAA, quantitative, Urine, 24 hour  Status: None   Collection Time: 09/06/15  9:47 AM  Result Value Ref Range   5-HIAA, 24 Hr Urine 1.2 <=6.0 mg/24 h  Catecholamines, fractionated, urine, 24 hour     Status: None   Collection Time: 09/06/15  9:47 AM  Result Value Ref Range   Total Volume - CF 24Hr U 1200 mL   Epinephrine, 24 hr Urine 3 2 - 24 mcg/24 h   Norepinephrine, 24 hr Ur 26 15 - 100 mcg/24 h   Calculated Total (E+NE) 29 26 - 121 mcg/24 h   Dopamine, 24 hr Urine 141 52 - 480 mcg/24 h   Creatinine, Urine mg/day-CATEUR 1.39 0.63 - 2.50 g/24 h  CBC     Status: None   Collection Time: 09/06/15  3:46 PM  Result Value Ref Range   WBC 5.5 4.0 - 10.5 K/uL   RBC 4.61 4.22 - 5.81  Mil/uL   Platelets 240.0 150.0 - 400.0 K/uL   Hemoglobin 14.7 13.0 - 17.0 g/dL   HCT 43.7 39.0 - 52.0 %   MCV 94.8 78.0 - 100.0 fl   MCHC 33.6 30.0 - 36.0 g/dL   RDW 13.3 11.5 - 15.5 %  Comp Met (CMET)     Status: Abnormal   Collection Time: 09/06/15  3:46 PM  Result Value Ref Range   Sodium 138 135 - 145 mEq/L   Potassium 4.1 3.5 - 5.1 mEq/L   Chloride 102 96 - 112 mEq/L   CO2 27 19 - 32 mEq/L   Glucose, Bld 114 (H) 70 - 99 mg/dL   BUN 14 6 - 23 mg/dL   Creatinine, Ser 1.37 0.40 - 1.50 mg/dL   Total Bilirubin 0.5 0.2 - 1.2 mg/dL   Alkaline Phosphatase 60 39 - 117 U/L   AST 28 0 - 37 U/L   ALT 40 0 - 53 U/L   Total Protein 7.3 6.0 - 8.3 g/dL   Albumin 4.2 3.5 - 5.2 g/dL   Calcium 10.0 8.4 - 10.5 mg/dL   GFR 55.29 (L) >60.00 mL/min  CBC with Differential/Platelet     Status: None   Collection Time: 09/28/15  7:56 AM  Result Value Ref Range   WBC 7.7 4.0 - 10.5 K/uL   RBC 4.44 4.22 - 5.81 MIL/uL   Hemoglobin 14.0 13.0 - 17.0 g/dL   HCT 42.1 39.0 - 52.0 %   MCV 94.8 78.0 - 100.0 fL   MCH 31.5 26.0 - 34.0 pg   MCHC 33.3 30.0 - 36.0 g/dL   RDW 12.6 11.5 - 15.5 %   Platelets 309 150 - 400 K/uL   Neutrophils Relative % 76 %   Neutro Abs 5.8 1.7 - 7.7 K/uL   Lymphocytes Relative 12 %   Lymphs Abs 0.9 0.7 - 4.0 K/uL   Monocytes Relative 9 %   Monocytes Absolute 0.7 0.1 - 1.0 K/uL   Eosinophils Relative 3 %   Eosinophils Absolute 0.2 0.0 - 0.7 K/uL   Basophils Relative 0 %   Basophils Absolute 0.0 0.0 - 0.1 K/uL  Basic metabolic panel     Status: Abnormal   Collection Time: 09/28/15  7:56 AM  Result Value Ref Range   Sodium 134 (L) 135 - 145 mmol/L   Potassium 4.2 3.5 - 5.1 mmol/L   Chloride 101 101 - 111 mmol/L   CO2 26 22 - 32 mmol/L   Glucose, Bld 147 (H) 65 - 99 mg/dL   BUN 10 6 - 20 mg/dL   Creatinine,  Ser 1.10 0.61 - 1.24 mg/dL   Calcium 8.9 8.9 - 10.3 mg/dL   GFR calc non Af Amer >60 >60 mL/min   GFR calc Af Amer >60 >60 mL/min    Comment: (NOTE) The eGFR has  been calculated using the CKD EPI equation. This calculation has not been validated in all clinical situations. eGFR's persistently <60 mL/min signify possible Chronic Kidney Disease.    Anion gap 7 5 - 15  I-Stat CG4 Lactic Acid, ED     Status: None   Collection Time: 09/28/15  8:10 AM  Result Value Ref Range   Lactic Acid, Venous 1.49 0.5 - 2.0 mmol/L  Urinalysis, Routine w reflex microscopic (not at [facility])     Status: Abnormal   Collection Time: 09/28/15  9:02 AM  Result Value Ref Range   Color, Urine YELLOW YELLOW   APPearance CLOUDY (A) CLEAR   Specific Gravity, Urine 1.013 1.005 - 1.030   pH 5.5 5.0 - 8.0   Glucose, UA NEGATIVE NEGATIVE mg/dL   Hgb urine dipstick NEGATIVE NEGATIVE   Bilirubin Urine NEGATIVE NEGATIVE   Ketones, ur NEGATIVE NEGATIVE mg/dL   Protein, ur NEGATIVE NEGATIVE mg/dL   Nitrite NEGATIVE NEGATIVE   Leukocytes, UA NEGATIVE NEGATIVE    Comment: MICROSCOPIC NOT DONE ON URINES WITH NEGATIVE PROTEIN, BLOOD, LEUKOCYTES, NITRITE, OR GLUCOSE <1000 mg/dL.  Urine culture     Status: None   Collection Time: 09/28/15  9:02 AM  Result Value Ref Range   Specimen Description URINE, CLEAN CATCH    Special Requests NONE    Culture      10,000 COLONIES/mL STAPHYLOCOCCUS SPECIES (COAGULASE NEGATIVE) Performed at [facility]    Report Status 09/30/2015 FINAL    Organism ID, Bacteria STAPHYLOCOCCUS SPECIES (COAGULASE NEGATIVE)       Susceptibility   Staphylococcus species (coagulase negative) - MIC*    CIPROFLOXACIN <=0.5 SENSITIVE Sensitive     GENTAMICIN <=0.5 SENSITIVE Sensitive     NITROFURANTOIN <=16 SENSITIVE Sensitive     OXACILLIN >=4 RESISTANT Resistant     TETRACYCLINE <=1 SENSITIVE Sensitive     VANCOMYCIN <=0.5 SENSITIVE Sensitive     TRIMETH/SULFA <=10 SENSITIVE Sensitive     CLINDAMYCIN <=0.25 SENSITIVE Sensitive     RIFAMPIN <=0.5 SENSITIVE Sensitive     Inducible Clindamycin NEGATIVE Sensitive     * 10,000 COLONIES/mL STAPHYLOCOCCUS  SPECIES (COAGULASE NEGATIVE)  CBC with Differential/Platelet     Status: Abnormal   Collection Time: 10/05/15  1:04 AM  Result Value Ref Range   WBC 4.7 4.0 - 10.5 K/uL   RBC 4.13 (L) 4.22 - 5.81 MIL/uL   Hemoglobin 13.2 13.0 - 17.0 g/dL   HCT 39.2 39.0 - 52.0 %   MCV 94.9 78.0 - 100.0 fL   MCH 32.0 26.0 - 34.0 pg   MCHC 33.7 30.0 - 36.0 g/dL   RDW 12.6 11.5 - 15.5 %   Platelets 290 150 - 400 K/uL   Neutrophils Relative % 48 %   Neutro Abs 2.3 1.7 - 7.7 K/uL   Lymphocytes Relative 33 %   Lymphs Abs 1.5 0.7 - 4.0 K/uL   Monocytes Relative 9 %   Monocytes Absolute 0.4 0.1 - 1.0 K/uL   Eosinophils Relative 9 %   Eosinophils Absolute 0.4 0.0 - 0.7 K/uL   Basophils Relative 1 %   Basophils Absolute 0.0 0.0 - 0.1 K/uL  Comprehensive metabolic panel     Status: Abnormal   Collection Time: 10/05/15  1:04 AM  Result Value Ref Range   Sodium 139 135 - 145 mmol/L   Potassium 4.8 3.5 - 5.1 mmol/L   Chloride 108 101 - 111 mmol/L   CO2 24 22 - 32 mmol/L   Glucose, Bld 109 (H) 65 - 99 mg/dL   BUN 19 6 - 20 mg/dL   Creatinine, Ser 1.26 (H) 0.61 - 1.24 mg/dL   Calcium 9.5 8.9 - 10.3 mg/dL   Total Protein 7.1 6.5 - 8.1 g/dL   Albumin 4.0 3.5 - 5.0 g/dL   AST 28 15 - 41 U/L   ALT 35 17 - 63 U/L   Alkaline Phosphatase 58 38 - 126 U/L   Total Bilirubin 0.5 0.3 - 1.2 mg/dL   GFR calc non Af Amer 58 (L) >60 mL/min   GFR calc Af Amer >60 >60 mL/min    Comment: (NOTE) The eGFR has been calculated using the CKD EPI equation. This calculation has not been validated in all clinical situations. eGFR's persistently <60 mL/min signify possible Chronic Kidney Disease.    Anion gap 7 5 - 15  Lipase, blood     Status: None   Collection Time: 10/05/15  1:04 AM  Result Value Ref Range   Lipase 46 11 - 51 U/L  Urine culture     Status: None   Collection Time: 10/05/15  1:55 AM  Result Value Ref Range   Specimen Description URINE, CLEAN CATCH    Special Requests NONE    Culture      1,000  COLONIES/mL INSIGNIFICANT GROWTH Performed at [facility]    Report Status 10/06/2015 FINAL   Urinalysis, Routine w reflex microscopic (not at [facility])     Status: None   Collection Time: 10/05/15  1:55 AM  Result Value Ref Range   Color, Urine YELLOW YELLOW   APPearance CLEAR CLEAR   Specific Gravity, Urine 1.019 1.005 - 1.030   pH 5.5 5.0 - 8.0   Glucose, UA NEGATIVE NEGATIVE mg/dL   Hgb urine dipstick NEGATIVE NEGATIVE   Bilirubin Urine NEGATIVE NEGATIVE   Ketones, ur NEGATIVE NEGATIVE mg/dL   Protein, ur NEGATIVE NEGATIVE mg/dL   Nitrite NEGATIVE NEGATIVE   Leukocytes, UA NEGATIVE NEGATIVE    Comment: MICROSCOPIC NOT DONE ON URINES WITH NEGATIVE PROTEIN, BLOOD, LEUKOCYTES, NITRITE, OR GLUCOSE <1000 mg/dL.    Assessment/Plan: Dry mouth Is only taking antihistamines on rare occasions. Has just started his Celexa daily but dry mouth was present before starting medication. Discussed potential of Sjogren's. Will begin Xylitol gum to stimulate saliva. Biotene also recommended. Patient to follow-up with GI regarding this and his chronic GERD with history of esophageal stricture.

## 2015-10-12 ENCOUNTER — Other Ambulatory Visit: Payer: Self-pay | Admitting: Urology

## 2015-10-13 ENCOUNTER — Other Ambulatory Visit: Payer: Self-pay | Admitting: Urology

## 2015-10-14 ENCOUNTER — Encounter (HOSPITAL_COMMUNITY): Payer: Self-pay | Admitting: *Deleted

## 2015-10-18 ENCOUNTER — Other Ambulatory Visit: Payer: Self-pay | Admitting: Physician Assistant

## 2015-10-18 MED ORDER — AMLODIPINE BESYLATE 10 MG PO TABS: 10.0000 mg | ORAL_TABLET | Freq: Every day | ORAL | Status: DC

## 2015-10-18 NOTE — Telephone Encounter (Signed)
Caller name: Self   Can be reached: [phone]  Pharmacy: [facility]  Address: [address], [place], [place] [address]  Phone: [phone]   Reason for call: Refill on amLODipine (NORVASC) 10 MG tablet [id number]

## 2015-10-18 NOTE — Telephone Encounter (Signed)
Refill sent.

## 2015-10-19 ENCOUNTER — Telehealth: Payer: Self-pay | Admitting: Physician Assistant

## 2015-10-19 MED ORDER — AMLODIPINE BESYLATE 10 MG PO TABS: 10.0000 mg | ORAL_TABLET | Freq: Every day | ORAL | Status: DC

## 2015-10-19 NOTE — Telephone Encounter (Signed)
Pt was informed the below and understood. °

## 2015-10-19 NOTE — Telephone Encounter (Signed)
Caller name: Izaiah Relation to pt: Self  Call back number: [phone] Pharmacy: [facility] in [place]  Reason for call: Pt came in office stating  needing rx for amLODipine (NORVASC) 10 MG tablet, explained to pt rx was sent to Pharmacy at [facility]. Pt mention changed pharmacy and is needing it to be sent to [facility] at [facility] and would like to have it changed on is chart to have all his meds sent to [facility]. Pt state is having surgery tomorrow (biopsy) and is needing meds today. Please advise.

## 2015-10-19 NOTE — Telephone Encounter (Signed)
Medication sent.  Please inform patient

## 2015-10-20 ENCOUNTER — Telehealth: Payer: Self-pay | Admitting: Physician Assistant

## 2015-10-20 ENCOUNTER — Encounter: Payer: Self-pay | Admitting: Pulmonary Disease

## 2015-10-20 ENCOUNTER — Ambulatory Visit (INDEPENDENT_AMBULATORY_CARE_PROVIDER_SITE_OTHER): Payer: Medicare Other | Admitting: Pulmonary Disease

## 2015-10-20 VITALS — BP 138/82 | HR 76 | Ht 61.0 in | Wt 211.0 lb

## 2015-10-20 DIAGNOSIS — G4733 Obstructive sleep apnea (adult) (pediatric): Secondary | ICD-10-CM | POA: Diagnosis not present

## 2015-10-20 DIAGNOSIS — J449 Chronic obstructive pulmonary disease, unspecified: Secondary | ICD-10-CM | POA: Diagnosis not present

## 2015-10-20 MED ORDER — MONTELUKAST SODIUM 10 MG PO TABS: ORAL_TABLET | ORAL | Status: DC

## 2015-10-20 MED ORDER — METOPROLOL SUCCINATE ER 25 MG PO TB24: 25.0000 mg | ORAL_TABLET | Freq: Every day | ORAL | Status: DC

## 2015-10-20 MED ORDER — ALBUTEROL SULFATE HFA 108 (90 BASE) MCG/ACT IN AERS: 2.0000 | INHALATION_SPRAY | Freq: Four times a day (QID) | RESPIRATORY_TRACT | Status: DC | PRN

## 2015-10-20 MED ORDER — FLUTICASONE FUROATE-VILANTEROL 100-25 MCG/INH IN AEPB: INHALATION_SPRAY | RESPIRATORY_TRACT | Status: DC

## 2015-10-20 MED ORDER — CITALOPRAM HYDROBROMIDE 20 MG PO TABS: 20.0000 mg | ORAL_TABLET | Freq: Every day | ORAL | Status: DC

## 2015-10-20 NOTE — Progress Notes (Signed)
   Subjective:    Patient ID: [name], male    DOB: [birth year], 65 y.o.   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 Follow-up    Pt is here for 3 month follow up. Pt c/o chest congestion, prod cough with clear mucus in the mornings, SOB and wheezing at times. Sinus congestion due to allergies. Denies any fever, nausea or vomiting.   53m FU Self adm [prednisone prn - last 3 wks ago'  Last bronchitis 3 y ago Prostate biopsy planned next week  - has prostate CA HST was ordered - but not done yet  Says he taking BREO and Spiriva on reg basis .  Previously went to pulmonary rehab. But had to stop, would like to restart.  Review of Systems neg for any significant sore throat, dysphagia, itching, sneezing, nasal congestion or excess/ purulent secretions, fever, chills, sweats, unintended wt loss, pleuritic or exertional cp, hempoptysis, orthopnea pnd or change in chronic leg swelling. Also denies presyncope, palpitations, heartburn, abdominal pain, nausea, vomiting, diarrhea or change in bowel or urinary habits, dysuria,hematuria, rash, arthralgias, visual complaints, headache, numbness weakness or ataxia.     Objective:   Physical Exam  Gen. Pleasant, well-nourished, in no distress ENT - no lesions, no post nasal drip Neck: No JVD, no thyromegaly, no carotid bruits Lungs: no use of accessory muscles, no dullness to percussion, clear without rales or rhonchi  Cardiovascular: Rhythm regular, heart sounds  normal, no murmurs or gallops, no peripheral edema Musculoskeletal: No deformities, no cyanosis or clubbing          Assessment & Plan:

## 2015-10-20 NOTE — Patient Instructions (Addendum)
Cleared for prostate biopsy Proceed with home sleep stest Trial off nocturnal oxygen

## 2015-10-20 NOTE — Telephone Encounter (Signed)
Refills of chronic, non-controlled substances sent to pharmacy. Pain mediation and Xanax can only be one month supplies at a time.

## 2015-10-20 NOTE — Telephone Encounter (Signed)
Caller name: [name]  Relationship to patient: Self  Can be reached: [phone]  Pharmacy: [facility] [address], [place], [place] [address]  Reason for call: pt says that per his insurance his medication refills has to be refilled in 3 month supply. I asked pt which medications did he need a refill on, pt responded all of them.   Please assist further.

## 2015-10-21 NOTE — Assessment & Plan Note (Signed)
Continue bradycardia on Spiriva He is cleared for prostate surgery He can resume pulmonary rehabilitation when he is ready We will assess nocturnal oxygenation on home sleep study- if he does not have significant desats he can stay off oxygen

## 2015-10-21 NOTE — Assessment & Plan Note (Signed)
Sleep study is planned

## 2015-10-24 ENCOUNTER — Telehealth: Payer: Self-pay | Admitting: Physician Assistant

## 2015-10-24 MED ORDER — GENTAMICIN SULFATE 40 MG/ML IJ SOLN
5.0000 mg/kg | INTRAMUSCULAR | Status: AC
  Administered 2015-10-25: 350 mg via INTRAVENOUS
  Filled 2015-10-24: qty 8.75

## 2015-10-24 MED ORDER — DEXTROSE 5 % IV SOLN: 2.0000 g | Freq: Once | INTRAVENOUS | Status: DC

## 2015-10-24 MED ORDER — DEXTROSE 5 % IV SOLN
2.0000 g | Freq: Once | INTRAVENOUS | Status: DC
  Filled 2015-10-24: qty 2

## 2015-10-24 NOTE — Telephone Encounter (Signed)
Rx was sent on 10/20/15 and 2-month supply requested. Can you call pharmacy to phone in a 32-month supply of medication?

## 2015-10-24 NOTE — Anesthesia Preprocedure Evaluation (Addendum)
Anesthesia Evaluation  Patient identified by MRN, date of birth, ID band Patient awake    Reviewed: Allergy & Precautions, NPO status , Patient's Chart, lab work & pertinent test results, reviewed documented beta blocker date and time   History of Anesthesia Complications (+) PONV and history of anesthetic complications  Airway Mallampati: III  TM Distance: >3 FB Neck ROM: Full    Dental  (+) Partial Lower, Poor Dentition, Chipped, Missing, Partial Upper,    Pulmonary sleep apnea , COPD,  oxygen dependent, former smoker,     + wheezing      Cardiovascular hypertension, Pt. on medications and Pt. on home beta blockers + dysrhythmias  Rhythm:Regular Rate:Tachycardia     Neuro/Psych PSYCHIATRIC DISORDERS Anxiety Bipolar Disorder  Neuromuscular disease    GI/Hepatic Neg liver ROS, GERD  ,  Endo/Other  diabetes, Well Controlled  Renal/GU Renal disease  negative genitourinary   Musculoskeletal  (+) Arthritis , Osteoarthritis,    Abdominal   Peds negative pediatric ROS (+)  Hematology negative hematology ROS (+)   Anesthesia Other Findings   Reproductive/Obstetrics negative OB ROS                           08/2015 EKG: normal sinus rhythm, RBBB.   Anesthesia Physical Anesthesia Plan  ASA: III  Anesthesia Plan: General   Post-op Pain Management:    Induction: Intravenous  Airway Management Planned: Oral ETT and LMA  Additional Equipment:   Intra-op Plan:   Post-operative Plan: Extubation in OR  Informed Consent: I have reviewed the patients History and Physical, chart, labs and discussed the procedure including the risks, benefits and alternatives for the proposed anesthesia with the patient or authorized representative who has indicated his/her understanding and acceptance.   Dental advisory given  Plan Discussed with: CRNA  Anesthesia Plan Comments: (LMA if GERD well  controlled. )       Anesthesia Quick Evaluation

## 2015-10-24 NOTE — H&P (Signed)
Active Problems Problems  1. Abdominal distress in right lower quadrant (R10.31) 2. Abdominal pain, RLQ (right lower quadrant) (R10.31) 3. Benign prostatic hyperplasia with urinary obstruction (N40.1,N13.8) 4. Erectile dysfunction due to arterial insufficiency (N52.01) 5. Hypogonadism, testicular (E29.1) 6. Klinefelter syndrome (Q98.4) 7. Nocturia (R35.1) 8. Prostate cancer (C61) 9. Pyuria (N39.0) 10. Right flank pain (R10.9) 11. Testicular atrophy (N50.0) 12. Undescended testicle (Q53.9) 13. History of Urinary retention (R33.9) 14. Weak urinary stream (R39.12)  History of Present Illness [name] returns today in f/u. He had a TURP on 12/14 and is voiding well. He was found to have a T1a prostate cancer on the TURP chips but the TRUS biopsy was negative. His PSA was 2.85 in June and 4.05 in October but it was 7.43 prior to his procedure.  He was seen on 12/14 by his PCP. He had severe pain in the right flank and had chills about a week before and had nausea with voiding. He had a UA and was told he had prostatitis. He was given IV fluids and was started on Sulfa.  He went to the ER last night and was told he was dehydrated and maybe in retention.  His UA was negative a culture was pending. He had a mild elevation in his Cr but chemistries were o/w ok. He has a weak stream in the morning but it is better in the afternoon. He has some intermittency. He has nocturia x 4. He has had no hematuria.   Past Medical History Problems  1. History of Anxiety (F41.9) 2. History of Arthritis 3. History of Asthma (J45.909) 4. History of Chronic obstructive pulmonary disease (J44.9) 5. History of Gout 6. History of Gross hematuria (R31.0) 7. History of depression (Z86.59) 8. History of esophageal reflux (Z87.19) 9. History of hypertension (Z86.79) 10. History of kidney stones (Z87.442) 11. History of post-polio syndrome (Z86.12) 12. History of renal calculi (Z87.442) 13. History of Hypogonadism,  testicular (E29.1) 14. History of Klinefelters syndrome (Q98.4) 15. History of Tuberculosis 16. History of Urinary retention (R33.9) 17. Weak urinary stream (R39.12)  Surgical History Problems  1. History of Diagnostic Cystoscopy 2. History of Leg Repair 3. History of Lithotomy 4. History of Needle Biopsy Of Prostate 5. History of Needle Biopsy Of Prostate 6. History of Therapeutic Cystoscopy 7. History of Transurethral Resection Of Prostate (TURP)  Current Meds 1. Acetaminophen 500 MG Oral Capsule;  Therapy: (Recorded:21Dec2016) to Recorded 2. Albuterol 90 MCG/ACT AERS;  Therapy: (Recorded:17May2010) to Recorded 3. ALPRAZolam 1 MG Oral Tablet;  Therapy: (Recorded:29Dec2015) to Recorded 4. AmLODIPine Besylate TABS;  Therapy: (Recorded:15Oct2015) to Recorded 5. Baclofen 10 MG Oral Tablet;  Therapy: (Recorded:29Dec2015) to Recorded 6. Bactrim DS 800-160 MG Oral Tablet;  Therapy: (Recorded:21Dec2016) to Recorded 7. Breo Ellipta 100-25 MCG/INH Inhalation Aerosol Powder Breath Activated;  Therapy: (Recorded:21Dec2016) to Recorded 8. Calcium Carbonate 500 MG CHEW;  Therapy: (Recorded:21Dec2016) to Recorded 9. Cetirizine HCl - 10 MG Oral Tablet;  Therapy: (Recorded:21Dec2016) to Recorded 10. Citalopram Hydrobromide 20 MG Oral Tablet;   Therapy: (Recorded:21Dec2016) to Recorded 11. Dexilant 60 MG Oral Capsule Delayed Release;   Therapy: (Recorded:21Dec2016) to Recorded 12. Flonase 50 MCG/ACT SUSP;   Therapy: (Recorded:29Dec2015) to Recorded 13. Loratadine 10 MG Oral Tablet;   Therapy: (Recorded:29Dec2015) to Recorded 14. Metoprolol Tartrate TABS;   Therapy: (Recorded:23Dec2015) to Recorded 15. Nitroglycerin 0.4 MG Sublingual Tablet Sublingual;   Therapy: (Recorded:17May2010) to Recorded 16. Omeprazole 20 MG Oral Capsule Delayed Release;   Therapy: (Recorded:29Dec2015) to Recorded 17. OxyCODONE HCl ER 15 MG Oral  Tablet ER 12 Hour Abuse-Deterrent;   Therapy:  (Recorded:21Dec2016) to Recorded 18. Oxygen Use;   Therapy: (Recorded:15Oct2015) to Recorded 19. Pepcid TABS;   Therapy: (Recorded:15Oct2015) to Recorded 20. PredniSONE 10 MG Oral Tablet;   Therapy: (Recorded:21Dec2016) to Recorded 21. ProAir HFA AERS;   Therapy: (Recorded:15Oct2015) to Recorded 22. Promethazine HCl - 12.5 MG Oral Tablet;   Therapy: (Recorded:29Dec2015) to Recorded 23. Singulair 10 MG Oral Tablet;   Therapy: (Recorded:17May2010) to Recorded 24. Spiriva HandiHaler CAPS;   Therapy: (Recorded:21Dec2016) to Recorded 25. Zofran TABS;   Therapy: (Recorded:21Dec2016) to Recorded  Allergies Medication  1. Bactrim TABS 2. LevoFLOXacin TABS 3. Penicillins 4. Aspirin TABS 5. Morphine Derivatives 6. NSAIDs Non-Medication  7. Contrast Dye  Family History Problems  1. Family history of Alzheimer Disease : Father 2. Family history of Death In The Family Father : Father   Alzheimer'sAge 88 3. Family history of Family Health Status - Mother's Age : Mother   age 40 4. Family history of Skin Cancer : Mother  Social History Problems  1. Alcohol Use (History)   6-12 daily. 2. Denied: History of Caffeine Use 3. Former smoker [phone]) 4. Former tobacco use (Z87.891) 5. Married 6. Occupation:   Maintenance 7. Tobacco Use   Smokes Pipe TobaccoStopped Cigarette Smoking 45 yrs ago  Past, family and social history reviewed and updated.   Review of Systems Genitourinary, constitutional, skin, eye, otolaryngeal, hematologic/lymphatic, cardiovascular, pulmonary, endocrine, musculoskeletal, gastrointestinal, neurological and psychiatric system(s) were reviewed and pertinent findings if present are noted and are otherwise negative.  Genitourinary: nocturia, weak urinary stream and urinary stream starts and stops.  The patient presents with complaints of flank pain (when he voids. ).  Constitutional: chills, but no fever.    Vitals Vital Signs [Data Includes: Last 1  Day]  Recorded: 21Dec2016 08:58AM  Blood Pressure: 134 / 86 Temperature: 97.6 F Heart Rate: 64  Physical Exam Constitutional: Well nourished and well developed . No acute distress.  Pulmonary: No respiratory distress and normal respiratory rhythm and effort.  Cardiovascular: Heart rate and rhythm are normal . No peripheral edema.  Abdomen: The abdomen is soft and nontender. No CVA tenderness.    Results/Data Urine [Data Includes: Last 1 Day]   [id number]  COLOR YELLOW   APPEARANCE CLEAR   SPECIFIC GRAVITY 1.025   pH 5.5   GLUCOSE NEGATIVE   BILIRUBIN NEGATIVE   KETONE NEGATIVE   BLOOD NEGATIVE   PROTEIN NEGATIVE   NITRITE NEGATIVE   LEUKOCYTE ESTERASE NEGATIVE    Old records or history reviewed: ER notes reviewed.  The following images/tracing/specimen were independently visualized:  CT today shows no stones or hydro. His bladder appears full with a stable small diverticulum at the left base and a TUR defect.  The following clinical lab reports were reviewed:  ER labs and UA reviewed.  Flow Rate: Voided 101 ml. A peak flow rate of 34ml/s and mean flow rate of 16ml/s . His flowrate is very intermittent.  PVR: Ultrasound PVR 267 ml. PSA Flowsheet [Data Includes: All]   Goal 13Oct2016 [id number] 15Oct2015  Free PSA      % Free PSA      PSA  4.05 ng/mL 2.85 ng/mL 7.43 ng/mL  Assessment Assessed  1. Abdominal pain, RLQ (right lower quadrant) (R10.31) 2. History of renal calculi (Z87.442) 3. Weak urinary stream (R39.12) 4. Prostate cancer (C61)  He has RLQ and back pain of uncertain etiology. He has no recurrent stones.  His PSA was up in October and  he has T1a Gleason 6 prostate cancer.  He has a reduced stream with incomplete emptying.   Plan Abdominal pain, RLQ (right lower quadrant), PMH: History of kidney stones  1. AU CT-STONE PROTOCOL; Status:In Progress - Specimen/Data Collected;   Done:  21Dec2016 09:55AM Health Maintenance  2. UA With REFLEX; [Do Not Release];  Status:Resulted - Requires Verification;   [id number] 10:38AM Nocturia  3. RENAL U/S COMPLETE; Status:Canceled - Appointment,Date of Service;  Weak urinary stream  4. Complex Uroflowmetry; Status:Hold For - Appointment,Date of Service; Requested  for:21Dec2016;  5. PVR U/S; Status:Hold For - Appointment,Date of Service; Requested for:21Dec2016;   I have given him some Rapaflo samples to try but will set him up for a prostate [name] and biopsy and possible incision of a BNC in early January. I reviewed the risks of the procedure in detail.  I encouraged him to speak to his PCP about the dry mouth.   Discussion/Summary CC: Dr. Lyda Jester.

## 2015-10-24 NOTE — Telephone Encounter (Signed)
Caller name: Self   Can be reached: [phone]  Pharmacy:  [facility] [id number] - [place], [place] [phone] (Phone) [phone] (Fax)        Reason for call: Requesting 90 day supply and 2 refills on Fluticasone Furoate-Vilanterol (BREO ELLIPTA) 100-25 MCG/INH AEPB [id number] states pharmacy will not refill unless it is written this way

## 2015-10-25 ENCOUNTER — Ambulatory Visit (HOSPITAL_COMMUNITY): Payer: Medicare Other | Admitting: Anesthesiology

## 2015-10-25 ENCOUNTER — Encounter (HOSPITAL_COMMUNITY)
Admission: RE | Disposition: A | Discharge status: Home / Self Care | Payer: Self-pay | Source: Ambulatory Visit | Attending: Urology

## 2015-10-25 ENCOUNTER — Encounter (HOSPITAL_COMMUNITY): Payer: Self-pay | Admitting: *Deleted

## 2015-10-25 ENCOUNTER — Ambulatory Visit (HOSPITAL_COMMUNITY)
Admission: RE | Admit: 2015-10-25 | Discharge: 2015-10-25 | Disposition: A | Discharge status: Home / Self Care | Payer: Medicare Other | Source: Ambulatory Visit | Attending: Urology | Admitting: Urology

## 2015-10-25 DIAGNOSIS — Z9981 Dependence on supplemental oxygen: Secondary | ICD-10-CM | POA: Diagnosis not present

## 2015-10-25 DIAGNOSIS — N401 Enlarged prostate with lower urinary tract symptoms: Secondary | ICD-10-CM | POA: Diagnosis not present

## 2015-10-25 DIAGNOSIS — Z79899 Other long term (current) drug therapy: Secondary | ICD-10-CM | POA: Insufficient documentation

## 2015-10-25 DIAGNOSIS — N189 Chronic kidney disease, unspecified: Secondary | ICD-10-CM | POA: Diagnosis not present

## 2015-10-25 DIAGNOSIS — M1990 Unspecified osteoarthritis, unspecified site: Secondary | ICD-10-CM | POA: Insufficient documentation

## 2015-10-25 DIAGNOSIS — R351 Nocturia: Secondary | ICD-10-CM | POA: Diagnosis not present

## 2015-10-25 DIAGNOSIS — C61 Malignant neoplasm of prostate: Secondary | ICD-10-CM | POA: Insufficient documentation

## 2015-10-25 DIAGNOSIS — R3912 Poor urinary stream: Secondary | ICD-10-CM | POA: Diagnosis not present

## 2015-10-25 DIAGNOSIS — I10 Essential (primary) hypertension: Secondary | ICD-10-CM | POA: Diagnosis not present

## 2015-10-25 DIAGNOSIS — F419 Anxiety disorder, unspecified: Secondary | ICD-10-CM | POA: Insufficient documentation

## 2015-10-25 DIAGNOSIS — M109 Gout, unspecified: Secondary | ICD-10-CM | POA: Diagnosis not present

## 2015-10-25 DIAGNOSIS — K219 Gastro-esophageal reflux disease without esophagitis: Secondary | ICD-10-CM | POA: Diagnosis not present

## 2015-10-25 DIAGNOSIS — G4730 Sleep apnea, unspecified: Secondary | ICD-10-CM | POA: Diagnosis not present

## 2015-10-25 DIAGNOSIS — F3130 Bipolar disorder, current episode depressed, mild or moderate severity, unspecified: Secondary | ICD-10-CM | POA: Insufficient documentation

## 2015-10-25 DIAGNOSIS — Z87442 Personal history of urinary calculi: Secondary | ICD-10-CM | POA: Insufficient documentation

## 2015-10-25 DIAGNOSIS — N500 Atrophy of testis: Secondary | ICD-10-CM | POA: Diagnosis not present

## 2015-10-25 DIAGNOSIS — J449 Chronic obstructive pulmonary disease, unspecified: Secondary | ICD-10-CM | POA: Insufficient documentation

## 2015-10-25 DIAGNOSIS — J45909 Unspecified asthma, uncomplicated: Secondary | ICD-10-CM | POA: Insufficient documentation

## 2015-10-25 DIAGNOSIS — I129 Hypertensive chronic kidney disease with stage 1 through stage 4 chronic kidney disease, or unspecified chronic kidney disease: Secondary | ICD-10-CM | POA: Diagnosis not present

## 2015-10-25 DIAGNOSIS — F17290 Nicotine dependence, other tobacco product, uncomplicated: Secondary | ICD-10-CM | POA: Diagnosis not present

## 2015-10-25 DIAGNOSIS — R972 Elevated prostate specific antigen [PSA]: Secondary | ICD-10-CM | POA: Diagnosis present

## 2015-10-25 DIAGNOSIS — N5201 Erectile dysfunction due to arterial insufficiency: Secondary | ICD-10-CM | POA: Diagnosis not present

## 2015-10-25 DIAGNOSIS — Q984 Klinefelter syndrome, unspecified: Secondary | ICD-10-CM | POA: Diagnosis not present

## 2015-10-25 DIAGNOSIS — G14 Postpolio syndrome: Secondary | ICD-10-CM | POA: Diagnosis not present

## 2015-10-25 HISTORY — PX: TRANSURETHRAL INCISION OF BLADDER NECK: SHX6152

## 2015-10-25 HISTORY — DX: Depression, unspecified: F32.A

## 2015-10-25 HISTORY — PX: PROSTATE BIOPSY: SHX241

## 2015-10-25 HISTORY — DX: Personal history of traumatic brain injury: Z87.820

## 2015-10-25 HISTORY — DX: Schizophrenia, unspecified: F20.9

## 2015-10-25 HISTORY — PX: CYSTOSCOPY: SHX5120

## 2015-10-25 HISTORY — DX: Family history of other specified conditions: Z84.89

## 2015-10-25 HISTORY — DX: Major depressive disorder, single episode, unspecified: F32.9

## 2015-10-25 LAB — BASIC METABOLIC PANEL
Anion gap: 9 (ref 5–15)
BUN: 13 mg/dL (ref 6–20)
CHLORIDE: 105 mmol/L (ref 101–111)
CO2: 24 mmol/L (ref 22–32)
Calcium: 9.7 mg/dL (ref 8.9–10.3)
Creatinine, Ser: 1.15 mg/dL (ref 0.61–1.24)
GFR calc non Af Amer: >60 mL/min (ref >60)
Glucose, Bld: 129 mg/dL — ABNORMAL HIGH (ref 65–99)
POTASSIUM: 4.5 mmol/L (ref 3.5–5.1)
SODIUM: 138 mmol/L (ref 135–145)

## 2015-10-25 LAB — CBC
HEMATOCRIT: 44.1 % (ref 39.0–52.0)
Hemoglobin: 15.5 g/dL (ref 13.0–17.0)
MCH: 31.9 pg (ref 26.0–34.0)
MCHC: 35.1 g/dL (ref 30.0–36.0)
MCV: 90.7 fL (ref 78.0–100.0)
PLATELETS: 249 10*3/uL (ref 150–400)
RBC: 4.86 MIL/uL (ref 4.22–5.81)
RDW: 12.9 % (ref 11.5–15.5)
WBC: 6.7 10*3/uL (ref 4.0–10.5)

## 2015-10-25 LAB — GLUCOSE, CAPILLARY
GLUCOSE-CAPILLARY: 158 mg/dL — AB (ref 65–99)
Glucose-Capillary: 105 mg/dL — ABNORMAL HIGH (ref 65–99)

## 2015-10-25 SURGERY — BIOPSY, PROSTATE
Anesthesia: General

## 2015-10-25 MED ORDER — SODIUM CHLORIDE 0.9 % IJ SOLN: 3.0000 mL | INTRAMUSCULAR | Status: DC | PRN

## 2015-10-25 MED ORDER — SULFAMETHOXAZOLE-TRIMETHOPRIM 800-160 MG PO TABS: 1.0000 | ORAL_TABLET | Freq: Two times a day (BID) | ORAL | Status: DC

## 2015-10-25 MED ORDER — ACETAMINOPHEN 650 MG RE SUPP
650.0000 mg | RECTAL | Status: DC | PRN
  Filled 2015-10-25: qty 1

## 2015-10-25 MED ORDER — LIDOCAINE HCL (CARDIAC) 20 MG/ML IV SOLN
INTRAVENOUS | Status: DC | PRN
  Administered 2015-10-25: 100 mg via INTRAVENOUS

## 2015-10-25 MED ORDER — PROMETHAZINE HCL 25 MG/ML IJ SOLN
6.2500 mg | INTRAMUSCULAR | Status: DC | PRN
  Administered 2015-10-25: 12.5 mg via INTRAVENOUS

## 2015-10-25 MED ORDER — FENTANYL CITRATE (PF) 100 MCG/2ML IJ SOLN: 25.0000 ug | INTRAMUSCULAR | Status: DC | PRN

## 2015-10-25 MED ORDER — ONDANSETRON HCL 4 MG/2ML IJ SOLN
INTRAMUSCULAR | Status: AC
  Filled 2015-10-25: qty 2

## 2015-10-25 MED ORDER — PROMETHAZINE HCL 25 MG/ML IJ SOLN
INTRAMUSCULAR | Status: AC
  Filled 2015-10-25: qty 1

## 2015-10-25 MED ORDER — OXYCODONE HCL 5 MG PO TABS: 5.0000 mg | ORAL_TABLET | ORAL | Status: DC | PRN

## 2015-10-25 MED ORDER — FENTANYL CITRATE (PF) 100 MCG/2ML IJ SOLN
25.0000 ug | INTRAMUSCULAR | Status: DC | PRN
  Administered 2015-10-25 (×2): 50 ug via INTRAVENOUS

## 2015-10-25 MED ORDER — VANCOMYCIN HCL IN DEXTROSE 1-5 GM/200ML-% IV SOLN
1000.0000 mg | INTRAVENOUS | Status: AC
  Administered 2015-10-25: 1000 mg via INTRAVENOUS
  Filled 2015-10-25: qty 200

## 2015-10-25 MED ORDER — PROPOFOL 10 MG/ML IV BOLUS
INTRAVENOUS | Status: AC
  Filled 2015-10-25: qty 20

## 2015-10-25 MED ORDER — MEPERIDINE HCL 50 MG/ML IJ SOLN: 6.2500 mg | INTRAMUSCULAR | Status: DC | PRN

## 2015-10-25 MED ORDER — EPHEDRINE SULFATE 50 MG/ML IJ SOLN
INTRAMUSCULAR | Status: AC
  Filled 2015-10-25: qty 1

## 2015-10-25 MED ORDER — SODIUM CHLORIDE 0.9 % IV SOLN: 250.0000 mL | INTRAVENOUS | Status: DC | PRN

## 2015-10-25 MED ORDER — SODIUM CHLORIDE 0.9 % IJ SOLN
INTRAMUSCULAR | Status: AC
  Filled 2015-10-25: qty 10

## 2015-10-25 MED ORDER — SODIUM CHLORIDE 0.9 % IJ SOLN: 3.0000 mL | Freq: Two times a day (BID) | INTRAMUSCULAR | Status: DC

## 2015-10-25 MED ORDER — ONDANSETRON HCL 4 MG/2ML IJ SOLN
INTRAMUSCULAR | Status: DC | PRN
  Administered 2015-10-25: 4 mg via INTRAVENOUS

## 2015-10-25 MED ORDER — ACETAMINOPHEN 325 MG PO TABS: 650.0000 mg | ORAL_TABLET | ORAL | Status: DC | PRN

## 2015-10-25 MED ORDER — LACTATED RINGERS IV SOLN: INTRAVENOUS | Status: DC

## 2015-10-25 MED ORDER — FENTANYL CITRATE (PF) 100 MCG/2ML IJ SOLN
INTRAMUSCULAR | Status: AC
  Filled 2015-10-25: qty 2

## 2015-10-25 MED ORDER — PROPOFOL 10 MG/ML IV BOLUS
INTRAVENOUS | Status: DC | PRN
  Administered 2015-10-25: 50 mg via INTRAVENOUS
  Administered 2015-10-25: 200 mg via INTRAVENOUS

## 2015-10-25 MED ORDER — LACTATED RINGERS IV SOLN
INTRAVENOUS | Status: DC | PRN
  Administered 2015-10-25 (×2): via INTRAVENOUS

## 2015-10-25 MED ORDER — FENTANYL CITRATE (PF) 100 MCG/2ML IJ SOLN
INTRAMUSCULAR | Status: DC | PRN
  Administered 2015-10-25: 50 ug via INTRAVENOUS

## 2015-10-25 MED ORDER — FLUTICASONE FUROATE-VILANTEROL 100-25 MCG/INH IN AEPB: INHALATION_SPRAY | RESPIRATORY_TRACT | Status: DC

## 2015-10-25 SURGICAL SUPPLY — 13 items
BAG URO CATCHER STRL LF (MISCELLANEOUS) ×2 IMPLANT
CATH ROBINSON RED A/P 16FR (CATHETERS) IMPLANT
CATH URET 5FR 28IN OPEN ENDED (CATHETERS) IMPLANT
CLOTH BEACON ORANGE TIMEOUT ST (SAFETY) ×2 IMPLANT
ELECT BIVAP BIPO 22/24 DONUT (ELECTROSURGICAL) ×2
ELECTRD BIVAP BIPO 22/24 DONUT (ELECTROSURGICAL) ×1 IMPLANT
GLOVE SURG SS PI 8.0 STRL IVOR (GLOVE) ×2 IMPLANT
GOWN STRL REUS W/TWL XL LVL3 (GOWN DISPOSABLE) ×2 IMPLANT
MANIFOLD NEPTUNE II (INSTRUMENTS) ×2 IMPLANT
PACK CYSTO (CUSTOM PROCEDURE TRAY) ×2 IMPLANT
SYR CONTROL 10ML LL (SYRINGE) IMPLANT
TUBING CONNECTING 10 (TUBING) ×2 IMPLANT
UNDERPAD 30X30 INCONTINENT (UNDERPADS AND DIAPERS) ×2 IMPLANT

## 2015-10-25 NOTE — Telephone Encounter (Signed)
Called and spoke with the pharmacy and phoned in a 3- month supply.//AB/CMA

## 2015-10-25 NOTE — Anesthesia Postprocedure Evaluation (Signed)
Anesthesia Post Note  Patient: [name]  Procedure(s) Performed: Procedure(s) (LRB): PROSTATE BIOPSY AND ULTRASOUND (N/A)  TRANSURETHRAL INCISION OF BLADDER NECK (N/A) CYSTOSCOPY (N/A)  Patient location during evaluation: PACU Anesthesia Type: General Level of consciousness: awake and alert Pain management: pain level controlled Vital Signs Assessment: post-procedure vital signs reviewed and stable Respiratory status: spontaneous breathing, nonlabored ventilation, respiratory function stable and patient connected to nasal cannula oxygen Cardiovascular status: blood pressure returned to baseline and stable Postop Assessment: no signs of nausea or vomiting Anesthetic complications: no    Last Vitals:  Filed Vitals:   10/25/15 1110 10/25/15 1205  BP: 119/63 104/48  Pulse:  64  Temp: 36.5 C   Resp: 12 20    Last Pain:  Filed Vitals:   10/25/15 1212  PainSc: Asleep                 Effie Berkshire

## 2015-10-25 NOTE — Transfer of Care (Signed)
Immediate Anesthesia Transfer of Care Note  Patient: [name]  Procedure(s) Performed: Procedure(s): PROSTATE BIOPSY AND ULTRASOUND (N/A)  TRANSURETHRAL INCISION OF BLADDER NECK (N/A) CYSTOSCOPY (N/A)  Patient Location: PACU  Anesthesia Type:General  Level of Consciousness: awake, alert  and oriented  Airway & Oxygen Therapy: Patient Spontanous Breathing and Patient connected to face mask oxygen  Post-op Assessment: Report given to RN and Post -op Vital signs reviewed and stable  Post vital signs: Reviewed and stable  Last Vitals:  Filed Vitals:   10/25/15 0739  BP: 129/66  Pulse: 74  Temp: 36.7 C  Resp: 18    Complications: No apparent anesthesia complications

## 2015-10-25 NOTE — Interval H&P Note (Signed)
History and Physical Interval Note:  Will switch abx to Vanc from Rocephin for the PCN allergy and continue with the [place].  He will be sent out on Bactrim for 3 days.   10/25/2015 8:53 AM  [name]  has presented today for surgery, with the diagnosis of prostate cancer and weak stream  The various methods of treatment have been discussed with the patient and family. After consideration of risks, benefits and other options for treatment, the patient has consented to  Procedure(s): PROSTATE BIOPSY AND ULTRASOUND (N/A) POSSIBLE TRANSURETHRAL INCISION OF BLADDER NECK (N/A) CYSTOSCOPY (N/A) as a surgical intervention .  The patient's history has been reviewed, patient examined, no change in status, stable for surgery.  I have reviewed the patient's chart and labs.  Questions were answered to the patient's satisfaction.     [name]

## 2015-10-25 NOTE — Anesthesia Procedure Notes (Signed)
Procedure Name: LMA Insertion Date/Time: 10/25/2015 9:14 AM Performed by: Danley Danker L Patient Re-evaluated:Patient Re-evaluated prior to inductionOxygen Delivery Method: Circle system utilized Preoxygenation: Pre-oxygenation with 100% oxygen Intubation Type: IV induction Ventilation: Mask ventilation without difficulty LMA: LMA inserted LMA Size: 4.0 Number of attempts: 1 Placement Confirmation: positive ETCO2 and breath sounds checked- equal and bilateral Tube secured with: Tape Dental Injury: Teeth and Oropharynx as per pre-operative assessment

## 2015-10-25 NOTE — Op Note (Signed)
[name], [name]              ACCOUNT NO.:  [id number]  MEDICAL RECORD NO.:  [id number]  LOCATION:  [facility]                         FACILITY:  [facility]  PHYSICIAN:  Marshall Cork. Jeffie Pollock, M.D.    DATE OF BIRTH:  08/31/1951  DATE OF PROCEDURE:  10/25/2015 DATE OF DISCHARGE:                              OPERATIVE REPORT   PROCEDURE: 1. Transrectal ultrasound of the prostate with ultrasound-guided     prostate biopsy. 2. Cystoscopy with transurethral electrovaporization of the prostate.  PREOPERATIVE DIAGNOSIS:  History of prostate cancer with elevated PSA and weak urinary stream.  POSTOPERATIVE DIAGNOSIS:  History of prostate cancer with elevated PSA and weak urinary stream.  SURGEON:  Marshall Cork. Jeffie Pollock, MD  ANESTHESIA:  General.  SPECIMEN:  12-core prostate biopsy.  DRAINS:  None.  BLOOD LOSS:  None.  COMPLICATIONS:  None.  INDICATIONS:  Mr. Popwell is a 65 year old white male, who had a transurethral resection of the prostate in December 2014.  On his final pathology, he was noted to have a T1a Gleason 6 adenocarcinoma of the prostate.  His PSA has been as high as just over 7, but more recently was approximately 4.  He has not had a transrectal biopsy for confirmatory purposes.  He also has a persistent weak stream that requires Rapaflo.  It was felt that transrectal prostate biopsy and possible bladder neck incision were indicated.  FINDINGS OF PROCEDURE:  Because of his allergy profile, he was given gentamicin and vancomycin perioperatively.  A general anesthetic was induced.  He was placed in a lithotomy position.  The transrectal ultrasound probe was assembled and inserted and he was scanned at 10 megahertz.  Examination revealed normal seminal vesicles. The prostate had an isoechoic peripheral zone without distortion or worrisome lesions.  The transitional zone had a normal variegated echotexture with a proximal TUR defect.  There were scant calcifications and no obvious  hypoechoic lesions to raise suspicions for bulky cancer. His prostatic volume was 22.84 mL with a length of 3.51 cm, height of 2.62 cm, and a width of 4.74 cm.  After completion of the diagnostic scan, a 12 core standard pattern biopsy was performed with each core placed in its on bottle.  Once the biopsy was complete, the probe was removed.  There was no significant rectal bleeding noted.  The patient was then prepped with Betadine solution and draped in usual sterile fashion.  Cystoscopy was performed using a 23-French scope with the 30-degree lens.  Examination revealed a normal urethra.  The external sphincter was intact.  There was some apical regrowth of the prostate with coaptation, but the proximal TUR defect was widely patent without bladder neck contraction.  The left ureteral orifice was unremarkable, but there was a Hutch diverticulum approximately 2 cm in size just lateral to the orifice.  The right ureteral orifice was wide mouth and somewhat ectopic right at the right bladder neck and appeared to have had prior resection.  The bladder wall had moderate trabeculation, but no mucosal abnormalities were noted.  There was just a small clot in the bladder from the prostate biopsy, but that was evacuated.  At this point, the urethra was dilated to 32-French  with Leander Rams sounds and a 28-French continuous flow resectoscope sheath was inserted, this was fitted with an Beatrix Fetters handle with a button electrode.  I felt that it was worthwhile to try to eliminate the apical tissue and incise the bladder neck more completely to see if that will improve his voiding parameters.  Once the scope was in place, the apical residual tissue was vaporized with the button electrode and the vaporization beds were fulgurated.  I then made a deep channel at 6 o'clock, down to the bladder neck fibers and widened it laterally as needed.  Once this deep channel was created, the vaporized area  was fulgurated for hemostatic purposes.  At this point, no injury to the ureteral orifices or bladder.  The bladder was left full.  The scope was removed.  The bladder was left partially full.  Pressure on the bladder produced an excellent stream of clear urine and it was not felt that a Foley catheter was indicated.  The patient was then taken down from lithotomy position.  His anesthetic was reversed.  He was moved to recovery room in stable condition.  There were no complications.     Marshall Cork. Jeffie Pollock, M.D.     JJW/MEDQ  D:  10/25/2015  T:  10/25/2015  Job:  [id number]

## 2015-10-25 NOTE — Brief Op Note (Signed)
10/25/2015  10:00 AM  PATIENT:  [name]  65 y.o. male  PRE-OPERATIVE DIAGNOSIS:  prostate cancer and weak stream  POST-OPERATIVE DIAGNOSIS: Same  PROCEDURE:  Procedure(s): PROSTATE BIOPSY AND ULTRASOUND (N/A) POSSIBLE TRANSURETHRAL INCISION OF BLADDER NECK (N/A) CYSTOSCOPY (N/A)  SURGEON:  Surgeon(s) and Role:    * [name], MD - Primary  PHYSICIAN ASSISTANT:   ASSISTANTS: none   ANESTHESIA:   general  EBL:     BLOOD ADMINISTERED:none  DRAINS: none   LOCAL MEDICATIONS USED:  NONE  SPECIMEN:  Source of Specimen:  12 core prostate biopsy  DISPOSITION OF SPECIMEN:  PATHOLOGY  COUNTS:  YES  TOURNIQUET:  * No tourniquets in log *  DICTATION: .Other Dictation: Dictation Number [id number]  PLAN OF CARE: Discharge to home after PACU  PATIENT DISPOSITION:  PACU - hemodynamically stable.   Delay start of Pharmacological VTE agent (>24hrs) due to surgical blood loss or risk of bleeding: yes

## 2015-10-25 NOTE — Discharge Instructions (Addendum)
Transurethral Resection of the Prostate, Care After Refer to this sheet in the next few weeks. These instructions provide you with information on caring for yourself after your procedure. Your caregiver also may give you specific instructions. Your treatment has been planned according to current medical practices, but complications sometimes occur. Call your caregiver if you have any problems or questions after your procedure. HOME CARE INSTRUCTIONS  Recovery can take 4-6 weeks. Avoid alcohol, caffeinated drinks, and spicy foods for 2 weeks after your procedure. Drink enough fluids to keep your urine clear or pale yellow. Urinate as soon as you feel the urge to do so. Do not try to hold your urine for long periods of time. During recovery you may experience pain caused by bladder spasms, which result in a very intense urge to urinate. Take all medicines as directed by your caregiver, including medicines for pain. Try to limit the amount of pain medicines you take because it can cause constipation. If you do become constipated, do not strain to move your bowels. Straining can increase bleeding. Constipation can be minimized by increasing the amount fluids and fiber in your diet. Your caregiver also may prescribe a stool softener. Do not lift heavy objects (more than 5 lb [2.25 kg]) or perform exercises that cause you to strain for at least 1 month after your procedure. When sitting, you may want to sit in a soft chair or use a cushion. For the first 10 days after your procedure, avoid the following activities:  Running.  Strenuous work.  Long walks.  Riding in a car for extended periods.  Sex. SEEK MEDICAL CARE IF:  You have difficulty urinating.  You have blood in your urine that does not go away after you rest or increase your fluid intake.  You have swelling in your penis or scrotum. SEEK IMMEDIATE MEDICAL CARE IF:   You are suddenly unable to urinate.  You notice blood clots in your  urine.  You have chills.  You have a fever.  You have pain in your back or lower abdomen.  You have pain or swelling in your legs. MAKE SURE YOU:   Understand these instructions.  Will watch your condition.  Will get help right away if you are not doing well or get worse.   This information is not intended to replace advice given to you by your health care provider. Make sure you discuss any questions you have with your health care provider.   Document Released: 10/01/2005 Document Revised: 10/22/2014 Document Reviewed: 11/09/2011 Elsevier Interactive Patient Education 2016 [place] Anesthesia, Adult, Care After Refer to this sheet in the next few weeks. These instructions provide you with information on caring for yourself after your procedure. Your health care provider may also give you more specific instructions. Your treatment has been planned according to current medical practices, but problems sometimes occur. Call your health care provider if you have any problems or questions after your procedure. WHAT TO EXPECT AFTER THE PROCEDURE After the procedure, it is typical to experience:  Sleepiness.  Nausea and vomiting. HOME CARE INSTRUCTIONS  For the first 24 hours after general anesthesia:  Have a responsible person with you.  Do not drive a car. If you are alone, do not take public transportation.  Do not drink alcohol.  Do not take medicine that has not been prescribed by your health care provider.  Do not sign important papers or make important decisions.  You may resume a normal diet  and activities as directed by your health care provider.  Change bandages (dressings) as directed.  If you have questions or problems that seem related to general anesthesia, call the hospital and ask for the anesthetist or anesthesiologist on call. SEEK MEDICAL CARE IF:  You have nausea and vomiting that continue the day after anesthesia.  You develop a  rash. SEEK IMMEDIATE MEDICAL CARE IF:   You have difficulty breathing.  You have chest pain.  You have any allergic problems.   This information is not intended to replace advice given to you by your health care provider. Make sure you discuss any questions you have with your health care provider.   Document Released: 01/07/2001 Document Revised: 10/22/2014 Document Reviewed: 01/30/2012 Elsevier Interactive Patient Education [employer].

## 2015-10-26 ENCOUNTER — Emergency Department (HOSPITAL_COMMUNITY)
Admission: EM | Admit: 2015-10-26 | Discharge: 2015-10-26 | Discharge status: Against Medical Advice | Payer: Medicare Other | Attending: Emergency Medicine | Admitting: Emergency Medicine

## 2015-10-26 ENCOUNTER — Emergency Department (HOSPITAL_COMMUNITY): Payer: Medicare Other

## 2015-10-26 ENCOUNTER — Encounter (HOSPITAL_COMMUNITY): Payer: Self-pay | Admitting: Emergency Medicine

## 2015-10-26 DIAGNOSIS — J441 Chronic obstructive pulmonary disease with (acute) exacerbation: Secondary | ICD-10-CM | POA: Diagnosis not present

## 2015-10-26 DIAGNOSIS — E119 Type 2 diabetes mellitus without complications: Secondary | ICD-10-CM | POA: Diagnosis not present

## 2015-10-26 DIAGNOSIS — R42 Dizziness and giddiness: Secondary | ICD-10-CM | POA: Diagnosis not present

## 2015-10-26 DIAGNOSIS — I10 Essential (primary) hypertension: Secondary | ICD-10-CM | POA: Diagnosis not present

## 2015-10-26 DIAGNOSIS — R220 Localized swelling, mass and lump, head: Secondary | ICD-10-CM | POA: Insufficient documentation

## 2015-10-26 DIAGNOSIS — L299 Pruritus, unspecified: Secondary | ICD-10-CM | POA: Diagnosis not present

## 2015-10-26 DIAGNOSIS — R05 Cough: Secondary | ICD-10-CM | POA: Diagnosis not present

## 2015-10-26 DIAGNOSIS — R0602 Shortness of breath: Secondary | ICD-10-CM | POA: Diagnosis not present

## 2015-10-26 LAB — COMPREHENSIVE METABOLIC PANEL
ALK PHOS: 58 U/L (ref 38–126)
ALT: 31 U/L (ref 17–63)
ANION GAP: 9 (ref 5–15)
AST: 24 U/L (ref 15–41)
Albumin: 4.4 g/dL (ref 3.5–5.0)
BILIRUBIN TOTAL: 0.9 mg/dL (ref 0.3–1.2)
BUN: 18 mg/dL (ref 6–20)
CALCIUM: 9 mg/dL (ref 8.9–10.3)
CO2: 27 mmol/L (ref 22–32)
CREATININE: 1.66 mg/dL — AB (ref 0.61–1.24)
Chloride: 101 mmol/L (ref 101–111)
GFR calc non Af Amer: 42 mL/min — ABNORMAL LOW (ref >60)
GFR, EST AFRICAN AMERICAN: 48 mL/min — AB (ref >60)
GLUCOSE: 120 mg/dL — AB (ref 65–99)
Potassium: 4.1 mmol/L (ref 3.5–5.1)
Sodium: 137 mmol/L (ref 135–145)
TOTAL PROTEIN: 7.6 g/dL (ref 6.5–8.1)

## 2015-10-26 LAB — CBC
HCT: 41.3 % (ref 39.0–52.0)
HEMOGLOBIN: 13.4 g/dL (ref 13.0–17.0)
MCH: 31.1 pg (ref 26.0–34.0)
MCHC: 32.4 g/dL (ref 30.0–36.0)
MCV: 95.8 fL (ref 78.0–100.0)
PLATELETS: 222 10*3/uL (ref 150–400)
RBC: 4.31 MIL/uL (ref 4.22–5.81)
RDW: 13.2 % (ref 11.5–15.5)
WBC: 8.1 10*3/uL (ref 4.0–10.5)

## 2015-10-26 LAB — TROPONIN I

## 2015-10-26 NOTE — Progress Notes (Addendum)
Patient stated on post op call on 10-26-2015 at 0910,  that he got down the road about 2 miles after DC from hospital and felt the cramping like he needed to have a bowel movement. He stopped to use restroom , did not have a bowel movement but did begin to vomit. Vomited several times on the way home. Used a fleet enema w/o relief. He took his ususal pain meds at home of Oxycodone IR w some relief but then began having hives so started on benadryl. Has not picked up the RX for antibiotic yet. Patient is going to take tylenol for pain today and see if hives go away. Not nauseated this am. Will Call DR Ralene Muskrat office "if need be". Voiding w/o difficulty

## 2015-10-26 NOTE — ED Notes (Signed)
Pt told registration clerk he was leaving

## 2015-10-26 NOTE — ED Notes (Signed)
Pt had bladder and prostate outpatient surgery here yesterday, pt states this morning he felt dizzy, was itching and his tongue began to swell. Pt c/o SOB and states that his skin has turned red. Pt states he is urinating with rapidflow medication.

## 2015-11-06 ENCOUNTER — Encounter (HOSPITAL_BASED_OUTPATIENT_CLINIC_OR_DEPARTMENT_OTHER): Payer: Self-pay | Admitting: Emergency Medicine

## 2015-11-06 ENCOUNTER — Emergency Department (HOSPITAL_BASED_OUTPATIENT_CLINIC_OR_DEPARTMENT_OTHER)
Admission: EM | Admit: 2015-11-06 | Discharge: 2015-11-07 | Disposition: A | Discharge status: Home / Self Care | Payer: Medicare Other | Attending: Emergency Medicine | Admitting: Emergency Medicine

## 2015-11-06 ENCOUNTER — Emergency Department (HOSPITAL_BASED_OUTPATIENT_CLINIC_OR_DEPARTMENT_OTHER): Payer: Medicare Other

## 2015-11-06 DIAGNOSIS — W19XXXA Unspecified fall, initial encounter: Secondary | ICD-10-CM

## 2015-11-06 DIAGNOSIS — Z880 Allergy status to penicillin: Secondary | ICD-10-CM | POA: Insufficient documentation

## 2015-11-06 DIAGNOSIS — Z87891 Personal history of nicotine dependence: Secondary | ICD-10-CM | POA: Diagnosis not present

## 2015-11-06 DIAGNOSIS — Z85820 Personal history of malignant melanoma of skin: Secondary | ICD-10-CM | POA: Diagnosis not present

## 2015-11-06 DIAGNOSIS — Z8669 Personal history of other diseases of the nervous system and sense organs: Secondary | ICD-10-CM | POA: Diagnosis not present

## 2015-11-06 DIAGNOSIS — S63511A Sprain of carpal joint of right wrist, initial encounter: Secondary | ICD-10-CM | POA: Diagnosis not present

## 2015-11-06 DIAGNOSIS — M542 Cervicalgia: Secondary | ICD-10-CM | POA: Diagnosis not present

## 2015-11-06 DIAGNOSIS — Z8619 Personal history of other infectious and parasitic diseases: Secondary | ICD-10-CM | POA: Insufficient documentation

## 2015-11-06 DIAGNOSIS — W010XXA Fall on same level from slipping, tripping and stumbling without subsequent striking against object, initial encounter: Secondary | ICD-10-CM | POA: Insufficient documentation

## 2015-11-06 DIAGNOSIS — F419 Anxiety disorder, unspecified: Secondary | ICD-10-CM | POA: Insufficient documentation

## 2015-11-06 DIAGNOSIS — S6991XA Unspecified injury of right wrist, hand and finger(s), initial encounter: Secondary | ICD-10-CM | POA: Diagnosis present

## 2015-11-06 DIAGNOSIS — E119 Type 2 diabetes mellitus without complications: Secondary | ICD-10-CM | POA: Insufficient documentation

## 2015-11-06 DIAGNOSIS — Z8611 Personal history of tuberculosis: Secondary | ICD-10-CM | POA: Diagnosis not present

## 2015-11-06 DIAGNOSIS — K219 Gastro-esophageal reflux disease without esophagitis: Secondary | ICD-10-CM | POA: Diagnosis not present

## 2015-11-06 DIAGNOSIS — M25531 Pain in right wrist: Secondary | ICD-10-CM | POA: Diagnosis not present

## 2015-11-06 DIAGNOSIS — Y998 Other external cause status: Secondary | ICD-10-CM | POA: Diagnosis not present

## 2015-11-06 DIAGNOSIS — Z9981 Dependence on supplemental oxygen: Secondary | ICD-10-CM | POA: Insufficient documentation

## 2015-11-06 DIAGNOSIS — F329 Major depressive disorder, single episode, unspecified: Secondary | ICD-10-CM | POA: Insufficient documentation

## 2015-11-06 DIAGNOSIS — J449 Chronic obstructive pulmonary disease, unspecified: Secondary | ICD-10-CM | POA: Insufficient documentation

## 2015-11-06 DIAGNOSIS — Z8701 Personal history of pneumonia (recurrent): Secondary | ICD-10-CM | POA: Insufficient documentation

## 2015-11-06 DIAGNOSIS — Z87448 Personal history of other diseases of urinary system: Secondary | ICD-10-CM | POA: Diagnosis not present

## 2015-11-06 DIAGNOSIS — Z8546 Personal history of malignant neoplasm of prostate: Secondary | ICD-10-CM | POA: Diagnosis not present

## 2015-11-06 DIAGNOSIS — I10 Essential (primary) hypertension: Secondary | ICD-10-CM | POA: Diagnosis not present

## 2015-11-06 DIAGNOSIS — Y9301 Activity, walking, marching and hiking: Secondary | ICD-10-CM | POA: Insufficient documentation

## 2015-11-06 DIAGNOSIS — Z792 Long term (current) use of antibiotics: Secondary | ICD-10-CM | POA: Diagnosis not present

## 2015-11-06 DIAGNOSIS — Z7951 Long term (current) use of inhaled steroids: Secondary | ICD-10-CM | POA: Diagnosis not present

## 2015-11-06 DIAGNOSIS — Y9289 Other specified places as the place of occurrence of the external cause: Secondary | ICD-10-CM | POA: Diagnosis not present

## 2015-11-06 DIAGNOSIS — Z79899 Other long term (current) drug therapy: Secondary | ICD-10-CM | POA: Insufficient documentation

## 2015-11-06 DIAGNOSIS — M25331 Other instability, right wrist: Secondary | ICD-10-CM

## 2015-11-06 DIAGNOSIS — M1990 Unspecified osteoarthritis, unspecified site: Secondary | ICD-10-CM | POA: Insufficient documentation

## 2015-11-06 NOTE — ED Notes (Signed)
Patient states that he fell earlier today onto his back. The patient reports that he continues to have pain to his lower back after continuing normal activities today.

## 2015-11-06 NOTE — ED Notes (Signed)
Patient ambulatory to Xray.

## 2015-11-06 NOTE — ED Provider Notes (Addendum)
CSN: [id number]     Arrival date & time 11/06/15  2206 History  By signing my name below, I, [name], attest that this documentation has been prepared under the direction and in the presence of Shanon Rosser, MD. Electronically Signed: [name], ED Scribe. 11/06/2015. 11:07 PM.     Chief Complaint  Patient presents with  . Fall   The history is provided by the patient and the spouse. No language interpreter was used.   HPI Comments: [name] is a 65 y.o. male who presents to the Emergency Department complaining of a fall that occurred about 10 hours ago. Pt states he was walking up an incline when he slipped and fell backwards, landing on his back and right wrist. He felt a pop in his right wrist when he fell. He notes that afterwards he was having trouble grasping his pliers. He is having difficulty moving his right fourth and fifth fingers due to pain. He complains of a moderate throbbing pain in his right wrist that has been worsening since the fall, exacerbated with palpation or movement. He has not taken anything for pain.   Past Medical History  Diagnosis Date  . Hypertension   . GERD (gastroesophageal reflux disease)   . History of TB (tuberculosis)     1984--  hospitalized for 4 month treatment  . Post-polio syndrome     polio at age 57--PT WAS IN IRON LUNG; PT WAS IN W/C UNTIL AGE 41; STILL HAS WEAKNESS RIGHT SIDE  . History of chronic bronchitis   . History of rheumatic fever   . Bladder outlet obstruction   . Anxiety disorder   . History of urinary retention   . Nocturnal oxygen desaturation     USES O2 NIGHTLY  . Complication of anesthesia     DIFFICULT WAKING   . Diabetes mellitus without complication ([place])     BODERLINE - DIET CONTROL  . Anxiety   . Dysrhythmia     PVC'S  . Arthritis     BILATERAL SHOULDERS, ELBOWS AND HANDS AND LEFT HIP AND KNEES--HX CORTISONE SHOTS IN SHOULDERS, ELBOWS, HIP AND KNEES  . History of oxygen administration      oxygen use 2 l/m nasally at bedtime and exertional occasions  . COPD, frequent exacerbations ([place])     pulmologist-  dr Joya Gaskins--  [place] Rm Stage C.04-25-15 recent COPD exacerbation-much improved now, after tx. in ER [facility].  Marland Kitchen PONV (postoperative nausea and vomiting)   . Family history of adverse reaction to anesthesia     father would wake up with agitation   . Shortness of breath dyspnea     RIGHT HEMIDIAPHRAGM ELEVATION - POST POLIO SYNDROME  . Pneumonia     hx of   . Hx of multiple concussions     x 2 per patient   . Depression   . Schizophrenia ([place])   . Prostate cancer ([place])   . Prostate cancer ([place])   . Melanoma [facility])    Past Surgical History  Procedure Laterality Date  . Other surgical history       Muscle & bone Graft/Polio  . Cystoscopy w/ cystogram/  transrectal ultrasound prostate bx  03-22-2009  . Cardiovascular stress test  06-08-2014  dr Mare Ferrari    normal lexiscan study/  no ischemia/  not gated due to PAC's  . Ureterosopy stone extraction  2000  . Nasal septum surgery  2000  . Shoulder arthroscopy with open rotator cuff repair Bilateral 2013  &  1999    removal spurs and bursectomy  . Laparoscopic cholecystectomy  2013  . Prostate biopsy N/A 09/28/2014    Procedure: PROSTATE ULTRASOUND/BIOPSY;  Surgeon: [name], MD;  Location: [facility];  Service: Urology;  Laterality: N/A;  . Transurethral resection of prostate N/A 09/28/2014    Procedure: TRANSURETHRAL RESECTION OF THE PROSTATE (TURP);  Surgeon: [name], MD;  Location: [facility];  Service: Urology;  Laterality: N/A;  . Esophagogastroduodenoscopy N/A 03/22/2015    Procedure: ESOPHAGOGASTRODUODENOSCOPY (EGD) with dilation;  Surgeon: [name], MD;  Location: [facility];  Service: Endoscopy;  Laterality: N/A;  . Savory dilation N/A 03/22/2015    Procedure: SAVORY DILATION;  Surgeon: [name], MD;  Location: [facility];  Service: Endoscopy;  Laterality: N/A;  . Colonoscopy N/A 05/03/2015    Procedure:  COLONOSCOPY;  Surgeon: [name], MD;  Location: [facility];  Service: Endoscopy;  Laterality: N/A;  . Polio surgeries       14 polio surgeries   . Left elbow surgery       due to fracture   . Prostate biopsy N/A 10/25/2015    Procedure: PROSTATE BIOPSY AND ULTRASOUND;  Surgeon: [name], MD;  Location: [facility];  Service: Urology;  Laterality: N/A;  . Transurethral incision of bladder neck N/A 10/25/2015    Procedure:  TRANSURETHRAL INCISION OF BLADDER NECK;  Surgeon: [name], MD;  Location: [facility];  Service: Urology;  Laterality: N/A;  . Cystoscopy N/A 10/25/2015    Procedure: CYSTOSCOPY;  Surgeon: [name], MD;  Location: [facility];  Service: Urology;  Laterality: N/A;   Family History  Problem Relation Age of Onset  . Alzheimer's disease Father 4    Deceased  . Stomach cancer Father   . Heart attack Father   . Heart disease Father   . Skin cancer Mother     Facial-Living  . Alcohol abuse Sister     x2  . Mental illness Sister     x2  . Diabetes Maternal Aunt     x2  . Thyroid disease Maternal Aunt     x4  . Diabetes Maternal Uncle   . Tuberculosis Paternal Grandfather   . Tuberculosis Paternal Grandmother   . Alzheimer's disease Paternal Aunt   . Alzheimer's disease Paternal Uncle   . Colon cancer Neg Hx   . Colon polyps Neg Hx   . Crohn's disease Neg Hx   . Ulcerative colitis Neg Hx    Social History  Substance Use Topics  . Smoking status: Former Smoker -- 72 years    Quit date: 08/25/2013  . Smokeless tobacco: Former Systems developer  . Alcohol Use: Yes     Comment: average 6 beers per week     Review of Systems  All other systems reviewed and are negative.   Allergies  Ivp dye; Levaquin; Penicillins; Aspirin; Nsaids; and Morphine and related  Home Medications   Prior to Admission medications   Medication Sig Start Date End Date Taking? Authorizing Provider  albuterol (PROVENTIL HFA;VENTOLIN HFA) 108 (90 Base) MCG/ACT inhaler Inhale 2 puffs into the lungs  every 6 (six) hours as needed for wheezing or shortness of breath. 10/20/15   Brunetta Jeans, PA-C  ALPRAZolam [name]) 1 MG tablet Take 0.5 tablets (0.5 mg total) by mouth 3 (three) times daily as needed for anxiety. Patient taking differently: Take 1 mg by mouth daily as needed for anxiety.  09/06/15   Brunetta Jeans, PA-C  amLODipine (  NORVASC) 10 MG tablet Take 1 tablet (10 mg total) by mouth daily. 10/19/15   Brunetta Jeans, PA-C  citalopram (CELEXA) 20 MG tablet Take 1 tablet (20 mg total) by mouth daily. 10/20/15   Brunetta Jeans, PA-C  dexlansoprazole (DEXILANT) 60 MG capsule Take 1 capsule (60 mg total) by mouth daily. 09/28/15   Brunetta Jeans, PA-C  diphenhydrAMINE (SOMINEX) 25 MG tablet Take 25 mg by mouth daily as needed for itching or sleep.    Historical Provider, MD  fluticasone (FLONASE) 50 MCG/ACT nasal spray Place 2 sprays into both nostrils daily. 08/03/15   Brunetta Jeans, PA-C  Fluticasone Furoate-Vilanterol (BREO ELLIPTA) 100-25 MCG/INH AEPB INHALE 1 PUFF INTO THE LUNGS EVERY MORNING. DAILY 10/25/15   Brunetta Jeans, PA-C  loratadine (CLARITIN) 10 MG tablet Take 10 mg by mouth daily. 08/01/15   Historical Provider, MD  metoprolol succinate (TOPROL-XL) 25 MG 24 hr tablet Take 1 tablet (25 mg total) by mouth daily. 10/20/15   Brunetta Jeans, PA-C  montelukast (SINGULAIR) 10 MG tablet TAKE 1 TABLET (10 MG TOTAL) BY MOUTH AT BEDTIME. 10/20/15   Brunetta Jeans, PA-C  nitroGLYCERIN (NITROSTAT) 0.4 MG SL tablet Place 1 tablet (0.4 mg total) under the tongue every 5 (five) minutes as needed for chest pain. 06/21/15   Brunetta Jeans, PA-C  ondansetron (ZOFRAN ODT) 4 MG disintegrating tablet Take 1 tablet (4 mg total) by mouth every 8 (eight) hours as needed for nausea. 09/28/15   Tanna Furry, MD  oxyCODONE (ROXICODONE) 15 MG immediate release tablet Take 1 tablet (15 mg total) by mouth every 8 (eight) hours as needed for pain. Patient taking differently: Take 7.5-15 mg by mouth every  8 (eight) hours as needed for pain.  09/06/15   Brunetta Jeans, PA-C  OXYGEN Inhale 2 L into the lungs at bedtime.    Historical Provider, MD  oxymetazoline (AFRIN) 0.05 % nasal spray Place 3 sprays into both nostrils 2 (two) times daily as needed for congestion.    Historical Provider, MD  predniSONE (DELTASONE) 10 MG tablet Take 10 mg by mouth daily as needed (AECOPD).     Historical Provider, MD  promethazine (PHENERGAN) 25 MG tablet Take 25 mg by mouth every 6 (six) hours as needed for nausea or vomiting.    Historical Provider, MD  silodosin (RAPAFLO) 8 MG CAPS capsule Take 8 mg by mouth daily with breakfast.    Historical Provider, MD  sulfamethoxazole-trimethoprim (BACTRIM DS,SEPTRA DS) 800-160 MG tablet Take 1 tablet by mouth 2 (two) times daily. 10/25/15   [name], MD  tiotropium (SPIRIVA) 18 MCG inhalation capsule Place 1 capsule (18 mcg total) into inhaler and inhale daily. IN AM Patient taking differently: Place 18 mcg into inhaler and inhale at bedtime.  05/05/15   Brunetta Jeans, PA-C   BP 138/77 mmHg  Pulse 82  Temp(Src) 98.8 F (37.1 C) (Oral)  Resp 18  Ht 6' (1.829 m)  Wt 214 lb (97.07 kg)  BMI 29.02 kg/m2  SpO2 97%   Physical Exam General: Well-developed, well-nourished male in no acute distress; appearance consistent with age of record HENT: normocephalic; atraumatic Eyes: pupils equal, round and reactive to light; extraocular muscles intact Neck: supple Heart: regular rate and rhythm Lungs: clear to auscultation bilaterally Abdomen: soft; nondistended; nontender; no masses or hepatosplenomegaly; bowel sounds present Extremities: No acute deformity; pulses normal; TTP distal to the distal ulna with pain on motion of the right 4th and 5th digits,  right hand distally neurovascularly intact with intact tendon function Back: No spinal TTP Neurologic: Awake, alert and oriented; motor function intact in all extremities and symmetric; no facial droop Skin: Warm and  dry Psychiatric: Normal mood and affect   ED Course  Procedures   MDM  Nursing notes and vitals signs, including pulse oximetry, reviewed.  Summary of this visit's results, reviewed by myself:  Imaging Studies: Dg Cervical Spine Complete  11/06/2015  CLINICAL DATA:  Status post fall 10 hours ago, with throbbing pain at the neck. Initial encounter. EXAM: CERVICAL SPINE - COMPLETE 4+ VIEW COMPARISON:  CT of the cervical spine performed 05/18/2015 FINDINGS: There is no evidence of fracture or subluxation. Vertebral bodies demonstrate normal height and alignment. Intervertebral disc spaces are preserved. Scattered small anterior and posterior disc osteophyte complexes are noted along the lower cervical spine. Prevertebral soft tissues are within normal limits. The provided odontoid view demonstrates no significant abnormality. The visualized lung apices are clear. IMPRESSION: No evidence of fracture or subluxation along the cervical spine. Electronically Signed   By: Garald Balding M.D.   On: 11/06/2015 23:57   Dg Wrist Complete Right  11/06/2015  CLINICAL DATA:  Status post fall 10 hours ago, with right wrist pain and difficulty grasping with the right hand. Initial encounter. EXAM: RIGHT WRIST - COMPLETE 3+ VIEW COMPARISON:  None. FINDINGS: There appears to be widening of the scapholunate distance to 4-5 mm, raising concern for very mild scapholunate dissociation. Would correlate for associated symptoms. There is no definite evidence of fracture or dislocation. Minimal degenerative change is noted at the first carpometacarpal joint. No definite soft tissue abnormalities are characterized on radiograph. IMPRESSION: 1. No definite evidence of fracture or dislocation. 2. Apparent widening of the scapholunate distance to 4-5 mm, raising concern for very mild scapholunate dissociation. Would correlate for associated symptoms. Electronically Signed   By: Garald Balding M.D.   On: 11/06/2015 23:56      Final diagnoses:  Scapholunate dissociation of right wrist  Fall, initial encounter   I personally performed the services described in this documentation, which was scribed in my presence. The recorded information has been reviewed and is accurate.   Shanon Rosser, MD 11/07/15 [id number]  Shanon Rosser, MD 11/07/15 [id number]

## 2015-11-07 DIAGNOSIS — S63511A Sprain of carpal joint of right wrist, initial encounter: Secondary | ICD-10-CM | POA: Diagnosis not present

## 2015-11-07 NOTE — ED Notes (Signed)
CMS intact, ROM limited d/t pain, "wrist hurts more than hand", radial/ulnar/brachial pulses intact, cap refill <2sec. Sitting in chair. Alert, NAD, calm, interactive. Wife at [facility].

## 2015-11-09 ENCOUNTER — Ambulatory Visit (INDEPENDENT_AMBULATORY_CARE_PROVIDER_SITE_OTHER): Payer: Medicare Other | Admitting: Physician Assistant

## 2015-11-09 ENCOUNTER — Encounter: Payer: Self-pay | Admitting: Physician Assistant

## 2015-11-09 VITALS — BP 115/66 | HR 78 | Temp 98.0°F | Ht 72.0 in | Wt 212.0 lb

## 2015-11-09 DIAGNOSIS — T887XXA Unspecified adverse effect of drug or medicament, initial encounter: Secondary | ICD-10-CM

## 2015-11-09 DIAGNOSIS — M25331 Other instability, right wrist: Secondary | ICD-10-CM

## 2015-11-09 DIAGNOSIS — R748 Abnormal levels of other serum enzymes: Secondary | ICD-10-CM | POA: Diagnosis not present

## 2015-11-09 DIAGNOSIS — G4733 Obstructive sleep apnea (adult) (pediatric): Secondary | ICD-10-CM

## 2015-11-09 DIAGNOSIS — R7989 Other specified abnormal findings of blood chemistry: Secondary | ICD-10-CM

## 2015-11-09 DIAGNOSIS — T50905A Adverse effect of unspecified drugs, medicaments and biological substances, initial encounter: Secondary | ICD-10-CM

## 2015-11-09 LAB — BASIC METABOLIC PANEL
BUN: 13 mg/dL (ref 6–23)
CALCIUM: 9.1 mg/dL (ref 8.4–10.5)
CO2: 28 mEq/L (ref 19–32)
Chloride: 103 mEq/L (ref 96–112)
Creatinine, Ser: 1.12 mg/dL (ref 0.40–1.50)
GFR: 69.72 mL/min (ref >60.00)
Glucose, Bld: 99 mg/dL (ref 70–99)
POTASSIUM: 4.3 meq/L (ref 3.5–5.1)
SODIUM: 137 meq/L (ref 135–145)

## 2015-11-09 MED ORDER — TRAMADOL HCL ER 300 MG PO TB24: 300.0000 mg | ORAL_TABLET | Freq: Every day | ORAL | Status: DC

## 2015-11-09 NOTE — Progress Notes (Signed)
Pre visit review using our clinic review tool, if applicable. No additional management support is needed unless otherwise documented below in the visit note. 

## 2015-11-09 NOTE — Patient Instructions (Signed)
Please go to the lab for blood work. I will call with your results.  We will let you know about the Dexilant coverage this Friday. Hopefully they will process your paperwork quickly.   Please continue other medications as directed.  Please stop the Oxycodone and begin the new pain medication as directed.  Follow-up in 1 month

## 2015-11-11 DIAGNOSIS — M25531 Pain in right wrist: Secondary | ICD-10-CM | POA: Diagnosis not present

## 2015-11-11 DIAGNOSIS — S63501A Unspecified sprain of right wrist, initial encounter: Secondary | ICD-10-CM | POA: Diagnosis not present

## 2015-11-13 DIAGNOSIS — T50905A Adverse effect of unspecified drugs, medicaments and biological substances, initial encounter: Secondary | ICD-10-CM | POA: Insufficient documentation

## 2015-11-13 DIAGNOSIS — R7989 Other specified abnormal findings of blood chemistry: Secondary | ICD-10-CM | POA: Insufficient documentation

## 2015-11-13 NOTE — Assessment & Plan Note (Signed)
Patient encouraged to schedule appointment with Hand Surgeon using the number given by ER doctor as symptoms are still significant.

## 2015-11-13 NOTE — Assessment & Plan Note (Signed)
Noted on lab work at time of prostate biopsy. Will repeat BMP today.

## 2015-11-13 NOTE — Assessment & Plan Note (Signed)
Will stop the oxycodone due to itch. Will attempt trial of Ultram ER 300 mg daily. Follow-up 1 month.

## 2015-11-13 NOTE — Progress Notes (Signed)
Patient presents to clinic today c/o reaction to his pain medication. Patient currently on Oxy IR 15 mg up to TID as needed for significant OA pains. Patient has tolerated well until recent prostate biopsy and transurethral incision of bladder neck performed by Urology. Has noted significant itch each time he takes the medication. Denies rash or difficulty breathing.  Patient also recently in the ER for wrist injury. X-ray reveals dissociation of scapholunate in R wrist. Brace was applied and patient given number for hand surgeon. Has not called to schedule appointment. Endorses continued wrist pain although improved from time of visit. Denies numbness, tingling or swelling of R wrist.  Past Medical History  Diagnosis Date  . Hypertension   . GERD (gastroesophageal reflux disease)   . History of TB (tuberculosis)     1984--  hospitalized for 4 month treatment  . Post-polio syndrome     polio at age 3--PT WAS IN IRON LUNG; PT WAS IN W/C UNTIL AGE 37; STILL HAS WEAKNESS RIGHT SIDE  . History of chronic bronchitis   . History of rheumatic fever   . Bladder outlet obstruction   . Anxiety disorder   . History of urinary retention   . Nocturnal oxygen desaturation     USES O2 NIGHTLY  . Complication of anesthesia     DIFFICULT WAKING   . Diabetes mellitus without complication ([place])     BODERLINE - DIET CONTROL  . Anxiety   . Dysrhythmia     PVC'S  . Arthritis     BILATERAL SHOULDERS, ELBOWS AND HANDS AND LEFT HIP AND KNEES--HX CORTISONE SHOTS IN SHOULDERS, ELBOWS, HIP AND KNEES  . History of oxygen administration     oxygen use 2 l/m nasally at bedtime and exertional occasions  . COPD, frequent exacerbations ([place])     pulmologist-  dr Joya Gaskins--  [place] Rm Stage C.04-25-15 recent COPD exacerbation-much improved now, after tx. in ER [facility].  Marland Kitchen PONV (postoperative nausea and vomiting)   . Family history of adverse reaction to anesthesia     father would wake up with agitation   .  Shortness of breath dyspnea     RIGHT HEMIDIAPHRAGM ELEVATION - POST POLIO SYNDROME  . Pneumonia     hx of   . Hx of multiple concussions     x 2 per patient   . Depression   . Schizophrenia ([place])   . Prostate cancer ([place])   . Prostate cancer ([place])   . Melanoma [facility])     Current Outpatient Prescriptions on File Prior to Visit  Medication Sig Dispense Refill  . albuterol (PROVENTIL HFA;VENTOLIN HFA) 108 (90 Base) MCG/ACT inhaler Inhale 2 puffs into the lungs every 6 (six) hours as needed for wheezing or shortness of breath. 3 Inhaler 3  . ALPRAZolam (XANAX) 1 MG tablet Take 0.5 tablets (0.5 mg total) by mouth 3 (three) times daily as needed for anxiety. (Patient taking differently: Take 1 mg by mouth daily as needed for anxiety. ) 90 tablet 0  . amLODipine (NORVASC) 10 MG tablet Take 1 tablet (10 mg total) by mouth daily. 90 tablet 1  . citalopram (CELEXA) 20 MG tablet Take 1 tablet (20 mg total) by mouth daily. 90 tablet 1  . dexlansoprazole (DEXILANT) 60 MG capsule Take 1 capsule (60 mg total) by mouth daily. 90 capsule 1  . diphenhydrAMINE (SOMINEX) 25 MG tablet Take 25 mg by mouth daily as needed for itching or sleep.    . fluticasone (FLONASE)  50 MCG/ACT nasal spray Place 2 sprays into both nostrils daily. 16 g 6  . Fluticasone Furoate-Vilanterol (BREO ELLIPTA) 100-25 MCG/INH AEPB INHALE 1 PUFF INTO THE LUNGS EVERY MORNING. DAILY 180 each 0  . loratadine (CLARITIN) 10 MG tablet Take 10 mg by mouth daily.  5  . metoprolol succinate (TOPROL-XL) 25 MG 24 hr tablet Take 1 tablet (25 mg total) by mouth daily. 90 tablet 1  . montelukast (SINGULAIR) 10 MG tablet TAKE 1 TABLET (10 MG TOTAL) BY MOUTH AT BEDTIME. 90 tablet 1  . nitroGLYCERIN (NITROSTAT) 0.4 MG SL tablet Place 1 tablet (0.4 mg total) under the tongue every 5 (five) minutes as needed for chest pain. 30 tablet 3  . ondansetron (ZOFRAN ODT) 4 MG disintegrating tablet Take 1 tablet (4 mg total) by mouth every 8 (eight) hours as  needed for nausea. 20 tablet 0  . OXYGEN Inhale 2 L into the lungs at bedtime.    . promethazine (PHENERGAN) 25 MG tablet Take 25 mg by mouth every 6 (six) hours as needed for nausea or vomiting.    . silodosin (RAPAFLO) 8 MG CAPS capsule Take 8 mg by mouth daily with breakfast.    . tiotropium (SPIRIVA) 18 MCG inhalation capsule Place 1 capsule (18 mcg total) into inhaler and inhale daily. IN AM (Patient taking differently: Place 18 mcg into inhaler and inhale at bedtime. ) 90 capsule 1   No current facility-administered medications on file prior to visit.    Allergies  Allergen Reactions  . Ivp Dye [Iodinated Diagnostic Agents] Anaphylaxis  . Levaquin [Levofloxacin In D5w] Shortness Of Breath and Swelling    In addition: sweating, chest pain, and diarrhea.   . Penicillins Anaphylaxis    Heart stops Has patient had a PCN reaction causing immediate rash, facial/tongue/throat swelling, SOB or lightheadedness with hypotension: yes Has patient had a PCN reaction causing severe rash involving mucus membranes or skin necrosis: yes Has patient had a PCN reaction that required hospitalization yes Has patient had a PCN reaction occurring within the last 10 years: No If all of the above answers are "NO", then may proceed with Cephalosporin use.   . Aspirin Other (See Comments)    Reaction unknown  . Nsaids Other (See Comments)    Difficulty breathing  . Morphine And Related Nausea And Vomiting    Can take with zofran     Family History  Problem Relation Age of Onset  . Alzheimer's disease Father 4    Deceased  . Stomach cancer Father   . Heart attack Father   . Heart disease Father   . Skin cancer Mother     Facial-Living  . Alcohol abuse Sister     x2  . Mental illness Sister     x2  . Diabetes Maternal Aunt     x2  . Thyroid disease Maternal Aunt     x4  . Diabetes Maternal Uncle   . Tuberculosis Paternal Grandfather   . Tuberculosis Paternal Grandmother   . Alzheimer's  disease Paternal Aunt   . Alzheimer's disease Paternal Uncle   . Colon cancer Neg Hx   . Colon polyps Neg Hx   . Crohn's disease Neg Hx   . Ulcerative colitis Neg Hx     Social History   Social History  . Marital Status: Married    Spouse Name: N/A  . Number of Children: N/A  . Years of Education: N/A   Occupational History  . Retired  Social History Main Topics  . Smoking status: Former Smoker -- 32 years    Quit date: 08/25/2013  . Smokeless tobacco: Former Systems developer  . Alcohol Use: Yes     Comment: average 6 beers per week   . Drug Use: No     Comment: hx of marijuana use   . Sexual Activity: Not Asked   Other Topics Concern  . None   Social History Narrative    Review of Systems - See HPI.  All other ROS are negative.  BP 115/66 mmHg  Pulse 78  Temp(Src) 98 F (36.7 C) (Oral)  Ht 6' (1.829 m)  Wt 212 lb (96.163 kg)  BMI 28.75 kg/m2  SpO2 99%  Physical Exam  Constitutional: He is well-developed, well-nourished, and in no distress.  HENT:  Head: Normocephalic and atraumatic.  Eyes: Conjunctivae are normal.  Neck: Neck supple.  Cardiovascular: Normal rate, regular rhythm, normal heart sounds and intact distal pulses.   Pulmonary/Chest: Effort normal and breath sounds normal. No respiratory distress. He has no wheezes. He has no rales. He exhibits no tenderness.  Musculoskeletal:       Arms: Skin: Skin is warm and dry. No rash noted.  Psychiatric: Affect normal.  Vitals reviewed.   Recent Results (from the past 2160 hour(s))  CBC     Status: None   Collection Time: 09/02/15  5:30 PM  Result Value Ref Range   WBC 6.2 4.0 - 10.5 K/uL   RBC 4.80 4.22 - 5.81 MIL/uL   Hemoglobin 15.4 13.0 - 17.0 g/dL   HCT 45.1 39.0 - 52.0 %   MCV 94.0 78.0 - 100.0 fL   MCH 32.1 26.0 - 34.0 pg   MCHC 34.1 30.0 - 36.0 g/dL   RDW 12.8 11.5 - 15.5 %   Platelets 259 150 - 400 K/uL  Basic metabolic panel     Status: None   Collection Time: 09/02/15  5:30 PM  Result Value  Ref Range   Sodium 135 135 - 145 mmol/L   Potassium 3.9 3.5 - 5.1 mmol/L   Chloride 102 101 - 111 mmol/L   CO2 24 22 - 32 mmol/L   Glucose, Bld 92 65 - 99 mg/dL   BUN 19 6 - 20 mg/dL   Creatinine, Ser 1.22 0.61 - 1.24 mg/dL   Calcium 9.7 8.9 - 10.3 mg/dL   GFR calc non Af Amer >60 >60 mL/min   GFR calc Af Amer >60 >60 mL/min    Comment: (NOTE) The eGFR has been calculated using the CKD EPI equation. This calculation has not been validated in all clinical situations. eGFR's persistently <60 mL/min signify possible Chronic Kidney Disease.    Anion gap 9 5 - 15  Troponin I     Status: None   Collection Time: 09/02/15  5:30 PM  Result Value Ref Range   Troponin I <0.03 <0.031 ng/mL    Comment:        NO INDICATION OF MYOCARDIAL INJURY.   5 HIAA, quantitative, Urine, 24 hour     Status: None   Collection Time: 09/06/15  9:47 AM  Result Value Ref Range   5-HIAA, 24 Hr Urine 1.2 <=6.0 mg/24 h  Catecholamines, fractionated, urine, 24 hour     Status: None   Collection Time: 09/06/15  9:47 AM  Result Value Ref Range   Total Volume - CF 24Hr U 1200 mL   Epinephrine, 24 hr Urine 3 2 - 24 mcg/24 h   Norepinephrine, 24  hr Ur 26 15 - 100 mcg/24 h   Calculated Total (E+NE) 29 26 - 121 mcg/24 h   Dopamine, 24 hr Urine 141 52 - 480 mcg/24 h   Creatinine, Urine mg/day-CATEUR 1.39 0.63 - 2.50 g/24 h  CBC     Status: None   Collection Time: 09/06/15  3:46 PM  Result Value Ref Range   WBC 5.5 4.0 - 10.5 K/uL   RBC 4.61 4.22 - 5.81 Mil/uL   Platelets 240.0 150.0 - 400.0 K/uL   Hemoglobin 14.7 13.0 - 17.0 g/dL   HCT 43.7 39.0 - 52.0 %   MCV 94.8 78.0 - 100.0 fl   MCHC 33.6 30.0 - 36.0 g/dL   RDW 13.3 11.5 - 15.5 %  Comp Met (CMET)     Status: Abnormal   Collection Time: 09/06/15  3:46 PM  Result Value Ref Range   Sodium 138 135 - 145 mEq/L   Potassium 4.1 3.5 - 5.1 mEq/L   Chloride 102 96 - 112 mEq/L   CO2 27 19 - 32 mEq/L   Glucose, Bld 114 (H) 70 - 99 mg/dL   BUN 14 6 - 23 mg/dL     Creatinine, Ser 1.37 0.40 - 1.50 mg/dL   Total Bilirubin 0.5 0.2 - 1.2 mg/dL   Alkaline Phosphatase 60 39 - 117 U/L   AST 28 0 - 37 U/L   ALT 40 0 - 53 U/L   Total Protein 7.3 6.0 - 8.3 g/dL   Albumin 4.2 3.5 - 5.2 g/dL   Calcium 10.0 8.4 - 10.5 mg/dL   GFR 55.29 (L) >60.00 mL/min  CBC with Differential/Platelet     Status: None   Collection Time: 09/28/15  7:56 AM  Result Value Ref Range   WBC 7.7 4.0 - 10.5 K/uL   RBC 4.44 4.22 - 5.81 MIL/uL   Hemoglobin 14.0 13.0 - 17.0 g/dL   HCT 42.1 39.0 - 52.0 %   MCV 94.8 78.0 - 100.0 fL   MCH 31.5 26.0 - 34.0 pg   MCHC 33.3 30.0 - 36.0 g/dL   RDW 12.6 11.5 - 15.5 %   Platelets 309 150 - 400 K/uL   Neutrophils Relative % 76 %   Neutro Abs 5.8 1.7 - 7.7 K/uL   Lymphocytes Relative 12 %   Lymphs Abs 0.9 0.7 - 4.0 K/uL   Monocytes Relative 9 %   Monocytes Absolute 0.7 0.1 - 1.0 K/uL   Eosinophils Relative 3 %   Eosinophils Absolute 0.2 0.0 - 0.7 K/uL   Basophils Relative 0 %   Basophils Absolute 0.0 0.0 - 0.1 K/uL  Basic metabolic panel     Status: Abnormal   Collection Time: 09/28/15  7:56 AM  Result Value Ref Range   Sodium 134 (L) 135 - 145 mmol/L   Potassium 4.2 3.5 - 5.1 mmol/L   Chloride 101 101 - 111 mmol/L   CO2 26 22 - 32 mmol/L   Glucose, Bld 147 (H) 65 - 99 mg/dL   BUN 10 6 - 20 mg/dL   Creatinine, Ser 1.10 0.61 - 1.24 mg/dL   Calcium 8.9 8.9 - 10.3 mg/dL   GFR calc non Af Amer >60 >60 mL/min   GFR calc Af Amer >60 >60 mL/min    Comment: (NOTE) The eGFR has been calculated using the CKD EPI equation. This calculation has not been validated in all clinical situations. eGFR's persistently <60 mL/min signify possible Chronic Kidney Disease.    Anion gap 7 5 -  15  I-Stat CG4 Lactic Acid, ED     Status: None   Collection Time: 09/28/15  8:10 AM  Result Value Ref Range   Lactic Acid, Venous 1.49 0.5 - 2.0 mmol/L  Urinalysis, Routine w reflex microscopic (not at [facility])     Status: Abnormal   Collection Time: 09/28/15   9:02 AM  Result Value Ref Range   Color, Urine YELLOW YELLOW   APPearance CLOUDY (A) CLEAR   Specific Gravity, Urine 1.013 1.005 - 1.030   pH 5.5 5.0 - 8.0   Glucose, UA NEGATIVE NEGATIVE mg/dL   Hgb urine dipstick NEGATIVE NEGATIVE   Bilirubin Urine NEGATIVE NEGATIVE   Ketones, ur NEGATIVE NEGATIVE mg/dL   Protein, ur NEGATIVE NEGATIVE mg/dL   Nitrite NEGATIVE NEGATIVE   Leukocytes, UA NEGATIVE NEGATIVE    Comment: MICROSCOPIC NOT DONE ON URINES WITH NEGATIVE PROTEIN, BLOOD, LEUKOCYTES, NITRITE, OR GLUCOSE <1000 mg/dL.  Urine culture     Status: None   Collection Time: 09/28/15  9:02 AM  Result Value Ref Range   Specimen Description URINE, CLEAN CATCH    Special Requests NONE    Culture      10,000 COLONIES/mL STAPHYLOCOCCUS SPECIES (COAGULASE NEGATIVE) Performed at [facility]    Report Status 09/30/2015 FINAL    Organism ID, Bacteria STAPHYLOCOCCUS SPECIES (COAGULASE NEGATIVE)       Susceptibility   Staphylococcus species (coagulase negative) - MIC*    CIPROFLOXACIN <=0.5 SENSITIVE Sensitive     GENTAMICIN <=0.5 SENSITIVE Sensitive     NITROFURANTOIN <=16 SENSITIVE Sensitive     OXACILLIN >=4 RESISTANT Resistant     TETRACYCLINE <=1 SENSITIVE Sensitive     VANCOMYCIN <=0.5 SENSITIVE Sensitive     TRIMETH/SULFA <=10 SENSITIVE Sensitive     CLINDAMYCIN <=0.25 SENSITIVE Sensitive     RIFAMPIN <=0.5 SENSITIVE Sensitive     Inducible Clindamycin NEGATIVE Sensitive     * 10,000 COLONIES/mL STAPHYLOCOCCUS SPECIES (COAGULASE NEGATIVE)  CBC with Differential/Platelet     Status: Abnormal   Collection Time: 10/05/15  1:04 AM  Result Value Ref Range   WBC 4.7 4.0 - 10.5 K/uL   RBC 4.13 (L) 4.22 - 5.81 MIL/uL   Hemoglobin 13.2 13.0 - 17.0 g/dL   HCT 39.2 39.0 - 52.0 %   MCV 94.9 78.0 - 100.0 fL   MCH 32.0 26.0 - 34.0 pg   MCHC 33.7 30.0 - 36.0 g/dL   RDW 12.6 11.5 - 15.5 %   Platelets 290 150 - 400 K/uL   Neutrophils Relative % 48 %   Neutro Abs 2.3 1.7 - 7.7 K/uL    Lymphocytes Relative 33 %   Lymphs Abs 1.5 0.7 - 4.0 K/uL   Monocytes Relative 9 %   Monocytes Absolute 0.4 0.1 - 1.0 K/uL   Eosinophils Relative 9 %   Eosinophils Absolute 0.4 0.0 - 0.7 K/uL   Basophils Relative 1 %   Basophils Absolute 0.0 0.0 - 0.1 K/uL  Comprehensive metabolic panel     Status: Abnormal   Collection Time: 10/05/15  1:04 AM  Result Value Ref Range   Sodium 139 135 - 145 mmol/L   Potassium 4.8 3.5 - 5.1 mmol/L   Chloride 108 101 - 111 mmol/L   CO2 24 22 - 32 mmol/L   Glucose, Bld 109 (H) 65 - 99 mg/dL   BUN 19 6 - 20 mg/dL   Creatinine, Ser 1.26 (H) 0.61 - 1.24 mg/dL   Calcium 9.5 8.9 - 10.3 mg/dL  Total Protein 7.1 6.5 - 8.1 g/dL   Albumin 4.0 3.5 - 5.0 g/dL   AST 28 15 - 41 U/L   ALT 35 17 - 63 U/L   Alkaline Phosphatase 58 38 - 126 U/L   Total Bilirubin 0.5 0.3 - 1.2 mg/dL   GFR calc non Af Amer 58 (L) >60 mL/min   GFR calc Af Amer >60 >60 mL/min    Comment: (NOTE) The eGFR has been calculated using the CKD EPI equation. This calculation has not been validated in all clinical situations. eGFR's persistently <60 mL/min signify possible Chronic Kidney Disease.    Anion gap 7 5 - 15  Lipase, blood     Status: None   Collection Time: 10/05/15  1:04 AM  Result Value Ref Range   Lipase 46 11 - 51 U/L  Urine culture     Status: None   Collection Time: 10/05/15  1:55 AM  Result Value Ref Range   Specimen Description URINE, CLEAN CATCH    Special Requests NONE    Culture      1,000 COLONIES/mL INSIGNIFICANT GROWTH Performed at [facility]    Report Status 10/06/2015 FINAL   Urinalysis, Routine w reflex microscopic (not at [facility])     Status: None   Collection Time: 10/05/15  1:55 AM  Result Value Ref Range   Color, Urine YELLOW YELLOW   APPearance CLEAR CLEAR   Specific Gravity, Urine 1.019 1.005 - 1.030   pH 5.5 5.0 - 8.0   Glucose, UA NEGATIVE NEGATIVE mg/dL   Hgb urine dipstick NEGATIVE NEGATIVE   Bilirubin Urine NEGATIVE NEGATIVE    Ketones, ur NEGATIVE NEGATIVE mg/dL   Protein, ur NEGATIVE NEGATIVE mg/dL   Nitrite NEGATIVE NEGATIVE   Leukocytes, UA NEGATIVE NEGATIVE    Comment: MICROSCOPIC NOT DONE ON URINES WITH NEGATIVE PROTEIN, BLOOD, LEUKOCYTES, NITRITE, OR GLUCOSE <1000 mg/dL.  Glucose, capillary     Status: Abnormal   Collection Time: 10/25/15  7:45 AM  Result Value Ref Range   Glucose-Capillary 105 (H) 65 - 99 mg/dL   Comment 1 Notify RN   Basic metabolic panel     Status: Abnormal   Collection Time: 10/25/15  8:00 AM  Result Value Ref Range   Sodium 138 135 - 145 mmol/L   Potassium 4.5 3.5 - 5.1 mmol/L   Chloride 105 101 - 111 mmol/L   CO2 24 22 - 32 mmol/L   Glucose, Bld 129 (H) 65 - 99 mg/dL   BUN 13 6 - 20 mg/dL   Creatinine, Ser 1.15 0.61 - 1.24 mg/dL   Calcium 9.7 8.9 - 10.3 mg/dL   GFR calc non Af Amer >60 >60 mL/min   GFR calc Af Amer >60 >60 mL/min    Comment: (NOTE) The eGFR has been calculated using the CKD EPI equation. This calculation has not been validated in all clinical situations. eGFR's persistently <60 mL/min signify possible Chronic Kidney Disease.    Anion gap 9 5 - 15  CBC     Status: None   Collection Time: 10/25/15  8:00 AM  Result Value Ref Range   WBC 6.7 4.0 - 10.5 K/uL   RBC 4.86 4.22 - 5.81 MIL/uL   Hemoglobin 15.5 13.0 - 17.0 g/dL   HCT 44.1 39.0 - 52.0 %   MCV 90.7 78.0 - 100.0 fL   MCH 31.9 26.0 - 34.0 pg   MCHC 35.1 30.0 - 36.0 g/dL   RDW 12.9 11.5 - 15.5 %  Platelets 249 150 - 400 K/uL  Glucose, capillary     Status: Abnormal   Collection Time: 10/25/15 10:51 AM  Result Value Ref Range   Glucose-Capillary 158 (H) 65 - 99 mg/dL  Comprehensive metabolic panel     Status: Abnormal   Collection Time: 10/26/15  6:45 PM  Result Value Ref Range   Sodium 137 135 - 145 mmol/L   Potassium 4.1 3.5 - 5.1 mmol/L   Chloride 101 101 - 111 mmol/L   CO2 27 22 - 32 mmol/L   Glucose, Bld 120 (H) 65 - 99 mg/dL   BUN 18 6 - 20 mg/dL   Creatinine, Ser 1.66 (H) 0.61 -  1.24 mg/dL   Calcium 9.0 8.9 - 10.3 mg/dL   Total Protein 7.6 6.5 - 8.1 g/dL   Albumin 4.4 3.5 - 5.0 g/dL   AST 24 15 - 41 U/L   ALT 31 17 - 63 U/L   Alkaline Phosphatase 58 38 - 126 U/L   Total Bilirubin 0.9 0.3 - 1.2 mg/dL   GFR calc non Af Amer 42 (L) >60 mL/min   GFR calc Af Amer 48 (L) >60 mL/min    Comment: (NOTE) The eGFR has been calculated using the CKD EPI equation. This calculation has not been validated in all clinical situations. eGFR's persistently <60 mL/min signify possible Chronic Kidney Disease.    Anion gap 9 5 - 15  CBC     Status: None   Collection Time: 10/26/15  6:45 PM  Result Value Ref Range   WBC 8.1 4.0 - 10.5 K/uL   RBC 4.31 4.22 - 5.81 MIL/uL   Hemoglobin 13.4 13.0 - 17.0 g/dL   HCT 41.3 39.0 - 52.0 %   MCV 95.8 78.0 - 100.0 fL   MCH 31.1 26.0 - 34.0 pg   MCHC 32.4 30.0 - 36.0 g/dL   RDW 13.2 11.5 - 15.5 %   Platelets 222 150 - 400 K/uL  Troponin I     Status: None   Collection Time: 10/26/15  6:45 PM  Result Value Ref Range   Troponin I <0.03 <0.031 ng/mL    Comment:        NO INDICATION OF MYOCARDIAL INJURY.   Basic Metabolic Panel (BMET)     Status: None   Collection Time: 11/09/15 11:45 AM  Result Value Ref Range   Sodium 137 135 - 145 mEq/L   Potassium 4.3 3.5 - 5.1 mEq/L   Chloride 103 96 - 112 mEq/L   CO2 28 19 - 32 mEq/L   Glucose, Bld 99 70 - 99 mg/dL   BUN 13 6 - 23 mg/dL   Creatinine, Ser 1.12 0.40 - 1.50 mg/dL   Calcium 9.1 8.4 - 10.5 mg/dL   GFR 69.72 >60.00 mL/min    Assessment/Plan: Elevated serum creatinine Noted on lab work at time of prostate biopsy. Will repeat BMP today.  Medication reaction Will stop the oxycodone due to itch. Will attempt trial of Ultram ER 300 mg daily. Follow-up 1 month.  Scapholunate dissociation of right wrist Patient encouraged to schedule appointment with Hand Surgeon using the number given by ER doctor as symptoms are still significant.

## 2015-11-14 ENCOUNTER — Other Ambulatory Visit: Payer: Self-pay | Admitting: *Deleted

## 2015-11-14 DIAGNOSIS — K219 Gastro-esophageal reflux disease without esophagitis: Secondary | ICD-10-CM

## 2015-11-14 MED ORDER — DEXLANSOPRAZOLE 60 MG PO CPDR: 60.0000 mg | DELAYED_RELEASE_CAPSULE | Freq: Every day | ORAL | Status: DC

## 2015-11-15 ENCOUNTER — Telehealth: Payer: Self-pay | Admitting: Pulmonary Disease

## 2015-11-15 NOTE — Telephone Encounter (Signed)
Sleep Study Results  - 11/09/2015  Per RA:  AHI: 8.5/hr Lowest Sat - 79%  Mild Sleep apnea Needs OV to discuss

## 2015-11-15 NOTE — Telephone Encounter (Signed)
Spoke with pt, aware of results and recs.  Scheduled ov with TP to discuss results/recs.  Nothing further needed.

## 2015-11-15 NOTE — Telephone Encounter (Signed)
lmtcb x1 for pt. 

## 2015-11-16 ENCOUNTER — Telehealth: Payer: Self-pay | Admitting: Physician Assistant

## 2015-11-16 ENCOUNTER — Ambulatory Visit: Payer: Self-pay | Admitting: Physician Assistant

## 2015-11-16 DIAGNOSIS — G4733 Obstructive sleep apnea (adult) (pediatric): Secondary | ICD-10-CM | POA: Diagnosis not present

## 2015-11-16 NOTE — Telephone Encounter (Signed)
Ok thank you 

## 2015-11-16 NOTE — Telephone Encounter (Signed)
Pt lvm at 10:56 stating that he isn't feeling well and need to reschedule his appt. Called pt back to reschedule. No answer. Lvm advising pt to call our office back to schedule.    Thanks.

## 2015-11-17 ENCOUNTER — Other Ambulatory Visit: Payer: Self-pay | Admitting: *Deleted

## 2015-11-17 DIAGNOSIS — G4733 Obstructive sleep apnea (adult) (pediatric): Secondary | ICD-10-CM

## 2015-11-17 NOTE — Telephone Encounter (Signed)
Charge or no charge? °

## 2015-11-17 NOTE — Telephone Encounter (Signed)
No charge. 

## 2015-11-21 ENCOUNTER — Telehealth: Payer: Self-pay | Admitting: Physician Assistant

## 2015-11-21 ENCOUNTER — Encounter (HOSPITAL_BASED_OUTPATIENT_CLINIC_OR_DEPARTMENT_OTHER): Payer: Self-pay

## 2015-11-21 ENCOUNTER — Emergency Department (HOSPITAL_BASED_OUTPATIENT_CLINIC_OR_DEPARTMENT_OTHER): Payer: Medicare Other

## 2015-11-21 ENCOUNTER — Inpatient Hospital Stay (HOSPITAL_BASED_OUTPATIENT_CLINIC_OR_DEPARTMENT_OTHER)
Admission: EM | Admit: 2015-11-21 | Discharge: 2015-11-23 | DRG: 897 | Disposition: A | Discharge status: Home / Self Care | Payer: Medicare Other | Attending: Internal Medicine | Admitting: Internal Medicine

## 2015-11-21 ENCOUNTER — Other Ambulatory Visit: Payer: Self-pay | Admitting: Physician Assistant

## 2015-11-21 ENCOUNTER — Ambulatory Visit: Payer: Self-pay | Admitting: Adult Health

## 2015-11-21 DIAGNOSIS — F1020 Alcohol dependence, uncomplicated: Secondary | ICD-10-CM | POA: Diagnosis not present

## 2015-11-21 DIAGNOSIS — Z885 Allergy status to narcotic agent status: Secondary | ICD-10-CM

## 2015-11-21 DIAGNOSIS — F10939 Alcohol use, unspecified with withdrawal, unspecified: Secondary | ICD-10-CM

## 2015-11-21 DIAGNOSIS — J441 Chronic obstructive pulmonary disease with (acute) exacerbation: Secondary | ICD-10-CM | POA: Diagnosis present

## 2015-11-21 DIAGNOSIS — G14 Postpolio syndrome: Secondary | ICD-10-CM | POA: Diagnosis present

## 2015-11-21 DIAGNOSIS — F419 Anxiety disorder, unspecified: Secondary | ICD-10-CM | POA: Diagnosis present

## 2015-11-21 DIAGNOSIS — Z880 Allergy status to penicillin: Secondary | ICD-10-CM

## 2015-11-21 DIAGNOSIS — F10239 Alcohol dependence with withdrawal, unspecified: Secondary | ICD-10-CM

## 2015-11-21 DIAGNOSIS — F1024 Alcohol dependence with alcohol-induced mood disorder: Secondary | ICD-10-CM | POA: Diagnosis present

## 2015-11-21 DIAGNOSIS — F329 Major depressive disorder, single episode, unspecified: Secondary | ICD-10-CM | POA: Diagnosis present

## 2015-11-21 DIAGNOSIS — R0602 Shortness of breath: Secondary | ICD-10-CM | POA: Diagnosis not present

## 2015-11-21 DIAGNOSIS — Z9119 Patient's noncompliance with other medical treatment and regimen: Secondary | ICD-10-CM | POA: Diagnosis not present

## 2015-11-21 DIAGNOSIS — Z886 Allergy status to analgesic agent status: Secondary | ICD-10-CM | POA: Diagnosis not present

## 2015-11-21 DIAGNOSIS — G4733 Obstructive sleep apnea (adult) (pediatric): Secondary | ICD-10-CM | POA: Diagnosis present

## 2015-11-21 DIAGNOSIS — Z87891 Personal history of nicotine dependence: Secondary | ICD-10-CM | POA: Diagnosis not present

## 2015-11-21 DIAGNOSIS — Z888 Allergy status to other drugs, medicaments and biological substances status: Secondary | ICD-10-CM | POA: Diagnosis not present

## 2015-11-21 DIAGNOSIS — Z800 Family history of malignant neoplasm of digestive organs: Secondary | ICD-10-CM

## 2015-11-21 DIAGNOSIS — Z881 Allergy status to other antibiotic agents status: Secondary | ICD-10-CM | POA: Diagnosis not present

## 2015-11-21 DIAGNOSIS — Z8611 Personal history of tuberculosis: Secondary | ICD-10-CM

## 2015-11-21 DIAGNOSIS — F10229 Alcohol dependence with intoxication, unspecified: Secondary | ICD-10-CM | POA: Diagnosis present

## 2015-11-21 DIAGNOSIS — K2920 Alcoholic gastritis without bleeding: Secondary | ICD-10-CM | POA: Diagnosis present

## 2015-11-21 DIAGNOSIS — F10231 Alcohol dependence with withdrawal delirium: Secondary | ICD-10-CM | POA: Diagnosis present

## 2015-11-21 DIAGNOSIS — Z79899 Other long term (current) drug therapy: Secondary | ICD-10-CM | POA: Diagnosis not present

## 2015-11-21 DIAGNOSIS — K219 Gastro-esophageal reflux disease without esophagitis: Secondary | ICD-10-CM | POA: Diagnosis present

## 2015-11-21 DIAGNOSIS — R45851 Suicidal ideations: Secondary | ICD-10-CM | POA: Diagnosis present

## 2015-11-21 DIAGNOSIS — Z91041 Radiographic dye allergy status: Secondary | ICD-10-CM | POA: Diagnosis not present

## 2015-11-21 DIAGNOSIS — F1014 Alcohol abuse with alcohol-induced mood disorder: Secondary | ICD-10-CM | POA: Diagnosis not present

## 2015-11-21 DIAGNOSIS — I10 Essential (primary) hypertension: Secondary | ICD-10-CM | POA: Diagnosis present

## 2015-11-21 DIAGNOSIS — Z8249 Family history of ischemic heart disease and other diseases of the circulatory system: Secondary | ICD-10-CM

## 2015-11-21 DIAGNOSIS — F10931 Alcohol use, unspecified with withdrawal delirium: Secondary | ICD-10-CM | POA: Diagnosis present

## 2015-11-21 LAB — BASIC METABOLIC PANEL
ANION GAP: 13 (ref 5–15)
BUN: 15 mg/dL (ref 6–20)
CALCIUM: 8.8 mg/dL — AB (ref 8.9–10.3)
CHLORIDE: 99 mmol/L — AB (ref 101–111)
CO2: 18 mmol/L — AB (ref 22–32)
Creatinine, Ser: 1.19 mg/dL (ref 0.61–1.24)
GFR calc non Af Amer: >60 mL/min (ref >60)
Glucose, Bld: 140 mg/dL — ABNORMAL HIGH (ref 65–99)
POTASSIUM: 4.4 mmol/L (ref 3.5–5.1)
Sodium: 130 mmol/L — ABNORMAL LOW (ref 135–145)

## 2015-11-21 LAB — CBC WITH DIFFERENTIAL/PLATELET
BASOS PCT: 1 %
Basophils Absolute: 0.1 10*3/uL (ref 0.0–0.1)
Eosinophils Absolute: 0.2 10*3/uL (ref 0.0–0.7)
Eosinophils Relative: 3 %
HEMATOCRIT: 40.6 % (ref 39.0–52.0)
HEMOGLOBIN: 13.6 g/dL (ref 13.0–17.0)
LYMPHS ABS: 1.3 10*3/uL (ref 0.7–4.0)
LYMPHS PCT: 16 %
MCH: 30.4 pg (ref 26.0–34.0)
MCHC: 33.5 g/dL (ref 30.0–36.0)
MCV: 90.6 fL (ref 78.0–100.0)
MONOS PCT: 4 %
Monocytes Absolute: 0.3 10*3/uL (ref 0.1–1.0)
NEUTROS ABS: 6.2 10*3/uL (ref 1.7–7.7)
NEUTROS PCT: 76 %
Platelets: 268 10*3/uL (ref 150–400)
RBC: 4.48 MIL/uL (ref 4.22–5.81)
RDW: 13.1 % (ref 11.5–15.5)
WBC: 8.1 10*3/uL (ref 4.0–10.5)

## 2015-11-21 LAB — ETHANOL: Alcohol, Ethyl (B): 225 mg/dL — ABNORMAL HIGH (ref <5)

## 2015-11-21 MED ORDER — LORAZEPAM 2 MG/ML IJ SOLN: 1.0000 mg | INTRAMUSCULAR | Status: DC

## 2015-11-21 MED ORDER — LORAZEPAM 2 MG/ML IJ SOLN
INTRAMUSCULAR | Status: DC
Start: 2015-11-21 — End: 2015-11-22
  Filled 2015-11-21: qty 1

## 2015-11-21 MED ORDER — ONDANSETRON HCL 4 MG/2ML IJ SOLN
4.0000 mg | Freq: Three times a day (TID) | INTRAMUSCULAR | Status: AC | PRN
  Administered 2015-11-22: 4 mg via INTRAVENOUS
  Filled 2015-11-21: qty 2

## 2015-11-21 MED ORDER — HALOPERIDOL LACTATE 5 MG/ML IJ SOLN
1.0000 mg | Freq: Once | INTRAMUSCULAR | Status: AC
  Administered 2015-11-21: 1 mg via INTRAVENOUS

## 2015-11-21 MED ORDER — HALOPERIDOL LACTATE 5 MG/ML IJ SOLN
INTRAMUSCULAR | Status: AC
  Filled 2015-11-21: qty 1

## 2015-11-21 MED ORDER — LEVALBUTEROL HCL 0.63 MG/3ML IN NEBU
INHALATION_SOLUTION | RESPIRATORY_TRACT | Status: AC
  Filled 2015-11-21: qty 3

## 2015-11-21 MED ORDER — LORAZEPAM 2 MG/ML IJ SOLN
2.0000 mg | Freq: Once | INTRAMUSCULAR | Status: AC
  Administered 2015-11-21: 2 mg via INTRAVENOUS
  Filled 2015-11-21: qty 1

## 2015-11-21 MED ORDER — LEVALBUTEROL HCL 0.63 MG/3ML IN NEBU
0.6300 mg | INHALATION_SOLUTION | Freq: Once | RESPIRATORY_TRACT | Status: AC
  Administered 2015-11-21: 0.63 mg via RESPIRATORY_TRACT
  Filled 2015-11-21: qty 3

## 2015-11-21 MED ORDER — POLYETHYLENE GLYCOL 3350 17 G PO PACK: 17.0000 g | PACK | Freq: Two times a day (BID) | ORAL | Status: DC

## 2015-11-21 MED ORDER — LORAZEPAM 2 MG/ML IJ SOLN
1.0000 mg | Freq: Once | INTRAMUSCULAR | Status: AC
  Administered 2015-11-21: 1 mg via INTRAVENOUS

## 2015-11-21 NOTE — Discharge Instructions (Signed)

## 2015-11-21 NOTE — Plan of Care (Signed)
65 year old male presents with COPD exacerbation. Patient also was found to be in early alcohol withdrawal. Patient also endorsed suicidal ideations.  [name].

## 2015-11-21 NOTE — ED Provider Notes (Addendum)
CSN: [id number]     Arrival date & time 11/21/15  1830 History  By signing my name below, I, [name], attest that this documentation has been prepared under the direction and in the presence of Leonard Schwartz, MD. Electronically Signed: [name], ED Scribe. 11/21/2015. 7:12 PM.   Chief Complaint  Patient presents with  . Shortness of Breath   The history is provided by the patient. No language interpreter was used.     HPI Comments: [name] is a 65 y.o. male with hx anxiety who presents to the Emergency Department complaining of gradual onset, constant, shortness of breath x 2 weeks. He also complains of a dry mouth sensation which was not relieved with Biotin. Pt has 47 year history of heavy drinking and states he has been attempting to ween himself off of EtOH recently. He usually drinks a 12 pack of beer per day but had not had any drink in the past 2 weeks until yesterady. He mentions drinking #2 24 ounces of beer yesterday. The last time he drank was 2 PM today in an attempt to relieve his dry mouth. Pt spoke with his PCP, [name], today and was told he may be going into DTs. Wife mentions that pt recently began taking Dexilant for his GERD. Denies any other associated symptoms.   Past Medical History  Diagnosis Date  . Hypertension   . GERD (gastroesophageal reflux disease)   . History of TB (tuberculosis)     1984--  hospitalized for 4 month treatment  . Post-polio syndrome     polio at age 79--PT WAS IN IRON LUNG; PT WAS IN W/C UNTIL AGE 24; STILL HAS WEAKNESS RIGHT SIDE  . History of chronic bronchitis   . History of rheumatic fever   . Bladder outlet obstruction   . Anxiety disorder   . History of urinary retention   . Nocturnal oxygen desaturation     USES O2 NIGHTLY  . Complication of anesthesia     DIFFICULT WAKING   . Diabetes mellitus without complication ([place])     BODERLINE - DIET CONTROL  . Anxiety   . Dysrhythmia     PVC'S  . Arthritis      BILATERAL SHOULDERS, ELBOWS AND HANDS AND LEFT HIP AND KNEES--HX CORTISONE SHOTS IN SHOULDERS, ELBOWS, HIP AND KNEES  . History of oxygen administration     oxygen use 2 l/m nasally at bedtime and exertional occasions  . COPD, frequent exacerbations ([place])     pulmologist-  dr Joya Gaskins--  [place] Rm Stage C.04-25-15 recent COPD exacerbation-much improved now, after tx. in ER [facility].  Marland Kitchen PONV (postoperative nausea and vomiting)   . Family history of adverse reaction to anesthesia     father would wake up with agitation   . Shortness of breath dyspnea     RIGHT HEMIDIAPHRAGM ELEVATION - POST POLIO SYNDROME  . Pneumonia     hx of   . Hx of multiple concussions     x 2 per patient   . Depression   . Schizophrenia ([place])   . Prostate cancer ([place])   . Prostate cancer ([place])   . Melanoma [facility])    Past Surgical History  Procedure Laterality Date  . Other surgical history       Muscle & bone Graft/Polio  . Cystoscopy w/ cystogram/  transrectal ultrasound prostate bx  03-22-2009  . Cardiovascular stress test  06-08-2014  dr Mare Ferrari    normal lexiscan study/  no  ischemia/  not gated due to PAC's  . Ureterosopy stone extraction  2000  . Nasal septum surgery  2000  . Shoulder arthroscopy with open rotator cuff repair Bilateral 2013  &  1999    removal spurs and bursectomy  . Laparoscopic cholecystectomy  2013  . Prostate biopsy N/A 09/28/2014    Procedure: PROSTATE ULTRASOUND/BIOPSY;  Surgeon: [name], MD;  Location: [facility];  Service: Urology;  Laterality: N/A;  . Transurethral resection of prostate N/A 09/28/2014    Procedure: TRANSURETHRAL RESECTION OF THE PROSTATE (TURP);  Surgeon: [name], MD;  Location: [facility];  Service: Urology;  Laterality: N/A;  . Esophagogastroduodenoscopy N/A 03/22/2015    Procedure: ESOPHAGOGASTRODUODENOSCOPY (EGD) with dilation;  Surgeon: [name], MD;  Location: [facility];  Service: Endoscopy;  Laterality: N/A;  . Savory dilation N/A 03/22/2015     Procedure: SAVORY DILATION;  Surgeon: [name], MD;  Location: [facility];  Service: Endoscopy;  Laterality: N/A;  . Colonoscopy N/A 05/03/2015    Procedure: COLONOSCOPY;  Surgeon: [name], MD;  Location: [facility];  Service: Endoscopy;  Laterality: N/A;  . Polio surgeries       14 polio surgeries   . Left elbow surgery       due to fracture   . Prostate biopsy N/A 10/25/2015    Procedure: PROSTATE BIOPSY AND ULTRASOUND;  Surgeon: [name], MD;  Location: [facility];  Service: Urology;  Laterality: N/A;  . Transurethral incision of bladder neck N/A 10/25/2015    Procedure:  TRANSURETHRAL INCISION OF BLADDER NECK;  Surgeon: [name], MD;  Location: [facility];  Service: Urology;  Laterality: N/A;  . Cystoscopy N/A 10/25/2015    Procedure: CYSTOSCOPY;  Surgeon: [name], MD;  Location: [facility];  Service: Urology;  Laterality: N/A;   Family History  Problem Relation Age of Onset  . Alzheimer's disease Father 4    Deceased  . Stomach cancer Father   . Heart attack Father   . Heart disease Father   . Skin cancer Mother     Facial-Living  . Alcohol abuse Sister     x2  . Mental illness Sister     x2  . Diabetes Maternal Aunt     x2  . Thyroid disease Maternal Aunt     x4  . Diabetes Maternal Uncle   . Tuberculosis Paternal Grandfather   . Tuberculosis Paternal Grandmother   . Alzheimer's disease Paternal Aunt   . Alzheimer's disease Paternal Uncle   . Colon cancer Neg Hx   . Colon polyps Neg Hx   . Crohn's disease Neg Hx   . Ulcerative colitis Neg Hx    Social History  Substance Use Topics  . Smoking status: Former Smoker -- 78 years    Quit date: 08/25/2013  . Smokeless tobacco: Former Systems developer  . Alcohol Use: Yes     Comment: daily    Review of Systems  A complete 10 system review of systems was obtained and all systems are negative except as noted in the HPI and PMH.   Allergies  Ivp dye; Levaquin; Penicillins; Aspirin; Nsaids; and Morphine and related  Home  Medications   Prior to Admission medications   Medication Sig Start Date End Date Taking? Authorizing Provider  albuterol (PROVENTIL HFA;VENTOLIN HFA) 108 (90 Base) MCG/ACT inhaler Inhale 2 puffs into the lungs every 6 (six) hours as needed for wheezing or shortness of breath. 10/20/15   Brunetta Jeans, PA-C  ALPRAZolam (XANAX) 1 MG tablet Take 0.5 tablets (0.5 mg total) by mouth 3 (three) times daily as needed for anxiety. Patient taking differently: Take 1 mg by mouth daily as needed for anxiety.  09/06/15   Brunetta Jeans, PA-C  amLODipine (NORVASC) 10 MG tablet Take 1 tablet (10 mg total) by mouth daily. 10/19/15   Brunetta Jeans, PA-C  citalopram (CELEXA) 20 MG tablet Take 1 tablet (20 mg total) by mouth daily. 10/20/15   Brunetta Jeans, PA-C  dexlansoprazole (DEXILANT) 60 MG capsule Take 1 capsule (60 mg total) by mouth daily. 11/14/15   Brunetta Jeans, PA-C  diphenhydrAMINE (SOMINEX) 25 MG tablet Take 25 mg by mouth daily as needed for itching or sleep.    Historical Provider, MD  fluticasone (FLONASE) 50 MCG/ACT nasal spray Place 2 sprays into both nostrils daily. 08/03/15   Brunetta Jeans, PA-C  Fluticasone Furoate-Vilanterol (BREO ELLIPTA) 100-25 MCG/INH AEPB INHALE 1 PUFF INTO THE LUNGS EVERY MORNING. DAILY 10/25/15   Brunetta Jeans, PA-C  loratadine (CLARITIN) 10 MG tablet Take 10 mg by mouth daily. 08/01/15   Historical Provider, MD  metoprolol succinate (TOPROL-XL) 25 MG 24 hr tablet Take 1 tablet (25 mg total) by mouth daily. 10/20/15   Brunetta Jeans, PA-C  montelukast (SINGULAIR) 10 MG tablet TAKE 1 TABLET (10 MG TOTAL) BY MOUTH AT BEDTIME. 10/20/15   Brunetta Jeans, PA-C  nitroGLYCERIN (NITROSTAT) 0.4 MG SL tablet Place 1 tablet (0.4 mg total) under the tongue every 5 (five) minutes as needed for chest pain. 06/21/15   Brunetta Jeans, PA-C  ondansetron (ZOFRAN ODT) 4 MG disintegrating tablet Take 1 tablet (4 mg total) by mouth every 8 (eight) hours as needed for nausea.  09/28/15   Tanna Furry, MD  OXYGEN Inhale 2 L into the lungs at bedtime.    Historical Provider, MD  polyethylene glycol (MIRALAX / GLYCOLAX) packet Take 17 g by mouth 2 (two) times daily. 11/21/15   Leonard Schwartz, MD  promethazine (PHENERGAN) 25 MG tablet Take 25 mg by mouth every 6 (six) hours as needed for nausea or vomiting.    Historical Provider, MD  silodosin (RAPAFLO) 8 MG CAPS capsule Take 8 mg by mouth daily with breakfast.    Historical Provider, MD  tiotropium (SPIRIVA) 18 MCG inhalation capsule Place 1 capsule (18 mcg total) into inhaler and inhale daily. IN AM Patient taking differently: Place 18 mcg into inhaler and inhale at bedtime.  05/05/15   Brunetta Jeans, PA-C  traMADol (ULTRAM-ER) 300 MG 24 hr tablet Take 1 tablet (300 mg total) by mouth daily. 11/09/15   Brunetta Jeans, PA-C   BP 114/59 mmHg  Pulse 70  Temp(Src) 97.9 F (36.6 C) (Oral)  Resp 18  Ht 6' (1.829 m)  Wt 212 lb (96.163 kg)  BMI 28.75 kg/m2  SpO2 93%   Physical Exam  Constitutional: He is oriented to person, place, and time. He appears well-developed and well-nourished. No distress.  HENT:  Head: Normocephalic and atraumatic.  Eyes: Pupils are equal, round, and reactive to light.  Neck: Normal range of motion.  Cardiovascular: Normal rate and intact distal pulses.   Pulmonary/Chest: No respiratory distress.  Abdominal: Soft. Normal appearance and bowel sounds are normal. He exhibits no distension. There is no tenderness. There is no rebound and no guarding.  Musculoskeletal: Normal range of motion.  Neurological: He is alert and oriented to person, place, and time. No cranial nerve deficit.  Skin: Skin  is warm and dry. No rash noted.  Psychiatric: His speech is normal. He exhibits a depressed mood. He expresses suicidal ideation.  Nursing note and vitals reviewed.   ED Course  Procedures (including critical care time) Medications  levalbuterol (XOPENEX) 0.63 MG/3ML nebulizer solution (  Not Given  11/21/15 2005)  levalbuterol (XOPENEX) nebulizer solution 0.63 mg (0.63 mg Nebulization Given 11/21/15 2000)  LORazepam (ATIVAN) injection 2 mg (2 mg Intravenous Given 11/21/15 2052)     DIAGNOSTIC STUDIES: Oxygen Saturation is 96% on RA, normal by my interpretation.    COORDINATION OF CARE: 7:07 PM-Discussed treatment plan which includes BMP and CBC with pt at bedside and pt agreed to plan.   Labs Review Labs Reviewed  BASIC METABOLIC PANEL - Abnormal; Notable for the following:    Sodium 130 (*)    Chloride 99 (*)    CO2 18 (*)    Glucose, Bld 140 (*)    Calcium 8.8 (*)    All other components within normal limits  ETHANOL - Abnormal; Notable for the following:    Alcohol, Ethyl (B) 225 (*)    All other components within normal limits  CBC WITH DIFFERENTIAL/PLATELET    Imaging Review Dg Chest Portable 1 View  11/21/2015  CLINICAL DATA:  Shortness of breath with central chest pain for 2 weeks. EXAM: PORTABLE CHEST 1 VIEW COMPARISON:  10/26/2015. FINDINGS: The heart size and mediastinal contours are within normal limits. Both lungs are clear. The visualized skeletal structures are unremarkable. Mild LEFT base scarring or atelectasis is improved from recent priors. IMPRESSION: No active infiltrates or failure. Mild LEFT base scarring or atelectasis is improved. Electronically Signed   By: Staci Righter M.D.   On: 11/21/2015 20:53   I have personally reviewed and evaluated these lab results as part of my medical decision-making.  I discussed patient with Dr. Hal Hope at [facility]  Patient be admitted to stepdown for detox from alcohol and then psychiatry will be consult and for depression and suicidal thoughts.  MDM   Final diagnoses:  Alcohol withdrawal, with unspecified complication ([place])  Suicidal thoughts    I personally performed the services described in this documentation, which was scribed in my presence. The recorded information has been reviewed and  considered.     Leonard Schwartz, MD 11/21/15 2100  Leonard Schwartz, MD 11/21/15 2234

## 2015-11-21 NOTE — Telephone Encounter (Signed)
[name]  Patient Name: [name]  DOB: 1951/07/31    Initial Comment Caller states the patient doesnt feel himself, feels dehydrated, no saliva, has shortness of breath.   Nurse Assessment  Nurse: Genoveva Ill, RN, Lattie Haw Date/Time (Eastern Time): 11/21/2015 4:08:42 PM  Confirm and document reason for call. If symptomatic, describe symptoms. You must click the next button to save text entered. ---Caller states the patient doesn't feel himself, feels dehydrated, no saliva, urinating and drinking well; has shortness of breath, more than usual and has COPD  Has the patient traveled out of the country within the last 30 days? ---Not Applicable  Does the patient have any new or worsening symptoms? ---Yes  Will a triage be completed? ---Yes  Related visit to physician within the last 2 weeks? ---No  Does the PT have any chronic conditions? (i.e. diabetes, asthma, etc.) ---Yes  List chronic conditions. ---COPD, HTN  Is this a behavioral health or substance abuse call? ---No     Guidelines    Guideline Title Affirmed Question Affirmed Notes  Breathing Difficulty Bluish lips, tongue, or face now    Final Disposition User   Call EMS 911 Now [place], RN, Lattie Haw    Comments  Caller affirms lips are bluish and states he's blue all over; agrees to go to ER, refuses 911 and states no one can take to ER and will drive; then states he won't do anything without speaking to PCP; called office and reported to [place], who states will give message to PCP ASAP to call pt   Referrals  [place] - ED   Disagree/Comply: Comply

## 2015-11-21 NOTE — Telephone Encounter (Signed)
Spoke with patient to review symptoms -- Increased SOB with wheezing, feeling dehydrated, hematuria with R inguinal pain (has had recent Urological procedure). Discussed need for ER assessment and patient agrees. Refuses 911. His wife [name] is bringing him to [facility] ER for assessment.

## 2015-11-21 NOTE — ED Notes (Addendum)
Pt came to doorway and stated he had to use the restroom, but didn't feel well. States he has COPD and feels tight. Richardson Landry, RRT in to assess.

## 2015-11-21 NOTE — ED Notes (Addendum)
edp asked to come to the room because patient refuses to go to [place]. [place] at the bedside. Discussed with patient.

## 2015-11-21 NOTE — ED Notes (Signed)
Upon entering room. Pt asked how he was feeling pt states "I feel just peachy, but I'll really tell you how I feel if "she" referring to his wife leaves the room. Life obliges and leaves. Pt sitting on the foot of the bed stating "I thought I was going through DTs today so I drank an insurmountable amount of alcohol, mainly because I have a horrible dry mouth" Dr. Audie Pinto enters the room and patient continues to say "I just don't want to go on, I am tired of dealing with this feeling everyday, feeling bad everyday and I am tired of going Dr to Dr and taking my wife along for this ride. I just don't want to go on anymore."   Patient agrees to our services to help him.

## 2015-11-21 NOTE — ED Notes (Signed)
No orders indicated for SI/HI per EDP. Pt will admitted for DT and further treatment.

## 2015-11-21 NOTE — ED Notes (Signed)
Pt refused to take clothing off. [facility] notified.

## 2015-11-21 NOTE — ED Notes (Addendum)
C/o SOB, dry mouth x 2 weeks-wife states PCP dx with "DTs"-no ETOH x 2 weeks until today-pt admits to ETOH today-anxious but cooperative

## 2015-11-21 NOTE — Telephone Encounter (Signed)
Received call from [facility] with [facility]. She advised pt to call 911. Pt refused stating he will not do anything until his doctor tells him he needs to. Gave msg directly to [place]. He will call pt before leaving.

## 2015-11-21 NOTE — Telephone Encounter (Signed)
Pt called stating that he is not feeling himself today. He states he is very dry and that he is SOB. He said that he has been using inhaler and nebulizer more the last few days. Transferred to [facility].

## 2015-11-22 ENCOUNTER — Other Ambulatory Visit: Payer: Self-pay

## 2015-11-22 ENCOUNTER — Encounter (HOSPITAL_COMMUNITY): Payer: Self-pay | Admitting: Internal Medicine

## 2015-11-22 DIAGNOSIS — F10231 Alcohol dependence with withdrawal delirium: Secondary | ICD-10-CM

## 2015-11-22 DIAGNOSIS — J441 Chronic obstructive pulmonary disease with (acute) exacerbation: Secondary | ICD-10-CM | POA: Diagnosis present

## 2015-11-22 DIAGNOSIS — F10939 Alcohol use, unspecified with withdrawal, unspecified: Secondary | ICD-10-CM | POA: Diagnosis present

## 2015-11-22 DIAGNOSIS — F1020 Alcohol dependence, uncomplicated: Secondary | ICD-10-CM

## 2015-11-22 DIAGNOSIS — I10 Essential (primary) hypertension: Secondary | ICD-10-CM

## 2015-11-22 DIAGNOSIS — F10931 Alcohol use, unspecified with withdrawal delirium: Secondary | ICD-10-CM | POA: Diagnosis present

## 2015-11-22 DIAGNOSIS — F1014 Alcohol abuse with alcohol-induced mood disorder: Secondary | ICD-10-CM | POA: Diagnosis present

## 2015-11-22 DIAGNOSIS — F10239 Alcohol dependence with withdrawal, unspecified: Principal | ICD-10-CM

## 2015-11-22 LAB — COMPREHENSIVE METABOLIC PANEL
ALBUMIN: 4.2 g/dL (ref 3.5–5.0)
ALK PHOS: 59 U/L (ref 38–126)
ALT: 31 U/L (ref 17–63)
AST: 28 U/L (ref 15–41)
Anion gap: 13 (ref 5–15)
BUN: 14 mg/dL (ref 6–20)
CALCIUM: 8.7 mg/dL — AB (ref 8.9–10.3)
CO2: 19 mmol/L — AB (ref 22–32)
CREATININE: 1.06 mg/dL (ref 0.61–1.24)
Chloride: 102 mmol/L (ref 101–111)
GFR calc Af Amer: >60 mL/min (ref >60)
GFR calc non Af Amer: >60 mL/min (ref >60)
GLUCOSE: 108 mg/dL — AB (ref 65–99)
Potassium: 3.7 mmol/L (ref 3.5–5.1)
SODIUM: 134 mmol/L — AB (ref 135–145)
Total Bilirubin: 0.5 mg/dL (ref 0.3–1.2)
Total Protein: 7 g/dL (ref 6.5–8.1)

## 2015-11-22 LAB — CBC
HCT: 39.5 % (ref 39.0–52.0)
Hemoglobin: 13.4 g/dL (ref 13.0–17.0)
MCH: 31.2 pg (ref 26.0–34.0)
MCHC: 33.9 g/dL (ref 30.0–36.0)
MCV: 92.1 fL (ref 78.0–100.0)
PLATELETS: 253 10*3/uL (ref 150–400)
RBC: 4.29 MIL/uL (ref 4.22–5.81)
RDW: 13.6 % (ref 11.5–15.5)
WBC: 7.5 10*3/uL (ref 4.0–10.5)

## 2015-11-22 LAB — PROTIME-INR
INR: 1.05 (ref 0.00–1.49)
PROTHROMBIN TIME: 13.9 s (ref 11.6–15.2)

## 2015-11-22 LAB — OSMOLALITY, URINE: Osmolality, Ur: 226 mOsm/kg — ABNORMAL LOW (ref 300–900)

## 2015-11-22 LAB — RAPID URINE DRUG SCREEN, HOSP PERFORMED
AMPHETAMINES: NOT DETECTED
BARBITURATES: NOT DETECTED
BENZODIAZEPINES: NOT DETECTED
COCAINE: NOT DETECTED
OPIATES: NOT DETECTED
TETRAHYDROCANNABINOL: NOT DETECTED

## 2015-11-22 LAB — MRSA PCR SCREENING: MRSA by PCR: NEGATIVE

## 2015-11-22 LAB — CREATININE, URINE, RANDOM: Creatinine, Urine: 46.27 mg/dL

## 2015-11-22 LAB — TSH: TSH: 1.626 u[IU]/mL (ref 0.350–4.500)

## 2015-11-22 LAB — MAGNESIUM: Magnesium: 2 mg/dL (ref 1.7–2.4)

## 2015-11-22 LAB — PHOSPHORUS: Phosphorus: 3.5 mg/dL (ref 2.5–4.6)

## 2015-11-22 LAB — SODIUM, URINE, RANDOM: Sodium, Ur: 11 mmol/L

## 2015-11-22 MED ORDER — ALPRAZOLAM 1 MG PO TABS: 1.0000 mg | ORAL_TABLET | Freq: Every day | ORAL | Status: DC | PRN

## 2015-11-22 MED ORDER — LORATADINE 10 MG PO TABS
10.0000 mg | ORAL_TABLET | Freq: Every day | ORAL | Status: DC | PRN
  Filled 2015-11-22: qty 1

## 2015-11-22 MED ORDER — FLUTICASONE FUROATE-VILANTEROL 100-25 MCG/INH IN AEPB
1.0000 | INHALATION_SPRAY | Freq: Every morning | RESPIRATORY_TRACT | Status: DC
  Filled 2015-11-22: qty 1

## 2015-11-22 MED ORDER — SODIUM CHLORIDE 0.9 % IV SOLN
INTRAVENOUS | Status: DC
  Administered 2015-11-22 – 2015-11-23 (×2): via INTRAVENOUS

## 2015-11-22 MED ORDER — IPRATROPIUM BROMIDE 0.02 % IN SOLN
0.5000 mg | Freq: Four times a day (QID) | RESPIRATORY_TRACT | Status: DC
  Administered 2015-11-22: 0.5 mg via RESPIRATORY_TRACT
  Filled 2015-11-22: qty 2.5

## 2015-11-22 MED ORDER — TAMSULOSIN HCL 0.4 MG PO CAPS
0.4000 mg | ORAL_CAPSULE | Freq: Every day | ORAL | Status: DC
  Administered 2015-11-22: 0.4 mg via ORAL
  Filled 2015-11-22: qty 1

## 2015-11-22 MED ORDER — VITAMIN B-1 100 MG PO TABS
100.0000 mg | ORAL_TABLET | Freq: Every day | ORAL | Status: DC
  Administered 2015-11-22 – 2015-11-23 (×2): 100 mg via ORAL
  Filled 2015-11-22 (×2): qty 1

## 2015-11-22 MED ORDER — ACETAMINOPHEN 650 MG RE SUPP: 650.0000 mg | Freq: Four times a day (QID) | RECTAL | Status: DC | PRN

## 2015-11-22 MED ORDER — FLUTICASONE PROPIONATE 50 MCG/ACT NA SUSP
2.0000 | NASAL | Status: DC | PRN
  Administered 2015-11-22: 2 via NASAL
  Filled 2015-11-22: qty 16

## 2015-11-22 MED ORDER — TRAZODONE HCL 50 MG PO TABS
50.0000 mg | ORAL_TABLET | Freq: Every day | ORAL | Status: DC
  Administered 2015-11-22: 50 mg via ORAL
  Filled 2015-11-22 (×2): qty 1

## 2015-11-22 MED ORDER — SODIUM CHLORIDE 0.9% FLUSH
3.0000 mL | Freq: Two times a day (BID) | INTRAVENOUS | Status: DC
  Administered 2015-11-22 (×2): 3 mL via INTRAVENOUS

## 2015-11-22 MED ORDER — ENOXAPARIN SODIUM 40 MG/0.4ML ~~LOC~~ SOLN
40.0000 mg | SUBCUTANEOUS | Status: DC
  Administered 2015-11-22 – 2015-11-23 (×2): 40 mg via SUBCUTANEOUS
  Filled 2015-11-22 (×2): qty 0.4

## 2015-11-22 MED ORDER — FOLIC ACID 1 MG PO TABS
1.0000 mg | ORAL_TABLET | Freq: Every day | ORAL | Status: DC
  Administered 2015-11-22 – 2015-11-23 (×2): 1 mg via ORAL
  Filled 2015-11-22 (×2): qty 1

## 2015-11-22 MED ORDER — PREDNISONE 20 MG PO TABS
40.0000 mg | ORAL_TABLET | Freq: Every day | ORAL | Status: DC
  Administered 2015-11-22 – 2015-11-23 (×2): 40 mg via ORAL
  Filled 2015-11-22 (×3): qty 2

## 2015-11-22 MED ORDER — ALBUTEROL SULFATE (2.5 MG/3ML) 0.083% IN NEBU: 2.5000 mg | INHALATION_SOLUTION | RESPIRATORY_TRACT | Status: DC | PRN

## 2015-11-22 MED ORDER — FLUTICASONE PROPIONATE 50 MCG/ACT NA SUSP
2.0000 | Freq: Every day | NASAL | Status: DC
  Filled 2015-11-22 (×2): qty 16

## 2015-11-22 MED ORDER — LORAZEPAM 2 MG/ML IJ SOLN
2.0000 mg | INTRAMUSCULAR | Status: DC | PRN
  Administered 2015-11-22 (×4): 2 mg via INTRAVENOUS
  Filled 2015-11-22 (×5): qty 1

## 2015-11-22 MED ORDER — MONTELUKAST SODIUM 10 MG PO TABS
10.0000 mg | ORAL_TABLET | Freq: Every day | ORAL | Status: DC
  Administered 2015-11-22: 10 mg via ORAL
  Filled 2015-11-22 (×2): qty 1

## 2015-11-22 MED ORDER — IPRATROPIUM-ALBUTEROL 0.5-2.5 (3) MG/3ML IN SOLN
3.0000 mL | Freq: Two times a day (BID) | RESPIRATORY_TRACT | Status: DC
  Administered 2015-11-22 – 2015-11-23 (×3): 3 mL via RESPIRATORY_TRACT
  Filled 2015-11-22 (×3): qty 3

## 2015-11-22 MED ORDER — ACETAMINOPHEN 325 MG PO TABS: 650.0000 mg | ORAL_TABLET | Freq: Four times a day (QID) | ORAL | Status: DC | PRN

## 2015-11-22 MED ORDER — DOXYCYCLINE HYCLATE 100 MG PO TABS
100.0000 mg | ORAL_TABLET | Freq: Two times a day (BID) | ORAL | Status: DC
  Administered 2015-11-22 – 2015-11-23 (×3): 100 mg via ORAL
  Filled 2015-11-22 (×4): qty 1

## 2015-11-22 MED ORDER — HYDROCODONE-ACETAMINOPHEN 5-325 MG PO TABS: 1.0000 | ORAL_TABLET | ORAL | Status: DC | PRN

## 2015-11-22 MED ORDER — GUAIFENESIN ER 600 MG PO TB12
600.0000 mg | ORAL_TABLET | Freq: Two times a day (BID) | ORAL | Status: DC
  Administered 2015-11-22 – 2015-11-23 (×3): 600 mg via ORAL
  Filled 2015-11-22 (×4): qty 1

## 2015-11-22 MED ORDER — MOMETASONE FURO-FORMOTEROL FUM 100-5 MCG/ACT IN AERO
2.0000 | INHALATION_SPRAY | Freq: Two times a day (BID) | RESPIRATORY_TRACT | Status: DC
  Filled 2015-11-22: qty 8.8

## 2015-11-22 MED ORDER — THIAMINE HCL 100 MG/ML IJ SOLN
Freq: Once | INTRAVENOUS | Status: AC
  Administered 2015-11-22: 03:00:00 via INTRAVENOUS
  Filled 2015-11-22: qty 1000

## 2015-11-22 MED ORDER — TRAMADOL HCL 50 MG PO TABS
100.0000 mg | ORAL_TABLET | Freq: Three times a day (TID) | ORAL | Status: DC
  Administered 2015-11-22 – 2015-11-23 (×4): 100 mg via ORAL
  Filled 2015-11-22 (×4): qty 2

## 2015-11-22 MED ORDER — TRAMADOL HCL ER 300 MG PO TB24: 300.0000 mg | ORAL_TABLET | Freq: Every day | ORAL | Status: DC

## 2015-11-22 MED ORDER — LORATADINE 10 MG PO TABS
10.0000 mg | ORAL_TABLET | Freq: Every day | ORAL | Status: DC
  Administered 2015-11-22: 10 mg via ORAL
  Filled 2015-11-22: qty 1

## 2015-11-22 MED ORDER — CITALOPRAM HYDROBROMIDE 20 MG PO TABS
20.0000 mg | ORAL_TABLET | Freq: Every day | ORAL | Status: DC
  Administered 2015-11-22: 20 mg via ORAL
  Filled 2015-11-22: qty 1

## 2015-11-22 NOTE — Telephone Encounter (Signed)
Follow up call made to patient. No answer. Noted in chart patient has been admitted to hospital.

## 2015-11-22 NOTE — H&P (Signed)
PCP:  Leeanne Rio, PA-C    Referring provider transferred from [facility] [place]   Chief Complaint: Shortness of breath  HPI: [name] is a 65 y.o. male   has a past medical history of Hypertension; GERD (gastroesophageal reflux disease); History of TB (tuberculosis); Post-polio syndrome; History of chronic bronchitis; History of rheumatic fever; Bladder outlet obstruction; Anxiety disorder; History of urinary retention; Nocturnal oxygen desaturation; Complication of anesthesia; Diabetes mellitus without complication ([place]); Anxiety; Dysrhythmia; Arthritis; History of oxygen administration; COPD, frequent exacerbations ([place]); PONV (postoperative nausea and vomiting); Family history of adverse reaction to anesthesia; Shortness of breath dyspnea; Pneumonia; multiple concussions; Depression; Schizophrenia ([place]); Prostate cancer ([place]); Prostate cancer ([place]); and Melanoma ([place]).   Presented with increased shortness of breath and some wheezing 2 weeks . patient states that he hasn't been eating well for the past few weeks and has been having some dry mouth. He has been drinking usually every day but hasn't been drinking regular basis for past 2 weeks until today. He usually drinks about 12 pack of beer per day but has been cutting down. Tryin to wean himself off. Reports increase in anxiety shortness of breath has been gradually in onset.  IN ER: Initially presented to med [facility] on emergency department chest x-ray showed no evidence of infiltrate. Patient was given Ativan 1 mg  IV 2 and then 2 mg, Haldol 1 mg for presumed withdrawal symptoms given on anxiety patient expressed suicidal ideations without any plan    Hospitalist was called for admission for alcohol withdrawal COPD exacerbation and suicidal ideation  Review of Systems:    Pertinent positives include: Shortness of breath and wheezing dyspnea on exertion,  non-productive cough,  Constitutional:  No  weight loss, night sweats, Fevers, chills, fatigue, weight loss  HEENT:  No headaches, Difficulty swallowing,Tooth/dental problems,Sore throat,  No sneezing, itching, ear ache, nasal congestion, post nasal drip,  Cardio-vascular:  No chest pain, Orthopnea, PND, anasarca, dizziness, palpitations.no Bilateral lower extremity swelling  GI:  No heartburn, indigestion, abdominal pain, nausea, vomiting, diarrhea, change in bowel habits, loss of appetite, melena, blood in stool, hematemesis Resp:   No excess mucus, no productive cough,  No coughing up of blood.No change in color of mucus  Skin:  no rash or lesions. No jaundice GU:  no dysuria, change in color of urine, no urgency or frequency. No straining to urinate.  No flank pain.  Musculoskeletal:  No joint pain or no joint swelling. No decreased range of motion. No back pain.  Psych:  No change in mood or affect. No depression or anxiety. No memory loss.  Neuro: no localizing neurological complaints, no tingling, no weakness, no double vision, no gait abnormality, no slurred speech, no confusion  Otherwise ROS are negative except for above, 10 systems were reviewed  Past Medical History: Past Medical History  Diagnosis Date  . Hypertension   . GERD (gastroesophageal reflux disease)   . History of TB (tuberculosis)     1984--  hospitalized for 4 month treatment  . Post-polio syndrome     polio at age 39--PT WAS IN IRON LUNG; PT WAS IN W/C UNTIL AGE 87; STILL HAS WEAKNESS RIGHT SIDE  . History of chronic bronchitis   . History of rheumatic fever   . Bladder outlet obstruction   . Anxiety disorder   . History of urinary retention   . Nocturnal oxygen desaturation     USES O2 NIGHTLY  . Complication of anesthesia  DIFFICULT WAKING   . Diabetes mellitus without complication ([place])     BODERLINE - DIET CONTROL  . Anxiety   . Dysrhythmia     PVC'S  . Arthritis     BILATERAL SHOULDERS, ELBOWS AND HANDS AND LEFT HIP AND KNEES--HX  CORTISONE SHOTS IN SHOULDERS, ELBOWS, HIP AND KNEES  . History of oxygen administration     oxygen use 2 l/m nasally at bedtime and exertional occasions  . COPD, frequent exacerbations ([place])     pulmologist-  dr Joya Gaskins--  [place] Rm Stage C.04-25-15 recent COPD exacerbation-much improved now, after tx. in ER [facility].  Marland Kitchen PONV (postoperative nausea and vomiting)   . Family history of adverse reaction to anesthesia     father would wake up with agitation   . Shortness of breath dyspnea     RIGHT HEMIDIAPHRAGM ELEVATION - POST POLIO SYNDROME  . Pneumonia     hx of   . Hx of multiple concussions     x 2 per patient   . Depression   . Schizophrenia ([place])   . Prostate cancer ([place])   . Prostate cancer ([place])   . Melanoma [facility])    Past Surgical History  Procedure Laterality Date  . Other surgical history       Muscle & bone Graft/Polio  . Cystoscopy w/ cystogram/  transrectal ultrasound prostate bx  03-22-2009  . Cardiovascular stress test  06-08-2014  dr Mare Ferrari    normal lexiscan study/  no ischemia/  not gated due to PAC's  . Ureterosopy stone extraction  2000  . Nasal septum surgery  2000  . Shoulder arthroscopy with open rotator cuff repair Bilateral 2013  &  1999    removal spurs and bursectomy  . Laparoscopic cholecystectomy  2013  . Prostate biopsy N/A 09/28/2014    Procedure: PROSTATE ULTRASOUND/BIOPSY;  Surgeon: [name], MD;  Location: [facility];  Service: Urology;  Laterality: N/A;  . Transurethral resection of prostate N/A 09/28/2014    Procedure: TRANSURETHRAL RESECTION OF THE PROSTATE (TURP);  Surgeon: [name], MD;  Location: [facility];  Service: Urology;  Laterality: N/A;  . Esophagogastroduodenoscopy N/A 03/22/2015    Procedure: ESOPHAGOGASTRODUODENOSCOPY (EGD) with dilation;  Surgeon: [name], MD;  Location: [facility];  Service: Endoscopy;  Laterality: N/A;  . [place] dilation N/A 03/22/2015    Procedure: SAVORY DILATION;  Surgeon: [name], MD;  Location: [facility];  Service: Endoscopy;  Laterality: N/A;  . Colonoscopy N/A 05/03/2015    Procedure: COLONOSCOPY;  Surgeon: [name], MD;  Location: [facility];  Service: Endoscopy;  Laterality: N/A;  . Polio surgeries       14 polio surgeries   . Left elbow surgery       due to fracture   . Prostate biopsy N/A 10/25/2015    Procedure: PROSTATE BIOPSY AND ULTRASOUND;  Surgeon: [name], MD;  Location: [facility];  Service: Urology;  Laterality: N/A;  . Transurethral incision of bladder neck N/A 10/25/2015    Procedure:  TRANSURETHRAL INCISION OF BLADDER NECK;  Surgeon: [name], MD;  Location: [facility];  Service: Urology;  Laterality: N/A;  . Cystoscopy N/A 10/25/2015    Procedure: CYSTOSCOPY;  Surgeon: [name], MD;  Location: [facility];  Service: Urology;  Laterality: N/A;     Medications: Prior to Admission medications   Medication Sig Start Date End Date Taking? Authorizing Provider  albuterol (PROVENTIL HFA;VENTOLIN HFA) 108 (90 Base) MCG/ACT inhaler Inhale 2 puffs  into the lungs every 6 (six) hours as needed for wheezing or shortness of breath. 10/20/15   Brunetta Jeans, PA-C  ALPRAZolam [name]) 1 MG tablet Take 0.5 tablets (0.5 mg total) by mouth 3 (three) times daily as needed for anxiety. Patient taking differently: Take 1 mg by mouth daily as needed for anxiety.  09/06/15   Brunetta Jeans, PA-C  amLODipine (NORVASC) 10 MG tablet Take 1 tablet (10 mg total) by mouth daily. 10/19/15   Brunetta Jeans, PA-C  citalopram (CELEXA) 20 MG tablet Take 1 tablet (20 mg total) by mouth daily. 10/20/15   Brunetta Jeans, PA-C  dexlansoprazole (DEXILANT) 60 MG capsule Take 1 capsule (60 mg total) by mouth daily. 11/14/15   Brunetta Jeans, PA-C  diphenhydrAMINE (SOMINEX) 25 MG tablet Take 25 mg by mouth daily as needed for itching or sleep.    Historical Provider, MD  fluticasone (FLONASE) 50 MCG/ACT nasal spray Place 2 sprays into both nostrils daily. 08/03/15   Brunetta Jeans, PA-C  Fluticasone  Furoate-Vilanterol (BREO ELLIPTA) 100-25 MCG/INH AEPB INHALE 1 PUFF INTO THE LUNGS EVERY MORNING. DAILY 10/25/15   Brunetta Jeans, PA-C  loratadine (CLARITIN) 10 MG tablet Take 10 mg by mouth daily. 08/01/15   Historical Provider, MD  metoprolol succinate (TOPROL-XL) 25 MG 24 hr tablet Take 1 tablet (25 mg total) by mouth daily. 10/20/15   Brunetta Jeans, PA-C  montelukast (SINGULAIR) 10 MG tablet TAKE 1 TABLET (10 MG TOTAL) BY MOUTH AT BEDTIME. 10/20/15   Brunetta Jeans, PA-C  nitroGLYCERIN (NITROSTAT) 0.4 MG SL tablet Place 1 tablet (0.4 mg total) under the tongue every 5 (five) minutes as needed for chest pain. 06/21/15   Brunetta Jeans, PA-C  ondansetron (ZOFRAN ODT) 4 MG disintegrating tablet Take 1 tablet (4 mg total) by mouth every 8 (eight) hours as needed for nausea. 09/28/15   Tanna Furry, MD  OXYGEN Inhale 2 L into the lungs at bedtime.    Historical Provider, MD  polyethylene glycol (MIRALAX / GLYCOLAX) packet Take 17 g by mouth 2 (two) times daily. 11/21/15   Leonard Schwartz, MD  promethazine (PHENERGAN) 25 MG tablet Take 25 mg by mouth every 6 (six) hours as needed for nausea or vomiting.    Historical Provider, MD  silodosin (RAPAFLO) 8 MG CAPS capsule Take 8 mg by mouth daily with breakfast.    Historical Provider, MD  tiotropium (SPIRIVA) 18 MCG inhalation capsule Place 1 capsule (18 mcg total) into inhaler and inhale daily. IN AM Patient taking differently: Place 18 mcg into inhaler and inhale at bedtime.  05/05/15   Brunetta Jeans, PA-C  traMADol (ULTRAM-ER) 300 MG 24 hr tablet Take 1 tablet (300 mg total) by mouth daily. 11/09/15   Brunetta Jeans, PA-C    Allergies:   Allergies  Allergen Reactions  . Ivp Dye [Iodinated Diagnostic Agents] Anaphylaxis  . Levaquin [Levofloxacin In D5w] Shortness Of Breath and Swelling    In addition: sweating, chest pain, and diarrhea.   . Penicillins Anaphylaxis    Heart stops Has patient had a PCN reaction causing immediate rash,  facial/tongue/throat swelling, SOB or lightheadedness with hypotension: yes Has patient had a PCN reaction causing severe rash involving mucus membranes or skin necrosis: yes Has patient had a PCN reaction that required hospitalization yes Has patient had a PCN reaction occurring within the last 10 years: No If all of the above answers are "NO", then may proceed with Cephalosporin use.   Marland Kitchen  Aspirin Other (See Comments)    Reaction unknown  . Nsaids Other (See Comments)    Difficulty breathing  . Morphine And Related Nausea And Vomiting    Can take with zofran     Social History:  Ambulatory   independently   Lives at home   With family     reports that he quit smoking about 2 years ago. He has quit using smokeless tobacco. He reports that he drinks alcohol. He reports that he does not use illicit drugs.     Family History: family history includes Alcohol abuse in his sister; Alzheimer's disease in his paternal aunt and paternal uncle; Alzheimer's disease (age of onset: 66) in his father; Diabetes in his maternal aunt and maternal uncle; Heart attack in his father; Heart disease in his father; Mental illness in his sister; Skin cancer in his mother; Stomach cancer in his father; Thyroid disease in his maternal aunt; Tuberculosis in his paternal grandfather and paternal grandmother. There is no history of Colon cancer, Colon polyps, Crohn's disease, or Ulcerative colitis.    Physical Exam: Patient Vitals for the past 24 hrs:  BP Temp Temp src Pulse Resp SpO2 Height Weight  11/21/15 2250 96/62 mmHg - - 88 18 97 % - -  11/21/15 2050 114/59 mmHg - - 70 - - - -  11/21/15 2050 114/59 mmHg - - 71 - 93 % - -  11/21/15 2045 113/71 mmHg 97.9 F (36.6 C) Oral 70 18 96 % - -  11/21/15 2000 - - - - - 95 % - -  11/21/15 1837 157/82 mmHg 98.3 F (36.8 C) Oral 78 24 96 % 6' (1.829 m) 96.163 kg (212 lb)    1. General:  in No Acute distress 2. Psychological: somnolent but arrousable 3. Head/ENT:      Dry Mucous Membranes                          Head Non traumatic, neck supple                          Normal  Dentition 4. SKIN:  decreased Skin turgor,  Skin clean Dry and intact no rash 5. Heart: Regular rate and rhythm no  Murmur, Rub or gallop 6. Lungs: no wheezes or crackles   7. Abdomen: Soft, non-tender, Non distended 8. Lower extremities: no clubbing, cyanosis, or edema 9. Neurologically Grossly intact, moving all 4 extremities equally, no tremor 10. MSK: Normal range of motion  body mass index is 28.75 kg/(m^2).   Labs on Admission:   Results for orders placed or performed during the hospital encounter of 11/21/15 (from the past 24 hour(s))  Basic metabolic panel     Status: Abnormal   Collection Time: 11/21/15  7:40 PM  Result Value Ref Range   Sodium 130 (L) 135 - 145 mmol/L   Potassium 4.4 3.5 - 5.1 mmol/L   Chloride 99 (L) 101 - 111 mmol/L   CO2 18 (L) 22 - 32 mmol/L   Glucose, Bld 140 (H) 65 - 99 mg/dL   BUN 15 6 - 20 mg/dL   Creatinine, Ser 1.19 0.61 - 1.24 mg/dL   Calcium 8.8 (L) 8.9 - 10.3 mg/dL   GFR calc non Af Amer >60 >60 mL/min   GFR calc Af Amer >60 >60 mL/min   Anion gap 13 5 - 15  CBC with Differential/Platelet     Status:  None   Collection Time: 11/21/15  7:40 PM  Result Value Ref Range   WBC 8.1 4.0 - 10.5 K/uL   RBC 4.48 4.22 - 5.81 MIL/uL   Hemoglobin 13.6 13.0 - 17.0 g/dL   HCT 40.6 39.0 - 52.0 %   MCV 90.6 78.0 - 100.0 fL   MCH 30.4 26.0 - 34.0 pg   MCHC 33.5 30.0 - 36.0 g/dL   RDW 13.1 11.5 - 15.5 %   Platelets 268 150 - 400 K/uL   Neutrophils Relative % 76 %   Neutro Abs 6.2 1.7 - 7.7 K/uL   Lymphocytes Relative 16 %   Lymphs Abs 1.3 0.7 - 4.0 K/uL   Monocytes Relative 4 %   Monocytes Absolute 0.3 0.1 - 1.0 K/uL   Eosinophils Relative 3 %   Eosinophils Absolute 0.2 0.0 - 0.7 K/uL   Basophils Relative 1 %   Basophils Absolute 0.1 0.0 - 0.1 K/uL  Ethanol     Status: Abnormal   Collection Time: 11/21/15  7:40 PM  Result Value  Ref Range   Alcohol, Ethyl (B) 225 (H) <5 mg/dL    UA not obtained  Lab Results  Component Value Date   HGBA1C 5.8 03/21/2015    Estimated Creatinine Clearance: 73.4 mL/min (by C-G formula based on Cr of 1.19).  BNP (last 3 results) No results for input(s): PROBNP in the last 8760 hours.  Other results:  I have pearsonaly reviewed this: ECG REPORT Not obtained  [facility] Weights   11/21/15 1837  Weight: 96.163 kg (212 lb)     Cultures:    Component Value Date/Time   SDES URINE, CLEAN CATCH 10/05/2015 0155   SPECREQUEST NONE 10/05/2015 0155   CULT  10/05/2015 0155    1,000 COLONIES/mL INSIGNIFICANT GROWTH Performed at [place] [facility] 10/06/2015 FINAL 10/05/2015 0155     Radiological Exams on Admission: Dg Chest Portable 1 View  11/21/2015  CLINICAL DATA:  Shortness of breath with central chest pain for 2 weeks. EXAM: PORTABLE CHEST 1 VIEW COMPARISON:  10/26/2015. FINDINGS: The heart size and mediastinal contours are within normal limits. Both lungs are clear. The visualized skeletal structures are unremarkable. Mild LEFT base scarring or atelectasis is improved from recent priors. IMPRESSION: No active infiltrates or failure. Mild LEFT base scarring or atelectasis is improved. Electronically Signed   By: Staci Righter M.D.   On: 11/21/2015 20:53    Chart has been reviewed  Family not at  Bedside   Assessment/Plan  65 year old gentleman with history of alcohol abuse presents with presumed COPD exacerbation alcohol withdrawal and passive suicidal ideation  Present on Admission:    suicidal ideation patient at this point still stating that he is unsure but may choose to harm himself. We'll continue suicide precautions Behavioral Health consult in the morning  . Alcoholism ([place]) once patient is more alert we'll need to consult regarding safe alcohol discontinuation  . Anxiety resume home dose of Xanax once able to tolerate  . COPD exacerbation ([facility]) -  mild, Albuterol PRN, Atrovent q 6 h, brio, doxycycline, mucinex, Will start on prednisone 60 mg a day . Alcohol withdrawal delirium ([place]) - CIWA protocol and observe in stepdown . OSA (obstructive sleep apnea) reports not using CPAP per history oxygen at home at night time resume . Hypertension given soft blood pressures will hold off on blood pressure medications. Once able to tolerate would resume beta blocker to avoid rebound tachycardia Prophylaxis:   Lovenox  CODE STATUS:  FULL CODE   Disposition:    To home once workup is complete and patient is stable  Other plan as per orders.  I have spent a total of 61 min on this admission extra time was spent to discuss case with [place] and requested consult  [place] 11/22/2015, 2:06 AM   [facility]  Pager [phone]   after 2 AM please page floor coverage PA If 7AM-7PM, please contact the day team taking care of the patient  [email or web address]  Password [email or web address]

## 2015-11-22 NOTE — Progress Notes (Addendum)
Progress note: Patient was admitted earlier during the day by my colleague, notes and labs reviewed, 64 year old male with history of COPD, presents with COPD exacerbation, alcohol intoxication, with known history of alcohol abuse, as well as expressed suicidal ideation, admitted to step down unit. - Patient awake alert oriented 3, currently denies any suicidal ideation. - Good air entry bilaterally, minimal end expiratory wheezing - Regular rate and rhythm, no rubs gallops or murmur - Abdomen soft nontender nondistended - Extremities with no edema no clubbing, no tremors  Assessment/plan: - Continue with CIWA protocol for alcohol abuse - Psychiatry consulted to evaluate for suicidal ideation, currently denies any, continue with suicide precautions until cleared by psychiatry - Noncompliant with C Pap for obstructive sleep apnea - Continue with prednisone and nebs for COPD. - Continue to hold antihypertensive medication for high blood pressure  Phillips Climes MD [phone]

## 2015-11-22 NOTE — Consult Note (Signed)
[facility] Face-to-Face Psychiatry Consult   Reason for Consult:  Alcohol dependence and withdrawal Referring Physician:  Dr. Waldron Labs Patient Identification: [name] MRN:  [id number] Principal Diagnosis: Alcohol abuse with alcohol-induced mood disorder [facility]) Diagnosis:   Patient Active Problem List   Diagnosis Date Noted  . Alcohol withdrawal ([place]) [F10.239] 11/22/2015  . COPD exacerbation ([place]) [J44.1] 11/22/2015  . Alcohol withdrawal delirium ([place]) [F10.231] 11/22/2015  . Suicidal thoughts [R45.851] 11/21/2015  . Elevated serum creatinine [R74.8] 11/13/2015  . Medication reaction [T88.7XXA] 11/13/2015  . Scapholunate dissociation of right wrist [M25.331] 11/13/2015  . Dry mouth [R68.2] 10/07/2015  . Facial flushing [R23.2] 09/19/2015  . Anxiety [F41.9] 09/19/2015  . Need for hepatitis C screening test [Z11.59] 08/15/2015  . Need for zoster vaccination [Z23] 08/15/2015  . Bleeding nose [R04.0] 08/15/2015  . OAB (overactive bladder) [N32.81] 07/27/2015  . Nocturia [R35.1] 06/27/2015  . Chronic idiopathic constipation [K59.04] 05/08/2015  . Screening for colon cancer [Z12.11]   . Benign neoplasm of cecum [D12.0]   . Benign neoplasm of ascending colon [D12.2]   . Benign neoplasm of transverse colon [D12.3]   . Benign neoplasm of sigmoid colon [D12.5]   . Rectal polyp [K62.1]   . Hyperglycemia [R73.9] 03/23/2015  . Gastroesophageal reflux disease without esophagitis [K21.9]   . Esophageal stricture [K22.2]   . Joint stiffness of multiple sites [M25.60] 10/17/2014  . Benign localized hyperplasia of prostate with urinary obstruction [N40.1, N13.8] 09/29/2014  . Memory loss [R41.3] 09/13/2014  . Tuberculosis [A15.0]   . Left knee pain [M25.562] 06/30/2014  . Diverticulum of bladder [N32.3] 06/17/2014  . Neuropathy ([place]) [G62.9] 04/11/2014  . Chronic knee pain [M25.569, G89.29] 02/22/2014  . Post-polio syndrome [G14] 01/13/2014  . COPD (chronic obstructive pulmonary disease)  [place] Frequent exacerbations [J44.9] 01/13/2014  . Hypertension [I10] 01/13/2014  . Prostate cancer ([place]) [C61] 01/13/2014  . Bipolar disorder ([place]) [F31.9] 01/13/2014  . Explosive personality disorder [F60.3] 01/13/2014  . Erectile dysfunction [N52.9] 01/13/2014  . Nephrolithiasis [N20.0] 01/13/2014  . Alcoholism ([place]) [F10.20] 01/13/2014  . OSA (obstructive sleep apnea) [G47.33] 01/13/2014    Total Time spent with patient: 1 hour  Subjective:   [name] is a 65 y.o. male admitted with shortness of breath and some wheezing 2 weeks and alcohol intoxication.  HPI:  [name] is a 65 y.o. male  has a past medical history of Hypertension; GERD (gastroesophageal reflux disease); History of TB (tuberculosis); Post-polio syndrome; History of chronic bronchitis; History of rheumatic fever; Bladder outlet obstruction; Anxiety disorder; History of urinary retention; Nocturnal oxygen desaturation; Complication of anesthesia; Diabetes mellitus without complication ([place]); Anxiety; Dysrhythmia; Arthritis; History of oxygen administration; COPD, frequent exacerbations ([place]); PONV (postoperative nausea and vomiting); Family history of adverse reaction to anesthesia; Shortness of breath dyspnea; Pneumonia; multiple concussions; Depression; Schizophrenia ([place]); Prostate cancer ([place]); Prostate cancer ([place]); and Melanoma ([place]).   Patient seen, chart reviewed for psychiatric face-to-face consultation and evaluation of depression, suicidal ideation and alcohol intoxication. Patient reportedly endorses being alcohol dependent since he was in his teen years. Patient is currently disabled and tried to quit drinking for the last 2 weeks and relapsed before came to the hospital. Patient reportedly drank 2 shots of bourbon, wine and beer  4 cans and his blood alcohol level is 225 on admission. Patient reportedly has no previous alcohol detox treatments or rehabilitation treatment for the last 25 years.  Patient stated he started drinking because of feeling thirsty when he was quit drinking for  2 weeks. Reportedly patient presented to the emergency department with the increased shortness of breath and some wheezing 2 weeks. Patient lives with his wife who has been working and has no children at home. Patient denies current symptoms of suicidal ideation, homicidal ideation, intention or plans. Patient has no evidence of psychosis. Patient was given Ativan 1 mg IV 2 and then 2 mg, Haldol 1 mg for presumed withdrawal symptoms given on anxiety while presented at [facility] emergency.Patient is willing to obtain alcohol detox treatment and also rehabilitation treatment but would like to do as outpatient. Patient will be referred to the chemical dependency intensive outpatient program when medically stable. 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   Past Psychiatric History: none reported  Risk to Self: Is patient at risk for suicide?: No Risk to Others:   Prior Inpatient Therapy:   Prior Outpatient Therapy:    Past Medical History:  Past Medical History  Diagnosis Date  . Hypertension   . GERD (gastroesophageal reflux disease)   . History of TB (tuberculosis)     1984--  hospitalized for 4 month treatment  . Post-polio syndrome     polio at age 33--PT WAS IN IRON LUNG; PT WAS IN W/C UNTIL AGE 4; STILL HAS WEAKNESS RIGHT SIDE  . History of chronic bronchitis   . History of rheumatic fever   . Bladder outlet obstruction   . Anxiety disorder   . History of urinary retention   . Nocturnal oxygen desaturation     USES O2 NIGHTLY  . Complication of anesthesia     DIFFICULT WAKING   . Diabetes mellitus without complication ([place])     BODERLINE - DIET CONTROL  . Anxiety   . Dysrhythmia     PVC'S  . Arthritis     BILATERAL SHOULDERS, ELBOWS AND HANDS AND LEFT HIP AND KNEES--HX CORTISONE SHOTS IN SHOULDERS, ELBOWS, HIP AND KNEES  . History of oxygen administration      oxygen use 2 l/m nasally at bedtime and exertional occasions  . COPD, frequent exacerbations ([place])     pulmologist-  dr Joya Gaskins--  [place] Rm Stage C.04-25-15 recent COPD exacerbation-much improved now, after tx. in ER [facility].  Marland Kitchen PONV (postoperative nausea and vomiting)   . Family history of adverse reaction to anesthesia     father would wake up with agitation   . Shortness of breath dyspnea     RIGHT HEMIDIAPHRAGM ELEVATION - POST POLIO SYNDROME  . Pneumonia     hx of   . Hx of multiple concussions     x 2 per patient   . Depression   . Schizophrenia ([place])   . Prostate cancer ([place])   . Prostate cancer ([place])   . Melanoma [facility])     Past Surgical History  Procedure Laterality Date  . Other surgical history       Muscle & bone Graft/Polio  . Cystoscopy w/ cystogram/  transrectal ultrasound prostate bx  03-22-2009  . Cardiovascular stress test  06-08-2014  dr Mare Ferrari    normal lexiscan study/  no ischemia/  not gated due to PAC's  . Ureterosopy stone extraction  2000  . Nasal septum surgery  2000  . Shoulder arthroscopy with open rotator cuff repair Bilateral 2013  &  1999    removal spurs and bursectomy  . Laparoscopic cholecystectomy  2013  . Prostate biopsy N/A 09/28/2014    Procedure: PROSTATE ULTRASOUND/BIOPSY;  Surgeon: [name], MD;  Location: [facility];  Service: Urology;  Laterality: N/A;  . Transurethral resection of prostate N/A 09/28/2014    Procedure: TRANSURETHRAL RESECTION OF THE PROSTATE (TURP);  Surgeon: [name], MD;  Location: [facility];  Service: Urology;  Laterality: N/A;  . Esophagogastroduodenoscopy N/A 03/22/2015    Procedure: ESOPHAGOGASTRODUODENOSCOPY (EGD) with dilation;  Surgeon: [name], MD;  Location: [facility];  Service: Endoscopy;  Laterality: N/A;  . [place] dilation N/A 03/22/2015    Procedure: SAVORY DILATION;  Surgeon: [name], MD;  Location: [facility];  Service: Endoscopy;  Laterality: N/A;  . Colonoscopy N/A 05/03/2015     Procedure: COLONOSCOPY;  Surgeon: [name], MD;  Location: [facility];  Service: Endoscopy;  Laterality: N/A;  . Polio surgeries       14 polio surgeries   . Left elbow surgery       due to fracture   . Prostate biopsy N/A 10/25/2015    Procedure: PROSTATE BIOPSY AND ULTRASOUND;  Surgeon: [name], MD;  Location: [facility];  Service: Urology;  Laterality: N/A;  . Transurethral incision of bladder neck N/A 10/25/2015    Procedure:  TRANSURETHRAL INCISION OF BLADDER NECK;  Surgeon: [name], MD;  Location: [facility];  Service: Urology;  Laterality: N/A;  . Cystoscopy N/A 10/25/2015    Procedure: CYSTOSCOPY;  Surgeon: [name], MD;  Location: [facility];  Service: Urology;  Laterality: N/A;   Family History:  Family History  Problem Relation Age of Onset  . Alzheimer's disease Father 4    Deceased  . Stomach cancer Father   . Heart attack Father   . Heart disease Father   . Skin cancer Mother     Facial-Living  . Alcohol abuse Sister     x2  . Mental illness Sister     x2  . Diabetes Maternal Aunt     x2  . Thyroid disease Maternal Aunt     x4  . Diabetes Maternal Uncle   . Tuberculosis Paternal Grandfather   . Tuberculosis Paternal Grandmother   . Alzheimer's disease Paternal Aunt   . Alzheimer's disease Paternal Uncle   . Colon cancer Neg Hx   . Colon polyps Neg Hx   . Crohn's disease Neg Hx   . Ulcerative colitis Neg Hx    Family Psychiatric  History: significant for alcoholism in several family members especially in his 2 sisters  Social History:  History  Alcohol Use  . Yes    Comment: daily     History  Drug Use No    Comment: hx of marijuana use     Social History   Social History  . Marital Status: Married    Spouse Name: N/A  . Number of Children: N/A  . Years of Education: N/A   Occupational History  . Retired    Social History Main Topics  . Smoking status: Former Smoker -- 65 years    Quit date: 08/25/2013  . Smokeless tobacco: Former Systems developer  .  Alcohol Use: Yes     Comment: daily  . Drug Use: No     Comment: hx of marijuana use   . Sexual Activity: Not Asked   Other Topics Concern  . None   Social History Narrative   Additional Social History:    Allergies:   Allergies  Allergen Reactions  . Ivp Dye [Iodinated Diagnostic Agents] Anaphylaxis  . Levaquin [Levofloxacin In D5w] Shortness Of Breath and Swelling    In addition:  sweating, chest pain, and diarrhea.   . Penicillins Anaphylaxis    Heart stops Has patient had a PCN reaction causing immediate rash, facial/tongue/throat swelling, SOB or lightheadedness with hypotension: yes Has patient had a PCN reaction causing severe rash involving mucus membranes or skin necrosis: yes Has patient had a PCN reaction that required hospitalization yes Has patient had a PCN reaction occurring within the last 10 years: No If all of the above answers are "NO", then may proceed with Cephalosporin use.   . Aspirin Other (See Comments)    Reaction unknown  . Nsaids Other (See Comments)    Difficulty breathing  . Tolmetin Other (See Comments)    Difficulty breathing  . Buprenorphine Hcl Nausea And Vomiting    Can take with zofran   . Morphine And Related Nausea And Vomiting    Can take with zofran     Labs:  Results for orders placed or performed during the hospital encounter of 11/21/15 (from the past 48 hour(s))  Basic metabolic panel     Status: Abnormal   Collection Time: 11/21/15  7:40 PM  Result Value Ref Range   Sodium 130 (L) 135 - 145 mmol/L   Potassium 4.4 3.5 - 5.1 mmol/L   Chloride 99 (L) 101 - 111 mmol/L   CO2 18 (L) 22 - 32 mmol/L   Glucose, Bld 140 (H) 65 - 99 mg/dL   BUN 15 6 - 20 mg/dL   Creatinine, Ser 1.19 0.61 - 1.24 mg/dL   Calcium 8.8 (L) 8.9 - 10.3 mg/dL   GFR calc non Af Amer >60 >60 mL/min   GFR calc Af Amer >60 >60 mL/min    Comment: (NOTE) The eGFR has been calculated using the CKD EPI equation. This calculation has not been validated in all  clinical situations. eGFR's persistently <60 mL/min signify possible Chronic Kidney Disease.    Anion gap 13 5 - 15  CBC with Differential/Platelet     Status: None   Collection Time: 11/21/15  7:40 PM  Result Value Ref Range   WBC 8.1 4.0 - 10.5 K/uL   RBC 4.48 4.22 - 5.81 MIL/uL   Hemoglobin 13.6 13.0 - 17.0 g/dL   HCT 40.6 39.0 - 52.0 %   MCV 90.6 78.0 - 100.0 fL   MCH 30.4 26.0 - 34.0 pg   MCHC 33.5 30.0 - 36.0 g/dL   RDW 13.1 11.5 - 15.5 %   Platelets 268 150 - 400 K/uL   Neutrophils Relative % 76 %   Neutro Abs 6.2 1.7 - 7.7 K/uL   Lymphocytes Relative 16 %   Lymphs Abs 1.3 0.7 - 4.0 K/uL   Monocytes Relative 4 %   Monocytes Absolute 0.3 0.1 - 1.0 K/uL   Eosinophils Relative 3 %   Eosinophils Absolute 0.2 0.0 - 0.7 K/uL   Basophils Relative 1 %   Basophils Absolute 0.1 0.0 - 0.1 K/uL  Ethanol     Status: Abnormal   Collection Time: 11/21/15  7:40 PM  Result Value Ref Range   Alcohol, Ethyl (B) 225 (H) <5 mg/dL    Comment:        LOWEST DETECTABLE LIMIT FOR SERUM ALCOHOL IS 5 mg/dL FOR MEDICAL PURPOSES ONLY   MRSA PCR Screening     Status: None   Collection Time: 11/22/15 12:10 AM  Result Value Ref Range   MRSA by PCR NEGATIVE NEGATIVE    Comment:        The GeneXpert MRSA Assay (FDA  approved for NASAL specimens only), is one component of a comprehensive MRSA colonization surveillance program. It is not intended to diagnose MRSA infection nor to guide or monitor treatment for MRSA infections.   Urine rapid drug screen (hosp performed)     Status: None   Collection Time: 11/22/15 12:20 AM  Result Value Ref Range   Opiates NONE DETECTED NONE DETECTED   Cocaine NONE DETECTED NONE DETECTED   Benzodiazepines NONE DETECTED NONE DETECTED   Amphetamines NONE DETECTED NONE DETECTED   Tetrahydrocannabinol NONE DETECTED NONE DETECTED   Barbiturates NONE DETECTED NONE DETECTED    Comment:        DRUG SCREEN FOR MEDICAL PURPOSES ONLY.  IF CONFIRMATION IS  NEEDED FOR ANY PURPOSE, NOTIFY LAB WITHIN 5 DAYS.        LOWEST DETECTABLE LIMITS FOR URINE DRUG SCREEN Drug Class       Cutoff (ng/mL) Amphetamine      1000 Barbiturate      200 Benzodiazepine   829 Tricyclics       937 Opiates          300 Cocaine          300 THC              50   Creatinine, urine, random     Status: None   Collection Time: 11/22/15 12:20 AM  Result Value Ref Range   Creatinine, Urine 46.27 mg/dL    Comment: Performed at [facility]  Sodium, urine, random     Status: None   Collection Time: 11/22/15 12:20 AM  Result Value Ref Range   Sodium, Ur 11 mmol/L    Comment: Performed at [facility]  Osmolality, urine     Status: Abnormal   Collection Time: 11/22/15 12:20 AM  Result Value Ref Range   Osmolality, Ur 226 (L) 300 - 900 mOsm/kg    Comment: Performed at [facility]  Magnesium     Status: None   Collection Time: 11/22/15  3:58 AM  Result Value Ref Range   Magnesium 2.0 1.7 - 2.4 mg/dL  Phosphorus     Status: None   Collection Time: 11/22/15  3:58 AM  Result Value Ref Range   Phosphorus 3.5 2.5 - 4.6 mg/dL  TSH     Status: None   Collection Time: 11/22/15  3:58 AM  Result Value Ref Range   TSH 1.626 0.350 - 4.500 uIU/mL  Comprehensive metabolic panel     Status: Abnormal   Collection Time: 11/22/15  3:58 AM  Result Value Ref Range   Sodium 134 (L) 135 - 145 mmol/L   Potassium 3.7 3.5 - 5.1 mmol/L   Chloride 102 101 - 111 mmol/L   CO2 19 (L) 22 - 32 mmol/L   Glucose, Bld 108 (H) 65 - 99 mg/dL   BUN 14 6 - 20 mg/dL   Creatinine, Ser 1.06 0.61 - 1.24 mg/dL   Calcium 8.7 (L) 8.9 - 10.3 mg/dL   Total Protein 7.0 6.5 - 8.1 g/dL   Albumin 4.2 3.5 - 5.0 g/dL   AST 28 15 - 41 U/L   ALT 31 17 - 63 U/L   Alkaline Phosphatase 59 38 - 126 U/L   Total Bilirubin 0.5 0.3 - 1.2 mg/dL   GFR calc non Af Amer >60 >60 mL/min   GFR calc Af Amer >60 >60 mL/min    Comment: (NOTE) The eGFR has been calculated using the CKD EPI  equation. This  calculation has not been validated in all clinical situations. eGFR's persistently <60 mL/min signify possible Chronic Kidney Disease.    Anion gap 13 5 - 15  CBC     Status: None   Collection Time: 11/22/15  3:58 AM  Result Value Ref Range   WBC 7.5 4.0 - 10.5 K/uL   RBC 4.29 4.22 - 5.81 MIL/uL   Hemoglobin 13.4 13.0 - 17.0 g/dL   HCT 39.5 39.0 - 52.0 %   MCV 92.1 78.0 - 100.0 fL   MCH 31.2 26.0 - 34.0 pg   MCHC 33.9 30.0 - 36.0 g/dL   RDW 13.6 11.5 - 15.5 %   Platelets 253 150 - 400 K/uL  Protime-INR     Status: None   Collection Time: 11/22/15  3:58 AM  Result Value Ref Range   Prothrombin Time 13.9 11.6 - 15.2 seconds   INR 1.05 0.00 - 1.49    Current Facility-Administered Medications  Medication Dose Route Frequency Provider Last Rate Last Dose  . acetaminophen (TYLENOL) tablet 650 mg  650 mg Oral Q6H PRN Toy Baker, MD       Or  . acetaminophen (TYLENOL) suppository 650 mg  650 mg Rectal Q6H PRN Toy Baker, MD      . albuterol (PROVENTIL) (2.5 MG/3ML) 0.083% nebulizer solution 2.5 mg  2.5 mg Nebulization Q2H PRN Toy Baker, MD      . ALPRAZolam [name]) tablet 1 mg  1 mg Oral Daily PRN Toy Baker, MD      . citalopram (CELEXA) tablet 20 mg  20 mg Oral Daily Toy Baker, MD   20 mg at 11/22/15 0932  . doxycycline (VIBRA-TABS) tablet 100 mg  100 mg Oral Q12H Toy Baker, MD   100 mg at 11/22/15 0932  . enoxaparin (LOVENOX) injection 40 mg  40 mg Subcutaneous Q24H Toy Baker, MD   40 mg at 11/22/15 0933  . fluticasone (FLONASE) 50 MCG/ACT nasal spray 2 spray  2 spray Each Nare Daily Jeryl Columbia, NP      . Fluticasone Furoate-Vilanterol 100-25 MCG/INH AEPB 1 puff  1 puff Inhalation q morning - 10a Toy Baker, MD   1 puff at 11/22/15 0907  . folic acid (FOLVITE) tablet 1 mg  1 mg Oral Daily Toy Baker, MD   1 mg at 11/22/15 0933  . guaiFENesin (MUCINEX) 12 hr tablet 600 mg  600 mg Oral  BID Toy Baker, MD   600 mg at 11/22/15 0932  . HYDROcodone-acetaminophen (NORCO/VICODIN) 5-325 MG per tablet 1-2 tablet  1-2 tablet Oral Q4H PRN Toy Baker, MD      . ipratropium-albuterol (DUONEB) 0.5-2.5 (3) MG/3ML nebulizer solution 3 mL  3 mL Nebulization BID Rise Patience, MD   3 mL at 11/22/15 0900  . loratadine (CLARITIN) tablet 10 mg  10 mg Oral Daily Toy Baker, MD   10 mg at 11/22/15 0932  . LORazepam (ATIVAN) injection 2-3 mg  2-3 mg Intravenous Q1H PRN Toy Baker, MD   2 mg at 11/22/15 0443  . montelukast (SINGULAIR) tablet 10 mg  10 mg Oral QHS Toy Baker, MD   10 mg at 11/22/15 0225  . ondansetron (ZOFRAN) injection 4 mg  4 mg Intravenous Q8H PRN Leonard Schwartz, MD      . predniSONE (DELTASONE) tablet 40 mg  40 mg Oral Q breakfast Toy Baker, MD   40 mg at 11/22/15 0806  . sodium chloride flush (NS) 0.9 % injection 3 mL  3 mL Intravenous  Q12H Toy Baker, MD   3 mL at 11/22/15 0303  . tamsulosin (FLOMAX) capsule 0.4 mg  0.4 mg Oral QPC breakfast Toy Baker, MD   0.4 mg at 11/22/15 0806  . thiamine (VITAMIN B-1) tablet 100 mg  100 mg Oral Daily Toy Baker, MD   100 mg at 11/22/15 0932  . traMADol (ULTRAM) tablet 100 mg  100 mg Oral 3 times per day Toy Baker, MD   100 mg at 11/22/15 0607    Musculoskeletal: Strength & Muscle Tone: within normal limits Gait & Station: unable to stand Patient leans: N/A  Psychiatric Specialty Exam: ROS complaining of shakes, disturbed sleep, unable to work at home which leads to wean off drinking and relapsed recently. Patient denies current symptoms of shortness of breath, chest pain, nausea and vomiting. No Fever-chills, No Headache, No changes with Vision or hearing, reports vertigo No problems swallowing food or Liquids, No Chest pain, Cough or Shortness of Breath, No Abdominal pain, No Nausea or Vommitting, Bowel movements are regular, No Blood in stool or  Urine, No dysuria, No new skin rashes or bruises, No new joints pains-aches,  No new weakness, tingling, numbness in any extremity, No recent weight gain or loss, No polyuria, polydypsia or polyphagia,   A full 10 point Review of Systems was done, except as stated above, all other Review of Systems were negative.  Blood pressure 120/78, pulse 81, temperature 97.5 F (36.4 C), temperature source Oral, resp. rate 20, height 6' (1.829 m), weight 96.163 kg (212 lb), SpO2 96 %.Body mass index is 28.75 kg/(m^2).  General Appearance: Casual  Eye Contact::  Good  Speech:  Clear and Coherent  Volume:  Normal  Mood:  Anxious and Depressed  Affect:  Constricted and Depressed  Thought Process:  Coherent and Goal Directed  Orientation:  Full (Time, Place, and Person)  Thought Content:  Rumination  Suicidal Thoughts:  No  Homicidal Thoughts:  No  Memory:  Immediate;   Fair Recent;   Fair  Judgement:  Fair  Insight:  Fair  Psychomotor Activity:  Decreased  Concentration:  Good  Recall:  Good  Fund of Knowledge:Good  Language: Good  Akathisia:  Negative  Handed:  Right  AIMS (if indicated):     Assets:  Communication Skills Desire for Improvement Financial Resources/Insurance Housing Intimacy Leisure Time Resilience Social Support Talents/Skills Transportation  ADL's:  Intact  Cognition: WNL  Sleep:      Treatment Plan Summary: Daily contact with patient to assess and evaluate symptoms and progress in treatment and Medication management  Monitor for alcohol withdrawal symptoms and CIWA Ativan protocol for alcohol withdrawal symptoms We start trazodone 50 mg at bedtime for insomnia Chemical dependency intensive outpatient program here at behavioral health Hospital or [place].  Disposition: Patient does not meet criteria for psychiatric inpatient admission. Supportive therapy provided about ongoing stressors. Refer to IOP.  Durward Parcel., MD 11/22/2015  9:50 AM

## 2015-11-22 NOTE — Care Management Note (Signed)
Case Management Note  Patient Details  Name: SEIYA BORKE MRN: [id number] Date of Birth: 02-Mar-1951  Subjective/Objective:        etoh withdrawal            Action/Plan: Date: November 22, 2015 Chart reviewed for concurrent status and case management needs. Will continue to follow patient for changes and needs: Velva Harman, BSN, RN, [place]   [phone]  Expected Discharge Date:                  Expected Discharge Plan:  Home/Self Care  In-House Referral:  Clinical Social Work  Discharge planning Services  CM Consult  Post Acute Care Choice:  NA Choice offered to:  NA  DME Arranged:    DME Agency:     HH Arranged:    [place] Agency:     Status of Service:  Completed, signed off  Medicare Important Message Given:    Date Medicare IM Given:    Medicare IM give by:    Date Additional Medicare IM Given:    Additional Medicare Important Message give by:     If discussed at [place] of Stay Meetings, dates discussed:    Additional Comments:  Leeroy Cha, RN 11/22/2015, 9:46 AM

## 2015-11-22 NOTE — Telephone Encounter (Signed)
Per chart, pt was brought to the [facility], later transferred to [facility] and admitted.

## 2015-11-23 DIAGNOSIS — J441 Chronic obstructive pulmonary disease with (acute) exacerbation: Secondary | ICD-10-CM

## 2015-11-23 DIAGNOSIS — R45851 Suicidal ideations: Secondary | ICD-10-CM

## 2015-11-23 DIAGNOSIS — F1014 Alcohol abuse with alcohol-induced mood disorder: Secondary | ICD-10-CM

## 2015-11-23 LAB — HEMOGLOBIN A1C
HEMOGLOBIN A1C: 6.8 % — AB (ref 4.8–5.6)
MEAN PLASMA GLUCOSE: 148 mg/dL

## 2015-11-23 MED ORDER — DOXYCYCLINE HYCLATE 100 MG PO TABS: 100.0000 mg | ORAL_TABLET | Freq: Two times a day (BID) | ORAL | Status: DC

## 2015-11-23 MED ORDER — GUAIFENESIN ER 600 MG PO TB12: 600.0000 mg | ORAL_TABLET | Freq: Two times a day (BID) | ORAL | Status: DC

## 2015-11-23 MED ORDER — PREDNISONE 10 MG PO TABS: ORAL_TABLET | ORAL | Status: DC

## 2015-11-23 NOTE — Progress Notes (Signed)
Patient Saturations on Room Air at Rest =  96 %  Patient Saturations on Room Air while Ambulating = 92 %   

## 2015-11-23 NOTE — Discharge Summary (Signed)
Physician Discharge Summary  [name] [id number] DOB: 27-Oct-1950 DOA: 11/21/2015  PCP: Leeanne Rio, PA-C  Admit date: 11/21/2015 Discharge date: 11/23/2015  Time spent: 35 minutes  Recommendations for Outpatient Follow-up:  Outpatient psych referral made  Discharge Diagnoses:  Principal Problem:   Alcohol abuse with alcohol-induced mood disorder ([place]) Active Problems:   Hypertension   Alcoholism ([place])   OSA (obstructive sleep apnea)   Anxiety   Suicidal thoughts   Alcohol withdrawal ([place])   COPD exacerbation ([place])   Alcohol withdrawal delirium ([place])   Discharge Condition: improved  Diet recommendation: cardiac  Filed Weights   11/21/15 1837  Weight: 96.163 kg (212 lb)    History of present illness:  [name] is a 65 y.o. male   has a past medical history of Hypertension; GERD (gastroesophageal reflux disease); History of TB (tuberculosis); Post-polio syndrome; History of chronic bronchitis; History of rheumatic fever; Bladder outlet obstruction; Anxiety disorder; History of urinary retention; Nocturnal oxygen desaturation; Complication of anesthesia; Diabetes mellitus without complication ([place]); Anxiety; Dysrhythmia; Arthritis; History of oxygen administration; COPD, frequent exacerbations ([place]); PONV (postoperative nausea and vomiting); Family history of adverse reaction to anesthesia; Shortness of breath dyspnea; Pneumonia; multiple concussions; Depression; Schizophrenia ([place]); Prostate cancer ([place]); Prostate cancer ([place]); and Melanoma ([place]).   Presented with increased shortness of breath and some wheezing 2 weeks . patient states that he hasn't been eating well for the past few weeks and has been having some dry mouth. He has been drinking usually every day but hasn't been drinking regular basis for past 2 weeks until today. He usually drinks about 12 pack of beer per day but has been cutting down. Tryin to wean himself off. Reports increase in  anxiety shortness of breath has been gradually in onset.  IN ER: Initially presented to med [facility] on emergency department chest x-ray showed no evidence of infiltrate. Patient was given Ativan 1 mg IV 2 and then 2 mg, Haldol 1 mg for presumed withdrawal symptoms given on anxiety patient expressed suicidal ideations without any plan  Hospital Course:  COPD exacerbation -mild -steroid taper PO abx  Suicidal ideations -seen by psych- does not meet criteria for inpt -outpatient referral given  Procedures:    Consultations:  psych  Discharge Exam: Filed Vitals:   11/23/15 0530 11/23/15 1156  BP: 122/63   Pulse: 85 108  Temp: 97.3 F (36.3 C)   Resp: 20     General: awake, pleasant- talking about all the things he has to do at home Cardiovascular: rrr Respiratory: no wheezing  Discharge Instructions    Current Discharge Medication List    START taking these medications   Details  doxycycline (VIBRA-TABS) 100 MG tablet Take 1 tablet (100 mg total) by mouth every 12 (twelve) hours. Qty: 6 tablet, Refills: 0    guaiFENesin (MUCINEX) 600 MG 12 hr tablet Take 1 tablet (600 mg total) by mouth 2 (two) times daily. Qty: 15 tablet, Refills: 0    polyethylene glycol (MIRALAX / GLYCOLAX) packet Take 17 g by mouth 2 (two) times daily. Qty: 20 each, Refills: 0    predniSONE (DELTASONE) 10 MG tablet 40 mg x 3 days, 30 mg x 3 days, 20 mg x 3 days, 10 mg x 3 days and then go back to what Dr. Elsworth Soho had you on Qty: 30 tablet, Refills: 0      CONTINUE these medications which have NOT CHANGED   Details  albuterol (PROVENTIL HFA;VENTOLIN HFA) 108 (90  Base) MCG/ACT inhaler Inhale 2 puffs into the lungs every 6 (six) hours as needed for wheezing or shortness of breath. Qty: 3 Inhaler, Refills: 3    ALPRAZolam (XANAX) 1 MG tablet Take 0.5 tablets (0.5 mg total) by mouth 3 (three) times daily as needed for anxiety. Qty: 90 tablet, Refills: 0   Associated Diagnoses:  Anxiety    amLODipine (NORVASC) 10 MG tablet Take 1 tablet (10 mg total) by mouth daily. Qty: 90 tablet, Refills: 1    dexlansoprazole (DEXILANT) 60 MG capsule Take 1 capsule (60 mg total) by mouth daily. Qty: 90 capsule, Refills: 1   Associated Diagnoses: Gastroesophageal reflux disease without esophagitis    diphenhydrAMINE (SOMINEX) 25 MG tablet Take 25 mg by mouth daily as needed for itching or sleep.    fluticasone (FLONASE) 50 MCG/ACT nasal spray Place 2 sprays into both nostrils daily. Qty: 16 g, Refills: 6    Fluticasone Furoate-Vilanterol (BREO ELLIPTA) 100-25 MCG/INH AEPB INHALE 1 PUFF INTO THE LUNGS EVERY MORNING. DAILY Qty: 180 each, Refills: 0    ipratropium-albuterol (DUONEB) 0.5-2.5 (3) MG/3ML SOLN Take 3 mLs by nebulization every 4 (four) hours as needed (shortness of breath and wheezing).    loratadine (CLARITIN) 10 MG tablet Take 10 mg by mouth daily as needed for allergies.  Refills: 5    metoprolol succinate (TOPROL-XL) 25 MG 24 hr tablet Take 1 tablet (25 mg total) by mouth daily. Qty: 90 tablet, Refills: 1    montelukast (SINGULAIR) 10 MG tablet TAKE 1 TABLET (10 MG TOTAL) BY MOUTH AT BEDTIME. Qty: 90 tablet, Refills: 1    nitroGLYCERIN (NITROSTAT) 0.4 MG SL tablet Place 1 tablet (0.4 mg total) under the tongue every 5 (five) minutes as needed for chest pain. Qty: 30 tablet, Refills: 3    ondansetron (ZOFRAN ODT) 4 MG disintegrating tablet Take 1 tablet (4 mg total) by mouth every 8 (eight) hours as needed for nausea. Qty: 20 tablet, Refills: 0    oxyCODONE (ROXICODONE) 15 MG immediate release tablet Take 15 mg by mouth every 8 (eight) hours as needed. for pain Refills: 0    OXYGEN Inhale 2 L into the lungs at bedtime.    phenazopyridine (PYRIDIUM) 200 MG tablet Take 200 mg by mouth 3 (three) times daily as needed for pain (pt states it bothers his stomach but he still takes it when needed).    promethazine (PHENERGAN) 25 MG tablet Take 25 mg by mouth  every 6 (six) hours as needed for nausea or vomiting.    tiotropium (SPIRIVA) 18 MCG inhalation capsule Place 1 capsule (18 mcg total) into inhaler and inhale daily. IN AM Qty: 90 capsule, Refills: 1    traMADol (ULTRAM-ER) 300 MG 24 hr tablet Take 1 tablet (300 mg total) by mouth daily. Qty: 30 tablet, Refills: 0       Allergies  Allergen Reactions  . Ivp Dye [Iodinated Diagnostic Agents] Anaphylaxis  . Levaquin [Levofloxacin In D5w] Shortness Of Breath and Swelling    In addition: sweating, chest pain, and diarrhea.   . Penicillins Anaphylaxis    Heart stops Has patient had a PCN reaction causing immediate rash, facial/tongue/throat swelling, SOB or lightheadedness with hypotension: yes Has patient had a PCN reaction causing severe rash involving mucus membranes or skin necrosis: yes Has patient had a PCN reaction that required hospitalization yes Has patient had a PCN reaction occurring within the last 10 years: No If all of the above answers are "NO", then may proceed with Cephalosporin  use.   . Aspirin Other (See Comments)    Reaction unknown  . Nsaids Other (See Comments)    Difficulty breathing  . Tolmetin Other (See Comments)    Difficulty breathing  . Buprenorphine Hcl Nausea And Vomiting    Can take with zofran   . Morphine And Related Nausea And Vomiting    Can take with zofran    Follow-up Information    Follow up with Leeanne Rio, PA-C.   Specialty:  Family Medicine   Contact information:   [address] [place] [address] [phone]        The results of significant diagnostics from this hospitalization (including imaging, microbiology, ancillary and laboratory) are listed below for reference.    Significant Diagnostic Studies: Dg Chest 2 View  10/26/2015  CLINICAL DATA:  Bladder and prostate surgery yesterday. Now complaining of dizziness, cough and shortness of breath. EXAM: CHEST  2 VIEW COMPARISON:  09/02/2015 FINDINGS:  The cardiac silhouette, mediastinal and hilar contours are within normal limits and stable. Streaky left basilar density is likely atelectasis. No pleural effusion or worrisome pulmonary lesions. The bony thorax is intact. Surgical changes involving both distal clavicles. IMPRESSION: Left basilar densities most likely reflecting postoperative atelectasis. No definite infiltrates or effusions. Electronically Signed   By: Marijo Sanes M.D.   On: 10/26/2015 18:55   Dg Cervical Spine Complete  11/06/2015  CLINICAL DATA:  Status post fall 10 hours ago, with throbbing pain at the neck. Initial encounter. EXAM: CERVICAL SPINE - COMPLETE 4+ VIEW COMPARISON:  CT of the cervical spine performed 05/18/2015 FINDINGS: There is no evidence of fracture or subluxation. Vertebral bodies demonstrate normal height and alignment. Intervertebral disc spaces are preserved. Scattered small anterior and posterior disc osteophyte complexes are noted along the lower cervical spine. Prevertebral soft tissues are within normal limits. The provided odontoid view demonstrates no significant abnormality. The visualized lung apices are clear. IMPRESSION: No evidence of fracture or subluxation along the cervical spine. Electronically Signed   By: Garald Balding M.D.   On: 11/06/2015 23:57   Dg Wrist Complete Right  11/06/2015  CLINICAL DATA:  Status post fall 10 hours ago, with right wrist pain and difficulty grasping with the right hand. Initial encounter. EXAM: RIGHT WRIST - COMPLETE 3+ VIEW COMPARISON:  None. FINDINGS: There appears to be widening of the scapholunate distance to 4-5 mm, raising concern for very mild scapholunate dissociation. Would correlate for associated symptoms. There is no definite evidence of fracture or dislocation. Minimal degenerative change is noted at the first carpometacarpal joint. No definite soft tissue abnormalities are characterized on radiograph. IMPRESSION: 1. No definite evidence of fracture or  dislocation. 2. Apparent widening of the scapholunate distance to 4-5 mm, raising concern for very mild scapholunate dissociation. Would correlate for associated symptoms. Electronically Signed   By: Garald Balding M.D.   On: 11/06/2015 23:56   Dg Chest Portable 1 View  11/21/2015  CLINICAL DATA:  Shortness of breath with central chest pain for 2 weeks. EXAM: PORTABLE CHEST 1 VIEW COMPARISON:  10/26/2015. FINDINGS: The heart size and mediastinal contours are within normal limits. Both lungs are clear. The visualized skeletal structures are unremarkable. Mild LEFT base scarring or atelectasis is improved from recent priors. IMPRESSION: No active infiltrates or failure. Mild LEFT base scarring or atelectasis is improved. Electronically Signed   By: Staci Righter M.D.   On: 11/21/2015 20:53    Microbiology: Recent Results (from the past 240  hour(s))  MRSA PCR Screening     Status: None   Collection Time: 11/22/15 12:10 AM  Result Value Ref Range Status   MRSA by PCR NEGATIVE NEGATIVE Final    Comment:        The GeneXpert MRSA Assay (FDA approved for NASAL specimens only), is one component of a comprehensive MRSA colonization surveillance program. It is not intended to diagnose MRSA infection nor to guide or monitor treatment for MRSA infections.      Labs: Basic Metabolic Panel:  Recent Labs Lab 11/21/15 1940 11/22/15 0358  NA 130* 134*  K 4.4 3.7  CL 99* 102  CO2 18* 19*  GLUCOSE 140* 108*  BUN 15 14  CREATININE 1.19 1.06  CALCIUM 8.8* 8.7*  MG  --  2.0  PHOS  --  3.5   Liver Function Tests:  Recent Labs Lab 11/22/15 0358  AST 28  ALT 31  ALKPHOS 59  BILITOT 0.5  PROT 7.0  ALBUMIN 4.2   No results for input(s): LIPASE, AMYLASE in the last 168 hours. No results for input(s): AMMONIA in the last 168 hours. CBC:  Recent Labs Lab 11/21/15 1940 11/22/15 0358  WBC 8.1 7.5  NEUTROABS 6.2  --   HGB 13.6 13.4  HCT 40.6 39.5  MCV 90.6 92.1  PLT 268 253    Cardiac Enzymes: No results for input(s): CKTOTAL, CKMB, CKMBINDEX, TROPONINI in the last 168 hours. BNP: BNP (last 3 results) No results for input(s): BNP in the last 8760 hours.  ProBNP (last 3 results) No results for input(s): PROBNP in the last 8760 hours.  CBG: No results for input(s): GLUCAP in the last 168 hours.     Signed:  Geradine Girt DO.  [facility] 11/23/2015, 12:27 PM

## 2015-11-23 NOTE — Progress Notes (Signed)
CSW consulted to assist with d/c planning. Pt seen by psych on 11/22/15 and MD recommended IOP for chemical dependency at d/c. CSW met with pt / spouse to provide resources. CSW attempted to refer pt to [facility] for IOP. Pt's name and  telephone # was with intake. Pt was provided with program # and encouraged to call again if he did not here back from program director by tomorrow. Additional SA resources were provided in case needed. Pt / spouse were comfortable with this plan. Pt is being dc this afternoon.  Werner Lean LCSW [phone]

## 2015-11-24 ENCOUNTER — Telehealth: Payer: Self-pay | Admitting: Behavioral Health

## 2015-11-24 NOTE — Telephone Encounter (Signed)
Attempted to follow-up with the patient regarding his recent hospitalization. Unable to reach the patient at this time. Left message for a call back.

## 2015-11-25 ENCOUNTER — Telehealth: Payer: Self-pay | Admitting: Physician Assistant

## 2015-11-25 ENCOUNTER — Telehealth: Payer: Self-pay | Admitting: Pulmonary Disease

## 2015-11-25 ENCOUNTER — Ambulatory Visit (INDEPENDENT_AMBULATORY_CARE_PROVIDER_SITE_OTHER): Payer: Medicare Other | Admitting: Adult Health

## 2015-11-25 ENCOUNTER — Encounter: Payer: Self-pay | Admitting: Adult Health

## 2015-11-25 VITALS — BP 126/80 | HR 56 | Temp 98.0°F | Ht 72.0 in | Wt 215.0 lb

## 2015-11-25 DIAGNOSIS — G4733 Obstructive sleep apnea (adult) (pediatric): Secondary | ICD-10-CM | POA: Diagnosis not present

## 2015-11-25 DIAGNOSIS — J449 Chronic obstructive pulmonary disease, unspecified: Secondary | ICD-10-CM | POA: Diagnosis not present

## 2015-11-25 NOTE — Telephone Encounter (Signed)
Called patient x 2 for TCM/Hospital Follow-up. Unable to reach the patient at this time. Left message for the patient to return call when available.

## 2015-11-25 NOTE — Telephone Encounter (Signed)
error:[id number] ° °

## 2015-11-25 NOTE — Telephone Encounter (Signed)
LMTCB x 1 

## 2015-11-25 NOTE — Patient Instructions (Addendum)
Begin CPAP At bedtime . CPAP download in 1 month .  Goal is to wear for 4-6 hr each night .  Do not drive if sleepy.  Work on weight loss .   Stop Spiriva for now .  Continue on BREO daily  Follow up Dr. Elsworth Soho  In 2 months and As needed   Please contact office for sooner follow up if symptoms do not improve or worsen or seek emergency care

## 2015-11-28 NOTE — Telephone Encounter (Signed)
Left message for patient to call back  

## 2015-11-29 NOTE — Telephone Encounter (Signed)
Pt has not heard from [facility] regarding starting CPAP.  Aware that we will call him back as soon as we know something regarding his CPAP and also with a follow up appt. Called [facility], LM with answering service to return call regarding CPAP order.  Please advise [name] where this patient can be worked in to see RA for 2 month ROV CPAP compliance. Thanks.

## 2015-11-29 NOTE — Telephone Encounter (Signed)
Left message for patient to call back  

## 2015-11-30 ENCOUNTER — Ambulatory Visit: Payer: Medicare Other | Admitting: Physician Assistant

## 2015-11-30 ENCOUNTER — Telehealth: Payer: Self-pay | Admitting: Physician Assistant

## 2015-11-30 NOTE — Telephone Encounter (Signed)
Patient cancel 11:30am appointment for today due to car issues will call back to [facility] Charge or no charge

## 2015-11-30 NOTE — Telephone Encounter (Signed)
No charge. 

## 2015-12-01 NOTE — Telephone Encounter (Signed)
Pt is aware that [facility] will be calling him today. Nothing further was needed.

## 2015-12-01 NOTE — Telephone Encounter (Signed)
Patient returned call, CB [phone].  Asks if he does not answer, please leave detailed msg on VM.

## 2015-12-01 NOTE — Telephone Encounter (Signed)
Called spoke with Jeani Hawking at [facility]. She reports she gave order to RT and they will be calling him today. Called pt and LMTCB x1

## 2015-12-02 ENCOUNTER — Ambulatory Visit (INDEPENDENT_AMBULATORY_CARE_PROVIDER_SITE_OTHER): Payer: Medicare Other | Admitting: Physician Assistant

## 2015-12-02 ENCOUNTER — Telehealth: Payer: Self-pay | Admitting: Physician Assistant

## 2015-12-02 DIAGNOSIS — Z5321 Procedure and treatment not carried out due to patient leaving prior to being seen by health care provider: Secondary | ICD-10-CM

## 2015-12-02 NOTE — Telephone Encounter (Signed)
Patient should have informed front desk staff of symptoms. If we would have been aware it would have been assessed immediately. Driving while dizzy is never a wise choice. Please call to assess patient. He needs ER assessment if symptoms persisting.

## 2015-12-02 NOTE — Progress Notes (Signed)
Patient left without being seen.

## 2015-12-02 NOTE — Progress Notes (Signed)
Pre visit review using our clinic review tool, if applicable. No additional management support is needed unless otherwise documented below in the visit note. 

## 2015-12-02 NOTE — Telephone Encounter (Signed)
Patient checked in at 11:01 for an 11:30 appointment with [name]. At approximately 11:25 Angie came looking for the patient and he was not in the lobby. I called patient to see where he was and was informed by patient that he had gotten very dizzy while waiting to be seen. Stated that he went outside to get some air but started to feel worse. Stated that he tried to come back in but couldn't and was currently in his car trying to make it home. Tried to get him to hold on the line so that I could transfer him to [place] but he stated again that he was trying to make it home and he either hung up or was disconnected.  Informed Angie of the conversation. Attempted to call patient after speaking with Angie but did not get an answer.

## 2015-12-02 NOTE — Telephone Encounter (Signed)
Called patient to follow up with him. He says that he is currently home.  He apologized for leaving.  Says that he continues to have a headache.  Rated 8/10.  Ringing in ears.  Dizziness whenever he bends down.  Denies any other symptoms.  States he just has a really bad headache----self treating with tylenol and cold compresses.  Pt was advised to go to ER.  He agrees to comply once his wife comes home at 5:30pm.  Pt was advised to go sooner since symptoms persists.  He declined.  He was advised if symptoms worsen or new symptoms develop to call EMS.  Pt agreed and said he would.

## 2015-12-03 ENCOUNTER — Encounter (HOSPITAL_BASED_OUTPATIENT_CLINIC_OR_DEPARTMENT_OTHER): Payer: Self-pay | Admitting: *Deleted

## 2015-12-03 ENCOUNTER — Emergency Department (HOSPITAL_BASED_OUTPATIENT_CLINIC_OR_DEPARTMENT_OTHER): Payer: Medicare Other

## 2015-12-03 ENCOUNTER — Emergency Department (HOSPITAL_BASED_OUTPATIENT_CLINIC_OR_DEPARTMENT_OTHER)
Admission: EM | Admit: 2015-12-03 | Discharge: 2015-12-03 | Disposition: A | Discharge status: Home / Self Care | Payer: Medicare Other | Attending: Emergency Medicine | Admitting: Emergency Medicine

## 2015-12-03 DIAGNOSIS — Z87891 Personal history of nicotine dependence: Secondary | ICD-10-CM | POA: Insufficient documentation

## 2015-12-03 DIAGNOSIS — Z8701 Personal history of pneumonia (recurrent): Secondary | ICD-10-CM | POA: Insufficient documentation

## 2015-12-03 DIAGNOSIS — K219 Gastro-esophageal reflux disease without esophagitis: Secondary | ICD-10-CM | POA: Insufficient documentation

## 2015-12-03 DIAGNOSIS — Z8546 Personal history of malignant neoplasm of prostate: Secondary | ICD-10-CM | POA: Diagnosis not present

## 2015-12-03 DIAGNOSIS — I10 Essential (primary) hypertension: Secondary | ICD-10-CM | POA: Insufficient documentation

## 2015-12-03 DIAGNOSIS — Z79899 Other long term (current) drug therapy: Secondary | ICD-10-CM | POA: Insufficient documentation

## 2015-12-03 DIAGNOSIS — Z8612 Personal history of poliomyelitis: Secondary | ICD-10-CM | POA: Insufficient documentation

## 2015-12-03 DIAGNOSIS — F209 Schizophrenia, unspecified: Secondary | ICD-10-CM | POA: Diagnosis not present

## 2015-12-03 DIAGNOSIS — Z880 Allergy status to penicillin: Secondary | ICD-10-CM | POA: Insufficient documentation

## 2015-12-03 DIAGNOSIS — Z7951 Long term (current) use of inhaled steroids: Secondary | ICD-10-CM | POA: Diagnosis not present

## 2015-12-03 DIAGNOSIS — Z8611 Personal history of tuberculosis: Secondary | ICD-10-CM | POA: Insufficient documentation

## 2015-12-03 DIAGNOSIS — M1990 Unspecified osteoarthritis, unspecified site: Secondary | ICD-10-CM | POA: Diagnosis not present

## 2015-12-03 DIAGNOSIS — H8110 Benign paroxysmal vertigo, unspecified ear: Secondary | ICD-10-CM

## 2015-12-03 DIAGNOSIS — R42 Dizziness and giddiness: Secondary | ICD-10-CM | POA: Diagnosis not present

## 2015-12-03 DIAGNOSIS — J449 Chronic obstructive pulmonary disease, unspecified: Secondary | ICD-10-CM | POA: Insufficient documentation

## 2015-12-03 DIAGNOSIS — Z85820 Personal history of malignant melanoma of skin: Secondary | ICD-10-CM | POA: Insufficient documentation

## 2015-12-03 DIAGNOSIS — Z87828 Personal history of other (healed) physical injury and trauma: Secondary | ICD-10-CM | POA: Diagnosis not present

## 2015-12-03 LAB — LIPASE, BLOOD: Lipase: 45 U/L (ref 11–51)

## 2015-12-03 LAB — URINALYSIS, ROUTINE W REFLEX MICROSCOPIC
BILIRUBIN URINE: NEGATIVE
Glucose, UA: NEGATIVE mg/dL
Ketones, ur: NEGATIVE mg/dL
Nitrite: NEGATIVE
Protein, ur: 30 mg/dL — AB
SPECIFIC GRAVITY, URINE: 1.023 (ref 1.005–1.030)
pH: 5 (ref 5.0–8.0)

## 2015-12-03 LAB — URINE MICROSCOPIC-ADD ON

## 2015-12-03 LAB — CBC
HCT: 42.6 % (ref 39.0–52.0)
HEMOGLOBIN: 14.3 g/dL (ref 13.0–17.0)
MCH: 30.6 pg (ref 26.0–34.0)
MCHC: 33.6 g/dL (ref 30.0–36.0)
MCV: 91.2 fL (ref 78.0–100.0)
PLATELETS: 217 10*3/uL (ref 150–400)
RBC: 4.67 MIL/uL (ref 4.22–5.81)
RDW: 13.8 % (ref 11.5–15.5)
WBC: 5.5 10*3/uL (ref 4.0–10.5)

## 2015-12-03 LAB — BASIC METABOLIC PANEL
Anion gap: 8 (ref 5–15)
BUN: 12 mg/dL (ref 6–20)
CHLORIDE: 105 mmol/L (ref 101–111)
CO2: 26 mmol/L (ref 22–32)
CREATININE: 1.11 mg/dL (ref 0.61–1.24)
Calcium: 9 mg/dL (ref 8.9–10.3)
GFR calc Af Amer: >60 mL/min (ref >60)
GFR calc non Af Amer: >60 mL/min (ref >60)
Glucose, Bld: 94 mg/dL (ref 65–99)
Potassium: 3.8 mmol/L (ref 3.5–5.1)
SODIUM: 139 mmol/L (ref 135–145)

## 2015-12-03 MED ORDER — MECLIZINE HCL 25 MG PO TABS
25.0000 mg | ORAL_TABLET | Freq: Once | ORAL | Status: AC
  Administered 2015-12-03: 25 mg via ORAL
  Filled 2015-12-03: qty 1

## 2015-12-03 MED ORDER — MECLIZINE HCL 25 MG PO TABS: 25.0000 mg | ORAL_TABLET | Freq: Three times a day (TID) | ORAL | Status: DC | PRN

## 2015-12-03 MED ORDER — ONDANSETRON HCL 4 MG/2ML IJ SOLN
4.0000 mg | Freq: Once | INTRAMUSCULAR | Status: DC | PRN
  Filled 2015-12-03: qty 2

## 2015-12-03 NOTE — ED Notes (Signed)
Pt reports popping sensation in head on rt temporal area yesterday, does have hx of anxiety/etoh abuse-recent hospilzation/. Was seeing his pcp yet left before being seen due to increase anxiety, stayed home, slept, woke up with same headache in temporal area. Wife reports giving the pt pieces of xanax's to help calm his anxiety.

## 2015-12-03 NOTE — ED Notes (Signed)
Pt reports he was bending over to pick up a log yesterday and "felt a pop in his head"- states he feels dizzy when he bends over

## 2015-12-03 NOTE — ED Provider Notes (Signed)
CSN: [id number]     Arrival date & time 12/03/15  1548 History  By signing my name below, I, [name], attest that this documentation has been prepared under the direction and in the presence of Alfonzo Beers, MD. Electronically Signed: [name], ED Scribe. 12/03/2015. 4:44 PM.     Chief Complaint  Patient presents with  . Dizziness   Patient is a 65 y.o. male presenting with dizziness. The history is provided by the patient. No language interpreter was used.  Dizziness Quality:  Unable to specify Severity:  Moderate Onset quality:  Sudden Duration:  1 day Timing:  Intermittent Progression:  Unchanged Chronicity:  New Context: physical activity   Relieved by:  Nothing Worsened by:  Eye movement and turning head Associated symptoms: headaches, tinnitus and vision changes    HPI Comments: [name] is a 65 y.o. male who presents to the Emergency Department complaining of intermittent dizziness onset 1 day prior. Pt reports that during the onset of his dizziness he was lifting something heavy. He states that as he was lifting the item her head a pop in his ears. Pt states that today he heard tinnitus in his bilateral ears. Pt reports HA as well. Pt states that the dizziness worse with movement of his head. He states that when moving his head his vision becomes "fuzzy." He states that he took a xanax with no relief.  No focal weakness.  No changes in vision.  No fainting.  No vomiting.    Past Medical History  Diagnosis Date  . Hypertension   . GERD (gastroesophageal reflux disease)   . History of TB (tuberculosis)     1984--  hospitalized for 4 month treatment  . Post-polio syndrome     polio at age 54--PT WAS IN IRON LUNG; PT WAS IN W/C UNTIL AGE 28; STILL HAS WEAKNESS RIGHT SIDE  . History of chronic bronchitis   . History of rheumatic fever   . Bladder outlet obstruction   . Anxiety disorder   . History of urinary retention   . Nocturnal oxygen desaturation      USES O2 NIGHTLY  . Complication of anesthesia     DIFFICULT WAKING   . Diabetes mellitus without complication ([place])     BODERLINE - DIET CONTROL  . Anxiety   . Dysrhythmia     PVC'S  . Arthritis     BILATERAL SHOULDERS, ELBOWS AND HANDS AND LEFT HIP AND KNEES--HX CORTISONE SHOTS IN SHOULDERS, ELBOWS, HIP AND KNEES  . History of oxygen administration     oxygen use 2 l/m nasally at bedtime and exertional occasions  . COPD, frequent exacerbations ([place])     pulmologist-  dr Joya Gaskins--  Girtha Rm Stage C.04-25-15 recent COPD exacerbation-much improved now, after tx. in ER [facility].  Marland Kitchen PONV (postoperative nausea and vomiting)   . Family history of adverse reaction to anesthesia     father would wake up with agitation   . Shortness of breath dyspnea     RIGHT HEMIDIAPHRAGM ELEVATION - POST POLIO SYNDROME  . Pneumonia     hx of   . Hx of multiple concussions     x 2 per patient   . Depression   . Schizophrenia ([place])   . Prostate cancer ([place])   . Prostate cancer ([place])   . Melanoma [facility])    Past Surgical History  Procedure Laterality Date  . Other surgical history       Muscle & bone Graft/Polio  .  Cystoscopy w/ cystogram/  transrectal ultrasound prostate bx  03-22-2009  . Cardiovascular stress test  06-08-2014  dr Mare Ferrari    normal lexiscan study/  no ischemia/  not gated due to PAC's  . Ureterosopy stone extraction  2000  . Nasal septum surgery  2000  . Shoulder arthroscopy with open rotator cuff repair Bilateral 2013  &  1999    removal spurs and bursectomy  . Laparoscopic cholecystectomy  2013  . Prostate biopsy N/A 09/28/2014    Procedure: PROSTATE ULTRASOUND/BIOPSY;  Surgeon: [name], MD;  Location: [facility];  Service: Urology;  Laterality: N/A;  . Transurethral resection of prostate N/A 09/28/2014    Procedure: TRANSURETHRAL RESECTION OF THE PROSTATE (TURP);  Surgeon: [name], MD;  Location: [facility];  Service: Urology;  Laterality: N/A;  .  Esophagogastroduodenoscopy N/A 03/22/2015    Procedure: ESOPHAGOGASTRODUODENOSCOPY (EGD) with dilation;  Surgeon: [name], MD;  Location: [facility];  Service: Endoscopy;  Laterality: N/A;  . Savory dilation N/A 03/22/2015    Procedure: SAVORY DILATION;  Surgeon: [name], MD;  Location: [facility];  Service: Endoscopy;  Laterality: N/A;  . Colonoscopy N/A 05/03/2015    Procedure: COLONOSCOPY;  Surgeon: [name], MD;  Location: [facility];  Service: Endoscopy;  Laterality: N/A;  . Polio surgeries       14 polio surgeries   . Left elbow surgery       due to fracture   . Prostate biopsy N/A 10/25/2015    Procedure: PROSTATE BIOPSY AND ULTRASOUND;  Surgeon: [name], MD;  Location: [facility];  Service: Urology;  Laterality: N/A;  . Transurethral incision of bladder neck N/A 10/25/2015    Procedure:  TRANSURETHRAL INCISION OF BLADDER NECK;  Surgeon: [name], MD;  Location: [facility];  Service: Urology;  Laterality: N/A;  . Cystoscopy N/A 10/25/2015    Procedure: CYSTOSCOPY;  Surgeon: [name], MD;  Location: [facility];  Service: Urology;  Laterality: N/A;   Family History  Problem Relation Age of Onset  . Alzheimer's disease Father 4    Deceased  . Stomach cancer Father   . Heart attack Father   . Heart disease Father   . Skin cancer Mother     Facial-Living  . Alcohol abuse Sister     x2  . Mental illness Sister     x2  . Diabetes Maternal Aunt     x2  . Thyroid disease Maternal Aunt     x4  . Diabetes Maternal Uncle   . Tuberculosis Paternal Grandfather   . Tuberculosis Paternal Grandmother   . Alzheimer's disease Paternal Aunt   . Alzheimer's disease Paternal Uncle   . Colon cancer Neg Hx   . Colon polyps Neg Hx   . Crohn's disease Neg Hx   . Ulcerative colitis Neg Hx    Social History  Substance Use Topics  . Smoking status: Former Smoker -- 31 years    Quit date: 08/25/2013  . Smokeless tobacco: Former Systems developer  . Alcohol Use: Yes     Comment: daily     Review of Systems  HENT: Positive for tinnitus.   Neurological: Positive for dizziness and headaches.  All other systems reviewed and are negative.    Allergies  Ivp dye; Levaquin; Penicillins; Aspirin; Nsaids; Tolmetin; Buprenorphine hcl; and Morphine and related  Home Medications   Prior to Admission medications   Medication Sig Start Date End Date Taking? Authorizing Provider  albuterol (PROVENTIL HFA;VENTOLIN HFA)  108 (90 Base) MCG/ACT inhaler Inhale 2 puffs into the lungs every 6 (six) hours as needed for wheezing or shortness of breath. 10/20/15   Brunetta Jeans, PA-C  ALPRAZolam [name]) 1 MG tablet Take 0.5 tablets (0.5 mg total) by mouth 3 (three) times daily as needed for anxiety. Patient taking differently: Take 1 mg by mouth daily as needed for anxiety.  09/06/15   Brunetta Jeans, PA-C  amLODipine (NORVASC) 10 MG tablet Take 1 tablet (10 mg total) by mouth daily. 10/19/15   Brunetta Jeans, PA-C  dexlansoprazole (DEXILANT) 60 MG capsule Take 1 capsule (60 mg total) by mouth daily. 11/14/15   Brunetta Jeans, PA-C  diphenhydrAMINE (SOMINEX) 25 MG tablet Take 25 mg by mouth daily as needed for itching or sleep.    Historical Provider, MD  doxycycline (VIBRA-TABS) 100 MG tablet Take 1 tablet (100 mg total) by mouth every 12 (twelve) hours. 11/23/15   Geradine Girt, DO  fluticasone (FLONASE) 50 MCG/ACT nasal spray Place 2 sprays into both nostrils daily. Patient taking differently: Place 2 sprays into both nostrils daily as needed for allergies.  08/03/15   Brunetta Jeans, PA-C  Fluticasone Furoate-Vilanterol (BREO ELLIPTA) 100-25 MCG/INH AEPB INHALE 1 PUFF INTO THE LUNGS EVERY MORNING. DAILY 10/25/15   Brunetta Jeans, PA-C  guaiFENesin (MUCINEX) 600 MG 12 hr tablet Take 1 tablet (600 mg total) by mouth 2 (two) times daily. 11/23/15   Geradine Girt, DO  ipratropium-albuterol (DUONEB) 0.5-2.5 (3) MG/3ML SOLN Take 3 mLs by nebulization every 4 (four) hours as needed (shortness  of breath and wheezing).    Historical Provider, MD  loratadine (CLARITIN) 10 MG tablet Take 10 mg by mouth daily as needed for allergies.  08/01/15   Historical Provider, MD  meclizine (ANTIVERT) 25 MG tablet Take 1 tablet (25 mg total) by mouth 3 (three) times daily as needed. 12/03/15   Alfonzo Beers, MD  metoprolol succinate (TOPROL-XL) 25 MG 24 hr tablet Take 1 tablet (25 mg total) by mouth daily. 10/20/15   Brunetta Jeans, PA-C  montelukast (SINGULAIR) 10 MG tablet TAKE 1 TABLET (10 MG TOTAL) BY MOUTH AT BEDTIME. 10/20/15   Brunetta Jeans, PA-C  nitroGLYCERIN (NITROSTAT) 0.4 MG SL tablet Place 1 tablet (0.4 mg total) under the tongue every 5 (five) minutes as needed for chest pain. 06/21/15   Brunetta Jeans, PA-C  ondansetron (ZOFRAN ODT) 4 MG disintegrating tablet Take 1 tablet (4 mg total) by mouth every 8 (eight) hours as needed for nausea. 09/28/15   Tanna Furry, MD  oxyCODONE (ROXICODONE) 15 MG immediate release tablet Take 15 mg by mouth every 8 (eight) hours as needed. for pain 09/07/15   Historical Provider, MD  OXYGEN Inhale 2 L into the lungs at bedtime. Reported on 11/25/2015    Historical Provider, MD  phenazopyridine (PYRIDIUM) 200 MG tablet Take 200 mg by mouth 3 (three) times daily as needed for pain (pt states it bothers his stomach but he still takes it when needed). Reported on 11/25/2015    Historical Provider, MD  polyethylene glycol (MIRALAX / GLYCOLAX) packet Take 17 g by mouth 2 (two) times daily. 11/21/15   Leonard Schwartz, MD  predniSONE (DELTASONE) 10 MG tablet 40 mg x 3 days, 30 mg x 3 days, 20 mg x 3 days, 10 mg x 3 days and then go back to what Dr. Elsworth Soho had you on 11/23/15   Geradine Girt, DO  promethazine (PHENERGAN) 25 MG  tablet Take 25 mg by mouth every 6 (six) hours as needed for nausea or vomiting.    Historical Provider, MD  tiotropium (SPIRIVA) 18 MCG inhalation capsule Place 1 capsule (18 mcg total) into inhaler and inhale daily. IN AM Patient taking differently: Place  18 mcg into inhaler and inhale at bedtime.  05/05/15   Brunetta Jeans, PA-C  traMADol (ULTRAM-ER) 300 MG 24 hr tablet Take 1 tablet (300 mg total) by mouth daily. Patient not taking: Reported on 11/25/2015 11/09/15   Brunetta Jeans, PA-C   BP 124/67 mmHg  Pulse 66  Temp(Src) 98 F (36.7 C) (Oral)  Resp 18  Ht 6\' 1"  (1.854 m)  Wt 213 lb (96.616 kg)  BMI 28.11 kg/m2  SpO2 99%  Vitals reviewed Physical Exam  Physical Examination: General appearance - alert, well appearing, and in no distress Mental status - alert, oriented to person, place, and time Eyes - pupils equal and reactive, extraocular eye movements intact Mouth - mucous membranes moist, pharynx normal without lesions Neck - supple, no significant adenopathy Chest - clear to auscultation, no wheezes, rales or rhonchi, symmetric air entry Heart - normal rate, regular rhythm, normal S1, S2, no murmurs, rubs, clicks or gallops Abdomen - soft, nontender, nondistended, no masses or organomegaly Neurological - alert, oriented x 3, normal speech, cranial nerves 2-12 tested and intact, strength 5/5 in extremities x 3- excluding right lower extremity which patient states is his baseline, sensation intact Extremities - peripheral pulses normal, no pedal edema, no clubbing or cyanosis Skin - normal coloration and turgor, no rashes  ED Course  Procedures (including critical care time) DIAGNOSTIC STUDIES: Oxygen Saturation is 99% on RA, normal by my interpretation.    COORDINATION OF CARE: 4:43 PM-Discussed treatment plan with pt at bedside and pt agreed to plan.     Labs Review Labs Reviewed  URINALYSIS, ROUTINE W REFLEX MICROSCOPIC (NOT AT [facility]) - Abnormal; Notable for the following:    APPearance CLOUDY (*)    Hgb urine dipstick MODERATE (*)    Protein, ur 30 (*)    Leukocytes, UA MODERATE (*)    All other components within normal limits  URINE MICROSCOPIC-ADD ON - Abnormal; Notable for the following:    Squamous Epithelial  / LPF 0-5 (*)    Bacteria, UA FEW (*)    All other components within normal limits  URINE CULTURE  CBC  BASIC METABOLIC PANEL  LIPASE, BLOOD    Imaging Review Ct Head Wo Contrast  12/03/2015  CLINICAL DATA:  Dizziness. EXAM: CT HEAD WITHOUT CONTRAST TECHNIQUE: Contiguous axial images were obtained from the base of the skull through the vertex without intravenous contrast. COMPARISON:  05/18/2015 head CT. FINDINGS: No evidence of parenchymal hemorrhage or extra-axial fluid collection. No mass lesion, mass effect, or midline shift. No CT evidence of acute infarction. Cerebral volume is age appropriate. No ventriculomegaly. Minimal mucosal thickening in the visualized left maxillary sinus. Inspissated secretions in the left sphenoid sinus, similar to the prior head CT. Minimal opacification of mastoid air cells bilaterally, similar to the prior head CT. No evidence of calvarial fracture. IMPRESSION: 1.  No evidence of acute intracranial abnormality. 2. Minimal bilateral mastoid air cell opacification, similar to the prior head CT, suggesting chronic inflammatory etiology. 3. Mild left paranasal sinusitis, probably chronic. Electronically Signed   By: Ilona Sorrel M.D.   On: 12/03/2015 17:32      EKG Interpretation   Date/Time:  Saturday December 03 2015 16:52:25 EST Ventricular  Rate:  65 PR Interval:  176 QRS Duration: 108 QT Interval:  403 QTC Calculation: 419 R Axis:   76 Text Interpretation:  Sinus rhythm RSR' in V1 or V2, right VCD or RVH No  significant change since last tracing Confirmed by [facility]  MD, MARTHA  [phone]) on 12/03/2015 5:11:02 PM      MDM   Final diagnoses:  Benign positional vertigo, unspecified laterality   Pt presenting with c/o onset of dizziness when bending forward yesterday.  He has a nonfocal neuro exam (except chronic deficits that he states are from prior polio infection.  He did not have sudden onset headache and normal neuro exam so doubt SAH, doubt CVA.   No fever to suggest meningitis.  Head CT reassuring as well as other lab evaluation.  Pt treated with meclizine.  Discharged with strict return precautions.  Pt agreeable with plan.   I personally performed the services described in this documentation, which was scribed in my presence. The recorded information has been reviewed and is accurate.       Alfonzo Beers, MD 12/03/15 [id number]

## 2015-12-03 NOTE — Discharge Instructions (Signed)
Return to the ED with any concerns including fainting, seizure activity, vomiting and not able to keep down liquids, weakness of arms or legs, changes in vision or speech, decreased level of alertness/lethargy, or any other alarming symptoms

## 2015-12-05 LAB — URINE CULTURE: Culture: 2000

## 2015-12-05 NOTE — Progress Notes (Signed)
   Subjective:    Patient ID: [name], male    DOB: 11-30-1950, 65 y.o.   MRN: [id number]  HPI 65 yo former smoker with COPD , former smoker   TEST :  [name] 07/08/14: FeV1 51% FeV1/FVC 66% FVC 59% 10/5/2015ONO RA was normal  10/13/2014  ONO on RA NORMAL  11/25/15 Follow up COPD /OSA  Pt returns for 1 month follow up .  Was seen last month .  Set up for home sleep study on 11/09/15  that showed AHI 8.5/hr , low sat 79%.  We discussed treatment options including wt loss, oral appliance and CPAP . He would like to proceed with CPAP .   Recent admission for COPD flare , Depression with excessive etoh use. He was treated with prednisone and abx. He is feeling better with less cough and dyspnea.  Still has dry cough.  Flu shot , PVX and Prevnar utd   Says he taking BREO and Spiriva on reg basis .  Has been having urinary urgency . Had recent prostate bx .  We discussed holding spiriva for while to see if this improves.  Denies chest pain,orthopnea, edema or fever.      Review of Systems  Constitutional:   No  weight loss, night sweats,  Fevers, chills, fatigue, or  lassitude.  HEENT:   No headaches,  Difficulty swallowing,  Tooth/dental problems, or  Sore throat,                No sneezing, itching, ear ache, +nasal congestion, post nasal drip,   CV:  No chest pain,  Orthopnea, PND, swelling in lower extremities, anasarca, dizziness, palpitations, syncope.   GI  No heartburn, indigestion, abdominal pain, nausea, vomiting, diarrhea, change in bowel habits, loss of appetite, bloody stools.   Resp: .  No chest wall deformity  Skin: no rash or lesions.  GU: no dysuria, change in color of urine, no urgency or frequency.  No flank pain, no hematuria   MS:  No joint pain or swelling.  No decreased range of motion.  No back pain.  Psych:  No change in mood or affect. No depression or anxiety.  No memory loss.          Objective:   Physical Exam Filed Vitals:   11/25/15 1213  BP: 126/80  Pulse: 56  Temp: 98 F (36.7 C)  TempSrc: Oral  Height: 6' (1.829 m)  Weight: 215 lb (97.523 kg)  SpO2: 98%   Body mass index is 29.15 kg/(m^2).    GEN: A/Ox3; pleasant , NAD ,   HEENT:  [place]/AT,  EACs-clear, TMs-wnl, NOSE-clear, THROAT-clear, no lesions, no postnasal drip or exudate noted.   NECK:  Supple w/ fair ROM; no JVD; normal carotid impulses w/o bruits; no thyromegaly or nodules palpated; no lymphadenopathy.  RESP  Decreased BS , no wheezing .no accessory muscle use, no dullness to percussion  CARD:  RRR, no m/r/g  , no peripheral edema, pulses intact, no cyanosis or clubbing.  GI:   Soft & nt; nml bowel sounds; no organomegaly or masses detected.  Musco: Warm bil, no deformities or joint swelling noted.   Neuro: alert, no focal deficits noted.    Skin: Warm, no lesions or rashes         Assessment & Plan:

## 2015-12-05 NOTE — Assessment & Plan Note (Signed)
Moderate COPD with recent flare now resolving  ? spiriva side effects with urinary issues Hold for short time , if no change , restart or consider Tudorza   Plan  Stop Spiriva for now .  Continue on BREO daily  Follow up Dr. Elsworth Soho  In 2 months and As needed   Please contact office for sooner follow up if symptoms do not improve or worsen or seek emergency care

## 2015-12-05 NOTE — Assessment & Plan Note (Signed)
Mild OSA on sleep study  Discussed results and treatment option  He would like to proceed with CPAP   Plan  Begin CPAP At bedtime . CPAP download in 1 month .  Goal is to wear for 4-6 hr each night .  Do not drive if sleepy.  Work on weight loss .   \Follow up Dr. Elsworth Soho  In 2 months and As needed   Please contact office for sooner follow up if symptoms do not improve or worsen or seek emergency care

## 2015-12-08 ENCOUNTER — Encounter: Payer: Self-pay | Admitting: Family Medicine

## 2015-12-08 ENCOUNTER — Ambulatory Visit (INDEPENDENT_AMBULATORY_CARE_PROVIDER_SITE_OTHER): Payer: Medicare Other | Admitting: Family Medicine

## 2015-12-08 VITALS — BP 131/79 | HR 69 | Ht 72.0 in | Wt 215.0 lb

## 2015-12-08 DIAGNOSIS — M25522 Pain in left elbow: Secondary | ICD-10-CM | POA: Diagnosis not present

## 2015-12-08 DIAGNOSIS — M25511 Pain in right shoulder: Secondary | ICD-10-CM | POA: Diagnosis not present

## 2015-12-08 MED ORDER — METHYLPREDNISOLONE ACETATE 40 MG/ML IJ SUSP
40.0000 mg | Freq: Once | INTRAMUSCULAR | Status: AC
  Administered 2015-12-08: 40 mg via INTRA_ARTICULAR

## 2015-12-08 NOTE — Patient Instructions (Signed)
You have rotator cuff impingement of your right shoulder Try to avoid painful activities (overhead activities, lifting with extended arm) as much as possible. Continue with the topical medications. Can take tylenol in addition to this. Subacromial injection may be beneficial to help with pain and to decrease inflammation - you were given this today. Consider physical therapy with transition to home exercise program. Do home exercise program with theraband and scapular stabilization exercises daily - these are very important for long term relief even if an injection was given.  3 sets of 10 once a day. If not improving at follow-up we will consider further imaging, physical therapy, and/or nitro patches.  Your left elbow pain is due to distal biceps tendinitis. Compression sleeve to help with this. Topical medications for this also. Start physical therapy and do home exercises on days you don't go to therapy. Follow up with me in 6 weeks.

## 2015-12-09 ENCOUNTER — Ambulatory Visit: Payer: Self-pay | Admitting: Physician Assistant

## 2015-12-12 ENCOUNTER — Telehealth: Payer: Self-pay | Admitting: Family Medicine

## 2015-12-12 ENCOUNTER — Encounter: Payer: Self-pay | Admitting: Physician Assistant

## 2015-12-12 ENCOUNTER — Ambulatory Visit (INDEPENDENT_AMBULATORY_CARE_PROVIDER_SITE_OTHER): Payer: Medicare Other | Admitting: Physician Assistant

## 2015-12-12 VITALS — BP 123/63 | HR 56 | Temp 97.8°F | Ht 72.0 in | Wt 212.4 lb

## 2015-12-12 DIAGNOSIS — F10230 Alcohol dependence with withdrawal, uncomplicated: Secondary | ICD-10-CM | POA: Diagnosis not present

## 2015-12-12 DIAGNOSIS — J441 Chronic obstructive pulmonary disease with (acute) exacerbation: Secondary | ICD-10-CM | POA: Diagnosis not present

## 2015-12-12 DIAGNOSIS — F10930 Alcohol use, unspecified with withdrawal, uncomplicated: Secondary | ICD-10-CM

## 2015-12-12 DIAGNOSIS — R790 Abnormal level of blood mineral: Secondary | ICD-10-CM

## 2015-12-12 DIAGNOSIS — M25522 Pain in left elbow: Secondary | ICD-10-CM | POA: Insufficient documentation

## 2015-12-12 DIAGNOSIS — F3131 Bipolar disorder, current episode depressed, mild: Secondary | ICD-10-CM | POA: Diagnosis not present

## 2015-12-12 LAB — BASIC METABOLIC PANEL
BUN: 17 mg/dL (ref 6–23)
CO2: 26 mEq/L (ref 19–32)
CREATININE: 1.18 mg/dL (ref 0.40–1.50)
Calcium: 9.3 mg/dL (ref 8.4–10.5)
Chloride: 104 mEq/L (ref 96–112)
GFR: 65.63 mL/min (ref >60.00)
Glucose, Bld: 101 mg/dL — ABNORMAL HIGH (ref 70–99)
Potassium: 4.6 mEq/L (ref 3.5–5.1)
Sodium: 136 mEq/L (ref 135–145)

## 2015-12-12 LAB — VITAMIN D 25 HYDROXY (VIT D DEFICIENCY, FRACTURES): VITD: 15.85 ng/mL — AB (ref 30.00–100.00)

## 2015-12-12 NOTE — Telephone Encounter (Signed)
It probably hasn't kicked in yet - I wouldn't expect him to overdo it unless there was a lot of overhead and reaching activities.  We could do a short course of norco (20 tabs) if he needed something for pain.  I think it's too early to say if the shot didn't work.

## 2015-12-12 NOTE — Assessment & Plan Note (Signed)
consistent with rotator cuff impingement.  Shown home exercises to do daily.  Subacromial injection given today.  Consider physical therapy, imaging, nitro patches if not improving.  After informed written consent, patient was seated on exam table. Right shoulder was prepped with alcohol swab and utilizing posterior approach, patient's right subacromial space was injected with 3:1 marcaine: depomedrol. Patient tolerated the procedure well without immediate complications.

## 2015-12-12 NOTE — Patient Instructions (Signed)
Please continue medications as directed.  I am working on the patient assistance for Dexilant and with your oxygen certification. Please follow-up with Pulmonology as scheduled. It will be up to them how they want to titrate or change oxygen.  Follow the maneuvers below at home to help with Vertigo. Follow-up with Neurology and psychiatry as directed.  Epley Maneuver Self-Care WHAT IS THE EPLEY MANEUVER? The Epley maneuver is an exercise you can do to relieve symptoms of benign paroxysmal positional vertigo (BPPV). This condition is often just referred to as vertigo. BPPV is caused by the movement of tiny crystals (canaliths) inside your inner ear. The accumulation and movement of canaliths in your inner ear causes a sudden spinning sensation (vertigo) when you move your head to certain positions. Vertigo usually lasts about 30 seconds. BPPV usually occurs in just one ear. If you get vertigo when you lie on your left side, you probably have BPPV in your left ear. Your health care provider can tell you which ear is involved.  BPPV may be caused by a head injury. Many people older than 50 get BPPV for unknown reasons. If you have been diagnosed with BPPV, your health care provider may teach you how to do this maneuver. BPPV is not life threatening (benign) and usually goes away in time.  WHEN SHOULD I PERFORM THE EPLEY MANEUVER? You can do this maneuver at home whenever you have symptoms of vertigo. You may do the Epley maneuver up to 3 times a day until your symptoms of vertigo go away. HOW SHOULD I DO THE EPLEY MANEUVER? 1. Sit on the edge of a bed or table with your back straight. Your legs should be extended or hanging over the edge of the bed or table.  2. Turn your head halfway toward the affected ear.  3. Lie backward quickly with your head turned until you are lying flat on your back. You may want to position a pillow under your shoulders.  4. Hold this position for 30 seconds. You may  experience an attack of vertigo. This is normal. Hold this position until the vertigo stops. 5. Then turn your head to the opposite direction until your unaffected ear is facing the floor.  6. Hold this position for 30 seconds. You may experience an attack of vertigo. This is normal. Hold this position until the vertigo stops. 7. Now turn your whole body to the same side as your head. Hold for another 30 seconds.  8. You can then sit back up. ARE THERE RISKS TO THIS MANEUVER? In some cases, you may have other symptoms (such as changes in your vision, weakness, or numbness). If you have these symptoms, stop doing the maneuver and call your health care provider. Even if doing these maneuvers relieves your vertigo, you may still have dizziness. Dizziness is the sensation of light-headedness but without the sensation of movement. Even though the Epley maneuver may relieve your vertigo, it is possible that your symptoms will return within 5 years. WHAT SHOULD I DO AFTER THIS MANEUVER? After doing the Epley maneuver, you can return to your normal activities. Ask your doctor if there is anything you should do at home to prevent vertigo. This may include:  Sleeping with two or more pillows to keep your head elevated.  Not sleeping on the side of your affected ear.  Getting up slowly from bed.  Avoiding sudden movements during the day.  Avoiding extreme head movement, like looking up or bending over.  Wearing  a cervical collar to prevent sudden head movements. WHAT SHOULD I DO IF MY SYMPTOMS GET WORSE? Call your health care provider if your vertigo gets worse. Call your provider right way if you have other symptoms, including:   Nausea.  Vomiting.  Headache.  Weakness.  Numbness.  Vision changes.   This information is not intended to replace advice given to you by your health care provider. Make sure you discuss any questions you have with your health care provider.   Document  Released: 10/06/2013 Document Reviewed: 10/06/2013 Elsevier Interactive Patient Education [employer].

## 2015-12-12 NOTE — Assessment & Plan Note (Signed)
2/2 distal biceps tendinitis.  Compression sleeve, start physical therapy and home exercises.  F/u in 6 weeks.

## 2015-12-12 NOTE — Progress Notes (Signed)
PCP: Leeanne Rio, PA-C  Subjective:   HPI: Patient is a 65 y.o. male here for right shoulder, left elbow pain.  Patient reports over past 3-4 weeks he's had worsening right shoulder pain. No known injury or trauma. Pain level up to 10/10, worse with changes in weather and lifting overhead. Radiates into neck, sharp. Tried biofreeze. Also with anterior left elbow pain. No swelling, skin changes. Pain 0/10 at rest, up to 4/10 at worst. No fever, other complaints.  Past Medical History  Diagnosis Date  . Hypertension   . GERD (gastroesophageal reflux disease)   . History of TB (tuberculosis)     1984--  hospitalized for 4 month treatment  . Post-polio syndrome     polio at age 52--PT WAS IN IRON LUNG; PT WAS IN W/C UNTIL AGE 56; STILL HAS WEAKNESS RIGHT SIDE  . History of chronic bronchitis   . History of rheumatic fever   . Bladder outlet obstruction   . Anxiety disorder   . History of urinary retention   . Nocturnal oxygen desaturation     USES O2 NIGHTLY  . Complication of anesthesia     DIFFICULT WAKING   . Diabetes mellitus without complication ([place])     BODERLINE - DIET CONTROL  . Anxiety   . Dysrhythmia     PVC'S  . Arthritis     BILATERAL SHOULDERS, ELBOWS AND HANDS AND LEFT HIP AND KNEES--HX CORTISONE SHOTS IN SHOULDERS, ELBOWS, HIP AND KNEES  . History of oxygen administration     oxygen use 2 l/m nasally at bedtime and exertional occasions  . COPD, frequent exacerbations ([place])     pulmologist-  dr Joya Gaskins--  [place] Rm Stage C.04-25-15 recent COPD exacerbation-much improved now, after tx. in ER [facility].  Marland Kitchen PONV (postoperative nausea and vomiting)   . Family history of adverse reaction to anesthesia     father would wake up with agitation   . Shortness of breath dyspnea     RIGHT HEMIDIAPHRAGM ELEVATION - POST POLIO SYNDROME  . Pneumonia     hx of   . Hx of multiple concussions     x 2 per patient   . Depression   . Schizophrenia ([place])   . Prostate  cancer ([place])   . Prostate cancer ([place])   . Melanoma [facility])     Current Outpatient Prescriptions on File Prior to Visit  Medication Sig Dispense Refill  . albuterol (PROVENTIL HFA;VENTOLIN HFA) 108 (90 Base) MCG/ACT inhaler Inhale 2 puffs into the lungs every 6 (six) hours as needed for wheezing or shortness of breath. 3 Inhaler 3  . ALPRAZolam (XANAX) 1 MG tablet Take 0.5 tablets (0.5 mg total) by mouth 3 (three) times daily as needed for anxiety. (Patient taking differently: Take 1 mg by mouth daily as needed for anxiety. ) 90 tablet 0  . amLODipine (NORVASC) 10 MG tablet Take 1 tablet (10 mg total) by mouth daily. 90 tablet 1  . dexlansoprazole (DEXILANT) 60 MG capsule Take 1 capsule (60 mg total) by mouth daily. 90 capsule 1  . diphenhydrAMINE (SOMINEX) 25 MG tablet Take 25 mg by mouth daily as needed for itching or sleep.    Marland Kitchen doxycycline (VIBRA-TABS) 100 MG tablet Take 1 tablet (100 mg total) by mouth every 12 (twelve) hours. 6 tablet 0  . fluticasone (FLONASE) 50 MCG/ACT nasal spray Place 2 sprays into both nostrils daily. (Patient taking differently: Place 2 sprays into both nostrils daily as needed for allergies. ) 16  g 6  . Fluticasone Furoate-Vilanterol (BREO ELLIPTA) 100-25 MCG/INH AEPB INHALE 1 PUFF INTO THE LUNGS EVERY MORNING. DAILY 180 each 0  . guaiFENesin (MUCINEX) 600 MG 12 hr tablet Take 1 tablet (600 mg total) by mouth 2 (two) times daily. 15 tablet 0  . ipratropium-albuterol (DUONEB) 0.5-2.5 (3) MG/3ML SOLN Take 3 mLs by nebulization every 4 (four) hours as needed (shortness of breath and wheezing).    Marland Kitchen loratadine (CLARITIN) 10 MG tablet Take 10 mg by mouth daily as needed for allergies.   5  . meclizine (ANTIVERT) 25 MG tablet Take 1 tablet (25 mg total) by mouth 3 (three) times daily as needed. 300 tablet 0  . metoprolol succinate (TOPROL-XL) 25 MG 24 hr tablet Take 1 tablet (25 mg total) by mouth daily. 90 tablet 1  . montelukast (SINGULAIR) 10 MG tablet TAKE 1 TABLET (10  MG TOTAL) BY MOUTH AT BEDTIME. 90 tablet 1  . nitroGLYCERIN (NITROSTAT) 0.4 MG SL tablet Place 1 tablet (0.4 mg total) under the tongue every 5 (five) minutes as needed for chest pain. 30 tablet 3  . ondansetron (ZOFRAN ODT) 4 MG disintegrating tablet Take 1 tablet (4 mg total) by mouth every 8 (eight) hours as needed for nausea. 20 tablet 0  . oxyCODONE (ROXICODONE) 15 MG immediate release tablet Take 15 mg by mouth every 8 (eight) hours as needed. for pain  0  . OXYGEN Inhale 2 L into the lungs at bedtime. Reported on 11/25/2015    . phenazopyridine (PYRIDIUM) 200 MG tablet Take 200 mg by mouth 3 (three) times daily as needed for pain (pt states it bothers his stomach but he still takes it when needed). Reported on 11/25/2015    . polyethylene glycol (MIRALAX / GLYCOLAX) packet Take 17 g by mouth 2 (two) times daily. 20 each 0  . predniSONE (DELTASONE) 10 MG tablet 40 mg x 3 days, 30 mg x 3 days, 20 mg x 3 days, 10 mg x 3 days and then go back to what Dr. Elsworth Soho had you on 30 tablet 0  . promethazine (PHENERGAN) 25 MG tablet Take 25 mg by mouth every 6 (six) hours as needed for nausea or vomiting.    . tiotropium (SPIRIVA) 18 MCG inhalation capsule Place 1 capsule (18 mcg total) into inhaler and inhale daily. IN AM (Patient taking differently: Place 18 mcg into inhaler and inhale at bedtime. ) 90 capsule 1  . traMADol (ULTRAM-ER) 300 MG 24 hr tablet Take 1 tablet (300 mg total) by mouth daily. (Patient not taking: Reported on 11/25/2015) 30 tablet 0   No current facility-administered medications on file prior to visit.    Past Surgical History  Procedure Laterality Date  . Other surgical history       Muscle & bone Graft/Polio  . Cystoscopy w/ cystogram/  transrectal ultrasound prostate bx  03-22-2009  . Cardiovascular stress test  06-08-2014  dr Mare Ferrari    normal lexiscan study/  no ischemia/  not gated due to PAC's  . Ureterosopy stone extraction  2000  . Nasal septum surgery  2000  .  Shoulder arthroscopy with open rotator cuff repair Bilateral 2013  &  1999    removal spurs and bursectomy  . Laparoscopic cholecystectomy  2013  . Prostate biopsy N/A 09/28/2014    Procedure: PROSTATE ULTRASOUND/BIOPSY;  Surgeon: [name], MD;  Location: [facility];  Service: Urology;  Laterality: N/A;  . Transurethral resection of prostate N/A 09/28/2014    Procedure:  TRANSURETHRAL RESECTION OF THE PROSTATE (TURP);  Surgeon: [name], MD;  Location: [facility];  Service: Urology;  Laterality: N/A;  . Esophagogastroduodenoscopy N/A 03/22/2015    Procedure: ESOPHAGOGASTRODUODENOSCOPY (EGD) with dilation;  Surgeon: [name], MD;  Location: [facility];  Service: Endoscopy;  Laterality: N/A;  . Savory dilation N/A 03/22/2015    Procedure: SAVORY DILATION;  Surgeon: [name], MD;  Location: [facility];  Service: Endoscopy;  Laterality: N/A;  . Colonoscopy N/A 05/03/2015    Procedure: COLONOSCOPY;  Surgeon: [name], MD;  Location: [facility];  Service: Endoscopy;  Laterality: N/A;  . Polio surgeries       14 polio surgeries   . Left elbow surgery       due to fracture   . Prostate biopsy N/A 10/25/2015    Procedure: PROSTATE BIOPSY AND ULTRASOUND;  Surgeon: [name], MD;  Location: [facility];  Service: Urology;  Laterality: N/A;  . Transurethral incision of bladder neck N/A 10/25/2015    Procedure:  TRANSURETHRAL INCISION OF BLADDER NECK;  Surgeon: [name], MD;  Location: [facility];  Service: Urology;  Laterality: N/A;  . Cystoscopy N/A 10/25/2015    Procedure: CYSTOSCOPY;  Surgeon: [name], MD;  Location: [facility];  Service: Urology;  Laterality: N/A;    Allergies  Allergen Reactions  . Ivp Dye [Iodinated Diagnostic Agents] Anaphylaxis  . Levaquin [Levofloxacin In D5w] Shortness Of Breath and Swelling    In addition: sweating, chest pain, and diarrhea.   . Penicillins Anaphylaxis    Heart stops Has patient had a PCN reaction causing immediate rash, facial/tongue/throat  swelling, SOB or lightheadedness with hypotension: yes Has patient had a PCN reaction causing severe rash involving mucus membranes or skin necrosis: yes Has patient had a PCN reaction that required hospitalization yes Has patient had a PCN reaction occurring within the last 10 years: No If all of the above answers are "NO", then may proceed with Cephalosporin use.   . Aspirin Other (See Comments)    Reaction unknown  . Nsaids Other (See Comments)    Difficulty breathing  . Tolmetin Other (See Comments)    Difficulty breathing  . Buprenorphine Hcl Nausea And Vomiting    Can take with zofran   . Morphine And Related Nausea And Vomiting    Can take with zofran     Social History   Social History  . Marital Status: Married    Spouse Name: N/A  . Number of Children: N/A  . Years of Education: N/A   Occupational History  . Retired    Social History Main Topics  . Smoking status: Former Smoker -- 23 years    Quit date: 08/25/2013  . Smokeless tobacco: Former Systems developer  . Alcohol Use: 0.0 oz/week    0 Standard drinks or equivalent per week     Comment: daily  . Drug Use: No     Comment: hx of marijuana use   . Sexual Activity: Not on file   Other Topics Concern  . Not on file   Social History Narrative    Family History  Problem Relation Age of Onset  . Alzheimer's disease Father 4    Deceased  . Stomach cancer Father   . Heart attack Father   . Heart disease Father   . Skin cancer Mother     Facial-Living  . Alcohol abuse Sister     x2  . Mental illness Sister     x2  .  Diabetes Maternal Aunt     x2  . Thyroid disease Maternal Aunt     x4  . Diabetes Maternal Uncle   . Tuberculosis Paternal Grandfather   . Tuberculosis Paternal Grandmother   . Alzheimer's disease Paternal Aunt   . Alzheimer's disease Paternal Uncle   . Colon cancer Neg Hx   . Colon polyps Neg Hx   . Crohn's disease Neg Hx   . Ulcerative colitis Neg Hx     BP 131/79 mmHg  Pulse 69  Ht  6' (1.829 m)  Wt 215 lb (97.523 kg)  BMI 29.15 kg/m2  Review of Systems: See HPI above.    Objective:  Physical Exam:  Gen: NAD, comfortable in exam room  Right shoulder: No swelling, ecchymoses.  No gross deformity. No TTP. FROM with painful arc. Positive Hawkins, Neers. Negative Speeds, Yergasons. Strength 5/5 with empty can and resisted internal/external rotation. Negative apprehension. NV intact distally.  Left shoulder: FROM without pain.  Left elbow: No gross deformity, swelling, bruising. TTP distal biceps tendon.  No other tenderness. FROM with 5/5 strength without pain. Collateral ligaments intact.    Assessment & Plan:  1. Right shoulder pain - consistent with rotator cuff impingement.  Shown home exercises to do daily.  Subacromial injection given today.  Consider physical therapy, imaging, nitro patches if not improving.  After informed written consent, patient was seated on exam table. Right shoulder was prepped with alcohol swab and utilizing posterior approach, patient's right subacromial space was injected with 3:1 marcaine: depomedrol. Patient tolerated the procedure well without immediate complications.  2. Left elbow pain - 2/2 distal biceps tendinitis.  Compression sleeve, start physical therapy and home exercises.  F/u in 6 weeks.

## 2015-12-12 NOTE — Progress Notes (Signed)
Pre visit review using our clinic review tool, if applicable. No additional management support is needed unless otherwise documented below in the visit note. 

## 2015-12-13 ENCOUNTER — Other Ambulatory Visit: Payer: Self-pay | Admitting: Physician Assistant

## 2015-12-13 MED ORDER — VITAMIN D (ERGOCALCIFEROL) 1.25 MG (50000 UNIT) PO CAPS: 50000.0000 [IU] | ORAL_CAPSULE | ORAL | Status: DC

## 2015-12-14 ENCOUNTER — Telehealth: Payer: Self-pay | Admitting: *Deleted

## 2015-12-14 ENCOUNTER — Ambulatory Visit: Payer: Medicare Other | Admitting: Physical Therapy

## 2015-12-14 NOTE — Telephone Encounter (Signed)
Patient completed new patient assistance forms for Dexilant via [employer] while in office for appointment with provider; completed forms faxed to [employer] at [phone] with Urgent request, as original mailed app was returned to sender [incomplete] and is in need of medication/SLS 03/01

## 2015-12-16 ENCOUNTER — Other Ambulatory Visit: Payer: Self-pay | Admitting: *Deleted

## 2015-12-16 MED ORDER — DEXLANSOPRAZOLE 60 MG PO CPDR: 60.0000 mg | DELAYED_RELEASE_CAPSULE | Freq: Every day | ORAL | Status: DC

## 2015-12-20 ENCOUNTER — Ambulatory Visit: Payer: Medicare Other | Attending: Family Medicine | Admitting: Physical Therapy

## 2015-12-21 ENCOUNTER — Telehealth: Payer: Self-pay | Admitting: Physician Assistant

## 2015-12-21 ENCOUNTER — Other Ambulatory Visit: Payer: Self-pay | Admitting: *Deleted

## 2015-12-21 DIAGNOSIS — M170 Bilateral primary osteoarthritis of knee: Secondary | ICD-10-CM

## 2015-12-21 MED ORDER — DEXLANSOPRAZOLE 60 MG PO CPDR: 60.0000 mg | DELAYED_RELEASE_CAPSULE | Freq: Every day | ORAL | Status: DC

## 2015-12-21 NOTE — Telephone Encounter (Signed)
Caller name:Self  Can be reached: [phone]   Reason for call: Request referral to see orthopedic specialist. States that he had a cortisone injection but it is not working this time. Request not to see [facility]. States he has spoken to [place] about this and just needs the referral.

## 2015-12-21 NOTE — Telephone Encounter (Signed)
Rx printed and faxed to [employer] patient assistance program-[employer].  Confirmation received.//AB/CMA

## 2015-12-22 NOTE — Telephone Encounter (Signed)
Referral placed.

## 2015-12-23 DIAGNOSIS — M75111 Incomplete rotator cuff tear or rupture of right shoulder, not specified as traumatic: Secondary | ICD-10-CM | POA: Diagnosis not present

## 2015-12-23 DIAGNOSIS — M5412 Radiculopathy, cervical region: Secondary | ICD-10-CM | POA: Diagnosis not present

## 2015-12-25 DIAGNOSIS — R790 Abnormal level of blood mineral: Secondary | ICD-10-CM | POA: Insufficient documentation

## 2015-12-25 NOTE — Assessment & Plan Note (Signed)
Will repeat calcium level today.

## 2015-12-25 NOTE — Assessment & Plan Note (Signed)
Resolved. Continue chronic medications regimen. Follow-up with Pulmonology as scheduled.

## 2015-12-25 NOTE — Assessment & Plan Note (Signed)
Will defer further medication management to Psychiatry. Patient is stable at present. Continue current medications.

## 2015-12-25 NOTE — Progress Notes (Signed)
Patient presents to clinic today for hospital follow-up of alcohol withdrawal and COPD exacerbation. Patient admitted to hospital on 11/21/15 with complaints of SOB, dry mouth, anxiety, etc. Had binge drank the morning before. Patient suspected to have alcohol withdrawals. Patient was agitated and had suicidal thoughts. Patient stabilized with Ativan and Haldol. Psychiatry consulted who recommended outpatient management.  Significant wheezing noted so patient also diagnosed with COPD exacerbation. Patient started on oral steroid course during stay with resolution of exacerbation.   Since discharge, patient endorses doing well. Is refraining from drinking. Has his first AA meeting tonight in [place]. Is taking medications as directed. Has been set up to see Psychiatrist and counseling for anxiety/depression and alcohol abuse to help him find healthier ways to deal with stress. Denies depressed mood, suicidal ideations. Denies wheezing or SOB.  Of note calcium noted to be low on labs at discharge. Needs reassessment today.  Past Medical History  Diagnosis Date  . Hypertension   . GERD (gastroesophageal reflux disease)   . History of TB (tuberculosis)     1984--  hospitalized for 4 month treatment  . Post-polio syndrome     polio at age 47--PT WAS IN IRON LUNG; PT WAS IN W/C UNTIL AGE 30; STILL HAS WEAKNESS RIGHT SIDE  . History of chronic bronchitis   . History of rheumatic fever   . Bladder outlet obstruction   . Anxiety disorder   . History of urinary retention   . Nocturnal oxygen desaturation     USES O2 NIGHTLY  . Complication of anesthesia     DIFFICULT WAKING   . Diabetes mellitus without complication ([place])     BODERLINE - DIET CONTROL  . Anxiety   . Dysrhythmia     PVC'S  . Arthritis     BILATERAL SHOULDERS, ELBOWS AND HANDS AND LEFT HIP AND KNEES--HX CORTISONE SHOTS IN SHOULDERS, ELBOWS, HIP AND KNEES  . History of oxygen administration     oxygen use 2 l/m nasally at  bedtime and exertional occasions  . COPD, frequent exacerbations ([place])     pulmologist-  dr Joya Gaskins--  [place] Rm Stage C.04-25-15 recent COPD exacerbation-much improved now, after tx. in ER [facility].  Marland Kitchen PONV (postoperative nausea and vomiting)   . Family history of adverse reaction to anesthesia     father would wake up with agitation   . Shortness of breath dyspnea     RIGHT HEMIDIAPHRAGM ELEVATION - POST POLIO SYNDROME  . Pneumonia     hx of   . Hx of multiple concussions     x 2 per patient   . Depression   . Schizophrenia ([place])   . Prostate cancer ([place])   . Prostate cancer ([place])   . Melanoma [facility])     Current Outpatient Prescriptions on File Prior to Visit  Medication Sig Dispense Refill  . albuterol (PROVENTIL HFA;VENTOLIN HFA) 108 (90 Base) MCG/ACT inhaler Inhale 2 puffs into the lungs every 6 (six) hours as needed for wheezing or shortness of breath. 3 Inhaler 3  . ALPRAZolam (XANAX) 1 MG tablet Take 0.5 tablets (0.5 mg total) by mouth 3 (three) times daily as needed for anxiety. (Patient taking differently: Take 1 mg by mouth daily as needed for anxiety. ) 90 tablet 0  . amLODipine (NORVASC) 10 MG tablet Take 1 tablet (10 mg total) by mouth daily. 90 tablet 1  . diphenhydrAMINE (SOMINEX) 25 MG tablet Take 25 mg by mouth daily as needed for itching or sleep.    Marland Kitchen  doxycycline (VIBRA-TABS) 100 MG tablet Take 1 tablet (100 mg total) by mouth every 12 (twelve) hours. 6 tablet 0  . fluticasone (FLONASE) 50 MCG/ACT nasal spray Place 2 sprays into both nostrils daily. (Patient taking differently: Place 2 sprays into both nostrils daily as needed for allergies. ) 16 g 6  . Fluticasone Furoate-Vilanterol (BREO ELLIPTA) 100-25 MCG/INH AEPB INHALE 1 PUFF INTO THE LUNGS EVERY MORNING. DAILY 180 each 0  . guaiFENesin (MUCINEX) 600 MG 12 hr tablet Take 1 tablet (600 mg total) by mouth 2 (two) times daily. 15 tablet 0  . ipratropium-albuterol (DUONEB) 0.5-2.5 (3) MG/3ML SOLN Take 3 mLs by  nebulization every 4 (four) hours as needed (shortness of breath and wheezing).    Marland Kitchen loratadine (CLARITIN) 10 MG tablet Take 10 mg by mouth daily as needed for allergies.   5  . metoprolol succinate (TOPROL-XL) 25 MG 24 hr tablet Take 1 tablet (25 mg total) by mouth daily. 90 tablet 1  . montelukast (SINGULAIR) 10 MG tablet TAKE 1 TABLET (10 MG TOTAL) BY MOUTH AT BEDTIME. 90 tablet 1  . nitroGLYCERIN (NITROSTAT) 0.4 MG SL tablet Place 1 tablet (0.4 mg total) under the tongue every 5 (five) minutes as needed for chest pain. 30 tablet 3  . oxyCODONE (ROXICODONE) 15 MG immediate release tablet Take 15 mg by mouth every 8 (eight) hours as needed. for pain  0  . OXYGEN Inhale 2 L into the lungs at bedtime. Reported on 11/25/2015    . polyethylene glycol (MIRALAX / GLYCOLAX) packet Take 17 g by mouth 2 (two) times daily. 20 each 0  . predniSONE (DELTASONE) 10 MG tablet 40 mg x 3 days, 30 mg x 3 days, 20 mg x 3 days, 10 mg x 3 days and then go back to what Dr. Elsworth Soho had you on 30 tablet 0  . promethazine (PHENERGAN) 25 MG tablet Take 25 mg by mouth every 6 (six) hours as needed for nausea or vomiting.    . tiotropium (SPIRIVA) 18 MCG inhalation capsule Place 1 capsule (18 mcg total) into inhaler and inhale daily. IN AM (Patient taking differently: Place 18 mcg into inhaler and inhale at bedtime. ) 90 capsule 1  . traMADol (ULTRAM-ER) 300 MG 24 hr tablet Take 1 tablet (300 mg total) by mouth daily. 30 tablet 0   No current facility-administered medications on file prior to visit.    Allergies  Allergen Reactions  . Ivp Dye [Iodinated Diagnostic Agents] Anaphylaxis  . Levaquin [Levofloxacin In D5w] Shortness Of Breath and Swelling    In addition: sweating, chest pain, and diarrhea.   . Penicillins Anaphylaxis    Heart stops Has patient had a PCN reaction causing immediate rash, facial/tongue/throat swelling, SOB or lightheadedness with hypotension: yes Has patient had a PCN reaction causing severe rash  involving mucus membranes or skin necrosis: yes Has patient had a PCN reaction that required hospitalization yes Has patient had a PCN reaction occurring within the last 10 years: No If all of the above answers are "NO", then may proceed with Cephalosporin use.   . Aspirin Other (See Comments)    Reaction unknown  . Nsaids Other (See Comments)    Difficulty breathing  . Tolmetin Other (See Comments)    Difficulty breathing  . Buprenorphine Hcl Nausea And Vomiting    Can take with zofran   . Morphine And Related Nausea And Vomiting    Can take with zofran     Family History  Problem Relation  Age of Onset  . Alzheimer's disease Father 4    Deceased  . Stomach cancer Father   . Heart attack Father   . Heart disease Father   . Skin cancer Mother     Facial-Living  . Alcohol abuse Sister     x2  . Mental illness Sister     x2  . Diabetes Maternal Aunt     x2  . Thyroid disease Maternal Aunt     x4  . Diabetes Maternal Uncle   . Tuberculosis Paternal Grandfather   . Tuberculosis Paternal Grandmother   . Alzheimer's disease Paternal Aunt   . Alzheimer's disease Paternal Uncle   . Colon cancer Neg Hx   . Colon polyps Neg Hx   . Crohn's disease Neg Hx   . Ulcerative colitis Neg Hx     Social History   Social History  . Marital Status: Married    Spouse Name: N/A  . Number of Children: N/A  . Years of Education: N/A   Occupational History  . Retired    Social History Main Topics  . Smoking status: Former Smoker -- 66 years    Quit date: 08/25/2013  . Smokeless tobacco: Former Systems developer  . Alcohol Use: 0.0 oz/week    0 Standard drinks or equivalent per week     Comment: daily  . Drug Use: No     Comment: hx of marijuana use   . Sexual Activity: Not Asked   Other Topics Concern  . None   Social History Narrative    Review of Systems - See HPI.  All other ROS are negative.  BP 123/63 mmHg  Pulse 56  Temp(Src) 97.8 F (36.6 C) (Oral)  Ht 6' (1.829 m)  Wt  212 lb 6.4 oz (96.344 kg)  BMI 28.80 kg/m2  SpO2 98%  Physical Exam  Constitutional: He is oriented to person, place, and time and well-developed, well-nourished, and in no distress.  HENT:  Head: Normocephalic and atraumatic.  Right Ear: External ear normal.  Left Ear: External ear normal.  Nose: Nose normal.  Mouth/Throat: Oropharynx is clear and moist. No oropharyngeal exudate.  Eyes: Conjunctivae are normal. Pupils are equal, round, and reactive to light.  Neck: Neck supple.  Cardiovascular: Normal rate, regular rhythm, normal heart sounds and intact distal pulses.   Pulmonary/Chest: Effort normal and breath sounds normal. No respiratory distress. He has no wheezes. He has no rales. He exhibits no tenderness.  Neurological: He is alert and oriented to person, place, and time.  Skin: Skin is warm and dry. No rash noted.  Psychiatric: Affect normal.  Vitals reviewed.   Recent Results (from the past 2160 hour(s))  CBC with Differential/Platelet     Status: None   Collection Time: 09/28/15  7:56 AM  Result Value Ref Range   WBC 7.7 4.0 - 10.5 K/uL   RBC 4.44 4.22 - 5.81 MIL/uL   Hemoglobin 14.0 13.0 - 17.0 g/dL   HCT 42.1 39.0 - 52.0 %   MCV 94.8 78.0 - 100.0 fL   MCH 31.5 26.0 - 34.0 pg   MCHC 33.3 30.0 - 36.0 g/dL   RDW 12.6 11.5 - 15.5 %   Platelets 309 150 - 400 K/uL   Neutrophils Relative % 76 %   Neutro Abs 5.8 1.7 - 7.7 K/uL   Lymphocytes Relative 12 %   Lymphs Abs 0.9 0.7 - 4.0 K/uL   Monocytes Relative 9 %   Monocytes Absolute 0.7 0.1 - 1.0  K/uL   Eosinophils Relative 3 %   Eosinophils Absolute 0.2 0.0 - 0.7 K/uL   Basophils Relative 0 %   Basophils Absolute 0.0 0.0 - 0.1 K/uL  Basic metabolic panel     Status: Abnormal   Collection Time: 09/28/15  7:56 AM  Result Value Ref Range   Sodium 134 (L) 135 - 145 mmol/L   Potassium 4.2 3.5 - 5.1 mmol/L   Chloride 101 101 - 111 mmol/L   CO2 26 22 - 32 mmol/L   Glucose, Bld 147 (H) 65 - 99 mg/dL   BUN 10 6 - 20  mg/dL   Creatinine, Ser 1.10 0.61 - 1.24 mg/dL   Calcium 8.9 8.9 - 10.3 mg/dL   GFR calc non Af Amer >60 >60 mL/min   GFR calc Af Amer >60 >60 mL/min    Comment: (NOTE) The eGFR has been calculated using the CKD EPI equation. This calculation has not been validated in all clinical situations. eGFR's persistently <60 mL/min signify possible Chronic Kidney Disease.    Anion gap 7 5 - 15  I-Stat CG4 Lactic Acid, ED     Status: None   Collection Time: 09/28/15  8:10 AM  Result Value Ref Range   Lactic Acid, Venous 1.49 0.5 - 2.0 mmol/L  Urinalysis, Routine w reflex microscopic (not at [facility])     Status: Abnormal   Collection Time: 09/28/15  9:02 AM  Result Value Ref Range   Color, Urine YELLOW YELLOW   APPearance CLOUDY (A) CLEAR   Specific Gravity, Urine 1.013 1.005 - 1.030   pH 5.5 5.0 - 8.0   Glucose, UA NEGATIVE NEGATIVE mg/dL   Hgb urine dipstick NEGATIVE NEGATIVE   Bilirubin Urine NEGATIVE NEGATIVE   Ketones, ur NEGATIVE NEGATIVE mg/dL   Protein, ur NEGATIVE NEGATIVE mg/dL   Nitrite NEGATIVE NEGATIVE   Leukocytes, UA NEGATIVE NEGATIVE    Comment: MICROSCOPIC NOT DONE ON URINES WITH NEGATIVE PROTEIN, BLOOD, LEUKOCYTES, NITRITE, OR GLUCOSE <1000 mg/dL.  Urine culture     Status: None   Collection Time: 09/28/15  9:02 AM  Result Value Ref Range   Specimen Description URINE, CLEAN CATCH    Special Requests NONE    Culture      10,000 COLONIES/mL STAPHYLOCOCCUS SPECIES (COAGULASE NEGATIVE) Performed at [facility]    Report Status 09/30/2015 FINAL    Organism ID, Bacteria STAPHYLOCOCCUS SPECIES (COAGULASE NEGATIVE)       Susceptibility   Staphylococcus species (coagulase negative) - MIC*    CIPROFLOXACIN <=0.5 SENSITIVE Sensitive     GENTAMICIN <=0.5 SENSITIVE Sensitive     NITROFURANTOIN <=16 SENSITIVE Sensitive     OXACILLIN >=4 RESISTANT Resistant     TETRACYCLINE <=1 SENSITIVE Sensitive     VANCOMYCIN <=0.5 SENSITIVE Sensitive     TRIMETH/SULFA <=10 SENSITIVE  Sensitive     CLINDAMYCIN <=0.25 SENSITIVE Sensitive     RIFAMPIN <=0.5 SENSITIVE Sensitive     Inducible Clindamycin NEGATIVE Sensitive     * 10,000 COLONIES/mL STAPHYLOCOCCUS SPECIES (COAGULASE NEGATIVE)  CBC with Differential/Platelet     Status: Abnormal   Collection Time: 10/05/15  1:04 AM  Result Value Ref Range   WBC 4.7 4.0 - 10.5 K/uL   RBC 4.13 (L) 4.22 - 5.81 MIL/uL   Hemoglobin 13.2 13.0 - 17.0 g/dL   HCT 39.2 39.0 - 52.0 %   MCV 94.9 78.0 - 100.0 fL   MCH 32.0 26.0 - 34.0 pg   MCHC 33.7 30.0 - 36.0 g/dL  RDW 12.6 11.5 - 15.5 %   Platelets 290 150 - 400 K/uL   Neutrophils Relative % 48 %   Neutro Abs 2.3 1.7 - 7.7 K/uL   Lymphocytes Relative 33 %   Lymphs Abs 1.5 0.7 - 4.0 K/uL   Monocytes Relative 9 %   Monocytes Absolute 0.4 0.1 - 1.0 K/uL   Eosinophils Relative 9 %   Eosinophils Absolute 0.4 0.0 - 0.7 K/uL   Basophils Relative 1 %   Basophils Absolute 0.0 0.0 - 0.1 K/uL  Comprehensive metabolic panel     Status: Abnormal   Collection Time: 10/05/15  1:04 AM  Result Value Ref Range   Sodium 139 135 - 145 mmol/L   Potassium 4.8 3.5 - 5.1 mmol/L   Chloride 108 101 - 111 mmol/L   CO2 24 22 - 32 mmol/L   Glucose, Bld 109 (H) 65 - 99 mg/dL   BUN 19 6 - 20 mg/dL   Creatinine, Ser 1.26 (H) 0.61 - 1.24 mg/dL   Calcium 9.5 8.9 - 10.3 mg/dL   Total Protein 7.1 6.5 - 8.1 g/dL   Albumin 4.0 3.5 - 5.0 g/dL   AST 28 15 - 41 U/L   ALT 35 17 - 63 U/L   Alkaline Phosphatase 58 38 - 126 U/L   Total Bilirubin 0.5 0.3 - 1.2 mg/dL   GFR calc non Af Amer 58 (L) >60 mL/min   GFR calc Af Amer >60 >60 mL/min    Comment: (NOTE) The eGFR has been calculated using the CKD EPI equation. This calculation has not been validated in all clinical situations. eGFR's persistently <60 mL/min signify possible Chronic Kidney Disease.    Anion gap 7 5 - 15  Lipase, blood     Status: None   Collection Time: 10/05/15  1:04 AM  Result Value Ref Range   Lipase 46 11 - 51 U/L  Urine  culture     Status: None   Collection Time: 10/05/15  1:55 AM  Result Value Ref Range   Specimen Description URINE, CLEAN CATCH    Special Requests NONE    Culture      1,000 COLONIES/mL INSIGNIFICANT GROWTH Performed at [facility]    Report Status 10/06/2015 FINAL   Urinalysis, Routine w reflex microscopic (not at [facility])     Status: None   Collection Time: 10/05/15  1:55 AM  Result Value Ref Range   Color, Urine YELLOW YELLOW   APPearance CLEAR CLEAR   Specific Gravity, Urine 1.019 1.005 - 1.030   pH 5.5 5.0 - 8.0   Glucose, UA NEGATIVE NEGATIVE mg/dL   Hgb urine dipstick NEGATIVE NEGATIVE   Bilirubin Urine NEGATIVE NEGATIVE   Ketones, ur NEGATIVE NEGATIVE mg/dL   Protein, ur NEGATIVE NEGATIVE mg/dL   Nitrite NEGATIVE NEGATIVE   Leukocytes, UA NEGATIVE NEGATIVE    Comment: MICROSCOPIC NOT DONE ON URINES WITH NEGATIVE PROTEIN, BLOOD, LEUKOCYTES, NITRITE, OR GLUCOSE <1000 mg/dL.  Glucose, capillary     Status: Abnormal   Collection Time: 10/25/15  7:45 AM  Result Value Ref Range   Glucose-Capillary 105 (H) 65 - 99 mg/dL   Comment 1 Notify RN   Basic metabolic panel     Status: Abnormal   Collection Time: 10/25/15  8:00 AM  Result Value Ref Range   Sodium 138 135 - 145 mmol/L   Potassium 4.5 3.5 - 5.1 mmol/L   Chloride 105 101 - 111 mmol/L   CO2 24 22 - 32 mmol/L  Glucose, Bld 129 (H) 65 - 99 mg/dL   BUN 13 6 - 20 mg/dL   Creatinine, Ser 1.15 0.61 - 1.24 mg/dL   Calcium 9.7 8.9 - 10.3 mg/dL   GFR calc non Af Amer >60 >60 mL/min   GFR calc Af Amer >60 >60 mL/min    Comment: (NOTE) The eGFR has been calculated using the CKD EPI equation. This calculation has not been validated in all clinical situations. eGFR's persistently <60 mL/min signify possible Chronic Kidney Disease.    Anion gap 9 5 - 15  CBC     Status: None   Collection Time: 10/25/15  8:00 AM  Result Value Ref Range   WBC 6.7 4.0 - 10.5 K/uL   RBC 4.86 4.22 - 5.81 MIL/uL   Hemoglobin 15.5 13.0 -  17.0 g/dL   HCT 44.1 39.0 - 52.0 %   MCV 90.7 78.0 - 100.0 fL   MCH 31.9 26.0 - 34.0 pg   MCHC 35.1 30.0 - 36.0 g/dL   RDW 12.9 11.5 - 15.5 %   Platelets 249 150 - 400 K/uL  Glucose, capillary     Status: Abnormal   Collection Time: 10/25/15 10:51 AM  Result Value Ref Range   Glucose-Capillary 158 (H) 65 - 99 mg/dL  Comprehensive metabolic panel     Status: Abnormal   Collection Time: 10/26/15  6:45 PM  Result Value Ref Range   Sodium 137 135 - 145 mmol/L   Potassium 4.1 3.5 - 5.1 mmol/L   Chloride 101 101 - 111 mmol/L   CO2 27 22 - 32 mmol/L   Glucose, Bld 120 (H) 65 - 99 mg/dL   BUN 18 6 - 20 mg/dL   Creatinine, Ser 1.66 (H) 0.61 - 1.24 mg/dL   Calcium 9.0 8.9 - 10.3 mg/dL   Total Protein 7.6 6.5 - 8.1 g/dL   Albumin 4.4 3.5 - 5.0 g/dL   AST 24 15 - 41 U/L   ALT 31 17 - 63 U/L   Alkaline Phosphatase 58 38 - 126 U/L   Total Bilirubin 0.9 0.3 - 1.2 mg/dL   GFR calc non Af Amer 42 (L) >60 mL/min   GFR calc Af Amer 48 (L) >60 mL/min    Comment: (NOTE) The eGFR has been calculated using the CKD EPI equation. This calculation has not been validated in all clinical situations. eGFR's persistently <60 mL/min signify possible Chronic Kidney Disease.    Anion gap 9 5 - 15  CBC     Status: None   Collection Time: 10/26/15  6:45 PM  Result Value Ref Range   WBC 8.1 4.0 - 10.5 K/uL   RBC 4.31 4.22 - 5.81 MIL/uL   Hemoglobin 13.4 13.0 - 17.0 g/dL   HCT 41.3 39.0 - 52.0 %   MCV 95.8 78.0 - 100.0 fL   MCH 31.1 26.0 - 34.0 pg   MCHC 32.4 30.0 - 36.0 g/dL   RDW 13.2 11.5 - 15.5 %   Platelets 222 150 - 400 K/uL  Troponin I     Status: None   Collection Time: 10/26/15  6:45 PM  Result Value Ref Range   Troponin I <0.03 <0.031 ng/mL    Comment:        NO INDICATION OF MYOCARDIAL INJURY.   Basic Metabolic Panel (BMET)     Status: None   Collection Time: 11/09/15 11:45 AM  Result Value Ref Range   Sodium 137 135 - 145 mEq/L   Potassium 4.3 3.5 -  5.1 mEq/L   Chloride 103 96 -  112 mEq/L   CO2 28 19 - 32 mEq/L   Glucose, Bld 99 70 - 99 mg/dL   BUN 13 6 - 23 mg/dL   Creatinine, Ser 1.12 0.40 - 1.50 mg/dL   Calcium 9.1 8.4 - 10.5 mg/dL   GFR 69.72 >60.00 mL/min  Basic metabolic panel     Status: Abnormal   Collection Time: 11/21/15  7:40 PM  Result Value Ref Range   Sodium 130 (L) 135 - 145 mmol/L   Potassium 4.4 3.5 - 5.1 mmol/L   Chloride 99 (L) 101 - 111 mmol/L   CO2 18 (L) 22 - 32 mmol/L   Glucose, Bld 140 (H) 65 - 99 mg/dL   BUN 15 6 - 20 mg/dL   Creatinine, Ser 1.19 0.61 - 1.24 mg/dL   Calcium 8.8 (L) 8.9 - 10.3 mg/dL   GFR calc non Af Amer >60 >60 mL/min   GFR calc Af Amer >60 >60 mL/min    Comment: (NOTE) The eGFR has been calculated using the CKD EPI equation. This calculation has not been validated in all clinical situations. eGFR's persistently <60 mL/min signify possible Chronic Kidney Disease.    Anion gap 13 5 - 15  CBC with Differential/Platelet     Status: None   Collection Time: 11/21/15  7:40 PM  Result Value Ref Range   WBC 8.1 4.0 - 10.5 K/uL   RBC 4.48 4.22 - 5.81 MIL/uL   Hemoglobin 13.6 13.0 - 17.0 g/dL   HCT 40.6 39.0 - 52.0 %   MCV 90.6 78.0 - 100.0 fL   MCH 30.4 26.0 - 34.0 pg   MCHC 33.5 30.0 - 36.0 g/dL   RDW 13.1 11.5 - 15.5 %   Platelets 268 150 - 400 K/uL   Neutrophils Relative % 76 %   Neutro Abs 6.2 1.7 - 7.7 K/uL   Lymphocytes Relative 16 %   Lymphs Abs 1.3 0.7 - 4.0 K/uL   Monocytes Relative 4 %   Monocytes Absolute 0.3 0.1 - 1.0 K/uL   Eosinophils Relative 3 %   Eosinophils Absolute 0.2 0.0 - 0.7 K/uL   Basophils Relative 1 %   Basophils Absolute 0.1 0.0 - 0.1 K/uL  Ethanol     Status: Abnormal   Collection Time: 11/21/15  7:40 PM  Result Value Ref Range   Alcohol, Ethyl (B) 225 (H) <5 mg/dL    Comment:        LOWEST DETECTABLE LIMIT FOR SERUM ALCOHOL IS 5 mg/dL FOR MEDICAL PURPOSES ONLY   MRSA PCR Screening     Status: None   Collection Time: 11/22/15 12:10 AM  Result Value Ref Range   MRSA by  PCR NEGATIVE NEGATIVE    Comment:        The GeneXpert MRSA Assay (FDA approved for NASAL specimens only), is one component of a comprehensive MRSA colonization surveillance program. It is not intended to diagnose MRSA infection nor to guide or monitor treatment for MRSA infections.   Urine rapid drug screen (hosp performed)     Status: None   Collection Time: 11/22/15 12:20 AM  Result Value Ref Range   Opiates NONE DETECTED NONE DETECTED   Cocaine NONE DETECTED NONE DETECTED   Benzodiazepines NONE DETECTED NONE DETECTED   Amphetamines NONE DETECTED NONE DETECTED   Tetrahydrocannabinol NONE DETECTED NONE DETECTED   Barbiturates NONE DETECTED NONE DETECTED    Comment:        DRUG SCREEN  FOR MEDICAL PURPOSES ONLY.  IF CONFIRMATION IS NEEDED FOR ANY PURPOSE, NOTIFY LAB WITHIN 5 DAYS.        LOWEST DETECTABLE LIMITS FOR URINE DRUG SCREEN Drug Class       Cutoff (ng/mL) Amphetamine      1000 Barbiturate      200 Benzodiazepine   161 Tricyclics       096 Opiates          300 Cocaine          300 THC              50   Creatinine, urine, random     Status: None   Collection Time: 11/22/15 12:20 AM  Result Value Ref Range   Creatinine, Urine 46.27 mg/dL    Comment: Performed at [facility]  Sodium, urine, random     Status: None   Collection Time: 11/22/15 12:20 AM  Result Value Ref Range   Sodium, Ur 11 mmol/L    Comment: Performed at [facility]  Osmolality, urine     Status: Abnormal   Collection Time: 11/22/15 12:20 AM  Result Value Ref Range   Osmolality, Ur 226 (L) 300 - 900 mOsm/kg    Comment: Performed at [facility]  Magnesium     Status: None   Collection Time: 11/22/15  3:58 AM  Result Value Ref Range   Magnesium 2.0 1.7 - 2.4 mg/dL  Phosphorus     Status: None   Collection Time: 11/22/15  3:58 AM  Result Value Ref Range   Phosphorus 3.5 2.5 - 4.6 mg/dL  TSH     Status: None   Collection Time: 11/22/15  3:58 AM  Result Value Ref  Range   TSH 1.626 0.350 - 4.500 uIU/mL  Comprehensive metabolic panel     Status: Abnormal   Collection Time: 11/22/15  3:58 AM  Result Value Ref Range   Sodium 134 (L) 135 - 145 mmol/L   Potassium 3.7 3.5 - 5.1 mmol/L   Chloride 102 101 - 111 mmol/L   CO2 19 (L) 22 - 32 mmol/L   Glucose, Bld 108 (H) 65 - 99 mg/dL   BUN 14 6 - 20 mg/dL   Creatinine, Ser 1.06 0.61 - 1.24 mg/dL   Calcium 8.7 (L) 8.9 - 10.3 mg/dL   Total Protein 7.0 6.5 - 8.1 g/dL   Albumin 4.2 3.5 - 5.0 g/dL   AST 28 15 - 41 U/L   ALT 31 17 - 63 U/L   Alkaline Phosphatase 59 38 - 126 U/L   Total Bilirubin 0.5 0.3 - 1.2 mg/dL   GFR calc non Af Amer >60 >60 mL/min   GFR calc Af Amer >60 >60 mL/min    Comment: (NOTE) The eGFR has been calculated using the CKD EPI equation. This calculation has not been validated in all clinical situations. eGFR's persistently <60 mL/min signify possible Chronic Kidney Disease.    Anion gap 13 5 - 15  CBC     Status: None   Collection Time: 11/22/15  3:58 AM  Result Value Ref Range   WBC 7.5 4.0 - 10.5 K/uL   RBC 4.29 4.22 - 5.81 MIL/uL   Hemoglobin 13.4 13.0 - 17.0 g/dL   HCT 39.5 39.0 - 52.0 %   MCV 92.1 78.0 - 100.0 fL   MCH 31.2 26.0 - 34.0 pg   MCHC 33.9 30.0 - 36.0 g/dL   RDW 13.6 11.5 - 15.5 %  Platelets 253 150 - 400 K/uL  Protime-INR     Status: None   Collection Time: 11/22/15  3:58 AM  Result Value Ref Range   Prothrombin Time 13.9 11.6 - 15.2 seconds   INR 1.05 0.00 - 1.49  Hemoglobin A1c     Status: Abnormal   Collection Time: 11/22/15  3:58 AM  Result Value Ref Range   Hgb A1c MFr Bld 6.8 (H) 4.8 - 5.6 %    Comment: (NOTE)         Pre-diabetes: 5.7 - 6.4         Diabetes: >6.4         Glycemic control for adults with diabetes: <7.0    Mean Plasma Glucose 148 mg/dL    Comment: (NOTE) Performed At: [facility] [address] [place], [place] [id number] Lindon Romp MD HW:[id number]   Urinalysis, Routine w reflex microscopic (not at [facility])      Status: Abnormal   Collection Time: 12/03/15  4:45 PM  Result Value Ref Range   Color, Urine YELLOW YELLOW   APPearance CLOUDY (A) CLEAR   Specific Gravity, Urine 1.023 1.005 - 1.030   pH 5.0 5.0 - 8.0   Glucose, UA NEGATIVE NEGATIVE mg/dL   Hgb urine dipstick MODERATE (A) NEGATIVE   Bilirubin Urine NEGATIVE NEGATIVE   Ketones, ur NEGATIVE NEGATIVE mg/dL   Protein, ur 30 (A) NEGATIVE mg/dL   Nitrite NEGATIVE NEGATIVE   Leukocytes, UA MODERATE (A) NEGATIVE  Urine microscopic-add on     Status: Abnormal   Collection Time: 12/03/15  4:45 PM  Result Value Ref Range   Squamous Epithelial / LPF 0-5 (A) NONE SEEN   WBC, UA 6-30 0 - 5 WBC/hpf   RBC / HPF 0-5 0 - 5 RBC/hpf   Bacteria, UA FEW (A) NONE SEEN  Urine culture     Status: None   Collection Time: 12/03/15  4:45 PM  Result Value Ref Range   Specimen Description URINE, CLEAN CATCH    Special Requests NONE    Culture      2,000 COLONIES/mL INSIGNIFICANT GROWTH Performed at [facility] [place]    Report Status 12/05/2015 FINAL   CBC     Status: None   Collection Time: 12/03/15  5:20 PM  Result Value Ref Range   WBC 5.5 4.0 - 10.5 K/uL   RBC 4.67 4.22 - 5.81 MIL/uL   Hemoglobin 14.3 13.0 - 17.0 g/dL   HCT 42.6 39.0 - 52.0 %   MCV 91.2 78.0 - 100.0 fL   MCH 30.6 26.0 - 34.0 pg   MCHC 33.6 30.0 - 36.0 g/dL   RDW 13.8 11.5 - 15.5 %   Platelets 217 150 - 400 K/uL  Basic metabolic panel     Status: None   Collection Time: 12/03/15  5:20 PM  Result Value Ref Range   Sodium 139 135 - 145 mmol/L   Potassium 3.8 3.5 - 5.1 mmol/L   Chloride 105 101 - 111 mmol/L   CO2 26 22 - 32 mmol/L   Glucose, Bld 94 65 - 99 mg/dL   BUN 12 6 - 20 mg/dL   Creatinine, Ser 1.11 0.61 - 1.24 mg/dL   Calcium 9.0 8.9 - 10.3 mg/dL   GFR calc non Af Amer >60 >60 mL/min   GFR calc Af Amer >60 >60 mL/min    Comment: (NOTE) The eGFR has been calculated using the CKD EPI equation. This calculation has not been validated in all clinical  situations. eGFR's persistently <60 mL/min signify possible Chronic Kidney Disease.    Anion gap 8 5 - 15  Lipase, blood     Status: None   Collection Time: 12/03/15  5:25 PM  Result Value Ref Range   Lipase 45 11 - 51 U/L  Basic Metabolic Panel (BMET)     Status: Abnormal   Collection Time: 12/12/15 12:24 PM  Result Value Ref Range   Sodium 136 135 - 145 mEq/L   Potassium 4.6 3.5 - 5.1 mEq/L   Chloride 104 96 - 112 mEq/L   CO2 26 19 - 32 mEq/L   Glucose, Bld 101 (H) 70 - 99 mg/dL   BUN 17 6 - 23 mg/dL   Creatinine, Ser 1.18 0.40 - 1.50 mg/dL   Calcium 9.3 8.4 - 10.5 mg/dL   GFR 65.63 >60.00 mL/min  Vitamin D (25 hydroxy)     Status: Abnormal   Collection Time: 12/12/15 12:24 PM  Result Value Ref Range   VITD 15.85 (L) 30.00 - 100.00 ng/mL    Assessment/Plan: COPD exacerbation (HCC) Resolved. Continue chronic medications regimen. Follow-up with Pulmonology as scheduled.  Bipolar disorder Will defer further medication management to Psychiatry. Patient is stable at present. Continue current medications.  Alcohol withdrawal ([place]) Resolved. Patient to attend AA meetings and counseling sessions as scheduled. Will continue to monitor -- if any relapse, recommend inpatient program.  Decreased blood calcium Will repeat calcium level today.

## 2015-12-25 NOTE — Assessment & Plan Note (Signed)
Resolved. Patient to attend AA meetings and counseling sessions as scheduled. Will continue to monitor -- if any relapse, recommend inpatient program.

## 2015-12-26 ENCOUNTER — Telehealth: Payer: Self-pay | Admitting: Physician Assistant

## 2015-12-26 NOTE — Telephone Encounter (Signed)
Relation to pt: self Call back number: [phone] Pharmacy:  Reason for call:  Patient in need of clinical advice patient states the orthopedic prescribed predniSONE (DELTASONE) and patient states its messing with his heart and would like to hear from [place].  Patient states he also has a MRI coming up and would like to discuss.

## 2015-12-26 NOTE — Telephone Encounter (Signed)
Attempted patient again and was able to contact him.  Patient endorses chest tenderness after taking prednisone each day (started Friday by Ortho) Denies palpitations, lightheadedness or tenderness. No symptoms at present. Patient instructed to stop medication and contact ortho for alternatives. If anything recurs he is to call 911 or have someone carry him to the ER. Otherwise ok to proceed with Ortho recommendations.

## 2015-12-26 NOTE — Telephone Encounter (Signed)
Attempted to reach patient -- No answer. LMOM for callback.  Please attempt to call patient again.   He is to stop prednisone and call the Orthopedic provider regarding the side effect of medication. Make sure there are no alarm symptoms -- chest pain, dizziness, etc where he would need to go to the ER.   Please assess his concerns regarding MRI.

## 2015-12-28 ENCOUNTER — Ambulatory Visit: Payer: Self-pay | Admitting: Physician Assistant

## 2015-12-28 ENCOUNTER — Telehealth: Payer: Self-pay | Admitting: Physician Assistant

## 2015-12-28 ENCOUNTER — Encounter: Payer: Self-pay | Admitting: Physician Assistant

## 2015-12-28 DIAGNOSIS — M25511 Pain in right shoulder: Secondary | ICD-10-CM | POA: Diagnosis not present

## 2015-12-28 DIAGNOSIS — M542 Cervicalgia: Secondary | ICD-10-CM | POA: Diagnosis not present

## 2015-12-28 NOTE — Telephone Encounter (Signed)
charge 

## 2015-12-28 NOTE — Telephone Encounter (Signed)
Marked to charge and mailing no show letter °

## 2015-12-28 NOTE — Telephone Encounter (Signed)
Patient lvm at 8:390am cancelling his appointment LVM for patient to [facility]. Charge or no charge

## 2016-01-09 DIAGNOSIS — M5412 Radiculopathy, cervical region: Secondary | ICD-10-CM | POA: Diagnosis not present

## 2016-01-11 DIAGNOSIS — C4441 Basal cell carcinoma of skin of scalp and neck: Secondary | ICD-10-CM | POA: Diagnosis not present

## 2016-01-13 ENCOUNTER — Ambulatory Visit (INDEPENDENT_AMBULATORY_CARE_PROVIDER_SITE_OTHER): Payer: Medicare Other | Admitting: Physician Assistant

## 2016-01-13 ENCOUNTER — Encounter: Payer: Self-pay | Admitting: Physician Assistant

## 2016-01-13 VITALS — BP 110/62 | HR 74 | Temp 97.9°F | Ht 72.0 in | Wt 216.6 lb

## 2016-01-13 DIAGNOSIS — M5412 Radiculopathy, cervical region: Secondary | ICD-10-CM

## 2016-01-13 NOTE — Progress Notes (Signed)
Pre visit review using our clinic review tool, if applicable. No additional management support is needed unless otherwise documented below in the visit note. 

## 2016-01-13 NOTE — Patient Instructions (Signed)
Please continue chronic medications as directed.  I would like to add on Gabapentin to help with nerve pain in hopes you will not need further prednisone. You need to discuss this with your specialist since they are the ones managing your cervical radiculopathy. We cannot start an NSAID due to history of shortness of breath. There is too high of a risk to retry one of this when there are other options available.  I will take care of the [place] refills for you with [facility].

## 2016-01-15 DIAGNOSIS — M5412 Radiculopathy, cervical region: Secondary | ICD-10-CM | POA: Insufficient documentation

## 2016-01-15 NOTE — Assessment & Plan Note (Signed)
Is managed by Orthopedics. I am quite confused by his comment that specialist wanted [name] to discuss need for NSAID -- he has documented history of shortness of breath with NSAID use. Patient is unsure what NSAID caused this issue in the past but giving the severe reaction, I will not be restarting any NSAIDs. He is currently on prednisone 40 mg daily by Ortho which I explained is a steroidal anti-inflammatory. He is to follow-up with Ortho regarding further medication management for this condition as they are the treating practice. Did recommend Gabapentin to help with nerve pain from radiculopathy but he is to discuss with Ortho at follow-up this week.

## 2016-01-15 NOTE — Progress Notes (Signed)
Patient presents to clinic today to discuss addition of medications for pain stemming from cervical radiculopathy. Patient was recently assessed by Orthopedics who on imaging have diagnosed him with multiple herniated discs of cervical spine. Patient started on Hydrocodone and Prednisone and instructed to follow-up with PCP to discuss starting an NSAID since Ortho noted patient is allergic to some NSAIDs.   Past Medical History  Diagnosis Date  . Hypertension   . GERD (gastroesophageal reflux disease)   . History of TB (tuberculosis)     1984--  hospitalized for 4 month treatment  . Post-polio syndrome     polio at age 57--PT WAS IN IRON LUNG; PT WAS IN W/C UNTIL AGE 40; STILL HAS WEAKNESS RIGHT SIDE  . History of chronic bronchitis   . History of rheumatic fever   . Bladder outlet obstruction   . Anxiety disorder   . History of urinary retention   . Nocturnal oxygen desaturation     USES O2 NIGHTLY  . Complication of anesthesia     DIFFICULT WAKING   . Diabetes mellitus without complication ([place])     BODERLINE - DIET CONTROL  . Anxiety   . Dysrhythmia     PVC'S  . Arthritis     BILATERAL SHOULDERS, ELBOWS AND HANDS AND LEFT HIP AND KNEES--HX CORTISONE SHOTS IN SHOULDERS, ELBOWS, HIP AND KNEES  . History of oxygen administration     oxygen use 2 l/m nasally at bedtime and exertional occasions  . COPD, frequent exacerbations ([place])     pulmologist-  dr Joya Gaskins--  [place] Rm Stage C.04-25-15 recent COPD exacerbation-much improved now, after tx. in ER [facility].  Marland Kitchen PONV (postoperative nausea and vomiting)   . Family history of adverse reaction to anesthesia     father would wake up with agitation   . Shortness of breath dyspnea     RIGHT HEMIDIAPHRAGM ELEVATION - POST POLIO SYNDROME  . Pneumonia     hx of   . Hx of multiple concussions     x 2 per patient   . Depression   . Schizophrenia ([place])   . Prostate cancer ([place])   . Prostate cancer ([place])   . Melanoma [facility])     Current  Outpatient Prescriptions on File Prior to Visit  Medication Sig Dispense Refill  . albuterol (PROVENTIL HFA;VENTOLIN HFA) 108 (90 Base) MCG/ACT inhaler Inhale 2 puffs into the lungs every 6 (six) hours as needed for wheezing or shortness of breath. 3 Inhaler 3  . ALPRAZolam (XANAX) 1 MG tablet Take 0.5 tablets (0.5 mg total) by mouth 3 (three) times daily as needed for anxiety. (Patient taking differently: Take 1 mg by mouth daily as needed for anxiety. ) 90 tablet 0  . amLODipine (NORVASC) 10 MG tablet Take 1 tablet (10 mg total) by mouth daily. 90 tablet 1  . dexlansoprazole (DEXILANT) 60 MG capsule Take 1 capsule (60 mg total) by mouth daily. 90 capsule 3  . diphenhydrAMINE (SOMINEX) 25 MG tablet Take 25 mg by mouth daily as needed for itching or sleep.    . fluticasone (FLONASE) 50 MCG/ACT nasal spray Place 2 sprays into both nostrils daily. (Patient taking differently: Place 2 sprays into both nostrils daily as needed for allergies. ) 16 g 6  . Fluticasone Furoate-Vilanterol (BREO ELLIPTA) 100-25 MCG/INH AEPB INHALE 1 PUFF INTO THE LUNGS EVERY MORNING. DAILY 180 each 0  . guaiFENesin (MUCINEX) 600 MG 12 hr tablet Take 1 tablet (600 mg total) by mouth 2 (  two) times daily. 15 tablet 0  . ipratropium-albuterol (DUONEB) 0.5-2.5 (3) MG/3ML SOLN Take 3 mLs by nebulization every 4 (four) hours as needed (shortness of breath and wheezing).    Marland Kitchen loratadine (CLARITIN) 10 MG tablet Take 10 mg by mouth daily as needed for allergies.   5  . metoprolol succinate (TOPROL-XL) 25 MG 24 hr tablet Take 1 tablet (25 mg total) by mouth daily. 90 tablet 1  . montelukast (SINGULAIR) 10 MG tablet TAKE 1 TABLET (10 MG TOTAL) BY MOUTH AT BEDTIME. 90 tablet 1  . nitroGLYCERIN (NITROSTAT) 0.4 MG SL tablet Place 1 tablet (0.4 mg total) under the tongue every 5 (five) minutes as needed for chest pain. 30 tablet 3  . oxyCODONE (ROXICODONE) 15 MG immediate release tablet Take 15 mg by mouth every 8 (eight) hours as needed. for  pain  0  . OXYGEN Inhale 2 L into the lungs at bedtime. Reported on 11/25/2015    . polyethylene glycol (MIRALAX / GLYCOLAX) packet Take 17 g by mouth 2 (two) times daily. 20 each 0  . promethazine (PHENERGAN) 25 MG tablet Take 25 mg by mouth every 6 (six) hours as needed for nausea or vomiting.    . tiotropium (SPIRIVA) 18 MCG inhalation capsule Place 1 capsule (18 mcg total) into inhaler and inhale daily. IN AM (Patient taking differently: Place 18 mcg into inhaler and inhale at bedtime. ) 90 capsule 1  . traMADol (ULTRAM-ER) 300 MG 24 hr tablet Take 1 tablet (300 mg total) by mouth daily. 30 tablet 0  . Vitamin D, Ergocalciferol, (DRISDOL) 50000 units CAPS capsule Take 1 capsule (50,000 Units total) by mouth every 7 (seven) days. 12 capsule 0   No current facility-administered medications on file prior to visit.    Allergies  Allergen Reactions  . Ivp Dye [Iodinated Diagnostic Agents] Anaphylaxis  . Levaquin [Levofloxacin In D5w] Shortness Of Breath and Swelling    In addition: sweating, chest pain, and diarrhea.   . Penicillins Anaphylaxis    Heart stops Has patient had a PCN reaction causing immediate rash, facial/tongue/throat swelling, SOB or lightheadedness with hypotension: yes Has patient had a PCN reaction causing severe rash involving mucus membranes or skin necrosis: yes Has patient had a PCN reaction that required hospitalization yes Has patient had a PCN reaction occurring within the last 10 years: No If all of the above answers are "NO", then may proceed with Cephalosporin use.   . Aspirin Other (See Comments)    Reaction unknown  . Nsaids Other (See Comments)    Difficulty breathing  . Tolmetin Other (See Comments)    Difficulty breathing  . Buprenorphine Hcl Nausea And Vomiting    Can take with zofran   . Morphine And Related Nausea And Vomiting    Can take with zofran     Family History  Problem Relation Age of Onset  . Alzheimer's disease Father 4    Deceased   . Stomach cancer Father   . Heart attack Father   . Heart disease Father   . Skin cancer Mother     Facial-Living  . Alcohol abuse Sister     x2  . Mental illness Sister     x2  . Diabetes Maternal Aunt     x2  . Thyroid disease Maternal Aunt     x4  . Diabetes Maternal Uncle   . Tuberculosis Paternal Grandfather   . Tuberculosis Paternal Grandmother   . Alzheimer's disease Paternal Aunt   .  Alzheimer's disease Paternal Uncle   . Colon cancer Neg Hx   . Colon polyps Neg Hx   . Crohn's disease Neg Hx   . Ulcerative colitis Neg Hx     Social History   Social History  . Marital Status: Married    Spouse Name: N/A  . Number of Children: N/A  . Years of Education: N/A   Occupational History  . Retired    Social History Main Topics  . Smoking status: Former Smoker -- 68 years    Quit date: 08/25/2013  . Smokeless tobacco: Former Systems developer  . Alcohol Use: 0.0 oz/week    0 Standard drinks or equivalent per week     Comment: daily  . Drug Use: No     Comment: hx of marijuana use   . Sexual Activity: Not Asked   Other Topics Concern  . None   Social History Narrative    Review of Systems - See HPI.  All other ROS are negative.  BP 110/62 mmHg  Pulse 74  Temp(Src) 97.9 F (36.6 C) (Oral)  Ht 6' (1.829 m)  Wt 216 lb 9.6 oz (98.249 kg)  BMI 29.37 kg/m2  SpO2 97%  Physical Exam  Constitutional: He is oriented to person, place, and time and well-developed, well-nourished, and in no distress.  HENT:  Head: Normocephalic and atraumatic.  Cardiovascular: Normal rate, regular rhythm, normal heart sounds and intact distal pulses.   Pulmonary/Chest: Effort normal and breath sounds normal. No respiratory distress. He has no wheezes. He has no rales. He exhibits no tenderness.  Neurological: He is alert and oriented to person, place, and time.  Skin: Skin is warm and dry. No rash noted.  Psychiatric: Affect normal.  Vitals reviewed.   Recent Results (from the past  2160 hour(s))  Glucose, capillary     Status: Abnormal   Collection Time: 10/25/15  7:45 AM  Result Value Ref Range   Glucose-Capillary 105 (H) 65 - 99 mg/dL   Comment 1 Notify RN   Basic metabolic panel     Status: Abnormal   Collection Time: 10/25/15  8:00 AM  Result Value Ref Range   Sodium 138 135 - 145 mmol/L   Potassium 4.5 3.5 - 5.1 mmol/L   Chloride 105 101 - 111 mmol/L   CO2 24 22 - 32 mmol/L   Glucose, Bld 129 (H) 65 - 99 mg/dL   BUN 13 6 - 20 mg/dL   Creatinine, Ser 1.15 0.61 - 1.24 mg/dL   Calcium 9.7 8.9 - 10.3 mg/dL   GFR calc non Af Amer >60 >60 mL/min   GFR calc Af Amer >60 >60 mL/min    Comment: (NOTE) The eGFR has been calculated using the CKD EPI equation. This calculation has not been validated in all clinical situations. eGFR's persistently <60 mL/min signify possible Chronic Kidney Disease.    Anion gap 9 5 - 15  CBC     Status: None   Collection Time: 10/25/15  8:00 AM  Result Value Ref Range   WBC 6.7 4.0 - 10.5 K/uL   RBC 4.86 4.22 - 5.81 MIL/uL   Hemoglobin 15.5 13.0 - 17.0 g/dL   HCT 44.1 39.0 - 52.0 %   MCV 90.7 78.0 - 100.0 fL   MCH 31.9 26.0 - 34.0 pg   MCHC 35.1 30.0 - 36.0 g/dL   RDW 12.9 11.5 - 15.5 %   Platelets 249 150 - 400 K/uL  Glucose, capillary     Status: Abnormal  Collection Time: 10/25/15 10:51 AM  Result Value Ref Range   Glucose-Capillary 158 (H) 65 - 99 mg/dL  Comprehensive metabolic panel     Status: Abnormal   Collection Time: 10/26/15  6:45 PM  Result Value Ref Range   Sodium 137 135 - 145 mmol/L   Potassium 4.1 3.5 - 5.1 mmol/L   Chloride 101 101 - 111 mmol/L   CO2 27 22 - 32 mmol/L   Glucose, Bld 120 (H) 65 - 99 mg/dL   BUN 18 6 - 20 mg/dL   Creatinine, Ser 1.66 (H) 0.61 - 1.24 mg/dL   Calcium 9.0 8.9 - 10.3 mg/dL   Total Protein 7.6 6.5 - 8.1 g/dL   Albumin 4.4 3.5 - 5.0 g/dL   AST 24 15 - 41 U/L   ALT 31 17 - 63 U/L   Alkaline Phosphatase 58 38 - 126 U/L   Total Bilirubin 0.9 0.3 - 1.2 mg/dL   GFR calc  non Af Amer 42 (L) >60 mL/min   GFR calc Af Amer 48 (L) >60 mL/min    Comment: (NOTE) The eGFR has been calculated using the CKD EPI equation. This calculation has not been validated in all clinical situations. eGFR's persistently <60 mL/min signify possible Chronic Kidney Disease.    Anion gap 9 5 - 15  CBC     Status: None   Collection Time: 10/26/15  6:45 PM  Result Value Ref Range   WBC 8.1 4.0 - 10.5 K/uL   RBC 4.31 4.22 - 5.81 MIL/uL   Hemoglobin 13.4 13.0 - 17.0 g/dL   HCT 41.3 39.0 - 52.0 %   MCV 95.8 78.0 - 100.0 fL   MCH 31.1 26.0 - 34.0 pg   MCHC 32.4 30.0 - 36.0 g/dL   RDW 13.2 11.5 - 15.5 %   Platelets 222 150 - 400 K/uL  Troponin I     Status: None   Collection Time: 10/26/15  6:45 PM  Result Value Ref Range   Troponin I <0.03 <0.031 ng/mL    Comment:        NO INDICATION OF MYOCARDIAL INJURY.   Basic Metabolic Panel (BMET)     Status: None   Collection Time: 11/09/15 11:45 AM  Result Value Ref Range   Sodium 137 135 - 145 mEq/L   Potassium 4.3 3.5 - 5.1 mEq/L   Chloride 103 96 - 112 mEq/L   CO2 28 19 - 32 mEq/L   Glucose, Bld 99 70 - 99 mg/dL   BUN 13 6 - 23 mg/dL   Creatinine, Ser 1.12 0.40 - 1.50 mg/dL   Calcium 9.1 8.4 - 10.5 mg/dL   GFR 69.72 >60.00 mL/min  Basic metabolic panel     Status: Abnormal   Collection Time: 11/21/15  7:40 PM  Result Value Ref Range   Sodium 130 (L) 135 - 145 mmol/L   Potassium 4.4 3.5 - 5.1 mmol/L   Chloride 99 (L) 101 - 111 mmol/L   CO2 18 (L) 22 - 32 mmol/L   Glucose, Bld 140 (H) 65 - 99 mg/dL   BUN 15 6 - 20 mg/dL   Creatinine, Ser 1.19 0.61 - 1.24 mg/dL   Calcium 8.8 (L) 8.9 - 10.3 mg/dL   GFR calc non Af Amer >60 >60 mL/min   GFR calc Af Amer >60 >60 mL/min    Comment: (NOTE) The eGFR has been calculated using the CKD EPI equation. This calculation has not been validated in all clinical situations. eGFR's persistently <  60 mL/min signify possible Chronic Kidney Disease.    Anion gap 13 5 - 15  CBC with  Differential/Platelet     Status: None   Collection Time: 11/21/15  7:40 PM  Result Value Ref Range   WBC 8.1 4.0 - 10.5 K/uL   RBC 4.48 4.22 - 5.81 MIL/uL   Hemoglobin 13.6 13.0 - 17.0 g/dL   HCT 40.6 39.0 - 52.0 %   MCV 90.6 78.0 - 100.0 fL   MCH 30.4 26.0 - 34.0 pg   MCHC 33.5 30.0 - 36.0 g/dL   RDW 13.1 11.5 - 15.5 %   Platelets 268 150 - 400 K/uL   Neutrophils Relative % 76 %   Neutro Abs 6.2 1.7 - 7.7 K/uL   Lymphocytes Relative 16 %   Lymphs Abs 1.3 0.7 - 4.0 K/uL   Monocytes Relative 4 %   Monocytes Absolute 0.3 0.1 - 1.0 K/uL   Eosinophils Relative 3 %   Eosinophils Absolute 0.2 0.0 - 0.7 K/uL   Basophils Relative 1 %   Basophils Absolute 0.1 0.0 - 0.1 K/uL  Ethanol     Status: Abnormal   Collection Time: 11/21/15  7:40 PM  Result Value Ref Range   Alcohol, Ethyl (B) 225 (H) <5 mg/dL    Comment:        LOWEST DETECTABLE LIMIT FOR SERUM ALCOHOL IS 5 mg/dL FOR MEDICAL PURPOSES ONLY   MRSA PCR Screening     Status: None   Collection Time: 11/22/15 12:10 AM  Result Value Ref Range   MRSA by PCR NEGATIVE NEGATIVE    Comment:        The GeneXpert MRSA Assay (FDA approved for NASAL specimens only), is one component of a comprehensive MRSA colonization surveillance program. It is not intended to diagnose MRSA infection nor to guide or monitor treatment for MRSA infections.   Urine rapid drug screen (hosp performed)     Status: None   Collection Time: 11/22/15 12:20 AM  Result Value Ref Range   Opiates NONE DETECTED NONE DETECTED   Cocaine NONE DETECTED NONE DETECTED   Benzodiazepines NONE DETECTED NONE DETECTED   Amphetamines NONE DETECTED NONE DETECTED   Tetrahydrocannabinol NONE DETECTED NONE DETECTED   Barbiturates NONE DETECTED NONE DETECTED    Comment:        DRUG SCREEN FOR MEDICAL PURPOSES ONLY.  IF CONFIRMATION IS NEEDED FOR ANY PURPOSE, NOTIFY LAB WITHIN 5 DAYS.        LOWEST DETECTABLE LIMITS FOR URINE DRUG SCREEN Drug Class       Cutoff  (ng/mL) Amphetamine      1000 Barbiturate      200 Benzodiazepine   341 Tricyclics       962 Opiates          300 Cocaine          300 THC              50   Creatinine, urine, random     Status: None   Collection Time: 11/22/15 12:20 AM  Result Value Ref Range   Creatinine, Urine 46.27 mg/dL    Comment: Performed at [facility]  Sodium, urine, random     Status: None   Collection Time: 11/22/15 12:20 AM  Result Value Ref Range   Sodium, Ur 11 mmol/L    Comment: Performed at [facility]  Osmolality, urine     Status: Abnormal   Collection Time: 11/22/15 12:20 AM  Result Value  Ref Range   Osmolality, Ur 226 (L) 300 - 900 mOsm/kg    Comment: Performed at [facility]  Magnesium     Status: None   Collection Time: 11/22/15  3:58 AM  Result Value Ref Range   Magnesium 2.0 1.7 - 2.4 mg/dL  Phosphorus     Status: None   Collection Time: 11/22/15  3:58 AM  Result Value Ref Range   Phosphorus 3.5 2.5 - 4.6 mg/dL  TSH     Status: None   Collection Time: 11/22/15  3:58 AM  Result Value Ref Range   TSH 1.626 0.350 - 4.500 uIU/mL  Comprehensive metabolic panel     Status: Abnormal   Collection Time: 11/22/15  3:58 AM  Result Value Ref Range   Sodium 134 (L) 135 - 145 mmol/L   Potassium 3.7 3.5 - 5.1 mmol/L   Chloride 102 101 - 111 mmol/L   CO2 19 (L) 22 - 32 mmol/L   Glucose, Bld 108 (H) 65 - 99 mg/dL   BUN 14 6 - 20 mg/dL   Creatinine, Ser 1.06 0.61 - 1.24 mg/dL   Calcium 8.7 (L) 8.9 - 10.3 mg/dL   Total Protein 7.0 6.5 - 8.1 g/dL   Albumin 4.2 3.5 - 5.0 g/dL   AST 28 15 - 41 U/L   ALT 31 17 - 63 U/L   Alkaline Phosphatase 59 38 - 126 U/L   Total Bilirubin 0.5 0.3 - 1.2 mg/dL   GFR calc non Af Amer >60 >60 mL/min   GFR calc Af Amer >60 >60 mL/min    Comment: (NOTE) The eGFR has been calculated using the CKD EPI equation. This calculation has not been validated in all clinical situations. eGFR's persistently <60 mL/min signify possible Chronic  Kidney Disease.    Anion gap 13 5 - 15  CBC     Status: None   Collection Time: 11/22/15  3:58 AM  Result Value Ref Range   WBC 7.5 4.0 - 10.5 K/uL   RBC 4.29 4.22 - 5.81 MIL/uL   Hemoglobin 13.4 13.0 - 17.0 g/dL   HCT 39.5 39.0 - 52.0 %   MCV 92.1 78.0 - 100.0 fL   MCH 31.2 26.0 - 34.0 pg   MCHC 33.9 30.0 - 36.0 g/dL   RDW 13.6 11.5 - 15.5 %   Platelets 253 150 - 400 K/uL  Protime-INR     Status: None   Collection Time: 11/22/15  3:58 AM  Result Value Ref Range   Prothrombin Time 13.9 11.6 - 15.2 seconds   INR 1.05 0.00 - 1.49  Hemoglobin A1c     Status: Abnormal   Collection Time: 11/22/15  3:58 AM  Result Value Ref Range   Hgb A1c MFr Bld 6.8 (H) 4.8 - 5.6 %    Comment: (NOTE)         Pre-diabetes: 5.7 - 6.4         Diabetes: >6.4         Glycemic control for adults with diabetes: <7.0    Mean Plasma Glucose 148 mg/dL    Comment: (NOTE) Performed At: [facility] [address] [place], [place] [id number] Lindon Romp MD [id number]   Urinalysis, Routine w reflex microscopic (not at [facility])     Status: Abnormal   Collection Time: 12/03/15  4:45 PM  Result Value Ref Range   Color, Urine YELLOW YELLOW   APPearance CLOUDY (A) CLEAR   Specific Gravity, Urine 1.023 1.005 - 1.030  pH 5.0 5.0 - 8.0   Glucose, UA NEGATIVE NEGATIVE mg/dL   Hgb urine dipstick MODERATE (A) NEGATIVE   Bilirubin Urine NEGATIVE NEGATIVE   Ketones, ur NEGATIVE NEGATIVE mg/dL   Protein, ur 30 (A) NEGATIVE mg/dL   Nitrite NEGATIVE NEGATIVE   Leukocytes, UA MODERATE (A) NEGATIVE  Urine microscopic-add on     Status: Abnormal   Collection Time: 12/03/15  4:45 PM  Result Value Ref Range   Squamous Epithelial / LPF 0-5 (A) NONE SEEN   WBC, UA 6-30 0 - 5 WBC/hpf   RBC / HPF 0-5 0 - 5 RBC/hpf   Bacteria, UA FEW (A) NONE SEEN  Urine culture     Status: None   Collection Time: 12/03/15  4:45 PM  Result Value Ref Range   Specimen Description URINE, CLEAN CATCH    Special Requests  NONE    Culture      2,000 COLONIES/mL INSIGNIFICANT GROWTH Performed at [facility]    Report Status 12/05/2015 FINAL   CBC     Status: None   Collection Time: 12/03/15  5:20 PM  Result Value Ref Range   WBC 5.5 4.0 - 10.5 K/uL   RBC 4.67 4.22 - 5.81 MIL/uL   Hemoglobin 14.3 13.0 - 17.0 g/dL   HCT 42.6 39.0 - 52.0 %   MCV 91.2 78.0 - 100.0 fL   MCH 30.6 26.0 - 34.0 pg   MCHC 33.6 30.0 - 36.0 g/dL   RDW 13.8 11.5 - 15.5 %   Platelets 217 150 - 400 K/uL  Basic metabolic panel     Status: None   Collection Time: 12/03/15  5:20 PM  Result Value Ref Range   Sodium 139 135 - 145 mmol/L   Potassium 3.8 3.5 - 5.1 mmol/L   Chloride 105 101 - 111 mmol/L   CO2 26 22 - 32 mmol/L   Glucose, Bld 94 65 - 99 mg/dL   BUN 12 6 - 20 mg/dL   Creatinine, Ser 1.11 0.61 - 1.24 mg/dL   Calcium 9.0 8.9 - 10.3 mg/dL   GFR calc non Af Amer >60 >60 mL/min   GFR calc Af Amer >60 >60 mL/min    Comment: (NOTE) The eGFR has been calculated using the CKD EPI equation. This calculation has not been validated in all clinical situations. eGFR's persistently <60 mL/min signify possible Chronic Kidney Disease.    Anion gap 8 5 - 15  Lipase, blood     Status: None   Collection Time: 12/03/15  5:25 PM  Result Value Ref Range   Lipase 45 11 - 51 U/L  Basic Metabolic Panel (BMET)     Status: Abnormal   Collection Time: 12/12/15 12:24 PM  Result Value Ref Range   Sodium 136 135 - 145 mEq/L   Potassium 4.6 3.5 - 5.1 mEq/L   Chloride 104 96 - 112 mEq/L   CO2 26 19 - 32 mEq/L   Glucose, Bld 101 (H) 70 - 99 mg/dL   BUN 17 6 - 23 mg/dL   Creatinine, Ser 1.18 0.40 - 1.50 mg/dL   Calcium 9.3 8.4 - 10.5 mg/dL   GFR 65.63 >60.00 mL/min  Vitamin D (25 hydroxy)     Status: Abnormal   Collection Time: 12/12/15 12:24 PM  Result Value Ref Range   VITD 15.85 (L) 30.00 - 100.00 ng/mL    Assessment/Plan: Cervical radiculopathy Is managed by Orthopedics. I am quite confused by his comment that specialist  wanted [name] to  discuss need for NSAID -- he has documented history of shortness of breath with NSAID use. Patient is unsure what NSAID caused this issue in the past but giving the severe reaction, I will not be restarting any NSAIDs. He is currently on prednisone 40 mg daily by Ortho which I explained is a steroidal anti-inflammatory. He is to follow-up with Ortho regarding further medication management for this condition as they are the treating practice. Did recommend Gabapentin to help with nerve pain from radiculopathy but he is to discuss with Ortho at follow-up this week.

## 2016-01-19 ENCOUNTER — Ambulatory Visit: Payer: Medicare Other | Admitting: Family Medicine

## 2016-01-23 DIAGNOSIS — M542 Cervicalgia: Secondary | ICD-10-CM | POA: Diagnosis not present

## 2016-01-23 DIAGNOSIS — M4722 Other spondylosis with radiculopathy, cervical region: Secondary | ICD-10-CM | POA: Diagnosis not present

## 2016-01-23 DIAGNOSIS — M5412 Radiculopathy, cervical region: Secondary | ICD-10-CM | POA: Diagnosis not present

## 2016-01-25 ENCOUNTER — Ambulatory Visit (INDEPENDENT_AMBULATORY_CARE_PROVIDER_SITE_OTHER): Payer: Self-pay | Admitting: Clinical

## 2016-01-25 ENCOUNTER — Ambulatory Visit: Payer: Medicare Other | Admitting: Family Medicine

## 2016-01-25 ENCOUNTER — Encounter (HOSPITAL_COMMUNITY): Payer: Self-pay | Admitting: Clinical

## 2016-01-25 DIAGNOSIS — F603 Borderline personality disorder: Secondary | ICD-10-CM

## 2016-01-25 DIAGNOSIS — F3162 Bipolar disorder, current episode mixed, moderate: Secondary | ICD-10-CM

## 2016-01-25 DIAGNOSIS — F1020 Alcohol dependence, uncomplicated: Secondary | ICD-10-CM

## 2016-01-26 DIAGNOSIS — M5091 Cervical disc disorder, unspecified,  high cervical region: Secondary | ICD-10-CM | POA: Diagnosis not present

## 2016-01-26 DIAGNOSIS — M79601 Pain in right arm: Secondary | ICD-10-CM | POA: Diagnosis not present

## 2016-01-30 ENCOUNTER — Encounter (HOSPITAL_COMMUNITY): Payer: Self-pay | Admitting: Clinical
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
## 2016-01-31 DIAGNOSIS — M79601 Pain in right arm: Secondary | ICD-10-CM | POA: Diagnosis not present

## 2016-01-31 DIAGNOSIS — M5091 Cervical disc disorder, unspecified,  high cervical region: Secondary | ICD-10-CM | POA: Diagnosis not present

## 2016-02-06 ENCOUNTER — Encounter (HOSPITAL_COMMUNITY): Payer: Self-pay | Admitting: Clinical

## 2016-02-06 ENCOUNTER — Ambulatory Visit (INDEPENDENT_AMBULATORY_CARE_PROVIDER_SITE_OTHER): Payer: PRIVATE HEALTH INSURANCE | Admitting: Clinical

## 2016-02-06 DIAGNOSIS — F603 Borderline personality disorder: Secondary | ICD-10-CM

## 2016-02-06 DIAGNOSIS — F3162 Bipolar disorder, current episode mixed, moderate: Secondary | ICD-10-CM | POA: Diagnosis not present

## 2016-02-06 DIAGNOSIS — F1020 Alcohol dependence, uncomplicated: Secondary | ICD-10-CM

## 2016-02-06 NOTE — Progress Notes (Signed)
   THERAPIST PROGRESS NOTE  Session Time: 7:08 -8:05  Participation Level: Active  Behavioral Response: CasualAlertAnxious  Type of Therapy: Individual Therapy  Treatment Goals addressed: improve psychiatric symptoms,  improve unhelpful thought patterns, Emotional regulation skills,  harm reduction skills  Interventions: CBT and Motivational Interviewing, Grounding and Mindfulness Techniques, psychoeducation  Summary: [name] is a 65  y.o. male who presents with Bipolar 1 disorder, and Alcohol use disorder, moderate, dependence, and explosive personality disorder  Suicidal/Homicidal: No -without intent/plan  Therapist Response:  [name] met with clinician for an individual session. [name] discussed his psychiatric symptoms, his current life events, and his goals for therapy. [name] shared that he has not been drinking as much as usual. He shared that he has a few beers daily. He shared that he does not plan on quitting but would be interested in cutting back some. Clinician asked open ended questions and he shared that most of the major incidents he has had involved alcohol. Clinician shared with him how alcohol can affect his psychiatric symptoms(including depression, insomnia, stress, and anger). Client and clinician discussed some basic harm reduction skills. Clinician introduced grounding and mindfulness techniques. Client and clinician discussed the application, practice and purpose. Client and clinician practiced some of the techniques together. [name] and clinician discussed the importance of practicing his skills between sessions. [name] agreed to practice daily until next session. Clinician introduced some basic cbt skills. Client and clinician discussed how our thoughts affect our mood and our perceptions, and actions. Clinician introduced a homework packet which [name] agreed to complete and bring back with him to next session.   Plan: Return again in 1  weeks.  Diagnosis: Axis I: Bipolar 1 disorder, and Alcohol use disorder, moderate, dependence, and explosive personality disorder    [name] A, LCSW 02/06/2016

## 2016-02-09 DIAGNOSIS — M79601 Pain in right arm: Secondary | ICD-10-CM | POA: Diagnosis not present

## 2016-02-09 DIAGNOSIS — M5091 Cervical disc disorder, unspecified,  high cervical region: Secondary | ICD-10-CM | POA: Diagnosis not present

## 2016-02-21 ENCOUNTER — Encounter: Payer: Self-pay | Admitting: Physician Assistant

## 2016-02-21 ENCOUNTER — Ambulatory Visit (INDEPENDENT_AMBULATORY_CARE_PROVIDER_SITE_OTHER): Payer: Medicare Other | Admitting: Physician Assistant

## 2016-02-21 VITALS — BP 108/76 | HR 76 | Temp 97.9°F | Resp 18 | Ht 72.0 in | Wt 225.0 lb

## 2016-02-21 DIAGNOSIS — F419 Anxiety disorder, unspecified: Secondary | ICD-10-CM | POA: Diagnosis not present

## 2016-02-21 DIAGNOSIS — J069 Acute upper respiratory infection, unspecified: Secondary | ICD-10-CM

## 2016-02-21 DIAGNOSIS — M170 Bilateral primary osteoarthritis of knee: Secondary | ICD-10-CM | POA: Diagnosis not present

## 2016-02-21 MED ORDER — HYDROCODONE-ACETAMINOPHEN 5-325 MG PO TABS: 1.0000 | ORAL_TABLET | Freq: Four times a day (QID) | ORAL | Status: DC | PRN

## 2016-02-21 MED ORDER — GABAPENTIN 300 MG PO CAPS: 300.0000 mg | ORAL_CAPSULE | Freq: Three times a day (TID) | ORAL | Status: DC

## 2016-02-21 MED ORDER — ONDANSETRON HCL 8 MG PO TABS: 8.0000 mg | ORAL_TABLET | Freq: Once | ORAL | Status: DC

## 2016-02-21 MED ORDER — HYDROCODONE-ACETAMINOPHEN 5-325 MG PO TABS: 1.0000 | ORAL_TABLET | ORAL | Status: DC | PRN

## 2016-02-21 MED ORDER — ALPRAZOLAM 1 MG PO TABS: 0.5000 mg | ORAL_TABLET | Freq: Three times a day (TID) | ORAL | Status: DC | PRN

## 2016-02-21 NOTE — Progress Notes (Signed)
Patient presents to clinic today with acute concern and to review chronic medical issues.  Patient endorses 2 days of cough that is now productive of sputum. Sputum is clear and thin. Denies fever, chills. Denies change in breathing from baseline. Is taking his COPD medications as directed. Has upcoming appt with Pulmonology.  Patient with history of explosive personality disorder. Is no followed by Psychology for therapy sessions which he feels is beneficial. Is currently prescribed xanax prn for severe anxiety. Has not required this medication as much recently. Patient with history of alcohol abuse. Has restarted drinking a few drinks per day.   Patient with history of chronic pain, most significant in knees bilaterally 2/2 OA. Is followed by Orthopedics at [facility]. Is taking Hydrocodone as directed. Is taking Gabapentin as directed with some improvement in symptoms.   Past Medical History  Diagnosis Date  . Hypertension   . GERD (gastroesophageal reflux disease)   . History of TB (tuberculosis)     1984--  hospitalized for 4 month treatment  . Post-polio syndrome     polio at age 36--PT WAS IN IRON LUNG; PT WAS IN W/C UNTIL AGE 35; STILL HAS WEAKNESS RIGHT SIDE  . History of chronic bronchitis   . History of rheumatic fever   . Bladder outlet obstruction   . Anxiety disorder   . History of urinary retention   . Nocturnal oxygen desaturation     USES O2 NIGHTLY  . Complication of anesthesia     DIFFICULT WAKING   . Diabetes mellitus without complication ([place])     BODERLINE - DIET CONTROL  . Anxiety   . Dysrhythmia     PVC'S  . Arthritis     BILATERAL SHOULDERS, ELBOWS AND HANDS AND LEFT HIP AND KNEES--HX CORTISONE SHOTS IN SHOULDERS, ELBOWS, HIP AND KNEES  . History of oxygen administration     oxygen use 2 l/m nasally at bedtime and exertional occasions  . COPD, frequent exacerbations ([place])     pulmologist-  dr Joya Gaskins--  [place] Rm Stage C.04-25-15 recent COPD  exacerbation-much improved now, after tx. in ER [facility].  Marland Kitchen PONV (postoperative nausea and vomiting)   . Family history of adverse reaction to anesthesia     father would wake up with agitation   . Shortness of breath dyspnea     RIGHT HEMIDIAPHRAGM ELEVATION - POST POLIO SYNDROME  . Pneumonia     hx of   . Hx of multiple concussions     x 2 per patient   . Depression   . Schizophrenia ([place])   . Prostate cancer ([place])   . Prostate cancer ([place])   . Melanoma [facility])     Current Outpatient Prescriptions on File Prior to Visit  Medication Sig Dispense Refill  . albuterol (PROVENTIL HFA;VENTOLIN HFA) 108 (90 Base) MCG/ACT inhaler Inhale 2 puffs into the lungs every 6 (six) hours as needed for wheezing or shortness of breath. 3 Inhaler 3  . ALPRAZolam (XANAX) 1 MG tablet Take 0.5 tablets (0.5 mg total) by mouth 3 (three) times daily as needed for anxiety. (Patient taking differently: Take 1 mg by mouth daily as needed for anxiety. ) 90 tablet 0  . amLODipine (NORVASC) 10 MG tablet Take 1 tablet (10 mg total) by mouth daily. 90 tablet 1  . dexlansoprazole (DEXILANT) 60 MG capsule Take 1 capsule (60 mg total) by mouth daily. 90 capsule 3  . diphenhydrAMINE (SOMINEX) 25 MG tablet Take 25 mg by mouth daily as  needed for itching or sleep. Reported on 01/25/2016    . fluticasone (FLONASE) 50 MCG/ACT nasal spray Place 2 sprays into both nostrils daily. (Patient taking differently: Place 2 sprays into both nostrils daily as needed for allergies. ) 16 g 6  . Fluticasone Furoate-Vilanterol (BREO ELLIPTA) 100-25 MCG/INH AEPB INHALE 1 PUFF INTO THE LUNGS EVERY MORNING. DAILY 180 each 0  . guaiFENesin (MUCINEX) 600 MG 12 hr tablet Take 1 tablet (600 mg total) by mouth 2 (two) times daily. (Patient not taking: Reported on 01/25/2016) 15 tablet 0  . HYDROcodone-acetaminophen (NORCO/VICODIN) 5-325 MG tablet Take 1 tablet by mouth as needed for moderate pain.    Marland Kitchen ipratropium-albuterol (DUONEB) 0.5-2.5 (3)  MG/3ML SOLN Take 3 mLs by nebulization every 4 (four) hours as needed (shortness of breath and wheezing).    Marland Kitchen loratadine (CLARITIN) 10 MG tablet Take 10 mg by mouth daily as needed for allergies. Reported on 01/25/2016  5  . meclizine (ANTIVERT) 25 MG tablet Take 1 tablet by mouth daily. Reported on 01/25/2016    . metoprolol succinate (TOPROL-XL) 25 MG 24 hr tablet Take 1 tablet (25 mg total) by mouth daily. 90 tablet 1  . montelukast (SINGULAIR) 10 MG tablet TAKE 1 TABLET (10 MG TOTAL) BY MOUTH AT BEDTIME. 90 tablet 1  . nitroGLYCERIN (NITROSTAT) 0.4 MG SL tablet Place 1 tablet (0.4 mg total) under the tongue every 5 (five) minutes as needed for chest pain. 30 tablet 3  . OXYGEN Inhale 2 L into the lungs at bedtime. Reported on 11/25/2015    . polyethylene glycol (MIRALAX / GLYCOLAX) packet Take 17 g by mouth 2 (two) times daily. (Patient not taking: Reported on 01/25/2016) 20 each 0  . promethazine (PHENERGAN) 25 MG tablet Take 25 mg by mouth every 6 (six) hours as needed for nausea or vomiting. Reported on 01/25/2016    . tiotropium (SPIRIVA) 18 MCG inhalation capsule Place 1 capsule (18 mcg total) into inhaler and inhale daily. IN AM (Patient taking differently: Place 18 mcg into inhaler and inhale at bedtime. ) 90 capsule 1  . Vitamin D, Ergocalciferol, (DRISDOL) 50000 units CAPS capsule Take 1 capsule (50,000 Units total) by mouth every 7 (seven) days. 12 capsule 0   No current facility-administered medications on file prior to visit.    Allergies  Allergen Reactions  . Ivp Dye [Iodinated Diagnostic Agents] Anaphylaxis  . Levaquin [Levofloxacin In D5w] Shortness Of Breath and Swelling    In addition: sweating, chest pain, and diarrhea.   . Penicillins Anaphylaxis    Heart stops Has patient had a PCN reaction causing immediate rash, facial/tongue/throat swelling, SOB or lightheadedness with hypotension: yes Has patient had a PCN reaction causing severe rash involving mucus membranes or skin  necrosis: yes Has patient had a PCN reaction that required hospitalization yes Has patient had a PCN reaction occurring within the last 10 years: No If all of the above answers are "NO", then may proceed with Cephalosporin use.   . Aspirin Other (See Comments)    Reaction unknown  . Nsaids Other (See Comments)    Difficulty breathing  . Tolmetin Other (See Comments)    Difficulty breathing  . Buprenorphine Hcl Nausea And Vomiting    Can take with zofran   . Morphine And Related Nausea And Vomiting    Can take with zofran   . Oxycodone Itching and Rash    Family History  Problem Relation Age of Onset  . Alzheimer's disease Father 64  Deceased  . Stomach cancer Father   . Heart attack Father   . Heart disease Father   . Skin cancer Mother     Facial-Living  . Alcohol abuse Sister     x2  . Mental illness Sister     x2  . Diabetes Maternal Aunt     x2  . Thyroid disease Maternal Aunt     x4  . Diabetes Maternal Uncle   . Tuberculosis Paternal Grandfather   . Tuberculosis Paternal Grandmother   . Alzheimer's disease Paternal Aunt   . Alzheimer's disease Paternal Uncle   . Colon cancer Neg Hx   . Colon polyps Neg Hx   . Crohn's disease Neg Hx   . Ulcerative colitis Neg Hx     Social History   Social History  . Marital Status: Married    Spouse Name: N/A  . Number of Children: N/A  . Years of Education: N/A   Occupational History  . Retired    Social History Main Topics  . Smoking status: Former Smoker -- 97 years    Quit date: 08/25/2013  . Smokeless tobacco: Former Systems developer  . Alcohol Use: 0.0 oz/week    0 Standard drinks or equivalent per week     Comment: daily  . Drug Use: No     Comment: hx of marijuana use   . Sexual Activity: Not Currently   Other Topics Concern  . Not on file   Social History Narrative    Review of Systems - See HPI.  All other ROS are negative.  There were no vitals taken for this visit.  Physical Exam  Constitutional:  He is oriented to person, place, and time and well-developed, well-nourished, and in no distress.  HENT:  Head: Normocephalic and atraumatic.  Right Ear: External ear normal.  Left Ear: External ear normal.  Nose: Nose normal.  Mouth/Throat: Oropharynx is clear and moist. No oropharyngeal exudate.  TM within normal limits bilaterally.  Eyes: Conjunctivae are normal.  Neck: Neck supple.  Cardiovascular: Normal rate, regular rhythm, normal heart sounds and intact distal pulses.   Pulmonary/Chest: Effort normal and breath sounds normal. No respiratory distress. He has no wheezes. He has no rales. He exhibits no tenderness.  Neurological: He is alert and oriented to person, place, and time.  Skin: Skin is warm and dry. No rash noted.  Psychiatric: Affect normal.  Vitals reviewed.   Recent Results (from the past 2160 hour(s))  Urinalysis, Routine w reflex microscopic (not at [facility])     Status: Abnormal   Collection Time: 12/03/15  4:45 PM  Result Value Ref Range   Color, Urine YELLOW YELLOW   APPearance CLOUDY (A) CLEAR   Specific Gravity, Urine 1.023 1.005 - 1.030   pH 5.0 5.0 - 8.0   Glucose, UA NEGATIVE NEGATIVE mg/dL   Hgb urine dipstick MODERATE (A) NEGATIVE   Bilirubin Urine NEGATIVE NEGATIVE   Ketones, ur NEGATIVE NEGATIVE mg/dL   Protein, ur 30 (A) NEGATIVE mg/dL   Nitrite NEGATIVE NEGATIVE   Leukocytes, UA MODERATE (A) NEGATIVE  Urine microscopic-add on     Status: Abnormal   Collection Time: 12/03/15  4:45 PM  Result Value Ref Range   Squamous Epithelial / LPF 0-5 (A) NONE SEEN   WBC, UA 6-30 0 - 5 WBC/hpf   RBC / HPF 0-5 0 - 5 RBC/hpf   Bacteria, UA FEW (A) NONE SEEN  Urine culture     Status: None   Collection Time:  12/03/15  4:45 PM  Result Value Ref Range   Specimen Description URINE, CLEAN CATCH    Special Requests NONE    Culture      2,000 COLONIES/mL INSIGNIFICANT GROWTH Performed at [facility]    Report Status 12/05/2015 FINAL   CBC     Status:  None   Collection Time: 12/03/15  5:20 PM  Result Value Ref Range   WBC 5.5 4.0 - 10.5 K/uL   RBC 4.67 4.22 - 5.81 MIL/uL   Hemoglobin 14.3 13.0 - 17.0 g/dL   HCT 42.6 39.0 - 52.0 %   MCV 91.2 78.0 - 100.0 fL   MCH 30.6 26.0 - 34.0 pg   MCHC 33.6 30.0 - 36.0 g/dL   RDW 13.8 11.5 - 15.5 %   Platelets 217 150 - 400 K/uL  Basic metabolic panel     Status: None   Collection Time: 12/03/15  5:20 PM  Result Value Ref Range   Sodium 139 135 - 145 mmol/L   Potassium 3.8 3.5 - 5.1 mmol/L   Chloride 105 101 - 111 mmol/L   CO2 26 22 - 32 mmol/L   Glucose, Bld 94 65 - 99 mg/dL   BUN 12 6 - 20 mg/dL   Creatinine, Ser 1.11 0.61 - 1.24 mg/dL   Calcium 9.0 8.9 - 10.3 mg/dL   GFR calc non Af Amer >60 >60 mL/min   GFR calc Af Amer >60 >60 mL/min    Comment: (NOTE) The eGFR has been calculated using the CKD EPI equation. This calculation has not been validated in all clinical situations. eGFR's persistently <60 mL/min signify possible Chronic Kidney Disease.    Anion gap 8 5 - 15  Lipase, blood     Status: None   Collection Time: 12/03/15  5:25 PM  Result Value Ref Range   Lipase 45 11 - 51 U/L  Basic Metabolic Panel (BMET)     Status: Abnormal   Collection Time: 12/12/15 12:24 PM  Result Value Ref Range   Sodium 136 135 - 145 mEq/L   Potassium 4.6 3.5 - 5.1 mEq/L   Chloride 104 96 - 112 mEq/L   CO2 26 19 - 32 mEq/L   Glucose, Bld 101 (H) 70 - 99 mg/dL   BUN 17 6 - 23 mg/dL   Creatinine, Ser 1.18 0.40 - 1.50 mg/dL   Calcium 9.3 8.4 - 10.5 mg/dL   GFR 65.63 >60.00 mL/min  Vitamin D (25 hydroxy)     Status: Abnormal   Collection Time: 12/12/15 12:24 PM  Result Value Ref Range   VITD 15.85 (L) 30.00 - 100.00 ng/mL   Assessment/Plan: 1. Anxiety Giving recurrence of alcohol use and ongoing symptoms, will refer to psychiatry. Continue counseling sessions. Will allow one refill of the Xanax until Psychiatry takes over. AA again recommended to patient. Will not continue to prescribe  controlled medications if he resumes significant drinking.  - ALPRAZolam (XANAX) 1 MG tablet; Take 0.5 tablets (0.5 mg total) by mouth 3 (three) times daily as needed for anxiety.  Dispense: 90 tablet; Refill: 0  2. Primary osteoarthritis of both knees Referral to pain management placed. Continue FU with Orthopedics.  3. Viral URI Supportive measures reviewed. Continue COPD medications. Plain Mucinex.  Leeanne Rio, PA-C

## 2016-02-21 NOTE — Progress Notes (Signed)
Pre visit review using our clinic review tool, if applicable. No additional management support is needed unless otherwise documented below in the visit note/SLS  

## 2016-02-21 NOTE — Patient Instructions (Signed)
Please continue chronic medications as directed. Follow-up with Pulmonology, Gastroenterology and Orthopedic Surgery as scheduled. Increase fluids. Restart Mucinex. Call me if symptoms are not improving.  Take pain medication as directed. I am setting you up with integrative pain medicine for further assessment.

## 2016-02-23 ENCOUNTER — Encounter (HOSPITAL_COMMUNITY): Payer: Self-pay | Admitting: Clinical

## 2016-02-23 ENCOUNTER — Ambulatory Visit (INDEPENDENT_AMBULATORY_CARE_PROVIDER_SITE_OTHER): Payer: Medicare Other | Admitting: Clinical

## 2016-02-23 DIAGNOSIS — F1020 Alcohol dependence, uncomplicated: Secondary | ICD-10-CM

## 2016-02-23 DIAGNOSIS — F3162 Bipolar disorder, current episode mixed, moderate: Secondary | ICD-10-CM | POA: Diagnosis not present

## 2016-02-23 DIAGNOSIS — F603 Borderline personality disorder: Secondary | ICD-10-CM | POA: Diagnosis not present

## 2016-02-23 NOTE — Progress Notes (Signed)
   THERAPIST PROGRESS NOTE  Session Time: 9:04 -10:00  Participation Level: Active  Behavioral Response: CasualAlertAnxious  Type of Therapy: Individual Therapy  Treatment Goals addressed: improve psychiatric symptoms, improve unhelpful thought patterns, Emotional regulation skills (moderate moods, stress management, anger management),  healthy coping skills, harm reduction skills  Interventions: CBT and Motivational Interviewing,  psychoeducation  Summary: [name] is a 65  y.o. male who presents with Bipolar 1 disorder, and Alcohol use disorder, moderate, dependence, and explosive personality disorder  Suicidal/Homicidal: No -without intent/plan  Therapist ResponseTommie Raymond met with clinician for an individual session. [name] discussed his psychiatric symptoms, his current life events, and his homework. [name] shared that he has been feeling a lot of regret and shame lately. He spoke about some of his regrets. Client and clinician discussed what is and is not in his power to change. Client and clinician discussed acceptance of past behaviors. [name] shared that he had not completed his homework packet but would before next session and would bring it with him. He shared that he had more days without alcohol this past week. Client and clinician discussed whether or not [name] would like to quit drinking. Client and clinician discussed the fact that he would need to work with medical staff to monitor his withdrawal symptoms. Client and clinician discussed the different resources available. [name] shared that he is not currently ready to stop but that he is thinking about it. [name] shared about some of the events that he often finds stressful. He shared about losing his cool and then regretting it. Client and clinician discussed his negative automatic thoughts and his desired outcome. [name] had the insight that his behaviors ( based on his thoughts) were not producing his desired  outcome. Client and clinician discussed how to challenge and change his thoughts to be more in alignment with his desired outcome. Client and clinician discussed how these changes might help decrease his anger and stress. [name] shared that he did not practice his grounding or mindfulness techniques. Clinician explained the purpose and practice of the techniques and [name] agreed to practice them this week.  Plan: Return again in 1-2 weeks.  Diagnosis: Axis I: Bipolar 1 disorder, and Alcohol use disorder, moderate, dependence, and explosive personality disorder   Sejla Marzano A, LCSW 02/23/2016

## 2016-02-24 ENCOUNTER — Encounter (HOSPITAL_BASED_OUTPATIENT_CLINIC_OR_DEPARTMENT_OTHER): Payer: Self-pay

## 2016-02-24 ENCOUNTER — Telehealth: Payer: Self-pay | Admitting: Physician Assistant

## 2016-02-24 ENCOUNTER — Emergency Department (HOSPITAL_BASED_OUTPATIENT_CLINIC_OR_DEPARTMENT_OTHER)
Admission: EM | Admit: 2016-02-24 | Discharge: 2016-02-24 | Disposition: A | Discharge status: Home / Self Care | Payer: Medicare Other | Attending: Emergency Medicine | Admitting: Emergency Medicine

## 2016-02-24 DIAGNOSIS — J441 Chronic obstructive pulmonary disease with (acute) exacerbation: Secondary | ICD-10-CM | POA: Diagnosis not present

## 2016-02-24 DIAGNOSIS — I10 Essential (primary) hypertension: Secondary | ICD-10-CM | POA: Insufficient documentation

## 2016-02-24 DIAGNOSIS — Z8546 Personal history of malignant neoplasm of prostate: Secondary | ICD-10-CM | POA: Insufficient documentation

## 2016-02-24 DIAGNOSIS — F329 Major depressive disorder, single episode, unspecified: Secondary | ICD-10-CM | POA: Insufficient documentation

## 2016-02-24 DIAGNOSIS — M1990 Unspecified osteoarthritis, unspecified site: Secondary | ICD-10-CM | POA: Insufficient documentation

## 2016-02-24 DIAGNOSIS — Z85820 Personal history of malignant melanoma of skin: Secondary | ICD-10-CM | POA: Insufficient documentation

## 2016-02-24 DIAGNOSIS — T7840XA Allergy, unspecified, initial encounter: Secondary | ICD-10-CM | POA: Diagnosis not present

## 2016-02-24 DIAGNOSIS — Z87891 Personal history of nicotine dependence: Secondary | ICD-10-CM | POA: Diagnosis not present

## 2016-02-24 DIAGNOSIS — E119 Type 2 diabetes mellitus without complications: Secondary | ICD-10-CM | POA: Diagnosis not present

## 2016-02-24 DIAGNOSIS — F209 Schizophrenia, unspecified: Secondary | ICD-10-CM | POA: Insufficient documentation

## 2016-02-24 DIAGNOSIS — Z79899 Other long term (current) drug therapy: Secondary | ICD-10-CM | POA: Diagnosis not present

## 2016-02-24 MED ORDER — FAMOTIDINE 20 MG PO TABS
20.0000 mg | ORAL_TABLET | Freq: Once | ORAL | Status: AC
  Administered 2016-02-24: 20 mg via ORAL
  Filled 2016-02-24: qty 1

## 2016-02-24 MED ORDER — PREDNISONE 50 MG PO TABS
60.0000 mg | ORAL_TABLET | Freq: Once | ORAL | Status: AC
  Administered 2016-02-24: 60 mg via ORAL
  Filled 2016-02-24: qty 1

## 2016-02-24 MED ORDER — HYDROXYZINE HCL 25 MG PO TABS
25.0000 mg | ORAL_TABLET | Freq: Once | ORAL | Status: AC
  Administered 2016-02-24: 25 mg via ORAL
  Filled 2016-02-24: qty 1

## 2016-02-24 MED ORDER — HYDROXYZINE HCL 25 MG PO TABS: 25.0000 mg | ORAL_TABLET | Freq: Three times a day (TID) | ORAL | Status: DC | PRN

## 2016-02-24 MED ORDER — EPINEPHRINE 0.3 MG/0.3ML IJ SOAJ: 0.3000 mg | Freq: Once | INTRAMUSCULAR | Status: DC

## 2016-02-24 MED ORDER — ALBUTEROL SULFATE (2.5 MG/3ML) 0.083% IN NEBU
5.0000 mg | INHALATION_SOLUTION | Freq: Once | RESPIRATORY_TRACT | Status: AC
  Administered 2016-02-24: 5 mg via RESPIRATORY_TRACT
  Filled 2016-02-24: qty 6

## 2016-02-24 NOTE — Discharge Instructions (Signed)

## 2016-02-24 NOTE — ED Notes (Signed)
C/o itching yesterday-SOB started last night-pt having to use home O2 and neb tx through today-pt has been taking benadryl-pt NAD-steady gait

## 2016-02-24 NOTE — ED Provider Notes (Signed)
CSN: [id number]     Arrival date & time 02/24/16  1115 History   First MD Initiated Contact with Patient 02/24/16 1137     Chief Complaint  Patient presents with  . Allergic Reaction     (Consider location/radiation/quality/duration/timing/severity/associated sxs/prior Treatment) HPI Comments: Patient presents with itching. He is a 65-year-old male with a history of hypertension, gastroesophageal reflux disease and COPD. He states that last night he noted some itching and he woke up this morning with more severe itching. He states it's from his scalp all the way down to his feet. He does note worsening shortness of breath which she attributes to his COPD. He's had some increased wheezing. He's use his nebulizer treatments at home with improvement of symptoms. Right now he states this breathing is at baseline. He denies any swelling of his lips or tongue. He denies any nausea or vomiting. He denies any new exposures. He denies any visible rash although he states his face was red when he woke up this morning. He had a similar episode of itching about 6 months ago which he says resolved on its own after about a day. He did take a benadryl this morning with no improvement in symptoms.  Patient is a 65 y.o. male presenting with allergic reaction.  Allergic Reaction Presenting symptoms: rash (redness to face, itching)     Past Medical History  Diagnosis Date  . Hypertension   . GERD (gastroesophageal reflux disease)   . History of TB (tuberculosis)     1984--  hospitalized for 4 month treatment  . Post-polio syndrome     polio at age 16--PT WAS IN IRON LUNG; PT WAS IN W/C UNTIL AGE 68; STILL HAS WEAKNESS RIGHT SIDE  . History of chronic bronchitis   . History of rheumatic fever   . Bladder outlet obstruction   . Anxiety disorder   . History of urinary retention   . Nocturnal oxygen desaturation     USES O2 NIGHTLY  . Complication of anesthesia     DIFFICULT WAKING   . Diabetes mellitus  without complication ([place])     BODERLINE - DIET CONTROL  . Anxiety   . Dysrhythmia     PVC'S  . Arthritis     BILATERAL SHOULDERS, ELBOWS AND HANDS AND LEFT HIP AND KNEES--HX CORTISONE SHOTS IN SHOULDERS, ELBOWS, HIP AND KNEES  . History of oxygen administration     oxygen use 2 l/m nasally at bedtime and exertional occasions  . COPD, frequent exacerbations ([place])     pulmologist-  dr Joya Gaskins--  [place]a Rm Stage C.04-25-15 recent COPD exacerbation-much improved now, after tx. in ER [facility].  Marland Kitchen PONV (postoperative nausea and vomiting)   . Family history of adverse reaction to anesthesia     father would wake up with agitation   . Shortness of breath dyspnea     RIGHT HEMIDIAPHRAGM ELEVATION - POST POLIO SYNDROME  . Pneumonia     hx of   . Hx of multiple concussions     x 2 per patient   . Depression   . Schizophrenia ([place])   . Prostate cancer ([place])   . Prostate cancer ([place])   . Melanoma [facility])    Past Surgical History  Procedure Laterality Date  . Other surgical history       Muscle & bone Graft/Polio  . Cystoscopy w/ cystogram/  transrectal ultrasound prostate bx  03-22-2009  . Cardiovascular stress test  06-08-2014  dr Mare Ferrari    normal  lexiscan study/  no ischemia/  not gated due to PAC's  . Ureterosopy stone extraction  2000  . Nasal septum surgery  2000  . Shoulder arthroscopy with open rotator cuff repair Bilateral 2013  &  1999    removal spurs and bursectomy  . Laparoscopic cholecystectomy  2013  . Prostate biopsy N/A 09/28/2014    Procedure: PROSTATE ULTRASOUND/BIOPSY;  Surgeon: [name], MD;  Location: [facility];  Service: Urology;  Laterality: N/A;  . Transurethral resection of prostate N/A 09/28/2014    Procedure: TRANSURETHRAL RESECTION OF THE PROSTATE (TURP);  Surgeon: [name], MD;  Location: [facility];  Service: Urology;  Laterality: N/A;  . Esophagogastroduodenoscopy N/A 03/22/2015    Procedure: ESOPHAGOGASTRODUODENOSCOPY (EGD) with dilation;  Surgeon: [name], MD;  Location: [facility];  Service: Endoscopy;  Laterality: N/A;  . Savory dilation N/A 03/22/2015    Procedure: SAVORY DILATION;  Surgeon: [name], MD;  Location: [facility];  Service: Endoscopy;  Laterality: N/A;  . Colonoscopy N/A 05/03/2015    Procedure: COLONOSCOPY;  Surgeon: [name], MD;  Location: [facility];  Service: Endoscopy;  Laterality: N/A;  . Polio surgeries       14 polio surgeries   . Left elbow surgery       due to fracture   . Prostate biopsy N/A 10/25/2015    Procedure: PROSTATE BIOPSY AND ULTRASOUND;  Surgeon: [name], MD;  Location: [facility];  Service: Urology;  Laterality: N/A;  . Transurethral incision of bladder neck N/A 10/25/2015    Procedure:  TRANSURETHRAL INCISION OF BLADDER NECK;  Surgeon: [name], MD;  Location: [facility];  Service: Urology;  Laterality: N/A;  . Cystoscopy N/A 10/25/2015    Procedure: CYSTOSCOPY;  Surgeon: [name], MD;  Location: [facility];  Service: Urology;  Laterality: N/A;   Family History  Problem Relation Age of Onset  . Alzheimer's disease Father 4    Deceased  . Stomach cancer Father   . Heart attack Father   . Heart disease Father   . Skin cancer Mother     Facial-Living  . Alcohol abuse Sister     x2  . Mental illness Sister     x2  . Diabetes Maternal Aunt     x2  . Thyroid disease Maternal Aunt     x4  . Diabetes Maternal Uncle   . Tuberculosis Paternal Grandfather   . Tuberculosis Paternal Grandmother   . Alzheimer's disease Paternal Aunt   . Alzheimer's disease Paternal Uncle   . Colon cancer Neg Hx   . Colon polyps Neg Hx   . Crohn's disease Neg Hx   . Ulcerative colitis Neg Hx    Social History  Substance Use Topics  . Smoking status: Former Smoker -- 21 years    Quit date: 08/25/2013  . Smokeless tobacco: Former Systems developer  . Alcohol Use: Yes     Comment: daily    Review of Systems  Constitutional: Negative for fever, chills, diaphoresis and fatigue.  HENT: Negative for congestion,  rhinorrhea and sneezing.   Eyes: Negative.   Respiratory: Negative for cough, chest tightness and shortness of breath.   Cardiovascular: Negative for chest pain and leg swelling.  Gastrointestinal: Negative for nausea, vomiting, abdominal pain, diarrhea and blood in stool.  Genitourinary: Negative for frequency, hematuria, flank pain and difficulty urinating.  Musculoskeletal: Negative for back pain and arthralgias.  Skin: Positive for rash (redness to face, itching).  Neurological: Negative for  dizziness, speech difficulty, weakness, numbness and headaches.      Allergies  Ivp dye; Levaquin; Penicillins; Aspirin; Nsaids; Tolmetin; Buprenorphine hcl; Morphine and related; and Oxycodone  Home Medications   Prior to Admission medications   Medication Sig Start Date End Date Taking? Authorizing Provider  albuterol (PROVENTIL HFA;VENTOLIN HFA) 108 (90 Base) MCG/ACT inhaler Inhale 2 puffs into the lungs every 6 (six) hours as needed for wheezing or shortness of breath. 10/20/15   Brunetta Jeans, PA-C  ALPRAZolam [name]) 1 MG tablet Take 0.5 tablets (0.5 mg total) by mouth 3 (three) times daily as needed for anxiety. 02/21/16   Brunetta Jeans, PA-C  amLODipine (NORVASC) 10 MG tablet Take 1 tablet (10 mg total) by mouth daily. 10/19/15   Brunetta Jeans, PA-C  dexlansoprazole (DEXILANT) 60 MG capsule Take 1 capsule (60 mg total) by mouth daily. 12/21/15   Brunetta Jeans, PA-C  diphenhydrAMINE (SOMINEX) 25 MG tablet Take 25 mg by mouth daily as needed for itching or sleep. Reported on 01/25/2016    Historical Provider, MD  fluticasone (FLONASE) 50 MCG/ACT nasal spray Place 2 sprays into both nostrils daily. Patient taking differently: Place 2 sprays into both nostrils daily as needed for allergies.  08/03/15   Brunetta Jeans, PA-C  Fluticasone Furoate-Vilanterol (BREO ELLIPTA) 100-25 MCG/INH AEPB INHALE 1 PUFF INTO THE LUNGS EVERY MORNING. DAILY 10/25/15   Brunetta Jeans, PA-C  gabapentin  (NEURONTIN) 300 MG capsule Take 1 capsule (300 mg total) by mouth 3 (three) times daily. One tab PO daily for a week, then twice daily for a week, then three times daily. Patient taking differently: Take 300 mg by mouth 3 (three) times daily as needed.  02/21/16   Brunetta Jeans, PA-C  guaiFENesin (MUCINEX) 600 MG 12 hr tablet Take 1 tablet (600 mg total) by mouth 2 (two) times daily. 11/23/15   Geradine Girt, DO  HYDROcodone-acetaminophen (NORCO/VICODIN) 5-325 MG tablet Take 1-2 tablets by mouth every 6 (six) hours as needed for moderate pain. 02/21/16   Brunetta Jeans, PA-C  hydrOXYzine (ATARAX/VISTARIL) 25 MG tablet Take 1 tablet (25 mg total) by mouth every 8 (eight) hours as needed for itching. 02/24/16   Malvin Johns, MD  ipratropium-albuterol (DUONEB) 0.5-2.5 (3) MG/3ML SOLN Take 3 mLs by nebulization every 4 (four) hours as needed (shortness of breath and wheezing).    Historical Provider, MD  loratadine (CLARITIN) 10 MG tablet Take 10 mg by mouth daily as needed for allergies. Reported on 01/25/2016 08/01/15   Historical Provider, MD  meclizine (ANTIVERT) 25 MG tablet Take 1 tablet by mouth daily as needed. Reported on 01/25/2016 12/14/15   Historical Provider, MD  metoprolol succinate (TOPROL-XL) 25 MG 24 hr tablet Take 1 tablet (25 mg total) by mouth daily. 10/20/15   Brunetta Jeans, PA-C  montelukast (SINGULAIR) 10 MG tablet TAKE 1 TABLET (10 MG TOTAL) BY MOUTH AT BEDTIME. 10/20/15   Brunetta Jeans, PA-C  nitroGLYCERIN (NITROSTAT) 0.4 MG SL tablet Place 1 tablet (0.4 mg total) under the tongue every 5 (five) minutes as needed for chest pain. 06/21/15   Brunetta Jeans, PA-C  ondansetron (ZOFRAN) 8 MG tablet Take 1 tablet (8 mg total) by mouth once. 02/21/16   Brunetta Jeans, PA-C  OXYGEN Inhale 2 L into the lungs at bedtime. Reported on 11/25/2015    Historical Provider, MD  predniSONE (DELTASONE) 10 MG tablet Take 10 mg by mouth 4 (four) times daily. Pt has 20 mg  but does not take    Historical  Provider, MD  PRESCRIPTION MEDICATION CPAP at night 4l/pm    Historical Provider, MD  tiotropium (SPIRIVA) 18 MCG inhalation capsule Place 1 capsule (18 mcg total) into inhaler and inhale daily. IN AM Patient taking differently: Place 18 mcg into inhaler and inhale at bedtime.  05/05/15   Brunetta Jeans, PA-C  Vitamin D, Ergocalciferol, (DRISDOL) 50000 units CAPS capsule Take 1 capsule (50,000 Units total) by mouth every 7 (seven) days. Patient not taking: Reported on 02/21/2016 12/13/15   Brunetta Jeans, PA-C   BP 120/65 mmHg  Pulse 65  Temp(Src) 97.9 F (36.6 C) (Oral)  Resp 20  Ht 6' (1.829 m)  Wt 225 lb (102.059 kg)  BMI 30.51 kg/m2  SpO2 96% Physical Exam  Constitutional: He is oriented to person, place, and time. He appears well-developed and well-nourished.  HENT:  Head: Normocephalic and atraumatic.  No angioedema  Eyes: Pupils are equal, round, and reactive to light.  Neck: Normal range of motion. Neck supple.  Cardiovascular: Normal rate, regular rhythm and normal heart sounds.   Pulmonary/Chest: Effort normal and breath sounds normal. No respiratory distress. He has no wheezes. He has no rales. He exhibits no tenderness.  Abdominal: Soft. Bowel sounds are normal. There is no tenderness. There is no rebound and no guarding.  Musculoskeletal: Normal range of motion. He exhibits no edema.  Lymphadenopathy:    He has no cervical adenopathy.  Neurological: He is alert and oriented to person, place, and time.  Skin: Skin is warm and dry. No rash noted.  Psychiatric: He has a normal mood and affect.    ED Course  Procedures (including critical care time) Labs Review Labs Reviewed - No data to display  Imaging Review No results found. I have personally reviewed and evaluated these images and lab results as part of my medical decision-making.   EKG Interpretation None      MDM   Final diagnoses:  Allergic reaction, initial encounter    Patient presents with  itching. There is no visible rash. No angioedema. No sensation of swelling in his throat. No shortness of breath although at one point he did feel tight in his breathing and got a nebulizer treatment here. He was monitored for a few hours with no return in symptoms. He is given Atarax and a dose of prednisone in the ED. He's currently on a prednisone taper for what he says his arthritis. He's been tapering over the last month from 80 mg. He's currently on 10 mg. I advised him to take 20 mg tomorrow and then continue on with a normal taper. He was given a prescription for Atarax for the itching. He was also given a prescription for an EpiPen should he have worsening symptoms. He was advised to return here if he has any worsening symptoms otherwise follow-up with his PCP for recheck.    Malvin Johns, MD 02/24/16 [phone]

## 2016-02-24 NOTE — Telephone Encounter (Signed)
[facility] Day - Client TELEPHONE ADVICE RECORD [facility] Patient Name: [name] DOB: [birth year] Initial Comment Caller's face is itchy and has SOB and seems like an allergic reaction Nurse Assessment Nurse: Markus Daft, RN, [place] Date/Time (Eastern Time): 02/24/2016 9:34:21 AM Confirm and document reason for call. If symptomatic, describe symptoms. You must click the next button to save text entered. ---Caller's face/neck is itchy/burning discomfort, and has SOB with facial swelling and seems like an allergic reaction. Used his Albuterol nebulizer this AM. No CP. Started yesterday with headache and went out to dinner buffet - chicken, pork, veggies - and then an hr later he had s/s. He used Benadryl. - No h/o food allergies. Has the patient traveled out of the country within the last 30 days? ---Not Applicable Does the patient have any new or worsening symptoms? ---Yes Will a triage be completed? ---Yes Related visit to physician within the last 2 weeks? ---No Does the PT have any chronic conditions? (i.e. diabetes, asthma, etc.) ---Yes List chronic conditions. ---HTN, Prostate CA - biopsy every year for last 3, COPD, Sleep apnea - has oxygen and CPAP - used oxygen 2 L/[place] Is this a behavioral health or substance abuse call? ---No Guidelines Guideline Title Affirmed Question Affirmed Notes Face Swelling Difficulty breathing or wheezing Final Disposition User Call EMS 911 Now Markus Daft, [place], [place] Disagree/Comply: Disagree Disagree/Comply Reason: Disagree with instructions 02/24/2016 9:43:38 AM --911 Outcome Documentation  Reason: Caller states that he has an Epi pen but doesn't want to use it, and plans to have someone drive him to the ER right away instead.

## 2016-02-24 NOTE — Telephone Encounter (Signed)
Reason for call: pt called in stating he was not feeling well last night. He is now having some type of allergic reaction. He is itching all over, his face feels burned, and he is having shortness of breath. Transferred to [facility] with [facility].

## 2016-03-01 ENCOUNTER — Ambulatory Visit (INDEPENDENT_AMBULATORY_CARE_PROVIDER_SITE_OTHER): Payer: Medicare Other | Admitting: Clinical

## 2016-03-01 ENCOUNTER — Encounter (HOSPITAL_COMMUNITY): Payer: Self-pay | Admitting: Clinical

## 2016-03-01 DIAGNOSIS — F3162 Bipolar disorder, current episode mixed, moderate: Secondary | ICD-10-CM | POA: Diagnosis not present

## 2016-03-01 DIAGNOSIS — F603 Borderline personality disorder: Secondary | ICD-10-CM

## 2016-03-01 DIAGNOSIS — F1020 Alcohol dependence, uncomplicated: Secondary | ICD-10-CM

## 2016-03-01 NOTE — Progress Notes (Signed)
   THERAPIST PROGRESS NOTE  Session Time: 9:02 - 9:58  Participation Level: Active  Behavioral Response: CasualAlertIrritable  Type of Therapy: Individual Therapy  Treatment Goals addressed: improve psychiatric symptoms, healthy sleep hygiene skills, improve unhelpful thought patterns, Emotional regulation skills (moderate moods, stress management, anger management), decrease perfection, healthy coping skills, harm reduction skills  Interventions: CBT and Motivational Interviewing, Grounding and Mindfulness Techniques, psychoeducation  Summary: [name] is a 65  y.o. male who presents with Bipolar 1 disorder, and Alcohol use disorder, moderate, dependence, and explosive personality disorder  Suicidal/Homicidal: No -without intent/plan  Therapist Response:  Kin met with clinician for an individual session. [name] discussed his psychiatric symptoms  and his current life events. [name] shared he has not practiced his mindfulness or grounding. Client and clinician discussed the importance of doing his homework so that he can make progress outside of the therapy sessions. He shared that his drinking increased last week. He shared his thoughts and insights about the increase. He shared that he is considering going to detox in July, maybe. Clinician asked open ended questions and [name] shared his thoughts about his drinking and why July would be best for him. He shared that he is having neck surgery the first week of June. Client and clinician discussed some harm reduction skills he could use to help him cut back on his drinking in the mean time. [name] shared he expressed less anger this week. He shared that he did have a few angry outburst ( non- physical) . He shared about his successes in controlling his behavior and where he could use improvement. Clinician asked open ended questions and [name] listed the pros and cons of his angry outburst. [name] shared that he would like a more  intimate relationship with his wife, but he was unable to recognize that how he expresses his anger may be getting in the way. Clinician asked open ended questions about how he feels internally when someone expresses their anger at him. He shared his thoughts and insights. He shared that while in the moment expressing anger the way he does feels good but that it is not giving him the results he desires. Client and clinician discussed some stress management and anger management techniques. Client and clinician discussed assertiveness.    Plan: Return again in 1 weeks.  Diagnosis: Axis I: Bipolar 1 disorder, and Alcohol use disorder, moderate, dependence, and explosive personality disorder    [name] A, LCSW 03/01/2016

## 2016-03-06 ENCOUNTER — Telehealth (HOSPITAL_COMMUNITY): Payer: Self-pay

## 2016-03-08 ENCOUNTER — Ambulatory Visit (HOSPITAL_COMMUNITY): Payer: Self-pay | Admitting: Clinical

## 2016-03-15 ENCOUNTER — Ambulatory Visit (HOSPITAL_COMMUNITY): Payer: Self-pay | Admitting: Clinical

## 2016-03-15 ENCOUNTER — Telehealth: Payer: Self-pay | Admitting: Physician Assistant

## 2016-03-15 NOTE — Telephone Encounter (Signed)
Caller name: Self   Can be reached: [phone]   Pharmacy:  [facility] [id number] - [place] [phone] (Phone) [phone] (Fax)         Reason for call: Request refill on baclofen (LIORESAL) tablet 10 mg

## 2016-03-16 ENCOUNTER — Other Ambulatory Visit: Payer: Self-pay | Admitting: Physician Assistant

## 2016-03-16 ENCOUNTER — Telehealth (HOSPITAL_COMMUNITY): Payer: Self-pay

## 2016-03-16 MED ORDER — BACLOFEN 10 MG PO TABS: 10.0000 mg | ORAL_TABLET | Freq: Two times a day (BID) | ORAL | Status: DC

## 2016-03-16 NOTE — Telephone Encounter (Signed)
Requesting Baclofen 10mg -No longer on the pt's medication list (was taking off last year) Last OV:02/21/16 Please advise.//AB/CMA

## 2016-03-16 NOTE — Telephone Encounter (Signed)
Will allow one-time Rx for spasms. Take only as directed. No alcohol with medication. No driving or operating equipment while taking medication. I have sent in script.

## 2016-03-16 NOTE — Telephone Encounter (Signed)
Patient checking on the status

## 2016-03-16 NOTE — Telephone Encounter (Signed)
Called and [facility] @ 8:45am @ [phone]) informing the pt of the note below regarding medication request.  Asked the pt to RTC if he has any questions or concerns.//AB/CMA

## 2016-03-20 DIAGNOSIS — D485 Neoplasm of uncertain behavior of skin: Secondary | ICD-10-CM | POA: Diagnosis not present

## 2016-03-20 DIAGNOSIS — L820 Inflamed seborrheic keratosis: Secondary | ICD-10-CM | POA: Diagnosis not present

## 2016-03-20 DIAGNOSIS — D225 Melanocytic nevi of trunk: Secondary | ICD-10-CM | POA: Diagnosis not present

## 2016-03-20 DIAGNOSIS — Z85828 Personal history of other malignant neoplasm of skin: Secondary | ICD-10-CM | POA: Diagnosis not present

## 2016-03-20 DIAGNOSIS — D1801 Hemangioma of skin and subcutaneous tissue: Secondary | ICD-10-CM | POA: Diagnosis not present

## 2016-03-20 DIAGNOSIS — L821 Other seborrheic keratosis: Secondary | ICD-10-CM | POA: Diagnosis not present

## 2016-03-20 DIAGNOSIS — C44519 Basal cell carcinoma of skin of other part of trunk: Secondary | ICD-10-CM | POA: Diagnosis not present

## 2016-04-02 ENCOUNTER — Other Ambulatory Visit: Payer: Self-pay

## 2016-04-02 MED ORDER — MONTELUKAST SODIUM 10 MG PO TABS: ORAL_TABLET | ORAL | Status: DC

## 2016-04-02 MED ORDER — METOPROLOL SUCCINATE ER 25 MG PO TB24: 25.0000 mg | ORAL_TABLET | Freq: Every day | ORAL | Status: DC

## 2016-04-02 MED ORDER — GABAPENTIN 300 MG PO CAPS: 300.0000 mg | ORAL_CAPSULE | Freq: Three times a day (TID) | ORAL | Status: DC | PRN

## 2016-04-03 ENCOUNTER — Other Ambulatory Visit: Payer: Self-pay

## 2016-04-03 MED ORDER — BACLOFEN 10 MG PO TABS: 10.0000 mg | ORAL_TABLET | Freq: Two times a day (BID) | ORAL | Status: DC

## 2016-04-03 MED ORDER — AMLODIPINE BESYLATE 10 MG PO TABS: 10.0000 mg | ORAL_TABLET | Freq: Every day | ORAL | Status: DC

## 2016-04-10 ENCOUNTER — Other Ambulatory Visit: Payer: Self-pay | Admitting: *Deleted

## 2016-04-10 DIAGNOSIS — F419 Anxiety disorder, unspecified: Secondary | ICD-10-CM

## 2016-04-10 NOTE — Telephone Encounter (Signed)
Faxed refill request received from [facility] for Alprazolam 1mg  Last filled by MD on 02/21/16, #90x0 Last AEX - 02/21/16 Next AEX - 3-Mths. Rx pending signature/SLS 06/27

## 2016-04-11 ENCOUNTER — Encounter: Payer: Self-pay | Admitting: Physician Assistant

## 2016-04-11 ENCOUNTER — Telehealth: Payer: Self-pay | Admitting: Physician Assistant

## 2016-04-11 DIAGNOSIS — T7840XA Allergy, unspecified, initial encounter: Secondary | ICD-10-CM

## 2016-04-11 MED ORDER — ALPRAZOLAM 1 MG PO TABS
0.5000 mg | ORAL_TABLET | Freq: Three times a day (TID) | ORAL | Status: DC | PRN
Start: 2016-04-11 — End: 2016-10-29

## 2016-04-11 NOTE — Telephone Encounter (Signed)
Referral to Allergy placed; pt is already established with [facility] and just needs to call for appointment; [facility] for patient to call back if he would like to see a different GI office/SLS 06/28

## 2016-04-11 NOTE — Telephone Encounter (Signed)
See Rx 

## 2016-04-11 NOTE — Telephone Encounter (Signed)
Rx faxed to pharmacy/SlS 06/28

## 2016-04-11 NOTE — Telephone Encounter (Signed)
°  Relation to [id number] Call back number:[phone]   Reason for call:  Patient requesting a referral to allergist and gastro due to allergies and being constipated. Advised patient PCP is out of the office until 04/18/16. Patient would like to know if covering doctor can place the referral. In addition patient states he has been seen already for issues. Please advise

## 2016-04-13 ENCOUNTER — Other Ambulatory Visit: Payer: Self-pay | Admitting: *Deleted

## 2016-04-13 NOTE — Telephone Encounter (Signed)
Received fax request for Tramadol; Denied request. Medication D/C by provider at 02/21/16 OV, as patient reported ineffective/SLS 06/30

## 2016-04-18 DIAGNOSIS — R262 Difficulty in walking, not elsewhere classified: Secondary | ICD-10-CM | POA: Diagnosis not present

## 2016-04-18 DIAGNOSIS — M25562 Pain in left knee: Secondary | ICD-10-CM | POA: Diagnosis not present

## 2016-04-18 DIAGNOSIS — M170 Bilateral primary osteoarthritis of knee: Secondary | ICD-10-CM | POA: Diagnosis not present

## 2016-04-18 DIAGNOSIS — M1712 Unilateral primary osteoarthritis, left knee: Secondary | ICD-10-CM | POA: Diagnosis not present

## 2016-04-23 DIAGNOSIS — M25562 Pain in left knee: Secondary | ICD-10-CM | POA: Diagnosis not present

## 2016-04-23 DIAGNOSIS — M25561 Pain in right knee: Secondary | ICD-10-CM | POA: Diagnosis not present

## 2016-04-23 DIAGNOSIS — M1712 Unilateral primary osteoarthritis, left knee: Secondary | ICD-10-CM | POA: Diagnosis not present

## 2016-04-23 DIAGNOSIS — R2689 Other abnormalities of gait and mobility: Secondary | ICD-10-CM | POA: Diagnosis not present

## 2016-04-23 DIAGNOSIS — M170 Bilateral primary osteoarthritis of knee: Secondary | ICD-10-CM | POA: Diagnosis not present

## 2016-04-25 ENCOUNTER — Telehealth: Payer: Self-pay | Admitting: Physician Assistant

## 2016-04-25 NOTE — Telephone Encounter (Signed)
Fine with me. I wish him the best!

## 2016-04-25 NOTE — Telephone Encounter (Signed)
Pt called in to inform PCP that he has chosen to switch providers to a different location. He says that the new location is closer for him and also he would like to have a MD.

## 2016-04-30 ENCOUNTER — Encounter: Payer: Self-pay | Admitting: Family Medicine

## 2016-04-30 ENCOUNTER — Ambulatory Visit (INDEPENDENT_AMBULATORY_CARE_PROVIDER_SITE_OTHER): Payer: Medicare Other | Admitting: Family Medicine

## 2016-04-30 VITALS — BP 120/75 | HR 73 | Ht 71.0 in | Wt 222.3 lb

## 2016-04-30 DIAGNOSIS — Z789 Other specified health status: Secondary | ICD-10-CM | POA: Insufficient documentation

## 2016-04-30 DIAGNOSIS — I10 Essential (primary) hypertension: Secondary | ICD-10-CM

## 2016-04-30 DIAGNOSIS — Z87820 Personal history of traumatic brain injury: Secondary | ICD-10-CM

## 2016-04-30 DIAGNOSIS — J449 Chronic obstructive pulmonary disease, unspecified: Secondary | ICD-10-CM

## 2016-04-30 DIAGNOSIS — R413 Other amnesia: Secondary | ICD-10-CM

## 2016-04-30 DIAGNOSIS — M2560 Stiffness of unspecified joint, not elsewhere classified: Secondary | ICD-10-CM

## 2016-04-30 DIAGNOSIS — Z113 Encounter for screening for infections with a predominantly sexual mode of transmission: Secondary | ICD-10-CM

## 2016-04-30 DIAGNOSIS — G4733 Obstructive sleep apnea (adult) (pediatric): Secondary | ICD-10-CM

## 2016-04-30 DIAGNOSIS — G14 Postpolio syndrome: Secondary | ICD-10-CM

## 2016-04-30 DIAGNOSIS — E559 Vitamin D deficiency, unspecified: Secondary | ICD-10-CM | POA: Insufficient documentation

## 2016-04-30 DIAGNOSIS — G8929 Other chronic pain: Secondary | ICD-10-CM

## 2016-04-30 DIAGNOSIS — D849 Immunodeficiency, unspecified: Secondary | ICD-10-CM | POA: Insufficient documentation

## 2016-04-30 DIAGNOSIS — I499 Cardiac arrhythmia, unspecified: Secondary | ICD-10-CM | POA: Insufficient documentation

## 2016-04-30 DIAGNOSIS — C44519 Basal cell carcinoma of skin of other part of trunk: Secondary | ICD-10-CM

## 2016-04-30 DIAGNOSIS — D899 Disorder involving the immune mechanism, unspecified: Secondary | ICD-10-CM

## 2016-04-30 DIAGNOSIS — E669 Obesity, unspecified: Secondary | ICD-10-CM | POA: Diagnosis not present

## 2016-04-30 DIAGNOSIS — E119 Type 2 diabetes mellitus without complications: Secondary | ICD-10-CM | POA: Diagnosis not present

## 2016-04-30 DIAGNOSIS — F1020 Alcohol dependence, uncomplicated: Secondary | ICD-10-CM

## 2016-04-30 DIAGNOSIS — J3089 Other allergic rhinitis: Secondary | ICD-10-CM | POA: Insufficient documentation

## 2016-04-30 DIAGNOSIS — F1090 Alcohol use, unspecified, uncomplicated: Secondary | ICD-10-CM

## 2016-04-30 DIAGNOSIS — Z0000 Encounter for general adult medical examination without abnormal findings: Secondary | ICD-10-CM | POA: Insufficient documentation

## 2016-04-30 DIAGNOSIS — F209 Schizophrenia, unspecified: Secondary | ICD-10-CM | POA: Insufficient documentation

## 2016-04-30 HISTORY — DX: Alcohol use, unspecified, uncomplicated: F10.90

## 2016-04-30 HISTORY — DX: Basal cell carcinoma of skin of other part of trunk: C44.519

## 2016-04-30 HISTORY — DX: Other specified health status: Z78.9

## 2016-04-30 LAB — POCT GLYCOSYLATED HEMOGLOBIN (HGB A1C): Hemoglobin A1C: 5.9

## 2016-04-30 NOTE — Progress Notes (Signed)
[name], D.O. Primary care at [place]:    Chief Complaint  Patient presents with  . Establish Care   New pt, here to establish care.   HPI: [name] is a pleasant 65 y.o. male who presents to [place] at [facility] today   HTN:   12 yrs. Doesn't check at home. Always well controlled.  Asx.   DM:  Last A1c 6.8 in Feb 11/22/15.   Pt not aware he had h/o DM or aware of what this dx is.  Can't recall when eye exam was  Cervical disc disease:  sees ortho- currently under their care  Flexogenics injections into knees curently. Ordered him a brace as well.  Pt stopped going to ortho for this     H/o Bulbar polio-   Entire R side weakness at 65 yo.     Alcoholism/ heavy use in past:  Drinks about 6-12 pack /week now- which "is a lot less than usual."  Quit smoking 3 yrs ago.  Bad COPD. Sees Pulm regularly- for OSA as well. Which he can't stand mask.  Pt worried about getting memory issues.  Father and almost everyone on that side of the family has alzheimers,  Mothers side- strong DM hx.       Past Medical History  Diagnosis Date  . Hypertension   . GERD (gastroesophageal reflux disease)   . History of TB (tuberculosis)     1984--  hospitalized for 4 month treatment  . Post-polio syndrome     polio at age 70--PT WAS IN IRON LUNG; PT WAS IN W/C UNTIL AGE 67; STILL HAS WEAKNESS RIGHT SIDE  . History of chronic bronchitis   . History of rheumatic fever   . Bladder outlet obstruction   . Anxiety disorder   . History of urinary retention   . Nocturnal oxygen desaturation     USES O2 NIGHTLY  . Complication of anesthesia     DIFFICULT WAKING   . Diabetes mellitus without complication ([place])     BODERLINE - DIET CONTROL  . Anxiety   . Dysrhythmia     PVC'S  . Arthritis     BILATERAL SHOULDERS, ELBOWS AND HANDS AND LEFT HIP AND KNEES--HX CORTISONE SHOTS IN SHOULDERS, ELBOWS, HIP AND KNEES  . History of oxygen  administration     oxygen use 2 l/m nasally at bedtime and exertional occasions  . COPD, frequent exacerbations ([place])     pulmologist-  dr Joya Gaskins--  [place] Rm Stage C.04-25-15 recent COPD exacerbation-much improved now, after tx. in ER [facility].  Marland Kitchen PONV (postoperative nausea and vomiting)   . Family history of adverse reaction to anesthesia     father would wake up with agitation   . Shortness of breath dyspnea     RIGHT HEMIDIAPHRAGM ELEVATION - POST POLIO SYNDROME  . Pneumonia     hx of   . Hx of multiple concussions     x 2 per patient   . Depression   . Schizophrenia ([place])   . Prostate cancer ([place])   . Prostate cancer ([place])   . Melanoma [facility])       Past Surgical History  Procedure Laterality Date  . Other surgical history       Muscle & bone Graft/Polio  . Cystoscopy w/ cystogram/  transrectal ultrasound prostate bx  03-22-2009  . Cardiovascular stress test  06-08-2014  dr Mare Ferrari  normal lexiscan study/  no ischemia/  not gated due to PAC's  . Ureterosopy stone extraction  2000  . Nasal septum surgery  2000  . Shoulder arthroscopy with open rotator cuff repair Bilateral 2013  &  1999    removal spurs and bursectomy  . Laparoscopic cholecystectomy  2013  . Prostate biopsy N/A 09/28/2014    Procedure: PROSTATE ULTRASOUND/BIOPSY;  Surgeon: [name], MD;  Location: [facility];  Service: Urology;  Laterality: N/A;  . Transurethral resection of prostate N/A 09/28/2014    Procedure: TRANSURETHRAL RESECTION OF THE PROSTATE (TURP);  Surgeon: [name], MD;  Location: [facility];  Service: Urology;  Laterality: N/A;  . Esophagogastroduodenoscopy N/A 03/22/2015    Procedure: ESOPHAGOGASTRODUODENOSCOPY (EGD) with dilation;  Surgeon: [name], MD;  Location: [facility];  Service: Endoscopy;  Laterality: N/A;  . Savory dilation N/A 03/22/2015    Procedure: SAVORY DILATION;  Surgeon: [name], MD;  Location: [facility];  Service: Endoscopy;  Laterality: N/A;  . Colonoscopy  N/A 05/03/2015    Procedure: COLONOSCOPY;  Surgeon: [name], MD;  Location: [facility];  Service: Endoscopy;  Laterality: N/A;  . Polio surgeries       14 polio surgeries   . Left elbow surgery       due to fracture   . Prostate biopsy N/A 10/25/2015    Procedure: PROSTATE BIOPSY AND ULTRASOUND;  Surgeon: [name], MD;  Location: [facility];  Service: Urology;  Laterality: N/A;  . Transurethral incision of bladder neck N/A 10/25/2015    Procedure:  TRANSURETHRAL INCISION OF BLADDER NECK;  Surgeon: [name], MD;  Location: [facility];  Service: Urology;  Laterality: N/A;  . Cystoscopy N/A 10/25/2015    Procedure: CYSTOSCOPY;  Surgeon: [name], MD;  Location: [facility];  Service: Urology;  Laterality: N/A;      Family History  Problem Relation Age of Onset  . Alzheimer's disease Father 4    Deceased  . Stomach cancer Father   . Heart attack Father   . Heart disease Father   . Skin cancer Mother     Facial-Living  . Cancer Mother     skin  . Alcohol abuse Sister     x2  . Mental illness Sister     x2  . Alcohol abuse Sister   . Diabetes Maternal Aunt     x2  . Thyroid disease Maternal Aunt     x4  . Diabetes Maternal Uncle   . Tuberculosis Paternal Grandfather   . Tuberculosis Paternal Grandmother   . Alzheimer's disease Paternal Aunt   . Alzheimer's disease Paternal Uncle   . Colon cancer Neg Hx   . Colon polyps Neg Hx   . Crohn's disease Neg Hx   . Ulcerative colitis Neg Hx       History  Drug Use No    Comment: hx of marijuana use   ,    History  Alcohol Use  . 3.6 oz/week  . 6 Cans of beer per week    Comment: daily  ,    History  Smoking status  . Former Smoker -- 4 years  . Quit date: 08/25/2013  Smokeless tobacco  . Former Systems developer  ,     History  Sexual Activity  . Sexual Activity: Yes  . Birth Control/ Protection: None      Patient's Medications  New Prescriptions   No medications on file  Previous Medications   ALBUTEROL  (  PROVENTIL HFA;VENTOLIN HFA) 108 (90 BASE) MCG/ACT INHALER    Inhale 2 puffs into the lungs every 6 (six) hours as needed for wheezing or shortness of breath.   ALPRAZOLAM (XANAX) 1 MG TABLET    Take 0.5 tablets (0.5 mg total) by mouth 3 (three) times daily as needed for anxiety.   AMLODIPINE (NORVASC) 10 MG TABLET    Take 1 tablet (10 mg total) by mouth daily.   BACLOFEN (LIORESAL) 10 MG TABLET    Take 1 tablet (10 mg total) by mouth 2 (two) times daily.   BREO ELLIPTA 100-25 MCG/INH AEPB    INHALE 1 PUFF BY MOUTH EVERY MORNING   CETIRIZINE (ZYRTEC) 10 MG TABLET    Take 10 mg by mouth daily.   DEXLANSOPRAZOLE (DEXILANT) 60 MG CAPSULE    Take 1 capsule (60 mg total) by mouth daily.   EPINEPHRINE 0.3 MG/0.3 ML IJ SOAJ INJECTION    Inject 0.3 mLs (0.3 mg total) into the muscle once.   FLUTICASONE (FLONASE) 50 MCG/ACT NASAL SPRAY    Place 2 sprays into both nostrils daily.   GABAPENTIN (NEURONTIN) 300 MG CAPSULE    Take 1 capsule (300 mg total) by mouth 3 (three) times daily as needed.   GUAIFENESIN (MUCINEX) 600 MG 12 HR TABLET    Take 1 tablet (600 mg total) by mouth 2 (two) times daily.   HYDROCODONE-ACETAMINOPHEN (NORCO/VICODIN) 5-325 MG TABLET    Take 1-2 tablets by mouth every 6 (six) hours as needed for moderate pain.   HYDROXYZINE (ATARAX/VISTARIL) 25 MG TABLET    Take 1 tablet by mouth every 8 (eight) hours as needed.   IPRATROPIUM-ALBUTEROL (DUONEB) 0.5-2.5 (3) MG/3ML SOLN    Take 3 mLs by nebulization every 4 (four) hours as needed (shortness of breath and wheezing).   LORATADINE (CLARITIN) 10 MG TABLET    Take 10 mg by mouth daily as needed for allergies. Reported on 01/25/2016   MECLIZINE (ANTIVERT) 25 MG TABLET    Take 25 mg by mouth 3 (three) times daily as needed for dizziness.   METOPROLOL SUCCINATE (TOPROL-XL) 25 MG 24 HR TABLET    Take 1 tablet (25 mg total) by mouth daily.   MONTELUKAST (SINGULAIR) 10 MG TABLET    TAKE 1 TABLET (10 MG TOTAL) BY MOUTH AT BEDTIME.   NITROGLYCERIN  (NITROSTAT) 0.4 MG SL TABLET    Place 1 tablet (0.4 mg total) under the tongue every 5 (five) minutes as needed for chest pain.   ONDANSETRON (ZOFRAN) 8 MG TABLET    Take 1 tablet by mouth daily as needed.   OXYGEN    Inhale 2 L into the lungs at bedtime. Reported on 11/25/2015   SODIUM CHLORIDE (OCEAN) 0.65 % SOLN NASAL SPRAY    Place 1 spray into both nostrils as needed for congestion.   TIOTROPIUM (SPIRIVA) 18 MCG INHALATION CAPSULE    Place 1 capsule (18 mcg total) into inhaler and inhale daily. IN AM   TRAMADOL (ULTRAM-ER) 300 MG 24 HR TABLET    Take 300 mg by mouth daily.   VITAMIN D, ERGOCALCIFEROL, (DRISDOL) 50000 UNITS CAPS CAPSULE    Take 50,000 Units by mouth every 7 (seven) days.  Modified Medications   No medications on file  Discontinued Medications   PREDNISONE (DELTASONE) 10 MG TABLET    Take 10 mg by mouth 4 (four) times daily. Pt has 20 mg but does not take   PRESCRIPTION MEDICATION    CPAP at night 4l/pm  Ivp dye; Levaquin; Penicillins; Aspirin; Nsaids; Tolmetin; Buprenorphine hcl; Morphine and related; and Oxycodone Outpatient Encounter Prescriptions as of 04/30/2016  Medication Sig Note  . albuterol (PROVENTIL HFA;VENTOLIN HFA) 108 (90 Base) MCG/ACT inhaler Inhale 2 puffs into the lungs every 6 (six) hours as needed for wheezing or shortness of breath. 11/22/2015: .   Marland Kitchen ALPRAZolam (XANAX) 1 MG tablet Take 0.5 tablets (0.5 mg total) by mouth 3 (three) times daily as needed for anxiety.   Marland Kitchen amLODipine (NORVASC) 10 MG tablet Take 1 tablet (10 mg total) by mouth daily.   . baclofen (LIORESAL) 10 MG tablet Take 1 tablet (10 mg total) by mouth 2 (two) times daily.   Marland Kitchen BREO ELLIPTA 100-25 MCG/INH AEPB INHALE 1 PUFF BY MOUTH EVERY MORNING   . cetirizine (ZYRTEC) 10 MG tablet Take 10 mg by mouth daily.   Marland Kitchen dexlansoprazole (DEXILANT) 60 MG capsule Take 1 capsule (60 mg total) by mouth daily.   Marland Kitchen EPINEPHrine 0.3 mg/0.3 mL IJ SOAJ injection Inject 0.3 mLs (0.3 mg total) into the  muscle once.   . fluticasone (FLONASE) 50 MCG/ACT nasal spray Place 2 sprays into both nostrils daily. (Patient taking differently: Place 2 sprays into both nostrils daily as needed for allergies. )   . gabapentin (NEURONTIN) 300 MG capsule Take 1 capsule (300 mg total) by mouth 3 (three) times daily as needed.   Marland Kitchen guaiFENesin (MUCINEX) 600 MG 12 hr tablet Take 1 tablet (600 mg total) by mouth 2 (two) times daily.   Marland Kitchen HYDROcodone-acetaminophen (NORCO/VICODIN) 5-325 MG tablet Take 1-2 tablets by mouth every 6 (six) hours as needed for moderate pain.   . hydrOXYzine (ATARAX/VISTARIL) 25 MG tablet Take 1 tablet by mouth every 8 (eight) hours as needed. 04/30/2016: Received from: External Pharmacy Received Sig: TK 1 T PO Q 8 H PRF ITCHING  . ipratropium-albuterol (DUONEB) 0.5-2.5 (3) MG/3ML SOLN Take 3 mLs by nebulization every 4 (four) hours as needed (shortness of breath and wheezing).   Marland Kitchen loratadine (CLARITIN) 10 MG tablet Take 10 mg by mouth daily as needed for allergies. Reported on 01/25/2016 11/22/2015: .   . meclizine (ANTIVERT) 25 MG tablet Take 25 mg by mouth 3 (three) times daily as needed for dizziness.   . metoprolol succinate (TOPROL-XL) 25 MG 24 hr tablet Take 1 tablet (25 mg total) by mouth daily.   . montelukast (SINGULAIR) 10 MG tablet TAKE 1 TABLET (10 MG TOTAL) BY MOUTH AT BEDTIME.   . nitroGLYCERIN (NITROSTAT) 0.4 MG SL tablet Place 1 tablet (0.4 mg total) under the tongue every 5 (five) minutes as needed for chest pain. 11/22/2015: .   . ondansetron (ZOFRAN) 8 MG tablet Take 1 tablet by mouth daily as needed. 04/30/2016: Received from: External Pharmacy Received Sig:   . OXYGEN Inhale 2 L into the lungs at bedtime. Reported on 11/25/2015   . sodium chloride (OCEAN) 0.65 % SOLN nasal spray Place 1 spray into both nostrils as needed for congestion.   Marland Kitchen tiotropium (SPIRIVA) 18 MCG inhalation capsule Place 1 capsule (18 mcg total) into inhaler and inhale daily. IN AM (Patient taking differently:  Place 18 mcg into inhaler and inhale at bedtime. ) 11/22/2015: .   . traMADol (ULTRAM-ER) 300 MG 24 hr tablet Take 300 mg by mouth daily.   . Vitamin D, Ergocalciferol, (DRISDOL) 50000 units CAPS capsule Take 50,000 Units by mouth every 7 (seven) days.   . [DISCONTINUED] PRESCRIPTION MEDICATION CPAP at night 4l/pm   . [DISCONTINUED] predniSONE (DELTASONE) 10  MG tablet Take 10 mg by mouth 4 (four) times daily. Pt has 20 mg but does not take    No facility-administered encounter medications on file as of 04/30/2016.      There are no preventive care reminders to display for this patient.   Immunization History  Administered Date(s) Administered  . Influenza Split 07/15/2013  . Influenza,inj,Quad PF,36+ Mos 07/14/2014, 09/06/2015  . Influenza-Unspecified 03/14/2015  . Pneumococcal Conjugate-13 08/13/2014  . Pneumococcal Polysaccharide-23 08/15/2010, 06/21/2015     <no information>   Fall Risk  12/12/2015 11/22/2014  Falls in the past year? Yes Yes  Number falls in past yr: 2 or more 1  Injury with Fall? Yes Yes  Risk Factor Category  - High Fall Risk  Risk for fall due to : Impaired balance/gait History of fall(s)  Risk for fall due to (comments): 1 Ran into a branch,2. Backed off a porch -  Follow up Falls prevention discussed -     Depression screen [facility] 2/9 12/12/2015 11/22/2014  Decreased Interest 1 0  Down, Depressed, Hopeless 1 0  PHQ - 2 Score 2 0  Altered sleeping 1 -  Tired, decreased energy 1 -  Change in appetite 1 -  Feeling bad or failure about yourself  1 -  Trouble concentrating 1 -  Moving slowly or fidgety/restless 1 -  Suicidal thoughts 1 -  PHQ-9 Score 9 -  Difficult doing work/chores Somewhat difficult -     Review of Systems:   ( Completed via Adult Medical History Intake form today ) General:   Denies fever, chills, appetite changes, + unexplained weight loss/gain- says drinking or not can  make his wt swing widely.  Optho/Auditory:   Denies visual  changes, blurred vision/LOV, + ringing in ears/ diff hearing Respiratory:   Denies SOB, DOE, cough, wheezing.  Cardiovascular:   Denies chest pain, palpitations, new onset peripheral edema  Gastrointestinal:   Denies vomiting, diarrhea, occ nauseated  Genitourinary:    Denies dysuria, increased frequency, flank pain.  Endocrine:     Denies hot or cold intolerance, polyuria, polydipsia. Musculoskeletal:  Denies unexplained myalgias, joint swelling, Has +arthralgias,+ gait problems.  Skin:  Denies rash, + suspicious lesions or new/ changes in moles- sees Derm Neurological:    Denies dizziness, syncope, unexplained weakness, lightheadedness, numbness  Psychiatric/Behavioral:   Denies mood changes, suicidal or homicidal ideations, hallucinations    Objective:   Blood pressure 120/75, pulse 73, height 5\' 11"  (1.803 m), weight 222 lb 4.8 oz (100.835 kg). Body mass index is 31.02 kg/(m^2).  General: Well Developed, well nourished, and in no acute distress.  Neuro: Alert and oriented x3, extra-ocular muscles intact, sensation grossly intact.  HEENT: Normocephalic, atraumatic, pupils equal round reactive to light, neck supple, no gross masses, no carotid bruits Skin: no gross suspicious lesions or rashes  Cardiac: Regular rate and rhythm, no murmurs rubs or gallops.  Respiratory: Essentially clear to auscultation bilaterally. Not using accessory muscles, speaking in full sentences.  Abdominal: Soft, mildly distended Musculoskeletal: Ambulates w/o diff, FROM * 4 ext.  Vasc: less 2 sec cap RF, warm and pink  Psych:  No HI/SI, judgement and insight good.    Impression and Recommendations:    The patient was counseled, risk factors were discussed, anticipatory guidance given.  These meds I did not order today----> pt already on them and CMA logged them into computer today as history of taking those meds: Meds ordered this encounter  Medications  . meclizine (ANTIVERT) 25 MG  tablet    Sig:  Take 25 mg by mouth 3 (three) times daily as needed for dizziness.  . hydrOXYzine (ATARAX/VISTARIL) 25 MG tablet    Sig: Take 1 tablet by mouth every 8 (eight) hours as needed.    Refill:  0  . ondansetron (ZOFRAN) 8 MG tablet    Sig: Take 1 tablet by mouth daily as needed.  . traMADol (ULTRAM-ER) 300 MG 24 hr tablet    Sig: Take 300 mg by mouth daily.  . Vitamin D, Ergocalciferol, (DRISDOL) 50000 units CAPS capsule    Sig: Take 50,000 Units by mouth every 7 (seven) days.  . cetirizine (ZYRTEC) 10 MG tablet    Sig: Take 10 mg by mouth daily.  . sodium chloride (OCEAN) 0.65 % SOLN nasal spray    Sig: Place 1 spray into both nostrils as needed for congestion.   _____________________________________________________________________________________  Diabetes mellitus without complication- (diet controlled) A1c today is 5.9 which is down from 6.8 back in February 2017. Patient admits that when he avoids heavy alcohol use, his sugars are well controlled.  Extensive counseling done as pt states he was never told about this Diagnosis in past  Heavy alcohol use Abstinence recommended.  Whenever he drinks less, he admits his sugar levels are better control.   May also be the reason for patient's concern of memory today which we will address next office visit.  We will give him a Mini-Mental exam and obtain lab work.  Obesity Counseling done of connection btwn weight and MMP; rec wt loss.  Education provided  concerns for memory loss Will f/up with MME next OV and dementia panel.   Basal cell carcinoma of back Dermatologist- Dr Nevada Crane;  Has upcoming planned MOHS sx- Dr Levada Dy  OSA (obstructive sleep apnea) Pt states he can't stand the CPAP machine on his face.  He has f/up OV with Pulm (Tammy Parrett, NP) near future to address this.  Told pt to ask what other options he is candidate for  COPD (chronic obstructive pulmonary disease) Gold C Frequent exacerbations Stable.  No acute complaints.   Appt with Pulm near future.    Hypertension Well controlled currently. Continue current meds.    Orders Placed This Encounter  Procedures  . Microalbumin / creatinine urine ratio  . Microalbumin / creatinine urine ratio  . COMPLETE METABOLIC PANEL WITH GFR  . Lipid panel  . Magnesium  . Phosphorus  . RPR  . Vitamin B12  . Sedimentation rate  . CBC  . TSH  . Folate  . VITAMIN D 25 Hydroxy (Vit-D Deficiency, Fractures)  . Ambulatory referral to Ophthalmology  . POCT HgB A1C    Please see AVS handed out to patient at the end of our visit for further patient instructions/ counseling done pertaining to today's office visit.     Note: This document was prepared using Dragon voice recognition software and may include unintentional dictation errors.

## 2016-04-30 NOTE — Assessment & Plan Note (Signed)
Dermatologist- Dr Nevada Crane;  Has upcoming planned MOHS sx- Dr Levada Dy

## 2016-04-30 NOTE — Assessment & Plan Note (Addendum)
A1c today is 5.9 which is down from 6.8 back in February 2017. Patient admits that when he avoids heavy alcohol use, his sugars are well controlled.  Extensive counseling done as pt states he was never told about this Diagnosis in past

## 2016-04-30 NOTE — Assessment & Plan Note (Signed)
Stable.  No acute complaints.  Appt with Pulm near future.

## 2016-04-30 NOTE — Patient Instructions (Addendum)
Keep a check on your blood pressure. Patient was given a blood pressure log today. He will follow-up in a couple of weeks for evaluation of his memory and to get fasting blood work.      Risk factors for prediabetes and type 2 diabetes Researchers don't fully understand why some people develop prediabetes and type 2 diabetes and others don't.  It's clear that certain factors increase the risk, however, including:  Weight. The more fatty tissue you have, the more resistant your cells become to insulin.  Inactivity. The less active you are, the greater your risk. Physical activity helps you control your weight, uses up glucose as energy and makes your cells more sensitive to insulin.  Family history. Your risk increases if a parent or sibling has type 2 diabetes.  Race. Although it's unclear why, people of certain races - including blacks, Hispanics, American Indians and Asian-Americans - are at higher risk.  Age. Your risk increases as you get older. This may be because you tend to exercise less, lose muscle mass and gain weight as you age. But type 2 diabetes is also increasing dramatically among children, adolescents and younger adults.  Gestational diabetes. If you developed gestational diabetes when you were pregnant, your risk of developing prediabetes and type 2 diabetes later increases. If you gave birth to a baby weighing more than 9 pounds (4 kilograms), you're also at risk of type 2 diabetes.  Polycystic ovary syndrome. For women, having polycystic ovary syndrome - a common condition characterized by irregular menstrual periods, excess hair growth and obesity - increases the risk of diabetes.  High blood pressure. Having blood pressure over 140/90 millimeters of mercury (mm Hg) is linked to an increased risk of type 2 diabetes.  Abnormal cholesterol and triglyceride levels. If you have low levels of high-density lipoprotein (HDL), or "good," cholesterol, your risk of type 2 diabetes is  higher. Triglycerides are another type of fat carried in the blood. People with high levels of triglycerides have an increased risk of type 2 diabetes. Your doctor can let you know what your cholesterol and triglyceride levels are.    A good guide to good carbs: The glycemic index ---If you have diabetes, or at risk for diabetes, you know all too well that when you eat carbohydrates, your blood sugar goes up. The total amount of carbs you consume at a meal or in a snack mostly determines what your blood sugar will do. But the food itself also plays a role. A serving of white rice has almost the same effect as eating pure table sugar - a quick, high spike in blood sugar. A serving of lentils has a slower, smaller effect.  ---Picking good sources of carbs can help you control your blood sugar and your weight. Even if you don't have diabetes, eating healthier carbohydrate-rich foods can help ward off a host of chronic conditions, from heart disease to various cancers to, well, diabetes.  ---One way to choose foods is with the glycemic index (GI). This tool measures how much a food boosts blood sugar.  The glycemic index rates the effect of a specific amount of a food on blood sugar compared with the same amount of pure glucose. A food with a glycemic index of 28 boosts blood sugar only 28% as much as pure glucose. One with a GI of 95 acts like pure glucose.  High glycemic foods result in a quick spike in insulin and blood sugar (also known as blood glucose). Low  glycemic foods have a slower, smaller effect.   Using the glycemic index Using the glycemic index is easy: choose foods in the low GI category instead of those in the high GI category (see below), and go easy on those in between. Low glycemic index (GI of 55 or less): Most fruits and vegetables, beans, minimally processed grains, pasta, low-fat dairy foods, and nuts.  Moderate glycemic index (GI 56 to 69): White and sweet potatoes, corn, white  rice, couscous, breakfast cereals such as Cream of Wheat and Mini Wheats.  High glycemic index (GI of 70 or higher): White bread, rice cakes, most crackers, bagels, cakes, doughnuts, croissants, most packaged breakfast cereals. You can see the values for 100 commons foods and get links to more at www.health.CheapToothpicks.si.  Swaps for lowering glycemic index  Instead of this high-glycemic index food Eat this lower-glycemic index food  White rice Brown rice or converted rice  Instant oatmeal Steel-cut oats  Cornflakes Bran flakes  Baked potato Pasta, bulgur  White bread Whole-grain bread  Corn Peas or leafy greens           Blood Glucose Monitoring, Adult Monitoring your blood glucose (also know as blood sugar) helps you to manage your diabetes. It also helps you and your health care provider monitor your diabetes and determine how well your treatment plan is working. WHY SHOULD YOU MONITOR YOUR BLOOD GLUCOSE?  It can help you understand how food, exercise, and medicine affect your blood glucose.  It allows you to know what your blood glucose is at any given moment. You can quickly tell if you are having low blood glucose (hypoglycemia) or high blood glucose (hyperglycemia).  It can help you and your health care provider know how to adjust your medicines.  It can help you understand how to manage an illness or adjust medicine for exercise. WHEN SHOULD YOU TEST? Your health care provider will help you decide how often you should check your blood glucose. This may depend on the type of diabetes you have, your diabetes control, or the types of medicines you are taking. Be sure to write down all of your blood glucose readings so that this information can be reviewed with your health care provider. See below for examples of testing times that your health care provider may suggest. Type 1 Diabetes  Test at least 2 times per day if your diabetes is well controlled, if you are using  an insulin pump, or if you perform multiple daily injections.  If your diabetes is not well controlled or if you are sick, you may need to test more often.  It is a good idea to also test:  Before every insulin injection.  Before and after exercise.  Between meals and 2 hours after a meal.  Occasionally between 2:00 a.m. and 3:00 a.m. Type 2 Diabetes  If you are taking insulin, test at least 2 times per day. However, it is best to test before every insulin injection.  If you take medicines by mouth (orally), test 2 times a day.  If you are on a controlled diet, test once a day.  If your diabetes is not well controlled or if you are sick, you may need to monitor more often. HOW TO MONITOR YOUR BLOOD GLUCOSE Supplies Needed  Blood glucose meter.  Test strips for your meter. Each meter has its own strips. You must use the strips that go with your own meter.  A pricking needle (lancet).  A device that holds  the lancet (lancing device).  A journal or log book to write down your results. Procedure  Wash your hands with soap and water. Alcohol is not preferred.  Prick the side of your finger (not the tip) with the lancet.  Gently milk the finger until a small drop of blood appears.  Follow the instructions that come with your meter for inserting the test strip, applying blood to the strip, and using your blood glucose meter. Other Areas to Get Blood for Testing Some meters allow you to use other areas of your body (other than your finger) to test your blood. These areas are called alternative sites. The most common alternative sites are:  The forearm.  The thigh.  The back area of the lower leg.  The palm of the hand. The blood flow in these areas is slower. Therefore, the blood glucose values you get may be delayed, and the numbers are different from what you would get from your fingers. Do not use alternative sites if you think you are having hypoglycemia. Your reading  will not be accurate. Always use a finger if you are having hypoglycemia. Also, if you cannot feel your lows (hypoglycemia unawareness), always use your fingers for your blood glucose checks. ADDITIONAL TIPS FOR GLUCOSE MONITORING  Do not reuse lancets.  Always carry your supplies with you.  All blood glucose meters have a 24-hour "hotline" number to call if you have questions or need help.  Adjust (calibrate) your blood glucose meter with a control solution after finishing a few boxes of strips. BLOOD GLUCOSE RECORD KEEPING It is a good idea to keep a daily record or log of your blood glucose readings. Most glucose meters, if not all, keep your glucose records stored in the meter. Some meters come with the ability to download your records to your home computer. Keeping a record of your blood glucose readings is especially helpful if you are wanting to look for patterns. Make notes to go along with the blood glucose readings because you might forget what happened at that exact time. Keeping good records helps you and your health care provider to work together to achieve good diabetes management.    This information is not intended to replace advice given to you by your health care provider. Make sure you discuss any questions you have with your health care provider.   Document Released: 10/04/2003 Document Revised: 10/22/2014 Document Reviewed: 02/23/2013 Elsevier Interactive Patient Education [employer].

## 2016-04-30 NOTE — Assessment & Plan Note (Signed)
Well controlled currently. Continue current meds.

## 2016-04-30 NOTE — Assessment & Plan Note (Addendum)
Abstinence recommended.  Whenever he drinks less, he admits his sugar levels are better control.   May also be the reason for patient's concern of memory today which we will address next office visit.  We will give him a Mini-Mental exam and obtain lab work.

## 2016-04-30 NOTE — Assessment & Plan Note (Signed)
Will f/up with MME next OV and dementia panel.

## 2016-04-30 NOTE — Assessment & Plan Note (Signed)
Counseling done of connection btwn weight and MMP; rec wt loss.  Education provided

## 2016-04-30 NOTE — Assessment & Plan Note (Addendum)
Pt states he can't stand the CPAP machine on his face.  He has f/up OV with Pulm (Tammy Parrett, NP) near future to address this.  Told pt to ask what other options he is candidate for

## 2016-05-01 ENCOUNTER — Telehealth: Payer: Self-pay

## 2016-05-01 LAB — MICROALBUMIN / CREATININE URINE RATIO
CREATININE, URINE: 285 mg/dL (ref 20–370)
MICROALB UR: 1.6 mg/dL
Microalb Creat Ratio: 6 mcg/mg creat (ref <30)

## 2016-05-01 NOTE — Telephone Encounter (Signed)
Call [facility].

## 2016-05-02 DIAGNOSIS — M170 Bilateral primary osteoarthritis of knee: Secondary | ICD-10-CM | POA: Diagnosis not present

## 2016-05-02 DIAGNOSIS — M25561 Pain in right knee: Secondary | ICD-10-CM | POA: Diagnosis not present

## 2016-05-02 DIAGNOSIS — M1712 Unilateral primary osteoarthritis, left knee: Secondary | ICD-10-CM | POA: Diagnosis not present

## 2016-05-02 DIAGNOSIS — M25562 Pain in left knee: Secondary | ICD-10-CM | POA: Diagnosis not present

## 2016-05-02 DIAGNOSIS — R2689 Other abnormalities of gait and mobility: Secondary | ICD-10-CM | POA: Diagnosis not present

## 2016-05-02 NOTE — Telephone Encounter (Signed)
Left a message with the patient advising of what to expect at the eye care center.

## 2016-05-03 ENCOUNTER — Other Ambulatory Visit: Payer: Self-pay | Admitting: Physician Assistant

## 2016-05-07 ENCOUNTER — Other Ambulatory Visit (INDEPENDENT_AMBULATORY_CARE_PROVIDER_SITE_OTHER): Payer: Medicare Other

## 2016-05-07 DIAGNOSIS — E559 Vitamin D deficiency, unspecified: Secondary | ICD-10-CM

## 2016-05-07 DIAGNOSIS — F1090 Alcohol use, unspecified, uncomplicated: Secondary | ICD-10-CM

## 2016-05-07 DIAGNOSIS — Z789 Other specified health status: Secondary | ICD-10-CM

## 2016-05-07 DIAGNOSIS — Z87820 Personal history of traumatic brain injury: Secondary | ICD-10-CM

## 2016-05-07 DIAGNOSIS — I10 Essential (primary) hypertension: Secondary | ICD-10-CM

## 2016-05-07 DIAGNOSIS — E119 Type 2 diabetes mellitus without complications: Secondary | ICD-10-CM | POA: Diagnosis not present

## 2016-05-07 DIAGNOSIS — R413 Other amnesia: Secondary | ICD-10-CM

## 2016-05-07 DIAGNOSIS — E669 Obesity, unspecified: Secondary | ICD-10-CM

## 2016-05-08 LAB — PHOSPHORUS: PHOSPHORUS: 3 mg/dL (ref 2.1–4.3)

## 2016-05-08 LAB — COMPLETE METABOLIC PANEL WITH GFR
ALBUMIN: 4 g/dL (ref 3.6–5.1)
ALT: 59 U/L — AB (ref 9–46)
AST: 42 U/L — ABNORMAL HIGH (ref 10–35)
Alkaline Phosphatase: 53 U/L (ref 40–115)
BILIRUBIN TOTAL: 0.4 mg/dL (ref 0.2–1.2)
BUN: 12 mg/dL (ref 7–25)
CO2: 26 mmol/L (ref 20–31)
CREATININE: 1.22 mg/dL (ref 0.70–1.25)
Calcium: 8.9 mg/dL (ref 8.6–10.3)
Chloride: 103 mmol/L (ref 98–110)
GFR, Est African American: 71 mL/min (ref >=60)
GFR, Est Non African American: 61 mL/min (ref >=60)
GLUCOSE: 113 mg/dL — AB (ref 65–99)
Potassium: 5.1 mmol/L (ref 3.5–5.3)
SODIUM: 139 mmol/L (ref 135–146)
TOTAL PROTEIN: 6.2 g/dL (ref 6.1–8.1)

## 2016-05-08 LAB — MAGNESIUM: MAGNESIUM: 1.9 mg/dL (ref 1.5–2.5)

## 2016-05-08 LAB — FOLATE: Folate: 20.1 ng/mL (ref >5.4)

## 2016-05-08 LAB — CBC
HCT: 42.2 % (ref 38.5–50.0)
Hemoglobin: 14.3 g/dL (ref 13.2–17.1)
MCH: 32.3 pg (ref 27.0–33.0)
MCHC: 33.9 g/dL (ref 32.0–36.0)
MCV: 95.3 fL (ref 80.0–100.0)
MPV: 10.8 fL (ref 7.5–12.5)
PLATELETS: 234 10*3/uL (ref 140–400)
RBC: 4.43 MIL/uL (ref 4.20–5.80)
RDW: 13.3 % (ref 11.0–15.0)
WBC: 5.4 10*3/uL (ref 3.8–10.8)

## 2016-05-08 LAB — LIPID PANEL
Cholesterol: 241 mg/dL — ABNORMAL HIGH (ref 125–200)
HDL: 59 mg/dL (ref >=40)
LDL CALC: 150 mg/dL — AB (ref <130)
Total CHOL/HDL Ratio: 4.1 Ratio (ref <=5.0)
Triglycerides: 162 mg/dL — ABNORMAL HIGH (ref <150)
VLDL: 32 mg/dL — ABNORMAL HIGH (ref <30)

## 2016-05-08 LAB — RPR

## 2016-05-08 LAB — VITAMIN D 25 HYDROXY (VIT D DEFICIENCY, FRACTURES): VIT D 25 HYDROXY: 24 ng/mL — AB (ref 30–100)

## 2016-05-08 LAB — SEDIMENTATION RATE: SED RATE: 4 mm/h (ref 0–20)

## 2016-05-08 LAB — TSH: TSH: 2 m[IU]/L (ref 0.40–4.50)

## 2016-05-08 LAB — VITAMIN B12: VITAMIN B 12: 547 pg/mL (ref 200–1100)

## 2016-05-08 NOTE — Progress Notes (Signed)
I will discuss the results of these tests with the patient at our upcoming planned follow-up office visit.

## 2016-05-09 ENCOUNTER — Ambulatory Visit: Payer: Self-pay | Admitting: Adult Health

## 2016-05-14 DIAGNOSIS — M25562 Pain in left knee: Secondary | ICD-10-CM | POA: Diagnosis not present

## 2016-05-14 DIAGNOSIS — M170 Bilateral primary osteoarthritis of knee: Secondary | ICD-10-CM | POA: Diagnosis not present

## 2016-05-14 DIAGNOSIS — R2689 Other abnormalities of gait and mobility: Secondary | ICD-10-CM | POA: Diagnosis not present

## 2016-05-14 DIAGNOSIS — M1712 Unilateral primary osteoarthritis, left knee: Secondary | ICD-10-CM | POA: Diagnosis not present

## 2016-05-14 DIAGNOSIS — M25561 Pain in right knee: Secondary | ICD-10-CM | POA: Diagnosis not present

## 2016-05-15 ENCOUNTER — Encounter: Payer: Self-pay | Admitting: Adult Health

## 2016-05-15 ENCOUNTER — Ambulatory Visit (INDEPENDENT_AMBULATORY_CARE_PROVIDER_SITE_OTHER): Payer: Medicare Other | Admitting: Adult Health

## 2016-05-15 VITALS — BP 132/80 | HR 54 | Temp 98.2°F | Ht 71.0 in | Wt 223.0 lb

## 2016-05-15 DIAGNOSIS — J449 Chronic obstructive pulmonary disease, unspecified: Secondary | ICD-10-CM | POA: Diagnosis not present

## 2016-05-15 DIAGNOSIS — G4733 Obstructive sleep apnea (adult) (pediatric): Secondary | ICD-10-CM

## 2016-05-15 NOTE — Patient Instructions (Signed)
Continue on BREO daily , rinse well after use.  Refer to pulmonary rehab.  Follow up Dr. Elsworth Soho  In 4 months and As needed   Please contact office for sooner follow up if symptoms do not improve or worsen or seek emergency care

## 2016-05-15 NOTE — Assessment & Plan Note (Signed)
Doing okay without flare   Plan  Continue on BREO daily , rinse well after use.  Refer to pulmonary rehab.  Follow up Dr. Elsworth Soho  In 4 months and As needed   Please contact office for sooner follow up if symptoms do not improve or worsen or seek emergency care

## 2016-05-15 NOTE — Assessment & Plan Note (Signed)
Mild OSA with CPAP intolerance  Pt advised to not drive if sleepy  Healthy sleep regimen discussed .

## 2016-05-15 NOTE — Progress Notes (Signed)
Subjective:    Patient ID: [name], male    DOB: February 02, 1951, 65 y.o.   MRN: [id number]  HPI 65 yo former smoker with COPD , former smoker and OSA .   TEST :  [name] 07/08/14: FeV1 51% FeV1/FVC 66% FVC 59% 10/5/2015ONO RA was normal  10/13/2014  ONO on RA NORMAL  11/25/15 Follow up COPD /OSA  Pt returns for 6 month follow up .  Has Mild OSA ,started on CPAP . He says he did not like this and stopped wearing it. DME came and got this from him due to non compliance.  He does not wish to restart this.   Pt has Moderate COPD . On BREO .  Unable to tolerate Spiriva due to urinary issues.  PVX and Prevnar are utd.  He says overall that his breathing is doing okay. He denies any flare cough or wheezing. Patient denies chest pain, orthopnea, PND, increased leg swelling or hemoptysis. Chest x-ray February 2017 showed stable chronic changes.  He would like a referral back to pulmonary rehabilitation. He was referred once before but able to start this.  Past Medical History:  Diagnosis Date  . Anxiety   . Anxiety disorder   . Arthritis    BILATERAL SHOULDERS, ELBOWS AND HANDS AND LEFT HIP AND KNEES--HX CORTISONE SHOTS IN SHOULDERS, ELBOWS, HIP AND KNEES  . Basal cell carcinoma of back 04/30/2016   Dermatologist- Dr Nevada Crane;   MOHS sx- Dr Levada Dy   . Bladder outlet obstruction   . Chronic idiopathic constipation 05/08/2015  . Complication of anesthesia    DIFFICULT WAKING   . COPD (chronic obstructive pulmonary disease) [place] C Frequent exacerbations 01/13/2014   [name] 07/08/14: FeV1 51% FeV1/FVC 66% FVC 59% 10/5/2015ONO RA was normal  10/13/2014  ONO on RA NORMAL   . COPD, frequent exacerbations ([place])    pulmologist-  dr Joya Gaskins--  [place] Rm Stage C.04-25-15 recent COPD exacerbation-much improved now, after tx. in ER [facility].  Marland Kitchen Depression   . Diabetes mellitus without complication ([place])    BODERLINE - DIET CONTROL  . Dysrhythmia    PVC'S  . Family history of adverse reaction to  anesthesia    father would wake up with agitation   . Former smoker 01/13/2014  . Gastroesophageal reflux disease without esophagitis   . GERD (gastroesophageal reflux disease)   . Heavy alcohol use 04/30/2016  . History of chronic bronchitis   . History of oxygen administration    oxygen use 2 l/m nasally at bedtime and exertional occasions  . History of rheumatic fever   . History of TB (tuberculosis)    1984--  hospitalized for 4 month treatment  . History of urinary retention   . Hx of multiple concussions    x 2 per patient   . Hypertension   . Melanoma ([place])   . Nocturnal oxygen desaturation    USES O2 NIGHTLY  . OSA (obstructive sleep apnea) 01/13/2014  . Pneumonia    hx of   . PONV (postoperative nausea and vomiting)   . Post-polio syndrome    polio at age 59--PT WAS IN IRON LUNG; PT WAS IN W/C UNTIL AGE 13; STILL HAS WEAKNESS RIGHT SIDE  . Prostate cancer ([place])   . Prostate cancer ([place])   . Schizophrenia ([place])   . Shortness of breath dyspnea    RIGHT HEMIDIAPHRAGM ELEVATION - POST POLIO SYNDROME  . Tuberculosis    Hosp 4 months rx , left early  Current Outpatient Prescriptions on File Prior to Visit  Medication Sig Dispense Refill  . albuterol (PROVENTIL HFA;VENTOLIN HFA) 108 (90 Base) MCG/ACT inhaler Inhale 2 puffs into the lungs every 6 (six) hours as needed for wheezing or shortness of breath. 3 Inhaler 3  . ALPRAZolam (XANAX) 1 MG tablet Take 0.5 tablets (0.5 mg total) by mouth 3 (three) times daily as needed for anxiety. 90 tablet 0  . amLODipine (NORVASC) 10 MG tablet Take 1 tablet (10 mg total) by mouth daily. 90 tablet 1  . baclofen (LIORESAL) 10 MG tablet Take 1 tablet (10 mg total) by mouth 2 (two) times daily. 60 each 0  . BREO ELLIPTA 100-25 MCG/INH AEPB INHALE 1 PUFF BY MOUTH EVERY MORNING 180 each 0  . dexlansoprazole (DEXILANT) 60 MG capsule Take 1 capsule (60 mg total) by mouth daily. 90 capsule 3  . EPINEPHrine 0.3 mg/0.3 mL IJ SOAJ injection Inject 0.3  mLs (0.3 mg total) into the muscle once. 1 Device 1  . HYDROcodone-acetaminophen (NORCO/VICODIN) 5-325 MG tablet Take 1-2 tablets by mouth every 6 (six) hours as needed for moderate pain. 60 tablet 0  . ipratropium-albuterol (DUONEB) 0.5-2.5 (3) MG/3ML SOLN Take 3 mLs by nebulization every 4 (four) hours as needed (shortness of breath and wheezing).    . metoprolol succinate (TOPROL-XL) 25 MG 24 hr tablet Take 1 tablet (25 mg total) by mouth daily. 90 tablet 1  . nitroGLYCERIN (NITROSTAT) 0.4 MG SL tablet Place 1 tablet (0.4 mg total) under the tongue every 5 (five) minutes as needed for chest pain. 30 tablet 3  . ondansetron (ZOFRAN) 8 MG tablet Take 1 tablet by mouth daily as needed.    . OXYGEN Inhale 2 L into the lungs at bedtime. Reported on 11/25/2015    . sodium chloride (OCEAN) 0.65 % SOLN nasal spray Place 1 spray into both nostrils as needed for congestion.    . traMADol (ULTRAM-ER) 300 MG 24 hr tablet Take 300 mg by mouth daily.    . Vitamin D, Ergocalciferol, (DRISDOL) 50000 units CAPS capsule Take 50,000 Units by mouth every 7 (seven) days.    . cetirizine (ZYRTEC) 10 MG tablet Take 10 mg by mouth daily.    . fluticasone (FLONASE) 50 MCG/ACT nasal spray Place 2 sprays into both nostrils daily. (Patient not taking: Reported on 05/15/2016) 16 g 6  . gabapentin (NEURONTIN) 300 MG capsule Take 1 capsule (300 mg total) by mouth 3 (three) times daily as needed. (Patient not taking: Reported on 05/15/2016) 90 capsule 1  . guaiFENesin (MUCINEX) 600 MG 12 hr tablet Take 1 tablet (600 mg total) by mouth 2 (two) times daily. (Patient not taking: Reported on 05/15/2016) 15 tablet 0  . hydrOXYzine (ATARAX/VISTARIL) 25 MG tablet Take 1 tablet by mouth every 8 (eight) hours as needed.  0  . loratadine (CLARITIN) 10 MG tablet Take 10 mg by mouth daily as needed for allergies. Reported on 01/25/2016  5  . meclizine (ANTIVERT) 25 MG tablet Take 25 mg by mouth 3 (three) times daily as needed for dizziness.    .  montelukast (SINGULAIR) 10 MG tablet TAKE 1 TABLET (10 MG TOTAL) BY MOUTH AT BEDTIME. (Patient not taking: Reported on 05/15/2016) 90 tablet 1   No current facility-administered medications on file prior to visit.         Review of Systems  Constitutional:   No  weight loss, night sweats,  Fevers, chills, fatigue, or  lassitude.  HEENT:   No headaches,  Difficulty swallowing,  Tooth/dental problems, or  Sore throat,                No sneezing, itching, ear ache, nasal congestion, post nasal drip,   CV:  No chest pain,  Orthopnea, PND, swelling in lower extremities, anasarca, dizziness, palpitations, syncope.   GI  No heartburn, indigestion, abdominal pain, nausea, vomiting, diarrhea, change in bowel habits, loss of appetite, bloody stools.   Resp: .  No chest wall deformity  Skin: no rash or lesions.  GU: no dysuria, change in color of urine, no urgency or frequency.  No flank pain, no hematuria   MS:  No joint pain or swelling.  No decreased range of motion.  No back pain.  Psych:  No change in mood or affect. No depression or anxiety.  No memory loss.          Objective:   Physical Exam Vitals:   05/15/16 1153  BP: 132/80  Pulse: (!) 54  Temp: 98.2 F (36.8 C)  TempSrc: Oral  SpO2: 98%  Weight: 223 lb (101.2 kg)  Height: 5\' 11"  (1.803 m)   Body mass index is 31.1 kg/m.    GEN: A/Ox3; pleasant , NAD ,    HEENT:  [place]/AT,  EACs-clear, TMs-wnl, NOSE-clear, THROAT-clear, no lesions, no postnasal drip or exudate noted.   NECK:  Supple w/ fair ROM; no JVD; normal carotid impulses w/o bruits; no thyromegaly or nodules palpated; no lymphadenopathy.    RESP  Decreased BS , no wheezing . no accessory muscle use, no dullness to percussion  CARD:  RRR, no m/r/g  , no peripheral edema, pulses intact, no cyanosis or clubbing.  GI:   Soft & nt; nml bowel sounds; no organomegaly or masses detected.   Musco: Warm bil, no deformities or joint swelling noted.   Neuro:  alert, no focal deficits noted.    Skin: Warm, no lesions or rashes   Marletta Bousquet NP-C  [facility]  05/15/2016

## 2016-05-18 ENCOUNTER — Telehealth: Payer: Self-pay | Admitting: Adult Health

## 2016-05-18 NOTE — Telephone Encounter (Signed)
LMTCB

## 2016-05-21 NOTE — Telephone Encounter (Signed)
Spoke with pt. States that he would like recommendations on an exercise program. Advised him that we have referred him to pulmonary rehab. States that is not great but that's not what he's referring to, wants an exercise program that's going to help him lose weight.  TP - please advise. Thanks.

## 2016-05-21 NOTE — Telephone Encounter (Signed)
Per TP Okay to recommend silver sneakers or [employer].   LVM for pt to return call.

## 2016-05-22 NOTE — Telephone Encounter (Signed)
lmtcb X2 for pt to relay recs.  

## 2016-05-23 DIAGNOSIS — C44519 Basal cell carcinoma of skin of other part of trunk: Secondary | ICD-10-CM | POA: Diagnosis not present

## 2016-05-23 NOTE — Telephone Encounter (Signed)
lmomtcb x 3 for pt 

## 2016-05-24 NOTE — Telephone Encounter (Signed)
lmtcb x4 for pt. Message will be closed per triage protocol.

## 2016-05-28 ENCOUNTER — Ambulatory Visit (INDEPENDENT_AMBULATORY_CARE_PROVIDER_SITE_OTHER): Payer: Medicare Other | Admitting: Family Medicine

## 2016-05-28 ENCOUNTER — Encounter: Payer: Self-pay | Admitting: Family Medicine

## 2016-05-28 VITALS — BP 141/76 | HR 65 | Wt 224.9 lb

## 2016-05-28 DIAGNOSIS — E119 Type 2 diabetes mellitus without complications: Secondary | ICD-10-CM

## 2016-05-28 DIAGNOSIS — E669 Obesity, unspecified: Secondary | ICD-10-CM | POA: Diagnosis not present

## 2016-05-28 DIAGNOSIS — R413 Other amnesia: Secondary | ICD-10-CM | POA: Diagnosis not present

## 2016-05-28 DIAGNOSIS — Z87820 Personal history of traumatic brain injury: Secondary | ICD-10-CM

## 2016-05-28 DIAGNOSIS — Z789 Other specified health status: Secondary | ICD-10-CM

## 2016-05-28 DIAGNOSIS — Z9119 Patient's noncompliance with other medical treatment and regimen: Secondary | ICD-10-CM

## 2016-05-28 DIAGNOSIS — F1090 Alcohol use, unspecified, uncomplicated: Secondary | ICD-10-CM

## 2016-05-28 DIAGNOSIS — Z91199 Patient's noncompliance with other medical treatment and regimen due to unspecified reason: Secondary | ICD-10-CM

## 2016-05-28 DIAGNOSIS — E782 Mixed hyperlipidemia: Secondary | ICD-10-CM | POA: Diagnosis not present

## 2016-05-28 DIAGNOSIS — I10 Essential (primary) hypertension: Secondary | ICD-10-CM

## 2016-05-28 DIAGNOSIS — R748 Abnormal levels of other serum enzymes: Secondary | ICD-10-CM

## 2016-05-28 DIAGNOSIS — J449 Chronic obstructive pulmonary disease, unspecified: Secondary | ICD-10-CM

## 2016-05-28 DIAGNOSIS — E559 Vitamin D deficiency, unspecified: Secondary | ICD-10-CM

## 2016-05-28 MED ORDER — ATORVASTATIN CALCIUM 40 MG PO TABS: ORAL_TABLET | ORAL | 1 refills | Status: DC

## 2016-05-28 MED ORDER — VITAMIN D3 125 MCG (5000 UT) PO TABS: ORAL_TABLET | ORAL | 3 refills | Status: DC

## 2016-05-28 MED ORDER — LOSARTAN POTASSIUM 50 MG PO TABS: ORAL_TABLET | ORAL | 1 refills | Status: DC

## 2016-05-28 NOTE — Progress Notes (Signed)
Assessment and plan:  1. Hyperlipidemia, mixed   2. Essential hypertension   3. Diabetes mellitus without complication- (diet controlled)   4. Patient's noncompliance with other medical treatment and regimen   5. concerns for memory loss   6. Obesity   7. h/o Vitamin D deficiency   8. Heavy alcohol use   9. Elevated liver enzymes due to alcoholism   10. Chronic obstructive pulmonary disease, unspecified COPD type ([place])   11. Hx of multiple concussions     Mixed hyperlipidemia  with baseline mildly Elevated liver enzymes:  --> Patient denies that he's ever had a problem with cholesterol meds in the past and endorses he's never been told to take them.   --  Atorvastatin started due to patient's high risk of atherosclerotic cardiovascular events, after risks\benefits discussed --Explained to patient with mildly elevated liver enzymes, he needs to stop drinking especially since he needs a cholesterol med which we are starting today.  -- Explained to patient importance of proper hydration or else myalgias can be prominent ---Patient understands this can cause his liver enzymes to go up slightly and we will need to recheck this in the near future- 6-8wks.  Told pt to make this appt   Hypertension Patient on metoprolol and amlodipine currently.    Blood pressure not at goal  He does not recall ever being on an ARB or ACE inhibitor.  He denies allergy to these medicines.   Losartan started after risks and benefits discussed with patient.   Advised to monitor home blood pressures and bring in log. Follow up in a couple weeks or sooner if there is questions or concerns.   Diabetes mellitus without complication- (diet controlled) Well-controlled currently with A1c 5.9  Recommend checking sugars and monitoring.    Urine microalbumin to creatinine ratio within normal limits  GFR-normal  I already referred him to Optho  for diabetic eye exam prior, and nutritional consult placed this office visit at PT request.  He will follow-up with me in future for physical and yearly diabetic foot exam    h/o Vitamin D deficiency remains low due to noncompliance  He has a prescription for weekly vitamin D as well as 5000 IUs daily is what I recommend  Recheck 6 months  Patient's noncompliance with other medical treatment and regimen Educated patient about risks associated with noncompliance  OSA (obstructive sleep apnea) Unable to tolerate CPAP mask.    Advised weight loss and lifestyle modifications.  Patient will follow-up with Dr. Elsworth Soho  Obesity Education done; advised lifestyle modification  Immunocompromised state ([place]) Explained to patient that he cannot physically feel well or be mentally sharp/ feel mentally well with such poor dietary and lifestyle habits.     -Long discussion with patient re: improving his health and being more compliant with treatments.  concerns for memory loss -Health education done regarding memory with heavy alcohol use- explained to patient he will have memory issues.   -Recommend abstinence.   -Dementia panel normal.    -Patient declines further testing or workup at this time.  Hx of multiple concussions -Can be contributing to patient's mental state and possible memory issues.  -Patient declines further workup at this time.    Patient's Medications  New Prescriptions   ATORVASTATIN (LIPITOR) 40 MG TABLET    One tab q hs   CHOLECALCIFEROL (VITAMIN D3) 5000 UNITS TABS    5,000 IU OTC vitamin D3 daily.   CLINDAMYCIN (CLEOCIN) 300  MG CAPSULE    Take 1 capsule (300 mg total) by mouth 4 (four) times daily. X 7 days   LOSARTAN (COZAAR) 50 MG TABLET    Take one tab q am for BP  Previous Medications   ALBUTEROL (PROVENTIL HFA;VENTOLIN HFA) 108 (90 BASE) MCG/ACT INHALER    Inhale 2 puffs into the lungs every 6 (six) hours as needed for wheezing or shortness of breath.    ALPRAZOLAM (XANAX) 1 MG TABLET    Take 0.5 tablets (0.5 mg total) by mouth 3 (three) times daily as needed for anxiety.   AMLODIPINE (NORVASC) 10 MG TABLET    Take 1 tablet (10 mg total) by mouth daily.   BACLOFEN (LIORESAL) 10 MG TABLET    Take 1 tablet (10 mg total) by mouth 2 (two) times daily.   BREO ELLIPTA 100-25 MCG/INH AEPB    INHALE 1 PUFF BY MOUTH EVERY MORNING   DEXLANSOPRAZOLE (DEXILANT) 60 MG CAPSULE    Take 1 capsule (60 mg total) by mouth daily.   EPINEPHRINE 0.3 MG/0.3 ML IJ SOAJ INJECTION    Inject 0.3 mLs (0.3 mg total) into the muscle once.   FLUTICASONE (FLONASE) 50 MCG/ACT NASAL SPRAY    Place 2 sprays into both nostrils daily.   GABAPENTIN (NEURONTIN) 300 MG CAPSULE    Take 1 capsule (300 mg total) by mouth 3 (three) times daily as needed.   GUAIFENESIN (MUCINEX) 600 MG 12 HR TABLET    Take 1 tablet (600 mg total) by mouth 2 (two) times daily.   IPRATROPIUM-ALBUTEROL (DUONEB) 0.5-2.5 (3) MG/3ML SOLN    Take 3 mLs by nebulization every 4 (four) hours as needed (shortness of breath and wheezing).   LORATADINE (CLARITIN) 10 MG TABLET    Take 10 mg by mouth daily as needed for allergies. Reported on 01/25/2016   MECLIZINE (ANTIVERT) 25 MG TABLET    Take 25 mg by mouth 3 (three) times daily as needed for dizziness.   METOPROLOL SUCCINATE (TOPROL-XL) 25 MG 24 HR TABLET    Take 1 tablet (25 mg total) by mouth daily.   MONTELUKAST (SINGULAIR) 10 MG TABLET    TAKE 1 TABLET (10 MG TOTAL) BY MOUTH AT BEDTIME.   NITROGLYCERIN (NITROSTAT) 0.4 MG SL TABLET    Place 1 tablet (0.4 mg total) under the tongue every 5 (five) minutes as needed for chest pain.   ONDANSETRON (ZOFRAN) 8 MG TABLET    Take 1 tablet by mouth daily as needed.   OXYGEN    Inhale 2 L into the lungs at bedtime. Reported on 11/25/2015   SODIUM CHLORIDE (OCEAN) 0.65 % SOLN NASAL SPRAY    Place 1 spray into both nostrils as needed for congestion.   TRAMADOL (ULTRAM-ER) 300 MG 24 HR TABLET    Take 300 mg by mouth daily.    VITAMIN D, ERGOCALCIFEROL, (DRISDOL) 50000 UNITS CAPS CAPSULE    Take 50,000 Units by mouth every 7 (seven) days.  Modified Medications   No medications on file  Discontinued Medications   CETIRIZINE (ZYRTEC) 10 MG TABLET    Take 10 mg by mouth daily.   HYDROCODONE-ACETAMINOPHEN (NORCO/VICODIN) 5-325 MG TABLET    Take 1-2 tablets by mouth every 6 (six) hours as needed for moderate pain.   HYDROXYZINE (ATARAX/VISTARIL) 25 MG TABLET    Take 1 tablet by mouth every 8 (eight) hours as needed.    Return in about 4 weeks (around 06/25/2016) for losartan and lipitor started, vit D 3 also.  Anticipatory guidance and routine counseling done re: condition, txmnt options and need for follow up. All questions of patient's were answered.   Gross side effects, risk and benefits, and alternatives of medications discussed with patient.  Patient is aware that all medications have potential side effects and we are unable to predict every sideeffect or drug-drug interaction that may occur.  Expresses verbal understanding and consents to current therapy plan and treatment regiment.  Please see AVS handed out to patient at the end of our visit for additional patient instructions/ counseling done pertaining to today's office visit.  Note: This document was prepared using Dragon voice recognition software and may include unintentional dictation errors.   ----------------------------------------------------------------------------------------------------------------------  Subjective:   CC: [name] is a 65 y.o. male with multiple medical issues who presents to [place] [facility] today for review of his recent labs.  Diet controlled DM:  Recent A1c well controlled- 5.9.  Not checking sugars. Drinking heavily. No exercise.  Denies peripheral neuropathy symptoms, visual changes, or new cardiopulmonary sx -  I referred him to ophthalmology for a diabetic eye exam,  as well as diabetic  nutritionist b/c he said he had no clue what was healthy to eat and what was not.   HTN:  Patient does not check blood pressure at home.  Patient is worried about his memory- yet continues to drink at least 18 beers per week still.  Many yr hx of even much heavier drinking.  He forgets where he puts things and his wife often gets mad at him.   We discussed this last office visit and obtained a dementia panel which we will review today.    Long history noncompliance- he will not follow treatment plans of his healthcare providers.      Pt seen by Cardiology, psychiatry, urology, dermatology, allergy\immunology as well as sees Pulmonology (also had sleep study), Gastroenterology, Sports Med and Orthopedic Surgery    Wt Readings from Last 3 Encounters:  06/02/16 224 lb (101.6 kg)  05/28/16 224 lb 14.4 oz (102 kg)  05/15/16 223 lb (101.2 kg)   BP Readings from Last 3 Encounters:  06/02/16 136/78  05/28/16 (!) 141/76  05/15/16 132/80   Pulse Readings from Last 3 Encounters:  06/02/16 76  05/28/16 65  05/15/16 (!) 54      Full medical history updated and reviewed in the office today  Patient Active Problem List   Diagnosis Date Noted  . Patient's noncompliance with other medical treatment and regimen 06/02/2016  . Hyperlipidemia, mixed 06/02/2016  . Elevated liver enzymes  06/02/2016  . Schizophrenia ([place]) 04/30/2016  . Environmental and seasonal allergies 04/30/2016  . Diabetes mellitus without complication- (diet controlled) 04/30/2016  . Heavy alcohol use 04/30/2016  . Basal cell carcinoma of back 04/30/2016  . Obesity 04/30/2016  . Hx of multiple concussions 04/30/2016  . h/o Vitamin D deficiency 04/30/2016  . Screen for sexually transmitted diseases 04/30/2016  . Immunocompromised state ([place]) 04/30/2016  . Cervical radiculopathy 01/15/2016  . Alcohol withdrawal ([place]) 11/22/2015  . h/o Suicidal thoughts 11/21/2015  . Dry mouth 10/07/2015  . Anxiety 09/19/2015  . OAB  (overactive bladder) 07/27/2015  . Chronic idiopathic constipation 05/08/2015  . Screening for colon cancer   . Benign neoplasm of cecum   . Benign neoplasm of ascending colon   . Benign neoplasm of transverse colon   . Benign neoplasm of sigmoid colon   . Rectal polyp   . Gastroesophageal reflux disease  without esophagitis   . Esophageal stricture   . Joint stiffness of multiple sites 10/17/2014  . Benign localized hyperplasia of prostate with urinary obstruction 09/29/2014  . concerns for memory loss 09/13/2014  . Tuberculosis   . Diverticulum of bladder 06/17/2014  . Neuropathy ([place]) 04/11/2014  . Post-polio syndrome 01/13/2014  . COPD (chronic obstructive pulmonary disease) Gold C Frequent exacerbations 01/13/2014  . Hypertension 01/13/2014  . Former smoker 01/13/2014  . h/o Prostate cancer 01/13/2014  . Bipolar disorder ([place]) 01/13/2014  . Explosive personality disorder 01/13/2014  . Erectile dysfunction 01/13/2014  . Nephrolithiasis 01/13/2014  . Chronic pain 01/13/2014  . Alcoholism ([place]) 01/13/2014  . OSA (obstructive sleep apnea) 01/13/2014    Past Medical History:  Diagnosis Date  . Anxiety   . Anxiety disorder   . Arthritis    BILATERAL SHOULDERS, ELBOWS AND HANDS AND LEFT HIP AND KNEES--HX CORTISONE SHOTS IN SHOULDERS, ELBOWS, HIP AND KNEES  . Basal cell carcinoma of back 04/30/2016   Dermatologist- Dr Nevada Crane;   MOHS sx- Dr Levada Dy   . Bladder outlet obstruction   . Chronic idiopathic constipation 05/08/2015  . Complication of anesthesia    DIFFICULT WAKING   . COPD (chronic obstructive pulmonary disease) Gold C Frequent exacerbations 01/13/2014   [name] 07/08/14: FeV1 51% FeV1/FVC 66% FVC 59% 10/5/2015ONO RA was normal  10/13/2014  ONO on RA NORMAL   . COPD, frequent exacerbations ([place])    pulmologist-  dr Joya Gaskins--  [place] Rm Stage C.04-25-15 recent COPD exacerbation-much improved now, after tx. in ER [facility].  Marland Kitchen Depression   . Diabetes mellitus without complication  ([place])    BODERLINE - DIET CONTROL  . Dysrhythmia    PVC'S  . Family history of adverse reaction to anesthesia    father would wake up with agitation   . Former smoker 01/13/2014  . Gastroesophageal reflux disease without esophagitis   . GERD (gastroesophageal reflux disease)   . Heavy alcohol use 04/30/2016  . History of chronic bronchitis   . History of oxygen administration    oxygen use 2 l/m nasally at bedtime and exertional occasions  . History of rheumatic fever   . History of TB (tuberculosis)    1984--  hospitalized for 4 month treatment  . History of urinary retention   . Hx of multiple concussions    x 2 per patient   . Hypertension   . Melanoma ([place])   . Nocturnal oxygen desaturation    USES O2 NIGHTLY  . OSA (obstructive sleep apnea) 01/13/2014  . Pneumonia    hx of   . PONV (postoperative nausea and vomiting)   . Post-polio syndrome    polio at age 56--PT WAS IN IRON LUNG; PT WAS IN W/C UNTIL AGE 72; STILL HAS WEAKNESS RIGHT SIDE  . Prostate cancer ([place])   . Prostate cancer ([place])   . Schizophrenia ([place])   . Shortness of breath dyspnea    RIGHT HEMIDIAPHRAGM ELEVATION - POST POLIO SYNDROME  . Tuberculosis    Hosp 4 months rx , left early     Past Surgical History:  Procedure Laterality Date  . CARDIOVASCULAR STRESS TEST  06-08-2014  dr Mare Ferrari   normal lexiscan study/  no ischemia/  not gated due to PAC's  . COLONOSCOPY N/A 05/03/2015   Procedure: COLONOSCOPY;  Surgeon: [name], MD;  Location: [facility];  Service: Endoscopy;  Laterality: N/A;  . CYSTOSCOPY N/A 10/25/2015   Procedure: CYSTOSCOPY;  Surgeon: [name], MD;  Location:  [facility];  Service: Urology;  Laterality: N/A;  . CYSTOSCOPY W/ CYSTOGRAM/  TRANSRECTAL ULTRASOUND PROSTATE BX  03-22-2009  . ESOPHAGOGASTRODUODENOSCOPY N/A 03/22/2015   Procedure: ESOPHAGOGASTRODUODENOSCOPY (EGD) with dilation;  Surgeon: [name], MD;  Location: [facility];  Service: Endoscopy;  Laterality: N/A;  . excision  of skin lesion    . LAPAROSCOPIC CHOLECYSTECTOMY  2013  . left elbow surgery      due to fracture   . NASAL SEPTUM SURGERY  2000  . OTHER SURGICAL HISTORY      Muscle & bone Graft/Polio  . polio surgeries      14 polio surgeries   . PROSTATE BIOPSY N/A 09/28/2014   Procedure: PROSTATE ULTRASOUND/BIOPSY;  Surgeon: [name], MD;  Location: [facility];  Service: Urology;  Laterality: N/A;  . PROSTATE BIOPSY N/A 10/25/2015   Procedure: PROSTATE BIOPSY AND ULTRASOUND;  Surgeon: [name], MD;  Location: [facility];  Service: Urology;  Laterality: N/A;  . SAVORY DILATION N/A 03/22/2015   Procedure: SAVORY DILATION;  Surgeon: [name], MD;  Location: [facility];  Service: Endoscopy;  Laterality: N/A;  . SHOULDER ARTHROSCOPY WITH OPEN ROTATOR CUFF REPAIR Bilateral 2013  &  1999   removal spurs and bursectomy  . TRANSURETHRAL INCISION OF BLADDER NECK N/A 10/25/2015   Procedure:  TRANSURETHRAL INCISION OF BLADDER NECK;  Surgeon: [name], MD;  Location: [facility];  Service: Urology;  Laterality: N/A;  . TRANSURETHRAL RESECTION OF PROSTATE N/A 09/28/2014   Procedure: TRANSURETHRAL RESECTION OF THE PROSTATE (TURP);  Surgeon: [name], MD;  Location: [facility];  Service: Urology;  Laterality: N/A;  . URETEROSOPY STONE EXTRACTION  2000    Social History  Substance Use Topics  . Smoking status: Former Smoker    Years: 47.00    Quit date: 08/25/2013  . Smokeless tobacco: Former Systems developer  . Alcohol use 3.6 oz/week    6 Cans of beer per week     Comment: daily    family history includes Alcohol abuse in his sister and sister; Alzheimer's disease in his paternal aunt and paternal uncle; Alzheimer's disease (age of onset: 79) in his father; Cancer in his mother; Diabetes in his maternal aunt and maternal uncle; Heart attack in his father; Heart disease in his father; Mental illness in his sister; Skin cancer in his mother; Stomach cancer in his father; Thyroid disease in his maternal aunt; Tuberculosis in  his paternal grandfather and paternal grandmother.   Medications: Current Outpatient Prescriptions  Medication Sig Dispense Refill  . albuterol (PROVENTIL HFA;VENTOLIN HFA) 108 (90 Base) MCG/ACT inhaler Inhale 2 puffs into the lungs every 6 (six) hours as needed for wheezing or shortness of breath. 3 Inhaler 3  . ALPRAZolam (XANAX) 1 MG tablet Take 0.5 tablets (0.5 mg total) by mouth 3 (three) times daily as needed for anxiety. 90 tablet 0  . amLODipine (NORVASC) 10 MG tablet Take 1 tablet (10 mg total) by mouth daily. 90 tablet 1  . baclofen (LIORESAL) 10 MG tablet Take 1 tablet (10 mg total) by mouth 2 (two) times daily. 60 each 0  . BREO ELLIPTA 100-25 MCG/INH AEPB INHALE 1 PUFF BY MOUTH EVERY MORNING 180 each 0  . dexlansoprazole (DEXILANT) 60 MG capsule Take 1 capsule (60 mg total) by mouth daily. 90 capsule 3  . EPINEPHrine 0.3 mg/0.3 mL IJ SOAJ injection Inject 0.3 mLs (0.3 mg total) into the muscle once. 1 Device 1  . fluticasone (FLONASE) 50 MCG/ACT nasal spray Place  2 sprays into both nostrils daily. 16 g 6  . gabapentin (NEURONTIN) 300 MG capsule Take 1 capsule (300 mg total) by mouth 3 (three) times daily as needed. 90 capsule 1  . guaiFENesin (MUCINEX) 600 MG 12 hr tablet Take 1 tablet (600 mg total) by mouth 2 (two) times daily. 15 tablet 0  . ipratropium-albuterol (DUONEB) 0.5-2.5 (3) MG/3ML SOLN Take 3 mLs by nebulization every 4 (four) hours as needed (shortness of breath and wheezing).    Marland Kitchen loratadine (CLARITIN) 10 MG tablet Take 10 mg by mouth daily as needed for allergies. Reported on 01/25/2016  5  . meclizine (ANTIVERT) 25 MG tablet Take 25 mg by mouth 3 (three) times daily as needed for dizziness.    . metoprolol succinate (TOPROL-XL) 25 MG 24 hr tablet Take 1 tablet (25 mg total) by mouth daily. 90 tablet 1  . montelukast (SINGULAIR) 10 MG tablet TAKE 1 TABLET (10 MG TOTAL) BY MOUTH AT BEDTIME. 90 tablet 1  . nitroGLYCERIN (NITROSTAT) 0.4 MG SL tablet Place 1 tablet (0.4  mg total) under the tongue every 5 (five) minutes as needed for chest pain. 30 tablet 3  . ondansetron (ZOFRAN) 8 MG tablet Take 1 tablet by mouth daily as needed.    . OXYGEN Inhale 2 L into the lungs at bedtime. Reported on 11/25/2015    . sodium chloride (OCEAN) 0.65 % SOLN nasal spray Place 1 spray into both nostrils as needed for congestion.    . Vitamin D, Ergocalciferol, (DRISDOL) 50000 units CAPS capsule Take 50,000 Units by mouth every 7 (seven) days.    Marland Kitchen atorvastatin (LIPITOR) 40 MG tablet One tab q hs 90 tablet 1  . Cholecalciferol (VITAMIN D3) 5000 units TABS 5,000 IU OTC vitamin D3 daily. 90 tablet 3  . clindamycin (CLEOCIN) 300 MG capsule Take 1 capsule (300 mg total) by mouth 4 (four) times daily. X 7 days 28 capsule 0  . losartan (COZAAR) 50 MG tablet Take one tab q am for BP 90 tablet 1  . traMADol (ULTRAM-ER) 300 MG 24 hr tablet Take 300 mg by mouth daily.     No current facility-administered medications for this visit.     Allergies:  Allergies  Allergen Reactions  . Ivp Dye [Iodinated Diagnostic Agents] Anaphylaxis  . Levaquin [Levofloxacin In D5w] Shortness Of Breath and Swelling    In addition: sweating, chest pain, and diarrhea.   . Penicillins Anaphylaxis    Heart stops Has patient had a PCN reaction causing immediate rash, facial/tongue/throat swelling, SOB or lightheadedness with hypotension: yes Has patient had a PCN reaction causing severe rash involving mucus membranes or skin necrosis: yes Has patient had a PCN reaction that required hospitalization yes Has patient had a PCN reaction occurring within the last 10 years: No If all of the above answers are "NO", then may proceed with Cephalosporin use.   . Aspirin Other (See Comments)    Reaction unknown  . Nsaids Other (See Comments)    Difficulty breathing  . Tolmetin Other (See Comments)    Difficulty breathing  . Buprenorphine Hcl Nausea And Vomiting    Can take with zofran   . Morphine And Related  Nausea And Vomiting    Can take with zofran   . Oxycodone Itching and Rash     ROS:  Const:    no fevers, chills Eyes:    conjunctiva clear, no vision changes or blurred vision ENT:  no hearing difficulties, no dysphagia, no dysphonia, no  nose bleeds CV:   no chest pain, arrhythmias, no orthopnea, no PND Pulm:   no SOB at rest or exertion, no Wheeze, no DIB, no hemoptysis GI:    no N/V/D/C, no abd pain GU:   no blood in urine or inc freq or urgency Heme/Onc:    no unexplained bleeding, no night sweats, no more fatigue than usual Neuro:   No dizziness, no LOC, No unexplained weakness or numbness Endo:   no unexplained wt loss or gain M-Sk:   no localized myalgias or arthralgias Psych:    No SI/HI, no memory prob or unexplained confusion    Objective:  Blood pressure 141/76, pulse 73, weight 224 lb 14.4 oz (102 kg). Body mass index is 31.37 kg/m. Gen: Well NAD, A and O *3 HEENT: /AT, EOMI,  MMM, OP- clr Lungs: Normal work of breathing.  Distant BS, coarse B/L, no Wh Heart: RRR, S1, S2 WNL's, no MRG Abd: Soft. No gross distention Exts: warm, pink,  Brisk capillary refill, warm and well perfused.    Recent Results (from the past 2160 hour(s))  POCT HgB A1C     Status: Normal   Collection Time: 04/30/16  2:20 PM  Result Value Ref Range   Hemoglobin A1C 5.9   Microalbumin / creatinine urine ratio     Status: None   Collection Time: 04/30/16  2:21 PM  Result Value Ref Range   Creatinine, Urine 285 20 - 370 mg/dL   Microalb, Ur 1.6 Not estab mg/dL   Microalb Creat Ratio 6 <30 mcg/mg creat    Comment: The ADA has defined abnormalities in albumin excretion as follows:           Category           Result                            (mcg/mg creatinine)                 Normal:    <30       Microalbuminuria:    30 - 299   Clinical albuminuria:    > or = 300   The ADA recommends that at least two of three specimens collected within a 3 - 6 month period be abnormal  before considering a patient to be within a diagnostic category.     COMPLETE METABOLIC PANEL WITH GFR     Status: Abnormal   Collection Time: 05/07/16 10:24 AM  Result Value Ref Range   Sodium 139 135 - 146 mmol/L   Potassium 5.1 3.5 - 5.3 mmol/L   Chloride 103 98 - 110 mmol/L   CO2 26 20 - 31 mmol/L   Glucose, Bld 113 (H) 65 - 99 mg/dL   BUN 12 7 - 25 mg/dL   Creat 1.22 0.70 - 1.25 mg/dL    Comment:   For patients > or = 66 years of age: The upper reference limit for Creatinine is approximately 13% higher for people identified as African-American.      Total Bilirubin 0.4 0.2 - 1.2 mg/dL   Alkaline Phosphatase 53 40 - 115 U/L   AST 42 (H) 10 - 35 U/L   ALT 59 (H) 9 - 46 U/L   Total Protein 6.2 6.1 - 8.1 g/dL   Albumin 4.0 3.6 - 5.1 g/dL   Calcium 8.9 8.6 - 10.3 mg/dL   GFR, Est African American 71 >=60 mL/min  GFR, Est Non African American 61 >=60 mL/min  Lipid panel     Status: Abnormal   Collection Time: 05/07/16 10:24 AM  Result Value Ref Range   Cholesterol 241 (H) 125 - 200 mg/dL   Triglycerides 162 (H) <150 mg/dL   HDL 59 >=40 mg/dL   Total CHOL/HDL Ratio 4.1 <=5.0 Ratio   VLDL 32 (H) <30 mg/dL   LDL Cholesterol 150 (H) <130 mg/dL    Comment:   Total Cholesterol/HDL Ratio:CHD Risk                        Coronary Heart Disease Risk Table                                        Men       Women          1/2 Average Risk              3.4        3.3              Average Risk              5.0        4.4           2X Average Risk              9.6        7.1           3X Average Risk             23.4       11.0 Use the calculated Patient Ratio above and the CHD Risk table  to determine the patient's CHD Risk.   Magnesium     Status: None   Collection Time: 05/07/16 10:24 AM  Result Value Ref Range   Magnesium 1.9 1.5 - 2.5 mg/dL  Phosphorus     Status: None   Collection Time: 05/07/16 10:24 AM  Result Value Ref Range   Phosphorus 3.0 2.1 - 4.3 mg/dL  RPR      Status: None   Collection Time: 05/07/16 10:24 AM  Result Value Ref Range   RPR Ser Ql NON REAC NON REAC  Vitamin B12     Status: None   Collection Time: 05/07/16 10:24 AM  Result Value Ref Range   Vitamin B-12 547 200 - 1,100 pg/mL  Sedimentation rate     Status: None   Collection Time: 05/07/16 10:24 AM  Result Value Ref Range   Sed Rate 4 0 - 20 mm/hr  CBC     Status: None   Collection Time: 05/07/16 10:24 AM  Result Value Ref Range   WBC 5.4 3.8 - 10.8 K/uL   RBC 4.43 4.20 - 5.80 MIL/uL   Hemoglobin 14.3 13.2 - 17.1 g/dL   HCT 42.2 38.5 - 50.0 %   MCV 95.3 80.0 - 100.0 fL   MCH 32.3 27.0 - 33.0 pg   MCHC 33.9 32.0 - 36.0 g/dL   RDW 13.3 11.0 - 15.0 %   Platelets 234 140 - 400 K/uL   MPV 10.8 7.5 - 12.5 fL    Comment: ** Please note change in unit of measure and reference range(s). **  TSH     Status: None   Collection Time: 05/07/16 10:24 AM  Result Value Ref Range  TSH 2.00 0.40 - 4.50 mIU/L  Folate     Status: None   Collection Time: 05/07/16 10:24 AM  Result Value Ref Range   Folate 20.1 >5.4 ng/mL    Comment: Reference Range >17 years:   Low: <3.4 ng/mL              Borderline: 3.4-5.4 ng/mL              Normal: >5.4 ng/mL     VITAMIN D 25 Hydroxy (Vit-D Deficiency, Fractures)     Status: Abnormal   Collection Time: 05/07/16 10:24 AM  Result Value Ref Range   Vit D, 25-Hydroxy 24 (L) 30 - 100 ng/mL    Comment: Vitamin D Status           25-OH Vitamin D        Deficiency                <20 ng/mL        Insufficiency         20 - 29 ng/mL        Optimal             > or = 30 ng/mL   For 25-OH Vitamin D testing on patients on D2-supplementation and patients for whom quantitation of D2 and D3 fractions is required, the [facility] 25-OH VIT D, (D2,D3), LC/MS/MS is recommended: order code [id number] (patients > 2 yrs).   CBC with Differential     Status: None   Collection Time: 06/02/16 12:59 AM  Result Value Ref Range   WBC 6.4 4.0 - 10.5 K/uL   RBC 4.39  4.22 - 5.81 MIL/uL   Hemoglobin 14.3 13.0 - 17.0 g/dL   HCT 41.2 39.0 - 52.0 %   MCV 93.8 78.0 - 100.0 fL   MCH 32.6 26.0 - 34.0 pg   MCHC 34.7 30.0 - 36.0 g/dL   RDW 12.1 11.5 - 15.5 %   Platelets 231 150 - 400 K/uL   Neutrophils Relative % 52 %   Neutro Abs 3.4 1.7 - 7.7 K/uL   Lymphocytes Relative 31 %   Lymphs Abs 2.0 0.7 - 4.0 K/uL   Monocytes Relative 8 %   Monocytes Absolute 0.5 0.1 - 1.0 K/uL   Eosinophils Relative 8 %   Eosinophils Absolute 0.5 0.0 - 0.7 K/uL   Basophils Relative 1 %   Basophils Absolute 0.0 0.0 - 0.1 K/uL  Basic metabolic panel     Status: Abnormal   Collection Time: 06/02/16 12:59 AM  Result Value Ref Range   Sodium 136 135 - 145 mmol/L   Potassium 4.2 3.5 - 5.1 mmol/L   Chloride 104 101 - 111 mmol/L   CO2 25 22 - 32 mmol/L   Glucose, Bld 141 (H) 65 - 99 mg/dL   BUN 16 6 - 20 mg/dL   Creatinine, Ser 1.10 0.61 - 1.24 mg/dL   Calcium 9.0 8.9 - 10.3 mg/dL   GFR calc non Af Amer >60 >60 mL/min   GFR calc Af Amer >60 >60 mL/min    Comment: (NOTE) The eGFR has been calculated using the CKD EPI equation. This calculation has not been validated in all clinical situations. eGFR's persistently <60 mL/min signify possible Chronic Kidney Disease.    Anion gap 7 5 - 15

## 2016-05-28 NOTE — Telephone Encounter (Signed)
Pt returned call. Informed him of TP's recs. He voiced understanding and had no further questions. Nothing further needed.

## 2016-05-28 NOTE — Patient Instructions (Addendum)
- take your vitamin D 5000 IUs daily. Pt states he can't remember the wkly at end of OV with me.    I started a cholesterol medication for your cholesterol, atorvastatin and losartan for your elevated blood pressure please start them both and let me know if you have any problems with them.  Pt to call his GI doc re: Pt states he was suppose to have a f/up EGD and was lost to follow-up.  Explained to patient with mildly elevated liver enzymes, he needs to stop drinking especially since he needs a cholesterol med for his cholesterol which we are starting today. Patient understands this can cause his liver enzymes to go up slightly and we will need to recheck this in the near future  Patient mentions that end of office visit when we are walking out of the room that he coughed up blood.  Scant amnt.  Not freq but has done this in past.  When asked if he has been evaluated for this by his Pulm doc or if they are aware that this phenomenon may occur from time to time- patient thinks they are.   Explained to patient he will need further testing/ eval if this continues or becomes more frequent.  Red flag sx d/c pt     Guidelines for Losing Weight  Since food equals calories, in order to lose weight you must either eat fewer calories, exercise more to burn off calories with activity, or both. Food that is not used to fuel the body is stored as fat.  A major component of losing weight is to make smarter food choices. Here's how:  1)   Limit non-nutritious foods, such as: Sugar, honey, syrups and candy Pastries, donuts, pies, cakes and cookies Soft drinks, sweetened juices and alcoholic beverages  2)  Cut down on high-fat foods by: - Choosing poultry, fish or lean red meat - Choosing low-fat cooking methods, such as baking, broiling, steaming, grilling and boiling - Using low-fat or non-fat dairy products - Using vinaigrette, herbs, lemon or fat-free salad dressings - Avoiding fatty meats, such as  bacon, sausage, franks, ribs and luncheon meats - Avoiding high-fat snacks like nuts, chips and chocolate - Avoiding fried foods - Using less butter, margarine, oil and mayonnaise - Avoiding high-fat gravies, cream sauces and cream-based soups  3) Eat a variety of foods, including: - Fruit and vegetables that are raw, steamed or baked - Whole grains, breads, cereal, rice and pasta - Dairy products, such as low-fat or non-fat milk or yogurt, low-fat cottage cheese and low-fat cheese - Protein-rich foods like chicken, [place], fish, lean meat and legumes, or beans  4) Change your eating habits by: - Eat three balanced meals a day to help control your hunger - Watch portion sizes and eat small servings of a variety of foods - Choose low-calorie snacks - Eat only when you are hungry and stop when you are satisfied - Eat slowly and try not to perform other tasks while eating - Find other activities to distract you from food, such as walking, taking up a hobby or being involved in the community - Include regular exercise in your daily routine ( minimum of 20 min of moderate-intensity exercise at least 5 days/week)  - Find a support group, if necessary, for emotional support in your weight loss effort       Diabetes and Exercise Exercising regularly is important. It is not just about losing weight. It has many health benefits, such as:  Improving your overall fitness, flexibility, and endurance.  Increasing your bone density.  Helping with weight control.  Decreasing your body fat.  Increasing your muscle strength.  Reducing stress and tension.  Improving your overall health. People with diabetes who exercise gain additional benefits because exercise:  Reduces appetite.  Improves the body's use of blood sugar (glucose).  Helps lower or control blood glucose.  Decreases blood pressure.  Helps control blood lipids (such as cholesterol and triglycerides).  Improves the  body's use of the hormone insulin by:  Increasing the body's insulin sensitivity.  Reducing the body's insulin needs.  Decreases the risk for heart disease because exercising:  Lowers cholesterol and triglycerides levels.  Increases the levels of good cholesterol (such as high-density lipoproteins [HDL]) in the body.  Lowers blood glucose levels. YOUR ACTIVITY PLAN  Choose an activity that you enjoy, and set realistic goals. To exercise safely, you should begin practicing any new physical activity slowly, and gradually increase the intensity of the exercise over time. Your health care provider or diabetes educator can help create an activity plan that works for you. General recommendations include:  Encouraging children to engage in at least 60 minutes of physical activity each day.  Stretching and performing strength training exercises, such as yoga or weight lifting, at least 2 times per week.  Performing a total of at least 150 minutes of moderate-intensity exercise each week, such as brisk walking or water aerobics.  Exercising at least 3 days per week, making sure you allow no more than 2 consecutive days to pass without exercising.  Avoiding long periods of inactivity (90 minutes or more). When you have to spend an extended period of time sitting down, take frequent breaks to walk or stretch. RECOMMENDATIONS FOR EXERCISING WITH TYPE 1 OR TYPE 2 DIABETES   Check your blood glucose before exercising. If blood glucose levels are greater than 240 mg/dL, check for urine ketones. Do not exercise if ketones are present.  Avoid injecting insulin into areas of the body that are going to be exercised. For example, avoid injecting insulin into:  The arms when playing tennis.  The legs when jogging.  Keep a record of:  Food intake before and after you exercise.  Expected peak times of insulin action.  Blood glucose levels before and after you exercise.  The type and amount of  exercise you have done.  Review your records with your health care provider. Your health care provider will help you to develop guidelines for adjusting food intake and insulin amounts before and after exercising.  If you take insulin or oral hypoglycemic agents, watch for signs and symptoms of hypoglycemia. They include:  Dizziness.  Shaking.  Sweating.  Chills.  Confusion.  Drink plenty of water while you exercise to prevent dehydration or heat stroke. Body water is lost during exercise and must be replaced.  Talk to your health care provider before starting an exercise program to make sure it is safe for you. Remember, almost any type of activity is better than none.   This information is not intended to replace advice given to you by your health care provider. Make sure you discuss any questions you have with your health care provider.   Document Released: 12/22/2003 Document Revised: 02/15/2015 Document Reviewed: 03/10/2013 Elsevier Interactive Patient Education [employer].

## 2016-05-30 ENCOUNTER — Other Ambulatory Visit: Payer: Self-pay | Admitting: Physician Assistant

## 2016-05-30 ENCOUNTER — Other Ambulatory Visit: Payer: Self-pay

## 2016-05-30 NOTE — Telephone Encounter (Signed)
[facility] faxed a refill request for amlodipine. This was last prescribed by a different provider.

## 2016-05-31 NOTE — Telephone Encounter (Signed)
8 weeks ago (04/03/2016)  amLODipine (NORVASC) 10 MG tablet  Take 1 tablet (10 mg total) by mouth daily.  Dispense: 90 tablet Refills: 1 Start: 04/03/2016 By: Brunetta Jeans, PA-C Encounter   Pt still has one month of his last prescription left and a refill of 90 days.    So no I won't refill it.   Please remind patient of his refill.  Pt will be due early Dec for RF and will be in the clinic to see me prior to that.

## 2016-06-01 ENCOUNTER — Encounter (INDEPENDENT_AMBULATORY_CARE_PROVIDER_SITE_OTHER): Payer: Self-pay | Admitting: Ophthalmology

## 2016-06-01 NOTE — Telephone Encounter (Signed)
Left message advising patient of recommendations.  °

## 2016-06-02 ENCOUNTER — Emergency Department (HOSPITAL_BASED_OUTPATIENT_CLINIC_OR_DEPARTMENT_OTHER): Payer: Medicare Other

## 2016-06-02 ENCOUNTER — Emergency Department (HOSPITAL_BASED_OUTPATIENT_CLINIC_OR_DEPARTMENT_OTHER)
Admission: EM | Admit: 2016-06-02 | Discharge: 2016-06-02 | Disposition: A | Discharge status: Home / Self Care | Payer: Medicare Other | Attending: Emergency Medicine | Admitting: Emergency Medicine

## 2016-06-02 ENCOUNTER — Encounter (HOSPITAL_BASED_OUTPATIENT_CLINIC_OR_DEPARTMENT_OTHER): Payer: Self-pay | Admitting: *Deleted

## 2016-06-02 DIAGNOSIS — J441 Chronic obstructive pulmonary disease with (acute) exacerbation: Secondary | ICD-10-CM | POA: Insufficient documentation

## 2016-06-02 DIAGNOSIS — R51 Headache: Secondary | ICD-10-CM | POA: Diagnosis not present

## 2016-06-02 DIAGNOSIS — Z87891 Personal history of nicotine dependence: Secondary | ICD-10-CM | POA: Insufficient documentation

## 2016-06-02 DIAGNOSIS — Z8546 Personal history of malignant neoplasm of prostate: Secondary | ICD-10-CM | POA: Diagnosis not present

## 2016-06-02 DIAGNOSIS — Z91199 Patient's noncompliance with other medical treatment and regimen due to unspecified reason: Secondary | ICD-10-CM | POA: Insufficient documentation

## 2016-06-02 DIAGNOSIS — Z85828 Personal history of other malignant neoplasm of skin: Secondary | ICD-10-CM | POA: Insufficient documentation

## 2016-06-02 DIAGNOSIS — I10 Essential (primary) hypertension: Secondary | ICD-10-CM | POA: Diagnosis not present

## 2016-06-02 DIAGNOSIS — E119 Type 2 diabetes mellitus without complications: Secondary | ICD-10-CM | POA: Insufficient documentation

## 2016-06-02 DIAGNOSIS — R748 Abnormal levels of other serum enzymes: Secondary | ICD-10-CM | POA: Insufficient documentation

## 2016-06-02 DIAGNOSIS — J0101 Acute recurrent maxillary sinusitis: Secondary | ICD-10-CM | POA: Insufficient documentation

## 2016-06-02 DIAGNOSIS — Z9119 Patient's noncompliance with other medical treatment and regimen: Secondary | ICD-10-CM | POA: Insufficient documentation

## 2016-06-02 DIAGNOSIS — E782 Mixed hyperlipidemia: Secondary | ICD-10-CM | POA: Insufficient documentation

## 2016-06-02 LAB — BASIC METABOLIC PANEL
Anion gap: 7 (ref 5–15)
BUN: 16 mg/dL (ref 6–20)
CHLORIDE: 104 mmol/L (ref 101–111)
CO2: 25 mmol/L (ref 22–32)
CREATININE: 1.1 mg/dL (ref 0.61–1.24)
Calcium: 9 mg/dL (ref 8.9–10.3)
GFR calc non Af Amer: >60 mL/min (ref >60)
Glucose, Bld: 141 mg/dL — ABNORMAL HIGH (ref 65–99)
POTASSIUM: 4.2 mmol/L (ref 3.5–5.1)
Sodium: 136 mmol/L (ref 135–145)

## 2016-06-02 LAB — CBC WITH DIFFERENTIAL/PLATELET
Basophils Absolute: 0 10*3/uL (ref 0.0–0.1)
Basophils Relative: 1 %
Eosinophils Absolute: 0.5 10*3/uL (ref 0.0–0.7)
Eosinophils Relative: 8 %
HCT: 41.2 % (ref 39.0–52.0)
HEMOGLOBIN: 14.3 g/dL (ref 13.0–17.0)
LYMPHS ABS: 2 10*3/uL (ref 0.7–4.0)
Lymphocytes Relative: 31 %
MCH: 32.6 pg (ref 26.0–34.0)
MCHC: 34.7 g/dL (ref 30.0–36.0)
MCV: 93.8 fL (ref 78.0–100.0)
MONOS PCT: 8 %
Monocytes Absolute: 0.5 10*3/uL (ref 0.1–1.0)
NEUTROS PCT: 52 %
Neutro Abs: 3.4 10*3/uL (ref 1.7–7.7)
Platelets: 231 10*3/uL (ref 150–400)
RBC: 4.39 MIL/uL (ref 4.22–5.81)
RDW: 12.1 % (ref 11.5–15.5)
WBC: 6.4 10*3/uL (ref 4.0–10.5)

## 2016-06-02 MED ORDER — DIPHENHYDRAMINE HCL 50 MG/ML IJ SOLN
25.0000 mg | Freq: Once | INTRAMUSCULAR | Status: AC
  Administered 2016-06-02: 25 mg via INTRAVENOUS
  Filled 2016-06-02: qty 1

## 2016-06-02 MED ORDER — SODIUM CHLORIDE 0.9 % IV BOLUS (SEPSIS)
1000.0000 mL | Freq: Once | INTRAVENOUS | Status: AC
Start: 2016-06-02 — End: 2016-06-02
  Administered 2016-06-02: 1000 mL via INTRAVENOUS

## 2016-06-02 MED ORDER — METHYLPREDNISOLONE SODIUM SUCC 125 MG IJ SOLR
125.0000 mg | Freq: Once | INTRAMUSCULAR | Status: AC
  Administered 2016-06-02: 125 mg via INTRAVENOUS
  Filled 2016-06-02: qty 2

## 2016-06-02 MED ORDER — CLINDAMYCIN HCL 150 MG PO CAPS
300.0000 mg | ORAL_CAPSULE | Freq: Once | ORAL | Status: AC
  Administered 2016-06-02: 300 mg via ORAL
  Filled 2016-06-02: qty 2

## 2016-06-02 MED ORDER — PROCHLORPERAZINE EDISYLATE 5 MG/ML IJ SOLN
10.0000 mg | Freq: Once | INTRAMUSCULAR | Status: AC
  Administered 2016-06-02: 10 mg via INTRAVENOUS
  Filled 2016-06-02: qty 2

## 2016-06-02 MED ORDER — CLINDAMYCIN HCL 300 MG PO CAPS: 300.0000 mg | ORAL_CAPSULE | Freq: Four times a day (QID) | ORAL | 0 refills | Status: DC

## 2016-06-02 NOTE — ED Triage Notes (Signed)
Pt c/o increased BP and h/a x 3 hrs  

## 2016-06-02 NOTE — Assessment & Plan Note (Signed)
Asked patient to please send me med records from his various specialists.  Pt understands importance of follow-thru w Derm

## 2016-06-02 NOTE — ED Notes (Signed)
Pt reports "being unable to sit still." EDP made aware. No new orders received.

## 2016-06-02 NOTE — ED Notes (Signed)
Pt still complaining of being hyper. MD Palumbo at bedside to discuss with patient.

## 2016-06-02 NOTE — ED Notes (Signed)
Pt transported to CT at this time.

## 2016-06-02 NOTE — Assessment & Plan Note (Signed)
Educated patient about risks associated with noncompliance

## 2016-06-02 NOTE — ED Notes (Signed)
EDP at bedside  

## 2016-06-02 NOTE — Assessment & Plan Note (Addendum)
Patient was referred to psychiatry in June 2017 by prior PCP Raiford Noble.  Patient understands he needs a psychiatrist to treat his psychiatric conditions and needs to f/up with them

## 2016-06-02 NOTE — Assessment & Plan Note (Signed)
Health education done regarding memory with heavy alcohol use- explained to patient he will have memory issues.  Recommend abstinence.  Dementia panel normal.   Patient declines further testing or workup at this time.

## 2016-06-02 NOTE — Assessment & Plan Note (Addendum)
Seen by Dr. Elsworth Soho, and his nurse practitioner every 4 months.  Patient noncompliant with CPAP machine due to mask.  They recently sent pt to pulmonary rehabilitation;   Stable; no acute complaints today.

## 2016-06-02 NOTE — Assessment & Plan Note (Addendum)
Recommend checking sugars and monitoring.    Urine microalbumin to creatinine ratio within normal limits  GFR-normal  I already referred him to Opth[facility] for diabetic eye exam prior, and nutritional consult placed this office visit at PT request.  He will follow-up with me in future for physical and yearly diabetic foot exam

## 2016-06-02 NOTE — Assessment & Plan Note (Signed)
Explained to patient that he cannot physically feel well or be mentally sharp/ feel mentally well with such poor dietary and lifestyle habits.    Long discussion with patient trying to motivate him to improve his health and be more compliant with treatments.

## 2016-06-02 NOTE — Assessment & Plan Note (Signed)
Unable to tolerate CPAP mask.    Advised weight loss and lifestyle modifications.  Patient will follow-up with Dr. Elsworth Soho

## 2016-06-02 NOTE — Assessment & Plan Note (Signed)
Patient on metoprolol and amlodipine currently.    Blood pressure not at goal  He does not recall ever being on an ARB or ACE inhibitor.  He denies allergy to these medicines.   Losartan started after risks and benefits discussed with patient.   Advised to monitor home blood pressures and bring in log. Follow up in a couple weeks or sooner if there is questions or concerns.

## 2016-06-02 NOTE — Assessment & Plan Note (Addendum)
Med management per psychiatry for patient's schizophrenia, bipolar and personality disorders;  Patient does not know who his psychiatrist is

## 2016-06-02 NOTE — ED Provider Notes (Signed)
[facility] Provider Note   CSN: [id number] Arrival date & time: 06/02/16  0009     History   Chief Complaint Chief Complaint  Patient presents with  . Hypertension    HPI  Blood pressure 136/78, pulse 76, temperature 98.2 F (36.8 C), temperature source Oral, resp. rate 16, height 5\' 11"  (1.803 m), weight 101.6 kg, SpO2 96 %.  [name] is a 65 y.o. male complaining of frontal headache, right flank pain, sensation of "burning up." Patient states that his blood pressure is elevated he has a dry mouth. Symptoms started around 7 PM tonight. Wife states that he's been working outside in the heat all week and thinks she is dehydrated. Patient denies actual fever, chills, nausea, vomiting, chest pain, shortness of breath, recent tick bites, change in vision, cervicalgia, dysarthria, ataxia. No pain medication taken for headache prior to arrival he rates it at 12 out of 10.   HPI  Past Medical History:  Diagnosis Date  . Anxiety   . Anxiety disorder   . Arthritis    BILATERAL SHOULDERS, ELBOWS AND HANDS AND LEFT HIP AND KNEES--HX CORTISONE SHOTS IN SHOULDERS, ELBOWS, HIP AND KNEES  . Basal cell carcinoma of back 04/30/2016   Dermatologist- Dr Nevada Crane;   MOHS sx- Dr Levada Dy   . Bladder outlet obstruction   . Chronic idiopathic constipation 05/08/2015  . Complication of anesthesia    DIFFICULT WAKING   . COPD (chronic obstructive pulmonary disease) Gold C Frequent exacerbations 01/13/2014   [name] 07/08/14: FeV1 51% FeV1/FVC 66% FVC 59% 10/5/2015ONO RA was normal  10/13/2014  ONO on RA NORMAL   . COPD, frequent exacerbations ([place])    pulmologist-  dr Joya Gaskins--  [place] Rm Stage C.04-25-15 recent COPD exacerbation-much improved now, after tx. in ER [facility].  Marland Kitchen Depression   . Diabetes mellitus without complication ([place])    BODERLINE - DIET CONTROL  . Dysrhythmia    PVC'S  . Family history of adverse reaction to anesthesia    father would wake up with agitation   . Former  smoker 01/13/2014  . Gastroesophageal reflux disease without esophagitis   . GERD (gastroesophageal reflux disease)   . Heavy alcohol use 04/30/2016  . History of chronic bronchitis   . History of oxygen administration    oxygen use 2 l/m nasally at bedtime and exertional occasions  . History of rheumatic fever   . History of TB (tuberculosis)    1984--  hospitalized for 4 month treatment  . History of urinary retention   . Hx of multiple concussions    x 2 per patient   . Hypertension   . Melanoma ([place])   . Nocturnal oxygen desaturation    USES O2 NIGHTLY  . OSA (obstructive sleep apnea) 01/13/2014  . Pneumonia    hx of   . PONV (postoperative nausea and vomiting)   . Post-polio syndrome    polio at age 45--PT WAS IN IRON LUNG; PT WAS IN W/C UNTIL AGE 24; STILL HAS WEAKNESS RIGHT SIDE  . Prostate cancer ([place])   . Prostate cancer ([place])   . Schizophrenia ([place])   . Shortness of breath dyspnea    RIGHT HEMIDIAPHRAGM ELEVATION - POST POLIO SYNDROME  . Tuberculosis    Hosp 4 months rx , left early     Patient Active Problem List   Diagnosis Date Noted  . Schizophrenia ([place]) 04/30/2016  . Environmental and seasonal allergies 04/30/2016  . Diabetes mellitus without complication- (diet controlled) 04/30/2016  .  Heavy alcohol use 04/30/2016  . Basal cell carcinoma of back 04/30/2016  . Obesity 04/30/2016  . Hx of multiple concussions 04/30/2016  . h/o Vitamin D deficiency 04/30/2016  . Screen for sexually transmitted diseases 04/30/2016  . Immunocompromised state ([place]) 04/30/2016  . Cervical radiculopathy 01/15/2016  . Alcohol withdrawal ([place]) 11/22/2015  . h/o Suicidal thoughts 11/21/2015  . Dry mouth 10/07/2015  . Anxiety 09/19/2015  . OAB (overactive bladder) 07/27/2015  . Chronic idiopathic constipation 05/08/2015  . Screening for colon cancer   . Benign neoplasm of cecum   . Benign neoplasm of ascending colon   . Benign neoplasm of transverse colon   . Benign neoplasm of  sigmoid colon   . Rectal polyp   . Gastroesophageal reflux disease without esophagitis   . Esophageal stricture   . Joint stiffness of multiple sites 10/17/2014  . Benign localized hyperplasia of prostate with urinary obstruction 09/29/2014  . concerns for memory loss 09/13/2014  . Tuberculosis   . Diverticulum of bladder 06/17/2014  . Neuropathy ([place]) 04/11/2014  . Post-polio syndrome 01/13/2014  . COPD (chronic obstructive pulmonary disease) Gold C Frequent exacerbations 01/13/2014  . Hypertension 01/13/2014  . Former smoker 01/13/2014  . h/o Prostate cancer 01/13/2014  . Bipolar disorder ([place]) 01/13/2014  . Explosive personality disorder 01/13/2014  . Erectile dysfunction 01/13/2014  . Nephrolithiasis 01/13/2014  . Chronic pain 01/13/2014  . Alcoholism ([place]) 01/13/2014  . OSA (obstructive sleep apnea) 01/13/2014    Past Surgical History:  Procedure Laterality Date  . CARDIOVASCULAR STRESS TEST  06-08-2014  dr Mare Ferrari   normal lexiscan study/  no ischemia/  not gated due to PAC's  . COLONOSCOPY N/A 05/03/2015   Procedure: COLONOSCOPY;  Surgeon: [name], MD;  Location: [facility];  Service: Endoscopy;  Laterality: N/A;  . CYSTOSCOPY N/A 10/25/2015   Procedure: CYSTOSCOPY;  Surgeon: [name], MD;  Location: [facility];  Service: Urology;  Laterality: N/A;  . CYSTOSCOPY W/ CYSTOGRAM/  TRANSRECTAL ULTRASOUND PROSTATE BX  03-22-2009  . ESOPHAGOGASTRODUODENOSCOPY N/A 03/22/2015   Procedure: ESOPHAGOGASTRODUODENOSCOPY (EGD) with dilation;  Surgeon: [name], MD;  Location: [facility];  Service: Endoscopy;  Laterality: N/A;  . excision of skin lesion    . LAPAROSCOPIC CHOLECYSTECTOMY  2013  . left elbow surgery      due to fracture   . NASAL SEPTUM SURGERY  2000  . OTHER SURGICAL HISTORY      Muscle & bone Graft/Polio  . polio surgeries      14 polio surgeries   . PROSTATE BIOPSY N/A 09/28/2014   Procedure: PROSTATE ULTRASOUND/BIOPSY;  Surgeon: [name], MD;   Location: [facility];  Service: Urology;  Laterality: N/A;  . PROSTATE BIOPSY N/A 10/25/2015   Procedure: PROSTATE BIOPSY AND ULTRASOUND;  Surgeon: [name], MD;  Location: [facility];  Service: Urology;  Laterality: N/A;  . SAVORY DILATION N/A 03/22/2015   Procedure: SAVORY DILATION;  Surgeon: [name], MD;  Location: [facility];  Service: Endoscopy;  Laterality: N/A;  . SHOULDER ARTHROSCOPY WITH OPEN ROTATOR CUFF REPAIR Bilateral 2013  &  1999   removal spurs and bursectomy  . TRANSURETHRAL INCISION OF BLADDER NECK N/A 10/25/2015   Procedure:  TRANSURETHRAL INCISION OF BLADDER NECK;  Surgeon: [name], MD;  Location: [facility];  Service: Urology;  Laterality: N/A;  . TRANSURETHRAL RESECTION OF PROSTATE N/A 09/28/2014   Procedure: TRANSURETHRAL RESECTION OF THE PROSTATE (TURP);  Surgeon: [name], MD;  Location: [facility];  Service: Urology;  Laterality: N/A;  . URETEROSOPY STONE EXTRACTION  2000       Home Medications    Prior to Admission medications   Medication Sig Start Date End Date Taking? Authorizing Provider  albuterol (PROVENTIL HFA;VENTOLIN HFA) 108 (90 Base) MCG/ACT inhaler Inhale 2 puffs into the lungs every 6 (six) hours as needed for wheezing or shortness of breath. 10/20/15   Brunetta Jeans, PA-C  ALPRAZolam [name]) 1 MG tablet Take 0.5 tablets (0.5 mg total) by mouth 3 (three) times daily as needed for anxiety. 04/11/16   Debbrah Alar, NP  amLODipine (NORVASC) 10 MG tablet Take 1 tablet (10 mg total) by mouth daily. 04/03/16   Brunetta Jeans, PA-C  atorvastatin (LIPITOR) 40 MG tablet One tab q hs 05/28/16   Deborah Opalski, DO  baclofen (LIORESAL) 10 MG tablet Take 1 tablet (10 mg total) by mouth 2 (two) times daily. 04/03/16   Brunetta Jeans, PA-C  BREO ELLIPTA 100-25 MCG/INH AEPB INHALE 1 PUFF BY MOUTH EVERY MORNING 03/18/16   Brunetta Jeans, PA-C  Cholecalciferol (VITAMIN D3) 5000 units TABS 5,000 IU OTC vitamin D3 daily. 05/28/16   [name], DO    dexlansoprazole (DEXILANT) 60 MG capsule Take 1 capsule (60 mg total) by mouth daily. 12/21/15   Brunetta Jeans, PA-C  EPINEPHrine 0.3 mg/0.3 mL IJ SOAJ injection Inject 0.3 mLs (0.3 mg total) into the muscle once. 02/24/16   Malvin Johns, MD  fluticasone (FLONASE) 50 MCG/ACT nasal spray Place 2 sprays into both nostrils daily. 08/03/15   Brunetta Jeans, PA-C  gabapentin (NEURONTIN) 300 MG capsule Take 1 capsule (300 mg total) by mouth 3 (three) times daily as needed. 04/02/16   Brunetta Jeans, PA-C  guaiFENesin (MUCINEX) 600 MG 12 hr tablet Take 1 tablet (600 mg total) by mouth 2 (two) times daily. 11/23/15   Geradine Girt, DO  ipratropium-albuterol (DUONEB) 0.5-2.5 (3) MG/3ML SOLN Take 3 mLs by nebulization every 4 (four) hours as needed (shortness of breath and wheezing).    Historical Provider, MD  loratadine (CLARITIN) 10 MG tablet Take 10 mg by mouth daily as needed for allergies. Reported on 01/25/2016 08/01/15   Historical Provider, MD  losartan (COZAAR) 50 MG tablet Take one tab q am for BP 05/28/16   Deborah Opalski, DO  meclizine (ANTIVERT) 25 MG tablet Take 25 mg by mouth 3 (three) times daily as needed for dizziness.    Historical Provider, MD  metoprolol succinate (TOPROL-XL) 25 MG 24 hr tablet Take 1 tablet (25 mg total) by mouth daily. 04/02/16   Brunetta Jeans, PA-C  montelukast (SINGULAIR) 10 MG tablet TAKE 1 TABLET (10 MG TOTAL) BY MOUTH AT BEDTIME. 04/02/16   Brunetta Jeans, PA-C  nitroGLYCERIN (NITROSTAT) 0.4 MG SL tablet Place 1 tablet (0.4 mg total) under the tongue every 5 (five) minutes as needed for chest pain. 06/21/15   Brunetta Jeans, PA-C  ondansetron (ZOFRAN) 8 MG tablet Take 1 tablet by mouth daily as needed. 02/21/16   Historical Provider, MD  OXYGEN Inhale 2 L into the lungs at bedtime. Reported on 11/25/2015    Historical Provider, MD  sodium chloride (OCEAN) 0.65 % SOLN nasal spray Place 1 spray into both nostrils as needed for congestion.    Historical Provider, MD   traMADol (ULTRAM-ER) 300 MG 24 hr tablet Take 300 mg by mouth daily.    Historical Provider, MD  Vitamin D, Ergocalciferol, (DRISDOL) 50000 units CAPS capsule Take 50,000  Units by mouth every 7 (seven) days.    Historical Provider, MD    Family History Family History  Problem Relation Age of Onset  . Alzheimer's disease Father 4    Deceased  . Stomach cancer Father   . Heart attack Father   . Heart disease Father   . Skin cancer Mother     Facial-Living  . Cancer Mother     skin  . Alcohol abuse Sister     x2  . Mental illness Sister     x2  . Alcohol abuse Sister   . Diabetes Maternal Aunt     x2  . Thyroid disease Maternal Aunt     x4  . Diabetes Maternal Uncle   . Tuberculosis Paternal Grandfather   . Tuberculosis Paternal Grandmother   . Alzheimer's disease Paternal Aunt   . Alzheimer's disease Paternal Uncle   . Colon cancer Neg Hx   . Colon polyps Neg Hx   . Crohn's disease Neg Hx   . Ulcerative colitis Neg Hx     Social History Social History  Substance Use Topics  . Smoking status: Former Smoker    Years: 47.00    Quit date: 08/25/2013  . Smokeless tobacco: Former Systems developer  . Alcohol use 3.6 oz/week    6 Cans of beer per week     Comment: daily     Allergies   Ivp dye [iodinated diagnostic agents]; Levaquin [levofloxacin in d5w]; Penicillins; Aspirin; Nsaids; Tolmetin; Buprenorphine hcl; Morphine and related; and Oxycodone   Review of Systems Review of Systems  10 systems reviewed and found to be negative, except as noted in the HPI.   Physical Exam Updated Vital Signs BP 136/78   Pulse 76   Temp 98.2 F (36.8 C) (Oral)   Resp 16   Ht 5\' 11"  (1.803 m)   Wt 101.6 kg   SpO2 96%   BMI 31.24 kg/m   Physical Exam  Constitutional: He is oriented to person, place, and time. He appears well-developed and well-nourished. No distress.  HENT:  Head: Normocephalic and atraumatic.  Mouth/Throat: Oropharynx is clear and moist.  Eyes: Conjunctivae  and EOM are normal. Pupils are equal, round, and reactive to light.  Neck: Normal range of motion.  FROM to C-spine. Pt can touch chin to chest without discomfort. No TTP of midline cervical spine.   Cardiovascular: Normal rate, regular rhythm and intact distal pulses.   Pulmonary/Chest: Effort normal and breath sounds normal.  Abdominal: Soft. There is no tenderness.  Musculoskeletal: Normal range of motion.  Neurological: He is alert and oriented to person, place, and time.  II-Visual fields grossly intact. III/IV/VI-Extraocular movements intact.  Pupils reactive bilaterally. V/VII-Smile symmetric, facial sensation intact VIII- Hearing grossly intact IX/X-Normal gag XI-bilateral shoulder shrug XII-midline tongue extension Motor: 5/5 bilaterally with normal tone and bulk Cerebellar: Normal finger-to-nose  and normal heel-to-shin test.   Ambulates with a coordinated gait   Skin: He is not diaphoretic.  Psychiatric: He has a normal mood and affect.  Nursing note and vitals reviewed.    ED Treatments / Results  Labs (all labs ordered are listed, but only abnormal results are displayed) Labs Reviewed - No data to display  EKG  EKG Interpretation None       Radiology No results found.  Procedures Procedures (including critical care time)  Medications Ordered in ED Medications - No data to display   Initial Impression / Assessment and Plan / ED Course  I have  reviewed the triage vital signs and the nursing notes.  Pertinent labs & imaging results that were available during my care of the patient were reviewed by me and considered in my medical decision making (see chart for details).  Clinical Course     Vitals:   06/02/16 0018 06/02/16 0019  BP:  136/78  Pulse:  76  Resp:  16  Temp:  98.2 F (36.8 C)  TempSrc:  Oral  SpO2:  96%  Weight: 101.6 kg   Height: 5\' 11"  (1.803 m)     Medications  sodium chloride 0.9 % bolus 1,000 mL (1,000 mLs Intravenous  New Bag/Given 06/02/16 0056)  prochlorperazine (COMPAZINE) injection 10 mg (10 mg Intravenous Given 06/02/16 0056)  methylPREDNISolone sodium succinate (SOLU-MEDROL) 125 mg/2 mL injection 125 mg (125 mg Intravenous Given 06/02/16 0056)  diphenhydrAMINE (BENADRYL) injection 25 mg (25 mg Intravenous Given 06/02/16 0056)    KHYAIRE MOGREN is 65 y.o. male presenting with frontal headache, normal neuro exam, no meningeal signs. Pt poor historian but wife states he is mentating at his baseline.  Case Signed out to Dr. Randal Buba at shift change: plan is to f/u head CT, bloodwork, EKG.     Final Clinical Impressions(s) / ED Diagnoses   Final diagnoses:  None    New Prescriptions New Prescriptions   No medications on file     Monico Blitz, PA-C 06/02/16 0109    April Palumbo, MD 06/02/16 0201    April Palumbo, MD 06/02/16 [phone]

## 2016-06-02 NOTE — Assessment & Plan Note (Signed)
Education done; advised lifestyle modification

## 2016-06-02 NOTE — Assessment & Plan Note (Signed)
remains low due to noncompliance  He has a prescription for weekly vitamin D as well as 5000 IUs daily is what I recommend  Recheck 6 months

## 2016-06-02 NOTE — Assessment & Plan Note (Signed)
Explained to patient with mildly elevated liver enzymes, he needs to stop drinking especially since he needs a cholesterol med which we are starting today.  Patient understands this can cause his liver enzymes to go up slightly and we will need to recheck this in the near future- 6-8wks.  Told pt to make this appt

## 2016-06-02 NOTE — Assessment & Plan Note (Signed)
Can be contributing to patient's mental state and possible memory issues.   Patient declines further workup at this time.

## 2016-06-04 ENCOUNTER — Ambulatory Visit (INDEPENDENT_AMBULATORY_CARE_PROVIDER_SITE_OTHER): Payer: Medicare Other | Admitting: Clinical

## 2016-06-04 DIAGNOSIS — F603 Borderline personality disorder: Secondary | ICD-10-CM

## 2016-06-04 DIAGNOSIS — F1020 Alcohol dependence, uncomplicated: Secondary | ICD-10-CM

## 2016-06-04 DIAGNOSIS — R441 Visual hallucinations: Secondary | ICD-10-CM

## 2016-06-04 DIAGNOSIS — F3162 Bipolar disorder, current episode mixed, moderate: Secondary | ICD-10-CM | POA: Diagnosis not present

## 2016-06-04 DIAGNOSIS — F1310 Sedative, hypnotic or anxiolytic abuse, uncomplicated: Secondary | ICD-10-CM

## 2016-06-06 ENCOUNTER — Other Ambulatory Visit: Payer: Self-pay

## 2016-06-06 MED ORDER — AMLODIPINE BESYLATE 10 MG PO TABS: 10.0000 mg | ORAL_TABLET | Freq: Every day | ORAL | 0 refills | Status: DC

## 2016-06-08 ENCOUNTER — Encounter (HOSPITAL_COMMUNITY): Payer: Self-pay | Admitting: Clinical
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
## 2016-06-11 ENCOUNTER — Telehealth: Payer: Self-pay | Admitting: Adult Health

## 2016-06-11 DIAGNOSIS — J42 Unspecified chronic bronchitis: Secondary | ICD-10-CM

## 2016-06-11 NOTE — Telephone Encounter (Signed)
[facility] [place], pulm rehab is closed.  Will call back tomorrow morning.

## 2016-06-11 NOTE — Telephone Encounter (Signed)
Called and spoke to [place] with pulm rehab. Truddie Crumble states the pt will need a PFT order or another diagnosis to attend pulm rehab as the current diagnosis is under COPD.   Dr. Elsworth Soho please advise. Thanks.

## 2016-06-11 NOTE — Telephone Encounter (Signed)
Use chronic bronchitis as diagnosis If this does not work, Please schedule spirometry pre-and post

## 2016-06-12 ENCOUNTER — Ambulatory Visit (INDEPENDENT_AMBULATORY_CARE_PROVIDER_SITE_OTHER): Payer: Medicare Other | Admitting: Family Medicine

## 2016-06-12 ENCOUNTER — Encounter: Payer: Self-pay | Admitting: Family Medicine

## 2016-06-12 VITALS — BP 122/76 | HR 60 | Ht 71.0 in | Wt 223.2 lb

## 2016-06-12 DIAGNOSIS — E782 Mixed hyperlipidemia: Secondary | ICD-10-CM

## 2016-06-12 DIAGNOSIS — E119 Type 2 diabetes mellitus without complications: Secondary | ICD-10-CM

## 2016-06-12 DIAGNOSIS — F603 Borderline personality disorder: Secondary | ICD-10-CM | POA: Diagnosis not present

## 2016-06-12 DIAGNOSIS — I10 Essential (primary) hypertension: Secondary | ICD-10-CM

## 2016-06-12 DIAGNOSIS — Z91199 Patient's noncompliance with other medical treatment and regimen due to unspecified reason: Secondary | ICD-10-CM

## 2016-06-12 DIAGNOSIS — R0789 Other chest pain: Secondary | ICD-10-CM

## 2016-06-12 DIAGNOSIS — R079 Chest pain, unspecified: Secondary | ICD-10-CM | POA: Insufficient documentation

## 2016-06-12 DIAGNOSIS — F3111 Bipolar disorder, current episode manic without psychotic features, mild: Secondary | ICD-10-CM

## 2016-06-12 DIAGNOSIS — F1020 Alcohol dependence, uncomplicated: Secondary | ICD-10-CM

## 2016-06-12 DIAGNOSIS — Z9119 Patient's noncompliance with other medical treatment and regimen: Secondary | ICD-10-CM | POA: Diagnosis not present

## 2016-06-12 MED ORDER — ACCU-CHEK AVIVA DEVI: 0 refills | Status: DC

## 2016-06-12 MED ORDER — GLUCOSE BLOOD VI STRP: ORAL_STRIP | 12 refills | Status: DC

## 2016-06-12 MED ORDER — ACCU-CHEK SOFTCLIX LANCET DEV MISC: 12 refills | Status: DC

## 2016-06-12 NOTE — Patient Instructions (Addendum)
-   Cardiology referral placed since it's been over 2 years since you have seen Dr. Mare Ferrari.  - needs better control of your anxiety- I believe these episodes of non-specific chest pains are from that.   So please make a follow-up with your psychiatrist, the doctor who manages your medications.   Instructions:  -please check your blood sugar every morning before you eat or drink anything. -  write this down!     Also check it 2 hours after largest meal of the day. Write it down!  ---> Also check your blood pressure and blood sugar anytime you feel dizzy, lightheaded, or just don't feel well. -- This could be your blood pressure or blood sugar causing you to feel this way and we need to know what these levels are during these periods of your symptoms.        Chest Wall Pain Chest wall pain is pain in or around the bones and muscles of your chest. Sometimes, an injury causes this pain. Sometimes, the cause may not be known. This pain may take several weeks or longer to get better. HOME CARE INSTRUCTIONS  Pay attention to any changes in your symptoms. Take these actions to help with your pain:   Rest as told by your health care provider.   Avoid activities that cause pain. These include any activities that use your chest muscles or your abdominal and side muscles to lift heavy items.   If directed, apply ice to the painful area:  Put ice in a plastic bag.  Place a towel between your skin and the bag.  Leave the ice on for 20 minutes, 2-3 times per day.  Take over-the-counter and prescription medicines only as told by your health care provider.  Do not use tobacco products, including cigarettes, chewing tobacco, and e-cigarettes. If you need help quitting, ask your health care provider.  Keep all follow-up visits as told by your health care provider. This is important. SEEK MEDICAL CARE IF:  You have a fever.  Your chest pain becomes worse.  You have new symptoms. SEEK  IMMEDIATE MEDICAL CARE IF:  You have nausea or vomiting.  You feel sweaty or light-headed.  You have a cough with phlegm (sputum) or you cough up blood.  You develop shortness of breath.   This information is not intended to replace advice given to you by your health care provider. Make sure you discuss any questions you have with your health care provider.   Document Released: 10/01/2005 Document Revised: 06/22/2015 Document Reviewed: 12/27/2014 Elsevier Interactive Patient Education [employer].

## 2016-06-12 NOTE — Progress Notes (Signed)
Impression and Recommendations:    1. Other chest pain   2. Patient's noncompliance with other medical treatment and regimen   3. Essential hypertension   4. Explosive personality disorder   5. Diabetes mellitus without complication- (diet controlled; w/o proteinuria)   6. Bipolar affective disorder, currently manic, mild ([place])   7. Hyperlipidemia, mixed   8. Alcoholism ([place])      Explained to patient that chest pain appears to be anxiety related, and also is reproducible on exam which is consistent with musculoskeletal etiology. Advised heating pad and over-the-counter pain relieving creams/ icy hot to areas of point tenderness in left chest  EKG today is unchanged from prior ones obtained, and shows no acute findings of ACS. However he was lost to follow-up with cardiology so--->   Told patient he should follow-up with his cardiologist and may need a stress test- Last seen Darlin Coco, MD at 05/28/2014 1:40 PM.  I will put in another referral today  Blood pressure-stable.  Told patient he needs follow-up with his psychiatrist, not just psychologist for further medical management as I feel his psychiatric issues are not well-controlled.  Furthermore, it appears these "chest pains" are emotionally mediated and due to his anxiety\panic attacks.  Highly encouraged patient that if he feels dizzy or lightheaded etc he needs to obtain his blood sugars and blood pressure!!  Told to keep a log, bring in next office visit for my review. This is critical in helping determine what is going on/ causing his symptoms.     I also explained to patient he is on multiple medications which could also be causing these symptoms as well.     Furthermore, highly encouraged patient to eat 3-4 small healthy meals daily on a regular basis, evenly distributed throughout the day.  Encouraged patient to continue abstinence explained these symptoms are not related to him quitting drinking.  Pt was  in the office today for 40+ minutes, with over 50% time spent in face to face counseling of various medical concerns and in coordination of care      Patient's Medications  New Prescriptions   BLOOD GLUCOSE MONITORING SUPPL (ACCU-CHEK AVIVA) DEVICE    Use to check blood sugars every morning fasting, 2 hours after largest meal and as needed with low blood sugar symptoms   GLUCOSE BLOOD (ACCU-CHEK AVIVA) TEST STRIP    Check blood sugars every morning fasting, 2 hours after largest meal and as needed with low blood sugar symptoms   LANCET DEVICES (ACCU-CHEK SOFTCLIX) LANCETS    Check blood sugars every morning fasting, 2 hours after largest meal and as needed with low blood sugar symptoms  Previous Medications   ALBUTEROL (PROVENTIL HFA;VENTOLIN HFA) 108 (90 BASE) MCG/ACT INHALER    Inhale 2 puffs into the lungs every 6 (six) hours as needed for wheezing or shortness of breath.   ALPRAZOLAM (XANAX) 1 MG TABLET    Take 0.5 tablets (0.5 mg total) by mouth 3 (three) times daily as needed for anxiety.   AMLODIPINE (NORVASC) 10 MG TABLET    Take 1 tablet (10 mg total) by mouth daily.   ATORVASTATIN (LIPITOR) 40 MG TABLET    One tab q hs   BACLOFEN (LIORESAL) 10 MG TABLET    Take 1 tablet (10 mg total) by mouth 2 (two) times daily.   BREO ELLIPTA 100-25 MCG/INH AEPB    INHALE 1 PUFF BY MOUTH EVERY MORNING   CLINDAMYCIN (CLEOCIN) 300 MG CAPSULE  Take 1 capsule (300 mg total) by mouth 4 (four) times daily. X 7 days   DEXLANSOPRAZOLE (DEXILANT) 60 MG CAPSULE    Take 1 capsule (60 mg total) by mouth daily.   EPINEPHRINE 0.3 MG/0.3 ML IJ SOAJ INJECTION    Inject 0.3 mLs (0.3 mg total) into the muscle once.   FLUTICASONE (FLONASE) 50 MCG/ACT NASAL SPRAY    Place 2 sprays into both nostrils daily.   GABAPENTIN (NEURONTIN) 300 MG CAPSULE    Take 1 capsule (300 mg total) by mouth 3 (three) times daily as needed.   GUAIFENESIN (MUCINEX) 600 MG 12 HR TABLET    Take 1 tablet (600 mg total) by mouth 2 (two) times  daily.   HYDROCODONE-ACETAMINOPHEN (NORCO/VICODIN) 5-325 MG TABLET    Take 1 tablet by mouth every 6 (six) hours as needed for moderate pain.   IPRATROPIUM-ALBUTEROL (DUONEB) 0.5-2.5 (3) MG/3ML SOLN    Take 3 mLs by nebulization every 4 (four) hours as needed (shortness of breath and wheezing).   LORATADINE (CLARITIN) 10 MG TABLET    Take 10 mg by mouth daily as needed for allergies. Reported on 01/25/2016   LOSARTAN (COZAAR) 50 MG TABLET    Take one tab q am for BP   MECLIZINE (ANTIVERT) 25 MG TABLET    Take 25 mg by mouth 3 (three) times daily as needed for dizziness.   METOPROLOL SUCCINATE (TOPROL-XL) 25 MG 24 HR TABLET    Take 1 tablet (25 mg total) by mouth daily.   MONTELUKAST (SINGULAIR) 10 MG TABLET    TAKE 1 TABLET (10 MG TOTAL) BY MOUTH AT BEDTIME.   NITROGLYCERIN (NITROSTAT) 0.4 MG SL TABLET    Place 1 tablet (0.4 mg total) under the tongue every 5 (five) minutes as needed for chest pain.   ONDANSETRON (ZOFRAN) 8 MG TABLET    Take 1 tablet by mouth daily as needed.   OXYGEN    Inhale 2 L into the lungs at bedtime. Reported on 11/25/2015   SODIUM CHLORIDE (OCEAN) 0.65 % SOLN NASAL SPRAY    Place 1 spray into both nostrils as needed for congestion.   TRAMADOL (ULTRAM-ER) 300 MG 24 HR TABLET    Take 300 mg by mouth daily.   VITAMIN D, ERGOCALCIFEROL, (DRISDOL) 50000 UNITS CAPS CAPSULE    Take 50,000 Units by mouth every 7 (seven) days.  Modified Medications   No medications on file  Discontinued Medications   CHOLECALCIFEROL (VITAMIN D3) 5000 UNITS TABS    5,000 IU OTC vitamin D3 daily.    Return in about 2 months (around 08/12/2016) for DM, HTN, HLD.  The patient was counseled, risk factors were discussed, anticipatory guidance given.  Gross side effects, risk and benefits, and alternatives of medications discussed with patient.  Patient is aware that all medications have potential side effects and we are unable to predict every side effect or drug-drug interaction that may occur.   Expresses verbal understanding and consents to current therapy plan and treatment regimen.  Please see AVS handed out to patient at the end of our visit for further patient instructions/ counseling done pertaining to today's office visit.    Note: This document was prepared using Dragon voice recognition software and may include unintentional dictation errors.   --------------------------------------------------------------------------------------------------------------------------------------------------------------------------------------------------------------------------------------------    Subjective:    CC:  Chief Complaint  Patient presents with  . Chest Pain    HPI: [name] is a 65 y.o. male who presents to [place]  at [facility] today for issues as discussed below.  Patient presents to clinic today for an acute care OV, 15 minute work-in OV.  He has multiple concerns and was quite manic in the office today- it was difficult to keep him on track.  Alcoholism:   Apparently patient told me he stopped drinking when he left our office after his last visit. He is wondering if these symptoms can be from withdrawal from alcohol. The symptoms did not come on until 4 more days after he stopped drinking.   He is convinced that he feels bad because he stopped and is insistent that I provide him with "alcohol withdrawal treatment ".   Patient has a psychiatrist whom apparently he sees monthly and psychologist whom he saw yesterday.   CP: Last week, he awoke while sleeping with CP- it woke him from sleep- feeling of tightness in chest, diaphoresis, got panicky, had diarhea real bad and just felt drained and tired.  This episode came on while he was laying in bed. He has been active and walking around without complaint and the symptoms have not occurred while he is been busy during the day. Again this am awoke early as well- with knife-like pain in L breast/ chest area.  Sudden onset while lying down.  2am.  Pt felt panicky and took a xanax and pain eventually it went away.  No other associated symptoms this morning.  HTN:  Started on losartan last ov.  Asked to check blood pressures at home- he has not at all.  Apparently tolerating well without side effect  HLD:  Started on statin last OV- takes nitely before bed.  Tol well , no myalgias/ or new aches or pains in his extremities etc.   DM:  Not checking sugars at all, despite my extensive counseling last couple visits.  Says he does not have a glucometer to check it.   He does endorse recently he had a couple episodes of feeling lightheaded / spacey and after he ate a candy bar, he felt better.  He tells me he can sometimes go all day without eating as well.      Patient remains extremely noncompliant with my treatment recommendations and plans.        Wt Readings from Last 3 Encounters:  06/12/16 223 lb 3.2 oz (101.2 kg)  06/02/16 224 lb (101.6 kg)  05/28/16 224 lb 14.4 oz (102 kg)   BP Readings from Last 3 Encounters:  06/12/16 122/76  06/02/16 136/78  05/28/16 (!) 141/76   Pulse Readings from Last 3 Encounters:  06/12/16 60  06/02/16 76  05/28/16 65     Patient Active Problem List   Diagnosis Date Noted  . Hyperlipidemia, mixed 06/02/2016    Priority: High  . Diabetes mellitus without complication- (diet controlled; w/o proteinuria) 04/30/2016    Priority: High  . COPD (chronic obstructive pulmonary disease) Gold C Frequent exacerbations 01/13/2014    Priority: High  . Hypertension 01/13/2014    Priority: High  . Elevated liver enzymes  06/02/2016    Priority: Medium  . Heavy alcohol use- Many yrs  04/30/2016    Priority: Medium  . Obesity 04/30/2016    Priority: Medium  . h/o Prostate cancer 01/13/2014    Priority: Medium  . Patient's noncompliance with other medical treatment and regimen 06/02/2016    Priority: Low  . Basal cell carcinoma of back 04/30/2016    Priority: Low    . Hx of multiple concussions 04/30/2016  Priority: Low  . h/o Vitamin D deficiency 04/30/2016    Priority: Low  . Immunocompromised state ([place]) 04/30/2016    Priority: Low  . concerns for memory loss 09/13/2014    Priority: Low  . non-specific Chest pain 06/12/2016  . Schizophrenia ([place]) 04/30/2016  . Environmental and seasonal allergies 04/30/2016  . Screen for sexually transmitted diseases 04/30/2016  . Cervical radiculopathy 01/15/2016  . Alcohol withdrawal ([place]) 11/22/2015  . h/o Suicidal thoughts 11/21/2015  . Dry mouth 10/07/2015  . Anxiety 09/19/2015  . OAB (overactive bladder) 07/27/2015  . Chronic idiopathic constipation 05/08/2015  . Screening for colon cancer   . Benign neoplasm of cecum   . Benign neoplasm of ascending colon   . Benign neoplasm of transverse colon   . Benign neoplasm of sigmoid colon   . Rectal polyp   . Gastroesophageal reflux disease without esophagitis   . Esophageal stricture   . Joint stiffness of multiple sites 10/17/2014  . Benign localized hyperplasia of prostate with urinary obstruction 09/29/2014  . Tuberculosis   . Diverticulum of bladder 06/17/2014  . Neuropathy ([place]) 04/11/2014  . Post-polio syndrome 01/13/2014  . Former smoker 01/13/2014  . Bipolar disorder ([place]) 01/13/2014  . Explosive personality disorder 01/13/2014  . Erectile dysfunction 01/13/2014  . Nephrolithiasis 01/13/2014  . Chronic pain 01/13/2014  . Alcoholism ([place]) 01/13/2014  . OSA (obstructive sleep apnea) 01/13/2014    Past Medical history, Surgical history, Family history, Social history, Allergies and Medications have been entered into the medical record, reviewed and changed as needed.   Allergies:  Allergies  Allergen Reactions  . Ivp Dye [Iodinated Diagnostic Agents] Anaphylaxis  . Levaquin [Levofloxacin In D5w] Shortness Of Breath and Swelling    In addition: sweating, chest pain, and diarrhea.   . Penicillins Anaphylaxis    Heart stops Has  patient had a PCN reaction causing immediate rash, facial/tongue/throat swelling, SOB or lightheadedness with hypotension: yes Has patient had a PCN reaction causing severe rash involving mucus membranes or skin necrosis: yes Has patient had a PCN reaction that required hospitalization yes Has patient had a PCN reaction occurring within the last 10 years: No If all of the above answers are "NO", then may proceed with Cephalosporin use.   . Aspirin Other (See Comments)    Reaction unknown  . Nsaids Other (See Comments)    Difficulty breathing  . Tolmetin Other (See Comments)    Difficulty breathing  . Buprenorphine Hcl Nausea And Vomiting    Can take with zofran   . Morphine And Related Nausea And Vomiting    Can take with zofran   . Oxycodone Itching and Rash    Review of Systems: No fever/ chills, night sweats, no unintended weight loss, No chest pain, or increased shortness of breath. No N/V/D.  Pertinent positives and negatives noted in HPI above    Objective:   Blood pressure 122/76, pulse 60, height 5\' 11"  (1.803 m), weight 223 lb 3.2 oz (101.2 kg). Body mass index is 31.13 kg/m. General: Well Developed, well nourished, appropriate for stated age.  Neuro: Alert and oriented x3, extra-ocular muscles intact, sensation grossly intact.  HEENT: Normocephalic, atraumatic, neck supple   Skin: Warm and dry, no gross rash. Cardiac: RRR, S1 S2,  no murmurs rubs or gallops; + TTP L ant chest wall near L nipple- reproduces pain.  Respiratory: ECTA B/L, Not using accessory muscles, speaking in full sentences-unlabored. Vascular:  No gross lower ext edema, cap RF less 2 sec. Psych:  No HI/SI, judgement and insight poor, Expansive, agitated, tangential

## 2016-06-13 ENCOUNTER — Encounter (INDEPENDENT_AMBULATORY_CARE_PROVIDER_SITE_OTHER): Payer: Medicare Other | Admitting: Ophthalmology

## 2016-06-13 DIAGNOSIS — H35033 Hypertensive retinopathy, bilateral: Secondary | ICD-10-CM | POA: Diagnosis not present

## 2016-06-13 DIAGNOSIS — I10 Essential (primary) hypertension: Secondary | ICD-10-CM

## 2016-06-13 DIAGNOSIS — H43813 Vitreous degeneration, bilateral: Secondary | ICD-10-CM

## 2016-06-13 DIAGNOSIS — H2513 Age-related nuclear cataract, bilateral: Secondary | ICD-10-CM

## 2016-06-13 DIAGNOSIS — E11319 Type 2 diabetes mellitus with unspecified diabetic retinopathy without macular edema: Secondary | ICD-10-CM | POA: Diagnosis not present

## 2016-06-13 DIAGNOSIS — E113291 Type 2 diabetes mellitus with mild nonproliferative diabetic retinopathy without macular edema, right eye: Secondary | ICD-10-CM

## 2016-06-13 LAB — HM DIABETES EYE EXAM

## 2016-06-13 NOTE — Telephone Encounter (Signed)
Attempted to call pulmonary rehab, but no answer.  Will call back

## 2016-06-14 IMAGING — CR DG THORACIC SPINE 2V
3 series · 3 of 3 positions shown · non-contrast
Comparison: Two-view chest x-ray 08/18/2014.

CLINICAL DATA: Fall 08/25/2014. Posterior right rib pain. The
patient fell from the same level, but headache coffee table on has
back.

EXAM:
THORACIC SPINE - 2 VIEW

[w t-spine a.p. *]
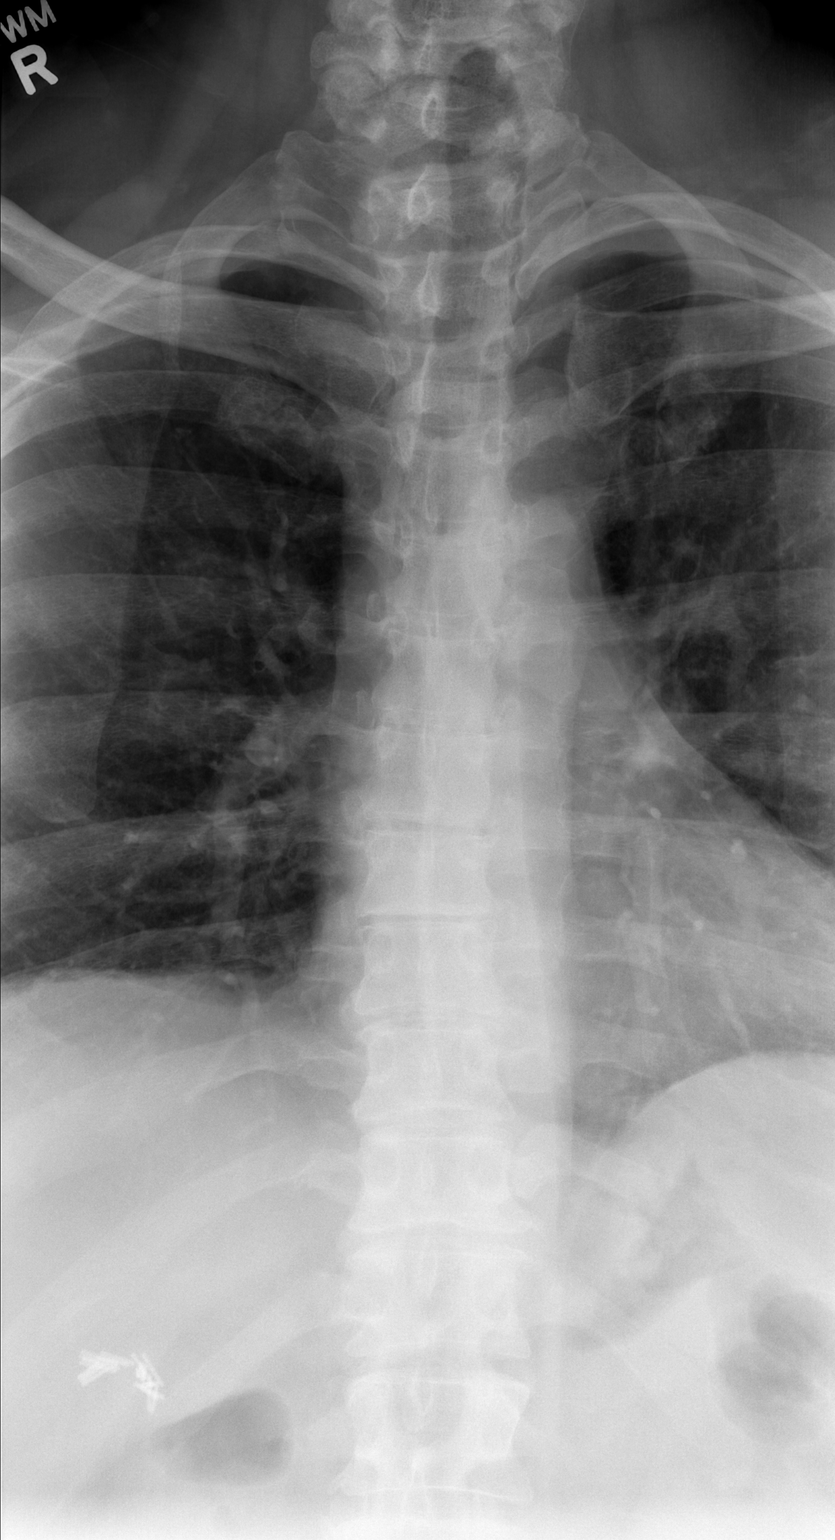

[w t-spine lat *]
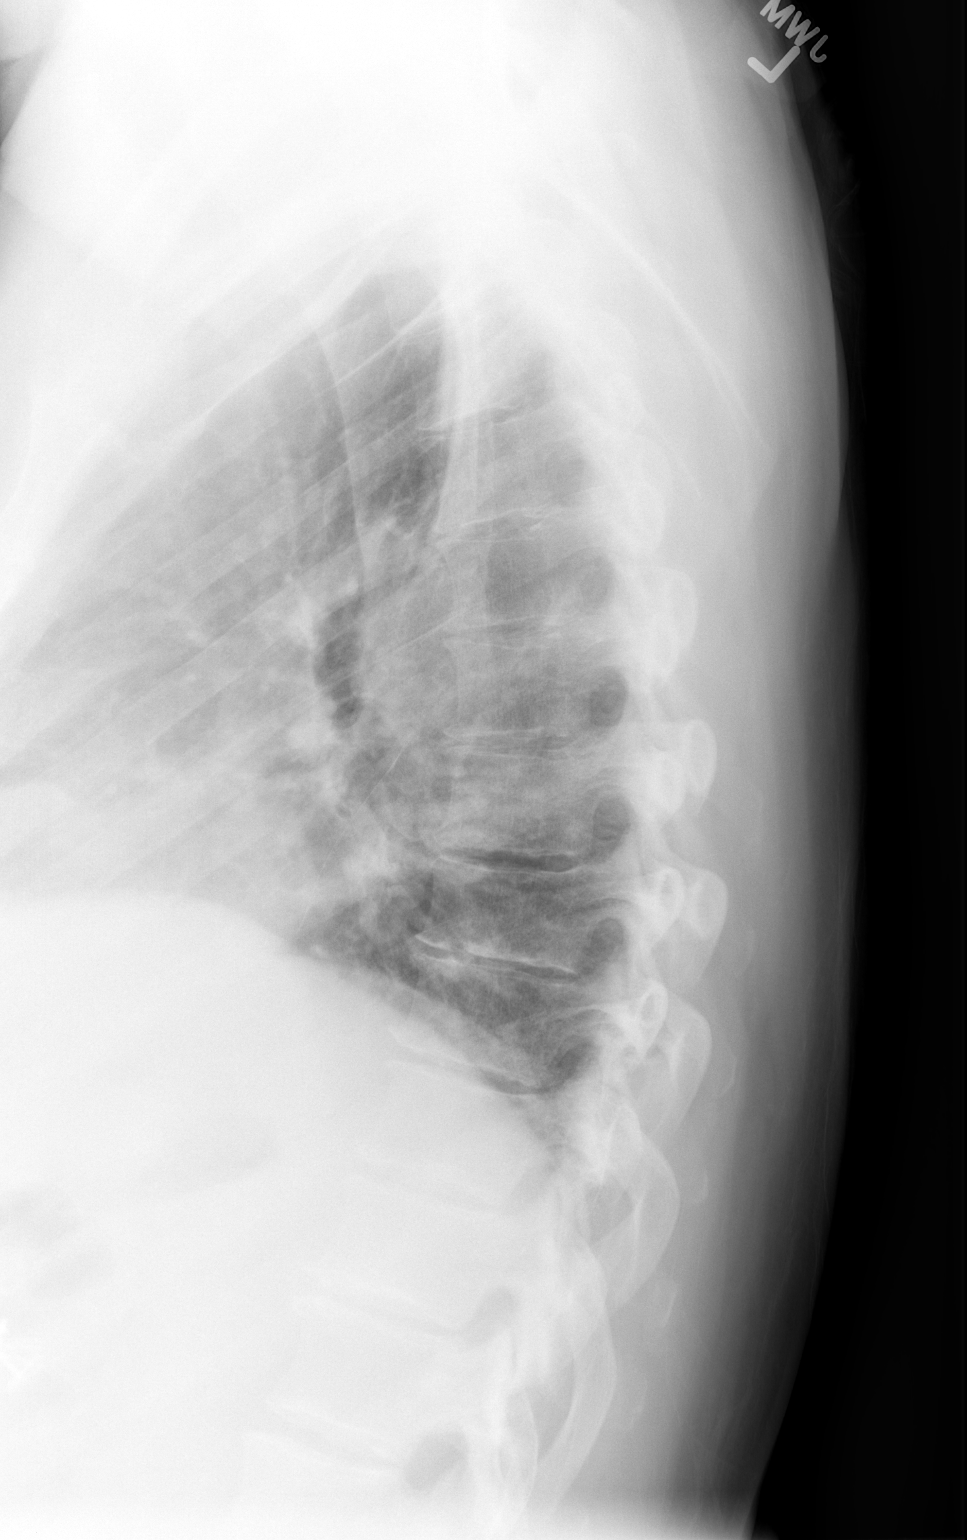

[w swimmers view]
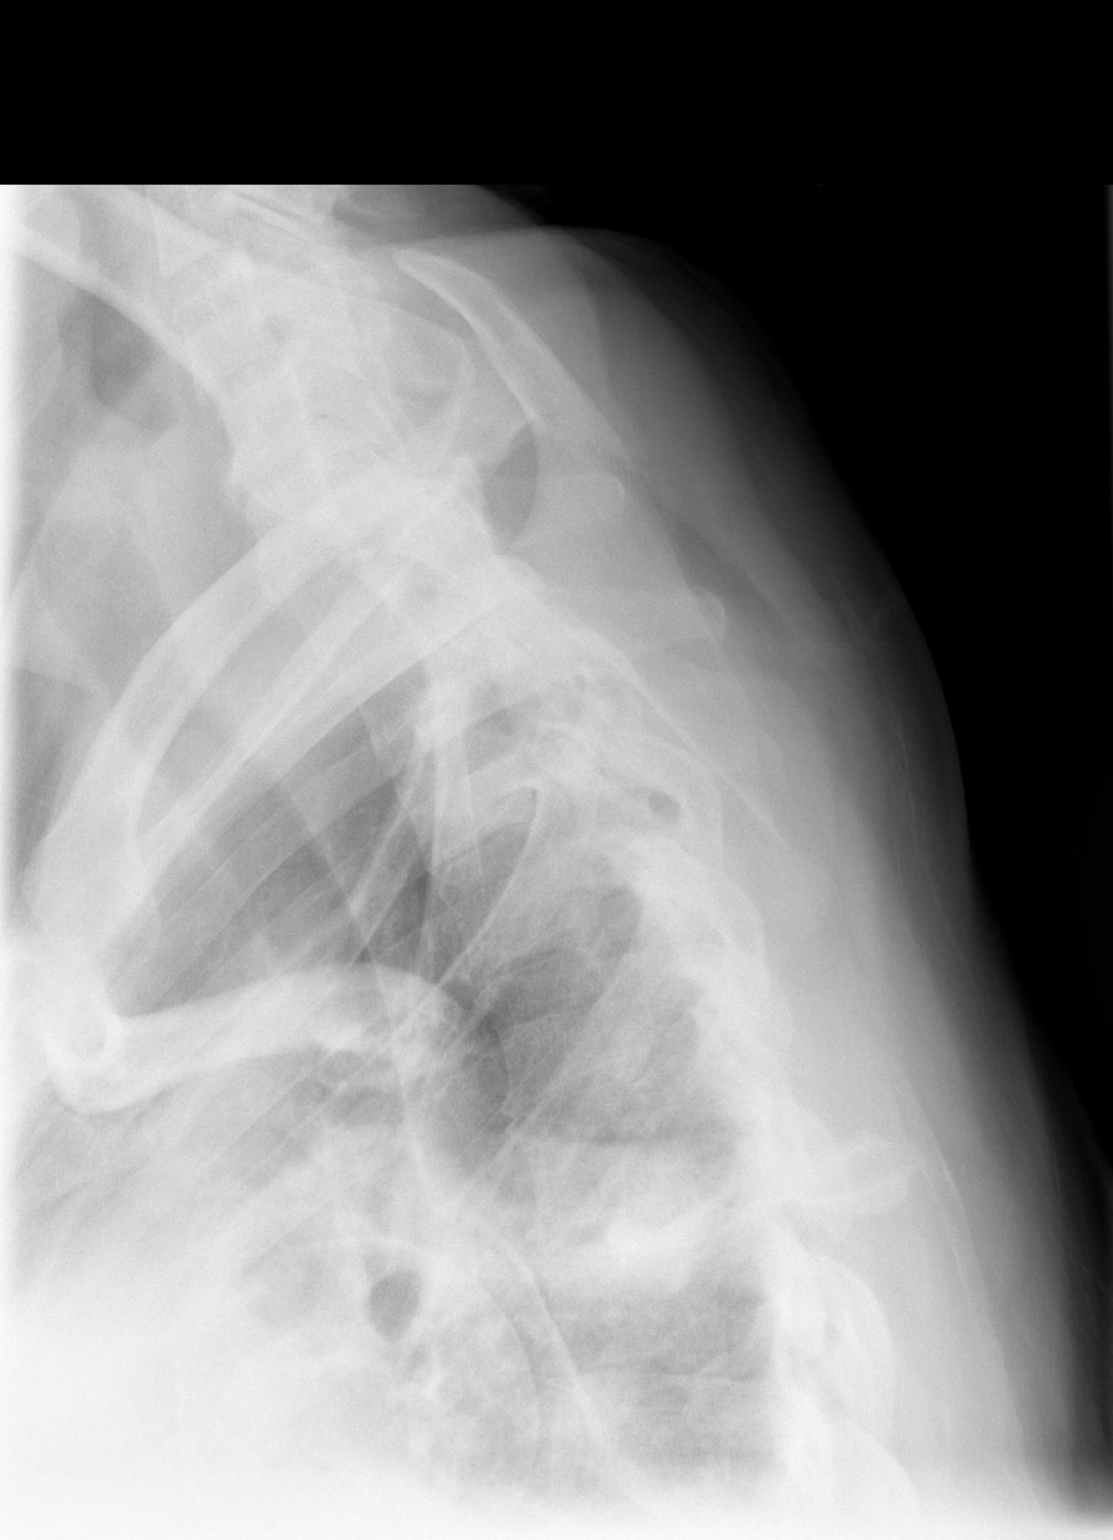

[3 of 3 positions shown; findings below may reference images not displayed]

FINDINGS: Twelve non rib-bearing thoracic type vertebral bodies are present.
Mild endplate degenerative changes are present. Vertebral body
heights and alignment are maintained. Degenerative changes are noted
in the lower cervical spine.
IMPRESSION: 1. No acute abnormality.
2. Stable degenerative change.

## 2016-06-14 NOTE — Telephone Encounter (Signed)
Attempted to call pulmonary rehab but no answer.  Will call back.

## 2016-06-15 NOTE — Telephone Encounter (Signed)
Ok to use chronic bronchitis

## 2016-06-15 NOTE — Telephone Encounter (Signed)
Called and spoke with [facility] and she stated that from a previous phone note (8/4) that the pt was looking for an exercise plan for weight loss.  In that note we offered Pulmonary Rehab, but the pt declined.    If RA wants the pt to be set up with Pulmonary rehab we can use the DX of chronic bronchitis and Cloyde Reams stated that they would reach out to the pt to schedule.  RA please advise. thanks

## 2016-06-15 NOTE — Telephone Encounter (Signed)
LM for Portia to return call.

## 2016-06-19 ENCOUNTER — Ambulatory Visit (HOSPITAL_COMMUNITY): Payer: Self-pay | Admitting: Clinical

## 2016-06-19 NOTE — Telephone Encounter (Signed)
Portia called back and stated that if we will send the order over for the pt that she will called and talk to the pt about setting him up for rehab.  Order has been placed.

## 2016-06-19 NOTE — Telephone Encounter (Signed)
lmtcb for Portia in pulm rehab.

## 2016-06-21 ENCOUNTER — Telehealth: Payer: Self-pay | Admitting: Family Medicine

## 2016-06-21 DIAGNOSIS — E119 Type 2 diabetes mellitus without complications: Secondary | ICD-10-CM

## 2016-06-21 NOTE — Telephone Encounter (Signed)
Pt states that he has a history of nausea after having bowel movements.  He states that he previously informed Dr. Henrene Pastor, GI, of this problem and was told that this was "ok" and he should continue to use Zofran prn.  He has not seen Dr. Henrene Pastor since last year.  He also states that he is having more nausea since he stopped drinking alcohol.  Pt request RX for Zofran.  Please advise.

## 2016-06-21 NOTE — Telephone Encounter (Signed)
If Dr. Henrene Pastor, his gastroenterologist, had already assessed patient for these symptoms and said that it was "okay" and to continue Zofran, patient needs to contact that txing doc for further treatment plan/ refills of med if they see fit.

## 2016-06-21 NOTE — Telephone Encounter (Signed)
[name] called into the office to get a refill for zofran.  I looked in the system and Dr. Raliegh Scarlet has not prescribed this medication to him.  He also needs a referral to [facility].  Please call patient at [phone].

## 2016-06-21 NOTE — Telephone Encounter (Signed)
LVM for pt to return call to discuss requests for meds and referral

## 2016-06-22 ENCOUNTER — Telehealth: Payer: Self-pay

## 2016-06-22 NOTE — Telephone Encounter (Signed)
Pt informed to contact Dr. Blanch Media office for Zofran RX.  Pt expressed understanding and is agreeable.  Referral ordered for [facility] per Dr. Raliegh Scarlet.  Charyl Bigger, CMA

## 2016-06-22 NOTE — Telephone Encounter (Signed)
Pt called stating that he has had several episodes of dizziness and chest pains.  He states that he initially thought it was from his blood sugars and ate a candy bar and drank a soda.  This did not alleviate his symptoms.  He later went to his neighbor's and checked his blood pressure.  Pt states that his blood pressure was 173/76.  Pt stated that he thinks that his symptoms may be from taking Claritin and his blood pressure medication together.  Pt called his wife and she is coming home to take him to the ER.  I advised pt that he needed to call EMS since he is having chest pains, however pt refused, stating again that his wife was coming home to take him to the ER.  Charyl Bigger, CMA

## 2016-06-25 ENCOUNTER — Emergency Department (HOSPITAL_COMMUNITY): Payer: Medicare Other

## 2016-06-25 ENCOUNTER — Observation Stay (HOSPITAL_COMMUNITY)
Admission: EM | Admit: 2016-06-25 | Discharge: 2016-06-26 | Disposition: A | Discharge status: Home / Self Care | Payer: Medicare Other | Attending: Internal Medicine | Admitting: Internal Medicine

## 2016-06-25 ENCOUNTER — Encounter (HOSPITAL_COMMUNITY): Payer: Self-pay | Admitting: *Deleted

## 2016-06-25 DIAGNOSIS — Z8611 Personal history of tuberculosis: Secondary | ICD-10-CM | POA: Insufficient documentation

## 2016-06-25 DIAGNOSIS — G14 Postpolio syndrome: Secondary | ICD-10-CM | POA: Diagnosis not present

## 2016-06-25 DIAGNOSIS — Z79899 Other long term (current) drug therapy: Secondary | ICD-10-CM | POA: Diagnosis not present

## 2016-06-25 DIAGNOSIS — Z87891 Personal history of nicotine dependence: Secondary | ICD-10-CM | POA: Diagnosis not present

## 2016-06-25 DIAGNOSIS — K219 Gastro-esophageal reflux disease without esophagitis: Secondary | ICD-10-CM | POA: Diagnosis not present

## 2016-06-25 DIAGNOSIS — J302 Other seasonal allergic rhinitis: Secondary | ICD-10-CM | POA: Diagnosis not present

## 2016-06-25 DIAGNOSIS — E669 Obesity, unspecified: Secondary | ICD-10-CM | POA: Diagnosis not present

## 2016-06-25 DIAGNOSIS — Z9079 Acquired absence of other genital organ(s): Secondary | ICD-10-CM | POA: Insufficient documentation

## 2016-06-25 DIAGNOSIS — Z87820 Personal history of traumatic brain injury: Secondary | ICD-10-CM | POA: Diagnosis not present

## 2016-06-25 DIAGNOSIS — I10 Essential (primary) hypertension: Secondary | ICD-10-CM | POA: Diagnosis not present

## 2016-06-25 DIAGNOSIS — Z85828 Personal history of other malignant neoplasm of skin: Secondary | ICD-10-CM | POA: Diagnosis not present

## 2016-06-25 DIAGNOSIS — E119 Type 2 diabetes mellitus without complications: Secondary | ICD-10-CM | POA: Insufficient documentation

## 2016-06-25 DIAGNOSIS — Z8546 Personal history of malignant neoplasm of prostate: Secondary | ICD-10-CM | POA: Diagnosis not present

## 2016-06-25 DIAGNOSIS — Z886 Allergy status to analgesic agent status: Secondary | ICD-10-CM | POA: Insufficient documentation

## 2016-06-25 DIAGNOSIS — Z9981 Dependence on supplemental oxygen: Secondary | ICD-10-CM | POA: Insufficient documentation

## 2016-06-25 DIAGNOSIS — J438 Other emphysema: Secondary | ICD-10-CM | POA: Diagnosis present

## 2016-06-25 DIAGNOSIS — R0789 Other chest pain: Secondary | ICD-10-CM | POA: Diagnosis not present

## 2016-06-25 DIAGNOSIS — E1169 Type 2 diabetes mellitus with other specified complication: Secondary | ICD-10-CM

## 2016-06-25 DIAGNOSIS — Z85820 Personal history of malignant melanoma of skin: Secondary | ICD-10-CM | POA: Diagnosis not present

## 2016-06-25 DIAGNOSIS — Z6830 Body mass index (BMI) 30.0-30.9, adult: Secondary | ICD-10-CM | POA: Insufficient documentation

## 2016-06-25 DIAGNOSIS — E782 Mixed hyperlipidemia: Secondary | ICD-10-CM | POA: Insufficient documentation

## 2016-06-25 DIAGNOSIS — R0602 Shortness of breath: Secondary | ICD-10-CM | POA: Diagnosis not present

## 2016-06-25 DIAGNOSIS — R079 Chest pain, unspecified: Principal | ICD-10-CM | POA: Insufficient documentation

## 2016-06-25 DIAGNOSIS — J441 Chronic obstructive pulmonary disease with (acute) exacerbation: Secondary | ICD-10-CM | POA: Insufficient documentation

## 2016-06-25 DIAGNOSIS — Z9049 Acquired absence of other specified parts of digestive tract: Secondary | ICD-10-CM | POA: Insufficient documentation

## 2016-06-25 DIAGNOSIS — G4733 Obstructive sleep apnea (adult) (pediatric): Secondary | ICD-10-CM | POA: Insufficient documentation

## 2016-06-25 DIAGNOSIS — F419 Anxiety disorder, unspecified: Secondary | ICD-10-CM | POA: Insufficient documentation

## 2016-06-25 LAB — BASIC METABOLIC PANEL
Anion gap: 6 (ref 5–15)
BUN: 18 mg/dL (ref 6–20)
CO2: 26 mmol/L (ref 22–32)
CREATININE: 1.32 mg/dL — AB (ref 0.61–1.24)
Calcium: 9.4 mg/dL (ref 8.9–10.3)
Chloride: 105 mmol/L (ref 101–111)
GFR calc Af Amer: >60 mL/min (ref >60)
GFR, EST NON AFRICAN AMERICAN: 55 mL/min — AB (ref >60)
GLUCOSE: 147 mg/dL — AB (ref 65–99)
POTASSIUM: 4.2 mmol/L (ref 3.5–5.1)
SODIUM: 137 mmol/L (ref 135–145)

## 2016-06-25 LAB — CBC
HEMATOCRIT: 41 % (ref 39.0–52.0)
Hemoglobin: 13.9 g/dL (ref 13.0–17.0)
MCH: 31.7 pg (ref 26.0–34.0)
MCHC: 33.9 g/dL (ref 30.0–36.0)
MCV: 93.6 fL (ref 78.0–100.0)
PLATELETS: 269 10*3/uL (ref 150–400)
RBC: 4.38 MIL/uL (ref 4.22–5.81)
RDW: 12.4 % (ref 11.5–15.5)
WBC: 8.1 10*3/uL (ref 4.0–10.5)

## 2016-06-25 LAB — I-STAT TROPONIN, ED: Troponin i, poc: 0 ng/mL (ref 0.00–0.08)

## 2016-06-25 MED ORDER — GI COCKTAIL ~~LOC~~
30.0000 mL | Freq: Once | ORAL | Status: AC
  Administered 2016-06-26: 30 mL via ORAL
  Filled 2016-06-25: qty 30

## 2016-06-25 NOTE — ED Triage Notes (Signed)
Pt complains of chest pain since Friday. Pt states the pain went away then came back today. Pt states his blood pressure has been fluctuating throughout the day. Pt has hx of COPD.

## 2016-06-25 NOTE — ED Provider Notes (Signed)
[facility] DEPT Provider Note   CSN: [id number] Arrival date & time: 06/25/16  1930  By signing my name below, I, Ephriam Jenkins, attest that this documentation has been prepared under the direction and in the presence of [employer].  Electronically Signed: Ephriam Jenkins, ED Scribe. 06/25/16. 12:07 AM.   History   Chief Complaint Chief Complaint  Patient presents with  . Chest Pain    HPI HPI Comments: [name] is a 65 y.o. Male with a PMHx of DM, COPD, HLD who presents to the Emergency Department complaining of intermittent chest pain and lightheadedness, onset one week ago. Pt describes a sharp chest pain shooting from the center of his chest to the left side of his chest that lasts for about one hour and then subsides. He also describes pressure to his central chest that is constant throughout the day. He states that these episodes have become more frequent. Pt reports that these episodes are brought on randomly. He states that it "feels like my blood pressure is dropping" and becomes intermittently short of breath during these episodes. Pt had an episode today which caused him to come to the ED for evaluation. He took his allergy medication, blood pressure medication and Claritin D before he had an episode four days ago. Pt was recently dx with DM, A1C was 6.9 one year ago, was 5.2 three weeks ago. Pt had stress test last year by his cardiologist which was normal. He is a former smoker, quit 3 years ago.      Past Medical History:  Diagnosis Date  . Anxiety   . Anxiety disorder   . Arthritis    BILATERAL SHOULDERS, ELBOWS AND HANDS AND LEFT HIP AND KNEES--HX CORTISONE SHOTS IN SHOULDERS, ELBOWS, HIP AND KNEES  . Basal cell carcinoma of back 04/30/2016   Dermatologist- Dr Nevada Crane;   MOHS sx- Dr Levada Dy   . Bladder outlet obstruction   . Chronic idiopathic constipation 05/08/2015  . Complication of anesthesia    DIFFICULT WAKING   . COPD (chronic obstructive pulmonary  disease) Gold C Frequent exacerbations 01/13/2014   [name] 07/08/14: FeV1 51% FeV1/FVC 66% FVC 59% 10/5/2015ONO RA was normal  10/13/2014  ONO on RA NORMAL   . COPD, frequent exacerbations ([place])    pulmologist-  dr Joya Gaskins--  [place] Rm Stage C.04-25-15 recent COPD exacerbation-much improved now, after tx. in ER [facility].  Marland Kitchen Depression   . Diabetes mellitus without complication ([place])    BODERLINE - DIET CONTROL  . Dysrhythmia    PVC'S  . Family history of adverse reaction to anesthesia    father would wake up with agitation   . Former smoker 01/13/2014  . Gastroesophageal reflux disease without esophagitis   . GERD (gastroesophageal reflux disease)   . Heavy alcohol use 04/30/2016  . History of chronic bronchitis   . History of oxygen administration    oxygen use 2 l/m nasally at bedtime and exertional occasions  . History of rheumatic fever   . History of TB (tuberculosis)    1984--  hospitalized for 4 month treatment  . History of urinary retention   . Hx of multiple concussions    x 2 per patient   . Hypertension   . Melanoma ([place])   . Nocturnal oxygen desaturation    USES O2 NIGHTLY  . OSA (obstructive sleep apnea) 01/13/2014  . Pneumonia    hx of   . PONV (postoperative nausea and vomiting)   . Post-polio syndrome  polio at age 68--PT WAS IN IRON LUNG; PT WAS IN W/C UNTIL AGE 58; STILL HAS WEAKNESS RIGHT SIDE  . Prostate cancer ([place])   . Prostate cancer ([place])   . Schizophrenia ([place])   . Shortness of breath dyspnea    RIGHT HEMIDIAPHRAGM ELEVATION - POST POLIO SYNDROME  . Tuberculosis    Hosp 4 months rx , left early     Patient Active Problem List   Diagnosis Date Noted  . non-specific Chest pain 06/12/2016  . Patient's noncompliance with other medical treatment and regimen 06/02/2016  . Hyperlipidemia, mixed 06/02/2016  . Elevated liver enzymes  06/02/2016  . Schizophrenia ([place]) 04/30/2016  . Environmental and seasonal allergies 04/30/2016  . Diabetes mellitus without  complication- (diet controlled; w/o proteinuria) 04/30/2016  . Heavy alcohol use- Many yrs  04/30/2016  . Basal cell carcinoma of back 04/30/2016  . Obesity 04/30/2016  . Hx of multiple concussions 04/30/2016  . h/o Vitamin D deficiency 04/30/2016  . Screen for sexually transmitted diseases 04/30/2016  . Immunocompromised state ([place]) 04/30/2016  . Cervical radiculopathy 01/15/2016  . Alcohol withdrawal ([place]) 11/22/2015  . h/o Suicidal thoughts 11/21/2015  . Dry mouth 10/07/2015  . Anxiety 09/19/2015  . OAB (overactive bladder) 07/27/2015  . Chronic idiopathic constipation 05/08/2015  . Screening for colon cancer   . Benign neoplasm of cecum   . Benign neoplasm of ascending colon   . Benign neoplasm of transverse colon   . Benign neoplasm of sigmoid colon   . Rectal polyp   . Gastroesophageal reflux disease without esophagitis   . Esophageal stricture   . Joint stiffness of multiple sites 10/17/2014  . Benign localized hyperplasia of prostate with urinary obstruction 09/29/2014  . concerns for memory loss 09/13/2014  . Tuberculosis   . Diverticulum of bladder 06/17/2014  . Neuropathy ([place]) 04/11/2014  . Post-polio syndrome 01/13/2014  . COPD (chronic obstructive pulmonary disease) Gold C Frequent exacerbations 01/13/2014  . Hypertension 01/13/2014  . Former smoker 01/13/2014  . h/o Prostate cancer 01/13/2014  . Bipolar disorder ([place]) 01/13/2014  . Explosive personality disorder 01/13/2014  . Erectile dysfunction 01/13/2014  . Nephrolithiasis 01/13/2014  . Chronic pain 01/13/2014  . Alcoholism ([place]) 01/13/2014  . OSA (obstructive sleep apnea) 01/13/2014    Past Surgical History:  Procedure Laterality Date  . CARDIOVASCULAR STRESS TEST  06-08-2014  dr Mare Ferrari   normal lexiscan study/  no ischemia/  not gated due to PAC's  . COLONOSCOPY N/A 05/03/2015   Procedure: COLONOSCOPY;  Surgeon: [name], MD;  Location: [facility];  Service: Endoscopy;  Laterality: N/A;  .  CYSTOSCOPY N/A 10/25/2015   Procedure: CYSTOSCOPY;  Surgeon: [name], MD;  Location: [facility];  Service: Urology;  Laterality: N/A;  . CYSTOSCOPY W/ CYSTOGRAM/  TRANSRECTAL ULTRASOUND PROSTATE BX  03-22-2009  . ESOPHAGOGASTRODUODENOSCOPY N/A 03/22/2015   Procedure: ESOPHAGOGASTRODUODENOSCOPY (EGD) with dilation;  Surgeon: [name], MD;  Location: [facility];  Service: Endoscopy;  Laterality: N/A;  . excision of skin lesion    . LAPAROSCOPIC CHOLECYSTECTOMY  2013  . left elbow surgery      due to fracture   . NASAL SEPTUM SURGERY  2000  . OTHER SURGICAL HISTORY      Muscle & bone Graft/Polio  . polio surgeries      14 polio surgeries   . PROSTATE BIOPSY N/A 09/28/2014   Procedure: PROSTATE ULTRASOUND/BIOPSY;  Surgeon: [name], MD;  Location: [facility];  Service: Urology;  Laterality: N/A;  .  PROSTATE BIOPSY N/A 10/25/2015   Procedure: PROSTATE BIOPSY AND ULTRASOUND;  Surgeon: [name], MD;  Location: [facility];  Service: Urology;  Laterality: N/A;  . SAVORY DILATION N/A 03/22/2015   Procedure: SAVORY DILATION;  Surgeon: [name], MD;  Location: [facility];  Service: Endoscopy;  Laterality: N/A;  . SHOULDER ARTHROSCOPY WITH OPEN ROTATOR CUFF REPAIR Bilateral 2013  &  1999   removal spurs and bursectomy  . TRANSURETHRAL INCISION OF BLADDER NECK N/A 10/25/2015   Procedure:  TRANSURETHRAL INCISION OF BLADDER NECK;  Surgeon: [name], MD;  Location: [facility];  Service: Urology;  Laterality: N/A;  . TRANSURETHRAL RESECTION OF PROSTATE N/A 09/28/2014   Procedure: TRANSURETHRAL RESECTION OF THE PROSTATE (TURP);  Surgeon: [name], MD;  Location: [facility];  Service: Urology;  Laterality: N/A;  . URETEROSOPY STONE EXTRACTION  2000     Home Medications    Prior to Admission medications   Medication Sig Start Date End Date Taking? Authorizing Provider  albuterol (PROVENTIL HFA;VENTOLIN HFA) 108 (90 Base) MCG/ACT inhaler Inhale 2 puffs into the lungs every 6 (six) hours as needed for  wheezing or shortness of breath. 10/20/15  Yes Brunetta Jeans, PA-C  ALPRAZolam [name]) 1 MG tablet Take 0.5 tablets (0.5 mg total) by mouth 3 (three) times daily as needed for anxiety. 04/11/16  Yes Debbrah Alar, NP  amLODipine (NORVASC) 10 MG tablet Take 1 tablet (10 mg total) by mouth daily. 06/06/16  Yes [name], DO  atorvastatin (LIPITOR) 40 MG tablet One tab q hs 05/28/16  Yes Deborah Opalski, DO  baclofen (LIORESAL) 10 MG tablet Take 1 tablet (10 mg total) by mouth 2 (two) times daily. Patient taking differently: Take 10 mg by mouth daily as needed (for leg cramps).  04/03/16  Yes Brunetta Jeans, PA-C  Blood Glucose Monitoring Suppl (ACCU-CHEK AVIVA) device Use to check blood sugars every morning fasting, 2 hours after largest meal and as needed with low blood sugar symptoms 06/12/16 06/12/17 Yes Deborah Opalski, DO  BREO ELLIPTA 100-25 MCG/INH AEPB INHALE 1 PUFF BY MOUTH EVERY MORNING 03/18/16  Yes Brunetta Jeans, PA-C  dexlansoprazole (DEXILANT) 60 MG capsule Take 1 capsule (60 mg total) by mouth daily. Patient taking differently: Take 60 mg by mouth every other day.  12/21/15  Yes Brunetta Jeans, PA-C  glucose blood (ACCU-CHEK AVIVA) test strip Check blood sugars every morning fasting, 2 hours after largest meal and as needed with low blood sugar symptoms 06/12/16  Yes Deborah Opalski, DO  guaiFENesin (MUCINEX) 600 MG 12 hr tablet Take 1 tablet (600 mg total) by mouth 2 (two) times daily. Patient taking differently: Take 600 mg by mouth 2 (two) times daily as needed for cough or to loosen phlegm.  11/23/15  Yes Geradine Girt, DO  HYDROcodone-acetaminophen (NORCO/VICODIN) 5-325 MG tablet Take 1 tablet by mouth every 6 (six) hours as needed for moderate pain.   Yes Historical Provider, MD  ipratropium-albuterol (DUONEB) 0.5-2.5 (3) MG/3ML SOLN Take 3 mLs by nebulization every 4 (four) hours as needed (shortness of breath and wheezing).   Yes Historical Provider, MD  Lancet Devices  [facility]) lancets Check blood sugars every morning fasting, 2 hours after largest meal and as needed with low blood sugar symptoms 06/12/16  Yes [name], DO  losartan (COZAAR) 50 MG tablet Take one tab q am for BP 05/28/16  Yes Deborah Opalski, DO  meclizine (ANTIVERT) 25 MG tablet Take 25 mg by mouth 3 (three)  times daily as needed for dizziness.   Yes Historical Provider, MD  metoprolol succinate (TOPROL-XL) 25 MG 24 hr tablet Take 1 tablet (25 mg total) by mouth daily. 04/02/16  Yes Brunetta Jeans, PA-C  montelukast (SINGULAIR) 10 MG tablet TAKE 1 TABLET (10 MG TOTAL) BY MOUTH AT BEDTIME. Patient taking differently: Take 10 mg by mouth at bedtime as needed (for allergies.).  04/02/16  Yes Brunetta Jeans, PA-C  nitroGLYCERIN (NITROSTAT) 0.4 MG SL tablet Place 1 tablet (0.4 mg total) under the tongue every 5 (five) minutes as needed for chest pain. 06/21/15  Yes Brunetta Jeans, PA-C  ondansetron (ZOFRAN) 8 MG tablet Take 8 mg by mouth daily as needed.  02/21/16  Yes Historical Provider, MD  OXYGEN Inhale 2 L into the lungs at bedtime. Reported on 11/25/2015   Yes Historical Provider, MD  oxymetazoline (AFRIN) 0.05 % nasal spray Place 1 spray into both nostrils 4 (four) times daily as needed for congestion.   Yes Historical Provider, MD  clindamycin (CLEOCIN) 300 MG capsule Take 1 capsule (300 mg total) by mouth 4 (four) times daily. X 7 days Patient not taking: Reported on 06/12/2016 06/02/16   April Palumbo, MD  EPINEPHrine 0.3 mg/0.3 mL IJ SOAJ injection Inject 0.3 mLs (0.3 mg total) into the muscle once. Patient not taking: Reported on 06/26/2016 02/24/16   Malvin Johns, MD  fluticasone (FLONASE) 50 MCG/ACT nasal spray Place 2 sprays into both nostrils daily. Patient not taking: Reported on 06/26/2016 08/03/15   Brunetta Jeans, PA-C  gabapentin (NEURONTIN) 300 MG capsule Take 1 capsule (300 mg total) by mouth 3 (three) times daily as needed. Patient not taking: Reported on  06/12/2016 04/02/16   Brunetta Jeans, PA-C    Family History Family History  Problem Relation Age of Onset  . Alzheimer's disease Father 4    Deceased  . Stomach cancer Father   . Heart attack Father   . Heart disease Father   . Skin cancer Mother     Facial-Living  . Cancer Mother     skin  . Alcohol abuse Sister     x2  . Mental illness Sister     x2  . Alcohol abuse Sister   . Diabetes Maternal Aunt     x2  . Thyroid disease Maternal Aunt     x4  . Diabetes Maternal Uncle   . Tuberculosis Paternal Grandfather   . Tuberculosis Paternal Grandmother   . Alzheimer's disease Paternal Aunt   . Alzheimer's disease Paternal Uncle   . Colon cancer Neg Hx   . Colon polyps Neg Hx   . Crohn's disease Neg Hx   . Ulcerative colitis Neg Hx     Social History Social History  Substance Use Topics  . Smoking status: Former Smoker    Years: 47.00    Quit date: 08/25/2013  . Smokeless tobacco: Former Systems developer  . Alcohol use 3.6 oz/week    6 Cans of beer per week     Comment: daily    Allergies   Ivp dye [iodinated diagnostic agents]; Levaquin [levofloxacin in d5w]; Penicillins; Aspirin; Nsaids; Tolmetin; Buprenorphine hcl; Morphine and related; and Oxycodone   Review of Systems Review of Systems  Respiratory: Positive for shortness of breath (intermittent).   Cardiovascular: Positive for chest pain (pressure).  Neurological: Positive for light-headedness.  All other systems reviewed and are negative.    Physical Exam Updated Vital Signs BP 122/56   Pulse (!) 56  Temp 98.3 F (36.8 C) (Oral)   Resp 16   SpO2 100%   Physical Exam  Constitutional: He is oriented to person, place, and time. He appears well-developed and well-nourished. No distress.  HENT:  Head: Normocephalic and atraumatic.  Neck: Normal range of motion.  Cardiovascular: Normal rate, regular rhythm, normal heart sounds and intact distal pulses.  Exam reveals no gallop and no friction rub.   No  murmur heard. Pulmonary/Chest: Effort normal and breath sounds normal. No respiratory distress. He has no wheezes. He has no rales. He exhibits no tenderness.  Abdominal: Soft. He exhibits no distension. There is no tenderness.  Musculoskeletal: He exhibits tenderness. He exhibits no edema.  Exquisite tenderness over the sternum. No lower extremity swelling.  Neurological: He is alert and oriented to person, place, and time.  Skin: Skin is warm and dry. He is not diaphoretic.  Psychiatric: He has a normal mood and affect. Judgment normal.  Nursing note and vitals reviewed.   ED Treatments / Results  DIAGNOSTIC STUDIES: Oxygen Saturation is 100% on RA, normal by my interpretation.  COORDINATION OF CARE: 11:16 PM-Will order repeat Troponin. Discussed treatment plan with pt at bedside and pt agreed to plan.   Labs (all labs ordered are listed, but only abnormal results are displayed) Labs Reviewed  BASIC METABOLIC PANEL - Abnormal; Notable for the following:       Result Value   Glucose, Bld 147 (*)    Creatinine, Ser 1.32 (*)    GFR calc non Af Amer 55 (*)    All other components within normal limits  CBC  TROPONIN I  TROPONIN I  TROPONIN I  TROPONIN I  I-STAT TROPOININ, ED    EKG  EKG Interpretation  Date/Time:  Monday June 25 2016 19:47:38 EDT Ventricular Rate:  64 PR Interval:    QRS Duration: 97 QT Interval:  403 QTC Calculation: 416 R Axis:   31 Text Interpretation:  Sinus rhythm RSR' in V1 or V2, right VCD or RVH Confirmed by [facility]  MD, APRIL ([id number]) on 06/26/2016 12:48:43 AM       Radiology Dg Chest 2 View  Result Date: 06/25/2016 CLINICAL DATA:  Chest pain for 3 days.  History of COPD. EXAM: CHEST  2 VIEW COMPARISON:  Chest radiograph November 21, 2015 FINDINGS: Cardiomediastinal silhouette is normal. No pleural effusions or focal consolidations. Minimal strandy densities LEFT lung base. Trachea projects midline and there is no pneumothorax.  Minimal apical pleural thickening. Soft tissue planes and included osseous structures are non-suspicious. Surgical clips in the included right abdomen compatible with cholecystectomy. IMPRESSION: LEFT lung base atelectasis/scarring. Electronically Signed   By: Elon Alas M.D.   On: 06/25/2016 20:19    Procedures Procedures (including critical care time)  Medications Ordered in ED Medications  HYDROcodone-acetaminophen (NORCO/VICODIN) 5-325 MG per tablet 1 tablet (not administered)  amLODipine (NORVASC) tablet 10 mg (not administered)  atorvastatin (LIPITOR) tablet 40 mg (not administered)  ALPRAZolam (XANAX) tablet 0.5 mg (0.5 mg Oral Given 06/26/16 0248)  baclofen (LIORESAL) tablet 10 mg (not administered)  montelukast (SINGULAIR) tablet 10 mg (not administered)  fluticasone furoate-vilanterol (BREO ELLIPTA) 100-25 MCG/INH 1 puff (not administered)  fluticasone (FLONASE) 50 MCG/ACT nasal spray 2 spray (not administered)  nitroGLYCERIN (NITROSTAT) SL tablet 0.4 mg (not administered)  ipratropium-albuterol (DUONEB) 0.5-2.5 (3) MG/3ML nebulizer solution 3 mL (not administered)  guaiFENesin (MUCINEX) 12 hr tablet 600 mg (not administered)  pantoprazole (PROTONIX) EC tablet 40 mg (not administered)  meclizine (ANTIVERT) tablet  25 mg (not administered)  losartan (COZAAR) tablet 50 mg (not administered)  metoprolol succinate (TOPROL-XL) 24 hr tablet 25 mg (not administered)  acetaminophen (TYLENOL) tablet 650 mg (not administered)  ondansetron (ZOFRAN) injection 4 mg (not administered)  enoxaparin (LOVENOX) injection 40 mg (not administered)  ipratropium-albuterol (DUONEB) 0.5-2.5 (3) MG/3ML nebulizer solution 3 mL (not administered)  gi cocktail (Maalox,Lidocaine,Donnatal) (30 mLs Oral Given 06/26/16 0007)  ipratropium-albuterol (DUONEB) 0.5-2.5 (3) MG/3ML nebulizer solution 3 mL (3 mLs Nebulization Given 06/26/16 0330)     Initial Impression / Assessment and Plan / ED Course  I have  reviewed the triage vital signs and the nursing notes.  Pertinent labs & imaging results that were available during my care of the patient were reviewed by me and considered in my medical decision making (see chart for details).  Clinical Course    Final Clinical Impressions(s) / ED Diagnoses   Final diagnoses:  Chest pain, unspecified chest pain type   Labs:Troponin, BMP, CBC  Imaging: DG chest 2 view  Consults:  Therapeutics:  Discharge Meds:   Assessment/Plan:  65 year old male presents today with chest pain. Patient describes the pain as pressure and sharp. He has significant risk factors with a heart score of 5. Concern for cardiac origin, patient will likely need hospital admission for ACS rule out and cardiology consult. Hospitalist agreed for hospital admission ACS rule out. Patient has remained stable while here in the ED.   New Prescriptions Current Discharge Medication List    I personally performed the services described in this documentation, which was scribed in my presence. The recorded information has been reviewed and is accurate.       Okey Regal, PA-C 06/26/16 0543    Veatrice Kells, MD 06/26/16 [phone]

## 2016-06-26 ENCOUNTER — Observation Stay (HOSPITAL_BASED_OUTPATIENT_CLINIC_OR_DEPARTMENT_OTHER): Payer: Medicare Other

## 2016-06-26 ENCOUNTER — Ambulatory Visit: Payer: Self-pay | Admitting: Dietician

## 2016-06-26 DIAGNOSIS — Z87891 Personal history of nicotine dependence: Secondary | ICD-10-CM | POA: Diagnosis not present

## 2016-06-26 DIAGNOSIS — J302 Other seasonal allergic rhinitis: Secondary | ICD-10-CM | POA: Diagnosis not present

## 2016-06-26 DIAGNOSIS — G4733 Obstructive sleep apnea (adult) (pediatric): Secondary | ICD-10-CM | POA: Diagnosis not present

## 2016-06-26 DIAGNOSIS — I10 Essential (primary) hypertension: Secondary | ICD-10-CM

## 2016-06-26 DIAGNOSIS — R0789 Other chest pain: Secondary | ICD-10-CM

## 2016-06-26 DIAGNOSIS — E669 Obesity, unspecified: Secondary | ICD-10-CM

## 2016-06-26 DIAGNOSIS — E782 Mixed hyperlipidemia: Secondary | ICD-10-CM | POA: Diagnosis not present

## 2016-06-26 DIAGNOSIS — R079 Chest pain, unspecified: Secondary | ICD-10-CM

## 2016-06-26 DIAGNOSIS — K219 Gastro-esophageal reflux disease without esophagitis: Secondary | ICD-10-CM | POA: Diagnosis not present

## 2016-06-26 DIAGNOSIS — G14 Postpolio syndrome: Secondary | ICD-10-CM | POA: Diagnosis not present

## 2016-06-26 DIAGNOSIS — J441 Chronic obstructive pulmonary disease with (acute) exacerbation: Secondary | ICD-10-CM | POA: Diagnosis not present

## 2016-06-26 DIAGNOSIS — E119 Type 2 diabetes mellitus without complications: Secondary | ICD-10-CM | POA: Diagnosis not present

## 2016-06-26 DIAGNOSIS — J449 Chronic obstructive pulmonary disease, unspecified: Secondary | ICD-10-CM

## 2016-06-26 DIAGNOSIS — E1169 Type 2 diabetes mellitus with other specified complication: Secondary | ICD-10-CM

## 2016-06-26 DIAGNOSIS — F419 Anxiety disorder, unspecified: Secondary | ICD-10-CM | POA: Diagnosis not present

## 2016-06-26 LAB — TROPONIN I
Troponin I: <0.03 ng/mL (ref <0.03)
Troponin I: <0.03 ng/mL (ref <0.03)

## 2016-06-26 LAB — ECHOCARDIOGRAM COMPLETE
Height: 72 in
Weight: 3612.02 oz

## 2016-06-26 MED ORDER — ALPRAZOLAM 0.5 MG PO TABS
0.5000 mg | ORAL_TABLET | Freq: Three times a day (TID) | ORAL | Status: DC | PRN
  Administered 2016-06-26: 0.5 mg via ORAL
  Filled 2016-06-26: qty 1

## 2016-06-26 MED ORDER — METOPROLOL SUCCINATE ER 25 MG PO TB24
25.0000 mg | ORAL_TABLET | Freq: Every day | ORAL | Status: DC
  Administered 2016-06-26: 25 mg via ORAL
  Filled 2016-06-26: qty 1

## 2016-06-26 MED ORDER — AMLODIPINE BESYLATE 10 MG PO TABS
10.0000 mg | ORAL_TABLET | Freq: Every day | ORAL | Status: DC
  Administered 2016-06-26: 10 mg via ORAL
  Filled 2016-06-26: qty 1

## 2016-06-26 MED ORDER — MECLIZINE HCL 25 MG PO TABS: 25.0000 mg | ORAL_TABLET | Freq: Three times a day (TID) | ORAL | Status: DC | PRN

## 2016-06-26 MED ORDER — IPRATROPIUM-ALBUTEROL 0.5-2.5 (3) MG/3ML IN SOLN: 3.0000 mL | RESPIRATORY_TRACT | Status: DC | PRN

## 2016-06-26 MED ORDER — ENOXAPARIN SODIUM 40 MG/0.4ML ~~LOC~~ SOLN
40.0000 mg | SUBCUTANEOUS | Status: DC
  Administered 2016-06-26: 40 mg via SUBCUTANEOUS
  Filled 2016-06-26: qty 0.4

## 2016-06-26 MED ORDER — HYDROCODONE-ACETAMINOPHEN 5-325 MG PO TABS: 1.0000 | ORAL_TABLET | Freq: Four times a day (QID) | ORAL | Status: DC | PRN

## 2016-06-26 MED ORDER — IPRATROPIUM-ALBUTEROL 0.5-2.5 (3) MG/3ML IN SOLN
3.0000 mL | Freq: Two times a day (BID) | RESPIRATORY_TRACT | Status: DC
  Administered 2016-06-26: 3 mL via RESPIRATORY_TRACT
  Filled 2016-06-26: qty 3

## 2016-06-26 MED ORDER — ATORVASTATIN CALCIUM 40 MG PO TABS: 40.0000 mg | ORAL_TABLET | Freq: Every day | ORAL | Status: DC

## 2016-06-26 MED ORDER — FLUTICASONE FUROATE-VILANTEROL 100-25 MCG/INH IN AEPB
1.0000 | INHALATION_SPRAY | Freq: Every day | RESPIRATORY_TRACT | Status: DC
  Administered 2016-06-26: 1 via RESPIRATORY_TRACT
  Filled 2016-06-26: qty 28

## 2016-06-26 MED ORDER — IPRATROPIUM-ALBUTEROL 0.5-2.5 (3) MG/3ML IN SOLN
3.0000 mL | Freq: Once | RESPIRATORY_TRACT | Status: AC
  Administered 2016-06-26: 3 mL via RESPIRATORY_TRACT
  Filled 2016-06-26: qty 3

## 2016-06-26 MED ORDER — BACLOFEN 10 MG PO TABS
10.0000 mg | ORAL_TABLET | Freq: Every day | ORAL | Status: DC | PRN
  Filled 2016-06-26: qty 1

## 2016-06-26 MED ORDER — GUAIFENESIN ER 600 MG PO TB12
600.0000 mg | ORAL_TABLET | Freq: Two times a day (BID) | ORAL | Status: DC
  Administered 2016-06-26: 600 mg via ORAL
  Filled 2016-06-26: qty 1

## 2016-06-26 MED ORDER — PANTOPRAZOLE SODIUM 40 MG PO TBEC
40.0000 mg | DELAYED_RELEASE_TABLET | Freq: Every day | ORAL | Status: DC
  Administered 2016-06-26: 40 mg via ORAL
  Filled 2016-06-26: qty 1

## 2016-06-26 MED ORDER — LOSARTAN POTASSIUM 50 MG PO TABS
50.0000 mg | ORAL_TABLET | Freq: Every day | ORAL | Status: DC
  Administered 2016-06-26: 50 mg via ORAL
  Filled 2016-06-26: qty 1

## 2016-06-26 MED ORDER — ACETAMINOPHEN 325 MG PO TABS: 650.0000 mg | ORAL_TABLET | ORAL | Status: DC | PRN

## 2016-06-26 MED ORDER — NITROGLYCERIN 0.4 MG SL SUBL: 0.4000 mg | SUBLINGUAL_TABLET | SUBLINGUAL | Status: DC | PRN

## 2016-06-26 MED ORDER — MONTELUKAST SODIUM 10 MG PO TABS: 10.0000 mg | ORAL_TABLET | Freq: Every evening | ORAL | Status: DC | PRN

## 2016-06-26 MED ORDER — FLUTICASONE PROPIONATE 50 MCG/ACT NA SUSP
2.0000 | Freq: Every day | NASAL | Status: DC
  Administered 2016-06-26: 2 via NASAL
  Filled 2016-06-26: qty 16

## 2016-06-26 MED ORDER — ONDANSETRON HCL 4 MG/2ML IJ SOLN: 4.0000 mg | Freq: Four times a day (QID) | INTRAMUSCULAR | Status: DC | PRN

## 2016-06-26 NOTE — Discharge Summary (Signed)
Physician Discharge Summary  [name] [id number] DOB: 02/24/1951 DOA: 06/25/2016  PCP: [name], DO  Admit date: 06/25/2016 Discharge date: 06/26/2016  Recommendations for Outpatient Follow-up:  1. No changes in medications on discharge. 2. Patient informed of cardiac enzymes and 2-D echo results and instructed to follow-up with primary care physician in next 2-4 weeks after discharge to make sure symptoms stable  Discharge Diagnoses:  Principal Problem:   non-specific Chest pain Active Problems:   COPD (chronic obstructive pulmonary disease) ([place])   Hypertension   Hyperlipidemia, mixed   Diabetes mellitus type 2 in obese [name])    Discharge Condition: stable   Diet recommendation: as tolerated   History of present illness:   Per history of present illness 06/26/2016 " 65 y.o. male with medical history significant of  COPD, HTN, HLD, DM type 2,  anxiety, post polio syndrome, and history of prostate cancer; who presents with complaints of chest pain. History is obtained from patient who is a fair historian. Symptoms have been intermittently occurring for the last 1-2 months. He describes pain as a sharp stabbing pain on the left sternal border. Symptoms have usually occurred while exerting himself. Associated symptoms include feeling jittery, sweaty, and intermittent shortness of breath. He states pain can be reproduced with lifting his arms or by lifting things that he shouldn't. Patient states that he's been outside clearing up the yard picking up objects that may weight up to 50 pounds in the chest pains have occurred. He normally will take a hydrocodone with relief of symptoms or his Dexilant as he thinks it may be acid reflux sometimes. Otherwise patient makes note that he has COPD and allergies. Patient makes note that he uses his Breo inhaler as instructed, but has also been requiring his albuterol rescue inhaler up to 4 times a day. He notes that his last  hemoglobin A1c was 5.2 approximately 3 weeks ago. The patient was previously evaluated over back in 05/2014 with a stress test which was noted to be normal. In 08/2014, the patient had a echocardiogram showed EF of 55-60 % with mild concentric hypertrophy, but no significant wall abnormalities. Patient reports that he quit smoking approximately 3 years ago."  Hospital Course:   Chest pain - ACS ruled out - Trop x 3 negative  - 2 D ECHO with normal EF  Essential hypertension - Resume home medications   Signed:  [name], MD  [facility] 06/26/2016, 12:25 PM  Pager #: [phone]  Time spent in minutes: less than 30 minutes    Discharge Exam: Vitals:   06/26/16 0900 06/26/16 1031  BP: 122/66   Pulse: (!) 57 65  Resp:  16  Temp:     Vitals:   06/26/16 0330 06/26/16 0553 06/26/16 0900 06/26/16 1031  BP:  113/63 122/66   Pulse:  (!) 59 (!) 57 65  Resp:  20  16  Temp:  97.8 F (36.6 C)    TempSrc:  Oral    SpO2: 99% 100% 99% 98%  Weight:      Height:        General: Pt is alert, follows commands appropriately, not in acute distress Cardiovascular: Regular rate and rhythm, S1/S2 +, no murmurs Respiratory: Clear to auscultation bilaterally, no wheezing, no crackles, no rhonchi Abdominal: Soft, non tender, non distended, bowel sounds +, no guarding Extremities: no edema, no cyanosis, pulses palpable bilaterally DP and PT Neuro: Grossly nonfocal  Discharge Instructions  Discharge Instructions    Call  MD for:  difficulty breathing, headache or visual disturbances    Complete by:  As directed   Call MD for:  persistant nausea and vomiting    Complete by:  As directed   Call MD for:  redness, tenderness, or signs of infection (pain, swelling, redness, odor or green/yellow discharge around incision site)    Complete by:  As directed   Call MD for:  severe uncontrolled pain    Complete by:  As directed   Diet - low sodium heart healthy    Complete by:  As  directed   Increase activity slowly    Complete by:  As directed       Medication List    TAKE these medications   ACCU-CHEK AVIVA device Use to check blood sugars every morning fasting, 2 hours after largest meal and as needed with low blood sugar symptoms   accu-chek softclix lancets Check blood sugars every morning fasting, 2 hours after largest meal and as needed with low blood sugar symptoms   albuterol 108 (90 Base) MCG/ACT inhaler Commonly known as:  PROVENTIL HFA;VENTOLIN HFA Inhale 2 puffs into the lungs every 6 (six) hours as needed for wheezing or shortness of breath.   ALPRAZolam 1 MG tablet Commonly known as:  XANAX Take 0.5 tablets (0.5 mg total) by mouth 3 (three) times daily as needed for anxiety.   amLODipine 10 MG tablet Commonly known as:  NORVASC Take 1 tablet (10 mg total) by mouth daily.   atorvastatin 40 MG tablet Commonly known as:  LIPITOR One tab q hs   baclofen 10 MG tablet Commonly known as:  LIORESAL Take 1 tablet (10 mg total) by mouth 2 (two) times daily. What changed:  when to take this  reasons to take this   BREO ELLIPTA 100-25 MCG/INH Aepb Generic drug:  fluticasone furoate-vilanterol INHALE 1 PUFF BY MOUTH EVERY MORNING   clindamycin 300 MG capsule Commonly known as:  CLEOCIN Take 1 capsule (300 mg total) by mouth 4 (four) times daily. X 7 days   dexlansoprazole 60 MG capsule Commonly known as:  DEXILANT Take 1 capsule (60 mg total) by mouth daily. What changed:  when to take this   EPINEPHrine 0.3 mg/0.3 mL Soaj injection Commonly known as:  EPI-PEN Inject 0.3 mLs (0.3 mg total) into the muscle once.   fluticasone 50 MCG/ACT nasal spray Commonly known as:  FLONASE Place 2 sprays into both nostrils daily.   gabapentin 300 MG capsule Commonly known as:  NEURONTIN Take 1 capsule (300 mg total) by mouth 3 (three) times daily as needed.   glucose blood test strip Commonly known as:  ACCU-CHEK AVIVA Check blood sugars  every morning fasting, 2 hours after largest meal and as needed with low blood sugar symptoms   guaiFENesin 600 MG 12 hr tablet Commonly known as:  MUCINEX Take 1 tablet (600 mg total) by mouth 2 (two) times daily. What changed:  when to take this  reasons to take this   HYDROcodone-acetaminophen 5-325 MG tablet Commonly known as:  NORCO/VICODIN Take 1 tablet by mouth every 6 (six) hours as needed for moderate pain.   ipratropium-albuterol 0.5-2.5 (3) MG/3ML Soln Commonly known as:  DUONEB Take 3 mLs by nebulization every 4 (four) hours as needed (shortness of breath and wheezing).   losartan 50 MG tablet Commonly known as:  COZAAR Take one tab q am for BP   meclizine 25 MG tablet Commonly known as:  ANTIVERT Take 25 mg by  mouth 3 (three) times daily as needed for dizziness.   metoprolol succinate 25 MG 24 hr tablet Commonly known as:  TOPROL-XL Take 1 tablet (25 mg total) by mouth daily.   montelukast 10 MG tablet Commonly known as:  SINGULAIR TAKE 1 TABLET (10 MG TOTAL) BY MOUTH AT BEDTIME. What changed:  how much to take  how to take this  when to take this  reasons to take this  additional instructions   nitroGLYCERIN 0.4 MG SL tablet Commonly known as:  NITROSTAT Place 1 tablet (0.4 mg total) under the tongue every 5 (five) minutes as needed for chest pain.   ondansetron 8 MG tablet Commonly known as:  ZOFRAN Take 8 mg by mouth daily as needed.   OXYGEN Inhale 2 L into the lungs at bedtime. Reported on 11/25/2015   oxymetazoline 0.05 % nasal spray Commonly known as:  AFRIN Place 1 spray into both nostrils 4 (four) times daily as needed for congestion.      Follow-up Information    [name], DO. Schedule an appointment as soon as possible for a visit in 3 week(s).   Specialty:  Family Medicine Contact information: [place] [address] [phone]            The results of significant diagnostics from this  hospitalization (including imaging, microbiology, ancillary and laboratory) are listed below for reference.    Significant Diagnostic Studies: Dg Chest 2 View  Result Date: 06/25/2016 CLINICAL DATA:  Chest pain for 3 days.  History of COPD. EXAM: CHEST  2 VIEW COMPARISON:  Chest radiograph November 21, 2015 FINDINGS: Cardiomediastinal silhouette is normal. No pleural effusions or focal consolidations. Minimal strandy densities LEFT lung base. Trachea projects midline and there is no pneumothorax. Minimal apical pleural thickening. Soft tissue planes and included osseous structures are non-suspicious. Surgical clips in the included right abdomen compatible with cholecystectomy. IMPRESSION: LEFT lung base atelectasis/scarring. Electronically Signed   By: Elon Alas M.D.   On: 06/25/2016 20:19   Ct Head Wo Contrast  Result Date: 06/02/2016 CLINICAL DATA:  Headache.  History of prostate cancer. EXAM: CT HEAD WITHOUT CONTRAST TECHNIQUE: Contiguous axial images were obtained from the base of the skull through the vertex without intravenous contrast. COMPARISON:  Head CT 12/03/2015 FINDINGS: Brain: No mass, hemorrhage or extra-axial collection. No midline shift. The basal ganglia and insular ribbons are preserved. Gray-white differentiation is maintained. Vascular: No hyperdense vessel sign. Skull: Normal Sinuses/Orbits: Frothy secretions and fluid within the right maxillary sinus and small fluid level within the left maxillary sinus. No mastoid effusion. Other: Normal extracranial soft tissues. IMPRESSION: 1. No acute intracranial abnormality. 2. Bilateral maxillary sinus fluid levels. Correlate clinically for signs of acute sinusitis. Electronically Signed   By: Ulyses Jarred M.D.   On: 06/02/2016 01:05    Microbiology: No results found for this or any previous visit (from the past 240 hour(s)).   Labs: Basic Metabolic Panel:  Recent Labs Lab 06/25/16 2011  NA 137  K 4.2  CL 105  CO2 26   GLUCOSE 147*  BUN 18  CREATININE 1.32*  CALCIUM 9.4   Liver Function Tests: No results for input(s): AST, ALT, ALKPHOS, BILITOT, PROT, ALBUMIN in the last 168 hours. No results for input(s): LIPASE, AMYLASE in the last 168 hours. No results for input(s): AMMONIA in the last 168 hours. CBC:  Recent Labs Lab 06/25/16 2011  WBC 8.1  HGB 13.9  HCT 41.0  MCV 93.6  PLT 269  Cardiac Enzymes:  Recent Labs Lab 06/25/16 2333 06/26/16 0528  TROPONINI <0.03 <0.03   BNP: BNP (last 3 results) No results for input(s): BNP in the last 8760 hours.  ProBNP (last 3 results) No results for input(s): PROBNP in the last 8760 hours.  CBG: No results for input(s): GLUCAP in the last 168 hours.

## 2016-06-26 NOTE — H&P (Signed)
History and Physical    HAZIEL ORZECH [id number] DOB: [birth year] DOA: 06/25/2016  Referring MD/NP/PA: Okey Regal, PA-C PCP: [name], DO  Patient coming from: Home  Chief Complaint: Chest pain  HPI: [name] is a 65 y.o. male with medical history significant of  COPD, HTN, HLD, DM type 2,  anxiety, post polio syndrome, and history of prostate cancer; who presents with complaints of chest pain. History is obtained from patient who is a fair historian. Symptoms have been intermittently occurring for the last 1-2 months. He describes pain as a sharp stabbing pain on the left sternal border. Symptoms have usually occurred while exerting himself. Associated symptoms include feeling jittery, sweaty, and intermittent shortness of breath. He states pain can be reproduced with lifting his arms or by lifting things that he shouldn't. Patient states that he's been outside clearing up the yard picking up objects that may weight up to 50 pounds in the chest pains have occurred. He normally will take a hydrocodone with relief of symptoms or his Dexilant as he thinks it may be acid reflux sometimes. Otherwise patient makes note that he has COPD and allergies. Patient makes note that he uses his Breo inhaler as instructed, but has also been requiring his albuterol rescue inhaler up to 4 times a day. He notes that his last hemoglobin A1c was 5.2 approximately 3 weeks ago. The patient was previously evaluated over back in 05/2014 with a stress test which was noted to be normal. In 08/2014, the patient had a echocardiogram showed EF of 55-60 % with mild concentric hypertrophy, but no significant wall abnormalities. Patient reports that he quit smoking approximately 3 years ago.  ED Course: Initial troponins were negative and EKG showed no signs of ischemic changes. TRH called for chest pain rule out given risk factors.  Review of Systems: As per HPI otherwise 10 point review of systems negative.     Past Medical History:  Diagnosis Date  . Anxiety   . Anxiety disorder   . Arthritis    BILATERAL SHOULDERS, ELBOWS AND HANDS AND LEFT HIP AND KNEES--HX CORTISONE SHOTS IN SHOULDERS, ELBOWS, HIP AND KNEES  . Basal cell carcinoma of back 04/30/2016   Dermatologist- Dr Nevada Crane;   MOHS sx- Dr Levada Dy   . Bladder outlet obstruction   . Chronic idiopathic constipation 05/08/2015  . Complication of anesthesia    DIFFICULT WAKING   . COPD (chronic obstructive pulmonary disease) Gold C Frequent exacerbations 01/13/2014   [name] 07/08/14: FeV1 51% FeV1/FVC 66% FVC 59% 10/5/2015ONO RA was normal  10/13/2014  ONO on RA NORMAL   . COPD, frequent exacerbations ([place])    pulmologist-  dr Joya Gaskins--  [place] Rm Stage C.04-25-15 recent COPD exacerbation-much improved now, after tx. in ER [facility].  Marland Kitchen Depression   . Diabetes mellitus without complication ([place])    BODERLINE - DIET CONTROL  . Dysrhythmia    PVC'S  . Family history of adverse reaction to anesthesia    father would wake up with agitation   . Former smoker 01/13/2014  . Gastroesophageal reflux disease without esophagitis   . GERD (gastroesophageal reflux disease)   . Heavy alcohol use 04/30/2016  . History of chronic bronchitis   . History of oxygen administration    oxygen use 2 l/m nasally at bedtime and exertional occasions  . History of rheumatic fever   . History of TB (tuberculosis)    1984--  hospitalized for 4 month treatment  . History of urinary retention   .  Hx of multiple concussions    x 2 per patient   . Hypertension   . Melanoma ([place])   . Nocturnal oxygen desaturation    USES O2 NIGHTLY  . OSA (obstructive sleep apnea) 01/13/2014  . Pneumonia    hx of   . PONV (postoperative nausea and vomiting)   . Post-polio syndrome    polio at age 68--PT WAS IN IRON LUNG; PT WAS IN W/C UNTIL AGE 23; STILL HAS WEAKNESS RIGHT SIDE  . Prostate cancer ([place])   . Prostate cancer ([place])   . Schizophrenia ([place])   . Shortness of breath dyspnea     RIGHT HEMIDIAPHRAGM ELEVATION - POST POLIO SYNDROME  . Tuberculosis    Hosp 4 months rx , left early     Past Surgical History:  Procedure Laterality Date  . CARDIOVASCULAR STRESS TEST  06-08-2014  dr Mare Ferrari   normal lexiscan study/  no ischemia/  not gated due to PAC's  . COLONOSCOPY N/A 05/03/2015   Procedure: COLONOSCOPY;  Surgeon: [name], MD;  Location: [facility];  Service: Endoscopy;  Laterality: N/A;  . CYSTOSCOPY N/A 10/25/2015   Procedure: CYSTOSCOPY;  Surgeon: [name], MD;  Location: [facility];  Service: Urology;  Laterality: N/A;  . CYSTOSCOPY W/ CYSTOGRAM/  TRANSRECTAL ULTRASOUND PROSTATE BX  03-22-2009  . ESOPHAGOGASTRODUODENOSCOPY N/A 03/22/2015   Procedure: ESOPHAGOGASTRODUODENOSCOPY (EGD) with dilation;  Surgeon: [name], MD;  Location: [facility];  Service: Endoscopy;  Laterality: N/A;  . excision of skin lesion    . LAPAROSCOPIC CHOLECYSTECTOMY  2013  . left elbow surgery      due to fracture   . NASAL SEPTUM SURGERY  2000  . OTHER SURGICAL HISTORY      Muscle & bone Graft/Polio  . polio surgeries      14 polio surgeries   . PROSTATE BIOPSY N/A 09/28/2014   Procedure: PROSTATE ULTRASOUND/BIOPSY;  Surgeon: [name], MD;  Location: [facility];  Service: Urology;  Laterality: N/A;  . PROSTATE BIOPSY N/A 10/25/2015   Procedure: PROSTATE BIOPSY AND ULTRASOUND;  Surgeon: [name], MD;  Location: [facility];  Service: Urology;  Laterality: N/A;  . SAVORY DILATION N/A 03/22/2015   Procedure: SAVORY DILATION;  Surgeon: [name], MD;  Location: [facility];  Service: Endoscopy;  Laterality: N/A;  . SHOULDER ARTHROSCOPY WITH OPEN ROTATOR CUFF REPAIR Bilateral 2013  &  1999   removal spurs and bursectomy  . TRANSURETHRAL INCISION OF BLADDER NECK N/A 10/25/2015   Procedure:  TRANSURETHRAL INCISION OF BLADDER NECK;  Surgeon: [name], MD;  Location: [facility];  Service: Urology;  Laterality: N/A;  . TRANSURETHRAL RESECTION OF PROSTATE N/A 09/28/2014   Procedure:  TRANSURETHRAL RESECTION OF THE PROSTATE (TURP);  Surgeon: [name], MD;  Location: [facility];  Service: Urology;  Laterality: N/A;  . URETEROSOPY STONE EXTRACTION  2000     reports that he quit smoking about 2 years ago. He quit after 47.00 years of use. He has quit using smokeless tobacco. He reports that he drinks about 3.6 oz of alcohol per week . He reports that he does not use drugs.  Allergies  Allergen Reactions  . Ivp Dye [Iodinated Diagnostic Agents] Anaphylaxis  . Levaquin [Levofloxacin In D5w] Shortness Of Breath and Swelling    In addition: sweating, chest pain, and diarrhea.   . Penicillins Anaphylaxis    Heart stops Has patient had a PCN reaction causing immediate rash, facial/tongue/throat swelling, SOB or lightheadedness with hypotension:  yes Has patient had a PCN reaction causing severe rash involving mucus membranes or skin necrosis: yes Has patient had a PCN reaction that required hospitalization yes Has patient had a PCN reaction occurring within the last 10 years: No If all of the above answers are "NO", then may proceed with Cephalosporin use.   . Aspirin Other (See Comments)    Reaction unknown  . Nsaids Other (See Comments)    Difficulty breathing  . Tolmetin Other (See Comments)    Difficulty breathing  . Buprenorphine Hcl Nausea And Vomiting    Can take with zofran   . Morphine And Related Nausea And Vomiting    Can take with zofran   . Oxycodone Itching and Rash    Family History  Problem Relation Age of Onset  . Alzheimer's disease Father 4    Deceased  . Stomach cancer Father   . Heart attack Father   . Heart disease Father   . Skin cancer Mother     Facial-Living  . Cancer Mother     skin  . Alcohol abuse Sister     x2  . Mental illness Sister     x2  . Alcohol abuse Sister   . Diabetes Maternal Aunt     x2  . Thyroid disease Maternal Aunt     x4  . Diabetes Maternal Uncle   . Tuberculosis Paternal Grandfather   . Tuberculosis  Paternal Grandmother   . Alzheimer's disease Paternal Aunt   . Alzheimer's disease Paternal Uncle   . Colon cancer Neg Hx   . Colon polyps Neg Hx   . Crohn's disease Neg Hx   . Ulcerative colitis Neg Hx     Prior to Admission medications   Medication Sig Start Date End Date Taking? Authorizing Provider  albuterol (PROVENTIL HFA;VENTOLIN HFA) 108 (90 Base) MCG/ACT inhaler Inhale 2 puffs into the lungs every 6 (six) hours as needed for wheezing or shortness of breath. 10/20/15  Yes Brunetta Jeans, PA-C  ALPRAZolam [name]) 1 MG tablet Take 0.5 tablets (0.5 mg total) by mouth 3 (three) times daily as needed for anxiety. 04/11/16  Yes Debbrah Alar, NP  amLODipine (NORVASC) 10 MG tablet Take 1 tablet (10 mg total) by mouth daily. 06/06/16  Yes [name], DO  atorvastatin (LIPITOR) 40 MG tablet One tab q hs 05/28/16  Yes Deborah Opalski, DO  baclofen (LIORESAL) 10 MG tablet Take 1 tablet (10 mg total) by mouth 2 (two) times daily. Patient taking differently: Take 10 mg by mouth daily as needed (for leg cramps).  04/03/16  Yes Brunetta Jeans, PA-C  Blood Glucose Monitoring Suppl (ACCU-CHEK AVIVA) device Use to check blood sugars every morning fasting, 2 hours after largest meal and as needed with low blood sugar symptoms 06/12/16 06/12/17 Yes Deborah Opalski, DO  BREO ELLIPTA 100-25 MCG/INH AEPB INHALE 1 PUFF BY MOUTH EVERY MORNING 03/18/16  Yes Brunetta Jeans, PA-C  dexlansoprazole (DEXILANT) 60 MG capsule Take 1 capsule (60 mg total) by mouth daily. Patient taking differently: Take 60 mg by mouth every other day.  12/21/15  Yes Brunetta Jeans, PA-C  glucose blood (ACCU-CHEK AVIVA) test strip Check blood sugars every morning fasting, 2 hours after largest meal and as needed with low blood sugar symptoms 06/12/16  Yes Deborah Opalski, DO  guaiFENesin (MUCINEX) 600 MG 12 hr tablet Take 1 tablet (600 mg total) by mouth 2 (two) times daily. Patient taking differently: Take 600 mg by mouth 2 (two)  times daily as needed for cough or to loosen phlegm.  11/23/15  Yes Geradine Girt, DO  HYDROcodone-acetaminophen (NORCO/VICODIN) 5-325 MG tablet Take 1 tablet by mouth every 6 (six) hours as needed for moderate pain.   Yes Historical Provider, MD  ipratropium-albuterol (DUONEB) 0.5-2.5 (3) MG/3ML SOLN Take 3 mLs by nebulization every 4 (four) hours as needed (shortness of breath and wheezing).   Yes Historical Provider, MD  Lancet Devices [facility]) lancets Check blood sugars every morning fasting, 2 hours after largest meal and as needed with low blood sugar symptoms 06/12/16  Yes [name], DO  losartan (COZAAR) 50 MG tablet Take one tab q am for BP 05/28/16  Yes Deborah Opalski, DO  meclizine (ANTIVERT) 25 MG tablet Take 25 mg by mouth 3 (three) times daily as needed for dizziness.   Yes Historical Provider, MD  metoprolol succinate (TOPROL-XL) 25 MG 24 hr tablet Take 1 tablet (25 mg total) by mouth daily. 04/02/16  Yes Brunetta Jeans, PA-C  montelukast (SINGULAIR) 10 MG tablet TAKE 1 TABLET (10 MG TOTAL) BY MOUTH AT BEDTIME. Patient taking differently: Take 10 mg by mouth at bedtime as needed (for allergies.).  04/02/16  Yes Brunetta Jeans, PA-C  nitroGLYCERIN (NITROSTAT) 0.4 MG SL tablet Place 1 tablet (0.4 mg total) under the tongue every 5 (five) minutes as needed for chest pain. 06/21/15  Yes Brunetta Jeans, PA-C  ondansetron (ZOFRAN) 8 MG tablet Take 8 mg by mouth daily as needed.  02/21/16  Yes Historical Provider, MD  OXYGEN Inhale 2 L into the lungs at bedtime. Reported on 11/25/2015   Yes Historical Provider, MD  oxymetazoline (AFRIN) 0.05 % nasal spray Place 1 spray into both nostrils 4 (four) times daily as needed for congestion.   Yes Historical Provider, MD  clindamycin (CLEOCIN) 300 MG capsule Take 1 capsule (300 mg total) by mouth 4 (four) times daily. X 7 days Patient not taking: Reported on 06/12/2016 06/02/16   April Palumbo, MD  EPINEPHrine 0.3 mg/0.3 mL IJ SOAJ  injection Inject 0.3 mLs (0.3 mg total) into the muscle once. Patient not taking: Reported on 06/26/2016 02/24/16   Malvin Johns, MD  fluticasone (FLONASE) 50 MCG/ACT nasal spray Place 2 sprays into both nostrils daily. Patient not taking: Reported on 06/26/2016 08/03/15   Brunetta Jeans, PA-C  gabapentin (NEURONTIN) 300 MG capsule Take 1 capsule (300 mg total) by mouth 3 (three) times daily as needed. Patient not taking: Reported on 06/12/2016 04/02/16   Brunetta Jeans, PA-C    Physical Exam:  Constitutional: NAD, calm, comfortable Vitals:   06/25/16 1950 06/25/16 2254 06/25/16 2300 06/26/16 0111  BP: 142/72 121/71 122/56 121/72  Pulse: 63 60 (!) 56 (!) 55  Resp: 17 17 16 18   Temp: 98.3 F (36.8 C)     TempSrc: Oral     SpO2: 99% 100% 100% 100%   Eyes: PERRL, lids and conjunctivae normal ENMT: Mucous membranes are moist. Posterior pharynx clear of any exudate or lesions.Normal dentition.  Neck: normal, supple, no masses, no thyromegaly Respiratory: Decreased overall air movement, but clear to auscultation bilaterally, no wheezing, no crackles. Normal respiratory effort. No accessory muscle use. Patient currently on room air Cardiovascular: Regular rate and rhythm, no murmurs / rubs / gallops. No extremity edema. 2+ pedal pulses. No carotid bruits. Chest pain reproducible with palpation of the left sternal border. Abdomen: no tenderness, no masses palpated. No hepatosplenomegaly. Bowel sounds positive.  Musculoskeletal: no clubbing / cyanosis. No  joint deformity upper and lower extremities. Good ROM, no contractures. Normal muscle tone.  Skin: no rashes, lesions, ulcers. No induration Neurologic: CN 2-12 grossly intact. Sensation intact, DTR normal. Strength 5/5 in all 4.  Psychiatric: Normal judgment and insight. Alert and oriented x 3. Anxious mood.     Labs on Admission: I have personally reviewed following labs and imaging studies  CBC:  Recent Labs Lab 06/25/16 2011  WBC  8.1  HGB 13.9  HCT 41.0  MCV 93.6  PLT [id number]   Basic Metabolic Panel:  Recent Labs Lab 06/25/16 2011  NA 137  K 4.2  CL 105  CO2 26  GLUCOSE 147*  BUN 18  CREATININE 1.32*  CALCIUM 9.4   GFR: Estimated Creatinine Clearance: 66.7 mL/min (by C-G formula based on SCr of 1.32 mg/dL). Liver Function Tests: No results for input(s): AST, ALT, ALKPHOS, BILITOT, PROT, ALBUMIN in the last 168 hours. No results for input(s): LIPASE, AMYLASE in the last 168 hours. No results for input(s): AMMONIA in the last 168 hours. Coagulation Profile: No results for input(s): INR, PROTIME in the last 168 hours. Cardiac Enzymes:  Recent Labs Lab 06/25/16 2333  TROPONINI <0.03   BNP (last 3 results) No results for input(s): PROBNP in the last 8760 hours. HbA1C: No results for input(s): HGBA1C in the last 72 hours. CBG: No results for input(s): GLUCAP in the last 168 hours. Lipid Profile: No results for input(s): CHOL, HDL, LDLCALC, TRIG, CHOLHDL, LDLDIRECT in the last 72 hours. Thyroid Function Tests: No results for input(s): TSH, T4TOTAL, FREET4, T3FREE, THYROIDAB in the last 72 hours. Anemia Panel: No results for input(s): VITAMINB12, FOLATE, FERRITIN, TIBC, IRON, RETICCTPCT in the last 72 hours. Urine analysis:    Component Value Date/Time   COLORURINE YELLOW 12/03/2015 1645   APPEARANCEUR CLOUDY (A) 12/03/2015 1645   LABSPEC 1.023 12/03/2015 1645   PHURINE 5.0 12/03/2015 1645   GLUCOSEU NEGATIVE 12/03/2015 1645   GLUCOSEU NEGATIVE 06/29/2014 1318   HGBUR MODERATE (A) 12/03/2015 [place] 12/03/2015 1645   BILIRUBINUR neg 02/07/2015 1205   KETONESUR NEGATIVE 12/03/2015 1645   PROTEINUR 30 (A) 12/03/2015 1645   UROBILINOGEN 0.2 02/07/2015 1205   UROBILINOGEN 0.2 02/03/2015 1940   NITRITE NEGATIVE 12/03/2015 1645   LEUKOCYTESUR MODERATE (A) 12/03/2015 1645   Sepsis Labs: No results found for this or any previous visit (from the past 240 hour(s)).    Radiological Exams on Admission: Dg Chest 2 View  Result Date: 06/25/2016 CLINICAL DATA:  Chest pain for 3 days.  History of COPD. EXAM: CHEST  2 VIEW COMPARISON:  Chest radiograph November 21, 2015 FINDINGS: Cardiomediastinal silhouette is normal. No pleural effusions or focal consolidations. Minimal strandy densities LEFT lung base. Trachea projects midline and there is no pneumothorax. Minimal apical pleural thickening. Soft tissue planes and included osseous structures are non-suspicious. Surgical clips in the included right abdomen compatible with cholecystectomy. IMPRESSION: LEFT lung base atelectasis/scarring. Electronically Signed   By: Elon Alas M.D.   On: 06/25/2016 20:19    EKG: Independently reviewed. Sinus rhythm similar to previous tracing  Assessment/Plan Chest pain: Acute. Heart score is 5. Given history and the reproducible nature on physical exam suspect that symptoms are musculoskeletal in nature. Although would like to make sure there is no underlying cardiac cause of the patient's symptoms. - Admit to a telemetry bed  - Trend cardiac enzymes  - Recheck EKG in a.m. - Check echocardiogram in a.m.   - Patient reports aspirin allergy  COPD/ Question bronchitis: Patient not found to be actively bleeding wheezing on physical exam. - Continue Breo - DuoNeb's q4hrs prn sob/wheezing - Mucinex  Seasonal allergies - Continue Singulair, Flonase  Essential hypertension - Continue amlodipine, losartan, Metoprolol succinate  Diabetes mellitus type 2: Diet controlled. Last hemoglobin A1c 5.9 approximately 3 weeks ago - Continue to monitor   Hyperlipidemia  - continue atorvastatin  GERD - Pharmacy substitution of Protonix for Dexilant - question patient taking Dexilant every other day   DVT prophylaxis: Lovenox  Code Status: Full Family Communication: Discussed overall plan with the patient and wife was present at bedside Disposition Plan: TBD Consults  called: none  Admission status: Telemetry observation  Norval Morton MD [facility] Pager [phone]  If 7PM-7AM, please contact night-coverage [email or web address] Password [email or web address]  06/26/2016, 1:51 AM

## 2016-06-26 NOTE — Discharge Instructions (Signed)

## 2016-06-26 NOTE — Progress Notes (Signed)
  Echocardiogram 2D Echocardiogram has been performed.  Tresa Res 06/26/2016, 11:28 AM

## 2016-06-26 NOTE — Care Management Obs Status (Signed)
[facility] NOTIFICATION   Patient Details  Name: [name] MRN: [id number] Date of Birth: 09/18/1951   Medicare Observation Status Notification Given:  Yes    Dellie Catholic, RN 06/26/2016, 12:39 PM

## 2016-06-27 ENCOUNTER — Ambulatory Visit: Payer: Self-pay | Admitting: Family Medicine

## 2016-06-28 ENCOUNTER — Encounter: Payer: Self-pay | Admitting: Dietician

## 2016-06-28 ENCOUNTER — Encounter: Payer: Medicare Other | Attending: Family Medicine | Admitting: Dietician

## 2016-06-28 DIAGNOSIS — F1010 Alcohol abuse, uncomplicated: Secondary | ICD-10-CM | POA: Insufficient documentation

## 2016-06-28 DIAGNOSIS — F319 Bipolar disorder, unspecified: Secondary | ICD-10-CM | POA: Insufficient documentation

## 2016-06-28 DIAGNOSIS — E119 Type 2 diabetes mellitus without complications: Secondary | ICD-10-CM | POA: Diagnosis not present

## 2016-06-28 DIAGNOSIS — I10 Essential (primary) hypertension: Secondary | ICD-10-CM | POA: Diagnosis not present

## 2016-06-28 DIAGNOSIS — J449 Chronic obstructive pulmonary disease, unspecified: Secondary | ICD-10-CM | POA: Insufficient documentation

## 2016-06-28 DIAGNOSIS — E559 Vitamin D deficiency, unspecified: Secondary | ICD-10-CM | POA: Diagnosis not present

## 2016-06-28 DIAGNOSIS — Z713 Dietary counseling and surveillance: Secondary | ICD-10-CM | POA: Diagnosis present

## 2016-06-28 DIAGNOSIS — E785 Hyperlipidemia, unspecified: Secondary | ICD-10-CM | POA: Insufficient documentation

## 2016-06-28 DIAGNOSIS — K219 Gastro-esophageal reflux disease without esophagitis: Secondary | ICD-10-CM | POA: Diagnosis not present

## 2016-06-28 DIAGNOSIS — F209 Schizophrenia, unspecified: Secondary | ICD-10-CM | POA: Insufficient documentation

## 2016-06-28 NOTE — Progress Notes (Signed)
Diabetes Self-Management Education  Visit Type: First/Initial  Appt. Start Time: 1440 Appt. End Time: 1443  06/28/2016  [name], identified by name and date of birth, is a 65 y.o. male with a diagnosis of Diabetes: Type 2. Other hx includes alcohol abuse (12-18 beer per night now decreased to 4 over the past 2 weeks), COPD, schizophrenia, bipolar, GERD, hyperlipidemia, HTN, Vitamin D deficiency.  States that the Vitamin D hurts his stomach and he refuses to take this.  Labs noted to include cholesterol 241, triglycerides 162, HDL 59, LDL 150, vitamin D 24, AST/ALT 42/59 (05/07/16 and BUN 18, Creatinine 1.32, and EGFR 55 06/25/16.  He reports dehydration at times and does not like water.  He is allergic to strawberries, cantaloupe, watermelon, apples, and oranges which cause a rash and difficulty breathing.  He has not tried other fruits that he likes and is not allergic to.  He has increased portion sizes of candy and cookies.  (sometimes sugar free but still CHO containing).  He lives with his wife who also has type 2 diabetes.  He does the shopping and cooking but skips meals often.  ASSESSMENT  223 lbs       Diabetes Self-Management Education - 06/28/16 1503      Visit Information   Visit Type First/Initial     Initial Visit   Diabetes Type Type 2   Are you currently following a meal plan? No   Are you taking your medications as prescribed? Not on Medications   Date Diagnosed 04/2016  11/2015     Health Coping   How would you rate your overall health? Poor     Psychosocial Assessment   Patient Belief/Attitude about Diabetes Motivated to manage diabetes   Self-care barriers Other (comment)  etoh abuse   Self-management support Doctor's office;Family   Other persons present Patient   Patient Concerns Nutrition/Meal planning;Glycemic Control;Weight Control   Special Needs None   Preferred Learning Style No preference indicated   Learning Readiness Ready   How often  do you need to have someone help you when you read instructions, pamphlets, or other written materials from your doctor or pharmacy? 1 - Never   What is the last grade level you completed in school? 2 years     Pre-Education Assessment   Patient understands the diabetes disease and treatment process. Needs Instruction   Patient understands incorporating nutritional management into lifestyle. Needs Instruction   Patient undertands incorporating physical activity into lifestyle. Needs Instruction   Patient understands monitoring blood glucose, interpreting and using results Needs Review   Patient understands prevention, detection, and treatment of acute complications. Needs Review   Patient understands prevention, detection, and treatment of chronic complications. Needs Review   Patient understands how to develop strategies to address psychosocial issues. Needs Instruction   Patient understands how to develop strategies to promote health/change behavior. Needs Instruction     Complications   Last HgB A1C per patient/outside source 5.9 %  04/30/16   How often do you check your blood sugar? 1-2 times/day   Fasting Blood glucose range (mg/dL) 70-129   Postprandial Blood glucose range (mg/dL) 130-179   Number of hypoglycemic episodes per month 0   Number of hyperglycemic episodes per week 0   Have you had a dilated eye exam in the past 12 months? Yes   Have you had a dental exam in the past 12 months? No   Are you checking your feet? Yes   How many  days per week are you checking your feet? 4     Dietary Intake   Breakfast caffeine free diet coke, egg sandwich on toast, granola bar OR skips OR leftovers   Snack (morning) none   Lunch skips or lunchmeat and cheese sandwich    Snack (afternoon) none or sandwich if missed lunch   Dinner stuffed peppers (brown rice, tomatoes, onions, sirloin)) OR spaghetti   Snack (evening) popcorn or rare no sugar added ice cream   Beverage(s) diet coke, water,  lactaid milk, beer (12 per day) but has reduced to 4 in the last week     Exercise   Exercise Type Light (walking / raking leaves)   How many days per week to you exercise? 7   How many minutes per day do you exercise? 2   Total minutes per week of exercise 14     Patient Education   Previous Diabetes Education No   Disease state  Definition of diabetes, type 1 and 2, and the diagnosis of diabetes;Factors that contribute to the development of diabetes   Nutrition management  Role of diet in the treatment of diabetes and the relationship between the three main macronutrients and blood glucose level;Food label reading, portion sizes and measuring food.;Meal options for control of blood glucose level and chronic complications.   Physical activity and exercise  Role of exercise on diabetes management, blood pressure control and cardiac health.   Monitoring Identified appropriate SMBG and/or A1C goals.;Yearly dilated eye exam;Daily foot exams   Chronic complications Dental care;Retinopathy and reason for yearly dilated eye exams   Psychosocial adjustment Worked with patient to identify barriers to care and solutions;Role of stress on diabetes;Identified and addressed patients feelings and concerns about diabetes   Personal strategies to promote health Lifestyle issues that need to be addressed for better diabetes care     Individualized Goals (developed by patient)   Nutrition General guidelines for healthy choices and portions discussed   Physical Activity Exercise 5-7 days per week;30 minutes per day   Medications Not Applicable   Monitoring  test my blood glucose as discussed   Reducing Risk Other (comment)  quit drinking   Health Coping discuss diabetes with (comment)  MD/RD     Post-Education Assessment   Patient understands the diabetes disease and treatment process. Demonstrates understanding / competency   Patient understands incorporating nutritional management into lifestyle. Needs  Review   Patient undertands incorporating physical activity into lifestyle. Demonstrates understanding / competency   Patient understands monitoring blood glucose, interpreting and using results Demonstrates understanding / competency   Patient understands prevention, detection, and treatment of acute complications. Demonstrates understanding / competency   Patient understands prevention, detection, and treatment of chronic complications. Demonstrates understanding / competency   Patient understands how to develop strategies to address psychosocial issues. Needs Review   Patient understands how to develop strategies to promote health/change behavior. Needs Review     Outcomes   Expected Outcomes Other (comment)  demonstrated mild interest in change.  question compliance   Future DMSE PRN   Program Status Completed      Individualized Plan for Diabetes Self-Management Training:   Learning Objective:  Patient will have a greater understanding of diabetes self-management. Patient education plan is to attend individual and/or group sessions per assessed needs and concerns.   Plan:   Patient Instructions  Start COPD rehab (exercise).   Consider V-8 vegetable juice rather than diet soda. Recommend limiting portion size of sweets.  Aim  for 4 Carb Choices per meal (60 grams) +/- 1 either way  Aim for 0-2 Carbs per snack if hungry  Include protein in moderation with your meals and snacks Consider reading food labels for Total Carbohydrate and Fat Grams of foods Consider checking BG at alternate times per day as directed by MD       Expected Outcomes:  Other (comment) (demonstrated mild interest in change.  question compliance)  Education material provided: Living Well with Diabetes, Food label handouts, A1C conversion sheet, Meal plan card, My Plate and Snack sheet. Label reading  If problems or questions, patient to contact team via:  Phone and Email  Future DSME appointment:  PRN

## 2016-06-28 NOTE — Patient Instructions (Addendum)
Start COPD rehab (exercise).   Consider V-8 vegetable juice rather than diet soda. Recommend limiting portion size of sweets.  Aim for 4 Carb Choices per meal (60 grams) +/- 1 either way  Aim for 0-2 Carbs per snack if hungry  Include protein in moderation with your meals and snacks Consider reading food labels for Total Carbohydrate and Fat Grams of foods Consider checking BG at alternate times per day as directed by MD

## 2016-06-29 ENCOUNTER — Telehealth: Payer: Self-pay | Admitting: Pulmonary Disease

## 2016-06-29 ENCOUNTER — Telehealth: Payer: Self-pay

## 2016-06-29 MED ORDER — ALBUTEROL SULFATE HFA 108 (90 BASE) MCG/ACT IN AERS: 2.0000 | INHALATION_SPRAY | Freq: Four times a day (QID) | RESPIRATORY_TRACT | 3 refills | Status: DC | PRN

## 2016-06-29 MED ORDER — FLUTICASONE FUROATE-VILANTEROL 100-25 MCG/INH IN AEPB: INHALATION_SPRAY | RESPIRATORY_TRACT | 3 refills | Status: DC

## 2016-06-29 NOTE — Telephone Encounter (Signed)
Pt called requesting refills on Breo and Albuterol.  Advised pt that since he is treated by Dr. Elsworth Soho for pulmonary issues, he needs to request these refills from their office.  I noted in the system at the time of the phone call that pt has already called Dr. Bari Mantis office requesting these refills.  Charyl Bigger, CMA

## 2016-06-29 NOTE — Telephone Encounter (Signed)
Spoke with pt. Aware refills for breo and albuterol has been sent in. Nothing further needed

## 2016-07-02 ENCOUNTER — Telehealth: Payer: Self-pay

## 2016-07-02 NOTE — Telephone Encounter (Signed)
Breo refill request not appropriate.  Pt is under care of pulmonologist that prescribes this medication.  Charyl Bigger, CMA

## 2016-07-03 ENCOUNTER — Ambulatory Visit (INDEPENDENT_AMBULATORY_CARE_PROVIDER_SITE_OTHER): Payer: Medicare Other | Admitting: Family Medicine

## 2016-07-03 ENCOUNTER — Encounter: Payer: Self-pay | Admitting: Family Medicine

## 2016-07-03 VITALS — BP 124/72 | HR 68 | Wt 229.0 lb

## 2016-07-03 DIAGNOSIS — F603 Borderline personality disorder: Secondary | ICD-10-CM | POA: Diagnosis not present

## 2016-07-03 DIAGNOSIS — Z09 Encounter for follow-up examination after completed treatment for conditions other than malignant neoplasm: Secondary | ICD-10-CM

## 2016-07-03 DIAGNOSIS — E119 Type 2 diabetes mellitus without complications: Secondary | ICD-10-CM

## 2016-07-03 DIAGNOSIS — F3111 Bipolar disorder, current episode manic without psychotic features, mild: Secondary | ICD-10-CM

## 2016-07-03 DIAGNOSIS — R0789 Other chest pain: Secondary | ICD-10-CM | POA: Diagnosis not present

## 2016-07-03 NOTE — Progress Notes (Signed)
Impression and Recommendations:    1. Hospital discharge follow-up   2. Other chest pain   3. Bipolar affective disorder, currently manic, mild ([place])   4. Explosive personality disorder   5. Diabetes mellitus without complication- (diet controlled; w/o proteinuria)      Explained to patient that chest pain appears to be anxiety related, And recent workup in the hospital was negative. Patient denies symptoms currently.  Several weeks ago we referred him to cardiology since he has not seen his since 2015 in August.   Told patient last office visit he needs to follow-up with his psychiatrist not just his psychologist. He has not done so yet. Encouraged him to call for appointment.  Told patient he needs follow-up with his psychiatrist, not just psychologist for further medical management as I feel his psychiatric issues are not well-controlled.  Furthermore, it appears these "chest pains" may be emotionally mediated/ due to his anxiety\panic attacks.   Patient continues to be noncompliant with dietary schedule and regimen. Highly encouraged patient that if he feels lightheaded etc he needs to obtain his blood sugars and blood pressure!!  He has not done so and I explained to him he needs to once again today. Told to keep a log, bring in next office visit for my review. This is critical in helping determine what is going on/ causing any symptoms he may be having.  Again,  highly encouraged patient to eat 3-4 small healthy meals daily on a regular basis, evenly distributed throughout the day. He still has not done so.  Even though he recently went to a nutritionist, he still doesn't seem to understand what to eat and how to eat  sEncouraged patient to continue abstinence from ETOH  - Reminded patient that even though his blood sugars are well-controlled currently,  make sure he follows up with the endocrinologist that he wanted to go to/ asked [name] to refer him to - HE denies this today,  even after I read to him the telephone conversation documented from 9\7\17    Patient's Medications  New Prescriptions   No medications on file  Previous Medications   ALBUTEROL (PROVENTIL HFA;VENTOLIN HFA) 108 (90 BASE) MCG/ACT INHALER    Inhale 2 puffs into the lungs every 6 (six) hours as needed for wheezing or shortness of breath.   ALPRAZOLAM (XANAX) 1 MG TABLET    Take 0.5 tablets (0.5 mg total) by mouth 3 (three) times daily as needed for anxiety.   AMLODIPINE (NORVASC) 10 MG TABLET    Take 1 tablet (10 mg total) by mouth daily.   ATORVASTATIN (LIPITOR) 40 MG TABLET    One tab q hs   BACLOFEN (LIORESAL) 10 MG TABLET    Take 1 tablet (10 mg total) by mouth 2 (two) times daily.   BLOOD GLUCOSE MONITORING SUPPL (ACCU-CHEK AVIVA) DEVICE    Use to check blood sugars every morning fasting, 2 hours after largest meal and as needed with low blood sugar symptoms   DEXLANSOPRAZOLE (DEXILANT) 60 MG CAPSULE    Take 1 capsule (60 mg total) by mouth daily.   EPINEPHRINE 0.3 MG/0.3 ML IJ SOAJ INJECTION    Inject 0.3 mLs (0.3 mg total) into the muscle once.   FLUTICASONE (FLONASE) 50 MCG/ACT NASAL SPRAY    Place 2 sprays into both nostrils daily.   FLUTICASONE FUROATE-VILANTEROL (BREO ELLIPTA) 100-25 MCG/INH AEPB    INHALE 1 PUFF BY MOUTH EVERY MORNING   GLUCOSE BLOOD (ACCU-CHEK AVIVA)  TEST STRIP    Check blood sugars every morning fasting, 2 hours after largest meal and as needed with low blood sugar symptoms   HYDROCODONE-ACETAMINOPHEN (NORCO/VICODIN) 5-325 MG TABLET    Take 1 tablet by mouth every 6 (six) hours as needed for moderate pain.   IPRATROPIUM-ALBUTEROL (DUONEB) 0.5-2.5 (3) MG/3ML SOLN    Take 3 mLs by nebulization every 4 (four) hours as needed (shortness of breath and wheezing).   LANCET DEVICES (ACCU-CHEK SOFTCLIX) LANCETS    Check blood sugars every morning fasting, 2 hours after largest meal and as needed with low blood sugar symptoms   LOSARTAN (COZAAR) 50 MG TABLET    Take one tab q  am for BP   MECLIZINE (ANTIVERT) 25 MG TABLET    Take 25 mg by mouth 3 (three) times daily as needed for dizziness.   METOPROLOL SUCCINATE (TOPROL-XL) 25 MG 24 HR TABLET    Take 1 tablet (25 mg total) by mouth daily.   MONTELUKAST (SINGULAIR) 10 MG TABLET    TAKE 1 TABLET (10 MG TOTAL) BY MOUTH AT BEDTIME.   NITROGLYCERIN (NITROSTAT) 0.4 MG SL TABLET    Place 1 tablet (0.4 mg total) under the tongue every 5 (five) minutes as needed for chest pain.   ONDANSETRON (ZOFRAN) 8 MG TABLET    Take 8 mg by mouth daily as needed.    OXYGEN    Inhale 2 L into the lungs at bedtime. Reported on 11/25/2015   OXYMETAZOLINE (AFRIN) 0.05 % NASAL SPRAY    Place 1 spray into both nostrils 4 (four) times daily as needed for congestion.  Modified Medications   No medications on file  Discontinued Medications   CLINDAMYCIN (CLEOCIN) 300 MG CAPSULE    Take 1 capsule (300 mg total) by mouth 4 (four) times daily. X 7 days   GABAPENTIN (NEURONTIN) 300 MG CAPSULE    Take 1 capsule (300 mg total) by mouth 3 (three) times daily as needed.   GUAIFENESIN (MUCINEX) 600 MG 12 HR TABLET    Take 1 tablet (600 mg total) by mouth 2 (two) times daily.    Return in about 4 weeks (around 07/31/2016) for A1c.  The patient was counseled, risk factors were discussed, anticipatory guidance given.  Gross side effects, risk and benefits, and alternatives of medications discussed with patient.  Patient is aware that all medications have potential side effects and we are unable to predict every side effect or drug-drug interaction that may occur.  Expresses verbal understanding and consents to current therapy plan and treatment regimen.  Please see AVS handed out to patient at the end of our visit for further patient instructions/ counseling done pertaining to today's office visit.    Note: This document was prepared using Dragon voice recognition software and may include unintentional dictation  errors.   --------------------------------------------------------------------------------------------------------------------------------------------------------------------------------------------------------------------------------------------    Subjective:    CC:  Chief Complaint  Patient presents with  . Hospitalization Follow-up    Before he went to the ED he felt his blood pressure was up and down. He also felt dizzy.    HPI: [name] is a 65 y.o. male who presents to [place] [facility] today forFollow-up to his recent hospitalization for chest pain rule out on 9\12\17.  Denies CP currently  CP: Patient had ACS ruled out, troponins were negative 3 and a 2-D echo showed a normal EF. They did not make any medication changes and he was discharged.  Appears to  be somewhat anxiety mediated and again I recommend pt f/up with his psychiatrist- which he has not  Went to the nutritionist as well.   DM:  BS been running 137- 109 FBS,    denies any hyperglycemia incidence but does not eat regularly and occasionally can go low. He went to nutritionist recently but yet today still has multiple questions about what he how often etc. - Patient recently requested a referral to [facility] endocrinology, Dr. Loanne Drilling and it was placed a couple weeks ago per his request. (Seen phone notes from [phone])   He completely denies this today.   Alcoholism:  Again today, pt is convinced that he feels bad because he stopped drinking.  Occ still drinks, not following my recommendations of complete abstinence     Wt Readings from Last 3 Encounters:  07/03/16 103.9 kg (229 lb)  06/29/16 101.2 kg (223 lb)  06/26/16 102.4 kg (225 lb 12 oz)   BP Readings from Last 3 Encounters:  07/03/16 124/72  06/26/16 122/66  06/12/16 122/76   Pulse Readings from Last 3 Encounters:  07/03/16 68  06/26/16 65  06/12/16 60     Patient Active Problem List   Diagnosis Date Noted  .  Hyperlipidemia, mixed 06/02/2016    Priority: High  . Diabetes mellitus without complication- (diet controlled; w/o proteinuria) 04/30/2016    Priority: High  . COPD (chronic obstructive pulmonary disease) ([place]) 01/13/2014    Priority: High  . Hypertension 01/13/2014    Priority: High  . Elevated liver enzymes  06/02/2016    Priority: Medium  . Heavy alcohol use- Many yrs  04/30/2016    Priority: Medium  . Obesity 04/30/2016    Priority: Medium  . h/o Prostate cancer 01/13/2014    Priority: Medium  . Patient's noncompliance with other medical treatment and regimen 06/02/2016    Priority: Low  . Basal cell carcinoma of back 04/30/2016    Priority: Low  . Hx of multiple concussions 04/30/2016    Priority: Low  . h/o Vitamin D deficiency 04/30/2016    Priority: Low  . Immunocompromised state ([place]) 04/30/2016    Priority: Low  . concerns for memory loss 09/13/2014    Priority: Low  . Diabetes mellitus type 2 in obese ([place]) 06/26/2016  . non-specific Chest pain 06/12/2016  . Schizophrenia ([place]) 04/30/2016  . Environmental and seasonal allergies 04/30/2016  . Screen for sexually transmitted diseases 04/30/2016  . Cervical radiculopathy 01/15/2016  . Alcohol withdrawal ([employer]) 11/22/2015  . h/o Suicidal thoughts 11/21/2015  . Dry mouth 10/07/2015  . Anxiety 09/19/2015  . OAB (overactive bladder) 07/27/2015  . Chronic idiopathic constipation 05/08/2015  . Screening for colon cancer   . Benign neoplasm of cecum   . Benign neoplasm of ascending colon   . Benign neoplasm of transverse colon   . Benign neoplasm of sigmoid colon   . Rectal polyp   . Gastroesophageal reflux disease without esophagitis   . Esophageal stricture   . Joint stiffness of multiple sites 10/17/2014  . Benign localized hyperplasia of prostate with urinary obstruction 09/29/2014  . Tuberculosis   . Diverticulum of bladder 06/17/2014  . Neuropathy ([place]) 04/11/2014  . Post-polio syndrome 01/13/2014  .  Former smoker 01/13/2014  . Bipolar disorder ([place]) 01/13/2014  . Explosive personality disorder 01/13/2014  . Erectile dysfunction 01/13/2014  . Nephrolithiasis 01/13/2014  . Chronic pain 01/13/2014  . Alcoholism ([place]) 01/13/2014  . OSA (obstructive sleep apnea) 01/13/2014    Past Medical history, Surgical  history, Family history, Social history, Allergies and Medications have been entered into the medical record, reviewed and changed as needed.   Allergies:  Allergies  Allergen Reactions  . Ivp Dye [Iodinated Diagnostic Agents] Anaphylaxis  . Levaquin [Levofloxacin In D5w] Shortness Of Breath and Swelling    In addition: sweating, chest pain, and diarrhea.   . Penicillins Anaphylaxis    Heart stops Has patient had a PCN reaction causing immediate rash, facial/tongue/throat swelling, SOB or lightheadedness with hypotension: yes Has patient had a PCN reaction causing severe rash involving mucus membranes or skin necrosis: yes Has patient had a PCN reaction that required hospitalization yes Has patient had a PCN reaction occurring within the last 10 years: No If all of the above answers are "NO", then may proceed with Cephalosporin use.   . Aspirin Other (See Comments)    Reaction unknown  . Nsaids Other (See Comments)    Difficulty breathing  . Tolmetin Other (See Comments)    Difficulty breathing  . Buprenorphine Hcl Nausea And Vomiting    Can take with zofran   . Morphine And Related Nausea And Vomiting    Can take with zofran   . Oxycodone Itching and Rash    Review of Systems: No fever/ chills, night sweats, no unintended weight loss, No chest pain, or increased shortness of breath. No N/V/D.  Pertinent positives and negatives noted in HPI above    Objective:   Blood pressure 124/72, pulse 68, weight 103.9 kg (229 lb), SpO2 98 %. Body mass index is 31.06 kg/m. General: Well Developed, well nourished, appropriate for stated age.  Neuro: Alert and oriented x3,  extra-ocular muscles intact, sensation grossly intact.  HEENT: Normocephalic, atraumatic, neck supple, no JVD, NO Bruit Skin: Warm and dry, no gross rash. Cardiac: RRR, S1 S2,  no murmurs rubs or gallops; + still TTP L ant chest wall near L nipple  Respiratory: ECTA B/L, Not using accessory muscles, speaking in full sentences-unlabored. Vascular:  No gross lower ext edema, cap RF less 2 sec. Psych: No HI/SI, judgement and insight ?able, Expansive, agitated, tangential

## 2016-07-03 NOTE — Patient Instructions (Signed)
Lose it app or My fitness Pal-   track everything you put into your mouth.   Guidelines for Losing Weight   We want weight loss that will last so you should lose 1-2 pounds a week.  THAT IS IT! Please pick THREE things a month to change. Once it is a habit check off the item. Then pick another three items off the list to become habits.  If you are already doing a habit on the list GREAT!  Cross that item off!  Don't drink your calories. Ie, alcohol, soda, fruit juice, and sweet tea.   Drink more water. Drink a glass when you feel hungry or before each meal.   Eat breakfast - Complex carb and protein (likeDannon light and fit yogurt, oatmeal, fruit, eggs, [place] bacon).  Measure your cereal.  Eat no more than one cup a day. (ie Kashi)  Eat an apple a day.  Add a vegetable a day.  Try a new vegetable a month.  Use Pam! Stop using oil or butter to cook.  Don't finish your plate or use smaller plates.  Share your dessert.  Eat sugar free Jello for dessert or frozen grapes.  Don't eat 2-3 hours before bed.  Switch to whole wheat bread, pasta, and brown rice.  Make healthier choices when you eat out. No fries!  Pick baked chicken, NOT fried.  Don't forget to SLOW DOWN when you eat. It is not going anywhere.   Take the stairs.  Park far away in the parking lot  Lift soup cans (or weights) for 10 minutes while watching TV.  Walk at work for 10 minutes during break.  Walk outside 1 time a week with your friend, kids, dog, or significant other.  Start a walking group at church.  Walk the mall as much as you can tolerate.   Keep a food diary.  Weigh yourself daily.  Walk for 15 minutes 3 days per week.  Cook at home more often and eat out less. If life happens and you go back to old habits, it is okay.  Just start over. You can do it!  If you experience chest pain, get short of breath, or tired during the exercise, please stop immediately and inform your doctor.     Before you even begin to attack a weight-loss plan, it pays to remember this: You are not fat. You have fat. Losing weight isn't about blame or shame; it's simply another achievement to accomplish. Dieting is like any other skill-you have to buckle down and work at it. As long as you act in a smart, reasonable way, you'll ultimately get where you want to be. Here are some weight loss pearls for you.   1. It's Not a Diet. It's a Lifestyle Thinking of a diet as something you're on and suffering through only for the short term doesn't work. To shed weight and keep it off, you need to make permanent changes to the way you eat. It's OK to indulge occasionally, of course, but if you cut calories temporarily and then revert to your old way of eating, you'll gain back the weight quicker than you can say yo-yo. Use it to lose it. Research shows that one of the best predictors of long-term weight loss is how many pounds you drop in the first month. For that reason, nutritionists often suggest being stricter for the first two weeks of your new eating strategy to build momentum. Cut out added sugar and alcohol and  avoid unrefined carbs. After that, figure out how you can reincorporate them in a way that's healthy and maintainable.  2. There's a Right Way to Exercise Working out burns calories and fat and boosts your metabolism by building muscle. But those trying to lose weight are notorious for overestimating the number of calories they burn and underestimating the amount they take in. Unfortunately, your system is biologically programmed to hold on to extra pounds and that means when you start exercising, your body senses the deficit and ramps up its hunger signals. If you're not diligent, you'll eat everything you burn and then some. Use it, to lose it. Cardio gets all the exercise glory, but strength and interval training are the real heroes. They help you build lean muscle, which in turn increases your  metabolism and calorie-burning ability 3. Don't Overreact to Mild Hunger Some people have a hard time losing weight because of hunger anxiety. To them, being hungry is bad-something to be avoided at all costs-so they carry snacks with them and eat when they don't need to. Others eat because they're stressed out or bored. While you never want to get to the point of being ravenous (that's when bingeing is likely to happen), a hunger pang, a craving, or the fact that it's 3:00 p.m. should not send you racing for the vending machine or obsessing about the energy bar in your purse. Ideally, you should put off eating until your stomach is growling and it's difficult to concentrate.  Use it to lose it. When you feel the urge to eat, use the HALT method. Ask yourself, Am I really hungry? Or am I angry or anxious, lonely or bored, or tired? If you're still not certain, try the apple test. If you're truly hungry, an apple should seem delicious; if it doesn't, something else is going on. Or you can try drinking water and making yourself busy, if you are still hungry try a healthy snack.  4. Not All Calories Are Created Equal The mechanics of weight loss are pretty simple: Take in fewer calories than you use for energy. But the kind of food you eat makes all the difference. Processed food that's high in saturated fat and refined starch or sugar can cause inflammation that disrupts the hormone signals that tell your brain you're full. The result: You eat a lot more.  Use it to lose it. Clean up your diet. Swap in whole, unprocessed foods, including vegetables, lean protein, and healthy fats that will fill you up and give you the biggest nutritional bang for your calorie buck. In a few weeks, as your brain starts receiving regular hunger and fullness signals once again, you'll notice that you feel less hungry overall and naturally start cutting back on the amount you eat.  5. Protein, Produce, and Plant-Based Fats Are Your  Weight-Loss Trinity Here's why eating the three Ps regularly will help you drop pounds. Protein fills you up. You need it to build lean muscle, which keeps your metabolism humming so that you can torch more fat. People in a weight-loss program who ate double the recommended daily allowance for protein (about 110 grams for a 150-pound woman) lost 70 percent of their weight from fat, while people who ate the RDA lost only about 40 percent, one study found. Produce is packed with filling fiber. "It's very difficult to consume too many calories if you're eating a lot of vegetables. Example: Three cups of broccoli is a lot of food, yet only 93  calories. (Fruit is another story. It can be easy to overeat and can contain a lot of calories from sugar, so be sure to monitor your intake.) Plant-based fats like olive oil and those in avocados and nuts are healthy and extra satiating.  Use it to lose it. Aim to incorporate each of the three Ps into every meal and snack. People who eat protein throughout the day are able to keep weight off, according to a study in the [place] of Clinical Nutrition. In addition to meat, poultry and seafood, good sources are beans, lentils, eggs, tofu, and yogurt. As for fat, keep portion sizes in check by measuring out salad dressing, oil, and nut butters (shoot for one to two tablespoons). Finally, eat veggies or a little fruit at every meal. People who did that consumed 308 fewer calories but didn't feel any hungrier than when they didn't eat more produce.  7. How You Eat Is As Important As What You Eat In order for your brain to register that you're full, you need to focus on what you're eating. Sit down whenever you eat, preferably at a table. Turn off the TV or computer, put down your phone, and look at your food. Smell it. Chew slowly, and don't put another bite on your fork until you swallow. When women ate lunch this attentively, they consumed 30 percent less when  snacking later than those who listened to an audiobook at lunchtime, according to a study in the [place] of Nutrition. 8. Weighing Yourself Really Works The scale provides the best evidence about whether your efforts are paying off. Seeing the numbers tick up or down or stagnate is motivation to keep going-or to rethink your approach. A 2015 study at [facility] found that daily weigh-ins helped people lose more weight, keep it off, and maintain that loss, even after two years. Use it to lose it. Step on the scale at the same time every day for the best results. If your weight shoots up several pounds from one weigh-in to the next, don't freak out. Eating a lot of salt the night before or having your period is the likely culprit. The number should return to normal in a day or two. It's a steady climb that you need to do something about. 9. Too Much Stress and Too Little Sleep Are Your Enemies When you're tired and frazzled, your body cranks up the production of cortisol, the stress hormone that can cause carb cravings. Not getting enough sleep also boosts your levels of ghrelin, a hormone associated with hunger, while suppressing leptin, a hormone that signals fullness and satiety. People on a diet who slept only five and a half hours a night for two weeks lost 55 percent less fat and were hungrier than those who slept eight and a half hours, according to a study in the [place]. Use it to lose it. Prioritize sleep, aiming for seven hours or more a night, which research shows helps lower stress. And make sure you're getting quality zzz's. If a snoring spouse or a fidgety cat wakes you up frequently throughout the night, you may end up getting the equivalent of just four hours of sleep, according to a study from [facility]. Keep pets out of the bedroom, and use a white-noise app to drown out snoring. 10. You Will Hit a plateau-And You Can Bust Through It As  you slim down, your body releases much less leptin, the fullness hormone.  If  you're not strength training, start right now. Building muscle can raise your metabolism to help you overcome a plateau. To keep your body challenged and burning calories, incorporate new moves and more intense intervals into your workouts or add another sweat session to your weekly routine. Alternatively, cut an extra 100 calories or so a day from your diet. Now that you've lost weight, your body simply doesn't need as much fuel.      Since food equals calories, in order to lose weight you must either eat fewer calories, exercise more to burn off calories with activity, or both. Food that is not used to fuel the body is stored as fat. A major component of losing weight is to make smarter food choices. Here's how:  1)   Limit non-nutritious foods, such as: Sugar, honey, syrups and candy Pastries, donuts, pies, cakes and cookies Soft drinks, sweetened juices and alcoholic beverages  2)  Cut down on high-fat foods by: - Choosing poultry, fish or lean red meat - Choosing low-fat cooking methods, such as baking, broiling, steaming, grilling and boiling - Using low-fat or non-fat dairy products - Using vinaigrette, herbs, lemon or fat-free salad dressings - Avoiding fatty meats, such as bacon, sausage, franks, ribs and luncheon meats - Avoiding high-fat snacks like nuts, chips and chocolate - Avoiding fried foods - Using less butter, margarine, oil and mayonnaise - Avoiding high-fat gravies, cream sauces and cream-based soups  3) Eat a variety of foods, including: - Fruit and vegetables that are raw, steamed or baked - Whole grains, breads, cereal, rice and pasta - Dairy products, such as low-fat or non-fat milk or yogurt, low-fat cottage cheese and low-fat cheese - Protein-rich foods like chicken, [place], fish, lean meat and legumes, or beans  4) Change your eating habits by: - Eat three balanced meals a day to  help control your hunger - Watch portion sizes and eat small servings of a variety of foods - Choose low-calorie snacks - Eat only when you are hungry and stop when you are satisfied - Eat slowly and try not to perform other tasks while eating - Find other activities to distract you from food, such as walking, taking up a hobby or being involved in the community - Include regular exercise in your daily routine ( minimum of 20 min of moderate-intensity exercise at least 5 days/week)  - Find a support group, if necessary, for emotional support in your weight loss journey      Easy ways to cut 100 calories  1. Eat your eggs with hot sauce OR salsa instead of cheese.  Eggs are great for breakfast, but many people consider eggs and cheese to be BFFs. Instead of cheese-1 oz. of cheddar has 114 calories-top your eggs with hot sauce, which contains no calories and helps with satiety and metabolism. Salsa is also a great option!!  2. Top your toast, waffles or pancakes with fresh berries instead of jelly or syrup. Half a cup of berries-fresh, frozen or thawed-has about 40 calories, compared with 2 tbsp. of maple syrup or jelly, which both have about 100 calories. The berries will also give you a good punch of fiber, which helps keep you full and satisfied and won't spike blood sugar quickly like the jelly or syrup. 3. Swap the non-fat latte for black coffee with a splash of half-and-half. Contrary to its name, that non-fat latte has 130 calories and a startling 19g of carbohydrates per 16 oz. serving. Replacing that 'light' drinkable dessert  with a black coffee with a splash of half-and-half saves you more than 100 calories per 16 oz. serving. 4. Sprinkle salads with freeze-dried raspberries instead of dried cranberries. If you want a sweet addition to your nutritious salad, stay away from dried cranberries. They have a whopping 130 calories per  cup and 30g carbohydrates. Instead, sprinkle freeze-dried  raspberries guilt-free and save more than 100 calories per  cup serving, adding 3g of belly-filling fiber. 5. Go for mustard in place of mayo on your sandwich. Mustard can add really nice flavor to any sandwich, and there are tons of varieties, from spicy to honey. A serving of mayo is 95 calories, versus 10 calories in a serving of mustard.  Or try an avocado mayo spread: You can find the recipe few click this link: https://www.californiaavocado.com/recipes/recipe-container/[place]-avocado-mayo 6. Choose a DIY salad dressing instead of the store-bought kind. Mix Dijon or whole grain mustard with low-fat Kefir or red wine vinegar and garlic. 7. Use hummus as a spread instead of a dip. Use hummus as a spread on a high-fiber cracker or tortilla with a sandwich and save on calories without sacrificing taste. 8. Pick just one salad "accessory." Salad isn't automatically a calorie winner. It's easy to over-accessorize with toppings. Instead of topping your salad with nuts, avocado and cranberries (all three will clock in at 313 calories), just pick one. The next day, choose a different accessory, which will also keep your salad interesting. You don't wear all your jewelry every day, right? 9. Ditch the white pasta in favor of spaghetti squash. One cup of cooked spaghetti squash has about 40 calories, compared with traditional spaghetti, which comes with more than 200. Spaghetti squash is also nutrient-dense. It's a good source of fiber and Vitamins A and C, and it can be eaten just like you would eat pasta-with a great tomato sauce and [place] meatballs or with pesto, tofu and spinach, for example. 10. Dress up your chili, soups and stews with non-fat [place] yogurt instead of sour cream. Just a 'dollop' of sour cream can set you back 115 calories and a whopping 12g of fat-seven of which are of the artery-clogging variety. Added bonus: [place] yogurt is packed with muscle-building protein, calcium and B  Vitamins. 11. Mash cauliflower instead of mashed potatoes. One cup of traditional mashed potatoes-in all their creamy goodness-has more than 200 calories, compared to mashed cauliflower, which you can typically eat for less than 100 calories per 1 cup serving. Cauliflower is a great source of the antioxidant indole-3-carbinol (I3C), which may help reduce the risk of some cancers, like breast cancer. 12. Ditch the ice cream sundae in favor of a [place] yogurt parfait. Instead of a cup of ice cream or fro-yo for dessert, try 1 cup of nonfat Greek yogurt topped with fresh berries and a sprinkle of cacao nibs. Both toppings are packed with antioxidants, which can help reduce cellular inflammation and oxidative damage. And the comparison is a no-brainer: One cup of ice cream has about 275 calories; one cup of frozen yogurt has about 230; and a cup of Greek yogurt has just 130, plus twice the protein, so you're less likely to return to the freezer for a second helping. 13. Put olive oil in a spray container instead of using it directly from the bottle. Each tablespoon of olive oil is 120 calories and 15g of fat. Use a mister instead of pouring it straight into the pan or onto a salad. This allows for portion control and  will save you more than 100 calories. 14. When baking, substitute canned pumpkin for butter or oil. Canned pumpkin-not pumpkin pie mix-is loaded with Vitamin A, which is important for skin and eye health, as well as immunity. And the comparisons are pretty crazy:  cup of canned pumpkin has about 40 calories, compared to butter or oil, which has more than 800 calories. Yes, 800 calories. Applesauce and mashed banana can also serve as good substitutions for butter or oil, usually in a 1:1 ratio. 15. Top casseroles with high-fiber cereal instead of breadcrumbs. Breadcrumbs are typically made with white bread, while breakfast cereals contain 5-9g of fiber per serving. Not only will you save more than  150 calories per  cup serving, the swap will also keep you more full and you'll get a metabolism boost from the added fiber. 16. Snack on pistachios instead of macadamia nuts. Believe it or not, you get the same amount of calories from 35 pistachios (100 calories) as you would from only five macadamia nuts. 17. Chow down on kale chips rather than potato chips. This is my favorite 'don't knock it 'till you try it' swap. Kale chips are so easy to make at home, and you can spice them up with a little grated parmesan or chili powder. Plus, they're a mere fraction of the calories of potato chips, but with the same crunch factor we crave so often. 18. Add seltzer and some fruit slices to your cocktail instead of soda or fruit juice. One cup of soda or fruit juice can pack on as much as 140 calories. Instead, use seltzer and fruit slices. The fruit provides valuable phytochemicals, such as flavonoids and anthocyanins, which help to combat cancer and stave off the aging process.      Phentermine  While taking the medication we may ask that you come into the office once a month or once every 2-3 months to monitor your weight, blood pressure, and heart rate. In addition we can help answer your questions about diet, exercise, and help you every step of the way with your weight loss journey. Sometime it is helpful if you bring in a food diary or use an app on your phone such as myfitnesspal to record your calorie intake, especially in the beginning.   You can start out on 1/3 to 1/2 a pill in the morning and if you are tolerating it well you can increase to one pill daily. I also have some patients that take 1/3 or 1/2 at lunch to help prevent night time eating.  This medication is cheapest CASH pay at  is 14-17 dollars and you do NOT need a membership to get meds from there.    What is this medicine? PHENTERMINE (FEN ter meen) decreases your appetite. This medicine is  intended to be used in addition to a healthy reduced calorie diet and exercise. The best results are achieved this way. This medicine is only indicated for short-term use. Eventually your weight loss may level out and the medication will no longer be needed.   How should I use this medicine? Take this medicine by mouth. Follow the directions on the prescription label. The tablets should stay in the bottle until immediately before you take your dose. Take your doses at regular intervals. Do not take your medicine more often than directed.  Overdosage: If you think you have taken too much of this medicine contact a poison control center or emergency room at once.  NOTE: This medicine is only for you. Do not share this medicine with others.  What if I miss a dose? If you miss a dose, take it as soon as you can. If it is almost time for your next dose, take only that dose. Do not take double or extra doses. Do not increase or in any way change your dose without consulting your doctor.  What should I watch for while using this medicine? Notify your physician immediately if you become short of breath while doing your normal activities. Do not take this medicine within 6 hours of bedtime. It can keep you from getting to sleep. Avoid drinks that contain caffeine and try to stick to a regular bedtime every night. Do not stand or sit up quickly, especially if you are an older patient. This reduces the risk of dizzy or fainting spells. Avoid alcoholic drinks.  What side effects may I notice from receiving this medicine? Side effects that you should report to your doctor or health care professional as soon as possible: -chest pain, palpitations -depression or severe changes in mood -increased blood pressure -irritability -nervousness or restlessness -severe dizziness -shortness of breath -problems urinating -unusual swelling of the legs -vomiting  Side effects that usually do not require medical  attention (report to your doctor or health care professional if they continue or are bothersome): -blurred vision or other eye problems -changes in sexual ability or desire -constipation or diarrhea -difficulty sleeping -dry mouth or unpleasant taste -headache -nausea This list may not describe all possible side effects. Call your doctor for medical advice about side effects. You may report side effects to FDA at 1-800-FDA-1088.

## 2016-07-06 ENCOUNTER — Other Ambulatory Visit: Payer: Self-pay

## 2016-07-06 MED ORDER — METOPROLOL SUCCINATE ER 25 MG PO TB24: 25.0000 mg | ORAL_TABLET | Freq: Every day | ORAL | 0 refills | Status: DC

## 2016-07-06 NOTE — Telephone Encounter (Signed)
Further RF by his cardiologist

## 2016-07-06 NOTE — Telephone Encounter (Signed)
Only 30 day supply- No RF b/c because he needs to get into see his cardiologist and get further heart medications from them.  I spoke to patient at last office visit about this directly, and he voiced understanding that he needs to follow-up with his cardiologist.

## 2016-07-09 NOTE — Telephone Encounter (Signed)
LVM informing pt that he needs to see his cardiologist and request future refills for cardiac medications thru their office.  Charyl Bigger, CMA

## 2016-07-13 ENCOUNTER — Encounter: Payer: Self-pay | Admitting: Cardiology

## 2016-07-16 ENCOUNTER — Encounter: Payer: Self-pay | Admitting: Cardiology

## 2016-07-16 ENCOUNTER — Ambulatory Visit (INDEPENDENT_AMBULATORY_CARE_PROVIDER_SITE_OTHER): Payer: Medicare Other | Admitting: Cardiology

## 2016-07-16 VITALS — BP 110/60 | HR 64 | Ht 72.0 in | Wt 223.0 lb

## 2016-07-16 DIAGNOSIS — E784 Other hyperlipidemia: Secondary | ICD-10-CM | POA: Diagnosis not present

## 2016-07-16 DIAGNOSIS — I10 Essential (primary) hypertension: Secondary | ICD-10-CM

## 2016-07-16 DIAGNOSIS — E7849 Other hyperlipidemia: Secondary | ICD-10-CM

## 2016-07-16 DIAGNOSIS — R072 Precordial pain: Secondary | ICD-10-CM | POA: Diagnosis not present

## 2016-07-16 NOTE — Progress Notes (Signed)
HPI: 65 year old male for evaluation of chest pain. Nuclear study August 2015 normal. Abdominal CT April 2016 showed no aneurysm. Echocardiogram September 2017 showed normal LV systolic function and moderate left atrial enlargement. Patient admitted September 2017 with atypical chest pain. Patient ruled out with negative enzymes. Patient has had occasional chest pain for approximately 5 years. The pain is in the left substernal area and described as a sharp pain. He occasionally has numbness and tingling in his left upper extremity associated with the pain. It does not radiate otherwise. There is some diaphoresis but no nausea, vomiting or dyspnea. Can occur both with exertion and at rest. He has mild dyspnea on exertion but no orthopnea, PND or pedal edema.   Current Outpatient Prescriptions  Medication Sig Dispense Refill  . albuterol (PROVENTIL HFA;VENTOLIN HFA) 108 (90 Base) MCG/ACT inhaler Inhale 2 puffs into the lungs every 6 (six) hours as needed for wheezing or shortness of breath. 3 Inhaler 3  . ALPRAZolam (XANAX) 1 MG tablet Take 0.5 tablets (0.5 mg total) by mouth 3 (three) times daily as needed for anxiety. 90 tablet 0  . amLODipine (NORVASC) 10 MG tablet Take 1 tablet (10 mg total) by mouth daily. 90 tablet 0  . atorvastatin (LIPITOR) 40 MG tablet One tab q hs 90 tablet 1  . baclofen (LIORESAL) 10 MG tablet Take 1 tablet (10 mg total) by mouth 2 (two) times daily. (Patient taking differently: Take 10 mg by mouth daily as needed (for leg cramps). ) 60 each 0  . Blood Glucose Monitoring Suppl (ACCU-CHEK AVIVA) device Use to check blood sugars every morning fasting, 2 hours after largest meal and as needed with low blood sugar symptoms 1 each 0  . dexlansoprazole (DEXILANT) 60 MG capsule Take 1 capsule (60 mg total) by mouth daily. (Patient taking differently: Take 60 mg by mouth every other day. ) 90 capsule 3  . EPINEPHrine 0.3 mg/0.3 mL IJ SOAJ injection Inject 0.3 mLs (0.3 mg total)  into the muscle once. 1 Device 1  . fluticasone (FLONASE) 50 MCG/ACT nasal spray Place 2 sprays into both nostrils daily. 16 g 6  . fluticasone furoate-vilanterol (BREO ELLIPTA) 100-25 MCG/INH AEPB INHALE 1 PUFF BY MOUTH EVERY MORNING 180 each 3  . glucose blood (ACCU-CHEK AVIVA) test strip Check blood sugars every morning fasting, 2 hours after largest meal and as needed with low blood sugar symptoms 100 each 12  . HYDROcodone-acetaminophen (NORCO/VICODIN) 5-325 MG tablet Take 1 tablet by mouth every 6 (six) hours as needed for moderate pain.    Marland Kitchen ipratropium-albuterol (DUONEB) 0.5-2.5 (3) MG/3ML SOLN Take 3 mLs by nebulization every 4 (four) hours as needed (shortness of breath and wheezing).    Elmore Guise Devices (ACCU-CHEK SOFTCLIX) lancets Check blood sugars every morning fasting, 2 hours after largest meal and as needed with low blood sugar symptoms 100 each 12  . losartan (COZAAR) 50 MG tablet Take one tab q am for BP 90 tablet 1  . meclizine (ANTIVERT) 25 MG tablet Take 25 mg by mouth 3 (three) times daily as needed for dizziness.    . metoprolol succinate (TOPROL-XL) 25 MG 24 hr tablet Take 1 tablet (25 mg total) by mouth daily. 30 tablet 0  . montelukast (SINGULAIR) 10 MG tablet TAKE 1 TABLET (10 MG TOTAL) BY MOUTH AT BEDTIME. (Patient taking differently: Take 10 mg by mouth at bedtime as needed (for allergies.). ) 90 tablet 1  . nitroGLYCERIN (NITROSTAT) 0.4 MG SL tablet Place  1 tablet (0.4 mg total) under the tongue every 5 (five) minutes as needed for chest pain. 30 tablet 3  . ondansetron (ZOFRAN) 8 MG tablet Take 8 mg by mouth daily as needed.     . OXYGEN Inhale 2 L into the lungs at bedtime. Reported on 11/25/2015    . oxymetazoline (AFRIN) 0.05 % nasal spray Place 1 spray into both nostrils 4 (four) times daily as needed for congestion.     No current facility-administered medications for this visit.     Allergies  Allergen Reactions  . Ivp Dye [Iodinated Diagnostic Agents]  Anaphylaxis  . Levaquin [Levofloxacin In D5w] Shortness Of Breath and Swelling    In addition: sweating, chest pain, and diarrhea.   . Penicillins Anaphylaxis    Heart stops Has patient had a PCN reaction causing immediate rash, facial/tongue/throat swelling, SOB or lightheadedness with hypotension: yes Has patient had a PCN reaction causing severe rash involving mucus membranes or skin necrosis: yes Has patient had a PCN reaction that required hospitalization yes Has patient had a PCN reaction occurring within the last 10 years: No If all of the above answers are "NO", then may proceed with Cephalosporin use.   . Aspirin Other (See Comments)    Reaction unknown  . Nsaids Other (See Comments)    Difficulty breathing  . Tolmetin Other (See Comments)    Difficulty breathing  . Buprenorphine Hcl Nausea And Vomiting    Can take with zofran   . Morphine And Related Nausea And Vomiting    Can take with zofran   . Oxycodone Itching and Rash     Past Medical History:  Diagnosis Date  . Anxiety   . Anxiety disorder   . Arthritis    BILATERAL SHOULDERS, ELBOWS AND HANDS AND LEFT HIP AND KNEES--HX CORTISONE SHOTS IN SHOULDERS, ELBOWS, HIP AND KNEES  . Basal cell carcinoma of back 04/30/2016   Dermatologist- Dr Nevada Crane;   MOHS sx- Dr Levada Dy   . Bladder outlet obstruction   . Chronic idiopathic constipation 05/08/2015  . Complication of anesthesia    DIFFICULT WAKING   . COPD (chronic obstructive pulmonary disease) Gold C Frequent exacerbations 01/13/2014   [name] 07/08/14: FeV1 51% FeV1/FVC 66% FVC 59% 10/5/2015ONO RA was normal  10/13/2014  ONO on RA NORMAL   . COPD, frequent exacerbations ([place])    pulmologist-  dr Joya Gaskins--  [place] Rm Stage C.04-25-15 recent COPD exacerbation-much improved now, after tx. in ER [facility].  Marland Kitchen Depression   . Diabetes mellitus without complication ([place])    BODERLINE - DIET CONTROL  . Dysrhythmia    PVC'S  . Family history of adverse reaction to anesthesia     father would wake up with agitation   . Former smoker 01/13/2014  . Gastroesophageal reflux disease without esophagitis   . GERD (gastroesophageal reflux disease)   . Heavy alcohol use 04/30/2016  . History of chronic bronchitis   . History of oxygen administration    oxygen use 2 l/m nasally at bedtime and exertional occasions  . History of rheumatic fever   . History of TB (tuberculosis)    1984--  hospitalized for 4 month treatment  . History of urinary retention   . Hx of multiple concussions    x 2 per patient   . Hypertension   . Melanoma ([place])   . Nocturnal oxygen desaturation    USES O2 NIGHTLY  . OSA (obstructive sleep apnea) 01/13/2014  . Pneumonia    hx of   .  PONV (postoperative nausea and vomiting)   . Post-polio syndrome    polio at age 70--PT WAS IN IRON LUNG; PT WAS IN W/C UNTIL AGE 37; STILL HAS WEAKNESS RIGHT SIDE  . Prostate cancer ([place])   . Prostate cancer ([place])   . Schizophrenia ([place])   . Shortness of breath dyspnea    RIGHT HEMIDIAPHRAGM ELEVATION - POST POLIO SYNDROME  . Tuberculosis    Hosp 4 months rx , left early     Past Surgical History:  Procedure Laterality Date  . CARDIOVASCULAR STRESS TEST  06-08-2014  dr Mare Ferrari   normal lexiscan study/  no ischemia/  not gated due to PAC's  . COLONOSCOPY N/A 05/03/2015   Procedure: COLONOSCOPY;  Surgeon: [name], MD;  Location: [facility];  Service: Endoscopy;  Laterality: N/A;  . CYSTOSCOPY N/A 10/25/2015   Procedure: CYSTOSCOPY;  Surgeon: [name], MD;  Location: [facility];  Service: Urology;  Laterality: N/A;  . CYSTOSCOPY W/ CYSTOGRAM/  TRANSRECTAL ULTRASOUND PROSTATE BX  03-22-2009  . ESOPHAGOGASTRODUODENOSCOPY N/A 03/22/2015   Procedure: ESOPHAGOGASTRODUODENOSCOPY (EGD) with dilation;  Surgeon: [name], MD;  Location: [facility];  Service: Endoscopy;  Laterality: N/A;  . excision of skin lesion    . LAPAROSCOPIC CHOLECYSTECTOMY  2013  . left elbow surgery      due to fracture   . NASAL  SEPTUM SURGERY  2000  . OTHER SURGICAL HISTORY      Muscle & bone Graft/Polio  . polio surgeries      14 polio surgeries   . PROSTATE BIOPSY N/A 09/28/2014   Procedure: PROSTATE ULTRASOUND/BIOPSY;  Surgeon: [name], MD;  Location: [facility];  Service: Urology;  Laterality: N/A;  . PROSTATE BIOPSY N/A 10/25/2015   Procedure: PROSTATE BIOPSY AND ULTRASOUND;  Surgeon: [name], MD;  Location: [facility];  Service: Urology;  Laterality: N/A;  . SAVORY DILATION N/A 03/22/2015   Procedure: SAVORY DILATION;  Surgeon: [name], MD;  Location: [facility];  Service: Endoscopy;  Laterality: N/A;  . SHOULDER ARTHROSCOPY WITH OPEN ROTATOR CUFF REPAIR Bilateral 2013  &  1999   removal spurs and bursectomy  . TRANSURETHRAL INCISION OF BLADDER NECK N/A 10/25/2015   Procedure:  TRANSURETHRAL INCISION OF BLADDER NECK;  Surgeon: [name], MD;  Location: [facility];  Service: Urology;  Laterality: N/A;  . TRANSURETHRAL RESECTION OF PROSTATE N/A 09/28/2014   Procedure: TRANSURETHRAL RESECTION OF THE PROSTATE (TURP);  Surgeon: [name], MD;  Location: [facility];  Service: Urology;  Laterality: N/A;  . [place] EXTRACTION  2000    Social History   Social History  . Marital status: Married    Spouse name: N/A  . Number of children: N/A  . Years of education: N/A   Occupational History  . Retired    Social History Main Topics  . Smoking status: Former Smoker    Years: 47.00    Quit date: 08/25/2013  . Smokeless tobacco: Former Systems developer  . Alcohol use 3.6 oz/week    6 Cans of beer per week     Comment: daily  . Drug use: No     Comment: hx of marijuana use   . Sexual activity: Yes    Birth control/ protection: None   Other Topics Concern  . Not on file   Social History Narrative  . No narrative on file    Family History  Problem Relation Age of Onset  . Alzheimer's disease Father 77  Deceased  . Stomach cancer Father   . Heart attack Father   . Heart disease Father   . Skin  cancer Mother     Facial-Living  . Cancer Mother     skin  . Alcohol abuse Sister     x2  . Mental illness Sister     x2  . Alcohol abuse Sister   . Diabetes Maternal Aunt     x2  . Thyroid disease Maternal Aunt     x4  . Diabetes Maternal Uncle   . Tuberculosis Paternal Grandfather   . Tuberculosis Paternal Grandmother   . Alzheimer's disease Paternal Aunt   . Alzheimer's disease Paternal Uncle   . Colon cancer Neg Hx   . Colon polyps Neg Hx   . Crohn's disease Neg Hx   . Ulcerative colitis Neg Hx     ROS: no fevers or chills, productive cough, hemoptysis, dysphasia, odynophagia, melena, hematochezia, dysuria, hematuria, rash, seizure activity, orthopnea, PND, pedal edema, claudication. Remaining systems are negative.  Physical Exam:   Blood pressure 110/60, pulse 64, height 6' (1.829 m), weight 223 lb (101.2 kg).  General:  Well developed/well nourished in NAD Skin warm/dry Patient not depressed No peripheral clubbing Back-normal HEENT-normal/normal eyelids Neck supple/normal carotid upstroke bilaterally; no bruits; no JVD; no thyromegaly chest - CTA/ normal expansion CV - RRR/normal S1 and S2; no murmurs, rubs or gallops;  PMI nondisplaced Abdomen -NT/ND, no HSM, no mass, + bowel sounds, no bruit 2+ femoral pulses, no bruits Ext-no edema, chords, 2+ DP Neuro-grossly nonfocal  ECG 06/26/16-Sinus bradycardia with no ST changes  A/P  1chest pain-symptoms are atypical. I have reviewed previous electrocardiogram from September 12. No ST changes. Plan exercise treadmill for risk stratification.  2 hypertension-blood pressure controlled. Continue present medications.  3 hyperlipidemia-continue statin.  Kirk Ruths, MD

## 2016-07-16 NOTE — Patient Instructions (Signed)
Testing/Procedures:  Your physician has requested that you have an exercise tolerance test. For further information please visit [email or web address]. Please also follow instruction sheet, as given.    Follow-Up:  Your physician recommends that you schedule a follow-up appointment in: AS NEEDED PENDING TEST RESULTS    Exercise Stress Electrocardiogram An exercise stress electrocardiogram is a test to check how blood flows to your heart. It is done to find areas of poor blood flow. You will need to walk on a treadmill for this test. The electrocardiogram will record your heartbeat when you are at rest and when you are exercising. BEFORE THE PROCEDURE  Do not have drinks with caffeine or foods with caffeine for 24 hours before the test, or as told by your doctor. This includes coffee, tea (even decaf tea), sodas, chocolate, and cocoa.  Follow your doctor's instructions about eating and drinking before the test.  Ask your doctor what medicines you should or should not take before the test. Take your medicines with water unless told by your doctor not to.  If you use an inhaler, bring it with you to the test.  Bring a snack to eat after the test.  Do not  smoke for 4 hours before the test.  Do not put lotions, powders, creams, or oils on your chest before the test.  Wear comfortable shoes and clothing. PROCEDURE  You will have patches put on your chest. Small areas of your chest may need to be shaved. Wires will be connected to the patches.  Your heart rate will be watched while you are resting and while you are exercising.  You will walk on the treadmill. The treadmill will slowly get faster to raise your heart rate.  The test will take about 1-2 hours. AFTER THE PROCEDURE  Your heart rate and blood pressure will be watched after the test.  You may return to your normal diet, activities, and medicines or as told by your doctor.   This information is not intended to replace  advice given to you by your health care provider. Make sure you discuss any questions you have with your health care provider.   Document Released: 03/19/2008 Document Revised: 10/22/2014 Document Reviewed: 06/08/2013 Elsevier Interactive Patient Education [employer].

## 2016-07-17 ENCOUNTER — Ambulatory Visit (INDEPENDENT_AMBULATORY_CARE_PROVIDER_SITE_OTHER): Payer: Medicare Other | Admitting: Endocrinology

## 2016-07-17 ENCOUNTER — Encounter: Payer: Self-pay | Admitting: Endocrinology

## 2016-07-17 VITALS — BP 112/56 | HR 59 | Ht 72.0 in | Wt 223.0 lb

## 2016-07-17 DIAGNOSIS — I10 Essential (primary) hypertension: Secondary | ICD-10-CM | POA: Diagnosis not present

## 2016-07-17 DIAGNOSIS — E119 Type 2 diabetes mellitus without complications: Secondary | ICD-10-CM

## 2016-07-17 LAB — LIPID PANEL
CHOLESTEROL: 181 mg/dL (ref 0–200)
HDL: 48 mg/dL (ref >39.00)
NonHDL: 133.21
Total CHOL/HDL Ratio: 4
Triglycerides: 245 mg/dL — ABNORMAL HIGH (ref 0.0–149.0)
VLDL: 49 mg/dL — ABNORMAL HIGH (ref 0.0–40.0)

## 2016-07-17 LAB — LDL CHOLESTEROL, DIRECT: Direct LDL: 103 mg/dL

## 2016-07-17 MED ORDER — METFORMIN HCL ER 500 MG PO TB24: 500.0000 mg | ORAL_TABLET | Freq: Every day | ORAL | 3 refills | Status: DC

## 2016-07-17 NOTE — Patient Instructions (Addendum)
good diet and exercise significantly improve the control of your diabetes.  please let me know if you wish to be referred to a dietician.  high blood sugar is very risky to your health.  you should see an eye doctor and dentist every year.  It is very important to get all recommended vaccinations.   Controlling your blood pressure and cholesterol drastically reduces the damage diabetes does to your body.  Those who smoke should quit.  Please discuss these with your doctor.  You do have diabetes.  However, it is common for people in the early stages of diabetes to have hypoglycemia after eating. blood tests are requested for you today.  We'll let you know about the results. The cholesterol medication also help protect your eyes.   check your blood sugar once a day.  vary the time of day when you check, between before the 3 meals, and at bedtime.  also check if you have symptoms of your blood sugar being too low.  please keep a record of the readings and bring it to your next appointment here (or you can bring the meter itself).  You can write it on any piece of paper.  please call us sooner if your blood sugar goes below 70, or if you have a lot of readings over 200. I have sent a prescription to your pharmacy, for the metformin-XR.   Please come back for a follow-up appointment in 2 months.

## 2016-07-17 NOTE — Progress Notes (Signed)
Subjective:    Patient ID: [name], male    DOB: 08/18/1951, 65 y.o.   MRN: [id number]  HPI pt states DM was dx'ed in mid-2017, but he questions this dx; he has chronic moderate weakness of the legs and assoc chest muscle weakness (polio).  He has had multiple surgeries from this.  he was found to have mild DR.  he has never been on rx for DM; pt says his diet is poor.  exercise is limited by polio; he has never had pancreatitis.  He has intermittent severe fatigue 1 hr after eating, relieved by eating again.  He brings a record of his cbg's which I have reviewed today, all checked fasting.  It varies from 89-222.  It is in general higher as the day goes on, but not necessarily so.  He says a1c has been as high as 6.9% in the past.    Past Medical History:  Diagnosis Date  . Anxiety   . Anxiety disorder   . Arthritis    BILATERAL SHOULDERS, ELBOWS AND HANDS AND LEFT HIP AND KNEES--HX CORTISONE SHOTS IN SHOULDERS, ELBOWS, HIP AND KNEES  . Basal cell carcinoma of back 04/30/2016   Dermatologist- Dr Nevada Crane;   MOHS sx- Dr Levada Dy   . Bladder outlet obstruction   . Chronic idiopathic constipation 05/08/2015  . Complication of anesthesia    DIFFICULT WAKING   . COPD (chronic obstructive pulmonary disease) [place] C Frequent exacerbations 01/13/2014   [name] 07/08/14: FeV1 51% FeV1/FVC 66% FVC 59% 10/5/2015ONO RA was normal  10/13/2014  ONO on RA NORMAL   . COPD, frequent exacerbations ([place])    pulmologist-  dr Joya Gaskins--  [place] Rm Stage C.04-25-15 recent COPD exacerbation-much improved now, after tx. in ER [facility].  Marland Kitchen Depression   . Diabetes mellitus without complication ([place])    BODERLINE - DIET CONTROL  . Dysrhythmia    PVC'S  . Family history of adverse reaction to anesthesia    father would wake up with agitation   . Former smoker 01/13/2014  . Gastroesophageal reflux disease without esophagitis   . GERD (gastroesophageal reflux disease)   . Heavy alcohol use 04/30/2016  . History of chronic  bronchitis   . History of oxygen administration    oxygen use 2 l/m nasally at bedtime and exertional occasions  . History of rheumatic fever   . History of TB (tuberculosis)    1984--  hospitalized for 4 month treatment  . History of urinary retention   . Hx of multiple concussions    x 2 per patient   . Hypertension   . Melanoma ([place])   . Nocturnal oxygen desaturation    USES O2 NIGHTLY  . OSA (obstructive sleep apnea) 01/13/2014  . Pneumonia    hx of   . PONV (postoperative nausea and vomiting)   . Post-polio syndrome    polio at age 51--PT WAS IN IRON LUNG; PT WAS IN W/C UNTIL AGE 6; STILL HAS WEAKNESS RIGHT SIDE  . Prostate cancer ([place])   . Prostate cancer ([place])   . Schizophrenia ([place])   . Shortness of breath dyspnea    RIGHT HEMIDIAPHRAGM ELEVATION - POST POLIO SYNDROME  . Tuberculosis    Hosp 4 months rx , left early     Past Surgical History:  Procedure Laterality Date  . CARDIOVASCULAR STRESS TEST  06-08-2014  dr Mare Ferrari   normal lexiscan study/  no ischemia/  not gated due to PAC's  . COLONOSCOPY N/A 05/03/2015  Procedure: COLONOSCOPY;  Surgeon: [name], MD;  Location: [facility];  Service: Endoscopy;  Laterality: N/A;  . CYSTOSCOPY N/A 10/25/2015   Procedure: CYSTOSCOPY;  Surgeon: [name], MD;  Location: [facility];  Service: Urology;  Laterality: N/A;  . CYSTOSCOPY W/ CYSTOGRAM/  TRANSRECTAL ULTRASOUND PROSTATE BX  03-22-2009  . ESOPHAGOGASTRODUODENOSCOPY N/A 03/22/2015   Procedure: ESOPHAGOGASTRODUODENOSCOPY (EGD) with dilation;  Surgeon: [name], MD;  Location: [facility];  Service: Endoscopy;  Laterality: N/A;  . excision of skin lesion    . LAPAROSCOPIC CHOLECYSTECTOMY  2013  . left elbow surgery      due to fracture   . NASAL SEPTUM SURGERY  2000  . OTHER SURGICAL HISTORY      Muscle & bone Graft/Polio  . polio surgeries      14 polio surgeries   . PROSTATE BIOPSY N/A 09/28/2014   Procedure: PROSTATE ULTRASOUND/BIOPSY;  Surgeon: [name], MD;  Location: [facility];  Service: Urology;  Laterality: N/A;  . PROSTATE BIOPSY N/A 10/25/2015   Procedure: PROSTATE BIOPSY AND ULTRASOUND;  Surgeon: [name], MD;  Location: [facility];  Service: Urology;  Laterality: N/A;  . SAVORY DILATION N/A 03/22/2015   Procedure: SAVORY DILATION;  Surgeon: [name], MD;  Location: [facility];  Service: Endoscopy;  Laterality: N/A;  . SHOULDER ARTHROSCOPY WITH OPEN ROTATOR CUFF REPAIR Bilateral 2013  &  1999   removal spurs and bursectomy  . TRANSURETHRAL INCISION OF BLADDER NECK N/A 10/25/2015   Procedure:  TRANSURETHRAL INCISION OF BLADDER NECK;  Surgeon: [name], MD;  Location: [facility];  Service: Urology;  Laterality: N/A;  . TRANSURETHRAL RESECTION OF PROSTATE N/A 09/28/2014   Procedure: TRANSURETHRAL RESECTION OF THE PROSTATE (TURP);  Surgeon: [name], MD;  Location: [facility];  Service: Urology;  Laterality: N/A;  . Parc EXTRACTION  2000    Social History   Social History  . Marital status: Married    Spouse name: N/A  . Number of children: N/A  . Years of education: N/A   Occupational History  . Retired    Social History Main Topics  . Smoking status: Former Smoker    Years: 47.00    Quit date: 08/25/2013  . Smokeless tobacco: Former Systems developer  . Alcohol use 3.6 oz/week    6 Cans of beer per week     Comment: daily  . Drug use: No     Comment: hx of marijuana use   . Sexual activity: Yes    Birth control/ protection: None   Other Topics Concern  . Not on file   Social History Narrative  . No narrative on file    Current Outpatient Prescriptions on File Prior to Visit  Medication Sig Dispense Refill  . albuterol (PROVENTIL HFA;VENTOLIN HFA) 108 (90 Base) MCG/ACT inhaler Inhale 2 puffs into the lungs every 6 (six) hours as needed for wheezing or shortness of breath. 3 Inhaler 3  . ALPRAZolam (XANAX) 1 MG tablet Take 0.5 tablets (0.5 mg total) by mouth 3 (three) times daily as needed for anxiety. 90 tablet 0   . amLODipine (NORVASC) 10 MG tablet Take 1 tablet (10 mg total) by mouth daily. 90 tablet 0  . baclofen (LIORESAL) 10 MG tablet Take 1 tablet (10 mg total) by mouth 2 (two) times daily. (Patient taking differently: Take 10 mg by mouth daily as needed (for leg cramps). ) 60 each 0  . Blood Glucose Monitoring Suppl (ACCU-CHEK AVIVA) device Use  to check blood sugars every morning fasting, 2 hours after largest meal and as needed with low blood sugar symptoms 1 each 0  . dexlansoprazole (DEXILANT) 60 MG capsule Take 1 capsule (60 mg total) by mouth daily. (Patient taking differently: Take 60 mg by mouth every other day. ) 90 capsule 3  . EPINEPHrine 0.3 mg/0.3 mL IJ SOAJ injection Inject 0.3 mLs (0.3 mg total) into the muscle once. 1 Device 1  . fluticasone (FLONASE) 50 MCG/ACT nasal spray Place 2 sprays into both nostrils daily. 16 g 6  . fluticasone furoate-vilanterol (BREO ELLIPTA) 100-25 MCG/INH AEPB INHALE 1 PUFF BY MOUTH EVERY MORNING 180 each 3  . glucose blood (ACCU-CHEK AVIVA) test strip Check blood sugars every morning fasting, 2 hours after largest meal and as needed with low blood sugar symptoms 100 each 12  . HYDROcodone-acetaminophen (NORCO/VICODIN) 5-325 MG tablet Take 1 tablet by mouth every 6 (six) hours as needed for moderate pain.    Marland Kitchen ipratropium-albuterol (DUONEB) 0.5-2.5 (3) MG/3ML SOLN Take 3 mLs by nebulization every 4 (four) hours as needed (shortness of breath and wheezing).    Elmore Guise Devices (ACCU-CHEK SOFTCLIX) lancets Check blood sugars every morning fasting, 2 hours after largest meal and as needed with low blood sugar symptoms 100 each 12  . losartan (COZAAR) 50 MG tablet Take one tab q am for BP 90 tablet 1  . meclizine (ANTIVERT) 25 MG tablet Take 25 mg by mouth 3 (three) times daily as needed for dizziness.    . metoprolol succinate (TOPROL-XL) 25 MG 24 hr tablet Take 1 tablet (25 mg total) by mouth daily. 30 tablet 0  . montelukast (SINGULAIR) 10 MG tablet TAKE 1  TABLET (10 MG TOTAL) BY MOUTH AT BEDTIME. (Patient taking differently: Take 10 mg by mouth at bedtime as needed (for allergies.). ) 90 tablet 1  . nitroGLYCERIN (NITROSTAT) 0.4 MG SL tablet Place 1 tablet (0.4 mg total) under the tongue every 5 (five) minutes as needed for chest pain. 30 tablet 3  . ondansetron (ZOFRAN) 8 MG tablet Take 8 mg by mouth daily as needed.     . OXYGEN Inhale 2 L into the lungs at bedtime. Reported on 11/25/2015    . oxymetazoline (AFRIN) 0.05 % nasal spray Place 1 spray into both nostrils 4 (four) times daily as needed for congestion.    Marland Kitchen atorvastatin (LIPITOR) 40 MG tablet One tab q hs (Patient not taking: Reported on 07/17/2016) 90 tablet 1   No current facility-administered medications on file prior to visit.     Allergies  Allergen Reactions  . Ivp Dye [Iodinated Diagnostic Agents] Anaphylaxis  . Levaquin [Levofloxacin In D5w] Shortness Of Breath and Swelling    In addition: sweating, chest pain, and diarrhea.   . Penicillins Anaphylaxis    Heart stops Has patient had a PCN reaction causing immediate rash, facial/tongue/throat swelling, SOB or lightheadedness with hypotension: yes Has patient had a PCN reaction causing severe rash involving mucus membranes or skin necrosis: yes Has patient had a PCN reaction that required hospitalization yes Has patient had a PCN reaction occurring within the last 10 years: No If all of the above answers are "NO", then may proceed with Cephalosporin use.   . Aspirin Other (See Comments)    Reaction unknown  . Nsaids Other (See Comments)    Difficulty breathing  . Tolmetin Other (See Comments)    Difficulty breathing  . Buprenorphine Hcl Nausea And Vomiting    Can take with  zofran   . Morphine And Related Nausea And Vomiting    Can take with zofran   . Oxycodone Itching and Rash    Family History  Problem Relation Age of Onset  . Alzheimer's disease Father 4    Deceased  . Stomach cancer Father   . Heart attack  Father   . Heart disease Father   . Skin cancer Mother     Facial-Living  . Cancer Mother     skin  . Alcohol abuse Sister     x2  . Mental illness Sister     x2  . Alcohol abuse Sister   . Diabetes Maternal Aunt     x2  . Thyroid disease Maternal Aunt     x4  . Diabetes Maternal Uncle   . Tuberculosis Paternal Grandfather   . Tuberculosis Paternal Grandmother   . Alzheimer's disease Paternal Aunt   . Alzheimer's disease Paternal Uncle   . Colon cancer Neg Hx   . Colon polyps Neg Hx   . Crohn's disease Neg Hx   . Ulcerative colitis Neg Hx     BP (!) 112/56   Pulse (!) 59   Ht 6' (1.829 m)   Wt 223 lb (101.2 kg)   SpO2 97%   BMI 30.24 kg/m    Review of Systems denies blurry vision, n/v, excessive diaphoresis, and depression.  He has weight gain and headache.  He has chronic sob and frequent urination. He had recent chest pain (treadmill sched for next week).  He has chronic leg cramps, cold intolerance, easy bruising, and rhinorrhea.      Objective:   Physical Exam VS: see vs page GEN: no distress HEAD: head: no deformity eyes: no periorbital swelling, no proptosis external nose and ears are normal mouth: no lesion seen NECK: supple, thyroid is not enlarged.   CHEST WALL: no deformity LUNGS: clear to auscultation CV: reg rate and rhythm, no murmur ABD: abdomen is soft, nontender.  no hepatosplenomegaly.  not distended.  no hernia MUSCULOSKELETAL: muscle bulk and strength are grossly normal.  no obvious joint swelling.  gait is normal and steady EXTEMITIES: no deformity.  no ulcer on the feet.  feet are of normal color and temp.  no edema PULSES: dorsalis pedis intact bilat.  no carotid bruit NEURO:  cn 2-12 grossly intact.   readily moves all 4's.  sensation is intact to touch on the feet SKIN:  Normal texture and temperature.  No rash or suspicious lesion is visible.  Old healed surgical scars at the right foot.   NODES:  None palpable at the neck PSYCH:  alert, well-oriented.  Does not appear anxious nor depressed.  I have reviewed outside records, and summarized: Pt was noted to have retinopathy despite near-normal a1c, and referred here.   CT (2016): No appreciable abnormality of the pancreas  Lab Results  Component Value Date   CHOL 181 07/17/2016   HDL 48.00 07/17/2016   LDLCALC 150 (H) 05/07/2016   LDLDIRECT 103.0 07/17/2016   TRIG 245.0 (H) 07/17/2016   CHOLHDL 4 07/17/2016   a1c=6.5%    Assessment & Plan:  Type 2 DM: he needs rx, if it can be done with a regimen that avoids or minimizes hypoglycemia. Reactive hypoglycemia, new to me, which is often seen in the early stages of DM. Retinopathy: it is sometimes seen early in DM, but it is uncommon.  Controlling dyslipidemia will help this also (it is much better on rx).  Patient  is advised the following: Patient Instructions  good diet and exercise significantly improve the control of your diabetes.  please let me know if you wish to be referred to a dietician.  high blood sugar is very risky to your health.  you should see an eye doctor and dentist every year.  It is very important to get all recommended vaccinations.   Controlling your blood pressure and cholesterol drastically reduces the damage diabetes does to your body.  Those who smoke should quit.  Please discuss these with your doctor.  You do have diabetes.  However, it is common for people in the early stages of diabetes to have hypoglycemia after eating. blood tests are requested for you today.  We'll let you know about the results. The cholesterol medication also help protect your eyes.   check your blood sugar once a day.  vary the time of day when you check, between before the 3 meals, and at bedtime.  also check if you have symptoms of your blood sugar being too low.  please keep a record of the readings and bring it to your next appointment here (or you can bring the meter itself).  You can write it on any piece of  paper.  please call us sooner if your blood sugar goes below 70, or if you have a lot of readings over 200. I have sent a prescription to your pharmacy, for the metformin-XR.   Please come back for a follow-up appointment in 2 months.

## 2016-07-18 ENCOUNTER — Ambulatory Visit: Payer: Self-pay

## 2016-07-19 ENCOUNTER — Ambulatory Visit (INDEPENDENT_AMBULATORY_CARE_PROVIDER_SITE_OTHER): Payer: Medicare Other

## 2016-07-19 ENCOUNTER — Telehealth (HOSPITAL_COMMUNITY): Payer: Self-pay

## 2016-07-19 VITALS — BP 115/74 | HR 58 | Temp 98.2°F

## 2016-07-19 DIAGNOSIS — Z23 Encounter for immunization: Secondary | ICD-10-CM

## 2016-07-19 LAB — FRUCTOSAMINE: Fructosamine: 221 umol/L (ref 190–270)

## 2016-07-19 NOTE — Telephone Encounter (Signed)
Encounter complete. 

## 2016-07-20 ENCOUNTER — Telehealth (HOSPITAL_COMMUNITY): Payer: Self-pay

## 2016-07-20 NOTE — Telephone Encounter (Signed)
Encounter complete. 

## 2016-07-21 ENCOUNTER — Encounter (HOSPITAL_BASED_OUTPATIENT_CLINIC_OR_DEPARTMENT_OTHER): Payer: Self-pay | Admitting: Emergency Medicine

## 2016-07-21 ENCOUNTER — Observation Stay (HOSPITAL_BASED_OUTPATIENT_CLINIC_OR_DEPARTMENT_OTHER)
Admission: EM | Admit: 2016-07-21 | Discharge: 2016-07-23 | Disposition: A | Discharge status: Home / Self Care | Payer: Medicare Other | Attending: Cardiovascular Disease | Admitting: Cardiovascular Disease

## 2016-07-21 ENCOUNTER — Emergency Department (HOSPITAL_BASED_OUTPATIENT_CLINIC_OR_DEPARTMENT_OTHER): Payer: Medicare Other

## 2016-07-21 DIAGNOSIS — J449 Chronic obstructive pulmonary disease, unspecified: Secondary | ICD-10-CM | POA: Diagnosis not present

## 2016-07-21 DIAGNOSIS — Z87891 Personal history of nicotine dependence: Secondary | ICD-10-CM | POA: Insufficient documentation

## 2016-07-21 DIAGNOSIS — R079 Chest pain, unspecified: Secondary | ICD-10-CM | POA: Diagnosis not present

## 2016-07-21 DIAGNOSIS — R0602 Shortness of breath: Secondary | ICD-10-CM | POA: Insufficient documentation

## 2016-07-21 DIAGNOSIS — Z79899 Other long term (current) drug therapy: Secondary | ICD-10-CM | POA: Diagnosis not present

## 2016-07-21 DIAGNOSIS — R0789 Other chest pain: Secondary | ICD-10-CM | POA: Diagnosis not present

## 2016-07-21 DIAGNOSIS — E119 Type 2 diabetes mellitus without complications: Secondary | ICD-10-CM | POA: Insufficient documentation

## 2016-07-21 DIAGNOSIS — I10 Essential (primary) hypertension: Secondary | ICD-10-CM | POA: Insufficient documentation

## 2016-07-21 DIAGNOSIS — K219 Gastro-esophageal reflux disease without esophagitis: Secondary | ICD-10-CM | POA: Diagnosis present

## 2016-07-21 LAB — CBC
HCT: 38.5 % — ABNORMAL LOW (ref 39.0–52.0)
HEMOGLOBIN: 13.1 g/dL (ref 13.0–17.0)
MCH: 31.6 pg (ref 26.0–34.0)
MCHC: 34 g/dL (ref 30.0–36.0)
MCV: 92.8 fL (ref 78.0–100.0)
Platelets: 225 10*3/uL (ref 150–400)
RBC: 4.15 MIL/uL — AB (ref 4.22–5.81)
RDW: 12.3 % (ref 11.5–15.5)
WBC: 6.8 10*3/uL (ref 4.0–10.5)

## 2016-07-21 LAB — GLUCOSE, CAPILLARY: Glucose-Capillary: 166 mg/dL — ABNORMAL HIGH (ref 65–99)

## 2016-07-21 LAB — BASIC METABOLIC PANEL
Anion gap: 7 (ref 5–15)
BUN: 17 mg/dL (ref 6–20)
CHLORIDE: 106 mmol/L (ref 101–111)
CO2: 23 mmol/L (ref 22–32)
CREATININE: 1.16 mg/dL (ref 0.61–1.24)
Calcium: 8.5 mg/dL — ABNORMAL LOW (ref 8.9–10.3)
GFR calc non Af Amer: >60 mL/min (ref >60)
Glucose, Bld: 102 mg/dL — ABNORMAL HIGH (ref 65–99)
POTASSIUM: 4 mmol/L (ref 3.5–5.1)
SODIUM: 136 mmol/L (ref 135–145)

## 2016-07-21 LAB — D-DIMER, QUANTITATIVE (NOT AT ARMC): D DIMER QUANT: 0.28 ug{FEU}/mL (ref 0.00–0.50)

## 2016-07-21 LAB — TROPONIN I
Troponin I: <0.03 ng/mL (ref <0.03)
Troponin I: <0.03 ng/mL (ref <0.03)

## 2016-07-21 MED ORDER — ALPRAZOLAM 0.5 MG PO TABS
1.0000 mg | ORAL_TABLET | Freq: Once | ORAL | Status: AC
  Administered 2016-07-21: 1 mg via ORAL
  Filled 2016-07-21: qty 2

## 2016-07-21 MED ORDER — MONTELUKAST SODIUM 10 MG PO TABS
10.0000 mg | ORAL_TABLET | Freq: Every evening | ORAL | Status: DC | PRN
  Administered 2016-07-23: 10 mg via ORAL
  Filled 2016-07-21: qty 1

## 2016-07-21 MED ORDER — ONDANSETRON HCL 4 MG/2ML IJ SOLN
4.0000 mg | Freq: Once | INTRAMUSCULAR | Status: AC
  Administered 2016-07-21: 4 mg via INTRAVENOUS
  Filled 2016-07-21: qty 2

## 2016-07-21 MED ORDER — PANTOPRAZOLE SODIUM 40 MG PO TBEC
40.0000 mg | DELAYED_RELEASE_TABLET | Freq: Every day | ORAL | Status: DC
  Administered 2016-07-22 – 2016-07-23 (×2): 40 mg via ORAL
  Filled 2016-07-21 (×2): qty 1

## 2016-07-21 MED ORDER — ASPIRIN 81 MG PO CHEW
324.0000 mg | CHEWABLE_TABLET | Freq: Once | ORAL | Status: DC
  Filled 2016-07-21: qty 4

## 2016-07-21 MED ORDER — SODIUM CHLORIDE 0.9 % IV SOLN
INTRAVENOUS | Status: AC
  Administered 2016-07-22 (×2): via INTRAVENOUS

## 2016-07-21 MED ORDER — MORPHINE SULFATE (PF) 4 MG/ML IV SOLN
4.0000 mg | Freq: Once | INTRAVENOUS | Status: AC
  Administered 2016-07-21: 4 mg via INTRAVENOUS
  Filled 2016-07-21: qty 1

## 2016-07-21 MED ORDER — FLUTICASONE PROPIONATE 50 MCG/ACT NA SUSP
2.0000 | Freq: Every day | NASAL | Status: DC
  Administered 2016-07-23: 2 via NASAL
  Filled 2016-07-21: qty 16

## 2016-07-21 MED ORDER — ALBUTEROL SULFATE HFA 108 (90 BASE) MCG/ACT IN AERS: 2.0000 | INHALATION_SPRAY | Freq: Four times a day (QID) | RESPIRATORY_TRACT | Status: DC | PRN

## 2016-07-21 MED ORDER — ALPRAZOLAM 0.5 MG PO TABS
0.5000 mg | ORAL_TABLET | Freq: Three times a day (TID) | ORAL | Status: DC | PRN
  Administered 2016-07-22 – 2016-07-23 (×4): 0.5 mg via ORAL
  Filled 2016-07-21 (×4): qty 1

## 2016-07-21 MED ORDER — SODIUM CHLORIDE 0.9 % IV BOLUS (SEPSIS)
1000.0000 mL | Freq: Once | INTRAVENOUS | Status: AC
  Administered 2016-07-21: 1000 mL via INTRAVENOUS

## 2016-07-21 MED ORDER — IPRATROPIUM-ALBUTEROL 0.5-2.5 (3) MG/3ML IN SOLN: 3.0000 mL | RESPIRATORY_TRACT | Status: DC | PRN

## 2016-07-21 MED ORDER — FLUTICASONE FUROATE-VILANTEROL 100-25 MCG/INH IN AEPB
1.0000 | INHALATION_SPRAY | Freq: Every day | RESPIRATORY_TRACT | Status: DC
  Administered 2016-07-22 – 2016-07-23 (×2): 1 via RESPIRATORY_TRACT
  Filled 2016-07-21 (×2): qty 28

## 2016-07-21 MED ORDER — ATORVASTATIN CALCIUM 40 MG PO TABS
40.0000 mg | ORAL_TABLET | Freq: Every day | ORAL | Status: DC
  Administered 2016-07-22: 40 mg via ORAL
  Filled 2016-07-21: qty 1

## 2016-07-21 MED ORDER — NITROGLYCERIN 0.4 MG SL SUBL
0.4000 mg | SUBLINGUAL_TABLET | SUBLINGUAL | Status: DC | PRN
  Administered 2016-07-21 (×3): 0.4 mg via SUBLINGUAL
  Filled 2016-07-21: qty 1

## 2016-07-21 MED ORDER — SODIUM CHLORIDE 0.9% FLUSH
3.0000 mL | Freq: Two times a day (BID) | INTRAVENOUS | Status: DC
  Administered 2016-07-21 – 2016-07-23 (×3): 3 mL via INTRAVENOUS

## 2016-07-21 NOTE — ED Notes (Signed)
Family at bedside. 

## 2016-07-21 NOTE — ED Triage Notes (Signed)
Pt in c/o several sx onset this am. States went to [employer] and began feeling lightheaded and like he may pass out, then approx 15-20 PTA began to have central chest pain described as pulling. Pt appears SOB. Pt is alert, interactive, ambulatory to triage in NAD.

## 2016-07-21 NOTE — H&P (Addendum)
History and Physical  Primary Cardiologist: Stanford Breed PCP: [name], DO  Chief Complaint: chest pain/dizziness/NSVT at [facility] ED  HPI:   65 year old male with history of atypical angina, COPD on 2L O2 at night, HTN, DM (recently prescribed metformin), HLD, anxiety with acute onset of lightheadedness and dizziness with subsequent chest pain.  [place] where [place] started feeling pre-syncopal and had associated chest pain, worse with cough.  Earlier in the day, noticed dry mouth and "couldn't even spit" he had soo little saliva.  Drank a can of beer at lunch.  Progressively felt all of these symptoms worsen throughout the day, and took his BP which was low prompting presentation to [facility] ED.  Over the past few weeks, he denies other chest pain, SOB above usual, edema, orthopnea, PND, bleeding/bruising or other illness.  At the OSH, he reportedly had a period of symptomatic NSVT.  ECG was without significant ischemic changes.  He was given NTG x3 and this did not help his angina.  He also got zofran, morphine, 1L NS.  He notes that gradually some chest pressure he had eased off over time prior to arrival at [facility].  No recurrence but did have some mild chest discomfort while eating here at [facility] which was brief.  On interview, mouth still dry-- BP 82/64, p 60.  Did not receive ASA given report of allergy.  D-dimer negative at OSH.  Given run of NSVT, Dr. Chesley Noon Sam requesting cardiology admission.    Recently was admitted 1 month ago for chest pain and was ruled out with negative troponin and discharged after TTE wnl.  Dr. Stanford Breed was planning for exercise treadmill test planned for Tuesday.   Prior Studies TTE 06/26/16 Study Conclusions  - Left ventricle: The cavity size was normal. Systolic function was   normal. The estimated ejection fraction was in the range of 55%   to 60%. Wall motion was normal; there were no regional wall   motion abnormalities.  Left ventricular diastolic function   parameters were normal. - Left atrium: The atrium was moderately dilated. - Atrial septum: No defect or patent foramen ovale was identified   Past Medical History:  Diagnosis Date  . Anxiety   . Anxiety disorder   . Arthritis    BILATERAL SHOULDERS, ELBOWS AND HANDS AND LEFT HIP AND KNEES--HX CORTISONE SHOTS IN SHOULDERS, ELBOWS, HIP AND KNEES  . Basal cell carcinoma of back 04/30/2016   Dermatologist- Dr Nevada Crane;   MOHS sx- Dr Levada Dy   . Bladder outlet obstruction   . Chronic idiopathic constipation 05/08/2015  . Complication of anesthesia    DIFFICULT WAKING   . COPD (chronic obstructive pulmonary disease) Gold C Frequent exacerbations 01/13/2014   [name] 07/08/14: FeV1 51% FeV1/FVC 66% FVC 59% 10/5/2015ONO RA was normal  10/13/2014  ONO on RA NORMAL   . COPD, frequent exacerbations ([place])    pulmologist-  dr Joya Gaskins--  [place] Rm Stage C.04-25-15 recent COPD exacerbation-much improved now, after tx. in ER [facility].  Marland Kitchen Depression   . Diabetes mellitus without complication ([place])    BODERLINE - DIET CONTROL  . Dysrhythmia    PVC'S  . Family history of adverse reaction to anesthesia    father would wake up with agitation   . Former smoker 01/13/2014  . Gastroesophageal reflux disease without esophagitis   . GERD (gastroesophageal reflux disease)   . Heavy alcohol use 04/30/2016  . History of chronic bronchitis   . History  of oxygen administration    oxygen use 2 l/m nasally at bedtime and exertional occasions  . History of rheumatic fever   . History of TB (tuberculosis)    1984--  hospitalized for 4 month treatment  . History of urinary retention   . Hx of multiple concussions    x 2 per patient   . Hypertension   . Melanoma ([place])   . Nocturnal oxygen desaturation    USES O2 NIGHTLY  . OSA (obstructive sleep apnea) 01/13/2014  . Pneumonia    hx of   . PONV (postoperative nausea and vomiting)   . Post-polio syndrome    polio at age 38--PT WAS IN  IRON LUNG; PT WAS IN W/C UNTIL AGE 26; STILL HAS WEAKNESS RIGHT SIDE  . Prostate cancer ([place])   . Prostate cancer ([place])   . Schizophrenia ([place])   . Shortness of breath dyspnea    RIGHT HEMIDIAPHRAGM ELEVATION - POST POLIO SYNDROME  . Tuberculosis    Hosp 4 months rx , left early     Past Surgical History:  Procedure Laterality Date  . CARDIOVASCULAR STRESS TEST  06-08-2014  dr Mare Ferrari   normal lexiscan study/  no ischemia/  not gated due to PAC's  . COLONOSCOPY N/A 05/03/2015   Procedure: COLONOSCOPY;  Surgeon: [name], MD;  Location: [facility];  Service: Endoscopy;  Laterality: N/A;  . CYSTOSCOPY N/A 10/25/2015   Procedure: CYSTOSCOPY;  Surgeon: [name], MD;  Location: [facility];  Service: Urology;  Laterality: N/A;  . CYSTOSCOPY W/ CYSTOGRAM/  TRANSRECTAL ULTRASOUND PROSTATE BX  03-22-2009  . ESOPHAGOGASTRODUODENOSCOPY N/A 03/22/2015   Procedure: ESOPHAGOGASTRODUODENOSCOPY (EGD) with dilation;  Surgeon: [name], MD;  Location: [facility];  Service: Endoscopy;  Laterality: N/A;  . excision of skin lesion    . LAPAROSCOPIC CHOLECYSTECTOMY  2013  . left elbow surgery      due to fracture   . NASAL SEPTUM SURGERY  2000  . OTHER SURGICAL HISTORY      Muscle & bone Graft/Polio  . polio surgeries      14 polio surgeries   . PROSTATE BIOPSY N/A 09/28/2014   Procedure: PROSTATE ULTRASOUND/BIOPSY;  Surgeon: [name], MD;  Location: [facility];  Service: Urology;  Laterality: N/A;  . PROSTATE BIOPSY N/A 10/25/2015   Procedure: PROSTATE BIOPSY AND ULTRASOUND;  Surgeon: [name], MD;  Location: [facility];  Service: Urology;  Laterality: N/A;  . SAVORY DILATION N/A 03/22/2015   Procedure: SAVORY DILATION;  Surgeon: [name], MD;  Location: [facility];  Service: Endoscopy;  Laterality: N/A;  . SHOULDER ARTHROSCOPY WITH OPEN ROTATOR CUFF REPAIR Bilateral 2013  &  1999   removal spurs and bursectomy  . TRANSURETHRAL INCISION OF BLADDER NECK N/A 10/25/2015   Procedure:   TRANSURETHRAL INCISION OF BLADDER NECK;  Surgeon: [name], MD;  Location: [facility];  Service: Urology;  Laterality: N/A;  . TRANSURETHRAL RESECTION OF PROSTATE N/A 09/28/2014   Procedure: TRANSURETHRAL RESECTION OF THE PROSTATE (TURP);  Surgeon: [name], MD;  Location: [facility];  Service: Urology;  Laterality: N/A;  . URETEROSOPY STONE EXTRACTION  2000    Family History  Problem Relation Age of Onset  . Alzheimer's disease Father 4    Deceased  . Stomach cancer Father   . Heart attack Father   . Heart disease Father   . Skin cancer Mother     Facial-Living  . Cancer Mother     skin  .  Alcohol abuse Sister     x2  . Mental illness Sister     x2  . Alcohol abuse Sister   . Diabetes Maternal Aunt     x2  . Thyroid disease Maternal Aunt     x4  . Diabetes Maternal Uncle   . Tuberculosis Paternal Grandfather   . Tuberculosis Paternal Grandmother   . Alzheimer's disease Paternal Aunt   . Alzheimer's disease Paternal Uncle   . Colon cancer Neg Hx   . Colon polyps Neg Hx   . Crohn's disease Neg Hx   . Ulcerative colitis Neg Hx    Social History:  reports that he quit smoking about 2 years ago. He quit after 47.00 years of use. He has quit using smokeless tobacco. He reports that he drinks about 3.6 oz of alcohol per week . He reports that he does not use drugs.  Allergies:  Allergies  Allergen Reactions  . Ivp Dye [Iodinated Diagnostic Agents] Anaphylaxis  . Levaquin [Levofloxacin In D5w] Shortness Of Breath and Swelling    In addition: sweating, chest pain, and diarrhea.   . Penicillins Anaphylaxis    Heart stops Has patient had a PCN reaction causing immediate rash, facial/tongue/throat swelling, SOB or lightheadedness with hypotension: yes Has patient had a PCN reaction causing severe rash involving mucus membranes or skin necrosis: yes Has patient had a PCN reaction that required hospitalization yes Has patient had a PCN reaction occurring within the last 10 years:  No If all of the above answers are "NO", then may proceed with Cephalosporin use.   . Aspirin Other (See Comments)    Reaction unknown  . Nsaids Other (See Comments)    Difficulty breathing  . Tolmetin Other (See Comments)    Difficulty breathing  . Buprenorphine Hcl Nausea And Vomiting    Can take with zofran   . Morphine And Related Nausea And Vomiting    Can take with zofran   . Oxycodone Itching and Rash    No current facility-administered medications on file prior to encounter.    Current Outpatient Prescriptions on File Prior to Encounter  Medication Sig Dispense Refill  . albuterol (PROVENTIL HFA;VENTOLIN HFA) 108 (90 Base) MCG/ACT inhaler Inhale 2 puffs into the lungs every 6 (six) hours as needed for wheezing or shortness of breath. 3 Inhaler 3  . ALPRAZolam (XANAX) 1 MG tablet Take 0.5 tablets (0.5 mg total) by mouth 3 (three) times daily as needed for anxiety. 90 tablet 0  . amLODipine (NORVASC) 10 MG tablet Take 1 tablet (10 mg total) by mouth daily. 90 tablet 0  . atorvastatin (LIPITOR) 40 MG tablet One tab q hs 90 tablet 1  . baclofen (LIORESAL) 10 MG tablet Take 1 tablet (10 mg total) by mouth 2 (two) times daily. (Patient taking differently: Take 10 mg by mouth daily as needed (for leg cramps). ) 60 each 0  . Blood Glucose Monitoring Suppl (ACCU-CHEK AVIVA) device Use to check blood sugars every morning fasting, 2 hours after largest meal and as needed with low blood sugar symptoms 1 each 0  . dexlansoprazole (DEXILANT) 60 MG capsule Take 1 capsule (60 mg total) by mouth daily. (Patient taking differently: Take 60 mg by mouth every other day. ) 90 capsule 3  . EPINEPHrine 0.3 mg/0.3 mL IJ SOAJ injection Inject 0.3 mLs (0.3 mg total) into the muscle once. 1 Device 1  . fluticasone (FLONASE) 50 MCG/ACT nasal spray Place 2 sprays into both nostrils daily. 16 g  6  . fluticasone furoate-vilanterol (BREO ELLIPTA) 100-25 MCG/INH AEPB INHALE 1 PUFF BY MOUTH EVERY MORNING 180 each  3  . glucose blood (ACCU-CHEK AVIVA) test strip Check blood sugars every morning fasting, 2 hours after largest meal and as needed with low blood sugar symptoms 100 each 12  . HYDROcodone-acetaminophen (NORCO/VICODIN) 5-325 MG tablet Take 1 tablet by mouth every 6 (six) hours as needed for moderate pain.    Marland Kitchen ipratropium-albuterol (DUONEB) 0.5-2.5 (3) MG/3ML SOLN Take 3 mLs by nebulization every 4 (four) hours as needed (shortness of breath and wheezing).    Elmore Guise Devices (ACCU-CHEK SOFTCLIX) lancets Check blood sugars every morning fasting, 2 hours after largest meal and as needed with low blood sugar symptoms 100 each 12  . losartan (COZAAR) 50 MG tablet Take one tab q am for BP 90 tablet 1  . meclizine (ANTIVERT) 25 MG tablet Take 25 mg by mouth 3 (three) times daily as needed for dizziness.    . metFORMIN (GLUCOPHAGE-XR) 500 MG 24 hr tablet Take 1 tablet (500 mg total) by mouth daily with breakfast. 90 tablet 3  . metoprolol succinate (TOPROL-XL) 25 MG 24 hr tablet Take 1 tablet (25 mg total) by mouth daily. 30 tablet 0  . montelukast (SINGULAIR) 10 MG tablet TAKE 1 TABLET (10 MG TOTAL) BY MOUTH AT BEDTIME. (Patient taking differently: Take 10 mg by mouth at bedtime as needed (for allergies.). ) 90 tablet 1  . nitroGLYCERIN (NITROSTAT) 0.4 MG SL tablet Place 1 tablet (0.4 mg total) under the tongue every 5 (five) minutes as needed for chest pain. 30 tablet 3  . ondansetron (ZOFRAN) 8 MG tablet Take 8 mg by mouth daily as needed.     . OXYGEN Inhale 2 L into the lungs at bedtime. Reported on 11/25/2015    . oxymetazoline (AFRIN) 0.05 % nasal spray Place 1 spray into both nostrils 4 (four) times daily as needed for congestion.      Results for orders placed or performed during the hospital encounter of 07/21/16 (from the past 48 hour(s))  Troponin I     Status: None   Collection Time: 07/21/16  4:30 PM  Result Value Ref Range   Troponin I <0.03 <0.03 ng/mL  CBC     Status: Abnormal    Collection Time: 07/21/16  4:45 PM  Result Value Ref Range   WBC 6.8 4.0 - 10.5 K/uL   RBC 4.15 (L) 4.22 - 5.81 MIL/uL   Hemoglobin 13.1 13.0 - 17.0 g/dL   HCT 38.5 (L) 39.0 - 52.0 %   MCV 92.8 78.0 - 100.0 fL   MCH 31.6 26.0 - 34.0 pg   MCHC 34.0 30.0 - 36.0 g/dL   RDW 12.3 11.5 - 15.5 %   Platelets 225 150 - 400 K/uL  Basic metabolic panel     Status: Abnormal   Collection Time: 07/21/16  5:15 PM  Result Value Ref Range   Sodium 136 135 - 145 mmol/L   Potassium 4.0 3.5 - 5.1 mmol/L   Chloride 106 101 - 111 mmol/L   CO2 23 22 - 32 mmol/L   Glucose, Bld 102 (H) 65 - 99 mg/dL   BUN 17 6 - 20 mg/dL   Creatinine, Ser 1.16 0.61 - 1.24 mg/dL   Calcium 8.5 (L) 8.9 - 10.3 mg/dL   GFR calc non Af Amer >60 >60 mL/min   GFR calc Af Amer >60 >60 mL/min    Comment: (NOTE) The eGFR has been calculated  using the CKD EPI equation. This calculation has not been validated in all clinical situations. eGFR's persistently <60 mL/min signify possible Chronic Kidney Disease.    Anion gap 7 5 - 15  D-dimer, quantitative (not at [facility])     Status: None   Collection Time: 07/21/16  5:15 PM  Result Value Ref Range   D-Dimer, Quant 0.28 0.00 - 0.50 ug/mL-FEU    Comment: (NOTE) At the manufacturer cut-off of 0.50 ug/mL FEU, this assay has been documented to exclude PE with a sensitivity and negative predictive value of 97 to 99%.  At this time, this assay has not been approved by the FDA to exclude DVT/VTE. Results should be correlated with clinical presentation.   Troponin I     Status: None   Collection Time: 07/21/16  7:25 PM  Result Value Ref Range   Troponin I <0.03 <0.03 ng/mL  Glucose, capillary     Status: Abnormal   Collection Time: 07/21/16  9:23 PM  Result Value Ref Range   Glucose-Capillary 166 (H) 65 - 99 mg/dL   Dg Chest 2 View  Result Date: 07/21/2016 CLINICAL DATA:  Severe LEFT-sided chest pain began 30 minutes ago. EXAM: CHEST  2 VIEW COMPARISON:  06/25/2016. FINDINGS: Low  lung volumes. Cardiomegaly. Mild vascular congestion. No focal infiltrates or failure. No effusion or pneumothorax. Bones unremarkable. Mild chronic biapical pleural thickening. IMPRESSION: Low lung volumes, without focal infiltrates or CHF. Electronically Signed   By: Staci Righter M.D.   On: 07/21/2016 17:36    ECG/Tele: NSR/sinus bradycardia; (poor r wave progression on recent ECG likely due to lead placement)  ROS: As above. Otherwise, review of systems is negative unless per above HPI  Vitals:   07/21/16 1840 07/21/16 1905 07/21/16 1930 07/21/16 2120  BP: 118/69 112/56 105/65 (!) 112/52  Pulse: (!) 58 62 63 60  Resp: '20 18 20 20  ' Temp:    98.3 F (36.8 C)  TempSrc:    Oral  SpO2: 100% 99% 98% 98%  Weight:    100.3 kg (221 lb 3.2 oz)  Height:    6' (1.829 m)   Wt Readings from Last 10 Encounters:  07/21/16 100.3 kg (221 lb 3.2 oz)  07/17/16 101.2 kg (223 lb)  07/16/16 101.2 kg (223 lb)  07/03/16 103.9 kg (229 lb)  06/29/16 101.2 kg (223 lb)  06/26/16 102.4 kg (225 lb 12 oz)  06/12/16 101.2 kg (223 lb 3.2 oz)  06/02/16 101.6 kg (224 lb)  05/28/16 102 kg (224 lb 14.4 oz)  05/15/16 101.2 kg (223 lb)    PE:  General: No acute distress HEENT: Atraumatic, EOMI, mucous membranes dry CV: RRR no murmurs, gallops. No JVD at 45 degrees. No HJR. Respiratory: Clear, no crackles. Normal work of breathing ABD: Non-distended and non-tender. No palpable organomegaly.  Extremities: 2+ radial pulses bilaterally. No LE edema. Neuro/Psych: CN grossly intact, alert and oriented  Assessment/Plan ? Dehydration HTN Atypical chest pain Anxiety HLD COPD DM Report of non-sustained VT  Possible dehydration / symptomatic low BP / symptomatic low HR - BP low and appears to have symptomatically low BP with presyncope and clinical signs of dry mucous membranes - Will hydrate overnight with NS; consider orthostatics in AM - Hold AM BP medications until re-evaluate BP (most recent 82/64) - ?  Possibly exacerbated by EtOH use/caffeine  HTN - As above, hold AM amlodipine 10 mg qd, losartan 50 mg qd until re-evaluate - Also as above, will hold metoprolol 25 mg XL  in case this is also complicating picture (HR upper 50s).  I suspect he can tolerate this though once he is rehydrated but also will hold for possible non-invasive stress testing  Atypical Chest Pain - Atypical, troponin negative to date - Cycle troponin - Plan for outpatient exercise treadmill on Tuesday, pending clinical course, may do while inpatient prior to discharge (given COPD perhaps a dobutamine stress echo is a good option if not thought to be a good exercise candidate. Not wheezing on exam, likely could tolerate Lexiscan also) - Low suspicion for ACS/need for aspirin desensitization at this time  Report of non-sustained VT at OSH ED - Watch on telemetry, check magnesium, possible ischemic eval as above  Anxiety - He denies this being a driving factor, continue home meds  HLD - Continue statin Lab Results  Component Value Date   LDLCALC 150 (H) 05/07/2016   COPD - Continue home inhalers (albuterol, Breo, duoneb prn, singulair) - 2L O2 at night  DM (diet controlled) - Not started home metformin yet due to concerns for side effects   Lolita Cram Millie Forde  MD 07/21/2016, 11:07 PM

## 2016-07-21 NOTE — ED Notes (Signed)
Patient transported to X-ray 

## 2016-07-21 NOTE — ED Notes (Signed)
MD at bedside. 

## 2016-07-21 NOTE — ED Provider Notes (Signed)
[place] DEPT [facility] Provider Note   CSN: [id number] Arrival date & time: 07/21/16  1616  By signing my name below, I, Jeanell Sparrow, attest that this documentation has been prepared under the direction and in the presence of non-physician practitioner, Anne Ng, PA-C. Electronically Signed: Jeanell Sparrow, Scribe. 07/21/2016. 4:25 PM.  History   Chief Complaint Chief Complaint  Patient presents with  . Chest Pain   The history is provided by the patient and medical records. No language interpreter was used.   HPI Comments: [name] is a 65 y.o. male who presents to the Emergency Department complaining of constant severe left-sided chest pain that started about 30 minutes ago. He reports having an onset of pain [place] Pt states the pain was initially intermittent, then became constant after getting into his truck. His pain is worse with ambulation and deep breaths. Pt describes the pain as sharp, heavy, pulling, and radiating to his head. Pt reports associated symptoms of increased SOB and nausea. He states all morning today he has felt intermittently light headed and his mouth has felt dry. He states he has a prior hx of intermittent chest pain over the past several years, but today's pain feels different from previous episodes. States now in the ED he still feels lightheaded at times.  He reports a hx of COPD, DM, prostate cancer, alcoholic cirrhosis, and hyperlipidemia. Pt denies any hx of smoking within the past 3 years, recent heavy alcohol use, use of blood thinners, recent long trips, or diaphoresis. Denies leg pain or swelling. Started losartan and atorvastatin dose change three weeks ago.   Notably pt recently saw Dr. Stanford Breed for cardiology follow up and is schedule for an exercise stress test. He has a long history of atypical chest pain and most recently had an overnight admission in 06/2016 with negative serial troponins. Mr. Peruski has  had a normal nuclear stress test in August 2015, echo in September's admission with normal LV systolic function and moderate left atrial enlargement.   PCP: [name], DO  Past Medical History:  Diagnosis Date  . Anxiety   . Anxiety disorder   . Arthritis    BILATERAL SHOULDERS, ELBOWS AND HANDS AND LEFT HIP AND KNEES--HX CORTISONE SHOTS IN SHOULDERS, ELBOWS, HIP AND KNEES  . Basal cell carcinoma of back 04/30/2016   Dermatologist- Dr Nevada Crane;   MOHS sx- Dr Levada Dy   . Bladder outlet obstruction   . Chronic idiopathic constipation 05/08/2015  . Complication of anesthesia    DIFFICULT WAKING   . COPD (chronic obstructive pulmonary disease) Gold C Frequent exacerbations 01/13/2014   [name] 07/08/14: FeV1 51% FeV1/FVC 66% FVC 59% 10/5/2015ONO RA was normal  10/13/2014  ONO on RA NORMAL   . COPD, frequent exacerbations ([place])    pulmologist-  dr Joya Gaskins--  [place] Rm Stage C.04-25-15 recent COPD exacerbation-much improved now, after tx. in ER [facility].  Marland Kitchen Depression   . Diabetes mellitus without complication ([place])    BODERLINE - DIET CONTROL  . Dysrhythmia    PVC'S  . Family history of adverse reaction to anesthesia    father would wake up with agitation   . Former smoker 01/13/2014  . Gastroesophageal reflux disease without esophagitis   . GERD (gastroesophageal reflux disease)   . Heavy alcohol use 04/30/2016  . History of chronic bronchitis   . History of oxygen administration    oxygen use 2 l/m nasally at bedtime and exertional occasions  . History  of rheumatic fever   . History of TB (tuberculosis)    1984--  hospitalized for 4 month treatment  . History of urinary retention   . Hx of multiple concussions    x 2 per patient   . Hypertension   . Melanoma ([place])   . Nocturnal oxygen desaturation    USES O2 NIGHTLY  . OSA (obstructive sleep apnea) 01/13/2014  . Pneumonia    hx of   . PONV (postoperative nausea and vomiting)   . Post-polio syndrome    polio at age 24--PT WAS IN IRON  LUNG; PT WAS IN W/C UNTIL AGE 28; STILL HAS WEAKNESS RIGHT SIDE  . Prostate cancer ([place])   . Prostate cancer ([place])   . Schizophrenia ([place])   . Shortness of breath dyspnea    RIGHT HEMIDIAPHRAGM ELEVATION - POST POLIO SYNDROME  . Tuberculosis    Hosp 4 months rx , left early       Past Surgical History:  Procedure Laterality Date  . CARDIOVASCULAR STRESS TEST  06-08-2014  dr Mare Ferrari   normal lexiscan study/  no ischemia/  not gated due to PAC's  . COLONOSCOPY N/A 05/03/2015   Procedure: COLONOSCOPY;  Surgeon: [name], MD;  Location: [facility];  Service: Endoscopy;  Laterality: N/A;  . CYSTOSCOPY N/A 10/25/2015   Procedure: CYSTOSCOPY;  Surgeon: [name], MD;  Location: [facility];  Service: Urology;  Laterality: N/A;  . CYSTOSCOPY W/ CYSTOGRAM/  TRANSRECTAL ULTRASOUND PROSTATE BX  03-22-2009  . ESOPHAGOGASTRODUODENOSCOPY N/A 03/22/2015   Procedure: ESOPHAGOGASTRODUODENOSCOPY (EGD) with dilation;  Surgeon: [name], MD;  Location: [facility];  Service: Endoscopy;  Laterality: N/A;  . excision of skin lesion    . LAPAROSCOPIC CHOLECYSTECTOMY  2013  . left elbow surgery      due to fracture   . NASAL SEPTUM SURGERY  2000  . OTHER SURGICAL HISTORY      Muscle & bone Graft/Polio  . polio surgeries      14 polio surgeries   . PROSTATE BIOPSY N/A 09/28/2014   Procedure: PROSTATE ULTRASOUND/BIOPSY;  Surgeon: [name], MD;  Location: [facility];  Service: Urology;  Laterality: N/A;  . PROSTATE BIOPSY N/A 10/25/2015   Procedure: PROSTATE BIOPSY AND ULTRASOUND;  Surgeon: [name], MD;  Location: [facility];  Service: Urology;  Laterality: N/A;  . SAVORY DILATION N/A 03/22/2015   Procedure: SAVORY DILATION;  Surgeon: [name], MD;  [facility];  Service: Endoscopy;  Laterality: N/A;  . SHOULDER ARTHROSCOPY WITH OPEN ROTATOR CUFF REPAIR Bilateral 2013  &  1999   removal spurs and bursectomy  . TRANSURETHRAL INCISION OF BLADDER NECK N/A 10/25/2015   Procedure:   TRANSURETHRAL INCISION OF BLADDER NECK;  Surgeon: [name], MD;  Location[facility];  Service: Urology;  Laterality: N/A;  . TRANSURETHRAL RESECTION OF PROSTATE N/A 09/28/2014   Procedure: TRANSURETHRAL RESECTION OF THE PROSTATE (TURP);  Surgeon: [name], MD;  Location: [facility];  Service: Urology;  Laterality: N/A;  . URETEROSOPY STONE EXTRACTION  2000       Home Medications    Prior to Admission medications   Medication Sig Start Date End Date Taking? Authorizing Provider  albuterol (PROVENTIL HFA;VENTOLIN HFA) 108 (90 Base) MCG/ACT inhaler Inhale 2 puffs into the lungs every 6 (six) hours as needed for wheezing or shortness of breath. 06/29/16   Rigoberto Noel, MD  ALPRAZolam [name]) 1 MG tablet Take 0.5 tablets (0.5 mg total) by mouth 3 (  three) times daily as needed for anxiety. 04/11/16   Debbrah Alar, NP  amLODipine (NORVASC) 10 MG tablet Take 1 tablet (10 mg total) by mouth daily. 06/06/16   [name], DO  atorvastatin (LIPITOR) 40 MG tablet One tab q hs 05/28/16   Deborah Opalski, DO  baclofen (LIORESAL) 10 MG tablet Take 1 tablet (10 mg total) by mouth 2 (two) times daily. Patient taking differently: Take 10 mg by mouth daily as needed (for leg cramps).  04/03/16   Brunetta Jeans, PA-C  Blood Glucose Monitoring Suppl (ACCU-CHEK AVIVA) device Use to check blood sugars every morning fasting, 2 hours after largest meal and as needed with low blood sugar symptoms 06/12/16 06/12/17  [name], DO  dexlansoprazole (DEXILANT) 60 MG capsule Take 1 capsule (60 mg total) by mouth daily. Patient taking differently: Take 60 mg by mouth every other day.  12/21/15   Brunetta Jeans, PA-C  EPINEPHrine 0.3 mg/0.3 mL IJ SOAJ injection Inject 0.3 mLs (0.3 mg total) into the muscle once. 02/24/16   Malvin Johns, MD  fluticasone (FLONASE) 50 MCG/ACT nasal spray Place 2 sprays into both nostrils daily. 08/03/15   Brunetta Jeans, PA-C  fluticasone furoate-vilanterol (BREO ELLIPTA) 100-25  MCG/INH AEPB INHALE 1 PUFF BY MOUTH EVERY MORNING 06/29/16   Rigoberto Noel, MD  glucose blood (ACCU-CHEK AVIVA) test strip Check blood sugars every morning fasting, 2 hours after largest meal and as needed with low blood sugar symptoms 06/12/16   [name], DO  HYDROcodone-acetaminophen (NORCO/VICODIN) 5-325 MG tablet Take 1 tablet by mouth every 6 (six) hours as needed for moderate pain.    Historical Provider, MD  ipratropium-albuterol (DUONEB) 0.5-2.5 (3) MG/3ML SOLN Take 3 mLs by nebulization every 4 (four) hours as needed (shortness of breath and wheezing).    Historical Provider, MD  Lancet Devices [facility]) lancets Check blood sugars every morning fasting, 2 hours after largest meal and as needed with low blood sugar symptoms 06/12/16   [name], DO  losartan (COZAAR) 50 MG tablet Take one tab q am for BP 05/28/16   Deborah Opalski, DO  meclizine (ANTIVERT) 25 MG tablet Take 25 mg by mouth 3 (three) times daily as needed for dizziness.    Historical Provider, MD  metFORMIN (GLUCOPHAGE-XR) 500 MG 24 hr tablet Take 1 tablet (500 mg total) by mouth daily with breakfast. 07/17/16   Renato Shin, MD  metoprolol succinate (TOPROL-XL) 25 MG 24 hr tablet Take 1 tablet (25 mg total) by mouth daily. 07/06/16   Deborah Opalski, DO  montelukast (SINGULAIR) 10 MG tablet TAKE 1 TABLET (10 MG TOTAL) BY MOUTH AT BEDTIME. Patient taking differently: Take 10 mg by mouth at bedtime as needed (for allergies.).  04/02/16   Brunetta Jeans, PA-C  nitroGLYCERIN (NITROSTAT) 0.4 MG SL tablet Place 1 tablet (0.4 mg total) under the tongue every 5 (five) minutes as needed for chest pain. 06/21/15   Brunetta Jeans, PA-C  ondansetron (ZOFRAN) 8 MG tablet Take 8 mg by mouth daily as needed.  02/21/16   Historical Provider, MD  OXYGEN Inhale 2 L into the lungs at bedtime. Reported on 11/25/2015    Historical Provider, MD  oxymetazoline (AFRIN) 0.05 % nasal spray Place 1 spray into both nostrils 4 (four) times  daily as needed for congestion.    Historical Provider, MD    Family History Family History  Problem Relation Age of Onset  . Alzheimer's disease Father 4    Deceased  .  Stomach cancer Father   . Heart attack Father   . Heart disease Father   . Skin cancer Mother     Facial-Living  . Cancer Mother     skin  . Alcohol abuse Sister     x2  . Mental illness Sister     x2  . Alcohol abuse Sister   . Diabetes Maternal Aunt     x2  . Thyroid disease Maternal Aunt     x4  . Diabetes Maternal Uncle   . Tuberculosis Paternal Grandfather   . Tuberculosis Paternal Grandmother   . Alzheimer's disease Paternal Aunt   . Alzheimer's disease Paternal Uncle   . Colon cancer Neg Hx   . Colon polyps Neg Hx   . Crohn's disease Neg Hx   . Ulcerative colitis Neg Hx     Social History Social History  Substance Use Topics  . Smoking status: Former Smoker    Years: 47.00    Quit date: 08/25/2013  . Smokeless tobacco: Former Systems developer  . Alcohol use 3.6 oz/week    6 Cans of beer per week     Comment: daily     Allergies   Ivp dye [iodinated diagnostic agents]; Levaquin [levofloxacin in d5w]; Penicillins; Aspirin; Nsaids; Tolmetin; Buprenorphine hcl; Morphine and related; and Oxycodone   Review of Systems Review of Systems  Constitutional: Negative for diaphoresis.  Respiratory: Positive for shortness of breath.   Cardiovascular: Positive for chest pain.  Gastrointestinal: Positive for nausea.  Neurological: Positive for headaches.  All other systems reviewed and are negative.    Physical Exam Updated Vital Signs BP 151/76   Pulse 78   Resp 20   Ht 6' (1.829 m)   Wt 223 lb (101.2 kg)   SpO2 100%   BMI 30.24 kg/m   Physical Exam  Constitutional: He is oriented to person, place, and time.  Appears uncomfortable  HENT:  Right Ear: External ear normal.  Left Ear: External ear normal.  Nose: Nose normal.  Mouth/Throat: Oropharynx is clear and moist. No oropharyngeal  exudate.  Eyes: Conjunctivae are normal.  Neck: Normal range of motion. Neck supple.  No bruits  Cardiovascular: Normal rate, regular rhythm, normal heart sounds and intact distal pulses.   Pulmonary/Chest:  Mildly increased WOB Soft end expiratory wheezes bilateral bases  Abdominal: Soft. Bowel sounds are normal. He exhibits no distension. There is no tenderness. There is no rebound and no guarding.  Musculoskeletal: He exhibits no edema.  No LE edema No calf tenderness  Lymphadenopathy:    He has no cervical adenopathy.  Neurological: He is alert and oriented to person, place, and time. No cranial nerve deficit.  Skin: Skin is warm and dry.  Psychiatric: He has a normal mood and affect.  Nursing note and vitals reviewed.  Vitals:   07/21/16 1649 07/21/16 1655 07/21/16 1738 07/21/16 1800  BP: 119/62 110/96 121/63 103/56  Pulse:   62 64  Resp:   20 21  SpO2:   98% 98%  Weight:      Height:         ED Treatments / Results  DIAGNOSTIC STUDIES: Oxygen Saturation is 100% on RA, normal by my interpretation.    COORDINATION OF CARE: 4:30 PM- Pt advised of plan for treatment and pt agrees.  Labs (all labs ordered are listed, but only abnormal results are displayed) Labs Reviewed  CBC - Abnormal; Notable for the following:       Result Value   RBC  4.15 (*)    HCT 38.5 (*)    All other components within normal limits  BASIC METABOLIC PANEL - Abnormal; Notable for the following:    Glucose, Bld 102 (*)    Calcium 8.5 (*)    All other components within normal limits  TROPONIN I  D-DIMER, QUANTITATIVE (NOT AT [facility])    EKG  EKG Interpretation  Date/Time:  Saturday July 21 2016 16:26:10 EDT Ventricular Rate:  74 PR Interval:    QRS Duration: 102 QT Interval:  387 QTC Calculation: 430 R Axis:   63 Text Interpretation:  Sinus rhythm RSR' in V1 or V2, right VCD or RVH leads reversed and now  No significant change since last tracing Confirmed by [facility]  MD, Loree Fee  [phone]) on 07/21/2016 4:32:45 PM       Radiology Dg Chest 2 View  Result Date: 07/21/2016 CLINICAL DATA:  Severe LEFT-sided chest pain began 30 minutes ago. EXAM: CHEST  2 VIEW COMPARISON:  06/25/2016. FINDINGS: Low lung volumes. Cardiomegaly. Mild vascular congestion. No focal infiltrates or failure. No effusion or pneumothorax. Bones unremarkable. Mild chronic biapical pleural thickening. IMPRESSION: Low lung volumes, without focal infiltrates or CHF. Electronically Signed   By: Staci Righter M.D.   On: 07/21/2016 17:36    Procedures Procedures (including critical care time)  Medications Ordered in ED Medications  nitroGLYCERIN (NITROSTAT) SL tablet 0.4 mg (0.4 mg Sublingual Given 07/21/16 1652)  ondansetron (ZOFRAN) injection 4 mg (4 mg Intravenous Given 07/21/16 1740)  morphine 4 MG/ML injection 4 mg (4 mg Intravenous Given 07/21/16 1747)  sodium chloride 0.9 % bolus 1,000 mL (0 mLs Intravenous Stopped 07/21/16 1811)     Initial Impression / Assessment and Plan / ED Course  I have reviewed the triage vital signs and the nursing notes.  Pertinent labs & imaging results that were available during my care of the patient were reviewed by me and considered in my medical decision making (see chart for details).  Clinical Course    5:31 PM Pain improved but not resolved after 3 x SL nitro 0.4mg . Pt describes persistent "pressure." Will re-check BP and if pt can tolerate will give dose of morphine. He reports anaphylaxis to ASA and IV contrast dye. Dr. Maryan Rued now at bedside to evaluate pt as well. HEART score 5.  6:20 PM Pressure persists after morphine. Pt also had an 8-beat run of V-tach captured on monitor. His workup this far negative with nonacute EKG, Troponin 0, d-dimer negative, labs at baseline. However, with pt's risk factors, somewhat concerning and not clear history with his run of VT in the ED, we will call cardiology for possible admission.  6:35 PM I spoke to Dr. Lamona Curl,  cardiology fellow, who does agree pt would benefit from obs admission for chest pain workup. Will transfer to [facility] to the cardiologist service under attending Dr. Wynonia Lawman. Appreciate assistance.  Final Clinical Impressions(s) / ED Diagnoses   Final diagnoses:  Chest pain, unspecified type  Shortness of breath    New Prescriptions New Prescriptions   No medications on file   I personally performed the services described in this documentation, which was scribed in my presence. The recorded information has been reviewed and is accurate.    Anne Ng, PA-C 07/21/16 1836    Blanchie Dessert, MD 07/21/16 [phone]

## 2016-07-22 ENCOUNTER — Encounter (HOSPITAL_COMMUNITY): Payer: Self-pay

## 2016-07-22 DIAGNOSIS — R0789 Other chest pain: Secondary | ICD-10-CM | POA: Diagnosis not present

## 2016-07-22 DIAGNOSIS — R0602 Shortness of breath: Secondary | ICD-10-CM | POA: Diagnosis not present

## 2016-07-22 LAB — CBC
HCT: 36.8 % — ABNORMAL LOW (ref 39.0–52.0)
Hemoglobin: 11.9 g/dL — ABNORMAL LOW (ref 13.0–17.0)
MCH: 30.7 pg (ref 26.0–34.0)
MCHC: 32.3 g/dL (ref 30.0–36.0)
MCV: 94.8 fL (ref 78.0–100.0)
Platelets: 190 10*3/uL (ref 150–400)
RBC: 3.88 MIL/uL — AB (ref 4.22–5.81)
RDW: 12.7 % (ref 11.5–15.5)
WBC: 5.2 10*3/uL (ref 4.0–10.5)

## 2016-07-22 LAB — COMPREHENSIVE METABOLIC PANEL
ALBUMIN: 3.1 g/dL — AB (ref 3.5–5.0)
ALK PHOS: 54 U/L (ref 38–126)
ALT: 22 U/L (ref 17–63)
ANION GAP: 8 (ref 5–15)
AST: 18 U/L (ref 15–41)
BUN: 10 mg/dL (ref 6–20)
CALCIUM: 8.4 mg/dL — AB (ref 8.9–10.3)
CO2: 24 mmol/L (ref 22–32)
CREATININE: 1.04 mg/dL (ref 0.61–1.24)
Chloride: 108 mmol/L (ref 101–111)
GFR calc Af Amer: >60 mL/min (ref >60)
GFR calc non Af Amer: >60 mL/min (ref >60)
GLUCOSE: 96 mg/dL (ref 65–99)
Potassium: 4.4 mmol/L (ref 3.5–5.1)
SODIUM: 140 mmol/L (ref 135–145)
Total Bilirubin: 0.5 mg/dL (ref 0.3–1.2)
Total Protein: 5.4 g/dL — ABNORMAL LOW (ref 6.5–8.1)

## 2016-07-22 LAB — CBC WITH DIFFERENTIAL/PLATELET
BASOS ABS: 0 10*3/uL (ref 0.0–0.1)
Basophils Relative: 1 %
Eosinophils Absolute: 0.4 10*3/uL (ref 0.0–0.7)
Eosinophils Relative: 7 %
HEMATOCRIT: 36.6 % — AB (ref 39.0–52.0)
Hemoglobin: 12 g/dL — ABNORMAL LOW (ref 13.0–17.0)
LYMPHS PCT: 28 %
Lymphs Abs: 1.4 10*3/uL (ref 0.7–4.0)
MCH: 30.6 pg (ref 26.0–34.0)
MCHC: 32.8 g/dL (ref 30.0–36.0)
MCV: 93.4 fL (ref 78.0–100.0)
MONO ABS: 0.3 10*3/uL (ref 0.1–1.0)
Monocytes Relative: 6 %
NEUTROS ABS: 3 10*3/uL (ref 1.7–7.7)
Neutrophils Relative %: 58 %
Platelets: 187 10*3/uL (ref 150–400)
RBC: 3.92 MIL/uL — AB (ref 4.22–5.81)
RDW: 12.8 % (ref 11.5–15.5)
WBC: 5.1 10*3/uL (ref 4.0–10.5)

## 2016-07-22 LAB — TROPONIN I
Troponin I: <0.03 ng/mL (ref <0.03)
Troponin I: <0.03 ng/mL (ref <0.03)
Troponin I: <0.03 ng/mL (ref <0.03)

## 2016-07-22 LAB — MAGNESIUM: Magnesium: 2.2 mg/dL (ref 1.7–2.4)

## 2016-07-22 LAB — GLUCOSE, CAPILLARY: GLUCOSE-CAPILLARY: 109 mg/dL — AB (ref 65–99)

## 2016-07-22 MED ORDER — FLEET ENEMA 7-19 GM/118ML RE ENEM
1.0000 | ENEMA | Freq: Once | RECTAL | Status: AC
  Administered 2016-07-22: 1 via RECTAL
  Filled 2016-07-22: qty 1

## 2016-07-22 MED ORDER — METFORMIN HCL ER 500 MG PO TB24: 500.0000 mg | ORAL_TABLET | Freq: Every day | ORAL | Status: DC

## 2016-07-22 NOTE — Progress Notes (Signed)
Pt states he does not and will not take Glucophage due to side affects. Cecilie Kicks NP notified. Glucophage discontinued. Carroll Kinds RN

## 2016-07-22 NOTE — Progress Notes (Signed)
[facility]33. Patient alert, oriented, verbal. Patient's vital signs are stable, however patient does state that he is having 6/10 chest "pressure". Dr. Lamona Curl paged and notified of patient arrival and current status. Order for stat EKG obtained. Will cont. To monitor

## 2016-07-22 NOTE — Progress Notes (Signed)
Subjective:  He was admitted yesterday after some dizziness and low blood pressure.  Reports taking his blood pressure medicines and also had a fairly sharp chest pain and developed dizziness when he coughed.  Was given IV fluids overnight.  Somewhat difficult history.  His troponins are all negative.  Objective:  Vital Signs in the last 24 hours: BP 121/63 (BP Location: Right Arm)   Pulse (!) 56   Temp 97.8 F (36.6 C) (Oral)   Resp 15   Ht 6' (1.829 m)   Wt 99.8 kg (220 lb 0.3 oz)   SpO2 97%   BMI 29.84 kg/m   Physical Exam: Anxious middle-aged male in no acute distress  Lungs:  Clear Cardiac:  Regular rhythm, normal S1 and S2, no S3 Abdomen:  Soft, nontender, no masses Extremities:  No edema present  Intake/Output from previous day: 10/07 0701 - 10/08 0700 In: 1240 [P.O.:240; I.V.:1000] Out: 1085 [Urine:1085]  Weight Filed Weights   07/21/16 1622 07/21/16 2120 07/22/16 0425  Weight: 101.2 kg (223 lb) 100.3 kg (221 lb 3.2 oz) 99.8 kg (220 lb 0.3 oz)    Lab Results: Basic Metabolic Panel:  Recent Labs  07/21/16 1715 07/22/16 0533  NA 136 140  K 4.0 4.4  CL 106 108  CO2 23 24  GLUCOSE 102* 96  BUN 17 10  CREATININE 1.16 1.04   CBC:  Recent Labs  07/21/16 1645 07/22/16 0533  WBC 6.8 5.2  HGB 13.1 11.9*  HCT 38.5* 36.8*  MCV 92.8 94.8  PLT 225 190   Cardiac Panel (last 3 results)  Recent Labs  07/21/16 1925 07/22/16 0010 07/22/16 0533  TROPONINI <0.03 <0.03 <0.03    Telemetry: Sinus rhythm  Assessment/Plan:  1.  Dizziness and somewhat low blood pressure that may be due to overmedication with his antihypertensive medicines 2.  Atypical chest pain 3.  Hypertension previously treated 4.  COPD 5.  Schizophrenia and anxiety  Recommendations:  We'll back off on IV fluids.  Check orthostatic blood pressures.  Ambulate.  We'll reassess in the morning to figure out if he needs to have in her outpatient stress testing at this point.     Kerry Hough  MD [facility] Cardiology  07/22/2016, 9:56 AM

## 2016-07-23 ENCOUNTER — Other Ambulatory Visit: Payer: Self-pay | Admitting: Cardiology

## 2016-07-23 ENCOUNTER — Telehealth: Payer: Self-pay

## 2016-07-23 DIAGNOSIS — R42 Dizziness and giddiness: Secondary | ICD-10-CM

## 2016-07-23 DIAGNOSIS — R079 Chest pain, unspecified: Secondary | ICD-10-CM

## 2016-07-23 DIAGNOSIS — I10 Essential (primary) hypertension: Secondary | ICD-10-CM

## 2016-07-23 DIAGNOSIS — R0789 Other chest pain: Secondary | ICD-10-CM | POA: Diagnosis not present

## 2016-07-23 DIAGNOSIS — R0602 Shortness of breath: Secondary | ICD-10-CM | POA: Diagnosis not present

## 2016-07-23 DIAGNOSIS — I499 Cardiac arrhythmia, unspecified: Secondary | ICD-10-CM

## 2016-07-23 LAB — BASIC METABOLIC PANEL
Anion gap: 6 (ref 5–15)
BUN: 13 mg/dL (ref 6–20)
CALCIUM: 8.8 mg/dL — AB (ref 8.9–10.3)
CO2: 28 mmol/L (ref 22–32)
CREATININE: 1.09 mg/dL (ref 0.61–1.24)
Chloride: 106 mmol/L (ref 101–111)
GFR calc Af Amer: >60 mL/min (ref >60)
GLUCOSE: 136 mg/dL — AB (ref 65–99)
Potassium: 4.3 mmol/L (ref 3.5–5.1)
Sodium: 140 mmol/L (ref 135–145)

## 2016-07-23 LAB — GLUCOSE, CAPILLARY
Glucose-Capillary: 116 mg/dL — ABNORMAL HIGH (ref 65–99)
Glucose-Capillary: 107 mg/dL — ABNORMAL HIGH (ref 65–99)

## 2016-07-23 MED ORDER — METOPROLOL SUCCINATE ER 25 MG PO TB24: 25.0000 mg | ORAL_TABLET | Freq: Every day | ORAL | Status: DC

## 2016-07-23 NOTE — Care Management Obs Status (Signed)
[place] NOTIFICATION   Patient Details  Name: [name] MRN: [id number] Date of Birth: 1951-06-11   Medicare Observation Status Notification Given:  Yes    Bethena Roys, RN 07/23/2016, 2:24 PM

## 2016-07-23 NOTE — Telephone Encounter (Signed)
It would be fine with me to hold off on the metformin if you want.

## 2016-07-23 NOTE — Discharge Summary (Signed)
Discharge Summary    Patient ID: [name],  MRN: [id number], DOB/AGE: 66-Jul-1952 66 y.o.  Admit date: 07/21/2016 Discharge date: 07/23/2016  Primary Care Provider: [name] Primary Cardiologist: Dr. Stanford Breed  Discharge Diagnoses    Principal Problem:   non-specific Chest pain Active Problems:   Hypertension   Gastroesophageal reflux disease without esophagitis   Diabetes mellitus without complication- (diet controlled; w/o proteinuria)   Shortness of breath   Allergies Allergies  Allergen Reactions  . Aspirin Anaphylaxis  . Hornet Venom Anaphylaxis  . Ivp Dye [Iodinated Diagnostic Agents] Anaphylaxis  . Levaquin [Levofloxacin In D5w] Shortness Of Breath and Swelling    In addition: sweating, chest pain, and diarrhea.   . Nsaids Anaphylaxis  . Penicillins Anaphylaxis    Heart stops Has patient had a PCN reaction causing immediate rash, facial/tongue/throat swelling, SOB or lightheadedness with hypotension: yes Has patient had a PCN reaction causing severe rash involving mucus membranes or skin necrosis: yes Has patient had a PCN reaction that required hospitalization yes Has patient had a PCN reaction occurring within the last 10 years: No If all of the above answers are "NO", then may proceed with Cephalosporin use.   . Tolmetin Anaphylaxis  . Buprenorphine Hcl Nausea And Vomiting    Can take with zofran   . Morphine And Related Nausea And Vomiting    Can take with zofran   . Oxycodone Itching and Rash    Diagnostic Studies/Procedures    None _____________   History of Present Illness     65 year old male with history of atypical angina, COPD on 2L O2 at night, HTN, DM (recently prescribed metformin), HLD, anxiety with acute onset of lightheadedness and dizziness with subsequent chest pain.  [place] started feeling pre-syncopal and had associated chest pain, worse with cough.  Earlier in the day,  noticed dry mouth and "couldn't even spit" he had soo little saliva.  Drank a can of beer at lunch.  Progressively felt all of these symptoms worsen throughout the day, and took his BP which was low prompting presentation to [facility] ED.  Over the past few weeks, he denies other chest pain, SOB above usual, edema, orthopnea, PND, bleeding/bruising or other illness.  At the OSH, he reportedly had a period of symptomatic NSVT.  ECG was without significant ischemic changes.  He was given NTG x3 and this did not help his angina.  He also got zofran, morphine, 1L NS.  He notes that gradually some chest pressure he had eased off over time prior to arrival at [facility].  No recurrence but did have some mild chest discomfort while eating here at [facility] which was brief.  On interview, mouth still dry-- BP 82/64, p 60.  Did not receive ASA given report of allergy.  D-dimer negative at OSH.  Given run of NSVT, Dr. Chesley Noon Sam requesting cardiology admission.    Recently was admitted 1 month ago for chest pain and was ruled out with negative troponin and discharged after TTE wnl.  Dr. Stanford Breed was planning for exercise treadmill test planned for Tuesday.   Hospital Course     Consultants: None   He was admitted to [facility] with his home blood pressure medications held on admission. Was also started on IV fluids for possible dehydration. Trops were cycled and neg x3. Electrolytes checked and normal potassium and magnesium. His dizziness improved the following day. He was able to ambulate. Orthostatic  vital signs were negative. He denied any recurrent dizziness with position changes.   He was observed on telemetry and noted to have recurrence of possible SVT with aberrancy or possible a-fib RVR. Blood pressure did improve and he was started back on his home Toprol XL with other blood pressure medications held. Plan to arrange for outpatient Lexiscan instead of exercise treadmill given his continued reports of  dizziness. Also have arranged to have outpatient monitor placed to further assess abnormal rhythms noted on telemetry.   He was seen and assessed on 10/9 by Dr. Debara Pickett and determined stable for discharge home. Follow up testing has been arranged in the outpatient setting.  _____________  Discharge Vitals Blood pressure (!) 103/46, pulse (!) 59, temperature 98.4 F (36.9 C), temperature source Oral, resp. rate 18, height 6' (1.829 m), weight 221 lb 14.4 oz (100.7 kg), SpO2 99 %.  Filed Weights   07/21/16 2120 07/22/16 0425 07/23/16 0625  Weight: 221 lb 3.2 oz (100.3 kg) 220 lb 0.3 oz (99.8 kg) 221 lb 14.4 oz (100.7 kg)    Labs & Radiologic Studies    CBC  Recent Labs  07/22/16 0533 07/22/16 1124  WBC 5.2 5.1  NEUTROABS  --  3.0  HGB 11.9* 12.0*  HCT 36.8* 36.6*  MCV 94.8 93.4  PLT 190 123XX123   Basic Metabolic Panel  Recent Labs  07/22/16 0533 07/23/16 0335  NA 140 140  K 4.4 4.3  CL 108 106  CO2 24 28  GLUCOSE 96 136*  BUN 10 13  CREATININE 1.04 1.09  CALCIUM 8.4* 8.8*  MG 2.2  --    Liver Function Tests  Recent Labs  07/22/16 0533  AST 18  ALT 22  ALKPHOS 54  BILITOT 0.5  PROT 5.4*  ALBUMIN 3.1*   No results for input(s): LIPASE, AMYLASE in the last 72 hours. Cardiac Enzymes  Recent Labs  07/22/16 0010 07/22/16 0533 07/22/16 1124  TROPONINI <0.03 <0.03 <0.03   BNP Invalid input(s): POCBNP D-Dimer  Recent Labs  07/21/16 1715  DDIMER 0.28   Hemoglobin A1C No results for input(s): HGBA1C in the last 72 hours. Fasting Lipid Panel No results for input(s): CHOL, HDL, LDLCALC, TRIG, CHOLHDL, LDLDIRECT in the last 72 hours. Thyroid Function Tests No results for input(s): TSH, T4TOTAL, T3FREE, THYROIDAB in the last 72 hours.  Invalid input(s): FREET3 _____________  Dg Chest 2 View  Result Date: 07/21/2016 CLINICAL DATA:  Severe LEFT-sided chest pain began 30 minutes ago. EXAM: CHEST  2 VIEW COMPARISON:  06/25/2016. FINDINGS: Low lung volumes.  Cardiomegaly. Mild vascular congestion. No focal infiltrates or failure. No effusion or pneumothorax. Bones unremarkable. Mild chronic biapical pleural thickening. IMPRESSION: Low lung volumes, without focal infiltrates or CHF. Electronically Signed   By: Staci Righter M.D.   On: 07/21/2016 17:36   Disposition   Pt is being discharged home today in good condition.  Follow-up Plans & Appointments    Follow-up Information    [facility] Follow up on 08/02/2016.   Specialty:  Cardiology Why:  at 7:30am for your stress test. Please no food or drink after midnight the night before. Meds are ok to take.  Contact information: [place] [facility] Follow up on 08/02/2016.   Why:  at 12pm to have your heart monitor placed.  Contact information: [place] [phone]  Discharge Instructions    Diet - low sodium heart healthy    Complete by:  As directed    Discharge instructions    Complete by:  As directed    Please stop taking your Norvasc and losartan, but continue to take your metoprolol. Monitor your blood pressure at home and please bring to your follow up office visit. You have been scheduled for a stress test next week and to have an event monitor placed afterwards. Please call with any questions.   Increase activity slowly    Complete by:  As directed       Discharge Medications   Current Discharge Medication List    CONTINUE these medications which have NOT CHANGED   Details  acetaminophen (TYLENOL) 500 MG tablet Take 500 mg by mouth every 6 (six) hours as needed for headache (pain).    albuterol (PROVENTIL HFA;VENTOLIN HFA) 108 (90 Base) MCG/ACT inhaler Inhale 2 puffs into the lungs every 6 (six) hours as needed for wheezing or shortness of breath. Qty: 3 Inhaler, Refills: 3      ALPRAZolam (XANAX) 1 MG tablet Take 0.5 tablets (0.5 mg total) by mouth 3 (three) times daily as needed for anxiety. Qty: 90 tablet, Refills: 0   Associated Diagnoses: Anxiety    atorvastatin (LIPITOR) 40 MG tablet One tab q hs Qty: 90 tablet, Refills: 1    baclofen (LIORESAL) 10 MG tablet Take 1 tablet (10 mg total) by mouth 2 (two) times daily. Qty: 60 each, Refills: 0    dexlansoprazole (DEXILANT) 60 MG capsule Take 1 capsule (60 mg total) by mouth daily. Qty: 90 capsule, Refills: 3    EPINEPHrine 0.3 mg/0.3 mL IJ SOAJ injection Inject 0.3 mLs (0.3 mg total) into the muscle once. Qty: 1 Device, Refills: 1    fluticasone (FLONASE) 50 MCG/ACT nasal spray Place 2 sprays into both nostrils daily. Qty: 16 g, Refills: 6    fluticasone furoate-vilanterol (BREO ELLIPTA) 100-25 MCG/INH AEPB INHALE 1 PUFF BY MOUTH EVERY MORNING Qty: 180 each, Refills: 3    HYDROcodone-acetaminophen (NORCO/VICODIN) 5-325 MG tablet Take 1 tablet by mouth every 6 (six) hours as needed for moderate pain.    ipratropium-albuterol (DUONEB) 0.5-2.5 (3) MG/3ML SOLN Take 3 mLs by nebulization every 4 (four) hours as needed (shortness of breath and wheezing).    meclizine (ANTIVERT) 25 MG tablet Take 25 mg by mouth 3 (three) times daily as needed for dizziness.    metoprolol succinate (TOPROL-XL) 25 MG 24 hr tablet Take 1 tablet (25 mg total) by mouth daily. Qty: 30 tablet, Refills: 0    montelukast (SINGULAIR) 10 MG tablet TAKE 1 TABLET (10 MG TOTAL) BY MOUTH AT BEDTIME. Qty: 90 tablet, Refills: 1    nitroGLYCERIN (NITROSTAT) 0.4 MG SL tablet Place 1 tablet (0.4 mg total) under the tongue every 5 (five) minutes as needed for chest pain. Qty: 30 tablet, Refills: 3    ondansetron (ZOFRAN) 8 MG tablet Take 8 mg by mouth daily as needed for nausea or vomiting.     OXYGEN Inhale 2 L into the lungs at bedtime. Reported on 11/25/2015    oxymetazoline (AFRIN) 0.05 % nasal spray Place 1 spray into both nostrils  continuous.     Blood Glucose Monitoring Suppl (ACCU-CHEK AVIVA) device Use to check blood sugars every morning fasting, 2 hours after largest meal and as needed with low blood sugar symptoms Qty: 1 each, Refills: 0   Associated Diagnoses: Diabetes mellitus without complication (HCC)    glucose  blood (ACCU-CHEK AVIVA) test strip Check blood sugars every morning fasting, 2 hours after largest meal and as needed with low blood sugar symptoms Qty: 100 each, Refills: 12   Associated Diagnoses: Diabetes mellitus without complication (HCC)    Lancet Devices (ACCU-CHEK SOFTCLIX) lancets Check blood sugars every morning fasting, 2 hours after largest meal and as needed with low blood sugar symptoms Qty: 100 each, Refills: 12   Associated Diagnoses: Diabetes mellitus without complication (HCC)    metFORMIN (GLUCOPHAGE-XR) 500 MG 24 hr tablet Take 1 tablet (500 mg total) by mouth daily with breakfast. Qty: 90 tablet, Refills: 3   Associated Diagnoses: Diabetes mellitus without complication ([place])      STOP taking these medications     amLODipine (NORVASC) 10 MG tablet      losartan (COZAAR) 50 MG tablet           Outstanding Labs/Studies   Scheduled for cardiac monitor, and lexiscan myoview as outpatient.   Duration of Discharge Encounter   Greater than 30 minutes including physician time.  Signed, Reino Bellis NP-C 07/23/2016, 2:21 PM

## 2016-07-23 NOTE — Telephone Encounter (Signed)
[facility] desk notified of MD decision to stop metformin.

## 2016-07-23 NOTE — Progress Notes (Signed)
Patient Name: [name] Date of Encounter: 07/23/2016  Hospital Problem List     Principal Problem:   non-specific Chest pain Active Problems:   Hypertension   Gastroesophageal reflux disease without esophagitis   Diabetes mellitus without complication- (diet controlled; w/o proteinuria)    Subjective   Feeling well this morning, reports he ambulated yesterday but did feel "woozy"   Inpatient Medications    . atorvastatin  40 mg Oral q1800  . fluticasone  2 spray Each Nare Daily  . fluticasone furoate-vilanterol  1 puff Inhalation Daily  . pantoprazole  40 mg Oral Daily  . sodium chloride flush  3 mL Intravenous Q12H    Vital Signs    Vitals:   07/22/16 1650 07/22/16 2143 07/23/16 0625 07/23/16 0802  BP: 118/62 (!) 119/53 (!) 110/40   Pulse: (!) 59 64 60   Resp: (!) 21 18 18    Temp: 98 F (36.7 C) 98.3 F (36.8 C) 97.5 F (36.4 C)   TempSrc: Oral Oral Oral   SpO2: 100% 100% 97% 98%  Weight:   221 lb 14.4 oz (100.7 kg)   Height:        Intake/Output Summary (Last 24 hours) at 07/23/16 0945 Last data filed at 07/23/16 0933  Gross per 24 hour  Intake              480 ml  Output             2120 ml  Net            -1640 ml   Filed Weights   07/21/16 2120 07/22/16 0425 07/23/16 0625  Weight: 221 lb 3.2 oz (100.3 kg) 220 lb 0.3 oz (99.8 kg) 221 lb 14.4 oz (100.7 kg)    Physical Exam    General: Pleasant older caucasian male, NAD. Neuro: Alert and oriented X 3. Moves all extremities spontaneously. Psych: Normal affect. HEENT:  Normal  Neck: Supple without bruits or JVD. Lungs:  Resp regular and unlabored, CTA. Heart: RRR no s3, s4, or murmurs. Abdomen: Soft, non-tender, non-distended, BS + x 4.  Extremities: No clubbing, cyanosis or edema. DP/PT/Radials 2+ and equal bilaterally.  Labs    CBC  Recent Labs  07/22/16 0533 07/22/16 1124  WBC 5.2 5.1  NEUTROABS  --  3.0  HGB 11.9* 12.0*  HCT 36.8* 36.6*  MCV 94.8 93.4  PLT 190 123XX123    Basic Metabolic Panel  Recent Labs  07/22/16 0533 07/23/16 0335  NA 140 140  K 4.4 4.3  CL 108 106  CO2 24 28  GLUCOSE 96 136*  BUN 10 13  CREATININE 1.04 1.09  CALCIUM 8.4* 8.8*  MG 2.2  --    Liver Function Tests  Recent Labs  07/22/16 0533  AST 18  ALT 22  ALKPHOS 54  BILITOT 0.5  PROT 5.4*  ALBUMIN 3.1*   No results for input(s): LIPASE, AMYLASE in the last 72 hours. Cardiac Enzymes  Recent Labs  07/22/16 0010 07/22/16 0533 07/22/16 1124  TROPONINI <0.03 <0.03 <0.03   BNP Invalid input(s): POCBNP D-Dimer  Recent Labs  07/21/16 1715  DDIMER 0.28   Hemoglobin A1C No results for input(s): HGBA1C in the last 72 hours. Fasting Lipid Panel No results for input(s): CHOL, HDL, LDLCALC, TRIG, CHOLHDL, LDLDIRECT in the last 72 hours. Thyroid Function Tests No results for input(s): TSH, T4TOTAL, T3FREE, THYROIDAB in the last 72 hours.  Invalid input(s): FREET3  Telemetry    SB with 22  beat run of VT. Asymptomatic   ECG    N/A  Radiology     Assessment & Plan    65 year old male with history of atypical angina, COPD on 2L O2 at night, HTN, DM (recently prescribed metformin), HLD, anxiety with acute onset of lightheadedness and dizziness with subsequent chest pain.  1. Dizziness/Presyncope: Blood pressure was low initially. Treated with IV fluids and all bp meds held on admission. Symptoms have since improved. Orthostatics checked and ok yesterday. States he did ambulate yesterday, and felt slightly "woozy".   2. Atypical chest pain: Trop cycled neg x3. No further reports of chest pain this admission. Plan for Exercise treadmill, planned for Tuesday as outpatient.   3. NSVT: Reportedly had a run of NSVT at [facility], telemetry reviewed this morning and had a 22 beat run of VT, asymptomatic by patient report. Echo 9/17 showed normal EF with no WMA. Was originally on metoprolol 25mg  daily, this was held this admission. May need to add back low  dose, but continue to hold other bp meds given his hypotension this admission. May benefit from outpatient monitor. -- Potassium and Mag ok.    Signed, Reino Bellis NP-C Pager [phone]

## 2016-07-23 NOTE — Telephone Encounter (Signed)
Pt is aware of the information per the MD

## 2016-07-23 NOTE — Telephone Encounter (Signed)
Called patient advised of lab results. Patient states that he is worried to take the metformin because he has heard it makes people very sick. I advised that every person is different and we would not know how it effected him until he tried it. Patient also states he is currently at [facility] right now in the hospital due to having a dizzy spell, and passing out. Patient is seeing a cardiologist over there and they are running test because he is having low blood pressures. Patient asking if this will effect anything if he begins to take the metformin once he is at home. Please advise. Thank you!

## 2016-07-24 ENCOUNTER — Inpatient Hospital Stay (HOSPITAL_COMMUNITY): Admission: RE | Admit: 2016-07-24 | Payer: Self-pay | Source: Ambulatory Visit

## 2016-07-25 ENCOUNTER — Telehealth: Payer: Self-pay | Admitting: Family Medicine

## 2016-07-25 ENCOUNTER — Ambulatory Visit: Payer: Self-pay

## 2016-07-25 NOTE — Telephone Encounter (Signed)
Please call patient and speak with him regarding his dissatisfaction.

## 2016-07-25 NOTE — Telephone Encounter (Signed)
Patient went to ED last week because of a dizzy spell, doctor at hospital told patient that certain drug interaction Dr. Jenetta Downer prescribed "were not good together" (patient couldn't provide the names of the drugs) and that he was possibly looking at legal recourse, but has definitely stated he will not be coming back to our clinic. No one clinical available to field call.

## 2016-07-26 NOTE — Telephone Encounter (Signed)
I called and had to leave a message on [name] cell phone @ 9:58am.

## 2016-07-31 ENCOUNTER — Telehealth (HOSPITAL_COMMUNITY): Payer: Self-pay

## 2016-07-31 NOTE — Telephone Encounter (Signed)
Encounter complete. 

## 2016-08-01 ENCOUNTER — Telehealth: Payer: Self-pay | Admitting: Cardiology

## 2016-08-01 DIAGNOSIS — H2513 Age-related nuclear cataract, bilateral: Secondary | ICD-10-CM | POA: Diagnosis not present

## 2016-08-01 DIAGNOSIS — E113293 Type 2 diabetes mellitus with mild nonproliferative diabetic retinopathy without macular edema, bilateral: Secondary | ICD-10-CM | POA: Diagnosis not present

## 2016-08-01 LAB — HM DIABETES EYE EXAM

## 2016-08-01 NOTE — Telephone Encounter (Signed)
Left msg to call.

## 2016-08-01 NOTE — Telephone Encounter (Signed)
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 He crawls on his stomach and climbs up ladders more of the day. He is wondering if he can take off the monitor while working and put it back on when he is off.Patient would like a callback today before comes to his appointment tomorrow. Thanks

## 2016-08-02 ENCOUNTER — Ambulatory Visit (HOSPITAL_COMMUNITY)
Admission: RE | Admit: 2016-08-02 | Discharge: 2016-08-02 | Disposition: A | Discharge status: Home / Self Care | Payer: Medicare Other | Source: Ambulatory Visit | Attending: Cardiology | Admitting: Cardiology

## 2016-08-02 ENCOUNTER — Encounter (HOSPITAL_COMMUNITY): Payer: Self-pay | Admitting: *Deleted

## 2016-08-02 ENCOUNTER — Ambulatory Visit: Payer: Self-pay

## 2016-08-02 DIAGNOSIS — Z87891 Personal history of nicotine dependence: Secondary | ICD-10-CM | POA: Diagnosis not present

## 2016-08-02 DIAGNOSIS — R002 Palpitations: Secondary | ICD-10-CM | POA: Diagnosis not present

## 2016-08-02 DIAGNOSIS — I10 Essential (primary) hypertension: Secondary | ICD-10-CM | POA: Insufficient documentation

## 2016-08-02 DIAGNOSIS — R0609 Other forms of dyspnea: Secondary | ICD-10-CM | POA: Insufficient documentation

## 2016-08-02 DIAGNOSIS — G4733 Obstructive sleep apnea (adult) (pediatric): Secondary | ICD-10-CM | POA: Diagnosis not present

## 2016-08-02 DIAGNOSIS — R079 Chest pain, unspecified: Secondary | ICD-10-CM

## 2016-08-02 DIAGNOSIS — E663 Overweight: Secondary | ICD-10-CM | POA: Insufficient documentation

## 2016-08-02 DIAGNOSIS — Z6829 Body mass index (BMI) 29.0-29.9, adult: Secondary | ICD-10-CM | POA: Diagnosis not present

## 2016-08-02 DIAGNOSIS — R5383 Other fatigue: Secondary | ICD-10-CM | POA: Insufficient documentation

## 2016-08-02 DIAGNOSIS — E119 Type 2 diabetes mellitus without complications: Secondary | ICD-10-CM | POA: Diagnosis not present

## 2016-08-02 LAB — MYOCARDIAL PERFUSION IMAGING
CHL CUP NUCLEAR SSS: 2
CHL CUP RESTING HR STRESS: 62 {beats}/min
LV dias vol: 104 mL (ref 62–150)
LV sys vol: 41 mL
Peak HR: 105 {beats}/min
SRS: 2
TID: 1.19

## 2016-08-02 MED ORDER — TECHNETIUM TC 99M TETROFOSMIN IV KIT
10.7000 | PACK | Freq: Once | INTRAVENOUS | Status: AC | PRN
  Administered 2016-08-02: 10.7 via INTRAVENOUS
  Filled 2016-08-02: qty 11

## 2016-08-02 MED ORDER — REGADENOSON 0.4 MG/5ML IV SOLN
0.4000 mg | Freq: Once | INTRAVENOUS | Status: AC
  Administered 2016-08-02: 0.4 mg via INTRAVENOUS

## 2016-08-02 MED ORDER — AMINOPHYLLINE 25 MG/ML IV SOLN
150.0000 mg | Freq: Once | INTRAVENOUS | Status: AC
  Administered 2016-08-02: 150 mg via INTRAVENOUS

## 2016-08-02 MED ORDER — TECHNETIUM TC 99M TETROFOSMIN IV KIT
31.9000 | PACK | Freq: Once | INTRAVENOUS | Status: AC | PRN
  Administered 2016-08-02: 31.9 via INTRAVENOUS
  Filled 2016-08-02: qty 32

## 2016-08-02 NOTE — Telephone Encounter (Signed)
Patient seen for his scheduled testing today.

## 2016-08-02 NOTE — Progress Notes (Signed)
Pt developed CP prior to stress imaging. Pt stated it rated a 6 or 7 on 1-10 CP scale. Pt given one 0.4 mg sl NTG tablet at 9:45 am per Dr Claiborne Billings. CP still present and rating a 6 or 7 on CP scale. Pt given another 0.4 mg sl NTG tab at 10:05 am. Dr Claiborne Billings reviewed Myoview study and EKG's. Chest pain resolved. Ok to d/c pt home per Dr Claiborne Billings. Another 12 lead EKG repeated prior to pt being discharged.

## 2016-08-03 DIAGNOSIS — C61 Malignant neoplasm of prostate: Secondary | ICD-10-CM | POA: Diagnosis not present

## 2016-08-06 ENCOUNTER — Ambulatory Visit (INDEPENDENT_AMBULATORY_CARE_PROVIDER_SITE_OTHER): Payer: Medicare Other | Admitting: Family Medicine

## 2016-08-06 ENCOUNTER — Encounter: Payer: Self-pay | Admitting: Family Medicine

## 2016-08-06 VITALS — BP 138/80 | HR 68 | Temp 97.7°F | Resp 12 | Ht 72.0 in | Wt 222.2 lb

## 2016-08-06 DIAGNOSIS — F419 Anxiety disorder, unspecified: Secondary | ICD-10-CM | POA: Diagnosis not present

## 2016-08-06 DIAGNOSIS — R079 Chest pain, unspecified: Secondary | ICD-10-CM | POA: Diagnosis not present

## 2016-08-06 DIAGNOSIS — I10 Essential (primary) hypertension: Secondary | ICD-10-CM

## 2016-08-06 DIAGNOSIS — J449 Chronic obstructive pulmonary disease, unspecified: Secondary | ICD-10-CM

## 2016-08-06 DIAGNOSIS — K219 Gastro-esophageal reflux disease without esophagitis: Secondary | ICD-10-CM | POA: Diagnosis not present

## 2016-08-06 NOTE — Progress Notes (Signed)
Pre visit review using our clinic review tool, if applicable. No additional management support is needed unless otherwise documented below in the visit note. 

## 2016-08-06 NOTE — Progress Notes (Signed)
HPI:   [name] is a 65 y.o. male, who is here today to establish care with me.  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   He was recently hospitalized, since 07/20/2016 to 07/22/2016 because of pre syncopal episode. According to pt, he had stress test a week after discharge and had some complications during and after test, reporting "heart spasm" that required several doses of Nitroglycerin.  He is still having episodes of chest pain, points left chondrocostal area, intermittently, no radiated, not always related with exertion and no associated symptoms. He has not identified exacerbating or relieving factors. He is also describing episodes of "BP dropping", he has not checked BP but assumes this is waht it is causing symptoms, cannot described specific symptoms, no diaphoresis, +intense fatigue that lasts a few minutes.  He has history of GERD, he doesn't follow diet recommendations, skips meals frequently. He is currently on Dexilant 60 mg, which he takes every 2 days. + Heartburn and epigastric discomfort, exacerbated by skipping meals and with certain foods. Alleviated by food intake.   Denies abdominal pain, nausea, vomiting, changes in bowel habits, blood in stool or melena.  He is currently following with cardiologist, Dr. Stanford Breed, next appointment 08/13/16. According to patient he is going to wear an event monitor for about 30 days.  He follows with Dr. Loanne Drilling for DM II, states that Metformin was recently discontinued because "heart problems", has appt in 2 weeks.  -For prostate cancer he follows with urologists, currently following PSA.  -Hx of anxiety, he takes Xanax, has not had recent Rx and still taking tabs left from old Rx. He denies depression or suicidal thoughts.  According to patient, he was recommended to see a "specilists" to continue treatment, he has not established with psychiatrists. He has listed Hx of alcoholism, still drinks but states that not as much as he did, he drinks 6 pack beer weekly.  COPD: On Breo 100-25 mcg daily and Albuterol inh as needed. He also has nebulizer to use as needed, Duoneb Singulair 10 mg at bedtime.  He is on supplemental O2 at night as needed, 2 LPM. Former smoker.  Concerns today: Metoprolol refills.   Hypertension: Medications were changed after hospital discharged, he was on Amlodipine and Losartan. Currently he is on Metoprolol succinate 25 mg daily. Reporting no side effects from medication. He denies any frequent/severe headache, visual changes, dyspnea, palpitation, abdominal pain, nausea, vomiting, or edema.  Lab Results  Component Value Date   CREATININE 1.09 07/23/2016   BUN 13 07/23/2016   NA 140 07/23/2016   K 4.3 07/23/2016   CL 106 07/23/2016   CO2 28 07/23/2016   Echo 06/26/16 LVEF 55-65%.    Review of Systems  Constitutional: Positive for fatigue (no more than usual). Negative for activity change, appetite change, diaphoresis, fever and unexpected weight change.  HENT: Negative for mouth sores, nosebleeds, sore throat and trouble swallowing.   Eyes: Negative for pain, redness and visual disturbance.  Respiratory: Negative for cough, shortness of breath and wheezing.   Cardiovascular: Positive for chest pain. Negative for palpitations and leg swelling.  Gastrointestinal: Negative for abdominal pain, blood in stool, nausea and vomiting.       No changes in bowel habits.  Endocrine: Negative for polydipsia, polyphagia and polyuria.  Genitourinary: Negative for decreased  urine volume, dysuria and hematuria.  Musculoskeletal: Positive for arthralgias and back pain. Negative for gait problem and myalgias.  Skin: Negative for pallor and rash.  Allergic/Immunologic: Positive for  environmental allergies.  Neurological: Negative for syncope, weakness and headaches.  Psychiatric/Behavioral: Negative for confusion and hallucinations. The patient is nervous/anxious.       Current Outpatient Prescriptions on File Prior to Visit  Medication Sig Dispense Refill  . acetaminophen (TYLENOL) 500 MG tablet Take 500 mg by mouth every 6 (six) hours as needed for headache (pain).    Marland Kitchen albuterol (PROVENTIL HFA;VENTOLIN HFA) 108 (90 Base) MCG/ACT inhaler Inhale 2 puffs into the lungs every 6 (six) hours as needed for wheezing or shortness of breath. 3 Inhaler 3  . ALPRAZolam (XANAX) 1 MG tablet Take 0.5 tablets (0.5 mg total) by mouth 3 (three) times daily as needed for anxiety. (Patient taking differently: Take 1 mg by mouth 2 (two) times daily as needed for anxiety. ) 90 tablet 0  . atorvastatin (LIPITOR) 40 MG tablet One tab q hs (Patient taking differently: Take 40 mg by mouth at bedtime. ) 90 tablet 1  . baclofen (LIORESAL) 10 MG tablet Take 1 tablet (10 mg total) by mouth 2 (two) times daily. (Patient taking differently: Take 10 mg by mouth at bedtime as needed (leg cramps). ) 60 each 0  . Blood Glucose Monitoring Suppl (ACCU-CHEK AVIVA) device Use to check blood sugars every morning fasting, 2 hours after largest meal and as needed with low blood sugar symptoms 1 each 0  . dexlansoprazole (DEXILANT) 60 MG capsule Take 1 capsule (60 mg total) by mouth daily. (Patient taking differently: Take 60 mg by mouth every other day. ) 90 capsule 3  . EPINEPHrine 0.3 mg/0.3 mL IJ SOAJ injection Inject 0.3 mLs (0.3 mg total) into the muscle once. (Patient taking differently: Inject 0.3 mg into the muscle once as needed (severe allergic reactions). ) 1 Device 1  . fluticasone (FLONASE) 50 MCG/ACT nasal spray Place 2 sprays into both nostrils daily. (Patient taking differently: Place 2 sprays into both nostrils daily as needed for allergies or rhinitis (congestion). ) 16 g 6  . fluticasone  furoate-vilanterol (BREO ELLIPTA) 100-25 MCG/INH AEPB INHALE 1 PUFF BY MOUTH EVERY MORNING (Patient taking differently: Inhale 1 puff into the lungs daily. INHALE 1 PUFF BY MOUTH EVERY MORNIN) 180 each 3  . glucose blood (ACCU-CHEK AVIVA) test strip Check blood sugars every morning fasting, 2 hours after largest meal and as needed with low blood sugar symptoms 100 each 12  . HYDROcodone-acetaminophen (NORCO/VICODIN) 5-325 MG tablet Take 1 tablet by mouth every 6 (six) hours as needed for moderate pain.    Marland Kitchen ipratropium-albuterol (DUONEB) 0.5-2.5 (3) MG/3ML SOLN Take 3 mLs by nebulization every 4 (four) hours as needed (shortness of breath and wheezing).    Elmore Guise Devices (ACCU-CHEK SOFTCLIX) lancets Check blood sugars every morning fasting, 2 hours after largest meal and as needed with low blood sugar symptoms 100 each 12  . meclizine (ANTIVERT) 25 MG tablet Take 25 mg by mouth 3 (three) times daily as needed for dizziness.    . metFORMIN (GLUCOPHAGE-XR) 500 MG 24 hr tablet Take 1 tablet (500 mg total) by mouth daily with breakfast. 90 tablet 3  . metoprolol succinate (TOPROL-XL) 25 MG 24 hr tablet Take 1 tablet (25 mg total) by mouth daily. 30 tablet 0  . montelukast (SINGULAIR) 10 MG tablet TAKE 1 TABLET (10 MG TOTAL) BY MOUTH AT BEDTIME. (Patient taking  differently: Take 10 mg by mouth at bedtime as needed (seasonal allergies). ) 90 tablet 1  . nitroGLYCERIN (NITROSTAT) 0.4 MG SL tablet Place 1 tablet (0.4 mg total) under the tongue every 5 (five) minutes as needed for chest pain. 30 tablet 3  . ondansetron (ZOFRAN) 8 MG tablet Take 8 mg by mouth daily as needed for nausea or vomiting.     . OXYGEN Inhale 2 L into the lungs at bedtime. Reported on 11/25/2015    . oxymetazoline (AFRIN) 0.05 % nasal spray Place 1 spray into both nostrils continuous.      No current facility-administered medications on file prior to visit.      Past Medical History:  Diagnosis Date  . Anxiety   . Anxiety  disorder   . Arthritis    BILATERAL SHOULDERS, ELBOWS AND HANDS AND LEFT HIP AND KNEES--HX CORTISONE SHOTS IN SHOULDERS, ELBOWS, HIP AND KNEES  . Basal cell carcinoma of back 04/30/2016   Dermatologist- Dr Nevada Crane;   MOHS sx- Dr Levada Dy   . Bladder outlet obstruction   . Chronic idiopathic constipation 05/08/2015  . Complication of anesthesia    DIFFICULT WAKING   . COPD (chronic obstructive pulmonary disease) Gold C Frequent exacerbations 01/13/2014   [name] 07/08/14: FeV1 51% FeV1/FVC 66% FVC 59% 10/5/2015ONO RA was normal  10/13/2014  ONO on RA NORMAL   . COPD, frequent exacerbations ([place])    pulmologist-  dr Joya Gaskins--  Girtha Rm Stage C.04-25-15 recent COPD exacerbation-much improved now, after tx. in ER [facility].  Marland Kitchen Depression   . Diabetes mellitus without complication ([place])    BODERLINE - DIET CONTROL  . Dysrhythmia    PVC'S  . Family history of adverse reaction to anesthesia    father would wake up with agitation   . Former smoker 01/13/2014  . Gastroesophageal reflux disease without esophagitis   . GERD (gastroesophageal reflux disease)   . Heavy alcohol use 04/30/2016  . History of chronic bronchitis   . History of oxygen administration    oxygen use 2 l/m nasally at bedtime and exertional occasions  . History of rheumatic fever   . History of TB (tuberculosis)    1984--  hospitalized for 4 month treatment  . History of urinary retention   . Hx of multiple concussions    x 2 per patient   . Hypertension   . Melanoma ([place])   . Nocturnal oxygen desaturation    USES O2 NIGHTLY  . OSA (obstructive sleep apnea) 01/13/2014  . Pneumonia    hx of   . PONV (postoperative nausea and vomiting)   . Post-polio syndrome    polio at age 40--PT WAS IN IRON LUNG; PT WAS IN W/C UNTIL AGE 89; STILL HAS WEAKNESS RIGHT SIDE  . Prostate cancer ([place])   . Prostate cancer ([place])   . Schizophrenia ([place])   . Shortness of breath dyspnea    RIGHT HEMIDIAPHRAGM ELEVATION - POST POLIO SYNDROME  . Tuberculosis     Hosp 4 months rx , left early    Allergies  Allergen Reactions  . Aspirin Anaphylaxis  . [place]et Venom Anaphylaxis  . Ivp Dye [Iodinated Diagnostic Agents] Anaphylaxis  . Levaquin [Levofloxacin In D5w] Shortness Of Breath and Swelling    In addition: sweating, chest pain, and diarrhea.   . Nsaids Anaphylaxis  . Penicillins Anaphylaxis    Heart stops Has patient had a PCN reaction causing immediate rash, facial/tongue/throat swelling, SOB or lightheadedness with hypotension: yes Has patient had a  PCN reaction causing severe rash involving mucus membranes or skin necrosis: yes Has patient had a PCN reaction that required hospitalization yes Has patient had a PCN reaction occurring within the last 10 years: No If all of the above answers are "NO", then may proceed with Cephalosporin use.   . Tolmetin Anaphylaxis  . Buprenorphine Hcl Nausea And Vomiting    Can take with zofran   . Morphine And Related Nausea And Vomiting    Can take with zofran   . Oxycodone Itching and Rash    Family History  Problem Relation Age of Onset  . Alzheimer's disease Father 4    Deceased  . Stomach cancer Father   . Heart attack Father   . Heart disease Father   . Skin cancer Mother     Facial-Living  . Cancer Mother     skin  . Alcohol abuse Sister     x2  . Mental illness Sister     x2  . Alcohol abuse Sister   . Diabetes Maternal Aunt     x2  . Thyroid disease Maternal Aunt     x4  . Diabetes Maternal Uncle   . Tuberculosis Paternal Grandfather   . Tuberculosis Paternal Grandmother   . Alzheimer's disease Paternal Aunt   . Alzheimer's disease Paternal Uncle   . Colon cancer Neg Hx   . Colon polyps Neg Hx   . Crohn's disease Neg Hx   . Ulcerative colitis Neg Hx     Social History   Social History  . Marital status: Married    Spouse name: N/A  . Number of children: N/A  . Years of education: N/A   Occupational History  . Retired    Social History Main Topics  . Smoking  status: Former Smoker    Years: 47.00    Quit date: 08/25/2013  . Smokeless tobacco: Former Systems developer  . Alcohol use 3.6 oz/week    6 Cans of beer per week     Comment: daily  . Drug use: No     Comment: hx of marijuana use   . Sexual activity: Yes    Birth control/ protection: None   Other Topics Concern  . None   Social History Narrative  . None    Vitals:   08/06/16 1544  BP: 138/80  Pulse: 68  Resp: 12  Temp: 97.7 F (36.5 C)   O2 sat at RA 98%   Body mass index is 30.14 kg/m.     Physical Exam  Nursing note and vitals reviewed. Constitutional: He is oriented to person, place, and time. He appears well-developed. No distress.  HENT:  Head: Atraumatic.  Mouth/Throat: Oropharynx is clear and moist and mucous membranes are normal.  Eyes: Conjunctivae and EOM are normal. Pupils are equal, round, and reactive to light.  Neck: No JVD present. No thyroid mass and no thyromegaly present.  Cardiovascular: Normal rate, regular rhythm and normal heart sounds.   No murmur heard. Pulses:      Posterior tibial pulses are 2+ on the right side, and 2+ on the left side.  Respiratory: Effort normal and breath sounds normal. No respiratory distress.  GI: Soft. He exhibits no mass. There is no hepatomegaly. There is no tenderness.  Musculoskeletal: He exhibits no edema or tenderness.  Muscle atrophy ,mild, RLE  Neurological: He is alert and oriented to person, place, and time. He has normal strength. Coordination normal.  Stable gait with no assistance.  Skin: Skin  is warm. No erythema.  Psychiatric: He has a normal mood and affect. His speech is normal. Cognition and memory are normal.  Well groomed, good eye contact.      ASSESSMENT AND PLAN:     Burchell was seen today for establish care.  Diagnoses and all orders for this visit:    Chest pain, unspecified type  Possible causes discussed. Stress test 08/02/16:  Nuclear stress EF: 61%. Normal LV function  . There was no ST segment deviation noted during stress. The study is normal. No ischemia . no evidence of infarction This is a low risk study.  Clearly instructed about warning signs. Keep appt with Dr Stanford Breed.   Gastroesophageal reflux disease without esophagitis  Could be aggravating/causing chest pain, recommended taking Dexilant 60 mg daily for 3-4 weeks and monitor for chest pain changes. GERD precautions discussed. F/U in 6 weeks.   Essential hypertension  Adequately controlled. No changes in current management. DASH-low salt diet recommended. Eye exam recommended annually. F/U in 6 months, before if needed.  Anxiety  He has listed on problem list other psychiatric problems he did not mention: Bipolar,explosive personality disorder, alcoholism, and prior Hx of marijuana use. Apparently it has been recommended to follow with psychiatrists and to date he has not established with one. He states that he still has tabs of left of old Rx, at the time of visit Internet was down, so I could not check date of last Rx.   Chronic obstructive pulmonary disease, unspecified COPD type ([place])   Recommend continuing Breo and Albuterol inh, caution with Duoneb neb treatmenst at home. Some side effects of medications discussed. It seems like he is following with Dr Elsworth Soho.         Betty G. Martinique, MD  [facility]. [place] office.

## 2016-08-06 NOTE — Patient Instructions (Signed)
No Internet at the time of visit

## 2016-08-09 ENCOUNTER — Telehealth: Payer: Self-pay | Admitting: Cardiology

## 2016-08-09 NOTE — Telephone Encounter (Signed)
Discussed BP meds w patient. Notes he was taken off 2 while in hospital recently. He is unsure what his BPs are running at home, but he noted he will get in habit of doing daily/BID BP checks and see if any concerns w his trends. Thinks it is running higher but he's unsure. He also mentions times where he thinks his BP is dropping. Recommended he try to see what it's doing at these times. We also discussed intent/use of nitro and noted SE's, as he had questions about how/when to take this medication.  Pt aware to call again if he would like to update [name] on his BP trends, or call back if new concerns.

## 2016-08-09 NOTE — Telephone Encounter (Signed)
Please call,concerning his blood pressure medicine. Wants to know why they stopped him from taking 2 of them.

## 2016-08-13 ENCOUNTER — Ambulatory Visit (INDEPENDENT_AMBULATORY_CARE_PROVIDER_SITE_OTHER): Payer: Medicare Other

## 2016-08-13 ENCOUNTER — Ambulatory Visit: Payer: Self-pay | Admitting: Family Medicine

## 2016-08-13 DIAGNOSIS — R42 Dizziness and giddiness: Secondary | ICD-10-CM | POA: Diagnosis not present

## 2016-08-14 ENCOUNTER — Ambulatory Visit: Payer: Self-pay | Admitting: Endocrinology

## 2016-08-17 ENCOUNTER — Ambulatory Visit: Payer: Self-pay | Admitting: Internal Medicine

## 2016-08-18 ENCOUNTER — Emergency Department (HOSPITAL_BASED_OUTPATIENT_CLINIC_OR_DEPARTMENT_OTHER)
Admission: EM | Admit: 2016-08-18 | Discharge: 2016-08-18 | Disposition: A | Discharge status: Home / Self Care | Payer: Medicare Other | Attending: Emergency Medicine | Admitting: Emergency Medicine

## 2016-08-18 ENCOUNTER — Encounter (HOSPITAL_BASED_OUTPATIENT_CLINIC_OR_DEPARTMENT_OTHER): Payer: Self-pay | Admitting: Emergency Medicine

## 2016-08-18 ENCOUNTER — Emergency Department (HOSPITAL_BASED_OUTPATIENT_CLINIC_OR_DEPARTMENT_OTHER): Payer: Medicare Other

## 2016-08-18 DIAGNOSIS — Z7984 Long term (current) use of oral hypoglycemic drugs: Secondary | ICD-10-CM | POA: Diagnosis not present

## 2016-08-18 DIAGNOSIS — Z885 Allergy status to narcotic agent status: Secondary | ICD-10-CM | POA: Diagnosis not present

## 2016-08-18 DIAGNOSIS — J40 Bronchitis, not specified as acute or chronic: Secondary | ICD-10-CM | POA: Diagnosis not present

## 2016-08-18 DIAGNOSIS — G4733 Obstructive sleep apnea (adult) (pediatric): Secondary | ICD-10-CM | POA: Diagnosis not present

## 2016-08-18 DIAGNOSIS — Z87891 Personal history of nicotine dependence: Secondary | ICD-10-CM | POA: Diagnosis not present

## 2016-08-18 DIAGNOSIS — Z7951 Long term (current) use of inhaled steroids: Secondary | ICD-10-CM | POA: Insufficient documentation

## 2016-08-18 DIAGNOSIS — J449 Chronic obstructive pulmonary disease, unspecified: Secondary | ICD-10-CM | POA: Diagnosis not present

## 2016-08-18 DIAGNOSIS — Z79899 Other long term (current) drug therapy: Secondary | ICD-10-CM | POA: Insufficient documentation

## 2016-08-18 DIAGNOSIS — J441 Chronic obstructive pulmonary disease with (acute) exacerbation: Secondary | ICD-10-CM | POA: Diagnosis not present

## 2016-08-18 DIAGNOSIS — I10 Essential (primary) hypertension: Secondary | ICD-10-CM | POA: Insufficient documentation

## 2016-08-18 DIAGNOSIS — R0602 Shortness of breath: Secondary | ICD-10-CM | POA: Diagnosis not present

## 2016-08-18 DIAGNOSIS — E1140 Type 2 diabetes mellitus with diabetic neuropathy, unspecified: Secondary | ICD-10-CM | POA: Diagnosis not present

## 2016-08-18 DIAGNOSIS — F209 Schizophrenia, unspecified: Secondary | ICD-10-CM | POA: Diagnosis not present

## 2016-08-18 DIAGNOSIS — Z886 Allergy status to analgesic agent status: Secondary | ICD-10-CM | POA: Diagnosis not present

## 2016-08-18 DIAGNOSIS — R079 Chest pain, unspecified: Secondary | ICD-10-CM | POA: Diagnosis not present

## 2016-08-18 DIAGNOSIS — R05 Cough: Secondary | ICD-10-CM | POA: Diagnosis not present

## 2016-08-18 MED ORDER — ALBUTEROL (5 MG/ML) CONTINUOUS INHALATION SOLN
10.0000 mg/h | INHALATION_SOLUTION | RESPIRATORY_TRACT | Status: DC
  Administered 2016-08-18: 10 mg/h via RESPIRATORY_TRACT
  Filled 2016-08-18: qty 20

## 2016-08-18 MED ORDER — IPRATROPIUM-ALBUTEROL 0.5-2.5 (3) MG/3ML IN SOLN
RESPIRATORY_TRACT | Status: AC
  Administered 2016-08-18: 3 mL
  Filled 2016-08-18: qty 3

## 2016-08-18 NOTE — ED Notes (Signed)
ED Provider at bedside. 

## 2016-08-18 NOTE — ED Triage Notes (Signed)
Pt in c/o SOB after having a cold last week, pt actively wheezing. RRT in to assess. Pt states cannot take albuterol due to heart monitor. Pt is alert, interactive, speaking in complete sentences in NAD.

## 2016-08-18 NOTE — ED Provider Notes (Signed)
[facility] DEPT [facility] Provider Note   CSN: [id number] Arrival date & time: 08/18/16  1552  By signing my name below, I, Jeanell Sparrow, attest that this documentation has been prepared under the direction and in the presence of non-physician practitioner, Harlene Ramus, PA-C. Electronically Signed: Jeanell Sparrow, Scribe. 08/18/2016. 4:10 PM.  History   Chief Complaint Chief Complaint  Patient presents with  . Shortness of Breath   The history is provided by the patient and medical records. No language interpreter was used.   HPI Comments: [name] is a 65 y.o. male with a hx of COPD who presents to the Emergency Department complaining of intermittent moderate SOB that started about a week ago. He states he was having URI-like symptoms prior to onset of worsening SOB. He reports being advised not to take his albuterol inhaler by his PCP due to his heart monitor. He had a monitor placed 2 weeks ago due to atypical CP. Per medical record, he was admitted to the hospital on 07/21/16 for pre-syncope. He describes the SOB as exacerbated by laying down and states it feel consistent with his COPD exacerbations. He reports associated symptoms of chest tightness, chest congestion, productive cough, and wheezing. He denies any sick contacts. Reports hx of no smoking in the last 3 years. Denies fever, new lightheadedness, sore throat, hemoptysis, chest pain, heart palpitations, vomiting, diarrhea, abdominal pain, or other complaints.    Cardiologist: Dr. Stanford Breed  PCP: Betty Martinique, MD  Past Medical History:  Diagnosis Date  . Anxiety   . Anxiety disorder   . Arthritis    BILATERAL SHOULDERS, ELBOWS AND HANDS AND LEFT HIP AND KNEES--HX CORTISONE SHOTS IN SHOULDERS, ELBOWS, HIP AND KNEES  . Basal cell carcinoma of back 04/30/2016   Dermatologist- Dr Nevada Crane;   MOHS sx- Dr Levada Dy   . Bladder outlet obstruction   . Chronic idiopathic constipation 05/08/2015  . Complication of anesthesia    DIFFICULT WAKING   . COPD (chronic obstructive pulmonary disease) Gold C Frequent exacerbations 01/13/2014   [name] 07/08/14: FeV1 51% FeV1/FVC 66% FVC 59% 10/5/2015ONO RA was normal  10/13/2014  ONO on RA NORMAL   . COPD, frequent exacerbations ([place])    pulmologist-  dr Joya Gaskins--  [place] Rm Stage C.04-25-15 recent COPD exacerbation-much improved now, after tx. in ER [facility].  Marland Kitchen Depression   . Diabetes mellitus without complication ([place])    BODERLINE - DIET CONTROL  . Dysrhythmia    PVC'S  . Family history of adverse reaction to anesthesia    father would wake up with agitation   . Former smoker 01/13/2014  . Gastroesophageal reflux disease without esophagitis   . GERD (gastroesophageal reflux disease)   . Heavy alcohol use 04/30/2016  . History of chronic bronchitis   . History of oxygen administration    oxygen use 2 l/m nasally at bedtime and exertional occasions  . History of rheumatic fever   . History of TB (tuberculosis)    1984--  hospitalized for 4 month treatment  . History of urinary retention   . Hx of multiple concussions    x 2 per patient   . Hypertension   . Melanoma ([place])   . Nocturnal oxygen desaturation    USES O2 NIGHTLY  . OSA (obstructive sleep apnea) 01/13/2014  . Pneumonia    hx of   . PONV (postoperative nausea and vomiting)   . Post-polio syndrome    polio at age 67--PT WAS IN IRON LUNG; PT WAS IN W/C  UNTIL AGE 65; STILL HAS WEAKNESS RIGHT SIDE  . Prostate cancer ([place])   . Prostate cancer ([place])   . Schizophrenia ([place])   . Shortness of breath dyspnea    RIGHT HEMIDIAPHRAGM ELEVATION - POST POLIO SYNDROME  . Tuberculosis    Hosp 4 months rx , left early     Patient Active Problem List   Diagnosis Date Noted  . Shortness of breath   . Diabetes mellitus type 2 in obese ([place]) 06/26/2016  . non-specific Chest pain 06/12/2016  . Patient's noncompliance with other medical treatment and regimen 06/02/2016  . Hyperlipidemia, mixed 06/02/2016  . Elevated liver  enzymes  06/02/2016  . Schizophrenia ([place]) 04/30/2016  . Environmental and seasonal allergies 04/30/2016  . Diabetes mellitus without complication- (diet controlled; w/o proteinuria) 04/30/2016  . Heavy alcohol use- Many yrs  04/30/2016  . Basal cell carcinoma of back 04/30/2016  . Obesity 04/30/2016  . Hx of multiple concussions 04/30/2016  . h/o Vitamin D deficiency 04/30/2016  . Screen for sexually transmitted diseases 04/30/2016  . Immunocompromised state ([place]) 04/30/2016  . Cervical radiculopathy 01/15/2016  . Alcohol withdrawal ([place]) 11/22/2015  . h/o Suicidal thoughts 11/21/2015  . Dry mouth 10/07/2015  . Anxiety 09/19/2015  . OAB (overactive bladder) 07/27/2015  . Chronic idiopathic constipation 05/08/2015  . Screening for colon cancer   . Benign neoplasm of cecum   . Benign neoplasm of ascending colon   . Benign neoplasm of transverse colon   . Benign neoplasm of sigmoid colon   . Rectal polyp   . Gastroesophageal reflux disease without esophagitis   . Esophageal stricture   . Joint stiffness of multiple sites 10/17/2014  . Benign localized hyperplasia of prostate with urinary obstruction 09/29/2014  . concerns for memory loss 09/13/2014  . Tuberculosis   . Diverticulum of bladder 06/17/2014  . Neuropathy ([place]) 04/11/2014  . Post-polio syndrome 01/13/2014  . COPD (chronic obstructive pulmonary disease) ([place]) 01/13/2014  . Hypertension 01/13/2014  . Former smoker 01/13/2014  . h/o Prostate cancer 01/13/2014  . Bipolar disorder ([place]) 01/13/2014  . Explosive personality disorder 01/13/2014  . Erectile dysfunction 01/13/2014  . Nephrolithiasis 01/13/2014  . Chronic pain 01/13/2014  . Alcoholism ([place]) 01/13/2014  . OSA (obstructive sleep apnea) 01/13/2014    Past Surgical History:  Procedure Laterality Date  . CARDIOVASCULAR STRESS TEST  06-08-2014  dr Mare Ferrari   normal lexiscan study/  no ischemia/  not gated due to PAC's  . COLONOSCOPY N/A 05/03/2015    Procedure: COLONOSCOPY;  Surgeon: [name], MD;  Location: [facility];  Service: Endoscopy;  Laterality: N/A;  . CYSTOSCOPY N/A 10/25/2015   Procedure: CYSTOSCOPY;  Surgeon: [name], MD;  Location: [facility];  Service: Urology;  Laterality: N/A;  . CYSTOSCOPY W/ CYSTOGRAM/  TRANSRECTAL ULTRASOUND PROSTATE BX  03-22-2009  . ESOPHAGOGASTRODUODENOSCOPY N/A 03/22/2015   Procedure: ESOPHAGOGASTRODUODENOSCOPY (EGD) with dilation;  Surgeon: [name], MD;  Location: [facility];  Service: Endoscopy;  Laterality: N/A;  . excision of skin lesion    . LAPAROSCOPIC CHOLECYSTECTOMY  2013  . left elbow surgery      due to fracture   . NASAL SEPTUM SURGERY  2000  . OTHER SURGICAL HISTORY      Muscle & bone Graft/Polio  . polio surgeries      14 polio surgeries   . PROSTATE BIOPSY N/A 09/28/2014   Procedure: PROSTATE ULTRASOUND/BIOPSY;  Surgeon: [name], MD;  Location: [facility];  Service: Urology;  Laterality: N/A;  .  PROSTATE BIOPSY N/A 10/25/2015   Procedure: PROSTATE BIOPSY AND ULTRASOUND;  Surgeon: [name], MD;  Location: [facility];  Service: Urology;  Laterality: N/A;  . SAVORY DILATION N/A 03/22/2015   Procedure: SAVORY DILATION;  Surgeon: [name], MD;  Location: [facility];  Service: Endoscopy;  Laterality: N/A;  . SHOULDER ARTHROSCOPY WITH OPEN ROTATOR CUFF REPAIR Bilateral 2013  &  1999   removal spurs and bursectomy  . TRANSURETHRAL INCISION OF BLADDER NECK N/A 10/25/2015   Procedure:  TRANSURETHRAL INCISION OF BLADDER NECK;  Surgeon: [name], MD;  Location: [facility];  Service: Urology;  Laterality: N/A;  . TRANSURETHRAL RESECTION OF PROSTATE N/A 09/28/2014   Procedure: TRANSURETHRAL RESECTION OF THE PROSTATE (TURP);  Surgeon: [name], MD;  Location: [facility];  Service: Urology;  Laterality: N/A;  . URETEROSOPY STONE EXTRACTION  2000       Home Medications    Prior to Admission medications   Medication Sig Start Date End Date Taking? Authorizing Provider    acetaminophen (TYLENOL) 500 MG tablet Take 500 mg by mouth every 6 (six) hours as needed for headache (pain).    Historical Provider, MD  albuterol (PROVENTIL HFA;VENTOLIN HFA) 108 (90 Base) MCG/ACT inhaler Inhale 2 puffs into the lungs every 6 (six) hours as needed for wheezing or shortness of breath. 06/29/16   Rigoberto Noel, MD  ALPRAZolam [name]) 1 MG tablet Take 0.5 tablets (0.5 mg total) by mouth 3 (three) times daily as needed for anxiety. Patient taking differently: Take 1 mg by mouth 2 (two) times daily as needed for anxiety.  04/11/16   Debbrah Alar, NP  atorvastatin (LIPITOR) 40 MG tablet One tab q hs Patient taking differently: Take 40 mg by mouth at bedtime.  05/28/16   [name], DO  baclofen (LIORESAL) 10 MG tablet Take 1 tablet (10 mg total) by mouth 2 (two) times daily. Patient taking differently: Take 10 mg by mouth at bedtime as needed (leg cramps).  04/03/16   Brunetta Jeans, PA-C  Blood Glucose Monitoring Suppl (ACCU-CHEK AVIVA) device Use to check blood sugars every morning fasting, 2 hours after largest meal and as needed with low blood sugar symptoms 06/12/16 06/12/17  [name], DO  dexlansoprazole (DEXILANT) 60 MG capsule Take 1 capsule (60 mg total) by mouth daily. Patient taking differently: Take 60 mg by mouth every other day.  12/21/15   Brunetta Jeans, PA-C  EPINEPHrine 0.3 mg/0.3 mL IJ SOAJ injection Inject 0.3 mLs (0.3 mg total) into the muscle once. Patient taking differently: Inject 0.3 mg into the muscle once as needed (severe allergic reactions).  02/24/16   Malvin Johns, MD  fluticasone (FLONASE) 50 MCG/ACT nasal spray Place 2 sprays into both nostrils daily. Patient taking differently: Place 2 sprays into both nostrils daily as needed for allergies or rhinitis (congestion).  08/03/15   Brunetta Jeans, PA-C  fluticasone furoate-vilanterol (BREO ELLIPTA) 100-25 MCG/INH AEPB INHALE 1 PUFF BY MOUTH EVERY MORNING Patient taking differently: Inhale 1  puff into the lungs daily. INHALE 1 PUFF BY MOUTH EVERY MORNIN 06/29/16   Rigoberto Noel, MD  glucose blood (ACCU-CHEK AVIVA) test strip Check blood sugars every morning fasting, 2 hours after largest meal and as needed with low blood sugar symptoms 06/12/16   [name], DO  HYDROcodone-acetaminophen (NORCO/VICODIN) 5-325 MG tablet Take 1 tablet by mouth every 6 (six) hours as needed for moderate pain.    Historical Provider, MD  ipratropium-albuterol (DUONEB) 0.5-2.5 (3) MG/3ML  SOLN Take 3 mLs by nebulization every 4 (four) hours as needed (shortness of breath and wheezing).    Historical Provider, MD  Lancet Devices [facility]) lancets Check blood sugars every morning fasting, 2 hours after largest meal and as needed with low blood sugar symptoms 06/12/16   [name], DO  meclizine (ANTIVERT) 25 MG tablet Take 25 mg by mouth 3 (three) times daily as needed for dizziness.    Historical Provider, MD  metFORMIN (GLUCOPHAGE-XR) 500 MG 24 hr tablet Take 1 tablet (500 mg total) by mouth daily with breakfast. 07/17/16   Renato Shin, MD  metoprolol succinate (TOPROL-XL) 25 MG 24 hr tablet Take 1 tablet (25 mg total) by mouth daily. 07/06/16   Deborah Opalski, DO  montelukast (SINGULAIR) 10 MG tablet TAKE 1 TABLET (10 MG TOTAL) BY MOUTH AT BEDTIME. Patient taking differently: Take 10 mg by mouth at bedtime as needed (seasonal allergies).  04/02/16   Brunetta Jeans, PA-C  nitroGLYCERIN (NITROSTAT) 0.4 MG SL tablet Place 1 tablet (0.4 mg total) under the tongue every 5 (five) minutes as needed for chest pain. 06/21/15   Brunetta Jeans, PA-C  ondansetron (ZOFRAN) 8 MG tablet Take 8 mg by mouth daily as needed for nausea or vomiting.  02/21/16   Historical Provider, MD  OXYGEN Inhale 2 L into the lungs at bedtime. Reported on 11/25/2015    Historical Provider, MD  oxymetazoline (AFRIN) 0.05 % nasal spray Place 1 spray into both nostrils continuous.     Historical Provider, MD    Family  History Family History  Problem Relation Age of Onset  . Alzheimer's disease Father 4    Deceased  . Stomach cancer Father   . Heart attack Father   . Heart disease Father   . Skin cancer Mother     Facial-Living  . Cancer Mother     skin  . Alcohol abuse Sister     x2  . Mental illness Sister     x2  . Alcohol abuse Sister   . Diabetes Maternal Aunt     x2  . Thyroid disease Maternal Aunt     x4  . Diabetes Maternal Uncle   . Tuberculosis Paternal Grandfather   . Tuberculosis Paternal Grandmother   . Alzheimer's disease Paternal Aunt   . Alzheimer's disease Paternal Uncle   . Colon cancer Neg Hx   . Colon polyps Neg Hx   . Crohn's disease Neg Hx   . Ulcerative colitis Neg Hx     Social History Social History  Substance Use Topics  . Smoking status: Former Smoker    Years: 47.00    Quit date: 08/25/2013  . Smokeless tobacco: Former Systems developer  . Alcohol use 3.6 oz/week    6 Cans of beer per week     Comment: daily     Allergies   Aspirin; Hornet venom; Ivp dye [iodinated diagnostic agents]; Levaquin [levofloxacin in d5w]; Nsaids; Penicillins; Tolmetin; Buprenorphine hcl; Morphine and related; and Oxycodone   Review of Systems Review of Systems  Constitutional: Negative for fever.  HENT: Negative for sore throat.   Respiratory: Positive for cough, chest tightness, shortness of breath and wheezing.   Cardiovascular: Negative for chest pain and palpitations.  Gastrointestinal: Negative for abdominal pain, diarrhea and vomiting.  Neurological: Negative for light-headedness.  All other systems reviewed and are negative.    Physical Exam Updated Vital Signs BP (!) 121/105 (BP Location: Right Arm)   Pulse 67   Temp  97.9 F (36.6 C) (Oral)   Resp 16   Ht 6' (1.829 m)   Wt 222 lb (100.7 kg)   SpO2 99%   BMI 30.11 kg/m   Physical Exam  Constitutional: He is oriented to person, place, and time. He appears well-developed and well-nourished. No distress.   HENT:  Head: Normocephalic and atraumatic.  Mouth/Throat: Uvula is midline, oropharynx is clear and moist and mucous membranes are normal. No oropharyngeal exudate, posterior oropharyngeal edema, posterior oropharyngeal erythema or tonsillar abscesses. No tonsillar exudate.  Eyes: Conjunctivae and EOM are normal. Right eye exhibits no discharge. Left eye exhibits no discharge. No scleral icterus.  Neck: Normal range of motion. Neck supple.  Cardiovascular: Normal rate, regular rhythm, normal heart sounds and intact distal pulses.   Pulmonary/Chest: Effort normal. No respiratory distress. He has wheezes. He has no rales. He exhibits no tenderness.  Diffuse expiratory wheezes throughout bilateral lobes.   Abdominal: Soft. Bowel sounds are normal. He exhibits no distension and no mass. There is no tenderness. There is no rebound and no guarding. No hernia.  Musculoskeletal: Normal range of motion. He exhibits no edema.  Neurological: He is alert and oriented to person, place, and time.  Skin: Skin is warm and dry. He is not diaphoretic.  Nursing note and vitals reviewed.   ED Treatments / Results  DIAGNOSTIC STUDIES: Oxygen Saturation is 99% on RA, normal by my interpretation.    COORDINATION OF CARE: 4:14 PM- Pt advised of plan for treatment and pt agrees.  Labs (all labs ordered are listed, but only abnormal results are displayed) Labs Reviewed - No data to display  EKG  EKG Interpretation  Date/Time:  Saturday August 18 2016 17:25:50 EDT Ventricular Rate:  56 PR Interval:    QRS Duration: 105 QT Interval:  422 QTC Calculation: 408 R Axis:   58 Text Interpretation:  Sinus rhythm Atrial premature complex RSR' in V1 or V2, right VCD or RVH No significant change since last tracing Confirmed by [facility] MD, Antonito ([id number]) on 08/18/2016 6:22:20 PM       Radiology Dg Chest 2 View  Result Date: 08/18/2016 CLINICAL DATA:  65 year-old male c/o productive cough w/ yellow phlegm  and SOB x 1 week. Pt had hx a pre-syncopal episode and was hospitalized from 07/20/2016 to 07/22/2016. He had stress test a week after discharge and had some complications du.*comment was truncated* EXAM: CHEST  2 VIEW COMPARISON:  07/21/2016 FINDINGS: Patient rotated minimally left on the frontal. Midline trachea. Normal heart size and mediastinal contours. Numerous leads and wires project over the chest. No pleural effusion or pneumothorax. Clear lungs. IMPRESSION: No acute cardiopulmonary disease. Electronically Signed   By: Abigail Miyamoto M.D.   On: 08/18/2016 17:13    Procedures Procedures (including critical care time)  Medications Ordered in ED Medications  ipratropium-albuterol (DUONEB) 0.5-2.5 (3) MG/3ML nebulizer solution (3 mLs  Given 08/18/16 1620)     Initial Impression / Assessment and Plan / ED Course  I have reviewed the triage vital signs and the nursing notes.  Pertinent labs & imaging results that were available during my care of the patient were reviewed by me and considered in my medical decision making (see chart for details).  Clinical Course    Patient presents with shortness of breath that has been present for the past week. Reports associated wheezing and chest tightness. Hx of GERD, hypertension, atypical angina, anxiety, COPD, prostate cancer, DM II, and s/p of polio with residual right-sided weakness.  Denies fever, CP, hemoptysis. VSS. Exam revealed diffuse expiratory wheezing throughout, no signs of respiratory distress. Remaining exam unremarkable. Patient given DuoNeb treatment.  Chart review shows patient was admitted on 10/7 for a couple event. Patient was noted to have negative troponins, was evaluated by cardiology and arrangement was set up for outpatient [facility]. Patient was also evaluated the month prior for atypical chest pain, normal TTE. Myocardial perfusion performed on 10/19 showed normal study with EF 61%. Pt was evaluated by cardiology on 10/30 and was  placed on 30d monitor.   Reevaluation patient reports his shortness of breath has mildly improved. Reexamination revealed mild improvement of wheezing. Continuous neb ordered. EKG showed sinus rhythm with no acute ischemic changes, no significant changes from prior. Chest x-ray negative. On reevaluation patient reports significant improvement of shortness of breath and wheezing. Reexamination revealed significant improvement of wheezing throughout. VSS. Patient continues to deny any chest pain. Suspect patient's symptoms are likely related to COPD exacerbation. I have a low suspicion for ACS, PE, dissection, or other acute cardiac event at this time.  Patient reports he has a follow-up appointment scheduled with his cardiologist, Dr. Stanford Breed, in 2 weeks. He also reports having an appointment scheduled with his pulmonologist, Dr. Elsworth Soho, next month. Plan to discharge patient home with symptomatically treatment and advised him to continue using his home albuterol neb treatments as needed. Advised patient to follow up with his PCP within the next week if his symptoms have improved. Discussed return precautions.  Final Clinical Impressions(s) / ED Diagnoses   Final diagnoses:  COPD exacerbation [facility])    New Prescriptions Discharge Medication List as of 08/18/2016  7:10 PM     I personally performed the services described in this documentation, which was scribed in my presence. The recorded information has been reviewed and is accurate.     Chesley Noon [place], [place] 08/19/16 [id number]    Gareth Morgan, MD 08/19/16 1319

## 2016-08-18 NOTE — Discharge Instructions (Signed)
Continue taking your home inhalers and neb treatments as prescribed as needed for shortness of breath/wheezing. Continue drinking fluids at home to remain hydrated.  I recommend following up with her primary care provider within the next week if your symptoms have not improved. Follow-up with your cardiologist and pulmonologist at your scheduled appointments over the next few weeks. Please return to the Emergency Department if symptoms worsen or new onset of fever, headache, lightheadedness/dizziness, shortness of breath, productive cough, coughing up blood, chest pain, palpitations, abdominal pain, vomiting.

## 2016-08-18 NOTE — ED Notes (Signed)
Patient transported to X-ray 

## 2016-08-21 ENCOUNTER — Ambulatory Visit (INDEPENDENT_AMBULATORY_CARE_PROVIDER_SITE_OTHER): Payer: Medicare Other | Admitting: Internal Medicine

## 2016-08-21 ENCOUNTER — Encounter: Payer: Self-pay | Admitting: Pulmonary Disease

## 2016-08-21 ENCOUNTER — Encounter: Payer: Self-pay | Admitting: Internal Medicine

## 2016-08-21 ENCOUNTER — Ambulatory Visit (INDEPENDENT_AMBULATORY_CARE_PROVIDER_SITE_OTHER): Payer: Medicare Other | Admitting: Pulmonary Disease

## 2016-08-21 ENCOUNTER — Ambulatory Visit (INDEPENDENT_AMBULATORY_CARE_PROVIDER_SITE_OTHER)
Admission: RE | Admit: 2016-08-21 | Discharge: 2016-08-21 | Disposition: A | Discharge status: Home / Self Care | Payer: Medicare Other | Source: Ambulatory Visit | Attending: Pulmonary Disease | Admitting: Pulmonary Disease

## 2016-08-21 VITALS — BP 104/66 | HR 68 | Ht 70.0 in | Wt 221.6 lb

## 2016-08-21 VITALS — BP 110/58 | HR 68 | Ht 70.25 in | Wt 219.5 lb

## 2016-08-21 DIAGNOSIS — J441 Chronic obstructive pulmonary disease with (acute) exacerbation: Secondary | ICD-10-CM

## 2016-08-21 DIAGNOSIS — R110 Nausea: Secondary | ICD-10-CM | POA: Diagnosis not present

## 2016-08-21 DIAGNOSIS — K219 Gastro-esophageal reflux disease without esophagitis: Secondary | ICD-10-CM

## 2016-08-21 DIAGNOSIS — K5909 Other constipation: Secondary | ICD-10-CM | POA: Diagnosis not present

## 2016-08-21 DIAGNOSIS — R05 Cough: Secondary | ICD-10-CM | POA: Diagnosis not present

## 2016-08-21 DIAGNOSIS — K222 Esophageal obstruction: Secondary | ICD-10-CM | POA: Diagnosis not present

## 2016-08-21 MED ORDER — DOXYCYCLINE HYCLATE 100 MG PO TABS: 100.0000 mg | ORAL_TABLET | Freq: Two times a day (BID) | ORAL | 0 refills | Status: DC

## 2016-08-21 MED ORDER — PREDNISONE 10 MG PO TABS: ORAL_TABLET | ORAL | 0 refills | Status: DC

## 2016-08-21 NOTE — Progress Notes (Signed)
[name]    [id number]    [birth year]  Primary Care Physician:Betty Martinique, MD  Referring Physician: Betty G Martinique, MD [address] [place], [place] [address]  Chief complaint:  Acute visit for cough, dyspnea.  HPI: 65 yo former smoker with COPD , bronchitis, former smoker and OSA . He was seen in the ED admission to [facility] on 11/4 for wheezing, chest tightness. He is given a DuoNeb which improvement in symptoms. After he went back home he was trying to light: Cystoscopy and was exposed to heat, fumes, possible smoke exposure with exacerbation of symptoms. He went back to the ED and was given more nebs, prednisone taper with no improvement in symptoms. He had a chest x-ray at that point which did not show any acute acute cardiopulmonary abnormality.  His chief complaints now are cough, sputum production, chest tightness, wheezing. He denies any fevers, chills.   Outpatient Encounter Prescriptions as of 08/21/2016  Medication Sig  . acetaminophen (TYLENOL) 500 MG tablet Take 500 mg by mouth every 6 (six) hours as needed for headache (pain).  Marland Kitchen albuterol (PROVENTIL HFA;VENTOLIN HFA) 108 (90 Base) MCG/ACT inhaler Inhale 2 puffs into the lungs every 6 (six) hours as needed for wheezing or shortness of breath.  . ALPRAZolam (XANAX) 1 MG tablet Take 0.5 tablets (0.5 mg total) by mouth 3 (three) times daily as needed for anxiety. (Patient taking differently: Take 1 mg by mouth 2 (two) times daily as needed for anxiety. )  . atorvastatin (LIPITOR) 40 MG tablet One tab q hs (Patient taking differently: Take 40 mg by mouth at bedtime. )  . Blood Glucose Monitoring Suppl (ACCU-CHEK AVIVA) device Use to check blood sugars every morning fasting, 2 hours after largest meal and as needed with low blood sugar symptoms  . dexlansoprazole (DEXILANT) 60 MG capsule Take 60 mg by mouth every 3 (three) days.  Marland Kitchen EPINEPHrine 0.3 mg/0.3 mL IJ SOAJ injection Inject 0.3 mLs (0.3 mg  total) into the muscle once. (Patient taking differently: Inject 0.3 mg into the muscle once as needed (severe allergic reactions). )  . fluticasone (FLONASE) 50 MCG/ACT nasal spray Place 2 sprays into both nostrils daily. (Patient taking differently: Place 2 sprays into both nostrils daily as needed for allergies or rhinitis (congestion). )  . fluticasone furoate-vilanterol (BREO ELLIPTA) 100-25 MCG/INH AEPB INHALE 1 PUFF BY MOUTH EVERY MORNING (Patient taking differently: Inhale 1 puff into the lungs daily. INHALE 1 PUFF BY MOUTH EVERY MORNIN)  . HYDROcodone-acetaminophen (NORCO/VICODIN) 5-325 MG tablet Take 1 tablet by mouth every 6 (six) hours as needed for moderate pain.  Marland Kitchen ipratropium-albuterol (DUONEB) 0.5-2.5 (3) MG/3ML SOLN Take 3 mLs by nebulization every 4 (four) hours as needed (shortness of breath and wheezing).  . metoprolol succinate (TOPROL-XL) 25 MG 24 hr tablet Take 1 tablet (25 mg total) by mouth daily.  . montelukast (SINGULAIR) 10 MG tablet TAKE 1 TABLET (10 MG TOTAL) BY MOUTH AT BEDTIME. (Patient taking differently: Take 10 mg by mouth at bedtime as needed (seasonal allergies). )  . nitroGLYCERIN (NITROSTAT) 0.4 MG SL tablet Place 1 tablet (0.4 mg total) under the tongue every 5 (five) minutes as needed for chest pain.  Marland Kitchen ondansetron (ZOFRAN) 8 MG tablet Take 8 mg by mouth daily as needed for nausea or vomiting.   . OXYGEN Inhale 2 L into the lungs at bedtime. Reported on 11/25/2015  . oxymetazoline (AFRIN) 0.05 % nasal spray Place 1  spray into both nostrils continuous.   . predniSONE (DELTASONE) 10 MG tablet Take 20 mg by mouth 3 (three) times daily.  . sodium phosphate (FLEET) 7-19 GM/118ML ENEM Place 1 enema rectally as needed for severe constipation.  . [DISCONTINUED] dexlansoprazole (DEXILANT) 60 MG capsule Take 1 capsule (60 mg total) by mouth daily. (Patient taking differently: Take 60 mg by mouth every other day. )  . metFORMIN (GLUCOPHAGE-XR) 500 MG 24 hr tablet Take 1  tablet (500 mg total) by mouth daily with breakfast. (Patient not taking: Reported on 08/21/2016)   No facility-administered encounter medications on file as of 08/21/2016.     Allergies as of 08/21/2016 - Review Complete 08/21/2016  Allergen Reaction Noted  . Aspirin Anaphylaxis 11/20/2011  . Hornet venom Anaphylaxis 07/22/2016  . Ivp dye [iodinated diagnostic agents] Anaphylaxis 11/20/2011  . Levaquin [levofloxacin in d5w] Shortness Of Breath and Swelling 08/23/2014  . Nsaids Anaphylaxis 01/14/2014  . Penicillins Anaphylaxis 11/20/2011  . Tolmetin Anaphylaxis 11/22/2015  . Buprenorphine hcl Nausea And Vomiting 11/22/2015  . Morphine and related Nausea And Vomiting 01/21/2012  . Oxycodone Itching and Rash 02/21/2016    Past Medical History:  Diagnosis Date  . Anxiety   . Anxiety disorder   . Arthritis    BILATERAL SHOULDERS, ELBOWS AND HANDS AND LEFT HIP AND KNEES--HX CORTISONE SHOTS IN SHOULDERS, ELBOWS, HIP AND KNEES  . Basal cell carcinoma of back 04/30/2016   Dermatologist- Dr Nevada Crane;   MOHS sx- Dr Levada Dy   . Bladder outlet obstruction   . Chronic idiopathic constipation 05/08/2015  . Complication of anesthesia    DIFFICULT WAKING   . COPD (chronic obstructive pulmonary disease) Gold C Frequent exacerbations 01/13/2014   [name] 07/08/14: FeV1 51% FeV1/FVC 66% FVC 59% 10/5/2015ONO RA was normal  10/13/2014  ONO on RA NORMAL   . COPD, frequent exacerbations ([place])    pulmologist-  dr Joya Gaskins--  Girtha Rm Stage C.04-25-15 recent COPD exacerbation-much improved now, after tx. in ER [facility].  Marland Kitchen Depression   . Diabetes mellitus without complication ([place])    BODERLINE - DIET CONTROL  . Dysrhythmia    PVC'S  . Family history of adverse reaction to anesthesia    father would wake up with agitation   . Former smoker 01/13/2014  . Gastroesophageal reflux disease without esophagitis   . GERD (gastroesophageal reflux disease)   . Heavy alcohol use 04/30/2016  . History of chronic bronchitis     . History of oxygen administration    oxygen use 2 l/m nasally at bedtime and exertional occasions  . History of rheumatic fever   . History of TB (tuberculosis)    1984--  hospitalized for 4 month treatment  . History of urinary retention   . Hx of multiple concussions    x 2 per patient   . Hypertension   . Melanoma ([place])   . Nocturnal oxygen desaturation    USES O2 NIGHTLY  . OSA (obstructive sleep apnea) 01/13/2014  . Pneumonia    hx of   . PONV (postoperative nausea and vomiting)   . Post-polio syndrome    polio at age 89--PT WAS IN IRON LUNG; PT WAS IN W/C UNTIL AGE 26; STILL HAS WEAKNESS RIGHT SIDE  . Prostate cancer ([place])   . Prostate cancer ([place])   . Schizophrenia ([place])   . Shortness of breath dyspnea    RIGHT HEMIDIAPHRAGM ELEVATION - POST POLIO SYNDROME  . Tuberculosis    Hosp 4 months rx , left early  Past Surgical History:  Procedure Laterality Date  . CARDIOVASCULAR STRESS TEST  06-08-2014  dr Mare Ferrari   normal lexiscan study/  no ischemia/  not gated due to PAC's  . COLONOSCOPY N/A 05/03/2015   Procedure: COLONOSCOPY;  Surgeon: [name], MD;  [facility]: [facility];  Service: Endoscopy;  Laterality: N/A;  . CYSTOSCOPY N/A 10/25/2015   Procedure: CYSTOSCOPY;  Surgeon: [name], MD;  [facility]: [facility];  Service: Urology;  Laterality: N/A;  . CYSTOSCOPY W/ CYSTOGRAM/  TRANSRECTAL ULTRASOUND PROSTATE BX  03-22-2009  . ESOPHAGOGASTRODUODENOSCOPY N/A 03/22/2015   Procedure: ESOPHAGOGASTRODUODENOSCOPY (EGD) with dilation;  Surgeon: [name], MD;  [facility];  Service: Endoscopy;  Laterality: N/A;  . excision of skin lesion    . LAPAROSCOPIC CHOLECYSTECTOMY  2013  . left elbow surgery      due to fracture   . NASAL SEPTUM SURGERY  2000  . OTHER SURGICAL HISTORY      Muscle & bone Graft/Polio  . polio surgeries      14 polio surgeries   . PROSTATE BIOPSY N/A 09/28/2014   Procedure: PROSTATE ULTRASOUND/BIOPSY;  Surgeon: [name], MD;   [facility];  Service: Urology;  Laterality: N/A;  . PROSTATE BIOPSY N/A 10/25/2015   Procedure: PROSTATE BIOPSY AND ULTRASOUND;  Surgeon: [name], MD;  [facility];  Service: Urology;  Laterality: N/A;  . SAVORY DILATION N/A 03/22/2015   Procedure: SAVORY DILATION;  Surgeon: [name], MD;  [facility];  Service: Endoscopy;  Laterality: N/A;  . SHOULDER ARTHROSCOPY WITH OPEN ROTATOR CUFF REPAIR Bilateral 2013  &  1999   removal spurs and bursectomy  . TRANSURETHRAL INCISION OF BLADDER NECK N/A 10/25/2015   Procedure:  TRANSURETHRAL INCISION OF BLADDER NECK;  Surgeon: [name], MD;  [facility];  Service: Urology;  Laterality: N/A;  . TRANSURETHRAL RESECTION OF PROSTATE N/A 09/28/2014   Procedure: TRANSURETHRAL RESECTION OF THE PROSTATE (TURP);  Surgeon: [name], MD;  [facility];  Service: Urology;  Laterality: N/A;  . URETEROSOPY STONE EXTRACTION  2000    Family History  Problem Relation Age of Onset  . Alzheimer's disease Father 4    Deceased  . Stomach cancer Father   . Heart attack Father   . Heart disease Father   . Skin cancer Mother     Facial-Living  . Cancer Mother     skin  . Alcohol abuse Sister     x2  . Mental illness Sister     x2  . Alcohol abuse Sister   . Diabetes Maternal Aunt     x2  . Thyroid disease Maternal Aunt     x4  . Diabetes Maternal Uncle   . Tuberculosis Paternal Grandfather   . Tuberculosis Paternal Grandmother   . Alzheimer's disease Paternal Aunt   . Alzheimer's disease Paternal Uncle   . Colon cancer Neg Hx   . Colon polyps Neg Hx   . Crohn's disease Neg Hx   . Ulcerative colitis Neg Hx     Social History   Social History  . Marital status: Married    Spouse name: N/A  . Number of children: N/A  . Years of education: N/A   Occupational History  . Retired    Social History Main Topics  . Smoking status: Former Smoker    Years: 47.00    Quit date: 08/25/2013  . Smokeless tobacco:  Former Systems developer  . Alcohol use 3.6 oz/week  6 Cans of beer per week     Comment: daily  . Drug use: No     Comment: hx of marijuana use   . Sexual activity: Yes    Birth control/ protection: None   Other Topics Concern  . Not on file   Social History Narrative  . No narrative on file     Review of systems: Review of Systems  Constitutional: Negative for fever and chills.  HENT: Negative.   Eyes: Negative for blurred vision.  Respiratory: as per HPI  Cardiovascular: Negative for chest pain and palpitations.  Gastrointestinal: Negative for vomiting, diarrhea, blood per rectum. Genitourinary: Negative for dysuria, urgency, frequency and hematuria.  Musculoskeletal: Negative for myalgias, back pain and joint pain.  Skin: Negative for itching and rash.  Neurological: Negative for dizziness, tremors, focal weakness, seizures and loss of consciousness.  Endo/Heme/Allergies: Negative for environmental allergies.  Psychiatric/Behavioral: Negative for depression, suicidal ideas and hallucinations.  All other systems reviewed and are negative.   Physical Exam: Blood pressure 104/66, pulse 68, height 5\' 10"  (1.778 m), weight 221 lb 9.6 oz (100.5 kg), SpO2 96 %. Gen:      No acute distress HEENT:  EOMI, sclera anicteric Neck:     No masses; no thyromegaly Lungs:    B/L exp wheeze; normal respiratory effort CV:         Regular rate and rhythm; no murmurs Abd:      + bowel sounds; soft, non-tender; no palpable masses, no distension Ext:    No edema; adequate peripheral perfusion Skin:      Warm and dry; no rash Neuro: alert and oriented x 3 Psych: normal mood and affect  Data Reviewed: [name] 07/08/14: FeV1 51% FeV1/FVC 66% FVC 59% 10/5/2015ONO RA was normal  10/13/2014  ONO on RA NORMAL  Assessment:  COPD with exacerbation Acute bronchitis.  Mr. Rullan is in the middle of an acute exacerbation which is slow to resolve. His symptoms are worsened by recent heat, smoke exposure  from a wooden stove. We'll get a chest x-ray today and give him another course of prednisone starting at 60 mg. Slow taper by 10 mg every 3 days. He'll get doxycycline for 7 days He continued on the breo. I'll add duo nebs as needed for acute symptoms.  Plan/Recommendations: - CXR - Prednisone taper - Doxy 100 mg bid for 7 days - Duo nebs as needed.   Marshell Garfinkel MD [facility] Pager [phone] 08/21/2016, 11:32 AM  CC: Martinique, Betty G, MD

## 2016-08-21 NOTE — Patient Instructions (Addendum)
We get a chest x-ray today. We started on prednisone starting at 60 mg. Reduce dose to 10 mg every 3 days. We'll give doxycycline 100 mg twice a day for 7 days.  Continue using the Breo. We will give a prescription for DuoNeb to be used as needed for excessive wheezing, shortness of breath.

## 2016-08-21 NOTE — Patient Instructions (Signed)
Use Miralax for constipation, adjusting as needed

## 2016-08-21 NOTE — Progress Notes (Signed)
HISTORY OF PRESENT ILLNESS:  [name] is a 65 y.o. male with multiple significant medical problems including oxygen requiring COPD and multiple psychiatric diagnoses who presents today with a chief complaint of chronic constipation. Patient was evaluated in this office by the GI physician assistant May 2016 regarding GERD and dysphagia. Previous GI evaluations and treatment in [facility] 2008 including colonoscopy which was normal and upper endoscopy with esophageal dilation. The patient subsequently underwent upper endoscopy with esophageal dilation to 18 mm 03/22/2015. This for 14 mm peptic stricture. No other abnormalities. He has been on PPI. Previously prescribed omeprazole. Currently taking lansoprazole. Not certain how compliant. Patient denies recurrent dysphagia. Does have intermittent epigastric discomfort and nausea. He also underwent colonoscopy in July 2016. He was found to have multiple adenomas and tubulovillous adenoma for which follow-up in 3 years recommended. He reports having had problems with constipation for years. He has had weight gain. No bleeding. He takes enemas 3 times per week. MiraLAX previously was tolerated well at once daily dosage but worked incompletely. Stimulant laxatives cause him nausea. She does take Vicodin claims this is infrequent approximately once per week. Currently on Holter monitor.  REVIEW OF SYSTEMS:  All non-GI ROS negative except for sinus, anxiety, arthritis, cough, headaches, heart rhythm change, sore throat, shortness of breath  Past Medical History:  Diagnosis Date  . Anxiety   . Anxiety disorder   . Arthritis    BILATERAL SHOULDERS, ELBOWS AND HANDS AND LEFT HIP AND KNEES--HX CORTISONE SHOTS IN SHOULDERS, ELBOWS, HIP AND KNEES  . Basal cell carcinoma of back 04/30/2016   Dermatologist- Dr Nevada Crane;   MOHS sx- Dr Levada Dy   . Bladder outlet obstruction   . Chronic idiopathic constipation 05/08/2015  . Complication of anesthesia     DIFFICULT WAKING   . COPD (chronic obstructive pulmonary disease) Gold C Frequent exacerbations 01/13/2014   [name] 07/08/14: FeV1 51% FeV1/FVC 66% FVC 59% 10/5/2015ONO RA was normal  10/13/2014  ONO on RA NORMAL   . COPD, frequent exacerbations ([place])    pulmologist-  dr Joya Gaskins--  Girtha Rm Stage C.04-25-15 recent COPD exacerbation-much improved now, after tx. in ER [facility].  Marland Kitchen Depression   . Diabetes mellitus without complication ([place])    BODERLINE - DIET CONTROL  . Dysrhythmia    PVC'S  . Family history of adverse reaction to anesthesia    father would wake up with agitation   . Former smoker 01/13/2014  . Gastroesophageal reflux disease without esophagitis   . GERD (gastroesophageal reflux disease)   . Heavy alcohol use 04/30/2016  . History of chronic bronchitis   . History of oxygen administration    oxygen use 2 l/m nasally at bedtime and exertional occasions  . History of rheumatic fever   . History of TB (tuberculosis)    1984--  hospitalized for 4 month treatment  . History of urinary retention   . Hx of multiple concussions    x 2 per patient   . Hypertension   . Melanoma ([place])   . Nocturnal oxygen desaturation    USES O2 NIGHTLY  . OSA (obstructive sleep apnea) 01/13/2014  . Pneumonia    hx of   . PONV (postoperative nausea and vomiting)   . Post-polio syndrome    polio at age 46--PT WAS IN IRON LUNG; PT WAS IN W/C UNTIL AGE 66; STILL HAS WEAKNESS RIGHT SIDE  . Prostate cancer ([place])   . Prostate cancer ([place])   . Schizophrenia ([place])   .  Shortness of breath dyspnea    RIGHT HEMIDIAPHRAGM ELEVATION - POST POLIO SYNDROME  . Tuberculosis    Hosp 4 months rx , left early     Past Surgical History:  Procedure Laterality Date  . CARDIOVASCULAR STRESS TEST  06-08-2014  dr Mare Ferrari   normal lexiscan study/  no ischemia/  not gated due to PAC's  . COLONOSCOPY N/A 05/03/2015   Procedure: COLONOSCOPY;  Surgeon: [name], MD;  [facility];  Service: Endoscopy;   Laterality: N/A;  . CYSTOSCOPY N/A 10/25/2015   Procedure: CYSTOSCOPY;  Surgeon: [name], MD;  [facility];  Service: Urology;  Laterality: N/A;  . CYSTOSCOPY W/ CYSTOGRAM/  TRANSRECTAL ULTRASOUND PROSTATE BX  03-22-2009  . ESOPHAGOGASTRODUODENOSCOPY N/A 03/22/2015   Procedure: ESOPHAGOGASTRODUODENOSCOPY (EGD) with dilation;  Surgeon: [name], MD;  [facility];  Service: Endoscopy;  Laterality: N/A;  . excision of skin lesion    . LAPAROSCOPIC CHOLECYSTECTOMY  2013  . left elbow surgery      due to fracture   . NASAL SEPTUM SURGERY  2000  . OTHER SURGICAL HISTORY      Muscle & bone Graft/Polio  . polio surgeries      14 polio surgeries   . PROSTATE BIOPSY N/A 09/28/2014   Procedure: PROSTATE ULTRASOUND/BIOPSY;  Surgeon: [name], MD;  [facility];  Service: Urology;  Laterality: N/A;  . PROSTATE BIOPSY N/A 10/25/2015   Procedure: PROSTATE BIOPSY AND ULTRASOUND;  Surgeon: [name], MD;  [facility];  Service: Urology;  Laterality: N/A;  . SAVORY DILATION N/A 03/22/2015   Procedure: SAVORY DILATION;  Surgeon: [name], MD;  [facility];  Service: Endoscopy;  Laterality: N/A;  . SHOULDER ARTHROSCOPY WITH OPEN ROTATOR CUFF REPAIR Bilateral 2013  &  1999   removal spurs and bursectomy  . TRANSURETHRAL INCISION OF BLADDER NECK N/A 10/25/2015   Procedure:  TRANSURETHRAL INCISION OF BLADDER NECK;  Surgeon: [name], MD;  [facility];  Service: Urology;  Laterality: N/A;  . TRANSURETHRAL RESECTION OF PROSTATE N/A 09/28/2014   Procedure: TRANSURETHRAL RESECTION OF THE PROSTATE (TURP);  Surgeon: [name], MD;  [facility];  Service: Urology;  Laterality: N/A;  . URETEROSOPY STONE EXTRACTION  2000    Social History JERMANI LATIMER  reports that he quit smoking about 2 years ago. He quit after 47.00 years of use. He has quit using smokeless tobacco. He reports that he drinks about 3.6 oz of alcohol per week . He reports that he does  not use drugs.  family history includes Alcohol abuse in his sister and sister; Alzheimer's disease in his paternal aunt and paternal uncle; Alzheimer's disease (age of onset: 31) in his father; Cancer in his mother; Diabetes in his maternal aunt and maternal uncle; Heart attack in his father; Heart disease in his father; Mental illness in his sister; Skin cancer in his mother; Stomach cancer in his father; Thyroid disease in his maternal aunt; Tuberculosis in his paternal grandfather and paternal grandmother.  Allergies  Allergen Reactions  . Aspirin Anaphylaxis  . Hornet Venom Anaphylaxis  . Ivp Dye [Iodinated Diagnostic Agents] Anaphylaxis  . Levaquin [Levofloxacin In D5w] Shortness Of Breath and Swelling    In addition: sweating, chest pain, and diarrhea.   . Nsaids Anaphylaxis  . Penicillins Anaphylaxis    Heart stops Has patient had a PCN reaction causing immediate rash, facial/tongue/throat swelling, SOB or lightheadedness with hypotension: yes Has patient had  a PCN reaction causing severe rash involving mucus membranes or skin necrosis: yes Has patient had a PCN reaction that required hospitalization yes Has patient had a PCN reaction occurring within the last 10 years: No If all of the above answers are "NO", then may proceed with Cephalosporin use.   . Tolmetin Anaphylaxis  . Buprenorphine Hcl Nausea And Vomiting    Can take with zofran   . Morphine And Related Nausea And Vomiting    Can take with zofran   . Oxycodone Itching and Rash       PHYSICAL EXAMINATION: Vital signs: BP (!) 110/58 (BP Location: Left Arm, Patient Position: Sitting, Cuff Size: Normal)   Pulse 68   Ht 5' 10.25" (1.784 m) Comment: height measured without shoes  Wt 219 lb 8 oz (99.6 kg)   BMI 31.27 kg/m   Constitutional: Obese, generally well-appearing, no acute distress Psychiatric: alert and oriented x3, cooperative Eyes: extraocular movements intact, anicteric, conjunctiva pink Mouth: oral  pharynx moist, no lesions Neck: supple no lymphadenopathy Cardiovascular: heart regular rate and rhythm, no murmur Lungs: clear to auscultation bilaterally Abdomen: soft, obese, nontender, nondistended, no obvious ascites, no peritoneal signs, normal bowel sounds, no organomegaly Rectal: Deferred, Extremities: no clubbing cyanosis or lower extremity edema bilaterally Skin: no lesions on visible extremities Neuro: No focal deficits. Cranial nerves intact  ASSESSMENT:  #1. Constipation. Functional #2. GERD, complicated by peptic stricture. No recurrent dysphagia. #3. Vague nausea and epigastric discomfort likely related to GERD and/or constipation. Infrequent and not severe #4. Multiple significant medical problems   PLAN:  #1. Reflux precautions with attention to weight loss #2. Instructed to resume MiraLAX. Try 2 scoops daily. Adjust as needed to obtain desired effect #3. Continue PPI. Compliance stressed #4. Surveillance colonoscopy around July 2019 if medically fit #5. Resume general medical care with PCP. Interval GI follow-up as needed  25 minutes was spent face-to-face with the patient. Greater than 50% a time use for counseling regarding his constipation, GERD, and associated complaints of nausea with epigastric discomfort

## 2016-08-26 NOTE — Progress Notes (Deleted)
   Subjective:    Patient ID: [name], male    DOB: 19-Sep-1951, 64 y.o.   MRN: [id number]  HPI Pt returns for f/u of diabetes mellitus: DM type: 2 Dx'ed: 0000000 Complications: retinopathy Therapy: metformin DKA: never Severe hypoglycemia: never Pancreatitis: never Other: he has never taken insulin Interval history:    Review of Systems     Objective:   Physical Exam VITAL SIGNS:  See vs page GENERAL: no distress Pulses: dorsalis pedis intact bilat.   MSK: no deformity of the feet CV: no leg edema Skin:  no ulcer on the feet.  normal color and temp on the feet.  Old healed surgical scars at the right foot.  Neuro: sensation is intact to touch on the feet        Assessment & Plan:

## 2016-08-28 ENCOUNTER — Ambulatory Visit: Payer: Self-pay | Admitting: Endocrinology

## 2016-08-29 ENCOUNTER — Ambulatory Visit: Payer: Self-pay | Admitting: Endocrinology

## 2016-08-29 NOTE — Progress Notes (Signed)
LMOMTCB x 1 

## 2016-09-03 ENCOUNTER — Telehealth: Payer: Self-pay | Admitting: Pulmonary Disease

## 2016-09-03 NOTE — Telephone Encounter (Signed)
Returned pharmacy's call, I was unable to find the original rx request, they agreed to refax the request. Nothing further needed at this time.

## 2016-09-04 NOTE — Telephone Encounter (Signed)
PM  Please advise  I spoke with the pt. And he was stated that he had heard that instead of using a Breo inhaler that he could get it in a nebular solution. So he had his pharmacy send over a request  for Proformist and  Budesonide to replace the Breo. Also he is requesting Ipatropium and Albuterol for his neb machine.   The paper work is located in [employer]

## 2016-09-04 NOTE — Telephone Encounter (Signed)
[place] x 1 for pt I have this form at TP's look at stack.  These meds are not on pt's current med list.  The only neb med listed is Duoneb.  Will need to clarify with pt what neb meds he is taking.

## 2016-09-10 ENCOUNTER — Telehealth: Payer: Self-pay | Admitting: Pulmonary Disease

## 2016-09-10 NOTE — Telephone Encounter (Signed)
Spoke with Margreta Journey at [facility], states that pt is requesting that we substitute his Breo for a nebulizer solution.  A fax rx form is being faxed to our office as well.  RA please advise.  Thanks!

## 2016-09-12 NOTE — Telephone Encounter (Signed)
Complete

## 2016-09-17 ENCOUNTER — Ambulatory Visit (INDEPENDENT_AMBULATORY_CARE_PROVIDER_SITE_OTHER): Payer: Medicare Other | Admitting: Family Medicine

## 2016-09-17 ENCOUNTER — Encounter: Payer: Self-pay | Admitting: Family Medicine

## 2016-09-17 VITALS — BP 122/70 | HR 65 | Temp 97.7°F | Resp 12 | Ht 72.0 in | Wt 222.0 lb

## 2016-09-17 DIAGNOSIS — N529 Male erectile dysfunction, unspecified: Secondary | ICD-10-CM | POA: Diagnosis not present

## 2016-09-17 DIAGNOSIS — R110 Nausea: Secondary | ICD-10-CM

## 2016-09-17 DIAGNOSIS — K219 Gastro-esophageal reflux disease without esophagitis: Secondary | ICD-10-CM | POA: Diagnosis not present

## 2016-09-17 MED ORDER — ONDANSETRON HCL 4 MG PO TABS: 4.0000 mg | ORAL_TABLET | Freq: Two times a day (BID) | ORAL | 2 refills | Status: DC | PRN

## 2016-09-17 MED ORDER — SILDENAFIL CITRATE 50 MG PO TABS: 50.0000 mg | ORAL_TABLET | Freq: Every day | ORAL | 1 refills | Status: DC | PRN

## 2016-09-17 MED ORDER — DEXLANSOPRAZOLE 60 MG PO CPDR: 60.0000 mg | DELAYED_RELEASE_CAPSULE | Freq: Every day | ORAL | 1 refills | Status: DC

## 2016-09-17 NOTE — Progress Notes (Signed)
Pre visit review using our clinic review tool, if applicable. No additional management support is needed unless otherwise documented below in the visit note. 

## 2016-09-17 NOTE — Progress Notes (Signed)
HPI:   [name] is a 65 y.o. male, who is here today to follow on her last office visit, 08/06/2016.  Since his last office visit he has been in the ER, 08/18/2016, diagnosed with COPD exacerbation. He is feeling much better, symptoms have resolved and he is back to his baseline.   GERD: Last OV he was complaining of frequent heartburn, chest and epigastric discomfort. Recommended taking Dexilant daily, which he did for a few days but he is back to taking it every 2 days. Currently asymptomatic.   Denies abdominal pain,  vomiting, changes in bowel habits, blood in stool or melena.   Concerns today: Requesting refill on Zofran 8 mg.  -He is also requesting refill on Zofran, which he has taken for years for intermittent nausea. According to patient he has intermittent nausea, usually exacerbated by defecation.   -History of bipolar disorder, he has not yet re-established with another psychiatrist. States that he was taking "Xanax 10 mg" but he decreased to 0.5 mg, which does not seem to work as well as 10 mg. He denies suicidal thoughts.   ED: C/O 3-4 months, trouble with erections, "all the time." According to patient, his wife would like for him to try something. He denies prior history but reviewing records it seems like this was addressed in April 2015. He denies trying any medication for erectile dysfunction om the past.   + Morning erections, they do not last long, about 99% of the time. He sometimes he can have an erection, weak and does not last long enough. No nocturia or urinary frequency.   Hx of prostate cancer, according to pt, he has prostate Bx annually,he follows with urologists and next appt 09/28/16.    Review of Systems  Constitutional: Negative for activity change, appetite change, fatigue, fever and unexpected weight change.  HENT: Negative for nosebleeds, postnasal drip, sore throat, trouble swallowing and voice change.     Respiratory: Negative for cough, shortness of breath and wheezing.   Cardiovascular: Negative for chest pain, palpitations and leg swelling.  Gastrointestinal: Negative for abdominal pain and vomiting.  Genitourinary: Negative for decreased urine volume, difficulty urinating, dysuria and hematuria.  Neurological: Negative for dizziness, seizures, weakness, numbness and headaches.  Psychiatric/Behavioral: Negative for confusion, hallucinations and suicidal ideas.      Current Outpatient Prescriptions on File Prior to Visit  Medication Sig Dispense Refill  . acetaminophen (TYLENOL) 500 MG tablet Take 500 mg by mouth every 6 (six) hours as needed for headache (pain).    Marland Kitchen albuterol (PROVENTIL HFA;VENTOLIN HFA) 108 (90 Base) MCG/ACT inhaler Inhale 2 puffs into the lungs every 6 (six) hours as needed for wheezing or shortness of breath. 3 Inhaler 3  . ALPRAZolam (XANAX) 1 MG tablet Take 0.5 tablets (0.5 mg total) by mouth 3 (three) times daily as needed for anxiety. (Patient taking differently: Take 1 mg by mouth 2 (two) times daily as needed for anxiety. ) 90 tablet 0  . atorvastatin (LIPITOR) 40 MG tablet One tab q hs (Patient taking differently: Take 40 mg by mouth at bedtime. ) 90 tablet 1  . Blood Glucose Monitoring Suppl (ACCU-CHEK AVIVA) device Use to check blood sugars every morning fasting, 2 hours after largest meal and as needed with low blood sugar symptoms 1 each 0  . doxycycline (VIBRA-TABS) 100 MG tablet Take 1 tablet (100 mg total) by mouth 2 (two) times daily. 14 tablet 0  . EPINEPHrine 0.3 mg/0.3 mL IJ  SOAJ injection Inject 0.3 mLs (0.3 mg total) into the muscle once. (Patient taking differently: Inject 0.3 mg into the muscle once as needed (severe allergic reactions). ) 1 Device 1  . fluticasone (FLONASE) 50 MCG/ACT nasal spray Place 2 sprays into both nostrils daily. (Patient taking differently: Place 2 sprays into both nostrils daily as needed for allergies or rhinitis  (congestion). ) 16 g 6  . fluticasone furoate-vilanterol (BREO ELLIPTA) 100-25 MCG/INH AEPB INHALE 1 PUFF BY MOUTH EVERY MORNING (Patient taking differently: Inhale 1 puff into the lungs daily. INHALE 1 PUFF BY MOUTH EVERY MORNIN) 180 each 3  . HYDROcodone-acetaminophen (NORCO/VICODIN) 5-325 MG tablet Take 1 tablet by mouth every 6 (six) hours as needed for moderate pain.    Marland Kitchen ipratropium-albuterol (DUONEB) 0.5-2.5 (3) MG/3ML SOLN Take 3 mLs by nebulization every 4 (four) hours as needed (shortness of breath and wheezing).    . metFORMIN (GLUCOPHAGE-XR) 500 MG 24 hr tablet Take 1 tablet (500 mg total) by mouth daily with breakfast. 90 tablet 3  . metoprolol succinate (TOPROL-XL) 25 MG 24 hr tablet Take 1 tablet (25 mg total) by mouth daily. 30 tablet 0  . montelukast (SINGULAIR) 10 MG tablet TAKE 1 TABLET (10 MG TOTAL) BY MOUTH AT BEDTIME. (Patient taking differently: Take 10 mg by mouth at bedtime as needed (seasonal allergies). ) 90 tablet 1  . nitroGLYCERIN (NITROSTAT) 0.4 MG SL tablet Place 1 tablet (0.4 mg total) under the tongue every 5 (five) minutes as needed for chest pain. 30 tablet 3  . OXYGEN Inhale 2 L into the lungs at bedtime. Reported on 11/25/2015    . oxymetazoline (AFRIN) 0.05 % nasal spray Place 1 spray into both nostrils continuous.     . sodium phosphate (FLEET) 7-19 GM/118ML ENEM Place 1 enema rectally as needed for severe constipation.    . budesonide (PULMICORT) 0.25 MG/2ML nebulizer solution Take 2 mLs (0.25 mg total) by nebulization 2 (two) times daily. 120 mL 6  . formoterol (PERFOROMIST) 20 MCG/2ML nebulizer solution Take 2 mLs (20 mcg total) by nebulization 2 (two) times daily. 120 mL 6   No current facility-administered medications on file prior to visit.      Past Medical History:  Diagnosis Date  . Anxiety   . Anxiety disorder   . Arthritis    BILATERAL SHOULDERS, ELBOWS AND HANDS AND LEFT HIP AND KNEES--HX CORTISONE SHOTS IN SHOULDERS, ELBOWS, HIP AND KNEES  .  Basal cell carcinoma of back 04/30/2016   Dermatologist- Dr Nevada Crane;   MOHS sx- Dr Levada Dy   . Bladder outlet obstruction   . Chronic idiopathic constipation 05/08/2015  . Complication of anesthesia    DIFFICULT WAKING   . COPD (chronic obstructive pulmonary disease) Gold C Frequent exacerbations 01/13/2014   [name] 07/08/14: FeV1 51% FeV1/FVC 66% FVC 59% 10/5/2015ONO RA was normal  10/13/2014  ONO on RA NORMAL   . COPD, frequent exacerbations ([place])    pulmologist-  dr Joya Gaskins--  Girtha Rm Stage C.04-25-15 recent COPD exacerbation-much improved now, after tx. in ER [facility].  Marland Kitchen Depression   . Diabetes mellitus without complication ([place])    BODERLINE - DIET CONTROL  . Dysrhythmia    PVC'S  . Family history of adverse reaction to anesthesia    father would wake up with agitation   . Former smoker 01/13/2014  . Gastroesophageal reflux disease without esophagitis   . GERD (gastroesophageal reflux disease)   . Heavy alcohol use 04/30/2016  . History of chronic bronchitis   .  History of oxygen administration    oxygen use 2 l/m nasally at bedtime and exertional occasions  . History of rheumatic fever   . History of TB (tuberculosis)    1984--  hospitalized for 4 month treatment  . History of urinary retention   . Hx of multiple concussions    x 2 per patient   . Hypertension   . Melanoma ([place])   . Nocturnal oxygen desaturation    USES O2 NIGHTLY  . OSA (obstructive sleep apnea) 01/13/2014  . Pneumonia    hx of   . PONV (postoperative nausea and vomiting)   . Post-polio syndrome    polio at age 26--PT WAS IN IRON LUNG; PT WAS IN W/C UNTIL AGE 65; STILL HAS WEAKNESS RIGHT SIDE  . Prostate cancer ([place])   . Prostate cancer ([place])   . Schizophrenia ([place])   . Shortness of breath dyspnea    RIGHT HEMIDIAPHRAGM ELEVATION - POST POLIO SYNDROME  . Tuberculosis    Hosp 4 months rx , left early    Allergies  Allergen Reactions  . Aspirin Anaphylaxis  . [place] Venom Anaphylaxis  . Ivp Dye [Iodinated  Diagnostic Agents] Anaphylaxis  . Levaquin [Levofloxacin In D5w] Shortness Of Breath and Swelling    In addition: sweating, chest pain, and diarrhea.   . Nsaids Anaphylaxis  . Penicillins Anaphylaxis    Heart stops Has patient had a PCN reaction causing immediate rash, facial/tongue/throat swelling, SOB or lightheadedness with hypotension: yes Has patient had a PCN reaction causing severe rash involving mucus membranes or skin necrosis: yes Has patient had a PCN reaction that required hospitalization yes Has patient had a PCN reaction occurring within the last 10 years: No If all of the above answers are "NO", then may proceed with Cephalosporin use.   . Tolmetin Anaphylaxis  . Buprenorphine Hcl Nausea And Vomiting    Can take with zofran   . Morphine And Related Nausea And Vomiting    Can take with zofran   . Oxycodone Itching and Rash    Social History   Social History  . Marital status: Married    Spouse name: N/A  . Number of children: N/A  . Years of education: N/A   Occupational History  . Retired    Social History Main Topics  . Smoking status: Former Smoker    Years: 47.00    Quit date: 08/25/2013  . Smokeless tobacco: Former Systems developer  . Alcohol use 3.6 oz/week    6 Cans of beer per week     Comment: daily  . Drug use: No     Comment: hx of marijuana use   . Sexual activity: Yes    Birth control/ protection: None   Other Topics Concern  . None   Social History Narrative  . None    Vitals:   09/17/16 0957  BP: 122/70  Pulse: 65  Resp: 12  Temp: 97.7 F (36.5 C)   O2 sat 97% at RA.   Body mass index is 30.11 kg/m.     Physical Exam  Nursing note and vitals reviewed. Constitutional: He is oriented to person, place, and time. He appears well-developed. No distress.  HENT:  Head: Atraumatic.  Mouth/Throat: Oropharynx is clear and moist and mucous membranes are normal.  Eyes: Conjunctivae and EOM are normal. Pupils are equal, round, and reactive  to light.  Cardiovascular: Normal rate and regular rhythm.   No murmur heard. Pulses:      Dorsalis pedis  pulses are 2+ on the right side, and 2+ on the left side.  Respiratory: Effort normal and breath sounds normal. No respiratory distress.  GI: Soft. He exhibits no mass. There is no hepatomegaly. There is no tenderness. A hernia (Small umbilical hernia) is present.  Musculoskeletal: He exhibits no edema or tenderness.  Neurological: He is alert and oriented to person, place, and time. He has normal strength.  Skin: Skin is warm. No erythema.  Psychiatric: He has a normal mood and affect. His speech is rapid and/or pressured. Cognition and memory are normal.  Well groomed, good eye contact.      ASSESSMENT AND PLAN:     Hussan was seen today for follow-up.  Diagnoses and all orders for this visit:  Gastroesophageal reflux disease without esophagitis  Symptoms improved and currently asymptomatic with Dexilant q 2 days. No changes today. GERD precautions reviewed.  -     dexlansoprazole (DEXILANT) 60 MG capsule; Take 1 capsule (60 mg total) by mouth daily.  Nausea without vomiting  Reported as chronic. Hx of GERD and dyspepsia, following with GI, last visit 08/21/16. EGD 10/2015 GERD with peptic stricture, s/p esophageal dilation.  Zofran refilled but lower dose, 4 mg bid as needed.  -     ondansetron (ZOFRAN) 4 MG tablet; Take 1 tablet (4 mg total) by mouth every 12 (twelve) hours as needed for nausea or vomiting.  Erectile dysfunction, unspecified erectile dysfunction type  We discussed possible causes and treatment options as well as side effects. He would like to try Viagra. We also discussed interaction risk if taking nitro, he voices understanding. He will continue following with urologists, will keep next appt.  -     sildenafil (VIAGRA) 50 MG tablet; Take 1 tablet (50 mg total) by mouth daily as needed for erectile dysfunction.      -[name]  was advised to return sooner than planned today if new concerns arise.       Courtnay Petrilla G. Martinique, MD  [facility] [place] office.

## 2016-09-17 NOTE — Patient Instructions (Addendum)
A few things to remember from today's visit:   Gastroesophageal reflux disease without esophagitis - Plan: dexlansoprazole (DEXILANT) 60 MG capsule  No nitro with viagra, for at least 24 hours. Follow with urologists in 09/2016.    Avoid foods that make your symptoms worse, for example coffee, chocolate,pepermeint,alcohol, and greasy food. Raising the head of your bed about 6 inches may help with nocturnal symptoms.  Avoid tobacco use. Weight loss (if you are overweight). Avoid lying down for 3 hours after eating.  Instead 3 large meals daily try small and more frequent meals during the day.  Some medications we recommend for acid reflux treatment (proton pump inhibitors) can cause some problems in the long term: increase risk of osteoporosis, vitamin deficiencies,pneumonia, and more recently discovered that it can increase the risk of chronic kidney disease and might increase risk of dementia.  You should be evaluated immediately if bloody vomiting, bloody stools, black stools (like tar), difficulty swallowing, food gets stuck on the way down or choking when eating. Abnormal weight loss or severe abdominal pain.     Please establish with psychiatrists.

## 2016-09-18 MED ORDER — BUDESONIDE 0.25 MG/2ML IN SUSP: 0.2500 mg | Freq: Two times a day (BID) | RESPIRATORY_TRACT | 6 refills | Status: DC

## 2016-09-18 MED ORDER — FORMOTEROL FUMARATE 20 MCG/2ML IN NEBU
20.0000 ug | INHALATION_SOLUTION | Freq: Two times a day (BID) | RESPIRATORY_TRACT | 6 refills | Status: DC
Start: 2016-09-18 — End: 2016-10-24

## 2016-09-18 NOTE — Telephone Encounter (Signed)
TP  In RA absent would you be able to advise [name]   This pt. is requesting that we substitute his Breo for a nebulizer solution. States that he thinks that this would be better for him.

## 2016-09-18 NOTE — Telephone Encounter (Signed)
[name] with [facility] is calling to check on status.  CB is [phone].  They still have yet to receive anything and the original request was on 08/28/2016.

## 2016-09-18 NOTE — Telephone Encounter (Signed)
Called and spoke with pt and he is aware of meds sent to [facility].  Nothing further is needed

## 2016-09-18 NOTE — Telephone Encounter (Signed)
Budesonide and Perforomist neb Twice daily  Will work  He would stop BREO and begin above neb.  Needs to send thru DME

## 2016-09-18 NOTE — Progress Notes (Signed)
LMOM TCB x3.  Will send results letter to patient.

## 2016-09-19 ENCOUNTER — Telehealth: Payer: Self-pay | Admitting: Adult Health

## 2016-09-20 NOTE — Telephone Encounter (Signed)
ATC-unable to reach anyone at [facility] at this time.

## 2016-09-21 NOTE — Telephone Encounter (Signed)
Spoke with [name] at [facility]. Rx for Budesonide and Performist already faxed to them.  They need form returned for approval for [employer] and supplies.  Form faxed along with most recent ov note and telephone note discussing neb meds. Nothing further needed.

## 2016-09-25 ENCOUNTER — Telehealth: Payer: Self-pay | Admitting: Family Medicine

## 2016-09-25 DIAGNOSIS — F419 Anxiety disorder, unspecified: Secondary | ICD-10-CM

## 2016-09-25 NOTE — Telephone Encounter (Signed)
Rx refused

## 2016-09-25 NOTE — Telephone Encounter (Signed)
Refill okay?  

## 2016-09-25 NOTE — Telephone Encounter (Signed)
[name] from [facility] stating that the patient is requesting a RX refill on ALPRAZolam [name]) 1 MG tablet  Contact Info: [name], [facility] [phone]

## 2016-09-25 NOTE — Telephone Encounter (Signed)
He is supposed to follow with his psychiatrists, We discussed this last OV, he wanted a new psychiatrists because did not agree with the one he has seen. Rx is not authorized.  Request may be from pharmacy because [name] did not ask me for refills last OV.  Thanks, BJ

## 2016-09-26 ENCOUNTER — Telehealth: Payer: Self-pay | Admitting: Pulmonary Disease

## 2016-09-26 NOTE — Telephone Encounter (Signed)
Pt stated that his insurance will not cover the proair after the first of the year. They will cover ventolin.  Pt has appt with RA on 12/19 and he is aware to mention this to him at that time.

## 2016-09-26 NOTE — Telephone Encounter (Signed)
atc [facility], received after hours service.  wcb tomorrow (12/14)

## 2016-09-27 ENCOUNTER — Ambulatory Visit: Payer: Self-pay | Admitting: Adult Health

## 2016-09-27 NOTE — Telephone Encounter (Signed)
Spoke with representative from [place]. She was very demanding about this form that needs to be filled out for the pt. Advised her that we do have this form, it was sent to TP not RA. Will route message to [facility] to ensure that this is taken care of ASAP.

## 2016-09-28 ENCOUNTER — Telehealth: Payer: Self-pay | Admitting: Pulmonary Disease

## 2016-09-28 NOTE — Telephone Encounter (Signed)
Called [facility] and they stated that the pt has been inactive in their system since 2012---she did say that pt has been active with the one in [place] and to call [phone].    I have called this number and they also stated that the pt has not had anything filled by them since 2012.  Will close this phone note and wait for a call back from [facility] and will need a correct number and a person and extention to contact.

## 2016-09-28 NOTE — Telephone Encounter (Signed)
Notes Recorded by Marshell Garfinkel, MD on 08/27/2016 at 10:42 AM EST Please let the patient know that the CXR does not show any pneumonia,    Called and spoke to pt. Pt states he is returning our call. Informed him of the results and recs per PM. Pt verbalized understanding and denied any further questions or concerns at this time.

## 2016-10-01 ENCOUNTER — Telehealth: Payer: Self-pay | Admitting: Pulmonary Disease

## 2016-10-01 NOTE — Telephone Encounter (Signed)
Spoke with someone from [facility] and they stated that a rx was faxed to them today but it did have a sig from the provider. I did inform them I am unsure of where the fax came from because I do not see any documentation that we faxed anything nor a medication being ordered on this pt. From today. She stated she would fax over what they received because it was some writing on it. Will await fax.

## 2016-10-02 ENCOUNTER — Encounter: Payer: Self-pay | Admitting: Pulmonary Disease

## 2016-10-02 ENCOUNTER — Ambulatory Visit (INDEPENDENT_AMBULATORY_CARE_PROVIDER_SITE_OTHER): Payer: Medicare Other | Admitting: Pulmonary Disease

## 2016-10-02 DIAGNOSIS — G4733 Obstructive sleep apnea (adult) (pediatric): Secondary | ICD-10-CM

## 2016-10-02 DIAGNOSIS — J441 Chronic obstructive pulmonary disease with (acute) exacerbation: Secondary | ICD-10-CM

## 2016-10-02 MED ORDER — PREDNISONE 10 MG PO TABS
ORAL_TABLET | ORAL | 0 refills | Status: DC
Start: 2016-10-02 — End: 2016-10-29

## 2016-10-02 NOTE — Patient Instructions (Addendum)
Prednisone 10 mg tabs  Take 2 tabs daily with food x 10ds, then 1 tab daily with food x 2 weeks   Prescription will be sent to DME for Pulmicort and Perforomist nebs twice daily -- take this instead of BREO - Call us if you don't receive these medications by the  end of the week  Continue using DuoNeb's for rescue as needed every 6 hours

## 2016-10-02 NOTE — Assessment & Plan Note (Signed)
   Prednisone 10 mg tabs  Take 2 tabs daily with food x 10ds, then 1 tab daily with food x 2 weeks We'll treat as exacerbation- not sure what is making things worse No obvious environmental exposure, he has quit smoking in 2014 Once he stabilizes we'll repeat pulmonary function testing-if he does not improve may have to consider other causes such as postpolio syndrome  Prescription will be sent to DME for Pulmicort and Perforomist nebs twice daily -- take this instead of BREO - Call us if you don't receive these medications by the  end of the week  Continue using DuoNeb's for rescue as needed every 6 hours

## 2016-10-02 NOTE — Telephone Encounter (Signed)
Spoke with Crystal at [place] and advised that form was faxed. Nothing further needed.

## 2016-10-02 NOTE — Assessment & Plan Note (Signed)
Does not want CPAP therapy at this time

## 2016-10-02 NOTE — Progress Notes (Signed)
Subjective:    Patient ID: [name], male    DOB: 1951/06/16, 65 y.o.   MRN: [id number]  HPI  65 yo former smoker quit 2014 with COPD  And mild  OSA .  Post polio syndrome as a child   10/02/2016  Chief Complaint  Patient presents with  . Follow-up    4 month follow up. SOB has gotten worse with the colder weather.    His breathing remains worse in the morning-takes him 2-3 hours to get going He feels like to not working and really was not helping him manage He had an acute office visit in 08/2016 and was treated with prednisone for a COPD flare-this helped him while he was on it and then things got worse again, prednisone increased his appetite tremendously.  On review of Epic, I see that he was prescribed [place] and Perforomist but for some reason he never received this through DME - I  noted a lot of back and forth notes between DME in the office-   He had a sleep study in 11/2015 and which showed mild OSA, he did not want CPAP therapy  Significant tests/ events   [place] 07/08/14: FeV1 51% FeV1/FVC 66% FVC 59% 10/13/2014  ONO on RA NORMAL  HST 11/2015 AHI 8/h  Echo 06/2016 nml LV fn   Chest x-ray February 2017 showed stable chronic changes.    Past Medical History:  Diagnosis Date  . Anxiety   . Anxiety disorder   . Arthritis    BILATERAL SHOULDERS, ELBOWS AND HANDS AND LEFT HIP AND KNEES--HX CORTISONE SHOTS IN SHOULDERS, ELBOWS, HIP AND KNEES  . Basal cell carcinoma of back 04/30/2016   Dermatologist- Dr Nevada Crane;   MOHS sx- Dr Levada Dy   . Bladder outlet obstruction   . Chronic idiopathic constipation 05/08/2015  . Complication of anesthesia    DIFFICULT WAKING   . COPD (chronic obstructive pulmonary disease) Gold C Frequent exacerbations 01/13/2014   [name] 07/08/14: FeV1 51% FeV1/FVC 66% FVC 59% 10/5/2015ONO RA was normal  10/13/2014  ONO on RA NORMAL   . COPD, frequent exacerbations ([place])    pulmologist-  dr Joya Gaskins--  Girtha Rm Stage C.04-25-15 recent COPD  exacerbation-much improved now, after tx. in ER [facility].  Marland Kitchen Depression   . Diabetes mellitus without complication ([place])    BODERLINE - DIET CONTROL  . Dysrhythmia    PVC'S  . Family history of adverse reaction to anesthesia    father would wake up with agitation   . Former smoker 01/13/2014  . Gastroesophageal reflux disease without esophagitis   . GERD (gastroesophageal reflux disease)   . Heavy alcohol use 04/30/2016  . History of chronic bronchitis   . History of oxygen administration    oxygen use 2 l/m nasally at bedtime and exertional occasions  . History of rheumatic fever   . History of TB (tuberculosis)    1984--  hospitalized for 4 month treatment  . History of urinary retention   . Hx of multiple concussions    x 2 per patient   . Hypertension   . Melanoma ([place])   . Nocturnal oxygen desaturation    USES O2 NIGHTLY  . OSA (obstructive sleep apnea) 01/13/2014  . Pneumonia    hx of   . PONV (postoperative nausea and vomiting)   . Post-polio syndrome    polio at age 47--PT WAS IN IRON LUNG; PT WAS IN W/C UNTIL AGE 63; STILL HAS WEAKNESS RIGHT SIDE  .  Prostate cancer ([place])   . Prostate cancer ([place])   . Schizophrenia ([place])   . Shortness of breath dyspnea    RIGHT HEMIDIAPHRAGM ELEVATION - POST POLIO SYNDROME  . Tuberculosis    Hosp 4 months rx , left early       Review of Systems neg for any significant sore throat, dysphagia, itching, sneezing, nasal congestion or excess/ purulent secretions, fever, chills, sweats, unintended wt loss, pleuritic or exertional cp, hempoptysis, orthopnea pnd or change in chronic leg swelling.   Also denies presyncope, palpitations, heartburn, abdominal pain, nausea, vomiting, diarrhea or change in bowel or urinary habits, dysuria,hematuria, rash, arthralgias, visual complaints, headache, numbness weakness or ataxia.     Objective:   Physical Exam   Gen. Pleasant, well-nourished, in no distress, normal affect ENT - no lesions, no  post nasal drip Neck: No JVD, no thyromegaly, no carotid bruits Lungs: no use of accessory muscles, no dullness to percussion, clear without rales or rhonchi  Cardiovascular: Rhythm regular, heart sounds  normal, no murmurs or gallops, no peripheral edema Abdomen: soft and non-tender, no hepatosplenomegaly, BS normal. Musculoskeletal: No deformities, no cyanosis or clubbing Neuro:  alert, non focal        Assessment & Plan:

## 2016-10-02 NOTE — Addendum Note (Signed)
Addended by: Benson Setting L on: 10/02/2016 09:49 AM   Modules accepted: Orders

## 2016-10-04 ENCOUNTER — Telehealth: Payer: Self-pay | Admitting: Pulmonary Disease

## 2016-10-04 NOTE — Telephone Encounter (Signed)
Spoke with someone from [facility] and informed them that on Dec. 19 the form was faxed and Langley Gauss spoke with [name] and informed her of this. Now they are saying that we were not suppose to use the fax number on the form and to verify the fax number but I am unable to find the original paper work will, forward to the nurse that worked on this and sent this to see if she still has the paper work   October 02, 2016  Jonathon Beckers, RN      3:43 PM  Note    Spoke with [name] at [place] and advised that form was faxed. Nothing further

## 2016-10-05 NOTE — Telephone Encounter (Signed)
Patient stated if he doesn't hear from our office by next week  he is finding a new doctor and he will be suing the company. Patient stated he did not receive his medication through [facility]. Patient is upset and didn't give me a chance to ask questions. Patient request for someone to call him asap.

## 2016-10-05 NOTE — Telephone Encounter (Signed)
lmom x 1  Attempted to contact [facility] and they will be out of the office until Monday.  I did call and lm to make the pt aware of this.

## 2016-10-09 NOTE — Telephone Encounter (Signed)
atc pt, lmtcb. Called [facility], states that a medicare form needs to be filled out for pt's pulmicort.  I asked if I could escribe a new rx to expedite the process as the pt is very upset that he has not received this medication.  I was informed that this form must be signed and faxed back before they can dispense the medication.    Form being refaxed to office.  Will await form.

## 2016-10-11 ENCOUNTER — Telehealth: Payer: Self-pay | Admitting: Pulmonary Disease

## 2016-10-11 NOTE — Telephone Encounter (Signed)
Forms have be re faxed with date. I have spoke with [facility] and made them aware of this. A successful fax confirmation has been received. Nothing further needed.

## 2016-10-11 NOTE — Telephone Encounter (Signed)
Spoke with Brandy at [facility] and informed that we have not received a new form for pt.  I informed her that we have a copy of old form (media) that was signed and faxed back on 09/03/16.  She stated this will work and asked that the form be faxed to [phone] Attn: Brandy.  Form faxed. LMOM for pt that form was faxed for neb meds.

## 2016-10-11 NOTE — Telephone Encounter (Signed)
[name]  have you received the form from [facility]? Is this in RA look at to be signed by him?  Please advise. thanks

## 2016-10-18 ENCOUNTER — Telehealth: Payer: Self-pay | Admitting: Pulmonary Disease

## 2016-10-18 NOTE — Telephone Encounter (Signed)
Suggest try just using the budesonide for now, through the weekend.  Next week, suggest try again with Perforomist, but only use 1/2 ampule. If the same feeling happens again, then stay off it and call Dr Elsworth Soho for advice and follow-up  Ok to use the albuterol rescue inhaler as before

## 2016-10-18 NOTE — Telephone Encounter (Signed)
Spoke with patient, aware that Rx forms were faxed and patient states that he received his medication.  Pt has concerns with the medication he is taking - see telephone note 10/18/16 as this message is already closed.

## 2016-10-18 NOTE — Telephone Encounter (Signed)
Attempted to contact pt. No answer, no option to leave a message. Will try back.  

## 2016-10-18 NOTE — Telephone Encounter (Signed)
Pt has concerns with the medication he is taking. Pt states that he takes 2 nebs Perforomist and Budesonide. Pt states that he took his first dose of the Perforomist nebulizer and felt very heavy in his chest. Pt reported having difficulty breathing after taking the neb. Heaviness started a few minutes into the nebulizer treatment and lasted the entire treatment and for about 12 minutes after the treatment was done. Pt states that it felt like something was sitting on his chest. Pt states that he considered taking a Nitroglicerine tablet. Pt states that when he took his second part of the treatment which was Budesonide all the heaviness and discomfort subsided. Pt wants to know if this "heaviness" is normal or if this was a reaction he might have been having to the medication. Please advise Dr Annamaria Boots as Dr Elsworth Soho is not available. Thanks.   Allergies as of 10/18/2016      Reactions   Aspirin Anaphylaxis   Hornet Venom Anaphylaxis   Ivp Dye [iodinated Diagnostic Agents] Anaphylaxis   Levaquin [levofloxacin In D5w] Shortness Of Breath, Swelling   In addition: sweating, chest pain, and diarrhea.    Nsaids Anaphylaxis   Penicillins Anaphylaxis   Heart stops Has patient had a PCN reaction causing immediate rash, facial/tongue/throat swelling, SOB or lightheadedness with hypotension: yes Has patient had a PCN reaction causing severe rash involving mucus membranes or skin necrosis: yes Has patient had a PCN reaction that required hospitalization yes Has patient had a PCN reaction occurring within the last 10 years: No If all of the above answers are "NO", then may proceed with Cephalosporin use.   Tolmetin Anaphylaxis   Buprenorphine Hcl Nausea And Vomiting   Can take with zofran    Morphine And Related Nausea And Vomiting   Can take with zofran    Oxycodone Itching, Rash      Medication List       Accurate as of 10/18/16  4:44 PM. Always use your most recent med list.          ACCU-CHEK AVIVA  device Use to check blood sugars every morning fasting, 2 hours after largest meal and as needed with low blood sugar symptoms   acetaminophen 500 MG tablet Commonly known as:  TYLENOL Take 500 mg by mouth every 6 (six) hours as needed for headache (pain).   albuterol 108 (90 Base) MCG/ACT inhaler Commonly known as:  PROVENTIL HFA;VENTOLIN HFA Inhale 2 puffs into the lungs every 6 (six) hours as needed for wheezing or shortness of breath.   ALPRAZolam 1 MG tablet Commonly known as:  XANAX Take 0.5 tablets (0.5 mg total) by mouth 3 (three) times daily as needed for anxiety.   atorvastatin 40 MG tablet Commonly known as:  LIPITOR One tab q hs   budesonide 0.25 MG/2ML nebulizer solution Commonly known as:  PULMICORT Take 2 mLs (0.25 mg total) by nebulization 2 (two) times daily.   dexlansoprazole 60 MG capsule Commonly known as:  DEXILANT Take 1 capsule (60 mg total) by mouth daily.   doxycycline 100 MG tablet Commonly known as:  VIBRA-TABS Take 1 tablet (100 mg total) by mouth 2 (two) times daily.   EPINEPHrine 0.3 mg/0.3 mL Soaj injection Commonly known as:  EPI-PEN Inject 0.3 mLs (0.3 mg total) into the muscle once.   fluticasone 50 MCG/ACT nasal spray Commonly known as:  FLONASE Place 2 sprays into both nostrils daily.   fluticasone furoate-vilanterol 100-25 MCG/INH Aepb Commonly known as:  BREO ELLIPTA INHALE  1 PUFF BY MOUTH EVERY MORNING   formoterol 20 MCG/2ML nebulizer solution Commonly known as:  PERFOROMIST Take 2 mLs (20 mcg total) by nebulization 2 (two) times daily.   HYDROcodone-acetaminophen 5-325 MG tablet Commonly known as:  NORCO/VICODIN Take 1 tablet by mouth every 6 (six) hours as needed for moderate pain.   ipratropium-albuterol 0.5-2.5 (3) MG/3ML Soln Commonly known as:  DUONEB Take 3 mLs by nebulization every 4 (four) hours as needed (shortness of breath and wheezing).   metoprolol succinate 25 MG 24 hr tablet Commonly known as:   TOPROL-XL Take 1 tablet (25 mg total) by mouth daily.   montelukast 10 MG tablet Commonly known as:  SINGULAIR TAKE 1 TABLET (10 MG TOTAL) BY MOUTH AT BEDTIME.   nitroGLYCERIN 0.4 MG SL tablet Commonly known as:  NITROSTAT Place 1 tablet (0.4 mg total) under the tongue every 5 (five) minutes as needed for chest pain.   ondansetron 4 MG tablet Commonly known as:  ZOFRAN Take 1 tablet (4 mg total) by mouth every 12 (twelve) hours as needed for nausea or vomiting.   OXYGEN Inhale 2 L into the lungs at bedtime. Reported on 11/25/2015   oxymetazoline 0.05 % nasal spray Commonly known as:  AFRIN Place 1 spray into both nostrils continuous.   predniSONE 10 MG tablet Commonly known as:  DELTASONE Take 2 tabs daily with food for 10 days, then 1 tab daily with food for 2 weeks   sodium phosphate 7-19 GM/118ML Enem Place 1 enema rectally as needed for severe constipation.      Allergies  Allergen Reactions  . Aspirin Anaphylaxis  . Hornet Venom Anaphylaxis  . Ivp Dye [Iodinated Diagnostic Agents] Anaphylaxis  . Levaquin [Levofloxacin In D5w] Shortness Of Breath and Swelling    In addition: sweating, chest pain, and diarrhea.   . Nsaids Anaphylaxis  . Penicillins Anaphylaxis    Heart stops Has patient had a PCN reaction causing immediate rash, facial/tongue/throat swelling, SOB or lightheadedness with hypotension: yes Has patient had a PCN reaction causing severe rash involving mucus membranes or skin necrosis: yes Has patient had a PCN reaction that required hospitalization yes Has patient had a PCN reaction occurring within the last 10 years: No If all of the above answers are "NO", then may proceed with Cephalosporin use.   . Tolmetin Anaphylaxis  . Buprenorphine Hcl Nausea And Vomiting    Can take with zofran   . Morphine And Related Nausea And Vomiting    Can take with zofran   . Oxycodone Itching and Rash

## 2016-10-19 NOTE — Telephone Encounter (Signed)
Patient stated he started wheezing after taking Perforomist. Patient also had to take the rescue inhaler to stop the wheezing.

## 2016-10-19 NOTE — Telephone Encounter (Signed)
Called and spoke with pt. And informed him of CY recc. Since we were not able to  Reach him yesterday. He agreed to try CY plan. Nothing further is needed at this time.

## 2016-10-24 ENCOUNTER — Telehealth: Payer: Self-pay | Admitting: Pulmonary Disease

## 2016-10-24 ENCOUNTER — Other Ambulatory Visit: Payer: Self-pay | Admitting: Family Medicine

## 2016-10-24 MED ORDER — ARFORMOTEROL TARTRATE 15 MCG/2ML IN NEBU: 15.0000 ug | INHALATION_SOLUTION | Freq: Two times a day (BID) | RESPIRATORY_TRACT | 6 refills | Status: DC

## 2016-10-24 NOTE — Telephone Encounter (Signed)
Spoke with pt, states that he followed CY's recs and has been using 1/2 of ampule of Perforomist (see 10/18/16 phone note), and is still having wheezing and chest tightness after using Perforomist.  Pt is using budesonide with no problems, as well as his rescue inhaler.  Pt requesting further recs.  RA please advise.  Thanks.

## 2016-10-24 NOTE — Telephone Encounter (Signed)
° ° ° ° °  Pt request refill of the following:  ALPRAZolam [name]) 1 MG tablet   Phamacy:  [facility]

## 2016-10-24 NOTE — Telephone Encounter (Signed)
Spoke with pt, aware of recs.  rx sent to preferred pharmacy.  Nothing further needed.  

## 2016-10-24 NOTE — Telephone Encounter (Signed)
Discontinue Perforomist Trial of brovana nebs twice daily instead

## 2016-10-25 ENCOUNTER — Encounter (HOSPITAL_COMMUNITY): Payer: Self-pay

## 2016-10-25 ENCOUNTER — Telehealth: Payer: Self-pay | Admitting: Pulmonary Disease

## 2016-10-25 NOTE — Telephone Encounter (Signed)
[facility] calling about the Ipatopuim and did we receive a fax on this [phone] [facility]

## 2016-10-25 NOTE — Telephone Encounter (Signed)
Please refer to phone call 09/25/16. He is supposed to follow with psychiatrists. Please call pharmacy and clarify that I am not the prescribed for Xanax.  Thanks, BJ

## 2016-10-25 NOTE — Telephone Encounter (Signed)
We have not received anything on this patient >> I do not see anything in Dr Bari Mantis cubby. Please advise Dr Elsworth Soho if you have anything or if you took anything with you from your cubby when you left today. Thanks.

## 2016-10-25 NOTE — Telephone Encounter (Signed)
Spoke to pt, told him Dr.Jordan did not prescribe Xanax, it was prescribed by Debbrah Alar need to contact that provider. Pt verbalized understanding and said can I not come in to see Dr. Martinique and have her prescribe it. Told pt no, Dr. Martinique said you were suppose to follow up with Psychiatry and have them prescribe she will not prescribe. Pt verbalized understanding.

## 2016-10-26 ENCOUNTER — Telehealth: Payer: Self-pay | Admitting: Family Medicine

## 2016-10-26 NOTE — Telephone Encounter (Signed)
Spoke with pt. He is aware that have started the PA on Brovana. While on the phone he told me that he is going to the ED because he can't breath.

## 2016-10-26 NOTE — Telephone Encounter (Signed)
Patient is requesting a transfer of care from Betty Martinique to Dr. Nani Ravens due to differing of opinion and he also states there is language barrier. Please advise. Please advise  Phone: [phone]

## 2016-10-26 NOTE — Telephone Encounter (Signed)
[phone] pt calling back

## 2016-10-26 NOTE — Telephone Encounter (Signed)
This phone message is discussing two medications- pt is calling about brovana that was called in on 1/10. [place] is calling about patient's duoneb.   Called [facility] (where brovana was sent to), pharmacy states that brovana rx was received but is requiring a PA.  Pharmacy tech states she could not refax forms to our office, but we must call [phone] to initiate PA.  Pt ID: [id number]  Called to initiate PA, this is currently being sent to pharmacist for review Ref PA [id number]  lmtcb X1 for pt to make aware of PA for [place].   Louann, a DWO is needing to be filled out for duoneb.  This is being faxed to office for RA to fill out.  Will hold message in triage per triage protocol to look out for form.

## 2016-10-26 NOTE — Telephone Encounter (Signed)
I'm happy to meet with him and discuss his differing of opinion to see if I can help/if we agree changing providers is appropriate. I speak only English if that is an issue. TY.

## 2016-10-26 NOTE — Telephone Encounter (Signed)
It is Ok to transfer care. Thanks, BJ 

## 2016-10-26 NOTE — Telephone Encounter (Signed)
Appointment scheduled with Dr. Nani Ravens on 10/29/16

## 2016-10-26 NOTE — Telephone Encounter (Signed)
Return call from [facility] [phone].[name]

## 2016-10-26 NOTE — Telephone Encounter (Signed)
I did not receive anything Pl see earlier phone notes -about change from perforomist to brovana

## 2016-10-29 ENCOUNTER — Encounter: Payer: Self-pay | Admitting: Family Medicine

## 2016-10-29 ENCOUNTER — Ambulatory Visit (INDEPENDENT_AMBULATORY_CARE_PROVIDER_SITE_OTHER): Payer: Medicare Other | Admitting: Family Medicine

## 2016-10-29 VITALS — BP 118/68 | HR 75 | Temp 98.2°F | Ht 72.0 in | Wt 212.7 lb

## 2016-10-29 DIAGNOSIS — F319 Bipolar disorder, unspecified: Secondary | ICD-10-CM

## 2016-10-29 DIAGNOSIS — F419 Anxiety disorder, unspecified: Secondary | ICD-10-CM

## 2016-10-29 MED ORDER — ARIPIPRAZOLE 5 MG PO TABS: 5.0000 mg | ORAL_TABLET | Freq: Every day | ORAL | 1 refills | Status: DC

## 2016-10-29 MED ORDER — ALPRAZOLAM 1 MG PO TABS: 0.5000 mg | ORAL_TABLET | Freq: Two times a day (BID) | ORAL | 0 refills | Status: DC | PRN

## 2016-10-29 NOTE — Progress Notes (Signed)
Chief Complaint  Patient presents with  . Transitions Of Care       New Patient Visit SUBJECTIVE: HPI: [name] is an 66 y.o.male who is being seen for establishing care.  The patient was previously seen at [facility]. He had a hard time understanding his previous PCP from a language standpoint.  Bipolar Dx'd with manic depressive vs Bipolar. Has been on Seroquel in the past. He accidentally overdosed on it and was never placed in another medication. Takes 0.5 mg Xanax prn. A 90 day supplies lasted over 6 months. He has an appointment coming up with psychiatry at the end of March. He is here for a refill of his Xanax until he can get in. He is also following with a behavioral therapist. No current thoughts of harming self or others. No self medication or drug abuse.   Allergies  Allergen Reactions  . Aspirin Anaphylaxis  . Hornet Venom Anaphylaxis  . Ivp Dye [Iodinated Diagnostic Agents] Anaphylaxis  . Levaquin [Levofloxacin In D5w] Shortness Of Breath and Swelling    In addition: sweating, chest pain, and diarrhea.   . Nsaids Anaphylaxis  . Penicillins Anaphylaxis    Heart stops Has patient had a PCN reaction causing immediate rash, facial/tongue/throat swelling, SOB or lightheadedness with hypotension: yes Has patient had a PCN reaction causing severe rash involving mucus membranes or skin necrosis: yes Has patient had a PCN reaction that required hospitalization yes Has patient had a PCN reaction occurring within the last 10 years: No If all of the above answers are "NO", then may proceed with Cephalosporin use.   . Tolmetin Anaphylaxis  . Perforomist [Formoterol]     Increased wheezing, shortness of breath  . Buprenorphine Hcl Nausea And Vomiting    Can take with zofran   . Morphine And Related Nausea And Vomiting    Can take with zofran   . Oxycodone Itching and Rash    Past Medical History:  Diagnosis Date  . Anxiety   . Anxiety disorder   .  Arthritis    BILATERAL SHOULDERS, ELBOWS AND HANDS AND LEFT HIP AND KNEES--HX CORTISONE SHOTS IN SHOULDERS, ELBOWS, HIP AND KNEES  . Basal cell carcinoma of back 04/30/2016   Dermatologist- Dr Nevada Crane;   MOHS sx- Dr Levada Dy   . Bladder outlet obstruction   . Chronic idiopathic constipation 05/08/2015  . Complication of anesthesia    DIFFICULT WAKING   . COPD (chronic obstructive pulmonary disease) Gold C Frequent exacerbations 01/13/2014   [name] 07/08/14: FeV1 51% FeV1/FVC 66% FVC 59% 10/5/2015ONO RA was normal  10/13/2014  ONO on RA NORMAL   . COPD, frequent exacerbations ([place])    pulmologist-  dr Joya Gaskins--  [place] Rm Stage C.04-25-15 recent COPD exacerbation-much improved now, after tx. in ER [facility].  Marland Kitchen Depression   . Diabetes mellitus without complication ([place])    BODERLINE - DIET CONTROL  . Dysrhythmia    PVC'S  . Family history of adverse reaction to anesthesia    father would wake up with agitation   . Former smoker 01/13/2014  . Gastroesophageal reflux disease without esophagitis   . GERD (gastroesophageal reflux disease)   . Heavy alcohol use 04/30/2016  . History of chronic bronchitis   . History of oxygen administration    oxygen use 2 l/m nasally at bedtime and exertional occasions  . History of rheumatic fever   . History of TB (tuberculosis)    1984--  hospitalized for 4 month treatment  . History  of urinary retention   . Hx of multiple concussions    x 2 per patient   . Hypertension   . Melanoma ([place])   . Nocturnal oxygen desaturation    USES O2 NIGHTLY  . OSA (obstructive sleep apnea) 01/13/2014  . Pneumonia    hx of   . PONV (postoperative nausea and vomiting)   . Post-polio syndrome    polio at age 7--PT WAS IN IRON LUNG; PT WAS IN W/C UNTIL AGE 21; STILL HAS WEAKNESS RIGHT SIDE  . Prostate cancer ([place])   . Prostate cancer ([place])   . Schizophrenia ([place])   . Shortness of breath dyspnea    RIGHT HEMIDIAPHRAGM ELEVATION - POST POLIO SYNDROME  . Tuberculosis    Hosp 4  months rx , left early    Past Surgical History:  Procedure Laterality Date  . CARDIOVASCULAR STRESS TEST  06-08-2014  dr Mare Ferrari   normal lexiscan study/  no ischemia/  not gated due to PAC's  . COLONOSCOPY N/A 05/03/2015   Procedure: COLONOSCOPY;  Surgeon: [name], MD;  Location: [facility];  Service: Endoscopy;  Laterality: N/A;  . CYSTOSCOPY N/A 10/25/2015   Procedure: CYSTOSCOPY;  Surgeon: [name], MD;  Location: [facility];  Service: Urology;  Laterality: N/A;  . CYSTOSCOPY W/ CYSTOGRAM/  TRANSRECTAL ULTRASOUND PROSTATE BX  03-22-2009  . ESOPHAGOGASTRODUODENOSCOPY N/A 03/22/2015   Procedure: ESOPHAGOGASTRODUODENOSCOPY (EGD) with dilation;  Surgeon: [name], MD;  Location: [facility];  Service: Endoscopy;  Laterality: N/A;  . excision of skin lesion    . LAPAROSCOPIC CHOLECYSTECTOMY  2013  . left elbow surgery      due to fracture   . NASAL SEPTUM SURGERY  2000  . OTHER SURGICAL HISTORY      Muscle & bone Graft/Polio  . polio surgeries      14 polio surgeries   . PROSTATE BIOPSY N/A 09/28/2014   Procedure: PROSTATE ULTRASOUND/BIOPSY;  Surgeon: [name], MD;  Location: [facility];  Service: Urology;  Laterality: N/A;  . PROSTATE BIOPSY N/A 10/25/2015   Procedure: PROSTATE BIOPSY AND ULTRASOUND;  Surgeon: [name], MD;  Location: [facility];  Service: Urology;  Laterality: N/A;  . SAVORY DILATION N/A 03/22/2015   Procedure: SAVORY DILATION;  Surgeon: [name], MD;  Location: [facility];  Service: Endoscopy;  Laterality: N/A;  . SHOULDER ARTHROSCOPY WITH OPEN ROTATOR CUFF REPAIR Bilateral 2013  &  1999   removal spurs and bursectomy  . TRANSURETHRAL INCISION OF BLADDER NECK N/A 10/25/2015   Procedure:  TRANSURETHRAL INCISION OF BLADDER NECK;  Surgeon: [name], MD;  Location: [facility];  Service: Urology;  Laterality: N/A;  . TRANSURETHRAL RESECTION OF PROSTATE N/A 09/28/2014   Procedure: TRANSURETHRAL RESECTION OF THE PROSTATE (TURP);  Surgeon: [name], MD;   Location: [facility];  Service: Urology;  Laterality: N/A;  . [place] EXTRACTION  2000   Social History   Social History  . Marital status: Married   Occupational History  . Retired    Social History Main Topics  . Smoking status: Former Smoker    Years: 47.00    Quit date: 08/25/2013  . Smokeless tobacco: Former Systems developer  . Alcohol use 3.6 oz/week    6 Cans of beer per week     Comment: daily  . Drug use: No     Comment: hx of marijuana use   . Sexual activity: Yes    Birth control/ protection: None   Family History  Problem Relation Age of Onset  . Alzheimer's disease Father 4    Deceased  . Stomach cancer Father   . Heart attack Father   . Heart disease Father   . Skin cancer Mother     Facial-Living  . Cancer Mother     skin  . Alcohol abuse Sister     x2  . Mental illness Sister     x2  . Alcohol abuse Sister   . Diabetes Maternal Aunt     x2  . Thyroid disease Maternal Aunt     x4  . Diabetes Maternal Uncle   . Tuberculosis Paternal Grandfather   . Tuberculosis Paternal Grandmother   . Alzheimer's disease Paternal Aunt   . Alzheimer's disease Paternal Uncle   . Colon cancer Neg Hx   . Colon polyps Neg Hx   . Crohn's disease Neg Hx   . Ulcerative colitis Neg Hx      Current Outpatient Prescriptions:  .  acetaminophen (TYLENOL) 500 MG tablet, Take 500 mg by mouth every 6 (six) hours as needed for headache (pain)., Disp: , Rfl:  .  albuterol (PROVENTIL HFA;VENTOLIN HFA) 108 (90 Base) MCG/ACT inhaler, Inhale 2 puffs into the lungs every 6 (six) hours as needed for wheezing or shortness of breath., Disp: 3 Inhaler, Rfl: 3 .  ALPRAZolam (XANAX) 1 MG tablet, Take 0.5 tablets (0.5 mg total) by mouth 2 (two) times daily as needed for anxiety., Disp: 60 tablet, Rfl: 0 .  arformoterol (BROVANA) 15 MCG/2ML NEBU, Take 2 mLs (15 mcg total) by nebulization 2 (two) times daily., Disp: 120 mL, Rfl: 6 .  atorvastatin (LIPITOR) 40 MG tablet, One tab q hs (Patient  taking differently: Take 40 mg by mouth at bedtime. ), Disp: 90 tablet, Rfl: 1 .  Blood Glucose Monitoring Suppl (ACCU-CHEK AVIVA) device, Use to check blood sugars every morning fasting, 2 hours after largest meal and as needed with low blood sugar symptoms, Disp: 1 each, Rfl: 0 .  budesonide (PULMICORT) 0.25 MG/2ML nebulizer solution, Take 2 mLs (0.25 mg total) by nebulization 2 (two) times daily., Disp: 120 mL, Rfl: 6 .  dexlansoprazole (DEXILANT) 60 MG capsule, Take 1 capsule (60 mg total) by mouth daily., Disp: 90 capsule, Rfl: 1 .  EPINEPHrine 0.3 mg/0.3 mL IJ SOAJ injection, Inject 0.3 mLs (0.3 mg total) into the muscle once. (Patient taking differently: Inject 0.3 mg into the muscle once as needed (severe allergic reactions). ), Disp: 1 Device, Rfl: 1 .  fluticasone furoate-vilanterol (BREO ELLIPTA) 100-25 MCG/INH AEPB, INHALE 1 PUFF BY MOUTH EVERY MORNING (Patient taking differently: Inhale 1 puff into the lungs daily. INHALE 1 PUFF BY MOUTH EVERY MORNIN), Disp: 180 each, Rfl: 3 .  ipratropium-albuterol (DUONEB) 0.5-2.5 (3) MG/3ML SOLN, Take 3 mLs by nebulization every 4 (four) hours as needed (shortness of breath and wheezing)., Disp: , Rfl:  .  metoprolol succinate (TOPROL-XL) 25 MG 24 hr tablet, Take 1 tablet (25 mg total) by mouth daily., Disp: 30 tablet, Rfl: 0 .  montelukast (SINGULAIR) 10 MG tablet, TAKE 1 TABLET (10 MG TOTAL) BY MOUTH AT BEDTIME. (Patient taking differently: Take 10 mg by mouth at bedtime as needed (seasonal allergies). ), Disp: 90 tablet, Rfl: 1 .  nitroGLYCERIN (NITROSTAT) 0.4 MG SL tablet, Place 1 tablet (0.4 mg total) under the tongue every 5 (five) minutes as needed for chest pain., Disp: 30 tablet, Rfl: 3 .  ondansetron (ZOFRAN) 4 MG tablet, Take 1 tablet (4 mg total) by mouth  every 12 (twelve) hours as needed for nausea or vomiting., Disp: 30 tablet, Rfl: 2 .  OXYGEN, Inhale 2 L into the lungs at bedtime. Reported on 11/25/2015, Disp: , Rfl:  .  ARIPiprazole  (ABILIFY) 5 MG tablet, Take 1 tablet (5 mg total) by mouth daily., Disp: 30 tablet, Rfl: 1 .  fluticasone (FLONASE) 50 MCG/ACT nasal spray, Place 2 sprays into both nostrils daily. (Patient not taking: Reported on 10/29/2016), Disp: 16 g, Rfl: 6 .  oxymetazoline (AFRIN) 0.05 % nasal spray, Place into the nose., Disp: , Rfl:  .  sodium phosphate (FLEET) 7-19 GM/118ML ENEM, Place 1 enema rectally as needed for severe constipation., Disp: , Rfl:   ROS Psych: As noted in HPI   OBJECTIVE: BP 118/68 (BP Location: Left Arm, Patient Position: Sitting, Cuff Size: Large)   Pulse 75   Temp 98.2 F (36.8 C) (Oral)   Ht 6' (1.829 m)   Wt 212 lb 11.2 oz (96.5 kg)   SpO2 94% Comment: RA  BMI 28.85 kg/m   Constitutional: -  VS reviewed -  Well developed, well nourished, appears stated age -  No apparent distress  Psychiatric: -  Oriented to person, place, and time -  Memory intact -  Affect and mood normal -  Fluent conversation, good eye contact -  Judgment and insight age appropriate  Neck: -  No gross swelling, no palpable masses -  Thyroid midline, not enlarged, mobile, no palpable masses  Cardiovascular: -  RRR, no murmurs -  No LE edema  Respiratory: -  Normal respiratory effort, no accessory muscle use, no retraction -  Breath sounds equal, no wheezes, no ronchi, no crackles  Skin: -  No significant lesion on inspection -  Warm and dry to palpation   ASSESSMENT/PLAN: Bipolar 1 disorder ([place]) - Plan: ALPRAZolam (XANAX) 1 MG tablet, ARIPiprazole (ABILIFY) 5 MG tablet  Anxiety - Plan: ALPRAZolam (XANAX) 1 MG tablet  I'm OK to see the pt if his only issue w his Previous provider was language issues. He did speak highly of Dr. Martinique as a clinician, however simply had issues with discerning her speech. I'm okay refilling his Xanax until he can see psychiatry. He is following with behavioral specialist. Given his history of bipolar, I will hold off on prescribing serotonin altering  medication. Will try Abilify as it is weight neutral. Patient should return in 6 weeks to recheck his progress with the Abilify. The patient voiced understanding and agreement to the plan.   Glen Rose, DO 10/29/16  4:54 PM

## 2016-10-29 NOTE — Progress Notes (Signed)
Pre visit review using our clinic review tool, if applicable. No additional management support is needed unless otherwise documented below in the visit note. 

## 2016-10-29 NOTE — Telephone Encounter (Signed)
Spoke with [facility], Caren Griffins. Requesting notes from the chart stating the start of the [facility]chet - telephone note 10/24/16 mentioning Brovana and medication list printed to be faxed to [phone] Caren Griffins is faxing over a prescription form for Dr Elsworth Soho to fill out and to be faxed back with the above documentation attached. Will hold in triage to keep an eye out for the form as it is being faxed to this fax number. Documentation will need to be printed once form is  Received.

## 2016-10-30 NOTE — Telephone Encounter (Signed)
Received fax from [facility] with [facility]. Will fill out form and print out forms for RA to sign tomorrow 10/31/16.

## 2016-10-30 NOTE — Telephone Encounter (Signed)
Form received has the wrong physician on it - name needs to be changed to Dr Elsworth Soho as the prescribing MD for the Brovana not Rexene Edison, NP. Corrected form is being faxed to triage and will need to be faxed back with [facility] and 10/24/16 telephone note attached. Will hold in triage and wait for fax

## 2016-11-02 ENCOUNTER — Telehealth: Payer: Self-pay | Admitting: Pulmonary Disease

## 2016-11-02 MED ORDER — ALBUTEROL SULFATE HFA 108 (90 BASE) MCG/ACT IN AERS: 2.0000 | INHALATION_SPRAY | Freq: Four times a day (QID) | RESPIRATORY_TRACT | 3 refills | Status: DC | PRN

## 2016-11-02 NOTE — Telephone Encounter (Signed)
Clinic cancelled 1/17 due to inclement weather  RA back in the office 1/25  Will forward back to [place] to have him sign when he comes back

## 2016-11-02 NOTE — Telephone Encounter (Signed)
Spoke with pt. He needs a prescription sent in for Ventolin. Rx has been sent in to his preferred pharmacy. Nothing further was needed.

## 2016-11-03 ENCOUNTER — Emergency Department (HOSPITAL_BASED_OUTPATIENT_CLINIC_OR_DEPARTMENT_OTHER): Payer: Medicare Other

## 2016-11-03 ENCOUNTER — Inpatient Hospital Stay (HOSPITAL_BASED_OUTPATIENT_CLINIC_OR_DEPARTMENT_OTHER)
Admission: EM | Admit: 2016-11-03 | Discharge: 2016-11-06 | DRG: 191 | Disposition: A | Discharge status: Home / Self Care | Payer: Medicare Other | Attending: Internal Medicine | Admitting: Internal Medicine

## 2016-11-03 ENCOUNTER — Encounter (HOSPITAL_BASED_OUTPATIENT_CLINIC_OR_DEPARTMENT_OTHER): Payer: Self-pay | Admitting: Emergency Medicine

## 2016-11-03 DIAGNOSIS — E119 Type 2 diabetes mellitus without complications: Secondary | ICD-10-CM

## 2016-11-03 DIAGNOSIS — Z818 Family history of other mental and behavioral disorders: Secondary | ICD-10-CM

## 2016-11-03 DIAGNOSIS — G4733 Obstructive sleep apnea (adult) (pediatric): Secondary | ICD-10-CM | POA: Diagnosis present

## 2016-11-03 DIAGNOSIS — Z885 Allergy status to narcotic agent status: Secondary | ICD-10-CM

## 2016-11-03 DIAGNOSIS — Z808 Family history of malignant neoplasm of other organs or systems: Secondary | ICD-10-CM

## 2016-11-03 DIAGNOSIS — Z91030 Bee allergy status: Secondary | ICD-10-CM

## 2016-11-03 DIAGNOSIS — Z87820 Personal history of traumatic brain injury: Secondary | ICD-10-CM

## 2016-11-03 DIAGNOSIS — I10 Essential (primary) hypertension: Secondary | ICD-10-CM | POA: Diagnosis not present

## 2016-11-03 DIAGNOSIS — Z833 Family history of diabetes mellitus: Secondary | ICD-10-CM

## 2016-11-03 DIAGNOSIS — J4551 Severe persistent asthma with (acute) exacerbation: Secondary | ICD-10-CM | POA: Diagnosis present

## 2016-11-03 DIAGNOSIS — I493 Ventricular premature depolarization: Secondary | ICD-10-CM | POA: Diagnosis present

## 2016-11-03 DIAGNOSIS — Z888 Allergy status to other drugs, medicaments and biological substances status: Secondary | ICD-10-CM

## 2016-11-03 DIAGNOSIS — Z9119 Patient's noncompliance with other medical treatment and regimen: Secondary | ICD-10-CM

## 2016-11-03 DIAGNOSIS — Z800 Family history of malignant neoplasm of digestive organs: Secondary | ICD-10-CM

## 2016-11-03 DIAGNOSIS — Z811 Family history of alcohol abuse and dependence: Secondary | ICD-10-CM

## 2016-11-03 DIAGNOSIS — F419 Anxiety disorder, unspecified: Secondary | ICD-10-CM | POA: Diagnosis not present

## 2016-11-03 DIAGNOSIS — R0602 Shortness of breath: Secondary | ICD-10-CM | POA: Diagnosis not present

## 2016-11-03 DIAGNOSIS — F319 Bipolar disorder, unspecified: Secondary | ICD-10-CM | POA: Diagnosis present

## 2016-11-03 DIAGNOSIS — Z87891 Personal history of nicotine dependence: Secondary | ICD-10-CM

## 2016-11-03 DIAGNOSIS — K219 Gastro-esophageal reflux disease without esophagitis: Secondary | ICD-10-CM | POA: Diagnosis present

## 2016-11-03 DIAGNOSIS — Z881 Allergy status to other antibiotic agents status: Secondary | ICD-10-CM

## 2016-11-03 DIAGNOSIS — Z9049 Acquired absence of other specified parts of digestive tract: Secondary | ICD-10-CM

## 2016-11-03 DIAGNOSIS — J4541 Moderate persistent asthma with (acute) exacerbation: Secondary | ICD-10-CM

## 2016-11-03 DIAGNOSIS — T380X5A Adverse effect of glucocorticoids and synthetic analogues, initial encounter: Secondary | ICD-10-CM | POA: Diagnosis present

## 2016-11-03 DIAGNOSIS — J45901 Unspecified asthma with (acute) exacerbation: Secondary | ICD-10-CM | POA: Diagnosis present

## 2016-11-03 DIAGNOSIS — Z8611 Personal history of tuberculosis: Secondary | ICD-10-CM

## 2016-11-03 DIAGNOSIS — Y92230 Patient room in hospital as the place of occurrence of the external cause: Secondary | ICD-10-CM | POA: Diagnosis present

## 2016-11-03 DIAGNOSIS — J441 Chronic obstructive pulmonary disease with (acute) exacerbation: Principal | ICD-10-CM

## 2016-11-03 DIAGNOSIS — Z880 Allergy status to penicillin: Secondary | ICD-10-CM

## 2016-11-03 DIAGNOSIS — Z85820 Personal history of malignant melanoma of skin: Secondary | ICD-10-CM

## 2016-11-03 DIAGNOSIS — R634 Abnormal weight loss: Secondary | ICD-10-CM | POA: Diagnosis present

## 2016-11-03 DIAGNOSIS — G14 Postpolio syndrome: Secondary | ICD-10-CM | POA: Diagnosis not present

## 2016-11-03 DIAGNOSIS — Z79899 Other long term (current) drug therapy: Secondary | ICD-10-CM

## 2016-11-03 DIAGNOSIS — R079 Chest pain, unspecified: Secondary | ICD-10-CM | POA: Diagnosis present

## 2016-11-03 DIAGNOSIS — J209 Acute bronchitis, unspecified: Secondary | ICD-10-CM | POA: Diagnosis present

## 2016-11-03 DIAGNOSIS — E1165 Type 2 diabetes mellitus with hyperglycemia: Secondary | ICD-10-CM | POA: Diagnosis present

## 2016-11-03 DIAGNOSIS — Z8546 Personal history of malignant neoplasm of prostate: Secondary | ICD-10-CM

## 2016-11-03 DIAGNOSIS — Z91199 Patient's noncompliance with other medical treatment and regimen due to unspecified reason: Secondary | ICD-10-CM

## 2016-11-03 DIAGNOSIS — Z820 Family history of epilepsy and other diseases of the nervous system: Secondary | ICD-10-CM

## 2016-11-03 DIAGNOSIS — Z7951 Long term (current) use of inhaled steroids: Secondary | ICD-10-CM

## 2016-11-03 DIAGNOSIS — Z8249 Family history of ischemic heart disease and other diseases of the circulatory system: Secondary | ICD-10-CM

## 2016-11-03 DIAGNOSIS — Z886 Allergy status to analgesic agent status: Secondary | ICD-10-CM

## 2016-11-03 DIAGNOSIS — E785 Hyperlipidemia, unspecified: Secondary | ICD-10-CM | POA: Diagnosis present

## 2016-11-03 DIAGNOSIS — Z91041 Radiographic dye allergy status: Secondary | ICD-10-CM

## 2016-11-03 LAB — URINALYSIS, ROUTINE W REFLEX MICROSCOPIC
Bilirubin Urine: NEGATIVE
Glucose, UA: NEGATIVE mg/dL
Hgb urine dipstick: NEGATIVE
Ketones, ur: NEGATIVE mg/dL
LEUKOCYTES UA: NEGATIVE
NITRITE: NEGATIVE
PH: 5.5 (ref 5.0–8.0)
Protein, ur: NEGATIVE mg/dL
SPECIFIC GRAVITY, URINE: 1.005 (ref 1.005–1.030)

## 2016-11-03 LAB — I-STAT ARTERIAL BLOOD GAS, ED
Acid-base deficit: 6 mmol/L — ABNORMAL HIGH (ref 0.0–2.0)
BICARBONATE: 18.1 mmol/L — AB (ref 20.0–28.0)
O2 Saturation: 93 %
PO2 ART: 63 mmHg — AB (ref 83.0–108.0)
TCO2: 19 mmol/L (ref 0–100)
pCO2 arterial: 29.5 mmHg — ABNORMAL LOW (ref 32.0–48.0)
pH, Arterial: 7.395 (ref 7.350–7.450)

## 2016-11-03 LAB — BASIC METABOLIC PANEL
ANION GAP: 11 (ref 5–15)
BUN: 8 mg/dL (ref 6–20)
CHLORIDE: 104 mmol/L (ref 101–111)
CO2: 20 mmol/L — AB (ref 22–32)
Calcium: 9.4 mg/dL (ref 8.9–10.3)
Creatinine, Ser: 1.08 mg/dL (ref 0.61–1.24)
GFR calc non Af Amer: >60 mL/min (ref >60)
Glucose, Bld: 147 mg/dL — ABNORMAL HIGH (ref 65–99)
Potassium: 3.9 mmol/L (ref 3.5–5.1)
Sodium: 135 mmol/L (ref 135–145)

## 2016-11-03 LAB — CBC WITH DIFFERENTIAL/PLATELET
Basophils Absolute: 0 10*3/uL (ref 0.0–0.1)
Basophils Relative: 0 %
EOS PCT: 1 %
Eosinophils Absolute: 0.1 10*3/uL (ref 0.0–0.7)
HEMATOCRIT: 45.1 % (ref 39.0–52.0)
HEMOGLOBIN: 15.5 g/dL (ref 13.0–17.0)
LYMPHS PCT: 12 %
Lymphs Abs: 0.7 10*3/uL (ref 0.7–4.0)
MCH: 31.5 pg (ref 26.0–34.0)
MCHC: 34.4 g/dL (ref 30.0–36.0)
MCV: 91.7 fL (ref 78.0–100.0)
MONOS PCT: 1 %
Monocytes Absolute: 0.1 10*3/uL (ref 0.1–1.0)
Neutro Abs: 5.1 10*3/uL (ref 1.7–7.7)
Neutrophils Relative %: 86 %
Platelets: 204 10*3/uL (ref 150–400)
RBC: 4.92 MIL/uL (ref 4.22–5.81)
RDW: 13.3 % (ref 11.5–15.5)
WBC: 6 10*3/uL (ref 4.0–10.5)

## 2016-11-03 MED ORDER — ALBUTEROL (5 MG/ML) CONTINUOUS INHALATION SOLN
10.0000 mg/h | INHALATION_SOLUTION | RESPIRATORY_TRACT | Status: DC
  Administered 2016-11-03: 10 mg/h via RESPIRATORY_TRACT
  Filled 2016-11-03: qty 20

## 2016-11-03 MED ORDER — IPRATROPIUM-ALBUTEROL 0.5-2.5 (3) MG/3ML IN SOLN
RESPIRATORY_TRACT | Status: AC
  Administered 2016-11-03: 3 mL
  Filled 2016-11-03: qty 3

## 2016-11-03 MED ORDER — METHYLPREDNISOLONE SODIUM SUCC 125 MG IJ SOLR
125.0000 mg | Freq: Once | INTRAMUSCULAR | Status: AC
  Administered 2016-11-03: 125 mg via INTRAVENOUS
  Filled 2016-11-03: qty 2

## 2016-11-03 MED ORDER — ALBUTEROL SULFATE (2.5 MG/3ML) 0.083% IN NEBU
INHALATION_SOLUTION | RESPIRATORY_TRACT | Status: AC
  Administered 2016-11-03: 2.5 mg
  Filled 2016-11-03: qty 3

## 2016-11-03 MED ORDER — ONDANSETRON HCL 4 MG/2ML IJ SOLN
4.0000 mg | Freq: Once | INTRAMUSCULAR | Status: AC
  Administered 2016-11-03: 4 mg via INTRAVENOUS
  Filled 2016-11-03: qty 2

## 2016-11-03 MED ORDER — ALPRAZOLAM 0.5 MG PO TABS
1.0000 mg | ORAL_TABLET | Freq: Once | ORAL | Status: AC
  Administered 2016-11-03: 1 mg via ORAL
  Filled 2016-11-03: qty 2

## 2016-11-03 NOTE — ED Notes (Signed)
Pt presents with SHOB and CP. Hx asthma/COPD. Has been treating himself at home x 1 week with treatments and steroids, but got worse today. Also states he was allergic to one of the meds they tried and he is currently waiting for the other to arrive. Nicky, RRT in with pt and administering tx.

## 2016-11-03 NOTE — ED Notes (Signed)
ED Provider at bedside and scribe

## 2016-11-03 NOTE — ED Triage Notes (Signed)
Patient reports that he is having trouble breathing. Patient is is wheezing throughout.

## 2016-11-03 NOTE — ED Notes (Signed)
Pt ambulated around dept. On room air. SATS steady at 93% while walking and talking. Returned to room SATS now 97%. No distress noted.

## 2016-11-03 NOTE — ED Notes (Signed)
PCXR being done.

## 2016-11-03 NOTE — ED Provider Notes (Signed)
[facility] DEPT [facility] Provider Note   CSN: [id number] Arrival date & time: 11/03/16  1823  By signing my name below, I, Hansel Feinstein, attest that this documentation has been prepared under the direction and in the presence of  [place]. Janit Bern, NP. Electronically Signed: Hansel Feinstein, ED Scribe. 11/03/16. 6:43 PM.    History   Chief Complaint Chief Complaint  Patient presents with  . Shortness of Breath    HPI MOURICE CALMES is a 66 y.o. male with h/o asthma, bronchitis, COPD and multiple other medical and mental health problems who presents to the Emergency Department complaining of gradually worsening SOB that began 2 weeks ago with associated cough, wheezing, chest pain and tightness. Pt states his current symptoms feel similar to prior asthma exacerbations. He reports having been admitted in the past for similar symptoms.  Per wife, pt has used his home inhaler and nebulizer BID with inadequate relief. Per wife the pts breathing treatments were recently switched to Budesonide as the pt was told that he may be allergic to his old treatments by his PCP. Wife also states pt has been taking Mucinex without relief. Pt has also been taking qd 20 mg Prednisone for the last week. Per wife, stress is one trigger of his asthma and he takes Xanax daily. Pt denies additional pain or complaints. Today symptoms became severe and patient felt like he could not breathe. He then became very anxious.  The history is provided by the patient and the spouse. No language interpreter was used.  Shortness of Breath  This is a recurrent problem. The average episode lasts 3 weeks. The problem occurs intermittently.The current episode started more than 1 week ago. The problem has been gradually worsening. Associated symptoms include cough, wheezing and chest pain. Pertinent negatives include no fever, no neck pain, no vomiting, no abdominal pain and no rash. He has tried oral steroids and beta-agonist inhalers for the  symptoms. The treatment provided mild relief. Associated medical issues include asthma and COPD.    Past Medical History:  Diagnosis Date  . Anxiety   . Anxiety disorder   . Arthritis    BILATERAL SHOULDERS, ELBOWS AND HANDS AND LEFT HIP AND KNEES--HX CORTISONE SHOTS IN SHOULDERS, ELBOWS, HIP AND KNEES  . Basal cell carcinoma of back 04/30/2016   Dermatologist- Dr Nevada Crane;   MOHS sx- Dr Levada Dy   . Bladder outlet obstruction   . Chronic idiopathic constipation 05/08/2015  . Complication of anesthesia    DIFFICULT WAKING   . COPD (chronic obstructive pulmonary disease) [place] C Frequent exacerbations 01/13/2014   [name] 07/08/14: FeV1 51% FeV1/FVC 66% FVC 59% 10/5/2015ONO RA was normal  10/13/2014  ONO on RA NORMAL   . COPD, frequent exacerbations ([place])    pulmologist-  dr Joya Gaskins--  [place] Rm Stage C.04-25-15 recent COPD exacerbation-much improved now, after tx. in ER [facility].  Marland Kitchen Depression   . Diabetes mellitus without complication ([place])    BODERLINE - DIET CONTROL  . Dysrhythmia    PVC'S  . Family history of adverse reaction to anesthesia    father would wake up with agitation   . Former smoker 01/13/2014  . Gastroesophageal reflux disease without esophagitis   . GERD (gastroesophageal reflux disease)   . Heavy alcohol use 04/30/2016  . History of chronic bronchitis   . History of oxygen administration    oxygen use 2 l/m nasally at bedtime and exertional occasions  . History of rheumatic fever   . History of TB (  tuberculosis)    1984--  hospitalized for 4 month treatment  . History of urinary retention   . Hx of multiple concussions    x 2 per patient   . Hypertension   . Melanoma ([place])   . Nocturnal oxygen desaturation    USES O2 NIGHTLY  . OSA (obstructive sleep apnea) 01/13/2014  . Pneumonia    hx of   . PONV (postoperative nausea and vomiting)   . Post-polio syndrome    polio at age 49--PT WAS IN IRON LUNG; PT WAS IN W/C UNTIL AGE 49; STILL HAS WEAKNESS RIGHT SIDE  . Prostate  cancer ([place])   . Prostate cancer ([place])   . Schizophrenia ([place])   . Shortness of breath dyspnea    RIGHT HEMIDIAPHRAGM ELEVATION - POST POLIO SYNDROME  . Tuberculosis    Hosp 4 months rx , left early     Patient Active Problem List   Diagnosis Date Noted  . Asthma exacerbation 11/03/2016  . Nausea without vomiting 09/17/2016  . Shortness of breath   . Diabetes mellitus type 2 in obese ([place]) 06/26/2016  . non-specific Chest pain 06/12/2016  . Patient's noncompliance with other medical treatment and regimen 06/02/2016  . Hyperlipidemia, mixed 06/02/2016  . Elevated liver enzymes  06/02/2016  . Schizophrenia ([place]) 04/30/2016  . Environmental and seasonal allergies 04/30/2016  . Diabetes mellitus without complication- (diet controlled; w/o proteinuria) 04/30/2016  . Heavy alcohol use- Many yrs  04/30/2016  . Basal cell carcinoma of back 04/30/2016  . Obesity 04/30/2016  . Hx of multiple concussions 04/30/2016  . h/o Vitamin D deficiency 04/30/2016  . Screen for sexually transmitted diseases 04/30/2016  . Immunocompromised state ([place]) 04/30/2016  . Cervical radiculopathy 01/15/2016  . Alcohol withdrawal ([place]) 11/22/2015  . h/o Suicidal thoughts 11/21/2015  . Dry mouth 10/07/2015  . Anxiety 09/19/2015  . OAB (overactive bladder) 07/27/2015  . Chronic idiopathic constipation 05/08/2015  . Screening for colon cancer   . Benign neoplasm of cecum   . Benign neoplasm of ascending colon   . Benign neoplasm of transverse colon   . Benign neoplasm of sigmoid colon   . Rectal polyp   . Gastroesophageal reflux disease without esophagitis   . Esophageal stricture   . Joint stiffness of multiple sites 10/17/2014  . Benign localized hyperplasia of prostate with urinary obstruction 09/29/2014  . concerns for memory loss 09/13/2014  . Tuberculosis   . Diverticulum of bladder 06/17/2014  . Neuropathy ([place]) 04/11/2014  . Post-polio syndrome 01/13/2014  . COPD (chronic obstructive pulmonary  disease) ([place]) 01/13/2014  . Hypertension 01/13/2014  . Former smoker 01/13/2014  . h/o Prostate cancer 01/13/2014  . Bipolar disorder ([place]) 01/13/2014  . Explosive personality disorder 01/13/2014  . Erectile dysfunction 01/13/2014  . Nephrolithiasis 01/13/2014  . Chronic pain 01/13/2014  . Alcoholism ([place]) 01/13/2014  . OSA (obstructive sleep apnea) 01/13/2014    Past Surgical History:  Procedure Laterality Date  . CARDIOVASCULAR STRESS TEST  06-08-2014  dr Mare Ferrari   normal lexiscan study/  no ischemia/  not gated due to PAC's  . COLONOSCOPY N/A 05/03/2015   Procedure: COLONOSCOPY;  Surgeon: [name], MD;  Location: [facility] END[facility];  Service: Endoscopy;  Laterality: N/A;  . CYSTOSCOPY N/A 10/25/2015   Procedure: CYSTOSCOPY;  Surgeon: [name], MD;  Location: [facility] ORS;  Service: Urology;  Laterality: N/A;  . CYSTOSCOPY W/ CYSTOGRAM/  TRANSRECTAL ULTRASOUND PROSTATE BX  03-22-2009  . ESOPHAGOGASTRODUODENOSCOPY N/A 03/22/2015   Procedure: ESOPHAGOGASTRODUODENOSCOPY (EGD) with  dilation;  Surgeon: [name], MD;  Location: [facility];  Service: Endoscopy;  Laterality: N/A;  . excision of skin lesion    . LAPAROSCOPIC CHOLECYSTECTOMY  2013  . left elbow surgery      due to fracture   . NASAL SEPTUM SURGERY  2000  . OTHER SURGICAL HISTORY      Muscle & bone Graft/Polio  . polio surgeries      14 polio surgeries   . PROSTATE BIOPSY N/A 09/28/2014   Procedure: PROSTATE ULTRASOUND/BIOPSY;  Surgeon: [name], MD;  Location: [facility];  Service: Urology;  Laterality: N/A;  . PROSTATE BIOPSY N/A 10/25/2015   Procedure: PROSTATE BIOPSY AND ULTRASOUND;  Surgeon: [name], MD;  Location: [facility];  Service: Urology;  Laterality: N/A;  . SAVORY DILATION N/A 03/22/2015   Procedure: SAVORY DILATION;  Surgeon: [name], MD;  Location: [facility];  Service: Endoscopy;  Laterality: N/A;  . SHOULDER ARTHROSCOPY WITH OPEN ROTATOR CUFF REPAIR Bilateral 2013  &  1999   removal spurs and  bursectomy  . TRANSURETHRAL INCISION OF BLADDER NECK N/A 10/25/2015   Procedure:  TRANSURETHRAL INCISION OF BLADDER NECK;  Surgeon: [name], MD;  Location: [facility];  Service: Urology;  Laterality: N/A;  . TRANSURETHRAL RESECTION OF PROSTATE N/A 09/28/2014   Procedure: TRANSURETHRAL RESECTION OF THE PROSTATE (TURP);  Surgeon: [name], MD;  Location: [facility];  Service: Urology;  Laterality: N/A;  . URETEROSOPY STONE EXTRACTION  2000       Home Medications    Prior to Admission medications   Medication Sig Start Date End Date Taking? Authorizing Provider  acetaminophen (TYLENOL) 500 MG tablet Take 500 mg by mouth every 6 (six) hours as needed for headache (pain).    Historical Provider, MD  albuterol (PROVENTIL HFA;VENTOLIN HFA) 108 (90 Base) MCG/ACT inhaler Inhale 2 puffs into the lungs every 6 (six) hours as needed for wheezing or shortness of breath. 11/02/16   Rigoberto Noel, MD  ALPRAZolam [name]) 1 MG tablet Take 0.5 tablets (0.5 mg total) by mouth 2 (two) times daily as needed for anxiety. 10/29/16   Crosby Oyster Wendling, DO  arformoterol (BROVANA) 15 MCG/2ML NEBU Take 2 mLs (15 mcg total) by nebulization 2 (two) times daily. 10/24/16   Rigoberto Noel, MD  ARIPiprazole (ABILIFY) 5 MG tablet Take 1 tablet (5 mg total) by mouth daily. 10/29/16   Shelda Pal, DO  atorvastatin (LIPITOR) 40 MG tablet One tab q hs Patient taking differently: Take 40 mg by mouth at bedtime.  05/28/16   [name], DO  Blood Glucose Monitoring Suppl (ACCU-CHEK AVIVA) device Use to check blood sugars every morning fasting, 2 hours after largest meal and as needed with low blood sugar symptoms 06/12/16 06/12/17  Deborah Opalski, DO  budesonide (PULMICORT) 0.25 MG/2ML nebulizer solution Take 2 mLs (0.25 mg total) by nebulization 2 (two) times daily. 09/18/16   Tammy S Parrett, NP  dexlansoprazole (DEXILANT) 60 MG capsule Take 1 capsule (60 mg total) by mouth daily. 09/17/16   Betty G Martinique, MD    EPINEPHrine 0.3 mg/0.3 mL IJ SOAJ injection Inject 0.3 mLs (0.3 mg total) into the muscle once. Patient taking differently: Inject 0.3 mg into the muscle once as needed (severe allergic reactions).  02/24/16   Malvin Johns, MD  fluticasone (FLONASE) 50 MCG/ACT nasal spray Place 2 sprays into both nostrils daily. Patient not taking: Reported on 10/29/2016 08/03/15   Brunetta Jeans, PA-C  fluticasone  furoate-vilanterol (BREO ELLIPTA) 100-25 MCG/INH AEPB INHALE 1 PUFF BY MOUTH EVERY MORNING Patient taking differently: Inhale 1 puff into the lungs daily. INHALE 1 PUFF BY MOUTH EVERY MORNIN 06/29/16   Rigoberto Noel, MD  ipratropium-albuterol (DUONEB) 0.5-2.5 (3) MG/3ML SOLN Take 3 mLs by nebulization every 4 (four) hours as needed (shortness of breath and wheezing).    Historical Provider, MD  metoprolol succinate (TOPROL-XL) 25 MG 24 hr tablet Take 1 tablet (25 mg total) by mouth daily. 07/06/16   Deborah Opalski, DO  montelukast (SINGULAIR) 10 MG tablet TAKE 1 TABLET (10 MG TOTAL) BY MOUTH AT BEDTIME. Patient taking differently: Take 10 mg by mouth at bedtime as needed (seasonal allergies).  04/02/16   Brunetta Jeans, PA-C  nitroGLYCERIN (NITROSTAT) 0.4 MG SL tablet Place 1 tablet (0.4 mg total) under the tongue every 5 (five) minutes as needed for chest pain. 06/21/15   Brunetta Jeans, PA-C  ondansetron (ZOFRAN) 4 MG tablet Take 1 tablet (4 mg total) by mouth every 12 (twelve) hours as needed for nausea or vomiting. 09/17/16   Betty G Martinique, MD  OXYGEN Inhale 2 L into the lungs at bedtime. Reported on 11/25/2015    Historical Provider, MD  oxymetazoline (AFRIN) 0.05 % nasal spray Place into the nose.    Historical Provider, MD  sodium phosphate (FLEET) 7-19 GM/118ML ENEM Place 1 enema rectally as needed for severe constipation.    Historical Provider, MD    Family History Family History  Problem Relation Age of Onset  . Alzheimer's disease Father 4    Deceased  . Stomach cancer Father   . Heart  attack Father   . Heart disease Father   . Skin cancer Mother     Facial-Living  . Cancer Mother     skin  . Alcohol abuse Sister     x2  . Mental illness Sister     x2  . Alcohol abuse Sister   . Diabetes Maternal Aunt     x2  . Thyroid disease Maternal Aunt     x4  . Diabetes Maternal Uncle   . Tuberculosis Paternal Grandfather   . Tuberculosis Paternal Grandmother   . Alzheimer's disease Paternal Aunt   . Alzheimer's disease Paternal Uncle   . Colon cancer Neg Hx   . Colon polyps Neg Hx   . Crohn's disease Neg Hx   . Ulcerative colitis Neg Hx     Social History Social History  Substance Use Topics  . Smoking status: Former Smoker    Years: 47.00    Quit date: 08/25/2013  . Smokeless tobacco: Former Systems developer  . Alcohol use 3.6 oz/week    6 Cans of beer per week     Comment: daily     Allergies   Aspirin; Hornet venom; Ivp dye [iodinated diagnostic agents]; Levaquin [levofloxacin in d5w]; Nsaids; Penicillins; Tolmetin; Perforomist [formoterol]; Buprenorphine hcl; Morphine and related; and Oxycodone   Review of Systems Review of Systems  Constitutional: Negative for fever.  HENT: Positive for congestion. Negative for trouble swallowing.   Respiratory: Positive for cough, chest tightness, shortness of breath and wheezing.   Cardiovascular: Positive for chest pain.  Gastrointestinal: Negative for abdominal pain, nausea and vomiting.  Musculoskeletal: Negative for neck pain and neck stiffness.  Skin: Negative for rash.  Neurological: Negative for syncope.  Psychiatric/Behavioral: Negative for confusion. The patient is nervous/anxious.   All other systems reviewed and are negative.    Physical Exam Updated Vital Signs BP  116/92   Pulse 112   Temp 98.1 F (36.7 C) (Oral)   Resp (!) 30   Ht 6' (1.829 m)   Wt 95.3 kg   SpO2 95%   BMI 28.48 kg/m   Physical Exam  Constitutional: He appears well-developed and well-nourished. No distress.  HENT:  Head:  Normocephalic.  Eyes: Conjunctivae and EOM are normal.  Neck: Neck supple.  Cardiovascular: Regular rhythm and intact distal pulses.  Tachycardia present.   Pulmonary/Chest: Accessory muscle usage present. He has decreased breath sounds. He has wheezes. He has no rales.  Musculoskeletal: Normal range of motion. He exhibits no edema.  Neurological: He is alert.  Skin: Skin is warm and dry.  Psychiatric: His speech is normal. His mood appears anxious.  Nursing note and vitals reviewed.    ED Treatments / Results   DIAGNOSTIC STUDIES: Oxygen Saturation is 96% on RA, adeuqate by my interpretation.    COORDINATION OF CARE: 6:36 PM Discussed treatment plan with pt at bedside which includes CXR, Xanax and pt agreed to plan.    Labs (all labs ordered are listed, but only abnormal results are displayed) Labs Reviewed  BASIC METABOLIC PANEL - Abnormal; Notable for the following:       Result Value   CO2 20 (*)    Glucose, Bld 147 (*)    All other components within normal limits  CBC WITH DIFFERENTIAL/PLATELET  URINALYSIS, ROUTINE W REFLEX MICROSCOPIC  I-STAT ARTERIAL BLOOD GAS, ED    Radiology Dg Chest Port 1 View  Result Date: 11/03/2016 CLINICAL DATA:  Shortness of breath, COPD EXAM: PORTABLE CHEST 1 VIEW COMPARISON:  08/21/2016 FINDINGS: Cardiomediastinal silhouette is stable. Mild hyperinflation. No infiltrate or pulmonary edema. IMPRESSION: No active disease. Electronically Signed   By: Lahoma Crocker M.D.   On: 11/03/2016 19:49    Procedures Procedures (including critical care time)  Medications Ordered in ED Medications  albuterol (PROVENTIL,VENTOLIN) solution continuous neb (10 mg/hr Nebulization New Bag/Given 11/03/16 1918)  ondansetron (ZOFRAN) injection 4 mg (not administered)  albuterol (PROVENTIL) (2.5 MG/3ML) 0.083% nebulizer solution (2.5 mg  Given 11/03/16 1835)  ipratropium-albuterol (DUONEB) 0.5-2.5 (3) MG/3ML nebulizer solution (3 mLs  Given 11/03/16 1835)    ALPRAZolam (XANAX) tablet 1 mg (1 mg Oral Given 11/03/16 1907)  methylPREDNISolone sodium succinate (SOLU-MEDROL) 125 mg/2 mL injection 125 mg (125 mg Intravenous Given 11/03/16 2034)     Initial Impression / Assessment and Plan / ED Course  I have reviewed the triage vital signs and the nursing notes.  Pertinent labs & imaging results that were available during my care of the patient were reviewed by me and considered in my medical decision making (see chart for details).    Final Clinical Impressions(s) / ED Diagnoses  66 y.o. male with shortness of breath and anxiety stable for transfer to [facility] for admission. Discussed with Dr. Eulas Post and she will accept the patient for transfer.  Will admit for observation.  Final diagnoses:  Shortness of breath  Moderate persistent asthma with exacerbation    New Prescriptions New Prescriptions   No medications on file   I personally performed the services described in this documentation, which was scribed in my presence. The recorded information has been reviewed and is accurate.    [address] [place], [place] 11/03/16 [place], MD 11/04/16 0001

## 2016-11-04 DIAGNOSIS — R079 Chest pain, unspecified: Secondary | ICD-10-CM

## 2016-11-04 DIAGNOSIS — G4733 Obstructive sleep apnea (adult) (pediatric): Secondary | ICD-10-CM | POA: Diagnosis present

## 2016-11-04 DIAGNOSIS — G14 Postpolio syndrome: Secondary | ICD-10-CM | POA: Diagnosis present

## 2016-11-04 DIAGNOSIS — Z8249 Family history of ischemic heart disease and other diseases of the circulatory system: Secondary | ICD-10-CM | POA: Diagnosis not present

## 2016-11-04 DIAGNOSIS — Z811 Family history of alcohol abuse and dependence: Secondary | ICD-10-CM | POA: Diagnosis not present

## 2016-11-04 DIAGNOSIS — Z8611 Personal history of tuberculosis: Secondary | ICD-10-CM | POA: Diagnosis not present

## 2016-11-04 DIAGNOSIS — E785 Hyperlipidemia, unspecified: Secondary | ICD-10-CM | POA: Diagnosis present

## 2016-11-04 DIAGNOSIS — Z800 Family history of malignant neoplasm of digestive organs: Secondary | ICD-10-CM | POA: Diagnosis not present

## 2016-11-04 DIAGNOSIS — Z7951 Long term (current) use of inhaled steroids: Secondary | ICD-10-CM | POA: Diagnosis not present

## 2016-11-04 DIAGNOSIS — I10 Essential (primary) hypertension: Secondary | ICD-10-CM

## 2016-11-04 DIAGNOSIS — E1165 Type 2 diabetes mellitus with hyperglycemia: Secondary | ICD-10-CM | POA: Diagnosis present

## 2016-11-04 DIAGNOSIS — J45901 Unspecified asthma with (acute) exacerbation: Secondary | ICD-10-CM

## 2016-11-04 DIAGNOSIS — Z820 Family history of epilepsy and other diseases of the nervous system: Secondary | ICD-10-CM | POA: Diagnosis not present

## 2016-11-04 DIAGNOSIS — E119 Type 2 diabetes mellitus without complications: Secondary | ICD-10-CM

## 2016-11-04 DIAGNOSIS — J4541 Moderate persistent asthma with (acute) exacerbation: Secondary | ICD-10-CM | POA: Diagnosis present

## 2016-11-04 DIAGNOSIS — R0602 Shortness of breath: Secondary | ICD-10-CM | POA: Diagnosis not present

## 2016-11-04 DIAGNOSIS — Z885 Allergy status to narcotic agent status: Secondary | ICD-10-CM | POA: Diagnosis not present

## 2016-11-04 DIAGNOSIS — J441 Chronic obstructive pulmonary disease with (acute) exacerbation: Secondary | ICD-10-CM | POA: Diagnosis not present

## 2016-11-04 DIAGNOSIS — F419 Anxiety disorder, unspecified: Secondary | ICD-10-CM | POA: Diagnosis not present

## 2016-11-04 DIAGNOSIS — K219 Gastro-esophageal reflux disease without esophagitis: Secondary | ICD-10-CM | POA: Diagnosis present

## 2016-11-04 DIAGNOSIS — R634 Abnormal weight loss: Secondary | ICD-10-CM | POA: Diagnosis present

## 2016-11-04 DIAGNOSIS — Z79899 Other long term (current) drug therapy: Secondary | ICD-10-CM | POA: Diagnosis not present

## 2016-11-04 DIAGNOSIS — Z833 Family history of diabetes mellitus: Secondary | ICD-10-CM | POA: Diagnosis not present

## 2016-11-04 DIAGNOSIS — F319 Bipolar disorder, unspecified: Secondary | ICD-10-CM | POA: Diagnosis present

## 2016-11-04 DIAGNOSIS — Z818 Family history of other mental and behavioral disorders: Secondary | ICD-10-CM | POA: Diagnosis not present

## 2016-11-04 DIAGNOSIS — Z8546 Personal history of malignant neoplasm of prostate: Secondary | ICD-10-CM | POA: Diagnosis not present

## 2016-11-04 DIAGNOSIS — R531 Weakness: Secondary | ICD-10-CM | POA: Diagnosis not present

## 2016-11-04 DIAGNOSIS — T380X5A Adverse effect of glucocorticoids and synthetic analogues, initial encounter: Secondary | ICD-10-CM | POA: Diagnosis present

## 2016-11-04 DIAGNOSIS — Z87891 Personal history of nicotine dependence: Secondary | ICD-10-CM | POA: Diagnosis not present

## 2016-11-04 DIAGNOSIS — Z9119 Patient's noncompliance with other medical treatment and regimen: Secondary | ICD-10-CM

## 2016-11-04 DIAGNOSIS — Y92230 Patient room in hospital as the place of occurrence of the external cause: Secondary | ICD-10-CM | POA: Diagnosis present

## 2016-11-04 LAB — GLUCOSE, CAPILLARY
GLUCOSE-CAPILLARY: 198 mg/dL — AB (ref 65–99)
GLUCOSE-CAPILLARY: 215 mg/dL — AB (ref 65–99)
Glucose-Capillary: 223 mg/dL — ABNORMAL HIGH (ref 65–99)

## 2016-11-04 LAB — CBC WITH DIFFERENTIAL/PLATELET
BASOS ABS: 0 10*3/uL (ref 0.0–0.1)
Basophils Relative: 0 %
Eosinophils Absolute: 0 10*3/uL (ref 0.0–0.7)
Eosinophils Relative: 0 %
HEMATOCRIT: 42.1 % (ref 39.0–52.0)
HEMOGLOBIN: 14.5 g/dL (ref 13.0–17.0)
LYMPHS ABS: 0.5 10*3/uL — AB (ref 0.7–4.0)
LYMPHS PCT: 7 %
MCH: 31.7 pg (ref 26.0–34.0)
MCHC: 34.4 g/dL (ref 30.0–36.0)
MCV: 92.1 fL (ref 78.0–100.0)
Monocytes Absolute: 0 10*3/uL — ABNORMAL LOW (ref 0.1–1.0)
Monocytes Relative: 0 %
NEUTROS ABS: 6.6 10*3/uL (ref 1.7–7.7)
Neutrophils Relative %: 93 %
Platelets: 219 10*3/uL (ref 150–400)
RBC: 4.57 MIL/uL (ref 4.22–5.81)
RDW: 13.7 % (ref 11.5–15.5)
WBC: 7.1 10*3/uL (ref 4.0–10.5)

## 2016-11-04 LAB — INFLUENZA PANEL BY PCR (TYPE A & B)
INFLBPCR: NEGATIVE
Influenza A By PCR: NEGATIVE

## 2016-11-04 LAB — BASIC METABOLIC PANEL
ANION GAP: 11 (ref 5–15)
BUN: 11 mg/dL (ref 6–20)
CHLORIDE: 103 mmol/L (ref 101–111)
CO2: 20 mmol/L — AB (ref 22–32)
Calcium: 9.1 mg/dL (ref 8.9–10.3)
Creatinine, Ser: 1.19 mg/dL (ref 0.61–1.24)
GFR calc Af Amer: >60 mL/min (ref >60)
GLUCOSE: 200 mg/dL — AB (ref 65–99)
POTASSIUM: 4.6 mmol/L (ref 3.5–5.1)
Sodium: 134 mmol/L — ABNORMAL LOW (ref 135–145)

## 2016-11-04 LAB — SEDIMENTATION RATE: SED RATE: 5 mm/h (ref 0–16)

## 2016-11-04 LAB — TSH: TSH: 0.361 u[IU]/mL (ref 0.350–4.500)

## 2016-11-04 LAB — TROPONIN I
Troponin I: <0.03 ng/mL (ref <0.03)
Troponin I: <0.03 ng/mL (ref <0.03)

## 2016-11-04 LAB — PHOSPHORUS: Phosphorus: 2.7 mg/dL (ref 2.5–4.6)

## 2016-11-04 MED ORDER — ARFORMOTEROL TARTRATE 15 MCG/2ML IN NEBU
15.0000 ug | INHALATION_SOLUTION | Freq: Two times a day (BID) | RESPIRATORY_TRACT | Status: DC
  Administered 2016-11-04 – 2016-11-06 (×5): 15 ug via RESPIRATORY_TRACT
  Filled 2016-11-04 (×5): qty 2

## 2016-11-04 MED ORDER — BUDESONIDE 0.5 MG/2ML IN SUSP
0.5000 mg | Freq: Two times a day (BID) | RESPIRATORY_TRACT | Status: DC
  Administered 2016-11-04 – 2016-11-06 (×5): 0.5 mg via RESPIRATORY_TRACT
  Filled 2016-11-04 (×5): qty 2

## 2016-11-04 MED ORDER — MAGNESIUM SULFATE 2 GM/50ML IV SOLN
2.0000 g | Freq: Once | INTRAVENOUS | Status: AC
  Administered 2016-11-04: 2 g via INTRAVENOUS
  Filled 2016-11-04: qty 50

## 2016-11-04 MED ORDER — NITROGLYCERIN 0.4 MG SL SUBL: 0.4000 mg | SUBLINGUAL_TABLET | SUBLINGUAL | Status: DC | PRN

## 2016-11-04 MED ORDER — ALPRAZOLAM 0.5 MG PO TABS
0.5000 mg | ORAL_TABLET | Freq: Two times a day (BID) | ORAL | Status: DC | PRN
Start: 2016-11-04 — End: 2016-11-04

## 2016-11-04 MED ORDER — METOPROLOL SUCCINATE ER 25 MG PO TB24
25.0000 mg | ORAL_TABLET | Freq: Every day | ORAL | Status: DC
  Administered 2016-11-04: 25 mg via ORAL
  Filled 2016-11-04: qty 1

## 2016-11-04 MED ORDER — BISOPROLOL FUMARATE 5 MG PO TABS
5.0000 mg | ORAL_TABLET | Freq: Every day | ORAL | Status: DC
  Administered 2016-11-05 – 2016-11-06 (×2): 5 mg via ORAL
  Filled 2016-11-04 (×2): qty 1

## 2016-11-04 MED ORDER — ACETAMINOPHEN 325 MG PO TABS: 650.0000 mg | ORAL_TABLET | ORAL | Status: DC | PRN

## 2016-11-04 MED ORDER — DEXTROSE 5 % IV SOLN
500.0000 mg | Freq: Every day | INTRAVENOUS | Status: DC
  Administered 2016-11-04 – 2016-11-05 (×2): 500 mg via INTRAVENOUS
  Filled 2016-11-04 (×2): qty 500

## 2016-11-04 MED ORDER — PANTOPRAZOLE SODIUM 40 MG PO TBEC
40.0000 mg | DELAYED_RELEASE_TABLET | Freq: Two times a day (BID) | ORAL | Status: DC
  Administered 2016-11-04 – 2016-11-06 (×5): 40 mg via ORAL
  Filled 2016-11-04 (×5): qty 1

## 2016-11-04 MED ORDER — METHYLPREDNISOLONE SODIUM SUCC 125 MG IJ SOLR
60.0000 mg | Freq: Three times a day (TID) | INTRAMUSCULAR | Status: DC
  Administered 2016-11-04 – 2016-11-06 (×7): 60 mg via INTRAVENOUS
  Filled 2016-11-04 (×7): qty 2

## 2016-11-04 MED ORDER — IPRATROPIUM-ALBUTEROL 0.5-2.5 (3) MG/3ML IN SOLN: 3.0000 mL | RESPIRATORY_TRACT | Status: DC | PRN

## 2016-11-04 MED ORDER — ALPRAZOLAM 0.5 MG PO TABS
0.5000 mg | ORAL_TABLET | Freq: Two times a day (BID) | ORAL | Status: DC | PRN
  Administered 2016-11-04 – 2016-11-05 (×2): 1 mg via ORAL
  Filled 2016-11-04 (×2): qty 2

## 2016-11-04 MED ORDER — ATORVASTATIN CALCIUM 40 MG PO TABS
40.0000 mg | ORAL_TABLET | Freq: Every day | ORAL | Status: DC
  Administered 2016-11-04 – 2016-11-05 (×2): 40 mg via ORAL
  Filled 2016-11-04 (×2): qty 1

## 2016-11-04 MED ORDER — ENOXAPARIN SODIUM 40 MG/0.4ML ~~LOC~~ SOLN
40.0000 mg | SUBCUTANEOUS | Status: DC
  Administered 2016-11-04 – 2016-11-06 (×3): 40 mg via SUBCUTANEOUS
  Filled 2016-11-04 (×4): qty 0.4

## 2016-11-04 MED ORDER — ONDANSETRON HCL 4 MG/2ML IJ SOLN
4.0000 mg | Freq: Four times a day (QID) | INTRAMUSCULAR | Status: DC | PRN
  Administered 2016-11-06: 4 mg via INTRAVENOUS
  Filled 2016-11-04: qty 2

## 2016-11-04 MED ORDER — ALPRAZOLAM 1 MG PO TABS
1.0000 mg | ORAL_TABLET | Freq: Once | ORAL | Status: AC
  Administered 2016-11-04: 1 mg via ORAL
  Filled 2016-11-04: qty 1

## 2016-11-04 MED ORDER — GI COCKTAIL ~~LOC~~: 30.0000 mL | Freq: Four times a day (QID) | ORAL | Status: DC | PRN

## 2016-11-04 MED ORDER — MONTELUKAST SODIUM 10 MG PO TABS
10.0000 mg | ORAL_TABLET | Freq: Every day | ORAL | Status: DC
  Administered 2016-11-04 – 2016-11-05 (×2): 10 mg via ORAL
  Filled 2016-11-04 (×2): qty 1

## 2016-11-04 MED ORDER — HYDROCODONE-ACETAMINOPHEN 5-325 MG PO TABS
1.0000 | ORAL_TABLET | ORAL | Status: DC | PRN
  Administered 2016-11-05 (×2): 1 via ORAL
  Filled 2016-11-04 (×2): qty 1

## 2016-11-04 MED ORDER — IPRATROPIUM-ALBUTEROL 0.5-2.5 (3) MG/3ML IN SOLN
3.0000 mL | Freq: Four times a day (QID) | RESPIRATORY_TRACT | Status: DC
  Administered 2016-11-04 – 2016-11-06 (×9): 3 mL via RESPIRATORY_TRACT
  Filled 2016-11-04 (×9): qty 3

## 2016-11-04 MED ORDER — INSULIN ASPART 100 UNIT/ML ~~LOC~~ SOLN
0.0000 [IU] | Freq: Three times a day (TID) | SUBCUTANEOUS | Status: DC
  Administered 2016-11-04 (×2): 2 [IU] via SUBCUTANEOUS
  Administered 2016-11-04: 3 [IU] via SUBCUTANEOUS
  Administered 2016-11-05: 5 [IU] via SUBCUTANEOUS
  Administered 2016-11-05 – 2016-11-06 (×3): 2 [IU] via SUBCUTANEOUS

## 2016-11-04 NOTE — Progress Notes (Signed)
Pt transferred from [place] after arriving from [facility]. Pt A&Ox4. Right FA IV intact, site WNL. Pt oriented to room, call bell, bed use and welcome packet. Encouraged to call for assistance. Will c/t monitor.

## 2016-11-04 NOTE — Progress Notes (Addendum)
Patient ID: [name], male   DOB: [birth year], 66 y.o.   MRN: [id number]  Remote Floor Coverage.  The patient was complaining of anxiety and CP after arriving from [facility]. He requested alprazolam and SL NTG, which were ordered. Magnesium sulfate 2 g IVPB, cardiac monitoring, EKG, phosphorus and troponin level were ordered. Floor manager notified of possible change bed status to telemetry or SDU. Dr. Tamala Julian will be evaluating and admitting the patient.  Tennis Must, MD [phone].

## 2016-11-04 NOTE — Progress Notes (Addendum)
PROGRESS NOTE                                                                                                                                                                                                             Patient Demographics:    [name], is a 66 y.o. male, DOB - 23-Feb-1951, [id number]  Admit date - 11/03/2016   Admitting Physician Norval Morton, MD  Outpatient Primary MD for the patient is Jonathon Martinique, MD  LOS - 0  Outpatient Specialists: Pulmonary Dr Elsworth Soho  Chief Complaint  Patient presents with  . Shortness of Breath       Brief Narrative    Patient  admitted earlier during the day by Dr. Tamala Julian, chart, labs, imaging were reviewed 66 y.o. male with complicated medical history significant of HTN, HLD, COPD/asthma, PVCs, prostate cancer, bipolar disorder, anxiety, GERD, tobacco abuse, alcohol abuse, and multiple medication/envioromental allergies; presents with complaints of 2 weeks of gradually worsening shortness of breath, admitted for COPD exacerbation    Subjective:    [name] today has, No headache, No chest pain, No abdominal pain - Reports shortness of breath and cough  Assessment  & Plan :    Principal Problem:   COPD exacerbation ([place]) Active Problems:   Hypertension   Anxiety   Diabetes mellitus without complication- (diet controlled; w/o proteinuria)   Patient's noncompliance with other medical treatment and regimen   non-specific Chest pain   Asthma exacerbation   COPD exacerbation acute:  - Patient presents with complaints of shortness of breath. Found to have bilateral wheezing on exam. Chest x-ray showing hyperinflation without acute infiltrate.  - A shunt has been recently on prolonged prednisone taper,, will start on IV Solu-Medrol. - DuoNeb's qid and prn  - Budesonide and Brovana neb - Azithromycin IV - Continue Singulair - Question use of Afrin as possible trigger for patient's  symptoms  Chest pain: No aspirin given as patient has allergy - Trend cardiac enzymes - Nitroglycerin prn - check UDS  Essential hypertension - Change metoprolol to bisoprolol  Anxiety/bipolar disorder - Continue Xanax, but question frequency at which the patient was taking this medicine as it appears that he will adjust dosages medicines as needed.  - Patient reports not being on Abilify question if patient self stopped this medication.  Hyperlipidemia - Continue atorvastatin  Weight loss: Acute patient reports a 12 pound weight loss over the last week with decreased appetite. - Check TSH and ESR - Given patient's acute wheezing and respiratory issues may consider need a CT scan of chest if not improving.  History of prostate cancer: Patient reports being followed by urology the outpatient setting for this.  Diabetes mellitus type 2, report to be diet controlled. Last hemoglobin A1c was noted to be 7.9 back on 04/2016. - Continue to monitor CBGs and added on sensitive sliding scale insulin if needed    Code Status : Full  Family Communication  : None at bedside  Disposition Plan  : home when stable  Consults  :  none  Procedures  : None  DVT Prophylaxis  :  Lovenox -SCDs   Lab Results  Component Value Date   PLT 219 11/04/2016    Antibiotics  :    Anti-infectives    Start     Dose/Rate Route Frequency Ordered Stop   11/04/16 0445  azithromycin (ZITHROMAX) 500 mg in dextrose 5 % 250 mL IVPB     500 mg 250 mL/hr over 60 Minutes Intravenous Daily 11/04/16 0437          Objective:   Vitals:   11/04/16 0145 11/04/16 0300 11/04/16 0618 11/04/16 0908  BP: 118/65 128/78  129/64  Pulse: 105 99  90  Resp: 21 18    Temp:  97.3 F (36.3 C)    TempSrc:  Oral    SpO2: 91% 96% 97%   Weight:      Height:        Wt Readings from Last 3 Encounters:  11/03/16 95.3 kg (210 lb)  10/29/16 96.5 kg (212 lb 11.2 oz)  10/02/16 98.8 kg (217 lb 12.8 oz)      Intake/Output Summary (Last 24 hours) at 11/04/16 1454 Last data filed at 11/04/16 1300  Gross per 24 hour  Intake              590 ml  Output                0 ml  Net              590 ml     Physical Exam  Awake Alert, Oriented X 3, No new F.N deficits, Normal affect [place].AT,PERRAL Supple Neck,No JVD, No cervical lymphadenopathy appriciated.  Symmetrical Chest wall movement, Good air movement bilaterally, Scattered wheezing RRR,No Gallops,Rubs or new Murmurs, No Parasternal Heave +ve B.Sounds, Abd Soft, No tenderness, No rebound - guarding or rigidity. No Cyanosis, Clubbing or edema, No new Rash or bruise     Data Review:    CBC  Recent Labs Lab 11/03/16 2220 11/04/16 0624  WBC 6.0 7.1  HGB 15.5 14.5  HCT 45.1 42.1  PLT 204 219  MCV 91.7 92.1  MCH 31.5 31.7  MCHC 34.4 34.4  RDW 13.3 13.7  LYMPHSABS 0.7 0.5*  MONOABS 0.1 0.0*  EOSABS 0.1 0.0  BASOSABS 0.0 0.0    Chemistries   Recent Labs Lab 11/03/16 2220 11/04/16 0624  NA 135 134*  K 3.9 4.6  CL 104 103  CO2 20* 20*  GLUCOSE 147* 200*  BUN 8 11  CREATININE 1.08 1.19  CALCIUM 9.4 9.1   ------------------------------------------------------------------------------------------------------------------ No results for input(s): CHOL, HDL, LDLCALC, TRIG, CHOLHDL, LDLDIRECT in the last 72 hours.  Lab Results  Component Value Date   HGBA1C 5.9 04/30/2016   ------------------------------------------------------------------------------------------------------------------  Recent Labs  11/04/16 9242  TSH 0.361   ------------------------------------------------------------------------------------------------------------------ No results for input(s): VITAMINB12, FOLATE, FERRITIN, TIBC, IRON, RETICCTPCT in the last 72 hours.  Coagulation profile No results for input(s): INR, PROTIME in the last 168 hours.  No results for input(s): DDIMER in the last 72 hours.  Cardiac Enzymes  Recent  Labs Lab 11/04/16 0624 11/04/16 1153  TROPONINI <0.03 <0.03   ------------------------------------------------------------------------------------------------------------------ No results found for: BNP  Inpatient Medications  Scheduled Meds: . arformoterol  15 mcg Nebulization BID  . atorvastatin  40 mg Oral QHS  . azithromycin  500 mg Intravenous Q0600  . budesonide (PULMICORT) nebulizer solution  0.5 mg Nebulization BID  . enoxaparin (LOVENOX) injection  40 mg Subcutaneous Q24H  . insulin aspart  0-9 Units Subcutaneous TID WC  . ipratropium-albuterol  3 mL Nebulization QID  . methylPREDNISolone (SOLU-MEDROL) injection  60 mg Intravenous Q8H  . montelukast  10 mg Oral QHS  . pantoprazole  40 mg Oral BID   Continuous Infusions: . albuterol 10 mg/hr (11/03/16 1918)   PRN Meds:.acetaminophen, ALPRAZolam, gi cocktail, HYDROcodone-acetaminophen, ipratropium-albuterol, nitroGLYCERIN, ondansetron (ZOFRAN) IV  Micro Results No results found for this or any previous visit (from the past 240 hour(s)).  Radiology Reports Dg Chest Port 1 View  Result Date: 11/03/2016 CLINICAL DATA:  Shortness of breath, COPD EXAM: PORTABLE CHEST 1 VIEW COMPARISON:  08/21/2016 FINDINGS: Cardiomediastinal silhouette is stable. Mild hyperinflation. No infiltrate or pulmonary edema. IMPRESSION: No active disease. Electronically Signed   By: Lahoma Crocker M.D.   On: 11/03/2016 19:49     Cachet Mccutchen M.D on 11/04/2016 at 2:54 PM  Between 7am to 7pm - Pager - [phone]  After 7pm go to [email or web address] - password [facility]  [facility] -  Office  [phone]

## 2016-11-04 NOTE — Evaluation (Signed)
Physical Therapy Evaluation Patient Details Name: [name] MRN: [id number] DOB: 11/16/1950 Today's Date: 11/04/2016   History of Present Illness  [name] is a 66 y.o. male with complicated medical history significant of HTN, HLD, COPD/asthma, PVCs, prostate cancer, bipolar disorder, anxiety, GERD, tobacco abuse, alcohol abuse, and multiple medication/envioromental allergies; presents with complaints of 2 weeks of gradually worsening shortness of breath and chest pain.  Clinical Impression  The patient ambulated on RA x 250'. Lowest oxygen  Saturation 89%. No PT needs at this time. Will sign off.   Follow Up Recommendations No PT follow up    Equipment Recommendations  None recommended by PT    Recommendations for Other Services       Precautions / Restrictions Precautions Precautions: None      Mobility  Bed Mobility Overal bed mobility: Independent                Transfers Overall transfer level: Independent                  Ambulation/Gait Ambulation/Gait assistance: Independent Ambulation Distance (Feet): 250 Feet Assistive device: None     Gait velocity interpretation: at or above normal speed for age/gender General Gait Details: pushed IV pole at times, no balance losses.  Stairs            Wheelchair Mobility    Modified Rankin (Stroke Patients Only)       Balance Overall balance assessment: Independent                                           Pertinent Vitals/Pain Pain Assessment: No/denies pain    Home Living Family/patient expects to be discharged to:: Private residence Living Arrangements: Spouse/significant other Available Help at Discharge: Family Type of Home: House Home Access: Stairs to enter Entrance Stairs-Rails: Psychiatric nurse of Steps: Soldier: One level   Additional Comments: oxygen, works on a farm with cows    Prior Function Level of Independence:  Independent               Journalist, newspaper        Extremity/Trunk Assessment   Upper Extremity Assessment Upper Extremity Assessment: Overall WFL for tasks assessed    Lower Extremity Assessment Lower Extremity Assessment: Overall WFL for tasks assessed    Cervical / Trunk Assessment Cervical / Trunk Assessment: Normal  Communication   Communication: No difficulties  Cognition Arousal/Alertness: Awake/alert Behavior During Therapy: WFL for tasks assessed/performed Overall Cognitive Status: Within Functional Limits for tasks assessed                      General Comments      Exercises     Assessment/Plan    PT Assessment Patent does not need any further PT services  PT Problem List            PT Treatment Interventions      PT Goals (Current goals can be found in the Care Plan section)  Acute Rehab PT Goals Patient Stated Goal: to go home PT Goal Formulation: All assessment and education complete, DC therapy    Frequency     Barriers to discharge        Co-evaluation               End of Session   Activity Tolerance:  Patient tolerated treatment well Patient left: in bed;with call bell/phone within reach;with bed alarm set Nurse Communication: Mobility status    Functional Assessment Tool Used: clinical judgement Functional Limitation: Mobility: Walking and moving around Mobility: Walking and Moving Around Current Status [phone]): 0 percent impaired, limited or restricted Mobility: Walking and Moving Around Goal Status [phone]): 0 percent impaired, limited or restricted Mobility: Walking and Moving Around Discharge Status [phone]): 0 percent impaired, limited or restricted    Time: 1059-1116 PT Time Calculation (min) (ACUTE ONLY): 17 min   Charges:   PT Evaluation $PT Eval Low Complexity: 1 Procedure     PT G Codes:   PT G-Codes **NOT FOR INPATIENT CLASS** Functional Assessment Tool Used: clinical judgement Functional  Limitation: Mobility: Walking and moving around Mobility: Walking and Moving Around Current Status [id number]): 0 percent impaired, limited or restricted Mobility: Walking and Moving Around Goal Status [phone]): 0 percent impaired, limited or restricted Mobility: Walking and Moving Around Discharge Status [phone]): 0 percent impaired, limited or restricted    [name] 11/04/2016, 1:28 PM Tresa Endo PT [phone]

## 2016-11-04 NOTE — Progress Notes (Signed)
Patient accepted for observation status for further management of acute asthma exacerbation.  URI symptoms and increased wheezing.  Also admits to increased stress and anxiety.  S/P IV solumedrol 125mg  x one, xanax 1mg  po x one, and continuous albuterol nebulizer in the ED.  Consider magnesium upon arrival if he still has significant symptoms.

## 2016-11-04 NOTE — H&P (Signed)
History and Physical    [name] UDJ:[id number] DOB: Nov 18, 1950 DOA: 11/03/2016  Referring MD/NP/PA: Dr. Eulas Post PCP: Betty Martinique, MD  Patient coming from: Transfer from [facility].  Chief Complaint: Couldn't breathe  HPI: [name] is a 66 y.o. male with complicated medical history significant of HTN, HLD, COPD/asthma, PVCs, prostate cancer, bipolar disorder, anxiety, GERD, tobacco abuse, alcohol abuse, and multiple medication/envioromental allergies; presents with complaints of 2 weeks of gradually worsening shortness of breath. Reported associated symptoms of a minimally productive cough, wheezing, chest pain, chest tightness, weight loss of approximately 12 pounds in the last week, and decreased appetite. Patient was recently seen by Dr. Elsworth Soho one month ago for which he reports being stopped on Breo and placed on Perforomist and budesonide. 11 days ago the patient reports being not nauseated after taking the Performist with increased wheezing for which she was prescribed Brovana. Patient noted some mild improvement with the budesonide of his symptoms, but was unable to obtain the newly prescribed Brovana. He reports that he's been in the process of trying to obtain this will last week or more, but still has not been able to get the prescription filled through his pharmacy as is not on the preferred list. He has been taking Mucinex without improvement to cough anything up. Patient admits to self prescribing 20 mg of prednisone daily for the last 4 days, use of codeine cough syrup, and additional doses of prescribed inhalers/nebulized treatments. During this 2 week period he had also reported intermittent sharp left-sided chest pains that usually only last a few seconds and can occur with certain movements. 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 For review records it appears that the patient has been evaluated with 2-D echo back  in 06/2016 which showed EF of 55-60% with normal LV function. Then patient had a negative myocardial perfusion study in 07/2016, and wore a 30 day Holter monitor in 08/2016 which does not appear to show any acute events. He denies smoking and reports quitting 3 years ago. He reports only intermittent alcohol use of 1-3 beers. Patient denies any fever, abdominal pain, nausea, vomiting, recent sick contacts, travel, leg swelling, or calf pain.   ED Course: Patient was initially seen at [facility]  be tachycardic and tachypneic with bilateral wheezing.  S/P IV solumedrol 128m x one, xanax 138mpo x one, and continuous albuterol nebulizer, and started on magnesium sulfate. Patient was initially admitted to a MedSurg bed but had to be switched to a telemetry bed due to complaints of chest pain on arrival after trying to remove his socks. EKG shows sinus tachycardia without any acute changes from previous tracings. Troponins ordered.  Review of Systems: As per HPI otherwise 10 point review of systems negative.   Past Medical History:  Diagnosis Date  . Anxiety   . Anxiety disorder   . Arthritis    BILATERAL SHOULDERS, ELBOWS AND HANDS AND LEFT HIP AND KNEES--HX CORTISONE SHOTS IN SHOULDERS, ELBOWS, HIP AND KNEES  . Basal cell carcinoma of back 04/30/2016   Dermatologist- [name]  MOHS sx- [name] . Bladder outlet obstruction   . Chronic idiopathic constipation 05/08/2015  . Complication of anesthesia    DIFFICULT WAKING   . COPD (chronic obstructive pulmonary disease) Gold C Frequent exacerbations 01/13/2014   SpArlyce Harman/24/15: FeV1 51% FeV1/FVC 66% FVC 59% 10/5/2015ONO RA was normal  10/13/2014  ONO on RA NORMAL   .  COPD, frequent exacerbations ([place])    pulmologist-  dr Joya Gaskins--  Girtha Rm Stage C.04-25-15 recent COPD exacerbation-much improved now, after tx. in ER [facility].  Marland Kitchen Depression   . Diabetes mellitus without complication ([place])    BODERLINE - DIET CONTROL  . Dysrhythmia    PVC'S  . Family  history of adverse reaction to anesthesia    father would wake up with agitation   . Former smoker 01/13/2014  . Gastroesophageal reflux disease without esophagitis   . GERD (gastroesophageal reflux disease)   . Heavy alcohol use 04/30/2016  . History of chronic bronchitis   . History of oxygen administration    oxygen use 2 l/m nasally at bedtime and exertional occasions  . History of rheumatic fever   . History of TB (tuberculosis)    1984--  hospitalized for 4 month treatment  . History of urinary retention   . Hx of multiple concussions    x 2 per patient   . Hypertension   . Melanoma ([place])   . Nocturnal oxygen desaturation    USES O2 NIGHTLY  . OSA (obstructive sleep apnea) 01/13/2014  . Pneumonia    hx of   . PONV (postoperative nausea and vomiting)   . Post-polio syndrome    polio at age 79--PT WAS IN IRON LUNG; PT WAS IN W/C UNTIL AGE 35; STILL HAS WEAKNESS RIGHT SIDE  . Prostate cancer ([place])   . Prostate cancer ([place])   . Schizophrenia ([place])   . Shortness of breath dyspnea    RIGHT HEMIDIAPHRAGM ELEVATION - POST POLIO SYNDROME  . Tuberculosis    Hosp 4 months rx , left early     Past Surgical History:  Procedure Laterality Date  . CARDIOVASCULAR STRESS TEST  06-08-2014  dr Mare Ferrari   normal lexiscan study/  no ischemia/  not gated due to PAC's  . COLONOSCOPY N/A 05/03/2015   Procedure: COLONOSCOPY;  Surgeon: [name], MD;  Location: [facility];  Service: Endoscopy;  Laterality: N/A;  . CYSTOSCOPY N/A 10/25/2015   Procedure: CYSTOSCOPY;  Surgeon: [name], MD;  Location: [facility];  Service: Urology;  Laterality: N/A;  . CYSTOSCOPY W/ CYSTOGRAM/  TRANSRECTAL ULTRASOUND PROSTATE BX  03-22-2009  . ESOPHAGOGASTRODUODENOSCOPY N/A 03/22/2015   Procedure: ESOPHAGOGASTRODUODENOSCOPY (EGD) with dilation;  Surgeon: [name], MD;  Location: [facility];  Service: Endoscopy;  Laterality: N/A;  . excision of skin lesion    . LAPAROSCOPIC CHOLECYSTECTOMY  2013  . left elbow  surgery      due to fracture   . NASAL SEPTUM SURGERY  2000  . OTHER SURGICAL HISTORY      Muscle & bone Graft/Polio  . polio surgeries      14 polio surgeries   . PROSTATE BIOPSY N/A 09/28/2014   Procedure: PROSTATE ULTRASOUND/BIOPSY;  Surgeon: [name], MD;  Location: [facility];  Service: Urology;  Laterality: N/A;  . PROSTATE BIOPSY N/A 10/25/2015   Procedure: PROSTATE BIOPSY AND ULTRASOUND;  Surgeon: [name], MD;  Location: [facility];  Service: Urology;  Laterality: N/A;  . SAVORY DILATION N/A 03/22/2015   Procedure: SAVORY DILATION;  Surgeon: [name], MD;  Location: [facility];  Service: Endoscopy;  Laterality: N/A;  . SHOULDER ARTHROSCOPY WITH OPEN ROTATOR CUFF REPAIR Bilateral 2013  &  1999   removal spurs and bursectomy  . TRANSURETHRAL INCISION OF BLADDER NECK N/A 10/25/2015   Procedure:  TRANSURETHRAL INCISION OF BLADDER NECK;  Surgeon: [name], MD;  Location: [facility];  Service: Urology;  Laterality: N/A;  . TRANSURETHRAL RESECTION OF PROSTATE N/A 09/28/2014   Procedure: TRANSURETHRAL RESECTION OF THE PROSTATE (TURP);  Surgeon: [name], MD;  Location: [facility];  Service: Urology;  Laterality: N/A;  . URETEROSOPY STONE EXTRACTION  2000     reports that he quit smoking about 3 years ago. He quit after 47.00 years of use. He has quit using smokeless tobacco. He reports that he drinks about 3.6 oz of alcohol per week . He reports that he does not use drugs.  Allergies  Allergen Reactions  . Aspirin Anaphylaxis  . Hornet Venom Anaphylaxis  . Ivp Dye [Iodinated Diagnostic Agents] Anaphylaxis  . Levaquin [Levofloxacin In D5w] Shortness Of Breath and Swelling    In addition: sweating, chest pain, and diarrhea.   . Nsaids Anaphylaxis  . Penicillins Anaphylaxis    Heart stops Has patient had a PCN reaction causing immediate rash, facial/tongue/throat swelling, SOB or lightheadedness with hypotension: yes Has patient had a PCN reaction causing severe rash involving mucus  membranes or skin necrosis: yes Has patient had a PCN reaction that required hospitalization yes Has patient had a PCN reaction occurring within the last 10 years: No If all of the above answers are "NO", then may proceed with Cephalosporin use.   . Tolmetin Anaphylaxis  . Perforomist [Formoterol]     Increased wheezing, shortness of breath  . Buprenorphine Hcl Nausea And Vomiting    Can take with zofran   . Morphine And Related Nausea And Vomiting    Can take with zofran   . Oxycodone Itching and Rash    Family History  Problem Relation Age of Onset  . Alzheimer's disease Father 4    Deceased  . Stomach cancer Father   . Heart attack Father   . Heart disease Father   . Skin cancer Mother     Facial-Living  . Cancer Mother     skin  . Alcohol abuse Sister     x2  . Mental illness Sister     x2  . Alcohol abuse Sister   . Diabetes Maternal Aunt     x2  . Thyroid disease Maternal Aunt     x4  . Diabetes Maternal Uncle   . Tuberculosis Paternal Grandfather   . Tuberculosis Paternal Grandmother   . Alzheimer's disease Paternal Aunt   . Alzheimer's disease Paternal Uncle   . Colon cancer Neg Hx   . Colon polyps Neg Hx   . Crohn's disease Neg Hx   . Ulcerative colitis Neg Hx     Prior to Admission medications   Medication Sig Start Date End Date Taking? Authorizing Provider  acetaminophen (TYLENOL) 500 MG tablet Take 500 mg by mouth every 6 (six) hours as needed for headache (pain).    Historical Provider, MD  albuterol (PROVENTIL HFA;VENTOLIN HFA) 108 (90 Base) MCG/ACT inhaler Inhale 2 puffs into the lungs every 6 (six) hours as needed for wheezing or shortness of breath. 11/02/16   Rigoberto Noel, MD  ALPRAZolam [name]) 1 MG tablet Take 0.5 tablets (0.5 mg total) by mouth 2 (two) times daily as needed for anxiety. 10/29/16   Crosby Oyster Wendling, DO  arformoterol (BROVANA) 15 MCG/2ML NEBU Take 2 mLs (15 mcg total) by nebulization 2 (two) times daily. 10/24/16   Rigoberto Noel, MD  ARIPiprazole (ABILIFY) 5 MG tablet Take 1 tablet (5 mg total) by mouth daily. 10/29/16   Shelda Pal, DO  atorvastatin (  LIPITOR) 40 MG tablet One tab q hs Patient taking differently: Take 40 mg by mouth at bedtime.  05/28/16   [name], DO  Blood Glucose Monitoring Suppl (ACCU-CHEK AVIVA) device Use to check blood sugars every morning fasting, 2 hours after largest meal and as needed with low blood sugar symptoms 06/12/16 06/12/17  Deborah Opalski, DO  budesonide (PULMICORT) 0.25 MG/2ML nebulizer solution Take 2 mLs (0.25 mg total) by nebulization 2 (two) times daily. 09/18/16   Tammy S Parrett, NP  dexlansoprazole (DEXILANT) 60 MG capsule Take 1 capsule (60 mg total) by mouth daily. 09/17/16   Betty G Martinique, MD  EPINEPHrine 0.3 mg/0.3 mL IJ SOAJ injection Inject 0.3 mLs (0.3 mg total) into the muscle once. Patient taking differently: Inject 0.3 mg into the muscle once as needed (severe allergic reactions).  02/24/16   Malvin Johns, MD  fluticasone (FLONASE) 50 MCG/ACT nasal spray Place 2 sprays into both nostrils daily. Patient not taking: Reported on 10/29/2016 08/03/15   Brunetta Jeans, PA-C  fluticasone furoate-vilanterol (BREO ELLIPTA) 100-25 MCG/INH AEPB INHALE 1 PUFF BY MOUTH EVERY MORNING Patient taking differently: Inhale 1 puff into the lungs daily. INHALE 1 PUFF BY MOUTH EVERY MORNIN 06/29/16   Rigoberto Noel, MD  ipratropium-albuterol (DUONEB) 0.5-2.5 (3) MG/3ML SOLN Take 3 mLs by nebulization every 4 (four) hours as needed (shortness of breath and wheezing).    Historical Provider, MD  metoprolol succinate (TOPROL-XL) 25 MG 24 hr tablet Take 1 tablet (25 mg total) by mouth daily. 07/06/16   Deborah Opalski, DO  montelukast (SINGULAIR) 10 MG tablet TAKE 1 TABLET (10 MG TOTAL) BY MOUTH AT BEDTIME. Patient taking differently: Take 10 mg by mouth at bedtime as needed (seasonal allergies).  04/02/16   Brunetta Jeans, PA-C  nitroGLYCERIN (NITROSTAT) 0.4 MG SL tablet  Place 1 tablet (0.4 mg total) under the tongue every 5 (five) minutes as needed for chest pain. 06/21/15   Brunetta Jeans, PA-C  ondansetron (ZOFRAN) 4 MG tablet Take 1 tablet (4 mg total) by mouth every 12 (twelve) hours as needed for nausea or vomiting. 09/17/16   Betty G Martinique, MD  OXYGEN Inhale 2 L into the lungs at bedtime. Reported on 11/25/2015    Historical Provider, MD  oxymetazoline (AFRIN) 0.05 % nasal spray Place into the nose.    Historical Provider, MD  sodium phosphate (FLEET) 7-19 GM/118ML ENEM Place 1 enema rectally as needed for severe constipation.    Historical Provider, MD    Physical Exam:    Constitutional: Anxious and diaphoretic male who appears to be in some mild distress Vitals:   11/04/16 0115 11/04/16 0130 11/04/16 0145 11/04/16 0300  BP: (!) 133/54 118/62 118/65 128/78  Pulse: 109 106 105 99  Resp: '23 24 21 18  ' Temp:    97.3 F (36.3 C)  TempSrc:    Oral  SpO2: 91% 90% 91% 96%  Weight:      Height:       Eyes: PERRL, lids and conjunctivae normal ENMT: Mucous membranes are moist. Posterior pharynx clear of any exudate or lesions.  Neck: normal, supple, no masses, no thyromegaly Respiratory: Tachypneic with decreased overall aeration and expiratory wheezes appreciated. Patient speaking in shortened sentences. Mild accessory muscle usage. Cardiovascular: Tachycardic, no murmurs / rubs / gallops. No extremity edema. 2+ pedal pulses. No carotid bruits.  Abdomen: no tenderness, no masses palpated. No hepatosplenomegaly. Bowel sounds positive.  Musculoskeletal: no clubbing / cyanosis. No joint deformity upper and lower  extremities. Good ROM, no contractures. Normal muscle tone.  Skin: Mildly diaphoretic, no rashes, lesions, ulcers. No induration Neurologic: CN 2-12 grossly intact. Sensation intact, DTR normal. Strength 5/5 in all 4.  Psychiatric: Questionable judgment and insight. Alert and oriented x 3. Anxious mood.     Labs on Admission: I have personally  reviewed following labs and imaging studies  CBC:  Recent Labs Lab 11/03/16 2220  WBC 6.0  NEUTROABS 5.1  HGB 15.5  HCT 45.1  MCV 91.7  PLT 956   Basic Metabolic Panel:  Recent Labs Lab 11/03/16 2220  NA 135  K 3.9  CL 104  CO2 20*  GLUCOSE 147*  BUN 8  CREATININE 1.08  CALCIUM 9.4   GFR: Estimated Creatinine Clearance: 80.6 mL/min (by C-G formula based on SCr of 1.08 mg/dL). Liver Function Tests: No results for input(s): AST, ALT, ALKPHOS, BILITOT, PROT, ALBUMIN in the last 168 hours. No results for input(s): LIPASE, AMYLASE in the last 168 hours. No results for input(s): AMMONIA in the last 168 hours. Coagulation Profile: No results for input(s): INR, PROTIME in the last 168 hours. Cardiac Enzymes: No results for input(s): CKTOTAL, CKMB, CKMBINDEX, TROPONINI in the last 168 hours. BNP (last 3 results) No results for input(s): PROBNP in the last 8760 hours. HbA1C: No results for input(s): HGBA1C in the last 72 hours. CBG: No results for input(s): GLUCAP in the last 168 hours. Lipid Profile: No results for input(s): CHOL, HDL, LDLCALC, TRIG, CHOLHDL, LDLDIRECT in the last 72 hours. Thyroid Function Tests: No results for input(s): TSH, T4TOTAL, FREET4, T3FREE, THYROIDAB in the last 72 hours. Anemia Panel: No results for input(s): VITAMINB12, FOLATE, FERRITIN, TIBC, IRON, RETICCTPCT in the last 72 hours. Urine analysis:    Component Value Date/Time   COLORURINE YELLOW 11/03/2016 2220   APPEARANCEUR CLEAR 11/03/2016 2220   LABSPEC 1.005 11/03/2016 2220   PHURINE 5.5 11/03/2016 2220   GLUCOSEU NEGATIVE 11/03/2016 2220   GLUCOSEU NEGATIVE 06/29/2014 1318   HGBUR NEGATIVE 11/03/2016 2220   BILIRUBINUR NEGATIVE 11/03/2016 2220   BILIRUBINUR neg 02/07/2015 1205   KETONESUR NEGATIVE 11/03/2016 2220   PROTEINUR NEGATIVE 11/03/2016 2220   UROBILINOGEN 0.2 02/07/2015 1205   UROBILINOGEN 0.2 02/03/2015 1940   NITRITE NEGATIVE 11/03/2016 2220   LEUKOCYTESUR  NEGATIVE 11/03/2016 2220   Sepsis Labs: No results found for this or any previous visit (from the past 240 hour(s)).   Radiological Exams on Admission: Dg Chest Port 1 View  Result Date: 11/03/2016 CLINICAL DATA:  Shortness of breath, COPD EXAM: PORTABLE CHEST 1 VIEW COMPARISON:  08/21/2016 FINDINGS: Cardiomediastinal silhouette is stable. Mild hyperinflation. No infiltrate or pulmonary edema. IMPRESSION: No active disease. Electronically Signed   By: Lahoma Crocker M.D.   On: 11/03/2016 19:49    EKG: Independently reviewed. Sinus tachycardia at 103 beats per minute with no significant ischemic changes appreciated.  Assessment/Plan  COPD/Asthma exacerbation acute: Patient presents with complaints of shortness of breath. Found to have bilateral wheezing on exam. Chest x-ray showing hyperinflation without acute infiltrate. Given hour-long nebulized treatment, Solu-Medrol, and magnesium sulfate in the ED. - Admit to telemetry bed - DuoNeb's qid and prn  - Budesonide and Brovana neb - Solu-Medrol IV every 8 hours - Azithromycin IV - Continue Singulair - Question use of Afrin as possible trigger for patient's symptoms - May want to contact patient's pulmonologist in a.m.  Chest pain: No aspirin given as patient has allergy - Trend cardiac enzymes - Nitroglycerin prn - check UDS  Essential hypertension -  Continue metoprolol   Anxiety/bipolar disorder - Continue Xanax, but question frequency at which the patient was taking this medicine as it appears that he will adjust dosages medicines as needed.  - Patient reports not being on Abilify question if patient self stopped this medication.  Hyperlipidemia - Continue atorvastatin  Weight loss: Acute patient reports a 12 pound weight loss over the last week with decreased appetite. - Check TSH and ESR - Given patient's acute wheezing and respiratory issues may consider need a CT scan of chest if not improving.  History of prostate cancer:  Patient reports being followed by urology the outpatient setting for this.  Diabetes mellitus type 2, report to be diet controlled. Last hemoglobin A1c was noted to be 7.9 back on 04/2016. - Continue to monitor CBGs and added on sensitive sliding scale insulin if needed    DVT prophylaxis:  Lovenox  Code Status: Full Family Communication: No family present at bedside  Disposition Plan: Likely discharge home once medically stable Consults called:  None Admission status: Observation  Norval Morton MD [facility] Pager [phone]  If 7PM-7AM, please contact night-coverage [email or web address] Password [facility]  11/04/2016, 3:34 AM

## 2016-11-05 ENCOUNTER — Telehealth: Payer: Self-pay | Admitting: Pulmonary Disease

## 2016-11-05 LAB — GLUCOSE, CAPILLARY
GLUCOSE-CAPILLARY: 162 mg/dL — AB (ref 65–99)
GLUCOSE-CAPILLARY: 200 mg/dL — AB (ref 65–99)
GLUCOSE-CAPILLARY: 177 mg/dL — AB (ref 65–99)
Glucose-Capillary: 254 mg/dL — ABNORMAL HIGH (ref 65–99)
Glucose-Capillary: 184 mg/dL — ABNORMAL HIGH (ref 65–99)

## 2016-11-05 LAB — BASIC METABOLIC PANEL
Anion gap: 10 (ref 5–15)
BUN: 16 mg/dL (ref 6–20)
CHLORIDE: 104 mmol/L (ref 101–111)
CO2: 21 mmol/L — ABNORMAL LOW (ref 22–32)
CREATININE: 1.11 mg/dL (ref 0.61–1.24)
Calcium: 9.4 mg/dL (ref 8.9–10.3)
Glucose, Bld: 173 mg/dL — ABNORMAL HIGH (ref 65–99)
POTASSIUM: 4.7 mmol/L (ref 3.5–5.1)
SODIUM: 135 mmol/L (ref 135–145)

## 2016-11-05 LAB — CBC
HCT: 44.6 % (ref 39.0–52.0)
HEMOGLOBIN: 15.3 g/dL (ref 13.0–17.0)
MCH: 31.9 pg (ref 26.0–34.0)
MCHC: 34.3 g/dL (ref 30.0–36.0)
MCV: 92.9 fL (ref 78.0–100.0)
PLATELETS: 268 10*3/uL (ref 150–400)
RBC: 4.8 MIL/uL (ref 4.22–5.81)
RDW: 13.7 % (ref 11.5–15.5)
WBC: 18.6 10*3/uL — ABNORMAL HIGH (ref 4.0–10.5)

## 2016-11-05 LAB — T4, FREE: Free T4: 0.9 ng/dL (ref 0.61–1.12)

## 2016-11-05 MED ORDER — AZITHROMYCIN 250 MG PO TABS
500.0000 mg | ORAL_TABLET | Freq: Every day | ORAL | Status: DC
  Administered 2016-11-06: 500 mg via ORAL
  Filled 2016-11-05: qty 2

## 2016-11-05 MED ORDER — FLEET ENEMA 7-19 GM/118ML RE ENEM
1.0000 | ENEMA | Freq: Once | RECTAL | Status: AC
  Administered 2016-11-05: 1 via RECTAL
  Filled 2016-11-05: qty 1

## 2016-11-05 NOTE — Telephone Encounter (Signed)
lmtcb X1 for pt  

## 2016-11-05 NOTE — Telephone Encounter (Signed)
LMTCB

## 2016-11-05 NOTE — Telephone Encounter (Signed)
RA signed paperwork today and I have faxed everything to [facility]. Will call her tomorrow to confirm she received everything. Will hold signed forms in my To-Folder until 1/29.

## 2016-11-05 NOTE — Progress Notes (Signed)
Pharmacy IV to PO conversion  This patient is receiving Azithromycin by the intravenous route. Based on criteria approved by the Pharmacy and Therapeutics Committee, and the Infectious Disease Division, the antibiotic(s) is/are being converted to equivalent oral dose form(s). These criteria include:   Patient being treated for a respiratory tract infection, urinary tract infection, cellulitis, or Clostridium Difficile Associated Diarrhea  The patient is not neutropenic and does not exhibit a GI malabsorption state  The patient is eating (either orally or per tube) and/or has been taking other orally administered medications for at least 24 hours.  The patient is improving clinically (physician assessment and a 24-hour Tmax of <=100.5 F)  If you have any questions about this conversion, please contact the Pharmacy Department (ext [phone]).  Thank you.  Reuel Boom, PharmD Pager: [phone] 11/05/2016, 2:16 PM

## 2016-11-05 NOTE — Telephone Encounter (Signed)
Patient states case worker told him to get in touch with our office and ask Dr. Elsworth Soho to place order for portable O2.

## 2016-11-05 NOTE — Progress Notes (Signed)
PROGRESS NOTE                                                                                                                                                                                                             Patient Demographics:    [name], is a 66 y.o. male, DOB - [birth year], [id number]  Admit date - 11/03/2016   Admitting Physician Norval Morton, MD  Outpatient Primary MD for the patient is Betty Martinique, MD  LOS - 1  Outpatient Specialists: Pulmonary Dr Elsworth Soho  Chief Complaint  Patient presents with  . Shortness of Breath       Brief Narrative    66 y.o. male with complicated medical history significant of HTN, HLD, COPD/asthma, PVCs, prostate cancer, bipolar disorder, anxiety, GERD, tobacco abuse, alcohol abuse, and multiple medication/envioromental allergies; presents with complaints of 2 weeks of gradually worsening shortness of breath, admitted for COPD exacerbation    Subjective:    [name] today has, No headache, No chest pain, No abdominal pain - Reports shortness of breath and cough  Assessment  & Plan :    Principal Problem:   COPD exacerbation ([place]) Active Problems:   Hypertension   Anxiety   Diabetes mellitus without complication- (diet controlled; w/o proteinuria)   Patient's noncompliance with other medical treatment and regimen   non-specific Chest pain   Asthma exacerbation   COPD exacerbation acute:  - Patient presents with complaints of shortness of breath. Found to have bilateral wheezing on exam. Chest x-ray showing hyperinflation without acute infiltrate.  - patient has been recently on prolonged prednisone taper,Continue with IV steroids, still with wheezing and decreased air entry today, so no steroid taper . - DuoNeb's qid and prn  - Budesonide and Brovana neb - Azithromycin IV - Continue Singulair - Question use of Afrin as possible trigger for patient's symptoms - Gentle need  oxygen tank and oxygen on ambulation to be arranged as most of his symptoms on exertion - Leukocytosis secondary to steroids  Chest pain:  - This appears to be secondary to dyspnea ,No aspirin given as patient has allergy -  cardiac enzymes negative 3  Essential hypertension - Changed metoprolol to bisoprolol  Anxiety/bipolar disorder - Continue Xanax, but question frequency at which the patient was taking this medicine as it appears that he will adjust  dosages medicines as needed.  - Patient reports not being on Abilify question if patient self stopped this medication.  Hyperlipidemia - Continue atorvastatin  Weight loss:  - Acute patient reports a 12 pound weight loss over the last week with decreased appetite. - TSH borderline at 3.6 , will check free T4 - Further workup as an outpatient  History of prostate cancer:  - Patient reports being followed by urology the outpatient setting for this.  Diabetes mellitus type 2,  - report to be diet controlled. Last hemoglobin A1c was noted to be 7.9 back on 04/2016. - Continue to monitor CBGs on sensitive sliding scale insulin if needed    Code Status : Full  Family Communication  : None at bedside  Disposition Plan  : home when stable  Consults  :  none  Procedures  : None  DVT Prophylaxis  :  Lovenox -SCDs   Lab Results  Component Value Date   PLT 268 11/05/2016    Antibiotics  :    Anti-infectives    Start     Dose/Rate Route Frequency Ordered Stop   11/04/16 0445  azithromycin (ZITHROMAX) 500 mg in dextrose 5 % 250 mL IVPB     500 mg 250 mL/hr over 60 Minutes Intravenous Daily 11/04/16 0437          Objective:   Vitals:   11/04/16 1919 11/04/16 2036 11/05/16 0500 11/05/16 0752  BP:  (!) 133/46 122/67   Pulse:  88 83   Resp:  18 18   Temp:  98.1 F (36.7 C) 97.8 F (36.6 C)   TempSrc:  Oral Oral   SpO2: 92% 93% 95% 93%  Weight:      Height:        Wt Readings from Last 3 Encounters:    11/03/16 95.3 kg (210 lb)  10/29/16 96.5 kg (212 lb 11.2 oz)  10/02/16 98.8 kg (217 lb 12.8 oz)     Intake/Output Summary (Last 24 hours) at 11/05/16 1408 Last data filed at 11/05/16 1358  Gross per 24 hour  Intake             1300 ml  Output              375 ml  Net              925 ml     Physical Exam  Awake Alert, Oriented X 3, No new F.N deficits, Normal affect [place].AT,PERRAL Supple Neck,No JVD, No cervical lymphadenopathy appriciated.  Symmetrical Chest wall movement, diminished air movement bilaterally, still  wheezing RRR,No Gallops,Rubs or new Murmurs, No Parasternal Heave +ve B.Sounds, Abd Soft, No tenderness, No rebound - guarding or rigidity. No Cyanosis, Clubbing or edema, No new Rash or bruise     Data Review:    CBC  Recent Labs Lab 11/03/16 2220 11/04/16 0624 11/05/16 0539  WBC 6.0 7.1 18.6*  HGB 15.5 14.5 15.3  HCT 45.1 42.1 44.6  PLT 204 219 268  MCV 91.7 92.1 92.9  MCH 31.5 31.7 31.9  MCHC 34.4 34.4 34.3  RDW 13.3 13.7 13.7  LYMPHSABS 0.7 0.5*  --   MONOABS 0.1 0.0*  --   EOSABS 0.1 0.0  --   BASOSABS 0.0 0.0  --     Chemistries   Recent Labs Lab 11/03/16 2220 11/04/16 0624 11/05/16 0539  NA 135 134* 135  K 3.9 4.6 4.7  CL 104 103 104  CO2 20* 20* 21*  GLUCOSE 147*  200* 173*  BUN 8 11 16   CREATININE 1.08 1.19 1.11  CALCIUM 9.4 9.1 9.4   ------------------------------------------------------------------------------------------------------------------ No results for input(s): CHOL, HDL, LDLCALC, TRIG, CHOLHDL, LDLDIRECT in the last 72 hours.  Lab Results  Component Value Date   HGBA1C 5.9 04/30/2016   ------------------------------------------------------------------------------------------------------------------  Recent Labs  11/04/16 0624  TSH 0.361   ------------------------------------------------------------------------------------------------------------------ No results for input(s): VITAMINB12, FOLATE,  FERRITIN, TIBC, IRON, RETICCTPCT in the last 72 hours.  Coagulation profile No results for input(s): INR, PROTIME in the last 168 hours.  No results for input(s): DDIMER in the last 72 hours.  Cardiac Enzymes  Recent Labs Lab 11/04/16 0624 11/04/16 1153 11/04/16 1822  TROPONINI <0.03 <0.03 <0.03   ------------------------------------------------------------------------------------------------------------------ No results found for: BNP  Inpatient Medications  Scheduled Meds: . arformoterol  15 mcg Nebulization BID  . atorvastatin  40 mg Oral QHS  . azithromycin  500 mg Intravenous Q0600  . bisoprolol  5 mg Oral Daily  . budesonide (PULMICORT) nebulizer solution  0.5 mg Nebulization BID  . enoxaparin (LOVENOX) injection  40 mg Subcutaneous Q24H  . insulin aspart  0-9 Units Subcutaneous TID WC  . ipratropium-albuterol  3 mL Nebulization QID  . methylPREDNISolone (SOLU-MEDROL) injection  60 mg Intravenous Q8H  . montelukast  10 mg Oral QHS  . pantoprazole  40 mg Oral BID   Continuous Infusions: . albuterol 10 mg/hr (11/03/16 1918)   PRN Meds:.acetaminophen, ALPRAZolam, gi cocktail, HYDROcodone-acetaminophen, ipratropium-albuterol, nitroGLYCERIN, ondansetron (ZOFRAN) IV  Micro Results No results found for this or any previous visit (from the past 240 hour(s)).  Radiology Reports Dg Chest Port 1 View  Result Date: 11/03/2016 CLINICAL DATA:  Shortness of breath, COPD EXAM: PORTABLE CHEST 1 VIEW COMPARISON:  08/21/2016 FINDINGS: Cardiomediastinal silhouette is stable. Mild hyperinflation. No infiltrate or pulmonary edema. IMPRESSION: No active disease. Electronically Signed   By: Lahoma Crocker M.D.   On: 11/03/2016 19:49     Romanda Turrubiates M.D on 11/05/2016 at 2:07 PM  Between 7am to 7pm - Pager - [phone]  After 7pm go to [email or web address] - password [facility]  [facility] -  Office  [phone]

## 2016-11-06 ENCOUNTER — Telehealth: Payer: Self-pay | Admitting: Pulmonary Disease

## 2016-11-06 LAB — CBC
HEMATOCRIT: 40.1 % (ref 39.0–52.0)
HEMOGLOBIN: 13.7 g/dL (ref 13.0–17.0)
MCH: 31.9 pg (ref 26.0–34.0)
MCHC: 34.2 g/dL (ref 30.0–36.0)
MCV: 93.3 fL (ref 78.0–100.0)
Platelets: 241 10*3/uL (ref 150–400)
RBC: 4.3 MIL/uL (ref 4.22–5.81)
RDW: 13.8 % (ref 11.5–15.5)
WBC: 17.3 10*3/uL — ABNORMAL HIGH (ref 4.0–10.5)

## 2016-11-06 LAB — BASIC METABOLIC PANEL
ANION GAP: 7 (ref 5–15)
BUN: 26 mg/dL — ABNORMAL HIGH (ref 6–20)
CALCIUM: 8.8 mg/dL — AB (ref 8.9–10.3)
CO2: 23 mmol/L (ref 22–32)
Chloride: 103 mmol/L (ref 101–111)
Creatinine, Ser: 1.14 mg/dL (ref 0.61–1.24)
GFR calc non Af Amer: >60 mL/min (ref >60)
Glucose, Bld: 209 mg/dL — ABNORMAL HIGH (ref 65–99)
POTASSIUM: 4.9 mmol/L (ref 3.5–5.1)
Sodium: 133 mmol/L — ABNORMAL LOW (ref 135–145)

## 2016-11-06 LAB — GLUCOSE, CAPILLARY
GLUCOSE-CAPILLARY: 188 mg/dL — AB (ref 65–99)
GLUCOSE-CAPILLARY: 259 mg/dL — AB (ref 65–99)

## 2016-11-06 MED ORDER — PREDNISONE 50 MG PO TABS: 50.0000 mg | ORAL_TABLET | Freq: Every day | ORAL | Status: DC

## 2016-11-06 MED ORDER — PREDNISONE 10 MG PO TABS: ORAL_TABLET | ORAL | 0 refills | Status: DC

## 2016-11-06 MED ORDER — BISOPROLOL FUMARATE 5 MG PO TABS: 5.0000 mg | ORAL_TABLET | Freq: Every day | ORAL | 0 refills | Status: DC

## 2016-11-06 NOTE — Discharge Instructions (Signed)
Follow with Primary MD Jonathon Pal, DO in 7 days   Get CBC, CMP, 2 view Chest X ray checked  by Primary MD next visit.    Activity: As tolerated with Full fall precautions use walker/cane & assistance as needed   Disposition Home   Diet: Heart Healthy , carb modified , with feeding assistance and aspiration precautions.  For Heart failure patients - Check your Weight same time everyday, if you gain over 2 pounds, or you develop in leg swelling, experience more shortness of breath or chest pain, call your Primary MD immediately. Follow Cardiac Low Salt Diet and 1.5 lit/day fluid restriction.   On your next visit with your primary care physician please Get Medicines reviewed and adjusted.   Please request your Prim.MD to go over all Hospital Tests and Procedure/Radiological results at the follow up, please get all Hospital records sent to your Prim MD by signing hospital release before you go home.   If you experience worsening of your admission symptoms, develop shortness of breath, life threatening emergency, suicidal or homicidal thoughts you must seek medical attention immediately by calling 911 or calling your MD immediately  if symptoms less severe.  You Must read complete instructions/literature along with all the possible adverse reactions/side effects for all the Medicines you take and that have been prescribed to you. Take any new Medicines after you have completely understood and accpet all the possible adverse reactions/side effects.   Do not drive, operating heavy machinery, perform activities at heights, swimming or participation in water activities or provide baby sitting services if your were admitted for syncope or siezures until you have seen by Primary MD or a Neurologist and advised to do so again.  Do not drive when taking Pain medications.    Do not take more than prescribed Pain, Sleep and Anxiety Medications  Special Instructions: If you have smoked  or chewed Tobacco  in the last 2 yrs please stop smoking, stop any regular Alcohol  and or any Recreational drug use.  Wear Seat belts while driving.   Please note  You were cared for by a hospitalist during your hospital stay. If you have any questions about your discharge medications or the care you received while you were in the hospital after you are discharged, you can call the unit and asked to speak with the hospitalist on call if the hospitalist that took care of you is not available. Once you are discharged, your primary care physician will handle any further medical issues. Please note that NO REFILLS for any discharge medications will be authorized once you are discharged, as it is imperative that you return to your primary care physician (or establish a relationship with a primary care physician if you do not have one) for your aftercare needs so that they can reassess your need for medications and monitor your lab values.

## 2016-11-06 NOTE — Telephone Encounter (Signed)
Pt returning call again.[name] ° °

## 2016-11-06 NOTE — Telephone Encounter (Signed)
Pt states he will return our call, as his nurse is currently in his room. Will await call back.

## 2016-11-06 NOTE — Progress Notes (Signed)
Inpatient Diabetes Program Recommendations  AACE/ADA: New Consensus Statement on Inpatient Glycemic Control (2015)  Target Ranges:  Prepandial:   less than 140 mg/dL      Peak postprandial:   less than 180 mg/dL (1-2 hours)      Critically ill patients:  140 - 180 mg/dL   Lab Results  Component Value Date   GLUCAP 188 (H) 11/06/2016   HGBA1C 5.9 04/30/2016    Review of Glycemic Control  Diabetes history: DM2 Outpatient Diabetes medications: None Current orders for Inpatient glycemic control: Novolog 0-9 units tidwc  Need updated HgbA1C. Last one 5.9% in 04/30/2016. Needs to add CHO mod med to heart healthy diet.  Inpatient Diabetes Program Recommendations:    Updated HgbA1C Add CHO mod med to heart healthy diet Increase Novolog to 0-15 units tidwc and hs while on steroids.  Will continue to follow. Thank you. Lorenda Peck, RD, LDN, CDE Inpatient Diabetes Coordinator [phone]

## 2016-11-06 NOTE — Consult Note (Addendum)
   [facility] CM Inpatient Consult   11/06/2016  [name] [birth year] [id number]    [name] screened for [place] [facility] program services. Spoke with him at bedside to discuss and explain [place] [facility] program in detail. He endorses he needs additional follow up as he has multiple co-morbities and has "more medications than [facility]". States he takes so many different medications and he often has questions about the interactions of his medications because he is on so many. He is agreeable to [place] [facility] program and written consent obtained.  He lives with his wife. Denies having issues with transportation or with affording medication. Confirmed best contact number as [phone] and home [phone]. States his Primary Care is Dr. Nani Ravens at [facility] on [facility].   Discussed referral for [facility] for home visits and [place] referral for his medication confusion due to the amount of medications he takes and questions about interactions. [name] has had x3 hospitalizations in the past 6 months. He has a history of HTN, HLD, COPD, asthma, prostate cancer, bipolar disorder, anxiety, GERD.  Will make inpatient RNCM aware that [place] [facility] will follow post hospital discharge. Will make referral for [facility] RNCM and [facility] PharmD.   Marthenia Rolling, MSN-Ed, RN,BSN [facility] Liaison [phone]

## 2016-11-06 NOTE — Progress Notes (Signed)
Spoke with pt on 11/05/16 to explain the process of receiving a small portable O2 tank (one that he can wear on his shoulder).  Pt will need to have his PCP or Pulmonary MD to order small portable tank. Pt will need to have a test at home with a smaller tank to see if his O2 Sats will stay at a normal level.

## 2016-11-06 NOTE — Telephone Encounter (Signed)
Pt returning call and can be reached @ [phone]  Pt is in hosp in rm # [id number] [facility]  And cn be reach @ [phone].[name]

## 2016-11-06 NOTE — Telephone Encounter (Signed)
Spoke with pt, requesting that we order him a POC for daytime use.  Pt is currently on 02 2lpm qhs only.  I advised pt that we would need to do a qualifying walk test in office, but since he is currently hospitalized that his 02 levels would be monitored while there, and that we can order this if he qualifies for it when we see him for his hospital follow up visit.  Pt expressed understanding. Nothing further needed.

## 2016-11-06 NOTE — Discharge Summary (Signed)
[name], is a 66 y.o. male  DOB [birth year]  MRN [id number].  Admission date:  11/03/2016  Admitting Physician  Norval Morton, MD  Discharge Date:  11/06/2016   Primary MD  Shelda Pal, DO  Recommendations for primary care physician for things to follow:  - Please check CBC, BMP during next visit. - Patient will need follow-up with pulmonary in the next 1-2 weeks - Check TSH and free T4 in 6 weeks   Admission Diagnosis  Shortness of breath [R06.02] Moderate persistent asthma with exacerbation [J45.41]   Discharge Diagnosis  Shortness of breath [R06.02] Moderate persistent asthma with exacerbation [J45.41]    Principal Problem:   COPD exacerbation (HCC) Active Problems:   Hypertension   Anxiety   Diabetes mellitus without complication- (diet controlled; w/o proteinuria)   Patient's noncompliance with other medical treatment and regimen   non-specific Chest pain   Asthma exacerbation      Past Medical History:  Diagnosis Date  . Anxiety   . Anxiety disorder   . Arthritis    BILATERAL SHOULDERS, ELBOWS AND HANDS AND LEFT HIP AND KNEES--HX CORTISONE SHOTS IN SHOULDERS, ELBOWS, HIP AND KNEES  . Basal cell carcinoma of back 04/30/2016   Dermatologist- Dr Nevada Crane;   MOHS sx- Dr Levada Dy   . Bladder outlet obstruction   . Chronic idiopathic constipation 05/08/2015  . Complication of anesthesia    DIFFICULT WAKING   . COPD (chronic obstructive pulmonary disease) Gold C Frequent exacerbations 01/13/2014   [name] 07/08/14: FeV1 51% FeV1/FVC 66% FVC 59% 10/5/2015ONO RA was normal  10/13/2014  ONO on RA NORMAL   . COPD, frequent exacerbations ([place])    pulmologist-  dr Joya Gaskins--  Girtha Rm Stage C.04-25-15 recent COPD exacerbation-much improved now, after tx. in ER [facility].  Marland Kitchen Depression   . Diabetes mellitus without complication ([place])    BODERLINE - DIET CONTROL  . Dysrhythmia    PVC'S    . Family history of adverse reaction to anesthesia    father would wake up with agitation   . Former smoker 01/13/2014  . Gastroesophageal reflux disease without esophagitis   . GERD (gastroesophageal reflux disease)   . Heavy alcohol use 04/30/2016  . History of chronic bronchitis   . History of oxygen administration    oxygen use 2 l/m nasally at bedtime and exertional occasions  . History of rheumatic fever   . History of TB (tuberculosis)    1984--  hospitalized for 4 month treatment  . History of urinary retention   . Hx of multiple concussions    x 2 per patient   . Hypertension   . Melanoma ([place])   . Nocturnal oxygen desaturation    USES O2 NIGHTLY  . OSA (obstructive sleep apnea) 01/13/2014  . Pneumonia    hx of   . PONV (postoperative nausea and vomiting)   . Post-polio syndrome    polio at age 50--PT WAS IN IRON LUNG; PT WAS IN W/C UNTIL AGE 75; STILL HAS WEAKNESS  RIGHT SIDE  . Prostate cancer ([place])   . Prostate cancer ([place])   . Schizophrenia ([place])   . Shortness of breath dyspnea    RIGHT HEMIDIAPHRAGM ELEVATION - POST POLIO SYNDROME  . Tuberculosis    Hosp 4 months rx , left early     Past Surgical History:  Procedure Laterality Date  . CARDIOVASCULAR STRESS TEST  06-08-2014  dr Mare Ferrari   normal lexiscan study/  no ischemia/  not gated due to PAC's  . COLONOSCOPY N/A 05/03/2015   Procedure: COLONOSCOPY;  Surgeon: [name], MD;  Location: [facility];  Service: Endoscopy;  Laterality: N/A;  . CYSTOSCOPY N/A 10/25/2015   Procedure: CYSTOSCOPY;  Surgeon: [name], MD;  Location: [facility];  Service: Urology;  Laterality: N/A;  . CYSTOSCOPY W/ CYSTOGRAM/  TRANSRECTAL ULTRASOUND PROSTATE BX  03-22-2009  . ESOPHAGOGASTRODUODENOSCOPY N/A 03/22/2015   Procedure: ESOPHAGOGASTRODUODENOSCOPY (EGD) with dilation;  Surgeon: [name], MD;  [facility];  Service: Endoscopy;  Laterality: N/A;  . excision of skin lesion    . LAPAROSCOPIC CHOLECYSTECTOMY  2013  .  left elbow surgery      due to fracture   . NASAL SEPTUM SURGERY  2000  . OTHER SURGICAL HISTORY      Muscle & bone Graft/Polio  . polio surgeries      14 polio surgeries   . PROSTATE BIOPSY N/A 09/28/2014   Procedure: PROSTATE ULTRASOUND/BIOPSY;  Surgeon: [name], MD;  Location: [facility];  Service: Urology;  Laterality: N/A;  . PROSTATE BIOPSY N/A 10/25/2015   Procedure: PROSTATE BIOPSY AND ULTRASOUND;  Surgeon: [name], MD;  Location: [facility];  Service: Urology;  Laterality: N/A;  . SAVORY DILATION N/A 03/22/2015   Procedure: SAVORY DILATION;  Surgeon: [name], MD;  [facility];  Service: Endoscopy;  Laterality: N/A;  . SHOULDER ARTHROSCOPY WITH OPEN ROTATOR CUFF REPAIR Bilateral 2013  &  1999   removal spurs and bursectomy  . TRANSURETHRAL INCISION OF BLADDER NECK N/A 10/25/2015   Procedure:  TRANSURETHRAL INCISION OF BLADDER NECK;  Surgeon: [name], MD;  [facility];  Service: Urology;  Laterality: N/A;  . TRANSURETHRAL RESECTION OF PROSTATE N/A 09/28/2014   Procedure: TRANSURETHRAL RESECTION OF THE PROSTATE (TURP);  Surgeon: [name], MD;  Location[facility];  Service: Urology;  Laterality: N/A;  . URETEROSOPY STONE EXTRACTION  2000       History of present illness and  Hospital Course:     Kindly see H&P for history of present illness and admission details, please review complete Labs, Consult reports and Test reports for all details in brief  HPI  from the history and physical done on the day of admission 11/04/2016 HPI: [name] is a 66 y.o. male with complicated medical history significant of HTN, HLD, COPD/asthma, PVCs, prostate cancer, bipolar disorder, anxiety, GERD, tobacco abuse, alcohol abuse, and multiple medication/envioromental allergies; presents with complaints of 2 weeks of gradually worsening shortness of breath. Reported associated symptoms of a minimally productive cough, wheezing, chest pain, chest tightness, weight loss of  approximately 12 pounds in the last week, and decreased appetite. Patient was recently seen by Dr. Elsworth Soho one month ago for which he reports being stopped on Breo and placed on Perforomist and budesonide. 11 days ago the patient reports being not nauseated after taking the Performist with increased wheezing for which she was prescribed Brovana. Patient noted some mild improvement with the budesonide of his symptoms, but was  unable to obtain the newly prescribed Brovana. He reports that he's been in the process of trying to obtain this will last week or more, but still has not been able to get the prescription filled through his pharmacy as is not on the preferred list. He has been taking Mucinex without improvement to cough anything up. Patient admits to self prescribing 20 mg of prednisone daily for the last 4 days, use of codeine cough syrup, and additional doses of prescribed inhalers/nebulized treatments. During this 2 week period he had also reported intermittent sharp left-sided chest pains that usually only last a few seconds and can occur with certain movements. 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 For review records it appears that the patient has been evaluated with 2-D echo back in 06/2016 which showed EF of 55-60% with normal LV function. Then patient had a negative myocardial perfusion study in 07/2016, and wore a 30 day Holter monitor in 08/2016 which does not appear to show any acute events. He denies smoking and reports quitting 3 years ago. He reports only intermittent alcohol use of 1-3 beers. Patient denies any fever, abdominal pain, nausea, vomiting, recent sick contacts, travel, leg swelling, or calf pain.   ED Course: Patient was [facility] at [facility] be tachycardic and tachypneic with bilateral wheezing. S/P IV solumedrol 125mg  x one, xanax 1mg  po x one, and continuous albuterol nebulizer, and started on magnesium  sulfate. Patient was initially admitted to a MedSurg bed but had to be switched to a telemetry bed due to complaints of chest pain on arrival after trying to remove his socks. EKG shows sinus tachycardia without any acute changes from previous tracings. Troponins ordered.   Hospital Course  65 y.o.malewith complicated medical history significant of HTN, HLD, COPD/asthma, PVCs, prostate cancer, bipolar disorder, anxiety, GERD, tobacco abuse, alcohol abuse, and multiple medication/envioromental allergies; presents with complaints of 2 weeks of gradually worsening shortness of breath, admitted for COPD exacerbation   COPD exacerbationacute:  - Patient presents with complaints of shortness of breath. Found to have bilateral wheezing on exam. Chest x-ray showing hyperinflation without acute infiltrate.  - patient has been recently on prolonged prednisone taper, treated with IV steroids, nebulizer treatment as needed, IV's azithromycin, patient is improving, asked to go home today, no wheezing, no dyspnea, unrelated with no hypoxia, so we'll discharge on prolonged prednisone taper and to follow with pulmonary in 1-2 weeks. - Continue Singulair, Brovana. - No further productive cough, will discontinue IV azithromycin on discharge  Chest pain:  - This appears to be secondary to dyspnea ,No aspirin given as patient has allergy -  cardiac enzymes negative 3  Essential hypertension - Changed metoprolol to bisoprolol  Anxiety/bipolar disorder - cont with home medication  Hyperlipidemia - Continue atorvastatin  Weight loss:  - Acute patient reports a 12 pound weight loss over the last week with decreased appetite. - TSH borderline at 3.6 , free T4 0.9 - History check TSH and free T4 level in 6 weeks - Further workup as an outpatient  History of prostate cancer:  - Patient reports being followed by urology the outpatient setting for this.  Diabetes mellitus type 2, - report to be  diet controlled. Last hemoglobin A1c was noted to be 7.9 back on 04/2016. - Continue to monitor CBGs on sensitive sliding scale insulin if needed  Hyperglycemia - due to steroids, patient reports he is measuring his BPs at  home, usually controlled in the 110s range, it should improve after discontinuing steroids,  to continue with carb modified diet  Discharge Condition:  stable  Follow UP  Follow-up Information    [facility] MD Follow up in 1 week(s).   Specialty:  Pulmonary Disease Contact information: [address] [place] [address] [phone]        Nicholas Paul Wendling, DO Follow up in 1 week(s).   Specialty:  Family Medicine Contact information: [place] [address] [phone]             Discharge Instructions  and  Discharge Medications     Discharge Instructions    AMB Referral to [facility]    Complete by:  As directed    Please assign to [place] [facility] Pharm[facility]. Written consent obtained. [facility] for multiple co-morbidities, x3 admits in 6 months,  COPD. [facility] PharmD for medication confusion due to the amount medications he is on and questions about interactions. Please liaison notes for details. Likely discharge soon. Thanks. Marthenia Rolling, [place], [facility] [id number]   Reason for consult:  Please assign to [facility] and [facility] PharmD   Diagnoses of:  COPD/ Pneumonia   Expected date of contact:  1-3 days (reserved for hospital discharges)   Discharge instructions    Complete by:  As directed    Follow with Primary MD Shelda Pal, DO in 7 days   Get CBC, CMP, 2 view Chest X ray checked  by Primary MD next visit.    Activity: As tolerated with Full fall precautions use walker/cane & assistance as needed   Disposition Home   Diet: Heart Healthy , carb modified , with feeding assistance and aspiration precautions.  For Heart failure  patients - Check your Weight same time everyday, if you gain over 2 pounds, or you develop in leg swelling, experience more shortness of breath or chest pain, call your Primary MD immediately. Follow Cardiac Low Salt Diet and 1.5 lit/day fluid restriction.   On your next visit with your primary care physician please Get Medicines reviewed and adjusted.   Please request your Prim.MD to go over all Hospital Tests and Procedure/Radiological results at the follow up, please get all Hospital records sent to your Prim MD by signing hospital release before you go home.   If you experience worsening of your admission symptoms, develop shortness of breath, life threatening emergency, suicidal or homicidal thoughts you must seek medical attention immediately by calling 911 or calling your MD immediately  if symptoms less severe.  You Must read complete instructions/literature along with all the possible adverse reactions/side effects for all the Medicines you take and that have been prescribed to you. Take any new Medicines after you have completely understood and accpet all the possible adverse reactions/side effects.   Do not drive, operating heavy machinery, perform activities at heights, swimming or participation in water activities or provide baby sitting services if your were admitted for syncope or siezures until you have seen by Primary MD or a Neurologist and advised to do so again.  Do not drive when taking Pain medications.    Do not take more than prescribed Pain, Sleep and Anxiety Medications  Special Instructions: If you have smoked or chewed Tobacco  in the last 2 yrs please stop smoking, stop any regular Alcohol  and or any Recreational drug use.  Wear Seat belts while driving.  Please note  You were cared for by a hospitalist during your hospital stay. If you have any questions about your discharge medications or the care you received while you were in the hospital after you are  discharged, you can call the unit and asked to speak with the hospitalist on call if the hospitalist that took care of you is not available. Once you are discharged, your primary care physician will handle any further medical issues. Please note that NO REFILLS for any discharge medications will be authorized once you are discharged, as it is imperative that you return to your primary care physician (or establish a relationship with a primary care physician if you do not have one) for your aftercare needs so that they can reassess your need for medications and monitor your lab values.   Increase activity slowly    Complete by:  As directed      Allergies as of 11/06/2016      Reactions   Aspirin Anaphylaxis   Hornet Venom Anaphylaxis   Ivp Dye [iodinated Diagnostic Agents] Anaphylaxis   Levaquin [levofloxacin In D5w] Shortness Of Breath, Swelling   In addition: sweating, chest pain, and diarrhea.    Nsaids Anaphylaxis   Penicillins Anaphylaxis   Heart stops Has patient had a PCN reaction causing immediate rash, facial/tongue/throat swelling, SOB or lightheadedness with hypotension: yes Has patient had a PCN reaction causing severe rash involving mucus membranes or skin necrosis: yes Has patient had a PCN reaction that required hospitalization yes Has patient had a PCN reaction occurring within the last 10 years: No If all of the above answers are "NO", then may proceed with Cephalosporin use.   Tolmetin Anaphylaxis   Perforomist [formoterol]    Increased wheezing, shortness of breath   Buprenorphine Hcl Nausea And Vomiting   Can take with zofran    Morphine And Related Nausea And Vomiting   Can take with zofran    Oxycodone Itching, Rash      Medication List    STOP taking these medications   metoprolol succinate 25 MG 24 hr tablet Commonly known as:  TOPROL-XL   oxymetazoline 0.05 % nasal spray Commonly known as:  AFRIN     TAKE these medications   ACCU-CHEK AVIVA device Use  to check blood sugars every morning fasting, 2 hours after largest meal and as needed with low blood sugar symptoms   acetaminophen 500 MG tablet Commonly known as:  TYLENOL Take 500 mg by mouth every 6 (six) hours as needed for headache (pain).   albuterol 108 (90 Base) MCG/ACT inhaler Commonly known as:  PROVENTIL HFA;VENTOLIN HFA Inhale 2 puffs into the lungs every 6 (six) hours as needed for wheezing or shortness of breath.   ALPRAZolam 1 MG tablet Commonly known as:  XANAX Take 0.5 tablets (0.5 mg total) by mouth 2 (two) times daily as needed for anxiety. What changed:  how much to take  when to take this   arformoterol 15 MCG/2ML Nebu Commonly known as:  BROVANA Take 2 mLs (15 mcg total) by nebulization 2 (two) times daily.   ARIPiprazole 5 MG tablet Commonly known as:  ABILIFY Take 1 tablet (5 mg total) by mouth daily.   atorvastatin 40 MG tablet Commonly known as:  LIPITOR One tab q hs What changed:  how much to take  how to take this  when to take this  additional instructions   bisoprolol 5 MG tablet Commonly known as:  ZEBETA Take 1 tablet (5  mg total) by mouth daily. Start taking on:  11/07/2016   budesonide 0.25 MG/2ML nebulizer solution Commonly known as:  PULMICORT Take 2 mLs (0.25 mg total) by nebulization 2 (two) times daily.   dexlansoprazole 60 MG capsule Commonly known as:  DEXILANT Take 1 capsule (60 mg total) by mouth daily. What changed:  when to take this   EPINEPHrine 0.3 mg/0.3 mL Soaj injection Commonly known as:  EPI-PEN Inject 0.3 mLs (0.3 mg total) into the muscle once. What changed:  when to take this  reasons to take this   fluticasone 50 MCG/ACT nasal spray Commonly known as:  FLONASE Place 2 sprays into both nostrils daily.   fluticasone furoate-vilanterol 100-25 MCG/INH Aepb Commonly known as:  BREO ELLIPTA INHALE 1 PUFF BY MOUTH EVERY MORNING   HYDROcodone-acetaminophen 5-325 MG tablet Commonly known as:   NORCO/VICODIN Take 1 tablet by mouth every 4 (four) hours as needed for moderate pain.   ipratropium-albuterol 0.5-2.5 (3) MG/3ML Soln Commonly known as:  DUONEB Take 3 mLs by nebulization every 4 (four) hours as needed (shortness of breath and wheezing).   montelukast 10 MG tablet Commonly known as:  SINGULAIR TAKE 1 TABLET (10 MG TOTAL) BY MOUTH AT BEDTIME. What changed:  how much to take  how to take this  when to take this  additional instructions   nitroGLYCERIN 0.4 MG SL tablet Commonly known as:  NITROSTAT Place 1 tablet (0.4 mg total) under the tongue every 5 (five) minutes as needed for chest pain.   ondansetron 4 MG tablet Commonly known as:  ZOFRAN Take 1 tablet (4 mg total) by mouth every 12 (twelve) hours as needed for nausea or vomiting.   OXYGEN Inhale 2 L into the lungs at bedtime. Reported on 11/25/2015   predniSONE 10 MG tablet Commonly known as:  DELTASONE Please take 40 mg oral daily for 3 days, then 30 mg oral daily for 3 days, then 20 mg oral daily for 3 days, then 10 mg oral daily until seen by pulmonary. What changed:  medication strength  how much to take  how to take this  when to take this  additional instructions   sodium phosphate 7-19 GM/118ML Enem Place 1 enema rectally as needed for severe constipation.            Durable Medical Equipment        Start     Ordered   11/06/16 1230  For home use only DME Nebulizer machine  Once    Question:  Patient needs a nebulizer to treat with the following condition  Answer:  COPD exacerbation ([place])   11/06/16 1230        Diet and Activity recommendation: See Discharge Instructions above   Consults obtained -  none   Major procedures and Radiology Reports - PLEASE review detailed and final reports for all details, in brief -     Dg Chest Port 1 View  Result Date: 11/03/2016 CLINICAL DATA:  Shortness of breath, COPD EXAM: PORTABLE CHEST 1 VIEW COMPARISON:  08/21/2016  FINDINGS: Cardiomediastinal silhouette is stable. Mild hyperinflation. No infiltrate or pulmonary edema. IMPRESSION: No active disease. Electronically Signed   By: Lahoma Crocker M.D.   On: 11/03/2016 19:49    Micro Results     No results found for this or any previous visit (from the past 240 hour(s)).     Today   Subjective:   [name] today has no headache,no chest or abdominal pain,no new weakness tingling  or numbness, feels much better wants to go home today.   Objective:   Blood pressure 126/71, pulse 71, temperature 97.7 F (36.5 C), temperature source Oral, resp. rate 18, height 6' (1.829 m), weight 95.3 kg (210 lb), SpO2 93 %.   Intake/Output Summary (Last 24 hours) at 11/06/16 1243 Last data filed at 11/05/16 1358  Gross per 24 hour  Intake              250 ml  Output                0 ml  Net              250 ml    Exam Awake Alert, Oriented x 3, No new F.N deficits, Normal affect Supple Neck,No JVD. Symmetrical Chest wall movement, Good air movement bilaterally, CTAB RRR,No Gallops,Rubs or new Murmurs, No Parasternal Heave +ve B.Sounds, Abd Soft, Non tender,  No rebound -guarding or rigidity. No Cyanosis, Clubbing or edema, No new Rash or bruise  Data Review   CBC w Diff:  Lab Results  Component Value Date   WBC 17.3 (H) 11/06/2016   HGB 13.7 11/06/2016   HCT 40.1 11/06/2016   PLT 241 11/06/2016   LYMPHOPCT 7 11/04/2016   MONOPCT 0 11/04/2016   EOSPCT 0 11/04/2016   BASOPCT 0 11/04/2016    CMP:  Lab Results  Component Value Date   NA 133 (L) 11/06/2016   K 4.9 11/06/2016   CL 103 11/06/2016   CO2 23 11/06/2016   BUN 26 (H) 11/06/2016   CREATININE 1.14 11/06/2016   CREATININE 1.22 05/07/2016   PROT 5.4 (L) 07/22/2016   ALBUMIN 3.1 (L) 07/22/2016   BILITOT 0.5 07/22/2016   ALKPHOS 54 07/22/2016   AST 18 07/22/2016   ALT 22 07/22/2016  .   Total Time in preparing paper work, data evaluation and todays exam - 35 minutes  [name],  Jacob Chamblee M.D on 11/06/2016 at 12:42 PM  [facility]   Office  [phone]

## 2016-11-06 NOTE — Telephone Encounter (Signed)
Pt calling requesting Rx for Brovana be sent to [facility] Per pt's chart, this was sent electronically on 1.10.18 to [facility] but pt stated that they informed him this was never received.  Assured patient will call [facility] and give Rx verbally.  Pt okay with this and voiced his understanding.  Called [facility] and spoke with pharmacist Ronalee Belts to give verbal Rx.  Rx was run while I was on the phone and stated that Rx needs to be filed under Part B.  Per Ronalee Belts, independent pharmacies no longer file with Medicare Part B because it is cost-prohibitive.  Ronalee Belts checked with another pharmacist at [facility] and they agreed that pt should try to get this from a [facility].  Will call to discuss with patient.

## 2016-11-07 ENCOUNTER — Other Ambulatory Visit: Payer: Self-pay | Admitting: Pharmacist

## 2016-11-07 ENCOUNTER — Telehealth: Payer: Self-pay | Admitting: Family Medicine

## 2016-11-07 ENCOUNTER — Telehealth: Payer: Self-pay | Admitting: Behavioral Health

## 2016-11-07 ENCOUNTER — Other Ambulatory Visit: Payer: Self-pay | Admitting: *Deleted

## 2016-11-07 MED ORDER — ARFORMOTEROL TARTRATE 15 MCG/2ML IN NEBU: 15.0000 ug | INHALATION_SOLUTION | Freq: Two times a day (BID) | RESPIRATORY_TRACT | 0 refills | Status: DC

## 2016-11-07 NOTE — Telephone Encounter (Signed)
Spoke with Benjamine Mola at [facility], aware that sample has been left up front, and aware of rx status.  Will await call back from patient.

## 2016-11-07 NOTE — Telephone Encounter (Signed)
Caller name:[facility] Relationship to patient: Can be [phone] Pharmacy:  Reason for call:Requesting a call back, patient states he is taking lorsartan 50mg  and mexcizine 25mg  2x daily. Pharmacy was unable to find when and by whom this was prescribed.

## 2016-11-07 NOTE — Telephone Encounter (Signed)
Transition Care Management Follow-up Telephone Call  Admission date:  11/03/2016  Admitting Physician  Norval Morton, MD  Discharge Date:  11/06/2016   Primary MD  Shelda Pal, DO  Recommendations for primary care physician for things to follow:  - Please check CBC, BMP during next visit. - Patient will need follow-up with pulmonary in the next 1-2 weeks - Check TSH and free T4 in 6 weeks   Admission Diagnosis  Shortness of breath [R06.02] Moderate persistent asthma with exacerbation [J45.41]   Discharge Diagnosis  Shortness of breath [R06.02] Moderate persistent asthma with exacerbation [J45.41]    Principal Problem:   COPD exacerbation (HCC) Active Problems:   Hypertension   Anxiety   Diabetes mellitus without complication- (diet controlled; w/o proteinuria)   Patient's noncompliance with other medical treatment and regimen   non-specific Chest pain   Asthma exacerbation   How have you been since you were released from the hospital? Patient stated, "I'm doing better".   Do you understand why you were in the hospital? yes   Do you understand the discharge instructions? yes   Where were you discharged to? Home with wife.   Items Reviewed:  Medications reviewed: yes  Allergies reviewed: yes  Dietary changes reviewed: yes, heart-healthy, carb-modified diet Referrals reviewed: yes, follow-up with PCP;  Patient will need follow-up with pulmonary in the next 1-2 weeks     Functional Questionnaire:   Activities of Daily Living (ADLs):   He states they are independent in the following: ambulation, bathing and hygiene, feeding, continence, grooming, toileting and dressing States they require assistance with the following: None   Any transportation issues/concerns?: no   Any patient concerns? yes, patient stated that he's in need of his medication, Brovana, but has not received it due to an insurance issue.   Confirmed importance and  date/time of follow-up visits scheduled yes, 11/08/16 at 11:15 AM.  Provider Appointment booked with Dr. Nani Ravens.  Confirmed with patient if condition begins to worsen call PCP or go to the ER.  Patient was given the office number and encouraged to call back with question or concerns.  : yes

## 2016-11-07 NOTE — Telephone Encounter (Signed)
Spoke with patient and discussed my findings as stated below  Pt voiced his understanding and would like to use [facility] in [place].  He has never used this pharmacy before so will need to see if the pharmacy will be able to accept pt's insurance info over the phone.  Last image of card was uploaded August 2017 and pt stated this is the most recent copy.  Called [facility] and spoke with pharmacist - nebs through Medicare Part B must be faxed to the pharmacy [phone]) with the dx code, provider's signature, NPI and clear directions.  Pt's insurance information can be included as well.  RA is not back in the office until tomorrow morning.  LMOM TCB x1 to discuss with patient.

## 2016-11-07 NOTE — Patient Outreach (Signed)
Receive a call back from Dr. Bari Mantis office. Per Caryl Pina, will leave samples of Brovana for patient at front desk. Brovana prescription will be faxed to [facility] as requested by [facility] tomorrow when Dr. Elsworth Soho is in the office. Caryl Pina reports that she has called Mr. Clara as well and left him a message to let him know.  Harlow Asa, PharmD, [place] [phone]

## 2016-11-07 NOTE — Patient Outreach (Addendum)
Medication reconciliation phone call placed to patient's PCP's office. Left message with Danise Mina in the provider's office to let provider know that patient reports:  1) taking losartan 50 mg daily for his blood pressure. Note that this medication is not currently on his EPIC medication list, discharge summary or mentioned in his recent PCP office visit notes.   2) taking meclizine 25 mg twice daily as this is also not currently on his EPIC medication list. Patient reports that the meclizine was prescribed by Raiford Noble, his previous PCP, for vertigo.  Note that patient has a post-discharge visit with PCP tomorrow. Request follow up with patient.  Will close pharmacy episode at this time.  Harlow Asa, PharmD, [facility] [phone]

## 2016-11-07 NOTE — Patient Outreach (Signed)
Place a call to patient's Pulmonologist regarding patient's Brovana prescription. Per EPIC chart review, [place] requested that prescription be faxed over from Dr. Bari Mantis office in order to be billed through Part B for the patient. Leave a message with Caryl Pina in Dr. Bari Mantis office.  Harlow Asa, PharmD, [place] [facility] [phone]

## 2016-11-07 NOTE — Patient Outreach (Addendum)
[facility]) Care Management  [place]   11/07/2016  JONIE KETTINGER 11-13-1950 [id number]  Subjective: ALEZANDER LYCETT is a 66 y.o. male referred to pharmacy for medication management and review. Called and spoke with patient. HIPAA identifiers verified and verbal consent received.  Mr. Craig reports that he was discharged from the hospital yesterday, following admission for a COPD exacerbation. When asked about his medications, patient reports that he doesn't know what to take or how to take them. However, upon asking patient about his specific medications, patient is good historian of his medications. Review with patient each of his medications including name, dose, administration and indication. Review with patient his allergy list.  Mr. Mccullen reports that he is currently taking budesonide solution via nebulizer twice daily as directed. Patient confirms that he rinses his mouth after each use. Reports that he had been using the [facility]ist with the budesonide, but that his Pulmonologist changed him to [facility] because he was having increased wheezing with the Performist. Reports that he had been having difficulty with getting the Brovana filled at his local pharmacy, but has been told that it will be filled instead at [facility] under Part B of his insurance today. Reports that he has a follow up appointment scheduled with Pulmonology for next week.  Counsel patient to use caution with use of alprazolam and baclofen. Patient reports that he only uses baclofen at bedtime and denies any dizziness with this medication. 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  Reports that he keeps his Epipen on him throughout the day when he works as he is allergic to hornets. States that he has recently checked the expiration dates on his Epipens.  Patient reports that he takes losartan 50 mg daily for his  blood pressure. Note that this medication is not currently on his EPIC medication list, discharge summary or mentioned in his recent PCP office visit notes. Patient reports that he has stopped metoprolol as he was instructed to do at discharge yesterday. Patient reports that he has an appointment tomorrow with his PCP. Let Mr. Brain know that I will follow up with his PCP today regarding the losartan. Patient reports that he does not check his own blood pressure, although he has a monitor. Counsel regarding importance of checking blood pressure and keeping record.  Will also let provider know that the patient is reporting that he is taking meclizine 25 mg twice daily as this is also not currently on his EPIC medication list. Patient reports that the meclizine was prescribed by Raiford Noble, a previous PCP, for vertigo.  Patient reports that he occasionally checks his blood sugar. Reports that it was 107 mg/dL this morning. Reports that he has seen an endocrinologist, Dr. Loanne Drilling, and was instructed to not start metformin yet based on his current A1C and other health concerns.  Patient reports that he is not taking Ability. Reports that he is not willing to take medications like this that he feels like "control him". Reports that he has an upcoming appointment with a psychologist on February 4th and with a  psychiatrist in March.  Patient reports that he does not consistently take his atorvastatin because he feels like he gets reflux after taking it when he lies down to sleep. Counseled patient to try taking this medication instead each morning to avoid this side effect. Mr. Cavanaugh reports that he will give  this a try.  Patient reports that he uses a Fleet enema every three days in order to use the bathroom. Reports that he has been seen by a gastroenterologist, Dr. Henrene Pastor, for his chronic constipation and has been advised to try Miralax again. Reports that he tried it again and continued to have severe  cramping with use. Reports that he has stopped Miralax and started using the enemas again. Advise patient to follow up with Dr. Blanch Media office about this issue again. Patient reports that he will do so as he agrees that there may be something else for him to try.  Patient states that he struggles with medication adherence. 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 Reports that he often realizes at the end of the day that he brought his pills with them, but forgot to take them. Counsel patient on the importance of adherence and discuss strategies for adherence. Advise patient to take his morning pills before leaving the house, rather than taking them with him. Advise patient to consider using a pillbox and calendar so that he will know if he has missed a dose. Patient agrees to try these strategies.    Mr. Klauser reports that he has no further medication questions/concerns at this time. Provide patient with my phone number.  Patient was recently discharged from hospital and all medications have been reviewed.  Objective:   Encounter Medications: Outpatient Encounter Prescriptions as of 11/07/2016  Medication Sig Note  . acetaminophen (TYLENOL) 500 MG tablet Take 500 mg by mouth every 6 (six) hours as needed for headache (pain).   Marland Kitchen albuterol (PROVENTIL HFA;VENTOLIN HFA) 108 (90 Base) MCG/ACT inhaler Inhale 2 puffs into the lungs every 6 (six) hours as needed for wheezing or shortness of breath.   . ALPRAZolam (XANAX) 1 MG tablet Take 0.5 tablets (0.5 mg total) by mouth 2 (two) times daily as needed for anxiety. (Patient taking differently: Take 1 mg by mouth 4 (four) times daily. ) 11/07/2016: Reports taking 0.5 mg three times daily  . atorvastatin (LIPITOR) 40 MG tablet One tab q hs (Patient taking differently: Take 40 mg by mouth at bedtime. )   . baclofen (LIORESAL) 10 MG tablet Take 10 mg by mouth at bedtime.   . bisoprolol (ZEBETA) 5 MG tablet Take 1 tablet (5 mg total) by  mouth daily.   . budesonide (PULMICORT) 0.25 MG/2ML nebulizer solution Take 2 mLs (0.25 mg total) by nebulization 2 (two) times daily.   Marland Kitchen dexlansoprazole (DEXILANT) 60 MG capsule Take 1 capsule (60 mg total) by mouth daily. (Patient taking differently: Take 60 mg by mouth every other day. )   . EPINEPHrine 0.3 mg/0.3 mL IJ SOAJ injection Inject 0.3 mLs (0.3 mg total) into the muscle once. (Patient taking differently: Inject 0.3 mg into the muscle once as needed (severe allergic reactions). )   . fluticasone (FLONASE) 50 MCG/ACT nasal spray Place 2 sprays into both nostrils daily.   Marland Kitchen loratadine (CLARITIN) 10 MG tablet Take 10 mg by mouth daily.   Marland Kitchen losartan (COZAAR) 50 MG tablet Take 50 mg by mouth daily.   . meclizine (ANTIVERT) 25 MG tablet Take 25 mg by mouth 2 (two) times daily.   . nitroGLYCERIN (NITROSTAT) 0.4 MG SL tablet Place 1 tablet (0.4 mg total) under the tongue every 5 (five) minutes as needed for chest pain. 08/21/2016: On hand   . ondansetron (ZOFRAN) 4 MG tablet Take 1 tablet (4 mg total) by mouth every  12 (twelve) hours as needed for nausea or vomiting.   . OXYGEN Inhale 2 L into the lungs at bedtime. Reported on 11/25/2015   . predniSONE (DELTASONE) 10 MG tablet Please take 40 mg oral daily for 3 days, then 30 mg oral daily for 3 days, then 20 mg oral daily for 3 days, then 10 mg oral daily until seen by pulmonary.   . sodium phosphate (FLEET) 7-19 GM/118ML ENEM Place 1 enema rectally as needed for severe constipation.   Marland Kitchen arformoterol (BROVANA) 15 MCG/2ML NEBU Take 2 mLs (15 mcg total) by nebulization 2 (two) times daily. (Patient not taking: Reported on 11/07/2016)   . ARIPiprazole (ABILIFY) 5 MG tablet Take 1 tablet (5 mg total) by mouth daily. (Patient not taking: Reported on 11/07/2016)   . Blood Glucose Monitoring Suppl (ACCU-CHEK AVIVA) device Use to check blood sugars every morning fasting, 2 hours after largest meal and as needed with low blood sugar symptoms    No  facility-administered encounter medications on file as of 11/07/2016.     Assessment:   Drugs sorted by system:  Neurologic/Psychologic: alprazolam, meclizine  Cardiovascular: atorvastatin, bisoprolol, losartan, Nitrostat  Pulmonary/Allergy: albuterol, Brovana, budesonide, Flonase, loratadine, prednisone  Gastrointestinal: Dexilant, Fleet, ondansetron  Pain: acetaminophen, baclofen  Miscellaneous: Epipen   Duplications in therapy: none noted Gaps in therapy: none noted Medications to avoid in the elderly: alprazolam, baclofen Drug interactions: no significant interactions noted Other issues noted:  Marland Kitchen Patient currently without Brovana nebulizer solution . Need for medication reconciliation regarding patient's losartan and meclizine   Plan:  1) Will call to follow up with patient's Pulmonologist regarding patient's Brovana prescription.  2) Will call to follow up with patient's PCP, Dr. Nani Ravens [phone]), regarding whether he should continue losartan 50 mg daily. Will also let provider know that the patient is reporting that he is taking meclizine 25 mg twice daily as this is also not currently on his EPIC medication list.   3) Patient to follow up with his gastroenterologist regarding his chronic constipation and inability to tolerate Miralax.  Harlow Asa, PharmD, [place] [facility] [phone]

## 2016-11-07 NOTE — Telephone Encounter (Signed)
Elizabeth at [facility] is returning phone call

## 2016-11-07 NOTE — Patient Outreach (Signed)
[facility]) Care Management  11/07/2016  [name] 03-16-1951 [id number]  Transition of care call Patient discharged from [facility] on 1/23, Dx. COPD exacerbation.   Placed call to patient as part of transition of care program, explained reason for call transition of care program, patient gave verbal consent.  Patient recalled telephone conversation regarding his medication with [facility] pharmacist Tommy Rainwater on today, and discussed how much better his breathing is when using Brovana and the difficulty he has had with getting prescription filled, but understands to pick up samples at pulmonary office and prescription will be faxed to [place].  Patient discussed his breathing is okay on today, no increase in shortness of breath or wheezing at this time. Patient discussed wanting a smaller oxygen tank for use outside the home, his has a concentrator at home and uses oxygen at 2 liters at night at times, will discuss at pulmonary office visit.    Plan Will continue transition of care weekly outreaches, home visit scheduled in the next week to complete assessments and assess for further needs.   Will send patient welcome letter and MD barrier letter.  [facility] CM Care Plan Problem One   Flowsheet Row Most Recent Value  Care Plan Problem One  Recent hospital admission related to COPD  Role Documenting the Problem One  Care Management [place] for Problem One  Active  [facility] Long Term Goal (31-90 days)  Patient will not experience a hospital admission in the next 60 days   [facility] Long Term Goal Start Date  11/07/16  Interventions for Problem One Long Term Goal  Reviewed discharge instructions, importance in taking medications as prescribed , adhering to MD instructions   [facility] CM Short Term Goal #1 (0-30 days)  Patient will attend post hospital PCP visit in the next 7 days   [facility] CM Short Term Goal #1 Start Date  11/08/16  Interventions for Short Term Goal #1   Discussed importance of PCP follow up   [facility] CM Short Term Goal #2 (0-30 days)  Patient will be able to identify worsening signs of COPD and action plan in the next 30 days   [facility] CM Short Term Goal #2 Start Date  11/08/16  Interventions for Short Term Goal #2  Discussed symptoms in yellow zone to notify MD of sooner.      Joylene Draft, RN, [place] [facility] [phone]- Mobile [phone]- Toll Free Main Office

## 2016-11-07 NOTE — Telephone Encounter (Signed)
Benjamine Mola from [facility] is calling about patient medication Brovana. Benjamine Mola stated patient is completely out of his medication. Elizabeth request for patient to have samples or try to get the medication sent to the pharmacy today. [phone].

## 2016-11-07 NOTE — Telephone Encounter (Signed)
lmtcb X1 for Elizabeth at [facility] for pt. brovana samples left up front for pt- see below as to why rx will not be faxed until tomorrow.

## 2016-11-08 ENCOUNTER — Encounter: Payer: Self-pay | Admitting: *Deleted

## 2016-11-08 ENCOUNTER — Encounter: Payer: Self-pay | Admitting: Family Medicine

## 2016-11-08 ENCOUNTER — Ambulatory Visit (INDEPENDENT_AMBULATORY_CARE_PROVIDER_SITE_OTHER): Payer: Medicare Other | Admitting: Family Medicine

## 2016-11-08 VITALS — BP 112/68 | HR 65 | Temp 97.4°F | Ht 72.0 in | Wt 215.1 lb

## 2016-11-08 DIAGNOSIS — R42 Dizziness and giddiness: Secondary | ICD-10-CM

## 2016-11-08 DIAGNOSIS — J441 Chronic obstructive pulmonary disease with (acute) exacerbation: Secondary | ICD-10-CM | POA: Diagnosis not present

## 2016-11-08 DIAGNOSIS — Z09 Encounter for follow-up examination after completed treatment for conditions other than malignant neoplasm: Secondary | ICD-10-CM

## 2016-11-08 LAB — BASIC METABOLIC PANEL
BUN: 28 mg/dL — ABNORMAL HIGH (ref 6–23)
CO2: 30 mEq/L (ref 19–32)
Calcium: 9.1 mg/dL (ref 8.4–10.5)
Chloride: 102 mEq/L (ref 96–112)
Creatinine, Ser: 1.31 mg/dL (ref 0.40–1.50)
GFR: 58.01 mL/min — AB (ref >60.00)
GLUCOSE: 95 mg/dL (ref 70–99)
POTASSIUM: 4.3 meq/L (ref 3.5–5.1)
SODIUM: 136 meq/L (ref 135–145)

## 2016-11-08 LAB — CBC
HCT: 46.3 % (ref 39.0–52.0)
HEMOGLOBIN: 15.8 g/dL (ref 13.0–17.0)
MCHC: 34.1 g/dL (ref 30.0–36.0)
MCV: 91.8 fl (ref 78.0–100.0)
PLATELETS: 249 10*3/uL (ref 150.0–400.0)
RBC: 5.05 Mil/uL (ref 4.22–5.81)
RDW: 14.2 % (ref 11.5–15.5)
WBC: 9.2 10*3/uL (ref 4.0–10.5)

## 2016-11-08 LAB — GLUCOSE, POCT (MANUAL RESULT ENTRY): POC GLUCOSE: 98 mg/dL (ref 70–99)

## 2016-11-08 NOTE — Telephone Encounter (Signed)
Before I call pt back need to know if RA signed his rx while he was here today so we can give him an update  Cherina do you know?  Please advise thanks

## 2016-11-08 NOTE — Patient Instructions (Addendum)
Make sure you follow up with your lung doctor in the next 1-2 weeks.  Call your insurance about the Shingrix vaccine (the new Shingles vaccine).

## 2016-11-08 NOTE — Telephone Encounter (Signed)
He's coming in today, will address.

## 2016-11-08 NOTE — Progress Notes (Addendum)
Chief Complaint  Patient presents with  . Hospitalization Follow-up    COPD    HPI Pt is a 66 y.o. male who presents for a transition of care visit.  Pt was discharged from [facility] on 11/06/16.  Within 48 business hours of discharge our office contacted him via telephone to coordinate his care and needs.   Pt reports feeling well today. He hadDifficulty breathing upon admission. He is currently on a steroid taper and is feeling much better. He was having issues getting ahold of an inhaler Garlon Hatchet) from his pulmonology office due to cost of medication. He has a follow-up appointment with his pulmonologist next week. He reports he feels lightheaded today. His oral intake has not been as high because of poor appetite since being discharged. Denies any palpitations, sweating, or fevers.  Social History   Social History  . Marital status: Married   Occupational History  . Retired    Social History Main Topics  . Smoking status: Former Smoker    Years: 47.00    Quit date: 08/25/2013  . Smokeless tobacco: Former Systems developer  . Alcohol use 3.6 oz/week    6 Cans of beer per week     Comment: daily  . Drug use: No     Comment: hx of marijuana use   . Sexual activity: Yes    Birth control/ protection: None   Past Medical History:  Diagnosis Date  . Anxiety   . Anxiety disorder   . Arthritis    BILATERAL SHOULDERS, ELBOWS AND HANDS AND LEFT HIP AND KNEES--HX CORTISONE SHOTS IN SHOULDERS, ELBOWS, HIP AND KNEES  . Basal cell carcinoma of back 04/30/2016   Dermatologist- Dr Nevada Crane;   MOHS sx- Dr Levada Dy   . Bladder outlet obstruction   . Chronic idiopathic constipation 05/08/2015  . Complication of anesthesia    DIFFICULT WAKING   . COPD (chronic obstructive pulmonary disease) Gold C Frequent exacerbations 01/13/2014   [name] 07/08/14: FeV1 51% FeV1/FVC 66% FVC 59% 10/5/2015ONO RA was normal  10/13/2014  ONO on RA NORMAL   . COPD, frequent exacerbations ([place])    pulmologist-  dr Joya Gaskins--   Girtha Rm Stage C.04-25-15 recent COPD exacerbation-much improved now, after tx. in ER [facility].  Marland Kitchen Depression   . Diabetes mellitus without complication ([place])    BODERLINE - DIET CONTROL  . Dysrhythmia    PVC'S  . Family history of adverse reaction to anesthesia    father would wake up with agitation   . Former smoker 01/13/2014  . Gastroesophageal reflux disease without esophagitis   . GERD (gastroesophageal reflux disease)   . Heavy alcohol use 04/30/2016  . History of chronic bronchitis   . History of oxygen administration    oxygen use 2 l/m nasally at bedtime and exertional occasions  . History of rheumatic fever   . History of TB (tuberculosis)    1984--  hospitalized for 4 month treatment  . History of urinary retention   . Hx of multiple concussions    x 2 per patient   . Hypertension   . Melanoma ([place])   . Nocturnal oxygen desaturation    USES O2 NIGHTLY  . OSA (obstructive sleep apnea) 01/13/2014  . Pneumonia    hx of   . PONV (postoperative nausea and vomiting)   . Post-polio syndrome    polio at age 83--PT WAS IN IRON LUNG; PT WAS IN W/C UNTIL AGE 41; STILL HAS WEAKNESS RIGHT SIDE  . Prostate cancer (  [place])   . Prostate cancer ([place])   . Schizophrenia ([place])   . Shortness of breath dyspnea    RIGHT HEMIDIAPHRAGM ELEVATION - POST POLIO SYNDROME  . Tuberculosis    Hosp 4 months rx , left early    Family History  Problem Relation Age of Onset  . Alzheimer's disease Father 4    Deceased  . Stomach cancer Father   . Heart attack Father   . Heart disease Father   . Skin cancer Mother     Facial-Living  . Cancer Mother     skin  . Alcohol abuse Sister     x2  . Mental illness Sister     x2  . Alcohol abuse Sister   . Diabetes Maternal Aunt     x2  . Thyroid disease Maternal Aunt     x4  . Diabetes Maternal Uncle   . Tuberculosis Paternal Grandfather   . Tuberculosis Paternal Grandmother   . Alzheimer's disease Paternal Aunt   . Alzheimer's disease Paternal  Uncle   . Colon cancer Neg Hx   . Colon polyps Neg Hx   . Crohn's disease Neg Hx   . Ulcerative colitis Neg Hx    Allergies as of 11/08/2016      Reactions   Aspirin Anaphylaxis   Hornet Venom Anaphylaxis   Ivp Dye [iodinated Diagnostic Agents] Anaphylaxis   Levaquin [levofloxacin In D5w] Shortness Of Breath, Swelling   In addition: sweating, chest pain, and diarrhea.    Nsaids Anaphylaxis   Penicillins Anaphylaxis   Heart stops Has patient had a PCN reaction causing immediate rash, facial/tongue/throat swelling, SOB or lightheadedness with hypotension: yes Has patient had a PCN reaction causing severe rash involving mucus membranes or skin necrosis: yes Has patient had a PCN reaction that required hospitalization yes Has patient had a PCN reaction occurring within the last 10 years: No If all of the above answers are "NO", then may proceed with Cephalosporin use.   Tolmetin Anaphylaxis   Perforomist [formoterol]    Increased wheezing, shortness of breath   Buprenorphine Hcl Nausea And Vomiting   Can take with zofran    Morphine And Related Nausea And Vomiting   Can take with zofran    Oxycodone Itching, Rash      Medication List       Accurate as of 11/08/16 11:55 AM. Always use your most recent med list.          ACCU-CHEK AVIVA device Use to check blood sugars every morning fasting, 2 hours after largest meal and as needed with low blood sugar symptoms   acetaminophen 500 MG tablet Commonly known as:  TYLENOL Take 500 mg by mouth every 6 (six) hours as needed for headache (pain).   albuterol 108 (90 Base) MCG/ACT inhaler Commonly known as:  PROVENTIL HFA;VENTOLIN HFA Inhale 2 puffs into the lungs every 6 (six) hours as needed for wheezing or shortness of breath.   ALPRAZolam 1 MG tablet Commonly known as:  XANAX Take 0.5 tablets (0.5 mg total) by mouth 2 (two) times daily as needed for anxiety.   arformoterol 15 MCG/2ML Nebu Commonly known as:  BROVANA Take 2  mLs (15 mcg total) by nebulization 2 (two) times daily.   arformoterol 15 MCG/2ML Nebu Commonly known as:  BROVANA Take 2 mLs (15 mcg total) by nebulization 2 (two) times daily.   ARIPiprazole 5 MG tablet Commonly known as:  ABILIFY Take 1 tablet (5 mg total) by mouth daily.  atorvastatin 40 MG tablet Commonly known as:  LIPITOR One tab q hs   baclofen 10 MG tablet Commonly known as:  LIORESAL Take 10 mg by mouth at bedtime.   bisoprolol 5 MG tablet Commonly known as:  ZEBETA Take 1 tablet (5 mg total) by mouth daily.   budesonide 0.25 MG/2ML nebulizer solution Commonly known as:  PULMICORT Take 2 mLs (0.25 mg total) by nebulization 2 (two) times daily.   dexlansoprazole 60 MG capsule Commonly known as:  DEXILANT Take 1 capsule (60 mg total) by mouth daily.   EPINEPHrine 0.3 mg/0.3 mL Soaj injection Commonly known as:  EPI-PEN Inject 0.3 mLs (0.3 mg total) into the muscle once.   fluticasone 50 MCG/ACT nasal spray Commonly known as:  FLONASE Place 2 sprays into both nostrils daily.   loratadine 10 MG tablet Commonly known as:  CLARITIN Take 10 mg by mouth daily.   losartan 50 MG tablet Commonly known as:  COZAAR Take 50 mg by mouth daily.   meclizine 25 MG tablet Commonly known as:  ANTIVERT Take 25 mg by mouth 2 (two) times daily.   nitroGLYCERIN 0.4 MG SL tablet Commonly known as:  NITROSTAT Place 1 tablet (0.4 mg total) under the tongue every 5 (five) minutes as needed for chest pain.   ondansetron 4 MG tablet Commonly known as:  ZOFRAN Take 1 tablet (4 mg total) by mouth every 12 (twelve) hours as needed for nausea or vomiting.   OXYGEN Inhale 2 L into the lungs at bedtime. Reported on 11/25/2015   predniSONE 10 MG tablet Commonly known as:  DELTASONE Please take 40 mg oral daily for 3 days, then 30 mg oral daily for 3 days, then 20 mg oral daily for 3 days, then 10 mg oral daily until seen by pulmonary.   sodium phosphate 7-19 GM/118ML Enem Place  1 enema rectally as needed for severe constipation.       ROS:  Constitutional: No fevers or chills, no weight loss HEENT: No headaches, hearing loss, or runny nose, no sore throat Heart: No Current chest pain, no palpitations Lungs: No SOB, no cough Abd: No bowel changes from normal, no pain, no N/V GU: + Urinary hesitancy Neuro: No numbness, tingling or weakness, + headedness Msk: No joint or muscle pain  Objective BP 112/68 (BP Location: Left Arm, Patient Position: Sitting, Cuff Size: Large)   Pulse 65   Temp 97.4 F (36.3 C) (Oral)   Ht 6' (1.829 m)   Wt 215 lb 2 oz (97.6 kg)   SpO2 96%   BMI 29.18 kg/m  General Appearance:  awake, alert, oriented, in no acute distress and well developed, well nourished Skin:  there are no suspicious lesions or rashes of concern Head/face:  NCAT Eyes:  EOMI, PERRLA Ears:  canals and TMs NI Nose/Sinuses:  negative Mouth/Throat:  Mucosa moist, no lesions; pharynx without erythema, edema or exudate. Neck:  neck- supple, no mass, non-tender and no jvd Lungs: Clear to auscultation.  No rales, rhonchi, or wheezing. Normal effort, no accessory muscle use. Heart:  Heart sounds are normal.  Regular rate and rhythm without murmur, gallop or rub. No bruits. Abdomen:  BS+, soft, tenderness to palpation in the left lower quadrant (chronic), ND, no masses or organomegaly Musculoskeletal:  No muscle group atrophy or asymmetry, gait normal Neurologic:  Alert and oriented x 3, gait normal., strength and  sensation grossly normal Psych exam: Nml mood and affect, age appropriate judgment and insight  COPD exacerbation ([place]) -  Plan: EKG 12-Lead, POCT Glucose (CBG)  Hospital discharge follow-up - Plan: Basic Metabolic Panel (BMET), EKG 12-Lead, POCT Glucose (CBG)  Light headedness - Plan: CBC, EKG 12-Lead, POCT Glucose (CBG)  Discharge summary and medication list have been reviewed/reconciled.  Labs pending at the time of discharge have been reviewed or  are still pending at the time of this visit.  Follow-up labs and appointments have been ordered and/or coordinated appropriately.  TRANSITIONAL CARE MANAGEMENT CERTIFICATION:  I certify the following are true:   1. Communication with the patient/care giver was made within 2 business days of discharge.  2. Complexity of Medical decision making is moderate.  3. Face to face visit occurred within 14 days of discharge.  4. Pt encouraged to keep f/u appt with pulmonology 5. Encouraged to keep appt with urology  EKG WNL. BG WNL. Will obtain labs. F/u if no improvement, could be related to poor intake, orthostasis. F/u as originally scheduled with me for CPE. The patient voiced understanding and agreement to the plan.  [place], DO 11/08/16 11:55 AM

## 2016-11-08 NOTE — Telephone Encounter (Signed)
[phone] pt calling back wants to talk to [facility]

## 2016-11-08 NOTE — Progress Notes (Signed)
Pre visit review using our clinic review tool, if applicable. No additional management support is needed unless otherwise documented below in the visit note. 

## 2016-11-09 ENCOUNTER — Telehealth: Payer: Self-pay | Admitting: Pulmonary Disease

## 2016-11-09 NOTE — Telephone Encounter (Signed)
Spoke with Caren Griffins this morning stating that the form has been faxed to her. Will hold the form in my box until 11/16/15. Nothing else needed.

## 2016-11-09 NOTE — Telephone Encounter (Signed)
Spoke with patient and informed him that he had a sample placed up front for him. Also inform him that the RX has been sent to [facility] and they are currently working on it. Patient verbalized understanding and had no further questions.

## 2016-11-09 NOTE — Telephone Encounter (Signed)
RA signed the telephone for [place] today and it was faxed over to [facility]. I called Jenny Reichmann to confirm that she has received the fax. The sample for patient is up front if he wants to come and pick it up.

## 2016-11-14 ENCOUNTER — Encounter: Payer: Self-pay | Admitting: Pulmonary Disease

## 2016-11-14 ENCOUNTER — Ambulatory Visit (INDEPENDENT_AMBULATORY_CARE_PROVIDER_SITE_OTHER): Payer: Medicare Other | Admitting: Pulmonary Disease

## 2016-11-14 DIAGNOSIS — J441 Chronic obstructive pulmonary disease with (acute) exacerbation: Secondary | ICD-10-CM | POA: Diagnosis not present

## 2016-11-14 DIAGNOSIS — J449 Chronic obstructive pulmonary disease, unspecified: Secondary | ICD-10-CM | POA: Diagnosis not present

## 2016-11-14 MED ORDER — PREDNISONE 10 MG PO TABS: 10.0000 mg | ORAL_TABLET | Freq: Every day | ORAL | 0 refills | Status: DC

## 2016-11-14 MED ORDER — ALBUTEROL SULFATE (2.5 MG/3ML) 0.083% IN NEBU: 2.5000 mg | INHALATION_SOLUTION | Freq: Four times a day (QID) | RESPIRATORY_TRACT | 12 refills | Status: DC | PRN

## 2016-11-14 NOTE — Addendum Note (Signed)
Addended by: Valerie Salts on: 11/14/2016 10:32 AM   Modules accepted: Orders

## 2016-11-14 NOTE — Patient Instructions (Signed)
Prednisone 10 mg daily for one month Samples of Brovana given until you get your shipment-take twice daily  Rx for albuterol nebs 4 times daily as needed for emergency  If you have breathing gets worse, do the following -Take albuterol nebulizer -Take an extra dose of prednisone -Call our office for an acute visit -If feeling really bad, then go to the emergency room

## 2016-11-14 NOTE — Assessment & Plan Note (Signed)
Prednisone 10 mg daily for one month Samples of Brovana given until you get your shipment-take twice daily  Referral to pulmonary rehabilitation  Needs repeat PFTs once his acute symptoms have resolved

## 2016-11-14 NOTE — Assessment & Plan Note (Signed)
Rx for albuterol nebs 4 times daily as needed for emergency  If you have breathing gets worse, do the following -Take albuterol nebulizer -Take an extra dose of prednisone -Call our office for an acute visit -If feeling really bad, then go to the emergency room

## 2016-11-14 NOTE — Progress Notes (Signed)
Subjective:    Patient ID: [name], male    DOB: 1951-02-20, 66 y.o.   MRN: [id number]  HPI  66 yo former smoker quit 2014 with COPD  and mild  OSA .  Post polio syndrome as a child   11/14/2016  Chief Complaint  Patient presents with  . Follow-up    4wk f/u. Pt was in the hospital recently.    His breathing has been terrible for the last few months.  He had an acute office visit in 08/2016 and was treated with prednisone for a COPD flare-he was also hospitalized 11/03/16 for 3 days and discharged on prednisone taper  He did not tolerate breo, hence he was changed to Pulmicort and Perforomist-he had side effects with Perforomist, hence this was changed to brovana but since this is not on his formulary, he has not been able to get this for almost 2 months now in spite of multiple back and forth to medications between our office and the DME He finally tells me that the shipment is due this Friday.  He had been unable to start on pulmonary rehabilitation He was treated with IV antibiotic string is a hospitalization   He had a sleep study in 11/2015 and which showed mild OSA, he did not want CPAP therapy  Significant tests/ events   Spiro 07/08/14: FeV1 51% FeV1/FVC 66% FVC 59% 10/13/2014 ONO on RA NORMAL  HST 11/2015 AHI 8/h  Echo 06/2016 nml LV fn 07/2016 negative myocardial perfusion study 08/2016 negative Holter for 30 days   Past Medical History:  Diagnosis Date  . Anxiety   . Anxiety disorder   . Arthritis    BILATERAL SHOULDERS, ELBOWS AND HANDS AND LEFT HIP AND KNEES--HX CORTISONE SHOTS IN SHOULDERS, ELBOWS, HIP AND KNEES  . Basal cell carcinoma of back 04/30/2016   Dermatologist- Dr Nevada Crane;   MOHS sx- Dr Levada Dy   . Bladder outlet obstruction   . Chronic idiopathic constipation 05/08/2015  . Complication of anesthesia    DIFFICULT WAKING   . COPD (chronic obstructive pulmonary disease) Gold C Frequent exacerbations 01/13/2014   [name] 07/08/14: FeV1  51% FeV1/FVC 66% FVC 59% 10/5/2015ONO RA was normal  10/13/2014  ONO on RA NORMAL   . COPD, frequent exacerbations ([place])    pulmologist-  dr Joya Gaskins--  Girtha Rm Stage C.04-25-15 recent COPD exacerbation-much improved now, after tx. in ER [facility].  Marland Kitchen Depression   . Diabetes mellitus without complication ([place])    BODERLINE - DIET CONTROL  . Dysrhythmia    PVC'S  . Family history of adverse reaction to anesthesia    father would wake up with agitation   . Former smoker 01/13/2014  . Gastroesophageal reflux disease without esophagitis   . GERD (gastroesophageal reflux disease)   . Heavy alcohol use 04/30/2016  . History of chronic bronchitis   . History of oxygen administration    oxygen use 2 l/m nasally at bedtime and exertional occasions  . History of rheumatic fever   . History of TB (tuberculosis)    1984--  hospitalized for 4 month treatment  . History of urinary retention   . Hx of multiple concussions    x 2 per patient   . Hypertension   . Melanoma ([place])   . Nocturnal oxygen desaturation    USES O2 NIGHTLY  . OSA (obstructive sleep apnea) 01/13/2014  . Pneumonia    hx of   . PONV (postoperative nausea and vomiting)   . Post-polio  syndrome    polio at age 68--PT WAS IN IRON LUNG; PT WAS IN W/C UNTIL AGE 88; STILL HAS WEAKNESS RIGHT SIDE  . Prostate cancer ([place])   . Prostate cancer ([place])   . Schizophrenia ([place])   . Shortness of breath dyspnea    RIGHT HEMIDIAPHRAGM ELEVATION - POST POLIO SYNDROME  . Tuberculosis    Hosp 4 months rx , left early        Review of Systems neg for any significant sore throat, dysphagia, itching, sneezing, nasal congestion or excess/ purulent secretions, fever, chills, sweats, unintended wt loss, pleuritic or exertional cp, hempoptysis, orthopnea pnd or change in chronic leg swelling. Also denies presyncope, palpitations, heartburn, abdominal pain, nausea, vomiting, diarrhea or change in bowel or urinary habits, dysuria,hematuria, rash,  arthralgias, visual complaints, headache, numbness weakness or ataxia.     Objective:   Physical Exam  Gen. Pleasant, well-nourished, in no distress, normal affect ENT - no lesions, no post nasal drip Neck: No JVD, no thyromegaly, no carotid bruits Lungs: no use of accessory muscles, no dullness to percussion, clear without rales or rhonchi  Cardiovascular: Rhythm regular, heart sounds  normal, no murmurs or gallops, no peripheral edema Abdomen: soft and non-tender, no hepatosplenomegaly, BS normal. Musculoskeletal: No deformities, no cyanosis or clubbing Neuro:  alert, non focal       Assessment & Plan:

## 2016-11-15 ENCOUNTER — Telehealth: Payer: Self-pay | Admitting: Pulmonary Disease

## 2016-11-15 DIAGNOSIS — J439 Emphysema, unspecified: Secondary | ICD-10-CM

## 2016-11-15 NOTE — Telephone Encounter (Signed)
Patient was seen yesterday and this was addressed during visit.

## 2016-11-15 NOTE — Telephone Encounter (Signed)
lmomtcb x1 

## 2016-11-15 NOTE — Telephone Encounter (Signed)
[name] please advise if this has been taken care of. Thanks.

## 2016-11-16 ENCOUNTER — Other Ambulatory Visit: Payer: Self-pay | Admitting: *Deleted

## 2016-11-16 ENCOUNTER — Encounter: Payer: Self-pay | Admitting: *Deleted

## 2016-11-16 NOTE — Patient Outreach (Addendum)
[name]) Care Management   11/16/2016  [name] 03-Dec-1950 [id number]  [name] is an 66 y.o. male  Subjective:  Patient discussed his recent visit to PCP and pulmonary doctors. Patient reports that he finally received his Brovana through mail on today.. Patient discussed being on prednisone and recent weight gain.  Patient discussed this is a usual day, as far as his breathing on today, patient states he has taken his medications on this morning.   Objective:  BP 128/74 (BP Location: Right Arm, Patient Position: Sitting, Cuff Size: Large)   Pulse 70   Resp 18   SpO2 97%  Review of Systems  Constitutional: Negative.   HENT: Negative.   Eyes: Negative.   Respiratory: Positive for wheezing.   Cardiovascular: Negative.   Gastrointestinal: Negative.   Genitourinary: Negative.   Musculoskeletal: Negative.   Skin: Negative.   Neurological: Negative.   Endo/Heme/Allergies: Negative.   Psychiatric/Behavioral: Positive for depression. The patient is nervous/anxious.     Physical Exam  Constitutional: He is oriented to person, place, and time. He appears well-developed and well-nourished.  Cardiovascular: Normal rate and normal heart sounds.   Respiratory: Effort normal. He has wheezes.  Mild expiratory wheeze   GI: Soft.  Neurological: He is alert and oriented to person, place, and time.  Skin: Skin is warm and dry.  Psychiatric: He has a normal mood and affect. His behavior is normal. Judgment and thought content normal.    Encounter Medications:   Outpatient Encounter Prescriptions as of 11/16/2016  Medication Sig Note  . acetaminophen (TYLENOL) 500 MG tablet Take 500 mg by mouth every 6 (six) hours as needed for headache (pain).   Marland Kitchen albuterol (PROVENTIL HFA;VENTOLIN HFA) 108 (90 Base) MCG/ACT inhaler Inhale 2 puffs into the lungs every 6 (six) hours as needed for wheezing or shortness of breath.   Marland Kitchen albuterol (PROVENTIL) (2.5 MG/3ML) 0.083%  nebulizer solution Take 2.5 mg by nebulization every 6 (six) hours as needed for wheezing or shortness of breath.   Marland Kitchen albuterol (PROVENTIL) (2.5 MG/3ML) 0.083% nebulizer solution Take 3 mLs (2.5 mg total) by nebulization every 6 (six) hours as needed for wheezing or shortness of breath.   . ALPRAZolam (XANAX) 1 MG tablet Take 0.5 tablets (0.5 mg total) by mouth 2 (two) times daily as needed for anxiety. (Patient taking differently: Take 1 mg by mouth 4 (four) times daily. ) 11/07/2016: Reports taking 0.5 mg three times daily  . arformoterol (BROVANA) 15 MCG/2ML NEBU Take 2 mLs (15 mcg total) by nebulization 2 (two) times daily.   . ARIPiprazole (ABILIFY) 5 MG tablet Take 1 tablet (5 mg total) by mouth daily.   Marland Kitchen atorvastatin (LIPITOR) 40 MG tablet One tab q hs   . baclofen (LIORESAL) 10 MG tablet Take 10 mg by mouth at bedtime.   . bisoprolol (ZEBETA) 5 MG tablet Take 1 tablet (5 mg total) by mouth daily.   . Blood Glucose Monitoring Suppl (ACCU-CHEK AVIVA) device Use to check blood sugars every morning fasting, 2 hours after largest meal and as needed with low blood sugar symptoms   . budesonide (PULMICORT) 0.25 MG/2ML nebulizer solution Take 2 mLs (0.25 mg total) by nebulization 2 (two) times daily.   Marland Kitchen dexlansoprazole (DEXILANT) 60 MG capsule Take 1 capsule (60 mg total) by mouth daily. (Patient taking differently: Take 60 mg by mouth every other day. )   . EPINEPHrine 0.3 mg/0.3 mL IJ SOAJ injection Inject 0.3 mLs (0.3 mg total)  into the muscle once. (Patient taking differently: Inject 0.3 mg into the muscle once as needed (severe allergic reactions). )   . fluticasone (FLONASE) 50 MCG/ACT nasal spray Place 2 sprays into both nostrils daily.   Marland Kitchen HYDROcodone-acetaminophen (NORCO/VICODIN) 5-325 MG tablet Take 1 tablet by mouth every 6 (six) hours as needed for moderate pain.   Marland Kitchen loratadine (CLARITIN) 10 MG tablet Take 10 mg by mouth daily.   . nitroGLYCERIN (NITROSTAT) 0.4 MG SL tablet Place 1 tablet  (0.4 mg total) under the tongue every 5 (five) minutes as needed for chest pain. 08/21/2016: On hand   . ondansetron (ZOFRAN) 4 MG tablet Take 1 tablet (4 mg total) by mouth every 12 (twelve) hours as needed for nausea or vomiting.   . OXYGEN Inhale 2 L into the lungs at bedtime. Reported on 11/25/2015   . predniSONE (DELTASONE) 10 MG tablet Please take 40 mg oral daily for 3 days, then 30 mg oral daily for 3 days, then 20 mg oral daily for 3 days, then 10 mg oral daily until seen by pulmonary.   . sodium phosphate (FLEET) 7-19 GM/118ML ENEM Place 1 enema rectally as needed for severe constipation.   Marland Kitchen arformoterol (BROVANA) 15 MCG/2ML NEBU Take 2 mLs (15 mcg total) by nebulization 2 (two) times daily. (Patient not taking: Reported on 11/16/2016)   . losartan (COZAAR) 50 MG tablet Take 50 mg by mouth daily. 11/16/2016: Not taking  . meclizine (ANTIVERT) 25 MG tablet Take 25 mg by mouth 2 (two) times daily. 11/16/2016: Not taking  . predniSONE (DELTASONE) 10 MG tablet Take 1 tablet (10 mg total) by mouth daily with breakfast. (Patient not taking: Reported on 11/16/2016)    No facility-administered encounter medications on file as of 11/16/2016.   Patient was recently discharged from hospital and all medications have been reviewed.  Functional Status:   In your present state of health, do you have any difficulty performing the following activities: 11/16/2016 11/04/2016  Hearing? N N  Vision? N N  Difficulty concentrating or making decisions? Y N  Walking or climbing stairs? Y N  Dressing or bathing? N N  Doing errands, shopping? N N  Preparing Food and eating ? N -  Using the Toilet? N -  In the past six months, have you accidently leaked urine? N -  Do you have problems with loss of bowel control? N -  Managing your Medications? N -  Managing your Finances? N -  Housekeeping or managing your Housekeeping? N -  Some recent data might be hidden    Fall/Depression Screening:    PHQ 2/9 Scores 11/16/2016  06/28/2016 12/12/2015 11/22/2014  PHQ - 2 Score 1 0 2 0  PHQ- 9 Score - - 9 -  Exception Documentation - - Medical reason -    Fall Risk  11/16/2016 06/28/2016 12/12/2015 11/22/2014  Falls in the past year? Yes Yes Yes Yes  Number falls in past yr: 1 2 or more 2 or more 1  Injury with Fall? Yes Yes Yes Yes  Risk Factor Category  High Fall Risk - - High Fall Risk  Risk for fall due to : Impaired balance/gait;History of fall(s) Other (Comment) Impaired balance/gait History of fall(s)  Risk for fall due to (comments): - carelessness vs etoh 1 Ran into a branch,2. Backed off a porch -  Follow up Falls evaluation completed;Education provided Falls prevention discussed Falls prevention discussed -   Assessment:    COPD  No increase in shortness  of breath or cough, talking in complete sentences able to do his normal routine at home, wearing oxygen at night. Will  benefit from COPD self care management education on action plan.    Medications  Keeps all daily medications in a plastic bin, removed expired medication from box. Patient declined assistance with setting up pill a organizer, his current system works best for him. Reports taking medications prescribed.    Moderate fall risk-  Does not require use of cane, has limp due to history of polio with weakness of left side. 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  Depression History of depression  Has rescheduled visit at behavorial health.  Weight gain  Wants to lose weight and requesting information on healthy eating plan.   Provided/Education on  COPD EMMI on When to call and what to do. Provided [facility] book and reviewed.  Patient centered Care planning and goal setting at visit .    Plan:  Will continue weekly transition of care outreaches, next call in a week.  Patient will keep all scheduled MD appointment, and notify MD of worsening of COPD symptoms .   Joylene Draft, RN, [facility] [phone] Mobile [phone]- Toll Free Main Office

## 2016-11-19 ENCOUNTER — Ambulatory Visit (INDEPENDENT_AMBULATORY_CARE_PROVIDER_SITE_OTHER): Payer: Medicare Other | Admitting: Clinical

## 2016-11-19 ENCOUNTER — Telehealth: Payer: Self-pay | Admitting: Family Medicine

## 2016-11-19 DIAGNOSIS — N5201 Erectile dysfunction due to arterial insufficiency: Secondary | ICD-10-CM | POA: Diagnosis not present

## 2016-11-19 DIAGNOSIS — F3162 Bipolar disorder, current episode mixed, moderate: Secondary | ICD-10-CM

## 2016-11-19 DIAGNOSIS — F1020 Alcohol dependence, uncomplicated: Secondary | ICD-10-CM

## 2016-11-19 DIAGNOSIS — R441 Visual hallucinations: Secondary | ICD-10-CM

## 2016-11-19 DIAGNOSIS — C61 Malignant neoplasm of prostate: Secondary | ICD-10-CM | POA: Diagnosis not present

## 2016-11-19 NOTE — Telephone Encounter (Signed)
Spoke with pt, requesting a referral for pulmonary rehab. RA ok to place referral?  Thanks

## 2016-11-19 NOTE — Telephone Encounter (Signed)
OK 

## 2016-11-19 NOTE — Telephone Encounter (Signed)
Patient has a AWV with Wynetta Fines, RN from [place] on 12/18/2016, patient of Dr. Nani Ravens.

## 2016-11-19 NOTE — Telephone Encounter (Signed)
lmtcb x1 for pt. 

## 2016-11-20 ENCOUNTER — Encounter (HOSPITAL_COMMUNITY): Payer: Self-pay | Admitting: Clinical

## 2016-11-20 NOTE — Telephone Encounter (Signed)
AWV should be scheduled with our office since he is a patient of this office now. Insurance requires that PCP be physically present in the office at the time of AWV. Thank you.

## 2016-11-20 NOTE — Telephone Encounter (Deleted)
I didn't schedule the appointment unsure if patient had approval form PCP appointment was scheduled by RN, I will contact patient.

## 2016-11-20 NOTE — Telephone Encounter (Signed)
Spoke with pt. He is aware that we will be making this referral for him.  RA - please specify what class of COPD the pt has. Thanks.

## 2016-11-20 NOTE — Progress Notes (Signed)
   THERAPIST PROGRESS NOTE  Session Time: 9:00 -9:55 am  Participation Level: Active  Behavioral Response: CasualAlertAngry and Depressed  Type of Therapy: Individual Therapy  Treatment Goals addressed: Review and up date assessment  Interventions:  Motivational Interviewing,   Summary: [name] is a 66 y.o. male who presents with Bipolar 1 disorder, mixed moderate, visual hallucinations , and Alcohol use disorder, moderate, dependence,  Suicidal/Homicidal: No -without intent/plan  Therapist Response:  [name] met with clinician for an individual session. [name] discussed his psychiatric symptoms, his current life events. [name] has not been in therapy for the past 6 months so client and clinician worked together to update his assessment.   Plan: Return again in 1 weeks.  Diagnosis: Axis I: Bipolar 1 disorder, mixed moderate, visual hallucinations and Alcohol use disorder, moderate, dependence,     [name] A, LCSW 11/20/2016

## 2016-11-20 NOTE — Telephone Encounter (Signed)
lvm advising patient of message below, awaiting call back °

## 2016-11-20 NOTE — Telephone Encounter (Signed)
Order placed. Nothing further needed at this time. 

## 2016-11-20 NOTE — Telephone Encounter (Signed)
emphysema
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
## 2016-11-23 ENCOUNTER — Other Ambulatory Visit: Payer: Self-pay | Admitting: *Deleted

## 2016-11-23 NOTE — Patient Outreach (Signed)
[facility]) Care Management  11/23/2016  SEARS HAAG 1951-02-01 [id number]   Transition of care call  Spoke with patient on today, reports feeling better today than yesterday, more wheezing on yesterday, used albuterol nebulizer twice yesterday.  Patient discussed repairman coming to home today to evaluate heating duct system under the home to clean out mold if found. Patient discussed he will leave home on today while work being done at home and will take his portable nebulizer and medication with him in case needed,. Patient discussed he will attend orientation class for Pulmonary rehab on next week and looking forward to that. Patient discussed seeing counselor on this week and will see weekly until his appointment with psychiatrist in March.    Plan Patient will continue to take medications as prescribed and notify MD of worsening of COPD symptoms Will follow up with patient by telephone in the next week as part of transition of care .  Joylene Draft, RN, [facility] [phone]- Mobile [phone]- Toll Free Main Office

## 2016-11-27 NOTE — Telephone Encounter (Signed)
Patient scheduled medicare wellness with Dr. Nani Ravens,

## 2016-11-29 ENCOUNTER — Other Ambulatory Visit: Payer: Self-pay | Admitting: *Deleted

## 2016-11-29 NOTE — Patient Outreach (Signed)
[facility]) Care Management  11/29/2016  [name] Dec 09, 1950 [id number]   Transition of care call  Spoke with patient, reports he has been out working outside on today, to keep from being in the house all day.  Patient discussed he has some shortness of breath, taking breaks at times, reports taking his nebulizer medication with relief. Patient discussed that he thought the Garlon Hatchet was going to be the best thing for him, states he is not sure now.  Patient reports he is now using a pill organizer for his medication and his wife is helping him. Patient discussed prednisone is causing him to eat more, but discussed he knows it helps his breathing. Reinforced COPD worsening symptoms to notify MD of and action plan to follow patient verbalized understanding.    [name] discussed that he forgot his counselor appointment at [facility] and has been told he will not be able to reschedule. Patient discussed he needs to be able to keep seeing counselor due to his depression. Requested assistance with being able to continue to speak with a counselor.     Plan Patient will seek medical attention for increased shortness of breath and 911 for emergency. Placed call to [facility] appointments to discuss patient stating that he would not be able to make another appointment with counselor, she advised me to have patient call [phone]  Appointments to reschedule next visit . Attempted to return call to patient, no answer able to leave a Hipaa compliant message requesting a return call.  Will place return call to patient in the next 2 business days.    Joylene Draft, RN, [place] Management [phone]- Mobile [phone]- Toll Free Main Office

## 2016-12-03 ENCOUNTER — Other Ambulatory Visit: Payer: Self-pay | Admitting: *Deleted

## 2016-12-03 NOTE — Patient Outreach (Signed)
[facility]) Care Management  12/03/2016  [name] 28-Aug-1951 [id number]  Transition of care call  Placed call to patient no answer, able to leave a hipaa compliant message requesting a return call.  Plan Will await response if no response will return call in the next week.   Joylene Draft, RN, [place] Management [phone]- Mobile [phone]- Toll Free Main Office

## 2016-12-04 ENCOUNTER — Ambulatory Visit (HOSPITAL_COMMUNITY): Payer: Self-pay | Admitting: Clinical

## 2016-12-10 ENCOUNTER — Ambulatory Visit (INDEPENDENT_AMBULATORY_CARE_PROVIDER_SITE_OTHER): Payer: Medicare Other | Admitting: Family Medicine

## 2016-12-10 ENCOUNTER — Encounter: Payer: Self-pay | Admitting: Family Medicine

## 2016-12-10 VITALS — BP 128/70 | HR 68 | Temp 97.9°F | Ht 72.0 in | Wt 213.2 lb

## 2016-12-10 DIAGNOSIS — F2089 Other schizophrenia: Secondary | ICD-10-CM | POA: Diagnosis not present

## 2016-12-10 DIAGNOSIS — Z136 Encounter for screening for cardiovascular disorders: Secondary | ICD-10-CM | POA: Diagnosis not present

## 2016-12-10 DIAGNOSIS — Z0000 Encounter for general adult medical examination without abnormal findings: Secondary | ICD-10-CM

## 2016-12-10 DIAGNOSIS — F3111 Bipolar disorder, current episode manic without psychotic features, mild: Secondary | ICD-10-CM

## 2016-12-10 NOTE — Progress Notes (Addendum)
Subjective:   [name] is a 66 y.o. male who presents for an Initial Medicare Annual Wellness Visit.  Review of Systems  No ROS.  Medicare Wellness Visit.  Cardiac Risk Factors include: advanced age (>36men, >31 women);diabetes mellitus;male gender  Sleep patterns: has restless sleep, has frequent nighttime awakenings, has daytime sleepiness and gets up several times nightly to void. Goes to bed at same time every night. Takes 1 hour daytime nap as needed. Reports Xanax does not help w/ sleep.  Home Safety/Smoke Alarms: Does not feel safe in home due to mold. Home is a rental property. He has reported this to landlord and is awaiting action. Smoke detectors and carbon monoxide detectors in home.  Living environment; residence and Firearm Safety: Lives w/ wife and 2 dogs. 1-story house/ trailer, firearms stored safely. Seat Belt Safety/Bike Helmet: Does not always wear seat belt.    Counseling:   Eye Exam- Follows w/ eye doctor yearly, cannot remember name of provider. Dental- "I would rather have open heart surgery without anesthesia than go to the dentist." Partial plates upper. Reports good dental hygiene practices.   Male:   CCS- last 05/03/15. 3 year recall.     PSA- Not on file. Follows w/ Dr. Jeffie Pollock, [facility].    Objective:    Today's Vitals   12/10/16 1035  BP: 128/70  Pulse: 68  Temp: 97.9 F (36.6 C)  TempSrc: Oral  SpO2: 99%  Weight: 213 lb 3.2 oz (96.7 kg)  Height: 6' (1.829 m)   Body mass index is 28.92 kg/m.  Current Medications (verified) Outpatient Encounter Prescriptions as of 12/10/2016  Medication Sig  . acetaminophen (TYLENOL) 500 MG tablet Take 500 mg by mouth every 6 (six) hours as needed for headache (pain).  Marland Kitchen albuterol (PROVENTIL HFA;VENTOLIN HFA) 108 (90 Base) MCG/ACT inhaler Inhale 2 puffs into the lungs every 6 (six) hours as needed for wheezing or shortness of breath.  Marland Kitchen albuterol (PROVENTIL) (2.5 MG/3ML) 0.083% nebulizer solution  Take 2.5 mg by nebulization every 6 (six) hours as needed for wheezing or shortness of breath.  . baclofen (LIORESAL) 10 MG tablet Take 10 mg by mouth at bedtime.  Marland Kitchen EPINEPHrine 0.3 mg/0.3 mL IJ SOAJ injection Inject 0.3 mLs (0.3 mg total) into the muscle once. (Patient taking differently: Inject 0.3 mg into the muscle once as needed (severe allergic reactions). )  . loratadine (CLARITIN) 10 MG tablet Take 10 mg by mouth daily.  . nitroGLYCERIN (NITROSTAT) 0.4 MG SL tablet Place 1 tablet (0.4 mg total) under the tongue every 5 (five) minutes as needed for chest pain.  Marland Kitchen ondansetron (ZOFRAN) 4 MG tablet Take 1 tablet (4 mg total) by mouth every 12 (twelve) hours as needed for nausea or vomiting.  . OXYGEN Inhale 2 L into the lungs at bedtime. Reported on 11/25/2015  . predniSONE (DELTASONE) 10 MG tablet Take 1 tablet (10 mg total) by mouth daily with breakfast.  . sodium phosphate (FLEET) 7-19 GM/118ML ENEM Place 1 enema rectally as needed for severe constipation.  Marland Kitchen ALPRAZolam (XANAX) 1 MG tablet Take 0.5 tablets (0.5 mg total) by mouth 2 (two) times daily as needed for anxiety. (Patient taking differently: Take 1 mg by mouth 4 (four) times daily. )  . arformoterol (BROVANA) 15 MCG/2ML NEBU Take 2 mLs (15 mcg total) by nebulization 2 (two) times daily.  Marland Kitchen arformoterol (BROVANA) 15 MCG/2ML NEBU Take 2 mLs (15 mcg total) by nebulization 2 (two) times daily.  . ARIPiprazole (ABILIFY)  5 MG tablet Take 1 tablet (5 mg total) by mouth daily. (Patient not taking: Reported on 11/19/2016)  . atorvastatin (LIPITOR) 40 MG tablet One tab q hs (Patient not taking: Reported on 12/10/2016)  . bisoprolol (ZEBETA) 5 MG tablet Take 1 tablet (5 mg total) by mouth daily.  . Blood Glucose Monitoring Suppl (ACCU-CHEK AVIVA) device Use to check blood sugars every morning fasting, 2 hours after largest meal and as needed with low blood sugar symptoms (Patient not taking: Reported on 12/10/2016)  . budesonide (PULMICORT) 0.25  MG/2ML nebulizer solution Take 2 mLs (0.25 mg total) by nebulization 2 (two) times daily.  Marland Kitchen dexlansoprazole (DEXILANT) 60 MG capsule Take 1 capsule (60 mg total) by mouth daily. (Patient taking differently: Take 60 mg by mouth every other day. )  . fluticasone (FLONASE) 50 MCG/ACT nasal spray Place 2 sprays into both nostrils daily. (Patient not taking: Reported on 11/19/2016)  . HYDROcodone-acetaminophen (NORCO/VICODIN) 5-325 MG tablet Take 1 tablet by mouth every 6 (six) hours as needed for moderate pain.  Marland Kitchen losartan (COZAAR) 50 MG tablet Take 50 mg by mouth daily.  . meclizine (ANTIVERT) 25 MG tablet Take 25 mg by mouth 2 (two) times daily.  . [DISCONTINUED] albuterol (PROVENTIL) (2.5 MG/3ML) 0.083% nebulizer solution Take 3 mLs (2.5 mg total) by nebulization every 6 (six) hours as needed for wheezing or shortness of breath.  . [DISCONTINUED] predniSONE (DELTASONE) 10 MG tablet Please take 40 mg oral daily for 3 days, then 30 mg oral daily for 3 days, then 20 mg oral daily for 3 days, then 10 mg oral daily until seen by pulmonary.   No facility-administered encounter medications on file as of 12/10/2016.     Allergies (verified) Aspirin; Hornet venom; Ivp dye [iodinated diagnostic agents]; Levaquin [levofloxacin in d5w]; Nsaids; Penicillins; Tolmetin; Perforomist [formoterol]; Buprenorphine hcl; Morphine and related; and Oxycodone   History: Past Medical History:  Diagnosis Date  . Anxiety disorder   . Arthritis    BILATERAL SHOULDERS, ELBOWS AND HANDS AND LEFT HIP AND KNEES--HX CORTISONE SHOTS IN SHOULDERS, ELBOWS, HIP AND KNEES  . Basal cell carcinoma of back 04/30/2016   Dermatologist- Dr Nevada Crane;   MOHS sx- Dr Levada Dy   . Bladder outlet obstruction   . Chronic idiopathic constipation 05/08/2015  . Complication of anesthesia    DIFFICULT WAKING   . COPD (chronic obstructive pulmonary disease) [place] C Frequent exacerbations 01/13/2014   [name] 07/08/14: FeV1 51% FeV1/FVC 66% FVC 59% 10/5/2015ONO  RA was normal  10/13/2014  ONO on RA NORMAL   . COPD, frequent exacerbations ([place])    pulmologist-  dr Joya Gaskins--  [place] Rm Stage C.04-25-15 recent COPD exacerbation-much improved now, after tx. in ER [facility].  Marland Kitchen Depression   . Diabetes mellitus without complication ([place])    BODERLINE - DIET CONTROL  . Dysrhythmia    PVC'S  . Family history of adverse reaction to anesthesia    father would wake up with agitation   . Former smoker 01/13/2014  . Gastroesophageal reflux disease without esophagitis   . Heavy alcohol use 04/30/2016  . History of chronic bronchitis   . History of oxygen administration    oxygen use 2 l/m nasally at bedtime and exertional occasions  . History of rheumatic fever   . History of TB (tuberculosis)    1984--  hospitalized for 4 month treatment  . History of urinary retention   . Hx of multiple concussions    x 2 per patient   . Hypertension   .  Melanoma ([place])   . Nocturnal oxygen desaturation    USES O2 NIGHTLY  . OSA (obstructive sleep apnea) 01/13/2014  . PONV (postoperative nausea and vomiting)   . Post-polio syndrome    polio at age 15--PT WAS IN IRON LUNG; PT WAS IN W/C UNTIL AGE 57; STILL HAS WEAKNESS RIGHT SIDE  . Prostate cancer ([place])   . Schizophrenia ([place])   . Tuberculosis    Hosp 4 months rx , left early    Past Surgical History:  Procedure Laterality Date  . CARDIOVASCULAR STRESS TEST  06-08-2014  dr Mare Ferrari   normal lexiscan study/  no ischemia/  not gated due to PAC's  . COLONOSCOPY N/A 05/03/2015   Procedure: COLONOSCOPY;  Surgeon: [name], MD;  Location: [facility];  Service: Endoscopy;  Laterality: N/A;  . CYSTOSCOPY N/A 10/25/2015   Procedure: CYSTOSCOPY;  Surgeon: [name], MD;  Location: [facility];  Service: Urology;  Laterality: N/A;  . CYSTOSCOPY W/ CYSTOGRAM/  TRANSRECTAL ULTRASOUND PROSTATE BX  03-22-2009  . ESOPHAGOGASTRODUODENOSCOPY N/A 03/22/2015   Procedure: ESOPHAGOGASTRODUODENOSCOPY (EGD) with dilation;  Surgeon: [name], MD;  Location: [facility];  Service: Endoscopy;  Laterality: N/A;  . excision of skin lesion    . LAPAROSCOPIC CHOLECYSTECTOMY  2013  . left elbow surgery      due to fracture   . NASAL SEPTUM SURGERY  2000  . OTHER SURGICAL HISTORY      Muscle & bone Graft/Polio  . polio surgeries      14 polio surgeries   . PROSTATE BIOPSY N/A 09/28/2014   Procedure: PROSTATE ULTRASOUND/BIOPSY;  Surgeon: [name], MD;  Location: [facility];  Service: Urology;  Laterality: N/A;  . PROSTATE BIOPSY N/A 10/25/2015   Procedure: PROSTATE BIOPSY AND ULTRASOUND;  Surgeon: [name], MD;  Location: [facility];  Service: Urology;  Laterality: N/A;  . SAVORY DILATION N/A 03/22/2015   Procedure: SAVORY DILATION;  Surgeon: [name], MD;  Location: [facility];  Service: Endoscopy;  Laterality: N/A;  . SHOULDER ARTHROSCOPY WITH OPEN ROTATOR CUFF REPAIR Bilateral 2013  &  1999   removal spurs and bursectomy  . TRANSURETHRAL INCISION OF BLADDER NECK N/A 10/25/2015   Procedure:  TRANSURETHRAL INCISION OF BLADDER NECK;  Surgeon: [name], MD;  Location: [facility];  Service: Urology;  Laterality: N/A;  . TRANSURETHRAL RESECTION OF PROSTATE N/A 09/28/2014   Procedure: TRANSURETHRAL RESECTION OF THE PROSTATE (TURP);  Surgeon: [name], MD;  Location: [facility];  Service: Urology;  Laterality: N/A;  . URETEROSOPY STONE EXTRACTION  2000   Family History  Problem Relation Age of Onset  . Alzheimer's disease Father 4    Deceased  . Stomach cancer Father   . Heart attack Father   . Heart disease Father   . Skin cancer Mother     Facial-Living  . Cancer Mother     skin  . Alcohol abuse Sister     x2  . Mental illness Sister     x2  . Alcohol abuse Sister   . Diabetes Maternal Aunt     x2  . Thyroid disease Maternal Aunt     x4  . Diabetes Maternal Uncle   . Tuberculosis Paternal Grandfather   . Tuberculosis Paternal Grandmother   . Alzheimer's disease Paternal Aunt   . Alzheimer's disease Paternal Uncle    . Colon cancer Neg Hx   . Colon polyps Neg Hx   . Crohn's disease Neg Hx   .  Ulcerative colitis Neg Hx    Social History   Occupational History  . Retired    Social History Main Topics  . Smoking status: Former Smoker    Years: 47.00    Quit date: 08/25/2013  . Smokeless tobacco: Former Systems developer     Comment: 5-6 times smoking a pipe  . Alcohol use 8.4 oz/week    14 Cans of beer per week     Comment: 2 cans daily  . Drug use: No     Comment: hx of marijuana use   . Sexual activity: Yes    Birth control/ protection: None   Tobacco Counseling Counseling given: No   Activities of Daily Living In your present state of health, do you have any difficulty performing the following activities: 12/10/2016 11/16/2016  Hearing? N N  Vision? N N  Difficulty concentrating or making decisions? Tempie Donning  Walking or climbing stairs? N Y  Dressing or bathing? N N  Doing errands, shopping? N N  Preparing Food and eating ? N N  Using the Toilet? N N  In the past six months, have you accidently leaked urine? Y N  Do you have problems with loss of bowel control? N N  Managing your Medications? N N  Managing your Finances? N N  Housekeeping or managing your Housekeeping? N N  Some recent data might be hidden    Immunizations and Health Maintenance Immunization History  Administered Date(s) Administered  . Influenza Split 07/15/2013  . Influenza, High Dose Seasonal PF 07/19/2016  . Influenza,inj,Quad PF,36+ Mos 07/14/2014, 09/06/2015  . Influenza-Unspecified 03/14/2015  . Pneumococcal Conjugate-13 08/13/2014  . Pneumococcal Polysaccharide-23 08/15/2010, 06/21/2015   Health Maintenance Due  Topic Date Due  . FOOT EXAM  11/13/1960  . HEMOGLOBIN A1C  10/31/2016    Patient Care Team: Shelda Pal, DO as PCP - General (Family Medicine) Tiajuana Amass, MD as Consulting Physician (Allergy and Immunology) Melvenia Needles, NP as Nurse Practitioner (Pulmonary Disease) Rigoberto Noel, MD as  Consulting Physician (Pulmonary Disease) Domingo Pulse, MD as Consulting Physician (Urology) Allyn Kenner, MD as Consulting Physician (Dermatology) Alfonzo Feller, RN as [place] as [place] Management (Pharmacist) [name], MD as Consulting Physician (Urology) Lelon Perla, MD as Consulting Physician (Cardiology) [name], MD as Consulting Physician (Gastroenterology)  Indicate any recent Medical Services you may have received from other than [facility] providers in the past year (date may be approximate).    Assessment:   This is a routine wellness examination for Jaydan. Physical assessment deferred to PCP.  Hearing/Vision screen Hearing Screening Comments: Able to hear conversational tones w/o difficulty. Pt reports some chronic ringing in ears. Passes whisper test. Vision Screening Comments: Bilateral cataracts. Follows w/ eye doctor yearly. Wearing glasses.  Dietary issues and exercise activities discussed: Exercise limited by: respiratory conditions(s) (COPD)  Diet (meal preparation, eat out, water intake, caffeinated beverages, dairy products, fruits and vegetables): in general, an "unhealthy" diet. Able to prepare own meals. Reports decreased appetite. Reports frequently will not eat anything or does not remember what he ate. Does not eat fruit, rarely eats vegetables. Drinks lots of milk and drinks water w/ medications.  24 Hour Recall Breakfast: bacon and glass of milk Lunch: skipped Dinner: roast w/ potatoes     Goals    . Increase physical activity      Depression Screen PHQ 2/9 Scores 11/16/2016 06/28/2016 12/12/2015 11/22/2014  PHQ -  2 Score 1 0 2 0  PHQ- 9 Score - - 9 -  Exception Documentation - - Medical reason -    Fall Risk Fall Risk  11/16/2016 06/28/2016 12/12/2015 11/22/2014  Falls in the past year? Yes Yes Yes Yes  Number falls in past yr: 1 2 or more 2 or more 1  Injury with Fall?  Yes Yes Yes Yes  Risk Factor Category  High Fall Risk - - High Fall Risk  Risk for fall due to : Impaired balance/gait;History of fall(s) Other (Comment) Impaired balance/gait History of fall(s)  Risk for fall due to (comments): - carelessness vs etoh 1 Ran into a branch,2. Backed off a porch -  Follow up Falls evaluation completed;Education provided Falls prevention discussed Falls prevention discussed -    Cognitive Function: MMSE - Mini Mental State Exam 12/10/2016  Orientation to time 4  Orientation to Place 5  Registration 3  Attention/ Calculation 5  Recall 2  Language- name 2 objects 2  Language- repeat 1  Language- follow 3 step command 3  Language- read & follow direction 1  Write a sentence 1  Copy design 1  Total score 28        Screening Tests Health Maintenance  Topic Date Due  . FOOT EXAM  11/13/1960  . HEMOGLOBIN A1C  10/31/2016  . OPHTHALMOLOGY EXAM  08/01/2017  . COLONOSCOPY  05/02/2018  . TETANUS/TDAP  08/15/2024  . INFLUENZA VACCINE  Completed  . Hepatitis C Screening  Completed  . PNA vac Low Risk Adult  Completed      Plan:   Follow-up w/ PCP as directed.  Bring a copy of your advance directives to your next office visit.  Continue doing brain stimulating activities (puzzles, reading, adult coloring books, staying active) to keep memory sharp.   During the course of the visit Lexie was educated and counseled about the following appropriate screening and preventive services:   Vaccines to include Pneumoccal, Influenza, Hepatitis B, Td, Zostavax, HCV  Colorectal cancer screening  Cardiovascular disease screening  Diabetes screening  Glaucoma screening  Nutrition counseling  Prostate cancer screening  Patient Instructions (the written plan) were given to the patient.   Dorrene German, RN   12/10/2016    Reviewed. Agree with above.  [place]man, DO 12/20/16 3:53 PM

## 2016-12-10 NOTE — Progress Notes (Signed)
Pre visit review using our clinic tool,if applicable. No additional management support is needed unless otherwise documented below in the visit note.  

## 2016-12-10 NOTE — Patient Instructions (Addendum)
Bring a copy of your advance directives to your next office visit. Consider keeping a journal by your bed to write down any concerns that wake you up during the night.   Psychiatric resources  [facility] [place], [place] [address], [place] [address] [phone]  [facility] [place], [place] [address] [phone]  Dr. Sheralyn Boatman [address] [place], [place] [address] [phone]

## 2016-12-10 NOTE — Progress Notes (Signed)
Chief Complaint  Patient presents with  . Medicare Wellness    Needs referral for Behavioral Med    [name] is here for a complete physical.   His last physical was >1 year ago.  Current diet: in general, a "healthy" diet   Current exercise: Active around house Weight trend: stable Does pt snore? No. Daytime fatigue? No. Seat belt? Intermittently    Health maintenance Colonoscopy- 04/2015   AAA screening- never  Past Medical History:  Diagnosis Date  . Anxiety disorder   . Arthritis    BILATERAL SHOULDERS, ELBOWS AND HANDS AND LEFT HIP AND KNEES--HX CORTISONE SHOTS IN SHOULDERS, ELBOWS, HIP AND KNEES  . Basal cell carcinoma of back 04/30/2016   Dermatologist- Dr Nevada Crane;   MOHS sx- Dr Levada Dy   . Bladder outlet obstruction   . Chronic idiopathic constipation 05/08/2015  . Complication of anesthesia    DIFFICULT WAKING   . COPD (chronic obstructive pulmonary disease) Gold C Frequent exacerbations 01/13/2014   [name] 07/08/14: FeV1 51% FeV1/FVC 66% FVC 59% 10/5/2015ONO RA was normal  10/13/2014  ONO on RA NORMAL   . COPD, frequent exacerbations ([place])    pulmologist-  dr Joya Gaskins--  [place] Rm Stage C.04-25-15 recent COPD exacerbation-much improved now, after tx. in ER [facility].  Marland Kitchen Depression   . Diabetes mellitus without complication ([place])    BODERLINE - DIET CONTROL  . Dysrhythmia    PVC'S  . Family history of adverse reaction to anesthesia    father would wake up with agitation   . Former smoker 01/13/2014  . Gastroesophageal reflux disease without esophagitis   . Heavy alcohol use 04/30/2016  . History of chronic bronchitis   . History of oxygen administration    oxygen use 2 l/m nasally at bedtime and exertional occasions  . History of rheumatic fever   . History of TB (tuberculosis)    1984--  hospitalized for 4 month treatment  . History of urinary retention   . Hx of multiple concussions    x 2 per patient   . Hypertension   . Melanoma ([place])   . Nocturnal  oxygen desaturation    USES O2 NIGHTLY  . OSA (obstructive sleep apnea) 01/13/2014  . PONV (postoperative nausea and vomiting)   . Post-polio syndrome    polio at age 22--PT WAS IN IRON LUNG; PT WAS IN W/C UNTIL AGE 74; STILL HAS WEAKNESS RIGHT SIDE  . Prostate cancer ([place])   . Schizophrenia ([place])   . Tuberculosis    Hosp 4 months rx , left early     Past Surgical History:  Procedure Laterality Date  . CARDIOVASCULAR STRESS TEST  06-08-2014  dr Mare Ferrari   normal lexiscan study/  no ischemia/  not gated due to PAC's  . COLONOSCOPY N/A 05/03/2015   Procedure: COLONOSCOPY;  Surgeon: [name], MD;  Location: [facility];  Service: Endoscopy;  Laterality: N/A;  . CYSTOSCOPY N/A 10/25/2015   Procedure: CYSTOSCOPY;  Surgeon: [name], MD;  Location: [facility];  Service: Urology;  Laterality: N/A;  . CYSTOSCOPY W/ CYSTOGRAM/  TRANSRECTAL ULTRASOUND PROSTATE BX  03-22-2009  . ESOPHAGOGASTRODUODENOSCOPY N/A 03/22/2015   Procedure: ESOPHAGOGASTRODUODENOSCOPY (EGD) with dilation;  Surgeon: [name], MD;  [facility];  Service: Endoscopy;  Laterality: N/A;  . excision of skin lesion    . LAPAROSCOPIC CHOLECYSTECTOMY  2013  . left elbow surgery      due to fracture   . NASAL SEPTUM SURGERY  2000  . OTHER SURGICAL HISTORY      Muscle & bone Graft/Polio  . polio surgeries      14 polio surgeries   . PROSTATE BIOPSY N/A 09/28/2014   Procedure: PROSTATE ULTRASOUND/BIOPSY;  Surgeon: [name], MD;  [facility];  Service: Urology;  Laterality: N/A;  . PROSTATE BIOPSY N/A 10/25/2015   Procedure: PROSTATE BIOPSY AND ULTRASOUND;  Surgeon: [name], MD;  [facility]: [facility];  Service: Urology;  Laterality: N/A;  . SAVORY DILATION N/A 03/22/2015   Procedure: SAVORY DILATION;  Surgeon: [name], MD;  [facility];  Service: Endoscopy;  Laterality: N/A;  . SHOULDER ARTHROSCOPY WITH OPEN ROTATOR CUFF REPAIR Bilateral 2013  &  1999   removal spurs and bursectomy  .  TRANSURETHRAL INCISION OF BLADDER NECK N/A 10/25/2015   Procedure:  TRANSURETHRAL INCISION OF BLADDER NECK;  Surgeon: [name], MD;  [facility];  Service: Urology;  Laterality: N/A;  . TRANSURETHRAL RESECTION OF PROSTATE N/A 09/28/2014   Procedure: TRANSURETHRAL RESECTION OF THE PROSTATE (TURP);  Surgeon: [name], MD;  [facility];  Service: Urology;  Laterality: N/A;  . URETEROSOPY STONE EXTRACTION  2000   Medications  Current Outpatient Prescriptions on File Prior to Visit  Medication Sig Dispense Refill  . acetaminophen (TYLENOL) 500 MG tablet Take 500 mg by mouth every 6 (six) hours as needed for headache (pain).    Marland Kitchen albuterol (PROVENTIL HFA;VENTOLIN HFA) 108 (90 Base) MCG/ACT inhaler Inhale 2 puffs into the lungs every 6 (six) hours as needed for wheezing or shortness of breath. 3 Inhaler 3  . albuterol (PROVENTIL) (2.5 MG/3ML) 0.083% nebulizer solution Take 2.5 mg by nebulization every 6 (six) hours as needed for wheezing or shortness of breath.    . baclofen (LIORESAL) 10 MG tablet Take 10 mg by mouth at bedtime.    Marland Kitchen EPINEPHrine 0.3 mg/0.3 mL IJ SOAJ injection Inject 0.3 mLs (0.3 mg total) into the muscle once. (Patient taking differently: Inject 0.3 mg into the muscle once as needed (severe allergic reactions). ) 1 Device 1  . loratadine (CLARITIN) 10 MG tablet Take 10 mg by mouth daily.    . nitroGLYCERIN (NITROSTAT) 0.4 MG SL tablet Place 1 tablet (0.4 mg total) under the tongue every 5 (five) minutes as needed for chest pain. 30 tablet 3  . ondansetron (ZOFRAN) 4 MG tablet Take 1 tablet (4 mg total) by mouth every 12 (twelve) hours as needed for nausea or vomiting. 30 tablet 2  . OXYGEN Inhale 2 L into the lungs at bedtime. Reported on 11/25/2015    . predniSONE (DELTASONE) 10 MG tablet Take 1 tablet (10 mg total) by mouth daily with breakfast. 30 tablet 0  . sodium phosphate (FLEET) 7-19 GM/118ML ENEM Place 1 enema rectally as needed for severe constipation.    Marland Kitchen  ALPRAZolam (XANAX) 1 MG tablet Take 0.5 tablets (0.5 mg total) by mouth 2 (two) times daily as needed for anxiety. (Patient taking differently: Take 1 mg by mouth 4 (four) times daily. ) 60 tablet 0  . arformoterol (BROVANA) 15 MCG/2ML NEBU Take 2 mLs (15 mcg total) by nebulization 2 (two) times daily. 120 mL 6  . arformoterol (BROVANA) 15 MCG/2ML NEBU Take 2 mLs (15 mcg total) by nebulization 2 (two) times daily. 1 mL 0  . ARIPiprazole (ABILIFY) 5 MG tablet Take 1 tablet (5 mg total) by mouth daily. (Patient not taking: Reported on 11/19/2016) 30 tablet 1  . atorvastatin (LIPITOR)  40 MG tablet One tab q hs (Patient not taking: Reported on 12/10/2016) 90 tablet 1  . bisoprolol (ZEBETA) 5 MG tablet Take 1 tablet (5 mg total) by mouth daily. 30 tablet 0  . Blood Glucose Monitoring Suppl (ACCU-CHEK AVIVA) device Use to check blood sugars every morning fasting, 2 hours after largest meal and as needed with low blood sugar symptoms (Patient not taking: Reported on 12/10/2016) 1 each 0  . budesonide (PULMICORT) 0.25 MG/2ML nebulizer solution Take 2 mLs (0.25 mg total) by nebulization 2 (two) times daily. 120 mL 6  . dexlansoprazole (DEXILANT) 60 MG capsule Take 1 capsule (60 mg total) by mouth daily. (Patient taking differently: Take 60 mg by mouth every other day. ) 90 capsule 1  . fluticasone (FLONASE) 50 MCG/ACT nasal spray Place 2 sprays into both nostrils daily. (Patient not taking: Reported on 11/19/2016) 16 g 6  . HYDROcodone-acetaminophen (NORCO/VICODIN) 5-325 MG tablet Take 1 tablet by mouth every 6 (six) hours as needed for moderate pain.    Marland Kitchen losartan (COZAAR) 50 MG tablet Take 50 mg by mouth daily.    . meclizine (ANTIVERT) 25 MG tablet Take 25 mg by mouth 2 (two) times daily.     Allergies Allergies  Allergen Reactions  . Aspirin Anaphylaxis  . Hornet Venom Anaphylaxis  . Ivp Dye [Iodinated Diagnostic Agents] Anaphylaxis  . Levaquin [Levofloxacin In D5w] Shortness Of Breath and Swelling    In  addition: sweating, chest pain, and diarrhea.   . Nsaids Anaphylaxis  . Penicillins Anaphylaxis    Heart stops Has patient had a PCN reaction causing immediate rash, facial/tongue/throat swelling, SOB or lightheadedness with hypotension: yes Has patient had a PCN reaction causing severe rash involving mucus membranes or skin necrosis: yes Has patient had a PCN reaction that required hospitalization yes Has patient had a PCN reaction occurring within the last 10 years: No If all of the above answers are "NO", then may proceed with Cephalosporin use.   . Tolmetin Anaphylaxis  . Perforomist [Formoterol]     Increased wheezing, shortness of breath  . Buprenorphine Hcl Nausea And Vomiting    Can take with zofran   . Morphine And Related Nausea And Vomiting    Can take with zofran   . Oxycodone Itching and Rash   Family History Family History  Problem Relation Age of Onset  . Alzheimer's disease Father 4    Deceased  . Stomach cancer Father   . Heart attack Father   . Heart disease Father   . Skin cancer Mother     Facial-Living  . Cancer Mother     skin  . Alcohol abuse Sister     x2  . Mental illness Sister     x2  . Alcohol abuse Sister   . Diabetes Maternal Aunt     x2  . Thyroid disease Maternal Aunt     x4  . Diabetes Maternal Uncle   . Tuberculosis Paternal Grandfather   . Tuberculosis Paternal Grandmother   . Alzheimer's disease Paternal Aunt   . Alzheimer's disease Paternal Uncle   . Colon cancer Neg Hx   . Colon polyps Neg Hx   . Crohn's disease Neg Hx   . Ulcerative colitis Neg Hx     Review of Systems: Eye:  no recent significant change in vision- does have cataracts Integumentary (Skin/Breast):  no abnormal skin lesions reported  Exam BP 128/70   Pulse 68   Temp 97.9 F (36.6 C) (Oral)  Ht 6' (1.829 m)   Wt 213 lb 3.2 oz (96.7 kg)   SpO2 99%   BMI 28.92 kg/m  General:  well developed, well nourished, in no apparent distress Skin:  no  significant moles, warts, or growths Head:  no masses, lesions, or tenderness Eyes:  pupils equal and round, sclera anicteric without injection Ears:  canals without lesions, TMs shiny without retraction, no obvious effusion, no erythema Nose:  nares patent, septum midline, mucosa normal Throat/Pharynx:  lips and gingiva without lesion; tongue and uvula midline; non-inflamed pharynx; no exudates or postnasal drainage Neck: neck supple without adenopathy, thyromegaly, or masses Lungs:  clear to auscultation, breath sounds equal bilaterally, no respiratory distress Cardio:  regular rate and rhythm without murmurs, heart sounds without clicks or rubs Abdomen:  abdomen soft, nontender; bowel sounds normal; no masses or organomegaly Genital (male): circumcised penis, no lesions or discharge; testes present bilaterally without masses or tenderness Rectal: Deferred Musculoskeletal:  symmetrical muscle groups noted without atrophy or deformity Extremities:  no clubbing, cyanosis, or edema, no deformities, no skin discoloration Neuro:  gait normal; deep tendon reflexes normal and symmetric Psych: well oriented with normal range of affect and appropriate judgment/insight  Assessment and Plan  Well adult exam  Screening for AAA (abdominal aortic aneurysm) - Plan: US Aorta Initial Medicare Screen  Other schizophrenia ([place])  Bipolar affective disorder, currently manic, mild ([place]) - Plan: Ambulatory referral to Psychology  Encounter for Medicare annual wellness exam   Well 66 y.o. male. Counseled on diet and exercise. Discussed setting up with psychiatry in addition to seeing a counselor. Outpatient resources provided. AMW today as well. That visit was reviewed. I agree with its documentation. Follow up prn. The patient voiced understanding and agreement to the plan.  Greater than 25 minutes were spent face to face with the patient with greater than 50% of this time spent counseling on AAA  screening, diet/exercise, and behavioral health.    [place], DO 12/10/16 1:25 PM

## 2016-12-11 ENCOUNTER — Other Ambulatory Visit: Payer: Self-pay | Admitting: *Deleted

## 2016-12-11 NOTE — Patient Outreach (Signed)
[facility]) Care Management  12/11/2016  [name] 13-Jun-1951 [id number]   Transition of care   Spoke with patient discussed his breathing has been having more good days with his breathing , he continues to take medications and respiratory treatments as recommended and using oxygen at night. Patient discussed he is still able to be active outdoors, get tired then rest and takes breathing treatments albuterol if wheezing develops . Patient discussed he plans to attend pulmonary orientation visit on this week and hopes to start program in the next week. Patient discussed his recent PCP visit and has been provided resources for finding Psychiatrist , reports he has rescheduled his visit with counselor at behavioral health.    Patient has completed 31 days of transition of care program ,and will continued to be followed next 30 days for complex care management.   Plan Will follow up by telephone in the next 2 weeks Patient will make contact regarding psychiatry appointment Patient will continue to notify MD of worsening COPD symptoms, and seek medical and ED for red zone symptoms .   Joylene Draft, RN, [place] Management [phone]- Mobile [phone]- Toll Free Main Office

## 2016-12-12 ENCOUNTER — Ambulatory Visit (HOSPITAL_BASED_OUTPATIENT_CLINIC_OR_DEPARTMENT_OTHER): Payer: Medicare Other

## 2016-12-18 ENCOUNTER — Ambulatory Visit: Payer: Self-pay

## 2016-12-19 ENCOUNTER — Ambulatory Visit: Payer: Self-pay | Admitting: Pulmonary Disease

## 2016-12-21 DIAGNOSIS — L820 Inflamed seborrheic keratosis: Secondary | ICD-10-CM | POA: Diagnosis not present

## 2016-12-21 DIAGNOSIS — Z08 Encounter for follow-up examination after completed treatment for malignant neoplasm: Secondary | ICD-10-CM | POA: Diagnosis not present

## 2016-12-21 DIAGNOSIS — Z85828 Personal history of other malignant neoplasm of skin: Secondary | ICD-10-CM | POA: Diagnosis not present

## 2016-12-24 ENCOUNTER — Ambulatory Visit (HOSPITAL_COMMUNITY): Payer: Self-pay | Admitting: Clinical

## 2016-12-24 ENCOUNTER — Telehealth: Payer: Self-pay | Admitting: Pulmonary Disease

## 2016-12-24 NOTE — Telephone Encounter (Signed)
Called by [name] d/t increased SOB and wheezing. He has not taken a temperature. He reports an increase in the production of brown respiratory secretions in the AM. He breathing has not really improved in several days. Clinical picture is c/w AECOPD and +/- bronchitis. Will call in prescriptions for: 1. Doxycycline 100 mg, Disp # 20, Sig: 1 tablet PO BID for 10 days and 2. Prednisone 10 mg, Disp # 20, Sig: take 4 tablets daily for 2 days, then 3 tablets daily for 2 days, then 2 tablets daily for 2 days and then 1 tablet daily for 2 days. He was instructed to call the PCCM office in the AM for a follow up office appoint. In the event that his breathing became significantly worse, he should go to the Emergency Department for evaluation an possible hospital admission.

## 2016-12-25 ENCOUNTER — Telehealth: Payer: Self-pay | Admitting: Pulmonary Disease

## 2016-12-25 ENCOUNTER — Telehealth: Payer: Self-pay | Admitting: Family Medicine

## 2016-12-25 ENCOUNTER — Other Ambulatory Visit: Payer: Self-pay | Admitting: *Deleted

## 2016-12-25 NOTE — Patient Outreach (Signed)
[place] [facility]) Care Management  12/25/2016  [name] Dec 21, 1950 [id number]   Telephone follow up call  Scheduled telephone call to patient to follow up , no answer able to leave a hipaa compliant message requesting a return call.  Plan Will await return call if no response will attempt contact in the next week.   Joylene Draft, RN, [place] Management [phone]- Mobile [phone]- Toll Free Main Office

## 2016-12-25 NOTE — Telephone Encounter (Signed)
Spoke with pt. There was another encounter on this pt but it was closed. Pt needed to be scheduled for an appointment for SOB. He refused appointment today at 4:30pm with SG. Pt has been scheduled for 01/07/17 at 11am with TP. Nothing further was needed.

## 2016-12-25 NOTE — Telephone Encounter (Signed)
error:[id number] ° °

## 2016-12-25 NOTE — Telephone Encounter (Signed)
Please check with him in a.m. for appointment

## 2016-12-25 NOTE — Telephone Encounter (Signed)
LMOMTCB x 1 

## 2016-12-26 ENCOUNTER — Ambulatory Visit (HOSPITAL_COMMUNITY): Payer: Self-pay | Admitting: Psychiatry

## 2016-12-31 ENCOUNTER — Telehealth: Payer: Self-pay | Admitting: Family Medicine

## 2016-12-31 ENCOUNTER — Other Ambulatory Visit: Payer: Self-pay | Admitting: *Deleted

## 2016-12-31 DIAGNOSIS — E119 Type 2 diabetes mellitus without complications: Secondary | ICD-10-CM

## 2016-12-31 NOTE — Telephone Encounter (Signed)
Caller name: Relationship to patient: Self Can be reached: [phone]  Pharmacy:   [facility], [place]. [phone] (Phone) [phone] (Fax)     Reason for call: Refill bisoprolol (ZEBETA) 5 MG tablet [[id number]

## 2016-12-31 NOTE — Patient Outreach (Signed)
[place] [facility]) Care Management  12/31/2016  [name] 19-Apr-1951 [id number]  Incoming call from [name], patient upset stating that he is out of bisoprolol only has one dose remaining for tomorrow am, and the bottle has no refills listed on label. Patient states he has called his pharmacy and they have attempted to send fax to MD for renewal.  Patient reports he has called MD office and spoke team health nurse and they state he has to come in for a visit before prescription can be renewed. Patient upset stating he has seen the doctor recently once after discharge from hospital in January and again in February for wellness visit, patient does not understand why he needs to be seen for renewal of prescription.     Plan Will place call to [facility] for clarification, spoke with pharmacist that verifies prescription does not have a refill on it and they will fax renewal request to MD office.  Placed call to [place] office regarding patient request for renewal on refill of bisoprolol (Zebeta), and discuss patient being upset regarding process, able to speak with Langley Gauss and she will send a message to [place]. Will follow up with patient in the next day.   Joylene Draft, RN, [place] Management [phone]- Mobile [phone]- Toll Free Main Office

## 2017-01-01 ENCOUNTER — Other Ambulatory Visit: Payer: Self-pay | Admitting: *Deleted

## 2017-01-01 MED ORDER — BISOPROLOL FUMARATE 5 MG PO TABS: 5.0000 mg | ORAL_TABLET | Freq: Every day | ORAL | 1 refills | Status: DC

## 2017-01-01 NOTE — Patient Outreach (Signed)
[place] [facility]) Care Management  01/01/2017  [name] [birth year] [id number]   Care Coordination call  Placed call to [place] office to follow up on patient renewal of bisoprolol being faxed to [place]. I was able to speak with Meredith Mody that forwarded message to Angie and renewal to be sent to pharmacy.  Placed call to Mr.Dusseau regarding prescription renewal being sent to pharmacy. No answer by phone, able to leave a hipaa compliant message requesting a return call.   Plan Will plan return call in the next business day.    Joylene Draft, RN, [place] Management [phone]- Mobile [phone]- Toll Free Main Office

## 2017-01-01 NOTE — Telephone Encounter (Signed)
Rx sent to the pharmacy by e-script.//AB/CMA 

## 2017-01-02 ENCOUNTER — Other Ambulatory Visit: Payer: Self-pay | Admitting: *Deleted

## 2017-01-02 NOTE — Patient Outreach (Signed)
[place] [facility]) Care Management  01/02/2017  [name] 08-May-1951 [id number]   Telephone follow up call  Spoke with patient to inform him  that PCP office has called in renewal for his bisoprolol to [place]. Patient states he will check with pharmacy.  Plan Will plan follow up call to patient in next week.  Joylene Draft, RN, [place] Management [phone]- Mobile [phone]- Toll Free Main Office

## 2017-01-04 ENCOUNTER — Encounter (HOSPITAL_BASED_OUTPATIENT_CLINIC_OR_DEPARTMENT_OTHER): Payer: Self-pay | Admitting: Emergency Medicine

## 2017-01-04 ENCOUNTER — Emergency Department (HOSPITAL_BASED_OUTPATIENT_CLINIC_OR_DEPARTMENT_OTHER)
Admission: EM | Admit: 2017-01-04 | Discharge: 2017-01-04 | Disposition: A | Discharge status: Home / Self Care | Payer: Medicare Other | Attending: Emergency Medicine | Admitting: Emergency Medicine

## 2017-01-04 ENCOUNTER — Emergency Department (HOSPITAL_BASED_OUTPATIENT_CLINIC_OR_DEPARTMENT_OTHER): Payer: Medicare Other

## 2017-01-04 DIAGNOSIS — Z79899 Other long term (current) drug therapy: Secondary | ICD-10-CM | POA: Insufficient documentation

## 2017-01-04 DIAGNOSIS — J449 Chronic obstructive pulmonary disease, unspecified: Secondary | ICD-10-CM | POA: Insufficient documentation

## 2017-01-04 DIAGNOSIS — Z87891 Personal history of nicotine dependence: Secondary | ICD-10-CM | POA: Insufficient documentation

## 2017-01-04 DIAGNOSIS — R0602 Shortness of breath: Secondary | ICD-10-CM | POA: Insufficient documentation

## 2017-01-04 DIAGNOSIS — I10 Essential (primary) hypertension: Secondary | ICD-10-CM | POA: Insufficient documentation

## 2017-01-04 DIAGNOSIS — Z0389 Encounter for observation for other suspected diseases and conditions ruled out: Secondary | ICD-10-CM | POA: Diagnosis not present

## 2017-01-04 DIAGNOSIS — E119 Type 2 diabetes mellitus without complications: Secondary | ICD-10-CM | POA: Diagnosis not present

## 2017-01-04 DIAGNOSIS — R1011 Right upper quadrant pain: Secondary | ICD-10-CM

## 2017-01-04 DIAGNOSIS — R42 Dizziness and giddiness: Secondary | ICD-10-CM | POA: Diagnosis not present

## 2017-01-04 DIAGNOSIS — R109 Unspecified abdominal pain: Secondary | ICD-10-CM | POA: Diagnosis not present

## 2017-01-04 LAB — CBC WITH DIFFERENTIAL/PLATELET
BASOS ABS: 0 10*3/uL (ref 0.0–0.1)
Basophils Relative: 1 %
Eosinophils Absolute: 0.4 10*3/uL (ref 0.0–0.7)
Eosinophils Relative: 5 %
HEMATOCRIT: 44.5 % (ref 39.0–52.0)
HEMOGLOBIN: 15.3 g/dL (ref 13.0–17.0)
LYMPHS PCT: 28 %
Lymphs Abs: 2 10*3/uL (ref 0.7–4.0)
MCH: 32 pg (ref 26.0–34.0)
MCHC: 34.4 g/dL (ref 30.0–36.0)
MCV: 93.1 fL (ref 78.0–100.0)
MONO ABS: 0.5 10*3/uL (ref 0.1–1.0)
Monocytes Relative: 7 %
NEUTROS ABS: 4.3 10*3/uL (ref 1.7–7.7)
NEUTROS PCT: 59 %
Platelets: 256 10*3/uL (ref 150–400)
RBC: 4.78 MIL/uL (ref 4.22–5.81)
RDW: 13.6 % (ref 11.5–15.5)
WBC: 7.3 10*3/uL (ref 4.0–10.5)

## 2017-01-04 LAB — COMPREHENSIVE METABOLIC PANEL
ALT: 34 U/L (ref 17–63)
AST: 31 U/L (ref 15–41)
Albumin: 4.3 g/dL (ref 3.5–5.0)
Alkaline Phosphatase: 50 U/L (ref 38–126)
Anion gap: 8 (ref 5–15)
BUN: 14 mg/dL (ref 6–20)
CHLORIDE: 101 mmol/L (ref 101–111)
CO2: 29 mmol/L (ref 22–32)
CREATININE: 1.27 mg/dL — AB (ref 0.61–1.24)
Calcium: 9.5 mg/dL (ref 8.9–10.3)
GFR calc non Af Amer: 57 mL/min — ABNORMAL LOW (ref >60)
Glucose, Bld: 130 mg/dL — ABNORMAL HIGH (ref 65–99)
POTASSIUM: 4.1 mmol/L (ref 3.5–5.1)
SODIUM: 138 mmol/L (ref 135–145)
Total Bilirubin: 0.6 mg/dL (ref 0.3–1.2)
Total Protein: 7.2 g/dL (ref 6.5–8.1)

## 2017-01-04 LAB — URINALYSIS, ROUTINE W REFLEX MICROSCOPIC
BILIRUBIN URINE: NEGATIVE
Glucose, UA: NEGATIVE mg/dL
Hgb urine dipstick: NEGATIVE
Ketones, ur: NEGATIVE mg/dL
Nitrite: NEGATIVE
PH: 5 (ref 5.0–8.0)
PROTEIN: NEGATIVE mg/dL
Specific Gravity, Urine: 1.021 (ref 1.005–1.030)

## 2017-01-04 LAB — URINALYSIS, MICROSCOPIC (REFLEX)

## 2017-01-04 LAB — LIPASE, BLOOD: LIPASE: 37 U/L (ref 11–51)

## 2017-01-04 LAB — ETHANOL: Alcohol, Ethyl (B): <5 mg/dL (ref <5)

## 2017-01-04 MED ORDER — IPRATROPIUM-ALBUTEROL 0.5-2.5 (3) MG/3ML IN SOLN
3.0000 mL | Freq: Once | RESPIRATORY_TRACT | Status: AC
  Administered 2017-01-04: 3 mL via RESPIRATORY_TRACT
  Filled 2017-01-04: qty 3

## 2017-01-04 MED ORDER — SODIUM CHLORIDE 0.9 % IV BOLUS (SEPSIS)
1000.0000 mL | Freq: Once | INTRAVENOUS | Status: AC
  Administered 2017-01-04: 1000 mL via INTRAVENOUS

## 2017-01-04 NOTE — Discharge Instructions (Signed)
Follow-up with your primary Dr. if not improving in the next week, and return to the ER symptoms significantly worsen or change.

## 2017-01-04 NOTE — ED Notes (Signed)
Pt not in acute distress, c/o abdominal pain that "makes him short of breath"

## 2017-01-04 NOTE — ED Provider Notes (Signed)
[facility] Provider Note  CSN: [id number] Arrival date & time: 01/04/17  2043 By signing my name below, I, Dyke Brackett, attest that this documentation has been prepared under the direction and in the presence of Veryl Speak, MD . Electronically Signed: Dyke Brackett, Scribe. 01/04/2017. 9:29 PM.   History   Chief Complaint Chief Complaint  Patient presents with  . Shortness of Breath   HPI [name] is a 66 y.o. male with a history of COPD, DM, and HTN who presents to the Emergency Department complaining of sudden onset, constant shortness of breath which began tonight PTA. Per pt, this began shortly after taking some hydrocodone. Pt states he has taken hydrocodone before and has never had a reaction to it in the past. He notes associated moderate lightheadedness, chest tightness and wheezing.   He also reports sudden onset, intermittent abdominal pain onset a few months ago which has progressively worsened in the last few days.  Pt states he has had "trouble with his liver" for a while due to alcohol use, and was told by his PCP 6 months ago that his liver enzymes were extremely high. Pt states he drinks 6 beers per day and has done this for 47 years. He began having aching, 8/10 right lateral abdominal pain tonight for which he took 2 hydrocodone. No alleviating factors noted. Pain is exacerbated by alcohol consumption. Pt quit smoking 5 years ago and denies any current tobacco use. He denies any chest pain and has no other complaints at this time.   The history is provided by the patient. No language interpreter was used.    Past Medical History:  Diagnosis Date  . Anxiety disorder   . Arthritis    BILATERAL SHOULDERS, ELBOWS AND HANDS AND LEFT HIP AND KNEES--HX CORTISONE SHOTS IN SHOULDERS, ELBOWS, HIP AND KNEES  . Basal cell carcinoma of back 04/30/2016   Dermatologist- Dr Nevada Crane;   MOHS sx- Dr Levada Dy   . Bladder outlet obstruction   . Chronic idiopathic  constipation 05/08/2015  . Complication of anesthesia    DIFFICULT WAKING   . COPD (chronic obstructive pulmonary disease) Gold C Frequent exacerbations 01/13/2014   [name] 07/08/14: FeV1 51% FeV1/FVC 66% FVC 59% 10/5/2015ONO RA was normal  10/13/2014  ONO on RA NORMAL   . COPD, frequent exacerbations ([place])    pulmologist-  dr Joya Gaskins--  [place] Rm Stage C.04-25-15 recent COPD exacerbation-much improved now, after tx. in ER [facility].  Marland Kitchen Depression   . Diabetes mellitus without complication ([place])    BODERLINE - DIET CONTROL  . Dysrhythmia    PVC'S  . Family history of adverse reaction to anesthesia    father would wake up with agitation   . Former smoker 01/13/2014  . Gastroesophageal reflux disease without esophagitis   . Heavy alcohol use 04/30/2016  . History of chronic bronchitis   . History of oxygen administration    oxygen use 2 l/m nasally at bedtime and exertional occasions  . History of rheumatic fever   . History of TB (tuberculosis)    1984--  hospitalized for 4 month treatment  . History of urinary retention   . Hx of multiple concussions    x 2 per patient   . Hypertension   . Melanoma ([place])   . Nocturnal oxygen desaturation    USES O2 NIGHTLY  . OSA (obstructive sleep apnea) 01/13/2014  . PONV (postoperative nausea and vomiting)   . Post-polio syndrome    polio at age  2--PT WAS IN IRON LUNG; PT WAS IN W/C UNTIL AGE 68; STILL HAS WEAKNESS RIGHT SIDE  . Prostate cancer ([place])   . Schizophrenia ([place])   . Tuberculosis    Hosp 4 months rx , left early     Patient Active Problem List   Diagnosis Date Noted  . COPD exacerbation ([place]) 11/04/2016  . Asthma exacerbation 11/03/2016  . Nausea without vomiting 09/17/2016  . Shortness of breath   . Diabetes mellitus type 2 in obese ([place]) 06/26/2016  . non-specific Chest pain 06/12/2016  . Patient's noncompliance with other medical treatment and regimen 06/02/2016  . Hyperlipidemia, mixed 06/02/2016  . Elevated liver enzymes   06/02/2016  . Schizophrenia ([place]) 04/30/2016  . Environmental and seasonal allergies 04/30/2016  . Diabetes mellitus without complication- (diet controlled; w/o proteinuria) 04/30/2016  . Heavy alcohol use- Many yrs  04/30/2016  . Basal cell carcinoma of back 04/30/2016  . Obesity 04/30/2016  . Hx of multiple concussions 04/30/2016  . h/o Vitamin D deficiency 04/30/2016  . Screen for sexually transmitted diseases 04/30/2016  . Immunocompromised state ([place]) 04/30/2016  . Cervical radiculopathy 01/15/2016  . Alcohol withdrawal ([place]) 11/22/2015  . h/o Suicidal thoughts 11/21/2015  . Dry mouth 10/07/2015  . Anxiety 09/19/2015  . OAB (overactive bladder) 07/27/2015  . Chronic idiopathic constipation 05/08/2015  . Screening for colon cancer   . Benign neoplasm of cecum   . Benign neoplasm of ascending colon   . Benign neoplasm of transverse colon   . Benign neoplasm of sigmoid colon   . Rectal polyp   . Gastroesophageal reflux disease without esophagitis   . Esophageal stricture   . Joint stiffness of multiple sites 10/17/2014  . Benign localized hyperplasia of prostate with urinary obstruction 09/29/2014  . concerns for memory loss 09/13/2014  . Tuberculosis   . Diverticulum of bladder 06/17/2014  . Neuropathy ([place]) 04/11/2014  . Post-polio syndrome 01/13/2014  . COPD (chronic obstructive pulmonary disease) ([place]) 01/13/2014  . Hypertension 01/13/2014  . Former smoker 01/13/2014  . h/o Prostate cancer 01/13/2014  . Bipolar disorder ([place]) 01/13/2014  . Explosive personality disorder 01/13/2014  . Erectile dysfunction 01/13/2014  . Nephrolithiasis 01/13/2014  . Chronic pain 01/13/2014  . Alcoholism ([place]) 01/13/2014  . OSA (obstructive sleep apnea) 01/13/2014   Past Surgical History:  Procedure Laterality Date  . CARDIOVASCULAR STRESS TEST  06-08-2014  dr Mare Ferrari   normal lexiscan study/  no ischemia/  not gated due to PAC's  . COLONOSCOPY N/A 05/03/2015   Procedure:  COLONOSCOPY;  Surgeon: [name], MD;  Location: [facility];  Service: Endoscopy;  Laterality: N/A;  . CYSTOSCOPY N/A 10/25/2015   Procedure: CYSTOSCOPY;  Surgeon: [name], MD;  Location: [facility];  Service: Urology;  Laterality: N/A;  . CYSTOSCOPY W/ CYSTOGRAM/  TRANSRECTAL ULTRASOUND PROSTATE BX  03-22-2009  . ESOPHAGOGASTRODUODENOSCOPY N/A 03/22/2015   Procedure: ESOPHAGOGASTRODUODENOSCOPY (EGD) with dilation;  Surgeon: [name], MD;  Location: [facility];  Service: Endoscopy;  Laterality: N/A;  . excision of skin lesion    . LAPAROSCOPIC CHOLECYSTECTOMY  2013  . left elbow surgery      due to fracture   . NASAL SEPTUM SURGERY  2000  . OTHER SURGICAL HISTORY      Muscle & bone Graft/Polio  . polio surgeries      14 polio surgeries   . PROSTATE BIOPSY N/A 09/28/2014   Procedure: PROSTATE ULTRASOUND/BIOPSY;  Surgeon: [name], MD;  Location: [facility];  Service: Urology;  Laterality: N/A;  . PROSTATE BIOPSY N/A 10/25/2015   Procedure: PROSTATE BIOPSY AND ULTRASOUND;  Surgeon: [name], MD;  Location: [facility];  Service: Urology;  Laterality: N/A;  . SAVORY DILATION N/A 03/22/2015   Procedure: SAVORY DILATION;  Surgeon: [name], MD;  Location: [facility];  Service: Endoscopy;  Laterality: N/A;  . SHOULDER ARTHROSCOPY WITH OPEN ROTATOR CUFF REPAIR Bilateral 2013  &  1999   removal spurs and bursectomy  . TRANSURETHRAL INCISION OF BLADDER NECK N/A 10/25/2015   Procedure:  TRANSURETHRAL INCISION OF BLADDER NECK;  Surgeon: [name], MD;  Location: [facility];  Service: Urology;  Laterality: N/A;  . TRANSURETHRAL RESECTION OF PROSTATE N/A 09/28/2014   Procedure: TRANSURETHRAL RESECTION OF THE PROSTATE (TURP);  Surgeon: [name], MD;  Location: [facility];  Service: Urology;  Laterality: N/A;  . URETEROSOPY STONE EXTRACTION  2000    Home Medications    Prior to Admission medications   Medication Sig Start Date End Date Taking? Authorizing Provider  acetaminophen (TYLENOL) 500  MG tablet Take 500 mg by mouth every 6 (six) hours as needed for headache (pain).    Historical Provider, MD  albuterol (PROVENTIL HFA;VENTOLIN HFA) 108 (90 Base) MCG/ACT inhaler Inhale 2 puffs into the lungs every 6 (six) hours as needed for wheezing or shortness of breath. 11/02/16   Rigoberto Noel, MD  albuterol (PROVENTIL) (2.5 MG/3ML) 0.083% nebulizer solution Take 2.5 mg by nebulization every 6 (six) hours as needed for wheezing or shortness of breath.    Historical Provider, MD  ALPRAZolam [name]) 1 MG tablet Take 0.5 tablets (0.5 mg total) by mouth 2 (two) times daily as needed for anxiety. Patient taking differently: Take 1 mg by mouth 4 (four) times daily.  10/29/16   Crosby Oyster Wendling, DO  arformoterol (BROVANA) 15 MCG/2ML NEBU Take 2 mLs (15 mcg total) by nebulization 2 (two) times daily. 10/24/16   Rigoberto Noel, MD  arformoterol (BROVANA) 15 MCG/2ML NEBU Take 2 mLs (15 mcg total) by nebulization 2 (two) times daily. 11/07/16   Rigoberto Noel, MD  ARIPiprazole (ABILIFY) 5 MG tablet Take 1 tablet (5 mg total) by mouth daily. Patient not taking: Reported on 11/19/2016 10/29/16   Shelda Pal, DO  atorvastatin (LIPITOR) 40 MG tablet One tab q hs Patient not taking: Reported on 12/10/2016 05/28/16   [name], DO  baclofen (LIORESAL) 10 MG tablet Take 10 mg by mouth at bedtime.    Historical Provider, MD  bisoprolol (ZEBETA) 5 MG tablet Take 1 tablet (5 mg total) by mouth daily. 01/01/17   Shelda Pal, DO  Blood Glucose Monitoring Suppl (ACCU-CHEK AVIVA) device Use to check blood sugars every morning fasting, 2 hours after largest meal and as needed with low blood sugar symptoms Patient not taking: Reported on 12/10/2016 06/12/16 06/12/17  Deborah Opalski, DO  budesonide (PULMICORT) 0.25 MG/2ML nebulizer solution Take 2 mLs (0.25 mg total) by nebulization 2 (two) times daily. 09/18/16   Tammy S Parrett, NP  dexlansoprazole (DEXILANT) 60 MG capsule Take 1 capsule (60 mg total)  by mouth daily. Patient taking differently: Take 60 mg by mouth every other day.  09/17/16   Betty G Martinique, MD  EPINEPHrine 0.3 mg/0.3 mL IJ SOAJ injection Inject 0.3 mLs (0.3 mg total) into the muscle once. Patient taking differently: Inject 0.3 mg into the muscle once as needed (severe allergic reactions).  02/24/16   Malvin Johns, MD  fluticasone (FLONASE) 50 MCG/ACT nasal spray Place  2 sprays into both nostrils daily. Patient not taking: Reported on 11/19/2016 08/03/15   Brunetta Jeans, PA-C  HYDROcodone-acetaminophen (NORCO/VICODIN) 5-325 MG tablet Take 1 tablet by mouth every 6 (six) hours as needed for moderate pain.    Historical Provider, MD  loratadine (CLARITIN) 10 MG tablet Take 10 mg by mouth daily.    Historical Provider, MD  losartan (COZAAR) 50 MG tablet Take 50 mg by mouth daily.    Historical Provider, MD  meclizine (ANTIVERT) 25 MG tablet Take 25 mg by mouth 2 (two) times daily.    Historical Provider, MD  nitroGLYCERIN (NITROSTAT) 0.4 MG SL tablet Place 1 tablet (0.4 mg total) under the tongue every 5 (five) minutes as needed for chest pain. 06/21/15   Brunetta Jeans, PA-C  ondansetron (ZOFRAN) 4 MG tablet Take 1 tablet (4 mg total) by mouth every 12 (twelve) hours as needed for nausea or vomiting. 09/17/16   Betty G Martinique, MD  OXYGEN Inhale 2 L into the lungs at bedtime. Reported on 11/25/2015    Historical Provider, MD  predniSONE (DELTASONE) 10 MG tablet Take 1 tablet (10 mg total) by mouth daily with breakfast. 11/14/16   Rigoberto Noel, MD  sodium phosphate (FLEET) 7-19 GM/118ML ENEM Place 1 enema rectally as needed for severe constipation.    Historical Provider, MD    Family History Family History  Problem Relation Age of Onset  . Alzheimer's disease Father 4    Deceased  . Stomach cancer Father   . Heart attack Father   . Heart disease Father   . Skin cancer Mother     Facial-Living  . Cancer Mother     skin  . Alcohol abuse Sister     x2  . Mental illness  Sister     x2  . Alcohol abuse Sister   . Diabetes Maternal Aunt     x2  . Thyroid disease Maternal Aunt     x4  . Diabetes Maternal Uncle   . Tuberculosis Paternal Grandfather   . Tuberculosis Paternal Grandmother   . Alzheimer's disease Paternal Aunt   . Alzheimer's disease Paternal Uncle   . Colon cancer Neg Hx   . Colon polyps Neg Hx   . Crohn's disease Neg Hx   . Ulcerative colitis Neg Hx     Social History Social History  Substance Use Topics  . Smoking status: Former Smoker    Years: 47.00    Quit date: 08/25/2013  . Smokeless tobacco: Former Systems developer     Comment: 5-6 times smoking a pipe  . Alcohol use 8.4 oz/week    14 Cans of beer per week     Comment: 2 cans daily     Allergies   Aspirin; Hornet venom; Ivp dye [iodinated diagnostic agents]; Levaquin [levofloxacin in d5w]; Nsaids; Penicillins; Tolmetin; Perforomist [formoterol]; Buprenorphine hcl; Morphine and related; and Oxycodone  Review of Systems Review of Systems  Respiratory: Positive for chest tightness, shortness of breath and wheezing.   Cardiovascular: Negative for chest pain.  Gastrointestinal: Positive for abdominal pain.  Neurological: Positive for light-headedness.  All other systems reviewed and are negative.  Physical Exam Updated Vital Signs BP (!) 141/72 (BP Location: Right Arm)   Pulse 79   Temp 98.3 F (36.8 C) (Oral)   Resp (!) 24   Ht 6' (1.829 m)   Wt 212 lb (96.2 kg)   SpO2 96%   BMI 28.75 kg/m   Physical Exam  Constitutional: He is  oriented to person, place, and time. He appears well-developed and well-nourished.  HENT:  Head: Normocephalic and atraumatic.  Eyes: EOM are normal.  Neck: Normal range of motion.  Cardiovascular: Normal rate, regular rhythm, normal heart sounds and intact distal pulses.   Pulmonary/Chest: Effort normal. No respiratory distress. He has wheezes.  Slight expiratory wheezing bilaterally   Abdominal: Soft. Bowel sounds are normal. He exhibits no  distension. There is tenderness.  TTP in the RUQ and right mid abdomen.   Musculoskeletal: Normal range of motion.  Neurological: He is alert and oriented to person, place, and time.  Skin: Skin is warm and dry.  Psychiatric: He has a normal mood and affect. Judgment normal.  Nursing note and vitals reviewed.  ED Treatments / Results  DIAGNOSTIC STUDIES:  Oxygen Saturation is 96% on RA, normal by my interpretation.    COORDINATION OF CARE:  9:10 PM Will order DG chest and CT abdomen. Discussed treatment plan with pt at bedside and pt agreed to plan.   Labs (all labs ordered are listed, but only abnormal results are displayed) Labs Reviewed - No data to display  EKG  EKG Interpretation None       Radiology No results found.  Procedures Procedures (including critical care time)  Medications Ordered in ED Medications - No data to display   Initial Impression / Assessment and Plan / ED Course  I have reviewed the triage vital signs and the nursing notes.  Pertinent labs & imaging results that were available during my care of the patient were reviewed by me and considered in my medical decision making (see chart for details).  Patient presents here with right-sided abdominal pain and wheezing. His workup reveals no acute abnormality on the CT scan and laboratory studies are reassuring. Urinalysis is clear. He is now feeling better. I am uncertain as to the exact etiology of this patient's symptoms, however nothing appears emergent. I see no indication for admission or further workup. He will be discharged, to return as needed for any problems.  Final Clinical Impressions(s) / ED Diagnoses   Final diagnoses:  None    New Prescriptions New Prescriptions   No medications on file  .I personally performed the services described in this documentation, which was scribed in my presence. The recorded information has been reviewed and is accurate.        Veryl Speak,  MD 01/04/17 2248

## 2017-01-04 NOTE — ED Triage Notes (Addendum)
Patient reports that he has a history of COPD - the patient states that he was having some pain in his liver tonight and took his pain pills for the pain. After he took the pain pills he started to become SOB. Patient states that he was SOB and dizzy yesterday almost passed out while shopping. He he has a noted irregular HR in triage. Denies any chest pain at this time

## 2017-01-04 NOTE — ED Notes (Signed)
Pt aware of need for urine sample. Pt on breathing treatment right now.

## 2017-01-07 ENCOUNTER — Ambulatory Visit: Payer: Self-pay | Admitting: Adult Health

## 2017-01-08 ENCOUNTER — Other Ambulatory Visit: Payer: Self-pay | Admitting: *Deleted

## 2017-01-08 NOTE — Patient Outreach (Signed)
[facility]) Care Management  01/08/2017  [name] 01/31/1951 [id number]   Telephone assessment   Placed call to patient no answer ,able to leave a hipaa compliant message requesting a return call.  Plan Will plan return call in the next week for follow up.    Joylene Draft, RN, [place] Management [phone]- Mobile [phone]- Toll Free Main Office

## 2017-01-14 ENCOUNTER — Other Ambulatory Visit: Payer: Self-pay | Admitting: *Deleted

## 2017-01-14 NOTE — Patient Outreach (Signed)
[facility]) Care Management  01/14/2017  [name] Jun 28, 1951 [id number]   Telephone assessment  Placed telephone call to patient , unsuccessful no answer, able to leave a hipaa compliant telephone message requesting a return call.  Plan Will await return call,if no response will  plan return call within the next week, if no response will send unsuccessful outreach letter per protocol.     Joylene Draft, RN, [place] Management [phone]- Mobile [phone]- Toll Free Main Office

## 2017-01-15 ENCOUNTER — Other Ambulatory Visit: Payer: Medicare Other

## 2017-01-15 ENCOUNTER — Encounter: Payer: Self-pay | Admitting: Pulmonary Disease

## 2017-01-15 ENCOUNTER — Ambulatory Visit (INDEPENDENT_AMBULATORY_CARE_PROVIDER_SITE_OTHER): Payer: Medicare Other | Admitting: Pulmonary Disease

## 2017-01-15 VITALS — BP 116/82 | HR 61 | Ht 72.0 in | Wt 214.0 lb

## 2017-01-15 DIAGNOSIS — J449 Chronic obstructive pulmonary disease, unspecified: Secondary | ICD-10-CM

## 2017-01-15 DIAGNOSIS — J309 Allergic rhinitis, unspecified: Secondary | ICD-10-CM | POA: Insufficient documentation

## 2017-01-15 DIAGNOSIS — J301 Allergic rhinitis due to pollen: Secondary | ICD-10-CM | POA: Diagnosis not present

## 2017-01-15 MED ORDER — PREDNISONE 10 MG PO TABS: 10.0000 mg | ORAL_TABLET | Freq: Every day | ORAL | 0 refills | Status: DC

## 2017-01-15 NOTE — Assessment & Plan Note (Signed)
Prednisone 10 mg tabs # 30 - take 40 mg daily for 2 days when you have worsening shortness of breath.   Stay on Pulmicort & Brovana  We once again outlined a plan for him in case of emergency  If you have breathing gets worse, do the following -Take albuterol nebulizer -Take an extra dose of prednisone -Call our office for an acute visit -If feeling really bad, then go to the emergency room

## 2017-01-15 NOTE — Patient Instructions (Signed)
Prednisone 10 mg tabs # 30 - take 40 mg daily for 2 days when you have worsening shortness of breath.  Allergy blood tests today  Stay on Pulmicort & Brovana  If you have breathing gets worse, do the following -Take albuterol nebulizer -Take an extra dose of prednisone -Call our office for an acute visit -If feeling really bad, then go to the emergency room

## 2017-01-15 NOTE — Progress Notes (Signed)
   Subjective:    Patient ID: [name], male    DOB: 12-12-1950, 66 y.o.   MRN: [id number]  HPI  66yo former smoker quit 2014 with COPD and mild OSA .  Post polio syndrome as a child  01/15/2017  Chief Complaint  Patient presents with  . Follow-up    Was in the hospital recently for SOB. Has been having problems for the past few weeks. Has been using inhalers but has not noticed a difference.    His breathing has been worse since November 2017 He had an acute office visit in 08/2016 and was treated with prednisone for a COPD flare-he was also hospitalized 11/03/16 for 3 days and discharged on prednisone taper  He did not tolerate breo, he was finally switched to Pulmicort and Perforomist which she has been unable to obtain from her DME. On his last visit he was started on prednisone 10 mg which she stayed on for about a month and then stopped. He called the office when he was acutely ill and was given doxycycline and prednisone He had an ER visit in 3/23 where he was given a nebulizer and then discharge  He continues working outside in the yard and has been able to travel recently to [place], but reports symptoms almost daily   He had a sleep study in 11/2015 and which showed mild OSA, he did not want CPAP therapy  Significant tests/ events  [place] 07/08/14: FeV1 51% FeV1/FVC 66% FVC 59% 10/13/2014 ONO on RA NORMAL  HST 11/2015 AHI 8/h  Echo 06/2016 nml LV fn 07/2016 negative myocardial perfusion study 08/2016 negative Holter for 30 days  Head CT 05/2016 BL max sinusitis   Review of Systems neg for any significant sore throat, dysphagia, itching, sneezing, nasal congestion or excess/ purulent secretions, fever, chills, sweats, unintended wt loss, pleuritic or exertional cp, hempoptysis, orthopnea pnd or change in chronic leg swelling. Also denies presyncope, palpitations, heartburn, abdominal pain, nausea, vomiting, diarrhea or change in bowel or urinary  habits, dysuria,hematuria, rash, arthralgias, visual complaints, headache, numbness weakness or ataxia.      Objective:   Physical Exam   Gen. Pleasant, obese, in no distress, normal affect ENT - no lesions, no post nasal drip, class 2-3 airway Neck: No JVD, no thyromegaly, no carotid bruits Lungs: no use of accessory muscles, no dullness to percussion, decreased without rales, faint scattered  rhonchi  Cardiovascular: Rhythm regular, heart sounds  normal, no murmurs or gallops, no peripheral edema Abdomen: soft and non-tender, no hepatosplenomegaly, BS normal. Musculoskeletal: No deformities, no cyanosis or clubbing Neuro:  alert, non focal, no tremors        Assessment & Plan:

## 2017-01-15 NOTE — Assessment & Plan Note (Signed)
Allergy blood tests today- RAST panel

## 2017-01-16 LAB — RESPIRATORY ALLERGY PROFILE REGION II ~~LOC~~
ALLERGEN, C. HERBARUM, M2: 0.16 kU/L — AB
ALLERGEN, D PTERNOYSSINUS, D1: 1.37 kU/L — AB
Allergen, A. alternata, m6: 0.39 kU/L — ABNORMAL HIGH
Allergen, Cedar tree, t12: 5.86 kU/L — ABNORMAL HIGH
Allergen, Comm Silver Birch, t9: 11.4 kU/L — ABNORMAL HIGH
Allergen, Cottonwood, t14: 6.55 kU/L — ABNORMAL HIGH
Allergen, Mouse Urine Protein, e78: 0.19 kU/L — ABNORMAL HIGH
Allergen, Mulberry, t76: 5.24 kU/L — ABNORMAL HIGH
Allergen, Oak,t7: 11 kU/L — ABNORMAL HIGH
Allergen, P. notatum, m1: 0.11 kU/L — ABNORMAL HIGH
Aspergillus fumigatus, m3: 0.19 kU/L — ABNORMAL HIGH
BERMUDA GRASS: 12.2 kU/L — AB
BOX ELDER: 7.46 kU/L — AB
COCKROACH: 4.51 kU/L — AB
Cat Dander: 2 kU/L — ABNORMAL HIGH
Common Ragweed: 9.5 kU/L — ABNORMAL HIGH
D. FARINAE: 1.59 kU/L — AB
Dog Dander: 23.7 kU/L — ABNORMAL HIGH
Elm IgE: 8.72 kU/L — ABNORMAL HIGH
IgE (Immunoglobulin E), Serum: 804 kU/L — ABNORMAL HIGH (ref <115)
JOHNSON GRASS: 12.8 kU/L — AB
Pecan/Hickory Tree IgE: 7.69 kU/L — ABNORMAL HIGH
Rough Pigweed  IgE: 7.04 kU/L — ABNORMAL HIGH
Sheep Sorrel IgE: 8.77 kU/L — ABNORMAL HIGH
Timothy Grass: 17.4 kU/L — ABNORMAL HIGH

## 2017-01-21 ENCOUNTER — Other Ambulatory Visit: Payer: Self-pay | Admitting: *Deleted

## 2017-01-21 ENCOUNTER — Encounter (HOSPITAL_COMMUNITY): Payer: Self-pay

## 2017-01-21 NOTE — Progress Notes (Signed)
Message left on department answer machine by Maudie Mercury with [facility] stating that she is working with patient and patient states he has been unable to establish appointment for pulmonary rehab. RN called patient and was able to reach him. Informed patient multiple attempts were made to contact patient but he never returned calls or responded to letter sent. Patient informed RN that his address had changed. Pulmonary rehab orientation appointment set for Monday 4/23 at 1:30. Will reactivate referral.

## 2017-01-21 NOTE — Patient Outreach (Addendum)
[facility]) Care Management  01/21/2017  [name] 1950/11/13 [id number]  Spoke with patient reports he is feeling fine this morning. Patient discussed her recent visit pulmonary doctor and plan for when he has increased shortness of breath, with prednisone dosing, albuterol treatment and continuing his Brovana and Pulmicort treatments. Patient discussed he want to stay on top of his breathing treatments as he doesn't want to the hospital again. Patient discussed  problem with his sinus and plan for ENT referral  [name] discussed he is interested in  pulmonary rehab but, states they have tried to contact him , but they have been able to connect on the phone to arrange  Appointment. Patient requested assistance with making contact.  Patient discussed he is still very active working outside and with chores at home, discussed he will be painting inside home on today, but he has a specialized mask that he wears. Reinforced importance of  adequate ventilation. Patient has completed 60 days of transition of care.  Patient agreeable for additional care management follow up to assist with coordination of pulmonary rehab and prevention of readmission.  Patient discussed he has made contact and  has appointment with therapist for mental health end of April.   Plan Will make care coordination call to Pulmonary rehab related to patient referral, able to leave a message for [facility] with patient best contact information and time he can be reached.  Patient will follow action plan for worsen of breathing symptoms  Patient collaboration for care planning and goal setting. Will follow up with patient in the next week.   Joylene Draft, RN, [place] Management [phone]- Mobile [phone]- Toll Free Main Office

## 2017-01-25 ENCOUNTER — Other Ambulatory Visit: Payer: Self-pay

## 2017-01-25 DIAGNOSIS — Z889 Allergy status to unspecified drugs, medicaments and biological substances status: Secondary | ICD-10-CM

## 2017-01-25 DIAGNOSIS — Z9189 Other specified personal risk factors, not elsewhere classified: Secondary | ICD-10-CM

## 2017-01-28 ENCOUNTER — Other Ambulatory Visit: Payer: Self-pay | Admitting: *Deleted

## 2017-01-28 NOTE — Patient Outreach (Signed)
[name]) Care Management  01/28/2017  [name] Mar 09, 1951 [id number]   Telephone follow up call  Placed scheduled call to patient, no answer on phone, able to leave a hipaa compliant message requesting a return call..  Plan Will await return call , If no response will return  call in the next week.  Jonathon Draft, RN, [place] Management [phone]- Mobile [phone]- Toll Free Main Office

## 2017-02-04 ENCOUNTER — Inpatient Hospital Stay (HOSPITAL_COMMUNITY)
Admission: RE | Admit: 2017-02-04 | Discharge: 2017-02-04 | Disposition: A | Discharge status: Home / Self Care | Payer: Self-pay | Source: Ambulatory Visit

## 2017-02-04 ENCOUNTER — Telehealth (HOSPITAL_COMMUNITY): Payer: Self-pay

## 2017-02-04 NOTE — Progress Notes (Signed)
Pulmonary Rehab progress note: After multiple attempts to contact patient regarding pulmonary rehab referral, patient finally reached and appointment for orientation scheduled for today at 1:30. Patient status is No Call/No Show. Will attempt to contact patient to inform him that he will not be rescheduled for orientation related to lack of contact and failure to cancel appointment.

## 2017-02-05 ENCOUNTER — Other Ambulatory Visit: Payer: Self-pay | Admitting: *Deleted

## 2017-02-05 NOTE — Patient Outreach (Signed)
[facility]) Care Management  02/05/2017  [name] Jul 18, 1951 [id number]  Telephone follow up call-   Unsuccessful call to patient , able to leave a hipaa compliant telephone message, requesting a return call.  2nd unsuccessful outreach call.   Plan   Will await return call if no response Will place return call in the next week, if no response will send outreach letter.   Joylene Draft, RN, [place] Management [phone]- Mobile [phone]- Toll Free Main Office

## 2017-02-06 ENCOUNTER — Telehealth: Payer: Self-pay | Admitting: Family Medicine

## 2017-02-06 ENCOUNTER — Ambulatory Visit: Payer: Self-pay | Admitting: Family Medicine

## 2017-02-06 NOTE — Telephone Encounter (Signed)
Pt lvm at 10:07 cancelling his appt. Pt says that something came up.   Should pt be charged?

## 2017-02-06 NOTE — Telephone Encounter (Signed)
No charge. 

## 2017-02-11 ENCOUNTER — Encounter (HOSPITAL_COMMUNITY)
Admission: RE | Admit: 2017-02-11 | Discharge: 2017-02-11 | Disposition: A | Discharge status: Home / Self Care | Payer: Medicare Other | Source: Ambulatory Visit | Attending: Pulmonary Disease | Admitting: Pulmonary Disease

## 2017-02-11 VITALS — BP 140/79 | HR 59 | Ht 72.0 in | Wt 216.3 lb

## 2017-02-11 DIAGNOSIS — J449 Chronic obstructive pulmonary disease, unspecified: Secondary | ICD-10-CM | POA: Insufficient documentation

## 2017-02-11 DIAGNOSIS — J42 Unspecified chronic bronchitis: Secondary | ICD-10-CM | POA: Diagnosis not present

## 2017-02-11 DIAGNOSIS — J439 Emphysema, unspecified: Secondary | ICD-10-CM

## 2017-02-11 NOTE — Progress Notes (Signed)
[name] 66 y.o. male Pulmonary Rehab Orientation Note Patient arrived today in Cardiac and Pulmonary Rehab for orientation to Pulmonary Rehab. He walked from [place] parking stopping to rest several times. He does not carry portable oxygen. Per pt, he uses 2 liters of oxygen at night when going to sleep. Color good, skin warm and dry. Patient is oriented to time and place. Patient's medical history, psychosocial health, and medications reviewed. Psychosocial assessment reveals pt lives with their spouse. Pt is currently retired. Pt hobbies include raising cows, fishing, and making wood crafts. Pt reports his stress level is high. Areas of stress/anxiety include his health and his family.  His mother is 51 and has made some bad decisions lately and has been in automobile accidents.  Pt does exhibit signs of depression. Signs of depression include irritability and sadness and difficulty falling asleep and difficulty maintaining sleep. PHQ2/9 score 6/9. Pt shows fair  coping skills with positive outlook . offered emotional support and reassurance. [name] has a long history of bipolar disorder and schizophrenia and presently is not seeing a Retail banker or taking medication.  He does not like the way he feels with medication.   Will continue to monitor and evaluate progress toward psychosocial goal(s) of attending pulmonary rehab to improve his quality of life and improved stamina. Physical assessment reveals heart rate is normal, breath sounds clear to auscultation, no wheezes, rales, or rhonchi. Grip strength equal, strong. Distal pulses 3+ posterior tibial pulses bilaterally. Patient reports he does take medications as prescribed. Patient states she follows a Regular diet. The patient reports no specific efforts to gain or lose weight.. Patient's weight will be monitored closely. Demonstration and practice of PLB using pulse oximeter. Patient able to return demonstration satisfactorily. Safety and hand  hygiene in the exercise area reviewed with patient. Patient voices understanding of the information reviewed. Department expectations discussed with patient and achievable goals were set. The patient shows enthusiasm about attending the program and we look forward to working with this nice gentleman. The patient is scheduled for a 6 min walk test on Thursday, Feb 29, 2016 @ 3:30 pm and to begin exercise on Thursday, Mar 07, 2017 in the 1:30 pm class. [id number].

## 2017-02-12 ENCOUNTER — Ambulatory Visit (INDEPENDENT_AMBULATORY_CARE_PROVIDER_SITE_OTHER): Payer: Medicare Other | Admitting: Pediatrics

## 2017-02-12 ENCOUNTER — Ambulatory Visit (HOSPITAL_BASED_OUTPATIENT_CLINIC_OR_DEPARTMENT_OTHER): Payer: Medicare Other

## 2017-02-12 ENCOUNTER — Encounter: Payer: Self-pay | Admitting: Pediatrics

## 2017-02-12 VITALS — BP 130/76 | HR 64 | Temp 97.6°F | Resp 16 | Ht 71.06 in | Wt 213.6 lb

## 2017-02-12 DIAGNOSIS — J301 Allergic rhinitis due to pollen: Secondary | ICD-10-CM | POA: Diagnosis not present

## 2017-02-12 DIAGNOSIS — J438 Other emphysema: Secondary | ICD-10-CM | POA: Diagnosis not present

## 2017-02-12 DIAGNOSIS — J4551 Severe persistent asthma with (acute) exacerbation: Secondary | ICD-10-CM | POA: Diagnosis not present

## 2017-02-12 DIAGNOSIS — Z8611 Personal history of tuberculosis: Secondary | ICD-10-CM

## 2017-02-12 DIAGNOSIS — T63441A Toxic effect of venom of bees, accidental (unintentional), initial encounter: Secondary | ICD-10-CM | POA: Diagnosis not present

## 2017-02-12 DIAGNOSIS — Z79899 Other long term (current) drug therapy: Secondary | ICD-10-CM | POA: Insufficient documentation

## 2017-02-12 DIAGNOSIS — K219 Gastro-esophageal reflux disease without esophagitis: Secondary | ICD-10-CM | POA: Diagnosis not present

## 2017-02-12 DIAGNOSIS — T7800XA Anaphylactic reaction due to unspecified food, initial encounter: Secondary | ICD-10-CM | POA: Insufficient documentation

## 2017-02-12 DIAGNOSIS — J4550 Severe persistent asthma, uncomplicated: Secondary | ICD-10-CM | POA: Diagnosis not present

## 2017-02-12 MED ORDER — TRIAMCINOLONE ACETONIDE 0.1 % EX CREA: TOPICAL_CREAM | CUTANEOUS | 3 refills | Status: DC

## 2017-02-12 NOTE — Progress Notes (Addendum)
[place] [id number] Dept: [phone]  New Patient Note  Patient ID: [name], male    DOB: Nov 04, 1950  Age: 66 y.o. MRN: [id number] Date of Office Visit: 02/12/2017 Referring provider: Shelda Pal, DO [place] [address] [place], [place] [address]    Chief Complaint: Asthma (chest tightness); Cough; and Wheezing  HPI [name] presents for evaluation of allergic rhinitis, asthma, insect allergy.  Asthma and COPD. He is followed by [name]. He has stage II emphysema. He developed asthma at 66 years of age. He has required frequent courses of prednisone for about 5 years. For the past 6 months he has been on prednisone 10 mg every day. He has a right elevated hemidiaphragm due to bulbar polio when he was age 10. He did have tuberculosis at age 51 which required a 3 month hospitalization. Current medications for asthma, allergies and COPD are loratadine 10 mg once a, fluticasone 2 sprays per nostril once a day, prednisone 10 mg once a day, Pulmicort  0.25 one unit dose twice a day, Brovana 15  MCG's 2 ML twice a day, Proventil 2 puffs every 4 hours if needed or instead albuterol 0.083% one unit dose every 4 hours if needed. His other medications are outlined in the chart and include bisoprolol 5 mg daily. He had a serum IgE of 804 kU/L on 01/15/2017  He has had seasonal allergic rhinitis for many years aggravated by exposure to dust, and the spring time of the year. He has had hives from tomatoes. When he was younger he had an allergic reaction to skin testing. If he is stung by hornets, he has some shortness of breath and he has EpiPen to use. He has had difficulty breathing from aspirin and related compounds. He has broken his nose 8 times and has had repairs. He has difficulty breathing through the left side of his nasal passages. He has 5 dogs in the home and he will not get rid of them . He has large local reactions to tick bites and  chiggers.  He has a history of hypertension and at times an irregular heart beat. He has mild obstructive sleep apnea but does not want CPAP  Review of Systems  Constitutional: Negative.   HENT:       Allergic rhinitis for several years. Broken nose 8 times requiring repairs. Cannot breathe through the left nostril  Eyes:       Cataracts  Respiratory:       Asthma since age 73. He has COPD and an elevated right hemidiaphragm from bulbar polio. He was hospitalized for 3 months at age 53 for tuberculosis  Cardiovascular:       Hypertension and at times an irregular heart beat  Gastrointestinal:       Gastroesophageal reflux. Cholecystectomy.  Genitourinary:       Kidney stones and bladder repairs. He has prostate cancer  Musculoskeletal:       Multiple surgeries after bulbar polio at age 1 including muscle transfers and bone fusions. He has rheumatoid arthritis  Skin:       Hives from tomato. No eczema. Melanomas on his back and scalp with surgical excision  Neurological:       Bulbar polio at 66 years of age  Endo/Heme/Allergies:       Borderline diabetes. No thyroid disease  Psychiatric/Behavioral: Hallucinations: bipolar disorder.       Bipolar disorder    Outpatient Encounter Prescriptions  as of 02/12/2017  Medication Sig  . acetaminophen (TYLENOL) 500 MG tablet Take 500 mg by mouth every 6 (six) hours as needed for headache (pain).  Marland Kitchen albuterol (PROVENTIL) (2.5 MG/3ML) 0.083% nebulizer solution Take 2.5 mg by nebulization every 6 (six) hours as needed for wheezing or shortness of breath.  Marland Kitchen albuterol (VENTOLIN HFA) 108 (90 Base) MCG/ACT inhaler Inhale 2 puffs into the lungs every 4 (four) hours as needed for wheezing or shortness of breath.  . ALPRAZolam (XANAX) 1 MG tablet Take 0.5 tablets (0.5 mg total) by mouth 2 (two) times daily as needed for anxiety. (Patient taking differently: Take 1 mg by mouth 4 (four) times daily. )  . arformoterol (BROVANA) 15 MCG/2ML NEBU Take 2 mLs  (15 mcg total) by nebulization 2 (two) times daily.  . baclofen (LIORESAL) 10 MG tablet Take 10 mg by mouth at bedtime.  . bisoprolol (ZEBETA) 5 MG tablet Take 1 tablet (5 mg total) by mouth daily.  . budesonide (PULMICORT) 0.25 MG/2ML nebulizer solution Take 2 mLs (0.25 mg total) by nebulization 2 (two) times daily.  Marland Kitchen dexlansoprazole (DEXILANT) 60 MG capsule Take 1 capsule (60 mg total) by mouth daily. (Patient taking differently: Take 60 mg by mouth every other day. )  . doxycycline (VIBRAMYCIN) 100 MG capsule   . EPINEPHrine 0.3 mg/0.3 mL IJ SOAJ injection Inject 0.3 mLs (0.3 mg total) into the muscle once. (Patient taking differently: Inject 0.3 mg into the muscle once as needed (severe allergic reactions). )  . HYDROcodone-acetaminophen (NORCO/VICODIN) 5-325 MG tablet Take 1 tablet by mouth every 6 (six) hours as needed for moderate pain.  Marland Kitchen loratadine (CLARITIN) 10 MG tablet Take 10 mg by mouth daily.  Marland Kitchen losartan (COZAAR) 50 MG tablet Take 50 mg by mouth daily.  . meclizine (ANTIVERT) 25 MG tablet Take 25 mg by mouth 2 (two) times daily.  . nitroGLYCERIN (NITROSTAT) 0.4 MG SL tablet Place 1 tablet (0.4 mg total) under the tongue every 5 (five) minutes as needed for chest pain.  Marland Kitchen ondansetron (ZOFRAN) 4 MG tablet Take 1 tablet (4 mg total) by mouth every 12 (twelve) hours as needed for nausea or vomiting.  . OXYGEN Inhale 2 L into the lungs at bedtime. Reported on 11/25/2015  . predniSONE (DELTASONE) 10 MG tablet Take 1 tablet (10 mg total) by mouth daily with breakfast. (Patient taking differently: Take 10 mg by mouth 2 (two) times daily. )  . sodium phosphate (FLEET) 7-19 GM/118ML ENEM Place 1 enema rectally as needed for severe constipation.  . triamcinolone cream (KENALOG) 0.1 % Apply twice a day if needed to red itchy areas below the face to insect bites.  . [DISCONTINUED] albuterol (PROVENTIL HFA;VENTOLIN HFA) 108 (90 Base) MCG/ACT inhaler Inhale 2 puffs into the lungs every 6 (six) hours  as needed for wheezing or shortness of breath.  . [DISCONTINUED] arformoterol (BROVANA) 15 MCG/2ML NEBU Take 2 mLs (15 mcg total) by nebulization 2 (two) times daily.  . [DISCONTINUED] predniSONE (DELTASONE) 10 MG tablet Take 1 tablet (10 mg total) by mouth daily with breakfast. Take 40mg  for 2 days for SOB.   No facility-administered encounter medications on file as of 02/12/2017.      Drug Allergies:  Allergies  Allergen Reactions  . Aspirin Anaphylaxis  . Hornet Venom Anaphylaxis  . Ivp Dye [Iodinated Diagnostic Agents] Anaphylaxis  . Levaquin [Levofloxacin In D5w] Shortness Of Breath and Swelling    In addition: sweating, chest pain, and diarrhea.   . Nsaids Anaphylaxis  .  Penicillins Anaphylaxis    Heart stops Has patient had a PCN reaction causing immediate rash, facial/tongue/throat swelling, SOB or lightheadedness with hypotension: yes Has patient had a PCN reaction causing severe rash involving mucus membranes or skin necrosis: yes Has patient had a PCN reaction that required hospitalization yes Has patient had a PCN reaction occurring within the last 10 years: No If all of the above answers are "NO", then may proceed with Cephalosporin use.   . Tolmetin Anaphylaxis  . Perforomist [Formoterol]     Increased wheezing, shortness of breath  . Buprenorphine Hcl Nausea And Vomiting    Can take with zofran   . Morphine And Related Nausea And Vomiting    Can take with zofran   . Oxycodone Itching and Rash    Family History: [name]'s family history includes Alcohol abuse in his sister and sister; Alzheimer's disease in his paternal aunt and paternal uncle; Alzheimer's disease (age of onset: 66) in his father; Cancer in his mother; Diabetes in his maternal aunt and maternal uncle; Heart attack in his father; Heart disease in his father; Mental illness in his sister; Skin cancer in his mother; Stomach cancer in his father; Thyroid disease in his maternal aunt; Tuberculosis in his  paternal grandfather and paternal grandmother..Family history is negative for asthma, hayfever, sinus problems, eczema, hives, and food allergies, chronic bronchitis or emphysema.  Social and environmental. He has 5 dogs in the house. He is not exposed to cigarette smoking. He quit smoking 5 years ago. He had smoked a pipe for 47 years prior to that time. [place]. He has frequent tick bites and sometimes has some redness around the tick bites. He has frequent chigger bites  Physical Exam: BP 130/76 (BP Location: Left Arm, Patient Position: Sitting, Cuff Size: Normal)   Pulse 64   Temp 97.6 F (36.4 C) (Oral)   Resp 16   Ht 5' 11.06" (1.805 m)   Wt 213 lb 10 oz (96.9 kg)   SpO2 97%   BMI 29.74 kg/m    Physical Exam  Constitutional: He is oriented to person, place, and time. He appears well-developed and well-nourished.  HENT:  Eyes normal. Ears normal. Nose moderate swelling of his nasal turbinates with clear nasal discharge. Pharynx normal.  Neck: Neck supple. No thyromegaly present.  Cardiovascular:  S1 and S2 normal no murmurs  Pulmonary/Chest:  Clear to percussion auscultation except for some decreased breath sounds at the bases  Abdominal: Soft. There is no tenderness (no hepatosplenomegaly).  Lymphadenopathy:    He has no cervical adenopathy.  Neurological: He is alert and oriented to person, place, and time.  Skin:  Scar from removal of a melanoma in his back    Psychiatric: He has a normal mood and affect. His behavior is normal. Judgment and thought content normal.  Vitals reviewed.   Diagnostics: FVC 2.62 L FEV1 1.76 L predicted FVC 4.68 L predicted FEV1 3.48 L. After albuterol 2 puffs FVC 2.680 L FEV1 1.83 L-this shows a moderate reduction in the forced vital capacity and FEV1. There is no significant improvement after albuterol  Allergy skin tests were positive to to grass pollens, weeds, tree pollens, dust mite, cat, dog, cockroach..  Skin testing to Aspergillus was  insignificant     Assessment  Assessment and Plan: 1. Severe persistent asthma with acute exacerbation   2. Anaphylactic reaction to bee sting, accidental or unintentional, initial encounter   3. Severe persistent asthma without  complication   4. Seasonal allergic rhinitis due to pollen   5. Anaphylactic reaction due to food, initial encounter   6. Current use of beta blocker   7. History of tuberculosis   8. Other emphysema ([place])   9. Gastroesophageal reflux disease without esophagitis   8.      Elevated right hemidiaphragm 9.     History of bulbar polio at age 35  Meds ordered this encounter  Medications  . triamcinolone cream (KENALOG) 0.1 %    Sig: Apply twice a day if needed to red itchy areas below the face to insect bites.    Dispense:  45 g    Refill:  3    Patient Instructions  Environmental control of dust mite. Use HEPA air cleaner  in bedroom for the dog allergy Loratadine 10 mg once a day for runny nose or itchy eyes Fluticasone 2 sprays per nostril once a day if needed for stuffy nose Continue on your medications for asthma and COPD I will call you with the results of the your  blood allergy testing for insect allergy and alpha gal allergy If you have an allergic reaction, take Benadryl 4 teaspoonfuls every 4 hours and if you have life-threatening symptoms, inject with EpiPen 0.3 mg I feel that with your multiple medical problems, the use of Xolair would be the first initial step . IgE of 804 this April.  Discuss Xolair  with your  pulmonologist and they could give the [place] there . Continue avoiding aspirin, ibuprofen and related compounds Continue avoiding tomatoes Triamcinolone 0.1% cream twice a day if needed to red itchy areas below the face for insect bites. You  may also apply Benadryl cream 3 times a day for insect bites   Return in about 4 weeks (around 03/12/2017).   Thank you for the opportunity to care for this  patient.  Please do not hesitate to contact me with questions.  Penne Lash, M.D.  [facility] [address] [place], [place] [address] [phone]

## 2017-02-12 NOTE — Patient Instructions (Addendum)
Environmental control of dust mite. Use HEPA air cleaner  in bedroom for the dog allergy Loratadine 10 mg once a day for runny nose or itchy eyes Fluticasone 2 sprays per nostril once a day if needed for stuffy nose Continue on your medications for asthma and COPD I will call you with the results of the your  blood allergy testing for insect allergy and alpha gal allergy If you have an allergic reaction, take Benadryl 4 teaspoonfuls every 4 hours and if you have life-threatening symptoms, inject with EpiPen 0.3 mg I feel that with your multiple medical problems, the use of Xolair would be the first initial step . IgE of 804 this April.  Discuss Xolair  with your  pulmonologist and they could give the [place] there . Continue avoiding aspirin, ibuprofen and related compounds Continue avoiding tomatoes Triamcinolone 0.1% cream twice a day if needed to red itchy areas below the face for insect bites. You  may also apply Benadryl cream 3 times a day for insect bites

## 2017-02-13 ENCOUNTER — Ambulatory Visit (HOSPITAL_BASED_OUTPATIENT_CLINIC_OR_DEPARTMENT_OTHER)
Admission: RE | Admit: 2017-02-13 | Discharge: 2017-02-13 | Disposition: A | Discharge status: Home / Self Care | Payer: Medicare Other | Source: Ambulatory Visit | Attending: Family Medicine | Admitting: Family Medicine

## 2017-02-13 ENCOUNTER — Telehealth: Payer: Self-pay | Admitting: Pulmonary Disease

## 2017-02-13 DIAGNOSIS — I77811 Abdominal aortic ectasia: Secondary | ICD-10-CM | POA: Diagnosis not present

## 2017-02-13 DIAGNOSIS — Z136 Encounter for screening for cardiovascular disorders: Secondary | ICD-10-CM | POA: Diagnosis not present

## 2017-02-13 NOTE — Telephone Encounter (Signed)
lmom tcb x1  We do not carry samples of rescue inhalers if this is what pt is requesting

## 2017-02-14 NOTE — Telephone Encounter (Signed)
lmomtcb x 2 for the pt.  

## 2017-02-15 ENCOUNTER — Ambulatory Visit: Payer: Self-pay | Admitting: Adult Health

## 2017-02-15 NOTE — Telephone Encounter (Signed)
lmomtcb x 3 for the pt.   

## 2017-02-18 ENCOUNTER — Other Ambulatory Visit: Payer: Self-pay | Admitting: *Deleted

## 2017-02-18 NOTE — Telephone Encounter (Signed)
ATC x4 phone went straight to vm. Still left vm to call back to inform we do not carry samples of rescue inhalers. Per Triage protocol will close this message as several attempts have been made to pt but no call back.

## 2017-02-18 NOTE — Patient Outreach (Signed)
   [facility]) Care Management  02/18/2017  [name] March 27, 1951 [id number]    Unsuccessful attempt x 3 outreaches by telephone to make contact with patient , able to leave a hipaa compliant message requesting return call .  Plan  Await return call if no response will,send outreach letter  and follow workflow.   Joylene Draft, RN, [place] Management [phone]- Mobile [phone]- Toll Free Main Office

## 2017-02-19 ENCOUNTER — Encounter: Payer: Self-pay | Admitting: *Deleted

## 2017-02-20 ENCOUNTER — Ambulatory Visit (INDEPENDENT_AMBULATORY_CARE_PROVIDER_SITE_OTHER): Payer: Medicare Other | Admitting: Adult Health

## 2017-02-20 ENCOUNTER — Encounter: Payer: Self-pay | Admitting: Adult Health

## 2017-02-20 DIAGNOSIS — J4550 Severe persistent asthma, uncomplicated: Secondary | ICD-10-CM | POA: Diagnosis not present

## 2017-02-20 DIAGNOSIS — J301 Allergic rhinitis due to pollen: Secondary | ICD-10-CM | POA: Diagnosis not present

## 2017-02-20 NOTE — Progress Notes (Signed)
@Patient  ID: [name], male    DOB: 1951/05/07, 66 y.o.   MRN: [id number]  Chief Complaint  Patient presents with  . Follow-up    COPD     Referring provider: Shelda Pal*  HPI: 66 year old male former smoker followed for COPD and mild obstructive sleep apnea. On O2 2l/m At bedtime  .  Postpolio syndrome as a child.   TEST  [name] 07/08/14: FeV1 51% FeV1/FVC 66% FVC 59% 10/13/2014 ONO on RA NORMAL  HST 11/2015 AHI 8/h  Echo 06/2016 nml LV fn 07/2016 negative myocardial perfusion study 08/2016 negative Holter for 30 days  Head CT 05/2016 BL max sinusitis  02/20/2017 Follow up ; COPD /Asthma /AR  Pt returns for 1 month follow up . Continues to have ongoing symptoms of cough, wheezing and dyspnea that comes and goes. Feels breathing has been going down hill for last 1 year.  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 No birds /chickens.   Followed by Allergy Dr. Shaune Leeks . Found to have IgE 804 . Eos 0.4 . Marland KitchenRAST extremely positive .   Recommended to start on Xolair . Wants pulmonary to start Xolair .   Remains Pulmicort and Brovana Twice daily  . Uses Albuterol neb 1-2  daily .  On prednisone 10mg  daily .   Allergies  Allergen Reactions  . Aspirin Anaphylaxis  . [place]et Venom Anaphylaxis  . Ivp Dye [Iodinated Diagnostic Agents] Anaphylaxis  . Levaquin [Levofloxacin In D5w] Shortness Of Breath and Swelling    In addition: sweating, chest pain, and diarrhea.   . Nsaids Anaphylaxis  . Penicillins Anaphylaxis    Heart stops Has patient had a PCN reaction causing immediate rash, facial/tongue/throat swelling, SOB or lightheadedness with hypotension: yes Has patient had a PCN reaction causing severe rash involving mucus membranes or skin necrosis: yes Has patient had a PCN reaction that required hospitalization yes Has patient had a PCN reaction occurring within the last 10 years: No If all of the above answers are "NO", then may proceed with Cephalosporin use.     . Tolmetin Anaphylaxis  . Perforomist [Formoterol]     Increased wheezing, shortness of breath  . Buprenorphine Hcl Nausea And Vomiting    Can take with zofran   . Morphine And Related Nausea And Vomiting    Can take with zofran   . Oxycodone Itching and Rash    Immunization History  Administered Date(s) Administered  . Influenza Split 07/15/2013  . Influenza, High Dose Seasonal PF 07/19/2016  . Influenza,inj,Quad PF,36+ Mos 07/14/2014, 09/06/2015  . Influenza-Unspecified 03/14/2015  . Pneumococcal Conjugate-13 08/13/2014  . Pneumococcal Polysaccharide-23 08/15/2010, 06/21/2015    Past Medical History:  Diagnosis Date  . Anxiety disorder   . Arthritis    BILATERAL SHOULDERS, ELBOWS AND HANDS AND LEFT HIP AND KNEES--HX CORTISONE SHOTS IN SHOULDERS, ELBOWS, HIP AND KNEES  . Basal cell carcinoma of back 04/30/2016   Dermatologist- Dr Nevada Crane;   MOHS sx- Dr Levada Dy   . Bladder outlet obstruction   . Chronic idiopathic constipation 05/08/2015  . Complication of anesthesia    DIFFICULT WAKING   . COPD (chronic obstructive pulmonary disease) [place] C Frequent exacerbations 01/13/2014   [name] 07/08/14: FeV1 51% FeV1/FVC 66% FVC 59% 10/5/2015ONO RA was normal  10/13/2014  ONO on RA NORMAL   . COPD, frequent exacerbations ([place])    pulmologist-  dr Joya Gaskins--  [place] Rm Stage C.04-25-15 recent COPD exacerbation-much improved now, after tx. in ER [facility].  Marland Kitchen Depression   .  Diabetes mellitus without complication ([place])    BODERLINE - DIET CONTROL  . Dysrhythmia    PVC'S  . Emphysema lung ([place])    stage 2  . Family history of adverse reaction to anesthesia    father would wake up with agitation   . Former smoker 01/13/2014  . Gastroesophageal reflux disease without esophagitis   . Heavy alcohol use 04/30/2016  . History of chronic bronchitis   . History of oxygen administration    oxygen use 2 l/m nasally at bedtime and exertional occasions  . History of rheumatic fever   . History of TB  (tuberculosis)    1984--  hospitalized for 4 month treatment  . History of urinary retention   . Hx of multiple concussions    x 2 per patient   . Hypertension   . Melanoma ([place])   . Nocturnal oxygen desaturation    USES O2 NIGHTLY  . OSA (obstructive sleep apnea) 01/13/2014  . PONV (postoperative nausea and vomiting)   . Post-polio syndrome    polio at age 40--PT WAS IN IRON LUNG; PT WAS IN W/C UNTIL AGE 28; STILL HAS WEAKNESS RIGHT SIDE  . Prostate cancer ([place])   . Schizophrenia ([place])   . Tuberculosis    Hosp 4 months rx , left early   . Urticaria     Tobacco History: History  Smoking Status  . Former Smoker  . Years: 47.00  . Types: Pipe  . Quit date: 08/25/2013  Smokeless Tobacco  . Former Systems developer    Comment: 5-6 times smoking a pipe   Counseling given: Not Answered   Outpatient Encounter Prescriptions as of 02/20/2017  Medication Sig  . acetaminophen (TYLENOL) 500 MG tablet Take 500 mg by mouth every 6 (six) hours as needed for headache (pain).  Marland Kitchen albuterol (PROVENTIL) (2.5 MG/3ML) 0.083% nebulizer solution Take 2.5 mg by nebulization every 6 (six) hours as needed for wheezing or shortness of breath.  Marland Kitchen albuterol (VENTOLIN HFA) 108 (90 Base) MCG/ACT inhaler Inhale 2 puffs into the lungs every 4 (four) hours as needed for wheezing or shortness of breath.  . ALPRAZolam (XANAX) 1 MG tablet Take 0.5 tablets (0.5 mg total) by mouth 2 (two) times daily as needed for anxiety. (Patient taking differently: Take 1 mg by mouth 4 (four) times daily. )  . arformoterol (BROVANA) 15 MCG/2ML NEBU Take 2 mLs (15 mcg total) by nebulization 2 (two) times daily.  . baclofen (LIORESAL) 10 MG tablet Take 10 mg by mouth at bedtime.  . bisoprolol (ZEBETA) 5 MG tablet Take 1 tablet (5 mg total) by mouth daily.  . budesonide (PULMICORT) 0.25 MG/2ML nebulizer solution Take 2 mLs (0.25 mg total) by nebulization 2 (two) times daily.  Marland Kitchen dexlansoprazole (DEXILANT) 60 MG capsule Take 1 capsule (60 mg total)  by mouth daily. (Patient taking differently: Take 60 mg by mouth every other day. )  . EPINEPHrine 0.3 mg/0.3 mL IJ SOAJ injection Inject 0.3 mLs (0.3 mg total) into the muscle once. (Patient taking differently: Inject 0.3 mg into the muscle once as needed (severe allergic reactions). )  . HYDROcodone-acetaminophen (NORCO/VICODIN) 5-325 MG tablet Take 1 tablet by mouth every 6 (six) hours as needed for moderate pain.  Marland Kitchen loratadine (CLARITIN) 10 MG tablet Take 10 mg by mouth daily.  . nitroGLYCERIN (NITROSTAT) 0.4 MG SL tablet Place 1 tablet (0.4 mg total) under the tongue every 5 (five) minutes as needed for chest pain.  Marland Kitchen ondansetron (ZOFRAN) 4 MG tablet Take  1 tablet (4 mg total) by mouth every 12 (twelve) hours as needed for nausea or vomiting.  . OXYGEN Inhale 2 L into the lungs at bedtime. Reported on 11/25/2015  . predniSONE (DELTASONE) 10 MG tablet Take 1 tablet (10 mg total) by mouth daily with breakfast. (Patient taking differently: Take 10 mg by mouth 2 (two) times daily. )  . triamcinolone cream (KENALOG) 0.1 % Apply twice a day if needed to red itchy areas below the face to insect bites.  . [DISCONTINUED] losartan (COZAAR) 50 MG tablet Take 50 mg by mouth daily.  Marland Kitchen doxycycline (VIBRAMYCIN) 100 MG capsule   . [DISCONTINUED] meclizine (ANTIVERT) 25 MG tablet Take 25 mg by mouth 2 (two) times daily.  . [DISCONTINUED] sodium phosphate (FLEET) 7-19 GM/118ML ENEM Place 1 enema rectally as needed for severe constipation.   No facility-administered encounter medications on file as of 02/20/2017.      Review of Systems  Constitutional:   No  weight loss, night sweats,  Fevers, chills,  +fatigue, or  lassitude.  HEENT:   No headaches,  Difficulty swallowing,  Tooth/dental problems, or  Sore throat,                No sneezing, itching, ear ache, + nasal congestion, post nasal drip,   CV:  No chest pain,  Orthopnea, PND, swelling in lower extremities, anasarca, dizziness, palpitations,  syncope.   GI  No heartburn, indigestion, abdominal pain, nausea, vomiting, diarrhea, change in bowel habits, loss of appetite, bloody stools.   Resp:   No chest wall deformity  Skin: no rash or lesions.  GU: no dysuria, change in color of urine, no urgency or frequency.  No flank pain, no hematuria   MS:  No joint pain or swelling.  No decreased range of motion.  No back pain.    Physical Exam  BP 124/68 (BP Location: Left Arm, Cuff Size: Normal)   Pulse 67   Ht 6' (1.829 m)   Wt 217 lb 6.4 oz (98.6 kg)   SpO2 98%   BMI 29.48 kg/m   GEN: A/Ox3; pleasant , NAD, well nourished    HEENT:  [place]/AT,  EACs-clear, TMs-wnl, NOSE-clear, THROAT-clear, no lesions, no postnasal drip or exudate noted. Poor dentition   NECK:  Supple w/ fair ROM; no JVD; normal carotid impulses w/o bruits; no thyromegaly or nodules palpated; no lymphadenopathy.  No stridor   RESP  Clear  P & A; w/o, wheezes/ rales/ or rhonchi. no accessory muscle use, no dullness to percussion , speaking in full sentences  CARD:  RRR, no m/r/g, no peripheral edema, pulses intact, no cyanosis or clubbing.  GI:   Soft & nt; nml bowel sounds; no organomegaly or masses detected.   Musco: Warm bil, no deformities or joint swelling noted.   Neuro: alert, no focal deficits noted.    Skin: Warm, no lesions or rashes    Lab Results:  CBC    Component Value Date/Time   WBC 7.3 01/04/2017 2111   RBC 4.78 01/04/2017 2111   HGB 15.3 01/04/2017 2111   HCT 44.5 01/04/2017 2111   PLT 256 01/04/2017 2111   MCV 93.1 01/04/2017 2111   MCH 32.0 01/04/2017 2111   MCHC 34.4 01/04/2017 2111   RDW 13.6 01/04/2017 2111   LYMPHSABS 2.0 01/04/2017 2111   MONOABS 0.5 01/04/2017 2111   EOSABS 0.4 01/04/2017 2111   BASOSABS 0.0 01/04/2017 2111    BMET  ProBNP No results found for: PROBNP  Imaging: US Aorta Initial Medicare Screen  Result Date: 02/13/2017 CLINICAL DATA:  AAA screen. EXAM: US ABDOMINAL AORTA MEDICARE SCREENING  TECHNIQUE: Ultrasound examination of the abdominal aorta was performed as a screening evaluation for abdominal aortic aneurysm. COMPARISON:  CT 10/05/2015. FINDINGS: Maximum Diameter: 2.6 cm ABDOMINAL AORTA Proximal:  2.6 cm Mid:  2.1 cm Distal:  1.7 cm IMPRESSION: 2.6 cm abdominal aortic ectasia. Ectatic abdominal aorta at risk for aneurysm development. Recommend followup by ultrasound in 5 years. This recommendation follows ACR consensus guidelines: White Paper of the ACR Incidental Findings Committee II on Vascular Findings. J Am Coll Radiol 2013; 10:789-794. Electronically Signed   By: Marcello Moores  Register   On: 02/13/2017 11:00     Assessment & Plan:   Severe persistent asthma without complication Difficult to control Asthma with high IgE and ++RAST  Allergy eval with recs for Xolair   Plan  Patient Instructions  Begin Xolair approval process. Continue on Brovana  and Pulmicort nebulizer twice daily Use albuterol as needed for wheezing, shortness of breath. Continue prednisone 10 mg daily Follow-up with Dr. Elsworth Soho in 4 weeks and as needed Please contact office for sooner follow up if symptoms do not improve or worsen or seek emergency care      Allergic rhinitis Cont on current regimen  Add Elveria Royals, NP 02/20/2017

## 2017-02-20 NOTE — Patient Instructions (Addendum)
Begin Xolair approval process. Continue on Brovana  and Pulmicort nebulizer twice daily Use albuterol as needed for wheezing, shortness of breath. Continue prednisone 10 mg daily Follow-up with Dr. Elsworth Soho in 4 weeks and as needed Please contact office for sooner follow up if symptoms do not improve or worsen or seek emergency care

## 2017-02-20 NOTE — Assessment & Plan Note (Signed)
Difficult to control Asthma with high IgE and ++RAST  Allergy eval with recs for Xolair   Plan  Patient Instructions  Begin Xolair approval process. Continue on Brovana  and Pulmicort nebulizer twice daily Use albuterol as needed for wheezing, shortness of breath. Continue prednisone 10 mg daily Follow-up with Dr. Elsworth Soho in 4 weeks and as needed Please contact office for sooner follow up if symptoms do not improve or worsen or seek emergency care

## 2017-02-20 NOTE — Assessment & Plan Note (Signed)
Cont on current regimen  Add xolair

## 2017-02-26 ENCOUNTER — Ambulatory Visit (HOSPITAL_COMMUNITY): Payer: Self-pay

## 2017-02-28 ENCOUNTER — Ambulatory Visit (HOSPITAL_COMMUNITY): Payer: Self-pay

## 2017-02-28 ENCOUNTER — Other Ambulatory Visit: Payer: Self-pay | Admitting: *Deleted

## 2017-02-28 ENCOUNTER — Encounter (HOSPITAL_COMMUNITY)
Admission: RE | Admit: 2017-02-28 | Discharge: 2017-02-28 | Disposition: A | Discharge status: Home / Self Care | Payer: Medicare Other | Source: Ambulatory Visit | Attending: Pulmonary Disease | Admitting: Pulmonary Disease

## 2017-02-28 DIAGNOSIS — J449 Chronic obstructive pulmonary disease, unspecified: Secondary | ICD-10-CM | POA: Diagnosis not present

## 2017-02-28 DIAGNOSIS — J439 Emphysema, unspecified: Secondary | ICD-10-CM

## 2017-02-28 NOTE — Progress Notes (Signed)
Pulmonary Individual Treatment Plan  Patient Details  Name: [name] MRN: [id number] Date of Birth: 1951-04-02 Referring Provider:     Pulmonary Rehab Walk Test from 02/28/2017 in [place]  Referring Provider  Dr. Elsworth Soho      Initial Encounter Date:    Pulmonary Rehab Walk Test from 02/28/2017 in [place]  Date  02/28/17  Referring Provider  Dr. Elsworth Soho      Visit Diagnosis: Pulmonary emphysema, unspecified emphysema type ([place])  Patient's Home Medications on Admission:   Current Outpatient Prescriptions:  .  acetaminophen (TYLENOL) 500 MG tablet, Take 500 mg by mouth every 6 (six) hours as needed for headache (pain)., Disp: , Rfl:  .  albuterol (PROVENTIL) (2.5 MG/3ML) 0.083% nebulizer solution, Take 2.5 mg by nebulization every 6 (six) hours as needed for wheezing or shortness of breath., Disp: , Rfl:  .  albuterol (VENTOLIN HFA) 108 (90 Base) MCG/ACT inhaler, Inhale 2 puffs into the lungs every 4 (four) hours as needed for wheezing or shortness of breath., Disp: , Rfl:  .  ALPRAZolam (XANAX) 1 MG tablet, Take 0.5 tablets (0.5 mg total) by mouth 2 (two) times daily as needed for anxiety. (Patient taking differently: Take 1 mg by mouth 4 (four) times daily. ), Disp: 60 tablet, Rfl: 0 .  arformoterol (BROVANA) 15 MCG/2ML NEBU, Take 2 mLs (15 mcg total) by nebulization 2 (two) times daily., Disp: 120 mL, Rfl: 6 .  baclofen (LIORESAL) 10 MG tablet, Take 10 mg by mouth at bedtime., Disp: , Rfl:  .  bisoprolol (ZEBETA) 5 MG tablet, Take 1 tablet (5 mg total) by mouth daily., Disp: 30 tablet, Rfl: 1 .  budesonide (PULMICORT) 0.25 MG/2ML nebulizer solution, Take 2 mLs (0.25 mg total) by nebulization 2 (two) times daily., Disp: 120 mL, Rfl: 6 .  dexlansoprazole (DEXILANT) 60 MG capsule, Take 1 capsule (60 mg total) by mouth daily. (Patient taking differently: Take 60 mg by mouth every other day. ), Disp: 90 capsule, Rfl: 1 .   doxycycline (VIBRAMYCIN) 100 MG capsule, , Disp: , Rfl:  .  EPINEPHrine 0.3 mg/0.3 mL IJ SOAJ injection, Inject 0.3 mLs (0.3 mg total) into the muscle once. (Patient taking differently: Inject 0.3 mg into the muscle once as needed (severe allergic reactions). ), Disp: 1 Device, Rfl: 1 .  HYDROcodone-acetaminophen (NORCO/VICODIN) 5-325 MG tablet, Take 1 tablet by mouth every 6 (six) hours as needed for moderate pain., Disp: , Rfl:  .  loratadine (CLARITIN) 10 MG tablet, Take 10 mg by mouth daily., Disp: , Rfl:  .  nitroGLYCERIN (NITROSTAT) 0.4 MG SL tablet, Place 1 tablet (0.4 mg total) under the tongue every 5 (five) minutes as needed for chest pain., Disp: 30 tablet, Rfl: 3 .  ondansetron (ZOFRAN) 4 MG tablet, Take 1 tablet (4 mg total) by mouth every 12 (twelve) hours as needed for nausea or vomiting., Disp: 30 tablet, Rfl: 2 .  OXYGEN, Inhale 2 L into the lungs at bedtime. Reported on 11/25/2015, Disp: , Rfl:  .  predniSONE (DELTASONE) 10 MG tablet, Take 1 tablet (10 mg total) by mouth daily with breakfast. (Patient taking differently: Take 10 mg by mouth 2 (two) times daily. ), Disp: 30 tablet, Rfl: 0 .  triamcinolone cream (KENALOG) 0.1 %, Apply twice a day if needed to red itchy areas below the face to insect bites., Disp: 45 g, Rfl: 3  Past Medical History: Past Medical History:  Diagnosis Date  .  Anxiety disorder   . Arthritis    BILATERAL SHOULDERS, ELBOWS AND HANDS AND LEFT HIP AND KNEES--HX CORTISONE SHOTS IN SHOULDERS, ELBOWS, HIP AND KNEES  . Basal cell carcinoma of back 04/30/2016   Dermatologist- Dr Nevada Crane;   MOHS sx- Dr Levada Dy   . Bladder outlet obstruction   . Chronic idiopathic constipation 05/08/2015  . Complication of anesthesia    DIFFICULT WAKING   . COPD (chronic obstructive pulmonary disease) Gold C Frequent exacerbations 01/13/2014   [name] 07/08/14: FeV1 51% FeV1/FVC 66% FVC 59% 10/5/2015ONO RA was normal  10/13/2014  ONO on RA NORMAL   . COPD, frequent exacerbations ([place])     pulmologist-  dr Joya Gaskins--  [place] Rm Stage C.04-25-15 recent COPD exacerbation-much improved now, after tx. in ER [facility].  Marland Kitchen Depression   . Diabetes mellitus without complication ([place])    BODERLINE - DIET CONTROL  . Dysrhythmia    PVC'S  . Emphysema lung ([place])    stage 2  . Family history of adverse reaction to anesthesia    father would wake up with agitation   . Former smoker 01/13/2014  . Gastroesophageal reflux disease without esophagitis   . Heavy alcohol use 04/30/2016  . History of chronic bronchitis   . History of oxygen administration    oxygen use 2 l/m nasally at bedtime and exertional occasions  . History of rheumatic fever   . History of TB (tuberculosis)    1984--  hospitalized for 4 month treatment  . History of urinary retention   . Hx of multiple concussions    x 2 per patient   . Hypertension   . Melanoma ([place])   . Nocturnal oxygen desaturation    USES O2 NIGHTLY  . OSA (obstructive sleep apnea) 01/13/2014  . PONV (postoperative nausea and vomiting)   . Post-polio syndrome    polio at age 10--PT WAS IN IRON LUNG; PT WAS IN W/C UNTIL AGE 5; STILL HAS WEAKNESS RIGHT SIDE  . Prostate cancer ([place])   . Schizophrenia ([place])   . Tuberculosis    Hosp 4 months rx , left early   . Urticaria     Tobacco Use: History  Smoking Status  . Former Smoker  . Years: 47.00  . Types: Pipe  . Quit date: 08/25/2013  Smokeless Tobacco  . Former Systems developer    Comment: 5-6 times smoking a pipe    Labs: Recent Chemical engineer    Labs for ITP Cardiac and Pulmonary Rehab Latest Ref Rng & Units 11/22/2015 04/30/2016 05/07/2016 07/17/2016 11/03/2016   Cholestrol 0 - 200 mg/dL - - 241(H) 181 -   LDLCALC <130 mg/dL - - 150(H) - -   LDLDIRECT mg/dL - - - 103.0 -   HDL >39.00 mg/dL - - 59 48.00 -   Trlycerides 0.0 - 149.0 mg/dL - - 162(H) 245.0(H) -   Hemoglobin A1c - 6.8(H) 5.9 - - -   PHART 7.350 - 7.450 - - - - 7.395   PCO2ART 32.0 - 48.0 mmHg - - - - 29.5(L)   HCO3 20.0 - 28.0  mmol/L - - - - 18.1(L)   TCO2 0 - 100 mmol/L - - - - 19   ACIDBASEDEF 0.0 - 2.0 mmol/L - - - - 6.0(H)   O2SAT % - - - - 93.0      Capillary Blood Glucose: Lab Results  Component Value Date   GLUCAP 259 (H) 11/06/2016   GLUCAP 188 (H) 11/06/2016   GLUCAP 184 (H)  11/05/2016   GLUCAP 177 (H) 11/05/2016   GLUCAP 254 (H) 11/05/2016     ADL UCSD:   Pulmonary Function Assessment:     Pulmonary Function Assessment - 02/11/17 1300      Breath   Bilateral Breath Sounds Clear   Shortness of Breath Yes;Limiting activity      Exercise Target Goals: Date: 02/28/17  Exercise Program Goal: Individual exercise prescription set with THRR, safety & activity barriers. Participant demonstrates ability to understand and report RPE using BORG scale, to self-measure pulse accurately, and to acknowledge the importance of the exercise prescription.  Exercise Prescription Goal: Starting with aerobic activity 30 plus minutes a day, 3 days per week for initial exercise prescription. Provide home exercise prescription and guidelines that participant acknowledges understanding prior to discharge.  Activity Barriers & Risk Stratification:   6 Minute Walk:     6 Minute Walk    Row Name 02/28/17 1601         6 Minute Walk   Phase Initial     Distance 1400 feet     Walk Time 6 minutes     # of Rest Breaks 0     MPH 2.65     METS 3.07     RPE 13     Perceived Dyspnea  3     Symptoms Yes (comment)     Comments "woozy" at the end of walk test, knee pain (normal), stated he felt palpitation x1     Resting HR 66 bpm     Resting BP 118/70     Max Ex. HR 108 bpm     Max Ex. BP 154/80       Interval HR   Baseline HR 66     1 Minute HR 74     2 Minute HR 74     3 Minute HR 103     4 Minute HR 108     5 Minute HR 100     6 Minute HR 97     2 Minute Post HR 76     Interval Heart Rate? Yes       Interval Oxygen   Interval Oxygen? Yes     Baseline Oxygen Saturation % 96 %      Baseline Liters of Oxygen 0 L     1 Minute Oxygen Saturation % 94 %     1 Minute Liters of Oxygen 0 L     2 Minute Oxygen Saturation % 90 %     2 Minute Liters of Oxygen 0 L     3 Minute Oxygen Saturation % 95 %     3 Minute Liters of Oxygen 0 L     4 Minute Oxygen Saturation % 95 %     4 Minute Liters of Oxygen 0 L     5 Minute Oxygen Saturation % 95 %     5 Minute Liters of Oxygen 0 L     6 Minute Oxygen Saturation % 94 %     6 Minute Liters of Oxygen 0 L     2 Minute Post Oxygen Saturation % 96 %     2 Minute Post Liters of Oxygen 0 L        Oxygen Initial Assessment:     Oxygen Initial Assessment - 02/28/17 1603      Initial 6 min Walk   Oxygen Used None     Program Oxygen Prescription   Program Oxygen Prescription None  Oxygen Re-Evaluation:   Oxygen Discharge (Final Oxygen Re-Evaluation):   Initial Exercise Prescription:     Initial Exercise Prescription - 02/28/17 1600      Date of Initial Exercise RX and Referring Provider   Date 02/28/17   Referring Provider Dr. Elsworth Soho     Bike   Level 0.5   Minutes 17     NuStep   Level 2   Minutes 17   METs 1.5     Track   Laps 10   Minutes 17     Prescription Details   Frequency (times per week) 2   Duration Progress to 45 minutes of aerobic exercise without signs/symptoms of physical distress     Intensity   THRR 40-80% of Max Heartrate 61-122   Ratings of Perceived Exertion 11-13   Perceived Dyspnea 0-4     Progression   Progression Continue progressive overload as per policy without signs/symptoms or physical distress.     Resistance Training   Training Prescription Yes   Weight blue bands   Reps 10-15      Perform Capillary Blood Glucose checks as needed.  Exercise Prescription Changes:   Exercise Comments:   Exercise Goals and Review:   Exercise Goals Re-Evaluation :   Discharge Exercise Prescription (Final Exercise Prescription Changes):   Nutrition:  Target Goals:  Understanding of nutrition guidelines, daily intake of sodium 1500mg , cholesterol 200mg , calories 30% from fat and 7% or less from saturated fats, daily to have 5 or more servings of fruits and vegetables.  Biometrics:     Pre Biometrics - 02/11/17 1304      Pre Biometrics   Grip Strength 58 kg       Nutrition Therapy Plan and Nutrition Goals:   Nutrition Discharge: Rate Your Plate Scores:   Nutrition Goals Re-Evaluation:   Nutrition Goals Discharge (Final Nutrition Goals Re-Evaluation):   Psychosocial: Target Goals: Acknowledge presence or absence of significant depression and/or stress, maximize coping skills, provide positive support system. Participant is able to verbalize types and ability to use techniques and skills needed for reducing stress and depression.  Initial Review & Psychosocial Screening:     Initial Psych Review & Screening - 02/11/17 1305      Initial Review   Current issues with None Identified     Family Dynamics   Good Support System? Yes     Barriers   Psychosocial barriers to participate in program There are no identifiable barriers or psychosocial needs.     Screening Interventions   Interventions Encouraged to exercise      Quality of Life Scores:   PHQ-9: Recent Review Flowsheet Data    Depression screen [facility] 2/9 02/11/2017 11/16/2016 06/28/2016 12/12/2015   Decreased Interest 3 0 0 1   Down, Depressed, Hopeless 3 1 0 1   PHQ - 2 Score 6 1 0 2   Altered sleeping 1 - - 1   Tired, decreased energy 1 - - 1   Change in appetite 0 - - 1   Feeling bad or failure about yourself  0 - - 1   Trouble concentrating 0 - - 1   Moving slowly or fidgety/restless 1 - - 1   Suicidal thoughts 0 - - 1   PHQ-9 Score 9 - - 9   Difficult doing work/chores Somewhat difficult - - Somewhat difficult     Interpretation of Total Score  Total Score Depression Severity:  1-4 = Minimal depression, 5-9 = Mild depression, 10-14 =  Moderate depression, 15-19  = Moderately severe depression, 20-27 = Severe depression   Psychosocial Evaluation and Intervention:     Psychosocial Evaluation - 02/11/17 1306      Psychosocial Evaluation & Interventions   Interventions Encouraged to exercise with the program and follow exercise prescription      Psychosocial Re-Evaluation:   Psychosocial Discharge (Final Psychosocial Re-Evaluation):   Education: Education Goals: Education classes will be provided on a weekly basis, covering required topics. Participant will state understanding/return demonstration of topics presented.  Learning Barriers/Preferences:     Learning Barriers/Preferences - 02/11/17 1259      Learning Barriers/Preferences   Learning Barriers None   Learning Preferences Verbal Instruction;Individual Instruction      Education Topics: Risk Factor Reduction:  -Group instruction that is supported by a PowerPoint presentation. Instructor discusses the definition of a risk factor, different risk factors for pulmonary disease, and how the heart and lungs work together.     Nutrition for Pulmonary Patient:  -Group instruction provided by PowerPoint slides, verbal discussion, and written materials to support subject matter. The instructor gives an explanation and review of healthy diet recommendations, which includes a discussion on weight management, recommendations for fruit and vegetable consumption, as well as protein, fluid, caffeine, fiber, sodium, sugar, and alcohol. Tips for eating when patients are short of breath are discussed.   Pursed Lip Breathing:  -Group instruction that is supported by demonstration and informational handouts. Instructor discusses the benefits of pursed lip and diaphragmatic breathing and detailed demonstration on how to preform both.     Oxygen Safety:  -Group instruction provided by PowerPoint, verbal discussion, and written material to support subject matter. There is an overview of "What is  Oxygen" and "Why do we need it".  Instructor also reviews how to create a safe environment for oxygen use, the importance of using oxygen as prescribed, and the risks of noncompliance. There is a brief discussion on traveling with oxygen and resources the patient may utilize.   Oxygen Equipment:  -Group instruction provided by [facility] Staff utilizing handouts, written materials, and equipment demonstrations.   Signs and Symptoms:  -Group instruction provided by written material and verbal discussion to support subject matter. Warning signs and symptoms of infection, stroke, and heart attack are reviewed and when to call the physician/911 reinforced. Tips for preventing the spread of infection discussed.   Advanced Directives:  -Group instruction provided by verbal instruction and written material to support subject matter. Instructor reviews Advanced Directive laws and proper instruction for filling out document.   Pulmonary Video:  -Group video education that reviews the importance of medication and oxygen compliance, exercise, good nutrition, pulmonary hygiene, and pursed lip and diaphragmatic breathing for the pulmonary patient.   Exercise for the Pulmonary Patient:  -Group instruction that is supported by a PowerPoint presentation. Instructor discusses benefits of exercise, core components of exercise, frequency, duration, and intensity of an exercise routine, importance of utilizing pulse oximetry during exercise, safety while exercising, and options of places to exercise outside of rehab.     Pulmonary Medications:  -Verbally interactive group education provided by instructor with focus on inhaled medications and proper administration.   Anatomy and Physiology of the Respiratory System and Intimacy:  -Group instruction provided by PowerPoint, verbal discussion, and written material to support subject matter. Instructor reviews respiratory cycle and anatomical components of the  respiratory system and their functions. Instructor also reviews differences in obstructive and restrictive respiratory diseases with examples of each. Intimacy, Sex,  and Sexuality differences are reviewed with a discussion on how relationships can change when diagnosed with pulmonary disease. Common sexual concerns are reviewed.   MD DAY -A group question and answer session with a medical doctor that allows participants to ask questions that relate to their pulmonary disease state.   OTHER EDUCATION -Group or individual verbal, written, or video instructions that support the educational goals of the pulmonary rehab program.   Knowledge Questionnaire Score:   Core Components/Risk Factors/Patient Goals at Admission:     Personal Goals and Risk Factors at Admission - 02/11/17 1304      Core Components/Risk Factors/Patient Goals on Admission   Improve shortness of breath with ADL's Yes   Intervention Provide education, individualized exercise plan and daily activity instruction to help decrease symptoms of SOB with activities of daily living.   Expected Outcomes Short Term: Achieves a reduction of symptoms when performing activities of daily living.   Develop more efficient breathing techniques such as purse lipped breathing and diaphragmatic breathing; and practicing self-pacing with activity Yes   Intervention Provide education, demonstration and support about specific breathing techniuqes utilized for more efficient breathing. Include techniques such as pursed lipped breathing, diaphragmatic breathing and self-pacing activity.   Expected Outcomes Short Term: Participant will be able to demonstrate and use breathing techniques as needed throughout daily activities.   Increase knowledge of respiratory medications and ability to use respiratory devices properly  Yes   Intervention Provide education and demonstration as needed of appropriate use of medications, inhalers, and oxygen therapy.    Expected Outcomes Short Term: Achieves understanding of medications use. Understands that oxygen is a medication prescribed by physician. Demonstrates appropriate use of inhaler and oxygen therapy.      Core Components/Risk Factors/Patient Goals Review:    Core Components/Risk Factors/Patient Goals at Discharge (Final Review):    ITP Comments:   Comments:

## 2017-02-28 NOTE — Patient Outreach (Signed)
[place] [facility]) Care Management  02/28/2017  [name] 03/14/1951 [id number]  Telephone assessment  Received in basket message to call  Patient.  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 He discussed his recent visit to allergy and pulmonary doctor, patient discussed that he is allergic to multiple substances. Patient states pulmonary office is in the process getting approval of a medication Xolair that will need to be administered in the office, patient discussed concerns.  Patient reports that he continues to take medications as prescribed,and using albuterol as needed for wheezing. Patient discussed he is getting a new mask to wear when working outside.  Patient to begin Pulmonary rehab on today and looking forward to it.   Patient states he is managing well at home,  Keeping medical appointments ,denies need for home visit, care goals met and agreeable with case closure Patient has [facility] contact number if new concerns arise.    Plan Will close case per protocol, sending PCP case closure letter and notifying CMA of case closure.    Joylene Draft, RN, [place] [facility] [phone]- Mobile [phone]- Toll Free Main Office

## 2017-03-01 ENCOUNTER — Encounter: Payer: Self-pay | Admitting: *Deleted

## 2017-03-07 ENCOUNTER — Encounter (HOSPITAL_COMMUNITY)
Admission: RE | Admit: 2017-03-07 | Discharge: 2017-03-07 | Disposition: A | Discharge status: Home / Self Care | Payer: Medicare Other | Source: Ambulatory Visit | Attending: Pulmonary Disease | Admitting: Pulmonary Disease

## 2017-03-07 DIAGNOSIS — J449 Chronic obstructive pulmonary disease, unspecified: Secondary | ICD-10-CM | POA: Diagnosis not present

## 2017-03-07 NOTE — Progress Notes (Signed)
Daily Session Note  Patient Details  Name: [name] MRN: [id number] Date of Birth: 10/18/1950 Referring Provider:     Pulmonary Rehab Walk Test from 02/28/2017 in [place]  Referring Provider  Dr. Elsworth Soho      Encounter Date: 03/07/2017  Check In:     Session Check In - 03/07/17 1549      Check-In   Location [facility]   Staff Present Ramon Dredge, RN, MHA;Joan Leonia Reeves, RN, Luisa Hart, RN, BSN   Supervising physician immediately available to respond to emergencies [facility] immediately available   Physician(s) Dr. Karleen Hampshire   Medication changes reported     No   Fall or balance concerns reported    No   Tobacco Cessation No Change   Warm-up and Cool-down Performed as group-led instruction   Resistance Training Performed No   VAD Patient? No     Pain Assessment   Currently in Pain? No/denies   Multiple Pain Sites No      Capillary Blood Glucose: No results found for this or any previous visit (from the past 24 hour(s)).      Exercise Prescription Changes - 03/07/17 1500      Response to Exercise   Blood Pressure (Admit) 130/56   Blood Pressure (Exercise) 122/70   Blood Pressure (Exit) 116/66   Heart Rate (Admit) 61 bpm   Heart Rate (Exercise) 83 bpm   Heart Rate (Exit) 70 bpm   Rating of Perceived Exertion (Exercise) 11   Perceived Dyspnea (Exercise) 2   Comments pt was oriented to exercise equipment   Duration Progress to 45 minutes of aerobic exercise without signs/symptoms of physical distress   Intensity THRR unchanged     Progression   Progression Continue to progress workloads to maintain intensity without signs/symptoms of physical distress.     Resistance Training   Training Prescription Yes   Weight blue bands   Reps 10-15   Time 10 Minutes     Bike   Level 0.5   Minutes 17     NuStep   Level 2   Minutes 17   METs 1.5      History  Smoking Status  . Former  Smoker  . Years: 47.00  . Types: Pipe  . Quit date: 08/25/2013  Smokeless Tobacco  . Former Systems developer    Comment: 5-6 times smoking a pipe    Goals Met:  Exercise tolerated well No report of cardiac concerns or symptoms Strength training completed today  Goals Unmet:  Not Applicable   Comments: Service time is from 1330 to 1535    Dr. Rush Farmer is Medical Director for Pulmonary Rehab at [facility].

## 2017-03-09 ENCOUNTER — Encounter (HOSPITAL_BASED_OUTPATIENT_CLINIC_OR_DEPARTMENT_OTHER): Payer: Self-pay | Admitting: *Deleted

## 2017-03-09 ENCOUNTER — Emergency Department (HOSPITAL_BASED_OUTPATIENT_CLINIC_OR_DEPARTMENT_OTHER): Payer: Medicare Other

## 2017-03-09 ENCOUNTER — Emergency Department (HOSPITAL_BASED_OUTPATIENT_CLINIC_OR_DEPARTMENT_OTHER)
Admission: EM | Admit: 2017-03-09 | Discharge: 2017-03-09 | Disposition: A | Discharge status: Home / Self Care | Payer: Medicare Other | Attending: Emergency Medicine | Admitting: Emergency Medicine

## 2017-03-09 DIAGNOSIS — J441 Chronic obstructive pulmonary disease with (acute) exacerbation: Secondary | ICD-10-CM

## 2017-03-09 DIAGNOSIS — I10 Essential (primary) hypertension: Secondary | ICD-10-CM | POA: Insufficient documentation

## 2017-03-09 DIAGNOSIS — E119 Type 2 diabetes mellitus without complications: Secondary | ICD-10-CM | POA: Diagnosis not present

## 2017-03-09 DIAGNOSIS — J449 Chronic obstructive pulmonary disease, unspecified: Secondary | ICD-10-CM | POA: Diagnosis present

## 2017-03-09 DIAGNOSIS — R0602 Shortness of breath: Secondary | ICD-10-CM | POA: Diagnosis not present

## 2017-03-09 DIAGNOSIS — Z8546 Personal history of malignant neoplasm of prostate: Secondary | ICD-10-CM | POA: Insufficient documentation

## 2017-03-09 DIAGNOSIS — Z87891 Personal history of nicotine dependence: Secondary | ICD-10-CM | POA: Diagnosis not present

## 2017-03-09 DIAGNOSIS — Z85820 Personal history of malignant melanoma of skin: Secondary | ICD-10-CM | POA: Diagnosis not present

## 2017-03-09 DIAGNOSIS — Z79899 Other long term (current) drug therapy: Secondary | ICD-10-CM | POA: Diagnosis not present

## 2017-03-09 DIAGNOSIS — Z85828 Personal history of other malignant neoplasm of skin: Secondary | ICD-10-CM | POA: Insufficient documentation

## 2017-03-09 MED ORDER — ALBUTEROL SULFATE (2.5 MG/3ML) 0.083% IN NEBU
5.0000 mg | INHALATION_SOLUTION | Freq: Once | RESPIRATORY_TRACT | Status: AC
  Administered 2017-03-09: 5 mg via RESPIRATORY_TRACT
  Filled 2017-03-09: qty 6

## 2017-03-09 MED ORDER — ALBUTEROL SULFATE (2.5 MG/3ML) 0.083% IN NEBU
2.5000 mg | INHALATION_SOLUTION | Freq: Once | RESPIRATORY_TRACT | Status: AC
  Administered 2017-03-09: 2.5 mg via RESPIRATORY_TRACT

## 2017-03-09 MED ORDER — DOXYCYCLINE HYCLATE 100 MG PO CAPS: 100.0000 mg | ORAL_CAPSULE | Freq: Two times a day (BID) | ORAL | 0 refills | Status: DC

## 2017-03-09 MED ORDER — PREDNISONE 50 MG PO TABS
60.0000 mg | ORAL_TABLET | Freq: Once | ORAL | Status: AC
  Administered 2017-03-09: 60 mg via ORAL
  Filled 2017-03-09: qty 1

## 2017-03-09 MED ORDER — IPRATROPIUM BROMIDE 0.02 % IN SOLN
0.5000 mg | Freq: Once | RESPIRATORY_TRACT | Status: AC
  Administered 2017-03-09: 0.5 mg via RESPIRATORY_TRACT
  Filled 2017-03-09: qty 2.5

## 2017-03-09 MED ORDER — IPRATROPIUM-ALBUTEROL 0.5-2.5 (3) MG/3ML IN SOLN
3.0000 mL | Freq: Once | RESPIRATORY_TRACT | Status: AC
  Administered 2017-03-09: 3 mL via RESPIRATORY_TRACT

## 2017-03-09 MED ORDER — PREDNISONE 10 MG PO TABS
ORAL_TABLET | ORAL | Status: AC
  Filled 2017-03-09: qty 1

## 2017-03-09 NOTE — ED Notes (Signed)
Pt's Hr noted to drop into the 30's for a brief period. Pt reports having a "sinking" feeling at that time. Pt's HR quickly returned to baseline in the 70's. EKG repeated. EDP notified.

## 2017-03-09 NOTE — ED Provider Notes (Signed)
[facility] DEPT [facility] Provider Note   CSN: [id number] Arrival date & time: 03/09/17  1441 By signing my name below, I, Fabian Sharp, attest that this documentation has been prepared under the direction and in the presence of Blanchie Dessert, MD . Electronically Signed: Fabian Sharp, ED Scribe. 03/09/2017. 3:19 PM.  History   Chief Complaint Chief Complaint  Patient presents with  . COPD    HPI Comments:  [name] is a 66 y.o. male with a history COPD, DM, HTN and allergies who presents to the Emergency Department complaining of persistent, gradually worsening shortness of breath onset two days. He reports associated chest tightness, chest congestion, mild cough intermittently productive of sputum, and chest pain secondary to deep inspiration. Pt per, oxygen use has been longer for the past two days. Breathing is improved when sitting up and is exacerbated by lying flat. Pt has increased his daily Brovana and used his albuterol nebulizer with no significant relief. He also reports taking doxycycline x2 days with no reported relief. Pt wears oxygen at night at baseline but has been using in the day since shortness of breath began. Pt is former smoker, and has not smoked in 4 years. Pt denies any LE edema or fever and has no other acute complaints at this time.  The history is provided by the patient. No language interpreter was used.   Past Medical History:  Diagnosis Date  . Anxiety disorder   . Arthritis    BILATERAL SHOULDERS, ELBOWS AND HANDS AND LEFT HIP AND KNEES--HX CORTISONE SHOTS IN SHOULDERS, ELBOWS, HIP AND KNEES  . Basal cell carcinoma of back 04/30/2016   Dermatologist- Dr Nevada Crane;   MOHS sx- Dr Levada Dy   . Bladder outlet obstruction   . Chronic idiopathic constipation 05/08/2015  . Complication of anesthesia    DIFFICULT WAKING   . COPD (chronic obstructive pulmonary disease) Gold C Frequent exacerbations 01/13/2014   [name] 07/08/14: FeV1 51% FeV1/FVC 66% FVC 59%  10/5/2015ONO RA was normal  10/13/2014  ONO on RA NORMAL   . COPD, frequent exacerbations ([place])    pulmologist-  dr Joya Gaskins--  Girth[place] Rm Stage C.04-25-15 recent COPD exacerbation-much improved now, after tx. in ER [facility].  Marland Kitchen Depression   . Diabetes mellitus without complication ([place])    BODERLINE - DIET CONTROL  . Dysrhythmia    PVC'S  . Emphysema lung ([place])    stage 2  . Family history of adverse reaction to anesthesia    father would wake up with agitation   . Former smoker 01/13/2014  . Gastroesophageal reflux disease without esophagitis   . Heavy alcohol use 04/30/2016  . History of chronic bronchitis   . History of oxygen administration    oxygen use 2 l/m nasally at bedtime and exertional occasions  . History of rheumatic fever   . History of TB (tuberculosis)    1984--  hospitalized for 4 month treatment  . History of urinary retention   . Hx of multiple concussions    x 2 per patient   . Hypertension   . Melanoma ([place])   . Nocturnal oxygen desaturation    USES O2 NIGHTLY  . OSA (obstructive sleep apnea) 01/13/2014  . PONV (postoperative nausea and vomiting)   . Post-polio syndrome    polio at age 58--PT WAS IN IRON LUNG; PT WAS IN W/C UNTIL AGE 63; STILL HAS WEAKNESS RIGHT SIDE  . Prostate cancer ([place])   . Schizophrenia ([place])   . Tuberculosis  Hosp 4 months rx , left early   . Urticaria     Patient Active Problem List   Diagnosis Date Noted  . Severe persistent asthma without complication 16/96/7893  . Bee sting-induced anaphylaxis 02/12/2017  . Anaphylactic reaction due to food, initial encounter 02/12/2017  . Current use of beta blocker 02/12/2017  . Allergic rhinitis 01/15/2017  . COPD exacerbation ([place]) 11/04/2016  . Asthma exacerbation 11/03/2016  . Nausea without vomiting 09/17/2016  . Shortness of breath   . Diabetes mellitus type 2 in obese ([place]) 06/26/2016  . non-specific Chest pain 06/12/2016  . Patient's noncompliance with other medical treatment and  regimen 06/02/2016  . Hyperlipidemia, mixed 06/02/2016  . Elevated liver enzymes  06/02/2016  . Schizophrenia ([place]) 04/30/2016  . Environmental and seasonal allergies 04/30/2016  . Diabetes mellitus without complication- (diet controlled; w/o proteinuria) 04/30/2016  . Heavy alcohol use- Many yrs  04/30/2016  . Basal cell carcinoma of back 04/30/2016  . Obesity 04/30/2016  . Hx of multiple concussions 04/30/2016  . h/o Vitamin D deficiency 04/30/2016  . Screen for sexually transmitted diseases 04/30/2016  . Immunocompromised state ([place]) 04/30/2016  . Cervical radiculopathy 01/15/2016  . Alcohol withdrawal ([place]) 11/22/2015  . h/o Suicidal thoughts 11/21/2015  . Dry mouth 10/07/2015  . Anxiety 09/19/2015  . OAB (overactive bladder) 07/27/2015  . Chronic idiopathic constipation 05/08/2015  . Screening for colon cancer   . Benign neoplasm of cecum   . Benign neoplasm of ascending colon   . Benign neoplasm of transverse colon   . Benign neoplasm of sigmoid colon   . Rectal polyp   . Gastroesophageal reflux disease without esophagitis   . Esophageal stricture   . Joint stiffness of multiple sites 10/17/2014  . Benign localized hyperplasia of prostate with urinary obstruction 09/29/2014  . concerns for memory loss 09/13/2014  . Tuberculosis   . Diverticulum of bladder 06/17/2014  . Neuropathy ([place]) 04/11/2014  . Post-polio syndrome 01/13/2014  . Other emphysema ([place]) 01/13/2014  . Hypertension 01/13/2014  . Former smoker 01/13/2014  . h/o Prostate cancer 01/13/2014  . History of tuberculosis 01/13/2014  . Bipolar disorder ([place]) 01/13/2014  . Explosive personality disorder 01/13/2014  . Erectile dysfunction 01/13/2014  . Nephrolithiasis 01/13/2014  . Chronic pain 01/13/2014  . Alcoholism ([place]) 01/13/2014  . OSA (obstructive sleep apnea) 01/13/2014    Past Surgical History:  Procedure Laterality Date  . CARDIOVASCULAR STRESS TEST  06-08-2014  dr Mare Ferrari   normal lexiscan  study/  no ischemia/  not gated due to PAC's  . COLONOSCOPY N/A 05/03/2015   Procedure: COLONOSCOPY;  Surgeon: [name], MD;  Location: [facility];  Service: Endoscopy;  Laterality: N/A;  . CYSTOSCOPY N/A 10/25/2015   Procedure: CYSTOSCOPY;  Surgeon: [name], MD;  Location: [facility];  Service: Urology;  Laterality: N/A;  . CYSTOSCOPY W/ CYSTOGRAM/  TRANSRECTAL ULTRASOUND PROSTATE BX  03-22-2009  . ESOPHAGOGASTRODUODENOSCOPY N/A 03/22/2015   Procedure: ESOPHAGOGASTRODUODENOSCOPY (EGD) with dilation;  Surgeon: [name], MD;  Location: [facility];  Service: Endoscopy;  Laterality: N/A;  . excision of skin lesion    . LAPAROSCOPIC CHOLECYSTECTOMY  2013  . left elbow surgery      due to fracture   . NASAL SEPTUM SURGERY  2000  . OTHER SURGICAL HISTORY      Muscle & bone Graft/Polio  . polio surgeries      14 polio surgeries   . PROSTATE BIOPSY N/A 09/28/2014   Procedure: PROSTATE ULTRASOUND/BIOPSY;  Surgeon:  [name], MD;  Location: [facility];  Service: Urology;  Laterality: N/A;  . PROSTATE BIOPSY N/A 10/25/2015   Procedure: PROSTATE BIOPSY AND ULTRASOUND;  Surgeon: [name], MD;  Location: [facility];  Service: Urology;  Laterality: N/A;  . SAVORY DILATION N/A 03/22/2015   Procedure: SAVORY DILATION;  Surgeon: [name], MD;  Location: [facility];  Service: Endoscopy;  Laterality: N/A;  . SHOULDER ARTHROSCOPY WITH OPEN ROTATOR CUFF REPAIR Bilateral 2013  &  1999   removal spurs and bursectomy  . TRANSURETHRAL INCISION OF BLADDER NECK N/A 10/25/2015   Procedure:  TRANSURETHRAL INCISION OF BLADDER NECK;  Surgeon: [name], MD;  Location: [facility];  Service: Urology;  Laterality: N/A;  . TRANSURETHRAL RESECTION OF PROSTATE N/A 09/28/2014   Procedure: TRANSURETHRAL RESECTION OF THE PROSTATE (TURP);  Surgeon: [name], MD;  Location: [facility];  Service: Urology;  Laterality: N/A;  . URETEROSOPY STONE EXTRACTION  2000      Home Medications    Prior to Admission medications     Medication Sig Start Date End Date Taking? Authorizing Provider  acetaminophen (TYLENOL) 500 MG tablet Take 500 mg by mouth every 6 (six) hours as needed for headache (pain).    [provider]  albuterol (PROVENTIL) (2.5 MG/3ML) 0.083% nebulizer solution Take 2.5 mg by nebulization every 6 (six) hours as needed for wheezing or shortness of breath.    [provider]  albuterol (VENTOLIN HFA) 108 (90 Base) MCG/ACT inhaler Inhale 2 puffs into the lungs every 4 (four) hours as needed for wheezing or shortness of breath.    [provider]  ALPRAZolam [name]) 1 MG tablet Take 0.5 tablets (0.5 mg total) by mouth 2 (two) times daily as needed for anxiety. Patient taking differently: Take 1 mg by mouth 4 (four) times daily.  10/29/16   Shelda Pal, DO  arformoterol (BROVANA) 15 MCG/2ML NEBU Take 2 mLs (15 mcg total) by nebulization 2 (two) times daily. 10/24/16   Rigoberto Noel, MD  baclofen (LIORESAL) 10 MG tablet Take 10 mg by mouth at bedtime.    [provider]  bisoprolol (ZEBETA) 5 MG tablet Take 1 tablet (5 mg total) by mouth daily. 01/01/17   Shelda Pal, DO  budesonide (PULMICORT) 0.25 MG/2ML nebulizer solution Take 2 mLs (0.25 mg total) by nebulization 2 (two) times daily. 09/18/16   Parrett, Fonnie Mu, NP  dexlansoprazole (DEXILANT) 60 MG capsule Take 1 capsule (60 mg total) by mouth daily. Patient taking differently: Take 60 mg by mouth every other day.  09/17/16   [place], Betty G, MD  doxycycline (VIBRAMYCIN) 100 MG capsule  12/24/16   [provider]  EPINEPHrine 0.3 mg/0.3 mL IJ SOAJ injection Inject 0.3 mLs (0.3 mg total) into the muscle once. Patient taking differently: Inject 0.3 mg into the muscle once as needed (severe allergic reactions).  02/24/16   Malvin Johns, MD  HYDROcodone-acetaminophen (NORCO/VICODIN) 5-325 MG tablet Take 1 tablet by mouth every 6 (six) hours as needed for moderate pain.    [provider]   loratadine (CLARITIN) 10 MG tablet Take 10 mg by mouth daily.    [provider]  nitroGLYCERIN (NITROSTAT) 0.4 MG SL tablet Place 1 tablet (0.4 mg total) under the tongue every 5 (five) minutes as needed for chest pain. 06/21/15   Brunetta Jeans, PA-C  ondansetron (ZOFRAN) 4 MG tablet Take 1 tablet (4 mg total) by mouth every 12 (twelve) hours as needed for nausea  or vomiting. 09/17/16   [place], Betty G, MD  OXYGEN Inhale 2 L into the lungs at bedtime. Reported on 11/25/2015    [provider]  predniSONE (DELTASONE) 10 MG tablet Take 1 tablet (10 mg total) by mouth daily with breakfast. Patient taking differently: Take 10 mg by mouth 2 (two) times daily.  11/14/16   Rigoberto Noel, MD  triamcinolone cream (KENALOG) 0.1 % Apply twice a day if needed to red itchy areas below the face to insect bites. 02/12/17   Charlies Silvers, MD    Family History Family History  Problem Relation Age of Onset  . Alzheimer's disease Father 4       Deceased  . Stomach cancer Father   . Heart attack Father   . Heart disease Father   . Skin cancer Mother        Facial-Living  . Cancer Mother        skin  . Alcohol abuse Sister        x2  . Mental illness Sister        x2  . Alcohol abuse Sister   . Diabetes Maternal Aunt        x2  . Thyroid disease Maternal Aunt        x4  . Diabetes Maternal Uncle   . Tuberculosis Paternal Grandfather   . Tuberculosis Paternal Grandmother   . Alzheimer's disease Paternal Aunt   . Alzheimer's disease Paternal Uncle   . Colon cancer Neg Hx   . Colon polyps Neg Hx   . Crohn's disease Neg Hx   . Ulcerative colitis Neg Hx   . Allergic rhinitis Neg Hx   . Angioedema Neg Hx   . Asthma Neg Hx   . Eczema Neg Hx   . Urticaria Neg Hx     Social History Social History  Substance Use Topics  . Smoking status: Former Smoker    Years: 47.00    Types: Pipe    Quit date: 08/25/2013  . Smokeless tobacco: Former Systems developer     Comment: 5-6 times smoking a  pipe  . Alcohol use 8.4 oz/week    14 Cans of beer per week     Comment: 2 cans daily     Allergies   Aspirin; Hornet venom; Ivp dye [iodinated diagnostic agents]; Levaquin [levofloxacin in d5w]; Nsaids; Penicillins; Tolmetin; Perforomist [formoterol]; Buprenorphine hcl; Morphine and related; and Oxycodone   Review of Systems Review of Systems All systems reviewed and are negative for acute change except as noted in the HPI.  Physical Exam Updated Vital Signs BP 108/66   Pulse 70   Temp 98.6 F (37 C) (Oral)   Resp 20   Ht 6' (1.829 m)   Wt 220 lb (99.8 kg)   SpO2 98%   BMI 29.84 kg/m   Physical Exam  Constitutional: He is oriented to person, place, and time. He appears well-developed and well-nourished. No distress.  HENT:  Head: Normocephalic and atraumatic.  Eyes: Conjunctivae are normal.  Cardiovascular: Normal rate, regular rhythm and normal heart sounds.   No murmur heard. Pulmonary/Chest: Effort normal. No tachypnea. He has decreased breath sounds. He has no wheezes.  Diffusely decreased breath sounds, scant expiratory wheezes throughout. Pt is able to speak in complete sentences.   Abdominal: He exhibits no distension.  Musculoskeletal: He exhibits no edema.  No lower extremity edema.   Neurological: He is alert and oriented to person, place, and time.  Skin: Skin is  warm and dry.  Nursing note and vitals reviewed.  ED Treatments / Results  DIAGNOSTIC STUDIES:  Oxygen Saturation is 99% on RA, normal by my interpretation.    COORDINATION OF CARE:  3:26 PM Discussed treatment plan which includes prednisone and breathing tx with pt at bedside and pt agreed to plan.   Labs (all labs ordered are listed, but only abnormal results are displayed) Labs Reviewed - No data to display  EKG  EKG Interpretation  Date/Time:  Saturday Mar 09 2017 14:50:50 EDT Ventricular Rate:  67 PR Interval:  168 QRS Duration: 90 QT Interval:  402 QTC Calculation: 424 R  Axis:   80 Text Interpretation:  Normal sinus rhythm Possible Anterior infarct , age undetermined No significant change since last tracing Reconfirmed by Blanchie Dessert [phone]) on 03/09/2017 4:13:24 PM       Radiology Dg Chest 2 View  Result Date: 03/09/2017 CLINICAL DATA:  Shortness of breath and dizziness EXAM: CHEST  2 VIEW COMPARISON:  01/04/2017 FINDINGS: The heart size and mediastinal contours are within normal limits. Both lungs are clear. The visualized skeletal structures are unremarkable. IMPRESSION: No active cardiopulmonary disease. Electronically Signed   By: Inez Catalina M.D.   On: 03/09/2017 15:22    Procedures Procedures (including critical care time)  Medications Ordered in ED Medications  predniSONE (DELTASONE) 10 MG tablet (not administered)  ipratropium-albuterol (DUONEB) 0.5-2.5 (3) MG/3ML nebulizer solution 3 mL (3 mLs Nebulization Given 03/09/17 1502)  albuterol (PROVENTIL) (2.5 MG/3ML) 0.083% nebulizer solution 2.5 mg (2.5 mg Nebulization Given 03/09/17 1502)  albuterol (PROVENTIL) (2.5 MG/3ML) 0.083% nebulizer solution 5 mg (5 mg Nebulization Given 03/09/17 1548)  ipratropium (ATROVENT) nebulizer solution 0.5 mg (0.5 mg Nebulization Given 03/09/17 1548)  predniSONE (DELTASONE) tablet 60 mg (60 mg Oral Given 03/09/17 1557)     Initial Impression / Assessment and Plan / ED Course  I have reviewed the triage vital signs and the nursing notes.  Pertinent labs & imaging results that were available during my care of the patient were reviewed by me and considered in my medical decision making (see chart for details).     Patient is a 66 year old male with multiple medical problems presenting today with shortness of breath, mostly nonproductive cough, chest tightness, wheezing in the setting of COPD. He has been taking all the medications as prescribed which include an inhaled steroid, 10 mg of prednisone daily and he started doxycycline 2 days ago without improvement  of his symptoms. Patient states that he came in early this time and usually his breathing gets much worse before he comes in. Today he is wheezing minimally with decreased air movement. Oxygen saturation is 98% the knees in no acute distress. Chest x-ray without acute findings. EKG without acute findings. Feel that this is most likely a COPD exacerbation. Patient treated with albuterol, Atrovent and a steroid burst. Will reevaluate   5:03 PM Patient symptoms are improved after albuterol and Atrovent. Wheezing significantly improved. Patient did have one episode of bradycardia. He states he coughed and then suddenly started to feel a sinking feeling. He was noted to have bradycardia on the monitor which quickly resolved. Repeat EKG here shows a sinus rhythm with occasional PACs and when watching the monitor also has occasional PVCs. This may be from recent albuterol. Patient states that shortness of breath is improved. Will ambulate the patient but feel that he is safe for discharge at this time. He will increase his prednisone to 40 mg per day and continue  his current medications.  Final Clinical Impressions(s) / ED Diagnoses   Final diagnoses:  COPD exacerbation ([facility])    New Prescriptions New Prescriptions   DOXYCYCLINE (VIBRAMYCIN) 100 MG CAPSULE    Take 1 capsule (100 mg total) by mouth 2 (two) times daily.   I personally performed the services described in this documentation, which was scribed in my presence.  The recorded information has been reviewed and considered.     Blanchie Dessert, MD 03/09/17 1736

## 2017-03-09 NOTE — Discharge Instructions (Signed)
Increase your prednisone to 40mg  for the next 5 days and then decrease to 30 for 3 days then 20 for 3 days then back to your baseline of 10.  Continue taking your doxycycline for 1 week

## 2017-03-09 NOTE — ED Triage Notes (Signed)
Pt reports COPD is 'flaring' up.  Reports CP, SOB.  Speaking in complete sentences, no distress noted.

## 2017-03-09 NOTE — ED Notes (Signed)
Pt reports having occasional similar episodes at home.

## 2017-03-12 ENCOUNTER — Telehealth: Payer: Self-pay | Admitting: Pulmonary Disease

## 2017-03-12 ENCOUNTER — Encounter (HOSPITAL_COMMUNITY)
Admission: RE | Admit: 2017-03-12 | Discharge: 2017-03-12 | Disposition: A | Discharge status: Home / Self Care | Payer: Medicare Other | Source: Ambulatory Visit | Attending: Pulmonary Disease | Admitting: Pulmonary Disease

## 2017-03-12 VITALS — Wt 218.5 lb

## 2017-03-12 DIAGNOSIS — J449 Chronic obstructive pulmonary disease, unspecified: Secondary | ICD-10-CM | POA: Diagnosis not present

## 2017-03-12 DIAGNOSIS — J439 Emphysema, unspecified: Secondary | ICD-10-CM

## 2017-03-12 NOTE — Progress Notes (Signed)
Daily Session Note  Patient Details  Name: [name] MRN: [id number] Date of Birth: 06-28-1951 Referring Provider:     Pulmonary Rehab Walk Test from 02/28/2017 in [place]  Referring Provider  Dr. Elsworth Soho      Encounter Date: 03/12/2017  Check In:     Session Check In - 03/12/17 1328      Check-In   Location [facility]   Staff Present Trish Fountain, RN, Maxcine Ham, RN, BSN;Molly diVincenzo, MS, ACSM RCEP, Exercise Physiologist   Supervising physician immediately available to respond to emergencies [facility] immediately available   Physician(s) Dr. Karleen Hampshire   Medication changes reported     No   Fall or balance concerns reported    No   Tobacco Cessation No Change   Warm-up and Cool-down Performed as group-led instruction   Resistance Training Performed Yes   VAD Patient? No     Pain Assessment   Currently in Pain? No/denies   Multiple Pain Sites No      Capillary Blood Glucose: No results found for this or any previous visit (from the past 24 hour(s)).      Exercise Prescription Changes - 03/12/17 1556      Response to Exercise   Blood Pressure (Admit) 136/60   Blood Pressure (Exercise) 130/70   Blood Pressure (Exit) 106/60   Heart Rate (Admit) 70 bpm   Heart Rate (Exercise) 84 bpm   Heart Rate (Exit) 65 bpm   Oxygen Saturation (Admit) 97 %   Oxygen Saturation (Exercise) 96 %   Oxygen Saturation (Exit) 99 %   Rating of Perceived Exertion (Exercise) 13   Perceived Dyspnea (Exercise) 2   Duration Progress to 45 minutes of aerobic exercise without signs/symptoms of physical distress   Intensity THRR unchanged     Progression   Progression Continue to progress workloads to maintain intensity without signs/symptoms of physical distress.     Resistance Training   Training Prescription Yes   Weight blue bands   Reps 10-15   Time 10 Minutes     Bike   Level 0.5   Minutes 17     NuStep   Level 2   Minutes 17   METs 1.7     Track   Laps 18   Minutes 17      History  Smoking Status  . Former Smoker  . Years: 47.00  . Types: Pipe  . Quit date: 08/25/2013  Smokeless Tobacco  . Former Systems developer    Comment: 5-6 times smoking a pipe    Goals Met:  Exercise tolerated well Queuing for purse lip breathing No report of cardiac concerns or symptoms Strength training completed today  Goals Unmet:  Not Applicable  Comments: Service time is from 1330 to 1510   Dr. Rush Farmer is Medical Director for Pulmonary Rehab at [facility].

## 2017-03-12 NOTE — Telephone Encounter (Signed)
lmtcb X1 for pt to make aware of recs.   

## 2017-03-12 NOTE — Telephone Encounter (Signed)
Per TP: According to our records, he is already taking prednisone daily 10mg .  If he is unable to tolerate the 40mg  prednisone as recommended by the ED then okay to decrease back down to the 10mg  daily.  If symptoms do not improve or they worsen, pt needs to contact the office for ov or seek emergency attention.  Thank you.

## 2017-03-12 NOTE — Telephone Encounter (Signed)
Called and spoke with pt and he stated that he was seen at [facility] center on Saturday.  He stated that he was given pred on Saturday and his BP and HR  dropped at the hospital. He was given the pred taper---and the doxy and he stated that every time he takes the prednisone he does not feel right.  He is worried about taking the prednisone.  RA is out of the office today.  TP please advise. Thanks   Allergies  Allergen Reactions  . Aspirin Anaphylaxis  . Hornet Venom Anaphylaxis  . Ivp Dye [Iodinated Diagnostic Agents] Anaphylaxis  . Levaquin [Levofloxacin In D5w] Shortness Of Breath and Swelling    In addition: sweating, chest pain, and diarrhea.   . Nsaids Anaphylaxis  . Penicillins Anaphylaxis    Heart stops Has patient had a PCN reaction causing immediate rash, facial/tongue/throat swelling, SOB or lightheadedness with hypotension: yes Has patient had a PCN reaction causing severe rash involving mucus membranes or skin necrosis: yes Has patient had a PCN reaction that required hospitalization yes Has patient had a PCN reaction occurring within the last 10 years: No If all of the above answers are "NO", then may proceed with Cephalosporin use.   . Tolmetin Anaphylaxis  . Perforomist [Formoterol]     Increased wheezing, shortness of breath  . Buprenorphine Hcl Nausea And Vomiting    Can take with zofran   . Morphine And Related Nausea And Vomiting    Can take with zofran   . Oxycodone Itching and Rash

## 2017-03-13 ENCOUNTER — Telehealth: Payer: Self-pay | Admitting: Pulmonary Disease

## 2017-03-13 NOTE — Telephone Encounter (Signed)
Left message to call back  

## 2017-03-13 NOTE — Telephone Encounter (Signed)
Pt called back and he is aware of TP recs to take the prednisone 10 mg daily.  He is aware that if he has any further symptoms to give [name] a call back for an appt or he can be seen in the ER is emergent.

## 2017-03-13 NOTE — Telephone Encounter (Signed)
Pt returning call and can be reached @ [phone].Hillery Hunter

## 2017-03-13 NOTE — Telephone Encounter (Signed)
I called [employer] [facility]st to see if they were able to reach the pt., no. I called Mr. Capizzi and lm asking him to call me or [employer] back, giving one of [name] this info.. Will wait for pt. To call back.

## 2017-03-14 ENCOUNTER — Encounter (HOSPITAL_COMMUNITY)
Admission: RE | Admit: 2017-03-14 | Discharge: 2017-03-14 | Disposition: A | Discharge status: Home / Self Care | Payer: Medicare Other | Source: Ambulatory Visit | Attending: Pulmonary Disease | Admitting: Pulmonary Disease

## 2017-03-14 VITALS — Wt 218.5 lb

## 2017-03-14 DIAGNOSIS — J449 Chronic obstructive pulmonary disease, unspecified: Secondary | ICD-10-CM | POA: Diagnosis not present

## 2017-03-14 DIAGNOSIS — J439 Emphysema, unspecified: Secondary | ICD-10-CM

## 2017-03-14 NOTE — Progress Notes (Signed)
Daily Session Note  Patient Details  Name: [name] MRN: [id number] Date of Birth: 10/27/1950 Referring Provider:     Pulmonary Rehab Walk Test from 02/28/2017 in [facility]  Referring Provider  Dr. Alva      Encounter Date: 03/14/2017  Check In:     Session Check In - 03/14/17 1349      Check-In   Location [facility]   Staff Present Portia Payne, RN, BSN; , MS, ACSM RCEP, Exercise Physiologist   Supervising physician immediately available to respond to emergencies [facility] immediately available   Physician(s) Dr. Joseph   Medication changes reported     No   Fall or balance concerns reported    No   Tobacco Cessation No Change   Warm-up and Cool-down Performed as group-led instruction   Resistance Training Performed Yes   VAD Patient? No     Pain Assessment   Currently in Pain? No/denies   Multiple Pain Sites No      Capillary Blood Glucose: No results found for this or any previous visit (from the past 24 hour(s)).      Exercise Prescription Changes - 03/14/17 1600      Response to Exercise   Blood Pressure (Admit) 114/66   Blood Pressure (Exercise) 132/88   Blood Pressure (Exit) 118/72   Heart Rate (Admit) 62 bpm   Heart Rate (Exercise) 72 bpm   Heart Rate (Exit) 58 bpm   Oxygen Saturation (Admit) 98 %   Oxygen Saturation (Exercise) 95 %   Oxygen Saturation (Exit) 97 %   Rating of Perceived Exertion (Exercise) 12   Perceived Dyspnea (Exercise) 2   Duration Progress to 45 minutes of aerobic exercise without signs/symptoms of physical distress   Intensity THRR unchanged     Progression   Progression Continue to progress workloads to maintain intensity without signs/symptoms of physical distress.     Resistance Training   Training Prescription Yes   Weight blue bands   Reps 10-15   Time 10 Minutes     Bike   Level 0.5   Minutes 17     Track   Laps 9   Minutes 17       History  Smoking Status  . Former Smoker  . Years: 47.00  . Types: Pipe  . Quit date: 08/25/2013  Smokeless Tobacco  . Former User    Comment: 5-6 times smoking a pipe    Goals Met:  Exercise tolerated well No report of cardiac concerns or symptoms Strength training completed today  Goals Unmet:  Not Applicable  Comments: Service time is from 1:30p to 3:10p    Dr. Wesam G. Yacoub is Medical Director for Pulmonary Rehab at [facility]. 

## 2017-03-18 NOTE — Telephone Encounter (Signed)
I was able to get in touch with [name] today. I gave him [employer] # again and asked him to call them. He says he does still have a secondary ins., they take it out of his check every month on the 3rd. Will wait to hear from [employer].

## 2017-03-19 ENCOUNTER — Encounter (HOSPITAL_COMMUNITY)
Admission: RE | Admit: 2017-03-19 | Discharge: 2017-03-19 | Disposition: A | Discharge status: Home / Self Care | Payer: Medicare Other | Source: Ambulatory Visit | Attending: Pulmonary Disease | Admitting: Pulmonary Disease

## 2017-03-19 DIAGNOSIS — J449 Chronic obstructive pulmonary disease, unspecified: Secondary | ICD-10-CM | POA: Insufficient documentation

## 2017-03-20 ENCOUNTER — Ambulatory Visit: Payer: Self-pay | Admitting: Adult Health

## 2017-03-21 ENCOUNTER — Encounter (HOSPITAL_COMMUNITY)
Admission: RE | Admit: 2017-03-21 | Discharge: 2017-03-21 | Disposition: A | Discharge status: Home / Self Care | Payer: Medicare Other | Source: Ambulatory Visit | Attending: Pulmonary Disease | Admitting: Pulmonary Disease

## 2017-03-21 DIAGNOSIS — J439 Emphysema, unspecified: Secondary | ICD-10-CM

## 2017-03-21 NOTE — Progress Notes (Signed)
Pulmonary Individual Treatment Plan  Patient Details  Name: [name] MRN: [id number] Date of Birth: 10/08/1951 Referring Provider:     Pulmonary Rehab Walk Test from 02/28/2017 in [place]  Referring Provider  Dr. Elsworth Soho      Initial Encounter Date:    Pulmonary Rehab Walk Test from 02/28/2017 in [place]  Date  02/28/17  Referring Provider  Dr. Elsworth Soho      Visit Diagnosis: Pulmonary emphysema, unspecified emphysema type ([place])  Patient's Home Medications on Admission:   Current Outpatient Prescriptions:  .  acetaminophen (TYLENOL) 500 MG tablet, Take 500 mg by mouth every 6 (six) hours as needed for headache (pain)., Disp: , Rfl:  .  albuterol (PROVENTIL) (2.5 MG/3ML) 0.083% nebulizer solution, Take 2.5 mg by nebulization every 6 (six) hours as needed for wheezing or shortness of breath., Disp: , Rfl:  .  albuterol (VENTOLIN HFA) 108 (90 Base) MCG/ACT inhaler, Inhale 2 puffs into the lungs every 4 (four) hours as needed for wheezing or shortness of breath., Disp: , Rfl:  .  ALPRAZolam (XANAX) 1 MG tablet, Take 0.5 tablets (0.5 mg total) by mouth 2 (two) times daily as needed for anxiety. (Patient taking differently: Take 1 mg by mouth 4 (four) times daily. ), Disp: 60 tablet, Rfl: 0 .  arformoterol (BROVANA) 15 MCG/2ML NEBU, Take 2 mLs (15 mcg total) by nebulization 2 (two) times daily., Disp: 120 mL, Rfl: 6 .  baclofen (LIORESAL) 10 MG tablet, Take 10 mg by mouth at bedtime., Disp: , Rfl:  .  bisoprolol (ZEBETA) 5 MG tablet, Take 1 tablet (5 mg total) by mouth daily., Disp: 30 tablet, Rfl: 1 .  budesonide (PULMICORT) 0.25 MG/2ML nebulizer solution, Take 2 mLs (0.25 mg total) by nebulization 2 (two) times daily., Disp: 120 mL, Rfl: 6 .  dexlansoprazole (DEXILANT) 60 MG capsule, Take 1 capsule (60 mg total) by mouth daily. (Patient taking differently: Take 60 mg by mouth every other day. ), Disp: 90 capsule, Rfl: 1 .   doxycycline (VIBRAMYCIN) 100 MG capsule, , Disp: , Rfl:  .  doxycycline (VIBRAMYCIN) 100 MG capsule, Take 1 capsule (100 mg total) by mouth 2 (two) times daily., Disp: 14 capsule, Rfl: 0 .  EPINEPHrine 0.3 mg/0.3 mL IJ SOAJ injection, Inject 0.3 mLs (0.3 mg total) into the muscle once. (Patient taking differently: Inject 0.3 mg into the muscle once as needed (severe allergic reactions). ), Disp: 1 Device, Rfl: 1 .  HYDROcodone-acetaminophen (NORCO/VICODIN) 5-325 MG tablet, Take 1 tablet by mouth every 6 (six) hours as needed for moderate pain., Disp: , Rfl:  .  loratadine (CLARITIN) 10 MG tablet, Take 10 mg by mouth daily., Disp: , Rfl:  .  nitroGLYCERIN (NITROSTAT) 0.4 MG SL tablet, Place 1 tablet (0.4 mg total) under the tongue every 5 (five) minutes as needed for chest pain., Disp: 30 tablet, Rfl: 3 .  ondansetron (ZOFRAN) 4 MG tablet, Take 1 tablet (4 mg total) by mouth every 12 (twelve) hours as needed for nausea or vomiting., Disp: 30 tablet, Rfl: 2 .  OXYGEN, Inhale 2 L into the lungs at bedtime. Reported on 11/25/2015, Disp: , Rfl:  .  predniSONE (DELTASONE) 10 MG tablet, Take 1 tablet (10 mg total) by mouth daily with breakfast. (Patient taking differently: Take 10 mg by mouth 2 (two) times daily. ), Disp: 30 tablet, Rfl: 0 .  triamcinolone cream (KENALOG) 0.1 %, Apply twice a day if needed to red  itchy areas below the face to insect bites., Disp: 45 g, Rfl: 3  Past Medical History: Past Medical History:  Diagnosis Date  . Anxiety disorder   . Arthritis    BILATERAL SHOULDERS, ELBOWS AND HANDS AND LEFT HIP AND KNEES--HX CORTISONE SHOTS IN SHOULDERS, ELBOWS, HIP AND KNEES  . Basal cell carcinoma of back 04/30/2016   Dermatologist- Dr Nevada Crane;   MOHS sx- Dr Levada Dy   . Bladder outlet obstruction   . Chronic idiopathic constipation 05/08/2015  . Complication of anesthesia    DIFFICULT WAKING   . COPD (chronic obstructive pulmonary disease) Gold C Frequent exacerbations 01/13/2014   [name]  07/08/14: FeV1 51% FeV1/FVC 66% FVC 59% 10/5/2015ONO RA was normal  10/13/2014  ONO on RA NORMAL   . COPD, frequent exacerbations ([place])    pulmologist-  dr Joya Gaskins--  [place] Rm Stage C.04-25-15 recent COPD exacerbation-much improved now, after tx. in ER [facility].  Marland Kitchen Depression   . Diabetes mellitus without complication ([place])    BODERLINE - DIET CONTROL  . Dysrhythmia    PVC'S  . Emphysema lung ([place])    stage 2  . Family history of adverse reaction to anesthesia    father would wake up with agitation   . Former smoker 01/13/2014  . Gastroesophageal reflux disease without esophagitis   . Heavy alcohol use 04/30/2016  . History of chronic bronchitis   . History of oxygen administration    oxygen use 2 l/m nasally at bedtime and exertional occasions  . History of rheumatic fever   . History of TB (tuberculosis)    1984--  hospitalized for 4 month treatment  . History of urinary retention   . Hx of multiple concussions    x 2 per patient   . Hypertension   . Melanoma ([place])   . Nocturnal oxygen desaturation    USES O2 NIGHTLY  . OSA (obstructive sleep apnea) 01/13/2014  . PONV (postoperative nausea and vomiting)   . Post-polio syndrome    polio at age 66--PT WAS IN IRON LUNG; PT WAS IN W/C UNTIL AGE 66; STILL HAS WEAKNESS RIGHT SIDE  . Prostate cancer ([place])   . Schizophrenia ([place])   . Tuberculosis    Hosp 4 months rx , left early   . Urticaria     Tobacco Use: History  Smoking Status  . Former Smoker  . Years: 47.00  . Types: Pipe  . Quit date: 08/25/2013  Smokeless Tobacco  . Former Systems developer    Comment: 5-6 times smoking a pipe    Labs: Recent Chemical engineer    Labs for ITP Cardiac and Pulmonary Rehab Latest Ref Rng & Units 11/22/2015 04/30/2016 05/07/2016 07/17/2016 11/03/2016   Cholestrol 0 - 200 mg/dL - - 241(H) 181 -   LDLCALC <130 mg/dL - - 150(H) - -   LDLDIRECT mg/dL - - - 103.0 -   HDL >39.00 mg/dL - - 59 48.00 -   Trlycerides 0.0 - 149.0 mg/dL - - 162(H) 245.0(H) -    Hemoglobin A1c - 6.8(H) 5.9 - - -   PHART 7.350 - 7.450 - - - - 7.395   PCO2ART 32.0 - 48.0 mmHg - - - - 29.5(L)   HCO3 20.0 - 28.0 mmol/L - - - - 18.1(L)   TCO2 0 - 100 mmol/L - - - - 19   ACIDBASEDEF 0.0 - 2.0 mmol/L - - - - 6.0(H)   O2SAT % - - - - 93.0      Capillary  Blood Glucose: Lab Results  Component Value Date   GLUCAP 259 (H) 11/06/2016   GLUCAP 188 (H) 11/06/2016   GLUCAP 184 (H) 11/05/2016   GLUCAP 177 (H) 11/05/2016   GLUCAP 254 (H) 11/05/2016     ADL UCSD:   Pulmonary Function Assessment:     Pulmonary Function Assessment - 02/11/17 1300      Breath   Bilateral Breath Sounds Clear   Shortness of Breath Yes;Limiting activity      Exercise Target Goals:    Exercise Program Goal: Individual exercise prescription set with THRR, safety & activity barriers. Participant demonstrates ability to understand and report RPE using BORG scale, to self-measure pulse accurately, and to acknowledge the importance of the exercise prescription.  Exercise Prescription Goal: Starting with aerobic activity 30 plus minutes a day, 3 days per week for initial exercise prescription. Provide home exercise prescription and guidelines that participant acknowledges understanding prior to discharge.  Activity Barriers & Risk Stratification:   6 Minute Walk:     6 Minute Walk    Row Name 02/28/17 1601         6 Minute Walk   Phase Initial     Distance 1400 feet     Walk Time 6 minutes     # of Rest Breaks 0     MPH 2.65     METS 3.07     RPE 13     Perceived Dyspnea  3     Symptoms Yes (comment)     Comments "woozy" at the end of walk test, knee pain (normal), stated he felt palpitation x1     Resting HR 66 bpm     Resting BP 118/70     Max Ex. HR 108 bpm     Max Ex. BP 154/80       Interval HR   Baseline HR 66     1 Minute HR 74     2 Minute HR 74     3 Minute HR 103     4 Minute HR 108     5 Minute HR 100     6 Minute HR 97     2 Minute Post HR 76      Interval Heart Rate? Yes       Interval Oxygen   Interval Oxygen? Yes     Baseline Oxygen Saturation % 96 %     Baseline Liters of Oxygen 0 L     1 Minute Oxygen Saturation % 94 %     1 Minute Liters of Oxygen 0 L     2 Minute Oxygen Saturation % 90 %     2 Minute Liters of Oxygen 0 L     3 Minute Oxygen Saturation % 95 %     3 Minute Liters of Oxygen 0 L     4 Minute Oxygen Saturation % 95 %     4 Minute Liters of Oxygen 0 L     5 Minute Oxygen Saturation % 95 %     5 Minute Liters of Oxygen 0 L     6 Minute Oxygen Saturation % 94 %     6 Minute Liters of Oxygen 0 L     2 Minute Post Oxygen Saturation % 96 %     2 Minute Post Liters of Oxygen 0 L        Oxygen Initial Assessment:     Oxygen Initial Assessment - 02/28/17 1603  Initial 6 min Walk   Oxygen Used None     Program Oxygen Prescription   Program Oxygen Prescription None      Oxygen Re-Evaluation:     Oxygen Re-Evaluation    Row Name 03/21/17 0915             Program Oxygen Prescription   Program Oxygen Prescription None         Home Oxygen   Home Oxygen Device Home Concentrator;E-Tanks       Sleep Oxygen Prescription Continuous       Liters per minute 2       Home Exercise Oxygen Prescription None       Home at Rest Exercise Oxygen Prescription None       Compliance with Home Oxygen Use Yes         Goals/Expected Outcomes   Short Term Goals To learn and exhibit compliance with exercise, home and travel O2 prescription       Long  Term Goals Exhibits compliance with exercise, home and travel O2 prescription          Oxygen Discharge (Final Oxygen Re-Evaluation):     Oxygen Re-Evaluation - 03/21/17 0915      Program Oxygen Prescription   Program Oxygen Prescription None     Home Oxygen   Home Oxygen Device Home Concentrator;E-Tanks   Sleep Oxygen Prescription Continuous   Liters per minute 2   Home Exercise Oxygen Prescription None   Home at Rest Exercise Oxygen Prescription  None   Compliance with Home Oxygen Use Yes     Goals/Expected Outcomes   Short Term Goals To learn and exhibit compliance with exercise, home and travel O2 prescription   Long  Term Goals Exhibits compliance with exercise, home and travel O2 prescription      Initial Exercise Prescription:     Initial Exercise Prescription - 02/28/17 1600      Date of Initial Exercise RX and Referring Provider   Date 02/28/17   Referring Provider Dr. Elsworth Soho     Bike   Level 0.5   Minutes 17     NuStep   Level 2   Minutes 17   METs 1.5     Track   Laps 10   Minutes 17     Prescription Details   Frequency (times per week) 2   Duration Progress to 45 minutes of aerobic exercise without signs/symptoms of physical distress     Intensity   THRR 40-80% of Max Heartrate 61-122   Ratings of Perceived Exertion 11-13   Perceived Dyspnea 0-4     Progression   Progression Continue progressive overload as per policy without signs/symptoms or physical distress.     Resistance Training   Training Prescription Yes   Weight blue bands   Reps 10-15      Perform Capillary Blood Glucose checks as needed.  Exercise Prescription Changes:     Exercise Prescription Changes    Row Name 03/07/17 1500 03/12/17 1556 03/14/17 1600         Response to Exercise   Blood Pressure (Admit) 130/56 136/60 114/66     Blood Pressure (Exercise) 122/70 130/70 132/88     Blood Pressure (Exit) 116/66 106/60 118/72     Heart Rate (Admit) 61 bpm 70 bpm 62 bpm     Heart Rate (Exercise) 83 bpm 84 bpm 72 bpm     Heart Rate (Exit) 70 bpm 65 bpm 58 bpm     Oxygen  Saturation (Admit)  - 97 % 98 %     Oxygen Saturation (Exercise)  - 96 % 95 %     Oxygen Saturation (Exit)  - 99 % 97 %     Rating of Perceived Exertion (Exercise) '11 13 12     ' Perceived Dyspnea (Exercise) '2 2 2     ' Comments pt was oriented to exercise equipment  -  -     Duration Progress to 45 minutes of aerobic exercise without signs/symptoms of  physical distress Progress to 45 minutes of aerobic exercise without signs/symptoms of physical distress Progress to 45 minutes of aerobic exercise without signs/symptoms of physical distress     Intensity THRR unchanged THRR unchanged THRR unchanged       Progression   Progression Continue to progress workloads to maintain intensity without signs/symptoms of physical distress. Continue to progress workloads to maintain intensity without signs/symptoms of physical distress. Continue to progress workloads to maintain intensity without signs/symptoms of physical distress.       Resistance Training   Training Prescription Yes Yes Yes     Weight blue bands blue bands blue bands     Reps 10-15 10-15 10-15     Time 10 Minutes 10 Minutes 10 Minutes       Bike   Level 0.5 0.5 0.5     Minutes '17 17 17       ' NuStep   Level 2 2  -     Minutes 17 17  -     METs 1.5 1.7  -       Track   Laps  - 18 9     Minutes  - 17 17        Exercise Comments:   Exercise Goals and Review:   Exercise Goals Re-Evaluation :     Exercise Goals Re-Evaluation    Row Name 03/18/17 1139             Exercise Goal Re-Evaluation   Exercise Goals Review Increase Physical Activity;Increase Strenth and Stamina       Comments Patient has only attended three exercise sessions. When asked if we could increase his exercise intensity, he said "no" due to his depression on that particular day. Will cont to monitor and progress patient as able.       Expected Outcomes Through exercising at rehab and at home, patient will increase physical strength and stamina and find ADL's easier to perform.           Discharge Exercise Prescription (Final Exercise Prescription Changes):     Exercise Prescription Changes - 03/14/17 1600      Response to Exercise   Blood Pressure (Admit) 114/66   Blood Pressure (Exercise) 132/88   Blood Pressure (Exit) 118/72   Heart Rate (Admit) 62 bpm   Heart Rate (Exercise) 72 bpm    Heart Rate (Exit) 58 bpm   Oxygen Saturation (Admit) 98 %   Oxygen Saturation (Exercise) 95 %   Oxygen Saturation (Exit) 97 %   Rating of Perceived Exertion (Exercise) 12   Perceived Dyspnea (Exercise) 2   Duration Progress to 45 minutes of aerobic exercise without signs/symptoms of physical distress   Intensity THRR unchanged     Progression   Progression Continue to progress workloads to maintain intensity without signs/symptoms of physical distress.     Resistance Training   Training Prescription Yes   Weight blue bands   Reps 10-15   Time 10 Minutes  Bike   Level 0.5   Minutes 17     Track   Laps 9   Minutes 17      Nutrition:  Target Goals: Understanding of nutrition guidelines, daily intake of sodium <1557m, cholesterol <2073m calories 30% from fat and 7% or less from saturated fats, daily to have 5 or more servings of fruits and vegetables.  Biometrics:     Pre Biometrics - 02/11/17 1304      Pre Biometrics   Grip Strength 58 kg       Nutrition Therapy Plan and Nutrition Goals:   Nutrition Discharge: Rate Your Plate Scores:   Nutrition Goals Re-Evaluation:   Nutrition Goals Discharge (Final Nutrition Goals Re-Evaluation):   Psychosocial: Target Goals: Acknowledge presence or absence of significant depression and/or stress, maximize coping skills, provide positive support system. Participant is able to verbalize types and ability to use techniques and skills needed for reducing stress and depression.  Initial Review & Psychosocial Screening:     Initial Psych Review & Screening - 02/11/17 1305      Initial Review   Current issues with None Identified     Family Dynamics   Good Support System? Yes     Barriers   Psychosocial barriers to participate in program There are no identifiable barriers or psychosocial needs.     Screening Interventions   Interventions Encouraged to exercise      Quality of Life Scores:   PHQ-9: Recent  Review Flowsheet Data    [facility] 02/11[facility]2017 11/16/2016 06/28/2016 12/12/2015   Decreased Interest 3 0 0 1   Down, Depressed, Hopeless 3 1 0 1   PHQ - 2 Score 6 1 0 2   Altered sleeping 1 - - 1   Tired, decreased energy 1 - - 1   Change in appetite 0 - - 1   Feeling bad or failure about yourself  0 - - 1   Trouble concentrating 0 - - 1   Moving slowly or fidgety/restless 1 - - 1   Suicidal thoughts 0 - - 1   PHQ-9 Score 9 - - 9   Difficult doing work/chores Somewhat difficult - - Somewhat difficult     Interpretation of Total Score  Total Score Depression Severity:  1-4 = Minimal depression, 5-9 = Mild depression, 10-14 = Moderate depression, 15-19 = Moderately severe depression, 20-27 = Severe depression   Psychosocial Evaluation and Intervention:     Psychosocial Evaluation - 02/11/17 1306      Psychosocial Evaluation & Interventions   Interventions Encouraged to exercise with the program and follow exercise prescription      Psychosocial Re-Evaluation:     Psychosocial Re-Evaluation    Row Name 03/21/17 0918             Psychosocial Re-Evaluation   Current issues with None Identified       Interventions Encouraged to attend Pulmonary Rehabilitation for the exercise       Continue Psychosocial Services  No Follow up required          Psychosocial Discharge (Final Psychosocial Re-Evaluation):     Psychosocial Re-Evaluation - 03/21/17 0918      Psychosocial Re-Evaluation   Current issues with None Identified   Interventions Encouraged to attend Pulmonary Rehabilitation for the exercise   Continue Psychosocial Services  No Follow up required      Education: Education Goals: Education classes will be provided on a weekly basis, covering required topics. Participant will  state understanding/return demonstration of topics presented.  Learning Barriers/Preferences:     Learning Barriers/Preferences - 02/11/17 1259      Learning  Barriers/Preferences   Learning Barriers None   Learning Preferences Verbal Instruction;Individual Instruction      Education Topics: Risk Factor Reduction:  -Group instruction that is supported by a PowerPoint presentation. Instructor discusses the definition of a risk factor, different risk factors for pulmonary disease, and how the heart and lungs work together.     Nutrition for Pulmonary Patient:  -Group instruction provided by PowerPoint slides, verbal discussion, and written materials to support subject matter. The instructor gives an explanation and review of healthy diet recommendations, which includes a discussion on weight management, recommendations for fruit and vegetable consumption, as well as protein, fluid, caffeine, fiber, sodium, sugar, and alcohol. Tips for eating when patients are short of breath are discussed.   Pursed Lip Breathing:  -Group instruction that is supported by demonstration and informational handouts. Instructor discusses the benefits of pursed lip and diaphragmatic breathing and detailed demonstration on how to preform both.     Oxygen Safety:  -Group instruction provided by PowerPoint, verbal discussion, and written material to support subject matter. There is an overview of "What is Oxygen" and "Why do we need it".  Instructor also reviews how to create a safe environment for oxygen use, the importance of using oxygen as prescribed, and the risks of noncompliance. There is a brief discussion on traveling with oxygen and resources the patient may utilize.   Oxygen Equipment:  -Group instruction provided by [facility] Staff utilizing handouts, written materials, and equipment demonstrations.   PULMONARY REHAB OTHER RESPIRATORY from 03/14/2017 in [place]  Date  03/07/17  Instruction Review Code  2- meets goals/outcomes      Signs and Symptoms:  -Group instruction provided by written material and verbal discussion  to support subject matter. Warning signs and symptoms of infection, stroke, and heart attack are reviewed and when to call the physician/911 reinforced. Tips for preventing the spread of infection discussed.   Advanced Directives:  -Group instruction provided by verbal instruction and written material to support subject matter. Instructor reviews Advanced Directive laws and proper instruction for filling out document.   Pulmonary Video:  -Group video education that reviews the importance of medication and oxygen compliance, exercise, good nutrition, pulmonary hygiene, and pursed lip and diaphragmatic breathing for the pulmonary patient.   Exercise for the Pulmonary Patient:  -Group instruction that is supported by a PowerPoint presentation. Instructor discusses benefits of exercise, core components of exercise, frequency, duration, and intensity of an exercise routine, importance of utilizing pulse oximetry during exercise, safety while exercising, and options of places to exercise outside of rehab.     Pulmonary Medications:  -Verbally interactive group education provided by instructor with focus on inhaled medications and proper administration.   Anatomy and Physiology of the Respiratory System and Intimacy:  -Group instruction provided by PowerPoint, verbal discussion, and written material to support subject matter. Instructor reviews respiratory cycle and anatomical components of the respiratory system and their functions. Instructor also reviews differences in obstructive and restrictive respiratory diseases with examples of each. Intimacy, Sex, and Sexuality differences are reviewed with a discussion on how relationships can change when diagnosed with pulmonary disease. Common sexual concerns are reviewed.   MD DAY -A group question and answer session with a medical doctor that allows participants to ask questions that relate to their pulmonary disease state.  OTHER  EDUCATION -Group or individual verbal, written, or video instructions that support the educational goals of the pulmonary rehab program.   Knowledge Questionnaire Score:   Core Components/Risk Factors/Patient Goals at Admission:     Personal Goals and Risk Factors at Admission - 02/11/17 1304      Core Components/Risk Factors/Patient Goals on Admission   Improve shortness of breath with ADL's Yes   Intervention Provide education, individualized exercise plan and daily activity instruction to help decrease symptoms of SOB with activities of daily living.   Expected Outcomes Short Term: Achieves a reduction of symptoms when performing activities of daily living.   Develop more efficient breathing techniques such as purse lipped breathing and diaphragmatic breathing; and practicing self-pacing with activity Yes   Intervention Provide education, demonstration and support about specific breathing techniuqes utilized for more efficient breathing. Include techniques such as pursed lipped breathing, diaphragmatic breathing and self-pacing activity.   Expected Outcomes Short Term: Participant will be able to demonstrate and use breathing techniques as needed throughout daily activities.   Increase knowledge of respiratory medications and ability to use respiratory devices properly  Yes   Intervention Provide education and demonstration as needed of appropriate use of medications, inhalers, and oxygen therapy.   Expected Outcomes Short Term: Achieves understanding of medications use. Understands that oxygen is a medication prescribed by physician. Demonstrates appropriate use of inhaler and oxygen therapy.      Core Components/Risk Factors/Patient Goals Review:      Goals and Risk Factor Review    Row Name 03/21/17 0916             Core Components/Risk Factors/Patient Goals Review   Personal Goals Review Develop more efficient breathing techniques such as purse lipped breathing and  diaphragmatic breathing and practicing self-pacing with activity.;Increase knowledge of respiratory medications and ability to use respiratory devices properly.;Improve shortness of breath with ADL's       Review New to program, has attended 3 exercise sessions, too early to have met core component goals.       Expected Outcomes Should see improved strength, stamina and decrease in SOB with exercise workload increases in the next 30 days          Core Components/Risk Factors/Patient Goals at Discharge (Final Review):      Goals and Risk Factor Review - 03/21/17 0916      Core Components/Risk Factors/Patient Goals Review   Personal Goals Review Develop more efficient breathing techniques such as purse lipped breathing and diaphragmatic breathing and practicing self-pacing with activity.;Increase knowledge of respiratory medications and ability to use respiratory devices properly.;Improve shortness of breath with ADL's   Review New to program, has attended 3 exercise sessions, too early to have met core component goals.   Expected Outcomes Should see improved strength, stamina and decrease in SOB with exercise workload increases in the next 30 days      ITP Comments:   Comments:ITP REVIEW Pt is making expected progress toward pulmonary rehab goals after completing 3 sessions. Recommend continued exercise, life style modification, education, and utilization of breathing techniques to increase stamina and strength and decrease shortness of breath with exertion.

## 2017-03-25 ENCOUNTER — Other Ambulatory Visit: Payer: Self-pay | Admitting: Family Medicine

## 2017-03-25 NOTE — Telephone Encounter (Signed)
Rx approved and sent the pharmacy by e-script.  However only given #30.  The patient needs further evaluation and/or laboratory testing before further refills are given.  Ask patient to make an appointment for this.//AB/CMA

## 2017-03-26 ENCOUNTER — Encounter (HOSPITAL_COMMUNITY): Payer: Medicare Other

## 2017-03-26 ENCOUNTER — Telehealth (HOSPITAL_COMMUNITY): Payer: Self-pay | Admitting: *Deleted

## 2017-03-26 NOTE — Progress Notes (Signed)
Reviewed & agree with plan  

## 2017-03-27 ENCOUNTER — Telehealth: Payer: Self-pay | Admitting: Family Medicine

## 2017-03-27 NOTE — Telephone Encounter (Signed)
Relation to AV:WUJW Call back [phone] Pharmacy: [facility], [place]. [phone] (Phone) [phone] (Fax)      Reason for call:  Patient requesting a 3 month supply bisoprolol (ZEBETA) 5 MG tablet

## 2017-03-27 NOTE — Telephone Encounter (Signed)
Rx filled on (03/25/17).  Spoke with the pt and informed him the Rx was filled, but only #30.  Informed him that he is due an appointment.  Pt verbalized understanding and agreed.  Pt was scheduled an appt.for (04/01/17).//AB/CMA

## 2017-03-28 ENCOUNTER — Emergency Department (HOSPITAL_COMMUNITY)
Admission: EM | Admit: 2017-03-28 | Discharge: 2017-03-28 | Disposition: A | Discharge status: Home / Self Care | Payer: Medicare Other | Attending: Emergency Medicine | Admitting: Emergency Medicine

## 2017-03-28 ENCOUNTER — Encounter (HOSPITAL_COMMUNITY): Payer: Self-pay | Admitting: Emergency Medicine

## 2017-03-28 ENCOUNTER — Encounter (HOSPITAL_COMMUNITY): Payer: Medicare Other

## 2017-03-28 DIAGNOSIS — T63301A Toxic effect of unspecified spider venom, accidental (unintentional), initial encounter: Secondary | ICD-10-CM | POA: Diagnosis not present

## 2017-03-28 DIAGNOSIS — Z8546 Personal history of malignant neoplasm of prostate: Secondary | ICD-10-CM | POA: Diagnosis not present

## 2017-03-28 DIAGNOSIS — Y999 Unspecified external cause status: Secondary | ICD-10-CM | POA: Insufficient documentation

## 2017-03-28 DIAGNOSIS — R197 Diarrhea, unspecified: Secondary | ICD-10-CM | POA: Insufficient documentation

## 2017-03-28 DIAGNOSIS — W57XXXA Bitten or stung by nonvenomous insect and other nonvenomous arthropods, initial encounter: Secondary | ICD-10-CM | POA: Diagnosis not present

## 2017-03-28 DIAGNOSIS — I10 Essential (primary) hypertension: Secondary | ICD-10-CM | POA: Diagnosis not present

## 2017-03-28 DIAGNOSIS — E119 Type 2 diabetes mellitus without complications: Secondary | ICD-10-CM | POA: Diagnosis not present

## 2017-03-28 DIAGNOSIS — Z87891 Personal history of nicotine dependence: Secondary | ICD-10-CM | POA: Insufficient documentation

## 2017-03-28 DIAGNOSIS — R112 Nausea with vomiting, unspecified: Secondary | ICD-10-CM | POA: Diagnosis not present

## 2017-03-28 DIAGNOSIS — J449 Chronic obstructive pulmonary disease, unspecified: Secondary | ICD-10-CM | POA: Diagnosis not present

## 2017-03-28 DIAGNOSIS — Z79899 Other long term (current) drug therapy: Secondary | ICD-10-CM | POA: Insufficient documentation

## 2017-03-28 DIAGNOSIS — Y929 Unspecified place or not applicable: Secondary | ICD-10-CM | POA: Diagnosis not present

## 2017-03-28 DIAGNOSIS — S20361A Insect bite (nonvenomous) of right front wall of thorax, initial encounter: Secondary | ICD-10-CM | POA: Insufficient documentation

## 2017-03-28 DIAGNOSIS — R100 Acute abdomen: Secondary | ICD-10-CM | POA: Diagnosis not present

## 2017-03-28 DIAGNOSIS — S30861A Insect bite (nonvenomous) of abdominal wall, initial encounter: Secondary | ICD-10-CM | POA: Diagnosis not present

## 2017-03-28 DIAGNOSIS — Y939 Activity, unspecified: Secondary | ICD-10-CM | POA: Diagnosis not present

## 2017-03-28 MED ORDER — SODIUM CHLORIDE 0.9 % IV BOLUS (SEPSIS)
1000.0000 mL | Freq: Once | INTRAVENOUS | Status: AC
  Administered 2017-03-28: 1000 mL via INTRAVENOUS

## 2017-03-28 MED ORDER — DOXYCYCLINE HYCLATE 100 MG PO TABS
200.0000 mg | ORAL_TABLET | Freq: Once | ORAL | Status: AC
  Administered 2017-03-28: 200 mg via ORAL
  Filled 2017-03-28: qty 2

## 2017-03-28 MED ORDER — ONDANSETRON 8 MG PO TBDP: 8.0000 mg | ORAL_TABLET | Freq: Three times a day (TID) | ORAL | 0 refills | Status: DC | PRN

## 2017-03-28 MED ORDER — ONDANSETRON 8 MG PO TBDP
8.0000 mg | ORAL_TABLET | Freq: Once | ORAL | Status: AC
  Administered 2017-03-28: 8 mg via ORAL
  Filled 2017-03-28: qty 1

## 2017-03-28 MED ORDER — ONDANSETRON HCL 4 MG/2ML IJ SOLN
4.0000 mg | Freq: Once | INTRAMUSCULAR | Status: AC
  Administered 2017-03-28: 4 mg via INTRAVENOUS
  Filled 2017-03-28: qty 2

## 2017-03-28 NOTE — ED Notes (Signed)
Patient given ginger ale to sip on.

## 2017-03-28 NOTE — ED Notes (Signed)
Patient feel nauseas. Patient has not vomiting. Patient requested more ginger ale. Patient given more ginger ale.

## 2017-03-28 NOTE — ED Triage Notes (Signed)
Patient is complaining of a tick bite right side below breast line, one on the back of left ear, and spider bite right below left buttocks. Patient has had vomiting for the past four hours and diarrhea.

## 2017-03-28 NOTE — ED Provider Notes (Signed)
[place] [facility] DEPT Provider Note: Georgena Spurling, MD, FACEP  CSN: [id number] MRN: [id number] ARRIVAL: 03/28/17 at [place]: WA17/WA17   CHIEF COMPLAINT  Vomiting and Diarrhea   HISTORY OF PRESENT ILLNESS  [name] is a 66 y.o. 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 He is here with nausea, vomiting and diarrhea that began yesterday afternoon. He estimates he has had 5 episodes of emesis and 4 episodes of diarrhea. When the nausea first started he took Zofran but later was unable to hold down when he began vomiting. He felt very hot like he might have a fever. He had some mild abdominal cramping but no significant pain. He states his blood pressure went up and this scared him. It is noted that his blood pressure is normal at the present time.    Past Medical History:  Diagnosis Date  . Anxiety disorder   . Arthritis    BILATERAL SHOULDERS, ELBOWS AND HANDS AND LEFT HIP AND KNEES--HX CORTISONE SHOTS IN SHOULDERS, ELBOWS, HIP AND KNEES  . Basal cell carcinoma of back 04/30/2016   Dermatologist- Dr Nevada Crane;   MOHS sx- Dr Levada Dy   . Bladder outlet obstruction   . Chronic idiopathic constipation 05/08/2015  . Complication of anesthesia    DIFFICULT WAKING   . COPD (chronic obstructive pulmonary disease) Gold C Frequent exacerbations 01/13/2014   [name] 07/08/14: FeV1 51% FeV1/FVC 66% FVC 59% 10/5/2015ONO RA was normal  10/13/2014  ONO on RA NORMAL   . COPD, frequent exacerbations ([place])    pulmologist-  dr Joya Gaskins--  [place] Rm Stage C.04-25-15 recent COPD exacerbation-much improved now, after tx. in ER [facility].  Marland Kitchen Depression   . Diabetes mellitus without complication ([place])    BODERLINE - DIET CONTROL  . Dysrhythmia    PVC'S  . Emphysema lung ([place])    stage 2  . Family history of adverse reaction to anesthesia    father would wake up with agitation   . Former smoker 01/13/2014  . Gastroesophageal reflux disease without esophagitis     . Heavy alcohol use 04/30/2016  . History of chronic bronchitis   . History of oxygen administration    oxygen use 2 l/m nasally at bedtime and exertional occasions  . History of rheumatic fever   . History of TB (tuberculosis)    1984--  hospitalized for 4 month treatment  . History of urinary retention   . Hx of multiple concussions    x 2 per patient   . Hypertension   . Melanoma ([place])   . Nocturnal oxygen desaturation    USES O2 NIGHTLY  . OSA (obstructive sleep apnea) 01/13/2014  . PONV (postoperative nausea and vomiting)   . Post-polio syndrome    polio at age 10--PT WAS IN IRON LUNG; PT WAS IN W/C UNTIL AGE 36; STILL HAS WEAKNESS RIGHT SIDE  . Prostate cancer ([place])   . Schizophrenia ([place])   . Tuberculosis    Hosp 4 months rx , left early   . Urticaria     Past Surgical History:  Procedure Laterality Date  . CARDIOVASCULAR STRESS TEST  06-08-2014  dr Mare Ferrari   normal lexiscan study/  no ischemia/  not gated due to PAC's  . COLONOSCOPY N/A 05/03/2015   Procedure: COLONOSCOPY;  Surgeon: [name], MD;  Location: [facility];  Service: Endoscopy;  Laterality: N/A;  . CYSTOSCOPY N/A 10/25/2015   Procedure: CYSTOSCOPY;  Surgeon: Jenny Reichmann  Jeffie Pollock, MD;  Location: [facility];  Service: Urology;  Laterality: N/A;  . CYSTOSCOPY W/ CYSTOGRAM/  TRANSRECTAL ULTRASOUND PROSTATE BX  03-22-2009  . ESOPHAGOGASTRODUODENOSCOPY N/A 03/22/2015   Procedure: ESOPHAGOGASTRODUODENOSCOPY (EGD) with dilation;  Surgeon: [name], MD;  Location: [facility];  Service: Endoscopy;  Laterality: N/A;  . excision of skin lesion    . LAPAROSCOPIC CHOLECYSTECTOMY  2013  . left elbow surgery      due to fracture   . NASAL SEPTUM SURGERY  2000  . OTHER SURGICAL HISTORY      Muscle & bone Graft/Polio  . polio surgeries      14 polio surgeries   . PROSTATE BIOPSY N/A 09/28/2014   Procedure: PROSTATE ULTRASOUND/BIOPSY;  Surgeon: [name], MD;  Location: [facility];  Service: Urology;  Laterality: N/A;  .  PROSTATE BIOPSY N/A 10/25/2015   Procedure: PROSTATE BIOPSY AND ULTRASOUND;  Surgeon: [name], MD;  Location: [facility];  Service: Urology;  Laterality: N/A;  . SAVORY DILATION N/A 03/22/2015   Procedure: SAVORY DILATION;  Surgeon: [name], MD;  Location: [facility];  Service: Endoscopy;  Laterality: N/A;  . SHOULDER ARTHROSCOPY WITH OPEN ROTATOR CUFF REPAIR Bilateral 2013  &  1999   removal spurs and bursectomy  . TRANSURETHRAL INCISION OF BLADDER NECK N/A 10/25/2015   Procedure:  TRANSURETHRAL INCISION OF BLADDER NECK;  Surgeon: [name], MD;  Location: [facility];  Service: Urology;  Laterality: N/A;  . TRANSURETHRAL RESECTION OF PROSTATE N/A 09/28/2014   Procedure: TRANSURETHRAL RESECTION OF THE PROSTATE (TURP);  Surgeon: [name], MD;  Location: [facility];  Service: Urology;  Laterality: N/A;  . URETEROSOPY STONE EXTRACTION  2000    Family History  Problem Relation Age of Onset  . Alzheimer's disease Father 4       Deceased  . Stomach cancer Father   . Heart attack Father   . Heart disease Father   . Skin cancer Mother        Facial-Living  . Cancer Mother        skin  . Alcohol abuse Sister        x2  . Mental illness Sister        x2  . Alcohol abuse Sister   . Diabetes Maternal Aunt        x2  . Thyroid disease Maternal Aunt        x4  . Diabetes Maternal Uncle   . Tuberculosis Paternal Grandfather   . Tuberculosis Paternal Grandmother   . Alzheimer's disease Paternal Aunt   . Alzheimer's disease Paternal Uncle   . Colon cancer Neg Hx   . Colon polyps Neg Hx   . Crohn's disease Neg Hx   . Ulcerative colitis Neg Hx   . Allergic rhinitis Neg Hx   . Angioedema Neg Hx   . Asthma Neg Hx   . Eczema Neg Hx   . Urticaria Neg Hx     Social History  Substance Use Topics  . Smoking status: Former Smoker    Years: 47.00    Types: Pipe    Quit date: 08/25/2013  . Smokeless tobacco: Former Systems developer     Comment: 5-6 times smoking a pipe  . Alcohol use 8.4 oz/week     14 Cans of beer per week     Comment: 2 cans daily    Prior to Admission medications   Medication Sig Start Date End Date Taking? Authorizing Provider  acetaminophen (TYLENOL)  500 MG tablet Take 500 mg by mouth every 6 (six) hours as needed for headache (pain).   Yes [provider]  albuterol (PROVENTIL) (2.5 MG/3ML) 0.083% nebulizer solution Take 2.5 mg by nebulization every 6 (six) hours as needed for wheezing or shortness of breath.   Yes [provider]  albuterol (VENTOLIN HFA) 108 (90 Base) MCG/ACT inhaler Inhale 2 puffs into the lungs every 4 (four) hours as needed for wheezing or shortness of breath.   Yes [provider]  ALPRAZolam [name]) 1 MG tablet Take 0.5 tablets (0.5 mg total) by mouth 2 (two) times daily as needed for anxiety. Patient taking differently: Take 0.5 mg by mouth 4 (four) times daily.  10/29/16  Yes Shelda Pal, DO  arformoterol (BROVANA) 15 MCG/2ML NEBU Take 2 mLs (15 mcg total) by nebulization 2 (two) times daily. 10/24/16  Yes Rigoberto Noel, MD  baclofen (LIORESAL) 10 MG tablet Take 10 mg by mouth at bedtime.   Yes [provider]  bisoprolol (ZEBETA) 5 MG tablet TAKE 1 TABLET BY MOUTH DAILY 03/25/17  Yes Wendling, Crosby Oyster, DO  budesonide (PULMICORT) 0.25 MG/2ML nebulizer solution Take 2 mLs (0.25 mg total) by nebulization 2 (two) times daily. 09/18/16  Yes Parrett, Tammy S, NP  dexlansoprazole (DEXILANT) 60 MG capsule Take 1 capsule (60 mg total) by mouth daily. Patient taking differently: Take 60 mg by mouth every other day.  09/17/16  Yes Martinique, Betty G, MD  EPINEPHrine 0.3 mg/0.3 mL IJ SOAJ injection Inject 0.3 mLs (0.3 mg total) into the muscle once. Patient taking differently: Inject 0.3 mg into the muscle once as needed (severe allergic reactions).  02/24/16  Yes Malvin Johns, MD  HYDROcodone-acetaminophen (NORCO/VICODIN) 5-325 MG tablet Take 1 tablet by mouth every 6 (six) hours as needed for moderate  pain.   Yes [provider]  loratadine (CLARITIN) 10 MG tablet Take 10 mg by mouth daily.   Yes [provider]  nitroGLYCERIN (NITROSTAT) 0.4 MG SL tablet Place 1 tablet (0.4 mg total) under the tongue every 5 (five) minutes as needed for chest pain. 06/21/15  Yes Brunetta Jeans, PA-C  triamcinolone cream (KENALOG) 0.1 % Apply twice a day if needed to red itchy areas below the face to insect bites. 02/12/17  Yes Bardelas, Jose A, MD  OXYGEN Inhale 2 L into the lungs at bedtime. Reported on 11/25/2015    [provider]    Allergies Aspirin; Hornet venom; Ivp dye [iodinated diagnostic agents]; Levaquin [levofloxacin in d5w]; Nsaids; Penicillins; Tolmetin; Perforomist [formoterol]; Buprenorphine hcl; Morphine and related; and Oxycodone   REVIEW OF SYSTEMS  Negative except as noted here or in the History of Present Illness.   PHYSICAL EXAMINATION  Initial Vital Signs Blood pressure (!) 149/85, pulse 70, temperature 98.2 F (36.8 C), temperature source Oral, resp. rate 18, height 5\' 10"  (1.778 m), weight 98.9 kg (218 lb), SpO2 97 %.  Examination General: Well-developed, well-nourished male in no acute distress; appearance consistent with age of record HENT: normocephalic; atraumatic Eyes: pupils equal, round and reactive to light; extraocular muscles intact Neck: supple Heart: regular rate and rhythm Lungs: clear to auscultation bilaterally Abdomen: soft; nondistended; nontender; no masses or hepatosplenomegaly; bowel sounds present Extremities: No deformity; full range of motion; pulses normal Neurologic: Awake, alert and oriented; motor function intact in all extremities and symmetric; no facial droop Skin: Warm and dry; a few erythematous skin lesions consistent with insect bites Psychiatric: Normal mood and affect   RESULTS  Summary of this visit's results, reviewed by myself:   EKG Interpretation  Date/Time:    Ventricular Rate:    PR Interval:      QRS Duration:   QT Interval:    QTC Calculation:   R Axis:     Text Interpretation:        Laboratory Studies: No results found for this or any previous visit (from the past 24 hour(s)). Imaging Studies: No results found.  ED COURSE  Nursing notes and initial vitals signs, including pulse oximetry, reviewed.  Vitals:   03/28/17 0306 03/28/17 0513 03/28/17 0650  BP: (!) 149/85 130/79 122/71  Pulse: 70 60 64  Resp: 18 13 11   Temp: 98.2 F (36.8 C) 98.2 F (36.8 C) 97.7 F (36.5 C)  TempSrc: Oral Oral Oral  SpO2: 97% 97% 98%  Weight: 98.9 kg (218 lb)    Height: 5\' 10"  (1.778 m)     7:06 AM Patient drinking fluids without emesis after IV fluid bolus and Zofran.  PROCEDURES    ED DIAGNOSES     ICD-10-CM   1. Nausea, vomiting and diarrhea R11.2    R19.7   2. Tick bite, initial encounter W57.Encarnacion Chu, MD 03/28/17 [phone]

## 2017-03-28 NOTE — ED Notes (Signed)
Bed: WA17 Expected date:  Expected time:  Means of arrival:  Comments: Tick bit/vomiting and diarrhea

## 2017-04-01 ENCOUNTER — Ambulatory Visit: Payer: Self-pay | Admitting: Family Medicine

## 2017-04-01 DIAGNOSIS — Z0289 Encounter for other administrative examinations: Secondary | ICD-10-CM

## 2017-04-02 ENCOUNTER — Encounter (HOSPITAL_COMMUNITY): Admission: RE | Admit: 2017-04-02 | Payer: Medicare Other | Source: Ambulatory Visit

## 2017-04-04 ENCOUNTER — Encounter (HOSPITAL_COMMUNITY): Payer: Medicare Other

## 2017-04-05 ENCOUNTER — Encounter (HOSPITAL_BASED_OUTPATIENT_CLINIC_OR_DEPARTMENT_OTHER): Payer: Self-pay

## 2017-04-05 ENCOUNTER — Emergency Department (HOSPITAL_BASED_OUTPATIENT_CLINIC_OR_DEPARTMENT_OTHER): Payer: Medicare Other

## 2017-04-05 ENCOUNTER — Emergency Department (HOSPITAL_BASED_OUTPATIENT_CLINIC_OR_DEPARTMENT_OTHER)
Admission: EM | Admit: 2017-04-05 | Discharge: 2017-04-05 | Disposition: A | Discharge status: Home / Self Care | Payer: Medicare Other | Attending: Physician Assistant | Admitting: Physician Assistant

## 2017-04-05 DIAGNOSIS — I10 Essential (primary) hypertension: Secondary | ICD-10-CM | POA: Diagnosis not present

## 2017-04-05 DIAGNOSIS — R0789 Other chest pain: Secondary | ICD-10-CM | POA: Insufficient documentation

## 2017-04-05 DIAGNOSIS — C61 Malignant neoplasm of prostate: Secondary | ICD-10-CM | POA: Diagnosis not present

## 2017-04-05 DIAGNOSIS — R238 Other skin changes: Secondary | ICD-10-CM | POA: Insufficient documentation

## 2017-04-05 DIAGNOSIS — R079 Chest pain, unspecified: Secondary | ICD-10-CM | POA: Diagnosis not present

## 2017-04-05 DIAGNOSIS — E119 Type 2 diabetes mellitus without complications: Secondary | ICD-10-CM | POA: Diagnosis not present

## 2017-04-05 DIAGNOSIS — K5909 Other constipation: Secondary | ICD-10-CM | POA: Diagnosis not present

## 2017-04-05 DIAGNOSIS — J449 Chronic obstructive pulmonary disease, unspecified: Secondary | ICD-10-CM | POA: Diagnosis not present

## 2017-04-05 DIAGNOSIS — G4733 Obstructive sleep apnea (adult) (pediatric): Secondary | ICD-10-CM | POA: Diagnosis not present

## 2017-04-05 DIAGNOSIS — Z87891 Personal history of nicotine dependence: Secondary | ICD-10-CM | POA: Insufficient documentation

## 2017-04-05 DIAGNOSIS — Z79899 Other long term (current) drug therapy: Secondary | ICD-10-CM | POA: Diagnosis not present

## 2017-04-05 DIAGNOSIS — R208 Other disturbances of skin sensation: Secondary | ICD-10-CM

## 2017-04-05 DIAGNOSIS — R509 Fever, unspecified: Secondary | ICD-10-CM | POA: Diagnosis present

## 2017-04-05 LAB — URINALYSIS, ROUTINE W REFLEX MICROSCOPIC
BILIRUBIN URINE: NEGATIVE
Glucose, UA: NEGATIVE mg/dL
Hgb urine dipstick: NEGATIVE
Ketones, ur: NEGATIVE mg/dL
LEUKOCYTES UA: NEGATIVE
NITRITE: NEGATIVE
Protein, ur: NEGATIVE mg/dL
Specific Gravity, Urine: 1.02 (ref 1.005–1.030)
pH: 6.5 (ref 5.0–8.0)

## 2017-04-05 LAB — COMPREHENSIVE METABOLIC PANEL
ALT: 37 U/L (ref 17–63)
ANION GAP: 8 (ref 5–15)
AST: 29 U/L (ref 15–41)
Albumin: 4.1 g/dL (ref 3.5–5.0)
Alkaline Phosphatase: 50 U/L (ref 38–126)
BUN: 22 mg/dL — ABNORMAL HIGH (ref 6–20)
CHLORIDE: 103 mmol/L (ref 101–111)
CO2: 27 mmol/L (ref 22–32)
Calcium: 9 mg/dL (ref 8.9–10.3)
Creatinine, Ser: 1.31 mg/dL — ABNORMAL HIGH (ref 0.61–1.24)
GFR calc non Af Amer: 55 mL/min — ABNORMAL LOW (ref >60)
Glucose, Bld: 130 mg/dL — ABNORMAL HIGH (ref 65–99)
POTASSIUM: 4.7 mmol/L (ref 3.5–5.1)
SODIUM: 138 mmol/L (ref 135–145)
Total Bilirubin: 0.6 mg/dL (ref 0.3–1.2)
Total Protein: 7.1 g/dL (ref 6.5–8.1)

## 2017-04-05 LAB — CBC WITH DIFFERENTIAL/PLATELET
Basophils Absolute: 0 10*3/uL (ref 0.0–0.1)
Basophils Relative: 0 %
EOS ABS: 0.1 10*3/uL (ref 0.0–0.7)
EOS PCT: 2 %
HCT: 41 % (ref 39.0–52.0)
Hemoglobin: 14.2 g/dL (ref 13.0–17.0)
LYMPHS ABS: 0.7 10*3/uL (ref 0.7–4.0)
Lymphocytes Relative: 10 %
MCH: 33.2 pg (ref 26.0–34.0)
MCHC: 34.6 g/dL (ref 30.0–36.0)
MCV: 95.8 fL (ref 78.0–100.0)
MONOS PCT: 5 %
Monocytes Absolute: 0.4 10*3/uL (ref 0.1–1.0)
Neutro Abs: 6.1 10*3/uL (ref 1.7–7.7)
Neutrophils Relative %: 83 %
PLATELETS: 229 10*3/uL (ref 150–400)
RBC: 4.28 MIL/uL (ref 4.22–5.81)
RDW: 13 % (ref 11.5–15.5)
WBC: 7.3 10*3/uL (ref 4.0–10.5)

## 2017-04-05 LAB — TROPONIN I: Troponin I: <0.03 ng/mL (ref <0.03)

## 2017-04-05 MED ORDER — SODIUM CHLORIDE 0.9 % IV BOLUS (SEPSIS)
1000.0000 mL | Freq: Once | INTRAVENOUS | Status: AC
  Administered 2017-04-05: 1000 mL via INTRAVENOUS

## 2017-04-05 NOTE — Discharge Instructions (Signed)
Please follow up with your PCP. Please return with any high fevers or other concerns.

## 2017-04-05 NOTE — ED Provider Notes (Signed)
[facility] DEPT [facility] Provider Note   CSN: [id number] Arrival date & time: 04/05/17  2143  By signing my name below, I, [name], attest that this documentation has been prepared under the direction and in the presence of Chais Fehringer, Fredia Sorrow, MD. Electronically Signed: Reola Luna, ED Scribe. 04/05/17. 10:29 PM.  History   Chief Complaint Chief Complaint  Patient presents with  . Hypertension  . Chest Pain   The history is provided by the patient. No language interpreter was used.    HPI Comments: [name] is a 66 y.o. male COPD, DM, HTN, arthritis, emphysema, TB, schizophrenia, OSA on home O2, prostate cancer, who presents to the Emergency Department complaining of elevated blood pressures and subjective fever beginning earlier this afternoon. Per pt, he established care with a new PCP today with [facility] in [facility]; at that time he states that he was mildly nauseous and had experienced diarrhea this morning, but was otherwise asymptomatic. While he was there he was advised that his BP was elevated at 163/83. He left his appointment feeling towards his baseline, however, he experienced hot flashes this afternoon following his appointment and has felt subjectively feverish since. No measured temperature. He states that he took his BP medications following this episodes, but without significant relief of his symptoms. Wife additionally notes that he has been overexerting himself over the past several days and believes that this may be related. Per prior chart review, pt did have a nuclear stress test performed in 2017 which was negative at that time. He is currently followed by a Pulmonologist. Of note, no mention of chest pain during interview. He denies cough, urgency, frequency, hematuria, dysuria, difficulty urinating, or any other associated symptoms.   Past Medical History:  Diagnosis Date  . Anxiety disorder   . Arthritis    BILATERAL SHOULDERS,  ELBOWS AND HANDS AND LEFT HIP AND KNEES--HX CORTISONE SHOTS IN SHOULDERS, ELBOWS, HIP AND KNEES  . Basal cell carcinoma of back 04/30/2016   Dermatologist- Dr Nevada Crane;   MOHS sx- Dr Levada Dy   . Bladder outlet obstruction   . Chronic idiopathic constipation 05/08/2015  . Complication of anesthesia    DIFFICULT WAKING   . COPD (chronic obstructive pulmonary disease) [place] C Frequent exacerbations 01/13/2014   [name] 07/08/14: FeV1 51% FeV1/FVC 66% FVC 59% 10/5/2015ONO RA was normal  10/13/2014  ONO on RA NORMAL   . COPD, frequent exacerbations ([place])    pulmologist-  dr Joya Gaskins--  [place] Rm Stage C.04-25-15 recent COPD exacerbation-much improved now, after tx. in ER [facility].  Marland Kitchen Depression   . Diabetes mellitus without complication ([place])    BODERLINE - DIET CONTROL  . Dysrhythmia    PVC'S  . Emphysema lung ([place])    stage 2  . Family history of adverse reaction to anesthesia    father would wake up with agitation   . Former smoker 01/13/2014  . Gastroesophageal reflux disease without esophagitis   . Heavy alcohol use 04/30/2016  . History of chronic bronchitis   . History of oxygen administration    oxygen use 2 l/m nasally at bedtime and exertional occasions  . History of rheumatic fever   . History of TB (tuberculosis)    1984--  hospitalized for 4 month treatment  . History of urinary retention   . Hx of multiple concussions    x 2 per patient   . Hypertension   . Melanoma ([place])   . Nocturnal oxygen desaturation  USES O2 NIGHTLY  . OSA (obstructive sleep apnea) 01/13/2014  . PONV (postoperative nausea and vomiting)   . Post-polio syndrome    polio at age 59--PT WAS IN IRON LUNG; PT WAS IN W/C UNTIL AGE 88; STILL HAS WEAKNESS RIGHT SIDE  . Prostate cancer ([place])   . Schizophrenia ([place])   . Tuberculosis    Hosp 4 months rx , left early   . Urticaria    Patient Active Problem List   Diagnosis Date Noted  . Severe persistent asthma without complication 78/29/5621  . Bee sting-induced  anaphylaxis 02/12/2017  . Anaphylactic reaction due to food, initial encounter 02/12/2017  . Current use of beta blocker 02/12/2017  . Allergic rhinitis 01/15/2017  . COPD exacerbation ([place]) 11/04/2016  . Asthma exacerbation 11/03/2016  . Nausea without vomiting 09/17/2016  . Shortness of breath   . Diabetes mellitus type 2 in obese ([address]) 06/26/2016  . non-specific Chest pain 06/12/2016  . Patient's noncompliance with other medical treatment and regimen 06/02/2016  . Hyperlipidemia, mixed 06/02/2016  . Elevated liver enzymes  06/02/2016  . Schizophrenia ([place]) 04/30/2016  . Environmental and seasonal allergies 04/30/2016  . Diabetes mellitus without complication- (diet controlled; w/o proteinuria) 04/30/2016  . Heavy alcohol use- Many yrs  04/30/2016  . Basal cell carcinoma of back 04/30/2016  . Obesity 04/30/2016  . Hx of multiple concussions 04/30/2016  . h/o Vitamin D deficiency 04/30/2016  . Screen for sexually transmitted diseases 04/30/2016  . Immunocompromised state ([place]) 04/30/2016  . Cervical radiculopathy 01/15/2016  . Alcohol withdrawal ([place]) 11/22/2015  . h/o Suicidal thoughts 11/21/2015  . Dry mouth 10/07/2015  . Anxiety 09/19/2015  . OAB (overactive bladder) 07/27/2015  . Chronic idiopathic constipation 05/08/2015  . Screening for colon cancer   . Benign neoplasm of cecum   . Benign neoplasm of ascending colon   . Benign neoplasm of transverse colon   . Benign neoplasm of sigmoid colon   . Rectal polyp   . Gastroesophageal reflux disease without esophagitis   . Esophageal stricture   . Joint stiffness of multiple sites 10/17/2014  . Benign localized hyperplasia of prostate with urinary obstruction 09/29/2014  . concerns for memory loss 09/13/2014  . Tuberculosis   . Diverticulum of bladder 06/17/2014  . Neuropathy ([place]) 04/11/2014  . Post-polio syndrome 01/13/2014  . Other emphysema ([place]) 01/13/2014  . Hypertension 01/13/2014  . Former smoker 01/13/2014  .  h/o Prostate cancer 01/13/2014  . History of tuberculosis 01/13/2014  . Bipolar disorder ([place]) 01/13/2014  . Explosive personality disorder 01/13/2014  . Erectile dysfunction 01/13/2014  . Nephrolithiasis 01/13/2014  . Chronic pain 01/13/2014  . Alcoholism ([place]) 01/13/2014  . OSA (obstructive sleep apnea) 01/13/2014   Past Surgical History:  Procedure Laterality Date  . CARDIOVASCULAR STRESS TEST  06-08-2014  dr Mare Ferrari   normal lexiscan study/  no ischemia/  not gated due to PAC's  . COLONOSCOPY N/A 05/03/2015   Procedure: COLONOSCOPY;  Surgeon: [name], MD;  Location: [facility];  Service: Endoscopy;  Laterality: N/A;  . CYSTOSCOPY N/A 10/25/2015   Procedure: CYSTOSCOPY;  Surgeon: [name], MD;  Location: [facility];  Service: Urology;  Laterality: N/A;  . CYSTOSCOPY W/ CYSTOGRAM/  TRANSRECTAL ULTRASOUND PROSTATE BX  03-22-2009  . ESOPHAGOGASTRODUODENOSCOPY N/A 03/22/2015   Procedure: ESOPHAGOGASTRODUODENOSCOPY (EGD) with dilation;  Surgeon: [name], MD;  Location: [facility];  Service: Endoscopy;  Laterality: N/A;  . excision of skin lesion    . LAPAROSCOPIC CHOLECYSTECTOMY  2013  .  left elbow surgery      due to fracture   . NASAL SEPTUM SURGERY  2000  . OTHER SURGICAL HISTORY      Muscle & bone Graft/Polio  . polio surgeries      14 polio surgeries   . PROSTATE BIOPSY N/A 09/28/2014   Procedure: PROSTATE ULTRASOUND/BIOPSY;  Surgeon: [name], MD;  Location: [facility];  Service: Urology;  Laterality: N/A;  . PROSTATE BIOPSY N/A 10/25/2015   Procedure: PROSTATE BIOPSY AND ULTRASOUND;  Surgeon: [name], MD;  Location: [facility];  Service: Urology;  Laterality: N/A;  . SAVORY DILATION N/A 03/22/2015   Procedure: SAVORY DILATION;  Surgeon: [name], MD;  Location: [facility];  Service: Endoscopy;  Laterality: N/A;  . SHOULDER ARTHROSCOPY WITH OPEN ROTATOR CUFF REPAIR Bilateral 2013  &  1999   removal spurs and bursectomy  . TRANSURETHRAL INCISION OF BLADDER NECK  N/A 10/25/2015   Procedure:  TRANSURETHRAL INCISION OF BLADDER NECK;  Surgeon: [name], MD;  Location: [facility];  Service: Urology;  Laterality: N/A;  . TRANSURETHRAL RESECTION OF PROSTATE N/A 09/28/2014   Procedure: TRANSURETHRAL RESECTION OF THE PROSTATE (TURP);  Surgeon: [name], MD;  Location: [facility];  Service: Urology;  Laterality: N/A;  . URETEROSOPY STONE EXTRACTION  2000    Home Medications    Prior to Admission medications   Medication Sig Start Date End Date Taking? Authorizing Provider  acetaminophen (TYLENOL) 500 MG tablet Take 500 mg by mouth every 6 (six) hours as needed for headache (pain).    [provider]  albuterol (PROVENTIL) (2.5 MG/3ML) 0.083% nebulizer solution Take 2.5 mg by nebulization every 6 (six) hours as needed for wheezing or shortness of breath.    [provider]  albuterol (VENTOLIN HFA) 108 (90 Base) MCG/ACT inhaler Inhale 2 puffs into the lungs every 4 (four) hours as needed for wheezing or shortness of breath.    [provider]  ALPRAZolam [name]) 1 MG tablet Take 0.5 tablets (0.5 mg total) by mouth 2 (two) times daily as needed for anxiety. Patient taking differently: Take 0.5 mg by mouth 4 (four) times daily.  10/29/16   Shelda Pal, DO  arformoterol (BROVANA) 15 MCG/2ML NEBU Take 2 mLs (15 mcg total) by nebulization 2 (two) times daily. 10/24/16   Rigoberto Noel, MD  baclofen (LIORESAL) 10 MG tablet Take 10 mg by mouth at bedtime.    [provider]  bisoprolol (ZEBETA) 5 MG tablet TAKE 1 TABLET BY MOUTH DAILY 03/25/17   Nani Ravens, Crosby Oyster, DO  budesonide (PULMICORT) 0.25 MG/2ML nebulizer solution Take 2 mLs (0.25 mg total) by nebulization 2 (two) times daily. 09/18/16   Parrett, Fonnie Mu, NP  dexlansoprazole (DEXILANT) 60 MG capsule Take 1 capsule (60 mg total) by mouth daily. Patient taking differently: Take 60 mg by mouth every other day.  09/17/16   [place], Betty G, MD  EPINEPHrine 0.3 mg/0.3 mL IJ  SOAJ injection Inject 0.3 mLs (0.3 mg total) into the muscle once. Patient taking differently: Inject 0.3 mg into the muscle once as needed (severe allergic reactions).  02/24/16   Malvin Johns, MD  HYDROcodone-acetaminophen (NORCO/VICODIN) 5-325 MG tablet Take 1 tablet by mouth every 6 (six) hours as needed for moderate pain.    [provider]  loratadine (CLARITIN) 10 MG tablet Take 10 mg by mouth daily.    [provider]  nitroGLYCERIN (NITROSTAT) 0.4 MG SL tablet Place 1 tablet (0.4 mg total) under  the tongue every 5 (five) minutes as needed for chest pain. 06/21/15   Brunetta Jeans, PA-C  ondansetron (ZOFRAN ODT) 8 MG disintegrating tablet Take 1 tablet (8 mg total) by mouth every 8 (eight) hours as needed for nausea or vomiting. 03/28/17   Molpus, John, MD  OXYGEN Inhale 2 L into the lungs at bedtime. Reported on 11/25/2015    [provider]  triamcinolone cream (KENALOG) 0.1 % Apply twice a day if needed to red itchy areas below the face to insect bites. 02/12/17   Charlies Silvers, MD   Family History Family History  Problem Relation Age of Onset  . Alzheimer's disease Father 4       Deceased  . Stomach cancer Father   . Heart attack Father   . Heart disease Father   . Skin cancer Mother        Facial-Living  . Cancer Mother        skin  . Alcohol abuse Sister        x2  . Mental illness Sister        x2  . Alcohol abuse Sister   . Diabetes Maternal Aunt        x2  . Thyroid disease Maternal Aunt        x4  . Diabetes Maternal Uncle   . Tuberculosis Paternal Grandfather   . Tuberculosis Paternal Grandmother   . Alzheimer's disease Paternal Aunt   . Alzheimer's disease Paternal Uncle   . Colon cancer Neg Hx   . Colon polyps Neg Hx   . Crohn's disease Neg Hx   . Ulcerative colitis Neg Hx   . Allergic rhinitis Neg Hx   . Angioedema Neg Hx   . Asthma Neg Hx   . Eczema Neg Hx   . Urticaria Neg Hx    Social History Social History  Substance  Use Topics  . Smoking status: Former Smoker    Years: 47.00    Types: Pipe    Quit date: 08/25/2013  . Smokeless tobacco: Former Systems developer     Comment: 5-6 times smoking a pipe  . Alcohol use 8.4 oz/week    14 Cans of beer per week     Comment: 2 cans daily   Allergies   Aspirin; Hornet venom; Ivp dye [iodinated diagnostic agents]; Levaquin [levofloxacin in d5w]; Nsaids; Penicillins; Tolmetin; Perforomist [formoterol]; Buprenorphine hcl; Morphine and related; and Oxycodone  Review of Systems Review of Systems  Constitutional: Positive for fever (subjective).       +hot flashes  Respiratory: Negative for cough.   Genitourinary: Negative for difficulty urinating, dysuria, frequency, hematuria and urgency.  All other systems reviewed and are negative.  Physical Exam Updated Vital Signs BP (!) 151/63 (BP Location: Left Arm)   Pulse 67   Temp 98.2 F (36.8 C) (Oral)   Resp 18   Ht 5\' 11"  (1.803 m)   Wt 99.6 kg (219 lb 9.3 oz)   SpO2 96%   BMI 30.62 kg/m   Physical Exam  Constitutional: He appears well-developed and well-nourished. No distress.  HENT:  Head: Normocephalic and atraumatic.  Right Ear: Tympanic membrane, external ear and ear canal normal.  Left Ear: Tympanic membrane, external ear and ear canal normal.  Mouth/Throat: Uvula is midline and oropharynx is clear and moist. No oropharyngeal exudate, posterior oropharyngeal edema, posterior oropharyngeal erythema or tonsillar abscesses. No tonsillar exudate.  Eyes: Conjunctivae are normal.  Neck: Normal range of motion.  Cardiovascular: Normal rate,  regular rhythm and normal heart sounds.   No murmur heard. Pulmonary/Chest: Effort normal and breath sounds normal. No respiratory distress. He has no wheezes. He has no rales.  Abdominal: He exhibits no distension.  Musculoskeletal: Normal range of motion.  Neurological: He is alert.  Skin: No pallor.  Psychiatric: He has a normal mood and affect. His behavior is normal.    Nursing note and vitals reviewed.  ED Treatments / Results  DIAGNOSTIC STUDIES: Oxygen Saturation is 96% on RA, normal by my interpretation.   COORDINATION OF CARE: 10:27 PM-Discussed next steps with pt. Pt verbalized understanding and is agreeable with the plan.   Labs (all labs ordered are listed, but only abnormal results are displayed) Labs Reviewed  COMPREHENSIVE METABOLIC PANEL - Abnormal; Notable for the following:       Result Value   Glucose, Bld 130 (*)    BUN 22 (*)    Creatinine, Ser 1.31 (*)    GFR calc non Af Amer 55 (*)    All other components within normal limits  CBC WITH DIFFERENTIAL/PLATELET  TROPONIN I  URINALYSIS, ROUTINE W REFLEX MICROSCOPIC   EKG  EKG Interpretation  Date/Time:  Friday April 05 2017 22:30:27 EDT Ventricular Rate:  62 PR Interval:    QRS Duration: 97 QT Interval:  417 QTC Calculation: 424 R Axis:   65 Text Interpretation:  Sinus rhythm No significant change since last tracing Confirmed by Zenovia Jarred [phone]) on 04/05/2017 10:33:12 PM      Radiology Dg Chest 2 View  Result Date: 04/05/2017 CLINICAL DATA:  Acute onset of generalized chest pain. High blood pressure. Initial encounter. EXAM: CHEST  2 VIEW COMPARISON:  Chest radiograph performed 03/09/2017 FINDINGS: The lungs are well-aerated. Minimal left basilar atelectasis is noted. No pleural effusion or pneumothorax is seen. There is no evidence of pleural effusion or pneumothorax. The heart is normal in size; the mediastinal contour is within normal limits. No acute osseous abnormalities are seen. Clips are noted within the right upper quadrant, reflecting prior cholecystectomy. IMPRESSION: Minimal left basilar atelectasis noted.  Lungs otherwise clear. Electronically Signed   By: Garald Balding M.D.   On: 04/05/2017 22:15    Procedures Procedures   Medications Ordered in ED Medications  sodium chloride 0.9 % bolus 1,000 mL (1,000 mLs Intravenous New Bag/Given 04/05/17  2232)    Initial Impression / Assessment and Plan / ED Course  I have reviewed the triage vital signs and the nursing notes.  Pertinent labs & imaging results that were available during my care of the patient were reviewed by me and considered in my medical decision making (see chart for details).    I personally performed the services described in this documentation, which was scribed in my presence. The recorded information has been reviewed and is accurate.   Patient has rather nebulous symptoms today. Patient's reports that he isn't feeling had a warm. Patient with his PCP today where they discussed blood pressure. Patient's blood pressure seems the same as it was previous. Patient denies any acute chest pain. He said he might have felt a little uncomfortable this morning. Patient has no shortness breath. Patient has no urinary symptoms. Patient has no measurable fever. Patient's wife states "sometimes he just wants to make sure that everything is okay".  We will do baseline labs look for source of infection including urine and chest x-ray. Patient's EKG is nonischemic. Plan to have patient follow up with PCP as an outpatient.  Patient is comfortable,  ambulatory, and taking PO at time of discharge.  Patient expressed understanding about return precautions.    I personally performed the services described in this documentation, which was scribed in my presence. The recorded information has been reviewed and is accurate.     Final Clinical Impressions(s) / ED Diagnoses   Final diagnoses:  None   New Prescriptions New Prescriptions   No medications on file       Macarthur Critchley, MD 04/05/17 2342

## 2017-04-05 NOTE — ED Triage Notes (Signed)
C/o elevated BP, CP x today-states he was seen by PCP today for new pt-was advised BP 163/83-NAD-steady gait

## 2017-04-09 ENCOUNTER — Encounter (HOSPITAL_COMMUNITY): Payer: Medicare Other

## 2017-04-09 NOTE — Telephone Encounter (Signed)
I called [employer] today, to confirm pt. Is at stage 2 and unfortunately has a long way to go before stage 3 even then he's still responsible for 5%.(benefits)  I gave [employer] his approved dates and the [place]. #. The rep. Told me once they have processed this [facility] will call [name] and let him know exactly what he will owe and point him in the right direction for co-pay assistance if needed.([employer]) I called pt. To make him aware. Nothing further needed at this time. Closing.

## 2017-04-10 ENCOUNTER — Telehealth: Payer: Self-pay | Admitting: Pulmonary Disease

## 2017-04-10 NOTE — Telephone Encounter (Addendum)
Yes, thank you, I received that fax and called the pt., He called me back today. [name] had received the letter also. We talked about the cost, I explained to him if he would call [facility] they would be able to tell him exactly what he owes. Then he asked me where the shots would be given and he said he would not take the shots unless he was able to get his shots at the hospital. He will need to discuss this matter with RA. Pt. Is so allergic he's afraid he'll react. Someone had already told him he will wait in the office for 2 hrs., all the while being monitored for those  2 hrs.. He said he's had too many close calls, he'd rather be at the hospital to begin with. We also discussed the frequency of xolair (q 2 wks.) For now pt. Is going to call [facility] about the cost. I will addend this note again if necessary.

## 2017-04-10 NOTE — Telephone Encounter (Signed)
Received a fax from [facility] that the patient's Xolair has been approved from 04/04/17 to 10/04/17. Will forward to [employer] so that she is aware.

## 2017-04-11 ENCOUNTER — Encounter (HOSPITAL_COMMUNITY): Payer: Medicare Other

## 2017-04-16 ENCOUNTER — Encounter (HOSPITAL_COMMUNITY): Payer: Medicare Other

## 2017-04-18 ENCOUNTER — Encounter (HOSPITAL_COMMUNITY): Payer: Medicare Other

## 2017-04-20 DIAGNOSIS — Z8611 Personal history of tuberculosis: Secondary | ICD-10-CM | POA: Diagnosis not present

## 2017-04-20 DIAGNOSIS — Z79899 Other long term (current) drug therapy: Secondary | ICD-10-CM | POA: Diagnosis not present

## 2017-04-20 DIAGNOSIS — Z881 Allergy status to other antibiotic agents status: Secondary | ICD-10-CM | POA: Diagnosis not present

## 2017-04-20 DIAGNOSIS — Z9049 Acquired absence of other specified parts of digestive tract: Secondary | ICD-10-CM | POA: Diagnosis not present

## 2017-04-20 DIAGNOSIS — Z886 Allergy status to analgesic agent status: Secondary | ICD-10-CM | POA: Diagnosis not present

## 2017-04-20 DIAGNOSIS — Z8546 Personal history of malignant neoplasm of prostate: Secondary | ICD-10-CM | POA: Diagnosis not present

## 2017-04-20 DIAGNOSIS — K219 Gastro-esophageal reflux disease without esophagitis: Secondary | ICD-10-CM | POA: Diagnosis not present

## 2017-04-20 DIAGNOSIS — Z87891 Personal history of nicotine dependence: Secondary | ICD-10-CM | POA: Diagnosis not present

## 2017-04-20 DIAGNOSIS — R197 Diarrhea, unspecified: Secondary | ICD-10-CM | POA: Diagnosis not present

## 2017-04-20 DIAGNOSIS — N323 Diverticulum of bladder: Secondary | ICD-10-CM | POA: Diagnosis not present

## 2017-04-20 DIAGNOSIS — Z91030 Bee allergy status: Secondary | ICD-10-CM | POA: Diagnosis not present

## 2017-04-20 DIAGNOSIS — I10 Essential (primary) hypertension: Secondary | ICD-10-CM | POA: Diagnosis not present

## 2017-04-20 DIAGNOSIS — Z7952 Long term (current) use of systemic steroids: Secondary | ICD-10-CM | POA: Diagnosis not present

## 2017-04-20 DIAGNOSIS — Z7951 Long term (current) use of inhaled steroids: Secondary | ICD-10-CM | POA: Diagnosis not present

## 2017-04-20 DIAGNOSIS — F419 Anxiety disorder, unspecified: Secondary | ICD-10-CM | POA: Diagnosis not present

## 2017-04-20 DIAGNOSIS — C61 Malignant neoplasm of prostate: Secondary | ICD-10-CM | POA: Diagnosis not present

## 2017-04-20 DIAGNOSIS — Z880 Allergy status to penicillin: Secondary | ICD-10-CM | POA: Diagnosis not present

## 2017-04-20 DIAGNOSIS — Z91041 Radiographic dye allergy status: Secondary | ICD-10-CM | POA: Diagnosis not present

## 2017-04-20 DIAGNOSIS — J449 Chronic obstructive pulmonary disease, unspecified: Secondary | ICD-10-CM | POA: Diagnosis not present

## 2017-04-20 DIAGNOSIS — Z888 Allergy status to other drugs, medicaments and biological substances status: Secondary | ICD-10-CM | POA: Diagnosis not present

## 2017-04-20 DIAGNOSIS — R1031 Right lower quadrant pain: Secondary | ICD-10-CM | POA: Diagnosis not present

## 2017-04-20 DIAGNOSIS — R110 Nausea: Secondary | ICD-10-CM | POA: Diagnosis not present

## 2017-04-22 ENCOUNTER — Telehealth: Payer: Self-pay | Admitting: Pulmonary Disease

## 2017-04-22 DIAGNOSIS — I10 Essential (primary) hypertension: Secondary | ICD-10-CM | POA: Diagnosis not present

## 2017-04-22 DIAGNOSIS — E559 Vitamin D deficiency, unspecified: Secondary | ICD-10-CM | POA: Diagnosis not present

## 2017-04-22 DIAGNOSIS — F411 Generalized anxiety disorder: Secondary | ICD-10-CM | POA: Insufficient documentation

## 2017-04-22 NOTE — Addendum Note (Signed)
Encounter addended by: Jewel Baize, RD on: 04/22/2017  8:42 AM<BR>    Actions taken: Visit Navigator Flowsheet section accepted

## 2017-04-22 NOTE — Telephone Encounter (Signed)
The pt. Called his secondary ins. And they told him he shouldn't have to pay anything. This probably true for buy & bill. Pt. Gave me the agent's name and #, I called him but no response yet. Pt. Is insistence on getting the shots at the hospital. I told him he would need to call RA and discuss that with him. Will leave encounter open for now.

## 2017-04-23 ENCOUNTER — Encounter (HOSPITAL_COMMUNITY): Payer: Medicare Other

## 2017-04-23 NOTE — Progress Notes (Signed)
Discharge Summary  Patient Details  Name: [name] MRN: [id number] Date of Birth: 02/21/1951 Referring Provider:     Pulmonary Rehab Walk Test from 02/28/2017 in [place]  Referring Provider  Dr. Elsworth Soho       Number of Visits: 3  Reason for Discharge:  Early Exit:  Due to a recent separation from his spouse  Smoking History:  History  Smoking Status  . Former Smoker  . Years: 47.00  . Types: Pipe  . Quit date: 08/25/2013  Smokeless Tobacco  . Former Systems developer    Comment: 5-6 times smoking a pipe    Diagnosis:  Pulmonary emphysema, unspecified emphysema type ([place])  ADL UCSD:   Initial Exercise Prescription:     Initial Exercise Prescription - 02/28/17 1600      Date of Initial Exercise RX and Referring Provider   Date 02/28/17   Referring Provider Dr. Elsworth Soho     Bike   Level 0.5   Minutes 17     NuStep   Level 2   Minutes 17   METs 1.5     Track   Laps 10   Minutes 17     Prescription Details   Frequency (times per week) 2   Duration Progress to 45 minutes of aerobic exercise without signs/symptoms of physical distress     Intensity   THRR 40-80% of Max Heartrate 61-122   Ratings of Perceived Exertion 11-13   Perceived Dyspnea 0-4     Progression   Progression Continue progressive overload as per policy without signs/symptoms or physical distress.     Resistance Training   Training Prescription Yes   Weight blue bands   Reps 10-15      Discharge Exercise Prescription (Final Exercise Prescription Changes):     Exercise Prescription Changes - 03/14/17 1600      Response to Exercise   Blood Pressure (Admit) 114/66   Blood Pressure (Exercise) 132/88   Blood Pressure (Exit) 118/72   Heart Rate (Admit) 62 bpm   Heart Rate (Exercise) 72 bpm   Heart Rate (Exit) 58 bpm   Oxygen Saturation (Admit) 98 %   Oxygen Saturation (Exercise) 95 %   Oxygen Saturation (Exit) 97 %   Rating of Perceived Exertion  (Exercise) 12   Perceived Dyspnea (Exercise) 2   Duration Progress to 45 minutes of aerobic exercise without signs/symptoms of physical distress   Intensity THRR unchanged     Progression   Progression Continue to progress workloads to maintain intensity without signs/symptoms of physical distress.     Resistance Training   Training Prescription Yes   Weight blue bands   Reps 10-15   Time 10 Minutes     Bike   Level 0.5   Minutes 17     Track   Laps 9   Minutes 17      Functional Capacity:     6 Minute Walk    Row Name 02/28/17 1601         6 Minute Walk   Phase Initial     Distance 1400 feet     Walk Time 6 minutes     # of Rest Breaks 0     MPH 2.65     METS 3.07     RPE 13     Perceived Dyspnea  3     Symptoms Yes (comment)     Comments "woozy" at the end of walk test, knee pain (normal), stated  he felt palpitation x1     Resting HR 66 bpm     Resting BP 118/70     Max Ex. HR 108 bpm     Max Ex. BP 154/80       Interval HR   Baseline HR 66     1 Minute HR 74     2 Minute HR 74     3 Minute HR 103     4 Minute HR 108     5 Minute HR 100     6 Minute HR 97     2 Minute Post HR 76     Interval Heart Rate? Yes       Interval Oxygen   Interval Oxygen? Yes     Baseline Oxygen Saturation % 96 %     Baseline Liters of Oxygen 0 L     1 Minute Oxygen Saturation % 94 %     1 Minute Liters of Oxygen 0 L     2 Minute Oxygen Saturation % 90 %     2 Minute Liters of Oxygen 0 L     3 Minute Oxygen Saturation % 95 %     3 Minute Liters of Oxygen 0 L     4 Minute Oxygen Saturation % 95 %     4 Minute Liters of Oxygen 0 L     5 Minute Oxygen Saturation % 95 %     5 Minute Liters of Oxygen 0 L     6 Minute Oxygen Saturation % 94 %     6 Minute Liters of Oxygen 0 L     2 Minute Post Oxygen Saturation % 96 %     2 Minute Post Liters of Oxygen 0 L        Psychological, QOL, Others - Outcomes: PHQ 2/9: Depression screen [facility] 2/9 02/11/2017 11/16/2016 06/28/2016  12/12/2015  Decreased Interest 3 0 0 1  Down, Depressed, Hopeless 3 1 0 1  PHQ - 2 Score 6 1 0 2  Altered sleeping 1 - - 1  Tired, decreased energy 1 - - 1  Change in appetite 0 - - 1  Feeling bad or failure about yourself  0 - - 1  Trouble concentrating 0 - - 1  Moving slowly or fidgety/restless 1 - - 1  Suicidal thoughts 0 - - 1  PHQ-9 Score 9 - - 9  Difficult doing work/chores Somewhat difficult - - Somewhat difficult  Some recent data might be hidden    Quality of Life:   Personal Goals: Goals established at orientation with interventions provided to work toward goal.     Personal Goals and Risk Factors at Admission - 02/11/17 1304      Core Components/Risk Factors/Patient Goals on Admission   Improve shortness of breath with ADL's Yes   Intervention Provide education, individualized exercise plan and daily activity instruction to help decrease symptoms of SOB with activities of daily living.   Expected Outcomes Short Term: Achieves a reduction of symptoms when performing activities of daily living.   Develop more efficient breathing techniques such as purse lipped breathing and diaphragmatic breathing; and practicing self-pacing with activity Yes   Intervention Provide education, demonstration and support about specific breathing techniuqes utilized for more efficient breathing. Include techniques such as pursed lipped breathing, diaphragmatic breathing and self-pacing activity.   Expected Outcomes Short Term: Participant will be able to demonstrate and use breathing techniques as needed throughout daily activities.  Increase knowledge of respiratory medications and ability to use respiratory devices properly  Yes   Intervention Provide education and demonstration as needed of appropriate use of medications, inhalers, and oxygen therapy.   Expected Outcomes Short Term: Achieves understanding of medications use. Understands that oxygen is a medication prescribed by physician.  Demonstrates appropriate use of inhaler and oxygen therapy.       Personal Goals Discharge:     Goals and Risk Factor Review    Row Name 03/21/17 0916             Core Components/Risk Factors/Patient Goals Review   Personal Goals Review Develop more efficient breathing techniques such as purse lipped breathing and diaphragmatic breathing and practicing self-pacing with activity.;Increase knowledge of respiratory medications and ability to use respiratory devices properly.;Improve shortness of breath with ADL's       Review New to program, has attended 3 exercise sessions, too early to have met core component goals.       Expected Outcomes Should see improved strength, stamina and decrease in SOB with exercise workload increases in the next 30 days          Nutrition & Weight - Outcomes:     Pre Biometrics - 02/11/17 1304      Pre Biometrics   Grip Strength 58 kg       Nutrition:   Nutrition Discharge:     Nutrition Assessments - 04/22/17 0841      Rate Your Plate Scores   Pre Score --  returned incomplete      Education Questionnaire Score:   Goals reviewed with patient; copy given to patient.

## 2017-04-23 NOTE — Addendum Note (Signed)
Encounter addended by: Lance Morin, RN on: 04/23/2017  4:00 PM<BR>    Actions taken: Sign clinical note, Episode resolved

## 2017-04-25 ENCOUNTER — Encounter (HOSPITAL_COMMUNITY): Payer: Medicare Other

## 2017-04-25 DIAGNOSIS — I10 Essential (primary) hypertension: Secondary | ICD-10-CM | POA: Diagnosis not present

## 2017-04-25 DIAGNOSIS — G8929 Other chronic pain: Secondary | ICD-10-CM | POA: Diagnosis not present

## 2017-04-25 DIAGNOSIS — Z9889 Other specified postprocedural states: Secondary | ICD-10-CM | POA: Diagnosis not present

## 2017-04-25 DIAGNOSIS — M25562 Pain in left knee: Secondary | ICD-10-CM | POA: Diagnosis not present

## 2017-04-25 DIAGNOSIS — M25511 Pain in right shoulder: Secondary | ICD-10-CM | POA: Diagnosis not present

## 2017-04-26 NOTE — Telephone Encounter (Addendum)
I spoke with [name] agent: just as I thought that's under buy and bill, that he'll be able to get the med. Free. Pt. Feels strongly about having his shots given at the hosp..  I know that option is available to him just not sure how  MR would feel about that. I urged him to call and leave a message for MR, he hasn't yet.

## 2017-04-29 DIAGNOSIS — N5201 Erectile dysfunction due to arterial insufficiency: Secondary | ICD-10-CM | POA: Diagnosis not present

## 2017-04-29 DIAGNOSIS — I10 Essential (primary) hypertension: Secondary | ICD-10-CM | POA: Diagnosis not present

## 2017-04-29 DIAGNOSIS — C61 Malignant neoplasm of prostate: Secondary | ICD-10-CM | POA: Diagnosis not present

## 2017-04-29 DIAGNOSIS — Q5320 Undescended testicle, unspecified, bilateral: Secondary | ICD-10-CM | POA: Diagnosis not present

## 2017-04-30 ENCOUNTER — Encounter (HOSPITAL_COMMUNITY): Payer: Medicare Other

## 2017-04-30 NOTE — Telephone Encounter (Signed)
Called pt. Today b/c we got a fax he was approved. Lmom for him to call [facility] and that Arvid Right is not a medicine that is given in the hosp.. I explained if he wants to pursue getting his injections at the hosp., we will have to get with RA about him getting cinquair. (which is given at the [facility].Marland Kitchen

## 2017-05-02 ENCOUNTER — Encounter (HOSPITAL_COMMUNITY): Payer: Medicare Other

## 2017-05-07 ENCOUNTER — Encounter (HOSPITAL_COMMUNITY): Payer: Medicare Other

## 2017-05-08 NOTE — Telephone Encounter (Signed)
Called pt. Back went to vm. Closing note, I'm hoping he'll call back and leave a message for Dr.Alva. Pt. Is set on getting injections at the hosp.Joellen [place] told me our Dr.'s don't send pt.s to the hosp. For [employer]. Cinquair is the only one they give in the hosp.Marland Kitchen

## 2017-05-09 ENCOUNTER — Other Ambulatory Visit: Payer: Self-pay | Admitting: Physician Assistant

## 2017-05-09 ENCOUNTER — Encounter (HOSPITAL_COMMUNITY): Payer: Medicare Other

## 2017-05-14 ENCOUNTER — Encounter (HOSPITAL_COMMUNITY): Payer: Medicare Other

## 2017-05-16 ENCOUNTER — Encounter (HOSPITAL_COMMUNITY): Payer: Medicare Other

## 2017-05-17 DIAGNOSIS — K5903 Drug induced constipation: Secondary | ICD-10-CM | POA: Diagnosis not present

## 2017-05-17 DIAGNOSIS — I10 Essential (primary) hypertension: Secondary | ICD-10-CM | POA: Diagnosis not present

## 2017-05-17 DIAGNOSIS — R1031 Right lower quadrant pain: Secondary | ICD-10-CM | POA: Diagnosis not present

## 2017-05-21 ENCOUNTER — Encounter: Payer: Self-pay | Admitting: Physician Assistant

## 2017-05-21 ENCOUNTER — Encounter (HOSPITAL_COMMUNITY): Payer: Medicare Other

## 2017-05-23 ENCOUNTER — Encounter (HOSPITAL_COMMUNITY): Payer: Medicare Other

## 2017-05-28 ENCOUNTER — Encounter (HOSPITAL_COMMUNITY): Payer: Medicare Other

## 2017-05-29 DIAGNOSIS — J45909 Unspecified asthma, uncomplicated: Secondary | ICD-10-CM | POA: Diagnosis not present

## 2017-05-29 DIAGNOSIS — J449 Chronic obstructive pulmonary disease, unspecified: Secondary | ICD-10-CM | POA: Diagnosis not present

## 2017-05-29 DIAGNOSIS — J309 Allergic rhinitis, unspecified: Secondary | ICD-10-CM | POA: Diagnosis not present

## 2017-05-29 DIAGNOSIS — I10 Essential (primary) hypertension: Secondary | ICD-10-CM | POA: Diagnosis not present

## 2017-05-30 ENCOUNTER — Encounter (HOSPITAL_COMMUNITY): Payer: Medicare Other

## 2017-05-31 DIAGNOSIS — R413 Other amnesia: Secondary | ICD-10-CM | POA: Diagnosis not present

## 2017-05-31 DIAGNOSIS — I10 Essential (primary) hypertension: Secondary | ICD-10-CM | POA: Diagnosis not present

## 2017-05-31 DIAGNOSIS — E119 Type 2 diabetes mellitus without complications: Secondary | ICD-10-CM | POA: Diagnosis not present

## 2017-05-31 DIAGNOSIS — Z6830 Body mass index (BMI) 30.0-30.9, adult: Secondary | ICD-10-CM | POA: Diagnosis not present

## 2017-05-31 DIAGNOSIS — Z818 Family history of other mental and behavioral disorders: Secondary | ICD-10-CM | POA: Diagnosis not present

## 2017-06-03 DIAGNOSIS — J449 Chronic obstructive pulmonary disease, unspecified: Secondary | ICD-10-CM | POA: Diagnosis not present

## 2017-06-04 ENCOUNTER — Encounter (HOSPITAL_COMMUNITY): Payer: Medicare Other

## 2017-06-05 DIAGNOSIS — J449 Chronic obstructive pulmonary disease, unspecified: Secondary | ICD-10-CM | POA: Diagnosis not present

## 2017-06-05 DIAGNOSIS — R0789 Other chest pain: Secondary | ICD-10-CM | POA: Diagnosis not present

## 2017-06-05 DIAGNOSIS — Z87891 Personal history of nicotine dependence: Secondary | ICD-10-CM | POA: Diagnosis not present

## 2017-06-05 DIAGNOSIS — J9811 Atelectasis: Secondary | ICD-10-CM | POA: Diagnosis not present

## 2017-06-05 DIAGNOSIS — K760 Fatty (change of) liver, not elsewhere classified: Secondary | ICD-10-CM | POA: Diagnosis not present

## 2017-06-05 DIAGNOSIS — R0602 Shortness of breath: Secondary | ICD-10-CM | POA: Diagnosis not present

## 2017-06-06 ENCOUNTER — Encounter (HOSPITAL_COMMUNITY): Payer: Medicare Other

## 2017-06-06 DIAGNOSIS — J449 Chronic obstructive pulmonary disease, unspecified: Secondary | ICD-10-CM | POA: Diagnosis not present

## 2017-06-11 ENCOUNTER — Encounter (HOSPITAL_COMMUNITY): Payer: Medicare Other

## 2017-06-12 DIAGNOSIS — J449 Chronic obstructive pulmonary disease, unspecified: Secondary | ICD-10-CM | POA: Diagnosis not present

## 2017-06-13 ENCOUNTER — Encounter (HOSPITAL_COMMUNITY): Payer: Medicare Other

## 2017-06-13 DIAGNOSIS — Z91041 Radiographic dye allergy status: Secondary | ICD-10-CM | POA: Diagnosis not present

## 2017-06-13 DIAGNOSIS — K219 Gastro-esophageal reflux disease without esophagitis: Secondary | ICD-10-CM | POA: Diagnosis not present

## 2017-06-13 DIAGNOSIS — I10 Essential (primary) hypertension: Secondary | ICD-10-CM | POA: Diagnosis not present

## 2017-06-13 DIAGNOSIS — F419 Anxiety disorder, unspecified: Secondary | ICD-10-CM | POA: Diagnosis not present

## 2017-06-13 DIAGNOSIS — Z7951 Long term (current) use of inhaled steroids: Secondary | ICD-10-CM | POA: Diagnosis not present

## 2017-06-13 DIAGNOSIS — Z91030 Bee allergy status: Secondary | ICD-10-CM | POA: Diagnosis not present

## 2017-06-13 DIAGNOSIS — J449 Chronic obstructive pulmonary disease, unspecified: Secondary | ICD-10-CM | POA: Diagnosis not present

## 2017-06-13 DIAGNOSIS — Z79899 Other long term (current) drug therapy: Secondary | ICD-10-CM | POA: Diagnosis not present

## 2017-06-13 DIAGNOSIS — Z888 Allergy status to other drugs, medicaments and biological substances status: Secondary | ICD-10-CM | POA: Diagnosis not present

## 2017-06-13 DIAGNOSIS — Z87891 Personal history of nicotine dependence: Secondary | ICD-10-CM | POA: Diagnosis not present

## 2017-06-13 DIAGNOSIS — Z8546 Personal history of malignant neoplasm of prostate: Secondary | ICD-10-CM | POA: Diagnosis not present

## 2017-06-13 DIAGNOSIS — R7989 Other specified abnormal findings of blood chemistry: Secondary | ICD-10-CM | POA: Diagnosis not present

## 2017-06-13 DIAGNOSIS — R079 Chest pain, unspecified: Secondary | ICD-10-CM | POA: Diagnosis not present

## 2017-06-13 DIAGNOSIS — Z880 Allergy status to penicillin: Secondary | ICD-10-CM | POA: Diagnosis not present

## 2017-06-18 ENCOUNTER — Encounter (HOSPITAL_COMMUNITY): Payer: Medicare Other

## 2017-06-25 DIAGNOSIS — E782 Mixed hyperlipidemia: Secondary | ICD-10-CM | POA: Diagnosis not present

## 2017-06-25 DIAGNOSIS — E669 Obesity, unspecified: Secondary | ICD-10-CM | POA: Diagnosis not present

## 2017-06-25 DIAGNOSIS — E119 Type 2 diabetes mellitus without complications: Secondary | ICD-10-CM | POA: Diagnosis not present

## 2017-07-01 ENCOUNTER — Ambulatory Visit: Payer: Medicare Other | Admitting: Physician Assistant

## 2017-07-01 NOTE — Progress Notes (Deleted)
Cardiology Office Note    Date:  07/01/2017   ID:  [name], DOB July 26, 1951, MRN [id number]  PCP:  Jonathon Fuchs, PA  Cardiologist:  Jonathon. Stanford Breed   No chief complaint on file.   History of Present Illness:  [name] is a 66 y.o. male with PMH of COPD on home O2, diet controlled DM II, HTN, OSA, and post polio syndrome. Mildly in August 2015 was normal. Abdominal CT in April 2016 showed no aneurysm. Echocardiogram in September 2017 showed normal LV function, moderate LAE. He was admitted in September 2017 with atypical chest pain. He was seen by Jonathon. Stanford Breed and the eventually underwent myoview on 08/02/2016 which showed EF 61%, no ischemia or infarction, overall low risk study. Cardiac event monitor placed on 08/13/2016 shows sinus rhythm with occasional PVCs. More recent abdominal aorta ultrasound obtained on 02/13/2017 showed 2.6 cm abdominal aorta ectasia, recommended follow-up ultrasound in 5 years.  He presents today for 1 year follow-up.  Yes EKG  Past Medical History:  Diagnosis Date  . Anxiety disorder   . Arthritis    BILATERAL SHOULDERS, ELBOWS AND HANDS AND LEFT HIP AND KNEES--HX CORTISONE SHOTS IN SHOULDERS, ELBOWS, HIP AND KNEES  . Basal cell carcinoma of back 04/30/2016   Dermatologist- [name];   MOHS sx- Jonathon [name]   . Bladder outlet obstruction   . Chronic idiopathic constipation 05/08/2015  . Complication of anesthesia    DIFFICULT WAKING   . COPD (chronic obstructive pulmonary disease) Gold C Frequent exacerbations 01/13/2014   [name] 07/08/14: FeV1 51% FeV1/FVC 66% FVC 59% 10/5/2015ONO RA was normal  10/13/2014  ONO on RA NORMAL   . COPD, frequent exacerbations ([place])    pulmologist-  Jonathon [name]--  [name] Stage C.04-25-15 recent COPD exacerbation-much improved now, after tx. in ER [facility].  [name] Depression   . Diabetes mellitus without complication ([name])    BODERLINE - DIET CONTROL  . Dysrhythmia    PVC'S  . Emphysema lung ([name])    stage 2  .  Family history of adverse reaction to anesthesia    father would wake up with agitation   . Former smoker 01/13/2014  . Gastroesophageal reflux disease without esophagitis   . Heavy alcohol use 04/30/2016  . History of chronic bronchitis   . History of oxygen administration    oxygen use 2 l/m nasally at bedtime and exertional occasions  . History of rheumatic fever   . History of TB (tuberculosis)    1984--  hospitalized for 4 month treatment  . History of urinary retention   . Hx of multiple concussions    x 2 per patient   . Hypertension   . Melanoma ([place])   . Nocturnal oxygen desaturation    USES O2 NIGHTLY  . OSA (obstructive sleep apnea) 01/13/2014  . PONV (postoperative nausea and vomiting)   . Post-polio syndrome    polio at age 10--PT WAS IN IRON LUNG; PT WAS IN W/C UNTIL AGE 59; STILL HAS WEAKNESS RIGHT SIDE  . Prostate cancer ([name])   . Schizophrenia ([name])   . Tuberculosis    Hosp 4 months rx , left early   . Urticaria     Past Surgical History:  Procedure Laterality Date  . CARDIOVASCULAR STRESS TEST  06-08-2014  Jonathon Mare Ferrari   normal lexiscan study/  no ischemia/  not gated due to PAC's  . COLONOSCOPY N/A 05/03/2015   Procedure: COLONOSCOPY;  Surgeon: [name], MD;  Location: [facility];  Service: Endoscopy;  Laterality: N/A;  . CYSTOSCOPY N/A 10/25/2015   Procedure: CYSTOSCOPY;  Surgeon: [name], MD;  Location: [facility];  Service: Urology;  Laterality: N/A;  . CYSTOSCOPY W/ CYSTOGRAM/  TRANSRECTAL ULTRASOUND PROSTATE BX  03-22-2009  . ESOPHAGOGASTRODUODENOSCOPY N/A 03/22/2015   Procedure: ESOPHAGOGASTRODUODENOSCOPY (EGD) with dilation;  Surgeon: [name], MD;  Location: [facility];  Service: Endoscopy;  Laterality: N/A;  . excision of skin lesion    . LAPAROSCOPIC CHOLECYSTECTOMY  2013  . left elbow surgery      due to fracture   . NASAL SEPTUM SURGERY  2000  . OTHER SURGICAL HISTORY      Muscle & bone Graft/Polio  . polio surgeries      14 polio  surgeries   . PROSTATE BIOPSY N/A 09/28/2014   Procedure: PROSTATE ULTRASOUND/BIOPSY;  Surgeon: Jonathon So, MD;  Location: [facility];  Service: Urology;  Laterality: N/A;  . PROSTATE BIOPSY N/A 10/25/2015   Procedure: PROSTATE BIOPSY AND ULTRASOUND;  Surgeon: [name], MD;  Location: [facility];  Service: Urology;  Laterality: N/A;  . SAVORY DILATION N/A 03/22/2015   Procedure: SAVORY DILATION;  Surgeon: [name], MD;  Location: [facility];  Service: Endoscopy;  Laterality: N/A;  . SHOULDER ARTHROSCOPY WITH OPEN ROTATOR CUFF REPAIR Bilateral 2013  &  1999   removal spurs and bursectomy  . TRANSURETHRAL INCISION OF BLADDER NECK N/A 10/25/2015   Procedure:  TRANSURETHRAL INCISION OF BLADDER NECK;  Surgeon: [name], MD;  Location: [facility];  Service: Urology;  Laterality: N/A;  . TRANSURETHRAL RESECTION OF PROSTATE N/A 09/28/2014   Procedure: TRANSURETHRAL RESECTION OF THE PROSTATE (TURP);  Surgeon: Jonathon So, MD;  Location: [facility];  Service: Urology;  Laterality: N/A;  . URETEROSOPY STONE EXTRACTION  2000    Current Medications: Outpatient Medications Prior to Visit  Medication Sig Dispense Refill  . acetaminophen (TYLENOL) 500 MG tablet Take 500 mg by mouth every 6 (six) hours as needed for headache (pain).    [name] albuterol (PROVENTIL) (2.5 MG/3ML) 0.083% nebulizer solution Take 2.5 mg by nebulization every 6 (six) hours as needed for wheezing or shortness of breath.    [name] albuterol (VENTOLIN HFA) 108 (90 Base) MCG/ACT inhaler Inhale 2 puffs into the lungs every 4 (four) hours as needed for wheezing or shortness of breath.    . ALPRAZolam (XANAX) 1 MG tablet Take 0.5 tablets (0.5 mg total) by mouth 2 (two) times daily as needed for anxiety. (Patient taking differently: Take 0.5 mg by mouth 4 (four) times daily. ) 60 tablet 0  . arformoterol (BROVANA) 15 MCG/2ML NEBU Take 2 mLs (15 mcg total) by nebulization 2 (two) times daily. 120 mL 6  . baclofen (LIORESAL) 10 MG tablet Take 10 mg by mouth  at bedtime.    . bisoprolol (ZEBETA) 5 MG tablet TAKE 1 TABLET BY MOUTH DAILY 30 tablet 0  . budesonide (PULMICORT) 0.25 MG/2ML nebulizer solution Take 2 mLs (0.25 mg total) by nebulization 2 (two) times daily. 120 mL 6  . dexlansoprazole (DEXILANT) 60 MG capsule Take 1 capsule (60 mg total) by mouth daily. (Patient taking differently: Take 60 mg by mouth every other day. ) 90 capsule 1  . EPINEPHrine 0.3 mg/0.3 mL IJ SOAJ injection Inject 0.3 mLs (0.3 mg total) into the muscle once. (Patient taking differently: Inject 0.3 mg into the muscle once as needed (severe allergic reactions). ) 1 Device 1  . HYDROcodone-acetaminophen (NORCO/VICODIN) 5-325 MG  tablet Take 1 tablet by mouth every 6 (six) hours as needed for moderate pain.    [name] loratadine (CLARITIN) 10 MG tablet Take 10 mg by mouth daily.    . nitroGLYCERIN (NITROSTAT) 0.4 MG SL tablet Place 1 tablet (0.4 mg total) under the tongue every 5 (five) minutes as needed for chest pain. 30 tablet 3  . ondansetron (ZOFRAN ODT) 8 MG disintegrating tablet Take 1 tablet (8 mg total) by mouth every 8 (eight) hours as needed for nausea or vomiting. 10 tablet 0  . OXYGEN Inhale 2 L into the lungs at bedtime. Reported on 11/25/2015    . triamcinolone cream (KENALOG) 0.1 % Apply twice a day if needed to red itchy areas below the face to insect bites. 45 g 3   No facility-administered medications prior to visit.      Allergies:   Aspirin; Hornet venom; Ivp dye [iodinated diagnostic agents]; Levaquin [levofloxacin in d5w]; Nsaids; Penicillins; Tolmetin; Perforomist [formoterol]; Buprenorphine hcl; Morphine and related; and Oxycodone   Social History   Social History  . Marital status: Married    Spouse name: N/A  . Number of children: N/A  . Years of education: N/A   Occupational History  . Retired    Social History Main Topics  . Smoking status: Former Smoker    Years: 47.00    Types: Pipe    Quit date: 08/25/2013  . Smokeless tobacco: Former  Systems developer     Comment: 5-6 times smoking a pipe  . Alcohol use 8.4 oz/week    14 Cans of beer per week     Comment: 2 cans daily  . Drug use: No  . Sexual activity: Yes    Birth control/ protection: None   Other Topics Concern  . Not on file   Social History Narrative  . No narrative on file     Family History:  The patient's ***family history includes Alcohol abuse in his sister and sister; Alzheimer's disease in his paternal aunt and paternal uncle; Alzheimer's disease (age of onset: 81) in his father; Cancer in his mother; Diabetes in his maternal aunt and maternal uncle; Heart attack in his father; Heart disease in his father; Mental illness in his sister; Skin cancer in his mother; Stomach cancer in his father; Thyroid disease in his maternal aunt; Tuberculosis in his paternal grandfather and paternal grandmother.   ROS:   Please see the history of present illness.    ROS All other systems reviewed and are negative.   PHYSICAL EXAM:   VS:  There were no vitals taken for this visit.   GEN: Well nourished, well developed, in no acute distress  HEENT: normal  Neck: no JVD, carotid bruits, or masses Cardiac: ***RRR; no murmurs, rubs, or gallops,no edema  Respiratory:  clear to auscultation bilaterally, normal work of breathing GI: soft, nontender, nondistended, + BS MS: no deformity or atrophy  Skin: warm and dry, no rash Neuro:  Alert and Oriented x 3, Strength and sensation are intact Psych: euthymic mood, full affect  Wt Readings from Last 3 Encounters:  04/05/17 219 lb 9.3 oz (99.6 kg)  03/28/17 218 lb (98.9 kg)  03/14/17 218 lb 7.6 oz (99.1 kg)      Studies/Labs Reviewed:   EKG:  EKG is*** ordered today.  The ekg ordered today demonstrates ***  Recent Labs: 07/22/2016: Magnesium 2.2 11/04/2016: TSH 0.361 04/05/2017: ALT 37; BUN 22; Creatinine, Ser 1.31; Hemoglobin 14.2; Platelets 229; Potassium 4.7; Sodium 138   Lipid  Panel    Component Value Date/Time   CHOL 181  07/17/2016 1630   TRIG 245.0 (H) 07/17/2016 1630   HDL 48.00 07/17/2016 1630   CHOLHDL 4 07/17/2016 1630   VLDL 49.0 (H) 07/17/2016 1630   LDLCALC 150 (H) 05/07/2016 1024   LDLDIRECT 103.0 07/17/2016 1630    Additional studies/ records that were reviewed today include:   Myoview 08/02/2016 Study Highlights    Nuclear stress EF: 61%. Normal LV function .  There was no ST segment deviation noted during stress.  The study is normal. No ischemia . no evidence of infarction  This is a low risk study.      Cardiac event monitor 08/13/2016 Sinus with occasional PVC    ABD [name] 02/2017 FINDINGS: Maximum Diameter: 2.6 cm  ABDOMINAL AORTA  Proximal:  2.6 cm  Mid:  2.1 cm  Distal:  1.7 cm  IMPRESSION: 2.6 cm abdominal aortic ectasia. Ectatic abdominal aorta at risk for aneurysm development. Recommend followup by ultrasound in 5 years.   ASSESSMENT:    No diagnosis found.   PLAN:  In order of problems listed above:  1. ***    Medication Adjustments/Labs and Tests Ordered: Current medicines are reviewed at length with the patient today.  Concerns regarding medicines are outlined above.  Medication changes, Labs and Tests ordered today are listed in the Patient Instructions below. There are no Patient Instructions on file for this visit.   Hilbert Corrigan, [place]  07/01/2017 6:33 AM    [address], [place], [place]  [address] Phone: [phone]; Fax: [phone]

## 2017-07-08 DIAGNOSIS — M1712 Unilateral primary osteoarthritis, left knee: Secondary | ICD-10-CM | POA: Diagnosis not present

## 2017-07-08 DIAGNOSIS — M25562 Pain in left knee: Secondary | ICD-10-CM | POA: Diagnosis not present

## 2017-07-11 ENCOUNTER — Ambulatory Visit: Payer: Medicare Other | Admitting: Physician Assistant

## 2017-07-11 NOTE — Progress Notes (Deleted)
Cardiology Office Note    Date:  07/11/2017   ID:  [name], DOB [birth year], MRN [id number]  PCP:  Rich Fuchs, PA  Cardiologist:  Dr. Stanford Breed  No chief complaint on file.   History of Present Illness:  [name] is a 66 y.o. male with PMH of COPD, HTN and died controlled DM. Myoview in 2015 was normal. Abdominal CT April 2016 showed no aneurysm. Echocardiogram in September 2017 showed normal LV systolic function, moderate LAE. He was admitted in September 2017 with atypical chest pain and ruled out with negative enzymes. He has been having occasional chest pain for the past 5 years. Last Myoview in October 2017 showed EF 61%, no ischemia or evidence of infarction, overall low risk study.    Past Medical History:  Diagnosis Date  . Anxiety disorder   . Arthritis    BILATERAL SHOULDERS, ELBOWS AND HANDS AND LEFT HIP AND KNEES--HX CORTISONE SHOTS IN SHOULDERS, ELBOWS, HIP AND KNEES  . Basal cell carcinoma of back 04/30/2016   Dermatologist- Dr Nevada Crane;   MOHS sx- Dr Levada Dy   . Bladder outlet obstruction   . Chronic idiopathic constipation 05/08/2015  . Complication of anesthesia    DIFFICULT WAKING   . COPD (chronic obstructive pulmonary disease) Gold C Frequent exacerbations 01/13/2014   [name] 07/08/14: FeV1 51% FeV1/FVC 66% FVC 59% 10/5/2015ONO RA was normal  10/13/2014  ONO on RA NORMAL   . COPD, frequent exacerbations ([place])    pulmologist-  dr Joya Gaskins--  [place] Rm Stage C.04-25-15 recent COPD exacerbation-much improved now, after tx. in ER [facility].  Marland Kitchen Depression   . Diabetes mellitus without complication ([place])    BODERLINE - DIET CONTROL  . Dysrhythmia    PVC'S  . Emphysema lung ([place])    stage 2  . Family history of adverse reaction to anesthesia    father would wake up with agitation   . Former smoker 01/13/2014  . Gastroesophageal reflux disease without esophagitis   . Heavy alcohol use 04/30/2016  . History of chronic bronchitis   . History of oxygen  administration    oxygen use 2 l/m nasally at bedtime and exertional occasions  . History of rheumatic fever   . History of TB (tuberculosis)    1984--  hospitalized for 4 month treatment  . History of urinary retention   . Hx of multiple concussions    x 2 per patient   . Hypertension   . Melanoma ([place])   . Nocturnal oxygen desaturation    USES O2 NIGHTLY  . OSA (obstructive sleep apnea) 01/13/2014  . PONV (postoperative nausea and vomiting)   . Post-polio syndrome    polio at age 33--PT WAS IN IRON LUNG; PT WAS IN W/C UNTIL AGE 29; STILL HAS WEAKNESS RIGHT SIDE  . Prostate cancer ([place])   . Schizophrenia ([place])   . Tuberculosis    Hosp 4 months rx , left early   . Urticaria     Past Surgical History:  Procedure Laterality Date  . CARDIOVASCULAR STRESS TEST  06-08-2014  dr Mare Ferrari   normal lexiscan study/  no ischemia/  not gated due to PAC's  . COLONOSCOPY N/A 05/03/2015   Procedure: COLONOSCOPY;  Surgeon: [name], MD;  Location: [facility];  Service: Endoscopy;  Laterality: N/A;  . CYSTOSCOPY N/A 10/25/2015   Procedure: CYSTOSCOPY;  Surgeon: [name], MD;  Location: [facility];  Service: Urology;  Laterality: N/A;  . CYSTOSCOPY W/ CYSTOGRAM/  TRANSRECTAL  ULTRASOUND PROSTATE BX  03-22-2009  . ESOPHAGOGASTRODUODENOSCOPY N/A 03/22/2015   Procedure: ESOPHAGOGASTRODUODENOSCOPY (EGD) with dilation;  Surgeon: [name], MD;  Location: [facility];  Service: Endoscopy;  Laterality: N/A;  . excision of skin lesion    . LAPAROSCOPIC CHOLECYSTECTOMY  2013  . left elbow surgery      due to fracture   . NASAL SEPTUM SURGERY  2000  . OTHER SURGICAL HISTORY      Muscle & bone Graft/Polio  . polio surgeries      14 polio surgeries   . PROSTATE BIOPSY N/A 09/28/2014   Procedure: PROSTATE ULTRASOUND/BIOPSY;  Surgeon: [name], MD;  Location: [facility];  Service: Urology;  Laterality: N/A;  . PROSTATE BIOPSY N/A 10/25/2015   Procedure: PROSTATE BIOPSY AND ULTRASOUND;  Surgeon: [name], MD;  Location: [facility];  Service: Urology;  Laterality: N/A;  . SAVORY DILATION N/A 03/22/2015   Procedure: SAVORY DILATION;  Surgeon: [name], MD;  Location: [facility];  Service: Endoscopy;  Laterality: N/A;  . SHOULDER ARTHROSCOPY WITH OPEN ROTATOR CUFF REPAIR Bilateral 2013  &  1999   removal spurs and bursectomy  . TRANSURETHRAL INCISION OF BLADDER NECK N/A 10/25/2015   Procedure:  TRANSURETHRAL INCISION OF BLADDER NECK;  Surgeon: [name], MD;  Location: [facility];  Service: Urology;  Laterality: N/A;  . TRANSURETHRAL RESECTION OF PROSTATE N/A 09/28/2014   Procedure: TRANSURETHRAL RESECTION OF THE PROSTATE (TURP);  Surgeon: [name], MD;  Location: [facility];  Service: Urology;  Laterality: N/A;  . URETEROSOPY STONE EXTRACTION  2000    Current Medications: Outpatient Medications Prior to Visit  Medication Sig Dispense Refill  . acetaminophen (TYLENOL) 500 MG tablet Take 500 mg by mouth every 6 (six) hours as needed for headache (pain).    Marland Kitchen albuterol (PROVENTIL) (2.5 MG/3ML) 0.083% nebulizer solution Take 2.5 mg by nebulization every 6 (six) hours as needed for wheezing or shortness of breath.    Marland Kitchen albuterol (VENTOLIN HFA) 108 (90 Base) MCG/ACT inhaler Inhale 2 puffs into the lungs every 4 (four) hours as needed for wheezing or shortness of breath.    . ALPRAZolam (XANAX) 1 MG tablet Take 0.5 tablets (0.5 mg total) by mouth 2 (two) times daily as needed for anxiety. (Patient taking differently: Take 0.5 mg by mouth 4 (four) times daily. ) 60 tablet 0  . arformoterol (BROVANA) 15 MCG/2ML NEBU Take 2 mLs (15 mcg total) by nebulization 2 (two) times daily. 120 mL 6  . baclofen (LIORESAL) 10 MG tablet Take 10 mg by mouth at bedtime.    . bisoprolol (ZEBETA) 5 MG tablet TAKE 1 TABLET BY MOUTH DAILY 30 tablet 0  . budesonide (PULMICORT) 0.25 MG/2ML nebulizer solution Take 2 mLs (0.25 mg total) by nebulization 2 (two) times daily. 120 mL 6  . dexlansoprazole (DEXILANT) 60 MG capsule  Take 1 capsule (60 mg total) by mouth daily. (Patient taking differently: Take 60 mg by mouth every other day. ) 90 capsule 1  . EPINEPHrine 0.3 mg/0.3 mL IJ SOAJ injection Inject 0.3 mLs (0.3 mg total) into the muscle once. (Patient taking differently: Inject 0.3 mg into the muscle once as needed (severe allergic reactions). ) 1 Device 1  . HYDROcodone-acetaminophen (NORCO/VICODIN) 5-325 MG tablet Take 1 tablet by mouth every 6 (six) hours as needed for moderate pain.    Marland Kitchen loratadine (CLARITIN) 10 MG tablet Take 10 mg by mouth daily.    . nitroGLYCERIN (NITROSTAT) 0.4 MG SL tablet  Place 1 tablet (0.4 mg total) under the tongue every 5 (five) minutes as needed for chest pain. 30 tablet 3  . ondansetron (ZOFRAN ODT) 8 MG disintegrating tablet Take 1 tablet (8 mg total) by mouth every 8 (eight) hours as needed for nausea or vomiting. 10 tablet 0  . OXYGEN Inhale 2 L into the lungs at bedtime. Reported on 11/25/2015    . triamcinolone cream (KENALOG) 0.1 % Apply twice a day if needed to red itchy areas below the face to insect bites. 45 g 3   No facility-administered medications prior to visit.      Allergies:   Aspirin; Hornet venom; Ivp dye [iodinated diagnostic agents]; Levaquin [levofloxacin in d5w]; Nsaids; Penicillins; Tolmetin; Perforomist [formoterol]; Buprenorphine hcl; Morphine and related; and Oxycodone   Social History   Social History  . Marital status: Married    Spouse name: N/A  . Number of children: N/A  . Years of education: N/A   Occupational History  . Retired    Social History Main Topics  . Smoking status: Former Smoker    Years: 47.00    Types: Pipe    Quit date: 08/25/2013  . Smokeless tobacco: Former Systems developer     Comment: 5-6 times smoking a pipe  . Alcohol use 8.4 oz/week    14 Cans of beer per week     Comment: 2 cans daily  . Drug use: No  . Sexual activity: Yes    Birth control/ protection: None   Other Topics Concern  . Not on file   Social History  Narrative  . No narrative on file     Family History:  The patient's ***family history includes Alcohol abuse in his sister and sister; Alzheimer's disease in his paternal aunt and paternal uncle; Alzheimer's disease (age of onset: 80) in his father; Cancer in his mother; Diabetes in his maternal aunt and maternal uncle; Heart attack in his father; Heart disease in his father; Mental illness in his sister; Skin cancer in his mother; Stomach cancer in his father; Thyroid disease in his maternal aunt; Tuberculosis in his paternal grandfather and paternal grandmother.   ROS:   Please see the history of present illness.    ROS All other systems reviewed and are negative.   PHYSICAL EXAM:   VS:  There were no vitals taken for this visit.   GEN: Well nourished, well developed, in no acute distress  HEENT: normal  Neck: no JVD, carotid bruits, or masses Cardiac: ***RRR; no murmurs, rubs, or gallops,no edema  Respiratory:  clear to auscultation bilaterally, normal work of breathing GI: soft, nontender, nondistended, + BS MS: no deformity or atrophy  Skin: warm and dry, no rash Neuro:  Alert and Oriented x 3, Strength and sensation are intact Psych: euthymic mood, full affect  Wt Readings from Last 3 Encounters:  04/05/17 219 lb 9.3 oz (99.6 kg)  03/28/17 218 lb (98.9 kg)  03/14/17 218 lb 7.6 oz (99.1 kg)      Studies/Labs Reviewed:   EKG:  EKG is*** ordered today.  The ekg ordered today demonstrates ***  Recent Labs: 07/22/2016: Magnesium 2.2 11/04/2016: TSH 0.361 04/05/2017: ALT 37; BUN 22; Creatinine, Ser 1.31; Hemoglobin 14.2; Platelets 229; Potassium 4.7; Sodium 138   Lipid Panel    Component Value Date/Time   CHOL 181 07/17/2016 1630   TRIG 245.0 (H) 07/17/2016 1630   HDL 48.00 07/17/2016 1630   CHOLHDL 4 07/17/2016 1630   VLDL 49.0 (H) 07/17/2016 1630  [place] 150 (H) 05/07/2016 1024   LDLDIRECT 103.0 07/17/2016 1630    Additional studies/ records that were reviewed  today include:   Echo 06/26/2016 LV EF: 55% -   60%  Study Conclusions  - Left ventricle: The cavity size was normal. Systolic function was   normal. The estimated ejection fraction was in the range of 55%   to 60%. Wall motion was normal; there were no regional wall   motion abnormalities. Left ventricular diastolic function   parameters were normal. - Left atrium: The atrium was moderately dilated. - Atrial septum: No defect or patent foramen ovale was identified.    Myoview 08/02/2016 Study Highlights    Nuclear stress EF: 61%. Normal LV function .  There was no ST segment deviation noted during stress.  The study is normal. No ischemia . no evidence of infarction  This is a low risk study.      ASSESSMENT:    No diagnosis found.   PLAN:  In order of problems listed above:  1. ***    Medication Adjustments/Labs and Tests Ordered: Current medicines are reviewed at length with the patient today.  Concerns regarding medicines are outlined above.  Medication changes, Labs and Tests ordered today are listed in the Patient Instructions below. There are no Patient Instructions on file for this visit.   Hilbert Corrigan, [place]  07/11/2017 6:27 AM    [address], [place], [place]  [address] Phone: [phone]; Fax: [phone]

## 2017-07-12 ENCOUNTER — Encounter: Payer: Self-pay | Admitting: *Deleted

## 2017-07-20 DIAGNOSIS — J45909 Unspecified asthma, uncomplicated: Secondary | ICD-10-CM | POA: Diagnosis not present

## 2017-07-20 DIAGNOSIS — M19042 Primary osteoarthritis, left hand: Secondary | ICD-10-CM | POA: Diagnosis not present

## 2017-07-20 DIAGNOSIS — M7989 Other specified soft tissue disorders: Secondary | ICD-10-CM | POA: Diagnosis not present

## 2017-07-20 DIAGNOSIS — Z883 Allergy status to other anti-infective agents status: Secondary | ICD-10-CM | POA: Diagnosis not present

## 2017-07-20 DIAGNOSIS — Z8546 Personal history of malignant neoplasm of prostate: Secondary | ICD-10-CM | POA: Diagnosis not present

## 2017-07-20 DIAGNOSIS — Z87891 Personal history of nicotine dependence: Secondary | ICD-10-CM | POA: Diagnosis not present

## 2017-07-20 DIAGNOSIS — Z888 Allergy status to other drugs, medicaments and biological substances status: Secondary | ICD-10-CM | POA: Diagnosis not present

## 2017-07-20 DIAGNOSIS — I10 Essential (primary) hypertension: Secondary | ICD-10-CM | POA: Diagnosis not present

## 2017-07-20 DIAGNOSIS — R2232 Localized swelling, mass and lump, left upper limb: Secondary | ICD-10-CM | POA: Diagnosis not present

## 2017-07-20 DIAGNOSIS — Z886 Allergy status to analgesic agent status: Secondary | ICD-10-CM | POA: Diagnosis not present

## 2017-07-20 DIAGNOSIS — F419 Anxiety disorder, unspecified: Secondary | ICD-10-CM | POA: Diagnosis not present

## 2017-07-20 DIAGNOSIS — Z880 Allergy status to penicillin: Secondary | ICD-10-CM | POA: Diagnosis not present

## 2017-07-20 DIAGNOSIS — M79642 Pain in left hand: Secondary | ICD-10-CM | POA: Diagnosis not present

## 2017-07-20 DIAGNOSIS — K219 Gastro-esophageal reflux disease without esophagitis: Secondary | ICD-10-CM | POA: Diagnosis not present

## 2017-07-20 DIAGNOSIS — Z91041 Radiographic dye allergy status: Secondary | ICD-10-CM | POA: Diagnosis not present

## 2017-07-20 DIAGNOSIS — Z79899 Other long term (current) drug therapy: Secondary | ICD-10-CM | POA: Diagnosis not present

## 2017-07-20 DIAGNOSIS — Z91030 Bee allergy status: Secondary | ICD-10-CM | POA: Diagnosis not present

## 2017-07-20 DIAGNOSIS — G4733 Obstructive sleep apnea (adult) (pediatric): Secondary | ICD-10-CM | POA: Diagnosis not present

## 2017-07-25 DIAGNOSIS — J441 Chronic obstructive pulmonary disease with (acute) exacerbation: Secondary | ICD-10-CM | POA: Diagnosis not present

## 2017-07-25 DIAGNOSIS — I10 Essential (primary) hypertension: Secondary | ICD-10-CM | POA: Diagnosis not present

## 2017-07-26 DIAGNOSIS — M1812 Unilateral primary osteoarthritis of first carpometacarpal joint, left hand: Secondary | ICD-10-CM | POA: Diagnosis not present

## 2017-08-01 DIAGNOSIS — M1812 Unilateral primary osteoarthritis of first carpometacarpal joint, left hand: Secondary | ICD-10-CM | POA: Diagnosis not present

## 2017-08-01 DIAGNOSIS — M65842 Other synovitis and tenosynovitis, left hand: Secondary | ICD-10-CM | POA: Diagnosis not present

## 2017-08-08 DIAGNOSIS — J45909 Unspecified asthma, uncomplicated: Secondary | ICD-10-CM | POA: Diagnosis not present

## 2017-08-08 DIAGNOSIS — I10 Essential (primary) hypertension: Secondary | ICD-10-CM | POA: Diagnosis not present

## 2017-08-08 DIAGNOSIS — G4719 Other hypersomnia: Secondary | ICD-10-CM | POA: Diagnosis not present

## 2017-08-08 DIAGNOSIS — J449 Chronic obstructive pulmonary disease, unspecified: Secondary | ICD-10-CM | POA: Diagnosis not present

## 2017-08-08 DIAGNOSIS — R0683 Snoring: Secondary | ICD-10-CM | POA: Diagnosis not present

## 2017-08-20 DIAGNOSIS — Z0000 Encounter for general adult medical examination without abnormal findings: Secondary | ICD-10-CM | POA: Diagnosis not present

## 2017-08-20 DIAGNOSIS — I10 Essential (primary) hypertension: Secondary | ICD-10-CM | POA: Diagnosis not present

## 2017-09-03 DIAGNOSIS — J4550 Severe persistent asthma, uncomplicated: Secondary | ICD-10-CM | POA: Diagnosis not present

## 2017-09-09 DIAGNOSIS — J449 Chronic obstructive pulmonary disease, unspecified: Secondary | ICD-10-CM | POA: Diagnosis not present

## 2017-09-09 DIAGNOSIS — J45909 Unspecified asthma, uncomplicated: Secondary | ICD-10-CM | POA: Diagnosis not present

## 2017-09-09 DIAGNOSIS — I10 Essential (primary) hypertension: Secondary | ICD-10-CM | POA: Diagnosis not present

## 2017-09-09 DIAGNOSIS — J309 Allergic rhinitis, unspecified: Secondary | ICD-10-CM | POA: Diagnosis not present

## 2017-09-26 DIAGNOSIS — Z23 Encounter for immunization: Secondary | ICD-10-CM | POA: Diagnosis not present

## 2017-10-09 DIAGNOSIS — J209 Acute bronchitis, unspecified: Secondary | ICD-10-CM | POA: Diagnosis not present

## 2017-10-09 DIAGNOSIS — I10 Essential (primary) hypertension: Secondary | ICD-10-CM | POA: Diagnosis not present

## 2017-10-09 DIAGNOSIS — J449 Chronic obstructive pulmonary disease, unspecified: Secondary | ICD-10-CM | POA: Diagnosis not present

## 2017-10-09 DIAGNOSIS — J309 Allergic rhinitis, unspecified: Secondary | ICD-10-CM | POA: Diagnosis not present

## 2017-10-09 DIAGNOSIS — J45909 Unspecified asthma, uncomplicated: Secondary | ICD-10-CM | POA: Diagnosis not present

## 2017-10-10 DIAGNOSIS — M19011 Primary osteoarthritis, right shoulder: Secondary | ICD-10-CM | POA: Diagnosis not present

## 2017-10-10 DIAGNOSIS — E782 Mixed hyperlipidemia: Secondary | ICD-10-CM | POA: Diagnosis not present

## 2017-10-10 DIAGNOSIS — M25511 Pain in right shoulder: Secondary | ICD-10-CM | POA: Diagnosis not present

## 2017-10-10 DIAGNOSIS — E119 Type 2 diabetes mellitus without complications: Secondary | ICD-10-CM | POA: Diagnosis not present

## 2017-10-10 DIAGNOSIS — I10 Essential (primary) hypertension: Secondary | ICD-10-CM | POA: Diagnosis not present

## 2017-10-10 DIAGNOSIS — Z1159 Encounter for screening for other viral diseases: Secondary | ICD-10-CM | POA: Diagnosis not present

## 2017-10-10 DIAGNOSIS — E559 Vitamin D deficiency, unspecified: Secondary | ICD-10-CM | POA: Diagnosis not present

## 2017-10-16 ENCOUNTER — Emergency Department (HOSPITAL_COMMUNITY): Payer: Medicare Other

## 2017-10-16 ENCOUNTER — Emergency Department (HOSPITAL_COMMUNITY)
Admission: EM | Admit: 2017-10-16 | Discharge: 2017-10-17 | Disposition: A | Discharge status: Home / Self Care | Payer: Medicare Other | Attending: Emergency Medicine | Admitting: Emergency Medicine

## 2017-10-16 ENCOUNTER — Encounter (HOSPITAL_COMMUNITY): Payer: Self-pay | Admitting: Emergency Medicine

## 2017-10-16 DIAGNOSIS — Y9301 Activity, walking, marching and hiking: Secondary | ICD-10-CM | POA: Diagnosis not present

## 2017-10-16 DIAGNOSIS — Z79899 Other long term (current) drug therapy: Secondary | ICD-10-CM | POA: Diagnosis not present

## 2017-10-16 DIAGNOSIS — F1014 Alcohol abuse with alcohol-induced mood disorder: Secondary | ICD-10-CM | POA: Diagnosis present

## 2017-10-16 DIAGNOSIS — E1165 Type 2 diabetes mellitus with hyperglycemia: Secondary | ICD-10-CM | POA: Diagnosis not present

## 2017-10-16 DIAGNOSIS — N189 Chronic kidney disease, unspecified: Secondary | ICD-10-CM | POA: Diagnosis not present

## 2017-10-16 DIAGNOSIS — X58XXXA Exposure to other specified factors, initial encounter: Secondary | ICD-10-CM | POA: Diagnosis not present

## 2017-10-16 DIAGNOSIS — G8929 Other chronic pain: Secondary | ICD-10-CM

## 2017-10-16 DIAGNOSIS — S0993XA Unspecified injury of face, initial encounter: Secondary | ICD-10-CM | POA: Diagnosis not present

## 2017-10-16 DIAGNOSIS — Y999 Unspecified external cause status: Secondary | ICD-10-CM | POA: Insufficient documentation

## 2017-10-16 DIAGNOSIS — Z87891 Personal history of nicotine dependence: Secondary | ICD-10-CM | POA: Diagnosis not present

## 2017-10-16 DIAGNOSIS — J449 Chronic obstructive pulmonary disease, unspecified: Secondary | ICD-10-CM | POA: Insufficient documentation

## 2017-10-16 DIAGNOSIS — S0033XA Contusion of nose, initial encounter: Secondary | ICD-10-CM | POA: Diagnosis not present

## 2017-10-16 DIAGNOSIS — Y92000 Kitchen of unspecified non-institutional (private) residence as  the place of occurrence of the external cause: Secondary | ICD-10-CM | POA: Diagnosis not present

## 2017-10-16 DIAGNOSIS — J4550 Severe persistent asthma, uncomplicated: Secondary | ICD-10-CM | POA: Diagnosis not present

## 2017-10-16 DIAGNOSIS — F10920 Alcohol use, unspecified with intoxication, uncomplicated: Secondary | ICD-10-CM | POA: Insufficient documentation

## 2017-10-16 DIAGNOSIS — M25512 Pain in left shoulder: Secondary | ICD-10-CM | POA: Insufficient documentation

## 2017-10-16 DIAGNOSIS — I129 Hypertensive chronic kidney disease with stage 1 through stage 4 chronic kidney disease, or unspecified chronic kidney disease: Secondary | ICD-10-CM | POA: Insufficient documentation

## 2017-10-16 DIAGNOSIS — R45851 Suicidal ideations: Secondary | ICD-10-CM | POA: Insufficient documentation

## 2017-10-16 DIAGNOSIS — R0602 Shortness of breath: Secondary | ICD-10-CM | POA: Diagnosis not present

## 2017-10-16 DIAGNOSIS — S0990XA Unspecified injury of head, initial encounter: Secondary | ICD-10-CM | POA: Diagnosis not present

## 2017-10-16 LAB — COMPREHENSIVE METABOLIC PANEL
ALT: 32 U/L (ref 17–63)
AST: 23 U/L (ref 15–41)
Albumin: 4.3 g/dL (ref 3.5–5.0)
Alkaline Phosphatase: 47 U/L (ref 38–126)
Anion gap: 7 (ref 5–15)
BILIRUBIN TOTAL: 0.5 mg/dL (ref 0.3–1.2)
BUN: 18 mg/dL (ref 6–20)
CO2: 23 mmol/L (ref 22–32)
CREATININE: 1.27 mg/dL — AB (ref 0.61–1.24)
Calcium: 9 mg/dL (ref 8.9–10.3)
Chloride: 101 mmol/L (ref 101–111)
GFR calc Af Amer: >60 mL/min (ref >60)
GFR, EST NON AFRICAN AMERICAN: 57 mL/min — AB (ref >60)
Glucose, Bld: 146 mg/dL — ABNORMAL HIGH (ref 65–99)
Potassium: 4.5 mmol/L (ref 3.5–5.1)
Sodium: 131 mmol/L — ABNORMAL LOW (ref 135–145)
TOTAL PROTEIN: 7.6 g/dL (ref 6.5–8.1)

## 2017-10-16 LAB — CBC
HCT: 40.7 % (ref 39.0–52.0)
Hemoglobin: 14.3 g/dL (ref 13.0–17.0)
MCH: 32.9 pg (ref 26.0–34.0)
MCHC: 35.1 g/dL (ref 30.0–36.0)
MCV: 93.8 fL (ref 78.0–100.0)
Platelets: 300 10*3/uL (ref 150–400)
RBC: 4.34 MIL/uL (ref 4.22–5.81)
RDW: 12.8 % (ref 11.5–15.5)
WBC: 9.8 10*3/uL (ref 4.0–10.5)

## 2017-10-16 LAB — I-STAT TROPONIN, ED: Troponin i, poc: 0 ng/mL (ref 0.00–0.08)

## 2017-10-16 LAB — ETHANOL: ALCOHOL ETHYL (B): 194 mg/dL — AB (ref <10)

## 2017-10-16 LAB — RAPID URINE DRUG SCREEN, HOSP PERFORMED
Amphetamines: NOT DETECTED
BARBITURATES: NOT DETECTED
Benzodiazepines: NOT DETECTED
COCAINE: NOT DETECTED
OPIATES: NOT DETECTED
Tetrahydrocannabinol: NOT DETECTED

## 2017-10-16 LAB — ACETAMINOPHEN LEVEL: Acetaminophen (Tylenol), Serum: <10 ug/mL — ABNORMAL LOW (ref 10–30)

## 2017-10-16 LAB — SALICYLATE LEVEL: Salicylate Lvl: <7 mg/dL (ref 2.8–30.0)

## 2017-10-16 MED ORDER — VITAMIN D (ERGOCALCIFEROL) 1.25 MG (50000 UNIT) PO CAPS
50000.0000 [IU] | ORAL_CAPSULE | ORAL | Status: DC
  Administered 2017-10-16: 50000 [IU] via ORAL
  Filled 2017-10-16: qty 1

## 2017-10-16 MED ORDER — ACETAMINOPHEN 500 MG PO TABS: 500.0000 mg | ORAL_TABLET | Freq: Four times a day (QID) | ORAL | Status: DC | PRN

## 2017-10-16 MED ORDER — ZOLPIDEM TARTRATE 5 MG PO TABS
5.0000 mg | ORAL_TABLET | Freq: Every evening | ORAL | Status: DC | PRN
  Administered 2017-10-16: 5 mg via ORAL
  Filled 2017-10-16: qty 1

## 2017-10-16 MED ORDER — ALPRAZOLAM 0.5 MG PO TABS
0.5000 mg | ORAL_TABLET | Freq: Two times a day (BID) | ORAL | Status: DC | PRN
  Administered 2017-10-17: 0.5 mg via ORAL
  Filled 2017-10-16: qty 1

## 2017-10-16 MED ORDER — LORAZEPAM 2 MG/ML IJ SOLN: 0.0000 mg | Freq: Four times a day (QID) | INTRAMUSCULAR | Status: DC

## 2017-10-16 MED ORDER — PREDNISONE 2.5 MG PO TABS
2.5000 mg | ORAL_TABLET | Freq: Every day | ORAL | Status: DC
  Administered 2017-10-17: 2.5 mg via ORAL
  Filled 2017-10-16: qty 1

## 2017-10-16 MED ORDER — THIAMINE HCL 100 MG/ML IJ SOLN: 100.0000 mg | Freq: Every day | INTRAMUSCULAR | Status: DC

## 2017-10-16 MED ORDER — PANTOPRAZOLE SODIUM 40 MG PO TBEC
40.0000 mg | DELAYED_RELEASE_TABLET | Freq: Every day | ORAL | Status: DC
  Administered 2017-10-16 – 2017-10-17 (×2): 40 mg via ORAL
  Filled 2017-10-16 (×2): qty 1

## 2017-10-16 MED ORDER — BUDESONIDE 0.25 MG/2ML IN SUSP
0.2500 mg | Freq: Two times a day (BID) | RESPIRATORY_TRACT | Status: DC
  Administered 2017-10-16 – 2017-10-17 (×2): 0.25 mg via RESPIRATORY_TRACT
  Filled 2017-10-16 (×5): qty 2

## 2017-10-16 MED ORDER — LORAZEPAM 1 MG PO TABS: 0.0000 mg | ORAL_TABLET | Freq: Two times a day (BID) | ORAL | Status: DC

## 2017-10-16 MED ORDER — BISOPROLOL FUMARATE 5 MG PO TABS
5.0000 mg | ORAL_TABLET | Freq: Every day | ORAL | Status: DC
  Administered 2017-10-17: 5 mg via ORAL
  Filled 2017-10-16: qty 1

## 2017-10-16 MED ORDER — ALUM & MAG HYDROXIDE-SIMETH 200-200-20 MG/5ML PO SUSP: 30.0000 mL | Freq: Four times a day (QID) | ORAL | Status: DC | PRN

## 2017-10-16 MED ORDER — ALBUTEROL SULFATE HFA 108 (90 BASE) MCG/ACT IN AERS
2.0000 | INHALATION_SPRAY | RESPIRATORY_TRACT | Status: DC | PRN
  Administered 2017-10-16: 2 via RESPIRATORY_TRACT
  Filled 2017-10-16 (×2): qty 6.7

## 2017-10-16 MED ORDER — VITAMIN B-1 100 MG PO TABS
100.0000 mg | ORAL_TABLET | Freq: Every day | ORAL | Status: DC
  Administered 2017-10-16 – 2017-10-17 (×2): 100 mg via ORAL
  Filled 2017-10-16 (×2): qty 1

## 2017-10-16 MED ORDER — ONDANSETRON 8 MG PO TBDP
8.0000 mg | ORAL_TABLET | Freq: Three times a day (TID) | ORAL | Status: DC | PRN
  Administered 2017-10-17: 8 mg via ORAL
  Filled 2017-10-16: qty 1

## 2017-10-16 MED ORDER — LORAZEPAM 2 MG/ML IJ SOLN: 0.0000 mg | Freq: Two times a day (BID) | INTRAMUSCULAR | Status: DC

## 2017-10-16 MED ORDER — LORAZEPAM 1 MG PO TABS: 0.0000 mg | ORAL_TABLET | Freq: Four times a day (QID) | ORAL | Status: DC

## 2017-10-16 MED ORDER — NITROGLYCERIN 0.4 MG SL SUBL: 0.4000 mg | SUBLINGUAL_TABLET | SUBLINGUAL | Status: DC | PRN

## 2017-10-16 MED ORDER — ARFORMOTEROL TARTRATE 15 MCG/2ML IN NEBU
15.0000 ug | INHALATION_SOLUTION | Freq: Two times a day (BID) | RESPIRATORY_TRACT | Status: DC
  Administered 2017-10-17: 15 ug via RESPIRATORY_TRACT
  Filled 2017-10-16 (×2): qty 2

## 2017-10-16 MED ORDER — BACLOFEN 10 MG PO TABS
10.0000 mg | ORAL_TABLET | Freq: Every day | ORAL | Status: DC
  Administered 2017-10-16: 10 mg via ORAL
  Filled 2017-10-16: qty 1

## 2017-10-16 NOTE — ED Notes (Signed)
Bed: CN63 Expected date:  Expected time:  Means of arrival:  Comments: EMS-ETOH

## 2017-10-16 NOTE — ED Notes (Signed)
Pt stated "I drink 3 six packs a day.  I'm retired.  Who is going to hire a drunk."

## 2017-10-16 NOTE — ED Notes (Signed)
RT in with pt.

## 2017-10-16 NOTE — ED Notes (Signed)
Patient transported to CT 

## 2017-10-16 NOTE — ED Notes (Signed)
TTS assessment in progress. 

## 2017-10-16 NOTE — ED Provider Notes (Signed)
[facility] DEPT Provider Note   CSN: [id number] Arrival date & time: 10/16/17  1808     History   Chief Complaint Chief Complaint  Patient presents with  . Alcohol Intoxication  . Suicidal    HPI [name] is a 67 y.o. male with a PMHx of anxiety, depression, alcoholism, diffuse arthritis, chronic constipation, COPD, DM2, HTN, and other conditions listed below, who presents to the ED via EMS for evaluation of intoxication and suicidal ideations. LEVEL 5 CAVEAT DUE TO INTOXICATION.  Per EMS, the patient called out for a fall stating that he had left arm/shoulder pain and nose pain, however upon their arrival he refused transport but then began threatening to shoot himself and became aggressive.  Patient reports that he was walking into his kitchen today and felt a sharp pain in his left shoulder/arm that "made him go down" to the ground and "took his breath away", however quickly resolved and upon arrival he states he has absolutely no left arm or shoulder pain.  He states that he was bleeding from his nose after he fell, but this has also resolved.  He admits to drinking two 16oz beers today and taking a xanax, sometime around 4pm (~2hrs prior to arrival).  He states that over the last several days he has been depressed and feeling suicidal, but denies having a plan.  He states that he has guns at home "but has not used them yet".  He repeatedly denies having a plan to harm himself.  He denies having HI or AVH, denies drug use, or tobacco use.  He also denies feeling lightheaded or diaphoretic prior to falling, and denies LOC.  He is very tangential and difficult to follow when trying to get a history, therefore his history is very limited.  He denies having any fevers recently, CP, abdominal pain, nausea, vomiting, diarrhea, constipation, dysuria, hematuria, numbness, tingling, or focal weakness, however the accuracy of these statements is difficult to assess  due to his likely intoxicated state.  He reports that his cardiologist is Dr. Stanford Breed and his PCP is Dr. Annamaria Boots.  He sees an orthopedist at [facility] for his chronic shoulder pain and arthritis.    The history is provided by the patient, medical records and the EMS personnel. The history is limited by the condition of the patient. No language interpreter was used.  Alcohol Intoxication  Associated symptoms include shortness of breath. Pertinent negatives include no chest pain and no abdominal pain.  Mental Health Problem  Presenting symptoms: depression and suicidal thoughts   Presenting symptoms: no hallucinations and no homicidal ideas   Patient accompanied by:  Law enforcement Onset quality:  Gradual Duration:  3 days Timing:  Constant Progression:  Unchanged Chronicity:  Recurrent Context: alcohol use   Treatment compliance:  Some of the time Relieved by:  None tried Worsened by:  Alcohol Ineffective treatments:  None tried Associated symptoms: no abdominal pain and no chest pain   Risk factors: hx of mental illness     Past Medical History:  Diagnosis Date  . Anxiety disorder   . Arthritis    BILATERAL SHOULDERS, ELBOWS AND HANDS AND LEFT HIP AND KNEES--HX CORTISONE SHOTS IN SHOULDERS, ELBOWS, HIP AND KNEES  . Basal cell carcinoma of back 04/30/2016   Dermatologist- Dr Nevada Crane;   MOHS sx- Dr Levada Dy   . Bladder outlet obstruction   . Chronic idiopathic constipation 05/08/2015  . Complication of anesthesia    DIFFICULT WAKING   .  COPD (chronic obstructive pulmonary disease) Gold C Frequent exacerbations 01/13/2014   [name] 07/08/14: FeV1 51% FeV1/FVC 66% FVC 59% 10/5/2015ONO RA was normal  10/13/2014  ONO on RA NORMAL   . COPD, frequent exacerbations ([place])    pulmologist-  dr Joya Gaskins--  [place] Rm Stage C.04-25-15 recent COPD exacerbation-much improved now, after tx. in ER [facility].  Marland Kitchen Depression   . Diabetes mellitus without complication ([place])    BODERLINE - DIET CONTROL  . Dysrhythmia      PVC'S  . Emphysema lung ([place])    stage 2  . Family history of adverse reaction to anesthesia    father would wake up with agitation   . Former smoker 01/13/2014  . Gastroesophageal reflux disease without esophagitis   . Heavy alcohol use 04/30/2016  . History of chronic bronchitis   . History of oxygen administration    oxygen use 2 l/m nasally at bedtime and exertional occasions  . History of rheumatic fever   . History of TB (tuberculosis)    1984--  hospitalized for 4 month treatment  . History of urinary retention   . Hx of multiple concussions    x 2 per patient   . Hypertension   . Melanoma ([place])   . Nocturnal oxygen desaturation    USES O2 NIGHTLY  . OSA (obstructive sleep apnea) 01/13/2014  . PONV (postoperative nausea and vomiting)   . Post-polio syndrome    polio at age 60--PT WAS IN IRON LUNG; PT WAS IN W/C UNTIL AGE 25; STILL HAS WEAKNESS RIGHT SIDE  . Prostate cancer ([place])   . Schizophrenia ([place])   . Tuberculosis    Hosp 4 months rx , left early   . Urticaria     Patient Active Problem List   Diagnosis Date Noted  . Severe persistent asthma without complication 40/98/1191  . Bee sting-induced anaphylaxis 02/12/2017  . Anaphylactic reaction due to food, initial encounter 02/12/2017  . Current use of beta blocker 02/12/2017  . Allergic rhinitis 01/15/2017  . COPD exacerbation ([place]) 11/04/2016  . Asthma exacerbation 11/03/2016  . Nausea without vomiting 09/17/2016  . Shortness of breath   . Diabetes mellitus type 2 in obese ([place]) 06/26/2016  . non-specific Chest pain 06/12/2016  . Patient's noncompliance with other medical treatment and regimen 06/02/2016  . Hyperlipidemia, mixed 06/02/2016  . Elevated liver enzymes  06/02/2016  . Schizophrenia ([place]) 04/30/2016  . Environmental and seasonal allergies 04/30/2016  . Diabetes mellitus without complication- (diet controlled; w/o proteinuria) 04/30/2016  . Heavy alcohol use- Many yrs  04/30/2016  . Basal cell  carcinoma of back 04/30/2016  . Obesity 04/30/2016  . Hx of multiple concussions 04/30/2016  . h/o Vitamin D deficiency 04/30/2016  . Screen for sexually transmitted diseases 04/30/2016  . Immunocompromised state ([place]) 04/30/2016  . Cervical radiculopathy 01/15/2016  . Alcohol withdrawal ([place]) 11/22/2015  . h/o Suicidal thoughts 11/21/2015  . Dry mouth 10/07/2015  . Anxiety 09/19/2015  . OAB (overactive bladder) 07/27/2015  . Chronic idiopathic constipation 05/08/2015  . Screening for colon cancer   . Benign neoplasm of cecum   . Benign neoplasm of ascending colon   . Benign neoplasm of transverse colon   . Benign neoplasm of sigmoid colon   . Rectal polyp   . Gastroesophageal reflux disease without esophagitis   . Esophageal stricture   . Joint stiffness of multiple sites 10/17/2014  . Benign localized hyperplasia of prostate with urinary obstruction 09/29/2014  . concerns for memory loss 09/13/2014  .  Tuberculosis   . Diverticulum of bladder 06/17/2014  . Neuropathy ([place]) 04/11/2014  . Post-polio syndrome 01/13/2014  . Other emphysema ([place]) 01/13/2014  . Hypertension 01/13/2014  . Former smoker 01/13/2014  . h/o Prostate cancer 01/13/2014  . History of tuberculosis 01/13/2014  . Bipolar disorder ([place]) 01/13/2014  . Explosive personality disorder ([place]) 01/13/2014  . Erectile dysfunction 01/13/2014  . Nephrolithiasis 01/13/2014  . Chronic pain 01/13/2014  . Alcoholism ([place]) 01/13/2014  . OSA (obstructive sleep apnea) 01/13/2014    Past Surgical History:  Procedure Laterality Date  . CARDIOVASCULAR STRESS TEST  06-08-2014  dr Mare Ferrari   normal lexiscan study/  no ischemia/  not gated due to PAC's  . COLONOSCOPY N/A 05/03/2015   Procedure: COLONOSCOPY;  Surgeon: [name], MD;  Location: [facility];  Service: Endoscopy;  Laterality: N/A;  . CYSTOSCOPY N/A 10/25/2015   Procedure: CYSTOSCOPY;  Surgeon: [name], MD;  Location: [facility]S;  Service: Urology;  Laterality:  N/A;  . CYSTOSCOPY W/ CYSTOGRAM/  TRANSRECTAL ULTRASOUND PROSTATE BX  03-22-2009  . ESOPHAGOGASTRODUODENOSCOPY N/A 03/22/2015   Procedure: ESOPHAGOGASTRODUODENOSCOPY (EGD) with dilation;  Surgeon: [name], MD;  Location: [facility];  Service: Endoscopy;  Laterality: N/A;  . excision of skin lesion    . LAPAROSCOPIC CHOLECYSTECTOMY  2013  . left elbow surgery      due to fracture   . NASAL SEPTUM SURGERY  2000  . OTHER SURGICAL HISTORY      Muscle & bone Graft/Polio  . polio surgeries      14 polio surgeries   . PROSTATE BIOPSY N/A 09/28/2014   Procedure: PROSTATE ULTRASOUND/BIOPSY;  Surgeon: [name], MD;  Location: [facility];  Service: Urology;  Laterality: N/A;  . PROSTATE BIOPSY N/A 10/25/2015   Procedure: PROSTATE BIOPSY AND ULTRASOUND;  Surgeon: [name], MD;  Location: [facility];  Service: Urology;  Laterality: N/A;  . SAVORY DILATION N/A 03/22/2015   Procedure: SAVORY DILATION;  Surgeon: [name], MD;  Location: [facility];  Service: Endoscopy;  Laterality: N/A;  . SHOULDER ARTHROSCOPY WITH OPEN ROTATOR CUFF REPAIR Bilateral 2013  &  1999   removal spurs and bursectomy  . TRANSURETHRAL INCISION OF BLADDER NECK N/A 10/25/2015   Procedure:  TRANSURETHRAL INCISION OF BLADDER NECK;  Surgeon: [name], MD;  Location: [facility];  Service: Urology;  Laterality: N/A;  . TRANSURETHRAL RESECTION OF PROSTATE N/A 09/28/2014   Procedure: TRANSURETHRAL RESECTION OF THE PROSTATE (TURP);  Surgeon: [name], MD;  Location: [facility];  Service: Urology;  Laterality: N/A;  . URETEROSOPY STONE EXTRACTION  2000       Home Medications    Prior to Admission medications   Medication Sig Start Date End Date Taking? Authorizing Provider  acetaminophen (TYLENOL) 500 MG tablet Take 500 mg by mouth every 6 (six) hours as needed for headache (pain).    [provider]  albuterol (PROVENTIL) (2.5 MG/3ML) 0.083% nebulizer solution Take 2.5 mg by nebulization every 6 (six) hours as needed  for wheezing or shortness of breath.    [provider]  albuterol (VENTOLIN HFA) 108 (90 Base) MCG/ACT inhaler Inhale 2 puffs into the lungs every 4 (four) hours as needed for wheezing or shortness of breath.    [provider]  ALPRAZolam [name]) 1 MG tablet Take 0.5 tablets (0.5 mg total) by mouth 2 (two) times daily as needed for anxiety. Patient taking differently: Take 0.5 mg by mouth 4 (four) times daily.  10/29/16  Shelda Pal, DO  arformoterol (BROVANA) 15 MCG/2ML NEBU Take 2 mLs (15 mcg total) by nebulization 2 (two) times daily. 10/24/16   Rigoberto Noel, MD  baclofen (LIORESAL) 10 MG tablet Take 10 mg by mouth at bedtime.    [provider]  bisoprolol (ZEBETA) 5 MG tablet TAKE 1 TABLET BY MOUTH DAILY 03/25/17   Nani Ravens, Crosby Oyster, DO  budesonide (PULMICORT) 0.25 MG/2ML nebulizer solution Take 2 mLs (0.25 mg total) by nebulization 2 (two) times daily. 09/18/16   Parrett, Fonnie Mu, NP  dexlansoprazole (DEXILANT) 60 MG capsule Take 1 capsule (60 mg total) by mouth daily. Patient taking differently: Take 60 mg by mouth every other day.  09/17/16   [place], Betty G, MD  EPINEPHrine 0.3 mg/0.3 mL IJ SOAJ injection Inject 0.3 mLs (0.3 mg total) into the muscle once. Patient taking differently: Inject 0.3 mg into the muscle once as needed (severe allergic reactions).  02/24/16   Malvin Johns, MD  HYDROcodone-acetaminophen (NORCO/VICODIN) 5-325 MG tablet Take 1 tablet by mouth every 6 (six) hours as needed for moderate pain.    [provider]  loratadine (CLARITIN) 10 MG tablet Take 10 mg by mouth daily.    [provider]  nitroGLYCERIN (NITROSTAT) 0.4 MG SL tablet Place 1 tablet (0.4 mg total) under the tongue every 5 (five) minutes as needed for chest pain. 06/21/15   Brunetta Jeans, PA-C  ondansetron (ZOFRAN ODT) 8 MG disintegrating tablet Take 1 tablet (8 mg total) by mouth every 8 (eight) hours as needed for nausea or vomiting.  03/28/17   Molpus, John, MD  OXYGEN Inhale 2 L into the lungs at bedtime. Reported on 11/25/2015    [provider]  triamcinolone cream (KENALOG) 0.1 % Apply twice a day if needed to red itchy areas below the face to insect bites. 02/12/17   Charlies Silvers, MD    Family History Family History  Problem Relation Age of Onset  . Alzheimer's disease Father 4       Deceased  . Stomach cancer Father   . Heart attack Father   . Heart disease Father   . Skin cancer Mother        Facial-Living  . Cancer Mother        skin  . Alcohol abuse Sister        x2  . Mental illness Sister        x2  . Alcohol abuse Sister   . Diabetes Maternal Aunt        x2  . Thyroid disease Maternal Aunt        x4  . Diabetes Maternal Uncle   . Tuberculosis Paternal Grandfather   . Tuberculosis Paternal Grandmother   . Alzheimer's disease Paternal Aunt   . Alzheimer's disease Paternal Uncle   . Colon cancer Neg Hx   . Colon polyps Neg Hx   . Crohn's disease Neg Hx   . Ulcerative colitis Neg Hx   . Allergic rhinitis Neg Hx   . Angioedema Neg Hx   . Asthma Neg Hx   . Eczema Neg Hx   . Urticaria Neg Hx     Social History Social History   Tobacco Use  . Smoking status: Former Smoker    Years: 47.00    Types: Pipe    Last attempt to quit: 08/25/2013    Years since quitting: 4.1  . Smokeless tobacco: Former Systems developer  . Tobacco comment: 5-6 times smoking a pipe  Substance Use Topics  . Alcohol use: Yes    Alcohol/week: 8.4 oz    Types: 14 Cans of beer per week    Comment: 2 cans daily  . Drug use: No     Allergies   Aspirin; Hornet venom; Ivp dye [iodinated diagnostic agents]; Levaquin [levofloxacin in d5w]; Nsaids; Penicillins; Tolmetin; Perforomist [formoterol]; Buprenorphine hcl; Morphine and related; and Oxycodone   Review of Systems Review of Systems  Unable to perform ROS: Other  Constitutional: Negative for diaphoresis and fever.  Respiratory: Positive for shortness of  breath.   Cardiovascular: Negative for chest pain.  Gastrointestinal: Negative for abdominal pain, constipation, diarrhea, nausea and vomiting.  Genitourinary: Negative for dysuria and hematuria.  Musculoskeletal: Positive for arthralgias (L shoulder/arm, but none ongoing).  Allergic/Immunologic: Positive for immunocompromised state (DM2).  Neurological: Negative for syncope, weakness, light-headedness and numbness.  Psychiatric/Behavioral: Positive for suicidal ideas. Negative for hallucinations and homicidal ideas.   LEVEL 5 CAVEAT DUE TO INTOXICATION  Physical Exam Updated Vital Signs BP (!) 143/76 (BP Location: Left Arm)   Pulse 66   Temp 98.3 F (36.8 C) (Oral)   Resp (!) 22   SpO2 97%   Physical Exam  Constitutional: Vital signs are normal. He appears well-developed and well-nourished.  Non-toxic appearance. No distress.  Afebrile, nontoxic, NAD, appears intoxicated, smells of alcohol  HENT:  Head: Normocephalic and atraumatic. Head is without raccoon's eyes, without Battle's sign and without abrasion.  Nose: Sinus tenderness and nasal deformity present. No nasal septal hematoma. No epistaxis.  Mouth/Throat: Oropharynx is clear and moist and mucous membranes are normal.  No racoon eyes or battle's sign, no abrasions, no scalp crepitus or tenderness. Mild tenderness over nasal bridge with slight deformity, no crepitus. Dried blood in both nares, no ongoing epistaxis and no septal hematoma.   Eyes: Conjunctivae and EOM are normal. Pupils are equal, round, and reactive to light. Right eye exhibits no discharge. Left eye exhibits no discharge.  PERRL, EOMI although he will not cooperate enough with exam in order to evaluate for any nystagmus. No periorbital swelling or bruising, conjunctiva clear.   Neck: Normal range of motion. Neck supple. No spinous process tenderness and no muscular tenderness present. No neck rigidity. Normal range of motion present.  FROM intact without spinous  process TTP, no bony stepoffs or deformities, no paraspinous muscle TTP or muscle spasms. No rigidity or meningeal signs. No bruising or swelling.   Cardiovascular: Normal rate, regular rhythm, normal heart sounds and intact distal pulses. Exam reveals no gallop and no friction rub.  No murmur heard. RRR, nl s1/s2, no m/r/g, distal pulses intact  Pulmonary/Chest: Effort normal and breath sounds normal. No respiratory distress. He has no decreased breath sounds. He has no wheezes. He has no rhonchi. He has no rales.  CTAB in all lung fields, no w/r/r, no hypoxia or increased WOB, speaking in full sentences, SpO2 97% on RA   Abdominal: Soft. Normal appearance and bowel sounds are normal. He exhibits no distension. There is no tenderness. There is no rigidity, no rebound, no guarding, no CVA tenderness, no tenderness at McBurney's point and negative Murphy's sign.  Musculoskeletal: Normal range of motion.  No focal tenderness to entire L arm/shoulder/clavicle area.  MAE x4 Strength and sensation grossly intact in all extremities Distal pulses intact Gait mostly steady but he does appear intoxicated  Neurological: He is alert. He has normal strength. No sensory deficit.  Pt uncooperative with full neuro exam, refuses to answer orientation questions,  appears intoxicated.   Skin: Skin is warm, dry and intact. No rash noted.  Psychiatric: His speech is tangential. He is not actively hallucinating. He exhibits a depressed mood. He expresses suicidal ideation. He expresses no homicidal ideation. He expresses no suicidal plans and no homicidal plans.  Appears intoxicated, somewhat tangential speech. Depressed affect. Endorsing SI without a plan, denies HI or AVH, doesn't seem to be responding to internal stimuli.   Nursing note and vitals reviewed.    ED Treatments / Results  Labs (all labs ordered are listed, but only abnormal results are displayed) Labs Reviewed  COMPREHENSIVE METABOLIC PANEL -  Abnormal; Notable for the following components:      Result Value   Sodium 131 (*)    Glucose, Bld 146 (*)    Creatinine, Ser 1.27 (*)    GFR calc non Af Amer 57 (*)    All other components within normal limits  ETHANOL - Abnormal; Notable for the following components:   Alcohol, Ethyl (B) 194 (*)    All other components within normal limits  ACETAMINOPHEN LEVEL - Abnormal; Notable for the following components:   Acetaminophen (Tylenol), Serum <10 (*)    All other components within normal limits  CBC  RAPID URINE DRUG SCREEN, HOSP PERFORMED  SALICYLATE LEVEL  I-STAT TROPONIN, ED    EKG  EKG Interpretation None       Radiology Dg Chest 2 View  Result Date: 10/16/2017 CLINICAL DATA:  Shortness of Breath EXAM: CHEST  2 VIEW COMPARISON:  April 05, 2017 FINDINGS: There is no appreciable edema or consolidation. The heart size and pulmonary vascularity are normal. No adenopathy. There is mild degenerative change in the lower thoracic region. IMPRESSION: No edema or consolidation. Electronically Signed   By: Lowella Grip III M.D.   On: 10/16/2017 21:00   Ct Head Wo Contrast  Result Date: 10/16/2017 CLINICAL DATA:  Fall, head trauma, patient states he had been drinking and took Xanax, was making physical threats and being aggressive EXAM: CT HEAD WITHOUT CONTRAST CT MAXILLOFACIAL WITHOUT CONTRAST TECHNIQUE: Multidetector CT imaging of the head and maxillofacial structures were performed using the standard protocol without intravenous contrast. Multiplanar CT image reconstructions of the maxillofacial structures were also generated. Right side of face marked with BB. COMPARISON:  CT head 06/02/2016 FINDINGS: CT HEAD FINDINGS Brain: Normal ventricular morphology. No midline shift or mass effect. Normal appearance of brain parenchyma. No intracranial hemorrhage, mass lesion or evidence of acute infarction. No extra-axial fluid collections. Vascular: Unremarkable Skull: Intact Other: N/A CT  MAXILLOFACIAL FINDINGS Osseous: Biconvex nasal septal deviation. TMJ alignment normal. Osseous mineralization normal. No facial bone fractures identified. Orbits: Bony orbits intact.  Intraorbital soft tissue planes clear. Sinuses: Paranasal sinuses, mastoid air cells, and middle ear cavities clear bilaterally Soft tissues: No significant abnormalities. IMPRESSION: Normal CT head. No acute facial bone abnormalities. Electronically Signed   By: Lavonia Dana M.D.   On: 10/16/2017 21:08   Ct Maxillofacial Wo Contrast  Result Date: 10/16/2017 CLINICAL DATA:  Fall, head trauma, patient states he had been drinking and took Xanax, was making physical threats and being aggressive EXAM: CT HEAD WITHOUT CONTRAST CT MAXILLOFACIAL WITHOUT CONTRAST TECHNIQUE: Multidetector CT imaging of the head and maxillofacial structures were performed using the standard protocol without intravenous contrast. Multiplanar CT image reconstructions of the maxillofacial structures were also generated. Right side of face marked with BB. COMPARISON:  CT head 06/02/2016 FINDINGS: CT HEAD FINDINGS Brain: Normal ventricular morphology. No midline shift or  mass effect. Normal appearance of brain parenchyma. No intracranial hemorrhage, mass lesion or evidence of acute infarction. No extra-axial fluid collections. Vascular: Unremarkable Skull: Intact Other: N/A CT MAXILLOFACIAL FINDINGS Osseous: Biconvex nasal septal deviation. TMJ alignment normal. Osseous mineralization normal. No facial bone fractures identified. Orbits: Bony orbits intact.  Intraorbital soft tissue planes clear. Sinuses: Paranasal sinuses, mastoid air cells, and middle ear cavities clear bilaterally Soft tissues: No significant abnormalities. IMPRESSION: Normal CT head. No acute facial bone abnormalities. Electronically Signed   By: Lavonia Dana M.D.   On: 10/16/2017 21:08    Procedures Procedures (including critical care time)   Myoview Stress Test 08/02/16: Study Highlights    Nuclear stress EF: 61%. Normal LV function .  There was no ST segment deviation noted during stress.  The study is normal. No ischemia . no evidence of infarction  This is a low risk study.     Medications Ordered in ED Medications  acetaminophen (TYLENOL) tablet 500 mg (not administered)  albuterol (PROVENTIL HFA;VENTOLIN HFA) 108 (90 Base) MCG/ACT inhaler 2 puff (not administered)  ALPRAZolam (XANAX) tablet 0.5 mg (not administered)  arformoterol (BROVANA) nebulizer solution 15 mcg (not administered)  baclofen (LIORESAL) tablet 10 mg (not administered)  bisoprolol (ZEBETA) tablet 5 mg (not administered)  budesonide (PULMICORT) nebulizer solution 0.25 mg (not administered)  pantoprazole (PROTONIX) EC tablet 40 mg (not administered)  nitroGLYCERIN (NITROSTAT) SL tablet 0.4 mg (not administered)  ondansetron (ZOFRAN-ODT) disintegrating tablet 8 mg (not administered)  predniSONE (DELTASONE) tablet 2.5 mg (not administered)  Vitamin D (Ergocalciferol) (DRISDOL) capsule 50,000 Units (not administered)  LORazepam (ATIVAN) injection 0-4 mg (not administered)    Or  LORazepam (ATIVAN) tablet 0-4 mg (not administered)  LORazepam (ATIVAN) injection 0-4 mg (not administered)    Or  LORazepam (ATIVAN) tablet 0-4 mg (not administered)  thiamine (VITAMIN B-1) tablet 100 mg (not administered)    Or  thiamine (B-1) injection 100 mg (not administered)  zolpidem (AMBIEN) tablet 5 mg (not administered)  alum & mag hydroxide-simeth (MAALOX/MYLANTA) 200-200-20 MG/5ML suspension 30 mL (not administered)     Initial Impression / Assessment and Plan / ED Course  I have reviewed the triage vital signs and the nursing notes.  Pertinent labs & imaging results that were available during my care of the patient were reviewed by me and considered in my medical decision making (see chart for details).     67 y.o. male here for EtOH intoxication and fall. Pt very poor historian and clearly  intoxicated, therefore history is very limited. States he had pain in his left shoulder and it "took him down" causing him to fall; now states the pain resolved entirely, and he denies that he had CP, but he states the pain took his breathe away. He hit his nose and had a nose bleed, which has also resolved. He's also reporting SI without a plan. He denies ongoing pain at this time. On exam, PERRL, EOMI although he won't cooperate fully in order to evaluate for any nystagmus, dried blood in b/l nares, no septal hematoma, mild tenderness to nasal bridge which has slight deformity but no crepitus; gait somewhat steady although he does appear intoxicated; will not cooperate with neuro exam; smells of alcohol. No abdominal tenderness, no shoulder or arm tenderness. Will get labs, EKG, CXR, CT head/face, and reassess shortly.   7:00 PM Pt apparently refusing care by nursing staff; will IVC pt since he appears to be intoxicated, he's endorsing SI, and does not currently have capacity  to refuse care in his intoxicated state. Discussed case with my attending Dr. Tamera Punt who agrees with plan. IVC paperwork given to secretary to send. Of note, EKG unremarkable, but remainder of work up pending. Will reassess shortly.    8:26 PM UDS unremarkable. We have not yet received the paperwork back from the magistrate, and pt is refusing to allow [name] to get blood or perform imaging until we have a court order to do so. Discussed with pt that it's important that we get this process started ASAP, and he refuses anything until we have the court order. Will continue to await magistrate sending back the IVC paperwork and reassess shortly.   9:48 PM Pt feeling well, denies any ongoing complaints at this time. Work up reveals: CBC WNL. CMP with stable kidney function, mildly elevated glucose 146, mildly low Na 131, but otherwise fairly unremarkable. Trop neg. CXR neg. CT head/face neg. Salicylate and acetaminophen level unremarkable.  EtOH level 194 which confirms my suspicions that he is intoxicated. Pt medically cleared at this time. Doubt need for further emergent work up at this time. Psych hold orders and home med orders placed. Please see TTS notes for further documentation of care/dispo. Pt stable at time of med clearance.     Final Clinical Impressions(s) / ED Diagnoses   Final diagnoses:  Alcoholic intoxication without complication ([place])  Suicidal ideations  Type 2 diabetes mellitus with hyperglycemia, without long-term current use of insulin ([place])  Chronic kidney disease, unspecified CKD stage  Chronic left shoulder pain  Contusion of nose, initial encounter    ED Discharge Orders    [address] [place], [place] 10/16/17 2148    Malvin Johns, MD 10/16/17 2352    Malvin Johns, MD 10/16/17 2359

## 2017-10-16 NOTE — ED Triage Notes (Signed)
Per EMS-states was called out due to patient calling after a fall-left upper arm/shoulder pain and nose pain-states he has been drinking and took a couple of xanax-refused transport once they arrived-states he threatened to shoot self with shot gun-was making physical threats to EMS-aggressive at times

## 2017-10-16 NOTE — ED Notes (Addendum)
Pt stated "I have prostate cancer.  I was dx'd in 2012.  I'm not doing chemo or anything.  I was a polio victim."

## 2017-10-16 NOTE — ED Notes (Signed)
Pt now stating "I'm on oxygen @ 2 L/[place] at night."

## 2017-10-16 NOTE — BH Assessment (Addendum)
Assessment Note  [name] is an 67 y.o. male.  The pt came in after he became intoxicated and fell.  He then told EMS that he wanted to kill himself by shooting himself with a gun.  The pt stated before he started drinking after he opened the light bill and saw a bill for over 500 dollars.  When he saw the bill began to feel hopeless and started drinking heavily.  He reported he had a 12 pack of beer and 2 40 oz beers today.  The pt refused labs when he first arrived, but when he did agree to do the blood draw the pt had a blood alcohol level of 194.  The pt and his wife reported he typically drinks about 3 beers a day.    He now denies SI and any recent history of suicidal thoughts.  He reports that he was "just talking" when he said he would shoot himself.  The last time the pt was suicidal was when he was 17 according to the pt.  He stated he had thoughts to hang himself, but didn't go through with it.  The pt denies current SI, HI, and psychosis  Diagnosis: F 33.1 Major Depressive Disorder, Moderate  Past Medical History:  Past Medical History:  Diagnosis Date  . Anxiety disorder   . Arthritis    BILATERAL SHOULDERS, ELBOWS AND HANDS AND LEFT HIP AND KNEES--HX CORTISONE SHOTS IN SHOULDERS, ELBOWS, HIP AND KNEES  . Basal cell carcinoma of back 04/30/2016   Dermatologist- Dr Nevada Crane;   MOHS sx- Dr Levada Dy   . Bladder outlet obstruction   . Chronic idiopathic constipation 05/08/2015  . Complication of anesthesia    DIFFICULT WAKING   . COPD (chronic obstructive pulmonary disease) Gold C Frequent exacerbations 01/13/2014   [name] 07/08/14: FeV1 51% FeV1/FVC 66% FVC 59% 10/5/2015ONO RA was normal  10/13/2014  ONO on RA NORMAL   . COPD, frequent exacerbations ([place])    pulmologist-  dr Joya Gaskins--  [place] Rm Stage C.04-25-15 recent COPD exacerbation-much improved now, after tx. in ER [facility].  Marland Kitchen Depression   . Diabetes mellitus without complication ([place])    BODERLINE - DIET CONTROL  . Dysrhythmia     PVC'S  . Emphysema lung ([place])    stage 2  . Family history of adverse reaction to anesthesia    father would wake up with agitation   . Former smoker 01/13/2014  . Gastroesophageal reflux disease without esophagitis   . Heavy alcohol use 04/30/2016  . History of chronic bronchitis   . History of oxygen administration    oxygen use 2 l/m nasally at bedtime and exertional occasions  . History of rheumatic fever   . History of TB (tuberculosis)    1984--  hospitalized for 4 month treatment  . History of urinary retention   . Hx of multiple concussions    x 2 per patient   . Hypertension   . Melanoma ([place])   . Nocturnal oxygen desaturation    USES O2 NIGHTLY  . OSA (obstructive sleep apnea) 01/13/2014  . PONV (postoperative nausea and vomiting)   . Post-polio syndrome    polio at age 72--PT WAS IN IRON LUNG; PT WAS IN W/C UNTIL AGE 40; STILL HAS WEAKNESS RIGHT SIDE  . Prostate cancer ([place])   . Schizophrenia ([place])   . Tuberculosis    Hosp 4 months rx , left early   . Urticaria     Past Surgical History:  Procedure  Laterality Date  . CARDIOVASCULAR STRESS TEST  06-08-2014  dr Mare Ferrari   normal lexiscan study/  no ischemia/  not gated due to PAC's  . COLONOSCOPY N/A 05/03/2015   Procedure: COLONOSCOPY;  Surgeon: [name], MD;  Location: [facility];  Service: Endoscopy;  Laterality: N/A;  . CYSTOSCOPY N/A 10/25/2015   Procedure: CYSTOSCOPY;  Surgeon: [name], MD;  Location: [facility];  Service: Urology;  Laterality: N/A;  . CYSTOSCOPY W/ CYSTOGRAM/  TRANSRECTAL ULTRASOUND PROSTATE BX  03-22-2009  . ESOPHAGOGASTRODUODENOSCOPY N/A 03/22/2015   Procedure: ESOPHAGOGASTRODUODENOSCOPY (EGD) with dilation;  Surgeon: [name], MD;  [facility];  Service: Endoscopy;  Laterality: N/A;  . excision of skin lesion    . LAPAROSCOPIC CHOLECYSTECTOMY  2013  . left elbow surgery      due to fracture   . NASAL SEPTUM SURGERY  2000  . OTHER SURGICAL HISTORY      Muscle & bone  Graft/Polio  . polio surgeries      14 polio surgeries   . PROSTATE BIOPSY N/A 09/28/2014   Procedure: PROSTATE ULTRASOUND/BIOPSY;  Surgeon: [name], MD;  [facility];  Service: Urology;  Laterality: N/A;  . PROSTATE BIOPSY N/A 10/25/2015   Procedure: PROSTATE BIOPSY AND ULTRASOUND;  Surgeon: [name], MD;  [facility];  Service: Urology;  Laterality: N/A;  . SAVORY DILATION N/A 03/22/2015   Procedure: SAVORY DILATION;  Surgeon: [name], MD;  [facility];  Service: Endoscopy;  Laterality: N/A;  . SHOULDER ARTHROSCOPY WITH OPEN ROTATOR CUFF REPAIR Bilateral 2013  &  1999   removal spurs and bursectomy  . TRANSURETHRAL INCISION OF BLADDER NECK N/A 10/25/2015   Procedure:  TRANSURETHRAL INCISION OF BLADDER NECK;  Surgeon: [name], MD;  [facility];  Service: Urology;  Laterality: N/A;  . TRANSURETHRAL RESECTION OF PROSTATE N/A 09/28/2014   Procedure: TRANSURETHRAL RESECTION OF THE PROSTATE (TURP);  Surgeon: [name], MD;  [facility];  Service: Urology;  Laterality: N/A;  . URETEROSOPY STONE EXTRACTION  2000    Family History:  Family History  Problem Relation Age of Onset  . Alzheimer's disease Father 4       Deceased  . Stomach cancer Father   . Heart attack Father   . Heart disease Father   . Skin cancer Mother        Facial-Living  . Cancer Mother        skin  . Alcohol abuse Sister        x2  . Mental illness Sister        x2  . Alcohol abuse Sister   . Diabetes Maternal Aunt        x2  . Thyroid disease Maternal Aunt        x4  . Diabetes Maternal Uncle   . Tuberculosis Paternal Grandfather   . Tuberculosis Paternal Grandmother   . Alzheimer's disease Paternal Aunt   . Alzheimer's disease Paternal Uncle   . Colon cancer Neg Hx   . Colon polyps Neg Hx   . Crohn's disease Neg Hx   . Ulcerative colitis Neg Hx   . Allergic rhinitis Neg Hx   . Angioedema Neg Hx   . Asthma Neg Hx   . Eczema Neg Hx   . Urticaria Neg Hx      Social History:  reports that he quit smoking about 4 years ago. His smoking use included pipe. He quit after 47.00 years  of use. He has quit using smokeless tobacco. He reports that he drinks about 8.4 oz of alcohol per week. He reports that he does not use drugs.  Additional Social History:  Alcohol / Drug Use Pain Medications: See MAR Prescriptions: See MAR Over the Counter: See MAR History of alcohol / drug use?: Yes Longest period of sobriety (when/how long): unknown Negative Consequences of Use: Personal relationships Substance #1 Name of Substance 1: alcohol 1 - Amount (size/oz): 3 beers a day 1 - Frequency: daily 1 - Last Use / Amount: 10/16/2017  CIWA: CIWA-Ar BP: (!) 143/76 Pulse Rate: 66 COWS:    Allergies:  Allergies  Allergen Reactions  . Aspirin Anaphylaxis  . [place]et Venom Anaphylaxis  . Ivp Dye [Iodinated Diagnostic Agents] Anaphylaxis  . Levaquin [Levofloxacin In D5w] Shortness Of Breath and Swelling    In addition: sweating, chest pain, and diarrhea.   . Nsaids Anaphylaxis  . Penicillins Anaphylaxis    Heart stops Has patient had a PCN reaction causing immediate rash, facial/tongue/throat swelling, SOB or lightheadedness with hypotension: yes Has patient had a PCN reaction causing severe rash involving mucus membranes or skin necrosis: NO Has patient had a PCN reaction that required hospitalization yes Has patient had a PCN reaction occurring within the last 10 years: No If all of the above answers are "NO", then may proceed with Cephalosporin use.   . Tolmetin Anaphylaxis  . Perforomist [Formoterol]     Increased wheezing, shortness of breath  . Buprenorphine Hcl Nausea And Vomiting    Can take with zofran   . Morphine And Related Nausea And Vomiting    Can take with zofran   . Oxycodone Itching and Rash    Home Medications:  (Not in a hospital admission)  OB/GYN Status:  No LMP for male patient.  General Assessment Data [facility] of  [facility]: [facility] TTS Assessment: In system Is this a Tele or Face-to-Face Assessment?: Face-to-Face Is this an Initial Assessment or a Re-assessment for this encounter?: Initial Assessment Marital status: Married [place] name: Bolla Living Arrangements: Spouse/significant other Can pt return to current living arrangement?: Yes Admission Status: Voluntary Is patient capable of signing voluntary admission?: Yes Referral Source: Self/Family/Friend Insurance type: Medicare     Crisis Care Plan Living Arrangements: Spouse/significant other Legal Guardian: Other:(Self) Name of Psychiatrist: none Name of Therapist: none  Education Status Is patient currently in school?: No Current Grade: none Highest grade of school patient has completed: associates degree Name of school: NA Contact person: NA  Risk to self with the past 6 months Suicidal Ideation: No-Not Currently/Within Last 6 Months Has patient been a risk to self within the past 6 months prior to admission? : Yes Suicidal Intent: No-Not Currently/Within Last 6 Months Has patient had any suicidal intent within the past 6 months prior to admission? : Yes Is patient at risk for suicide?: Yes Suicidal Plan?: No Has patient had any suicidal plan within the past 6 months prior to admission? : No Access to Means: Yes Specify Access to Suicidal Means: has guns at home What has been your use of drugs/alcohol within the last 12 months?: daily alcohol use Previous Attempts/Gestures: Yes How many times?: 0 Other Self Harm Risks: none Triggers for Past Attempts: Unknown Intentional Self Injurious Behavior: None Family Suicide History: No Recent stressful life event(s): Financial Problems(500 dollars light bill) Persecutory voices/beliefs?: No Depression: Yes Depression Symptoms: Isolating Substance abuse history and/or treatment for substance abuse?: Yes Suicide prevention information given to non-admitted patients:  Not  applicable  Risk to Others within the past 6 months Homicidal Ideation: No Does patient have any lifetime risk of violence toward others beyond the six months prior to admission? : No Thoughts of Harm to Others: No Current Homicidal Intent: No Current Homicidal Plan: No Access to Homicidal Means: No Identified Victim: NA History of harm to others?: No Assessment of Violence: On admission Violent Behavior Description: was verbally agressive when he arrived Does patient have access to weapons?: Yes (Comment) Criminal Charges Pending?: No Does patient have a court date: No Is patient on probation?: No  Psychosis Hallucinations: None noted Delusions: None noted  Mental Status Report Appearance/Hygiene: In scrubs, Unremarkable Eye Contact: Good Motor Activity: Freedom of movement, Unremarkable Speech: Logical/coherent Level of Consciousness: Alert Mood: Pleasant Affect: Appropriate to circumstance Anxiety Level: Minimal Thought Processes: Coherent, Relevant Judgement: Partial Orientation: Person, Place, Time, Situation Obsessive Compulsive Thoughts/Behaviors: None  Cognitive Functioning Concentration: Normal Memory: Recent Intact, Remote Intact IQ: Average Insight: Fair Impulse Control: Fair Appetite: Good Weight Loss: 0 Weight Gain: 0 Sleep: No Change Total Hours of Sleep: 8 Vegetative Symptoms: None  ADLScreening [facility] Assessment Services) Patient's cognitive ability adequate to safely complete daily activities?: Yes Patient able to express need for assistance with ADLs?: Yes Independently performs ADLs?: Yes (appropriate for developmental age)  Prior Inpatient Therapy Prior Inpatient Therapy: No Prior Therapy Dates: NA Prior Therapy Facilty/Provider(s): NA Reason for Treatment: NA  Prior Outpatient Therapy Prior Outpatient Therapy: Yes Prior Therapy Dates: 1997 Prior Therapy Facilty/Provider(s): unknown Reason for Treatment: alcohol use, depression Does  patient have an ACCT team?: No Does patient have Intensive In-House Services?  : No Does patient have [facility] services? : No Does patient have P4CC services?: No  ADL Screening (condition at time of admission) Patient's cognitive ability adequate to safely complete daily activities?: Yes Patient able to express need for assistance with ADLs?: Yes Independently performs ADLs?: Yes (appropriate for developmental age)       Abuse/Neglect Assessment (Assessment to be complete while patient is alone) Abuse/Neglect Assessment Can Be Completed: Yes Physical Abuse: Denies Verbal Abuse: Denies Sexual Abuse: Denies Exploitation of patient/patient's resources: Denies Self-Neglect: Denies Values / Beliefs Cultural Requests During Hospitalization: None Spiritual Requests During Hospitalization: None Consults Spiritual Care Consult Needed: No Social Work Consult Needed: No Regulatory affairs officer (For Healthcare) Does Patient Have a Medical Advance Directive?: No Would patient like information on creating a medical advance directive?: No - Patient declined    Additional Information 1:1 In Past 12 Months?: No CIRT Risk: No Elopement Risk: No Does patient have medical clearance?: Yes     Disposition:  Disposition Initial Assessment Completed for this Encounter: Yes Disposition of Patient: Re-evaluation by Psychiatry recommended   Per Patriciaann Clan, PA it is recommended the pt be observed overnight and be reassessed in the AM.  The pt's doctor Zammit  And RN Margaretha Sheffield were notified of the recommendation.  On Site Evaluation by:   Reviewed with Physician:    Enzo Montgomery 10/16/2017 11:26 PM

## 2017-10-17 DIAGNOSIS — S0033XA Contusion of nose, initial encounter: Secondary | ICD-10-CM | POA: Diagnosis not present

## 2017-10-17 MED ORDER — HYDROXYZINE HCL 25 MG PO TABS
25.0000 mg | ORAL_TABLET | Freq: Once | ORAL | Status: AC
  Administered 2017-10-17: 25 mg via ORAL
  Filled 2017-10-17: qty 1

## 2017-10-17 MED ORDER — GABAPENTIN 300 MG PO CAPS
300.0000 mg | ORAL_CAPSULE | Freq: Four times a day (QID) | ORAL | Status: DC
  Administered 2017-10-17: 300 mg via ORAL
  Filled 2017-10-17: qty 1

## 2017-10-17 MED ORDER — GABAPENTIN 300 MG PO CAPS: 300.0000 mg | ORAL_CAPSULE | Freq: Four times a day (QID) | ORAL | 0 refills | Status: DC

## 2017-10-17 NOTE — ED Provider Notes (Signed)
Patient reassessed by myself this morning.  He remains well-appearing.  He is in good spirits.  No tremors or signs of acute alcohol withdrawal.  He admits to drinking too much yesterday.  Will be reassessed by psychiatry this morning.   Duffy Bruce, MD 10/17/17 [phone]

## 2017-10-17 NOTE — Discharge Instructions (Signed)
For your behavioral health needs, you are advised to follow up with [facility].  Contact them at your earliest opportunity to ask about scheduling an intake appointment: ° °     [facility] °     [address] °     [place], [place] [address]  °     [phone] °

## 2017-10-17 NOTE — BH Assessment (Addendum)
[facility] Assessment Progress Note  Per Buford Dresser, DO, this pt does not require psychiatric hospitalization at this time.  Pt presents under IVC initiated by EDP Malvin Johns, MD, which Dr Mariea Clonts has rescinded.  Pt is to be discharged from [facility] with recommendation to follow up with [facility] in [facility]  This has been included in pt's discharge instructions.  Pt would also benefit from seeing Peer Support Specialists; they will be asked to speak to pt.  Pt's nurse has been notified.  Jalene Mullet, [place] Triage Specialist [phone]

## 2017-10-17 NOTE — BHH Suicide Risk Assessment (Signed)
Suicide Risk Assessment  Discharge Assessment   [facility] Discharge Suicide Risk Assessment   Principal Problem: Alcohol abuse with alcohol-induced mood disorder [facility]) Discharge Diagnoses:  Patient Active Problem List   Diagnosis Date Noted  . Alcohol abuse with alcohol-induced mood disorder ([place]) [F10.14] 11/22/2015    Priority: High  . Severe persistent asthma without complication [K93.81] 82/99/3716  . Bee sting-induced anaphylaxis [T63.441A] 02/12/2017  . Anaphylactic reaction due to food, initial encounter [T78.00XA] 02/12/2017  . Current use of beta blocker [Z79.899] 02/12/2017  . Allergic rhinitis [J30.9] 01/15/2017  . COPD exacerbation ([place]) [J44.1] 11/04/2016  . Asthma exacerbation [J45.901] 11/03/2016  . Nausea without vomiting [R11.0] 09/17/2016  . Shortness of breath [R06.02]   . Diabetes mellitus type 2 in obese ([place]) [E11.69, E66.9] 06/26/2016  . non-specific Chest pain [R07.9] 06/12/2016  . Patient's noncompliance with other medical treatment and regimen [Z91.19] 06/02/2016  . Hyperlipidemia, mixed [E78.2] 06/02/2016  . Elevated liver enzymes  [R74.8] 06/02/2016  . Schizophrenia ([place]) [F20.9] 04/30/2016  . Environmental and seasonal allergies [J30.89] 04/30/2016  . Diabetes mellitus without complication- (diet controlled; w/o proteinuria) [E11.9] 04/30/2016  . Heavy alcohol use- Many yrs  [Z78.9] 04/30/2016  . Basal cell carcinoma of back [C44.519] 04/30/2016  . Obesity [E66.9] 04/30/2016  . Hx of multiple concussions [Z87.820] 04/30/2016  . h/o Vitamin D deficiency [E55.9] 04/30/2016  . Screen for sexually transmitted diseases [Z11.3] 04/30/2016  . Immunocompromised state ([place]) [D84.9] 04/30/2016  . Cervical radiculopathy [M54.12] 01/15/2016  . Alcohol withdrawal ([place]) [F10.239] 11/22/2015  . h/o Suicidal thoughts [R45.851] 11/21/2015  . Dry mouth [R68.2] 10/07/2015  . Anxiety [F41.9] 09/19/2015  . OAB (overactive bladder) [N32.81] 07/27/2015  . Chronic idiopathic  constipation [K59.04] 05/08/2015  . Screening for colon cancer [Z12.11]   . Benign neoplasm of cecum [D12.0]   . Benign neoplasm of ascending colon [D12.2]   . Benign neoplasm of transverse colon [D12.3]   . Benign neoplasm of sigmoid colon [D12.5]   . Rectal polyp [K62.1]   . Gastroesophageal reflux disease without esophagitis [K21.9]   . Esophageal stricture [K22.2]   . Joint stiffness of multiple sites [M25.60] 10/17/2014  . Benign localized hyperplasia of prostate with urinary obstruction [N40.1, N13.8] 09/29/2014  . concerns for memory loss [R41.3] 09/13/2014  . Tuberculosis [A15.9]   . Diverticulum of bladder [N32.3] 06/17/2014  . Neuropathy ([place]) [G62.9] 04/11/2014  . Post-polio syndrome [G14] 01/13/2014  . Other emphysema ([place]) [J43.8] 01/13/2014  . Hypertension [I10] 01/13/2014  . Former smoker [R67.893] 01/13/2014  . h/o Prostate cancer [C61] 01/13/2014  . History of tuberculosis [Z86.11] 01/13/2014  . Bipolar disorder ([place]) [F31.9] 01/13/2014  . Explosive personality disorder ([place]) [F60.3] 01/13/2014  . Erectile dysfunction [N52.9] 01/13/2014  . Nephrolithiasis [N20.0] 01/13/2014  . Chronic pain [G89.29] 01/13/2014  . Alcoholism ([place]) [F10.20] 01/13/2014  . OSA (obstructive sleep apnea) [G47.33] 01/13/2014    Total Time spent with patient: 45 minutes  Musculoskeletal: Strength & Muscle Tone: within normal limits Gait & Station: normal Patient leans: N/A  Psychiatric Specialty Exam:   Blood pressure 136/72, pulse 74, temperature 98.4 F (36.9 C), temperature source Oral, resp. rate 16, SpO2 97 %.There is no height or weight on file to calculate BMI.  General Appearance: Casual  Eye Contact::  Good  Speech:  Normal Rate409  Volume:  Normal  Mood:  Euthymic  Affect:  Congruent  Thought Process:  Coherent and Descriptions of Associations: Intact  Orientation:  Full (Time, Place, and Person)  Thought Content:  WDL  and Logical  Suicidal Thoughts:  No  Homicidal  Thoughts:  No  Memory:  Immediate;   Good Recent;   Good Remote;   Good  Judgement:  Fair  Insight:  Fair  Psychomotor Activity:  Normal  Concentration:  Good  Recall:  Good  Fund of Knowledge:Good  Language: Good  Akathisia:  No  Handed:  Right  AIMS (if indicated):     Assets:  Housing Intimacy Leisure Time Physical Health Resilience Social Support  Sleep:     Cognition: WNL  ADL's:  Intact   Mental Status Per Nursing Assessment::   On Admission:   alcohol intoxication with suicidal ideations.  Today, he is clear and coherent with no suicidal/homicidal ideations, hallucinations, or withdrawal symptoms.  His wife is at his bedside and has no safety concerns.  He is agreeable to talk to Peer Support and return to [facility] which has worked for him in the past.  Stable for discharge.  Demographic Factors:  Male and Caucasian  Loss Factors: NA  Historical Factors: NA  Risk Reduction Factors:   Sense of responsibility to family, Living with another person, especially a relative and Positive social support  Continued Clinical Symptoms:  None  Cognitive Features That Contribute To Risk:  None    Suicide Risk:  Minimal: No identifiable suicidal ideation.  Patients presenting with no risk factors but with morbid ruminations; may be classified as minimal risk based on the severity of the depressive symptoms    Plan Of Care/Follow-up recommendations:  Activity:  as tolerated Diet:  heart healthy diet  LORD, JAMISON, NP 10/17/2017, 11:25 AM

## 2017-10-17 NOTE — ED Notes (Signed)
Pt stated "I take xanax to go to sleep.  Can I get that?"

## 2017-10-17 NOTE — BH Assessment (Signed)
The pt was given resources, so he can find an outpatient psychiatrist and counselor.  The pt stated he wanted to go to a [facility].

## 2017-10-17 NOTE — Patient Outreach (Signed)
ED Peer Support Specialist Patient Intake (Complete at intake & 30-60 Day Follow-up)  Name: [name]  MRN: [id number]  Age: 67 y.o.   Date of Admission: 10/17/2017  Intake: Initial Comments:      Primary Reason Admitted:  Pt presents under IVC initiated by EDP Malvin Johns, MD, which Dr Mariea Clonts has rescinded.  Pt is to be discharged from [facility] with recommendation to follow up with [facility] in [facility].  This has been included in pt's discharge instructions.  Pt would also benefit from seeing Peer Support Specialists; they will be asked to speak to pt.  Pt's nurse has been notified.     Lab values: Alcohol/ETOH: Positive Positive UDS? No Amphetamines: No Barbiturates: No Benzodiazepines: No Cocaine: No Opiates: No Cannabinoids: No  Demographic information: Gender: Male Ethnicity: White Marital Status: Married Insurance underwriter Status: Medicare(Med sup) Ecologist (Work Neurosurgeon, Physicist, medical, Research scientist (life sciences) ) Lives with: Partner/Spouse Living situation: House/Apartment  Reported Patient History: Patient reported health conditions: (Prostate cancer High Blood pressure) Patient aware of HIV and hepatitis status: No  In past year, has patient visited ED for any reason? No  Number of ED visits:    Reason(s) for visit:    In past year, has patient been hospitalized for any reason? No  Number of hospitalizations:    Reason(s) for hospitalization:    In past year, has patient been arrested? No  Number of arrests:    Reason(s) for arrest:    In past year, has patient been incarcerated? No  Number of incarcerations:    Reason(s) for incarceration:    In past year, has patient received medication-assisted treatment? No  In past year, patient received the following treatments:    In past year, has patient received any harm reduction services? No  Did this include any of the following?    In past year, has patient received  care from a mental health provider for diagnosis other than SUD? No  In past year, is this first time patient has overdosed? No  Number of past overdoses:    In past year, is this first time patient has been hospitalized for an overdose? No  Number of hospitalizations for overdose(s):    Is patient currently receiving treatment for a mental health diagnosis? No  Patient reports experiencing difficulty participating in SUD treatment: No    Most important reason(s) for this difficulty?    Has patient received prior services for treatment? No  In past, patient has received services from following agencies:    Plan of Care:  Suggested follow up at these agencies/treatment centers: ( Attend AA group )  Other information: CPSS Aaron Edelman and Jenny Reichmann met with Pt to see if he wanted Detox at the time. CPSS addressed the fact that Pt discussed several facilities an services that he may benefit from. CPSS made Pt aware that there are several AA meetings in the area that he can attend to receive some help that he may need without going into a facility. CPSS left contact information for Pt to stay in contact and for outside support.     Aaron Edelman Londell Noll, CPSS  10/17/2017 11:19 AM

## 2017-10-17 NOTE — Progress Notes (Signed)
Admission:  alcohol intoxication with suicidal ideations.  Today, he is clear and coherent with no suicidal/homicidal ideations, hallucinations, or withdrawal symptoms.  His wife is at his bedside and has no safety concerns.  He is agreeable to talk to Peer Support and return to [facility] which has worked for him in the past.  Stable for discharge.  Waylan Boga, PMHNP

## 2018-01-08 ENCOUNTER — Encounter (HOSPITAL_BASED_OUTPATIENT_CLINIC_OR_DEPARTMENT_OTHER): Payer: Self-pay | Admitting: Emergency Medicine

## 2018-01-08 ENCOUNTER — Emergency Department (HOSPITAL_BASED_OUTPATIENT_CLINIC_OR_DEPARTMENT_OTHER): Payer: Medicare Other

## 2018-01-08 ENCOUNTER — Other Ambulatory Visit: Payer: Self-pay

## 2018-01-08 ENCOUNTER — Emergency Department (HOSPITAL_BASED_OUTPATIENT_CLINIC_OR_DEPARTMENT_OTHER)
Admission: EM | Admit: 2018-01-08 | Discharge: 2018-01-08 | Disposition: A | Discharge status: Home / Self Care | Payer: Medicare Other | Attending: Emergency Medicine | Admitting: Emergency Medicine

## 2018-01-08 DIAGNOSIS — R0602 Shortness of breath: Secondary | ICD-10-CM | POA: Diagnosis not present

## 2018-01-08 DIAGNOSIS — R5383 Other fatigue: Secondary | ICD-10-CM | POA: Diagnosis not present

## 2018-01-08 DIAGNOSIS — Z79899 Other long term (current) drug therapy: Secondary | ICD-10-CM | POA: Diagnosis not present

## 2018-01-08 DIAGNOSIS — Z8546 Personal history of malignant neoplasm of prostate: Secondary | ICD-10-CM | POA: Diagnosis not present

## 2018-01-08 DIAGNOSIS — Z87891 Personal history of nicotine dependence: Secondary | ICD-10-CM | POA: Diagnosis not present

## 2018-01-08 DIAGNOSIS — J449 Chronic obstructive pulmonary disease, unspecified: Secondary | ICD-10-CM | POA: Insufficient documentation

## 2018-01-08 DIAGNOSIS — J441 Chronic obstructive pulmonary disease with (acute) exacerbation: Secondary | ICD-10-CM | POA: Diagnosis not present

## 2018-01-08 DIAGNOSIS — I10 Essential (primary) hypertension: Secondary | ICD-10-CM | POA: Diagnosis not present

## 2018-01-08 DIAGNOSIS — J069 Acute upper respiratory infection, unspecified: Secondary | ICD-10-CM | POA: Diagnosis not present

## 2018-01-08 DIAGNOSIS — E119 Type 2 diabetes mellitus without complications: Secondary | ICD-10-CM | POA: Diagnosis not present

## 2018-01-08 DIAGNOSIS — R0789 Other chest pain: Secondary | ICD-10-CM | POA: Diagnosis not present

## 2018-01-08 LAB — CBC WITH DIFFERENTIAL/PLATELET
BASOS ABS: 0 10*3/uL (ref 0.0–0.1)
BASOS PCT: 0 %
EOS ABS: 0 10*3/uL (ref 0.0–0.7)
Eosinophils Relative: 0 %
HCT: 43.2 % (ref 39.0–52.0)
HEMOGLOBIN: 14.5 g/dL (ref 13.0–17.0)
Lymphocytes Relative: 12 %
Lymphs Abs: 1 10*3/uL (ref 0.7–4.0)
MCH: 32.2 pg (ref 26.0–34.0)
MCHC: 33.6 g/dL (ref 30.0–36.0)
MCV: 96 fL (ref 78.0–100.0)
MONOS PCT: 8 %
Monocytes Absolute: 0.7 10*3/uL (ref 0.1–1.0)
NEUTROS PCT: 80 %
Neutro Abs: 6.8 10*3/uL (ref 1.7–7.7)
Platelets: 212 10*3/uL (ref 150–400)
RBC: 4.5 MIL/uL (ref 4.22–5.81)
RDW: 12.7 % (ref 11.5–15.5)
WBC: 8.5 10*3/uL (ref 4.0–10.5)

## 2018-01-08 LAB — COMPREHENSIVE METABOLIC PANEL
ALBUMIN: 4.1 g/dL (ref 3.5–5.0)
ALK PHOS: 46 U/L (ref 38–126)
ALT: 31 U/L (ref 17–63)
ANION GAP: 10 (ref 5–15)
AST: 26 U/L (ref 15–41)
BUN: 10 mg/dL (ref 6–20)
CALCIUM: 9 mg/dL (ref 8.9–10.3)
CO2: 25 mmol/L (ref 22–32)
Chloride: 100 mmol/L — ABNORMAL LOW (ref 101–111)
Creatinine, Ser: 1.16 mg/dL (ref 0.61–1.24)
GFR calc Af Amer: >60 mL/min (ref >60)
GFR calc non Af Amer: >60 mL/min (ref >60)
GLUCOSE: 131 mg/dL — AB (ref 65–99)
Potassium: 4 mmol/L (ref 3.5–5.1)
SODIUM: 135 mmol/L (ref 135–145)
Total Bilirubin: 0.7 mg/dL (ref 0.3–1.2)
Total Protein: 7.2 g/dL (ref 6.5–8.1)

## 2018-01-08 LAB — TROPONIN I: Troponin I: <0.03 ng/mL (ref <0.03)

## 2018-01-08 MED ORDER — IPRATROPIUM-ALBUTEROL 0.5-2.5 (3) MG/3ML IN SOLN
3.0000 mL | RESPIRATORY_TRACT | Status: AC
  Administered 2018-01-08 (×3): 3 mL via RESPIRATORY_TRACT
  Filled 2018-01-08: qty 3
  Filled 2018-01-08: qty 6

## 2018-01-08 MED ORDER — PREDNISONE 10 MG PO TABS: 40.0000 mg | ORAL_TABLET | Freq: Every day | ORAL | 0 refills | Status: AC

## 2018-01-08 MED ORDER — PREDNISONE 50 MG PO TABS
60.0000 mg | ORAL_TABLET | Freq: Once | ORAL | Status: AC
  Administered 2018-01-08: 60 mg via ORAL
  Filled 2018-01-08: qty 1

## 2018-01-08 MED ORDER — AZITHROMYCIN 250 MG PO TABS: 250.0000 mg | ORAL_TABLET | Freq: Every day | ORAL | 0 refills | Status: DC

## 2018-01-08 NOTE — ED Triage Notes (Signed)
Patient reports shortness of breath which began last night.  States that he has a history of emphyzema and is having to use his oxygen all day which is new for him.  Reports wheezing-using nebulizer treatments at home without relief.

## 2018-01-08 NOTE — ED Provider Notes (Signed)
[facility] EMERGENCY DEPARTMENT Provider Note   CSN: [id number] Arrival date & time: 01/08/18  1651     History   Chief Complaint Chief Complaint  Patient presents with  . Shortness of Breath    HPI [name] is a 67 y.o. male.  HPI   Hx of COPD, has a severe cold, wants to avoid emphysema attack. Called PCP and sent in.  Today carried load of wood inside, was wheezing, used brovana and albuterol used neb at lunch. Doesn't feel like it helped.  Normally uses O2 at night, but today used concentrator at home.  2-3 days of having a cold, began to have cough and runny nose, spitting up phlegm. Yellow-brown color. Has sick contacts. Reports whenever he gets a cold his wheezing gets worse.  No known fevers.  Chest pain when can't breath, feels like left side, had very mild this AM, didn't take nitro today. Not seen Cardiologist in one year-did see [facility].    2017 had stress test which was normal. No known fevers, no body aches.     Past Medical History:  Diagnosis Date  . Anxiety disorder   . Arthritis    BILATERAL SHOULDERS, ELBOWS AND HANDS AND LEFT HIP AND KNEES--HX CORTISONE SHOTS IN SHOULDERS, ELBOWS, HIP AND KNEES  . Basal cell carcinoma of back 04/30/2016   Dermatologist- Dr Nevada Crane;   MOHS sx- Dr Levada Dy   . Bladder outlet obstruction   . Chronic idiopathic constipation 05/08/2015  . Complication of anesthesia    DIFFICULT WAKING   . COPD (chronic obstructive pulmonary disease) Gold C Frequent exacerbations 01/13/2014   [name] 07/08/14: FeV1 51% FeV1/FVC 66% FVC 59% 10/5/2015ONO RA was normal  10/13/2014  ONO on RA NORMAL   . COPD, frequent exacerbations ([place])    pulmologist-  dr Joya Gaskins--  [place] Rm Stage C.04-25-15 recent COPD exacerbation-much improved now, after tx. in ER [facility].  Marland Kitchen Depression   . Diabetes mellitus without complication ([place])    BODERLINE - DIET CONTROL  . Dysrhythmia    PVC'S  . Emphysema lung ([place])    stage 2  . Family history of  adverse reaction to anesthesia    father would wake up with agitation   . Former smoker 01/13/2014  . Gastroesophageal reflux disease without esophagitis   . Heavy alcohol use 04/30/2016  . History of chronic bronchitis   . History of oxygen administration    oxygen use 2 l/m nasally at bedtime and exertional occasions  . History of rheumatic fever   . History of TB (tuberculosis)    1984--  hospitalized for 4 month treatment  . History of urinary retention   . Hx of multiple concussions    x 2 per patient   . Hypertension   . Melanoma ([place])   . Nocturnal oxygen desaturation    USES O2 NIGHTLY  . OSA (obstructive sleep apnea) 01/13/2014  . PONV (postoperative nausea and vomiting)   . Post-polio syndrome    polio at age 27--PT WAS IN IRON LUNG; PT WAS IN W/C UNTIL AGE 63; STILL HAS WEAKNESS RIGHT SIDE  . Prostate cancer ([place])   . Schizophrenia ([place])   . Tuberculosis    Hosp 4 months rx , left early   . Urticaria     Patient Active Problem List   Diagnosis Date Noted  . Severe persistent asthma without complication 12/87/8676  . Bee sting-induced anaphylaxis 02/12/2017  . Anaphylactic reaction due to food, initial encounter 02/12/2017  .  Current use of beta blocker 02/12/2017  . Allergic rhinitis 01/15/2017  . COPD exacerbation ([place]) 11/04/2016  . Asthma exacerbation 11/03/2016  . Nausea without vomiting 09/17/2016  . Shortness of breath   . Diabetes mellitus type 2 in obese ([place]) 06/26/2016  . non-specific Chest pain 06/12/2016  . Patient's noncompliance with other medical treatment and regimen 06/02/2016  . Hyperlipidemia, mixed 06/02/2016  . Elevated liver enzymes  06/02/2016  . Schizophrenia ([place]) 04/30/2016  . Environmental and seasonal allergies 04/30/2016  . Diabetes mellitus without complication- (diet controlled; w/o proteinuria) 04/30/2016  . Heavy alcohol use- Many yrs  04/30/2016  . Basal cell carcinoma of back 04/30/2016  . Obesity 04/30/2016  . Hx of multiple  concussions 04/30/2016  . h/o Vitamin D deficiency 04/30/2016  . Screen for sexually transmitted diseases 04/30/2016  . Immunocompromised state ([place]) 04/30/2016  . Cervical radiculopathy 01/15/2016  . Alcohol withdrawal ([place]) 11/22/2015  . Alcohol abuse with alcohol-induced mood disorder ([place]) 11/22/2015  . h/o Suicidal thoughts 11/21/2015  . Dry mouth 10/07/2015  . Anxiety 09/19/2015  . OAB (overactive bladder) 07/27/2015  . Chronic idiopathic constipation 05/08/2015  . Screening for colon cancer   . Benign neoplasm of cecum   . Benign neoplasm of ascending colon   . Benign neoplasm of transverse colon   . Benign neoplasm of sigmoid colon   . Rectal polyp   . Gastroesophageal reflux disease without esophagitis   . Esophageal stricture   . Joint stiffness of multiple sites 10/17/2014  . Benign localized hyperplasia of prostate with urinary obstruction 09/29/2014  . concerns for memory loss 09/13/2014  . Tuberculosis   . Diverticulum of bladder 06/17/2014  . Neuropathy ([place]) 04/11/2014  . Post-polio syndrome 01/13/2014  . Other emphysema ([place]) 01/13/2014  . Hypertension 01/13/2014  . Former smoker 01/13/2014  . h/o Prostate cancer 01/13/2014  . History of tuberculosis 01/13/2014  . Bipolar disorder ([place]) 01/13/2014  . Explosive personality disorder ([place]) 01/13/2014  . Erectile dysfunction 01/13/2014  . Nephrolithiasis 01/13/2014  . Chronic pain 01/13/2014  . Alcoholism ([place]) 01/13/2014  . OSA (obstructive sleep apnea) 01/13/2014    Past Surgical History:  Procedure Laterality Date  . CARDIOVASCULAR STRESS TEST  06-08-2014  dr Mare Ferrari   normal lexiscan study/  no ischemia/  not gated due to PAC's  . COLONOSCOPY N/A 05/03/2015   Procedure: COLONOSCOPY;  Surgeon: [name], MD;  Location: [facility];  Service: Endoscopy;  Laterality: N/A;  . CYSTOSCOPY N/A 10/25/2015   Procedure: CYSTOSCOPY;  Surgeon: [name], MD;  Location: [facility];  Service: Urology;  Laterality:  N/A;  . CYSTOSCOPY W/ CYSTOGRAM/  TRANSRECTAL ULTRASOUND PROSTATE BX  03-22-2009  . ESOPHAGOGASTRODUODENOSCOPY N/A 03/22/2015   Procedure: ESOPHAGOGASTRODUODENOSCOPY (EGD) with dilation;  Surgeon: [name], MD;  Location: [facility];  Service: Endoscopy;  Laterality: N/A;  . excision of skin lesion    . LAPAROSCOPIC CHOLECYSTECTOMY  2013  . left elbow surgery      due to fracture   . NASAL SEPTUM SURGERY  2000  . OTHER SURGICAL HISTORY      Muscle & bone Graft/Polio  . polio surgeries      14 polio surgeries   . PROSTATE BIOPSY N/A 09/28/2014   Procedure: PROSTATE ULTRASOUND/BIOPSY;  Surgeon: [name], MD;  Location: [facility];  Service: Urology;  Laterality: N/A;  . PROSTATE BIOPSY N/A 10/25/2015   Procedure: PROSTATE BIOPSY AND ULTRASOUND;  Surgeon: [name], MD;  Location: [facility];  Service: Urology;  Laterality: N/A;  . SAVORY DILATION N/A 03/22/2015   Procedure: SAVORY DILATION;  Surgeon: [name], MD;  Location: [facility];  Service: Endoscopy;  Laterality: N/A;  . SHOULDER ARTHROSCOPY WITH OPEN ROTATOR CUFF REPAIR Bilateral 2013  &  1999   removal spurs and bursectomy  . TRANSURETHRAL INCISION OF BLADDER NECK N/A 10/25/2015   Procedure:  TRANSURETHRAL INCISION OF BLADDER NECK;  Surgeon: [name], MD;  Location: [facility];  Service: Urology;  Laterality: N/A;  . TRANSURETHRAL RESECTION OF PROSTATE N/A 09/28/2014   Procedure: TRANSURETHRAL RESECTION OF THE PROSTATE (TURP);  Surgeon: [name], MD;  Location: [facility];  Service: Urology;  Laterality: N/A;  . URETEROSOPY STONE EXTRACTION  2000        Home Medications    Prior to Admission medications   Medication Sig Start Date End Date Taking? Authorizing Provider  acetaminophen (TYLENOL) 500 MG tablet Take 500 mg by mouth every 6 (six) hours as needed for headache (pain).    [provider]  albuterol (PROVENTIL) (2.5 MG/3ML) 0.083% nebulizer solution Take 2.5 mg by nebulization every 6 (six) hours as needed  for wheezing or shortness of breath.    [provider]  albuterol (VENTOLIN HFA) 108 (90 Base) MCG/ACT inhaler Inhale 2 puffs into the lungs every 4 (four) hours as needed for wheezing or shortness of breath.    [provider]  arformoterol (BROVANA) 15 MCG/2ML NEBU Take 2 mLs (15 mcg total) by nebulization 2 (two) times daily. 10/24/16   Rigoberto Noel, MD  azithromycin (ZITHROMAX) 250 MG tablet Take 1 tablet (250 mg total) by mouth daily. Take first 2 tablets together, then 1 every day until finished. 01/08/18   Gareth Morgan, MD  baclofen (LIORESAL) 10 MG tablet Take 10 mg by mouth at bedtime.    [provider]  bisoprolol (ZEBETA) 5 MG tablet TAKE 1 TABLET BY MOUTH DAILY 03/25/17   Nani Ravens, Crosby Oyster, DO  budesonide (PULMICORT) 0.25 MG/2ML nebulizer solution Take 2 mLs (0.25 mg total) by nebulization 2 (two) times daily. 09/18/16   Parrett, Fonnie Mu, NP  dexlansoprazole (DEXILANT) 60 MG capsule Take 1 capsule (60 mg total) by mouth daily. Patient taking differently: Take 60 mg by mouth every other day.  09/17/16   [place], Betty G, MD  EPINEPHrine 0.3 mg/0.3 mL IJ SOAJ injection Inject 0.3 mLs (0.3 mg total) into the muscle once. Patient taking differently: Inject 0.3 mg into the muscle once as needed (severe allergic reactions).  02/24/16   Malvin Johns, MD  gabapentin (NEURONTIN) 300 MG capsule Take 1 capsule (300 mg total) by mouth 4 (four) times daily. 10/17/17   Patrecia Pour, NP  Menthol, Topical Analgesic, (BIOFREEZE EX) Apply 1 application topically. TO THE LEFT KNEE AS NEEDED FOR PAIN    [provider]  nitroGLYCERIN (NITROSTAT) 0.4 MG SL tablet Place 1 tablet (0.4 mg total) under the tongue every 5 (five) minutes as needed for chest pain. 06/21/15   Brunetta Jeans, PA-C  ondansetron (ZOFRAN ODT) 8 MG disintegrating tablet Take 1 tablet (8 mg total) by mouth every 8 (eight) hours as needed for nausea or vomiting. 03/28/17   Molpus, John, MD    OXYGEN Inhale 2 L into the lungs at bedtime. DURING THE DAY IF NEEDED Reported on 11/25/2015    [provider]  predniSONE (DELTASONE) 10 MG tablet Take 4 tablets (40 mg total) by mouth daily for 4 days. 01/08/18 01/12/18  Gareth Morgan, MD  predniSONE (DELTASONE) 5 MG tablet Take 2.5 mg by mouth daily with breakfast.     [provider]  Vitamin D, Ergocalciferol, (DRISDOL) 50000 units CAPS capsule Take 50,000 Units by mouth every 7 (seven) days.    [provider]    Family History Family History  Problem Relation Age of Onset  . Alzheimer's disease Father 4       Deceased  . Stomach cancer Father   . Heart attack Father   . Heart disease Father   . Skin cancer Mother        Facial-Living  . Cancer Mother        skin  . Alcohol abuse Sister        x2  . Mental illness Sister        x2  . Alcohol abuse Sister   . Diabetes Maternal Aunt        x2  . Thyroid disease Maternal Aunt        x4  . Diabetes Maternal Uncle   . Tuberculosis Paternal Grandfather   . Tuberculosis Paternal Grandmother   . Alzheimer's disease Paternal Aunt   . Alzheimer's disease Paternal Uncle   . Colon cancer Neg Hx   . Colon polyps Neg Hx   . Crohn's disease Neg Hx   . Ulcerative colitis Neg Hx   . Allergic rhinitis Neg Hx   . Angioedema Neg Hx   . Asthma Neg Hx   . Eczema Neg Hx   . Urticaria Neg Hx     Social History Social History   Tobacco Use  . Smoking status: Former Smoker    Years: 47.00    Types: Pipe    Last attempt to quit: 08/25/2013    Years since quitting: 4.3  . Smokeless tobacco: Former Systems developer  . Tobacco comment: 5-6 times smoking a pipe  Substance Use Topics  . Alcohol use: Yes    Alcohol/week: 8.4 oz    Types: 14 Cans of beer per week    Comment: 2 cans daily  . Drug use: No     Allergies   Aspirin; Hornet venom; Ivp dye [iodinated diagnostic agents]; Levaquin [levofloxacin in d5w]; Nsaids; Penicillins; Tolmetin; Perforomist  [formoterol]; Buprenorphine hcl; Morphine and related; and Oxycodone   Review of Systems Review of Systems  Constitutional: Positive for fatigue. Negative for fever.  HENT: Positive for congestion and rhinorrhea. Negative for sore throat.   Eyes: Negative for visual disturbance.  Respiratory: Positive for cough, chest tightness, shortness of breath and wheezing.   Cardiovascular: Positive for chest pain.  Gastrointestinal: Negative for abdominal pain, nausea and vomiting.  Genitourinary: Negative for difficulty urinating.  Musculoskeletal: Negative for back pain and neck stiffness.  Skin: Negative for rash.  Neurological: Negative for syncope and headaches.     Physical Exam Updated Vital Signs BP 121/62 (BP Location: Left Arm)   Pulse (!) 110   Temp 99.3 F (37.4 C) (Oral)   Resp 20   Ht 6' (1.829 m)   Wt 104.3 kg (230 lb)   SpO2 95%   BMI 31.19 kg/m   Physical Exam  Constitutional: He is oriented to person, place, and time. He appears well-developed and well-nourished. No distress.  HENT:  Head: Normocephalic and atraumatic.  Eyes: Conjunctivae and EOM are normal.  Neck: Normal range of motion.  Cardiovascular: Normal rate, regular rhythm, normal heart sounds and intact distal pulses. Exam reveals no gallop and no friction rub.  No murmur heard. Pulmonary/Chest: Effort  normal. No respiratory distress. He has decreased breath sounds. He has wheezes. He has no rales.  Abdominal: Soft. He exhibits no distension. There is no tenderness. There is no guarding.  Musculoskeletal: He exhibits no edema.  Neurological: He is alert and oriented to person, place, and time.  Skin: Skin is warm and dry. He is not diaphoretic.  Nursing note and vitals reviewed.    ED Treatments / Results  Labs (all labs ordered are listed, but only abnormal results are displayed) Labs Reviewed  COMPREHENSIVE METABOLIC PANEL - Abnormal; Notable for the following components:      Result Value    Chloride 100 (*)    Glucose, Bld 131 (*)    All other components within normal limits  CBC WITH DIFFERENTIAL/PLATELET  TROPONIN I    EKG EKG Interpretation  Date/Time:  Wednesday January 08 2018 17:04:41 EDT Ventricular Rate:  81 PR Interval:  158 QRS Duration: 88 QT Interval:  358 QTC Calculation: 415 R Axis:   70 Text Interpretation:  Normal sinus rhythm Possible Anterior infarct , age undetermined Abnormal ECG No significant change since last tracing Confirmed by Gareth Morgan [phone]) on 01/08/2018 6:48:32 PM   Radiology Dg Chest 2 View  Result Date: 01/08/2018 CLINICAL DATA:  Emphysema.  Shortness of breath today. EXAM: CHEST - 2 VIEW COMPARISON:  Two-view chest x-ray 10/16/2017 FINDINGS: Heart size is normal. There is no edema or effusion. Chronic interstitial changes are stable. IMPRESSION: No acute cardiopulmonary disease. Electronically Signed   By: San Morelle M.D.   On: 01/08/2018 18:28    Procedures Procedures (including critical care time)  Medications Ordered in ED Medications  ipratropium-albuterol (DUONEB) 0.5-2.5 (3) MG/3ML nebulizer solution 3 mL (3 mLs Nebulization Given 01/08/18 1842)  predniSONE (DELTASONE) tablet 60 mg (60 mg Oral Given 01/08/18 1755)     Initial Impression / Assessment and Plan / ED Course  I have reviewed the triage vital signs and the nursing notes.  Pertinent labs & imaging results that were available during my care of the patient were reviewed by me and considered in my medical decision making (see chart for details).     67yo male with history above including COPD presents with concern for cough, wheezing, dyspnea in setting of URI symptoms and sick contacts. EKG without acute changes. Troponin negative. Low suspicion for ACS on history. Low suspicion for PE in the setting of URI symptoms, no hypoxia, history and exam more consistent with COPD exacerbation secondary to URI.   Given prednisone, duonebs x3 with improvement  of symptoms. Feels back to baseline, able to ambulate without significant dyspnea.  With change in sputum color will treat COPD exacerbation with both prednisone and azithromycin.  Rec albuterol scheduled at home. Discussed return precautions. Patient discharged in stable condition with understanding of reasons to return.    Final Clinical Impressions(s) / ED Diagnoses   Final diagnoses:  COPD exacerbation ([place])  Upper respiratory tract infection, unspecified type    ED Discharge Orders        Ordered    azithromycin (ZITHROMAX) 250 MG tablet  Daily     01/08/18 1957    predniSONE (DELTASONE) 10 MG tablet  Daily     01/08/18 1957       Gareth Morgan, MD 01/09/18 1221

## 2018-01-08 NOTE — ED Notes (Signed)
Nurse first-NAD-steady gait- O2 sat 96% HR 78 RR 20

## 2018-01-10 DIAGNOSIS — M109 Gout, unspecified: Secondary | ICD-10-CM | POA: Diagnosis not present

## 2018-01-10 DIAGNOSIS — F329 Major depressive disorder, single episode, unspecified: Secondary | ICD-10-CM | POA: Diagnosis not present

## 2018-01-10 DIAGNOSIS — I10 Essential (primary) hypertension: Secondary | ICD-10-CM | POA: Diagnosis not present

## 2018-01-10 DIAGNOSIS — J302 Other seasonal allergic rhinitis: Secondary | ICD-10-CM | POA: Diagnosis not present

## 2018-01-31 ENCOUNTER — Other Ambulatory Visit: Payer: Self-pay | Admitting: Pulmonary Disease

## 2018-02-12 DIAGNOSIS — I10 Essential (primary) hypertension: Secondary | ICD-10-CM | POA: Diagnosis not present

## 2018-02-12 DIAGNOSIS — R042 Hemoptysis: Secondary | ICD-10-CM | POA: Diagnosis not present

## 2018-02-12 DIAGNOSIS — E559 Vitamin D deficiency, unspecified: Secondary | ICD-10-CM | POA: Diagnosis not present

## 2018-02-12 DIAGNOSIS — E119 Type 2 diabetes mellitus without complications: Secondary | ICD-10-CM | POA: Diagnosis not present

## 2018-02-17 ENCOUNTER — Telehealth: Payer: Self-pay | Admitting: Internal Medicine

## 2018-02-17 ENCOUNTER — Ambulatory Visit: Payer: Medicare Other | Admitting: Pediatrics

## 2018-02-17 ENCOUNTER — Encounter: Payer: Self-pay | Admitting: Physician Assistant

## 2018-02-17 DIAGNOSIS — D225 Melanocytic nevi of trunk: Secondary | ICD-10-CM | POA: Diagnosis not present

## 2018-02-17 DIAGNOSIS — L820 Inflamed seborrheic keratosis: Secondary | ICD-10-CM | POA: Diagnosis not present

## 2018-02-17 DIAGNOSIS — B078 Other viral warts: Secondary | ICD-10-CM | POA: Diagnosis not present

## 2018-02-17 NOTE — Telephone Encounter (Signed)
We received a referral from Scherrie Gerlach, PA [facility]) for pt to be seen for hemoptysis. When I called patient he stated to have seen a GI MD in [place] less than a year ago.I explained to pt that we need to get those records faxed over before we could schedule and appt. He got upset and told me that he will not do that and hung up on me. A letter was sent to referring provider advising that pt declined to schedule appt.

## 2018-02-24 ENCOUNTER — Ambulatory Visit: Payer: Self-pay | Admitting: Pediatrics

## 2018-03-05 ENCOUNTER — Encounter: Payer: Self-pay | Admitting: Internal Medicine

## 2018-03-06 DIAGNOSIS — Z8612 Personal history of poliomyelitis: Secondary | ICD-10-CM | POA: Diagnosis not present

## 2018-03-06 DIAGNOSIS — M25461 Effusion, right knee: Secondary | ICD-10-CM | POA: Diagnosis not present

## 2018-03-06 DIAGNOSIS — M1712 Unilateral primary osteoarthritis, left knee: Secondary | ICD-10-CM | POA: Diagnosis not present

## 2018-03-18 ENCOUNTER — Ambulatory Visit: Payer: Medicare Other | Admitting: Pediatrics

## 2018-03-18 DIAGNOSIS — J309 Allergic rhinitis, unspecified: Secondary | ICD-10-CM

## 2018-04-07 DIAGNOSIS — R918 Other nonspecific abnormal finding of lung field: Secondary | ICD-10-CM | POA: Diagnosis not present

## 2018-04-21 DIAGNOSIS — M79641 Pain in right hand: Secondary | ICD-10-CM | POA: Diagnosis not present

## 2018-04-21 DIAGNOSIS — M79642 Pain in left hand: Secondary | ICD-10-CM | POA: Diagnosis not present

## 2018-05-20 ENCOUNTER — Ambulatory Visit: Payer: Medicare Other | Admitting: Pediatrics

## 2018-05-21 ENCOUNTER — Ambulatory Visit: Payer: Medicare Other | Admitting: Allergy and Immunology

## 2018-05-30 ENCOUNTER — Encounter: Payer: Self-pay | Admitting: Allergy & Immunology

## 2018-05-30 ENCOUNTER — Ambulatory Visit (INDEPENDENT_AMBULATORY_CARE_PROVIDER_SITE_OTHER): Payer: Medicare HMO | Admitting: Allergy & Immunology

## 2018-05-30 VITALS — BP 136/80 | HR 60 | Temp 98.4°F | Resp 20

## 2018-05-30 DIAGNOSIS — J302 Other seasonal allergic rhinitis: Secondary | ICD-10-CM

## 2018-05-30 DIAGNOSIS — J4550 Severe persistent asthma, uncomplicated: Secondary | ICD-10-CM

## 2018-05-30 DIAGNOSIS — J3089 Other allergic rhinitis: Secondary | ICD-10-CM

## 2018-05-30 MED ORDER — ALBUTEROL SULFATE HFA 108 (90 BASE) MCG/ACT IN AERS: 2.0000 | INHALATION_SPRAY | RESPIRATORY_TRACT | 2 refills | Status: DC | PRN

## 2018-05-30 MED ORDER — BUDESONIDE 0.25 MG/2ML IN SUSP: 0.2500 mg | Freq: Two times a day (BID) | RESPIRATORY_TRACT | 5 refills | Status: DC

## 2018-05-30 MED ORDER — PREDNISONE 10 MG PO TABS: 10.0000 mg | ORAL_TABLET | Freq: Every day | ORAL | 4 refills | Status: DC

## 2018-05-30 MED ORDER — ARFORMOTEROL TARTRATE 15 MCG/2ML IN NEBU: 15.0000 ug | INHALATION_SOLUTION | Freq: Two times a day (BID) | RESPIRATORY_TRACT | 5 refills | Status: DC

## 2018-05-30 MED ORDER — LORATADINE 10 MG PO TABS: 10.0000 mg | ORAL_TABLET | ORAL | 5 refills | Status: DC | PRN

## 2018-05-30 MED ORDER — FLUTICASONE PROPIONATE 50 MCG/ACT NA SUSP: 1.0000 | Freq: Every day | NASAL | 5 refills | Status: DC | PRN

## 2018-05-30 NOTE — Progress Notes (Signed)
FOLLOW UP  Date of Service/Encounter:  05/30/18   Assessment:   Severe persistent asthma with COPD overlap  Chronic prednisone use (5+ years) - currently on 10mg  daily  Seasonal and perennial allergic rhinitis  History of non-compliance  Pulmonary nodules (stable per last chest CT June 2019)  Complex medical history, including bipolar depression    Asthma Reportables:  Severity: severe persistent  Risk: high Control: very poorly controlled   Plan/Recommendations:   1. Severe persistent asthma with COPD overlap - Lung testing looks terrible today, but it did improve slightly with the albuterol treatment.  - We will refill your prednisone, but I really would recommend adding on a biologic medication.  - We will add on Nucala to target the eosinophils in your lungs. - Monthly Nucala injections will also give [name] the opportunity to see him on a monthly basis to make sure htat we are staying ahead of any problems.  - Take a Zyrtec (cetirizine) 10mg  on the day of the injection and a few days after to make sure you tolerate it well.  - You will be contacted by Tammy at our office who manages all of our biologic medications.  - I did emphasize the need for regular follow up appointments.  - We also discussed the complications of chronic prednisone use. - I am optimistic that we can lower the prednisone once we have the Nucala on board.  - Daily controller medication(s): Brovana one treatment twice daily + Pulmicort 0.25mg  one treatment twice daily + prednisone 10mg  once daily - Prior to physical activity: ProAir 2 puffs 10-15 minutes before physical activity. - Rescue medications: albuterol nebulizer one vial every 4-6 hours as needed or DuoNeb nebulizer one vial every 4-6 hours as needed - Asthma control goals:  * Full participation in all desired activities (may need albuterol before activity) * Albuterol use two time or less a week on average (not counting use with  activity) * Cough interfering with sleep two time or less a month * Oral steroids no more than once a year * No hospitalizations  2. Perennial and seasonal allergic rhinitis - Continue with loratadine 10mg  daily as needed. - Continue with fluticasone 1-2 sprays per nostril daily as needed.  3. Return in about 4 weeks (around 06/27/2018). It is mportant to keep regular follow up appointments so that we can adequately control your symptoms.    Total of 90 minutes, greater than 50% of which was spent in discussion of treatment and management options.    Subjective:   [name] is a 67 y.o. male presenting today for follow up of  Chief Complaint  Patient presents with  . Asthma    [name] has a history of the following: Patient Active Problem List   Diagnosis Date Noted  . Severe persistent asthma without complication 15/72/6203  . Bee sting-induced anaphylaxis 02/12/2017  . Anaphylactic reaction due to food, initial encounter 02/12/2017  . Current use of beta blocker 02/12/2017  . Allergic rhinitis 01/15/2017  . COPD exacerbation ([place]) 11/04/2016  . Asthma exacerbation 11/03/2016  . Nausea without vomiting 09/17/2016  . Shortness of breath   . Diabetes mellitus type 2 in obese ([place]) 06/26/2016  . non-specific Chest pain 06/12/2016  . Patient's noncompliance with other medical treatment and regimen 06/02/2016  . Hyperlipidemia, mixed 06/02/2016  . Elevated liver enzymes  06/02/2016  . Schizophrenia ([place]) 04/30/2016  . Environmental and seasonal allergies 04/30/2016  . Diabetes mellitus without complication- (diet controlled;  w/o proteinuria) 04/30/2016  . Heavy alcohol use- Many yrs  04/30/2016  . Basal cell carcinoma of back 04/30/2016  . Obesity 04/30/2016  . Hx of multiple concussions 04/30/2016  . h/o Vitamin D deficiency 04/30/2016  . Screen for sexually transmitted diseases 04/30/2016  . Immunocompromised state ([place]) 04/30/2016  . Cervical  radiculopathy 01/15/2016  . Alcohol withdrawal ([place]) 11/22/2015  . Alcohol abuse with alcohol-induced mood disorder ([place]) 11/22/2015  . h/o Suicidal thoughts 11/21/2015  . Dry mouth 10/07/2015  . Anxiety 09/19/2015  . OAB (overactive bladder) 07/27/2015  . Chronic idiopathic constipation 05/08/2015  . Screening for colon cancer   . Benign neoplasm of cecum   . Benign neoplasm of ascending colon   . Benign neoplasm of transverse colon   . Benign neoplasm of sigmoid colon   . Rectal polyp   . Gastroesophageal reflux disease without esophagitis   . Esophageal stricture   . Joint stiffness of multiple sites 10/17/2014  . Benign localized hyperplasia of prostate with urinary obstruction 09/29/2014  . concerns for memory loss 09/13/2014  . Tuberculosis   . Diverticulum of bladder 06/17/2014  . Neuropathy ([place]) 04/11/2014  . Post-polio syndrome 01/13/2014  . Other emphysema ([place]) 01/13/2014  . Hypertension 01/13/2014  . Former smoker 01/13/2014  . h/o Prostate cancer 01/13/2014  . History of tuberculosis 01/13/2014  . Bipolar disorder ([place]) 01/13/2014  . Explosive personality disorder ([place]) 01/13/2014  . Erectile dysfunction 01/13/2014  . Nephrolithiasis 01/13/2014  . Chronic pain 01/13/2014  . Alcoholism ([place]) 01/13/2014  . OSA (obstructive sleep apnea) 01/13/2014    History obtained from: chart review and patient.  [name]'s Primary Care Provider is Levin Bacon, NP.     [name] is a 67 y.o. male presenting for a follow up visit.  He was last seen as a new patient in May 2018 by Dr. Shaune Leeks, he has a history of severe persistent asthma.  He was continued on all of his breathing medications which included Pulmicort 0.25 mg twice daily as well as Brovana 15 mcg twice daily.  He had skin testing that was positive to grasses, weeds, trees, cat, dog, dust mite, and cockroach.  He was started on a prednisone burst.  Xolair initiation was discussed, but it is not clear whether  this was started.  He was continued on loratadine 10 mg daily as well as Flonase 2 sprays per nostril daily.  An alpha gal panel as well as a stinging insect panel was ordered, but it is not clear that this was ever collected.  Since the last visit, he has done well. Allergies have been under good control overall, which is why he has never followed back up since being seen in May 2018. However, he now needs a refill of his prednisone and is no longer seeing a pulmonologist, therefore he follows up here for a visit.   Evidently he was kicked out of his last pulmonology practice due to non compliance, at least according to Mr. Quast. He has a history of OSA but has refused sleep studies and CPAPAs in the past. His last visit with a pulmonologist was with Dr. Dionne Milo at [facility] on December 26th, 2018. Per that note. Dr. Michela Pitcher was recommending initiating Dupixent instead of the Berna Bue since he had improvement with his breathing after the Berna Bue was given. It is unclear whether Dupixent was ever started, however. There was discussion about performing bronchial thermoplasty as well if the [place] did not improve his breathing. He has  been referred to Pulmonary Rehab in the past. Prior to following with Dr. Michela Pitcher, Mr. Newby was seen by Dr. Elsworth Soho.   Asthma/Respiratory Symptom History: Mr. Olivencia has had asthma for his entire life. He does have a history of polio when he was younger as well. He was treated for TB in 1984 (unclear if this was active TB or latent TB). He is on Brovana twice daily as well as the [place]ide twice daily. He does albuterol in the middle of the day and occasionally at night. He is on O2 at night. He was on Spiriva at one point, but did not feel that it provided any benefit. He was seeing a Technical brewer and he has fired two Technical brewer since Dr. Shaune Leeks saw him last time. He is also on prednisone 10mg  daily for 4-5 years. This was started initially at 40mg  daily. It was  recommended that he wean to 5 mg at one pont, but he did not tolerate this (unable to tell me exactly what happened). He was on Fasenra at one point. He did have hives 8 hours afterwards. But he was labeled as an allergic reaction to it and is not interested in restarting it. He did feel that it helped his breathing. He never did start Xolair. He does have a history of smoking.   He has had anaphylaxis to the flu vaccine in December 2018. He also reacted with hives to [place], although they did not start for several hours after administration. He has prostate cancer. He also has bipolar disorder and explosive personality disorder, according to his problem list. He does have gout as well and is followed by Dr. Lollie Sails.    Otherwise, there have been no changes to his past medical history, surgical history, family history, or social history.  COMPARISON: June 05, 2017 INDICATION: hemoptysis TECHNIQUE:  CT CHEST WO IV CONTRAST -  Contrast: Radiation dose reduction was utilized (automated exposure control, mA or kV adjustment based on patient size, or iterative image reconstruction).  Exam date/time: 04/07/2018 10:17 AM   FINDINGS:   LUNGS/PLEURA: 16 mm faint groundglass nodule left upper lobe image 41. Punctate left lower lobe pulmonary nodule image 47 unchanged. Punctate left lower lobe pulmonary nodule image 60 unchanged. Trace infiltrate versus atelectasis left lower lobe posteriorly image 66. No pneumothorax.  No abnormal pulmonary masses.  No pleural effusions.  HEART/MEDIASTINUM: Cardiac size is normal. No acute thoracic aortic abnormalities.  No hilar, mediastinal, or axillary lymphadenopathy.  MUSCULOSKELETAL: No acute or destructive osseous processes.  CHRONIC/INCIDENTAL FINDINGS: Cholecystectomy  IMPRESSION: 1.  Minimal right lower lobe infiltrate versus atelectasis. 2.  Stable punctate pulmonary nodules     Review of Systems: a 14-point review of systems is pertinent  for what is mentioned in HPI.  Otherwise, all other systems were negative. Constitutional: negative other than that listed in the HPI Eyes: negative other than that listed in the HPI Ears, nose, mouth, throat, and face: negative other than that listed in the HPI Respiratory: negative other than that listed in the HPI Cardiovascular: negative other than that listed in the HPI Gastrointestinal: negative other than that listed in the HPI Genitourinary: negative other than that listed in the HPI Integument: negative other than that listed in the HPI Hematologic: negative other than that listed in the HPI Musculoskeletal: negative other than that listed in the HPI Neurological: negative other than that listed in the HPI Allergy/Immunologic: negative other than that listed in the HPI    Objective:   Blood pressure 136/80,  pulse 60, temperature 98.4 F (36.9 C), temperature source Oral, resp. rate 20, SpO2 96 %. There is no height or weight on file to calculate BMI.   Physical Exam:  General: Alert, interactive, in no acute distress. Rude at times.  Eyes: No conjunctival injection bilaterally, no discharge on the right, no discharge on the left and no Horner-Trantas dots present. PERRL bilaterally. EOMI without pain. No photophobia.  Ears: Right TM pearly gray with normal light reflex, Left TM pearly gray with normal light reflex, Right TM intact without perforation and Left TM intact without perforation.  Nose/Throat: External nose within normal limits and septum midline. Turbinates edematous and pale with clear discharge. Posterior oropharynx mildly erythematous without cobblestoning in the posterior oropharynx. Tonsils 2+ without exudates.  Tongue without thrush. Lungs: Decreased breath sounds bilaterally without wheezing, rhonchi or rales. No increased work of breathing. CV: Normal S1/S2. No murmurs. Capillary refill <2 seconds.  Skin: Warm and dry, without lesions or rashes. Neuro:    Grossly intact. No focal deficits appreciated. Responsive to questions.  Diagnostic studies:   Spirometry: results abnormal (FEV1: 1.46/42%, FVC: 2.38/51%, FEV1/FVC: 61%).    Spirometry consistent with mixed obstructive and restrictive disease. Albuterol/Atrovent nebulizer treatment given in clinic with significant improvement in FEV1 and FVC per ATS criteria. However, the numbers never normalized.   Allergy Studies: none      [name], MD  [facility] [place]

## 2018-05-30 NOTE — Patient Instructions (Addendum)
1. Severe persistent asthma with COPD overlap - Lung testing looks terrible today, but it did improve slightly with the albuterol treatment.  - We will refill your prednisone, but I really would recommend adding on a biologic medication.  - We will add on Nucala to target the eosinophils in your lungs. - Take a Zyrtec (cetirizine) 10mg  on the day of the injection and a few days after to make sure you tolerate it well.  - You will be contacted by Tammy at our office who manages all of our biologic medications.  - Daily controller medication(s): Brovana one treatment twice daily + Pulmicort 0.25mg  one treatment twice daily + prednisone 10mg  once daily - Prior to physical activity: ProAir 2 puffs 10-15 minutes before physical activity. - Rescue medications: albuterol nebulizer one vial every 4-6 hours as needed or DuoNeb nebulizer one vial every 4-6 hours as needed - Asthma control goals:  * Full participation in all desired activities (may need albuterol before activity) * Albuterol use two time or less a week on average (not counting use with activity) * Cough interfering with sleep two time or less a month * Oral steroids no more than once a year * No hospitalizations  2. Perennial and seasonal allergic rhinitis - Continue with loratadine 10mg  daily as needed. - Continue with fluticasone 1-2 sprays per nostril daily as needed.  3. Return in about 4 weeks (around 06/27/2018). It is mportant to keep regular follow up appointments so that we can adequately control your symptoms.    Please inform us of any Emergency Department visits, hospitalizations, or changes in symptoms. Call us before going to the ED for breathing or allergy symptoms since we might be able to fit you in for a sick visit. Feel free to contact us anytime with any questions, problems, or concerns.  It was a pleasure to meet you today!  Websites that have reliable patient information: 1. American Academy of Asthma, Allergy,  and Immunology: www.aaaai.org 2. Food Allergy Research and Education (FARE): foodallergy.org 3. Mothers of Asthmatics: http://www.asthmacommunitynetwork.org 4. American College of Allergy, Asthma, and Immunology: MonthlyElectricBill.co.uk   Make sure you are registered to vote! If you have moved or changed any of your contact information, you will need to get this updated before voting!

## 2018-06-01 ENCOUNTER — Encounter: Payer: Self-pay | Admitting: Allergy & Immunology

## 2018-06-03 ENCOUNTER — Telehealth: Payer: Self-pay | Admitting: *Deleted

## 2018-06-03 NOTE — Telephone Encounter (Signed)
[name] needs a return call regarding his nucala, he said he spoke with [place] and has some questions. Already spoke to [facility] and she will reach out.

## 2018-06-04 ENCOUNTER — Other Ambulatory Visit: Payer: Self-pay | Admitting: Allergy

## 2018-06-09 ENCOUNTER — Other Ambulatory Visit: Payer: Self-pay | Admitting: Allergy

## 2018-06-09 ENCOUNTER — Ambulatory Visit: Payer: Medicare Other | Admitting: Pediatrics

## 2018-06-09 ENCOUNTER — Telehealth: Payer: Self-pay | Admitting: Allergy

## 2018-06-09 MED ORDER — ALBUTEROL SULFATE HFA 108 (90 BASE) MCG/ACT IN AERS: 2.0000 | INHALATION_SPRAY | RESPIRATORY_TRACT | 2 refills | Status: DC | PRN

## 2018-06-09 MED ORDER — ALBUTEROL SULFATE HFA 108 (90 BASE) MCG/ACT IN AERS: 2.0000 | INHALATION_SPRAY | RESPIRATORY_TRACT | 3 refills | Status: DC | PRN

## 2018-06-09 MED ORDER — ALBUTEROL SULFATE HFA 108 (90 BASE) MCG/ACT IN AERS: 2.0000 | INHALATION_SPRAY | Freq: Four times a day (QID) | RESPIRATORY_TRACT | 2 refills | Status: DC | PRN

## 2018-06-09 NOTE — Telephone Encounter (Signed)
[name], I talked with [name] this morning and informed him I had faxed in prescription for the Proventil. He said when he used the Proventil it made his tongue and throat swell. I cancelled the Proventil and  Faxed Pro-Air back in. Insurance will not cover Pro-Air. I need in dication where he say he can not use proventil so we can try and get approved. Thanks.

## 2018-06-09 NOTE — Telephone Encounter (Signed)
Left message for patient that I sent in the Proventil HFA. Insurance would not pay for Pro-Air. Sent in to [employer].

## 2018-06-09 NOTE — Telephone Encounter (Signed)
Noted. Would a letter suffice? Or just a telephone note?   [name], MD [facility] [place] [facility] [place]

## 2018-06-10 ENCOUNTER — Telehealth: Payer: Self-pay | Admitting: Allergy

## 2018-06-10 ENCOUNTER — Other Ambulatory Visit: Payer: Self-pay | Admitting: Allergy

## 2018-06-10 MED ORDER — ALBUTEROL SULFATE HFA 108 (90 BASE) MCG/ACT IN AERS: 2.0000 | INHALATION_SPRAY | RESPIRATORY_TRACT | 3 refills | Status: DC | PRN

## 2018-06-10 NOTE — Telephone Encounter (Signed)
Approved 90 supply of albuterol HFA Pro-Air.

## 2018-06-10 NOTE — Telephone Encounter (Signed)
I got the Pro-Air approved for him.

## 2018-06-10 NOTE — Telephone Encounter (Signed)
Called [facility] and cancelled Pro-Air. Sent to [employer].

## 2018-06-10 NOTE — Telephone Encounter (Signed)
Called patient and let him know Pro-Air has been approved. Faxed prescription to [facility]

## 2018-06-25 ENCOUNTER — Telehealth: Payer: Self-pay

## 2018-06-25 NOTE — Telephone Encounter (Signed)
Patient on prednisone 10 mg daily for asthma.  Patient dx with gout over the weekend.  Primary care doctor increased prednisone to 30 mg a day x 6 days.  (Patient cannot tolerate NSAID'S).  Wanted to make sure the amount of prednisone is ok with Dr. Ernst Bowler. Primary care doctor suggested he call allergist. Please advise.

## 2018-06-26 NOTE — Telephone Encounter (Signed)
That is fine with me. Where are we with his Nucala approval?   [name], MD [facility] [place] [facility] [place]

## 2018-06-26 NOTE — Telephone Encounter (Signed)
I have spoken to patient regarding affordability and mailed him paperwork to try and get on free drug due to his cost out of pocket but have not received paperwork back.  That was on 06/02/18

## 2018-07-04 ENCOUNTER — Ambulatory Visit: Payer: Medicare HMO | Admitting: Allergy & Immunology

## 2018-07-07 ENCOUNTER — Encounter: Payer: Self-pay | Admitting: Allergy & Immunology

## 2018-07-07 ENCOUNTER — Ambulatory Visit: Payer: Medicare HMO | Admitting: Allergy & Immunology

## 2018-07-07 VITALS — BP 146/94 | HR 60 | Temp 98.1°F | Resp 20

## 2018-07-07 DIAGNOSIS — G4733 Obstructive sleep apnea (adult) (pediatric): Secondary | ICD-10-CM | POA: Diagnosis not present

## 2018-07-07 DIAGNOSIS — J449 Chronic obstructive pulmonary disease, unspecified: Secondary | ICD-10-CM

## 2018-07-07 DIAGNOSIS — J302 Other seasonal allergic rhinitis: Secondary | ICD-10-CM

## 2018-07-07 DIAGNOSIS — K219 Gastro-esophageal reflux disease without esophagitis: Secondary | ICD-10-CM

## 2018-07-07 DIAGNOSIS — J3089 Other allergic rhinitis: Secondary | ICD-10-CM | POA: Diagnosis not present

## 2018-07-07 MED ORDER — OMEPRAZOLE 40 MG PO CPDR: 40.0000 mg | DELAYED_RELEASE_CAPSULE | Freq: Every day | ORAL | 5 refills | Status: DC

## 2018-07-07 NOTE — Progress Notes (Signed)
FOLLOW UP  Date of Service/Encounter:  07/07/18   Assessment:   Severe persistent asthma with COPD overlap  Chronic prednisone use (5+ years) - currently on 10mg  daily  Seasonal and perennial allergic rhinitis (grasses, weeds, trees, cat, dog, dust mite, and cockroach)  History of non-compliance  Pulmonary nodules (stable per last chest CT June 2019)  Complex medical history, including bipolar depression  GERD - not on a controller medication at this time  OSA - with history of non-compliance with CPAP    Asthma Reportables:  Severity: severe persistent  Risk: high Control: very poorly controlled    Plan/Recommendations:   1. Severe persistent asthma with COPD overlap - I do not think that more prednisone is needed today. - Nucala would help tremendously, so please get that information that [name] needs and return it to her (payments to pharmacy this year and/or a tax return or social security statement of benefits for you and your spouse) - Daily controller medication(s): Brovana one treatment twice daily + Pulmicort 0.25mg  one treatment twice daily + prednisone 10mg  once daily - Prior to physical activity: ProAir 2 puffs 10-15 minutes before physical activity. - Rescue medications: albuterol nebulizer one vial every 4-6 hours as needed or DuoNeb nebulizer one vial every 4-6 hours as needed - Asthma control goals:  * Full participation in all desired activities (may need albuterol before activity) * Albuterol use two time or less a week on average (not counting use with activity) * Cough interfering with sleep two time or less a month * Oral steroids no more than once a year * No hospitalizations  2. Perennial and seasonal allergic rhinitis - Continue with loratadine 10mg  daily as needed. - Continue with fluticasone 1-2 sprays per nostril daily as needed.  3. GERD - I would start omeprazole 40mg  daily to control your reflux. - This could be  contributing to your night time symptoms.   4. Obstructive sleep apnea - We will refer you to Pulmonology so get the necessary workup for starting CPAP. - This can be making your breathing worse as well.   5. Return in about 3 months (around 10/06/2018).  Subjective:   [name] is a 67 y.o. male presenting today for follow up of  Chief Complaint  Patient presents with  . Asthma    fever last week. did not check temp, felt it. did have chills and body aches. ?flu? per patient. some loose BM. some nausea. no known exposure to illness.     [name] has a history of the following: Patient Active Problem List   Diagnosis Date Noted  . Severe persistent asthma without complication 30/16/0109  . Bee sting-induced anaphylaxis 02/12/2017  . Anaphylactic reaction due to food, initial encounter 02/12/2017  . Current use of beta blocker 02/12/2017  . Allergic rhinitis 01/15/2017  . COPD exacerbation ([place]) 11/04/2016  . Asthma exacerbation 11/03/2016  . Nausea without vomiting 09/17/2016  . Shortness of breath   . Diabetes mellitus type 2 in obese ([place]) 06/26/2016  . non-specific Chest pain 06/12/2016  . Patient's noncompliance with other medical treatment and regimen 06/02/2016  . Hyperlipidemia, mixed 06/02/2016  . Elevated liver enzymes  06/02/2016  . Schizophrenia ([place]) 04/30/2016  . Environmental and seasonal allergies 04/30/2016  . Diabetes mellitus without complication- (diet controlled; w/o proteinuria) 04/30/2016  . Heavy alcohol use- Many yrs  04/30/2016  . Basal cell carcinoma of back 04/30/2016  . Obesity 04/30/2016  . Hx of multiple concussions 04/30/2016  .  h/o Vitamin D deficiency 04/30/2016  . Screen for sexually transmitted diseases 04/30/2016  . Immunocompromised state ([place]) 04/30/2016  . Cervical radiculopathy 01/15/2016  . Alcohol withdrawal ([place]) 11/22/2015  . Alcohol abuse with alcohol-induced mood disorder ([place]) 11/22/2015  . h/o Suicidal  thoughts 11/21/2015  . Dry mouth 10/07/2015  . Anxiety 09/19/2015  . OAB (overactive bladder) 07/27/2015  . Chronic idiopathic constipation 05/08/2015  . Screening for colon cancer   . Benign neoplasm of cecum   . Benign neoplasm of ascending colon   . Benign neoplasm of transverse colon   . Benign neoplasm of sigmoid colon   . Rectal polyp   . Gastroesophageal reflux disease without esophagitis   . Esophageal stricture   . Joint stiffness of multiple sites 10/17/2014  . Benign localized hyperplasia of prostate with urinary obstruction 09/29/2014  . concerns for memory loss 09/13/2014  . Tuberculosis   . Diverticulum of bladder 06/17/2014  . Neuropathy ([place]) 04/11/2014  . Post-polio syndrome 01/13/2014  . Other emphysema ([place]) 01/13/2014  . Hypertension 01/13/2014  . Former smoker 01/13/2014  . h/o Prostate cancer 01/13/2014  . History of tuberculosis 01/13/2014  . Bipolar disorder ([place]) 01/13/2014  . Explosive personality disorder ([place]) 01/13/2014  . Erectile dysfunction 01/13/2014  . Nephrolithiasis 01/13/2014  . Chronic pain 01/13/2014  . Alcoholism ([place]) 01/13/2014  . OSA (obstructive sleep apnea) 01/13/2014    History obtained from: chart review and patient.  [name]'s Primary Care Provider is Jonathon Bacon, NP.     [name] is a 67 y.o. male presenting for a follow up visit.  I last saw him in August 2019.  That was the first time I saw him.  He has a history of severe persistent asthma with COPD, and he did improve with albuterol nebulizer.  We did continue him on prednisone 10 mg, but we discussed adding a biologic to help wean him from the prednisone.  We attempted to get him approved for Nucala however, he has not returned some paperwork to [facility].  We continued him on Brovana 1 treatment twice daily, Pulmicort 0.25 mg twice daily, and his prednisone.  He has a history of perennial and seasonal allergic rhinitis.  We continued Claritin and Flonase.  Since the  last visit, he has not done well. He reports that he was having diarrhea with some nausea.  He has been afebrile this entire time.  He was also endorsing some body aches.  He did go to see his PCP last week on Friday, and his prednisone was increased temporarily to 40 mg daily.  He was diagnosed with a gout exacerbation.  In the midst of all this, he does report that he is using his albuterol nebulizer more at night.  He wakes up around 2-3 times per night really short of breath.  He does use his albuterol nebulizer with some improvement in his symptoms.  He is wondering why his albuterol use has increased so much.  He does have a history of sleep apnea and did have a CPAP machine, which she never used.  Because he never used it, the home health provider removed the CPAP from his home.  This was several years ago.  It was prescribed by a pulmonologist in [place], whom Mr. Krontz no longer sees.  Of note, he also has a history of reflux.  He is not on any reflux medications and has not been on any for several years.  He is open to restarting this.  He  remains in all of his nebulizer medications including Brovana twice a day and Pulmicort twice a day.  He was told by [name] that he cannot afford the mepolizumab, so he did not bother providing her with any income information.  Evidently, he did have a supplemental insurance plan but dropped it due to the out-of-pocket cost.  He is willing to get another supplemental plan in the new year, but until then he will not have any good drug coverage.  I did talk to [name] this morning, and she tells me that she is waiting to get information back from him regarding his income and pharmacy expenses.  She is hoping to get him discounted drug.  His allergic rhinitis remains fairly well controlled with Claritin and Flonase.  He denies sinus pain or nasal discharge today.  Otherwise, there have been no changes to his past medical history, surgical history, family  history, or social history.  He does make several references to waking up with a Corona and going to bed with a Modelo, but reports he only drinks 2 alcoholic beverages daily.    Review of Systems: a 14-point review of systems is pertinent for what is mentioned in HPI.  Otherwise, all other systems were negative. Constitutional: negative other than that listed in the HPI Eyes: negative other than that listed in the HPI Ears, nose, mouth, throat, and face: negative other than that listed in the HPI Respiratory: negative other than that listed in the HPI Cardiovascular: negative other than that listed in the HPI Gastrointestinal: negative other than that listed in the HPI Genitourinary: negative other than that listed in the HPI Integument: negative other than that listed in the HPI Hematologic: negative other than that listed in the HPI Musculoskeletal: negative other than that listed in the HPI Neurological: negative other than that listed in the HPI Allergy/Immunologic: negative other than that listed in the HPI    Objective:   Blood pressure (!) 146/94, pulse 60, temperature 98.1 F (36.7 C), temperature source Oral, resp. rate 20, SpO2 96 %. There is no height or weight on file to calculate BMI.   Physical Exam:  General: Alert, interactive, in no acute distress. Not quite a rude today.  Eyes: No conjunctival injection bilaterally, no discharge on the right, no discharge on the left and no Horner-Trantas dots present. PERRL bilaterally. EOMI without pain. No photophobia.  Ears: Right TM pearly gray with normal light reflex, Left TM pearly gray with normal light reflex, Right TM intact without perforation and Left TM intact without perforation.  Nose/Throat: External nose within normal limits and septum midline. Turbinates edematous and pale with clear discharge. Posterior oropharynx erythematous without cobblestoning in the posterior oropharynx. Tonsils 2+ without exudates.  Tongue  without thrush. Lungs: Decreased breath sounds with expiratory wheezing bilaterally. No increased work of breathing. CV: Normal S1/S2. No murmurs. Capillary refill <2 seconds.  Skin: Warm and dry, without lesions or rashes. Neuro:   Grossly intact. No focal deficits appreciated. Responsive to questions.  Diagnostic studies: none (declined spirometry)      [name], MD  [facility] [place] [facility]

## 2018-07-07 NOTE — Patient Instructions (Addendum)
1. Severe persistent asthma with COPD overlap - I do not think that more prednisone is needed today. - Nucala would help tremendously, so please get that information that Tammy needs and return it to her (payments to pharmacy this year and/or a tax return or social security statement of benefits for you and your spouse) - Daily controller medication(s): Brovana one treatment twice daily + Pulmicort 0.25mg  one treatment twice daily + prednisone 10mg  once daily - Prior to physical activity: ProAir 2 puffs 10-15 minutes before physical activity. - Rescue medications: albuterol nebulizer one vial every 4-6 hours as needed or DuoNeb nebulizer one vial every 4-6 hours as needed - Asthma control goals:  * Full participation in all desired activities (may need albuterol before activity) * Albuterol use two time or less a week on average (not counting use with activity) * Cough interfering with sleep two time or less a month * Oral steroids no more than once a year * No hospitalizations  2. Perennial and seasonal allergic rhinitis - Continue with loratadine 10mg  daily as needed. - Continue with fluticasone 1-2 sprays per nostril daily as needed.  3. GERD - I would start omeprazole 40mg  daily to control your reflux. - This could be contributing to your night time symptoms.   4. Obstructive sleep apnea - We will refer you to Pulmonology so get the necessary workup for starting CPAP. - This can be making your breathing worse as well.   5. Return in about 3 months (around 10/06/2018).   Please inform us of any Emergency Department visits, hospitalizations, or changes in symptoms. Call us before going to the ED for breathing or allergy symptoms since we might be able to fit you in for a sick visit. Feel free to contact us anytime with any questions, problems, or concerns.  It was a pleasure to see you again today!  Websites that have reliable patient information: 1. American Academy of Asthma,  Allergy, and Immunology: www.aaaai.org 2. Food Allergy Research and Education (FARE): foodallergy.org 3. Mothers of Asthmatics: http://www.asthmacommunitynetwork.org 4. American College of Allergy, Asthma, and Immunology: MonthlyElectricBill.co.uk   Make sure you are registered to vote! If you have moved or changed any of your contact information, you will need to get this updated before voting!

## 2018-07-08 NOTE — Progress Notes (Signed)
Thanks it is in the right [employer]. Their office will contact the patient.

## 2018-07-21 ENCOUNTER — Ambulatory Visit: Payer: Self-pay | Admitting: Nurse Practitioner

## 2018-07-22 NOTE — Progress Notes (Signed)
Patient NS visit with Pulmonologist.

## 2018-07-25 ENCOUNTER — Encounter (HOSPITAL_COMMUNITY): Payer: Self-pay

## 2018-07-25 ENCOUNTER — Ambulatory Visit: Payer: Medicare HMO | Admitting: Allergy & Immunology

## 2018-07-25 ENCOUNTER — Emergency Department (HOSPITAL_COMMUNITY): Payer: Medicare HMO

## 2018-07-25 ENCOUNTER — Emergency Department (HOSPITAL_COMMUNITY)
Admission: EM | Admit: 2018-07-25 | Discharge: 2018-07-25 | Disposition: A | Discharge status: Home / Self Care | Payer: Medicare HMO | Attending: Emergency Medicine | Admitting: Emergency Medicine

## 2018-07-25 ENCOUNTER — Other Ambulatory Visit: Payer: Self-pay

## 2018-07-25 DIAGNOSIS — Z79899 Other long term (current) drug therapy: Secondary | ICD-10-CM | POA: Insufficient documentation

## 2018-07-25 DIAGNOSIS — Z85828 Personal history of other malignant neoplasm of skin: Secondary | ICD-10-CM | POA: Diagnosis not present

## 2018-07-25 DIAGNOSIS — Z87891 Personal history of nicotine dependence: Secondary | ICD-10-CM | POA: Insufficient documentation

## 2018-07-25 DIAGNOSIS — R072 Precordial pain: Secondary | ICD-10-CM | POA: Diagnosis not present

## 2018-07-25 DIAGNOSIS — I10 Essential (primary) hypertension: Secondary | ICD-10-CM | POA: Diagnosis not present

## 2018-07-25 DIAGNOSIS — J449 Chronic obstructive pulmonary disease, unspecified: Secondary | ICD-10-CM | POA: Insufficient documentation

## 2018-07-25 DIAGNOSIS — R079 Chest pain, unspecified: Secondary | ICD-10-CM | POA: Diagnosis present

## 2018-07-25 DIAGNOSIS — E119 Type 2 diabetes mellitus without complications: Secondary | ICD-10-CM | POA: Diagnosis not present

## 2018-07-25 LAB — BASIC METABOLIC PANEL
Anion gap: 13 (ref 5–15)
BUN: 12 mg/dL (ref 8–23)
CHLORIDE: 101 mmol/L (ref 98–111)
CO2: 24 mmol/L (ref 22–32)
Calcium: 9 mg/dL (ref 8.9–10.3)
Creatinine, Ser: 1.15 mg/dL (ref 0.61–1.24)
GFR calc Af Amer: >60 mL/min (ref >60)
GFR calc non Af Amer: >60 mL/min (ref >60)
Glucose, Bld: 96 mg/dL (ref 70–99)
POTASSIUM: 4.1 mmol/L (ref 3.5–5.1)
SODIUM: 138 mmol/L (ref 135–145)

## 2018-07-25 LAB — CBC
HEMATOCRIT: 44.7 % (ref 39.0–52.0)
HEMOGLOBIN: 14.4 g/dL (ref 13.0–17.0)
MCH: 32.1 pg (ref 26.0–34.0)
MCHC: 32.2 g/dL (ref 30.0–36.0)
MCV: 99.8 fL (ref 80.0–100.0)
NRBC: 0 % (ref 0.0–0.2)
Platelets: 216 10*3/uL (ref 150–400)
RBC: 4.48 MIL/uL (ref 4.22–5.81)
RDW: 12.9 % (ref 11.5–15.5)
WBC: 7 10*3/uL (ref 4.0–10.5)

## 2018-07-25 LAB — POCT I-STAT TROPONIN I
TROPONIN I, POC: 0.01 ng/mL (ref 0.00–0.08)
Troponin i, poc: 0 ng/mL (ref 0.00–0.08)

## 2018-07-25 NOTE — ED Triage Notes (Signed)
Patient c/o intermittent left center chest pain x 1 week. Patient states [place] the [place] and felt like he was going to pass out. patient went to his PCP today because he was having more frequent chest pain and "chest pain was not gong away." Patient reports a history of atrial fib.

## 2018-07-25 NOTE — Discharge Instructions (Addendum)
Chest pain pattern of the past week in the near syncopal episodes of some concern.  Did recommend admission must return for any new or worse symptoms.  Troponin heart markers negative x2.  Do recommend make an appointment to follow-up with cardiology and make an appointment to follow-up with primary care provider.  Do recommend starting to take a baby aspirin a day however it appears that you are allergic to aspirin so you cannot do that.

## 2018-07-25 NOTE — ED Provider Notes (Signed)
[facility] DEPT Provider Note   CSN: [id number] Arrival date & time: 07/25/18  1803     History   Chief Complaint Chief Complaint  Patient presents with  . Chest Pain    HPI [name] is a 67 y.o. male.  Patient gives a history of 4 episodes of chest pain over the past week.  One was approximately a week ago substernal brief in nature.  Yesterday had an episode that lasted for an hour.  And then today had to episodes first 1 in the morning lasted for about hour and then it reoccurred around 2 PM this afternoon and may be lasted for approximately 1 hour as well.  Not exertional in nature.  Some of the episodes have been associated with a feeling of near syncope but he has not passed out.  No shortness of breath.  However patient does have COPD.  No worsening of his shortness of breath no nausea or vomiting no diaphoresis.  Patient did have a cardiac stress test in 2015 without any significant findings.  In the past has been followed by Dr. Stanford Breed from cardiology but has not seen him for about 3 years.  Patient is asymptomatic now.  Has been asymptomatic since he got here.  Patient did go to a primary care provider and they referred him in for further evaluation.     Past Medical History:  Diagnosis Date  . Anxiety disorder   . Arthritis    BILATERAL SHOULDERS, ELBOWS AND HANDS AND LEFT HIP AND KNEES--HX CORTISONE SHOTS IN SHOULDERS, ELBOWS, HIP AND KNEES  . Basal cell carcinoma of back 04/30/2016   Dermatologist- Dr Nevada Crane;   MOHS sx- Dr Levada Dy   . Bladder outlet obstruction   . Chronic idiopathic constipation 05/08/2015  . Complication of anesthesia    DIFFICULT WAKING   . COPD (chronic obstructive pulmonary disease) Gold C Frequent exacerbations 01/13/2014   [name] 07/08/14: FeV1 51% FeV1/FVC 66% FVC 59% 10/5/2015ONO RA was normal  10/13/2014  ONO on RA NORMAL   . COPD, frequent exacerbations ([place])    pulmologist-  dr Joya Gaskins--  [place] Rm Stage  C.04-25-15 recent COPD exacerbation-much improved now, after tx. in ER [facility].  Marland Kitchen Depression   . Diabetes mellitus without complication ([place])    BODERLINE - DIET CONTROL  . Dysrhythmia    PVC'S  . Emphysema lung ([place])    stage 2  . Family history of adverse reaction to anesthesia    father would wake up with agitation   . Former smoker 01/13/2014  . Gastroesophageal reflux disease without esophagitis   . Heavy alcohol use 04/30/2016  . History of chronic bronchitis   . History of oxygen administration    oxygen use 2 l/m nasally at bedtime and exertional occasions  . History of rheumatic fever   . History of TB (tuberculosis)    1984--  hospitalized for 4 month treatment  . History of urinary retention   . Hx of multiple concussions    x 2 per patient   . Hypertension   . Melanoma ([place])   . Nocturnal oxygen desaturation    USES O2 NIGHTLY  . OSA (obstructive sleep apnea) 01/13/2014  . PONV (postoperative nausea and vomiting)   . Post-polio syndrome    polio at age 53--PT WAS IN IRON LUNG; PT WAS IN W/C UNTIL AGE 38; STILL HAS WEAKNESS RIGHT SIDE  . Prostate cancer ([place])   . Schizophrenia ([place])   . Tuberculosis  Hosp 4 months rx , left early   . Urticaria     Patient Active Problem List   Diagnosis Date Noted  . COPD with asthma ([place]) 07/07/2018  . Severe persistent asthma without complication 19/50/9326  . Bee sting-induced anaphylaxis 02/12/2017  . Anaphylactic reaction due to food, initial encounter 02/12/2017  . Current use of beta blocker 02/12/2017  . Allergic rhinitis 01/15/2017  . COPD exacerbation ([place]) 11/04/2016  . Asthma exacerbation 11/03/2016  . Nausea without vomiting 09/17/2016  . Shortness of breath   . Diabetes mellitus type 2 in obese ([place]) 06/26/2016  . non-specific Chest pain 06/12/2016  . Patient's noncompliance with other medical treatment and regimen 06/02/2016  . Hyperlipidemia, mixed 06/02/2016  . Elevated liver enzymes  06/02/2016  .  Schizophrenia ([place]) 04/30/2016  . Environmental and seasonal allergies 04/30/2016  . Diabetes mellitus without complication- (diet controlled; w/o proteinuria) 04/30/2016  . Heavy alcohol use- Many yrs  04/30/2016  . Basal cell carcinoma of back 04/30/2016  . Obesity 04/30/2016  . Hx of multiple concussions 04/30/2016  . h/o Vitamin D deficiency 04/30/2016  . Screen for sexually transmitted diseases 04/30/2016  . Immunocompromised state ([place]) 04/30/2016  . Cervical radiculopathy 01/15/2016  . Alcohol withdrawal ([place]) 11/22/2015  . Alcohol abuse with alcohol-induced mood disorder ([place]) 11/22/2015  . h/o Suicidal thoughts 11/21/2015  . Dry mouth 10/07/2015  . Anxiety 09/19/2015  . OAB (overactive bladder) 07/27/2015  . Chronic idiopathic constipation 05/08/2015  . Screening for colon cancer   . Benign neoplasm of cecum   . Benign neoplasm of ascending colon   . Benign neoplasm of transverse colon   . Benign neoplasm of sigmoid colon   . Rectal polyp   . Gastroesophageal reflux disease without esophagitis   . Esophageal stricture   . Joint stiffness of multiple sites 10/17/2014  . Benign localized hyperplasia of prostate with urinary obstruction 09/29/2014  . concerns for memory loss 09/13/2014  . Tuberculosis   . Diverticulum of bladder 06/17/2014  . Neuropathy ([place]) 04/11/2014  . Post-polio syndrome 01/13/2014  . Other emphysema ([place]) 01/13/2014  . Hypertension 01/13/2014  . Former smoker 01/13/2014  . h/o Prostate cancer 01/13/2014  . History of tuberculosis 01/13/2014  . Bipolar disorder ([place]) 01/13/2014  . Explosive personality disorder ([place]) 01/13/2014  . Erectile dysfunction 01/13/2014  . Nephrolithiasis 01/13/2014  . Chronic pain 01/13/2014  . Alcoholism ([place]) 01/13/2014  . Obstructive sleep apnea 01/13/2014    Past Surgical History:  Procedure Laterality Date  . CARDIOVASCULAR STRESS TEST  06-08-2014  dr Mare Ferrari   normal lexiscan study/  no ischemia/  not  gated due to PAC's  . COLONOSCOPY N/A 05/03/2015   Procedure: COLONOSCOPY;  Surgeon: [name], MD;  Location: [facility];  Service: Endoscopy;  Laterality: N/A;  . CYSTOSCOPY N/A 10/25/2015   Procedure: CYSTOSCOPY;  Surgeon: [name], MD;  Location: [facility];  Service: Urology;  Laterality: N/A;  . CYSTOSCOPY W/ CYSTOGRAM/  TRANSRECTAL ULTRASOUND PROSTATE BX  03-22-2009  . ESOPHAGOGASTRODUODENOSCOPY N/A 03/22/2015   Procedure: ESOPHAGOGASTRODUODENOSCOPY (EGD) with dilation;  Surgeon: [name], MD;  Location: [facility];  Service: Endoscopy;  Laterality: N/A;  . excision of skin lesion    . LAPAROSCOPIC CHOLECYSTECTOMY  2013  . left elbow surgery      due to fracture   . NASAL SEPTUM SURGERY  2000  . OTHER SURGICAL HISTORY      Muscle & bone Graft/Polio  . polio surgeries  14 polio surgeries   . PROSTATE BIOPSY N/A 09/28/2014   Procedure: PROSTATE ULTRASOUND/BIOPSY;  Surgeon: [name], MD;  Location: [facility];  Service: Urology;  Laterality: N/A;  . PROSTATE BIOPSY N/A 10/25/2015   Procedure: PROSTATE BIOPSY AND ULTRASOUND;  Surgeon: [name], MD;  Location: [facility];  Service: Urology;  Laterality: N/A;  . SAVORY DILATION N/A 03/22/2015   Procedure: SAVORY DILATION;  Surgeon: [name], MD;  Location: [facility];  Service: Endoscopy;  Laterality: N/A;  . SHOULDER ARTHROSCOPY WITH OPEN ROTATOR CUFF REPAIR Bilateral 2013  &  1999   removal spurs and bursectomy  . TRANSURETHRAL INCISION OF BLADDER NECK N/A 10/25/2015   Procedure:  TRANSURETHRAL INCISION OF BLADDER NECK;  Surgeon: [name], MD;  Location: [facility];  Service: Urology;  Laterality: N/A;  . TRANSURETHRAL RESECTION OF PROSTATE N/A 09/28/2014   Procedure: TRANSURETHRAL RESECTION OF THE PROSTATE (TURP);  Surgeon: [name], MD;  Location: [facility];  Service: Urology;  Laterality: N/A;  . URETEROSOPY STONE EXTRACTION  2000        Home Medications    Prior to Admission medications   Medication Sig Start Date  End Date Taking? Authorizing Provider  acetaminophen (TYLENOL) 500 MG tablet Take 500 mg by mouth every 6 (six) hours as needed for headache (pain).   Yes [provider]  albuterol (PROAIR HFA) 108 (90 Base) MCG/ACT inhaler Inhale 2 puffs into the lungs every 4 (four) hours as needed for wheezing or shortness of breath. 06/09/18  Yes Valentina Shaggy, MD  albuterol (PROVENTIL) (2.5 MG/3ML) 0.083% nebulizer solution Take 2.5 mg by nebulization every 6 (six) hours as needed for wheezing or shortness of breath.   Yes [provider]  ALPRAZolam [name]) 0.5 MG tablet Take 0.5 mg by mouth daily as needed for anxiety.  02/21/18  Yes [provider]  arformoterol (BROVANA) 15 MCG/2ML NEBU Take 2 mLs (15 mcg total) by nebulization 2 (two) times daily. 05/30/18  Yes Valentina Shaggy, MD  baclofen (LIORESAL) 10 MG tablet Take 10 mg by mouth daily as needed.    Yes [provider]  bisoprolol (ZEBETA) 5 MG tablet Take 5 mg by mouth daily.   Yes [provider]  budesonide (PULMICORT) 0.25 MG/2ML nebulizer solution Take 2 mLs (0.25 mg total) by nebulization 2 (two) times daily. 05/30/18  Yes Valentina Shaggy, MD  dexlansoprazole (DEXILANT) 60 MG capsule Take 1 capsule (60 mg total) by mouth daily. Patient taking differently: Take 60 mg by mouth every other day.  09/17/16  Yes [place], Betty G, MD  EPINEPHrine 0.3 mg/0.3 mL IJ SOAJ injection Inject 0.3 mLs (0.3 mg total) into the muscle once. Patient taking differently: Inject 0.3 mg into the muscle once as needed (severe allergic reactions).  02/24/16  Yes Malvin Johns, MD  fluticasone (FLONASE) 50 MCG/ACT nasal spray Place 1 spray into both nostrils daily as needed. 05/30/18  Yes Valentina Shaggy, MD  loratadine (CLARITIN) 10 MG tablet Take 1 tablet (10 mg total) by mouth as needed. 05/30/18  Yes Valentina Shaggy, MD  Menthol, Topical Analgesic, (BIOFREEZE EX) Apply 1 application topically daily as  needed (knee pain). TO THE LEFT KNEE AS NEEDED FOR PAIN    Yes [provider]  nitroGLYCERIN (NITROSTAT) 0.4 MG SL tablet Place 1 tablet (0.4 mg total) under the tongue every 5 (five) minutes as needed for chest pain. 06/21/15  Yes Brunetta Jeans, PA-C  ondansetron (ZOFRAN ODT) 8  MG disintegrating tablet Take 1 tablet (8 mg total) by mouth every 8 (eight) hours as needed for nausea or vomiting. 03/28/17  Yes Mol[place]us, John, MD  predniSONE (DELTASONE) 10 MG tablet Take 1 tablet (10 mg total) by mouth daily. 05/30/18  Yes Valentina Shaggy, MD  Vitamin D, Ergocalciferol, (DRISDOL) 50000 units CAPS capsule Take 50,000 Units by mouth every 7 (seven) days.   Yes [provider]  OXYGEN Inhale 2 L into the lungs at bedtime. DURING THE DAY IF NEEDED Reported on 11/25/2015    [provider]    Family History Family History  Problem Relation Age of Onset  . Alzheimer's disease Father 4       Deceased  . Stomach cancer Father   . Heart attack Father   . Heart disease Father   . Skin cancer Mother        Facial-Living  . Cancer Mother        skin  . Alcohol abuse Sister        x2  . Mental illness Sister        x2  . Alcohol abuse Sister   . Diabetes Maternal Aunt        x2  . Thyroid disease Maternal Aunt        x4  . Diabetes Maternal Uncle   . Tuberculosis Paternal Grandfather   . Tuberculosis Paternal Grandmother   . Alzheimer's disease Paternal Aunt   . Alzheimer's disease Paternal Uncle   . Colon cancer Neg Hx   . Colon polyps Neg Hx   . Crohn's disease Neg Hx   . Ulcerative colitis Neg Hx   . Allergic rhinitis Neg Hx   . Angioedema Neg Hx   . Asthma Neg Hx   . Eczema Neg Hx   . Urticaria Neg Hx     Social History Social History   Tobacco Use  . Smoking status: Former Smoker    Years: 47.00    Types: Pipe    Last attempt to quit: 08/25/2013    Years since quitting: 4.9  . Smokeless tobacco: Former Systems developer  . Tobacco comment: 5-6 times  smoking a pipe  Substance Use Topics  . Alcohol use: Yes    Alcohol/week: 14.0 standard drinks    Types: 14 Cans of beer per week    Comment: 2 cans daily  . Drug use: No     Allergies   Aspirin; Hornet venom; Ivp dye [iodinated diagnostic agents]; Levaquin [levofloxacin in d5w]; Nsaids; Penicillins; Tolmetin; Fasenra [benralizumab]; Perforomist [formoterol]; Buprenorphine hcl; Morphine and related; and Oxycodone   Review of Systems Review of Systems  Constitutional: Negative for diaphoresis and fever.  HENT: Negative for congestion.   Eyes: Negative for visual disturbance.  Respiratory: Negative for shortness of breath.   Cardiovascular: Positive for chest pain. Negative for palpitations and leg swelling.  Gastrointestinal: Negative for abdominal pain, nausea and vomiting.  Genitourinary: Negative for dysuria.  Musculoskeletal: Negative for back pain.  Skin: Negative for rash.  Neurological: Negative for syncope and headaches.  Hematological: Does not bruise/bleed easily.  Psychiatric/Behavioral: Negative for confusion.     Physical Exam Updated Vital Signs Ht 1.829 m (6')   Wt 105.2 kg   SpO2 96%   BMI 31.46 kg/m   Physical Exam  Constitutional: He appears well-developed and well-nourished. No distress.  HENT:  Head: Normocephalic and atraumatic.  Mouth/Throat: Oropharynx is clear and moist.  Eyes: Pupils are equal, round, and reactive to light. Conjunctivae  and EOM are normal.  Neck: Neck supple.  Cardiovascular: Normal rate, regular rhythm and normal heart sounds.  Pulmonary/Chest: Effort normal and breath sounds normal. No respiratory distress. He exhibits no tenderness.  Abdominal: Soft. Bowel sounds are normal. There is no guarding.  Musculoskeletal: Normal range of motion. He exhibits no edema.  Neurological: He is alert. No cranial nerve deficit or sensory deficit. He exhibits normal muscle tone. Coordination normal.  Skin: Skin is warm. Capillary refill  takes less than 2 seconds. No rash noted.  Nursing note and vitals reviewed.    ED Treatments / Results  Labs (all labs ordered are listed, but only abnormal results are displayed) Labs Reviewed  BASIC METABOLIC PANEL  CBC  I-STAT TROPONIN, ED  POCT I-STAT TROPONIN I  I-STAT TROPONIN, ED  POCT I-STAT TROPONIN I    EKG EKG Interpretation  Date/Time:  Friday July 25 2018 18:11:25 EDT Ventricular Rate:  65 PR Interval:    QRS Duration: 109 QT Interval:  411 QTC Calculation: 428 R Axis:   10 Text Interpretation:  Sinus rhythm Low voltage, precordial leads Artifact No significant change since last tracing Confirmed by Fredia Sorrow [id number]) on 07/25/2018 8:59:42 PM   Radiology Dg Chest 2 View  Result Date: 07/25/2018 CLINICAL DATA:  Generalized chest pain for 4 days. EXAM: CHEST - 2 VIEW COMPARISON:  Chest x-ray dated 01/08/2018. FINDINGS: The heart size and mediastinal contours are within normal limits. Both lungs are clear. The visualized skeletal structures are unremarkable. IMPRESSION: No active cardiopulmonary disease. Electronically Signed   By: Franki Cabot M.D.   On: 07/25/2018 18:58    Procedures Procedures (including critical care time)  Medications Ordered in ED Medications - No data to display   Initial Impression / Assessment and Plan / ED Course  I have reviewed the triage vital signs and the nursing notes.  Pertinent labs & imaging results that were available during my care of the patient were reviewed by me and considered in my medical decision making (see chart for details).     Patient with troponins negative x2 the most recent one was done shortly before 2200.  Which would have been more than 6 hours from the last episode of pain.  However due to the recurrent nature and the escalating nature of one episode yesterday and 2 today.  Did recommend admission.  Patient refused admission I told him that it possibly could be a warning sign from his  heart.  He insist on going home says he will follow-up with cardiology and his primary care provider and will return for any new or worse symptoms.  Patient has an aspirin allergy so he cannot take aspirin.  Chest x-ray was negative labs without significant abnormalities.  EKG had no acute changes.  And also no evidence of any arrhythmia.  Final Clinical Impressions(s) / ED Diagnoses   Final diagnoses:  Precordial pain    ED Discharge Orders    None       Fredia Sorrow, MD 07/25/18 2249

## 2018-07-29 ENCOUNTER — Encounter: Payer: Self-pay | Admitting: Allergy & Immunology

## 2018-07-29 ENCOUNTER — Ambulatory Visit: Payer: Medicare HMO | Admitting: Allergy & Immunology

## 2018-07-29 VITALS — BP 118/70 | HR 74 | Temp 97.6°F | Resp 20 | Ht 71.5 in | Wt 231.0 lb

## 2018-07-29 DIAGNOSIS — K219 Gastro-esophageal reflux disease without esophagitis: Secondary | ICD-10-CM | POA: Diagnosis not present

## 2018-07-29 DIAGNOSIS — G4733 Obstructive sleep apnea (adult) (pediatric): Secondary | ICD-10-CM

## 2018-07-29 DIAGNOSIS — J4550 Severe persistent asthma, uncomplicated: Secondary | ICD-10-CM | POA: Diagnosis not present

## 2018-07-29 DIAGNOSIS — J3089 Other allergic rhinitis: Secondary | ICD-10-CM

## 2018-07-29 DIAGNOSIS — J302 Other seasonal allergic rhinitis: Secondary | ICD-10-CM

## 2018-07-29 DIAGNOSIS — T63441D Toxic effect of venom of bees, accidental (unintentional), subsequent encounter: Secondary | ICD-10-CM

## 2018-07-29 MED ORDER — TIOTROPIUM BROMIDE MONOHYDRATE 2.5 MCG/ACT IN AERS: 1.0000 | INHALATION_SPRAY | RESPIRATORY_TRACT | 5 refills | Status: DC

## 2018-07-29 NOTE — Progress Notes (Signed)
FOLLOW UP  Date of Service/Encounter:  07/29/18   Assessment:   Severe persistent asthmawith COPD overlap  Chronic prednisone use (5+ years) - currently on 10mg  daily (with intermittent self induced bursts up to 20mg  daily)  Seasonal and perennial allergic rhinitis (grasses, weeds, trees, cat, dog, dust mite, and cockroach)  History of non-compliance  Pulmonary nodules (stable per last chest CT June 2019)  Complex medical history, including bipolar depression  GERD - not on a controller medication at this time  OSA - with history of non-compliance with CPAP  Recent episode of chest pain - needs Cardiology evaluation     Asthma Reportables: Severity:severe persistent Risk:high Control:very poorly controlled   [name] continues to drag his feet regarding the initiation of Nucala. While he does not have tax return information, I did offer other options regarding approval of copay assistance and he tells me today that he will get [name] the information. We will reach out again in a few days to see if he has worked on this. Chronic prednisone use with intermittent bursts at home is not a way to manage his pulmonary disease. We again discussed the ill effects of prednisone and the need to get off of this or at least on a lower dose. We are going to add a LAMA to see if this can help to control his disease process more effectively. We will also send his records to Dr. Cruz Condon office to help with their scheduling of an appointment.   I also reminded him of the importance of keeping follow up appointments. I emphasized the need for him to continue to work on getting a Cardiology appointment to get his heart evaluated.    Plan/Recommendations:   1. Severe persistent asthma with COPD overlap - I do not think that more prednisone is needed today. [name] looks about just as terrible as it did last time.  - Nucala would help tremendously, so please get that  information that [name] needs and return it to her (statement from social security that him and his wife get every year stating what their monthly income from social security will be and have the pharmacy print statement for them both from Jan 2019 to present) - We will work on getting [name] approved (this one opens your lungs up using a different receptor than Brovana.  - In the meantime, we will start you on Spiriva 2.5 mcg one puff once daily until we can get the [facility] approved.   - Daily controller medication(s): Brovana one treatment twice daily + Pulmicort 0.25mg  one treatment twice daily + prednisone 10mg  once daily - Prior to physical activity: ProAir 2 puffs 10-15 minutes before physical activity. - Rescue medications: albuterol nebulizer one vial every 4-6 hours as needed or DuoNeb nebulizer one vial every 4-6 hours as needed - Asthma control goals:  * Full participation in all desired activities (may need albuterol before activity) * Albuterol use two time or less a week on average (not counting use with activity) * Cough interfering with sleep two time or less a month * Oral steroids no more than once a year * No hospitalizations  2. Perennial and seasonal allergic rhinitis (grasses, weeds, trees, cat, dog, dust mite, and cockroach) - Continue with loratadine 10mg  daily as needed. - Continue with fluticasone 1-2 sprays per nostril daily as needed. - Can consider the use of   3. GERD - Continue omeprazole 40mg  daily to control your reflux. - This could be contributing to  your night time symptoms.   4. Obstructive sleep apnea - We will send notes to Dr. Cruz Condon office [facility]).  5. Return in about 3 months (around 10/29/2018).   Subjective:   [name] is a 67 y.o. male presenting today for follow up of  Chief Complaint  Patient presents with  . Follow-up    [name] has a history of the following: Patient Active Problem List   Diagnosis Date Noted   . Seasonal and perennial allergic rhinitis 07/30/2018  . COPD with asthma ([place]) 07/07/2018  . Severe persistent asthma without complication 62/69/4854  . Bee sting-induced anaphylaxis 02/12/2017  . Anaphylactic reaction due to food, initial encounter 02/12/2017  . Current use of beta blocker 02/12/2017  . Allergic rhinitis 01/15/2017  . COPD exacerbation ([place]) 11/04/2016  . Asthma exacerbation 11/03/2016  . Nausea without vomiting 09/17/2016  . Shortness of breath   . Diabetes mellitus type 2 in obese ([place]) 06/26/2016  . non-specific Chest pain 06/12/2016  . Patient's noncompliance with other medical treatment and regimen 06/02/2016  . Hyperlipidemia, mixed 06/02/2016  . Elevated liver enzymes  06/02/2016  . Schizophrenia ([place]) 04/30/2016  . Environmental and seasonal allergies 04/30/2016  . Diabetes mellitus without complication- (diet controlled; w/o proteinuria) 04/30/2016  . Heavy alcohol use- Many yrs  04/30/2016  . Basal cell carcinoma of back 04/30/2016  . Obesity 04/30/2016  . Hx of multiple concussions 04/30/2016  . h/o Vitamin D deficiency 04/30/2016  . Screen for sexually transmitted diseases 04/30/2016  . Immunocompromised state ([place]) 04/30/2016  . Cervical radiculopathy 01/15/2016  . Alcohol withdrawal ([place]) 11/22/2015  . Alcohol abuse with alcohol-induced mood disorder ([place]) 11/22/2015  . h/o Suicidal thoughts 11/21/2015  . Dry mouth 10/07/2015  . Anxiety 09/19/2015  . OAB (overactive bladder) 07/27/2015  . Chronic idiopathic constipation 05/08/2015  . Screening for colon cancer   . Benign neoplasm of cecum   . Benign neoplasm of ascending colon   . Benign neoplasm of transverse colon   . Benign neoplasm of sigmoid colon   . Rectal polyp   . Gastroesophageal reflux disease without esophagitis   . Esophageal stricture   . Joint stiffness of multiple sites 10/17/2014  . Benign localized hyperplasia of prostate with urinary obstruction 09/29/2014  . concerns  for memory loss 09/13/2014  . Tuberculosis   . Diverticulum of bladder 06/17/2014  . Neuropathy ([place]) 04/11/2014  . Post-polio syndrome 01/13/2014  . Other emphysema ([place]) 01/13/2014  . Hypertension 01/13/2014  . Former smoker 01/13/2014  . h/o Prostate cancer 01/13/2014  . History of tuberculosis 01/13/2014  . Bipolar disorder ([place]) 01/13/2014  . Explosive personality disorder ([place]) 01/13/2014  . Erectile dysfunction 01/13/2014  . Nephrolithiasis 01/13/2014  . Chronic pain 01/13/2014  . Alcoholism ([place]) 01/13/2014  . Obstructive sleep apnea 01/13/2014    History obtained from: chart review and patient.    [name]'s Primary Care Provider is Levin Bacon, NP.     [name] is a 67 y.o. male presenting for a follow up visit. He has a history of severe persistent asthma with COPD, and he did improve with albuterol nebulizer.  We did continue him on prednisone 10 mg, but we discussed adding a biologic to help wean him from the prednisone.  We attempted to get him approved for Nucala however, he has not returned some paperwork to [facility].  We continued him on Brovana 1 treatment twice daily, Pulmicort 0.25 mg twice daily, and his prednisone.  He has a  history of perennial and seasonal allergic rhinitis.  We continued Claritin and Flonase.  Since the last visit, he has mostly done well. However last week he missed an appointment with me so we followed up with him over the phone. He was having problems with breathing and chest pain and ended up going to the hospital. He was seen in the ED on October 11th and a stress test was recommended. However it would not happen until Monday. So he declined and left AMA. He has since called Cardiology to make an appointment, but it turns out that they were not in his network. He is calling around to see who is on the [employer].   Asthma/Respiratory Symptom History: He remains in all of his nebulizer medications including Brovana twice a day and  Pulmicort twice a day.  He continues to use his albuterol 2-3 times per day.  He has not gotten in to see pulmonology yet.  He is planning to see Dr. Camillo Flaming at [facility], but he needs his records sent over for them to review before they schedule it.  He does not have any tax information to get his Nucala co-pay assistance approved.  He tells me that he stopped filing taxes when the government tried to text his disability income.  He is also planning to get a different insurance plan for 20/20 since he does not have great medication coverage with his supplemental plan.  Allergic Rhinitis Symptom History: He remains on Claritin and Flonase with good results.  He has had no antibiotic use since the last visit.  Otherwise, there have been no changes to his past medical history, surgical history, family history, or social history.    Review of Systems: a 14-point review of systems is pertinent for what is mentioned in HPI.  Otherwise, all other systems were negative.  Constitutional: negative other than that listed in the HPI Eyes: negative other than that listed in the HPI Ears, nose, mouth, throat, and face: negative other than that listed in the HPI Respiratory: negative other than that listed in the HPI Cardiovascular: negative other than that listed in the HPI Gastrointestinal: negative other than that listed in the HPI Genitourinary: negative other than that listed in the HPI Integument: negative other than that listed in the HPI Hematologic: negative other than that listed in the HPI Musculoskeletal: negative other than that listed in the HPI Neurological: negative other than that listed in the HPI Allergy/Immunologic: negative other than that listed in the HPI    Objective:   Blood pressure 118/70, pulse 74, temperature 97.6 F (36.4 C), temperature source Oral, resp. rate 20, height 5' 11.5" (1.816 m), weight 231 lb (104.8 kg), SpO2 96 %. Body mass index is 31.77  kg/m.   Physical Exam:  General: Alert, interactive, in no acute distress. Pleasant male. Boisterous. Eyes: No conjunctival injection bilaterally, no discharge on the left and no Horner-Trantas dots present. PERRL bilaterally. EOMI without pain. No photophobia.  Ears: Right TM pearly gray with normal light reflex, Left TM pearly gray with normal light reflex, Right TM intact without perforation and Left TM intact without perforation.  Nose/Throat: External nose within normal limits and septum midline. Turbinates edematous and pale with clear discharge. Posterior oropharynx erythematous without cobblestoning in the posterior oropharynx. Tonsils 2+ without exudates.  Tongue without thrush. Lungs: Decreased breath sounds with expiratory wheezing bilaterally. No increased work of breathing. CV: Normal S1/S2. No murmurs. Capillary refill <2 seconds.  Skin: Warm and dry, without  lesions or rashes. Neuro:   Grossly intact. No focal deficits appreciated. Responsive to questions.  Diagnostic studies:   Spirometry: results abnormal (FEV1: 1.63/47%, FVC: 2.26/49%, FEV1/FVC: 72%).    Spirometry consistent with normal pattern.   Allergy Studies: none     Allergy testing results were read and interpreted by myself, documented by clinical staff.      [name], MD  [facility] [place]

## 2018-07-29 NOTE — Patient Instructions (Addendum)
1. Severe persistent asthma with COPD overlap - I do not think that more prednisone is needed today. [name] looks about just as terrible as  - Nucala would help tremendously, so please get that information that Tammy needs and return it to her (statement from social security that him and his wife get every year stating what their monthly income from social security will be and have the pharmacy print statement for them both from Jan 2019 to present) - We will work on getting Maretta Bees approved (this one opens your lungs up using a different receptor than Brovana.  - In the meantime, we will start you on Spiriva 2.5 mcg one puff once daily until we can get the [facility] approved.   - Daily controller medication(s): Brovana one treatment twice daily + Pulmicort 0.25mg  one treatment twice daily + prednisone 10mg  once daily - Prior to physical activity: ProAir 2 puffs 10-15 minutes before physical activity. - Rescue medications: albuterol nebulizer one vial every 4-6 hours as needed or DuoNeb nebulizer one vial every 4-6 hours as needed - Asthma control goals:  * Full participation in all desired activities (may need albuterol before activity) * Albuterol use two time or less a week on average (not counting use with activity) * Cough interfering with sleep two time or less a month * Oral steroids no more than once a year * No hospitalizations  2. Perennial and seasonal allergic rhinitis - Continue with loratadine 10mg  daily as needed. - Continue with fluticasone 1-2 sprays per nostril daily as needed.  3. GERD - Continue omeprazole 40mg  daily to control your reflux. - This could be contributing to your night time symptoms.   4. Obstructive sleep apnea - We will send notes to Dr. Cruz Condon office [facility]).  5. Return in about 3 months (around 10/29/2018).   Please inform us of any Emergency Department visits, hospitalizations, or changes in symptoms. Call us before going to the ED for  breathing or allergy symptoms since we might be able to fit you in for a sick visit. Feel free to contact us anytime with any questions, problems, or concerns.  It was a pleasure to see you again today!  Websites that have reliable patient information: 1. American Academy of Asthma, Allergy, and Immunology: www.aaaai.org 2. Food Allergy Research and Education (FARE): foodallergy.org 3. Mothers of Asthmatics: http://www.asthmacommunitynetwork.org 4. American College of Allergy, Asthma, and Immunology: MonthlyElectricBill.co.uk   Make sure you are registered to vote! If you have moved or changed any of your contact information, you will need to get this updated before voting!

## 2018-07-30 ENCOUNTER — Telehealth: Payer: Self-pay

## 2018-07-30 ENCOUNTER — Encounter: Payer: Self-pay | Admitting: Allergy & Immunology

## 2018-07-30 ENCOUNTER — Telehealth: Payer: Self-pay | Admitting: Allergy & Immunology

## 2018-07-30 DIAGNOSIS — J302 Other seasonal allergic rhinitis: Secondary | ICD-10-CM | POA: Insufficient documentation

## 2018-07-30 DIAGNOSIS — J3089 Other allergic rhinitis: Secondary | ICD-10-CM

## 2018-07-30 NOTE — Telephone Encounter (Signed)
Patient went to his doctors office to get a flu shot They told him that he could not get it because he was on a steroid Patient has been on steroids before and gotten the shot Her was referred back to his allergy/asthma doctor to ask about the flu shot  - patient wants to know what he should do  ((( Patient also need to fill out and sign a release for Dr Camillo Flaming to release his records to [facility] - per dr Ernst Bowler )))

## 2018-07-30 NOTE — Telephone Encounter (Signed)
Spiriva inhaler is not covered under patients insurance.  [name] from [facility] called and informed us that the Trelegy and Anora inhalers are formulary alternatives.  Do you want [name] to start a PA for Spiriva or switch inhaler to one that is covered?

## 2018-07-30 NOTE — Telephone Encounter (Signed)
Dr. Gallagher? 

## 2018-07-31 NOTE — Telephone Encounter (Signed)
Bring him into our office for a flu shot. He is never going to be off steroids most likely since he has been on them for 5+ years. We can have him sign a release of information at that time as well.  [name], MD [facility] [place]

## 2018-07-31 NOTE — Telephone Encounter (Signed)
Spoke to patient advised to to go to pharmacy to get flu shot. Advised he is on a continuous steroid so it should not affect shot patient verbalized understanding.

## 2018-07-31 NOTE — Telephone Encounter (Signed)
Prior authorization has been submitted on covermymeds. I will await coverage determination.

## 2018-08-08 NOTE — Telephone Encounter (Signed)
Prior authorization has been approved and sent to the pharmacy.  

## 2018-08-25 ENCOUNTER — Telehealth: Payer: Self-pay | Admitting: *Deleted

## 2018-08-25 NOTE — Telephone Encounter (Signed)
I spoke with [name]. Informed me that he was having a weighted feeling on his chest, sometimes the pain in his chest was sharp. He also reports shortness of breath and that he has had a fever off and on. He informed me that his bp has been approximately 179/90 and his symptoms get worse when this happens. He asked about doubling his blood pressure medication to help with the blood pressure. I told him that under no circumstance is he to do that. I spoke with Jonathon Pina, LPN and Johnette, RN(Practice administrator) who both agreed that the best plan of care would be to send patient to the ED given medication history and symptoms. Patient informed me that he would go once his wife arrived home around 4 pm. I told him I would be calling around [phone] to follow up and that if he has any worsening symptoms to go on to the ED or contact 911. Patient verbalized understanding and agrees with plan. He will be going to [facility] ED near his home.

## 2018-08-25 NOTE — Telephone Encounter (Signed)
Patient called states over the last 9 days he has been having difficult breathing he is not sure if he can increase his oxygen. States he is taking Brovana, budesonide,albuterol twice a day and using his rescue inhaler through the day when doing activities. States breathing is worse at nighttime Dr Maudie Mercury can you please advise he is a Dr Ernst Bowler patient .

## 2018-08-25 NOTE — Telephone Encounter (Signed)
Please call back patient. He should be seen in an OV either today or tomorrow. Not sure if anyone has any openings.  If can't get into our office, he needs to go to urgent care or PCP. Is he checking his oxygen saturation (aka pulse ox) at home?  - Daily controller medication(s): Brovana one treatment twice daily + Pulmicort 0.25mg  one treatment twice daily + prednisone 10mg  once daily - Prior to physical activity: ProAir 2 puffs 10-15 minutes before physical activity. - Rescue medications: albuterol nebulizer one vial every 4-6 hours as needed or DuoNeb nebulizer one vial every 4-6 hours as needed

## 2018-08-26 NOTE — Progress Notes (Deleted)
HPI: FU CP.  Nuclear study August 2015 normal.  Abdominal ultrasound May 2018 showed 2.6 cm ectatic abdominal aorta and follow-up recommended 5 years.  Echocardiogram September 2017 showed normal LV systolic function and moderate left atrial enlargement.  Nuclear study October 2017 showed ejection fraction 61% and no ischemia or infarction.  Monitor October 2017 showed sinus with occasional PVC.  Patient seen in the emergency room October 2019 with chest pain.  Troponins normal.  Chest x-ray negative.  Hemoglobin 14.4.  Electrocardiogram showed no ST changes. Admission recommended but apparently patient declined.  Since last seen  Current Outpatient Medications  Medication Sig Dispense Refill  . acetaminophen (TYLENOL) 500 MG tablet Take 500 mg by mouth every 6 (six) hours as needed for headache (pain).    Marland Kitchen albuterol (PROAIR HFA) 108 (90 Base) MCG/ACT inhaler Inhale 2 puffs into the lungs every 4 (four) hours as needed for wheezing or shortness of breath. 1 Inhaler 3  . albuterol (PROVENTIL) (2.5 MG/3ML) 0.083% nebulizer solution Take 2.5 mg by nebulization every 6 (six) hours as needed for wheezing or shortness of breath.    . ALPRAZolam (XANAX) 0.5 MG tablet Take 0.5 mg by mouth daily as needed for anxiety.     Marland Kitchen arformoterol (BROVANA) 15 MCG/2ML NEBU Take 2 mLs (15 mcg total) by nebulization 2 (two) times daily. 120 mL 5  . baclofen (LIORESAL) 10 MG tablet Take 10 mg by mouth daily as needed.     . bisoprolol (ZEBETA) 5 MG tablet Take 5 mg by mouth daily.    . budesonide (PULMICORT) 0.25 MG/2ML nebulizer solution Take 2 mLs (0.25 mg total) by nebulization 2 (two) times daily. 120 mL 5  . dexlansoprazole (DEXILANT) 60 MG capsule Take 1 capsule (60 mg total) by mouth daily. (Patient taking differently: Take 60 mg by mouth every other day. ) 90 capsule 1  . EPINEPHrine 0.3 mg/0.3 mL IJ SOAJ injection Inject 0.3 mLs (0.3 mg total) into the muscle once. (Patient taking differently: Inject 0.3  mg into the muscle once as needed (severe allergic reactions). ) 1 Device 1  . fluticasone (FLONASE) 50 MCG/ACT nasal spray Place 1 spray into both nostrils daily as needed. 16 g 5  . loratadine (CLARITIN) 10 MG tablet Take 1 tablet (10 mg total) by mouth as needed. 30 tablet 5  . Menthol, Topical Analgesic, (BIOFREEZE EX) Apply 1 application topically daily as needed (knee pain). TO THE LEFT KNEE AS NEEDED FOR PAIN     . nitroGLYCERIN (NITROSTAT) 0.4 MG SL tablet Place 1 tablet (0.4 mg total) under the tongue every 5 (five) minutes as needed for chest pain. 30 tablet 3  . ondansetron (ZOFRAN ODT) 8 MG disintegrating tablet Take 1 tablet (8 mg total) by mouth every 8 (eight) hours as needed for nausea or vomiting. 10 tablet 0  . OXYGEN Inhale 2 L into the lungs at bedtime. DURING THE DAY IF NEEDED Reported on 11/25/2015    . predniSONE (DELTASONE) 10 MG tablet Take 1 tablet (10 mg total) by mouth daily. 30 tablet 4  . Tiotropium Bromide Monohydrate (SPIRIVA RESPIMAT) 2.5 MCG/ACT AERS Inhale 1 puff into the lungs 1 day or 1 dose. 1 Inhaler 5  . Vitamin D, Ergocalciferol, (DRISDOL) 50000 units CAPS capsule Take 50,000 Units by mouth every 7 (seven) days.     No current facility-administered medications for this visit.      Past Medical History:  Diagnosis Date  . Anxiety disorder   .  Arthritis    BILATERAL SHOULDERS, ELBOWS AND HANDS AND LEFT HIP AND KNEES--HX CORTISONE SHOTS IN SHOULDERS, ELBOWS, HIP AND KNEES  . Basal cell carcinoma of back 04/30/2016   Dermatologist- Dr Nevada Crane;   MOHS sx- Dr Levada Dy   . Bladder outlet obstruction   . Chronic idiopathic constipation 05/08/2015  . Complication of anesthesia    DIFFICULT WAKING   . COPD (chronic obstructive pulmonary disease) Gold C Frequent exacerbations 01/13/2014   [name] 07/08/14: FeV1 51% FeV1/FVC 66% FVC 59% 10/5/2015ONO RA was normal  10/13/2014  ONO on RA NORMAL   . COPD, frequent exacerbations ([place])    pulmologist-  dr Joya Gaskins--  [place] Rm Stage  C.04-25-15 recent COPD exacerbation-much improved now, after tx. in ER [facility].  Marland Kitchen Depression   . Diabetes mellitus without complication ([place])    BODERLINE - DIET CONTROL  . Dysrhythmia    PVC'S  . Emphysema lung ([place])    stage 2  . Family history of adverse reaction to anesthesia    father would wake up with agitation   . Former smoker 01/13/2014  . Gastroesophageal reflux disease without esophagitis   . Heavy alcohol use 04/30/2016  . History of chronic bronchitis   . History of oxygen administration    oxygen use 2 l/m nasally at bedtime and exertional occasions  . History of rheumatic fever   . History of TB (tuberculosis)    1984--  hospitalized for 4 month treatment  . History of urinary retention   . Hx of multiple concussions    x 2 per patient   . Hypertension   . Melanoma ([place])   . Nocturnal oxygen desaturation    USES O2 NIGHTLY  . OSA (obstructive sleep apnea) 01/13/2014  . PONV (postoperative nausea and vomiting)   . Post-polio syndrome    polio at age 70--PT WAS IN IRON LUNG; PT WAS IN W/C UNTIL AGE 54; STILL HAS WEAKNESS RIGHT SIDE  . Prostate cancer ([place])   . Schizophrenia ([place])   . Tuberculosis    Hosp 4 months rx , left early   . Urticaria     Past Surgical History:  Procedure Laterality Date  . CARDIOVASCULAR STRESS TEST  06-08-2014  dr Mare Ferrari   normal lexiscan study/  no ischemia/  not gated due to PAC's  . COLONOSCOPY N/A 05/03/2015   Procedure: COLONOSCOPY;  Surgeon: [name], MD;  Location: [facility];  Service: Endoscopy;  Laterality: N/A;  . CYSTOSCOPY N/A 10/25/2015   Procedure: CYSTOSCOPY;  Surgeon: [name], MD;  Location: [facility];  Service: Urology;  Laterality: N/A;  . CYSTOSCOPY W/ CYSTOGRAM/  TRANSRECTAL ULTRASOUND PROSTATE BX  03-22-2009  . ESOPHAGOGASTRODUODENOSCOPY N/A 03/22/2015   Procedure: ESOPHAGOGASTRODUODENOSCOPY (EGD) with dilation;  Surgeon: [name], MD;  [facility]: [facility];  Service: Endoscopy;  Laterality: N/A;    . excision of skin lesion    . LAPAROSCOPIC CHOLECYSTECTOMY  2013  . left elbow surgery      due to fracture   . NASAL SEPTUM SURGERY  2000  . OTHER SURGICAL HISTORY      Muscle & bone Graft/Polio  . polio surgeries      14 polio surgeries   . PROSTATE BIOPSY N/A 09/28/2014   Procedure: PROSTATE ULTRASOUND/BIOPSY;  Surgeon: [name], MD;  [facility];  Service: Urology;  Laterality: N/A;  . PROSTATE BIOPSY N/A 10/25/2015   Procedure: PROSTATE BIOPSY AND ULTRASOUND;  Surgeon: [name], MD;  Location: [facility];  Service:  Urology;  Laterality: N/A;  . SAVORY DILATION N/A 03/22/2015   Procedure: SAVORY DILATION;  Surgeon: [name], MD;  [facility];  Service: Endoscopy;  Laterality: N/A;  . SHOULDER ARTHROSCOPY WITH OPEN ROTATOR CUFF REPAIR Bilateral 2013  &  1999   removal spurs and bursectomy  . TRANSURETHRAL INCISION OF BLADDER NECK N/A 10/25/2015   Procedure:  TRANSURETHRAL INCISION OF BLADDER NECK;  Surgeon: [name], MD;  [facility];  Service: Urology;  Laterality: N/A;  . TRANSURETHRAL RESECTION OF PROSTATE N/A 09/28/2014   Procedure: TRANSURETHRAL RESECTION OF THE PROSTATE (TURP);  Surgeon: [name], MD;  Location: [facility];  Service: Urology;  Laterality: N/A;  . URETEROSOPY STONE EXTRACTION  2000    Social History   Socioeconomic History  . Marital status: Married    Spouse name: Not on file  . Number of children: Not on file  . Years of education: Not on file  . Highest education level: Not on file  Occupational History  . Occupation: Retired  Scientific laboratory technician  . Financial resource strain: Not on file  . Food insecurity:    Worry: Not on file    Inability: Not on file  . Transportation needs:    Medical: Not on file    Non-medical: Not on file  Tobacco Use  . Smoking status: Former Smoker    Years: 47.00    Types: Pipe    Last attempt to quit: 08/25/2013    Years since quitting: 5.0  . Smokeless tobacco: Former Systems developer  . Tobacco  comment: 5-6 times smoking a pipe  Substance and Sexual Activity  . Alcohol use: Yes    Alcohol/week: 14.0 standard drinks    Types: 14 Cans of beer per week    Comment: 2 cans daily  . Drug use: No  . Sexual activity: Yes    Birth control/protection: None  Lifestyle  . Physical activity:    Days per week: Not on file    Minutes per session: Not on file  . Stress: Not on file  Relationships  . Social connections:    Talks on phone: Not on file    Gets together: Not on file    Attends religious service: Not on file    Active member of club or organization: Not on file    Attends meetings of clubs or organizations: Not on file    Relationship status: Not on file  . Intimate partner violence:    Fear of current or ex partner: Not on file    Emotionally abused: Not on file    Physically abused: Not on file    Forced sexual activity: Not on file  Other Topics Concern  . Not on file  Social History Narrative  . Not on file    Family History  Problem Relation Age of Onset  . Alzheimer's disease Father 4       Deceased  . Stomach cancer Father   . Heart attack Father   . Heart disease Father   . Skin cancer Mother        Facial-Living  . Cancer Mother        skin  . Alcohol abuse Sister        x2  . Mental illness Sister        x2  . Alcohol abuse Sister   . Diabetes Maternal Aunt        x2  . Thyroid disease Maternal Aunt  x4  . Diabetes Maternal Uncle   . Tuberculosis Paternal Grandfather   . Tuberculosis Paternal Grandmother   . Alzheimer's disease Paternal Aunt   . Alzheimer's disease Paternal Uncle   . Colon cancer Neg Hx   . Colon polyps Neg Hx   . Crohn's disease Neg Hx   . Ulcerative colitis Neg Hx   . Allergic rhinitis Neg Hx   . Angioedema Neg Hx   . Asthma Neg Hx   . Eczema Neg Hx   . Urticaria Neg Hx     ROS: no fevers or chills, productive cough, hemoptysis, dysphasia, odynophagia, melena, hematochezia, dysuria, hematuria, rash, seizure  activity, orthopnea, PND, pedal edema, claudication. Remaining systems are negative.  Physical Exam: Well-developed well-nourished in no acute distress.  Skin is warm and dry.  HEENT is normal.  Neck is supple.  Chest is clear to auscultation with normal expansion.  Cardiovascular exam is regular rate and rhythm.  Abdominal exam nontender or distended. No masses palpated. Extremities show no edema. neuro grossly intact  ECG- personally reviewed  A/P  1 chest pain-patient has had difficulties with recurrent chest pain previously.  Most recent evaluation as outlined in HPI.  Enzymes and electrocardiogram were unremarkable.  2 hypertension-patient's blood pressure is controlled today.  Continue present medications and follow.  3 hyperlipidemia-continue statin.  Kirk Ruths, MD

## 2018-08-26 NOTE — Telephone Encounter (Signed)
I contacted patient this morning to follow up on the ED visit. Patient informed me that he did not go because he and his wife discussed it and she told him that all he needed was some Zantac. I informed him that I would have to put in his chart that he did not go to ED despite medical advisement to do so.

## 2018-08-27 ENCOUNTER — Ambulatory Visit: Payer: Medicare HMO | Admitting: Cardiology

## 2018-08-28 NOTE — Telephone Encounter (Signed)
Called patient left message to return call need to ask him how he is doing per Dr Ernst Bowler

## 2018-08-28 NOTE — Telephone Encounter (Signed)
Can someone call Mr. Vora to see how he is doing? I put him on Spiriva last time, but if he does not like that we can change him Yupelri (nebulized form of the medication). He is supposed to be coming back to see me in January, but there is no appointment scheduled. I am not going to continue his prednisone unless he keeps follow up appointments.  [name], MD Allergy and [place] [facility] [place]

## 2018-09-01 ENCOUNTER — Telehealth: Payer: Self-pay | Admitting: Allergy & Immunology

## 2018-09-01 NOTE — Telephone Encounter (Signed)
Pt called and was upset about a phone number that we give him it did not work and it was not to a pulmonology that is were he wants to go. [phone]

## 2018-09-01 NOTE — Telephone Encounter (Signed)
Pt called wanting to know the place Dr. Ernst Bowler referred him to. I gave him [facility] name and number for Dr. Camillo Flaming office. (For Sleep Apnea).

## 2018-09-02 ENCOUNTER — Telehealth: Payer: Self-pay | Admitting: Allergy

## 2018-09-02 NOTE — Telephone Encounter (Signed)
Called patient and patient stated that they feel about the same on Spiriva without improvement. Advised patient about switching to [facility] and patient was interested. He wonders how often he will be taking the medication and if he will need to discontinue any of the other nebulizer medications? Please advise.

## 2018-09-02 NOTE — Telephone Encounter (Signed)
Patient called said he had gotten a letter from [employer]. He talked with them last Thursday  and they said the grant was covered for another 90 days.Patient said he needed his oxygen Told him to call is PCP to see who prescribed it for him are ether call where he got it.  Gets his asthma meditation from  [employer]. [place].

## 2018-09-04 NOTE — Telephone Encounter (Signed)
[name] is a nebulized form of [name] essentially. I know the rest of [name] medications are nebulized, so I figured he would like this one. He would get one treatment once daily. We have some samples at [facility] I believe. I will leave some at the front desk for him to try. Have him call us after he has finished the sample pack (contains seven vials) to let her know what he thinks.   Jonathon Marvel, MD Allergy and [place] [facility] [place]

## 2018-09-04 NOTE — Telephone Encounter (Signed)
I left a detailed message for patient advising him of this. Sample has been placed up front. I will take care of ordering the medication if the patient is acceptable with the treatment.

## 2018-09-16 ENCOUNTER — Other Ambulatory Visit: Payer: Self-pay | Admitting: Cardiology

## 2018-09-16 ENCOUNTER — Ambulatory Visit: Payer: Medicare HMO | Admitting: Cardiology

## 2018-09-16 NOTE — Telephone Encounter (Signed)
New Message           *STAT* If patient is at the pharmacy, call can be transferred to refill team.   1. Which medications need to be refilled? (please list name of each medication and dose if known) Nitro  2. Which pharmacy/location (including street and city if local pharmacy) is medication to be sent to? [place]  3. Do they need a 30 day or 90 day supply? [place]

## 2018-09-16 NOTE — Telephone Encounter (Signed)
NTG refill refused. Per chart review, last filled 2016 by PCP office. Patient has no known CAD per notes.

## 2018-09-18 ENCOUNTER — Other Ambulatory Visit: Payer: Self-pay

## 2018-09-18 ENCOUNTER — Telehealth: Payer: Self-pay | Admitting: *Deleted

## 2018-09-18 DIAGNOSIS — J4550 Severe persistent asthma, uncomplicated: Secondary | ICD-10-CM

## 2018-09-18 MED ORDER — YUPELRI 175 MCG/3ML IN SOLN: 1.0000 | Freq: Every day | RESPIRATORY_TRACT | 5 refills | Status: DC

## 2018-09-18 NOTE — Telephone Encounter (Signed)
I can put it in if you tell me the strength and how to take

## 2018-09-18 NOTE — Telephone Encounter (Signed)
Dr. Gallagher? 

## 2018-09-18 NOTE — Telephone Encounter (Signed)
We can send in [place] for him. Lonn Georgia can you do that?   [name], MD Allergy and [place] [facility] [place]

## 2018-09-18 NOTE — Telephone Encounter (Signed)
Prescription sent via escribing to [facility]. I am also having the form signed and faxed along with all office notes, insurance information and allergy information.

## 2018-09-18 NOTE — Telephone Encounter (Signed)
Patient is calling about getting YUPELRI He is now out of the medication and wants to know what he needs to do

## 2018-09-18 NOTE — Telephone Encounter (Signed)
I will submit the prescription and then go from there. In the meantime I will have the patient come and pick up samples.

## 2018-09-18 NOTE — Telephone Encounter (Signed)
Samples in [place] and will be picked up by patient today or tomorrow. I am sending the prescription in to [facility] as instructed by representative for this medication to help with the cost.

## 2018-09-18 NOTE — Telephone Encounter (Signed)
Patient states he has had congestion, and he is going to the emergency room to be treated, patient states he just wanted to let Dr Ernst Bowler know, patient states everytime this happens he gets pneumonia

## 2018-09-18 NOTE — Addendum Note (Signed)
Addended by: Lucrezia Starch I on: 09/18/2018 06:17 PM   Modules accepted: Orders

## 2018-09-19 NOTE — Telephone Encounter (Signed)
Noted. Thanks for taking care of that!   [name], MD Allergy and [place] [facility] [place]

## 2018-09-22 ENCOUNTER — Telehealth: Payer: Self-pay | Admitting: Allergy & Immunology

## 2018-09-22 NOTE — Telephone Encounter (Signed)
Samples were not available in [place]. I tried to call patient on Friday but the call went to voicemail. [facility] should have reached out by now to him.

## 2018-09-22 NOTE — Telephone Encounter (Signed)
Left message requesting a call back to discuss

## 2018-09-22 NOTE — Telephone Encounter (Signed)
[name] called today having issues with his throat. He said his throat is really sore and raw. He thought he had strep but now seems to think it is from his neb. He stated he is using his neb 8-9x's a day. His throat is so sore he can barely swallow.  Communication was really difficult with [name] due to his cell phone or he had me on speaker phone.  Please advise.

## 2018-09-22 NOTE — Telephone Encounter (Signed)
Pt says he can not afford the nucala as he does not have a supplement, he does not file taxes because he is on disability.   He also uses a wood burning stove for head.   He is doing budesonide and brovana twice a day and albuterol neb at lunch time and middle of the night.

## 2018-09-22 NOTE — Telephone Encounter (Signed)
The problem is that he needs to be on Nucala but continues to refuse to return the forms. Please have him come in for further discussion. I have tried to change him to something other than nebulized medications for his asthma, but he is rather stubborn.   Jonathon Marvel, MD Allergy and [place] [facility] [place]

## 2018-09-22 NOTE — Telephone Encounter (Signed)
Where does the Yupelri come in? Also did he restart his CPAP? That might with the middle of the night awakenings. If he is on disability, Nucala has to be covered.   [name], MD Allergy and [place] [facility] [place]

## 2018-09-23 ENCOUNTER — Encounter: Payer: Self-pay | Admitting: Cardiology

## 2018-09-23 DIAGNOSIS — I471 Supraventricular tachycardia: Secondary | ICD-10-CM | POA: Insufficient documentation

## 2018-09-23 NOTE — Telephone Encounter (Signed)
yupelri I believed was in the afternoons. Maybe you can call and speak to him further Dr Darnell Level about the affordability of nucala. He did not mention the cpap to me.

## 2018-09-23 NOTE — Telephone Encounter (Signed)
I have already tried to discuss affordability for Nucala with patient but he will not send me his paperwork although Dr Ernst Bowler has told him numerous times to send it to me or contact me.

## 2018-09-23 NOTE — Telephone Encounter (Signed)
[name] is taking care of the Yupelri.   [name], MD Allergy and [place] [facility] [place]

## 2018-09-23 NOTE — Telephone Encounter (Signed)
Patient has not received any calls. I advised him that it may be a few days. I requested he call back if still no news by Thursday.

## 2018-09-24 ENCOUNTER — Ambulatory Visit: Payer: Medicare HMO | Admitting: Cardiology

## 2018-09-25 NOTE — Telephone Encounter (Signed)
I have faxed the prior authorization to the numbers on forms. I will follow up on Monday to give sufficient time for approval/denial process.

## 2018-09-26 NOTE — Telephone Encounter (Signed)
Please advise 

## 2018-09-26 NOTE — Telephone Encounter (Signed)
Pt states he cannot afford medicated Maretta Bees' because he no longer has a [employer] Looking for samples or a lower cost alternative.

## 2018-09-26 NOTE — Telephone Encounter (Signed)
We are working on getting approval for this. It can be submitted through his regular Medicare I believe. Lonn Georgia is working on this.   In the meantime, continue to provide samples. We should have some in [facility].   [name], MD Allergy and [place] [facility] [place]

## 2018-09-29 ENCOUNTER — Telehealth: Payer: Self-pay | Admitting: Allergy & Immunology

## 2018-09-29 ENCOUNTER — Ambulatory Visit: Payer: Medicare HMO | Admitting: Cardiology

## 2018-09-29 DIAGNOSIS — J4550 Severe persistent asthma, uncomplicated: Secondary | ICD-10-CM

## 2018-09-29 MED ORDER — YUPELRI 175 MCG/3ML IN SOLN: 1.0000 | Freq: Every day | RESPIRATORY_TRACT | 5 refills | Status: DC

## 2018-09-29 NOTE — Telephone Encounter (Signed)
[facility]. [facility] [address] [facility] [place].

## 2018-09-29 NOTE — Telephone Encounter (Signed)
Left message

## 2018-09-29 NOTE — Telephone Encounter (Signed)
Will inform pt of the samples avaliable for pick up. We only have 2 samples and then we arr out there is 7 vials in each bo so only a 14 day supply

## 2018-09-29 NOTE — Telephone Encounter (Signed)
Ok so does he want to stop the [place]? If so, I will just stop working on this paperwork for it.   I referred him to see Dr. Camillo Flaming in September 2019 and the referral says "complete". I tracked the referral and it says that he had an appointment made on October 7th with [facility]. When I check the encounters, it says "no show". If we are going to continue to try to make referrals for him, he needs to keep the appointments.    [name], MD Allergy and [place] [facility] [place]

## 2018-09-29 NOTE — Telephone Encounter (Signed)
[facility], [facility]   Pt stated he has had a very bad burning sensation in his throat, sore throat, trouble swallowing x 2 weeks since using the yupelri medication. He does rinse, gargle and spit afterwards. He also states he has been coughing up yellow/brown phlegm x 1 and a half months that is the worst in the mornings- using mucinex every day.  Pt also asking the status of the [facility]ology referral.   Please advise.

## 2018-09-30 NOTE — Telephone Encounter (Signed)
Pt called back he wants to continue the yupeleri because he said it has really been helping. Gave pt address and phone number to pulmonology [address] he will call to reschedule his missed apt.

## 2018-10-02 MED ORDER — YUPELRI 175 MCG/3ML IN SOLN: 1.0000 | Freq: Every day | RESPIRATORY_TRACT | 2 refills | Status: DC

## 2018-10-02 MED ORDER — TIOTROPIUM BROMIDE MONOHYDRATE 2.5 MCG/ACT IN AERS: 1.0000 | INHALATION_SPRAY | RESPIRATORY_TRACT | 5 refills | Status: DC

## 2018-10-02 NOTE — Telephone Encounter (Signed)
Prescription sent in for the Spiriva. Patient informed and advised of the prescription change.

## 2018-10-02 NOTE — Telephone Encounter (Signed)
I spoke with rep from [employer]. She informed me that the Maretta Bees has been approved with the patient's Medicare Part B. She did tell me that the co-pay would be 600 dollars. [name] is aware of this. I called [facility] back and informed them of this. They are aware and need to speak with the patient to see if he will qualify for any cost share/save programs.   I called the patient and gave him the contact information for Sanja with [place]. I also gave him the number for our biologic coordinator so that he can get this set up.   Patient called [facility] to discuss the cost sharing program. Patient informed me that [facility] had not heard of anything. I told patient that I would call Sanja back and discuss this. I called [facility] back and they told me [facility]   [facility] informed me that [facility] told [facility] was $35 but they would have to order it. Then the price jumped to $600+. The patient is unable to pay that and I advised the pharmacist, Cristie Hem, to cancel the prescription so that I could see if I could get it cheaper elsewhere. The prescription has been cancelled. I am letting the patient know to call [place] [facility] back in about 30-45 minutes and see what they say about the cost share program now that they are able to get the prescription filled with them.   Prescription for Yupelri sent to [facility] to be filled. Patient is aware.

## 2018-10-02 NOTE — Telephone Encounter (Signed)
Let's just change to Spiriva 2.5 mcg one puff once daily.  [name], MD Allergy and [place] [facility] [place]

## 2018-10-02 NOTE — Addendum Note (Signed)
Addended by: Lucrezia Starch I on: 10/02/2018 03:22 PM   Modules accepted: Orders

## 2018-10-02 NOTE — Addendum Note (Signed)
Addended by: Lucrezia Starch I on: 10/02/2018 04:56 PM   Modules accepted: Orders

## 2018-10-02 NOTE — Telephone Encounter (Signed)
Pateint called back again. He asked me to hold on and then told me that he was on the phone with [facility]. He informed me that they would be delivering the [facility] 10-17-2018. I told him that if he needed samples to get him thru until then to let me know. He then informed me that he would need samples for this med for life because he would not be able to afford it. I tried to ask what was going on with the cost sharing but patient hung up on me.

## 2018-10-03 ENCOUNTER — Telehealth: Payer: Self-pay | Admitting: *Deleted

## 2018-10-03 MED ORDER — ARFORMOTEROL TARTRATE 15 MCG/2ML IN NEBU: 15.0000 ug | INHALATION_SOLUTION | Freq: Two times a day (BID) | RESPIRATORY_TRACT | 5 refills | Status: DC

## 2018-10-03 NOTE — Telephone Encounter (Signed)
Patient wanted to make sure that I had sent in the spiriva. He also wanted a refill of Brovana because he only has 17 days left. Both have been sent and patient is aware.

## 2018-10-03 NOTE — Telephone Encounter (Signed)
Patient called to speak with Lonn Georgia, patient asked for kayla to call him back because she knows what is going on.

## 2018-10-14 ENCOUNTER — Telehealth: Payer: Self-pay | Admitting: *Deleted

## 2018-10-14 NOTE — Telephone Encounter (Signed)
Patient called requesting to talk [name], per patient [name] knows all about his prescription he is having trouble wit Please call   [phone]

## 2018-10-16 ENCOUNTER — Telehealth: Payer: Self-pay | Admitting: *Deleted

## 2018-10-16 NOTE — Telephone Encounter (Signed)
Patient is having an issues getting the Portugal. He is almost out and did not know that it had already been refilled. He then told me that the pharmacy was awaiting insurance approval. I explained that he would need to contact the pharmacy and make sure that they are running that medication throught his Medicare Part B and not the Part D. He acknowledged understanding of instructions and will call back with any further questions or concerns.

## 2018-10-16 NOTE — Telephone Encounter (Signed)
Patient called states he needs a referral for [facility] due to them being [facility] now and that's the new protocol. Please Karena Addison thank you

## 2018-10-17 NOTE — Telephone Encounter (Signed)
I am a little confused on this one. We have never referred the patient to that office. He was seeing [facility] doctor but no showed his visit in October of 2019. Is he referring to his insurance needing a authorization or referral if so he has to get that from his PCP?  Thanks

## 2018-10-20 ENCOUNTER — Other Ambulatory Visit: Payer: Self-pay | Admitting: *Deleted

## 2018-10-20 MED ORDER — ARFORMOTEROL TARTRATE 15 MCG/2ML IN NEBU: 15.0000 ug | INHALATION_SOLUTION | Freq: Two times a day (BID) | RESPIRATORY_TRACT | 5 refills | Status: DC

## 2018-10-20 MED ORDER — YUPELRI 175 MCG/3ML IN SOLN: 1.0000 | Freq: Every day | RESPIRATORY_TRACT | 2 refills | Status: DC

## 2018-10-20 NOTE — Telephone Encounter (Signed)
LVM  For patient to give me a call.

## 2018-10-21 ENCOUNTER — Telehealth: Payer: Self-pay | Admitting: *Deleted

## 2018-10-21 NOTE — Telephone Encounter (Signed)
Kim calling from [employer] at [employer], per [place] patient told her he needs a Engineer, agricultural on brovana. Maudie Mercury states she will need an authorization. Maudie Mercury states she will fax this authorization over.

## 2018-10-22 NOTE — Telephone Encounter (Signed)
Placed form in Dr. Ernst Bowler office for him to complete.

## 2018-10-23 ENCOUNTER — Telehealth: Payer: Self-pay | Admitting: *Deleted

## 2018-10-23 NOTE — Telephone Encounter (Signed)
Form signed and faxed to [facility]. Will await decision.

## 2018-10-23 NOTE — Telephone Encounter (Signed)
Patient called office and wants to know what medications he should take and what would be the cheapest options for him.  Patient states he can't afford $400 a month for his medications.   Spoke with Dr. Ernst Bowler and patient needs office visit and will discuss best option for him at visit.  Office visit appointment made for patient on 10/30/2018 at 4:00 pm in [facility] per Dr. Ernst Bowler. Called patient back and left message to return call to office to inform patient of office visit on 10/30/2018 at 4:00 pm in [facility].

## 2018-10-23 NOTE — Telephone Encounter (Signed)
Thanks for taking care of that, [facility]!   [name], MD Allergy and [place] [facility] [place]

## 2018-10-24 NOTE — Telephone Encounter (Signed)
[name] has been approved.

## 2018-10-30 ENCOUNTER — Ambulatory Visit: Payer: Medicare HMO | Admitting: Allergy & Immunology

## 2018-10-30 ENCOUNTER — Encounter: Payer: Self-pay | Admitting: Allergy & Immunology

## 2018-10-30 VITALS — BP 146/70 | HR 76 | Temp 98.7°F | Resp 20 | Ht 70.5 in | Wt 229.0 lb

## 2018-10-30 DIAGNOSIS — J302 Other seasonal allergic rhinitis: Secondary | ICD-10-CM | POA: Diagnosis not present

## 2018-10-30 DIAGNOSIS — J3089 Other allergic rhinitis: Secondary | ICD-10-CM

## 2018-10-30 DIAGNOSIS — J449 Chronic obstructive pulmonary disease, unspecified: Secondary | ICD-10-CM

## 2018-10-30 DIAGNOSIS — K219 Gastro-esophageal reflux disease without esophagitis: Secondary | ICD-10-CM | POA: Diagnosis not present

## 2018-10-30 DIAGNOSIS — J4489 Other specified chronic obstructive pulmonary disease: Secondary | ICD-10-CM

## 2018-10-30 NOTE — Progress Notes (Signed)
FOLLOW UP  Date of Service/Encounter:  10/30/18   Assessment:   Severe persistent asthmawith COPD overlap  Chronic prednisone use (5+ years) - currently on 10mg  daily (with intermittent self-induced bursts up to 20mg  daily)  Seasonal and perennial allergic rhinitis(grasses, weeds, trees, cat, dog, dust mite, and cockroach)  History of non-compliance  Pulmonary nodules (stable per last chest CT June 2019)  Complex medical history, including bipolar depression  GERD - not on a controller medication at this time  OSA - with history of non-compliance with CPAP   Mr. Trudell presents to discuss his medication regimen.  He has been on the same nebulized regimen for several years now, but apparently it has suddenly become too expensive.  We are going to change him from Portugal and Perforomist to Symbicort instead.  He has not felt that the Spiriva provided any kind of relief, so we will get rid of this in the middle of the day.  He has been on Yupelri with good results, but despite our best efforts this was never approved by Medicare.  We also changing him from albuterol to DuoNeb.  He has fairly severe disease, so I did recommend that he use DuoNeb regularly at least twice daily with an optional treatment in the middle of the day.  He also needs to begin mepolizumab, and he has finally filled out the paperwork to get this approved.  I talked to [facility] and she has already submitted this.  I am optimistic that this will be approved and we can get him started on this as a steroid sparing agent.  We are also dividing his prednisone to 5 mg twice daily instead of 10 mg once a day to see if this will provide a more steady state of anti-inflammatory activity to help with his breathing.  He desperately needs a pulmonologist and will refer him to the [facility].  He has fired approximately 2 or 3 pulmonologist in the past, but is open to going to [facility] for another  evaluation.  Plan/Recommendations:   1. Severe persistent asthma with COPD overlap - Lung function was surprisingly stable today. - We are going to try to change things around to see if it might control your symptoms better and be more economical. - Symbicort will replace the Brovana and the Budesonide. - Tammy has submitted the application for Nucala, so we will keep you in the loop.  - Stop the Spiriva since you do not think that this is working at all.  - We will refer you again to Pulmonology, but we will instead refer you to the [facility] group (Dr. Rolla Etienne or Dr. Lake Bells).  - Daily controller medication(s): Symbicort 160/4.64mcg two puffs twice daily with spacer + prednisone 5mg  twice daily + DuoNeb one treatment twice daily - Prior to physical activity: ProAir 2 puffs 10-15 minutes before physical activity. - Rescue medications: albuterol nebulizer one vial every 4-6 hours as needed or DuoNeb nebulizer one vial every 4-6 hours as needed - Asthma control goals:  * Full participation in all desired activities (may need albuterol before activity) * Albuterol use two time or less a week on average (not counting use with activity) * Cough interfering with sleep two time or less a month * Oral steroids no more than once a year * No hospitalizations  2. Perennial and seasonal allergic rhinitis - Continue with loratadine 10mg  daily as needed. - Continue with fluticasone 1-2 sprays per nostril daily as needed.   3. GERD -  Continue omeprazole 40mg  daily to control your reflux.   4. Obstructive sleep apnea - Your Pulmonologist will be able to help you manage the sleep apnea. - There are now some CPAP machines that only go into the nostrils instead of using the mask.   5. Return in about 3 months (around 01/29/2019). Call us in two weeks with an update on how you are feeling with these medication changes.   Subjective:   [name] is a 68 y.o. male presenting today for follow up of   Chief Complaint  Patient presents with  . Asthma    dyspnea and SOB  . Wheezing    alot of phlem in mornings.  . Medication Management    ERHARD SENSKE has a history of the following: Patient Active Problem List   Diagnosis Date Noted  . PSVT (paroxysmal supraventricular tachycardia) ([place]) 09/23/2018  . Seasonal and perennial allergic rhinitis 07/30/2018  . COPD with asthma ([place]) 07/07/2018  . S/P arthroscopy of right shoulder 04/25/2017  . Generalized anxiety disorder 04/22/2017  . Severe persistent asthma without complication 13/24/4010  . Bee sting-induced anaphylaxis 02/12/2017  . Anaphylactic reaction due to food, initial encounter 02/12/2017  . Current use of beta blocker 02/12/2017  . Allergic rhinitis 01/15/2017  . COPD exacerbation ([place]) 11/04/2016  . Asthma exacerbation 11/03/2016  . Nausea without vomiting 09/17/2016  . Shortness of breath   . Diabetes mellitus type 2 in obese ([place]) 06/26/2016  . non-specific Chest pain 06/12/2016  . Patient's noncompliance with other medical treatment and regimen 06/02/2016  . Hyperlipidemia, mixed 06/02/2016  . Elevated liver enzymes  06/02/2016  . Schizophrenia ([place]) 04/30/2016  . Environmental and seasonal allergies 04/30/2016  . Diabetes mellitus without complication- (diet controlled; w/o proteinuria) 04/30/2016  . Heavy alcohol use- Many yrs  04/30/2016  . Basal cell carcinoma of back 04/30/2016  . Obesity 04/30/2016  . Hx of multiple concussions 04/30/2016  . h/o Vitamin D deficiency 04/30/2016  . Screen for sexually transmitted diseases 04/30/2016  . Immunocompromised state ([place]) 04/30/2016  . Cervical radiculopathy 01/15/2016  . Alcohol withdrawal ([place]) 11/22/2015  . Alcohol abuse with alcohol-induced mood disorder ([place]) 11/22/2015  . h/o Suicidal thoughts 11/21/2015  . Dry mouth 10/07/2015  . Anxiety 09/19/2015  . OAB (overactive bladder) 07/27/2015  . Chronic idiopathic constipation 05/08/2015  . Screening  for colon cancer   . Benign neoplasm of cecum   . Benign neoplasm of ascending colon   . Benign neoplasm of transverse colon   . Benign neoplasm of sigmoid colon   . Rectal polyp   . Gastroesophageal reflux disease without esophagitis   . Esophageal stricture   . Joint stiffness of multiple sites 10/17/2014  . Benign localized hyperplasia of prostate with urinary obstruction 09/29/2014  . concerns for memory loss 09/13/2014  . Tuberculosis   . Diverticulum of bladder 06/17/2014  . Acute urinary retention 05/31/2014  . Neuropathy ([place]) 04/11/2014  . Post-polio syndrome 01/13/2014  . Other emphysema ([place]) 01/13/2014  . Hypertension 01/13/2014  . Former smoker 01/13/2014  . h/o Prostate cancer 01/13/2014  . History of tuberculosis 01/13/2014  . Bipolar disorder ([place]) 01/13/2014  . Explosive personality disorder ([place]) 01/13/2014  . Erectile dysfunction 01/13/2014  . Nephrolithiasis 01/13/2014  . Chronic pain 01/13/2014  . Obstructive sleep apnea 01/13/2014  . Alcoholic gastritis 27/25/3664  . Nocturnal hypoxia 08/10/2013    History obtained from: chart review and patient is well as his wife, who in contrast to the patient  is an absolute delight.  Rande Brunt Amberg's Primary Care Provider is Levin Bacon, NP.     Ananias is a 68 y.o. male presenting for a follow up visit.  He was last seen in October 2019.  At that time, we again discussed the initiation of mepolizumab.  We did work on getting Yupelri approved but this was unsuccessful.  We did start Spiriva in the meantime.  We continued Brovana 1 treatment twice daily and Pulmicort 1 treatment twice daily along with his prednisone 10 mg daily.  For his allergic rhinitis, we continue Claritin as well as fluticasone.  We also continued omeprazole 40 mg daily and referred him to see Dr. Camillo Flaming.   Since the last visit, we have had multiple phone calls from him regarding the price of his medications.  Because of all the back-and-forth phone  calls, we decided just to bring him in to discuss his symptoms further.  Asthma/Respiratory Symptom History: He remains on his medications, but he has required 2 steroid injections from his primary care doctor since the last visit.  He also has his prednisone at home, which he occasionally will increase when he is not feeling well.  He does not go into a lot of details, but tells me that he is having a problems affording his medications.  He is interesting in trying new regimens that might be more economical.  He has filled out the paperwork for mepolizumab approval.  He has received the center in the past but apparently had an anaphylactic reaction to it.  He did try to go see Dr. Camillo Flaming, but they said that they had none of his records.  This is despite the fact that they also use epic and could look this up in [place].  In any case, he would like another referral to a different group since that office did not seem to "have their act together".   Allergic Rhinitis Symptom History: He remains on his antihistamine as well as fluticasone as needed.  He is not interested in allergen immunotherapy at this time.  He denies any recent antibiotic use.  He remains on omeprazole 40 mg daily for his reflux. He did see Cardiology, who apparently cleared him (at least according to the patient). He was having chest pain episodes at the last visit, unresponsive to albuterol. Otherwise, there have been no changes to his past medical history, surgical history, family history, or social history.    Review of Systems: a 14-point review of systems is pertinent for what is mentioned in HPI.  Otherwise, all other systems were negative.  Constitutional: negative other than that listed in the HPI Eyes: negative other than that listed in the HPI Ears, nose, mouth, throat, and face: negative other than that listed in the HPI Respiratory: negative other than that listed in the HPI Cardiovascular: negative other than  that listed in the HPI Gastrointestinal: negative other than that listed in the HPI Genitourinary: negative other than that listed in the HPI Integument: negative other than that listed in the HPI Hematologic: negative other than that listed in the HPI Musculoskeletal: negative other than that listed in the HPI Neurological: negative other than that listed in the HPI Allergy/Immunologic: negative other than that listed in the HPI    Objective:   Blood pressure (!) 146/70, pulse 76, temperature 98.7 F (37.1 C), temperature source Oral, resp. rate 20, height 5' 10.5" (1.791 m), weight 229 lb (103.9 kg), SpO2 96 %. Body mass index is 32.39  kg/m.   Physical Exam:  General: Alert, interactive, in no acute distress. Fairly pleasant today. Loud.  Eyes: No conjunctival injection bilaterally, no discharge on the right, no discharge on the left and no Horner-Trantas dots present. PERRL bilaterally. EOMI without pain. No photophobia.  Ears: Right TM pearly gray with normal light reflex, Left TM pearly gray with normal light reflex, Right TM intact without perforation and Left TM intact without perforation.  Nose/Throat: External nose within normal limits and septum midline. Turbinates edematous with clear discharge. Posterior oropharynx erythematous without cobblestoning in the posterior oropharynx. Tonsils 2+ without exudates.  Tongue without thrush. Lungs: Clear to auscultation without wheezing, rhonchi or rales. No increased work of breathing. CV: Normal S1/S2. No murmurs. Capillary refill <2 seconds.  Skin: Warm and dry, without lesions or rashes. Neuro:   Grossly intact. No focal deficits appreciated. Responsive to questions.  Diagnostic studies:   Spirometry: results abnormal (FEV1: 1.41/41%, FVC: 2.30/49%, FEV1/FVC: 61%).    Spirometry consistent with mixed obstructive and restrictive disease. Overall values are stable compared to previous examinations.   Allergy Studies:  none      [name], MD  All[facility] and [place] [facility] [place]

## 2018-10-30 NOTE — Patient Instructions (Addendum)
1. Severe persistent asthma with COPD overlap - Lung function was surprisingly stable today. - We are going to try to change things around to see if it might control your symptoms better and be more economical. - Symbicort will replace the Brovana and the Budesonide. - Tammy has submitted the application for Nucala, so we will keep you in the loop.  - Stop the Spiriva since you do not think that this is working at all.  - We will refer you again to Pulmonology, but we will instead refer you to the [facility] group (Dr. Rolla Etienne or Dr. Lake Bells).  - Daily controller medication(s): Symbicort 160/4.47mcg two puffs twice daily with spacer + prednisone 5mg  twice daily + DuoNeb one treatment twice daily - Prior to physical activity: ProAir 2 puffs 10-15 minutes before physical activity. - Rescue medications: albuterol nebulizer one vial every 4-6 hours as needed or DuoNeb nebulizer one vial every 4-6 hours as needed - Asthma control goals:  * Full participation in all desired activities (may need albuterol before activity) * Albuterol use two time or less a week on average (not counting use with activity) * Cough interfering with sleep two time or less a month * Oral steroids no more than once a year * No hospitalizations  2. Perennial and seasonal allergic rhinitis - Continue with loratadine 10mg  daily as needed. - Continue with fluticasone 1-2 sprays per nostril daily as needed.   3. GERD - Continue omeprazole 40mg  daily to control your reflux.   4. Obstructive sleep apnea - Your Pulmonologist will be able to help you manage the sleep apnea. - There are now some CPAP machines that only go into the nostrils instead of using the mask.   5. Return in about 3 months (around 01/29/2019). Call us in two weeks with an update on how you are feeling with these medication changes.    Please inform us of any Emergency Department visits, hospitalizations, or changes in symptoms. Call us before going to the ED  for breathing or allergy symptoms since we might be able to fit you in for a sick visit. Feel free to contact us anytime with any questions, problems, or concerns.  It was a pleasure to see you again today! Your better half is absolutely delightful!   Websites that have reliable patient information: 1. American Academy of Asthma, Allergy, and Immunology: www.aaaai.org 2. Food Allergy Research and Education (FARE): foodallergy.org 3. Mothers of Asthmatics: http://www.asthmacommunitynetwork.org 4. American College of Allergy, Asthma, and Immunology: MonthlyElectricBill.co.uk   Make sure you are registered to vote! If you have moved or changed any of your contact information, you will need to get this updated before voting!

## 2018-10-31 MED ORDER — IPRATROPIUM-ALBUTEROL 0.5-2.5 (3) MG/3ML IN SOLN: RESPIRATORY_TRACT | 2 refills | Status: DC

## 2018-10-31 MED ORDER — BUDESONIDE-FORMOTEROL FUMARATE 160-4.5 MCG/ACT IN AERO: INHALATION_SPRAY | RESPIRATORY_TRACT | 5 refills | Status: DC

## 2018-11-07 ENCOUNTER — Telehealth: Payer: Self-pay | Admitting: Allergy & Immunology

## 2018-11-07 NOTE — Telephone Encounter (Signed)
PT called in to let us know he switched from [place] to [place]. duel neb has not picked up from pharm because it was $200 and he cannot afford it. PT reached out to an organization that will bring cost down to $25 but dr/nurse has to do a questionaire. Please call [facility] at ph# [phone]. Fax# [phone]

## 2018-11-07 NOTE — Telephone Encounter (Signed)
I called the number given and received a message stating that due to inclement weather the office is closed.

## 2018-11-10 NOTE — Telephone Encounter (Signed)
[place] again and still did not receive an answer.

## 2018-11-11 NOTE — Telephone Encounter (Signed)
I called [facility] and was on hold for approximately 20 minutes. I will have to call back at a later time.

## 2018-11-12 ENCOUNTER — Telehealth: Payer: Self-pay | Admitting: *Deleted

## 2018-11-12 NOTE — Telephone Encounter (Signed)
Called and left message for patient to call office in regards to this matter. 

## 2018-11-12 NOTE — Telephone Encounter (Signed)
Patient called stating he needs to speak with a nurse regarding his medications and insurance with it, patient states he normally talks to [place] about this, but at this point he can talk to whomever. Please advise [phone] today) or ([phone] which will be working tomorrow).

## 2018-11-13 NOTE — Telephone Encounter (Signed)
I called the pharmacy again and still did not get through. Patient has called office again. New notes will be attached to that contact.

## 2018-11-13 NOTE — Telephone Encounter (Addendum)
Patient states the duoneb would cause him over $200. States we would have to go through [employer]. Advised patient that Parks Ranger, CMA has already faxed all the information to pharmacy and he has reach out to them to make sure they received it if more info is needed please fax our office. States he is getting ready to switch insurances in a couple weeks. Writer did advise patient to bring in new card. Patient verbalized understanding.

## 2018-11-27 ENCOUNTER — Telehealth: Payer: Self-pay | Admitting: Pulmonary Disease

## 2018-11-27 NOTE — Telephone Encounter (Signed)
Called patient, unable to reach. Left message to give [name] a call back. Patient was last seen by RA in 01/2017. Per [place] patient is requesting to switch to BQ. Will make patient aware of protocol when he returns phone call. BQ is out of the office until 12/01/18. In the meantime,   RA are you ok with the patient switching?   BQ are you ok with taking over patient care?  Thank you.

## 2018-11-27 NOTE — Telephone Encounter (Signed)
Okay with me to switch 

## 2018-11-28 NOTE — Telephone Encounter (Signed)
Will await response from BQ

## 2018-12-01 ENCOUNTER — Telehealth: Payer: Self-pay | Admitting: Allergy & Immunology

## 2018-12-01 MED ORDER — BUDESONIDE-FORMOTEROL FUMARATE 160-4.5 MCG/ACT IN AERO: INHALATION_SPRAY | RESPIRATORY_TRACT | 5 refills | Status: DC

## 2018-12-01 NOTE — Telephone Encounter (Signed)
Patient is requesting a refill on Symbicort. He said he now has [employer]. He would like it sent to [facility] on [address] and [address].

## 2018-12-01 NOTE — Telephone Encounter (Signed)
Refill sent in patient notified 

## 2018-12-01 NOTE — Telephone Encounter (Signed)
ATC Patient.  Left message to call back to schedule 3min OV with Dr Lake Bells.

## 2018-12-01 NOTE — Telephone Encounter (Signed)
OK by me, 30 min visit

## 2018-12-02 NOTE — Telephone Encounter (Signed)
Called patient on number provided below. Unable to reach. Phone went straight to voicemail. Left a detailed message to give [name] a call back. Per DPR ok to contact patients wife, called patients wife at number provided [phone] and I was unable to reach her and unable to leave a voicemail due to the voicemail not being set up and activated. Called second number provided for wife [phone] and I was unable to reach her. Per wife's voicemail on second number she requested we leave a voicemail stating reason for call, time, and date. Detailed voicemail left. Will await phone call back from patient and/or wife to make 30 minute appointment per BQ.

## 2018-12-03 NOTE — Telephone Encounter (Signed)
LMTCB x2 for pt 

## 2018-12-04 ENCOUNTER — Telehealth: Payer: Self-pay | Admitting: Allergy & Immunology

## 2018-12-04 ENCOUNTER — Emergency Department (HOSPITAL_COMMUNITY): Payer: Medicare HMO

## 2018-12-04 ENCOUNTER — Observation Stay (HOSPITAL_COMMUNITY)
Admission: EM | Admit: 2018-12-04 | Discharge: 2018-12-05 | Disposition: A | Discharge status: Home / Self Care | Payer: Medicare HMO | Attending: Family Medicine | Admitting: Family Medicine

## 2018-12-04 ENCOUNTER — Other Ambulatory Visit: Payer: Self-pay

## 2018-12-04 ENCOUNTER — Encounter (HOSPITAL_COMMUNITY): Payer: Self-pay

## 2018-12-04 DIAGNOSIS — F1014 Alcohol abuse with alcohol-induced mood disorder: Secondary | ICD-10-CM | POA: Diagnosis present

## 2018-12-04 DIAGNOSIS — F329 Major depressive disorder, single episode, unspecified: Secondary | ICD-10-CM | POA: Diagnosis not present

## 2018-12-04 DIAGNOSIS — F419 Anxiety disorder, unspecified: Secondary | ICD-10-CM | POA: Insufficient documentation

## 2018-12-04 DIAGNOSIS — Z85820 Personal history of malignant melanoma of skin: Secondary | ICD-10-CM | POA: Insufficient documentation

## 2018-12-04 DIAGNOSIS — Z7952 Long term (current) use of systemic steroids: Secondary | ICD-10-CM | POA: Diagnosis not present

## 2018-12-04 DIAGNOSIS — Z7951 Long term (current) use of inhaled steroids: Secondary | ICD-10-CM | POA: Insufficient documentation

## 2018-12-04 DIAGNOSIS — Z6830 Body mass index (BMI) 30.0-30.9, adult: Secondary | ICD-10-CM | POA: Insufficient documentation

## 2018-12-04 DIAGNOSIS — Z8611 Personal history of tuberculosis: Secondary | ICD-10-CM | POA: Diagnosis not present

## 2018-12-04 DIAGNOSIS — Z79899 Other long term (current) drug therapy: Secondary | ICD-10-CM | POA: Diagnosis not present

## 2018-12-04 DIAGNOSIS — Z9981 Dependence on supplemental oxygen: Secondary | ICD-10-CM | POA: Diagnosis not present

## 2018-12-04 DIAGNOSIS — J9611 Chronic respiratory failure with hypoxia: Secondary | ICD-10-CM | POA: Diagnosis not present

## 2018-12-04 DIAGNOSIS — Z8546 Personal history of malignant neoplasm of prostate: Secondary | ICD-10-CM | POA: Diagnosis not present

## 2018-12-04 DIAGNOSIS — I10 Essential (primary) hypertension: Secondary | ICD-10-CM | POA: Diagnosis not present

## 2018-12-04 DIAGNOSIS — J069 Acute upper respiratory infection, unspecified: Secondary | ICD-10-CM | POA: Diagnosis present

## 2018-12-04 DIAGNOSIS — Z87891 Personal history of nicotine dependence: Secondary | ICD-10-CM | POA: Insufficient documentation

## 2018-12-04 DIAGNOSIS — J4541 Moderate persistent asthma with (acute) exacerbation: Secondary | ICD-10-CM

## 2018-12-04 DIAGNOSIS — F209 Schizophrenia, unspecified: Secondary | ICD-10-CM | POA: Diagnosis not present

## 2018-12-04 DIAGNOSIS — E669 Obesity, unspecified: Secondary | ICD-10-CM | POA: Insufficient documentation

## 2018-12-04 DIAGNOSIS — K219 Gastro-esophageal reflux disease without esophagitis: Secondary | ICD-10-CM | POA: Diagnosis not present

## 2018-12-04 DIAGNOSIS — R2681 Unsteadiness on feet: Secondary | ICD-10-CM

## 2018-12-04 DIAGNOSIS — G4733 Obstructive sleep apnea (adult) (pediatric): Secondary | ICD-10-CM | POA: Diagnosis not present

## 2018-12-04 DIAGNOSIS — E119 Type 2 diabetes mellitus without complications: Secondary | ICD-10-CM

## 2018-12-04 DIAGNOSIS — J441 Chronic obstructive pulmonary disease with (acute) exacerbation: Principal | ICD-10-CM | POA: Diagnosis present

## 2018-12-04 LAB — COMPREHENSIVE METABOLIC PANEL
ALBUMIN: 4.2 g/dL (ref 3.5–5.0)
ALT: 45 U/L — ABNORMAL HIGH (ref 0–44)
AST: 35 U/L (ref 15–41)
Alkaline Phosphatase: 50 U/L (ref 38–126)
Anion gap: 10 (ref 5–15)
BUN: 10 mg/dL (ref 8–23)
CHLORIDE: 101 mmol/L (ref 98–111)
CO2: 22 mmol/L (ref 22–32)
Calcium: 8.6 mg/dL — ABNORMAL LOW (ref 8.9–10.3)
Creatinine, Ser: 0.97 mg/dL (ref 0.61–1.24)
GFR calc Af Amer: >60 mL/min (ref >60)
GFR calc non Af Amer: >60 mL/min (ref >60)
GLUCOSE: 112 mg/dL — AB (ref 70–99)
Potassium: 4.1 mmol/L (ref 3.5–5.1)
Sodium: 133 mmol/L — ABNORMAL LOW (ref 135–145)
Total Bilirubin: 0.8 mg/dL (ref 0.3–1.2)
Total Protein: 7.3 g/dL (ref 6.5–8.1)

## 2018-12-04 LAB — CBC
HCT: 44.9 % (ref 39.0–52.0)
Hemoglobin: 14.3 g/dL (ref 13.0–17.0)
MCH: 31.6 pg (ref 26.0–34.0)
MCHC: 31.8 g/dL (ref 30.0–36.0)
MCV: 99.1 fL (ref 80.0–100.0)
Platelets: 242 10*3/uL (ref 150–400)
RBC: 4.53 MIL/uL (ref 4.22–5.81)
RDW: 12.4 % (ref 11.5–15.5)
WBC: 7.9 10*3/uL (ref 4.0–10.5)
nRBC: 0 % (ref 0.0–0.2)

## 2018-12-04 LAB — GLUCOSE, CAPILLARY: Glucose-Capillary: 211 mg/dL — ABNORMAL HIGH (ref 70–99)

## 2018-12-04 LAB — I-STAT TROPONIN, ED: Troponin i, poc: 0.02 ng/mL (ref 0.00–0.08)

## 2018-12-04 MED ORDER — ONDANSETRON 4 MG PO TBDP: 8.0000 mg | ORAL_TABLET | Freq: Three times a day (TID) | ORAL | Status: DC | PRN

## 2018-12-04 MED ORDER — FOLIC ACID 1 MG PO TABS
1.0000 mg | ORAL_TABLET | Freq: Every day | ORAL | Status: DC
  Administered 2018-12-05: 1 mg via ORAL
  Filled 2018-12-04: qty 1

## 2018-12-04 MED ORDER — VITAMIN B-1 100 MG PO TABS
100.0000 mg | ORAL_TABLET | Freq: Every day | ORAL | Status: DC
  Administered 2018-12-05: 100 mg via ORAL
  Filled 2018-12-04: qty 1

## 2018-12-04 MED ORDER — IPRATROPIUM BROMIDE 0.02 % IN SOLN
0.5000 mg | Freq: Once | RESPIRATORY_TRACT | Status: AC
  Administered 2018-12-04: 0.5 mg via RESPIRATORY_TRACT
  Filled 2018-12-04: qty 2.5

## 2018-12-04 MED ORDER — AZITHROMYCIN 250 MG PO TABS: 500.0000 mg | ORAL_TABLET | Freq: Every day | ORAL | Status: DC

## 2018-12-04 MED ORDER — UMECLIDINIUM BROMIDE 62.5 MCG/INH IN AEPB
1.0000 | INHALATION_SPRAY | Freq: Every day | RESPIRATORY_TRACT | Status: DC
  Administered 2018-12-05: 1 via RESPIRATORY_TRACT
  Filled 2018-12-04: qty 7

## 2018-12-04 MED ORDER — INSULIN ASPART 100 UNIT/ML ~~LOC~~ SOLN
0.0000 [IU] | Freq: Three times a day (TID) | SUBCUTANEOUS | Status: DC
  Administered 2018-12-05: 2 [IU] via SUBCUTANEOUS
  Administered 2018-12-05: 3 [IU] via SUBCUTANEOUS

## 2018-12-04 MED ORDER — LORAZEPAM 2 MG/ML IJ SOLN
1.0000 mg | Freq: Four times a day (QID) | INTRAMUSCULAR | Status: DC | PRN
  Administered 2018-12-04: 1 mg via INTRAVENOUS
  Filled 2018-12-04: qty 1

## 2018-12-04 MED ORDER — ADULT MULTIVITAMIN W/MINERALS CH
1.0000 | ORAL_TABLET | Freq: Every day | ORAL | Status: DC
  Administered 2018-12-05: 1 via ORAL
  Filled 2018-12-04: qty 1

## 2018-12-04 MED ORDER — PANTOPRAZOLE SODIUM 40 MG PO TBEC
80.0000 mg | DELAYED_RELEASE_TABLET | Freq: Every day | ORAL | Status: DC
  Administered 2018-12-05: 80 mg via ORAL
  Filled 2018-12-04: qty 2

## 2018-12-04 MED ORDER — BUDESONIDE 0.25 MG/2ML IN SUSP
0.2500 mg | Freq: Two times a day (BID) | RESPIRATORY_TRACT | Status: DC
  Administered 2018-12-05 (×2): 0.25 mg via RESPIRATORY_TRACT
  Filled 2018-12-04 (×2): qty 2

## 2018-12-04 MED ORDER — PREDNISONE 20 MG PO TABS
40.0000 mg | ORAL_TABLET | Freq: Every day | ORAL | Status: DC
  Administered 2018-12-05: 40 mg via ORAL
  Filled 2018-12-04: qty 2

## 2018-12-04 MED ORDER — MAGNESIUM SULFATE 2 GM/50ML IV SOLN
2.0000 g | Freq: Once | INTRAVENOUS | Status: AC
  Administered 2018-12-04: 2 g via INTRAVENOUS
  Filled 2018-12-04: qty 50

## 2018-12-04 MED ORDER — BACLOFEN 10 MG PO TABS: 10.0000 mg | ORAL_TABLET | Freq: Every day | ORAL | Status: DC | PRN

## 2018-12-04 MED ORDER — METHYLPREDNISOLONE SODIUM SUCC 125 MG IJ SOLR
125.0000 mg | Freq: Once | INTRAMUSCULAR | Status: AC
  Administered 2018-12-04: 125 mg via INTRAVENOUS
  Filled 2018-12-04: qty 2

## 2018-12-04 MED ORDER — ALBUTEROL SULFATE (2.5 MG/3ML) 0.083% IN NEBU
5.0000 mg | INHALATION_SOLUTION | Freq: Once | RESPIRATORY_TRACT | Status: AC
  Administered 2018-12-04: 5 mg via RESPIRATORY_TRACT
  Filled 2018-12-04: qty 6

## 2018-12-04 MED ORDER — ALBUTEROL (5 MG/ML) CONTINUOUS INHALATION SOLN
10.0000 mg/h | INHALATION_SOLUTION | RESPIRATORY_TRACT | Status: DC
  Administered 2018-12-04: 10 mg/h via RESPIRATORY_TRACT
  Filled 2018-12-04: qty 20

## 2018-12-04 MED ORDER — LORAZEPAM 1 MG PO TABS: 1.0000 mg | ORAL_TABLET | Freq: Four times a day (QID) | ORAL | Status: DC | PRN

## 2018-12-04 MED ORDER — BISOPROLOL FUMARATE 5 MG PO TABS
5.0000 mg | ORAL_TABLET | Freq: Every evening | ORAL | Status: DC
  Administered 2018-12-04: 5 mg via ORAL
  Filled 2018-12-04: qty 1

## 2018-12-04 MED ORDER — ENOXAPARIN SODIUM 40 MG/0.4ML ~~LOC~~ SOLN
40.0000 mg | Freq: Every day | SUBCUTANEOUS | Status: DC
  Administered 2018-12-04: 40 mg via SUBCUTANEOUS
  Filled 2018-12-04: qty 0.4

## 2018-12-04 MED ORDER — THIAMINE HCL 100 MG/ML IJ SOLN
100.0000 mg | Freq: Every day | INTRAMUSCULAR | Status: DC
  Filled 2018-12-04: qty 2

## 2018-12-04 MED ORDER — SODIUM CHLORIDE 0.9 % IV SOLN
500.0000 mg | INTRAVENOUS | Status: AC
  Administered 2018-12-05: 500 mg via INTRAVENOUS
  Filled 2018-12-04: qty 500

## 2018-12-04 MED ORDER — MOMETASONE FURO-FORMOTEROL FUM 200-5 MCG/ACT IN AERO: 2.0000 | INHALATION_SPRAY | Freq: Two times a day (BID) | RESPIRATORY_TRACT | Status: DC

## 2018-12-04 MED ORDER — ALBUTEROL SULFATE (2.5 MG/3ML) 0.083% IN NEBU: 2.5000 mg | INHALATION_SOLUTION | RESPIRATORY_TRACT | Status: DC | PRN

## 2018-12-04 NOTE — ED Provider Notes (Signed)
[place] DEPT Provider Note   CSN: [id number] Arrival date & time: 12/04/18  1943    History   Chief Complaint Chief Complaint  Patient presents with  . Shortness of Breath    HPI [name] is a 68 y.o. male.     Patient with hx copd, c/o progressive sob in the past 2 weeks. Symptoms gradual onset, moderate-severe, persistent, slowly worsening. +non prod cough. Nasal congestion, achy. Denies headache. No neck pain or stiffness. Also w intermittent chest pain in past couple weeks, mid chest, at rest, episodic, lasts seconds to minutes. No constant and/or pleuritic chest pain. No leg pain or swelling. On home prednisone on chronic basis 10 mg. Uses mdi/neb txs regularly. Former smoker, not for years.   The history is provided by the patient.  Shortness of Breath  Associated symptoms: cough and wheezing   Associated symptoms: no abdominal pain, no chest pain, no fever, no headaches, no neck pain, no rash, no sore throat and no vomiting     Past Medical History:  Diagnosis Date  . Anxiety disorder   . Arthritis    BILATERAL SHOULDERS, ELBOWS AND HANDS AND LEFT HIP AND KNEES--HX CORTISONE SHOTS IN SHOULDERS, ELBOWS, HIP AND KNEES  . Basal cell carcinoma of back 04/30/2016   Dermatologist- Dr Nevada Crane;   MOHS sx- Dr Levada Dy   . Bladder outlet obstruction   . Chronic idiopathic constipation 05/08/2015  . Complication of anesthesia    DIFFICULT WAKING   . COPD (chronic obstructive pulmonary disease) Gold C Frequent exacerbations 01/13/2014   [name] 07/08/14: FeV1 51% FeV1/FVC 66% FVC 59% 10/5/2015ONO RA was normal  10/13/2014  ONO on RA NORMAL   . COPD, frequent exacerbations ([place])    pulmologist-  dr Joya Gaskins--  [place] Rm Stage C.04-25-15 recent COPD exacerbation-much improved now, after tx. in ER [facility].  Marland Kitchen Depression   . Diabetes mellitus without complication ([place])    BODERLINE - DIET CONTROL  . Dysrhythmia    PVC'S  . Emphysema lung ([place])    stage 2  . Family history of adverse reaction to anesthesia    father would wake up with agitation   . Former smoker 01/13/2014  . Gastroesophageal reflux disease without esophagitis   . Heavy alcohol use 04/30/2016  . History of chronic bronchitis   . History of oxygen administration    oxygen use 2 l/m nasally at bedtime and exertional occasions  . History of rheumatic fever   . History of TB (tuberculosis)    1984--  hospitalized for 4 month treatment  . History of urinary retention   . Hx of multiple concussions    x 2 per patient   . Hypertension   . Melanoma ([place])   . Nocturnal oxygen desaturation    USES O2 NIGHTLY  . OSA (obstructive sleep apnea) 01/13/2014  . PONV (postoperative nausea and vomiting)   . Post-polio syndrome    polio at age 47--PT WAS IN IRON LUNG; PT WAS IN W/C UNTIL AGE 44; STILL HAS WEAKNESS RIGHT SIDE  . Prostate cancer ([place])   . Schizophrenia ([place])   . Tuberculosis    Hosp 4 months rx , left early   . Urticaria     Patient Active Problem List   Diagnosis Date Noted  . PSVT (paroxysmal supraventricular tachycardia) ([place]) 09/23/2018  . Seasonal and perennial allergic rhinitis 07/30/2018  . COPD with asthma ([place]) 07/07/2018  . S/P arthroscopy of right shoulder 04/25/2017  . Generalized anxiety  disorder 04/22/2017  . Severe persistent asthma without complication 56/31/4970  . Bee sting-induced anaphylaxis 02/12/2017  . Anaphylactic reaction due to food, initial encounter 02/12/2017  . Current use of beta blocker 02/12/2017  . Allergic rhinitis 01/15/2017  . COPD exacerbation ([place]) 11/04/2016  . Asthma exacerbation 11/03/2016  . Nausea without vomiting 09/17/2016  . Shortness of breath   . Diabetes mellitus type 2 in obese ([place]) 06/26/2016  . non-specific Chest pain 06/12/2016  . Patient's noncompliance with other medical treatment and regimen 06/02/2016  . Hyperlipidemia, mixed 06/02/2016  . Elevated liver enzymes  06/02/2016  . Schizophrenia  ([place]) 04/30/2016  . Environmental and seasonal allergies 04/30/2016  . Diabetes mellitus without complication- (diet controlled; w/o proteinuria) 04/30/2016  . Heavy alcohol use- Many yrs  04/30/2016  . Basal cell carcinoma of back 04/30/2016  . Obesity 04/30/2016  . Hx of multiple concussions 04/30/2016  . h/o Vitamin D deficiency 04/30/2016  . Screen for sexually transmitted diseases 04/30/2016  . Immunocompromised state ([place]) 04/30/2016  . Cervical radiculopathy 01/15/2016  . Alcohol withdrawal ([place]) 11/22/2015  . Alcohol abuse with alcohol-induced mood disorder ([place]) 11/22/2015  . h/o Suicidal thoughts 11/21/2015  . Dry mouth 10/07/2015  . Anxiety 09/19/2015  . OAB (overactive bladder) 07/27/2015  . Chronic idiopathic constipation 05/08/2015  . Screening for colon cancer   . Benign neoplasm of cecum   . Benign neoplasm of ascending colon   . Benign neoplasm of transverse colon   . Benign neoplasm of sigmoid colon   . Rectal polyp   . Gastroesophageal reflux disease without esophagitis   . Esophageal stricture   . Joint stiffness of multiple sites 10/17/2014  . Benign localized hyperplasia of prostate with urinary obstruction 09/29/2014  . concerns for memory loss 09/13/2014  . Tuberculosis   . Diverticulum of bladder 06/17/2014  . Acute urinary retention 05/31/2014  . Neuropathy ([place]) 04/11/2014  . Post-polio syndrome 01/13/2014  . Other emphysema ([place]) 01/13/2014  . Hypertension 01/13/2014  . Former smoker 01/13/2014  . h/o Prostate cancer 01/13/2014  . History of tuberculosis 01/13/2014  . Bipolar disorder ([place]) 01/13/2014  . Explosive personality disorder ([place]) 01/13/2014  . Erectile dysfunction 01/13/2014  . Nephrolithiasis 01/13/2014  . Chronic pain 01/13/2014  . Obstructive sleep apnea 01/13/2014  . Alcoholic gastritis 26/37/8588  . Nocturnal hypoxia 08/10/2013    Past Surgical History:  Procedure Laterality Date  . CARDIOVASCULAR STRESS TEST  06-08-2014   dr Mare Ferrari   normal lexiscan study/  no ischemia/  not gated due to PAC's  . COLONOSCOPY N/A 05/03/2015   Procedure: COLONOSCOPY;  Surgeon: [name], MD;  Location: [facility];  Service: Endoscopy;  Laterality: N/A;  . CYSTOSCOPY N/A 10/25/2015   Procedure: CYSTOSCOPY;  Surgeon: [name], MD;  Location: [facility];  Service: Urology;  Laterality: N/A;  . CYSTOSCOPY W/ CYSTOGRAM/  TRANSRECTAL ULTRASOUND PROSTATE BX  03-22-2009  . ESOPHAGOGASTRODUODENOSCOPY N/A 03/22/2015   Procedure: ESOPHAGOGASTRODUODENOSCOPY (EGD) with dilation;  Surgeon: [name], MD;  Location: [facility];  Service: Endoscopy;  Laterality: N/A;  . excision of skin lesion    . LAPAROSCOPIC CHOLECYSTECTOMY  2013  . left elbow surgery      due to fracture   . NASAL SEPTUM SURGERY  2000  . OTHER SURGICAL HISTORY      Muscle & bone Graft/Polio  . polio surgeries      14 polio surgeries   . PROSTATE BIOPSY N/A 09/28/2014   Procedure: PROSTATE ULTRASOUND/BIOPSY;  Surgeon: Jenny Reichmann  Keene Breath, MD;  Location: [facility];  Service: Urology;  Laterality: N/A;  . PROSTATE BIOPSY N/A 10/25/2015   Procedure: PROSTATE BIOPSY AND ULTRASOUND;  Surgeon: [name], MD;  Location: [facility];  Service: Urology;  Laterality: N/A;  . SAVORY DILATION N/A 03/22/2015   Procedure: SAVORY DILATION;  Surgeon: [name], MD;  Location: [facility];  Service: Endoscopy;  Laterality: N/A;  . SHOULDER ARTHROSCOPY WITH OPEN ROTATOR CUFF REPAIR Bilateral 2013  &  1999   removal spurs and bursectomy  . TRANSURETHRAL INCISION OF BLADDER NECK N/A 10/25/2015   Procedure:  TRANSURETHRAL INCISION OF BLADDER NECK;  Surgeon: [name], MD;  Location: [facility];  Service: Urology;  Laterality: N/A;  . TRANSURETHRAL RESECTION OF PROSTATE N/A 09/28/2014   Procedure: TRANSURETHRAL RESECTION OF THE PROSTATE (TURP);  Surgeon: [name], MD;  Location: [facility];  Service: Urology;  Laterality: N/A;  . URETEROSOPY STONE EXTRACTION  2000        Home Medications     Prior to Admission medications   Medication Sig Start Date End Date Taking? Authorizing Provider  acetaminophen (TYLENOL) 500 MG tablet Take 500 mg by mouth every 6 (six) hours as needed for headache (pain).    [provider]  albuterol (PROAIR HFA) 108 (90 Base) MCG/ACT inhaler Inhale 2 puffs into the lungs every 4 (four) hours as needed for wheezing or shortness of breath. 06/09/18   Valentina Shaggy, MD  albuterol (PROVENTIL) (2.5 MG/3ML) 0.083% nebulizer solution Take 2.5 mg by nebulization every 6 (six) hours as needed for wheezing or shortness of breath.    [provider]  ALPRAZolam [name]) 0.5 MG tablet Take 0.5 mg by mouth daily as needed for anxiety.  02/21/18   [provider]  arformoterol (BROVANA) 15 MCG/2ML NEBU Take 2 mLs (15 mcg total) by nebulization 2 (two) times daily. 10/20/18   Valentina Shaggy, MD  baclofen (LIORESAL) 10 MG tablet Take 10 mg by mouth daily as needed.     [provider]  bisoprolol (ZEBETA) 5 MG tablet Take 5 mg by mouth daily.    [provider]  budesonide (PULMICORT) 0.25 MG/2ML nebulizer solution Take 2 mLs (0.25 mg total) by nebulization 2 (two) times daily. 05/30/18   Valentina Shaggy, MD  budesonide-formoterol [facility]) 160-4.5 MCG/ACT inhaler Two puffs with spacer twice a day to prevent cough or wheeze.  Rinse, gargle and spit after use. 12/01/18   Valentina Shaggy, MD  EPINEPHrine 0.3 mg/0.3 mL IJ SOAJ injection Inject 0.3 mLs (0.3 mg total) into the muscle once. Patient taking differently: Inject 0.3 mg into the muscle once as needed (severe allergic reactions).  02/24/16   Malvin Johns, MD  esomeprazole (NEXIUM) 40 MG capsule Take 40 mg by mouth daily at 12 noon.    [provider]  fluticasone (FLONASE) 50 MCG/ACT nasal spray Place 1 spray into both nostrils daily as needed. 05/30/18   Valentina Shaggy, MD  ipratropium-albuterol (DUONEB) 0.5-2.5 (3) MG/3ML SOLN One vial  in nebulizer every 4-6 hours as needed for cough or wheeze. 10/31/18   Valentina Shaggy, MD  loratadine (CLARITIN) 10 MG tablet Take 1 tablet (10 mg total) by mouth as needed. 05/30/18   Valentina Shaggy, MD  Menthol, Topical Analgesic, (BIOFREEZE EX) Apply 1 application topically daily as needed (knee pain). TO THE LEFT KNEE AS NEEDED FOR PAIN     [provider]  nitroGLYCERIN (NITROSTAT) 0.4 MG SL tablet Place 1  tablet (0.4 mg total) under the tongue every 5 (five) minutes as needed for chest pain. 06/21/15   Brunetta Jeans, PA-C  ondansetron (ZOFRAN ODT) 8 MG disintegrating tablet Take 1 tablet (8 mg total) by mouth every 8 (eight) hours as needed for nausea or vomiting. 03/28/17   Molpus, John, MD  OXYGEN Inhale 2 L into the lungs at bedtime. DURING THE DAY IF NEEDED Reported on 11/25/2015    [provider]  predniSONE (DELTASONE) 10 MG tablet Take 1 tablet (10 mg total) by mouth daily. 05/30/18   Valentina Shaggy, MD  Tiotropium Bromide Monohydrate (SPIRIVA RESPIMAT) 2.5 MCG/ACT AERS Inhale 1 puff into the lungs 1 day or 1 dose. 10/02/18   Valentina Shaggy, MD  Vitamin D, Ergocalciferol, (DRISDOL) 50000 units CAPS capsule Take 50,000 Units by mouth every 7 (seven) days.    [provider]  YUPELRI 175 MCG/3ML SOLN Inhale 1 vial into the lungs daily. Patient not taking: Reported on 10/30/2018 10/20/18   Valentina Shaggy, MD    Family History Family History  Problem Relation Age of Onset  . Alzheimer's disease Father 4       Deceased  . Stomach cancer Father   . Heart attack Father   . Heart disease Father   . Skin cancer Mother        Facial-Living  . Cancer Mother        skin  . Alcohol abuse Sister        x2  . Mental illness Sister        x2  . Alcohol abuse Sister   . Diabetes Maternal Aunt        x2  . Thyroid disease Maternal Aunt        x4  . Diabetes Maternal Uncle   . Tuberculosis Paternal Grandfather   . Tuberculosis  Paternal Grandmother   . Alzheimer's disease Paternal Aunt   . Alzheimer's disease Paternal Uncle   . Colon cancer Neg Hx   . Colon polyps Neg Hx   . Crohn's disease Neg Hx   . Ulcerative colitis Neg Hx   . Allergic rhinitis Neg Hx   . Angioedema Neg Hx   . Asthma Neg Hx   . Eczema Neg Hx   . Urticaria Neg Hx     Social History Social History   Tobacco Use  . Smoking status: Former Smoker    Years: 47.00    Types: Pipe    Last attempt to quit: 08/25/2013    Years since quitting: 5.2  . Smokeless tobacco: Former Systems developer    Types: Chew  . Tobacco comment: 5-6 times smoking a pipe  Substance Use Topics  . Alcohol use: Yes    Alcohol/week: 14.0 standard drinks    Types: 14 Cans of beer per week    Comment: 2 cans daily  . Drug use: No     Allergies   Aspirin; Hornet venom; Ivp dye [iodinated diagnostic agents]; Levaquin [levofloxacin in d5w]; Nsaids; Penicillins; Tolmetin; Fasenra [benralizumab]; Perforomist [formoterol]; Buprenorphine hcl; Morphine and related; and Oxycodone   Review of Systems Review of Systems  Constitutional: Negative for fever.  HENT: Positive for congestion and rhinorrhea. Negative for sore throat.   Eyes: Negative for redness.  Respiratory: Positive for cough, shortness of breath and wheezing.   Cardiovascular: Negative for chest pain.  Gastrointestinal: Negative for abdominal pain, diarrhea and vomiting.  Endocrine: Negative for polyuria.  Genitourinary: Negative for flank pain.  Musculoskeletal: Positive  for myalgias. Negative for neck pain and neck stiffness.  Skin: Negative for rash.  Neurological: Negative for headaches.  Hematological: Does not bruise/bleed easily.  Psychiatric/Behavioral: Negative for confusion.     Physical Exam Updated Vital Signs BP (!) 157/68 (BP Location: Left Arm)   Pulse 73   Temp 97.8 F (36.6 C) (Oral)   Resp (!) 21   SpO2 97%   Physical Exam Vitals signs and nursing note reviewed.  Constitutional:        Appearance: Normal appearance. He is well-developed.  HENT:     Head: Atraumatic.     Nose: Nose normal.     Mouth/Throat:     Mouth: Mucous membranes are moist.     Pharynx: Oropharynx is clear.  Eyes:     General: No scleral icterus.    Conjunctiva/sclera: Conjunctivae normal.     Pupils: Pupils are equal, round, and reactive to light.  Neck:     Musculoskeletal: Normal range of motion and neck supple. No neck rigidity.     Trachea: No tracheal deviation.  Cardiovascular:     Rate and Rhythm: Normal rate and regular rhythm.     Pulses: Normal pulses.     Heart sounds: Normal heart sounds. No murmur. No friction rub. No gallop.   Pulmonary:     Effort: Respiratory distress present. No accessory muscle usage.     Breath sounds: Wheezing present.  Abdominal:     General: Bowel sounds are normal. There is no distension.     Palpations: Abdomen is soft.     Tenderness: There is no abdominal tenderness. There is no guarding.  Genitourinary:    Comments: No cva tenderness. Musculoskeletal:        General: No swelling or tenderness.     Right lower leg: No edema.     Left lower leg: No edema.  Skin:    General: Skin is warm and dry.     Findings: No rash.  Neurological:     Mental Status: He is alert.     Comments: Alert, speech clear.   Psychiatric:        Mood and Affect: Mood normal.      ED Treatments / Results  Labs (all labs ordered are listed, but only abnormal results are displayed) Results for orders placed or performed during the hospital encounter of 12/04/18  CBC  Result Value Ref Range   WBC 7.9 4.0 - 10.5 K/uL   RBC 4.53 4.22 - 5.81 MIL/uL   Hemoglobin 14.3 13.0 - 17.0 g/dL   HCT 44.9 39.0 - 52.0 %   MCV 99.1 80.0 - 100.0 fL   MCH 31.6 26.0 - 34.0 pg   MCHC 31.8 30.0 - 36.0 g/dL   RDW 12.4 11.5 - 15.5 %   Platelets 242 150 - 400 K/uL   nRBC 0.0 0.0 - 0.2 %  Comprehensive metabolic panel  Result Value Ref Range   Sodium 133 (L) 135 - 145 mmol/L    Potassium 4.1 3.5 - 5.1 mmol/L   Chloride 101 98 - 111 mmol/L   CO2 22 22 - 32 mmol/L   Glucose, Bld 112 (H) 70 - 99 mg/dL   BUN 10 8 - 23 mg/dL   Creatinine, Ser 0.97 0.61 - 1.24 mg/dL   Calcium 8.6 (L) 8.9 - 10.3 mg/dL   Total Protein 7.3 6.5 - 8.1 g/dL   Albumin 4.2 3.5 - 5.0 g/dL   AST 35 15 - 41 U/L   ALT  45 (H) 0 - 44 U/L   Alkaline Phosphatase 50 38 - 126 U/L   Total Bilirubin 0.8 0.3 - 1.2 mg/dL   GFR calc non Af Amer >60 >60 mL/min   GFR calc Af Amer >60 >60 mL/min   Anion gap 10 5 - 15  I-stat troponin, ED  Result Value Ref Range   Troponin i, poc 0.02 0.00 - 0.08 ng/mL   Comment 3           Dg Chest Port 1 View  Result Date: 12/04/2018 CLINICAL DATA:  67 y/o  M; shortness of breath and cough. EXAM: PORTABLE CHEST 1 VIEW COMPARISON:  10/25/2017 chest radiograph FINDINGS: Stable heart size and mediastinal contours are within normal limits. Both lungs are clear. The visualized skeletal structures are unremarkable. IMPRESSION: No active disease. Electronically Signed   By: Kristine Garbe M.D.   On: 12/04/2018 20:21    EKG EKG Interpretation  Date/Time:  Thursday December 04 2018 20:11:02 EST Ventricular Rate:  70 PR Interval:    QRS Duration: 99 QT Interval:  401 QTC Calculation: 433 R Axis:   70 Text Interpretation:  Sinus rhythm No significant change since last tracing Confirmed by Lajean Saver [id number]) on 12/04/2018 9:27:53 PM   Radiology Dg Chest Port 1 View  Result Date: 12/04/2018 CLINICAL DATA:  68 y/o  M; shortness of breath and cough. EXAM: PORTABLE CHEST 1 VIEW COMPARISON:  10/25/2017 chest radiograph FINDINGS: Stable heart size and mediastinal contours are within normal limits. Both lungs are clear. The visualized skeletal structures are unremarkable. IMPRESSION: No active disease. Electronically Signed   By: Kristine Garbe M.D.   On: 12/04/2018 20:21    Procedures Procedures (including critical care time)  Medications Ordered in  ED Medications  albuterol (PROVENTIL) (2.5 MG/3ML) 0.083% nebulizer solution 5 mg (has no administration in time range)  ipratropium (ATROVENT) nebulizer solution 0.5 mg (has no administration in time range)  methylPREDNISolone sodium succinate (SOLU-MEDROL) 125 mg/2 mL injection 125 mg (has no administration in time range)     Initial Impression / Assessment and Plan / ED Course  I have reviewed the triage vital signs and the nursing notes.  Pertinent labs & imaging results that were available during my care of the patient were reviewed by me and considered in my medical decision making (see chart for details).  Iv ns. Continuous pulse ox and monitor. o2 [place]. Stat labs and pcxr. ecg.  Albuterol and atrovent neb.   Reviewed nursing notes and prior charts for additional history.   Solumedrol iv.   Additional albuterol and atrovent nebs.   Labs reviewed - chem normal.  cxr reviewed - no pna.   Persistent wheezing, tight. Respiratory therapy consulted. Continuous albuterol neb treatments. Magnesium iv.   Hospitalists consulted for admission.  CRITICAL CARE  RE: asthma/COPD exacerbation, dyspnea, hypoxia, continuous nebulizer treatments.  Performed by: Mirna Mires Total critical care time: 65 minutes Critical care time was exclusive of separately billable procedures and treating other patients. Critical care was necessary to treat or prevent imminent or life-threatening deterioration. Critical care was time spent personally by me on the following activities: development of treatment plan with patient and/or surrogate as well as nursing, discussions with consultants, evaluation of patient's response to treatment, examination of patient, obtaining history from patient or surrogate, ordering and performing treatments and interventions, ordering and review of laboratory studies, ordering and review of radiographic studies, pulse oximetry and re-evaluation of patient's  condition.   Final Clinical Impressions(s) /  ED Diagnoses   Final diagnoses:  None    ED Discharge Orders    None       Lajean Saver, MD 12/04/18 [id number]

## 2018-12-04 NOTE — Telephone Encounter (Signed)
Byrd's wife called to let me know that he was having increased trouble breathing. I asked whether I needed to send in an antibiotic and she was unsure. He is on prednisone and I recommended increasing to 60mg  for five days. Given the fact that he has nebulizer treatments at home and he has prednisone at home, if his wife thought that he needed additional help, I recommended that she take him to [facility] for an evaluation. Wife in agreement with the plan.   [name], MD [facility] [place] [facility] [place]

## 2018-12-04 NOTE — Telephone Encounter (Signed)
LMTCB x3 for pt.  

## 2018-12-04 NOTE — H&P (Signed)
History and Physical    [name] DOB: Jul 27, 1951 DOA: 12/04/2018  PCP: Levin Bacon, NP  Patient coming from: Home  I have personally briefly reviewed patient's old medical records in [place]  Chief Complaint: SOB  HPI: [name] is a 68 y.o. male with medical history significant of COPD / asthma, steroid dependent.  Patient presents to the ED with c/o progressively worsening SOB over the past 2 weeks.  Mod-severe.  Non-productive cough.  Nasal congestion.  No headache, no neck pain nor stiffness.  Former smoker but not for years.   ED Course: Given solumedrol, Mg, neb treatments.  Some improvement but still wheezy.  Hospitalist asked to admit.   Review of Systems: As per HPI otherwise 10 point review of systems negative.   Past Medical History:  Diagnosis Date  . Anxiety disorder   . Arthritis    BILATERAL SHOULDERS, ELBOWS AND HANDS AND LEFT HIP AND KNEES--HX CORTISONE SHOTS IN SHOULDERS, ELBOWS, HIP AND KNEES  . Basal cell carcinoma of back 04/30/2016   Dermatologist- Dr Nevada Crane;   MOHS sx- Dr Levada Dy   . Bladder outlet obstruction   . Chronic idiopathic constipation 05/08/2015  . Complication of anesthesia    DIFFICULT WAKING   . COPD (chronic obstructive pulmonary disease) [place] C Frequent exacerbations 01/13/2014   [name] 07/08/14: FeV1 51% FeV1/FVC 66% FVC 59% 10/5/2015ONO RA was normal  10/13/2014  ONO on RA NORMAL   . COPD, frequent exacerbations ([place])    pulmologist-  dr Joya Gaskins--  [place] Rm Stage C.04-25-15 recent COPD exacerbation-much improved now, after tx. in ER [facility].  Marland Kitchen Depression   . Diabetes mellitus without complication ([place])    BODERLINE - DIET CONTROL  . Dysrhythmia    PVC'S  . Emphysema lung ([place])    stage 2  . Family history of adverse reaction to anesthesia    father would wake up with agitation   . Former smoker 01/13/2014  . Gastroesophageal reflux disease without esophagitis   . Heavy alcohol use 04/30/2016  . History  of chronic bronchitis   . History of oxygen administration    oxygen use 2 l/m nasally at bedtime and exertional occasions  . History of rheumatic fever   . History of TB (tuberculosis)    1984--  hospitalized for 4 month treatment  . History of urinary retention   . Hx of multiple concussions    x 2 per patient   . Hypertension   . Melanoma ([place])   . Nocturnal oxygen desaturation    USES O2 NIGHTLY  . OSA (obstructive sleep apnea) 01/13/2014  . PONV (postoperative nausea and vomiting)   . Post-polio syndrome    polio at age 30--PT WAS IN IRON LUNG; PT WAS IN W/C UNTIL AGE 73; STILL HAS WEAKNESS RIGHT SIDE  . Prostate cancer ([place])   . Schizophrenia ([place])   . Tuberculosis    Hosp 4 months rx , left early   . Urticaria     Past Surgical History:  Procedure Laterality Date  . CARDIOVASCULAR STRESS TEST  06-08-2014  dr Mare Ferrari   normal lexiscan study/  no ischemia/  not gated due to PAC's  . COLONOSCOPY N/A 05/03/2015   Procedure: COLONOSCOPY;  Surgeon: [name], MD;  Location: [facility];  Service: Endoscopy;  Laterality: N/A;  . CYSTOSCOPY N/A 10/25/2015   Procedure: CYSTOSCOPY;  Surgeon: [name], MD;  Location: [facility];  Service: Urology;  Laterality: N/A;  . CYSTOSCOPY W/  CYSTOGRAM/  TRANSRECTAL ULTRASOUND PROSTATE BX  03-22-2009  . ESOPHAGOGASTRODUODENOSCOPY N/A 03/22/2015   Procedure: ESOPHAGOGASTRODUODENOSCOPY (EGD) with dilation;  Surgeon: [name], MD;  Location: [facility];  Service: Endoscopy;  Laterality: N/A;  . excision of skin lesion    . LAPAROSCOPIC CHOLECYSTECTOMY  2013  . left elbow surgery      due to fracture   . NASAL SEPTUM SURGERY  2000  . OTHER SURGICAL HISTORY      Muscle & bone Graft/Polio  . polio surgeries      14 polio surgeries   . PROSTATE BIOPSY N/A 09/28/2014   Procedure: PROSTATE ULTRASOUND/BIOPSY;  Surgeon: [name], MD;  Location: [facility];  Service: Urology;  Laterality: N/A;  . PROSTATE BIOPSY N/A 10/25/2015   Procedure:  PROSTATE BIOPSY AND ULTRASOUND;  Surgeon: [name], MD;  Location: [facility];  Service: Urology;  Laterality: N/A;  . SAVORY DILATION N/A 03/22/2015   Procedure: SAVORY DILATION;  Surgeon: [name], MD;  Location: [facility];  Service: Endoscopy;  Laterality: N/A;  . SHOULDER ARTHROSCOPY WITH OPEN ROTATOR CUFF REPAIR Bilateral 2013  &  1999   removal spurs and bursectomy  . TRANSURETHRAL INCISION OF BLADDER NECK N/A 10/25/2015   Procedure:  TRANSURETHRAL INCISION OF BLADDER NECK;  Surgeon: [name], MD;  Location: [facility];  Service: Urology;  Laterality: N/A;  . TRANSURETHRAL RESECTION OF PROSTATE N/A 09/28/2014   Procedure: TRANSURETHRAL RESECTION OF THE PROSTATE (TURP);  Surgeon: [name], MD;  Location: [facility];  Service: Urology;  Laterality: N/A;  . URETEROSOPY STONE EXTRACTION  2000     reports that he quit smoking about 5 years ago. His smoking use included pipe. He quit after 47.00 years of use. He has quit using smokeless tobacco.  His smokeless tobacco use included chew. He reports current alcohol use of about 14.0 standard drinks of alcohol per week. He reports that he does not use drugs.  Allergies  Allergen Reactions  . Aspirin Anaphylaxis  . Hornet Venom Anaphylaxis  . Ivp Dye [Iodinated Diagnostic Agents] Anaphylaxis  . Levaquin [Levofloxacin In D5w] Shortness Of Breath and Swelling    In addition: sweating, chest pain, and diarrhea.   . Nsaids Anaphylaxis  . Penicillins Anaphylaxis    Heart stops Has patient had a PCN reaction causing immediate rash, facial/tongue/throat swelling, SOB or lightheadedness with hypotension: yes Has patient had a PCN reaction causing severe rash involving mucus membranes or skin necrosis: NO Has patient had a PCN reaction that required hospitalization yes Has patient had a PCN reaction occurring within the last 10 years: No If all of the above answers are "NO", then may proceed with Cephalosporin use.   . Tolmetin Anaphylaxis  .  Fasenra [Benralizumab] Hives  . Perforomist [Formoterol]     Increased wheezing, shortness of breath  . Buprenorphine Hcl Nausea And Vomiting    Can take with zofran   . Morphine And Related Nausea And Vomiting    Can take with zofran   . Oxycodone Itching and Rash    Family History  Problem Relation Age of Onset  . Alzheimer's disease Father 4       Deceased  . Stomach cancer Father   . Heart attack Father   . Heart disease Father   . Skin cancer Mother        Facial-Living  . Cancer Mother        skin  . Alcohol abuse Sister  x2  . Mental illness Sister        x2  . Alcohol abuse Sister   . Diabetes Maternal Aunt        x2  . Thyroid disease Maternal Aunt        x4  . Diabetes Maternal Uncle   . Tuberculosis Paternal Grandfather   . Tuberculosis Paternal Grandmother   . Alzheimer's disease Paternal Aunt   . Alzheimer's disease Paternal Uncle   . Colon cancer Neg Hx   . Colon polyps Neg Hx   . Crohn's disease Neg Hx   . Ulcerative colitis Neg Hx   . Allergic rhinitis Neg Hx   . Angioedema Neg Hx   . Asthma Neg Hx   . Eczema Neg Hx   . Urticaria Neg Hx      Prior to Admission medications   Medication Sig Start Date End Date Taking? Authorizing Provider  acetaminophen (TYLENOL) 500 MG tablet Take 500 mg by mouth every 6 (six) hours as needed for headache (pain).   Yes [provider]  albuterol (PROAIR HFA) 108 (90 Base) MCG/ACT inhaler Inhale 2 puffs into the lungs every 4 (four) hours as needed for wheezing or shortness of breath. 06/09/18  Yes Valentina Shaggy, MD  albuterol (PROVENTIL) (2.5 MG/3ML) 0.083% nebulizer solution Take 2.5 mg by nebulization every 6 (six) hours as needed for wheezing or shortness of breath.   Yes [provider]  bisoprolol (ZEBETA) 5 MG tablet Take 5 mg by mouth every evening.    Yes [provider]  budesonide (PULMICORT) 0.25 MG/2ML nebulizer solution Take 2 mLs (0.25 mg total) by nebulization 2  (two) times daily. 05/30/18  Yes Valentina Shaggy, MD  budesonide-formoterol [facility]) 160-4.5 MCG/ACT inhaler Two puffs with spacer twice a day to prevent cough or wheeze.  Rinse, gargle and spit after use. Patient taking differently: Inhale 2 puffs into the lungs daily.  12/01/18  Yes Valentina Shaggy, MD  esomeprazole (NEXIUM) 40 MG capsule Take 40 mg by mouth daily at 12 noon.   Yes [provider]  fluticasone (FLONASE) 50 MCG/ACT nasal spray Place 1 spray into both nostrils daily as needed. Patient taking differently: Place 1 spray into both nostrils daily as needed for allergies.  05/30/18  Yes Valentina Shaggy, MD  loratadine (CLARITIN) 10 MG tablet Take 1 tablet (10 mg total) by mouth as needed. Patient taking differently: Take 10 mg by mouth daily as needed for allergies.  05/30/18  Yes Valentina Shaggy, MD  OXYGEN Inhale 2 L into the lungs at bedtime. DURING THE DAY IF NEEDED Reported on 11/25/2015   Yes [provider]  predniSONE (DELTASONE) 10 MG tablet Take 1 tablet (10 mg total) by mouth daily. 05/30/18  Yes Valentina Shaggy, MD  Vitamin D, Ergocalciferol, (DRISDOL) 50000 units CAPS capsule Take 50,000 Units by mouth every 14 (fourteen) days.    Yes [provider]  baclofen (LIORESAL) 10 MG tablet Take 10 mg by mouth daily as needed for muscle spasms.     [provider]  EPINEPHrine 0.3 mg/0.3 mL IJ SOAJ injection Inject 0.3 mLs (0.3 mg total) into the muscle once. Patient taking differently: Inject 0.3 mg into the muscle once as needed (severe allergic reactions).  02/24/16   Malvin Johns, MD  ipratropium-albuterol (DUONEB) 0.5-2.5 (3) MG/3ML SOLN One vial in nebulizer every 4-6 hours as needed for cough or wheeze. 10/31/18   Valentina Shaggy, MD  Menthol, Topical Analgesic, (  BIOFREEZE EX) Apply 1 application topically daily as needed (knee pain). TO THE LEFT KNEE AS NEEDED FOR PAIN     [provider]    nitroGLYCERIN (NITROSTAT) 0.4 MG SL tablet Place 1 tablet (0.4 mg total) under the tongue every 5 (five) minutes as needed for chest pain. 06/21/15   Brunetta Jeans, PA-C  ondansetron (ZOFRAN ODT) 8 MG disintegrating tablet Take 1 tablet (8 mg total) by mouth every 8 (eight) hours as needed for nausea or vomiting. 03/28/17   Molpus, John, MD    Physical Exam: Vitals:   12/04/18 1951 12/04/18 2159  BP: (!) 157/68 (!) 168/80  Pulse: 73 93  Resp: (!) 21 (!) 26  Temp: 97.8 F (36.6 C)   TempSrc: Oral   SpO2: 97% 100%    Constitutional: NAD, calm, comfortable Eyes: PERRL, lids and conjunctivae normal ENMT: Mucous membranes are moist. Posterior pharynx clear of any exudate or lesions.Normal dentition.  Neck: normal, supple, no masses, no thyromegaly Respiratory: Diffuse wheezing Cardiovascular: Regular rate and rhythm, no murmurs / rubs / gallops. No extremity edema. 2+ pedal pulses. No carotid bruits.  Abdomen: no tenderness, no masses palpated. No hepatosplenomegaly. Bowel sounds positive.  Musculoskeletal: no clubbing / cyanosis. No joint deformity upper and lower extremities. Good ROM, no contractures. Normal muscle tone.  Skin: no rashes, lesions, ulcers. No induration Neurologic: CN 2-12 grossly intact. Sensation intact, DTR normal. Strength 5/5 in all 4.  Psychiatric: Normal judgment and insight. Alert and oriented x 3. Normal mood.    Labs on Admission: I have personally reviewed following labs and imaging studies  CBC: Recent Labs  Lab 12/04/18 2038  WBC 7.9  HGB 14.3  HCT 44.9  MCV 99.1  PLT 235   Basic Metabolic Panel: Recent Labs  Lab 12/04/18 2038  NA 133*  K 4.1  CL 101  CO2 22  GLUCOSE 112*  BUN 10  CREATININE 0.97  CALCIUM 8.6*   GFR: CrCl cannot be calculated (Unknown ideal weight.). Liver Function Tests: Recent Labs  Lab 12/04/18 2038  AST 35  ALT 45*  ALKPHOS 50  BILITOT 0.8  PROT 7.3  ALBUMIN 4.2   No results for input(s): LIPASE,  AMYLASE in the last 168 hours. No results for input(s): AMMONIA in the last 168 hours. Coagulation Profile: No results for input(s): INR, PROTIME in the last 168 hours. Cardiac Enzymes: No results for input(s): CKTOTAL, CKMB, CKMBINDEX, TROPONINI in the last 168 hours. BNP (last 3 results) No results for input(s): PROBNP in the last 8760 hours. HbA1C: No results for input(s): HGBA1C in the last 72 hours. CBG: No results for input(s): GLUCAP in the last 168 hours. Lipid Profile: No results for input(s): CHOL, HDL, LDLCALC, TRIG, CHOLHDL, LDLDIRECT in the last 72 hours. Thyroid Function Tests: No results for input(s): TSH, T4TOTAL, FREET4, T3FREE, THYROIDAB in the last 72 hours. Anemia Panel: No results for input(s): VITAMINB12, FOLATE, FERRITIN, TIBC, IRON, RETICCTPCT in the last 72 hours. Urine analysis:    Component Value Date/Time   COLORURINE YELLOW 04/05/2017 [place] 04/05/2017 2303   LABSPEC 1.020 04/05/2017 2303   PHURINE 6.5 04/05/2017 2303   GLUCOSEU NEGATIVE 04/05/2017 2303   GLUCOSEU NEGATIVE 06/29/2014 1318   HGBUR NEGATIVE 04/05/2017 2303   BILIRUBINUR NEGATIVE 04/05/2017 2303   BILIRUBINUR neg 02/07/2015 1205   KETONESUR NEGATIVE 04/05/2017 2303   PROTEINUR NEGATIVE 04/05/2017 2303   UROBILINOGEN 0.2 02/07/2015 1205   UROBILINOGEN 0.2 02/03/2015 1940   NITRITE NEGATIVE 04/05/2017  [place] 04/05/2017 2303    Radiological Exams on Admission: Dg Chest Port 1 View  Result Date: 12/04/2018 CLINICAL DATA:  68 y/o  M; shortness of breath and cough. EXAM: PORTABLE CHEST 1 VIEW COMPARISON:  10/25/2017 chest radiograph FINDINGS: Stable heart size and mediastinal contours are within normal limits. Both lungs are clear. The visualized skeletal structures are unremarkable. IMPRESSION: No active disease. Electronically Signed   By: Kristine Garbe M.D.   On: 12/04/2018 20:21    EKG: Independently  reviewed.  Assessment/Plan Principal Problem:   COPD with acute exacerbation (HCC) Active Problems:   Alcohol abuse with alcohol-induced mood disorder (HCC)   Schizophrenia (HCC)   Diabetes mellitus without complication- (diet controlled; w/o proteinuria)    1. COPD exacerbation - 1. COPD pathway 2. PRN albuterol 3. Scheduled budesonide and LAMA 4. Azithromycin 5. Influenza PCR pending 6. Increase prednisone to 40mg  daily 7. Follow up with Dr. [place] Bells 2. H/o EtOH abuse - 1. CIWA 3. DM, diet controlled - 1. Sensitive SSI AC while on prednisone  DVT prophylaxis: Lovenox Code Status: Full Family Communication: Wife at bedside Disposition Plan: Home after admit Consults called: None Admission status: Place in 25     Ingvald Theisen, [place] [facility]  How to contact the [facility] Attending or Consulting provider [place] or covering provider during after hours [place], for this patient?  1. Check the care team in [facility] and look for a) attending/consulting [facility] provider listed and b) the [facility] team listed 2. Log into [email or web address]  Amion Physician Scheduling and messaging for groups and whole hospitals  On call and physician scheduling software for group practices, residents, hospitalists and other medical providers for call, clinic, rotation and shift schedules. On[employer] is a hospital-wide system for scheduling doctors and paging doctors on call. EasyPlot is for scientific plotting and data analysis.  [email or web address]  and use [place]'s universal password to access. If you do not have the password, please contact the hospital operator.  3. Locate the [facility] provider you are looking for under [facility] and page to a number that you can be directly reached. 4. If you still have difficulty reaching the provider, please page the [facility] (Director on Call) for the Hospitalists listed on amion for assistance.  12/04/2018, 10:53 PM

## 2018-12-04 NOTE — ED Notes (Signed)
ED TO INPATIENT HANDOFF REPORT  Name/Age/Gender [name] 68 y.o. male  Code Status    Code Status Orders  (From admission, onward)         Start     Ordered   12/04/18 2215  Full code  Continuous     12/04/18 2217        Code Status History    Date Active Date Inactive Code Status Order ID Comments User Context   10/16/2017 2320 10/17/2017 1624 Full Code [id number]  Milton Ferguson, MD ED   10/16/2017 2144 10/16/2017 2320 Full Code [id number]  Street, [place], [place] ED   11/04/2016 0406 11/06/2016 1718 Full Code [id number]  Norval Morton, MD Inpatient   07/21/2016 2337 07/23/2016 1856 Full Code [id number]  Means, Lolita Cram, MD Inpatient   06/26/2016 0249 06/26/2016 1725 Full Code [id number]  Norval Morton, MD ED   11/22/2015 0156 11/23/2015 1658 Full Code [id number]  Toy Baker, MD Inpatient   09/28/2014 1551 09/29/2014 1530 Full Code [id number]  [name], MD Inpatient   08/18/2014 1743 08/20/2014 1512 Full Code [id number]  Mendel Corning, MD Inpatient      Home/SNF/Other Home  Chief Complaint Shortness of Breath  Level of Care/Admitting Diagnosis ED Disposition    ED Disposition Condition [place]: [facility] [[id number]]  Level of Care: Med-Surg [16]  Diagnosis: COPD with acute exacerbation [facility]) [025427]  Admitting Physician: Etta Quill [phone]  Attending Physician: Etta Quill [[id number]]  PT Class (Do Not Modify): Observation [104]  PT Acc Code (Do Not Modify): Observation [10022]       Medical History Past Medical History:  Diagnosis Date  . Anxiety disorder   . Arthritis    BILATERAL SHOULDERS, ELBOWS AND HANDS AND LEFT HIP AND KNEES--HX CORTISONE SHOTS IN SHOULDERS, ELBOWS, HIP AND KNEES  . Basal cell carcinoma of back 04/30/2016   Dermatologist- Dr Nevada Crane;   MOHS sx- Dr Levada Dy   . Bladder outlet obstruction   . Chronic idiopathic constipation 05/08/2015  . Complication of anesthesia    DIFFICULT  WAKING   . COPD (chronic obstructive pulmonary disease) Gold C Frequent exacerbations 01/13/2014   [name] 07/08/14: FeV1 51% FeV1/FVC 66% FVC 59% 10/5/2015ONO RA was normal  10/13/2014  ONO on RA NORMAL   . COPD, frequent exacerbations ([place])    pulmologist-  dr Joya Gaskins--  [place] Rm Stage C.04-25-15 recent COPD exacerbation-much improved now, after tx. in ER [facility].  Marland Kitchen Depression   . Diabetes mellitus without complication ([place])    BODERLINE - DIET CONTROL  . Dysrhythmia    PVC'S  . Emphysema lung ([place])    stage 2  . Family history of adverse reaction to anesthesia    father would wake up with agitation   . Former smoker 01/13/2014  . Gastroesophageal reflux disease without esophagitis   . Heavy alcohol use 04/30/2016  . History of chronic bronchitis   . History of oxygen administration    oxygen use 2 l/m nasally at bedtime and exertional occasions  . History of rheumatic fever   . History of TB (tuberculosis)    1984--  hospitalized for 4 month treatment  . History of urinary retention   . Hx of multiple concussions    x 2 per patient   . Hypertension   . Melanoma ([place])   . Nocturnal oxygen desaturation    USES O2 NIGHTLY  . OSA (obstructive sleep apnea) 01/13/2014  .  PONV (postoperative nausea and vomiting)   . Post-polio syndrome    polio at age 61--PT WAS IN IRON LUNG; PT WAS IN W/C UNTIL AGE 54; STILL HAS WEAKNESS RIGHT SIDE  . Prostate cancer ([place])   . Schizophrenia ([place])   . Tuberculosis    Hosp 4 months rx , left early   . Urticaria     Allergies Allergies  Allergen Reactions  . Aspirin Anaphylaxis  . Hornet Venom Anaphylaxis  . Ivp Dye [Iodinated Diagnostic Agents] Anaphylaxis  . Levaquin [Levofloxacin In D5w] Shortness Of Breath and Swelling    In addition: sweating, chest pain, and diarrhea.   . Nsaids Anaphylaxis  . Penicillins Anaphylaxis    Heart stops Has patient had a PCN reaction causing immediate rash, facial/tongue/throat swelling, SOB or lightheadedness  with hypotension: yes Has patient had a PCN reaction causing severe rash involving mucus membranes or skin necrosis: NO Has patient had a PCN reaction that required hospitalization yes Has patient had a PCN reaction occurring within the last 10 years: No If all of the above answers are "NO", then may proceed with Cephalosporin use.   . Tolmetin Anaphylaxis  . Fasenra [Benralizumab] Hives  . Perforomist [Formoterol]     Increased wheezing, shortness of breath  . Buprenorphine Hcl Nausea And Vomiting    Can take with zofran   . Morphine And Related Nausea And Vomiting    Can take with zofran   . Oxycodone Itching and Rash    IV Location/Drains/Wounds Patient Lines/Drains/Airways Status   Active Line/Drains/Airways    Name:   Placement date:   Placement time:   Site:   Days:   Peripheral IV 12/04/18 Right;Anterior Forearm   12/04/18    2028    Forearm   less than 1          Labs/Imaging Results for orders placed or performed during the hospital encounter of 12/04/18 (from the past 48 hour(s))  CBC     Status: None   Collection Time: 12/04/18  8:38 PM  Result Value Ref Range   WBC 7.9 4.0 - 10.5 K/uL   RBC 4.53 4.22 - 5.81 MIL/uL   Hemoglobin 14.3 13.0 - 17.0 g/dL   HCT 44.9 39.0 - 52.0 %   MCV 99.1 80.0 - 100.0 fL   MCH 31.6 26.0 - 34.0 pg   MCHC 31.8 30.0 - 36.0 g/dL   RDW 12.4 11.5 - 15.5 %   Platelets 242 150 - 400 K/uL   nRBC 0.0 0.0 - 0.2 %    Comment: Performed at [facility], [place] [address], [place], [place] [address]  Comprehensive metabolic panel     Status: Abnormal   Collection Time: 12/04/18  8:38 PM  Result Value Ref Range   Sodium 133 (L) 135 - 145 mmol/L   Potassium 4.1 3.5 - 5.1 mmol/L   Chloride 101 98 - 111 mmol/L   CO2 22 22 - 32 mmol/L   Glucose, Bld 112 (H) 70 - 99 mg/dL   BUN 10 8 - 23 mg/dL   Creatinine, Ser 0.97 0.61 - 1.24 mg/dL   Calcium 8.6 (L) 8.9 - 10.3 mg/dL   Total Protein 7.3 6.5 - 8.1 g/dL   Albumin 4.2 3.5 - 5.0  g/dL   AST 35 15 - 41 U/L   ALT 45 (H) 0 - 44 U/L   Alkaline Phosphatase 50 38 - 126 U/L   Total Bilirubin 0.8 0.3 - 1.2 mg/dL   GFR calc  non Af Amer >60 >60 mL/min   GFR calc Af Amer >60 >60 mL/min   Anion gap 10 5 - 15    Comment: Performed at [facility], [place] [address] [place], [place] [address]  I-stat troponin, ED     Status: None   Collection Time: 12/04/18  8:42 PM  Result Value Ref Range   Troponin i, poc 0.02 0.00 - 0.08 ng/mL   Comment 3            Comment: Due to the release kinetics of cTnI, a negative result within the first hours of the onset of symptoms does not rule out myocardial infarction with certainty. If myocardial infarction is still suspected, repeat the test at appropriate intervals.    Dg Chest Port 1 View  Result Date: 12/04/2018 CLINICAL DATA:  68 y/o  M; shortness of breath and cough. EXAM: PORTABLE CHEST 1 VIEW COMPARISON:  10/25/2017 chest radiograph FINDINGS: Stable heart size and mediastinal contours are within normal limits. Both lungs are clear. The visualized skeletal structures are unremarkable. IMPRESSION: No active disease. Electronically Signed   By: Kristine Garbe M.D.   On: 12/04/2018 20:21   EKG Interpretation  Date/Time:  Thursday December 04 2018 20:11:02 EST Ventricular Rate:  70 PR Interval:    QRS Duration: 99 QT Interval:  401 QTC Calculation: 433 R Axis:   70 Text Interpretation:  Sinus rhythm No significant change since last tracing Confirmed by Lajean Saver [phone]) on 12/04/2018 9:27:53 PM   Pending Labs Unresulted Labs (From admission, onward)    Start     Ordered   12/04/18 2214  HIV antibody (Routine Testing)  Once,   R     12/04/18 2217   12/04/18 2140  Influenza panel by PCR (type A & B)  (Influenza PCR Panel)  Once,   R     12/04/18 2139          Vitals/Pain Today's Vitals   12/04/18 1951 12/04/18 2100 12/04/18 2130 12/04/18 2159  BP: (!) 157/68 (!) 158/76 (!) 168/80 (!)  168/80  Pulse: 73 75 78 93  Resp: (!) 21 12 20  (!) 26  Temp: 97.8 F (36.6 C)     TempSrc: Oral     SpO2: 97% 100% 100% 100%  PainSc:        Isolation Precautions Droplet precaution  Medications Medications  albuterol (PROVENTIL,VENTOLIN) solution continuous neb (10 mg/hr Nebulization New Bag/Given 12/04/18 2159)  magnesium sulfate IVPB 2 g 50 mL (has no administration in time range)  baclofen (LIORESAL) tablet 10 mg (has no administration in time range)  bisoprolol (ZEBETA) tablet 5 mg (has no administration in time range)  budesonide (PULMICORT) nebulizer solution 0.25 mg (has no administration in time range)  pantoprazole (PROTONIX) EC tablet 80 mg (has no administration in time range)  ondansetron (ZOFRAN-ODT) disintegrating tablet 8 mg (has no administration in time range)  enoxaparin (LOVENOX) injection 40 mg (has no administration in time range)  azithromycin (ZITHROMAX) 500 mg in sodium chloride 0.9 % 250 mL IVPB (has no administration in time range)    Followed by  azithromycin (ZITHROMAX) tablet 500 mg (has no administration in time range)  umeclidinium bromide (INCRUSE ELLIPTA) 62.5 MCG/INH 1 puff (has no administration in time range)  predniSONE (DELTASONE) tablet 40 mg (has no administration in time range)  albuterol (PROVENTIL) (2.5 MG/3ML) 0.083% nebulizer solution 2.5 mg (has no administration in time range)  LORazepam (ATIVAN) tablet 1 mg (has no administration in time  range)    Or  LORazepam (ATIVAN) injection 1 mg (has no administration in time range)  thiamine (VITAMIN B-1) tablet 100 mg (has no administration in time range)    Or  thiamine (B-1) injection 100 mg (has no administration in time range)  folic acid (FOLVITE) tablet 1 mg (has no administration in time range)  multivitamin with minerals tablet 1 tablet (has no administration in time range)  insulin aspart (novoLOG) injection 0-9 Units (has no administration in time range)  albuterol (PROVENTIL)  (2.5 MG/3ML) 0.083% nebulizer solution 5 mg (5 mg Nebulization Given 12/04/18 2027)  ipratropium (ATROVENT) nebulizer solution 0.5 mg (0.5 mg Nebulization Given 12/04/18 2027)  methylPREDNISolone sodium succinate (SOLU-MEDROL) 125 mg/2 mL injection 125 mg (125 mg Intravenous Given 12/04/18 2033)  albuterol (PROVENTIL) (2.5 MG/3ML) 0.083% nebulizer solution 5 mg (5 mg Nebulization Given 12/04/18 2105)  ipratropium (ATROVENT) nebulizer solution 0.5 mg (0.5 mg Nebulization Given 12/04/18 2105)    Mobility walks

## 2018-12-04 NOTE — ED Triage Notes (Addendum)
Pt arrives by POV due to shortness of breath. Pt had a cold last week and has been taking his home medications such  as prednisone, albuterol, neb treatments, and his inhalers with no relief. Pt is in no distress at this time. Pt has hx of asthma and COPD.

## 2018-12-05 ENCOUNTER — Encounter: Payer: Self-pay | Admitting: Family Medicine

## 2018-12-05 DIAGNOSIS — J441 Chronic obstructive pulmonary disease with (acute) exacerbation: Secondary | ICD-10-CM | POA: Diagnosis not present

## 2018-12-05 DIAGNOSIS — E119 Type 2 diabetes mellitus without complications: Secondary | ICD-10-CM

## 2018-12-05 LAB — INFLUENZA PANEL BY PCR (TYPE A & B)
Influenza A By PCR: NEGATIVE
Influenza B By PCR: NEGATIVE

## 2018-12-05 LAB — GLUCOSE, CAPILLARY
Glucose-Capillary: 189 mg/dL — ABNORMAL HIGH (ref 70–99)
Glucose-Capillary: 214 mg/dL — ABNORMAL HIGH (ref 70–99)

## 2018-12-05 LAB — HIV ANTIBODY (ROUTINE TESTING W REFLEX): HIV Screen 4th Generation wRfx: NONREACTIVE

## 2018-12-05 MED ORDER — ALBUTEROL SULFATE (2.5 MG/3ML) 0.083% IN NEBU: 2.5000 mg | INHALATION_SOLUTION | Freq: Three times a day (TID) | RESPIRATORY_TRACT | Status: DC

## 2018-12-05 MED ORDER — ALBUTEROL SULFATE (2.5 MG/3ML) 0.083% IN NEBU
2.5000 mg | INHALATION_SOLUTION | Freq: Four times a day (QID) | RESPIRATORY_TRACT | Status: DC
  Administered 2018-12-05 (×2): 2.5 mg via RESPIRATORY_TRACT
  Filled 2018-12-05 (×2): qty 3

## 2018-12-05 MED ORDER — AZITHROMYCIN 250 MG PO TABS: 250.0000 mg | ORAL_TABLET | Freq: Every day | ORAL | 0 refills | Status: DC

## 2018-12-05 MED ORDER — PREDNISONE 10 MG PO TABS: ORAL_TABLET | ORAL | 0 refills | Status: AC

## 2018-12-05 NOTE — Care Management (Signed)
This CM spoke with pt about COPD and DC planning. Pt has 02 at night at home currently from [facility]. Pt interested in a [facility] and [facility] when offered choice. Alvis Lemmings rep given referral. Marney Doctor RN,BSN [phone]

## 2018-12-05 NOTE — Evaluation (Signed)
Physical Therapy Evaluation Patient Details Name: [name] MRN: [id number] DOB: 1951/06/17 Today's Date: 12/05/2018   History of Present Illness  68 yo male admitted with COPD exacerbation. PMH: anxiety, OA, depression, DM, emphysema, history of smoking, heavy alcohol use, 2LO2 at night and with heavy exertion, concussion x2, HTN, OA, post-polio, schizophrenia.   Clinical Impression   Pt presents with dyspnea on exertion without desats, RLE weakness secondary to post-polio, dynamic balance impairments evidenced by DGI, lack of safety awareness/awareness of deficits, impaired health literacy in regards to pt's care of cardiac and pulmonary history, and decreased activity tolerance. Pt to benefit from acute PT to address deficits. Pt ambulated 400 ft in hallway, requiring intermittent standing rest breaks to recover sats. Pt is a fall risk per DGI score assessed on eval, and would benefit from OPPT to address deficits. Pt states to PT "I feel like I am at an age where I am just going to go downhill". Pt educated on the value of PT to improve balance, strength, and endurance, pt and wife understand and agree. Pt also encouraged to address angina in OP setting and to weigh daily to assess for weight fluctuations, to practice pursed lip breathing and frequent rests to manage COPD.  PT to progress mobility as tolerated, and will continue to follow acutely.      Follow Up Recommendations Outpatient PT;Supervision for mobility/OOB    Equipment Recommendations  None recommended by PT    Recommendations for Other Services       Precautions / Restrictions Precautions Precautions: Fall Restrictions Weight Bearing Restrictions: No      Mobility  Bed Mobility Overal bed mobility: Modified Independent             General bed mobility comments: increased time   Transfers Overall transfer level: Modified independent Equipment used: None             General transfer comment:  increased time   Ambulation/Gait Ambulation/Gait assistance: Min guard;Supervision Gait Distance (Feet): 400 Feet Assistive device: None Gait Pattern/deviations: Step-through pattern;Wide base of support;Decreased stride length Gait velocity: slightly decr    General Gait Details: wide BOS and outtoeing bilaterally, some foot slap noted R>L. Difficult to assess with high ankle stability hiking boots on. Pt supervision assist for ambulation without challenges, min guard for safety when challenging gait with DGI. Pt with dyspnea 2-3/4 with exertion, Sats >94% on RA throughout ambulation. 3 standing rest breaks required to recover dyspnea. HR up to 110 bpm.   Stairs Stairs: Yes Stairs assistance: Min guard Stair Management: One rail Right;Step to pattern;Alternating pattern Number of Stairs: 10 General stair comments: alternating pattern with ascending, step-to pattern with descending. Increased time to perform, difficulty with descent due to lack of eccentric quad control.   Wheelchair Mobility    Modified Rankin (Stroke Patients Only)       Balance Overall balance assessment: Needs assistance Sitting-balance support: Feet supported Sitting balance-Leahy Scale: Good     Standing balance support: No upper extremity supported Standing balance-Leahy Scale: Fair Standing balance comment: difficulty accepting challenge to gait                  Standardized Balance Assessment Standardized Balance Assessment : Dynamic Gait Index   Dynamic Gait Index Level Surface: Mild Impairment Change in Gait Speed: Mild Impairment Gait with Horizontal Head Turns: Mild Impairment Gait with Vertical Head Turns: Mild Impairment Gait and Pivot Turn: Mild Impairment Step Over Obstacle: Normal Step Around Obstacles: Normal Steps:  Moderate Impairment Total Score: 17       Pertinent Vitals/Pain Pain Assessment: No/denies pain    Home Living Family/patient expects to be discharged to::  Private residence Living Arrangements: Spouse/significant other Available Help at Discharge: Family Type of Home: House Home Access: Stairs to enter Entrance Stairs-Rails: Chemical engineer of Steps: 1(from garage) Home Layout: Two level;Laundry or work area in [employer]: Other (comment)(oxygen, used at night and with exertion ) Additional Comments: patient heats his home with wood burning fire. pt has a man cave in the basement. pt loads the wood up onto truck instead of having it delivered to garage.     Prior Function Level of Independence: Independent         Comments: Per OT, pt and RN, pt with trouble sleeping at baseline suspect due to anxiety. Pt and pt's wife at bedside state that pt intermittently needs ADs (cane, RW), and borrows them from mother. Pt states he has had 2 falls that resulted in concussions, and they were accidents while working on home projects. Pt states he gets off balance when he is "tipsy"      Hand Dominance   Dominant Hand: Right    Extremity/Trunk Assessment   Upper Extremity Assessment Upper Extremity Assessment: Defer to OT evaluation    Lower Extremity Assessment Lower Extremity Assessment: RLE deficits/detail;Overall [facility] for tasks assessed RLE Deficits / Details: Pt with 2 inch LLD on RLE per pt, and does not wear appropriate height shoe to accomodate this. Pt with 3/5 strength in hip abductors/adductors & knee flexors, 2+/5 knee extension, and noticable contraction only in PF/DF RLE Sensation: WNL    Cervical / Trunk Assessment Cervical / Trunk Assessment: Normal  Communication   Communication: No difficulties  Cognition Arousal/Alertness: Awake/alert Behavior During Therapy: Restless Overall Cognitive Status: Impaired/Different from baseline Area of Impairment: Safety/judgement;Memory;Attention;Problem solving                   Current Attention Level: Selective Memory: Decreased short-term  memory   Safety/Judgement: Decreased awareness of safety;Decreased awareness of deficits Awareness: Emergent Problem Solving: Requires tactile cues;Requires verbal cues General Comments: Pt very tangential with discussions with PT, difficult to redirect. Pt with difficulty focusing on task at hand, as PT asked pt to put on shoes and he picked a shoe up, started untieing it, then put it back down and required cue to restart shoe donning. Pt states "sometimes I have bad chest pain, but I just wait for it to go away".       General Comments      Exercises     Assessment/Plan    PT Assessment Patient needs continued PT services  PT Problem List Decreased strength;Cardiopulmonary status limiting activity;Decreased activity tolerance;Decreased balance;Decreased mobility;Decreased safety awareness       PT Treatment Interventions Therapeutic activities;Gait training;Therapeutic exercise;Stair training;Balance training;Functional mobility training;Patient/family education    PT Goals (Current goals can be found in the Care Plan section)  Acute Rehab PT Goals Patient Stated Goal: to go home  PT Goal Formulation: With patient Time For Goal Achievement: 12/19/18 Potential to Achieve Goals: Good    Frequency Min 3X/week   Barriers to discharge        Co-evaluation               AM-PAC PT "6 Clicks" Mobility  Outcome Measure Help needed turning from your back to your side while in a flat bed without using bedrails?: None Help needed moving from lying  on your back to sitting on the side of a flat bed without using bedrails?: None Help needed moving to and from a bed to a chair (including a wheelchair)?: None Help needed standing up from a chair using your arms (e.g., wheelchair or bedside chair)?: None Help needed to walk in hospital room?: A Little Help needed climbing 3-5 steps with a railing? : A Little 6 Click Score: 22    End of Session   Activity Tolerance: Patient  tolerated treatment well;Patient limited by fatigue Patient left: in bed;with call bell/phone within reach;with family/visitor present Nurse Communication: Mobility status PT Visit Diagnosis: Other abnormalities of gait and mobility (R26.89);Difficulty in walking, not elsewhere classified (R26.2);Muscle weakness (generalized) (M62.81)    Time: 8270-7867 PT Time Calculation (min) (ACUTE ONLY): 26 min   Charges:   PT Evaluation $PT Eval Low Complexity: 1 Low PT Treatments $Gait Training: 8-22 mins        Julien Girt, PT Acute Rehabilitation Services Pager [phone]  Office [phone]   Roxine Caddy D Elonda Husky 12/05/2018, 12:59 PM

## 2018-12-05 NOTE — Telephone Encounter (Signed)
Patient went to [facility] last night and she stated that it was a possible viral infection but was given abx just incase. Patient was release today and is in stable condition, due to the treatment [facility] provided to the patient.

## 2018-12-05 NOTE — Telephone Encounter (Signed)
We have attempted to contact pt several times with no success or call back from pt. Per triage protocol, message will be closed.  

## 2018-12-05 NOTE — Progress Notes (Signed)
Initial Nutrition Assessment  DOCUMENTATION CODES:   Obesity unspecified  INTERVENTION:   Encourage good PO intake of nutrient-dense foods in spite of poor appetite   NUTRITION DIAGNOSIS:   Limited adherence to nutrition-related recommendations related to social / environmental circumstances, decreased appetite as evidenced by estimated needs, per patient/family report, energy intake < 75% for > or equal to 3 months.   GOAL:   Patient will meet greater than or equal to 90% of their needs   MONITOR:   PO intake, Labs, Weight trends  REASON FOR ASSESSMENT:   Consult Assessment of nutrition requirement/status  ASSESSMENT:   68 yo male, admitted with COPD exacerbation. Presents with worsening SOB x 2 weeks. PMH significant for COPD, O2 dependent, chronic idiopathic constipation, anxiety, depression, DM, former smoker, GERD, heavy alcohol use, h/o urinary retention, HTN.  Labs: Sodium 133 (L), glucose 112 mg/dL, ALT 45 (H) Meds: Folvite, Novolog, MVI with minerals, Protonix EC, thiamine  Pt alert and pleasant, sitting up in bed at time of visit. Wife present.  Pt reports poor appetite x 6 months and suspects this change is d/t anxiety and personal stress. States he eats ~1 meal daily, frequently snacks on candy. Pt is particular about what he eats and has trouble making decisions about food. Discussed the benefits of meal planning and shopping with a grocery list.   Endorses ongoing issues with chronic constipation. Discussed the importance of hydration (with water, not soda/beer) and fiber (whole grains, fruits, vegetables) to ease the constipation, and how adherence over time is necessary to see results.   Discussed avoiding fad diets and finding something sustainable. Recommended DASH or Mediterranean-style diet options that are nutrient-dense and offer variety. Discussed the importance of portion control and increased awareness of food choices. Pt reports having received  diet education for consistent carbohydrate diet. Discussed difficulty of adhering to recommended portion sizes. Pt expressed interest in seeing an outpatient dietitian.  Encouraged pt to include protein-rich foods with all meals and snacks, and to eat those foods first if not feeling hungry. Pt declines supplements at this time.   NUTRITION - FOCUSED PHYSICAL EXAM: Deferred - patient appears adequately nourished, will need NFPE if present for follow-up visit  Diet Order:  No known food allergies Diet Order            Diet - low sodium heart healthy        Diet Carb Modified Fluid consistency: Thin; Room service appropriate? Yes  Diet effective now             No meals recorded  EDUCATION NEEDS:  Education needs have been addressed  Skin:  Skin Assessment: Reviewed RN Assessment  Last BM:  2/19  Height:  Ht Readings from Last 1 Encounters:  12/05/18 6\' 1"  (1.854 m)    Weight:  Wt Readings from Last 10 Encounters:  12/05/18 104.5 kg  10/30/18 103.9 kg  07/29/18 104.8 kg  07/25/18 105.2 kg  01/08/18 104.3 kg  Wt stable x 1 year   Ideal Body Weight:  83.6 kg  BMI:  Body mass index is 30.4 kg/m. obese  Estimated Nutritional Needs:   Kcal:  1470-1890 (14-18 kcal/kg)  Protein:  84-109 gm (1.0-1.3 g/kg IBW)  Fluid:  1 mL/kcal or per MD   Althea Grimmer, MS, RDN, LDN Pager: [phone] Available Mondays and Fridays, 9am-2pm

## 2018-12-05 NOTE — Evaluation (Signed)
Occupational Therapy Evaluation Patient Details Name: [name] MRN: [id number] DOB: [birth year] Today's Date: 12/05/2018    History of Present Illness 68 yo male admitted with COPD exacerbation PMH:  Anxiety disorder, Arthritis, Basal cell carcinoma of back (04/30/2016), Bladder outlet obstruction,COPD Gold C Frequent exacerbations (01/13/2014), COPD, frequent exacerbations  Depression, Diabetes mellitus, Dysrhythmia, Emphysema lung, Heavy alcohol use, TB (tuberculosis),  multiple concussions, Hypertension, Melanoma Nocturnal oxygen desaturation, OSA (obstructive sleep apnea) (, PONV  Post-polio syndrome, Prostate cancer, Schizophrenia, Tuberculosis, and Urticaria.    Clinical Impression   Patient evaluated by Occupational Therapy with no further acute OT needs identified. All education has been completed and the patient has no further questions. See below for any follow-up Occupational Therapy or equipment needs. OT to sign off. Thank you for referral.      Follow Up Recommendations  No OT follow up    Equipment Recommendations  None recommended by OT    Recommendations for Other Services       Precautions / Restrictions Precautions Precautions: Fall      Mobility Bed Mobility Overal bed mobility: Independent                Transfers Overall transfer level: Independent                    Balance                                           ADL either performed or assessed with clinical judgement   ADL Overall ADL's : Modified independent                                       General ADL Comments: pt dressed on arrival. pt is able to complete a figure 4 cross in sitting. pt educate don energy conservation for showering/ dressing/ home task and espeically the use of wood gathering. pt expressed feeling like he is gaining weight. pt advised to weight himself everyday from now until follow up appointment at the same time in  the same way and chart. wife reports that is a great idea. pt reports he feels like he is growing from inside out and extended. pt asking about return to cardiac rehab and wanting to ride a bike with his wife this summer. pt tangental at times during session and needs redirection to focus on needs vs wants for recovery. pt present for all education and receptive.      Vision Baseline Vision/History: Wears glasses Wears Glasses: At all times       Perception     Praxis      Pertinent Vitals/Pain Pain Assessment: No/denies pain     Hand Dominance Right   Extremity/Trunk Assessment Upper Extremity Assessment Upper Extremity Assessment: Overall WFL for tasks assessed   Lower Extremity Assessment Lower Extremity Assessment: Overall WFL for tasks assessed       Communication Communication Communication: No difficulties   Cognition Arousal/Alertness: Awake/alert Behavior During Therapy: Restless Overall Cognitive Status: Impaired/Different from baseline Area of Impairment: Awareness;Safety/judgement                         Safety/Judgement: Decreased awareness of safety;Decreased awareness of deficits Awareness: Emergent   General Comments: pt reports wanting to get  on the roof next week but reports he is dizzy this session. pt advised against gettin g on the roof. the reason for wanting to get on the roof is to put up a different antenna for tv service    General Comments       Exercises     Shoulder Instructions      Home Living Family/patient expects to be discharged to:: Private residence Living Arrangements: Spouse/significant other Available Help at Discharge: Family Type of Home: House Home Access: Stairs to enter [employer] of Steps: 1(from garage) Entrance Stairs-Rails: Left;Right Home Layout: One level     Bathroom Shower/Tub: Teacher, early years/pre: Standard Bathroom Accessibility: Yes   Home Equipment: Other  (comment)(oxygen)   Additional Comments: in a rental home with mold in one of the bathrooms/ patient heats his home with wood burning fire. pt has a man cave in the basement. pt loads the wood up onto truck instead of having it delivered to garage      Prior Functioning/Environment Level of Independence: Independent        Comments: reports only sleeping 3 hours at a time and up at all hours. pt reports laying down he feels useless and has inabilty to rest due to mind constantly thinking of things that he should be doing. pt reports this admission the ativan that he was given allowed him the first 8 hours of sleep in years and he wants to learn more about how to do that more often         OT Problem List:        OT Treatment/Interventions:      OT Goals(Current goals can be found in the care plan section) Acute Rehab OT Goals Patient Stated Goal: to go home and move wood OT Goal Formulation: With patient/family  OT Frequency:     Barriers to D/C:            Co-evaluation              AM-PAC OT "6 Clicks" Daily Activity     Outcome Measure Help from another person eating meals?: None Help from another person taking care of personal grooming?: None Help from another person toileting, which includes using toliet, bedpan, or urinal?: None Help from another person bathing (including washing, rinsing, drying)?: None Help from another person to put on and taking off regular upper body clothing?: None Help from another person to put on and taking off regular lower body clothing?: None 6 Click Score: 24   End of Session Nurse Communication: Mobility status;Precautions  Activity Tolerance: Patient tolerated treatment well Patient left: in bed;with call bell/phone within reach;with family/visitor present  OT Visit Diagnosis: Unsteadiness on feet (R26.81)                Time: 1030-1057 OT Time Calculation (min): 27 min Charges:  OT General Charges $OT Visit: 1 Visit OT  Evaluation $OT Eval Moderate Complexity: 1 Mod OT Treatments $Self Care/Home Management : 8-22 mins   Jeri Modena, OTR/L  Acute Rehabilitation Services Pager: [phone] Office: [phone] .   Jeri Modena 12/05/2018, 11:32 AM

## 2018-12-05 NOTE — Discharge Summary (Signed)
Physician Discharge Summary  [name] BTD:[id number] DOB: 23-Aug-1951 DOA: 12/04/2018  PCP: Levin Bacon, NP  Admit date: 12/04/2018 Discharge date: 12/05/2018  Admitted From: Home Disposition: Home   Recommendations for Outpatient Follow-up:  1. Follow up with PCP in 1-2 weeks 2. Follow up with pulmonology and asthma/immunology ASAP  Home Health: HH-RN, outpatient PT Equipment/Devices: None new, continue nocturnal 2L O2 Discharge Condition: Stable CODE STATUS: Full Diet recommendation: Carb-modified  Brief/Interim Summary: [name] is a 68 y.o. male with a history of COPD/asthma on nocturnal O2 and chronic prednisone who presented to the ED with a couple weeks of progressive dyspnea and cough with associated URI symptoms. IV steroids, magnesium, and breathing treatments given with some improvement. Hospitalist service asked to observe for further improvement prior to discharge. On 2/21 he is feeling considerably better, no longer wheezing, and feels stable for discharge home.  Discharge Diagnoses:  Principal Problem:   COPD with acute exacerbation ([place]) Active Problems:   Alcohol abuse with alcohol-induced mood disorder ([facility])   Schizophrenia ([place])   Diabetes mellitus without complication- (diet controlled; w/o proteinuria)  COPD/asthma with acute exacerbation: Flu negative. No focal infiltrate on CXR. - Continue home respiratory treatments both scheduled and prn - Continue steroids, taper prednisone 40mg  >> 10mg  baseline over the course of 9 days. - complete 5 days azithromycin.  - Note limited pulmonology follow up, encouraged him to schedule appt ASAP  Diet-controlled T2DM: Continue carb-modified diet. Dietitian evaluated the patient while in the hospital  History of EtOH abuse: No evidence of withdrawal while hospitalized.  HTN:  - Continue bisoprolol  Chronic hypoxic respiratory failure on nocturnal O2: No acute hypoxia.  Obesity:  - Dietitian  consulted  Discharge Instructions Discharge Instructions    Ambulatory referral to Physical Therapy   Complete by:  As directed    Iontophoresis - 4 mg/ml of dexamethasone:  No   T.E.N.S. Unit Evaluation and Dispense as Indicated:  No   Diet - low sodium heart healthy   Complete by:  As directed    Discharge instructions   Complete by:  As directed    - Take azithromycin daily for the next 4 days - Take prednisone tapered from 40mg  back down to home 10mg  dose (down by 10mg  every 3 days) as directed.  - Follow up with PCP as scheduled and call Dr. Anastasia Pall office to schedule a sooner follow up appointment - Follow up with your allergy provider for further management as well.  - If your symptoms worsen, seek medical attention right away.   Increase activity slowly   Complete by:  As directed      Allergies as of 12/05/2018      Reactions   Aspirin Anaphylaxis   Hornet Venom Anaphylaxis   Ivp Dye [iodinated Diagnostic Agents] Anaphylaxis   Levaquin [levofloxacin In D5w] Shortness Of Breath, Swelling   In addition: sweating, chest pain, and diarrhea.    Nsaids Anaphylaxis   Penicillins Anaphylaxis   Heart stops Has patient had a PCN reaction causing immediate rash, facial/tongue/throat swelling, SOB or lightheadedness with hypotension: yes Has patient had a PCN reaction causing severe rash involving mucus membranes or skin necrosis: NO Has patient had a PCN reaction that required hospitalization yes Has patient had a PCN reaction occurring within the last 10 years: No If all of the above answers are "NO", then may proceed with Cephalosporin use.   Tolmetin Anaphylaxis   Fasenra [benralizumab] Hives   Perforomist [formoterol]    Increased  wheezing, shortness of breath   Buprenorphine Hcl Nausea And Vomiting   Can take with zofran    Morphine And Related Nausea And Vomiting   Can take with zofran    Oxycodone Itching, Rash      Medication List    TAKE these medications    acetaminophen 500 MG tablet Commonly known as:  TYLENOL Take 500 mg by mouth every 6 (six) hours as needed for headache (pain).   albuterol (2.5 MG/3ML) 0.083% nebulizer solution Commonly known as:  PROVENTIL Take 2.5 mg by nebulization every 6 (six) hours as needed for wheezing or shortness of breath.   albuterol 108 (90 Base) MCG/ACT inhaler Commonly known as:  PROAIR HFA Inhale 2 puffs into the lungs every 4 (four) hours as needed for wheezing or shortness of breath.   azithromycin 250 MG tablet Commonly known as:  ZITHROMAX Take 1 tablet (250 mg total) by mouth daily.   baclofen 10 MG tablet Commonly known as:  LIORESAL Take 10 mg by mouth daily as needed for muscle spasms.   BIOFREEZE EX Apply 1 application topically daily as needed (knee pain). TO THE LEFT KNEE AS NEEDED FOR PAIN   bisoprolol 5 MG tablet Commonly known as:  ZEBETA Take 5 mg by mouth every evening.   budesonide 0.25 MG/2ML nebulizer solution Commonly known as:  PULMICORT Take 2 mLs (0.25 mg total) by nebulization 2 (two) times daily.   budesonide-formoterol 160-4.5 MCG/ACT inhaler Commonly known as:  SYMBICORT Two puffs with spacer twice a day to prevent cough or wheeze.  Rinse, gargle and spit after use. What changed:    how much to take  how to take this  when to take this  additional instructions   EPINEPHrine 0.3 mg/0.3 mL Soaj injection Commonly known as:  EPI-PEN Inject 0.3 mLs (0.3 mg total) into the muscle once. What changed:    when to take this  reasons to take this   esomeprazole 40 MG capsule Commonly known as:  NEXIUM Take 40 mg by mouth daily at 12 noon.   fluticasone 50 MCG/ACT nasal spray Commonly known as:  FLONASE Place 1 spray into both nostrils daily as needed. What changed:  reasons to take this   ipratropium-albuterol 0.5-2.5 (3) MG/3ML Soln Commonly known as:  DUONEB One vial in nebulizer every 4-6 hours as needed for cough or wheeze.   loratadine 10 MG  tablet Commonly known as:  CLARITIN Take 1 tablet (10 mg total) by mouth as needed. What changed:    when to take this  reasons to take this   nitroGLYCERIN 0.4 MG SL tablet Commonly known as:  NITROSTAT Place 1 tablet (0.4 mg total) under the tongue every 5 (five) minutes as needed for chest pain.   ondansetron 8 MG disintegrating tablet Commonly known as:  ZOFRAN ODT Take 1 tablet (8 mg total) by mouth every 8 (eight) hours as needed for nausea or vomiting.   OXYGEN Inhale 2 L into the lungs at bedtime. DURING THE DAY IF NEEDED Reported on 11/25/2015   predniSONE 10 MG tablet Commonly known as:  DELTASONE Take 4 tablets (40 mg total) by mouth daily for 3 days, THEN 3 tablets (30 mg total) daily for 3 days, THEN 2 tablets (20 mg total) daily for 3 days, THEN 1 tablet (10 mg total) daily for 3 days. Start taking on:  December 05, 2018 What changed:  See the new instructions.   Vitamin D (Ergocalciferol) 1.25 MG (50000 UT) Caps capsule  Commonly known as:  DRISDOL Take 50,000 Units by mouth every 14 (fourteen) days.      Follow-up Information    Levin Bacon, NP Follow up.   Specialty:  Family Medicine Contact information: [address] [place] [address] [phone]        Juanito Doom, MD. Schedule an appointment as soon as possible for a visit.   Specialty:  Pulmonary Disease Contact information: [place] [address] [phone]        [facility] Follow up.   Specialty:  Home Health Services Why:  for home health nurse Contact information: [address] [place] [address] [phone]          Allergies  Allergen Reactions  . Aspirin Anaphylaxis  . Hornet Venom Anaphylaxis  . Ivp Dye [Iodinated Diagnostic Agents] Anaphylaxis  . Levaquin [Levofloxacin In D5w] Shortness Of Breath and Swelling    In addition: sweating, chest pain, and diarrhea.   . Nsaids Anaphylaxis  .  Penicillins Anaphylaxis    Heart stops Has patient had a PCN reaction causing immediate rash, facial/tongue/throat swelling, SOB or lightheadedness with hypotension: yes Has patient had a PCN reaction causing severe rash involving mucus membranes or skin necrosis: NO Has patient had a PCN reaction that required hospitalization yes Has patient had a PCN reaction occurring within the last 10 years: No If all of the above answers are "NO", then may proceed with Cephalosporin use.   . Tolmetin Anaphylaxis  . Fasenra [Benralizumab] Hives  . Perforomist [Formoterol]     Increased wheezing, shortness of breath  . Buprenorphine Hcl Nausea And Vomiting    Can take with zofran   . Morphine And Related Nausea And Vomiting    Can take with zofran   . Oxycodone Itching and Rash    Consultations:  None  Procedures/Studies: Dg Chest Port 1 View  Result Date: 12/04/2018 CLINICAL DATA:  68 y/o  M; shortness of breath and cough. EXAM: PORTABLE CHEST 1 VIEW COMPARISON:  10/25/2017 chest radiograph FINDINGS: Stable heart size and mediastinal contours are within normal limits. Both lungs are clear. The visualized skeletal structures are unremarkable. IMPRESSION: No active disease. Electronically Signed   By: Kristine Garbe M.D.   On: 12/04/2018 20:21    Subjective: Feels he's breathing near baseline, no fever, chest pain, palpitations. Wants to go home.   Discharge Exam: Vitals:   12/05/18 0642 12/05/18 0728  BP: 127/65   Pulse: (!) 51 (!) 102  Resp: 18 17  Temp: 98.9 F (37.2 C)   SpO2: 96% 94%   General: Pt is alert, awake, not in acute distress Cardiovascular: RRR, S1/S2 +, no rubs, no gallops Respiratory: Nonlabored on room air with no wheezing. Abdominal: Soft, NT, ND, bowel sounds + Extremities: No edema, no cyanosis  Labs: BNP (last 3 results) No results for input(s): BNP in the last 8760 hours. Basic Metabolic Panel: Recent Labs  Lab 12/04/18 2038  NA 133*  K 4.1   CL 101  CO2 22  GLUCOSE 112*  BUN 10  CREATININE 0.97  CALCIUM 8.6*   Liver Function Tests: Recent Labs  Lab 12/04/18 2038  AST 35  ALT 45*  ALKPHOS 50  BILITOT 0.8  PROT 7.3  ALBUMIN 4.2   No results for input(s): LIPASE, AMYLASE in the last 168 hours. No results for input(s): AMMONIA in the last 168 hours. CBC: Recent Labs  Lab 12/04/18 2038  WBC  7.9  HGB 14.3  HCT 44.9  MCV 99.1  PLT 242   Cardiac Enzymes: No results for input(s): CKTOTAL, CKMB, CKMBINDEX, TROPONINI in the last 168 hours. BNP: Invalid input(s): POCBNP CBG: Recent Labs  Lab 12/04/18 2357 12/05/18 0746 12/05/18 1203  GLUCAP 211* 189* 214*   D-Dimer No results for input(s): DDIMER in the last 72 hours. Hgb A1c No results for input(s): HGBA1C in the last 72 hours. Lipid Profile No results for input(s): CHOL, HDL, LDLCALC, TRIG, CHOLHDL, LDLDIRECT in the last 72 hours. Thyroid function studies No results for input(s): TSH, T4TOTAL, T3FREE, THYROIDAB in the last 72 hours.  Invalid input(s): FREET3 Anemia work up No results for input(s): VITAMINB12, FOLATE, FERRITIN, TIBC, IRON, RETICCTPCT in the last 72 hours. Urinalysis    Component Value Date/Time   COLORURINE YELLOW 04/05/2017 2303   APPEARANCEUR CLEAR 04/05/2017 2303   LABSPEC 1.020 04/05/2017 2303   PHURINE 6.5 04/05/2017 2303   GLUCOSEU NEGATIVE 04/05/2017 2303   GLUCOSEU NEGATIVE 06/29/2014 1318   HGBUR NEGATIVE 04/05/2017 2303   BILIRUBINUR NEGATIVE 04/05/2017 2303   BILIRUBINUR neg 02/07/2015 1205   KETONESUR NEGATIVE 04/05/2017 2303   PROTEINUR NEGATIVE 04/05/2017 2303   UROBILINOGEN 0.2 02/07/2015 1205   UROBILINOGEN 0.2 02/03/2015 1940   NITRITE NEGATIVE 04/05/2017 2303   LEUKOCYTESUR NEGATIVE 04/05/2017 2303    Microbiology No results found for this or any previous visit (from the past 240 hour(s)).  Time coordinating discharge: Approximately 40 minutes  Patrecia Pour, MD  [facility] 12/05/2018, 2:20  PM Pager [phone]

## 2018-12-05 NOTE — Progress Notes (Signed)
Discharge instructions explained to patient and wife. No questions. Writer will wheel patient out the front of the building

## 2018-12-06 NOTE — Telephone Encounter (Signed)
Noted. Thanks for reaching out, [name]!   [name], MD [facility]

## 2018-12-10 ENCOUNTER — Ambulatory Visit (INDEPENDENT_AMBULATORY_CARE_PROVIDER_SITE_OTHER): Payer: Medicare HMO | Admitting: Family Medicine

## 2018-12-10 ENCOUNTER — Encounter: Payer: Self-pay | Admitting: Family Medicine

## 2018-12-10 VITALS — BP 131/79 | HR 79 | Resp 18 | Ht 70.5 in | Wt 229.4 lb

## 2018-12-10 DIAGNOSIS — J439 Emphysema, unspecified: Secondary | ICD-10-CM | POA: Diagnosis not present

## 2018-12-10 DIAGNOSIS — Z7689 Persons encountering health services in other specified circumstances: Secondary | ICD-10-CM

## 2018-12-10 DIAGNOSIS — R079 Chest pain, unspecified: Secondary | ICD-10-CM

## 2018-12-10 DIAGNOSIS — R0609 Other forms of dyspnea: Secondary | ICD-10-CM | POA: Diagnosis not present

## 2018-12-10 DIAGNOSIS — R0600 Dyspnea, unspecified: Secondary | ICD-10-CM

## 2018-12-10 MED ORDER — BACLOFEN 10 MG PO TABS: 10.0000 mg | ORAL_TABLET | Freq: Every day | ORAL | 3 refills | Status: DC | PRN

## 2018-12-10 MED ORDER — BISOPROLOL FUMARATE 5 MG PO TABS: 5.0000 mg | ORAL_TABLET | Freq: Every day | ORAL | 1 refills | Status: DC

## 2018-12-10 MED ORDER — LORATADINE 10 MG PO TABS: 10.0000 mg | ORAL_TABLET | Freq: Every day | ORAL | 3 refills | Status: DC | PRN

## 2018-12-10 NOTE — Patient Instructions (Addendum)
Thank you for choosing Primary Care at [facility] to be your medical home!    [name] was seen by Molli Barrows, FNP today.   [name]'s primary care provider is Jonathon Jun, FNP.   For the best care possible, you should try to see Molli Barrows, FNP-C whenever you come to the clinic.   We look forward to seeing you again soon!  If you have any questions about your visit today, please call us at [phone] or feel free to reach your primary care provider via [name].      Nonspecific Chest Pain Chest pain can be caused by many different conditions. Some causes of chest pain can be life-threatening. These will require treatment right away. Serious causes of chest pain include:  Heart attack.  A tear in the body's main blood vessel.  Redness and swelling (inflammation) around your heart.  Blood clot in your lungs. Other causes of chest pain may not be so serious. These include:  Heartburn.  Anxiety or stress.  Damage to bones or muscles in your chest.  Lung infections. Chest pain can feel like:  Pain or discomfort in your chest.  Crushing, pressure, aching, or squeezing pain.  Burning or tingling.  Dull or sharp pain that is worse when you move, cough, or take a deep breath.  Pain or discomfort that is also felt in your back, neck, jaw, shoulder, or arm, or pain that spreads to any of these areas. It is hard to know whether your pain is caused by something that is serious or something that is not so serious. So it is important to see your doctor right away if you have chest pain. Follow these instructions at home: Medicines  Take over-the-counter and prescription medicines only as told by your doctor.  If you were prescribed an antibiotic medicine, take it as told by your doctor. Do not stop taking the antibiotic even if you start to feel better. Lifestyle   Rest as told by your doctor.  Do not use any products that contain  nicotine or tobacco, such as cigarettes, e-cigarettes, and chewing tobacco. If you need help quitting, ask your doctor.  Do not drink alcohol.  Make lifestyle changes as told by your doctor. These may include: ? Getting regular exercise. Ask your doctor what activities are safe for you. ? Eating a heart-healthy diet. A diet and nutrition specialist (dietitian) can help you to learn healthy eating options. ? Staying at a healthy weight. ? Treating diabetes or high blood pressure, if needed. ? Lowering your stress. Activities such as yoga and relaxation techniques can help. General instructions  Pay attention to any changes in your symptoms. Tell your doctor about them or any new symptoms.  Avoid any activities that cause chest pain.  Keep all follow-up visits as told by your doctor. This is important. You may need more testing if your chest pain does not go away. Contact a doctor if:  Your chest pain does not go away.  You feel depressed.  You have a fever. Get help right away if:  Your chest pain is worse.  You have a cough that gets worse, or you cough up blood.  You have very bad (severe) pain in your belly (abdomen).  You pass out (faint).  You have either of these for no clear reason: ? Sudden chest discomfort. ? Sudden discomfort in your arms, back, neck, or jaw.  You have shortness of breath at any time.  You suddenly start to sweat, or your skin gets clammy.  You feel sick to your stomach (nauseous).  You throw up (vomit).  You suddenly feel lightheaded or dizzy.  You feel very weak or tired.  Your heart starts to beat fast, or it feels like it is skipping beats. These symptoms may be an emergency. Do not wait to see if the symptoms will go away. Get medical help right away. Call your local emergency services (911 in the [place] Do not drive yourself to the hospital. Summary  Chest pain can be caused by many different conditions. The cause may be serious  and need treatment right away. If you have chest pain, see your doctor right away.  Follow your doctor's instructions for taking medicines and making lifestyle changes.  Keep all follow-up visits as told by your doctor. This includes visits for any further testing if your chest pain does not go away.  Be sure to know the signs that show that your condition has become worse. Get help right away if you have these symptoms. This information is not intended to replace advice given to you by your health care provider. Make sure you discuss any questions you have with your health care provider. Document Released: 03/19/2008 Document Revised: 04/03/2018 Document Reviewed: 04/03/2018 Elsevier Interactive Patient Education  2019 Elsevier Inc.       COPD and Physical Activity Chronic obstructive pulmonary disease (COPD) is a long-term (chronic) condition that affects the lungs. COPD is a general term that can be used to describe many different lung problems that cause lung swelling (inflammation) and limit airflow, including chronic bronchitis and emphysema. The main symptom of COPD is shortness of breath, which makes it harder to do even simple tasks. This can also make it harder to exercise and be active. Talk with your health care provider about treatments to help you breathe better and actions you can take to prevent breathing problems during physical activity. What are the benefits of exercising with COPD? Exercising regularly is an important part of a healthy lifestyle. You can still exercise and do physical activities even though you have COPD. Exercise and physical activity improve your shortness of breath by increasing blood flow (circulation). This causes your heart to pump more oxygen through your body. Moderate exercise can improve your:  Oxygen use.  Energy level.  Shortness of breath.  Strength in your breathing muscles.  Heart health.  Sleep.  Self-esteem and feelings of  self-worth.  Depression, stress, and anxiety levels. Exercise can benefit everyone with COPD. The severity of your disease may affect how hard you can exercise, especially at first, but everyone can benefit. Talk with your health care provider about how much exercise is safe for you, and which activities and exercises are safe for you. What actions can I take to prevent breathing problems during physical activity?  Sign up for a pulmonary rehabilitation program. This type of program may include: ? Education about lung diseases. ? Exercise classes that teach you how to exercise and be more active while improving your breathing. This usually involves:  Exercise using your lower extremities, such as a stationary bicycle.  About 30 minutes of exercise, 2 to 5 times per week, for 6 to 12 weeks  Strength training, such as push ups or leg lifts. ? Nutrition education. ? Group classes in which you can talk with others who also have COPD and learn ways to manage stress.  If you use an oxygen tank, you should use  it while you exercise. Work with your health care provider to adjust your oxygen for your physical activity. Your resting flow rate is different from your flow rate during physical activity.  While you are exercising: ? Take slow breaths. ? Pace yourself and do not try to go too fast. ? Purse your lips while breathing out. Pursing your lips is similar to a kissing or whistling position. ? If doing exercise that uses a quick burst of effort, such as weight lifting:  Breathe in before starting the exercise.  Breathe out during the hardest part of the exercise (such as raising the weights). Where to find support You can find support for exercising with COPD from:  Your health care provider.  A pulmonary rehabilitation program.  Your local health department or community health programs.  Support groups, online or in-person. Your health care provider may be able to recommend support  groups. Where to find more information You can find more information about exercising with COPD from:  American Lung Association: ClassInsider.se.  COPD Foundation: https://www.rivera.net/. Contact a health care provider if:  Your symptoms get worse.  You have chest pain.  You have nausea.  You have a fever.  You have trouble talking or catching your breath.  You want to start a new exercise program or a new activity. Summary  COPD is a general term that can be used to describe many different lung problems that cause lung swelling (inflammation) and limit airflow. This includes chronic bronchitis and emphysema.  Exercise and physical activity improve your shortness of breath by increasing blood flow (circulation). This causes your heart to provide more oxygen to your body.  Contact your health care provider before starting any exercise program or new activity. Ask your health care provider what exercises and activities are safe for you. This information is not intended to replace advice given to you by your health care provider. Make sure you discuss any questions you have with your health care provider. Document Released: 10/24/2017 Document Revised: 10/24/2017 Document Reviewed: 10/24/2017 Elsevier Interactive Patient Education  2019 [employer].

## 2018-12-10 NOTE — Progress Notes (Signed)
[name], is a 68 y.o. male  UJW:[id number]  FAO:[id number]  DOB - 18-Mar-1951  CC:  Chief Complaint  Patient presents with  . Establish Care  . Hospitalization Follow-up    ED->Hospital 2/20-2/21: moderate persistant asthma, COPD exacerbation       HPI: [name] is a 68 y.o. male is here today to establish care and hospital follow-up.  [name] has Post-polio syndrome; Other emphysema ([name]); Hypertension; Former smoker; h/o Prostate cancer; History of tuberculosis; Bipolar disorder ([name]); Explosive personality disorder ([name]); Erectile dysfunction; Nephrolithiasis; Chronic pain; Alcoholic gastritis; Obstructive sleep apnea; Neuropathy ([name]); Diverticulum of bladder; Tuberculosis; concerns for memory loss; Benign localized hyperplasia of prostate with urinary obstruction; Joint stiffness of multiple sites; Gastroesophageal reflux disease without esophagitis; Esophageal stricture; Screening for colon cancer; Benign neoplasm of cecum; Benign neoplasm of ascending colon; Benign neoplasm of transverse colon; Benign neoplasm of sigmoid colon; Rectal polyp; Chronic idiopathic constipation; OAB (overactive bladder); Anxiety; Dry mouth; h/o Suicidal thoughts; Alcohol withdrawal ([name]); Alcohol abuse with alcohol-induced mood disorder ([name]); Cervical radiculopathy; Schizophrenia ([name]); Environmental and seasonal allergies; Diabetes mellitus without complication- (diet controlled; w/o proteinuria); Heavy alcohol use- Many yrs ; Basal cell carcinoma of back; Obesity; Hx of multiple concussions; h/o Vitamin D deficiency; Screen for sexually transmitted diseases; Immunocompromised state ([name]); Patient's noncompliance with other medical treatment and regimen; Hyperlipidemia, mixed; Elevated liver enzymes ; non-specific Chest pain; Diabetes mellitus type 2 in obese ([name]); Shortness of breath; Nausea without vomiting; Asthma exacerbation; COPD exacerbation ([name]); Allergic rhinitis; Severe persistent asthma  without complication; Bee sting-induced anaphylaxis; Anaphylactic reaction due to food, initial encounter; Current use of beta blocker; COPD with asthma ([name]); Seasonal and perennial allergic rhinitis; PSVT (paroxysmal supraventricular tachycardia) ([name]); Acute urinary retention; Generalized anxiety disorder; Nocturnal hypoxia; S/P arthroscopy of right shoulder; and COPD with acute exacerbation ([name]) on their problem list.   Extensive active problem list reviewed.   Hospital follow-up Patient needs a referral to pulmonology. Recently hospitalized for an acute COPD/Asthma exacerbation. He is oxygen dependent at nighttime only, 2 liters.  Hospitalized 12/04/2018 acute onset dyspnea with cough.  Symptoms had progressively worsened and patient presented to the ER on 2/20 patient was subsequently admitted for COPD exacerbation.  He was treated with IV steroids, magnesium and breathing treatments.  Symptoms subsequently improved.  Discharge recommendations included a referral to pulmonology.  Patient has only been followed in the past by asthma and allergy specialist.  He was also discharged on a prednisone taper which she is almost completed.  He has completed azithromycin prescription.  Also administering home nebulizer treatments as needed.  He denies any worsening shortness of breath, wheezing, or distress with breathing.  Current COPD regimen includes albuterol both inhaler and nebulizer, budesonide nebulizer treatment and Symbicort which is combination budesonide and formoterol daily.  Has Atrovent is as needed for duo nebs.  He is able to tolerate outdoor yard work without distress.  Persistent chest pain at rest and with activity.  He has had normal EKGs during his recent hospitalization however reports in the past he has been seen by a cardiologist but nothing recently.  He is requesting a referral to cardiology as he feels he needs a stress test.  Dr. Harrington Challenger is the chest pain as sharp once it occurs.  He  reports the duration is approximately 15 minutes.  Has any pertaining events or exacerbating factors to cause chest pain.  Chest pain is not occurring with acute shortness of breath. He has no additional complaints nor does he  need medication refills today.  Other chronic conditions include: Diabetes, blood sugars ran rather high during hospitalization however patient was receiving IV steroids for management of acute respiratory distress.  Does not check blood sugar at home nor is he currently on any medication. Hypertension takes bisoprolol.  No home monitoring of blood pressure.  No acute chest pain, shortness of breath, or swelling.  He does not adhere to a DASH diet.  Other chronic conditions notated under problem list. He reports no need for medication refills today.  Current medications: Current Outpatient Medications:  .  acetaminophen (TYLENOL) 500 MG tablet, Take 500 mg by mouth every 6 (six) hours as needed for headache (pain)., Disp: , Rfl:  .  albuterol (PROAIR HFA) 108 (90 Base) MCG/ACT inhaler, Inhale 2 puffs into the lungs every 4 (four) hours as needed for wheezing or shortness of breath., Disp: 1 Inhaler, Rfl: 3 .  albuterol (PROVENTIL) (2.5 MG/3ML) 0.083% nebulizer solution, Take 2.5 mg by nebulization every 6 (six) hours as needed for wheezing or shortness of breath., Disp: , Rfl:  .  azithromycin (ZITHROMAX) 250 MG tablet, Take 1 tablet (250 mg total) by mouth daily., Disp: 4 each, Rfl: 0 .  baclofen (LIORESAL) 10 MG tablet, Take 1 tablet (10 mg total) by mouth daily as needed for muscle spasms., Disp: 30 each, Rfl: 3 .  bisoprolol (ZEBETA) 5 MG tablet, Take 1 tablet (5 mg total) by mouth daily., Disp: 90 tablet, Rfl: 1 .  budesonide (PULMICORT) 0.25 MG/2ML nebulizer solution, Take 2 mLs (0.25 mg total) by nebulization 2 (two) times daily., Disp: 120 mL, Rfl: 5 .  budesonide-formoterol (SYMBICORT) 160-4.5 MCG/ACT inhaler, Two puffs with spacer twice a day to prevent cough or  wheeze.  Rinse, gargle and spit after use. (Patient taking differently: Inhale 2 puffs into the lungs daily. ), Disp: 10.2 g, Rfl: 5 .  EPINEPHrine 0.3 mg/0.3 mL IJ SOAJ injection, Inject 0.3 mLs (0.3 mg total) into the muscle once. (Patient taking differently: Inject 0.3 mg into the muscle once as needed (severe allergic reactions). ), Disp: 1 Device, Rfl: 1 .  esomeprazole (NEXIUM) 40 MG capsule, Take 40 mg by mouth daily at 12 noon., Disp: , Rfl:  .  fluticasone (FLONASE) 50 MCG/ACT nasal spray, Place 1 spray into both nostrils daily as needed. (Patient taking differently: Place 1 spray into both nostrils daily as needed for allergies. ), Disp: 16 g, Rfl: 5 .  ipratropium-albuterol (DUONEB) 0.5-2.5 (3) MG/3ML SOLN, One vial in nebulizer every 4-6 hours as needed for cough or wheeze., Disp: 75 mL, Rfl: 2 .  loratadine (CLARITIN) 10 MG tablet, Take 1 tablet (10 mg total) by mouth daily as needed for allergies., Disp: 30 tablet, Rfl: 3 .  Menthol, Topical Analgesic, (BIOFREEZE EX), Apply 1 application topically daily as needed (knee pain). TO THE LEFT KNEE AS NEEDED FOR PAIN , Disp: , Rfl:  .  nitroGLYCERIN (NITROSTAT) 0.4 MG SL tablet, Place 1 tablet (0.4 mg total) under the tongue every 5 (five) minutes as needed for chest pain., Disp: 30 tablet, Rfl: 3 .  OXYGEN, Inhale 2 L into the lungs at bedtime. DURING THE DAY IF NEEDED Reported on 11/25/2015, Disp: , Rfl:  .  predniSONE (DELTASONE) 10 MG tablet, Take 4 tablets (40 mg total) by mouth daily for 3 days, THEN 3 tablets (30 mg total) daily for 3 days, THEN 2 tablets (20 mg total) daily for 3 days, THEN 1 tablet (10 mg total) daily for 3 days.,  Disp: 30 tablet, Rfl: 0 .  Vitamin D, Ergocalciferol, (DRISDOL) 50000 units CAPS capsule, Take 50,000 Units by mouth every 14 (fourteen) days. , Disp: , Rfl:    Pertinent family medical history: family history includes Alcohol abuse in his sister and sister; Alzheimer's disease in his paternal aunt and paternal  uncle; Alzheimer's disease (age of onset: 63) in his father; Diabetes in his maternal aunt and maternal uncle; Heart attack in his father; Heart disease in his father; Mental illness in his sister; Skin cancer in his mother; Stomach cancer in his father; Thyroid disease in his maternal aunt; Tuberculosis in his paternal grandfather and paternal grandmother.   Allergies  Allergen Reactions  . Aspirin Anaphylaxis  . Hornet Venom Anaphylaxis  . Ivp Dye [Iodinated Diagnostic Agents] Anaphylaxis  . Levaquin [Levofloxacin In D5w] Shortness Of Breath and Swelling    In addition: sweating, chest pain, and diarrhea.   . Nsaids Anaphylaxis  . Penicillins Anaphylaxis      . Tolmetin Anaphylaxis  . Fasenra [Benralizumab] Hives  . Perforomist [Formoterol]     Increased wheezing, shortness of breath  . Buprenorphine Hcl Nausea And Vomiting    Can take with zofran   . Morphine And Related Nausea And Vomiting    Can take with zofran   . Oxycodone Itching and Rash    Social History   Socioeconomic History  . Marital status: Married    Spouse name: Not on file  . Number of children: Not on file  . Years of education: Not on file  . Highest education level: Not on file  Occupational History  . Occupation: Retired  Scientific laboratory technician  . Financial resource strain: Not on file  . Food insecurity:    Worry: Not on file    Inability: Not on file  . Transportation needs:    Medical: Not on file    Non-medical: Not on file  Tobacco Use  . Smoking status: Former Smoker    Years: 47.00    Types: Pipe    Last attempt to quit: 08/25/2013    Years since quitting: 5.2  . Smokeless tobacco: Former Systems developer    Types: Chew  . Tobacco comment: 5-6 times smoking a pipe  Substance and Sexual Activity  . Alcohol use: Yes    Alcohol/week: 14.0 standard drinks    Types: 14 Cans of beer per week    Comment: 2 cans daily  . Drug use: No  . Sexual activity: Yes    Birth control/protection: None  Lifestyle  .  Physical activity:    Days per week: Not on file    Minutes per session: Not on file  . Stress: Not on file  Relationships  . Social connections:    Talks on phone: Not on file    Gets together: Not on file    Attends religious service: Not on file    Active member of club or organization: Not on file    Attends meetings of clubs or organizations: Not on file    Relationship status: Not on file  . Intimate partner violence:    Fear of current or ex partner: Not on file    Emotionally abused: Not on file    Physically abused: Not on file    Forced sexual activity: Not on file  Other Topics Concern  . Not on file  Social History Narrative  . Not on file    Review of Systems: Pertinent negatives listed in HPI Objective:  Vitals:   12/10/18 0903  BP: 131/79  Pulse: 79  Resp: 18  SpO2: 97%    BP Readings from Last 3 Encounters:  12/10/18 131/79  12/05/18 127/65  10/30/18 (!) 146/70    Filed Weights   12/10/18 0903  Weight: 229 lb 6.4 oz (104.1 kg)      Physical Exam: Constitutional: Patient appears well-developed and well-nourished. No distress. HENT: Normocephalic, atraumatic, External right and left ear normal. Oropharynx is clear and moist.  Eyes: Conjunctivae and EOM are normal. PERRLA, no scleral icterus. Neck: Normal ROM. Neck supple. No JVD. No tracheal deviation. No thyromegaly. CVS: RRR, S1/S2 +, no murmurs, no gallops, no carotid bruit.  Pulmonary: Effort and breath sounds normal, no stridor, rhonchi, wheezes, rales.  Abdominal: Soft. BS +, no distension, tenderness, rebound or guarding.  Musculoskeletal: Normal range of motion. No edema and no tenderness.  Neuro: Alert. Normal muscle tone coordination. Normal gait. BUE and BLE strength 5/5. Bilateral hand grips symmetrical. Skin: Skin is warm and dry. No rash noted. Not diaphoretic. No erythema. No pallor. Psychiatric: Normal mood and affect. Behavior, judgment, thought content normal.  Lab Results  (prior encounters)  Lab Results  Component Value Date   WBC 7.9 12/04/2018   HGB 14.3 12/04/2018   HCT 44.9 12/04/2018   MCV 99.1 12/04/2018   PLT 242 12/04/2018   Lab Results  Component Value Date   CREATININE 0.97 12/04/2018   BUN 10 12/04/2018   NA 133 (L) 12/04/2018   K 4.1 12/04/2018   CL 101 12/04/2018   CO2 22 12/04/2018    Lab Results  Component Value Date   HGBA1C 5.9 04/30/2016       Component Value Date/Time   CHOL 181 07/17/2016 1630   TRIG 245.0 (H) 07/17/2016 1630   HDL 48.00 07/17/2016 1630   CHOLHDL 4 07/17/2016 1630   VLDL 49.0 (H) 07/17/2016 1630   LDLCALC 150 (H) 05/07/2016 1024       Assessment and plan:  1. Encounter to establish care 2. Pulmonary emphysema, unspecified emphysema type (Ray), COPD - Ambulatory referral to Pulmonology Continue current regimen as prescribed by asthma and allergy specialist. Continue duo nebs as needed every 6 hours.  Oxygen therapy at bedtime 2 L.  Continue to monitor oxygen at home and notify office his oxygen is persistently less than 88% or go immediately to the ER 3. Dyspnea on exertion - Ambulatory referral to Pulmonology  4. Chest pain, unspecified type, nonspecific - Ambulatory referral to Cardiology   Follow-up on diabetes and repeated A1c in 4 weeks.  Patient currently has received multiple doses of prednisone which will likely reflect poor glycemic control. Once completed will evaluate glycemic control and determine if patient would warrant diabetes management with medications    Return in about 4 weeks (around 01/07/2019) for hypertension and diabetes management .   The patient was given clear instructions to go to ER or return to medical center if symptoms don't improve, worsen or Jonathon problems develop. The patient verbalized understanding. The patient was advised  to call and obtain lab results if they haven't heard anything from out office within 7-10 business days.  Molli Barrows, FNP Primary  Care at [facility] [address] [place] [address] [phone]: [phone]    This note has been created with Dragon speech recognition software and Engineer, materials. Any transcriptional errors are unintentional.

## 2018-12-25 IMAGING — CR DG CHEST 2V
2 series · 2 of 2 positions shown · non-contrast
Comparison: 01/04/2017

CLINICAL DATA: Shortness of breath and dizziness

EXAM:
CHEST  2 VIEW

[w chest pa]
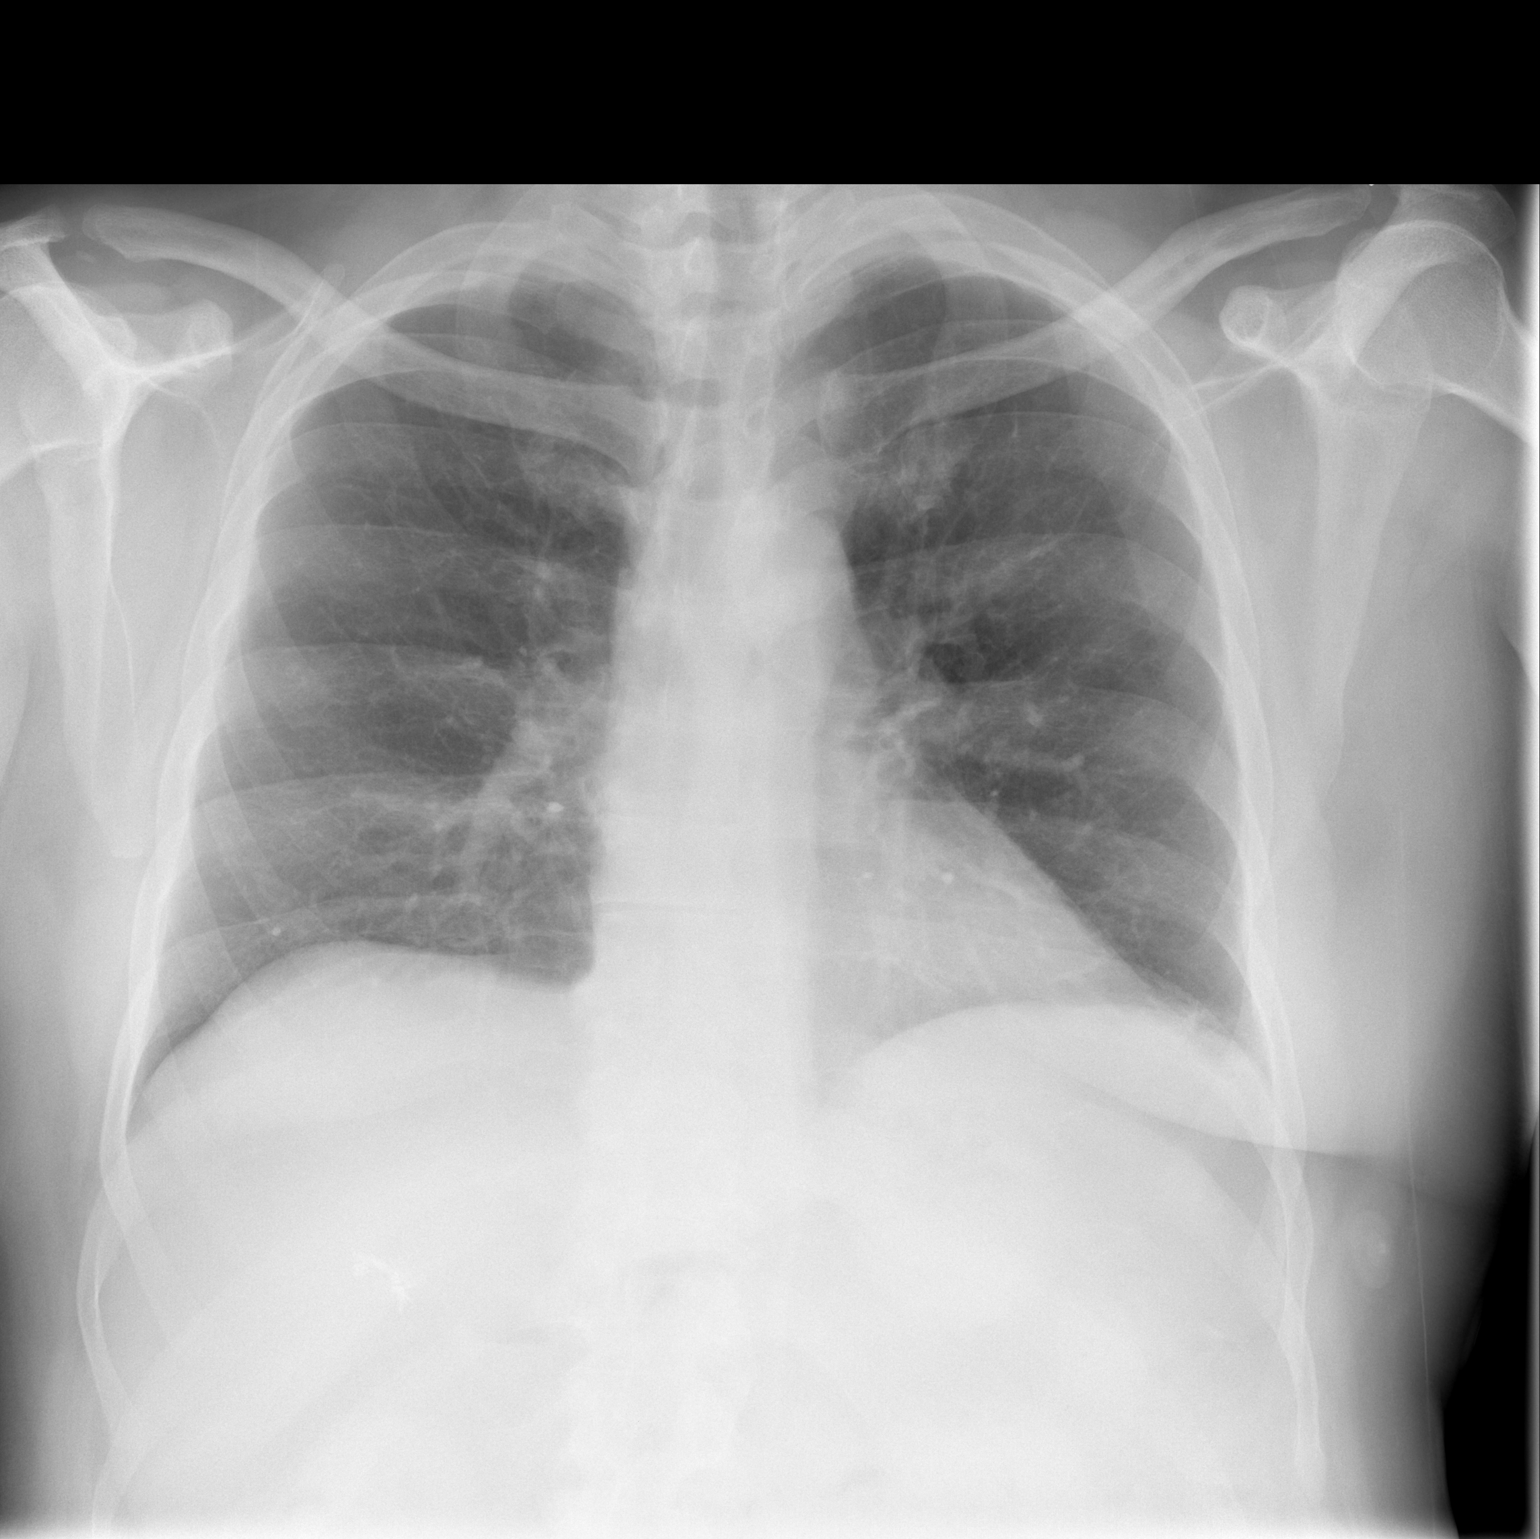

[w chest lat]
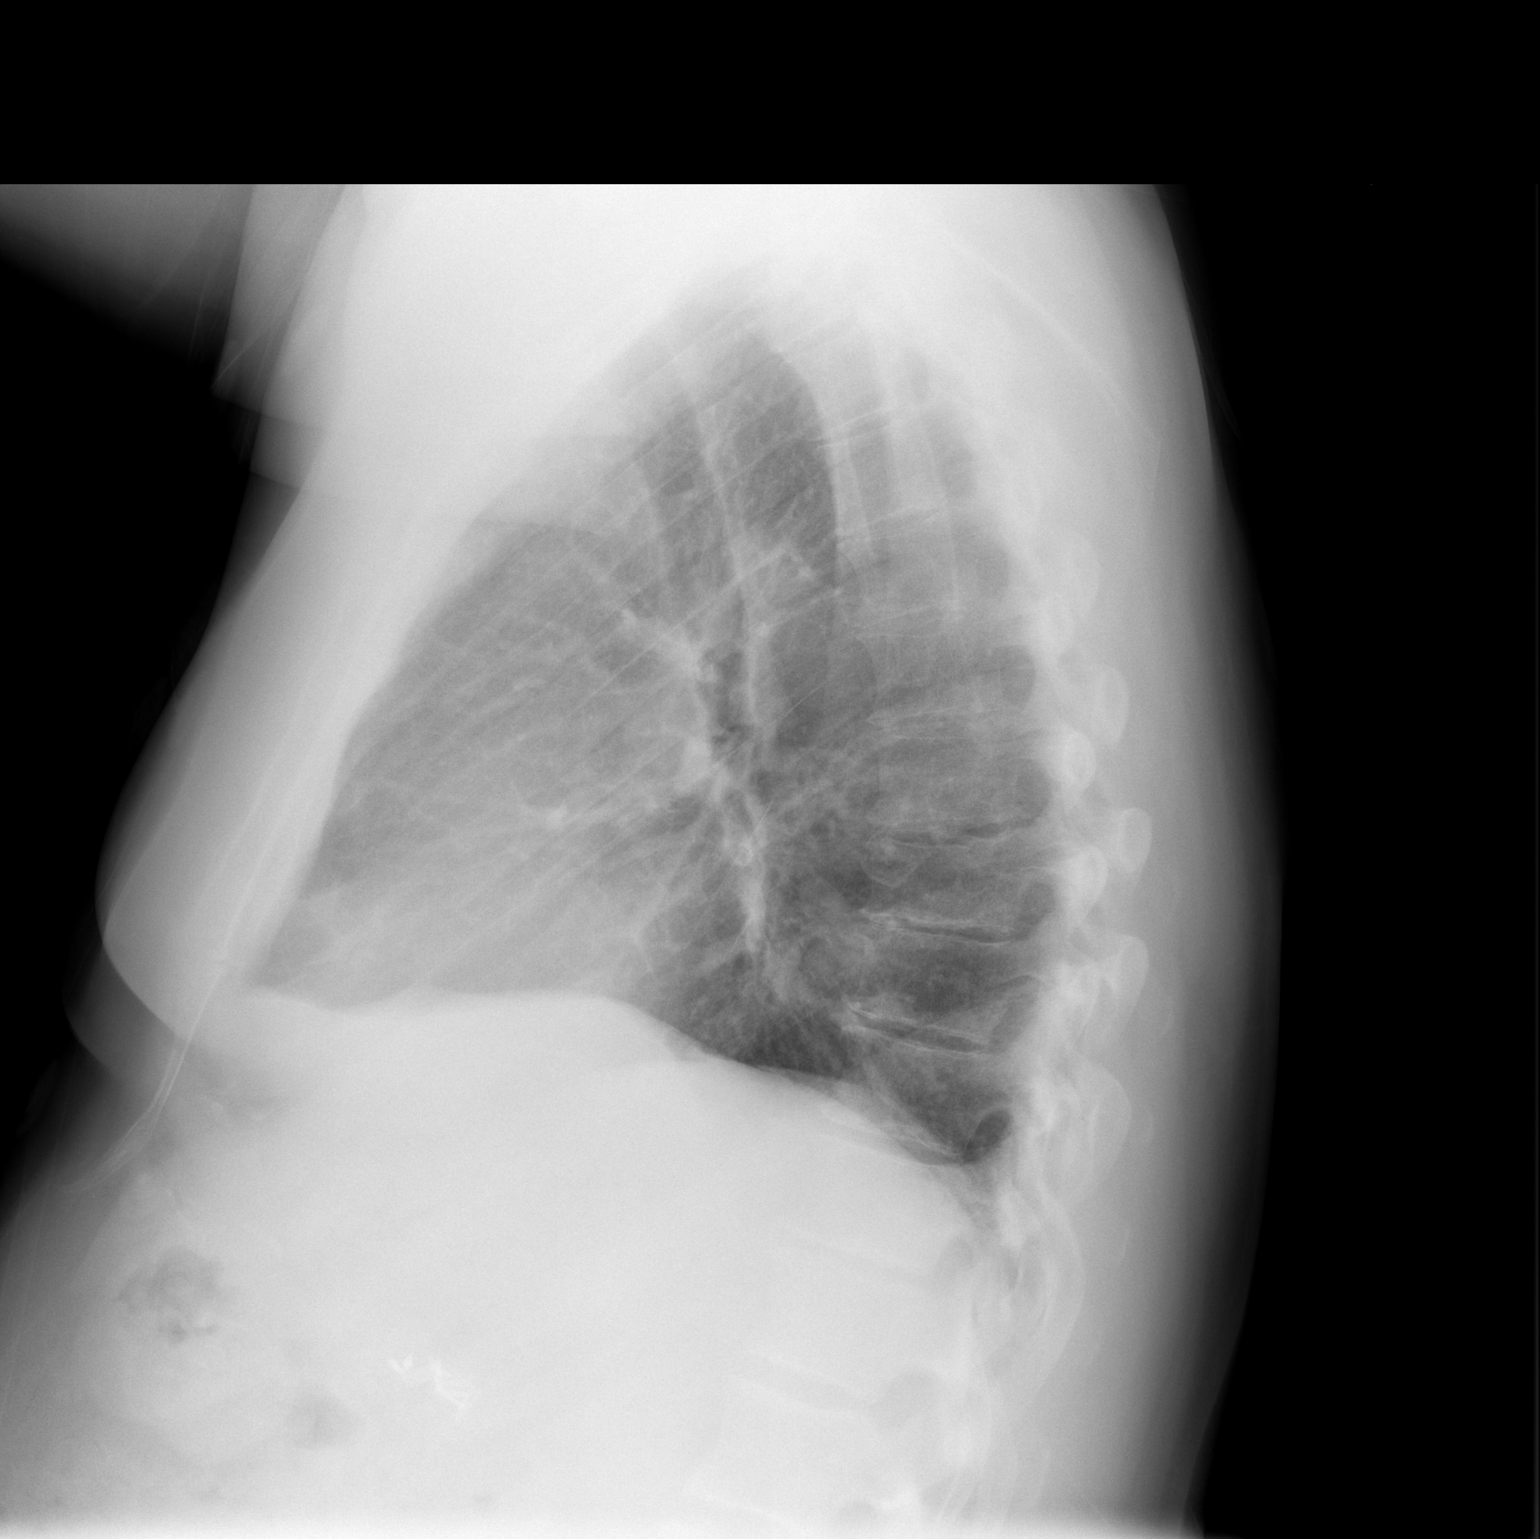

[2 of 2 positions shown; findings below may reference images not displayed]

FINDINGS: The heart size and mediastinal contours are within normal limits.
Both lungs are clear. The visualized skeletal structures are
unremarkable.
IMPRESSION: No active cardiopulmonary disease.

## 2018-12-26 ENCOUNTER — Telehealth: Payer: Self-pay | Admitting: Allergy & Immunology

## 2018-12-26 ENCOUNTER — Ambulatory Visit: Payer: Medicare HMO | Admitting: Allergy & Immunology

## 2018-12-26 DIAGNOSIS — J309 Allergic rhinitis, unspecified: Secondary | ICD-10-CM

## 2018-12-26 MED ORDER — BUDESONIDE-FORMOTEROL FUMARATE 160-4.5 MCG/ACT IN AERO: INHALATION_SPRAY | RESPIRATORY_TRACT | 0 refills | Status: DC

## 2018-12-26 MED ORDER — IPRATROPIUM-ALBUTEROL 0.5-2.5 (3) MG/3ML IN SOLN: RESPIRATORY_TRACT | 0 refills | Status: DC

## 2018-12-26 NOTE — Telephone Encounter (Signed)
Patient call and said that he thought his appointment was at 3:30 and then came home and noticed that the paper said 10:30. We were obviously closed when he came by. He requested a refill of his medications and a call on Monday to reschedule his appointment.  I refilled his Symbicort and his DuoNeb without refills. Left voicemail for the patient.  [name], MD [facility]

## 2018-12-29 NOTE — Telephone Encounter (Signed)
Can you guys call and reschedule his appt

## 2018-12-29 NOTE — Telephone Encounter (Signed)
Patient is scheduled 01-08-2019

## 2019-01-01 ENCOUNTER — Telehealth: Payer: Self-pay | Admitting: Family Medicine

## 2019-01-01 NOTE — Telephone Encounter (Signed)
[name] from [facility] was calling to request verbal orders for 1 time a week for 2 weeks of skilled nursing, please follow up.

## 2019-01-01 NOTE — Telephone Encounter (Signed)
Returned call and provided verbal orders to allow 1 time per week skilled nursing for minimal 2 week.

## 2019-01-07 ENCOUNTER — Ambulatory Visit: Payer: Self-pay | Admitting: Family Medicine

## 2019-01-08 ENCOUNTER — Ambulatory Visit: Payer: Medicare HMO | Admitting: Allergy & Immunology

## 2019-01-08 ENCOUNTER — Telehealth: Payer: Self-pay

## 2019-01-08 NOTE — Telephone Encounter (Signed)
Called patient to do his pre-visit COVID screening  Patient states that he hasn't recently traveled internationally or to any hot spots in the [name].  Denies fever, cough, body aches. Does have some SHOB but he has asthma & emphysema. Has not had any contact with anybody who has recently traveled or has been diagnosed with COVID or experiencing fever, cough or SHOB.  Patient did ask if we were making patients wear a mask when they come into the office. Let him know that due to medical history we would ask him to wear a mask. He states that he will wear a mask from home that has a filter.

## 2019-01-09 ENCOUNTER — Ambulatory Visit (INDEPENDENT_AMBULATORY_CARE_PROVIDER_SITE_OTHER): Payer: Medicare HMO | Admitting: Family Medicine

## 2019-01-09 ENCOUNTER — Telehealth: Payer: Self-pay

## 2019-01-09 ENCOUNTER — Encounter: Payer: Self-pay | Admitting: Family Medicine

## 2019-01-09 ENCOUNTER — Other Ambulatory Visit: Payer: Self-pay

## 2019-01-09 VITALS — BP 136/86 | HR 64 | Temp 97.4°F | Resp 17 | Ht 70.0 in | Wt 228.0 lb

## 2019-01-09 DIAGNOSIS — E785 Hyperlipidemia, unspecified: Secondary | ICD-10-CM

## 2019-01-09 DIAGNOSIS — I10 Essential (primary) hypertension: Secondary | ICD-10-CM

## 2019-01-09 DIAGNOSIS — E1169 Type 2 diabetes mellitus with other specified complication: Secondary | ICD-10-CM | POA: Diagnosis not present

## 2019-01-09 DIAGNOSIS — Z9981 Dependence on supplemental oxygen: Secondary | ICD-10-CM

## 2019-01-09 DIAGNOSIS — J449 Chronic obstructive pulmonary disease, unspecified: Secondary | ICD-10-CM

## 2019-01-09 LAB — GLUCOSE, POCT (MANUAL RESULT ENTRY): POC Glucose: 123 mg/dl — AB (ref 70–99)

## 2019-01-09 MED ORDER — HYDROXYZINE HCL 25 MG PO TABS: 25.0000 mg | ORAL_TABLET | Freq: Three times a day (TID) | ORAL | 1 refills | Status: DC | PRN

## 2019-01-09 MED ORDER — PREDNISONE 5 MG PO TABS: 5.0000 mg | ORAL_TABLET | Freq: Two times a day (BID) | ORAL | 2 refills | Status: DC

## 2019-01-09 MED ORDER — HYDROXYZINE HCL 10 MG PO TABS: 10.0000 mg | ORAL_TABLET | Freq: Three times a day (TID) | ORAL | 1 refills | Status: DC | PRN

## 2019-01-09 MED ORDER — PREDNISONE 5 MG PO TABS: 5.0000 mg | ORAL_TABLET | Freq: Every day | ORAL | 2 refills | Status: DC

## 2019-01-09 NOTE — Telephone Encounter (Signed)
Attempted to contact pt to schedule tele-health visit. Left message to call back.

## 2019-01-09 NOTE — Progress Notes (Signed)
Patient ID: [name], male    DOB: 07-Mar-1951, 68 y.o.   MRN: [id number]  PCP: [name], FNP  Chief Complaint  Patient presents with  . Diabetes  . Hypertension    Subjective:  HPI  [name] is a 68 y.o. male presents for evaluation diabetes, hypertension, anxiety, and dyspnea. [name] medical history includes has Post-polio syndrome; Other emphysema ([place]); Hypertension; Former smoker; h/o Prostate cancer; History of tuberculosis; Bipolar disorder ([place]); Explosive personality disorder ([place]); Erectile dysfunction; Nephrolithiasis; Chronic pain; Alcoholic gastritis; Obstructive sleep apnea; Neuropathy ([place]); Diverticulum of bladder; Tuberculosis; concerns for memory loss; Benign localized hyperplasia of prostate with urinary obstruction; Joint stiffness of multiple sites; Gastroesophageal reflux disease without esophagitis; Esophageal stricture; Screening for colon cancer; Benign neoplasm of cecum; Benign neoplasm of ascending colon; Benign neoplasm of transverse colon; Benign neoplasm of sigmoid colon; Rectal polyp; Chronic idiopathic constipation; OAB (overactive bladder); Anxiety; Dry mouth; h/o Suicidal thoughts; Alcohol withdrawal ([place]); Alcohol abuse with alcohol-induced mood disorder ([place]); Cervical radiculopathy; Schizophrenia ([place]); Environmental and seasonal allergies; Diabetes mellitus without complication- (diet controlled; w/o proteinuria); Heavy alcohol use- Many yrs ; Basal cell carcinoma of back; Obesity; Hx of multiple concussions; h/o Vitamin D deficiency; Screen for sexually transmitted diseases; Immunocompromised state ([place]); Patient's noncompliance with other medical treatment and regimen; Hyperlipidemia, mixed; Elevated liver enzymes ; non-specific Chest pain; Diabetes mellitus type 2 in obese ([place]); Shortness of breath; Nausea without vomiting; Asthma exacerbation; COPD exacerbation ([place]); Allergic rhinitis; Severe persistent asthma without  complication; Bee sting-induced anaphylaxis; Anaphylactic reaction due to food, initial encounter; Current use of beta blocker; COPD with asthma ([place]); Seasonal and perennial allergic rhinitis; PSVT (paroxysmal supraventricular tachycardia) ([place]); Acute urinary retention; Generalized anxiety disorder; Nocturnal hypoxia; S/P arthroscopy of right shoulder; and COPD with acute exacerbation ([place]).  COPD/Dypsnea  Dardan is currently under home health services through [facility]. Received a call from home health nurse on 01/01/19 that patient had doubled up on medications. Today, he admits to doubling up on medication and indicates he did this to "help with breathing". He reports he is having persistent shortness of breath and chest tightness secondary to shortness of breath. Work of breathing increases at night. He is oxygen dependent at night with 2 liters. He doesn't titrate oxygen above 2 liters. He occasionally requires oxygen during the day. He is prescribed LABA and SABA treatments which he admits to over use by using more often than prescribed.  Patient suffers from OSA and was previously prescribed a CPAP which he endorses he no longer has.  He has been referred to pulmonology and is scheduled for a follow-up later next month.  His oxygen saturation today is normal at 97%  In review prior note from Dr. Ernst Bowler patient is to be prescribed a low daily dose twice daily of prednisone, 5 mg twice daily.  Patient is currently not taking prednisone twice a day and currently has no prednisone left at home.  He reports prednisone did not really help his work of breathing.  He endorses periodic wheezing throughout the day which he reports minimal improvement with current medication regimen.  Hypertension/ Diabetes She has a history of hypertension and diabetes. Diabetes in the past has been well controlled with diet and is currently not treated with any medication for diabetes control.  Patient is unsure of  last A1c. Chronic comorbidities patient is an active of routine physical exercise.  For her hypertension he is prescribed bisoprolol.  He reports his  blood pressure is elevated at times after heavy alcohol intake and or use albuterol or albuterol neb treatments. Reports blood pressure has been elevated recently however he did admit to overuse of prescribed inhalers. Patient suffers from underlying angina and is scheduled to see cardiology next week for evaluation.  Anxiety Patient has a complex mental health history with a history of alcohol abuse induced mood disorder, polar disorder, explosive personality disorder and schizophrenia.  He complains of recent increase in anxiety as he has been home for the most part and he is very anxious regarding overall state of health.  He reports he is normally very active and able to do home-improvement things around the house however due to recent health decline he has been unable to gauge in these activities.  He reports he is constantly worrying and thinking throughout the day as he is mostly homebound. This causes increased anxiety and alterations of mood. Denies suicidal ideations, homicidal ideations, or auditory hallucinations.   Social History   Socioeconomic History  . Marital status: Married    Spouse name: Not on file  . Number of children: Not on file  . Years of education: Not on file  . Highest education level: Not on file  Occupational History  . Occupation: Retired  Scientific laboratory technician  . Financial resource strain: Not on file  . Food insecurity:    Worry: Not on file    Inability: Not on file  . Transportation needs:    Medical: Not on file    Non-medical: Not on file  Tobacco Use  . Smoking status: Former Smoker    Years: 47.00    Types: Pipe    Last attempt to quit: 08/25/2013    Years since quitting: 5.3  . Smokeless tobacco: Former Systems developer    Types: Chew  . Tobacco comment: 5-6 times smoking a pipe  Substance and Sexual Activity  .  Alcohol use: Yes    Alcohol/week: 14.0 standard drinks    Types: 14 Cans of beer per week    Comment: 2 cans daily  . Drug use: No  . Sexual activity: Yes    Birth control/protection: None  Lifestyle  . Physical activity:    Days per week: Not on file    Minutes per session: Not on file  . Stress: Not on file  Relationships  . Social connections:    Talks on phone: Not on file    Gets together: Not on file    Attends religious service: Not on file    Active member of club or organization: Not on file    Attends meetings of clubs or organizations: Not on file    Relationship status: Not on file  . Intimate partner violence:    Fear of current or ex partner: Not on file    Emotionally abused: Not on file    Physically abused: Not on file    Forced sexual activity: Not on file  Other Topics Concern  . Not on file  Social History Narrative  . Not on file    Family History  Problem Relation Age of Onset  . Alzheimer's disease Father 4       Deceased  . Stomach cancer Father   . Heart attack Father   . Heart disease Father   . Skin cancer Mother        Facial-Living  . Alcohol abuse Sister        x2  . Mental illness Sister  x2  . Alcohol abuse Sister   . Diabetes Maternal Aunt        x2  . Thyroid disease Maternal Aunt        x4  . Diabetes Maternal Uncle   . Tuberculosis Paternal Grandfather   . Tuberculosis Paternal Grandmother   . Alzheimer's disease Paternal Aunt   . Alzheimer's disease Paternal Uncle   . Colon cancer Neg Hx   . Colon polyps Neg Hx   . Crohn's disease Neg Hx   . Ulcerative colitis Neg Hx   . Allergic rhinitis Neg Hx   . Angioedema Neg Hx   . Asthma Neg Hx   . Eczema Neg Hx   . Urticaria Neg Hx    Review of Systems  Pertinent negatives listed in HPI  Patient Active Problem List   Diagnosis Date Noted  . COPD with acute exacerbation ([place]) 12/04/2018  . PSVT (paroxysmal supraventricular tachycardia) ([place]) 09/23/2018  . Seasonal  and perennial allergic rhinitis 07/30/2018  . COPD with asthma ([place]) 07/07/2018  . S/P arthroscopy of right shoulder 04/25/2017  . Generalized anxiety disorder 04/22/2017  . Severe persistent asthma without complication 76/73/4193  . Bee sting-induced anaphylaxis 02/12/2017  . Anaphylactic reaction due to food, initial encounter 02/12/2017  . Current use of beta blocker 02/12/2017  . Allergic rhinitis 01/15/2017  . COPD exacerbation ([place]) 11/04/2016  . Asthma exacerbation 11/03/2016  . Nausea without vomiting 09/17/2016  . Shortness of breath   . Diabetes mellitus type 2 in obese ([place]) 06/26/2016  . non-specific Chest pain 06/12/2016  . Patient's noncompliance with other medical treatment and regimen 06/02/2016  . Hyperlipidemia, mixed 06/02/2016  . Elevated liver enzymes  06/02/2016  . Schizophrenia ([place]) 04/30/2016  . Environmental and seasonal allergies 04/30/2016  . Diabetes mellitus without complication- (diet controlled; w/o proteinuria) 04/30/2016  . Heavy alcohol use- Many yrs  04/30/2016  . Basal cell carcinoma of back 04/30/2016  . Obesity 04/30/2016  . Hx of multiple concussions 04/30/2016  . h/o Vitamin D deficiency 04/30/2016  . Screen for sexually transmitted diseases 04/30/2016  . Immunocompromised state ([place]) 04/30/2016  . Cervical radiculopathy 01/15/2016  . Alcohol withdrawal ([place]) 11/22/2015  . Alcohol abuse with alcohol-induced mood disorder ([place]) 11/22/2015  . h/o Suicidal thoughts 11/21/2015  . Dry mouth 10/07/2015  . Anxiety 09/19/2015  . OAB (overactive bladder) 07/27/2015  . Chronic idiopathic constipation 05/08/2015  . Screening for colon cancer   . Benign neoplasm of cecum   . Benign neoplasm of ascending colon   . Benign neoplasm of transverse colon   . Benign neoplasm of sigmoid colon   . Rectal polyp   . Gastroesophageal reflux disease without esophagitis   . Esophageal stricture   . Joint stiffness of multiple sites 10/17/2014  . Benign  localized hyperplasia of prostate with urinary obstruction 09/29/2014  . concerns for memory loss 09/13/2014  . Tuberculosis   . Diverticulum of bladder 06/17/2014  . Acute urinary retention 05/31/2014  . Neuropathy ([place]) 04/11/2014  . Post-polio syndrome 01/13/2014  . Other emphysema ([place]) 01/13/2014  . Hypertension 01/13/2014  . Former smoker 01/13/2014  . h/o Prostate cancer 01/13/2014  . History of tuberculosis 01/13/2014  . Bipolar disorder ([place]) 01/13/2014  . Explosive personality disorder ([place]) 01/13/2014  . Erectile dysfunction 01/13/2014  . Nephrolithiasis 01/13/2014  . Chronic pain 01/13/2014  . Obstructive sleep apnea 01/13/2014  . Alcoholic gastritis 79/11/4095  . Nocturnal hypoxia 08/10/2013    Allergies  Allergen Reactions  . Aspirin  Anaphylaxis  . [place] Venom Anaphylaxis  . Ivp Dye [Iodinated Diagnostic Agents] Anaphylaxis  . Levaquin [Levofloxacin In D5w] Shortness Of Breath and Swelling    In addition: sweating, chest pain, and diarrhea.   . Nsaids Anaphylaxis  . Penicillins Anaphylaxis    Heart stops Has patient had a PCN reaction causing immediate rash, facial/tongue/throat swelling, SOB or lightheadedness with hypotension: yes Has patient had a PCN reaction causing severe rash involving mucus membranes or skin necrosis: NO Has patient had a PCN reaction that required hospitalization yes Has patient had a PCN reaction occurring within the last 10 years: No If all of the above answers are "NO", then may proceed with Cephalosporin use.   . Tolmetin Anaphylaxis  . Fasenra [Benralizumab] Hives  . Perforomist [Formoterol]     Increased wheezing, shortness of breath  . Buprenorphine Hcl Nausea And Vomiting    Can take with zofran   . Morphine And Related Nausea And Vomiting    Can take with zofran   . Oxycodone Itching and Rash    Prior to Admission medications   Medication Sig Start Date End Date Taking? Authorizing Provider  acetaminophen (TYLENOL)  500 MG tablet Take 500 mg by mouth every 6 (six) hours as needed for headache (pain).    [provider]  albuterol (PROAIR HFA) 108 (90 Base) MCG/ACT inhaler Inhale 2 puffs into the lungs every 4 (four) hours as needed for wheezing or shortness of breath. 06/09/18   Valentina Shaggy, MD  albuterol (PROVENTIL) (2.5 MG/3ML) 0.083% nebulizer solution Take 2.5 mg by nebulization every 6 (six) hours as needed for wheezing or shortness of breath.    [provider]  azithromycin (ZITHROMAX) 250 MG tablet Take 1 tablet (250 mg total) by mouth daily. 12/05/18   Patrecia Pour, MD  baclofen (LIORESAL) 10 MG tablet Take 1 tablet (10 mg total) by mouth daily as needed for muscle spasms. 12/10/18   [name], FNP  bisoprolol (ZEBETA) 5 MG tablet Take 1 tablet (5 mg total) by mouth daily. 12/10/18   [name], FNP  budesonide (PULMICORT) 0.25 MG/2ML nebulizer solution Take 2 mLs (0.25 mg total) by nebulization 2 (two) times daily. 05/30/18   Valentina Shaggy, MD  budesonide-formoterol [facility]) 160-4.5 MCG/ACT inhaler Two puffs with spacer twice a day to prevent cough or wheeze.  Rinse, gargle and spit after use. 12/26/18   Valentina Shaggy, MD  EPINEPHrine 0.3 mg/0.3 mL IJ SOAJ injection Inject 0.3 mLs (0.3 mg total) into the muscle once. Patient taking differently: Inject 0.3 mg into the muscle once as needed (severe allergic reactions).  02/24/16   Malvin Johns, MD  esomeprazole (NEXIUM) 40 MG capsule Take 40 mg by mouth daily at 12 noon.    [provider]  fluticasone (FLONASE) 50 MCG/ACT nasal spray Place 1 spray into both nostrils daily as needed. Patient taking differently: Place 1 spray into both nostrils daily as needed for allergies.  05/30/18   Valentina Shaggy, MD  ipratropium-albuterol (DUONEB) 0.5-2.5 (3) MG/3ML SOLN One vial in nebulizer every 4-6 hours as needed for cough or wheeze. 12/26/18   Valentina Shaggy, MD  loratadine  (CLARITIN) 10 MG tablet Take 1 tablet (10 mg total) by mouth daily as needed for allergies. 12/10/18   [name], FNP  Menthol, Topical Analgesic, (BIOFREEZE EX) Apply 1 application topically daily as needed (knee pain). TO THE LEFT KNEE AS NEEDED FOR PAIN     [provider]  nitroGLYCERIN (NITROSTAT) 0.4 MG SL tablet Place 1 tablet (0.4 mg total) under the tongue every 5 (five) minutes as needed for chest pain. 06/21/15   Brunetta Jeans, PA-C  OXYGEN Inhale 2 L into the lungs at bedtime. DURING THE DAY IF NEEDED Reported on 11/25/2015    [provider]  Vitamin D, Ergocalciferol, (DRISDOL) 50000 units CAPS capsule Take 50,000 Units by mouth every 14 (fourteen) days.     [provider]    Past Medical, Surgical Family and Social History reviewed and updated.    Objective:   Today's Vitals   01/09/19 0830  Height: 5\' 10"  (1.778 m)    Wt Readings from Last 3 Encounters:  12/10/18 229 lb 6.4 oz (104.1 kg)  12/05/18 230 lb 6.4 oz (104.5 kg)  10/30/18 229 lb (103.9 kg)   Physical Exam General appearance: alert, well developed, well nourished, cooperative and in no distress Head: Normocephalic, without obvious abnormality, atraumatic Respiratory: Respirations even and unlabored, normal respiratory rate Heart: rate and rhythm normal. No gallop or murmurs noted on exam  Abdomen: BS +, no distention, no rebound tenderness, or no mass Extremities: No gross deformities Skin: Skin color, texture, turgor normal. No rashes seen  Psych: Appropriate mood and affect. Neurologic: Mental status: Alert, oriented to person, place, and time, thought content appropriate.   Assessment & Plan:  1. Type 2 diabetes mellitus with other specified complication, without long-term current use of insulin (HCC) Fasting glucose today is 123.  No recent A1c on file.  Patient is unable to urinate therefore will discontinue order for microalbumin.  We will repeat an A1c if elevated  will consider starting a medication.  Diabetes is likely secondary to chronic ongoing use of prednisone. - Comprehensive metabolic panel - Hemoglobin A1c - Glucose (CBG)  2. Essential hypertension, controlled today Encouraged take medications as prescribed as increasing excessive use of albuterol can increase heart rate, blood pressure worsening anxiety. Reduce alcohol consumption No changes in current dose of bisoprolol.  3. Chronic obstructive pulmonary disease, unspecified COPD type ([place]) -She is scheduled to follow-up with pulmonology next week.  He continues to complain of worsening shortness of breath.  In review of Dr. Gillermina Hu prior regimen prescribed for patient he was to be on a twice daily low-dose of prednisone.  He is currently not taking the low-dose prednisone therefore I will re-prescribe as patient is complaining of worsening dyspnea.  He was advised to begin 5 mg twice daily of prednisone. His respiratory exam today was unremarkable.  Breath sounds were normal.  Work of breathing was normal.  Patient was able to speak clearly in complete sentences.  No evidence of hypoxia. - CBC with Differential  4. Supplemental oxygen dependent -Encourage oxygen use at the onset of shortness of breath.  Also encouraged patient to increase oxygen level to 2.5 or 3 L during the nighttime hours if his shortness of breath worsens.   -The patient was given clear instructions to go to ER or return to medical center if symptoms do not improve, worsen or new problems develop. The patient verbalized understanding.    Molli Barrows, FNP Primary Care at [facility] [address] [place] [phone]: [phone]

## 2019-01-09 NOTE — Telephone Encounter (Signed)
Follow Up:; ° ° °Returning your call. °

## 2019-01-09 NOTE — Telephone Encounter (Signed)
Patient was aware of referral,although, I was not. This would be very beneficial to the patient, therefore do not delete referral.

## 2019-01-09 NOTE — Telephone Encounter (Signed)
Provider would like to proceed with referral. Thanks.

## 2019-01-09 NOTE — Telephone Encounter (Signed)
Phone call placed to patient. Spoke with patient's wife who stated that patient was not at home at this time.

## 2019-01-09 NOTE — Telephone Encounter (Signed)
Called Stacy Bowlin([phone]) to follow up on referral that was entered. She states that the referral was initiated by the case manager from patient's hospital visit in February 2020. She states that a NP would go out to the house to manage medications & monitor his COPD. She states that the referral doesn't have to be for a patient with a 6 month prognosis & the NP that makes the house calls would not replace patient's regular PCP. She states that she called our office on 01/08/2019 & spoke with [facility] who gave the verbal order. If PCP is not on board referral can be deleted.

## 2019-01-09 NOTE — Patient Instructions (Addendum)
I prescribed you hydroxyzine 25 mg 3 times daily as needed for anxiety I am restarting you back on your daily prednisone milligrams twice daily as previously prescribed by Dr. Ernst Bowler. Hartness of breath occurring at nighttime I do recommend increasing your oxygen to 2-1/2 L and or 3 L if needed please discussed this nighttime air hunger with pulmonology at your follow-up.    Shortness of Breath, Adult Shortness of breath means you have trouble breathing. Shortness of breath could be a sign of a medical problem. Follow these instructions at home:   Watch for any changes in your symptoms.  Do not use any products that contain nicotine or tobacco, such as cigarettes, e-cigarettes, and chewing tobacco.  Do not smoke. Smoking can cause shortness of breath. If you need help to quit smoking, ask your doctor.  Avoid things that can make it harder to breathe, such as: ? Mold. ? Dust. ? Air pollution. ? Chemical smells. ? Things that can cause allergy symptoms (allergens), if you have allergies.  Keep your living space clean. Use products that help remove mold and dust.  Rest as needed. Slowly return to your normal activities.  Take over-the-counter and prescription medicines only as told by your doctor. This includes oxygen therapy and inhaled medicines.  Keep all follow-up visits as told by your doctor. This is important. Contact a doctor if:  Your condition does not get better as soon as expected.  You have a hard time doing your normal activities, even after you rest.  You have new symptoms. Get help right away if:  Your shortness of breath gets worse.  You have trouble breathing when you are resting.  You feel light-headed or you pass out (faint).  You have a cough that is not helped by medicines.  You cough up blood.  You have pain with breathing.  You have pain in your chest, arms, shoulders, or belly (abdomen).  You have a fever.  You cannot walk up  stairs.  You cannot exercise the way you normally do. These symptoms may represent a serious problem that is an emergency. Do not wait to see if the symptoms will go away. Get medical help right away. Call your local emergency services (911 in the [place] Do not drive yourself to the hospital. Summary  Shortness of breath is when you have trouble breathing enough air. It can be a sign of a medical problem.  Avoid things that make it hard for you to breathe, such as smoking, pollution, mold, and dust.  Watch for any changes in your symptoms. Contact your doctor if you do not get better or you get worse. This information is not intended to replace advice given to you by your health care provider. Make sure you discuss any questions you have with your health care provider. Document Released: 03/19/2008 Document Revised: 03/03/2018 Document Reviewed: 03/03/2018 Elsevier Interactive Patient Education  2019 [employer].

## 2019-01-09 NOTE — Telephone Encounter (Addendum)
TELEPHONE CALL NOTE  Spoke with pt who states off and on he's been experiencing chest pain. He report he was told that he has anginal pain but it's getting worse. Pt report last episode was last mon while he was in bed. Pt states he does have asthma and COPD and doesn't know if it could be related to that. He denies current chest pain and voiced understanding to report to ED if symptoms reoccur. Pt set up for virtual appointment on 4/1 at 120 but will defer to provider for recommendations.   [name] has been deemed a candidate for a follow-up tele-health visit to limit community exposure during the Covid-19 pandemic. I spoke with the patient via phone to ensure availability of phone/video source, confirm preferred email & phone number, and discuss instructions and expectations.  I reminded [name] to be prepared with any vital sign and/or heart rhythm information that could potentially be obtained via home monitoring, at the time of his visit. I reminded [name] to expect a phone call at the time of his visit if his visit.  Did the patient verbally acknowledge consent to treatment? YES  [name], RN 01/09/2019 4:37 PM    DOWNLOADING THE [place], go to [employer] and type in WebEx in the search bar. [place] [employer], the blue/green circle. The app is free but as with any other app downloads, their phone may require them to verify saved payment information or Apple password. The patient does NOT have to create an account.  - If Android, ask patient to go to [employer] and type in WebEx in the search bar. [place] [employer], the blue/green circle. The app is free but as with any other app downloads, their phone may require them to verify saved payment information or Android password. The patient does NOT have to create an account.   CONSENT FOR TELE-HEALTH VISIT - PLEASE REVIEW  I hereby voluntarily  request, consent and authorize [facility] and its employed or contracted physicians, physician assistants, nurse practitioners or other licensed health care professionals (the Practitioner), to provide me with telemedicine health care services (the "Services") as deemed necessary by the treating Practitioner. I acknowledge and consent to receive the Services by the Practitioner via telemedicine. I understand that the telemedicine visit will involve communicating with the Practitioner through live audiovisual communication technology and the disclosure of certain medical information by electronic transmission. I acknowledge that I have been given the opportunity to request an in-person assessment or other available alternative prior to the telemedicine visit and am voluntarily participating in the telemedicine visit.  I understand that I have the right to withhold or withdraw my consent to the use of telemedicine in the course of my care at any time, without affecting my right to future care or treatment, and that the Practitioner or I may terminate the telemedicine visit at any time. I understand that I have the right to inspect all information obtained and/or recorded in the course of the telemedicine visit and may receive copies of available information for a reasonable fee.  I understand that some of the potential risks of receiving the Services via telemedicine include:  Marland Kitchen Delay or interruption in medical evaluation due to technological equipment failure or disruption; . Information transmitted may not be sufficient (e.g. poor resolution of images) to allow for appropriate medical decision making by the Practitioner; and/or  . In rare  instances, security protocols could fail, causing a breach of personal health information.  Furthermore, I acknowledge that it is my responsibility to provide information about my medical history, conditions and care that is complete and accurate to the best of my  ability. I acknowledge that Practitioner's advice, recommendations, and/or decision may be based on factors not within their control, such as incomplete or inaccurate data provided by me or distortions of diagnostic images or specimens that may result from electronic transmissions. I understand that the practice of medicine is not an exact science and that Practitioner makes no warranties or guarantees regarding treatment outcomes. I acknowledge that I will receive a copy of this consent concurrently upon execution via email to the email address I last provided but may also request a printed copy by calling the office of [name].    I understand that my insurance will be billed for this visit.   I have read or had this consent read to me. . I understand the contents of this consent, which adequately explains the benefits and risks of the Services being provided via telemedicine.  . I have been provided ample opportunity to ask questions regarding this consent and the Services and have had my questions answered to my satisfaction. . I give my informed consent for the services to be provided through the use of telemedicine in my medical care  By participating in this telemedicine visit I agree to the above.

## 2019-01-10 LAB — COMPREHENSIVE METABOLIC PANEL
ALT: 47 IU/L — AB (ref 0–44)
AST: 32 IU/L (ref 0–40)
Albumin/Globulin Ratio: 1.6 (ref 1.2–2.2)
Albumin: 4.2 g/dL (ref 3.8–4.8)
Alkaline Phosphatase: 48 IU/L (ref 39–117)
BUN/Creatinine Ratio: 6 — ABNORMAL LOW (ref 10–24)
BUN: 6 mg/dL — AB (ref 8–27)
Bilirubin Total: 0.4 mg/dL (ref 0.0–1.2)
CO2: 20 mmol/L (ref 20–29)
Calcium: 9.1 mg/dL (ref 8.6–10.2)
Chloride: 98 mmol/L (ref 96–106)
Creatinine, Ser: 1.08 mg/dL (ref 0.76–1.27)
GFR calc Af Amer: 81 mL/min/{1.73_m2} (ref >59)
GFR calc non Af Amer: 70 mL/min/{1.73_m2} (ref >59)
Globulin, Total: 2.6 g/dL (ref 1.5–4.5)
Glucose: 109 mg/dL — ABNORMAL HIGH (ref 65–99)
Potassium: 4.1 mmol/L (ref 3.5–5.2)
Sodium: 136 mmol/L (ref 134–144)
Total Protein: 6.8 g/dL (ref 6.0–8.5)

## 2019-01-10 LAB — LIPID PANEL
Chol/HDL Ratio: 2.7 ratio (ref 0.0–5.0)
Cholesterol, Total: 234 mg/dL — ABNORMAL HIGH (ref 100–199)
HDL: 86 mg/dL (ref >39)
LDL Calculated: 122 mg/dL — ABNORMAL HIGH (ref 0–99)
Triglycerides: 131 mg/dL (ref 0–149)
VLDL Cholesterol Cal: 26 mg/dL (ref 5–40)

## 2019-01-10 LAB — CBC WITH DIFFERENTIAL/PLATELET
Basophils Absolute: 0.1 10*3/uL (ref 0.0–0.2)
Basos: 1 %
EOS (ABSOLUTE): 0.4 10*3/uL (ref 0.0–0.4)
Eos: 4 %
Hematocrit: 42.3 % (ref 37.5–51.0)
Hemoglobin: 14.4 g/dL (ref 13.0–17.7)
Immature Grans (Abs): 0.1 10*3/uL (ref 0.0–0.1)
Immature Granulocytes: 1 %
Lymphocytes Absolute: 1.6 10*3/uL (ref 0.7–3.1)
Lymphs: 19 %
MCH: 31.9 pg (ref 26.6–33.0)
MCHC: 34 g/dL (ref 31.5–35.7)
MCV: 94 fL (ref 79–97)
MONOS ABS: 0.4 10*3/uL (ref 0.1–0.9)
Monocytes: 5 %
NEUTROS PCT: 70 %
Neutrophils Absolute: 5.8 10*3/uL (ref 1.4–7.0)
Platelets: 249 10*3/uL (ref 150–450)
RBC: 4.51 x10E6/uL (ref 4.14–5.80)
RDW: 13.1 % (ref 11.6–15.4)
WBC: 8.3 10*3/uL (ref 3.4–10.8)

## 2019-01-10 LAB — HEMOGLOBIN A1C
ESTIMATED AVERAGE GLUCOSE: 148 mg/dL
Hgb A1c MFr Bld: 6.8 % — ABNORMAL HIGH (ref 4.8–5.6)

## 2019-01-13 ENCOUNTER — Ambulatory Visit: Payer: Medicare HMO | Admitting: Cardiology

## 2019-01-13 NOTE — Progress Notes (Signed)
Patient notified of results & recommendations. Expressed understanding.

## 2019-01-14 ENCOUNTER — Telehealth: Payer: Self-pay

## 2019-01-14 ENCOUNTER — Telehealth (INDEPENDENT_AMBULATORY_CARE_PROVIDER_SITE_OTHER): Payer: Medicare HMO | Admitting: Cardiology

## 2019-01-14 DIAGNOSIS — Z5329 Procedure and treatment not carried out because of patient's decision for other reasons: Secondary | ICD-10-CM

## 2019-01-14 NOTE — Progress Notes (Signed)
Patient did not keep scheduled appt. Information below prepared for visit  PMH diet-controlled type II diabetes, hypertension, hyperlipidemia, paroxysmal SVT who was referred by Molli Barrows, FNP for the evaluation of chest pain. He also has a significant lung history, including post-polio syndrome, COPD with asthma, chronic hypoxic respiratory failure requiring home O2, OSA, prior TB. Also has psychiatric comorbidities as noted.   He was last seen by cardiology in the hospital in 2017 during two admissions for lightheadedness and chest pain. Workup unremarkable (see below).   Cardiac history:  Lexiscan 2017: no ischemia, normal LV function. Did have nausea and shortness of breath, treated with aminophylline. Echo 2017: EF 55-60%, normal diastology, no significant valve disease Monitor 2017: sinus with occasional PVC Reported prior cath, I do not have records. (reported in chart as either 2007 or 2011)

## 2019-01-14 NOTE — Telephone Encounter (Signed)
Attempted to contact pt x 3 for scheduled virtual visit today 01/14/19 at 120 pm with Dr. Harrell Gave. Left message to call office back to either reschedule or speak with Nurse to be worked into the afternoon.

## 2019-01-15 ENCOUNTER — Telehealth: Payer: Self-pay | Admitting: Family Medicine

## 2019-01-15 NOTE — Telephone Encounter (Signed)
Pt called because the pharmacist told him that he shouldn't be taking hydrOXYzine (ATARAX/VISTARIL) 25 MG tablet [[id number]] and predniSONE (DELTASONE) 5 MG tablet [[id number]] together so he would like to know which medication he should be taking. Please follow up.

## 2019-01-15 NOTE — Telephone Encounter (Signed)
Patient notified of the information below. Expressed understanding.

## 2019-01-15 NOTE — Telephone Encounter (Signed)
I'm not sure why he was given misinformation. Those medications are compatible and are not contraindicated when taken together.

## 2019-01-15 NOTE — Telephone Encounter (Signed)
Please advise 

## 2019-01-28 ENCOUNTER — Telehealth: Payer: Self-pay

## 2019-01-28 NOTE — Telephone Encounter (Signed)
VM left for patient to offer to schedule visit with Palliative Care 

## 2019-01-28 NOTE — Telephone Encounter (Signed)
VM left for patient's wife to offer to schedule visit with Palliative care

## 2019-01-29 ENCOUNTER — Telehealth: Payer: Self-pay

## 2019-01-29 NOTE — Telephone Encounter (Signed)
Return VM received from patient. Attempted to contact patient. VM left

## 2019-02-04 ENCOUNTER — Telehealth: Payer: Self-pay

## 2019-02-04 NOTE — Telephone Encounter (Signed)
VM left to offer to schedule visit with Palliative Care 

## 2019-02-05 ENCOUNTER — Other Ambulatory Visit: Payer: Self-pay | Admitting: Allergy & Immunology

## 2019-02-06 ENCOUNTER — Ambulatory Visit: Payer: Self-pay | Admitting: Pulmonary Disease

## 2019-02-09 ENCOUNTER — Telehealth: Payer: Self-pay

## 2019-02-09 NOTE — Telephone Encounter (Signed)
Email sent to patient's wife to verify email address

## 2019-02-09 NOTE — Telephone Encounter (Signed)
Patient contacted for Palliative Care visit.  Due to the current COVID-19 infection/crises, the patient and family prefer, and have given their verbal consent for, a provider visit via telemedicine. HIPPA policies of confidentially were discussed and patient/family expressed understanding. Patient unsure of his wife's email to schedule a telehealth visit. Plan is to schedule a telephonic visit and to possibly change it to telehealth when he can obtain email. Noted the email in his chart and he consented for a test email to be sent. Patient was receptive to Palliative Care visit and shared that he is unsure of 'where he is going' with current diseases. Patient shared his health history and some concerns he has regarding medications. Will provide NP with an update on patient's concerns. Visit scheduled for Wednesday 02/11/2019 @ 12:30pm

## 2019-02-11 ENCOUNTER — Other Ambulatory Visit: Payer: Medicare HMO | Admitting: Internal Medicine

## 2019-02-11 ENCOUNTER — Other Ambulatory Visit: Payer: Self-pay

## 2019-02-11 DIAGNOSIS — Z515 Encounter for palliative care: Secondary | ICD-10-CM

## 2019-02-11 DIAGNOSIS — R0602 Shortness of breath: Secondary | ICD-10-CM

## 2019-02-13 NOTE — Progress Notes (Signed)
[facility] Consult Note Telephone: [phone]  Fax: [phone]  PATIENT NAME: [name] DOB: 04-17-1951 MRN: [id number]  PRIMARY CARE PROVIDER:   [name], FNP  REFERRING PROVIDER:  [name], FNP [address], [place] [address]  RESPONSIBLE PARTY:    self     RECOMMENDATIONS and PLAN:  Palliative Care Encounter  Z51.5  1.  Advance care planning:  Introduced palliative and hospice care.  Pt's goals are to avoid acute respiratory exacerbations and hospitalization.  He requests to "think about" decisions related to advanced directives. Re-address on future palliative appointment.  2.  Shortness of breath: Related to COPD.  Complete documents from Pulmonary provider to assist with obtaining respiratory meds otherwise use current supply of prescribed pulmonary meds and oxygen.  Avoid respiratory irritants.  Seek emergency treatment if needed  3.  Anxiety:  Continue Vistaril TID  Consider behavioral health counselor referral for evaluation.  Untreated Scizophrenia  Due to the COVID-19 crisis, this visit occurred telephonically from my office and was initiated and consented by patient and or family   I spent 45 minutes providing this consultation,  from 1230 to 1315. More than 50% of the time in this consultation was spent coordinating communication with the patient.                                                            Marland Kitchen   HISTORY OF PRESENT ILLNESS:  [name] is a 68 y.o. year old male with multiple medical problems including COPD with O2 dependency, severe allergies, anxiety and schizophrenia(untreated). He reports shortness of breath with exertion, visual and auditory hallucinations.. Palliative Care was asked to help address goals of care.   CODE STATUS: DNR  PPS: 50% HOSPICE ELIGIBILITY/DIAGNOSIS: TBD  PAST MEDICAL HISTORY:  Past Medical History:  Diagnosis Date  . Anxiety  disorder   . Arthritis    BILATERAL SHOULDERS, ELBOWS AND HANDS AND LEFT HIP AND KNEES--HX CORTISONE SHOTS IN SHOULDERS, ELBOWS, HIP AND KNEES  . Basal cell carcinoma of back 04/30/2016   Dermatologist- Dr Nevada Crane;   MOHS sx- Dr Levada Dy   . Bladder outlet obstruction   . Chronic idiopathic constipation 05/08/2015  . Complication of anesthesia    DIFFICULT WAKING   . COPD (chronic obstructive pulmonary disease) Gold C Frequent exacerbations 01/13/2014   [name] 07/08/14: FeV1 51% FeV1/FVC 66% FVC 59% 10/5/2015ONO RA was normal  10/13/2014  ONO on RA NORMAL   . COPD, frequent exacerbations ([place])    pulmologist-  dr Joya Gaskins--  Girtha Rm Stage C.04-25-15 recent COPD exacerbation-much improved now, after tx. in ER [facility].  Marland Kitchen Depression   . Diabetes mellitus without complication ([place])    BODERLINE - DIET CONTROL  . Dysrhythmia    PVC'S  . Emphysema lung ([place])    stage 2  . Family history of adverse reaction to anesthesia    father would wake up with agitation   . Former smoker 01/13/2014  . Gastroesophageal reflux disease without esophagitis   . Heavy alcohol use 04/30/2016  . History of chronic bronchitis   . History of oxygen administration    oxygen use 2 l/m nasally at bedtime and exertional occasions  . History of rheumatic fever   .  History of TB (tuberculosis)    1984--  hospitalized for 4 month treatment  . History of urinary retention   . Hx of multiple concussions    x 2 per patient   . Hypertension   . Melanoma ([place])   . Nocturnal oxygen desaturation    USES O2 NIGHTLY  . OSA (obstructive sleep apnea) 01/13/2014  . PONV (postoperative nausea and vomiting)   . Post-polio syndrome    polio at age 79--PT WAS IN IRON LUNG; PT WAS IN W/C UNTIL AGE 22; STILL HAS WEAKNESS RIGHT SIDE  . Prostate cancer ([place])   . Schizophrenia ([place])   . Tuberculosis    Hosp 4 months rx , left early   . Urticaria      ALLERGIES:  Allergies  Allergen Reactions  . Aspirin Anaphylaxis  . Hornet Venom  Anaphylaxis  . Ivp Dye [Iodinated Diagnostic Agents] Anaphylaxis  . Levaquin [Levofloxacin In D5w] Shortness Of Breath and Swelling    In addition: sweating, chest pain, and diarrhea.   . Nsaids Anaphylaxis  . Penicillins Anaphylaxis    Heart stops Has patient had a PCN reaction causing immediate rash, facial/tongue/throat swelling, SOB or lightheadedness with hypotension: yes Has patient had a PCN reaction causing severe rash involving mucus membranes or skin necrosis: NO Has patient had a PCN reaction that required hospitalization yes Has patient had a PCN reaction occurring within the last 10 years: No If all of the above answers are "NO", then may proceed with Cephalosporin use.   . Tolmetin Anaphylaxis  . Fasenra [Benralizumab] Hives  . Perforomist [Formoterol]     Increased wheezing, shortness of breath  . Buprenorphine Hcl Nausea And Vomiting    Can take with zofran   . Morphine And Related Nausea And Vomiting    Can take with zofran   . Oxycodone Itching and Rash     PERTINENT MEDICATIONS:  Outpatient Encounter Medications as of 02/11/2019  Medication Sig  . acetaminophen (TYLENOL) 500 MG tablet Take 500 mg by mouth every 6 (six) hours as needed for headache (pain).  Marland Kitchen albuterol (PROAIR HFA) 108 (90 Base) MCG/ACT inhaler Inhale 2 puffs into the lungs every 4 (four) hours as needed for wheezing or shortness of breath.  Marland Kitchen albuterol (PROVENTIL) (2.5 MG/3ML) 0.083% nebulizer solution Take 2.5 mg by nebulization every 6 (six) hours as needed for wheezing or shortness of breath.  . baclofen (LIORESAL) 10 MG tablet Take 1 tablet (10 mg total) by mouth daily as needed for muscle spasms.  . bisoprolol (ZEBETA) 5 MG tablet Take 1 tablet (5 mg total) by mouth daily.  . budesonide (PULMICORT) 0.25 MG/2ML nebulizer solution Take 2 mLs (0.25 mg total) by nebulization 2 (two) times daily.  . budesonide-formoterol (SYMBICORT) 160-4.5 MCG/ACT inhaler Two puffs with spacer twice a day to prevent  cough or wheeze.  Rinse, gargle and spit after use.  Marland Kitchen EPINEPHrine 0.3 mg/0.3 mL IJ SOAJ injection Inject 0.3 mLs (0.3 mg total) into the muscle once. (Patient taking differently: Inject 0.3 mg into the muscle once as needed (severe allergic reactions). )  . esomeprazole (NEXIUM) 40 MG capsule Take 40 mg by mouth daily at 12 noon.  . fluticasone (FLONASE) 50 MCG/ACT nasal spray Place 1 spray into both nostrils daily as needed. (Patient taking differently: Place 1 spray into both nostrils daily as needed for allergies. )  . hydrOXYzine (ATARAX/VISTARIL) 25 MG tablet Take 1 tablet (25 mg total) by mouth 3 (three) times daily as needed.  Marland Kitchen  ipratropium-albuterol (DUONEB) 0.5-2.5 (3) MG/3ML SOLN USE 1 VIAL IN THE NEBULIZER EVERY 4-6 HOURS AS NEEDED FOR COUGH OR WHEEZE  . loratadine (CLARITIN) 10 MG tablet Take 1 tablet (10 mg total) by mouth daily as needed for allergies.  . Menthol, Topical Analgesic, (BIOFREEZE EX) Apply 1 application topically daily as needed (knee pain). TO THE LEFT KNEE AS NEEDED FOR PAIN   . nitroGLYCERIN (NITROSTAT) 0.4 MG SL tablet Place 1 tablet (0.4 mg total) under the tongue every 5 (five) minutes as needed for chest pain.  . OXYGEN Inhale 2 L into the lungs at bedtime. DURING THE DAY IF NEEDED Reported on 11/25/2015  . predniSONE (DELTASONE) 5 MG tablet Take 1 tablet (5 mg total) by mouth 2 (two) times daily with a meal.  . Vitamin D, Ergocalciferol, (DRISDOL) 50000 units CAPS capsule Take 50,000 Units by mouth every 14 (fourteen) days.    No facility-administered encounter medications on file as of 02/11/2019.     PHYSICAL EXAM:   General: NAD Cardiovascular: unable to assess Pulmonary: speech is not forced Neurological: Alert and oriented x3,  Psych:  Very talkative  Gonzella Lex, NP

## 2019-03-25 ENCOUNTER — Telehealth: Payer: Self-pay

## 2019-03-25 NOTE — Telephone Encounter (Signed)
Received phone call from patient requesting a follow up visit to be scheduled. Patient shared concerns that he was diagnosed with Schizophrenia and is not sure how and why. He does admit to having visual and auditory hallucinations. He shared that he does not always recognize that the things he sees and hears are not really there. He states that his wife has seen him talking to someone that is not there. He denies that the 'voices' do not tell him to do anything. Visit scheduled for Tuesday 03/31/2019.

## 2019-03-31 ENCOUNTER — Other Ambulatory Visit: Payer: Medicare HMO | Admitting: Internal Medicine

## 2019-03-31 ENCOUNTER — Other Ambulatory Visit: Payer: Self-pay

## 2019-03-31 DIAGNOSIS — R0602 Shortness of breath: Secondary | ICD-10-CM

## 2019-03-31 DIAGNOSIS — Z515 Encounter for palliative care: Secondary | ICD-10-CM

## 2019-03-31 NOTE — Progress Notes (Unsigned)
[facility] Consult Note Telephone: [phone]  Fax: [phone]  PATIENT NAME: [name] DOB: May 29, 1951 MRN: [id number]  PRIMARY CARE PROVIDER:   [name], FNP  REFERRING PROVIDER:  [name], FNP [address] [place],  [place] [id number]  RESPONSIBLE PARTY:     ASSESSMENT:        RECOMMENDATIONS and PLAN:  1.  I spent *** minutes providing this consultation,  from *** to ***. More than 50% of the time in this consultation was spent coordinating communication.   HISTORY OF PRESENT ILLNESS:  [name] is a 68 y.o. year old male with multiple medical problems including ***. Palliative Care was asked to help address goals of care.   CODE STATUS:   PPS: 0% HOSPICE ELIGIBILITY/DIAGNOSIS: TBD  PAST MEDICAL HISTORY:  Past Medical History:  Diagnosis Date  . Anxiety disorder   . Arthritis    BILATERAL SHOULDERS, ELBOWS AND HANDS AND LEFT HIP AND KNEES--HX CORTISONE SHOTS IN SHOULDERS, ELBOWS, HIP AND KNEES  . Basal cell carcinoma of back 04/30/2016   Dermatologist- Dr Nevada Crane;   MOHS sx- Dr Levada Dy   . Bladder outlet obstruction   . Chronic idiopathic constipation 05/08/2015  . Complication of anesthesia    DIFFICULT WAKING   . COPD (chronic obstructive pulmonary disease) Gold C Frequent exacerbations 01/13/2014   [name] 07/08/14: FeV1 51% FeV1/FVC 66% FVC 59% 10/5/2015ONO RA was normal  10/13/2014  ONO on RA NORMAL   . COPD, frequent exacerbations ([place])    pulmologist-  dr Joya Gaskins--  Girtha Rm Stage C.04-25-15 recent COPD exacerbation-much improved now, after tx. in ER [facility].  Marland Kitchen Depression   . Diabetes mellitus without complication ([place])    BODERLINE - DIET CONTROL  . Dysrhythmia    PVC'S  . Emphysema lung ([place])    stage 2  . Family history of adverse reaction to anesthesia    father would wake up with agitation   . Former smoker 01/13/2014  . Gastroesophageal reflux disease without  esophagitis   . Heavy alcohol use 04/30/2016  . History of chronic bronchitis   . History of oxygen administration    oxygen use 2 l/m nasally at bedtime and exertional occasions  . History of rheumatic fever   . History of TB (tuberculosis)    1984--  hospitalized for 4 month treatment  . History of urinary retention   . Hx of multiple concussions    x 2 per patient   . Hypertension   . Melanoma ([place])   . Nocturnal oxygen desaturation    USES O2 NIGHTLY  . OSA (obstructive sleep apnea) 01/13/2014  . PONV (postoperative nausea and vomiting)   . Post-polio syndrome    polio at age 46--PT WAS IN IRON LUNG; PT WAS IN W/C UNTIL AGE 26; STILL HAS WEAKNESS RIGHT SIDE  . Prostate cancer ([place])   . Schizophrenia ([place])   . Tuberculosis    Hosp 4 months rx , left early   . Urticaria     SOCIAL HX:  Social History   Tobacco Use  . Smoking status: Former Smoker    Years: 47.00    Types: Pipe    Quit date: 08/25/2013    Years since quitting: 5.6  . Smokeless tobacco: Former Systems developer    Types: Chew  . Tobacco comment: 5-6 times smoking a pipe  Substance Use Topics  . Alcohol use: Yes    Alcohol/week: 14.0 standard drinks  Types: 14 Cans of beer per week    Comment: 2 cans daily    ALLERGIES:  Allergies  Allergen Reactions  . Aspirin Anaphylaxis  . Hornet Venom Anaphylaxis  . Ivp Dye [Iodinated Diagnostic Agents] Anaphylaxis  . Levaquin [Levofloxacin In D5w] Shortness Of Breath and Swelling    In addition: sweating, chest pain, and diarrhea.   . Nsaids Anaphylaxis  . Penicillins Anaphylaxis    Heart stops Has patient had a PCN reaction causing immediate rash, facial/tongue/throat swelling, SOB or lightheadedness with hypotension: yes Has patient had a PCN reaction causing severe rash involving mucus membranes or skin necrosis: NO Has patient had a PCN reaction that required hospitalization yes Has patient had a PCN reaction occurring within the last 10 years: No If all of the  above answers are "NO", then may proceed with Cephalosporin use.   . Tolmetin Anaphylaxis  . Fasenra [Benralizumab] Hives  . Perforomist [Formoterol]     Increased wheezing, shortness of breath  . Buprenorphine Hcl Nausea And Vomiting    Can take with zofran   . Morphine And Related Nausea And Vomiting    Can take with zofran   . Oxycodone Itching and Rash     PERTINENT MEDICATIONS:  Outpatient Encounter Medications as of 01/08/2019  Medication Sig  . acetaminophen (TYLENOL) 500 MG tablet Take 500 mg by mouth every 6 (six) hours as needed for headache (pain).  Marland Kitchen albuterol (PROAIR HFA) 108 (90 Base) MCG/ACT inhaler Inhale 2 puffs into the lungs every 4 (four) hours as needed for wheezing or shortness of breath.  Marland Kitchen albuterol (PROVENTIL) (2.5 MG/3ML) 0.083% nebulizer solution Take 2.5 mg by nebulization every 6 (six) hours as needed for wheezing or shortness of breath.  . baclofen (LIORESAL) 10 MG tablet Take 1 tablet (10 mg total) by mouth daily as needed for muscle spasms.  . bisoprolol (ZEBETA) 5 MG tablet Take 1 tablet (5 mg total) by mouth daily.  . budesonide (PULMICORT) 0.25 MG/2ML nebulizer solution Take 2 mLs (0.25 mg total) by nebulization 2 (two) times daily.  . budesonide-formoterol (SYMBICORT) 160-4.5 MCG/ACT inhaler Two puffs with spacer twice a day to prevent cough or wheeze.  Rinse, gargle and spit after use.  Marland Kitchen EPINEPHrine 0.3 mg/0.3 mL IJ SOAJ injection Inject 0.3 mLs (0.3 mg total) into the muscle once. (Patient taking differently: Inject 0.3 mg into the muscle once as needed (severe allergic reactions). )  . esomeprazole (NEXIUM) 40 MG capsule Take 40 mg by mouth daily at 12 noon.  . fluticasone (FLONASE) 50 MCG/ACT nasal spray Place 1 spray into both nostrils daily as needed. (Patient taking differently: Place 1 spray into both nostrils daily as needed for allergies. )  . loratadine (CLARITIN) 10 MG tablet Take 1 tablet (10 mg total) by mouth daily as needed for allergies.   . Menthol, Topical Analgesic, (BIOFREEZE EX) Apply 1 application topically daily as needed (knee pain). TO THE LEFT KNEE AS NEEDED FOR PAIN   . nitroGLYCERIN (NITROSTAT) 0.4 MG SL tablet Place 1 tablet (0.4 mg total) under the tongue every 5 (five) minutes as needed for chest pain.  . OXYGEN Inhale 2 L into the lungs at bedtime. DURING THE DAY IF NEEDED Reported on 11/25/2015  . Vitamin D, Ergocalciferol, (DRISDOL) 50000 units CAPS capsule Take 50,000 Units by mouth every 14 (fourteen) days.   . [DISCONTINUED] azithromycin (ZITHROMAX) 250 MG tablet Take 1 tablet (250 mg total) by mouth daily.  . [DISCONTINUED] ipratropium-albuterol (DUONEB) 0.5-2.5 (3) MG/3ML SOLN  One vial in nebulizer every 4-6 hours as needed for cough or wheeze.   No facility-administered encounter medications on file as of 01/08/2019.     PHYSICAL EXAM:   General: NAD, frail appearing, thin Cardiovascular: regular rate and rhythm Pulmonary: clear ant fields Abdomen: soft, nontender, + bowel sounds GU: no suprapubic tenderness Extremities: no edema, no joint deformities Skin: no rashes Neurological: Weakness but otherwise nonfocal  Gonzella Lex, NP

## 2019-03-31 NOTE — Progress Notes (Addendum)
[facility] Consult Note Telephone: [phone]  Fax: [phone]  PATIENT NAME: [name] DOB: 21-Mar-1951 MRN: [id number]  PRIMARY CARE PROVIDER:   [name], FNP  REFERRING PROVIDER:  [name], FNP [id number],  [place] [id number]  RESPONSIBLE PARTY:   Self     RECOMMENDATIONS and PLAN:   Palliative Care Encounter Z51.5  1.   Advance care planning:  Pt. States that he would like to continue to receive necessary treatments of any health issue.  He does not desire intubation  Palliative care will f/u in near future.  2.   Shortness of breath:  Chronic related to COPD without reported exacerbations.  Continue prescribed bronchodilator inhalers and neb treatments.  He declines to return to Pulmonologist.  Monitor  3. Anxiety:  Continues intermittently.  More associated when short of breath.  Continue management of COPD. Behavioral health evaluation still recommended due to hallucinations  I spent 30 minutes providing this consultation,  from 1300 to 1330 More than 50% of the time in this consultation was spent coordinating communication with patient   Due to the COVID-19 crisis, this visit was completed via phone and was initiated and consented by patient and or family member.  HISTORY OF PRESENT ILLNESS:  Follow-up with [name].  He reports no exacerbations. No f/u with Pulmonology since last visit.  Palliative Care was asked to help address goals of care.   CODE STATUS: DNR  PPS: 50% HOSPICE ELIGIBILITY/DIAGNOSIS: TBD  PAST MEDICAL HISTORY:  Past Medical History:  Diagnosis Date  . Anxiety disorder   . Arthritis    BILATERAL SHOULDERS, ELBOWS AND HANDS AND LEFT HIP AND KNEES--HX CORTISONE SHOTS IN SHOULDERS, ELBOWS, HIP AND KNEES  . Basal cell carcinoma of back 04/30/2016   Dermatologist- Dr Nevada Crane;   MOHS sx- Dr Levada Dy   . Bladder outlet obstruction   . Chronic idiopathic  constipation 05/08/2015  . Complication of anesthesia    DIFFICULT WAKING   . COPD (chronic obstructive pulmonary disease) Gold C Frequent exacerbations 01/13/2014   [name] 07/08/14: FeV1 51% FeV1/FVC 66% FVC 59% 10/5/2015ONO RA was normal  10/13/2014  ONO on RA NORMAL   . COPD, frequent exacerbations ([place])    pulmologist-  dr Joya Gaskins--  G[place]a Rm Stage C.04-25-15 recent COPD exacerbation-much improved now, after tx. in ER [facility] [place].  Marland Kitchen Depression   . Diabetes mellitus without complication ([place])    BODERLINE - DIET CONTROL  . Dysrhythmia    PVC'S  . Emphysema lung ([place])    stage 2  . Family history of adverse reaction to anesthesia    father would wake up with agitation   . Former smoker 01/13/2014  . Gastroesophageal reflux disease without esophagitis   . Heavy alcohol use 04/30/2016  . History of chronic bronchitis   . History of oxygen administration    oxygen use 2 l/m nasally at bedtime and exertional occasions  . History of rheumatic fever   . History of TB (tuberculosis)    1984--  hospitalized for 4 month treatment  . History of urinary retention   . Hx of multiple concussions    x 2 per patient   . Hypertension   . Melanoma ([place])   . Nocturnal oxygen desaturation    USES O2 NIGHTLY  . OSA (obstructive sleep apnea) 01/13/2014  . PONV (postoperative nausea and vomiting)   . Post-polio syndrome    polio at age 30--PT  WAS IN IRON LUNG; PT WAS IN W/C UNTIL AGE 44; STILL HAS WEAKNESS RIGHT SIDE  . Prostate cancer ([place])   . Schizophrenia ([place])   . Tuberculosis    Hosp 4 months rx , left early   . Urticaria     SOCIAL HX:  Social History   Tobacco Use  . Smoking status: Former Smoker    Years: 47.00    Types: Pipe    Quit date: 08/25/2013    Years since quitting: 5.6  . Smokeless tobacco: Former Systems developer    Types: Chew  . Tobacco comment: 5-6 times smoking a pipe  Substance Use Topics  . Alcohol use: Yes    Alcohol/week: 14.0 standard drinks    Types: 14 Cans of beer per week     Comment: 2 cans daily    PHYSICAL EXAM:   General: NAD noted during phonecall Cardiovascular:unable to assess Pulmonary: speech is not forced Neurological: A&O x 3 Psych:  High energy with conversation.  Cooperative  Gonzella Lex, NP-C

## 2019-05-11 ENCOUNTER — Ambulatory Visit: Payer: Medicare HMO | Admitting: Family Medicine

## 2019-05-18 ENCOUNTER — Telehealth: Payer: Self-pay

## 2019-05-18 NOTE — Telephone Encounter (Signed)
Received message that patient had called and requested a return call. Call returned to patient. VM left.

## 2019-05-18 NOTE — Telephone Encounter (Signed)
Phone call placed to [facility] to call in 1 refill of ipratropium albuterol 0.5/2.5.

## 2019-05-21 ENCOUNTER — Other Ambulatory Visit: Payer: Self-pay

## 2019-05-21 ENCOUNTER — Ambulatory Visit: Payer: Medicare HMO | Admitting: Allergy & Immunology

## 2019-05-21 ENCOUNTER — Encounter: Payer: Self-pay | Admitting: Allergy & Immunology

## 2019-05-21 VITALS — BP 160/72 | HR 104 | Temp 98.6°F | Resp 20 | Ht 72.0 in | Wt 235.0 lb

## 2019-05-21 DIAGNOSIS — K219 Gastro-esophageal reflux disease without esophagitis: Secondary | ICD-10-CM

## 2019-05-21 DIAGNOSIS — G4733 Obstructive sleep apnea (adult) (pediatric): Secondary | ICD-10-CM

## 2019-05-21 DIAGNOSIS — J3089 Other allergic rhinitis: Secondary | ICD-10-CM

## 2019-05-21 DIAGNOSIS — T63441D Toxic effect of venom of bees, accidental (unintentional), subsequent encounter: Secondary | ICD-10-CM

## 2019-05-21 DIAGNOSIS — J449 Chronic obstructive pulmonary disease, unspecified: Secondary | ICD-10-CM

## 2019-05-21 DIAGNOSIS — J302 Other seasonal allergic rhinitis: Secondary | ICD-10-CM

## 2019-05-21 NOTE — Progress Notes (Signed)
FOLLOW UP  Date of Service/Encounter:  05/21/19   Assessment:   Severe persistent asthmawith COPD overlap  Chronic prednisone use (5+ years) - currently on 10mg  daily(with intermittent self-induced bursts up to 20mg  daily)  Seasonal and perennial allergic rhinitis(grasses, weeds, trees, cat, dog, dust mite, and cockroach)  History of non-compliance  Pulmonary nodules (stable per last chest CT June 2019)  Complex medical history, including bipolar depression  GERD - not on a controller medication at this time  OSA - with history of non-compliance with CPAP    [name] presents for a follow-up visit.  He has been MIA for several months now, which seems to be his norm.  His asthma COPD overlap is not under great control.  He seems to have run out of all of his controller medications and needs refills.  At the last visit, we changed him from all nebulized medications to a combination of nebulized and metered-dose inhaler medications to help decrease his cost.  He would like to go back to all nebulized medications now.  He apparently changes supplemental Medicare plan to provide better coverage of his pharmaceuticals.  He remains on chronic prednisone.  He still would benefit from starting mepolizumab and he seems willing to do it.  However, he seemed willing to do it at the last visit and we see where that ended up.  Regardless, he is open to trying.  We will change his medication regimen back to all nebulized forms.  We are going to fill these through a mail order special pharmacy that works well with Medicare patients.  I will have 1 of my medical assistance personally ensure that these prescriptions are sent correctly to the mail order pharmacy.  I will also ask our Biologics Coordinator to reach out to discuss starting mepolizumab.  Plan/Recommendations:   1. Severe persistent asthma with COPD overlap - Lung function was surprisingly stable today. - We will get you  back on the nebulized medications since these worked best for you. - We will arrange for these medications to be sent to you through a specialized pharmacy (they will send you the medications in the mail). - I am going to have [name] take care of ordering these through the this specialized pharmacy (she will call you tomorrow).  - I am going to talk to Tammy about the Nucala approval. - Be sure to answer your phone when it rings.  - Daily controller medication(s): Pulmicort 0.5mg  (inhaled steroid) TWO TIMES DAILY + Brovana 59mcg TWO TIMES DAILY + Yupelri ONE TIME DAILY + montelukast 10 mg ONE TIME DAILY - Prior to physical activity: ProAir 2 puffs 10-15 minutes before physical activity. - Rescue medications: albuterol nebulizer one vial every 4-6 hours as needed or DuoNeb nebulizer one vial every 4-6 hours as needed - Asthma control goals:  * Full participation in all desired activities (may need albuterol before activity) * Albuterol use two time or less a week on average (not counting use with activity) * Cough interfering with sleep two time or less a month * Oral steroids no more than once a year * No hospitalizations  2. Perennial and seasonal allergic rhinitis - Continue with loratadine 10mg  daily as needed. - Continue with fluticasone 1-2 sprays per nostril daily as needed.   3. GERD - Continue omeprazole 40mg  daily to control your reflux.  - We will refer you to see Gastroenterology again given your history of esophageal strictures and ballooning required in the past.   4.  Return in about 6 weeks (around 07/02/2019). This can be an in-person, a virtual Webex or a telephone follow up visit.  Subjective:   [name] is a 68 y.o. male presenting today for follow up of  Chief Complaint  Patient presents with  . Asthma  . COPD    [name] has a history of the following: Patient Active Problem List   Diagnosis Date Noted  . COPD with acute exacerbation ([place])  12/04/2018  . PSVT (paroxysmal supraventricular tachycardia) ([place]) 09/23/2018  . Seasonal and perennial allergic rhinitis 07/30/2018  . COPD with asthma ([place]) 07/07/2018  . S/P arthroscopy of right shoulder 04/25/2017  . Generalized anxiety disorder 04/22/2017  . Severe persistent asthma without complication 23/55/7322  . Bee sting-induced anaphylaxis 02/12/2017  . Anaphylactic reaction due to food, initial encounter 02/12/2017  . Current use of beta blocker 02/12/2017  . Allergic rhinitis 01/15/2017  . COPD exacerbation ([place]) 11/04/2016  . Asthma exacerbation 11/03/2016  . Nausea without vomiting 09/17/2016  . Shortness of breath   . Diabetes mellitus type 2 in obese ([place]) 06/26/2016  . non-specific Chest pain 06/12/2016  . Patient's noncompliance with other medical treatment and regimen 06/02/2016  . Hyperlipidemia, mixed 06/02/2016  . Elevated liver enzymes  06/02/2016  . Schizophrenia ([place]) 04/30/2016  . Environmental and seasonal allergies 04/30/2016  . Diabetes mellitus without complication- (diet controlled; w/o proteinuria) 04/30/2016  . Heavy alcohol use- Many yrs  04/30/2016  . Basal cell carcinoma of back 04/30/2016  . Obesity 04/30/2016  . Hx of multiple concussions 04/30/2016  . h/o Vitamin D deficiency 04/30/2016  . Screen for sexually transmitted diseases 04/30/2016  . Immunocompromised state ([place]) 04/30/2016  . Cervical radiculopathy 01/15/2016  . Alcohol withdrawal ([place]) 11/22/2015  . Alcohol abuse with alcohol-induced mood disorder ([place]) 11/22/2015  . h/o Suicidal thoughts 11/21/2015  . Dry mouth 10/07/2015  . Anxiety 09/19/2015  . OAB (overactive bladder) 07/27/2015  . Chronic idiopathic constipation 05/08/2015  . Screening for colon cancer   . Benign neoplasm of cecum   . Benign neoplasm of ascending colon   . Benign neoplasm of transverse colon   . Benign neoplasm of sigmoid colon   . Rectal polyp   . Gastroesophageal reflux disease without esophagitis    . Esophageal stricture   . Joint stiffness of multiple sites 10/17/2014  . Benign localized hyperplasia of prostate with urinary obstruction 09/29/2014  . concerns for memory loss 09/13/2014  . Tuberculosis   . Diverticulum of bladder 06/17/2014  . Acute urinary retention 05/31/2014  . Neuropathy ([place]) 04/11/2014  . Post-polio syndrome 01/13/2014  . Other emphysema ([place]) 01/13/2014  . Hypertension 01/13/2014  . Former smoker 01/13/2014  . h/o Prostate cancer 01/13/2014  . History of tuberculosis 01/13/2014  . Bipolar disorder ([place]) 01/13/2014  . Explosive personality disorder ([place]) 01/13/2014  . Erectile dysfunction 01/13/2014  . Nephrolithiasis 01/13/2014  . Chronic pain 01/13/2014  . Obstructive sleep apnea 01/13/2014  . Alcoholic gastritis 02/54/2706  . Nocturnal hypoxia 08/10/2013    History obtained from: chart review and patient.  [name] is a 69 y.o. male presenting for a follow up visit.  He was last seen in January 2020.  At that time, his lung function was stable.  He was reporting that his regimen was very expensive and wanted to discuss other options.  We changed him from [place] and budesonide to Symbicort 160/4.5 mcg twice daily with a spacer in conjunction with prednisone 5 mg twice daily and DuoNeb  1 treatment twice daily.  We also recommended albuterol nebulizers or DuoNeb nebulizers every 4-6 hours as needed.  For his allergic rhinitis, we continued with Claritin and fluticasone.  We continued with omeprazole for his GERD.  He has a history of obstructive sleep apnea we recommended that he talk to his new pulmonologist about being fitted for a CPAP machine.  In the interim, he was admitted to the hospital for an asthma exacerbation.  This was in February 2020.  He was started on a prednisone burst and azithromycin.  His rapid flu was negative and he had no focal infiltrates on the chest x-ray.  It still does not seem that he has seen pulmonology as requested on a  number of occasions.  It also seems that he has not started mepolizumab. It is unclear why this happened.   It seems that he was referred to palliative care, more for th nurse practitioner home visits rather than an actual six-month prognosis.  This was done to help with medications and monitor his COPD symptoms. He seems confused why he was referred to palliative care.   He is in quite a mood today, as he got it taken on his way to the office.  Apparently he has been driving around with expired tags.  He was not speeding, thankfully.  He tells me that the to get was still less than getting the tags replaced, so he is still in the black.    Regarding his noncompliance and lack of follow-up for this, he tells me he has been on a "medical sabbatical" and has been avoiding the entire medical system for 6 months.  He tells me I am the first doctor that he has seen in person as opposed to a virtual visit since ending his self-imposed sabbatical.  [name] has not been using his Symbicort two puffs twice daily. He saw his PCP Molli Barrows FNP) at the end of March 2020. She refilled all of his medications, although today he tells me that he has not seen her in "forever". He also says that his palliative care NP also recently refilled his medications. But despite this he tells me that he has only been using his DuoNeb treatments.  He did go to see a Pulmonologist since the last visit. This was Dr. Alcide Clever in [place] and he was told that he would not be seen unless he uses his CPAP; he refused and therefore is not seeing Dr. Alcide Clever any longer. This makes four pulmonologists that he has fired.  During his first visit with him, he was started on Trelegy but apparently developed shortness of breath with this.  He stopped the Trelegy and went back to DuoNebs as needed.    However, he tells me now that he has changed to a different supplemental insurance plan that has better pharmacy coverage.  He would like to  change back to all of his nebulized medications since he felt better on these.  He has been trying to stay safe during the coronavirus pandemic. 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  Otherwise, there have been no changes to his past medical history, surgical history, family history, or social history.    Review of Systems  Constitutional: Negative.  Negative for chills, fever, malaise/fatigue and weight loss.  HENT: Negative.  Negative for congestion, ear discharge, ear pain and sinus pain.   Eyes: Negative for pain, discharge and redness.  Respiratory: Positive  for cough, sputum production and shortness of breath. Negative for wheezing.   Cardiovascular: Negative.  Negative for chest pain and palpitations.  Gastrointestinal: Negative for abdominal pain, heartburn, nausea and vomiting.  Skin: Negative.  Negative for itching and rash.  Neurological: Negative for dizziness and headaches.  Endo/Heme/Allergies: Negative for environmental allergies. Does not bruise/bleed easily.       Objective:   Blood pressure (!) 160/72, pulse (!) 104, temperature 98.6 F (37 C), temperature source Temporal, resp. rate 20, height 6' (1.829 m), weight 235 lb (106.6 kg), SpO2 96 %. Body mass index is 31.87 kg/m.   Physical Exam:  Physical Exam  Constitutional: He appears well-developed.  Boisterous male. Very frank.   HENT:  Head: Normocephalic and atraumatic.  Right Ear: Tympanic membrane, external ear and ear canal normal.  Left Ear: Tympanic membrane, external ear and ear canal normal.  Nose: Mucosal edema and rhinorrhea present. No nasal deformity or septal deviation. No epistaxis. Right sinus exhibits no maxillary sinus tenderness and no frontal sinus tenderness. Left sinus exhibits no maxillary sinus tenderness and no frontal sinus tenderness.  Mouth/Throat: Uvula is midline and oropharynx is clear and moist. Mucous membranes are not  pale and not dry.  Cobblestoning present in the posterior oropharynx.  Eyes: Pupils are equal, round, and reactive to light. Conjunctivae and EOM are normal. Right eye exhibits no chemosis and no discharge. Left eye exhibits no chemosis and no discharge. Right conjunctiva is not injected. Left conjunctiva is not injected.  Allergic shiners present bilaterally.   Cardiovascular: Normal rate, regular rhythm and normal heart sounds.  Respiratory: Effort normal and breath sounds normal. No accessory muscle usage. No tachypnea. No respiratory distress. He has no wheezes. He has no rhonchi. He has no rales. He exhibits no tenderness.  Decreased air movement throughout. Expiratory wheezes noted in the upper air fields.   Lymphadenopathy:    He has no cervical adenopathy.  Neurological: He is alert.  Skin: No abrasion, no petechiae and no rash noted. Rash is not papular, not vesicular and not urticarial. No erythema. No pallor.  No eczematous or urticarial lesions noted.   Psychiatric: He has a normal mood and affect.     Diagnostic studies:    Spirometry: results abnormal (FEV1: 1.55/44%, FVC: 2.28/47%, FEV1/FVC: 68%).    Spirometry consistent with mixed obstructive and restrictive disease.   Allergy Studies: none      [name], MD  [facility]

## 2019-05-21 NOTE — Patient Instructions (Addendum)
1. Severe persistent asthma with COPD overlap - Lung function was surprisingly stable today. - We will get you back on the nebulized medications since these worked best for you. - We will arrange for these medications to be sent to you through a specialized pharmacy (they will send you the medications in the mail). - I am going to have [name] take care of ordering these through the this specialized pharmacy (she will call you tomorrow).  - I am going to talk to Tammy about the Nucala approval. - Be sure to answer your phone when it rings.  - Daily controller medication(s): Pulmicort 0.5mg  (inhaled steroid) TWO TIMES DAILY + Brovana 43mcg TWO TIMES DAILY + Yupelri ONE TIME DAILY + montelukast 10 mg ONE TIME DAILY - Prior to physical activity: ProAir 2 puffs 10-15 minutes before physical activity. - Rescue medications: albuterol nebulizer one vial every 4-6 hours as needed or DuoNeb nebulizer one vial every 4-6 hours as needed - Asthma control goals:  * Full participation in all desired activities (may need albuterol before activity) * Albuterol use two time or less a week on average (not counting use with activity) * Cough interfering with sleep two time or less a month * Oral steroids no more than once a year * No hospitalizations  2. Perennial and seasonal allergic rhinitis - Continue with loratadine 10mg  daily as needed. - Continue with fluticasone 1-2 sprays per nostril daily as needed.   3. GERD - Continue omeprazole 40mg  daily to control your reflux.   4. Return in about 6 weeks (around 07/02/2019). This can be an in-person, a virtual Webex or a telephone follow up visit.   Please inform us of any Emergency Department visits, hospitalizations, or changes in symptoms. Call us before going to the ED for breathing or allergy symptoms since we might be able to fit you in for a sick visit. Feel free to contact us anytime with any questions, problems, or concerns.  It was a pleasure to see  you again today!  Websites that have reliable patient information: 1. American Academy of Asthma, Allergy, and Immunology: www.aaaai.org 2. Food Allergy Research and Education (FARE): foodallergy.org 3. Mothers of Asthmatics: http://www.asthmacommunitynetwork.org 4. American College of Allergy, Asthma, and Immunology: www.acaai.org  "Like" [name] on Facebook and Instagram for our latest updates!      Make sure you are registered to vote! If you have moved or changed any of your contact information, you will need to get this updated before voting!  In some cases, you MAY be able to register to vote online: [email or web address]    Voter ID laws are NOT going into effect for the General Election in November 2020! DO NOT let this stop you from exercising your right to vote!   Absentee voting is the SAFEST way to vote during the coronavirus pandemic!   Download and print an absentee ballot request form at [email or web address]-[email or web address]-Request or you can scan the QR code below with your smart phone:      More information on absentee ballots can be found here: [email or web address]

## 2019-05-22 ENCOUNTER — Encounter: Payer: Self-pay | Admitting: Allergy & Immunology

## 2019-05-22 ENCOUNTER — Other Ambulatory Visit: Payer: Self-pay

## 2019-05-22 MED ORDER — OMEPRAZOLE 40 MG PO CPDR: 40.0000 mg | DELAYED_RELEASE_CAPSULE | Freq: Every day | ORAL | 5 refills | Status: DC

## 2019-05-22 MED ORDER — LORATADINE 10 MG PO TABS: 10.0000 mg | ORAL_TABLET | Freq: Every day | ORAL | 5 refills | Status: DC | PRN

## 2019-05-22 MED ORDER — YUPELRI 175 MCG/3ML IN SOLN: 1.0000 | Freq: Every day | RESPIRATORY_TRACT | 1 refills | Status: DC

## 2019-05-22 MED ORDER — BROVANA 15 MCG/2ML IN NEBU: 15.0000 ug | INHALATION_SOLUTION | Freq: Two times a day (BID) | RESPIRATORY_TRACT | 1 refills | Status: DC

## 2019-05-22 MED ORDER — MONTELUKAST SODIUM 10 MG PO TABS: 10.0000 mg | ORAL_TABLET | Freq: Every day | ORAL | 5 refills | Status: DC

## 2019-05-22 MED ORDER — BUDESONIDE 0.5 MG/2ML IN SUSP: 0.5000 mg | Freq: Two times a day (BID) | RESPIRATORY_TRACT | 1 refills | Status: DC

## 2019-05-22 NOTE — Progress Notes (Signed)
I left a detailed message advising patient that the prescriptions had been sent to the pharmacy in [place]. I let him know on the message that they would be calling him for his insurance and shipping information. I let him know to give [name] a call back with any further questions.

## 2019-05-22 NOTE — Telephone Encounter (Signed)
Prescriptions have been sent in as requested by patient.

## 2019-05-22 NOTE — Addendum Note (Signed)
Addended by: Lucrezia Starch I on: 05/22/2019 12:14 PM   Modules accepted: Orders

## 2019-05-22 NOTE — Addendum Note (Signed)
Addended by: Lucrezia Starch I on: 05/22/2019 02:30 PM   Modules accepted: Orders

## 2019-05-25 ENCOUNTER — Telehealth: Payer: Self-pay | Admitting: *Deleted

## 2019-05-25 NOTE — Telephone Encounter (Signed)
-----   Message from Valentina Shaggy, MD sent at 05/22/2019  9:18 AM EDT ----- He wants to start Nucala. Let's do it in [facility] since this is closer to his home. I think he has everything regarding his income verification, although I could be totally wrong.

## 2019-05-25 NOTE — Telephone Encounter (Signed)
Called patient and advised still need 2020 social security benefit info in order to submit to [facility] for free medication.  He advised he would try to locate and I advised him he could take same to [place] or [facility] and have them fax to me.

## 2019-05-27 ENCOUNTER — Telehealth: Payer: Self-pay

## 2019-05-27 NOTE — Telephone Encounter (Signed)
Noted. Thanks, Dee.  [name], MD [facility] [place]

## 2019-05-27 NOTE — Telephone Encounter (Signed)
Referral has been placed to [facility] in [place].  Thanks

## 2019-05-27 NOTE — Telephone Encounter (Signed)
-----   Message from Valentina Shaggy, MD sent at 05/22/2019  9:20 AM EDT ----- GI referral placed. He lives in [place]. Thanks!

## 2019-05-28 ENCOUNTER — Emergency Department (HOSPITAL_COMMUNITY): Payer: Medicare HMO

## 2019-05-28 ENCOUNTER — Emergency Department (HOSPITAL_COMMUNITY)
Admission: EM | Admit: 2019-05-28 | Discharge: 2019-05-29 | Disposition: A | Discharge status: Home / Self Care | Payer: Medicare HMO | Attending: Emergency Medicine | Admitting: Emergency Medicine

## 2019-05-28 ENCOUNTER — Encounter (HOSPITAL_COMMUNITY): Payer: Self-pay

## 2019-05-28 ENCOUNTER — Other Ambulatory Visit: Payer: Self-pay

## 2019-05-28 DIAGNOSIS — T17308A Unspecified foreign body in larynx causing other injury, initial encounter: Secondary | ICD-10-CM

## 2019-05-28 DIAGNOSIS — E119 Type 2 diabetes mellitus without complications: Secondary | ICD-10-CM | POA: Insufficient documentation

## 2019-05-28 DIAGNOSIS — Y92511 Restaurant or cafe as the place of occurrence of the external cause: Secondary | ICD-10-CM | POA: Insufficient documentation

## 2019-05-28 DIAGNOSIS — T17320A Food in larynx causing asphyxiation, initial encounter: Secondary | ICD-10-CM | POA: Diagnosis not present

## 2019-05-28 DIAGNOSIS — R062 Wheezing: Secondary | ICD-10-CM | POA: Diagnosis not present

## 2019-05-28 DIAGNOSIS — Y9389 Activity, other specified: Secondary | ICD-10-CM | POA: Insufficient documentation

## 2019-05-28 DIAGNOSIS — Y999 Unspecified external cause status: Secondary | ICD-10-CM | POA: Insufficient documentation

## 2019-05-28 DIAGNOSIS — Z87891 Personal history of nicotine dependence: Secondary | ICD-10-CM | POA: Insufficient documentation

## 2019-05-28 DIAGNOSIS — Z79899 Other long term (current) drug therapy: Secondary | ICD-10-CM | POA: Insufficient documentation

## 2019-05-28 DIAGNOSIS — I10 Essential (primary) hypertension: Secondary | ICD-10-CM | POA: Diagnosis not present

## 2019-05-28 DIAGNOSIS — J449 Chronic obstructive pulmonary disease, unspecified: Secondary | ICD-10-CM | POA: Insufficient documentation

## 2019-05-28 DIAGNOSIS — Y33XXXA Other specified events, undetermined intent, initial encounter: Secondary | ICD-10-CM | POA: Diagnosis not present

## 2019-05-28 DIAGNOSIS — Z8611 Personal history of tuberculosis: Secondary | ICD-10-CM | POA: Diagnosis not present

## 2019-05-28 DIAGNOSIS — J45909 Unspecified asthma, uncomplicated: Secondary | ICD-10-CM | POA: Insufficient documentation

## 2019-05-28 DIAGNOSIS — Z880 Allergy status to penicillin: Secondary | ICD-10-CM | POA: Diagnosis not present

## 2019-05-28 LAB — COMPREHENSIVE METABOLIC PANEL
ALT: 38 U/L (ref 0–44)
AST: 27 U/L (ref 15–41)
Albumin: 3.9 g/dL (ref 3.5–5.0)
Alkaline Phosphatase: 44 U/L (ref 38–126)
Anion gap: 7 (ref 5–15)
BUN: 13 mg/dL (ref 8–23)
CO2: 24 mmol/L (ref 22–32)
Calcium: 9 mg/dL (ref 8.9–10.3)
Chloride: 102 mmol/L (ref 98–111)
Creatinine, Ser: 0.95 mg/dL (ref 0.61–1.24)
GFR calc Af Amer: >60 mL/min (ref >60)
GFR calc non Af Amer: >60 mL/min (ref >60)
Glucose, Bld: 113 mg/dL — ABNORMAL HIGH (ref 70–99)
Potassium: 4.3 mmol/L (ref 3.5–5.1)
Sodium: 133 mmol/L — ABNORMAL LOW (ref 135–145)
Total Bilirubin: 0.4 mg/dL (ref 0.3–1.2)
Total Protein: 7.1 g/dL (ref 6.5–8.1)

## 2019-05-28 LAB — CBC WITH DIFFERENTIAL/PLATELET
Abs Immature Granulocytes: 0.09 10*3/uL — ABNORMAL HIGH (ref 0.00–0.07)
Basophils Absolute: 0 10*3/uL (ref 0.0–0.1)
Basophils Relative: 0 %
Eosinophils Absolute: 0.2 10*3/uL (ref 0.0–0.5)
Eosinophils Relative: 3 %
HCT: 42.7 % (ref 39.0–52.0)
Hemoglobin: 14.4 g/dL (ref 13.0–17.0)
Immature Granulocytes: 1 %
Lymphocytes Relative: 16 %
Lymphs Abs: 1.2 10*3/uL (ref 0.7–4.0)
MCH: 33.2 pg (ref 26.0–34.0)
MCHC: 33.7 g/dL (ref 30.0–36.0)
MCV: 98.4 fL (ref 80.0–100.0)
Monocytes Absolute: 0.5 10*3/uL (ref 0.1–1.0)
Monocytes Relative: 7 %
Neutro Abs: 5.3 10*3/uL (ref 1.7–7.7)
Neutrophils Relative %: 73 %
Platelets: 233 10*3/uL (ref 150–400)
RBC: 4.34 MIL/uL (ref 4.22–5.81)
RDW: 12.7 % (ref 11.5–15.5)
WBC: 7.3 10*3/uL (ref 4.0–10.5)
nRBC: 0 % (ref 0.0–0.2)

## 2019-05-28 MED ORDER — IPRATROPIUM BROMIDE HFA 17 MCG/ACT IN AERS
2.0000 | INHALATION_SPRAY | Freq: Once | RESPIRATORY_TRACT | Status: AC
  Administered 2019-05-28: 2 via RESPIRATORY_TRACT
  Filled 2019-05-28: qty 12.9

## 2019-05-28 MED ORDER — ALBUTEROL SULFATE HFA 108 (90 BASE) MCG/ACT IN AERS
2.0000 | INHALATION_SPRAY | Freq: Once | RESPIRATORY_TRACT | Status: AC
  Administered 2019-05-28: 2 via RESPIRATORY_TRACT
  Filled 2019-05-28: qty 6.7

## 2019-05-28 MED ORDER — METHYLPREDNISOLONE SODIUM SUCC 125 MG IJ SOLR
125.0000 mg | Freq: Once | INTRAMUSCULAR | Status: AC
  Administered 2019-05-28: 125 mg via INTRAVENOUS
  Filled 2019-05-28: qty 2

## 2019-05-28 NOTE — ED Provider Notes (Signed)
[facility] DEPT Provider Note   CSN: [id number] Arrival date & time: 05/28/19  1946     History   Chief Complaint Chief Complaint  Patient presents with   Partial Airway Obstruction    HPI [name] is a 68 y.o. male.     The history is provided by the patient and medical records. No language interpreter was used.  Shortness of Breath Severity:  Severe Onset quality:  Sudden Duration:  45 minutes Timing:  Sporadic Progression:  Improving Chronicity:  Recurrent Relieved by:  Nothing Worsened by:  Nothing Ineffective treatments:  None tried Associated symptoms: sore throat and wheezing   Associated symptoms: no abdominal pain, no chest pain, no cough, no diaphoresis, no fever, no headaches, no neck pain, no rash, no sputum production and no vomiting (after choking yesterday)     Past Medical History:  Diagnosis Date   Anxiety disorder    Arthritis    BILATERAL SHOULDERS, ELBOWS AND HANDS AND LEFT HIP AND KNEES--HX CORTISONE SHOTS IN SHOULDERS, ELBOWS, HIP AND KNEES   Basal cell carcinoma of back 04/30/2016   Dermatologist- Dr Nevada Crane;   MOHS sx- Dr Levada Dy    Bladder outlet obstruction    Chronic idiopathic constipation 4/81/8563   Complication of anesthesia    DIFFICULT WAKING    COPD (chronic obstructive pulmonary disease) Gold C Frequent exacerbations 01/13/2014   [name] 07/08/14: FeV1 51% FeV1/FVC 66% FVC 59% 10/5/2015ONO RA was normal  10/13/2014  ONO on RA NORMAL    COPD, frequent exacerbations ([place])    pulmologist-  dr Joya Gaskins--  [place] Rm Stage C.04-25-15 recent COPD exacerbation-much improved now, after tx. in ER [facility].   Depression    Diabetes mellitus without complication ([place])    BODERLINE - DIET CONTROL   Dysrhythmia    PVC'S   Emphysema lung (HCC)    stage 2   Family history of adverse reaction to anesthesia    father would wake up with agitation    Former smoker 01/13/2014   Gastroesophageal reflux  disease without esophagitis    Heavy alcohol use 04/30/2016   History of chronic bronchitis    History of oxygen administration    oxygen use 2 l/m nasally at bedtime and exertional occasions   History of rheumatic fever    History of TB (tuberculosis)    1984--  hospitalized for 4 month treatment   History of urinary retention    Hx of multiple concussions    x 2 per patient    Hypertension    Melanoma ([place])    Nocturnal oxygen desaturation    USES O2 NIGHTLY   OSA (obstructive sleep apnea) 01/13/2014   PONV (postoperative nausea and vomiting)    Post-polio syndrome    polio at age 10--PT WAS IN IRON LUNG; PT WAS IN W/C UNTIL AGE 34; STILL HAS WEAKNESS RIGHT SIDE   Prostate cancer ([place])    Schizophrenia ([place])    Tuberculosis    Hosp 4 months rx , left early    Urticaria     Patient Active Problem List   Diagnosis Date Noted   COPD with acute exacerbation ([place]) 12/04/2018   PSVT (paroxysmal supraventricular tachycardia) ([place]) 09/23/2018   Seasonal and perennial allergic rhinitis 07/30/2018   COPD with asthma ([place]) 07/07/2018   S/P arthroscopy of right shoulder 04/25/2017   Generalized anxiety disorder 04/22/2017   Severe persistent asthma without complication 14/97/0263   Bee sting-induced anaphylaxis 02/12/2017   Anaphylactic reaction due to  food, initial encounter 02/12/2017   Current use of beta blocker 02/12/2017   Allergic rhinitis 01/15/2017   COPD exacerbation ([place]) 11/04/2016   Asthma exacerbation 11/03/2016   Nausea without vomiting 09/17/2016   Shortness of breath    Diabetes mellitus type 2 in obese ([place]) 06/26/2016   non-specific Chest pain 06/12/2016   Patient's noncompliance with other medical treatment and regimen 06/02/2016   Hyperlipidemia, mixed 06/02/2016   Elevated liver enzymes  06/02/2016   Schizophrenia ([place]) 04/30/2016   Environmental and seasonal allergies 04/30/2016   Diabetes mellitus without  complication- (diet controlled; w/o proteinuria) 04/30/2016   Heavy alcohol use- Many yrs  04/30/2016   Basal cell carcinoma of back 04/30/2016   Obesity 04/30/2016   Hx of multiple concussions 04/30/2016   h/o Vitamin D deficiency 04/30/2016   Screen for sexually transmitted diseases 04/30/2016   Immunocompromised state ([place]) 04/30/2016   Cervical radiculopathy 01/15/2016   Alcohol withdrawal ([place]) 11/22/2015   Alcohol abuse with alcohol-induced mood disorder ([place]) 11/22/2015   h/o Suicidal thoughts 11/21/2015   Dry mouth 10/07/2015   Anxiety 09/19/2015   OAB (overactive bladder) 07/27/2015   Chronic idiopathic constipation 05/08/2015   Screening for colon cancer    Benign neoplasm of cecum    Benign neoplasm of ascending colon    Benign neoplasm of transverse colon    Benign neoplasm of sigmoid colon    Rectal polyp    Gastroesophageal reflux disease without esophagitis    Esophageal stricture    Joint stiffness of multiple sites 10/17/2014   Benign localized hyperplasia of prostate with urinary obstruction 09/29/2014   concerns for memory loss 09/13/2014   Tuberculosis    Diverticulum of bladder 06/17/2014   Acute urinary retention 05/31/2014   Neuropathy ([place]) 04/11/2014   Post-polio syndrome 01/13/2014   Other emphysema ([place]) 01/13/2014   Hypertension 01/13/2014   Former smoker 01/13/2014   h/o Prostate cancer 01/13/2014   History of tuberculosis 01/13/2014   Bipolar disorder ([place]) 01/13/2014   Explosive personality disorder ([place]) 01/13/2014   Erectile dysfunction 01/13/2014   Nephrolithiasis 01/13/2014   Chronic pain 01/13/2014   Obstructive sleep apnea 13/05/6577   Alcoholic gastritis 46/96/2952   Nocturnal hypoxia 08/10/2013    Past Surgical History:  Procedure Laterality Date   CARDIOVASCULAR STRESS TEST  06-08-2014  dr Mare Ferrari   normal lexiscan study/  no ischemia/  not gated due to PAC's   COLONOSCOPY N/A  05/03/2015   Procedure: COLONOSCOPY;  Surgeon: [name], MD;  Location: [facility];  Service: Endoscopy;  Laterality: N/A;   CYSTOSCOPY N/A 10/25/2015   Procedure: CYSTOSCOPY;  Surgeon: [name], MD;  Location: [facility]S;  Service: Urology;  Laterality: N/A;   CYSTOSCOPY W/ CYSTOGRAM/  TRANSRECTAL ULTRASOUND PROSTATE BX  03-22-2009   ESOPHAGOGASTRODUODENOSCOPY N/A 03/22/2015   Procedure: ESOPHAGOGASTRODUODENOSCOPY (EGD) with dilation;  Surgeon: [name], MD;  Location: [facility];  Service: Endoscopy;  Laterality: N/A;   excision of skin lesion     LAPAROSCOPIC CHOLECYSTECTOMY  2013   left elbow surgery      due to fracture    NASAL SEPTUM SURGERY  2000   OTHER SURGICAL HISTORY      Muscle & bone Graft/Polio   polio surgeries      14 polio surgeries    PROSTATE BIOPSY N/A 09/28/2014   Procedure: PROSTATE ULTRASOUND/BIOPSY;  Surgeon: [name], MD;  Location: [facility];  Service: Urology;  Laterality: N/A;   PROSTATE BIOPSY N/A 10/25/2015   Procedure:  PROSTATE BIOPSY AND ULTRASOUND;  Surgeon: [name], MD;  Location: [facility];  Service: Urology;  Laterality: N/A;   SAVORY DILATION N/A 03/22/2015   Procedure: SAVORY DILATION;  Surgeon: [name], MD;  Location: [facility];  Service: Endoscopy;  Laterality: N/A;   SHOULDER ARTHROSCOPY WITH OPEN ROTATOR CUFF REPAIR Bilateral 2013  &  1999   removal spurs and bursectomy   TRANSURETHRAL INCISION OF BLADDER NECK N/A 10/25/2015   Procedure:  TRANSURETHRAL INCISION OF BLADDER NECK;  Surgeon: [name], MD;  Location: [facility];  Service: Urology;  Laterality: N/A;   TRANSURETHRAL RESECTION OF PROSTATE N/A 09/28/2014   Procedure: TRANSURETHRAL RESECTION OF THE PROSTATE (TURP);  Surgeon: [name], MD;  Location: [facility];  Service: Urology;  Laterality: N/A;   URETEROSOPY STONE EXTRACTION  2000        Home Medications    Prior to Admission medications   Medication Sig Start Date End Date Taking? Authorizing Provider    albuterol (PROAIR HFA) 108 (90 Base) MCG/ACT inhaler Inhale 2 puffs into the lungs every 4 (four) hours as needed for wheezing or shortness of breath. 06/09/18   Valentina Shaggy, MD  albuterol (PROVENTIL) (2.5 MG/3ML) 0.083% nebulizer solution Take 2.5 mg by nebulization every 6 (six) hours as needed for wheezing or shortness of breath.    [provider]  arformoterol (BROVANA) 15 MCG/2ML NEBU Take 2 mLs (15 mcg total) by nebulization 2 (two) times daily. 05/22/19   Valentina Shaggy, MD  baclofen (LIORESAL) 10 MG tablet Take 1 tablet (10 mg total) by mouth daily as needed for muscle spasms. Patient not taking: Reported on 05/21/2019 12/10/18   [name], FNP  bisoprolol (ZEBETA) 5 MG tablet Take 1 tablet (5 mg total) by mouth daily. 12/10/18   [name], FNP  budesonide (PULMICORT) 0.5 MG/2ML nebulizer solution Take 2 mLs (0.5 mg total) by nebulization 2 (two) times daily. 05/22/19   Valentina Shaggy, MD  budesonide-formoterol [facility]) 160-4.5 MCG/ACT inhaler Two puffs with spacer twice a day to prevent cough or wheeze.  Rinse, gargle and spit after use. Patient not taking: Reported on 05/21/2019 12/26/18   Valentina Shaggy, MD  esomeprazole (NEXIUM) 40 MG capsule Take 40 mg by mouth daily at 12 noon.    [provider]  hydrOXYzine (ATARAX/VISTARIL) 25 MG tablet Take 1 tablet (25 mg total) by mouth 3 (three) times daily as needed. Patient not taking: Reported on 05/21/2019 01/09/19   [name], FNP  ipratropium-albuterol (DUONEB) 0.5-2.5 (3) MG/3ML SOLN USE 1 VIAL IN THE NEBULIZER EVERY 4-6 HOURS AS NEEDED FOR COUGH OR WHEEZE 02/05/19   Valentina Shaggy, MD  loratadine (CLARITIN) 10 MG tablet Take 1 tablet (10 mg total) by mouth daily as needed for allergies. 05/22/19   Valentina Shaggy, MD  montelukast (SINGULAIR) 10 MG tablet Take 1 tablet (10 mg total) by mouth at bedtime. 05/22/19   Valentina Shaggy, MD  nitroGLYCERIN (NITROSTAT)  0.4 MG SL tablet Place 1 tablet (0.4 mg total) under the tongue every 5 (five) minutes as needed for chest pain. Patient not taking: Reported on 05/21/2019 06/21/15   Brunetta Jeans, PA-C  omeprazole (PRILOSEC) 40 MG capsule Take 1 capsule (40 mg total) by mouth daily. 05/22/19   Valentina Shaggy, MD  OXYGEN Inhale 2 L into the lungs at bedtime. DURING THE DAY IF NEEDED Reported on 11/25/2015    [provider]  Revefenacin (YUPELRI) 175 MCG/3ML  SOLN Inhale 1 vial into the lungs daily. 05/22/19   Valentina Shaggy, MD    Family History Family History  Problem Relation Age of Onset   Alzheimer's disease Father 4       Deceased   Stomach cancer Father    Heart attack Father    Heart disease Father    Skin cancer Mother        Facial-Living   Alcohol abuse Sister        x2   Mental illness Sister        x2   Alcohol abuse Sister    Diabetes Maternal Aunt        x2   Thyroid disease Maternal Aunt        x4   Diabetes Maternal Uncle    Tuberculosis Paternal Grandfather    Tuberculosis Paternal Grandmother    Alzheimer's disease Paternal Aunt    Alzheimer's disease Paternal Uncle    Colon cancer Neg Hx    Colon polyps Neg Hx    Crohn's disease Neg Hx    Ulcerative colitis Neg Hx    Allergic rhinitis Neg Hx    Angioedema Neg Hx    Asthma Neg Hx    Eczema Neg Hx    Urticaria Neg Hx     Social History Social History   Tobacco Use   Smoking status: Former Smoker    Years: 47.00    Types: Pipe    Quit date: 08/25/2013    Years since quitting: 5.7   Smokeless tobacco: Former Systems developer    Types: Chew   Tobacco comment: 5-6 times smoking a pipe  Substance Use Topics   Alcohol use: Yes    Alcohol/week: 14.0 standard drinks    Types: 14 Cans of beer per week    Comment: 2 cans daily   Drug use: No     Allergies   Aspirin, Hornet venom, Ivp dye [iodinated diagnostic agents], Levaquin [levofloxacin in d5w], Nsaids, Penicillins,  Tolmetin, Fasenra [benralizumab], Perforomist [formoterol], Buprenorphine hcl, Morphine and related, and Oxycodone   Review of Systems Review of Systems  Constitutional: Negative for appetite change, chills, diaphoresis, fatigue and fever.  HENT: Positive for sore throat and trouble swallowing. Negative for congestion.   Eyes: Negative for visual disturbance.  Respiratory: Positive for choking, shortness of breath and wheezing. Negative for cough, sputum production, chest tightness and stridor.   Cardiovascular: Negative for chest pain, palpitations and leg swelling.  Gastrointestinal: Negative for abdominal pain, constipation, diarrhea, nausea and vomiting (after choking yesterday).  Genitourinary: Negative for flank pain.  Musculoskeletal: Negative for back pain, neck pain and neck stiffness.  Skin: Negative for rash and wound.  Neurological: Negative for headaches.  Psychiatric/Behavioral: Negative for agitation.  All other systems reviewed and are negative.    Physical Exam Updated Vital Signs BP (!) 160/84 (BP Location: Left Arm)    Pulse 81    Temp (!) 97.5 F (36.4 C) (Oral)    Resp 18    Ht 6' (1.829 m)    Wt 106.6 kg    SpO2 98%    BMI 31.87 kg/m   Physical Exam Vitals signs and nursing note reviewed.  Constitutional:      General: He is not in acute distress.    Appearance: He is well-developed. He is not ill-appearing, toxic-appearing or diaphoretic.  HENT:     Head: Normocephalic and atraumatic.     Nose: Nose normal. No congestion or rhinorrhea.  Mouth/Throat:     Mouth: Mucous membranes are moist.     Pharynx: Oropharynx is clear. No oropharyngeal exudate.  Eyes:     Conjunctiva/sclera: Conjunctivae normal.     Pupils: Pupils are equal, round, and reactive to light.  Neck:     Musculoskeletal: Neck supple. No muscular tenderness.  Cardiovascular:     Rate and Rhythm: Normal rate and regular rhythm.     Heart sounds: No murmur.  Pulmonary:     Effort:  Pulmonary effort is normal. No respiratory distress.     Breath sounds: Stridor present. Wheezing present. No rhonchi or rales.  Chest:     Chest wall: No tenderness.  Abdominal:     General: There is no distension.     Palpations: Abdomen is soft.     Tenderness: There is no abdominal tenderness.  Musculoskeletal:        General: No tenderness.     Right lower leg: No edema.     Left lower leg: No edema.  Skin:    General: Skin is warm and dry.     Capillary Refill: Capillary refill takes less than 2 seconds.     Findings: No erythema.  Neurological:     General: No focal deficit present.     Mental Status: He is alert.  Psychiatric:        Mood and Affect: Mood normal.      ED Treatments / Results  Labs (all labs ordered are listed, but only abnormal results are displayed) Labs Reviewed  CBC WITH DIFFERENTIAL/PLATELET - Abnormal; Notable for the following components:      Result Value   Abs Immature Granulocytes 0.09 (*)    All other components within normal limits  COMPREHENSIVE METABOLIC PANEL - Abnormal; Notable for the following components:   Sodium 133 (*)    Glucose, Bld 113 (*)    All other components within normal limits  URINE CULTURE  URINALYSIS, ROUTINE W REFLEX MICROSCOPIC    EKG None  Radiology Ct Soft Tissue Neck Wo Contrast  Result Date: 05/28/2019 CLINICAL DATA:  68 year old male with multiple episodes of choking in the past 2 weeks, choking required Heimlich tonight with persistent wheezing. EXAM: CT NECK WITHOUT CONTRAST TECHNIQUE: Multidetector CT imaging of the neck was performed following the standard protocol without intravenous contrast. COMPARISON:  CT Chest today reported separately. Face CT 10/16/2017 FINDINGS: Pharynx and larynx: Mild motion artifact. But the larynx appears unremarkable. Normal epiglottis. No radiopaque foreign body identified. Pharyngeal soft tissue contours are normal (aside from a retropharyngeal course of both carotids  at the oropharynx on series 1, image 52). Negative retropharyngeal space otherwise. Negative parapharyngeal spaces. Salivary glands: Negative sublingual space. Negative noncontrast submandibular and parotid glands. Thyroid: Negative. Lymph nodes: Negative. Bilateral lymph nodes are diminutive and normal. Vascular: Vascular patency is not evaluated in the absence of IV contrast. Retropharyngeal course of both carotid arteries. Minimal right carotid calcified atherosclerosis in the neck. Limited intracranial: Negative. Visualized orbits: Negative. Mastoids and visualized paranasal sinuses: Clear. Skeleton: Occasional periapical dental inflammatory changes. Cervical spine degeneration, a especially facet arthropathy which is severe on the left with C4-C5 facet ankylosis. No acute osseous abnormality identified. Upper chest: CT Chest today reported separately. But the subglottic airway appears normally patent aside from atelectatic changes in the chest. Other: There is mild chronic stylohyoid ligament calcification bilaterally. This is stable since 2019. IMPRESSION: 1. Negative non-contrast CT appearance of the pharynx, larynx, and upper airway. No radiopaque foreign body identified.  2. Atelectatic changes to the airways in the chest. Chest CT today reported separately. 3. 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 Electronically Signed   By: Genevie Ann M.D.   On: 05/28/2019 21:31   Ct Chest Wo Contrast  Result Date: 05/28/2019 CLINICAL DATA:  68 year old male with choking after dinner. Chest thrust were performed which were successful. Evaluate for possible partial obstruction. Patient reports having breathing difficulty in the last 3 days. History of COPD and asthma. EXAM: CT CHEST WITHOUT CONTRAST TECHNIQUE: Multidetector CT imaging of the chest was performed following the standard protocol without IV contrast. COMPARISON:  Chest radiograph dated 11/16/2018 FINDINGS: Evaluation  of this exam is limited in the absence of intravenous contrast. Cardiovascular: There is no cardiomegaly or pericardial effusion. The thoracic aorta is unremarkable. The central pulmonary arteries are grossly unremarkable as well. Mediastinum/Nodes: There is no hilar or mediastinal adenopathy. The esophagus is grossly unremarkable. No mediastinal fluid collection. Lungs/Pleura: A 3 mm left lung base calcified granuloma. There is a linear nodular density in the left upper lobe extending to the apical pleural surface measuring approximately 15 mm in length. This most likely represents an area of scarring. Follow-up is recommended. There is no focal consolidation, pleural effusion, or pneumothorax. The central airways are patent. Upper Abdomen: Cholecystectomy. Fatty liver. Scattered colonic diverticula. Musculoskeletal: No chest wall mass or suspicious bone lesions identified. IMPRESSION: 1. No acute intrathoracic pathology.  Patent central airways. 2. Linear nodular density in the left upper lobe extending to the apical pleural surface, likely an area of scarring. Follow-up with CT in 3-6 months recommended. 3. Fatty liver. Electronically Signed   By: Anner Crete M.D.   On: 05/28/2019 21:25    Procedures Procedures (including critical care time)  Medications Ordered in ED Medications  albuterol (VENTOLIN HFA) 108 (90 Base) MCG/ACT inhaler 2 puff (2 puffs Inhalation Given 05/28/19 2218)  methylPREDNISolone sodium succinate (SOLU-MEDROL) 125 mg/2 mL injection 125 mg (125 mg Intravenous Given 05/28/19 2218)  ipratropium (ATROVENT HFA) inhaler 2 puff (2 puffs Inhalation Given 05/28/19 2218)     Initial Impression / Assessment and Plan / ED Course  I have reviewed the triage vital signs and the nursing notes.  Pertinent labs & imaging results that were available during my care of the patient were reviewed by me and considered in my medical decision making (see chart for details).        [name] is a 68 y.o. male with a past medical history significant for COPD, asthma onto his oxygen at night, post polio syndrome, prior TB, schizophrenia, diabetes, hypertension, and anxiety who presents with choking.  Patient reports that he was eating an [employer] sandwich this evening, approximately 40 minutes prior to arrival when he choked on his third bite.  Fire department reportedly found him on all fours without passing any air and they provided Heimlich thrusts causing the chicken to dislodge.  They reported a piece flew out but he was continued to have stridor.  Patient feels that he still may have something stuck in his throat.  When asked, patient reports that over the last 2 weeks he has had 2 other choking episodes.  He reports 2 weeks ago he choked and then yesterday he choked and had some vomiting of blood.  He reports that he has had to decrease what he is able to eat over the last 2 weeks to more liquids.  He has never had this problem before but was  told by his pulmonologist that "his throat was closing" and needed to get seen by gastroenterology.  He reports that over the lateral days his breathing has been noisier and difficult.  He reports that his duo nebs have not been helping as much as he does take them twice a day has been taking them up to 7 times a day.  He denies any chest pain or palpitations.  He denies fevers, chills, congestion, or cough.  He denies any other trauma.  He denies abdominal pain but reports he has had decreased urination and decreased bowel movements with constipation.  He reports he had bowel movement 2 days ago with enema use.  On exam, patient does have some stridor.  No significant neck tenderness on my exam.  Lungs were rhonchorous with transmitted upper airway sounds.  Chest and back are nontender.  Abdomen nontender.  Patient moving all extremities.  Oropharyngeal exam unremarkable Monojel evaluation.  Clinically I am concerned not only for the patient's  choking if there is still a piece of chicken present but I am concerned about the cause of his choking multiple times over the last 2 weeks with reported "throat closing" and stridor on exam.  Patient will have CT of the neck and chest without contrast given his anaphylaxis contrast allergy.  Patient had screening labs and urinalysis.  Anticipate following up on the imaging to determine disposition.  9:49 PM CT scans of the neck and chest returned showing a normal appearance of the pharynx larynx and upper airway.  Some atelectasis present.  CT chest showed no acute intrathoracic pathology.  A linear density present in the left upper lobe likely scarring needs follow-up.  Patient still awaiting labs however on my reassessment, his breathing is improved.  He feels that he is nearly back to normal he does not feel that something is still stuck in his throat at this time.  He reports that his breathing has improved. PT given breathing treatment and steroids for breathing.   Patient's spouse arrived and was able to provide further information.  He reports that it was actually fish that the patient choked on from [employer] and not chicken.  Patient also reports that he has had 3 esophageal dilations in the past.  10:46 PM SPoke with GI who reports he is safe for discharge and outpatient follow up if he is able to tolerate fluids. PT was able to drink wanter without choking or difficulty. PT breathing improved with treaments.   If labs are reasonable, PT will be discharged with plans to follow up with GI as an outpatient with strict return precautions.    Final Clinical Impressions(s) / ED Diagnoses   Final diagnoses:  Choking, initial encounter  Wheezing     Clinical Impression: 1. Choking, initial encounter   2. Wheezing       Disposition: Care Transferred to oncoming team to await labs and reassesment.     This note was prepared with assistance of Systems analyst.  Occasional wrong-word or sound-a-like substitutions may have occurred due to the inherent limitations of voice recognition software.        Jerime Arif, Gwenyth Allegra, MD 05/28/19 [phone]

## 2019-05-28 NOTE — ED Provider Notes (Signed)
68 year old male received at signout from Dr. Sherry Ruffing pending labs. Per his HPI:  "[name] is a 68 y.o. male.     The history is provided by the patient and medical records. No language interpreter was used.  Shortness of Breath Severity:  Severe Onset quality:  Sudden Duration:  45 minutes Timing:  Sporadic Progression:  Improving Chronicity:  Recurrent Relieved by:  Nothing Worsened by:  Nothing Ineffective treatments:  None tried Associated symptoms: sore throat and wheezing   Associated symptoms: no abdominal pain, no chest pain, no cough, no diaphoresis, no fever, no headaches, no neck pain, no rash, no sputum production and no vomiting (after choking yesterday)"     3 choking episodes; nothing stuck now, but pain. Seen by [facility]; if labs okay home with liquid diet if labs okay Breathing treatment and steroids for COPD and asthma   Physical Exam  BP 132/69 (BP Location: Left Arm)   Pulse 72   Temp (!) 97.5 F (36.4 C) (Oral)   Resp 18   Ht 6' (1.829 m)   Wt 106.6 kg   SpO2 98%   BMI 31.87 kg/m   Physical Exam Vitals signs and nursing note reviewed.  Constitutional:      Appearance: He is well-developed.     Comments: No acute distress.  HENT:     Head: Normocephalic and atraumatic.     Comments: Tolerating secretions without difficulty. Neck:     Musculoskeletal: Neck supple.  Pulmonary:     Effort: Pulmonary effort is normal.     Comments: Speaking in complete, fluent sentences.  No increased work of breathing. Neurological:     Mental Status: He is alert.     Cranial Nerves: No cranial nerve deficit.  Psychiatric:        Behavior: Behavior normal.     ED Course/Procedures     Procedures  MDM   68 year old male received at signout from Dr. Sherry Ruffing pending labs.  Please see his note for further work-up and medical decision making.  Has a mild hyponatremia of 133, but otherwise labs are unremarkable.  He tolerated p.o. fluids  without difficulty.  As discussed in Dr. Doreene Burke plan, we will discharge the patient to home on a full liquid diet with outpatient follow-up to gastroenterology.  All questions answered and patient is agreeable to plan at this time.  Strict ER return precautions given.  He is hemodynamically stable and in no acute distress.  Safe for discharge to home with outpatient follow-up.       Joanne Gavel, PA-C 05/29/19 0019    Tegeler, Gwenyth Allegra, MD 05/29/19 [phone]

## 2019-05-28 NOTE — ED Notes (Signed)
Patient ambulatory to CT

## 2019-05-28 NOTE — ED Triage Notes (Signed)
Patient was eating an [employer] sandwich about 40 minutes ago and started choking. Full obstruction with fire so they performed three chest thrusts. Chest thrusts were successful but possible partial obstruction currently. Patient is passing air, but wheezing and stridor noted with EMS in the upper quadrants. Patient reports having breathing trouble for the last three days. Patient has a history of COPD and asthma.  Patient ambulatory to restroom with no assistance. No acute distress noted at this time.    EMS vitals: BP 174/88 HR 88 RR 20 SpO2 98%  CBG 108

## 2019-05-28 NOTE — ED Notes (Signed)
Patient ambulated to restroom with no assistance.  

## 2019-05-28 NOTE — ED Notes (Signed)
Patient made aware that urine sample is needed.  

## 2019-05-29 NOTE — Discharge Instructions (Addendum)
Thank you for allowing me to care for you today in the Emergency Department.   Follow a full liquid diet until you are seen by gastroenterology.  Dietary information is attached along with your discharge paperwork.  Return to the ER if you have another choking episode, if you develop respiratory distress, feel as if your throat is closing, or if you develop other new, concerning symptoms.

## 2019-06-04 ENCOUNTER — Telehealth: Payer: Self-pay

## 2019-06-04 MED ORDER — IPRATROPIUM-ALBUTEROL 0.5-2.5 (3) MG/3ML IN SOLN: RESPIRATORY_TRACT | 1 refills | Status: DC

## 2019-06-04 NOTE — Telephone Encounter (Signed)
Patient is calling requesting a refill on duoneb to [facility].   Thanks

## 2019-06-09 ENCOUNTER — Telehealth: Payer: Self-pay

## 2019-06-09 NOTE — Telephone Encounter (Signed)
Patient called to request information about medications. He was recently in the hospital for a choking episode. He had a question on what medications he should be using. I let him know that he should be using the prescribed medications for the nebulizer as instructed at his visit with Dr. Ernst Bowler. He told me that he never received the medications. I told him to just do as instructed by the hospital (atrovent inhaler, albuterol inhaler) as well as the Spiriva (Dr. Ernst Bowler did give verbal okay for this) until he receives him medications from [facility]. Patient verbalized understanding of this. I told him that in the future if he is expecting to receive something like medication from a pharmacy to give [name] a call because we do not always know when things fail to go through correctly or if further information is needed prior to completion of orders. He verbalized understanding and will do as instructed. I called and spoke with [facility]. They needed insurance information and had previously left a message for the patient to call back with this information. I gave the pharmacy all needed insurance information as well as verification of phone number for patient. I let her know that if someone could call the patient today he should answer as he is expecting a call from their pharmacy. She said okay and will contact patient accordingly.

## 2019-06-09 NOTE — Telephone Encounter (Signed)
Agree with the plan.   [name], MD [facility] [place]

## 2019-06-11 MED ORDER — YUPELRI 175 MCG/3ML IN SOLN: 1.0000 | Freq: Every day | RESPIRATORY_TRACT | 1 refills | Status: DC

## 2019-06-11 MED ORDER — BUDESONIDE 0.5 MG/2ML IN SUSP: 0.5000 mg | Freq: Two times a day (BID) | RESPIRATORY_TRACT | 1 refills | Status: DC

## 2019-06-11 MED ORDER — BROVANA 15 MCG/2ML IN NEBU: 15.0000 ug | INHALATION_SOLUTION | Freq: Two times a day (BID) | RESPIRATORY_TRACT | 1 refills | Status: DC

## 2019-06-11 NOTE — Addendum Note (Signed)
Addended by: Lucrezia Starch I on: 06/11/2019 01:24 PM   Modules accepted: Orders

## 2019-06-11 NOTE — Telephone Encounter (Signed)
I spoke with [name] today about the cost of his medications. He told me that if purchased from [facility] then he would have to pay a very large amount of money. After speaking with patient I let him know that I have sent his medications to [facility] and will await feedback from them regarding the cost for the patient. He was agreeable with this plan.

## 2019-06-12 ENCOUNTER — Telehealth: Payer: Self-pay | Admitting: Allergy & Immunology

## 2019-06-12 NOTE — Telephone Encounter (Signed)
Verbal discussion with Dr. Ernst Bowler about meds. We are going to supply samples of Yupelri and have patient get Brovana and Pulmicort from [facility].

## 2019-06-12 NOTE — Telephone Encounter (Signed)
Error

## 2019-06-12 NOTE — Telephone Encounter (Signed)
Called patient and received no answer. I was not able to leave a message.

## 2019-06-15 ENCOUNTER — Telehealth: Payer: Self-pay | Admitting: *Deleted

## 2019-06-15 NOTE — Telephone Encounter (Signed)
I spoke with patient. He informed me that he is willing to pay for the medications if he absolutely has to. I let him know that we would help him out with samples of the Yupelri so that he would not have as much out of pocket cost. He was very appreciative of this. I am giving him a few samples of Yupelri to get him through and if he needs more will instruct to call us back. Dr. Ernst Bowler please sign the forms in your office upon return to [place] so that we can get the paperwork faxed. Thank you so much!

## 2019-06-15 NOTE — Telephone Encounter (Signed)
Note combined with K. Ilissa Rosner, CMA note on same

## 2019-06-15 NOTE — Telephone Encounter (Signed)
Called patient again.  No answer.

## 2019-06-15 NOTE — Telephone Encounter (Signed)
Per A. Fernandez-Vernon, CMA Note: Paperwork that needs to be signed by Dr. Ernst Bowler from [place] regarding Garlon Hatchet and Maretta Bees has been placed on a clipboard and has been placed in his office for him to sign so that it can be faxed back.

## 2019-06-15 NOTE — Telephone Encounter (Signed)
Paperwork that needs to be signed by Dr. Ernst Bowler from [place] regarding Garlon Hatchet and Maretta Bees has been placed on a clipboard and has been placed in his office for him to sign so that it can be faxed back.

## 2019-06-16 NOTE — Telephone Encounter (Signed)
Thank you for adopting [name]!   [name], MD [facility] [place]

## 2019-06-17 NOTE — Telephone Encounter (Signed)
Patient returned call on 06-16-2019. He has received the [place]. He will be coming to pick up the samples of Yupelri this week.

## 2019-06-19 ENCOUNTER — Telehealth: Payer: Self-pay | Admitting: *Deleted

## 2019-06-19 NOTE — Telephone Encounter (Signed)
I talked to Tammy this afternoon.  We are going to set him up for an office visit on the day of his first injection so that he can have a room devoted to himself.  We could also keep him for an hour instead of 30 minutes after the dose.  We could also predose him with cetirizine 10 mg right before the injection.  [name], MD [facility] [place]

## 2019-06-19 NOTE — Telephone Encounter (Signed)
I had reached out to patient regarding [place] and starting therapy. Upon investigation bu [facility] it seems someone had signed patient up for extra help (he advised in March 2020) and so his Rx through the specialty pharmacy should now only cost him $8 a month.  Upon discussing this with patient about submitting he has advised me his does want to start therapy but is nervous about Rx due to episode he had with Berna Bue back in Nov 2018 where he broke out and was itchy for about 12 hours.  I advised patient I would reach out to Dr Ernst Bowler and see what he suggests regarding patient getting the initial dose as far as wait time? Premeds? Etc.Marland KitchenMarland Kitchen

## 2019-06-23 NOTE — Telephone Encounter (Signed)
I will reach out and advise patient of same

## 2019-06-29 ENCOUNTER — Other Ambulatory Visit: Payer: Self-pay

## 2019-06-29 ENCOUNTER — Emergency Department (HOSPITAL_COMMUNITY): Payer: Medicare HMO

## 2019-06-29 ENCOUNTER — Emergency Department (HOSPITAL_COMMUNITY)
Admission: EM | Admit: 2019-06-29 | Discharge: 2019-06-29 | Disposition: A | Discharge status: Home / Self Care | Payer: Medicare HMO | Attending: Emergency Medicine | Admitting: Emergency Medicine

## 2019-06-29 DIAGNOSIS — S2096XA Insect bite (nonvenomous) of unspecified parts of thorax, initial encounter: Secondary | ICD-10-CM | POA: Insufficient documentation

## 2019-06-29 DIAGNOSIS — R0789 Other chest pain: Secondary | ICD-10-CM | POA: Diagnosis present

## 2019-06-29 DIAGNOSIS — E119 Type 2 diabetes mellitus without complications: Secondary | ICD-10-CM | POA: Insufficient documentation

## 2019-06-29 DIAGNOSIS — J449 Chronic obstructive pulmonary disease, unspecified: Secondary | ICD-10-CM | POA: Diagnosis not present

## 2019-06-29 DIAGNOSIS — Y939 Activity, unspecified: Secondary | ICD-10-CM | POA: Insufficient documentation

## 2019-06-29 DIAGNOSIS — Y999 Unspecified external cause status: Secondary | ICD-10-CM | POA: Diagnosis not present

## 2019-06-29 DIAGNOSIS — Y929 Unspecified place or not applicable: Secondary | ICD-10-CM | POA: Insufficient documentation

## 2019-06-29 DIAGNOSIS — T63481A Toxic effect of venom of other arthropod, accidental (unintentional), initial encounter: Secondary | ICD-10-CM

## 2019-06-29 DIAGNOSIS — W57XXXA Bitten or stung by nonvenomous insect and other nonvenomous arthropods, initial encounter: Secondary | ICD-10-CM | POA: Insufficient documentation

## 2019-06-29 DIAGNOSIS — Z87891 Personal history of nicotine dependence: Secondary | ICD-10-CM | POA: Diagnosis not present

## 2019-06-29 DIAGNOSIS — Z79899 Other long term (current) drug therapy: Secondary | ICD-10-CM | POA: Insufficient documentation

## 2019-06-29 DIAGNOSIS — I10 Essential (primary) hypertension: Secondary | ICD-10-CM | POA: Diagnosis not present

## 2019-06-29 LAB — BASIC METABOLIC PANEL
Anion gap: 8 (ref 5–15)
BUN: 15 mg/dL (ref 8–23)
CO2: 25 mmol/L (ref 22–32)
Calcium: 8.9 mg/dL (ref 8.9–10.3)
Chloride: 104 mmol/L (ref 98–111)
Creatinine, Ser: 1.18 mg/dL (ref 0.61–1.24)
GFR calc Af Amer: >60 mL/min (ref >60)
GFR calc non Af Amer: >60 mL/min (ref >60)
Glucose, Bld: 100 mg/dL — ABNORMAL HIGH (ref 70–99)
Potassium: 3.8 mmol/L (ref 3.5–5.1)
Sodium: 137 mmol/L (ref 135–145)

## 2019-06-29 LAB — CBC
HCT: 41.5 % (ref 39.0–52.0)
Hemoglobin: 13.6 g/dL (ref 13.0–17.0)
MCH: 32.5 pg (ref 26.0–34.0)
MCHC: 32.8 g/dL (ref 30.0–36.0)
MCV: 99 fL (ref 80.0–100.0)
Platelets: 226 10*3/uL (ref 150–400)
RBC: 4.19 MIL/uL — ABNORMAL LOW (ref 4.22–5.81)
RDW: 12.7 % (ref 11.5–15.5)
WBC: 8 10*3/uL (ref 4.0–10.5)
nRBC: 0 % (ref 0.0–0.2)

## 2019-06-29 LAB — TROPONIN I (HIGH SENSITIVITY)
Troponin I (High Sensitivity): 6 ng/L (ref <18)
Troponin I (High Sensitivity): 8 ng/L (ref <18)

## 2019-06-29 MED ORDER — METHYLPREDNISOLONE SODIUM SUCC 125 MG IJ SOLR
125.0000 mg | Freq: Once | INTRAMUSCULAR | Status: AC
  Administered 2019-06-29: 125 mg via INTRAVENOUS
  Filled 2019-06-29: qty 2

## 2019-06-29 MED ORDER — DIPHENHYDRAMINE HCL 50 MG/ML IJ SOLN
25.0000 mg | Freq: Once | INTRAMUSCULAR | Status: AC
  Administered 2019-06-29: 25 mg via INTRAVENOUS
  Filled 2019-06-29: qty 1

## 2019-06-29 NOTE — ED Notes (Signed)
Gave pt urinal 

## 2019-06-29 NOTE — ED Provider Notes (Signed)
  Provider Note MRN:  [id number]  Arrival date & time: 06/29/19    ED Course and Medical Decision Making  Assumed care from Dr. Ashok Cordia at shift change.  Chest pain after insect sting, will obtain 2 troponins, anticipating dc.  6:30 PM update: Second troponin is negative as well.  Patient is feeling well, appropriate for discharge.  Repeat examination of the skin is largely unremarkable, no significant erythema, mild edema noted at the questionable bite site.   Final Clinical Impressions(s) / ED Diagnoses     ICD-10-CM   1. Insect stings, accidental or unintentional, initial encounter  T63.481A   2. Chest tightness  R07.89     ED Discharge Orders    None        Discharge Instructions     It was our pleasure to provide your ER care today - we hope that you feel better.  Take benadryl 25 mg every 6 hours as need.   In event of a future severe allergic reaction to bee sting (I.e. throat closing off, feeling faint, unable to breath) - use epipen as directed, take benadryl 50 mg, and seek medical attention right away.  For chest discomfort, follow up with your doctor/cardiologist in the next 1-2 weeks - call office to arrange appointment.   Return to ER if worse, new symptoms, increased trouble breathing, recurrent or persistent chest pain, or other concern.     Barth Kirks. Sedonia Small, [place] [email or web address]    Maudie Flakes, MD 06/29/19 Bosie Helper

## 2019-06-29 NOTE — ED Triage Notes (Signed)
Brought in by [facility]. States he got stung by a bee, after being stung he became clammy, lightheaded, and nauseated. About 20 min after being stung he started having shortness of breath and chest tightness and pressure. EMS administered 4mg  zofran and 1 nitro. Had some relief from nitro

## 2019-06-29 NOTE — ED Notes (Signed)
Removed IV from pt's left lower forearm. IV site was clean, dry, and intact. IV removed per, Dr. Sedonia Small.

## 2019-06-29 NOTE — ED Notes (Signed)
Pt verbalized understanding of d/c instructions and has no further questions, VSS, NAD.  

## 2019-06-29 NOTE — ED Provider Notes (Signed)
[facility] EMERGENCY DEPARTMENT Provider Note   CSN: [id number] Arrival date & time: 06/29/19  1346     History   Chief Complaint Chief Complaint  Patient presents with  . Chest Pain    HPI [name] is a 68 y.o. male.     Patient c/o working in barn, and getting stung by something in mid back - he said it felt like bee sting. States went inside to see what had stung him and began to feel clammy, lightheaded, tight in chest. States w his copd always feels sob, and denies new or worsening sob. No throat closing. No hx of allergic rxn to stings or bites in past. States called EMS, was given ntg x 2 and transported to ED. Occasional pvcs on EMS ecg, otherwise neg acute. Patient states all day, including just prior to feeling the bite/sting felt at baseline, no cp or discomfort, no fever, no increased cough or sob. Patient denies palpitations. No syncope.   The history is provided by the patient and the EMS personnel.  Chest Pain Associated symptoms: no abdominal pain, no back pain, no cough, no fever, no headache, no palpitations and no vomiting     Past Medical History:  Diagnosis Date  . Anxiety disorder   . Arthritis    BILATERAL SHOULDERS, ELBOWS AND HANDS AND LEFT HIP AND KNEES--HX CORTISONE SHOTS IN SHOULDERS, ELBOWS, HIP AND KNEES  . Basal cell carcinoma of back 04/30/2016   Dermatologist- Dr Nevada Crane;   MOHS sx- Dr Levada Dy   . Bladder outlet obstruction   . Chronic idiopathic constipation 05/08/2015  . Complication of anesthesia    DIFFICULT WAKING   . COPD (chronic obstructive pulmonary disease) [place] C Frequent exacerbations 01/13/2014   [name] 07/08/14: FeV1 51% FeV1/FVC 66% FVC 59% 10/5/2015ONO RA was normal  10/13/2014  ONO on RA NORMAL   . COPD, frequent exacerbations ([place])    pulmologist-  dr Joya Gaskins--  [place] Rm Stage C.04-25-15 recent COPD exacerbation-much improved now, after tx. in ER [facility].  Marland Kitchen Depression   . Diabetes mellitus without  complication ([place])    BODERLINE - DIET CONTROL  . Dysrhythmia    PVC'S  . Emphysema lung ([place])    stage 2  . Family history of adverse reaction to anesthesia    father would wake up with agitation   . Former smoker 01/13/2014  . Gastroesophageal reflux disease without esophagitis   . Heavy alcohol use 04/30/2016  . History of chronic bronchitis   . History of oxygen administration    oxygen use 2 l/m nasally at bedtime and exertional occasions  . History of rheumatic fever   . History of TB (tuberculosis)    1984--  hospitalized for 4 month treatment  . History of urinary retention   . Hx of multiple concussions    x 2 per patient   . Hypertension   . Melanoma ([place])   . Nocturnal oxygen desaturation    USES O2 NIGHTLY  . OSA (obstructive sleep apnea) 01/13/2014  . PONV (postoperative nausea and vomiting)   . Post-polio syndrome    polio at age 71--PT WAS IN IRON LUNG; PT WAS IN W/C UNTIL AGE 12; STILL HAS WEAKNESS RIGHT SIDE  . Prostate cancer ([place])   . Schizophrenia ([place])   . Tuberculosis    Hosp 4 months rx , left early   . Urticaria     Patient Active Problem List   Diagnosis Date Noted  . COPD with  acute exacerbation ([place]) 12/04/2018  . PSVT (paroxysmal supraventricular tachycardia) ([place]) 09/23/2018  . Seasonal and perennial allergic rhinitis 07/30/2018  . COPD with asthma ([place]) 07/07/2018  . S/P arthroscopy of right shoulder 04/25/2017  . Generalized anxiety disorder 04/22/2017  . Severe persistent asthma without complication 99991111  . Bee sting-induced anaphylaxis 02/12/2017  . Anaphylactic reaction due to food, initial encounter 02/12/2017  . Current use of beta blocker 02/12/2017  . Allergic rhinitis 01/15/2017  . COPD exacerbation ([place]) 11/04/2016  . Asthma exacerbation 11/03/2016  . Nausea without vomiting 09/17/2016  . Shortness of breath   . Diabetes mellitus type 2 in obese ([place]) 06/26/2016  . non-specific Chest pain 06/12/2016  . Patient's  noncompliance with other medical treatment and regimen 06/02/2016  . Hyperlipidemia, mixed 06/02/2016  . Elevated liver enzymes  06/02/2016  . Schizophrenia ([place]) 04/30/2016  . Environmental and seasonal allergies 04/30/2016  . Diabetes mellitus without complication- (diet controlled; w/o proteinuria) 04/30/2016  . Heavy alcohol use- Many yrs  04/30/2016  . Basal cell carcinoma of back 04/30/2016  . Obesity 04/30/2016  . Hx of multiple concussions 04/30/2016  . h/o Vitamin D deficiency 04/30/2016  . Screen for sexually transmitted diseases 04/30/2016  . Immunocompromised state ([place]) 04/30/2016  . Cervical radiculopathy 01/15/2016  . Alcohol withdrawal ([place]) 11/22/2015  . Alcohol abuse with alcohol-induced mood disorder ([place]) 11/22/2015  . h/o Suicidal thoughts 11/21/2015  . Dry mouth 10/07/2015  . Anxiety 09/19/2015  . OAB (overactive bladder) 07/27/2015  . Chronic idiopathic constipation 05/08/2015  . Screening for colon cancer   . Benign neoplasm of cecum   . Benign neoplasm of ascending colon   . Benign neoplasm of transverse colon   . Benign neoplasm of sigmoid colon   . Rectal polyp   . Gastroesophageal reflux disease without esophagitis   . Esophageal stricture   . Joint stiffness of multiple sites 10/17/2014  . Benign localized hyperplasia of prostate with urinary obstruction 09/29/2014  . concerns for memory loss 09/13/2014  . Tuberculosis   . Diverticulum of bladder 06/17/2014  . Acute urinary retention 05/31/2014  . Neuropathy ([place]) 04/11/2014  . Post-polio syndrome 01/13/2014  . Other emphysema ([place]) 01/13/2014  . Hypertension 01/13/2014  . Former smoker 01/13/2014  . h/o Prostate cancer 01/13/2014  . History of tuberculosis 01/13/2014  . Bipolar disorder ([place]) 01/13/2014  . Explosive personality disorder ([place]) 01/13/2014  . Erectile dysfunction 01/13/2014  . Nephrolithiasis 01/13/2014  . Chronic pain 01/13/2014  . Obstructive sleep apnea 01/13/2014  .  Alcoholic gastritis 99991111  . Nocturnal hypoxia 08/10/2013    Past Surgical History:  Procedure Laterality Date  . CARDIOVASCULAR STRESS TEST  06-08-2014  dr Mare Ferrari   normal lexiscan study/  no ischemia/  not gated due to PAC's  . COLONOSCOPY N/A 05/03/2015   Procedure: COLONOSCOPY;  Surgeon: [name], MD;  Location: [facility];  Service: Endoscopy;  Laterality: N/A;  . CYSTOSCOPY N/A 10/25/2015   Procedure: CYSTOSCOPY;  Surgeon: [name], MD;  Location: [facility];  Service: Urology;  Laterality: N/A;  . CYSTOSCOPY W/ CYSTOGRAM/  TRANSRECTAL ULTRASOUND PROSTATE BX  03-22-2009  . ESOPHAGOGASTRODUODENOSCOPY N/A 03/22/2015   Procedure: ESOPHAGOGASTRODUODENOSCOPY (EGD) with dilation;  Surgeon: [name], MD;  Location: [facility];  Service: Endoscopy;  Laterality: N/A;  . excision of skin lesion    . LAPAROSCOPIC CHOLECYSTECTOMY  2013  . left elbow surgery      due to fracture   . NASAL SEPTUM SURGERY  2000  .  OTHER SURGICAL HISTORY      Muscle & bone Graft/Polio  . polio surgeries      14 polio surgeries   . PROSTATE BIOPSY N/A 09/28/2014   Procedure: PROSTATE ULTRASOUND/BIOPSY;  Surgeon: [name], MD;  Location: [facility];  Service: Urology;  Laterality: N/A;  . PROSTATE BIOPSY N/A 10/25/2015   Procedure: PROSTATE BIOPSY AND ULTRASOUND;  Surgeon: [name], MD;  Location: [facility];  Service: Urology;  Laterality: N/A;  . SAVORY DILATION N/A 03/22/2015   Procedure: SAVORY DILATION;  Surgeon: [name], MD;  Location: [facility];  Service: Endoscopy;  Laterality: N/A;  . SHOULDER ARTHROSCOPY WITH OPEN ROTATOR CUFF REPAIR Bilateral 2013  &  1999   removal spurs and bursectomy  . TRANSURETHRAL INCISION OF BLADDER NECK N/A 10/25/2015   Procedure:  TRANSURETHRAL INCISION OF BLADDER NECK;  Surgeon: [name], MD;  [facility];  Service: Urology;  Laterality: N/A;  . TRANSURETHRAL RESECTION OF PROSTATE N/A 09/28/2014   Procedure: TRANSURETHRAL RESECTION OF THE PROSTATE  (TURP);  Surgeon: [name], MD;  Location[facility];  Service: Urology;  Laterality: N/A;  . URETEROSOPY STONE EXTRACTION  2000        Home Medications    Prior to Admission medications   Medication Sig Start Date End Date Taking? Authorizing Provider  albuterol (PROAIR HFA) 108 (90 Base) MCG/ACT inhaler Inhale 2 puffs into the lungs every 4 (four) hours as needed for wheezing or shortness of breath. 06/09/18   Valentina Shaggy, MD  albuterol (PROVENTIL) (2.5 MG/3ML) 0.083% nebulizer solution Take 2.5 mg by nebulization every 6 (six) hours as needed for wheezing or shortness of breath.    [provider]  arformoterol (BROVANA) 15 MCG/2ML NEBU Take 2 mLs (15 mcg total) by nebulization 2 (two) times daily. 06/11/19   Valentina Shaggy, MD  bisoprolol (ZEBETA) 5 MG tablet Take 1 tablet (5 mg total) by mouth daily. 12/10/18   [name], FNP  budesonide (PULMICORT) 0.5 MG/2ML nebulizer solution Take 2 mLs (0.5 mg total) by nebulization 2 (two) times daily. 06/11/19   Valentina Shaggy, MD  esomeprazole (NEXIUM) 40 MG capsule Take 40 mg by mouth daily at 12 noon.    [provider]  ipratropium-albuterol (DUONEB) 0.5-2.5 (3) MG/3ML SOLN USE 1 VIAL IN THE NEBULIZER EVERY 4-6 HOURS AS NEEDED FOR COUGH OR WHEEZE 06/04/19   Valentina Shaggy, MD  loratadine (CLARITIN) 10 MG tablet Take 1 tablet (10 mg total) by mouth daily as needed for allergies. 05/22/19   Valentina Shaggy, MD  montelukast (SINGULAIR) 10 MG tablet Take 1 tablet (10 mg total) by mouth at bedtime. 05/22/19   Valentina Shaggy, MD  nitroGLYCERIN (NITROSTAT) 0.4 MG SL tablet Place 1 tablet (0.4 mg total) under the tongue every 5 (five) minutes as needed for chest pain. Patient not taking: Reported on 05/21/2019 06/21/15   Brunetta Jeans, PA-C  omeprazole (PRILOSEC) 40 MG capsule Take 1 capsule (40 mg total) by mouth daily. 05/22/19   Valentina Shaggy, MD  OXYGEN Inhale 2 L into the lungs at  bedtime. DURING THE DAY IF NEEDED Reported on 11/25/2015    [provider]  Revefenacin (YUPELRI) 175 MCG/3ML SOLN Inhale 1 vial into the lungs daily. 06/11/19   Valentina Shaggy, MD    Family History Family History  Problem Relation Age of Onset  . Alzheimer's disease Father 4       Deceased  . Stomach  cancer Father   . Heart attack Father   . Heart disease Father   . Skin cancer Mother        Facial-Living  . Alcohol abuse Sister        x2  . Mental illness Sister        x2  . Alcohol abuse Sister   . Diabetes Maternal Aunt        x2  . Thyroid disease Maternal Aunt        x4  . Diabetes Maternal Uncle   . Tuberculosis Paternal Grandfather   . Tuberculosis Paternal Grandmother   . Alzheimer's disease Paternal Aunt   . Alzheimer's disease Paternal Uncle   . Colon cancer Neg Hx   . Colon polyps Neg Hx   . Crohn's disease Neg Hx   . Ulcerative colitis Neg Hx   . Allergic rhinitis Neg Hx   . Angioedema Neg Hx   . Asthma Neg Hx   . Eczema Neg Hx   . Urticaria Neg Hx     Social History Social History   Tobacco Use  . Smoking status: Former Smoker    Years: 47.00    Types: Pipe    Quit date: 08/25/2013    Years since quitting: 5.8  . Smokeless tobacco: Former Systems developer    Types: Chew  . Tobacco comment: 5-6 times smoking a pipe  Substance Use Topics  . Alcohol use: Yes    Alcohol/week: 14.0 standard drinks    Types: 14 Cans of beer per week    Comment: 2 cans daily  . Drug use: No     Allergies   Aspirin, Hornet venom, Ivp dye [iodinated diagnostic agents], Levaquin [levofloxacin in d5w], Nsaids, Penicillins, Tolmetin, Fasenra [benralizumab], Perforomist [formoterol], Buprenorphine hcl, Morphine and related, and Oxycodone   Review of Systems Review of Systems  Constitutional: Negative for fever.  HENT: Negative for sore throat.   Eyes: Negative for redness.  Respiratory: Negative for cough.   Cardiovascular: Positive for chest pain. Negative  for palpitations and leg swelling.  Gastrointestinal: Negative for abdominal pain, diarrhea and vomiting.  Genitourinary: Negative for flank pain.  Musculoskeletal: Negative for back pain and neck pain.  Skin: Negative for rash.  Neurological: Negative for headaches.  Hematological: Does not bruise/bleed easily.  Psychiatric/Behavioral: Negative for confusion.     Physical Exam Updated Vital Signs BP (!) 153/74 (BP Location: Right Arm)   Pulse 67   Temp 97.8 F (36.6 C) (Oral)   Resp 18   Ht 1.829 m (6')   Wt 106.1 kg   SpO2 96%   BMI 31.74 kg/m   Physical Exam Vitals signs and nursing note reviewed.  Constitutional:      Appearance: Normal appearance. He is well-developed.  HENT:     Head: Atraumatic.     Nose: Nose normal.     Mouth/Throat:     Mouth: Mucous membranes are moist.     Pharynx: Oropharynx is clear. No oropharyngeal exudate or posterior oropharyngeal erythema.  Eyes:     General: No scleral icterus.    Conjunctiva/sclera: Conjunctivae normal.     Pupils: Pupils are equal, round, and reactive to light.  Neck:     Musculoskeletal: Normal range of motion and neck supple. No neck rigidity or muscular tenderness.     Trachea: No tracheal deviation.  Cardiovascular:     Rate and Rhythm: Normal rate and regular rhythm.     Pulses: Normal pulses.     Heart  sounds: Normal heart sounds. No murmur. No friction rub. No gallop.   Pulmonary:     Effort: Pulmonary effort is normal. No accessory muscle usage or respiratory distress.     Breath sounds: Normal breath sounds.  Abdominal:     General: Bowel sounds are normal. There is no distension.     Palpations: Abdomen is soft. There is no mass.     Tenderness: There is no abdominal tenderness. There is no guarding.  Genitourinary:    Comments: No cva tenderness. Musculoskeletal:        General: No swelling or tenderness.     Right lower leg: No edema.     Left lower leg: No edema.  Skin:    General: Skin is  warm and dry.     Findings: No rash.     Comments: Area of slight swelling/erythema to midback few cm diameter, possible from bite/sting. No stinger or fb present.   Neurological:     Mental Status: He is alert.     Comments: Alert, speech clear. Motor/sens grossly intact bil.   Psychiatric:        Mood and Affect: Mood normal.      ED Treatments / Results  Labs (all labs ordered are listed, but only abnormal results are displayed) Results for orders placed or performed during the hospital encounter of 05/28/19  CBC with Differential  Result Value Ref Range   WBC 7.3 4.0 - 10.5 K/uL   RBC 4.34 4.22 - 5.81 MIL/uL   Hemoglobin 14.4 13.0 - 17.0 g/dL   HCT 42.7 39.0 - 52.0 %   MCV 98.4 80.0 - 100.0 fL   MCH 33.2 26.0 - 34.0 pg   MCHC 33.7 30.0 - 36.0 g/dL   RDW 12.7 11.5 - 15.5 %   Platelets 233 150 - 400 K/uL   nRBC 0.0 0.0 - 0.2 %   Neutrophils Relative % 73 %   Neutro Abs 5.3 1.7 - 7.7 K/uL   Lymphocytes Relative 16 %   Lymphs Abs 1.2 0.7 - 4.0 K/uL   Monocytes Relative 7 %   Monocytes Absolute 0.5 0.1 - 1.0 K/uL   Eosinophils Relative 3 %   Eosinophils Absolute 0.2 0.0 - 0.5 K/uL   Basophils Relative 0 %   Basophils Absolute 0.0 0.0 - 0.1 K/uL   Immature Granulocytes 1 %   Abs Immature Granulocytes 0.09 (H) 0.00 - 0.07 K/uL  Comprehensive metabolic panel  Result Value Ref Range   Sodium 133 (L) 135 - 145 mmol/L   Potassium 4.3 3.5 - 5.1 mmol/L   Chloride 102 98 - 111 mmol/L   CO2 24 22 - 32 mmol/L   Glucose, Bld 113 (H) 70 - 99 mg/dL   BUN 13 8 - 23 mg/dL   Creatinine, Ser 0.95 0.61 - 1.24 mg/dL   Calcium 9.0 8.9 - 10.3 mg/dL   Total Protein 7.1 6.5 - 8.1 g/dL   Albumin 3.9 3.5 - 5.0 g/dL   AST 27 15 - 41 U/L   ALT 38 0 - 44 U/L   Alkaline Phosphatase 44 38 - 126 U/L   Total Bilirubin 0.4 0.3 - 1.2 mg/dL   GFR calc non Af Amer >60 >60 mL/min   GFR calc Af Amer >60 >60 mL/min   Anion gap 7 5 - 15    EKG None  Radiology No results found.  Procedures  Procedures (including critical care time)  Medications Ordered in ED Medications  methylPREDNISolone sodium succinate (SOLU-MEDROL)  125 mg/2 mL injection 125 mg (has no administration in time range)  diphenhydrAMINE (BENADRYL) injection 25 mg (has no administration in time range)     Initial Impression / Assessment and Plan / ED Course  I have reviewed the triage vital signs and the nursing notes.  Pertinent labs & imaging results that were available during my care of the patient were reviewed by me and considered in my medical decision making (see chart for details).  Iv ns. Solumedrol iv. Benadryl iv. Continuous pulse ox and monitor. Ecg. Labs.  EMS ecg with occ pvc, but no acute st changes.   Reviewed nursing notes and prior charts for additional history.   Today's workup remains pending - hs trop x 2, cxr, etc. - signed pt out to Dr Sedonia Small, to check cxr, labs, ecg, recheck pt, and dispo appropriately.   Recheck pt, comfortable, no distress, no throat closing, no itching, breathing at baseline, no chest pain.     Final Clinical Impressions(s) / ED Diagnoses   Final diagnoses:  None    ED Discharge Orders    None       Lajean Saver, MD 06/29/19 1514

## 2019-06-29 NOTE — Discharge Instructions (Addendum)
It was our pleasure to provide your ER care today - we hope that you feel better.  Take benadryl 25 mg every 6 hours as need.   In event of a future severe allergic reaction to bee sting (I.e. throat closing off, feeling faint, unable to breath) - use epipen as directed, take benadryl 50 mg, and seek medical attention right away.  For chest discomfort, follow up with your doctor/cardiologist in the next 1-2 weeks - call office to arrange appointment.   Return to ER if worse, new symptoms, increased trouble breathing, recurrent or persistent chest pain, or other concern.

## 2019-07-01 ENCOUNTER — Telehealth: Payer: Self-pay | Admitting: *Deleted

## 2019-07-01 ENCOUNTER — Telehealth: Payer: Self-pay

## 2019-07-01 DIAGNOSIS — T63481D Toxic effect of venom of other arthropod, accidental (unintentional), subsequent encounter: Secondary | ICD-10-CM

## 2019-07-01 NOTE — Addendum Note (Signed)
Addended by: Valentina Shaggy on: 07/01/2019 11:21 PM   Modules accepted: Orders

## 2019-07-01 NOTE — Telephone Encounter (Signed)
Pt calling today to report that he was seen in the ER for an insect sting that happened in his barn.  Pt is unsure to what he was stung by, but is asking for an Epipen.  Pt reported nausea, feeling hot, slurred speech, lips and mouth dry, tongue swelling, had trouble swallowing, chest pain, he states that he felt really bad.  Called the ambulance and he reports that B/P was 180/80 then went up to 210/90, was told that he needed to go to the ER.  Was not given Epinephrine by the ambulance, per patient report.  Please see ER note.  Explained to patient that I would call him back with Dr's recommendations.  Does he need to be seen?

## 2019-07-01 NOTE — Telephone Encounter (Signed)
L/m for patient advising that his [name] is due to be delivered on 9/17 and he will need to make appt with Dr Ernst Bowler to get his Nucala started per MD.  He already has appt on 10/5 for followup that he can reschedule if he wants to start Nucala sooner.

## 2019-07-01 NOTE — Telephone Encounter (Signed)
We need to give him an EpiPen anyway since he is starting Nucala. I am ordering a stinging insect panel as well.  [name], MD Allergy and [facility] [facility] [place]

## 2019-07-02 ENCOUNTER — Telehealth: Payer: Self-pay | Admitting: Allergy & Immunology

## 2019-07-02 NOTE — Telephone Encounter (Signed)
Patient called back and unfortunately there was not an available nurse. Returned patient's phone call and left a voicemail asking to return call.

## 2019-07-02 NOTE — Telephone Encounter (Signed)
Pt was returning your phone call. [phone].

## 2019-07-02 NOTE — Telephone Encounter (Signed)
Called patient and left a voicemail asking to return call to determine which pharmacy to send the EpiPen to. Need to advise to patient that he can come in and get blood work done for stinging insect panel.

## 2019-07-03 MED ORDER — EPINEPHRINE 0.3 MG/0.3ML IJ SOAJ: 0.3000 mg | INTRAMUSCULAR | 2 refills | Status: AC | PRN

## 2019-07-03 NOTE — Telephone Encounter (Signed)
Spoke with patient and he will be coming in on Monday to get blood work done for stinging inset panel. He also has new insurance, [employer], that is starting October 1st. He will be bringing in his new card to be scanned in.

## 2019-07-03 NOTE — Addendum Note (Signed)
Addended by: Chip Boer R on: 07/03/2019 11:22 AM   Modules accepted: Orders

## 2019-07-03 NOTE — Telephone Encounter (Signed)
Left message for patient to call the office

## 2019-07-08 ENCOUNTER — Ambulatory Visit: Payer: Medicare HMO | Admitting: Internal Medicine

## 2019-07-08 ENCOUNTER — Telehealth: Payer: Self-pay

## 2019-07-08 NOTE — Telephone Encounter (Signed)
PA for Maretta Bees was done and completed on [email or web address] awaiting approval.

## 2019-07-08 NOTE — Telephone Encounter (Signed)
Pharmacist for [facility] called stated PA is need for [facility] for medicare part D since it went through medicare part B and it will not cover. Patient's Part D wont cover unless PA is done.

## 2019-07-09 NOTE — Telephone Encounter (Signed)
Yupelri solution was approved through [employer] form 03/16/2019-12-31/2020

## 2019-07-10 NOTE — Telephone Encounter (Signed)
[name] was denied under his Medicare Part D plan. Per fax it was determined that the coverage of this drug is eligible through Medicare Part B.

## 2019-07-10 NOTE — Telephone Encounter (Signed)
That is fantastic news. I will forward to her personal Allergy Medical Assistant - Lonn Georgia - so that she is aware.  [name], MD Allergy and [place] [facility] [place]

## 2019-07-14 NOTE — Telephone Encounter (Signed)
Received PA form from [place]. Completed and placed in Dr. Gillermina Hu office for signature.

## 2019-07-14 NOTE — Telephone Encounter (Signed)
Form has been signed and faxed to insurance prior auth department.

## 2019-07-15 NOTE — Telephone Encounter (Signed)
Appeal to be completed and faxed to insurance. We will have to wait on Dr. Ernst Bowler to return to office.

## 2019-07-16 ENCOUNTER — Telehealth: Payer: Self-pay

## 2019-07-16 NOTE — Telephone Encounter (Signed)
Patient called.  He is scheduled to start Nucala in the [facility] on Monday, 07/20/2019 at 3:30pm. [name] is very concerned about having a reaction when he starts Nucala.   He is scheduled to see Dr. Ernst Bowler before the injection at 3:00pm.  I informed patient Dr. Ernst Bowler is out this week.  He would like someone to call him to get instructions on how to pre-medicate (if needed) before his first injection of Nucala. Patient states he had a severe reaction to [place] about 2 years ago.  Reaction happened about 9 hours after the injection.   Patient would like a call today if possible.  He knows Dr. Gillermina Hu nurse practitioner will be calling.

## 2019-07-16 NOTE — Telephone Encounter (Signed)
LMOM. I will call him in the morning if he does not call back today. Thank you

## 2019-07-16 NOTE — Telephone Encounter (Signed)
THAT is great news. Thanks! Do I have to sign something?  [name], MD Allergy and [place] [facility] [place]

## 2019-07-16 NOTE — Telephone Encounter (Signed)
[name] was approved through patient's insurance from 07/05/19 to 07/04/2020. [name]'s pharmacy called and stated they gave him a coupon since they were able to get it approve through his [employer] and applied a coupon for 300 off.

## 2019-07-17 ENCOUNTER — Ambulatory Visit: Payer: Self-pay

## 2019-07-17 MED ORDER — CETIRIZINE HCL 10 MG PO TABS: 10.0000 mg | ORAL_TABLET | Freq: Every day | ORAL | 0 refills | Status: DC

## 2019-07-17 NOTE — Telephone Encounter (Signed)
No sir

## 2019-07-17 NOTE — Telephone Encounter (Signed)
Patient worried about getting the Nucala injection and wants to pre-medicate. I will call in cetirizine to his pharmacy. He will call back with any further questions.

## 2019-07-20 ENCOUNTER — Ambulatory Visit: Payer: Self-pay

## 2019-07-20 ENCOUNTER — Other Ambulatory Visit: Payer: Self-pay

## 2019-07-20 ENCOUNTER — Encounter: Payer: Self-pay | Admitting: Allergy & Immunology

## 2019-07-20 ENCOUNTER — Ambulatory Visit (INDEPENDENT_AMBULATORY_CARE_PROVIDER_SITE_OTHER): Payer: Medicare Other | Admitting: Allergy & Immunology

## 2019-07-20 VITALS — BP 126/78 | HR 60 | Temp 98.7°F | Resp 16

## 2019-07-20 DIAGNOSIS — K219 Gastro-esophageal reflux disease without esophagitis: Secondary | ICD-10-CM

## 2019-07-20 DIAGNOSIS — T63441D Toxic effect of venom of bees, accidental (unintentional), subsequent encounter: Secondary | ICD-10-CM | POA: Diagnosis not present

## 2019-07-20 DIAGNOSIS — J4550 Severe persistent asthma, uncomplicated: Secondary | ICD-10-CM

## 2019-07-20 DIAGNOSIS — J449 Chronic obstructive pulmonary disease, unspecified: Secondary | ICD-10-CM | POA: Diagnosis not present

## 2019-07-20 DIAGNOSIS — J302 Other seasonal allergic rhinitis: Secondary | ICD-10-CM

## 2019-07-20 DIAGNOSIS — J3089 Other allergic rhinitis: Secondary | ICD-10-CM

## 2019-07-20 MED ORDER — ALBUTEROL SULFATE HFA 108 (90 BASE) MCG/ACT IN AERS: 2.0000 | INHALATION_SPRAY | RESPIRATORY_TRACT | 1 refills | Status: DC | PRN

## 2019-07-20 NOTE — Progress Notes (Signed)
FOLLOW UP  Date of Service/Encounter:  07/20/19   Assessment:   Severe persistent asthmawith COPD overlap  Chronic prednisone use (5+ years) - currently on 10mg  daily(with intermittent self-induced bursts up to 20mg  daily)  Seasonal and perennial allergic rhinitis(grasses, weeds, trees, cat, dog, dust mite, and cockroach)  History of non-compliance  Pulmonary nodules (stable per last chest CT June 2019)  Complex medical history, including bipolar depression  GERD - not on a controller medication at this time  OSA - with history of non-compliance with CPAP    [name] presents for a follow-up visit.  He also wanted to be sure to get his mepolizumab in a safe environment.  He tolerated this well.  We did monitor him for 1 hour.  He is anxious to get off of some other asthma medications, but I did emphasize that we needed to be on the mepolizumab for a few months before we consider stopping medications.  Obviously our first choice to decrease his the prednisone, given the systemic side effects.  Once we start weaning the prednisone, we may need to consider endocrinology involvement to help with the weaning process.    Plan/Recommendations:   1. Severe persistent asthma with COPD overlap - [name] deferred today.  - You received a sample of Nucala today. - Make an appointment in one month for another injection.  - Daily controller medication(s): Pulmicort 0.5mg  (inhaled steroid) TWO TIMES DAILY + Brovana 39mcg TWO TIMES DAILY + Yupelri ONE TIME DAILY + montelukast 10 mg ONE TIME DAILY + prednisone 5mg  TWO TIMES DAILY + Nucala MONTHLY  - Prior to physical activity: ProAir 2 puffs 10-15 minutes before physical activity. - Rescue medications: albuterol nebulizer one vial every 4-6 hours as needed or DuoNeb nebulizer one vial every 4-6 hours as needed - Asthma control goals:  * Full participation in all desired activities (may need albuterol before activity) * Albuterol  use two time or less a week on average (not counting use with activity) * Cough interfering with sleep two time or less a month * Oral steroids no more than once a year * No hospitalizations  2. Perennial and seasonal allergic rhinitis - Continue with loratadine 10mg  daily as needed. - Continue with fluticasone 1-2 sprays per nostril daily as needed.   3. GERD - Continue omeprazole 40mg  daily to control your reflux.   4. Return in about 4 months (around 11/20/2019). This can be an in-person, a virtual Webex or a telephone follow up visit.  Subjective:   [name] is a 68 y.o. male presenting today for follow up of  Chief Complaint  Patient presents with  . Asthma    [name] has a history of the following: Patient Active Problem List   Diagnosis Date Noted  . COPD with acute exacerbation ([place]) 12/04/2018  . PSVT (paroxysmal supraventricular tachycardia) ([place]) 09/23/2018  . Seasonal and perennial allergic rhinitis 07/30/2018  . COPD with asthma ([place]) 07/07/2018  . S/P arthroscopy of right shoulder 04/25/2017  . Generalized anxiety disorder 04/22/2017  . Severe persistent asthma without complication 99991111  . Bee sting-induced anaphylaxis 02/12/2017  . Anaphylactic reaction due to food, initial encounter 02/12/2017  . Current use of beta blocker 02/12/2017  . Allergic rhinitis 01/15/2017  . COPD exacerbation ([place]) 11/04/2016  . Asthma exacerbation 11/03/2016  . Nausea without vomiting 09/17/2016  . Shortness of breath   . Diabetes mellitus type 2 in obese ([place]) 06/26/2016  . non-specific Chest pain 06/12/2016  .  Patient's noncompliance with other medical treatment and regimen 06/02/2016  . Hyperlipidemia, mixed 06/02/2016  . Elevated liver enzymes  06/02/2016  . Schizophrenia ([place]) 04/30/2016  . Environmental and seasonal allergies 04/30/2016  . Diabetes mellitus without complication- (diet controlled; w/o proteinuria) 04/30/2016  . Heavy alcohol use-  Many yrs  04/30/2016  . Basal cell carcinoma of back 04/30/2016  . Obesity 04/30/2016  . Hx of multiple concussions 04/30/2016  . h/o Vitamin D deficiency 04/30/2016  . Screen for sexually transmitted diseases 04/30/2016  . Immunocompromised state ([place]) 04/30/2016  . Cervical radiculopathy 01/15/2016  . Alcohol withdrawal ([place]) 11/22/2015  . Alcohol abuse with alcohol-induced mood disorder ([place]) 11/22/2015  . h/o Suicidal thoughts 11/21/2015  . Dry mouth 10/07/2015  . Anxiety 09/19/2015  . OAB (overactive bladder) 07/27/2015  . Chronic idiopathic constipation 05/08/2015  . Screening for colon cancer   . Benign neoplasm of cecum   . Benign neoplasm of ascending colon   . Benign neoplasm of transverse colon   . Benign neoplasm of sigmoid colon   . Rectal polyp   . Gastroesophageal reflux disease without esophagitis   . Esophageal stricture   . Joint stiffness of multiple sites 10/17/2014  . Benign localized hyperplasia of prostate with urinary obstruction 09/29/2014  . concerns for memory loss 09/13/2014  . Tuberculosis   . Diverticulum of bladder 06/17/2014  . Acute urinary retention 05/31/2014  . Neuropathy ([place]) 04/11/2014  . Post-polio syndrome 01/13/2014  . Other emphysema ([place]) 01/13/2014  . Hypertension 01/13/2014  . Former smoker 01/13/2014  . h/o Prostate cancer 01/13/2014  . History of tuberculosis 01/13/2014  . Bipolar disorder ([place]) 01/13/2014  . Explosive personality disorder ([place]) 01/13/2014  . Erectile dysfunction 01/13/2014  . Nephrolithiasis 01/13/2014  . Chronic pain 01/13/2014  . Obstructive sleep apnea 01/13/2014  . Alcoholic gastritis 99991111  . Nocturnal hypoxia 08/10/2013    History obtained from: chart review and patient and his wife.   [name] is a 68 y.o. male presenting for a follow up visit.  He has a history of noncompliance.  He was last seen in August 2020 after a hiatus of nearly 6 months.  We got him back on his medication regimen.  In  the past, nebulized medications have seemed to have worked well for him.  However, try to get these approved through his Medicare and secondary insurance has been horrendous to say the least.  We have finally been able to get the Pulmicort twice daily approved, Brovana twice daily approved, and the Yupelri once daily approved.  We also continue the montelukast 10 mg daily and his daily prednisone, which adds up to 10 mg in total each day.  For his rhinitis, we continued Claritin and Flonase.  We also continued omeprazole 40 mg daily and referred him again to gastroenterology due to a history of esophageal strictures and balloon dilations.  Since last visit, he has done well.  He is thankful to finally have all of his medications approved.  Asthma/Respiratory Symptom History: He remains on the Pulmicort and Brovana twice daily.  He also uses the [place].  He is able to get all of these medications for around $16 or so per month.  He is very thankful for this zone,  He remains on the prednisone.  He is concerned with his weight gain on the prednisone, but also notes that he drinks several beers per day.  He has not needed to increase his prednisone for breathing purposes.  Apparently, his house uses  wood for heating, and the fumes do bother his asthma.  They are considering purchasing a different house.  Currently he is just renting.  Allergic Rhinitis Symptom History: He remains on the Claritin and the fluticasone.  He has not needed antibiotics since last visit.  Otherwise, there have been no changes to his past medical history, surgical history, family history, or social history.    Review of Systems  Constitutional: Negative.  Negative for chills, fever, malaise/fatigue and weight loss.  HENT: Negative.  Negative for congestion, ear discharge, ear pain, sinus pain and sore throat.   Eyes: Negative for pain, discharge and redness.  Respiratory: Negative for cough, sputum production, shortness of  breath and wheezing.   Cardiovascular: Negative.  Negative for chest pain and palpitations.  Gastrointestinal: Negative for abdominal pain, constipation, diarrhea, heartburn, nausea and vomiting.  Skin: Negative.  Negative for itching and rash.  Neurological: Negative for dizziness and headaches.  Endo/Heme/Allergies: Negative for environmental allergies. Does not bruise/bleed easily.       Objective:   Blood pressure 126/78, pulse 60, temperature 98.7 F (37.1 C), temperature source Temporal, resp. rate 16, SpO2 95 %. There is no height or weight on file to calculate BMI.   Physical Exam:  Physical Exam  Constitutional: He appears well-developed.  Boisterous as always.  HENT:  Head: Normocephalic and atraumatic.  Right Ear: Tympanic membrane, external ear and ear canal normal.  Left Ear: Tympanic membrane, external ear and ear canal normal.  Nose: Mucosal edema and rhinorrhea present. No nasal deformity or septal deviation. No epistaxis. Right sinus exhibits no maxillary sinus tenderness and no frontal sinus tenderness. Left sinus exhibits no maxillary sinus tenderness and no frontal sinus tenderness.  Mouth/Throat: Uvula is midline and oropharynx is clear and moist. Mucous membranes are not pale and not dry.  Cobblestoning present in the posterior oropharynx.  Eyes: Pupils are equal, round, and reactive to light. Conjunctivae and EOM are normal. Right eye exhibits no chemosis and no discharge. Left eye exhibits no chemosis and no discharge. Right conjunctiva is not injected. Left conjunctiva is not injected.  Cardiovascular: Normal rate, regular rhythm and normal heart sounds.  Respiratory: Effort normal and breath sounds normal. No accessory muscle usage. No tachypnea. No respiratory distress. He has no wheezes. He has no rhonchi. He has no rales. He exhibits no tenderness.  Moving air well at the bases, which is rare for him.  No crackles noted.  He does have scattered expiratory  wheezes, which is his baseline.   Lymphadenopathy:    He has no cervical adenopathy.  Neurological: He is alert.  Skin: No abrasion, no petechiae and no rash noted. Rash is not papular, not vesicular and not urticarial. No erythema. No pallor.  No eczematous or urticarial lesions noted.   Psychiatric: He has a normal mood and affect.     Diagnostic studies: none    [name], MD  [facility] [place] [facility] [place]

## 2019-07-20 NOTE — Patient Instructions (Addendum)
1. Severe persistent asthma with COPD overlap - [name] deferred today.  - You received a sample of Nucala today. - Make an appointment in one month for another injection.  - Daily controller medication(s): Pulmicort 0.5mg  (inhaled steroid) TWO TIMES DAILY + Brovana 23mcg TWO TIMES DAILY + Yupelri ONE TIME DAILY + montelukast 10 mg ONE TIME DAILY + prednisone 5mg  TWO TIMES DAILY + Nucala MONTHLY  - Prior to physical activity: ProAir 2 puffs 10-15 minutes before physical activity. - Rescue medications: albuterol nebulizer one vial every 4-6 hours as needed or DuoNeb nebulizer one vial every 4-6 hours as needed - Asthma control goals:  * Full participation in all desired activities (may need albuterol before activity) * Albuterol use two time or less a week on average (not counting use with activity) * Cough interfering with sleep two time or less a month * Oral steroids no more than once a year * No hospitalizations  2. Perennial and seasonal allergic rhinitis - Continue with loratadine 10mg  daily as needed. - Continue with fluticasone 1-2 sprays per nostril daily as needed.   3. GERD - Continue omeprazole 40mg  daily to control your reflux.   4. Return in about 4 months (around 11/20/2019). This can be an in-person, a virtual Webex or a telephone follow up visit.   Please inform us of any Emergency Department visits, hospitalizations, or changes in symptoms. Call us before going to the ED for breathing or allergy symptoms since we might be able to fit you in for a sick visit. Feel free to contact us anytime with any questions, problems, or concerns.  It was a pleasure to see you and your wife again today!   Websites that have reliable patient information: 1. American Academy of Asthma, Allergy, and Immunology: www.aaaai.org 2. Food Allergy Research and Education (FARE): foodallergy.org 3. Mothers of Asthmatics: http://www.asthmacommunitynetwork.org 4. American College of Allergy, Asthma,  and Immunology: www.acaai.org  "Like" [name] on Facebook and Instagram for our latest updates!      Make sure you are registered to vote! If you have moved or changed any of your contact information, you will need to get this updated before voting!  In some cases, you MAY be able to register to vote online: [email or web address]    Voter ID laws are NOT going into effect for the General Election in November 2020! DO NOT let this stop you from exercising your right to vote!   Absentee voting is the SAFEST way to vote during the coronavirus pandemic!   Download and print an absentee ballot request form at [email or web address]-Request or you can scan the QR code below with your smart phone:      More information on absentee ballots can be found here: [email or web address]

## 2019-07-21 ENCOUNTER — Telehealth: Payer: Self-pay | Admitting: Allergy

## 2019-07-21 ENCOUNTER — Other Ambulatory Visit: Payer: Self-pay | Admitting: *Deleted

## 2019-07-21 ENCOUNTER — Other Ambulatory Visit: Payer: Self-pay

## 2019-07-21 ENCOUNTER — Encounter: Payer: Self-pay | Admitting: Allergy & Immunology

## 2019-07-21 MED ORDER — BUDESONIDE 0.5 MG/2ML IN SUSP: 0.5000 mg | Freq: Two times a day (BID) | RESPIRATORY_TRACT | 1 refills | Status: DC

## 2019-07-21 MED ORDER — BROVANA 15 MCG/2ML IN NEBU: 15.0000 ug | INHALATION_SOLUTION | Freq: Two times a day (BID) | RESPIRATORY_TRACT | 1 refills | Status: DC

## 2019-07-21 NOTE — Telephone Encounter (Signed)
PLEASE call patient this morning. See if he can come in. Ernst Bowler has opening this morning at 9:30AM at [facility] office. Or Ann has opening at [facility] office.   Patient called this morning stating that he had his first Nucala injection yesterday. He felt fine until that night around 6-8PM. He felt very exhausted so he went to bed. Denies having any rashes or itching. Woke up at 4AM to use the bathroom and noticed that his mouth felt extremely dry and states that "I make no spit". He also feels tired and he can't breathe as well. He did not take any benadryl last night or antihistamines. He has not taken any albuterol and he does not know where it is. He is asking if he can take his Brovana and Pulmicort this morning.  I advised him to use an albuterol nebulizer or HFA to open his lungs and then administer his maintenance inhalers but he doesn't know if he can find his albuterol.   He is speaking in full sentences. No audible wheezing or coughing during the phone call.   He is asking for a phone call back when the office opens.   He also states that about 1 year ago he had Berna Bue and broke out in whole body rash and needed benadryl to treat this.

## 2019-07-21 NOTE — Telephone Encounter (Signed)
Dr. Ernst Bowler do you have any further recommendations?

## 2019-07-21 NOTE — Telephone Encounter (Signed)
I actually routed a note to [employer] to call him and check on him today. I hope this is nothing, but we will see. I think my 9:30 was already taken with a [facility] patient.   [name], MD [facility] [place] [facility] [place]

## 2019-07-21 NOTE — Telephone Encounter (Signed)
Let us have him double up on his antihistamines for a few days.  We will call him at the end of the week to see how he is doing.  [name], MD [facility] [place] [facility] [place]

## 2019-07-21 NOTE — Telephone Encounter (Signed)
I spoke with [name] this morning. He says that he was feeling better but began feeling nauseous. He noticed some weakness when trying to get up. He had also developed tingling in his hands but not this is just in his right hand. Patient does complain of occasional chest tightness. He does not want to come in as he does not feel safe driving at this time. He did do as instructed by Dr. Maudie Mercury this morning.

## 2019-07-21 NOTE — Progress Notes (Signed)
Fax from [facility], for a 90 day supply

## 2019-07-21 NOTE — Progress Notes (Signed)
Note made on telephone encounter for Dr. Maudie Mercury.

## 2019-07-21 NOTE — Telephone Encounter (Signed)
Patient informed of instructions and verbalized understanding.

## 2019-07-22 ENCOUNTER — Ambulatory Visit: Payer: Medicare Other | Admitting: Allergy

## 2019-07-22 ENCOUNTER — Other Ambulatory Visit: Payer: Self-pay

## 2019-07-22 ENCOUNTER — Telehealth: Payer: Self-pay | Admitting: *Deleted

## 2019-07-22 ENCOUNTER — Encounter: Payer: Self-pay | Admitting: Allergy

## 2019-07-22 VITALS — BP 146/82 | HR 100 | Resp 20

## 2019-07-22 DIAGNOSIS — J302 Other seasonal allergic rhinitis: Secondary | ICD-10-CM

## 2019-07-22 DIAGNOSIS — K219 Gastro-esophageal reflux disease without esophagitis: Secondary | ICD-10-CM | POA: Diagnosis not present

## 2019-07-22 DIAGNOSIS — J449 Chronic obstructive pulmonary disease, unspecified: Secondary | ICD-10-CM | POA: Diagnosis not present

## 2019-07-22 DIAGNOSIS — T63441D Toxic effect of venom of bees, accidental (unintentional), subsequent encounter: Secondary | ICD-10-CM | POA: Diagnosis not present

## 2019-07-22 DIAGNOSIS — J3089 Other allergic rhinitis: Secondary | ICD-10-CM | POA: Diagnosis not present

## 2019-07-22 MED ORDER — LEVALBUTEROL HCL 1.25 MG/3ML IN NEBU: INHALATION_SOLUTION | RESPIRATORY_TRACT | 2 refills | Status: DC

## 2019-07-22 MED ORDER — BROVANA 15 MCG/2ML IN NEBU: 15.0000 ug | INHALATION_SOLUTION | Freq: Two times a day (BID) | RESPIRATORY_TRACT | 1 refills | Status: DC

## 2019-07-22 MED ORDER — BUDESONIDE 0.5 MG/2ML IN SUSP: 0.5000 mg | Freq: Two times a day (BID) | RESPIRATORY_TRACT | 1 refills | Status: DC

## 2019-07-22 MED ORDER — PREDNISONE 20 MG PO TABS: ORAL_TABLET | ORAL | 0 refills | Status: AC

## 2019-07-22 NOTE — Assessment & Plan Note (Signed)
   Continue with loratadine 10mg  daily as needed.  Continue with fluticasone 1-2 sprays per nostril daily as needed.

## 2019-07-22 NOTE — Assessment & Plan Note (Signed)
   Patient prefers getting hymenoptera panel bloodwork drawn at another time.   For mild symptoms you can take over the counter antihistamines such as Benadryl and monitor symptoms closely. If symptoms worsen or if you have severe symptoms including breathing issues, throat closure, significant swelling, whole body hives, severe diarrhea and vomiting, lightheadedness then inject epinephrine and seek immediate medical care afterwards.

## 2019-07-22 NOTE — Assessment & Plan Note (Addendum)
Worsening breathing symptoms with wheezing and chest tightness the past few days. Concerned about Nucala injection and would like to stop. . Declines spirometry.  . Slightly improved symptoms after xopenex treatment.  . Start prednisone taper, 60mg  x 2 days, 40mg  daily x 3 days, 20mg  daily x 3 days then resume your normal prednisone 5mg  twice a day. o Monitor sugars.  . Use xopenex nebulizer twice a day before all your inhalers for the next 5 days. o This replaces your albuterol for now due to palpitations.  . If you are not feeling better even after using your xopenex nebulizer then go to ER/urgent care.  . Stop Nucala for now.  . Daily controller medication(s): Pulmicort 0.5mg  (inhaled steroid) TWO TIMES DAILY + Brovana 74mcg TWO TIMES DAILY + Yupelri ONE TIME DAILY + montelukast 10 mg ONE TIME DAILY + prednisone 5mg  TWO TIMES DAILY . Prior to physical activity: May use albuterol rescue inhaler 2 puffs 5 to 15 minutes prior to strenuous physical activities. Marland Kitchen Rescue medications: May use albuterol rescue inhaler 2 puffs or nebulizer every 4 to 6 hours as needed for shortness of breath, chest tightness, coughing, and wheezing. Monitor frequency of use. . Use oxygen at night 2L as before for COPD.

## 2019-07-22 NOTE — Telephone Encounter (Signed)
Patient calls stating that he believes he had an allergic reaction to his Nucala injection. He received his first injection on 07/20/19 in the [facility] office. Patient stated that later that evening he went to the bathroom and felt "drunk" and as if he were bouncing off the walls. He stated that yesterday he has been having issues with numbness in his upper torso and extremities and has been having chest pain. He states that he went to bed with oxygen last night because he has been having issues with coughing and SOBr. He states that he hs not been breathing well. Patient has been placed on the schedule to be seen by Dr. Maudie Mercury today in the [facility] office.

## 2019-07-22 NOTE — Assessment & Plan Note (Signed)
   Continue omeprazole 40mg  daily to control your reflux.

## 2019-07-22 NOTE — Addendum Note (Signed)
Addended by: Lucrezia Starch I on: 07/22/2019 08:26 AM   Modules accepted: Orders

## 2019-07-22 NOTE — Progress Notes (Signed)
Follow Up Note  RE: [name] MRN: [id number] DOB: Jun 25, 1951 Date of Office Visit: 07/22/2019  Referring provider: [name], FNP Primary care provider: [name], FNP  Chief Complaint: Shortness of Breath and Cough  History of Present Illness: I had the pleasure of seeing [name] for a follow up visit at the [facility] on 07/22/2019. He is a 68 y.o. male, who is being followed for asthma with COPD, allergic rhinitis and GERD. Today he is here for new complaint of possible reaction to Nucala. His previous allergy office visit was on 07/20/2019 with Dr. Ernst Bowler.   1. Severe persistent asthma with COPD overlap Patient woke up around 3-4AM because he couldn't breathe on 10/6. He sleeps with 2L of oxygen at night.   Patient felt better after using his inhalers and albuterol.  He has an irregular heartbeat and he thought he had some palpitations later that day.   He has been having decreased appetite.  Last night he went to bed around 9PM and around 3AM he felt like he was drunk and felt that his chest was tight. Now he is wheezing.   Patient had hives after Fasenra injection about 1 year ago and really concerned about the Nucala injection he received.   He has been on prednisone 5mg  BID. He is feeling better today but still wheezing and has increased phlegm. Denies any fevers or chills, sick contacts.  He has been taking all his medications:  Pulmicort 0.5mg  (inhaled steroid) TWO TIMES DAILY + Brovana 65mcg TWO TIMES DAILY + Yupelri ONE TIME DAILY + montelukast 10 mg ONE TIME DAILY + prednisone 5mg  TWO TIMES DAILY.  He also used albuterol nebulizer this morning which helped.  Assessment and Plan: [name] is a 68 y.o. male with: Asthma-COPD overlap syndrome (HCC) Worsening breathing symptoms with wheezing and chest tightness the past few days. Concerned about Nucala injection and would like to stop.  Declines spirometry.    Slightly improved symptoms after xopenex treatment.   Start prednisone taper, 60mg  x 2 days, 40mg  daily x 3 days, 20mg  daily x 3 days then resume your normal prednisone 5mg  twice a day. o Monitor sugars.   Use xopenex nebulizer twice a day before all your inhalers for the next 5 days. o This replaces your albuterol for now due to palpitations.   If you are not feeling better even after using your xopenex nebulizer then go to ER/urgent care.   Stop Nucala for now.   Daily controller medication(s): Pulmicort 0.5mg  (inhaled steroid) TWO TIMES DAILY + Brovana 39mcg TWO TIMES DAILY + Yupelri ONE TIME DAILY + montelukast 10 mg ONE TIME DAILY + prednisone 5mg  TWO TIMES DAILY  Prior to physical activity: May use albuterol rescue inhaler 2 puffs 5 to 15 minutes prior to strenuous physical activities.  Rescue medications: May use albuterol rescue inhaler 2 puffs or nebulizer every 4 to 6 hours as needed for shortness of breath, chest tightness, coughing, and wheezing. Monitor frequency of use.  Use oxygen at night 2L as before for COPD.   Seasonal and perennial allergic rhinitis  Continue with loratadine 10mg  daily as needed.  Continue with fluticasone 1-2 sprays per nostril daily as needed.  Gastroesophageal reflux disease without esophagitis  Continue omeprazole 40mg  daily to control your reflux.   Bee sting-induced anaphylaxis  Patient prefers getting hymenoptera panel bloodwork drawn at another time.   For mild symptoms you can take over the counter antihistamines such  as Benadryl and monitor symptoms closely. If symptoms worsen or if you have severe symptoms including breathing issues, throat closure, significant swelling, whole body hives, severe diarrhea and vomiting, lightheadedness then inject epinephrine and seek immediate medical care afterwards.  Return in about 4 weeks (around 08/19/2019).  Meds ordered this encounter  Medications   levalbuterol (XOPENEX) 1.25 MG/3ML  nebulizer solution    Sig: 1.25 mg via nebulizer every 4-6 hours as needed.    Dispense:  72 mL    Refill:  2    J44.9   predniSONE (DELTASONE) 20 MG tablet    Sig: Take 3 tablets (60 mg total) by mouth daily for 2 days, THEN 2 tablets (40 mg total) daily for 3 days, THEN 1 tablet (20 mg total) daily for 3 days.    Dispense:  15 tablet    Refill:  0   Diagnostics: Spirometry:  Declines.  Medication List:  Current Outpatient Medications  Medication Sig Dispense Refill   albuterol (PROVENTIL) (2.5 MG/3ML) 0.083% nebulizer solution Take 2.5 mg by nebulization every 6 (six) hours as needed for wheezing or shortness of breath.     albuterol (VENTOLIN HFA) 108 (90 Base) MCG/ACT inhaler Inhale 2 puffs into the lungs every 6 (six) hours as needed for wheezing or shortness of breath.     arformoterol (BROVANA) 15 MCG/2ML NEBU Take 2 mLs (15 mcg total) by nebulization 2 (two) times daily. 360 mL 1   bisoprolol (ZEBETA) 5 MG tablet Take 1 tablet (5 mg total) by mouth daily. 90 tablet 1   budesonide (PULMICORT) 0.5 MG/2ML nebulizer solution Take 2 mLs (0.5 mg total) by nebulization 2 (two) times daily. 360 mL 1   cetirizine (ZYRTEC) 10 MG tablet Take 1 tablet (10 mg total) by mouth daily. Take 1 tablet 2 hours before you receive the Nucala injection 30 tablet 0   EPINEPHrine (EPIPEN 2-PAK) 0.3 mg/0.3 mL IJ SOAJ injection Inject 0.3 mLs (0.3 mg total) into the muscle as needed for anaphylaxis. 1 each 2   esomeprazole (NEXIUM) 40 MG capsule Take 40 mg by mouth daily at 12 noon.     loratadine (CLARITIN) 10 MG tablet Take 1 tablet (10 mg total) by mouth daily as needed for allergies. 30 tablet 5   montelukast (SINGULAIR) 10 MG tablet Take 1 tablet (10 mg total) by mouth at bedtime. 30 tablet 5   nitroGLYCERIN (NITROSTAT) 0.4 MG SL tablet Place 1 tablet (0.4 mg total) under the tongue every 5 (five) minutes as needed for chest pain. 30 tablet 3   NUCALA 100 MG SOLR      omeprazole  (PRILOSEC) 40 MG capsule Take 1 capsule (40 mg total) by mouth daily. 30 capsule 5   OXYGEN Inhale 2 L into the lungs at bedtime. DURING THE DAY IF NEEDED Reported on 11/25/2015     predniSONE (DELTASONE) 5 MG tablet Take 5 mg by mouth 2 (two) times daily.     Revefenacin (YUPELRI) 175 MCG/3ML SOLN Inhale 1 vial into the lungs daily. 270 mL 1   levalbuterol (XOPENEX) 1.25 MG/3ML nebulizer solution 1.25 mg via nebulizer every 4-6 hours as needed. 72 mL 2   predniSONE (DELTASONE) 20 MG tablet Take 3 tablets (60 mg total) by mouth daily for 2 days, THEN 2 tablets (40 mg total) daily for 3 days, THEN 1 tablet (20 mg total) daily for 3 days. 15 tablet 0   No current facility-administered medications for this visit.    Allergies: Allergies  Allergen Reactions  Aspirin Anaphylaxis   Hornet Venom Anaphylaxis   Ivp Dye [Iodinated Diagnostic Agents] Anaphylaxis   Levaquin [Levofloxacin In D5w] Shortness Of Breath and Swelling    In addition: sweating, chest pain, and diarrhea.    Nsaids Anaphylaxis   Penicillins Anaphylaxis    Heart stops Has patient had a PCN reaction causing immediate rash, facial/tongue/throat swelling, SOB or lightheadedness with hypotension: yes Has patient had a PCN reaction causing severe rash involving mucus membranes or skin necrosis: NO Has patient had a PCN reaction that required hospitalization yes Has patient had a PCN reaction occurring within the last 10 years: No If all of the above answers are "NO", then may proceed with Cephalosporin use.    Tolmetin Anaphylaxis   Fasenra [Benralizumab] Hives   Perforomist [Formoterol]     Increased wheezing, shortness of breath   Buprenorphine Hcl Nausea And Vomiting    Can take with zofran    Morphine And Related Nausea And Vomiting    Can take with zofran    Oxycodone Itching and Rash   I reviewed his past medical history, social history, family history, and environmental history and no significant  changes have been reported from his previous visit.  Review of Systems  Constitutional: Negative for appetite change, chills, fever and unexpected weight change.  HENT: Negative for congestion and rhinorrhea.   Eyes: Negative for itching.  Respiratory: Positive for cough, chest tightness, shortness of breath and wheezing.   Gastrointestinal: Negative for abdominal pain.  Skin: Negative for rash.  Neurological: Negative for headaches.   Objective: BP (!) 146/82 (BP Location: Right Arm, Patient Position: Sitting, Cuff Size: Normal)    Pulse 100    Resp 20    SpO2 98%  There is no height or weight on file to calculate BMI. Physical Exam  Constitutional: He is oriented to person, place, and time. He appears well-developed and well-nourished.  HENT:  Head: Normocephalic and atraumatic.  Right Ear: External ear normal.  Left Ear: External ear normal.  Nose: Nose normal.  Mouth/Throat: Oropharynx is clear and moist.  Eyes: Conjunctivae and EOM are normal.  Neck: Neck supple.  Cardiovascular: Normal rate, regular rhythm and normal heart sounds. Exam reveals no gallop and no friction rub.  No murmur heard. Pulmonary/Chest: Effort normal. He has wheezes. He has no rales.  Neurological: He is alert and oriented to person, place, and time.  Skin: Skin is warm. No rash noted.  Psychiatric: He has a normal mood and affect. His behavior is normal.  Nursing note and vitals reviewed.  Previous notes and tests were reviewed. The plan was reviewed with the patient/family, and all questions/concerned were addressed.  It was my pleasure to see [name] today and participate in his care. Please feel free to contact me with any questions or concerns.  Sincerely,  Rexene Alberts, DO Allergy & Immunology  [facility] office: [phone] [facility] office: [place] office: [phone]

## 2019-07-22 NOTE — Patient Instructions (Addendum)
Asthma with COPD: . Start prednisone taper, 60mg  x 2 days, 40mg  daily x 3 days, 20mg  daily x 3 days then resume your normal prednisone 5mg  twice a day. . Use xopenex nebulizer twice a day before all your inhalers for the next 5 days. o This replaces your albuterol for now.  . If you are not feeling better even after using your xopenex nebulizer then go to ER/urgent care.  . Stop Nucala. . Daily controller medication(s): Pulmicort 0.5mg  (inhaled steroid) TWO TIMES DAILY + Brovana 29mcg TWO TIMES DAILY + Yupelri ONE TIME DAILY + montelukast 10 mg ONE TIME DAILY + prednisone 5mg  TWO TIMES DAILY . Prior to physical activity: May use albuterol rescue inhaler 2 puffs 5 to 15 minutes prior to strenuous physical activities. Marland Kitchen Rescue medications: May use albuterol rescue inhaler 2 puffs or nebulizer every 4 to 6 hours as needed for shortness of breath, chest tightness, coughing, and wheezing. Monitor frequency of use.  . Asthma control goals:  o Full participation in all desired activities (may need albuterol before activity) o Albuterol use two times or less a week on average (not counting use with activity) o Cough interfering with sleep two times or less a month o Oral steroids no more than once a year o No hospitalizations  Allergic rhinitis:  Continue with loratadine 10mg  daily as needed.  Continue with fluticasone 1-2 sprays per nostril daily as needed.  GERD  Continue omeprazole 40mg  daily to control your reflux.   Follow up in 1 month with Dr. Ernst Bowler or sooner if needed.

## 2019-07-22 NOTE — Telephone Encounter (Signed)
I talked to him earlier this afternoon. I am unsure that this is related to the Nucala, but all of his symptoms did indeed start after the injection.   [name], MD [facility]

## 2019-07-22 NOTE — Telephone Encounter (Signed)
Patient is being seen in [facility] clinic 07-22-2019.

## 2019-07-23 ENCOUNTER — Emergency Department (HOSPITAL_COMMUNITY): Payer: Medicare Other

## 2019-07-23 ENCOUNTER — Telehealth: Payer: Self-pay | Admitting: Allergy

## 2019-07-23 ENCOUNTER — Emergency Department (HOSPITAL_COMMUNITY)
Admission: EM | Admit: 2019-07-23 | Discharge: 2019-07-23 | Disposition: A | Discharge status: Home / Self Care | Payer: Medicare Other | Attending: Emergency Medicine | Admitting: Emergency Medicine

## 2019-07-23 ENCOUNTER — Other Ambulatory Visit: Payer: Self-pay

## 2019-07-23 ENCOUNTER — Encounter (HOSPITAL_COMMUNITY): Payer: Self-pay | Admitting: Emergency Medicine

## 2019-07-23 DIAGNOSIS — D120 Benign neoplasm of cecum: Secondary | ICD-10-CM | POA: Insufficient documentation

## 2019-07-23 DIAGNOSIS — E119 Type 2 diabetes mellitus without complications: Secondary | ICD-10-CM | POA: Diagnosis not present

## 2019-07-23 DIAGNOSIS — Z85820 Personal history of malignant melanoma of skin: Secondary | ICD-10-CM | POA: Diagnosis not present

## 2019-07-23 DIAGNOSIS — I10 Essential (primary) hypertension: Secondary | ICD-10-CM | POA: Insufficient documentation

## 2019-07-23 DIAGNOSIS — Z8546 Personal history of malignant neoplasm of prostate: Secondary | ICD-10-CM | POA: Insufficient documentation

## 2019-07-23 DIAGNOSIS — Z87891 Personal history of nicotine dependence: Secondary | ICD-10-CM | POA: Diagnosis not present

## 2019-07-23 DIAGNOSIS — Z20828 Contact with and (suspected) exposure to other viral communicable diseases: Secondary | ICD-10-CM | POA: Insufficient documentation

## 2019-07-23 DIAGNOSIS — Z85828 Personal history of other malignant neoplasm of skin: Secondary | ICD-10-CM | POA: Insufficient documentation

## 2019-07-23 DIAGNOSIS — D125 Benign neoplasm of sigmoid colon: Secondary | ICD-10-CM | POA: Insufficient documentation

## 2019-07-23 DIAGNOSIS — D122 Benign neoplasm of ascending colon: Secondary | ICD-10-CM | POA: Insufficient documentation

## 2019-07-23 DIAGNOSIS — D123 Benign neoplasm of transverse colon: Secondary | ICD-10-CM | POA: Insufficient documentation

## 2019-07-23 DIAGNOSIS — J4541 Moderate persistent asthma with (acute) exacerbation: Secondary | ICD-10-CM | POA: Diagnosis not present

## 2019-07-23 DIAGNOSIS — Z79899 Other long term (current) drug therapy: Secondary | ICD-10-CM | POA: Diagnosis not present

## 2019-07-23 DIAGNOSIS — R0602 Shortness of breath: Secondary | ICD-10-CM | POA: Diagnosis present

## 2019-07-23 LAB — COMPREHENSIVE METABOLIC PANEL
ALT: 41 U/L (ref 0–44)
AST: 27 U/L (ref 15–41)
Albumin: 4.5 g/dL (ref 3.5–5.0)
Alkaline Phosphatase: 52 U/L (ref 38–126)
Anion gap: 12 (ref 5–15)
BUN: 10 mg/dL (ref 8–23)
CO2: 22 mmol/L (ref 22–32)
Calcium: 9.4 mg/dL (ref 8.9–10.3)
Chloride: 102 mmol/L (ref 98–111)
Creatinine, Ser: 1.18 mg/dL (ref 0.61–1.24)
GFR calc Af Amer: >60 mL/min (ref >60)
GFR calc non Af Amer: >60 mL/min (ref >60)
Glucose, Bld: 209 mg/dL — ABNORMAL HIGH (ref 70–99)
Potassium: 4.6 mmol/L (ref 3.5–5.1)
Sodium: 136 mmol/L (ref 135–145)
Total Bilirubin: 0.8 mg/dL (ref 0.3–1.2)
Total Protein: 7.9 g/dL (ref 6.5–8.1)

## 2019-07-23 LAB — CBC WITH DIFFERENTIAL/PLATELET
Abs Immature Granulocytes: 0.05 10*3/uL (ref 0.00–0.07)
Basophils Absolute: 0 10*3/uL (ref 0.0–0.1)
Basophils Relative: 0 %
Eosinophils Absolute: 0 10*3/uL (ref 0.0–0.5)
Eosinophils Relative: 0 %
HCT: 43.9 % (ref 39.0–52.0)
Hemoglobin: 14.6 g/dL (ref 13.0–17.0)
Immature Granulocytes: 1 %
Lymphocytes Relative: 4 %
Lymphs Abs: 0.3 10*3/uL — ABNORMAL LOW (ref 0.7–4.0)
MCH: 32.5 pg (ref 26.0–34.0)
MCHC: 33.3 g/dL (ref 30.0–36.0)
MCV: 97.8 fL (ref 80.0–100.0)
Monocytes Absolute: 0.1 10*3/uL (ref 0.1–1.0)
Monocytes Relative: 1 %
Neutro Abs: 7.7 10*3/uL (ref 1.7–7.7)
Neutrophils Relative %: 94 %
Platelets: 252 10*3/uL (ref 150–400)
RBC: 4.49 MIL/uL (ref 4.22–5.81)
RDW: 12.9 % (ref 11.5–15.5)
WBC: 8.2 10*3/uL (ref 4.0–10.5)
nRBC: 0 % (ref 0.0–0.2)

## 2019-07-23 LAB — BLOOD GAS, VENOUS
Acid-base deficit: 0.9 mmol/L (ref 0.0–2.0)
Bicarbonate: 24.1 mmol/L (ref 20.0–28.0)
O2 Saturation: 74 %
Patient temperature: 98.6
pCO2, Ven: 42.9 mmHg — ABNORMAL LOW (ref 44.0–60.0)
pH, Ven: 7.367 (ref 7.250–7.430)
pO2, Ven: 40.6 mmHg (ref 32.0–45.0)

## 2019-07-23 LAB — TROPONIN I (HIGH SENSITIVITY): Troponin I (High Sensitivity): 4 ng/L (ref <18)

## 2019-07-23 LAB — SARS CORONAVIRUS 2 BY RT PCR (HOSPITAL ORDER, PERFORMED IN ~~LOC~~ HOSPITAL LAB): SARS Coronavirus 2: NEGATIVE

## 2019-07-23 MED ORDER — ALBUTEROL SULFATE HFA 108 (90 BASE) MCG/ACT IN AERS
2.0000 | INHALATION_SPRAY | Freq: Once | RESPIRATORY_TRACT | Status: AC
  Administered 2019-07-23: 20:00:00 2 via RESPIRATORY_TRACT
  Filled 2019-07-23: qty 6.7

## 2019-07-23 MED ORDER — METHYLPREDNISOLONE SODIUM SUCC 125 MG IJ SOLR
125.0000 mg | Freq: Once | INTRAMUSCULAR | Status: AC
  Administered 2019-07-23: 125 mg via INTRAVENOUS
  Filled 2019-07-23: qty 2

## 2019-07-23 MED ORDER — MAGNESIUM SULFATE 2 GM/50ML IV SOLN
2.0000 g | Freq: Once | INTRAVENOUS | Status: AC
  Administered 2019-07-23: 2 g via INTRAVENOUS
  Filled 2019-07-23: qty 50

## 2019-07-23 NOTE — Telephone Encounter (Signed)
Patient never called back with update from ED. I called home and mobile numbers with no response. Dr. Ernst Bowler also called and left multiple messages for patient or wife to call back.

## 2019-07-23 NOTE — Discharge Instructions (Signed)
Take your prednisone as prescribed   See your asthma doctor   Return to ER if you have worse shortness of breath, wheezing, cough, trouble breathing

## 2019-07-23 NOTE — Telephone Encounter (Signed)
-----   Message from Garnet Sierras, DO sent at 07/22/2019  5:22 PM EDT ----- Please follow up with patient and make sure he is monitoring his sugars with the higher doses of prednisone. Thank you.

## 2019-07-23 NOTE — ED Triage Notes (Addendum)
Patient reports seen at PCP for cough and persistent wheezing yesterday. Hx asthma and COPD. Reports starting prednisone as prescribed and taking all asthma meds. Labored breathing in triage. Audible wheezing.

## 2019-07-23 NOTE — ED Notes (Signed)
Pt was verbalized discharge instructions. Pt had no further questions at this time. NAD. 

## 2019-07-23 NOTE — Progress Notes (Signed)
Patient called to inform me that he was going to [facility] ED because he was unable to breathe. I told him to proceed to the hospital if he felt as if he was unable to breathe and to call me with an update. He verbalized understanding of instructions and will do this.

## 2019-07-23 NOTE — ED Provider Notes (Signed)
[facility] DEPT Provider Note   CSN: [id number] Arrival date & time: 07/23/19  [id number]     History   Chief Complaint Chief Complaint  Patient presents with  . Shortness of Breath  . Cough    HPI [name] is a 68 y.o. male history of moderate to severe asthma, COPD on home O2 here presenting with shortness of breath, trouble breathing .  Patient states that he went to see his asthma doctor and received Nucala shot. He thought he may have an allergic reaction and went to follow up yesterday and was prescribed prednisone and given inhaler. States that he has worsening shortness of breath and wheezing today. He is on 2 L [place] at baseline      The history is provided by the patient.    Past Medical History:  Diagnosis Date  . Anxiety disorder   . Arthritis    BILATERAL SHOULDERS, ELBOWS AND HANDS AND LEFT HIP AND KNEES--HX CORTISONE SHOTS IN SHOULDERS, ELBOWS, HIP AND KNEES  . Basal cell carcinoma of back 04/30/2016   Dermatologist- Dr Nevada Crane;   MOHS sx- Dr Levada Dy   . Bladder outlet obstruction   . Chronic idiopathic constipation 05/08/2015  . Complication of anesthesia    DIFFICULT WAKING   . COPD (chronic obstructive pulmonary disease) [place] C Frequent exacerbations 01/13/2014   [name] 07/08/14: FeV1 51% FeV1/FVC 66% FVC 59% 10/5/2015ONO RA was normal  10/13/2014  ONO on RA NORMAL   . COPD, frequent exacerbations ([place])    pulmologist-  dr Joya Gaskins--  [place] Rm Stage C.04-25-15 recent COPD exacerbation-much improved now, after tx. in ER [facility].  Marland Kitchen Depression   . Diabetes mellitus without complication ([place])    BODERLINE - DIET CONTROL  . Dysrhythmia    PVC'S  . Emphysema lung ([place])    stage 2  . Family history of adverse reaction to anesthesia    father would wake up with agitation   . Former smoker 01/13/2014  . Gastroesophageal reflux disease without esophagitis   . Heavy alcohol use 04/30/2016  . History of chronic bronchitis   . History of oxygen  administration    oxygen use 2 l/m nasally at bedtime and exertional occasions  . History of rheumatic fever   . History of TB (tuberculosis)    1984--  hospitalized for 4 month treatment  . History of urinary retention   . Hx of multiple concussions    x 2 per patient   . Hypertension   . Melanoma ([place])   . Nocturnal oxygen desaturation    USES O2 NIGHTLY  . OSA (obstructive sleep apnea) 01/13/2014  . PONV (postoperative nausea and vomiting)   . Post-polio syndrome    polio at age 8--PT WAS IN IRON LUNG; PT WAS IN W/C UNTIL AGE 71; STILL HAS WEAKNESS RIGHT SIDE  . Prostate cancer ([place])   . Schizophrenia ([place])   . Tuberculosis    Hosp 4 months rx , left early   . Urticaria     Patient Active Problem List   Diagnosis Date Noted  . COPD with acute exacerbation ([place]) 12/04/2018  . PSVT (paroxysmal supraventricular tachycardia) ([place]) 09/23/2018  . Seasonal and perennial allergic rhinitis 07/30/2018  . Asthma-COPD overlap syndrome ([place]) 07/07/2018  . S/P arthroscopy of right shoulder 04/25/2017  . Generalized anxiety disorder 04/22/2017  . Severe persistent asthma without complication 99991111  . Bee sting-induced anaphylaxis 02/12/2017  . Anaphylactic reaction due to food, initial encounter 02/12/2017  .  Current use of beta blocker 02/12/2017  . Allergic rhinitis 01/15/2017  . COPD exacerbation ([place]) 11/04/2016  . Asthma exacerbation 11/03/2016  . Nausea without vomiting 09/17/2016  . Shortness of breath   . Diabetes mellitus type 2 in obese ([place]) 06/26/2016  . non-specific Chest pain 06/12/2016  . Patient's noncompliance with other medical treatment and regimen 06/02/2016  . Hyperlipidemia, mixed 06/02/2016  . Elevated liver enzymes  06/02/2016  . Schizophrenia ([place]) 04/30/2016  . Environmental and seasonal allergies 04/30/2016  . Diabetes mellitus without complication- (diet controlled; w/o proteinuria) 04/30/2016  . Heavy alcohol use- Many yrs  04/30/2016  . Basal cell  carcinoma of back 04/30/2016  . Obesity 04/30/2016  . Hx of multiple concussions 04/30/2016  . h/o Vitamin D deficiency 04/30/2016  . Screen for sexually transmitted diseases 04/30/2016  . Immunocompromised state ([place]) 04/30/2016  . Cervical radiculopathy 01/15/2016  . Alcohol withdrawal ([place]) 11/22/2015  . Alcohol abuse with alcohol-induced mood disorder ([place]) 11/22/2015  . h/o Suicidal thoughts 11/21/2015  . Dry mouth 10/07/2015  . Anxiety 09/19/2015  . OAB (overactive bladder) 07/27/2015  . Chronic idiopathic constipation 05/08/2015  . Screening for colon cancer   . Benign neoplasm of cecum   . Benign neoplasm of ascending colon   . Benign neoplasm of transverse colon   . Benign neoplasm of sigmoid colon   . Rectal polyp   . Gastroesophageal reflux disease without esophagitis   . Esophageal stricture   . Joint stiffness of multiple sites 10/17/2014  . Benign localized hyperplasia of prostate with urinary obstruction 09/29/2014  . concerns for memory loss 09/13/2014  . Tuberculosis   . Diverticulum of bladder 06/17/2014  . Acute urinary retention 05/31/2014  . Neuropathy ([place]) 04/11/2014  . Post-polio syndrome 01/13/2014  . Other emphysema ([place]) 01/13/2014  . Hypertension 01/13/2014  . Former smoker 01/13/2014  . h/o Prostate cancer 01/13/2014  . History of tuberculosis 01/13/2014  . Bipolar disorder ([place]) 01/13/2014  . Explosive personality disorder ([place]) 01/13/2014  . Erectile dysfunction 01/13/2014  . Nephrolithiasis 01/13/2014  . Chronic pain 01/13/2014  . Obstructive sleep apnea 01/13/2014  . Alcoholic gastritis 99991111  . Nocturnal hypoxia 08/10/2013    Past Surgical History:  Procedure Laterality Date  . CARDIOVASCULAR STRESS TEST  06-08-2014  dr Mare Ferrari   normal lexiscan study/  no ischemia/  not gated due to PAC's  . COLONOSCOPY N/A 05/03/2015   Procedure: COLONOSCOPY;  Surgeon: [name], MD;  Location: [facility];  Service: Endoscopy;   Laterality: N/A;  . CYSTOSCOPY N/A 10/25/2015   Procedure: CYSTOSCOPY;  Surgeon: [name], MD;  Location: [facility];  Service: Urology;  Laterality: N/A;  . CYSTOSCOPY W/ CYSTOGRAM/  TRANSRECTAL ULTRASOUND PROSTATE BX  03-22-2009  . ESOPHAGOGASTRODUODENOSCOPY N/A 03/22/2015   Procedure: ESOPHAGOGASTRODUODENOSCOPY (EGD) with dilation;  Surgeon: [name], MD;  Location: [facility];  Service: Endoscopy;  Laterality: N/A;  . excision of skin lesion    . LAPAROSCOPIC CHOLECYSTECTOMY  2013  . left elbow surgery      due to fracture   . NASAL SEPTUM SURGERY  2000  . OTHER SURGICAL HISTORY      Muscle & bone Graft/Polio  . polio surgeries      14 polio surgeries   . PROSTATE BIOPSY N/A 09/28/2014   Procedure: PROSTATE ULTRASOUND/BIOPSY;  Surgeon: [name], MD;  Location: [facility];  Service: Urology;  Laterality: N/A;  . PROSTATE BIOPSY N/A 10/25/2015   Procedure: PROSTATE BIOPSY AND ULTRASOUND;  Surgeon:  [name], MD;  Location: [facility];  Service: Urology;  Laterality: N/A;  . SAVORY DILATION N/A 03/22/2015   Procedure: SAVORY DILATION;  Surgeon: [name], MD;  Location: [facility];  Service: Endoscopy;  Laterality: N/A;  . SHOULDER ARTHROSCOPY WITH OPEN ROTATOR CUFF REPAIR Bilateral 2013  &  1999   removal spurs and bursectomy  . TRANSURETHRAL INCISION OF BLADDER NECK N/A 10/25/2015   Procedure:  TRANSURETHRAL INCISION OF BLADDER NECK;  Surgeon: [name], MD;  Location: [facility];  Service: Urology;  Laterality: N/A;  . TRANSURETHRAL RESECTION OF PROSTATE N/A 09/28/2014   Procedure: TRANSURETHRAL RESECTION OF THE PROSTATE (TURP);  Surgeon: [name], MD;  Location: [facility];  Service: Urology;  Laterality: N/A;  . URETEROSOPY STONE EXTRACTION  2000        Home Medications    Prior to Admission medications   Medication Sig Start Date End Date Taking? Authorizing Provider  albuterol (PROVENTIL) (2.5 MG/3ML) 0.083% nebulizer solution Take 2.5 mg by nebulization every 6 (six) hours  as needed for wheezing or shortness of breath.    [provider]  albuterol (VENTOLIN HFA) 108 (90 Base) MCG/ACT inhaler Inhale 2 puffs into the lungs every 6 (six) hours as needed for wheezing or shortness of breath.    [provider]  arformoterol (BROVANA) 15 MCG/2ML NEBU Take 2 mLs (15 mcg total) by nebulization 2 (two) times daily. 07/22/19   Valentina Shaggy, MD  bisoprolol (ZEBETA) 5 MG tablet Take 1 tablet (5 mg total) by mouth daily. 12/10/18   [name], FNP  budesonide (PULMICORT) 0.5 MG/2ML nebulizer solution Take 2 mLs (0.5 mg total) by nebulization 2 (two) times daily. 07/22/19   Valentina Shaggy, MD  cetirizine (ZYRTEC) 10 MG tablet Take 1 tablet (10 mg total) by mouth daily. Take 1 tablet 2 hours before you receive the Nucala injection 07/17/19   Ambs, Kathrine Cords, FNP  EPINEPHrine (EPIPEN 2-PAK) 0.3 mg/0.3 mL IJ SOAJ injection Inject 0.3 mLs (0.3 mg total) into the muscle as needed for anaphylaxis. 07/03/19   Valentina Shaggy, MD  esomeprazole (NEXIUM) 40 MG capsule Take 40 mg by mouth daily at 12 noon.    [provider]  levalbuterol Penne Lash) 1.25 MG/3ML nebulizer solution 1.25 mg via nebulizer every 4-6 hours as needed. 07/22/19   Garnet Sierras, DO  loratadine (CLARITIN) 10 MG tablet Take 1 tablet (10 mg total) by mouth daily as needed for allergies. 05/22/19   Valentina Shaggy, MD  montelukast (SINGULAIR) 10 MG tablet Take 1 tablet (10 mg total) by mouth at bedtime. 05/22/19   Valentina Shaggy, MD  nitroGLYCERIN (NITROSTAT) 0.4 MG SL tablet Place 1 tablet (0.4 mg total) under the tongue every 5 (five) minutes as needed for chest pain. 06/21/15   Brunetta Jeans, PA-C  NUCALA 100 MG SOLR  06/24/19   [provider]  omeprazole (PRILOSEC) 40 MG capsule Take 1 capsule (40 mg total) by mouth daily. 05/22/19   Valentina Shaggy, MD  OXYGEN Inhale 2 L into the lungs at bedtime. DURING THE DAY IF NEEDED Reported on 11/25/2015     [provider]  predniSONE (DELTASONE) 20 MG tablet Take 3 tablets (60 mg total) by mouth daily for 2 days, THEN 2 tablets (40 mg total) daily for 3 days, THEN 1 tablet (20 mg total) daily for 3 days. 07/22/19 07/30/19  Garnet Sierras, DO  predniSONE (DELTASONE) 5 MG tablet Take 5 mg  by mouth 2 (two) times daily.    [provider]  Revefenacin (YUPELRI) 175 MCG/3ML SOLN Inhale 1 vial into the lungs daily. 06/11/19   Valentina Shaggy, MD    Family History Family History  Problem Relation Age of Onset  . Alzheimer's disease Father 4       Deceased  . Stomach cancer Father   . Heart attack Father   . Heart disease Father   . Skin cancer Mother        Facial-Living  . Alcohol abuse Sister        x2  . Mental illness Sister        x2  . Alcohol abuse Sister   . Diabetes Maternal Aunt        x2  . Thyroid disease Maternal Aunt        x4  . Diabetes Maternal Uncle   . Tuberculosis Paternal Grandfather   . Tuberculosis Paternal Grandmother   . Alzheimer's disease Paternal Aunt   . Alzheimer's disease Paternal Uncle   . Colon cancer Neg Hx   . Colon polyps Neg Hx   . Crohn's disease Neg Hx   . Ulcerative colitis Neg Hx   . Allergic rhinitis Neg Hx   . Angioedema Neg Hx   . Asthma Neg Hx   . Eczema Neg Hx   . Urticaria Neg Hx     Social History Social History   Tobacco Use  . Smoking status: Former Smoker    Years: 47.00    Types: Pipe    Quit date: 08/25/2013    Years since quitting: 5.9  . Smokeless tobacco: Former Systems developer    Types: Chew  . Tobacco comment: 5-6 times smoking a pipe  Substance Use Topics  . Alcohol use: Yes    Alcohol/week: 14.0 standard drinks    Types: 14 Cans of beer per week    Comment: 2 cans daily  . Drug use: No     Allergies   Aspirin, Hornet venom, Ivp dye [iodinated diagnostic agents], Levaquin [levofloxacin in d5w], Nsaids, Penicillins, Tolmetin, Fasenra [benralizumab], Perforomist [formoterol], Buprenorphine hcl,  Morphine and related, and Oxycodone   Review of Systems Review of Systems  Respiratory: Positive for cough and shortness of breath.   All other systems reviewed and are negative.    Physical Exam Updated Vital Signs BP (!) 178/85 (BP Location: Left Arm)   Pulse (!) 111   Temp 98.9 F (37.2 C) (Oral)   Resp (!) 28   Physical Exam Vitals signs and nursing note reviewed.  Constitutional:      Comments: tachypneic   HENT:     Head: Normocephalic.  Eyes:     Pupils: Pupils are equal, round, and reactive to light.  Neck:     Musculoskeletal: Normal range of motion.  Cardiovascular:     Rate and Rhythm: Normal rate and regular rhythm.  Pulmonary:     Comments: Moderate distress, + wheezing throughout, mild retractions  Musculoskeletal: Normal range of motion.  Skin:    General: Skin is warm.     Capillary Refill: Capillary refill takes less than 2 seconds.  Neurological:     General: No focal deficit present.     Mental Status: He is alert and oriented to person, place, and time.  Psychiatric:        Mood and Affect: Mood normal.        Behavior: Behavior normal.      ED Treatments / Results  Labs (all labs ordered are listed, but only abnormal results are displayed) Labs Reviewed  SARS CORONAVIRUS 2 BY RT PCR (HOSPITAL ORDER, [facility])  CBC WITH DIFFERENTIAL/PLATELET  COMPREHENSIVE METABOLIC PANEL  BLOOD GAS, VENOUS  TROPONIN I (HIGH SENSITIVITY)    EKG EKG Interpretation  Date/Time:  Thursday July 23 2019 18:34:14 EDT Ventricular Rate:  101 PR Interval:    QRS Duration: 99 QT Interval:  353 QTC Calculation: 458 R Axis:   85 Text Interpretation:  Sinus tachycardia Probable left atrial enlargement Consider right ventricular hypertrophy No significant change since last tracing Confirmed by Wandra Arthurs [phone]) on 07/23/2019 6:59:53 PM   Radiology No results found.  Procedures Procedures (including critical care time)   Medications Ordered in ED Medications  methylPREDNISolone sodium succinate (SOLU-MEDROL) 125 mg/2 mL injection 125 mg (has no administration in time range)  magnesium sulfate IVPB 2 g 50 mL (has no administration in time range)  albuterol (VENTOLIN HFA) 108 (90 Base) MCG/ACT inhaler 2 puff (has no administration in time range)     Initial Impression / Assessment and Plan / ED Course  I have reviewed the triage vital signs and the nursing notes.  Pertinent labs & imaging results that were available during my care of the patient were reviewed by me and considered in my medical decision making (see chart for details).       GURKARAN SCHUCH is a 67 y.o. male  Here with cough, wheezing. Likely asthma vs COPD exacerbation. Will get labs, CXR. Has no COVID contacts but is SOB and wheezing so will send off COVID.   9:19 PM No wheezing now. Stable on home O2. Labs and CXR unremarkable. Stable for discharge. Has prednisone and inhalers at home   Final Clinical Impressions(s) / ED Diagnoses   Final diagnoses:  None    ED Discharge Orders    None       Drenda Freeze, MD 07/23/19 2120

## 2019-07-24 ENCOUNTER — Telehealth: Payer: Self-pay | Admitting: Allergy

## 2019-07-24 MED ORDER — NEBULIZER/TUBING/MOUTHPIECE KIT: 1.0000 | PACK | 5 refills | Status: AC

## 2019-07-24 MED ORDER — IPRATROPIUM BROMIDE 0.02 % IN SOLN: 0.5000 mg | Freq: Four times a day (QID) | RESPIRATORY_TRACT | 5 refills | Status: DC

## 2019-07-24 NOTE — Telephone Encounter (Signed)
Let's add on Atrovent to his Xopenex to use every four fours through the weekend and as needed thereafter.   [name], MD [facility] [place]

## 2019-07-24 NOTE — Telephone Encounter (Signed)
Patient went to the hospital last night. He is home now and still not feeling well. He was told he needed to be seen by [facility]. He would like to know if he needs to do this.

## 2019-07-24 NOTE — Telephone Encounter (Signed)
Med has been sent. I spent 15-20 minutes explaining very thoroughly how to use his medications. Patient verbalized understanding.

## 2019-07-24 NOTE — Addendum Note (Signed)
Addended by: Lucrezia Starch I on: 07/24/2019 05:09 PM   Modules accepted: Orders

## 2019-07-24 NOTE — Telephone Encounter (Signed)
[name] D 4 hours ago (10:04 AM)     Patient went to the hospital last night. He is home now and still not feeling well. He was told he needed to be seen by [facility]. He would like to know if he needs to do this.

## 2019-07-24 NOTE — Telephone Encounter (Signed)
Please advise on med recommendations.

## 2019-07-24 NOTE — Telephone Encounter (Signed)
No, I think you have out covered it.  I would make sure he is doing his rescue inhaler every 4 hours scheduled over the course of the weekend.  I will try to give him a call once or twice this week and to make sure he is doing okay.  [name], MD [facility] [place]

## 2019-07-24 NOTE — Telephone Encounter (Signed)
I placed follow up call to patient. He was very winded on the phone and complaining of chest tightness. He is taking the prednisone but in intervals of 20 mg doses until he reaches the 60 mg Dr. Maudie Mercury wanted him on. Patient mentioned returning to the hospital but he was unsure. He told me that he felt like bleep more than once on the phone. I told him that if he felt his breathing or the chest tightness worsening to proceed to the hospital and if they suggested to keep him overnight to please allow them to do so as it seems his breathing is not getting any better. He assured me that he would do as instructed. Dr. Ernst Bowler do you have any further recommendations for the patient. He is already doing all of his medications as prescribed.

## 2019-07-27 ENCOUNTER — Ambulatory Visit: Payer: Medicare HMO | Admitting: Pulmonary Disease

## 2019-07-27 NOTE — Telephone Encounter (Signed)
FYI

## 2019-07-27 NOTE — Telephone Encounter (Signed)
Dee left a VM for him advising him that the nebulizer is ready for pick up in [facility] office.

## 2019-07-27 NOTE — Telephone Encounter (Signed)
Patient will pick up neb machine from [facility] office 07-28-2019.

## 2019-07-27 NOTE — Progress Notes (Deleted)
Subjective:   PATIENT ID: [name] GENDER: male DOB: 10-21-1950, MRN: [id number]   HPI  No chief complaint on file.   Reason for Visit: ***Follow-up ***New consult for ***  *** Social History:  Environmental exposures: ***  I have personally reviewed patient's past medical/family/social history, allergies, current medications.***  Past Medical History:  Diagnosis Date  . Anxiety disorder   . Arthritis    BILATERAL SHOULDERS, ELBOWS AND HANDS AND LEFT HIP AND KNEES--HX CORTISONE SHOTS IN SHOULDERS, ELBOWS, HIP AND KNEES  . Basal cell carcinoma of back 04/30/2016   Dermatologist- Dr Nevada Crane;   MOHS sx- Dr Levada Dy   . Bladder outlet obstruction   . Chronic idiopathic constipation 05/08/2015  . Complication of anesthesia    DIFFICULT WAKING   . COPD (chronic obstructive pulmonary disease) Gold C Frequent exacerbations 01/13/2014   [name] 07/08/14: FeV1 51% FeV1/FVC 66% FVC 59% 10/5/2015ONO RA was normal  10/13/2014  ONO on RA NORMAL   . COPD, frequent exacerbations ([place])    pulmologist-  dr Joya Gaskins--  Girtha Rm Stage C.04-25-15 recent COPD exacerbation-much improved now, after tx. in ER [facility].  Marland Kitchen Depression   . Diabetes mellitus without complication ([place])    BODERLINE - DIET CONTROL  . Dysrhythmia    PVC'S  . Emphysema lung ([place])    stage 2  . Family history of adverse reaction to anesthesia    father would wake up with agitation   . Former smoker 01/13/2014  . Gastroesophageal reflux disease without esophagitis   . Heavy alcohol use 04/30/2016  . History of chronic bronchitis   . History of oxygen administration    oxygen use 2 l/m nasally at bedtime and exertional occasions  . History of rheumatic fever   . History of TB (tuberculosis)    1984--  hospitalized for 4 month treatment  . History of urinary retention   . Hx of multiple concussions    x 2 per patient   . Hypertension   . Melanoma ([place])   . Nocturnal oxygen desaturation    USES O2 NIGHTLY  . OSA  (obstructive sleep apnea) 01/13/2014  . PONV (postoperative nausea and vomiting)   . Post-polio syndrome    polio at age 52--PT WAS IN IRON LUNG; PT WAS IN W/C UNTIL AGE 66; STILL HAS WEAKNESS RIGHT SIDE  . Prostate cancer ([place])   . Schizophrenia ([place])   . Tuberculosis    Hosp 4 months rx , left early   . Urticaria      Family History  Problem Relation Age of Onset  . Alzheimer's disease Father 4       Deceased  . Stomach cancer Father   . Heart attack Father   . Heart disease Father   . Skin cancer Mother        Facial-Living  . Alcohol abuse Sister        x2  . Mental illness Sister        x2  . Alcohol abuse Sister   . Diabetes Maternal Aunt        x2  . Thyroid disease Maternal Aunt        x4  . Diabetes Maternal Uncle   . Tuberculosis Paternal Grandfather   . Tuberculosis Paternal Grandmother   . Alzheimer's disease Paternal Aunt   . Alzheimer's disease Paternal Uncle   . Colon cancer Neg Hx   . Colon polyps Neg Hx   . Crohn's disease Neg Hx   .  Ulcerative colitis Neg Hx   . Allergic rhinitis Neg Hx   . Angioedema Neg Hx   . Asthma Neg Hx   . Eczema Neg Hx   . Urticaria Neg Hx      Social History   Occupational History  . Occupation: Retired  Tobacco Use  . Smoking status: Former Smoker    Years: 47.00    Types: Pipe    Quit date: 08/25/2013    Years since quitting: 5.9  . Smokeless tobacco: Former Systems developer    Types: Chew  . Tobacco comment: 5-6 times smoking a pipe  Substance and Sexual Activity  . Alcohol use: Yes    Alcohol/week: 14.0 standard drinks    Types: 14 Cans of beer per week    Comment: 2 cans daily  . Drug use: No  . Sexual activity: Yes    Birth control/protection: None    Allergies  Allergen Reactions  . Aspirin Anaphylaxis  . Hornet Venom Anaphylaxis  . Ivp Dye [Iodinated Diagnostic Agents] Anaphylaxis  . Levaquin [Levofloxacin In D5w] Shortness Of Breath and Swelling    In addition: sweating, chest pain, and diarrhea.   .  Nsaids Anaphylaxis  . Penicillins Anaphylaxis    Heart stops Has patient had a PCN reaction causing immediate rash, facial/tongue/throat swelling, SOB or lightheadedness with hypotension: yes Has patient had a PCN reaction causing severe rash involving mucus membranes or skin necrosis: NO Has patient had a PCN reaction that required hospitalization yes Has patient had a PCN reaction occurring within the last 10 years: No If all of the above answers are "NO", then may proceed with Cephalosporin use.   . Tolmetin Anaphylaxis  . Fasenra [Benralizumab] Hives  . Perforomist [Formoterol]     Increased wheezing, shortness of breath  . Buprenorphine Hcl Nausea And Vomiting    Can take with zofran   . Morphine And Related Nausea And Vomiting    Can take with zofran   . Oxycodone Itching and Rash     Outpatient Medications Prior to Visit  Medication Sig Dispense Refill  . albuterol (PROVENTIL) (2.5 MG/3ML) 0.083% nebulizer solution Take 2.5 mg by nebulization every 6 (six) hours as needed for wheezing or shortness of breath.    Marland Kitchen albuterol (VENTOLIN HFA) 108 (90 Base) MCG/ACT inhaler Inhale 2 puffs into the lungs every 6 (six) hours as needed for wheezing or shortness of breath.    Marland Kitchen arformoterol (BROVANA) 15 MCG/2ML NEBU Take 2 mLs (15 mcg total) by nebulization 2 (two) times daily. 360 mL 1  . bisoprolol (ZEBETA) 5 MG tablet Take 1 tablet (5 mg total) by mouth daily. 90 tablet 1  . budesonide (PULMICORT) 0.5 MG/2ML nebulizer solution Take 2 mLs (0.5 mg total) by nebulization 2 (two) times daily. 360 mL 1  . cetirizine (ZYRTEC) 10 MG tablet Take 1 tablet (10 mg total) by mouth daily. Take 1 tablet 2 hours before you receive the Nucala injection 30 tablet 0  . EPINEPHrine (EPIPEN 2-PAK) 0.3 mg/0.3 mL IJ SOAJ injection Inject 0.3 mLs (0.3 mg total) into the muscle as needed for anaphylaxis. 1 each 2  . esomeprazole (NEXIUM) 40 MG capsule Take 40 mg by mouth daily at 12 noon.    Marland Kitchen ipratropium  (ATROVENT) 0.02 % nebulizer solution Take 2.5 mLs (0.5 mg total) by nebulization 4 (four) times daily. With Xopenex nebulizer solution. 75 mL 5  . levalbuterol (XOPENEX) 1.25 MG/3ML nebulizer solution 1.25 mg via nebulizer every 4-6 hours as needed. 72 mL  2  . loratadine (CLARITIN) 10 MG tablet Take 1 tablet (10 mg total) by mouth daily as needed for allergies. 30 tablet 5  . montelukast (SINGULAIR) 10 MG tablet Take 1 tablet (10 mg total) by mouth at bedtime. 30 tablet 5  . nitroGLYCERIN (NITROSTAT) 0.4 MG SL tablet Place 1 tablet (0.4 mg total) under the tongue every 5 (five) minutes as needed for chest pain. 30 tablet 3  . NUCALA 100 MG SOLR     . omeprazole (PRILOSEC) 40 MG capsule Take 1 capsule (40 mg total) by mouth daily. 30 capsule 5  . OXYGEN Inhale 2 L into the lungs at bedtime. DURING THE DAY IF NEEDED Reported on 11/25/2015    . predniSONE (DELTASONE) 20 MG tablet Take 3 tablets (60 mg total) by mouth daily for 2 days, THEN 2 tablets (40 mg total) daily for 3 days, THEN 1 tablet (20 mg total) daily for 3 days. 15 tablet 0  . predniSONE (DELTASONE) 5 MG tablet Take 5 mg by mouth 2 (two) times daily.    Marland Kitchen Respiratory Therapy Supplies (NEBULIZER/TUBING/MOUTHPIECE) KIT 1 each by Does not apply route as directed. 2 kit 5  . Revefenacin (YUPELRI) 175 MCG/3ML SOLN Inhale 1 vial into the lungs daily. 270 mL 1   No facility-administered medications prior to visit.     ROS   Objective:  There were no vitals filed for this visit.    Physical Exam: General: Well-appearing, no acute distress HENT: [place], AT, OP clear, MMM Eyes: EOMI, no scleral icterus Lymph: no cervical lymphadenopathy Respiratory: Clear to auscultation bilaterally.  No crackles, wheezing or rales Cardiovascular: RRR, -M/R/G, no JVD GI: BS+, soft, nontender Extremities:-Edema,-tenderness Neuro: AAO x4, CNII-XII grossly intact Skin: Intact, no rashes or bruising Psych: Normal mood, normal affect  Data Reviewed:   Imaging:  PFT:  Labs:  Imaging, labs and tests noted above have been reviewed independently by me.    Assessment & Plan:   Discussion: ***  Health Maintenance Pneumonia*** Influenza*** CT Lung Screen***  No orders of the defined types were placed in this encounter. No orders of the defined types were placed in this encounter.   No follow-ups on file.  [place], MD [facility] 07/27/2019 1:34 PM  Office Number [phone]

## 2019-07-27 NOTE — Telephone Encounter (Signed)
I apologize - my VPN access was not working over the weekend, so this is a late entry. I talked to the patient on Saturday morning (October 10th). He was doing much better and making full sentences. He tells me that addition of the Atrovent has helped with his symptoms. He feels that the asthma exacerbation was likely due to his wife's viral URI. He is not convinced that this has anything to do with the Nucala. He is open to doing another Nucala injection to see if this helps. He does note that he felt better on the evening after the shot last week.  On another note, apparently the ED made a Pulmonology appointment with Dr. Loanne Drilling while he was in the ED. [name] was not excited about this at all (he has "fired" 4 or 5 Pulmonologists in the past), but he did tell me that he was willing to see her at least once. I told him that I thought this was a good idea and I did ask him to try to be nice to the Pulmonologist. He said he would try.  He also requested a new nebulizer machine. I will have one ready for him on Monday to pick up. I was reassured that he was making full sentences during our talk on the phone and he was boisterous as per his usual self.    Jonathon Marvel, MD All[facility] and [place] [facility] [place]

## 2019-07-27 NOTE — Telephone Encounter (Signed)
Patient called stating he talked to Dr Ernst Bowler this weekend about getting a new nebulizer and mouth pieces.   Patient states Dr Ernst Bowler told him one would be ready for him in [place] for pick up.  I did not see one up front.  Please Advise.

## 2019-07-28 ENCOUNTER — Telehealth: Payer: Self-pay | Admitting: Allergy & Immunology

## 2019-07-28 NOTE — Telephone Encounter (Signed)
Pt called and said that his hives have come back every night since sat. And the meds is not working. [place]. [phone]

## 2019-07-28 NOTE — Telephone Encounter (Signed)
Let's make sure that he is using antihistamines and double the dosing to BID.   [name], MD Allergy and [place] [facility] [place]

## 2019-07-28 NOTE — Telephone Encounter (Signed)
Dr. Gallagher? 

## 2019-07-29 NOTE — Telephone Encounter (Signed)
Call to [facility] to see what was happening with his Brovana prescription.  Spoke with customer service representative, Zigmund Daniel, she states that the system is down and we need to call back later.  Please call back at number:  [phone].

## 2019-07-29 NOTE — Telephone Encounter (Signed)
Call from patient,states that he is taking the loratadine, 2 tablets BID.   Pt is also concerned that when he called [facility] that they do not have a prescription for the Brovana that was signed.  He is on his last dose today and is worried that he does not have his medication.  Would like to know what he can replace it with until he gets the [place].  Has enough budesonide. Levalbuterol is burning inside his throat and his chest.  When he urinates it burns.  Thinks this is related.  Would like to use just regular albuterol 0.083%. When he is laying down at night, after using the levalbuterol, he states that burning liquid came back up from his throat.  Has been using his GERD medications. This happened x 3 days.  So he decided to stop it and it has not happened since then.  After doing all his medications he coughs and coughs, wants to know what to do for this, wants to know if he needs a cough medication.   Please advise

## 2019-07-29 NOTE — Telephone Encounter (Signed)
Call to [facility], [name] was delivered today via [place]. Call to patient to make him aware.  Per [name], meds are being delivered to his sisters house because [employer] can't find his house.

## 2019-07-30 NOTE — Telephone Encounter (Signed)
Attempted to call patient to let him know that it is okay to continue to use albuterol neb solution.  Line was busy, unable to contact patient.

## 2019-07-30 NOTE — Telephone Encounter (Signed)
I cannot imagine that Xopenex is causing pain with pain, but if he wants to go back to regular albuterol that is just fine.  Also we have no sway over [facility], so not sure what he expects [name] to do.  I do not believe we have samples of Brovana.  [name], MD [facility] and [place] [facility] [place]

## 2019-07-31 MED ORDER — MEPOLIZUMAB 100 MG ~~LOC~~ SOLR
100.0000 mg | SUBCUTANEOUS | Status: DC
  Administered 2019-07-20: 100 mg via SUBCUTANEOUS

## 2019-07-31 NOTE — Addendum Note (Signed)
Addended by: Carin Hock on: 07/31/2019 12:35 PM   Modules accepted: Orders

## 2019-08-03 ENCOUNTER — Ambulatory Visit: Payer: Medicare Other | Admitting: Pulmonary Disease

## 2019-08-03 ENCOUNTER — Telehealth: Payer: Self-pay | Admitting: Allergy & Immunology

## 2019-08-03 NOTE — Telephone Encounter (Signed)
FYI

## 2019-08-03 NOTE — Telephone Encounter (Signed)
Patient called stating that he seen Dr. Marcell Anger in [place] last week and did blood work and DR thinks the patient has pneumonia. The patient states that he has to go back tomorrow to do more blood work.   Patient wanted to know how he might have pneumonia when he has been using his nebulizer and taking his medications.   Please advise.

## 2019-08-03 NOTE — Telephone Encounter (Signed)
Error

## 2019-08-03 NOTE — Progress Notes (Deleted)
Subjective:   PATIENT ID: [name] GENDER: male DOB: [birth year], MRN: [id number]   HPI  No chief complaint on file.   Reason for Visit: New consult for severe persistent asthma *** Social History:  Environmental exposures: ***  I have personally reviewed patient's past medical/family/social history, allergies, current medications.***  Past Medical History:  Diagnosis Date  . Anxiety disorder   . Arthritis    BILATERAL SHOULDERS, ELBOWS AND HANDS AND LEFT HIP AND KNEES--HX CORTISONE SHOTS IN SHOULDERS, ELBOWS, HIP AND KNEES  . Basal cell carcinoma of back 04/30/2016   Dermatologist- Dr Nevada Crane;   MOHS sx- Dr Levada Dy   . Bladder outlet obstruction   . Chronic idiopathic constipation 05/08/2015  . Complication of anesthesia    DIFFICULT WAKING   . COPD (chronic obstructive pulmonary disease) Gold C Frequent exacerbations 01/13/2014   [name] 07/08/14: FeV1 51% FeV1/FVC 66% FVC 59% 10/5/2015ONO RA was normal  10/13/2014  ONO on RA NORMAL   . COPD, frequent exacerbations ([place])    pulmologist-  dr Joya Gaskins--  Girtha Rm Stage C.04-25-15 recent COPD exacerbation-much improved now, after tx. in ER [facility].  Marland Kitchen Depression   . Diabetes mellitus without complication ([place])    BODERLINE - DIET CONTROL  . Dysrhythmia    PVC'S  . Emphysema lung ([place])    stage 2  . Family history of adverse reaction to anesthesia    father would wake up with agitation   . Former smoker 01/13/2014  . Gastroesophageal reflux disease without esophagitis   . Heavy alcohol use 04/30/2016  . History of chronic bronchitis   . History of oxygen administration    oxygen use 2 l/m nasally at bedtime and exertional occasions  . History of rheumatic fever   . History of TB (tuberculosis)    1984--  hospitalized for 4 month treatment  . History of urinary retention   . Hx of multiple concussions    x 2 per patient   . Hypertension   . Melanoma ([place])   . Nocturnal oxygen desaturation    USES O2 NIGHTLY  . OSA  (obstructive sleep apnea) 01/13/2014  . PONV (postoperative nausea and vomiting)   . Post-polio syndrome    polio at age 44--PT WAS IN IRON LUNG; PT WAS IN W/C UNTIL AGE 31; STILL HAS WEAKNESS RIGHT SIDE  . Prostate cancer ([place])   . Schizophrenia ([place])   . Tuberculosis    Hosp 4 months rx , left early   . Urticaria      Family History  Problem Relation Age of Onset  . Alzheimer's disease Father 4       Deceased  . Stomach cancer Father   . Heart attack Father   . Heart disease Father   . Skin cancer Mother        Facial-Living  . Alcohol abuse Sister        x2  . Mental illness Sister        x2  . Alcohol abuse Sister   . Diabetes Maternal Aunt        x2  . Thyroid disease Maternal Aunt        x4  . Diabetes Maternal Uncle   . Tuberculosis Paternal Grandfather   . Tuberculosis Paternal Grandmother   . Alzheimer's disease Paternal Aunt   . Alzheimer's disease Paternal Uncle   . Colon cancer Neg Hx   . Colon polyps Neg Hx   . Crohn's disease Neg Hx   .  Ulcerative colitis Neg Hx   . Allergic rhinitis Neg Hx   . Angioedema Neg Hx   . Asthma Neg Hx   . Eczema Neg Hx   . Urticaria Neg Hx      Social History   Occupational History  . Occupation: Retired  Tobacco Use  . Smoking status: Former Smoker    Years: 47.00    Types: Pipe    Quit date: 08/25/2013    Years since quitting: 5.9  . Smokeless tobacco: Former Systems developer    Types: Chew  . Tobacco comment: 5-6 times smoking a pipe  Substance and Sexual Activity  . Alcohol use: Yes    Alcohol/week: 14.0 standard drinks    Types: 14 Cans of beer per week    Comment: 2 cans daily  . Drug use: No  . Sexual activity: Yes    Birth control/protection: None    Allergies  Allergen Reactions  . Aspirin Anaphylaxis  . Hornet Venom Anaphylaxis  . Ivp Dye [Iodinated Diagnostic Agents] Anaphylaxis  . Levaquin [Levofloxacin In D5w] Shortness Of Breath and Swelling    In addition: sweating, chest pain, and diarrhea.   .  Nsaids Anaphylaxis  . Penicillins Anaphylaxis    Heart stops Has patient had a PCN reaction causing immediate rash, facial/tongue/throat swelling, SOB or lightheadedness with hypotension: yes Has patient had a PCN reaction causing severe rash involving mucus membranes or skin necrosis: NO Has patient had a PCN reaction that required hospitalization yes Has patient had a PCN reaction occurring within the last 10 years: No If all of the above answers are "NO", then may proceed with Cephalosporin use.   . Tolmetin Anaphylaxis  . Fasenra [Benralizumab] Hives  . Perforomist [Formoterol]     Increased wheezing, shortness of breath  . Buprenorphine Hcl Nausea And Vomiting    Can take with zofran   . Morphine And Related Nausea And Vomiting    Can take with zofran   . Oxycodone Itching and Rash     Outpatient Medications Prior to Visit  Medication Sig Dispense Refill  . albuterol (PROVENTIL) (2.5 MG/3ML) 0.083% nebulizer solution Take 2.5 mg by nebulization every 6 (six) hours as needed for wheezing or shortness of breath.    Marland Kitchen albuterol (VENTOLIN HFA) 108 (90 Base) MCG/ACT inhaler Inhale 2 puffs into the lungs every 6 (six) hours as needed for wheezing or shortness of breath.    Marland Kitchen arformoterol (BROVANA) 15 MCG/2ML NEBU Take 2 mLs (15 mcg total) by nebulization 2 (two) times daily. 360 mL 1  . bisoprolol (ZEBETA) 5 MG tablet Take 1 tablet (5 mg total) by mouth daily. 90 tablet 1  . budesonide (PULMICORT) 0.5 MG/2ML nebulizer solution Take 2 mLs (0.5 mg total) by nebulization 2 (two) times daily. 360 mL 1  . cetirizine (ZYRTEC) 10 MG tablet Take 1 tablet (10 mg total) by mouth daily. Take 1 tablet 2 hours before you receive the Nucala injection 30 tablet 0  . EPINEPHrine (EPIPEN 2-PAK) 0.3 mg/0.3 mL IJ SOAJ injection Inject 0.3 mLs (0.3 mg total) into the muscle as needed for anaphylaxis. 1 each 2  . esomeprazole (NEXIUM) 40 MG capsule Take 40 mg by mouth daily at 12 noon.    Marland Kitchen ipratropium  (ATROVENT) 0.02 % nebulizer solution Take 2.5 mLs (0.5 mg total) by nebulization 4 (four) times daily. With Xopenex nebulizer solution. 75 mL 5  . levalbuterol (XOPENEX) 1.25 MG/3ML nebulizer solution 1.25 mg via nebulizer every 4-6 hours as needed. 72 mL  2  . loratadine (CLARITIN) 10 MG tablet Take 1 tablet (10 mg total) by mouth daily as needed for allergies. 30 tablet 5  . montelukast (SINGULAIR) 10 MG tablet Take 1 tablet (10 mg total) by mouth at bedtime. 30 tablet 5  . nitroGLYCERIN (NITROSTAT) 0.4 MG SL tablet Place 1 tablet (0.4 mg total) under the tongue every 5 (five) minutes as needed for chest pain. 30 tablet 3  . NUCALA 100 MG SOLR     . omeprazole (PRILOSEC) 40 MG capsule Take 1 capsule (40 mg total) by mouth daily. 30 capsule 5  . OXYGEN Inhale 2 L into the lungs at bedtime. DURING THE DAY IF NEEDED Reported on 11/25/2015    . predniSONE (DELTASONE) 5 MG tablet Take 5 mg by mouth 2 (two) times daily.    Marland Kitchen Respiratory Therapy Supplies (NEBULIZER/TUBING/MOUTHPIECE) KIT 1 each by Does not apply route as directed. 2 kit 5  . Revefenacin (YUPELRI) 175 MCG/3ML SOLN Inhale 1 vial into the lungs daily. 270 mL 1   Facility-Administered Medications Prior to Visit  Medication Dose Route Frequency Provider Last Rate Last Dose  . Mepolizumab SOLR 100 mg  100 mg Subcutaneous Q28 days Valentina Shaggy, MD   100 mg at 07/20/19 1500    ROS   Objective:  There were no vitals filed for this visit.    Physical Exam: General: Well-appearing, no acute distress HENT: [place], AT, OP clear, MMM Eyes: EOMI, no scleral icterus Lymph: no cervical lymphadenopathy Respiratory: Clear to auscultation bilaterally.  No crackles, wheezing or rales Cardiovascular: RRR, -M/R/G, no JVD GI: BS+, soft, nontender Extremities:-Edema,-tenderness Neuro: AAO x4, CNII-XII grossly intact Skin: Intact, no rashes or bruising Psych: Normal mood, normal affect  Data Reviewed:  Imaging:  PFT:  Labs:   Imaging, labs and tests noted above have been reviewed independently by me.    Assessment & Plan:   Discussion: ***  Health Maintenance Pneumonia*** Influenza*** CT Lung Screen***  No orders of the defined types were placed in this encounter. No orders of the defined types were placed in this encounter.   No follow-ups on file.  [place], MD [place] [facility] 08/03/2019 8:29 AM  Office Number [phone]

## 2019-08-04 NOTE — Telephone Encounter (Signed)
I do not know the details of his current diagnosis of pneumonia, so I really cannot comment on that. I am glad that he is using his nebulizers as prescribed. That is excellent. But he has smoked for decades and that does damage that nebulizers cannot fix completely. These are not magic bullets.   [name], MD Allergy and [place] [facility] [place]

## 2019-08-06 ENCOUNTER — Telehealth: Payer: Self-pay | Admitting: Allergy & Immunology

## 2019-08-06 NOTE — Telephone Encounter (Signed)
Thanks for following up with him, Lonn [place]!   [name], MD Allergy and [place] [facility] [place]

## 2019-08-06 NOTE — Telephone Encounter (Signed)
I spoke with [name] today. He is doing much better than before. He tells me that he has eliminated all of his other medications for asthma/allergies except for his prednisone, Claritin, Brovana, Pulmicort and Yupelri. He is doing much better during the day but he is having issues with some chest tightness at night. He using his oxygen at night as prescribed. He is going to keep the follow up on Monday with Dr. Marcell Anger. He did have an issue with the nebulizer machine that he picked up from our [place] location. He told me that it does not seem to be pushing air out as strongly as the machine he had before. I let him know that unfortunately we were not able to take that back but he could contact [employer] to see if they would offer another machine. I did let him know that he may have to pay out of pocket for this one and he said that would be okay. I let him know that you may call him back if anything further was needed.

## 2019-08-06 NOTE — Telephone Encounter (Signed)
[name], he said that the company is [employer] and fax is [phone]. They need orders to give hime a bigger neb. Machine and needs oxygen to carry around with him.

## 2019-08-07 NOTE — Telephone Encounter (Signed)
I will let him know to discuss portable O2 therapy with pulm or pcp. Please advise on authorization for the nebulizer machine he is requesting?

## 2019-08-07 NOTE — Telephone Encounter (Signed)
Dr. Ernst Bowler please advise on neb machine. I do not believe we were the ones that ordered the oxygen for him. I think that was his pulm. ??

## 2019-08-07 NOTE — Telephone Encounter (Signed)
I have never prescribed oxygen for anyone, so I have no idea how to do this. His PCP or Pulmonologist should do this, although I think he has likely fired the pulmonologist that prescribed the O2 last, if I had to venture a guess.  [name], MD Allergy and [place] [facility] [place]

## 2019-08-10 ENCOUNTER — Ambulatory Visit: Payer: Medicare HMO | Admitting: Internal Medicine

## 2019-08-10 NOTE — Telephone Encounter (Signed)
We did. He is requesting a more powerful one. He states that the one we gave him is not putting out enough air like the one he had before.

## 2019-08-10 NOTE — Telephone Encounter (Signed)
I am fine with the nebulizer. Didn't we give him one in the last week or so?  [name], MD Allergy and [place] [facility] [place]

## 2019-08-11 NOTE — Telephone Encounter (Signed)
I guess we could send in a script to a pharmacy for another nebulizer. This would be out of pocket though, I'm guessing, since we just gave him one. He could also call up the company and complain.   [name], MD Allergy and [place] [facility] [place]

## 2019-08-11 NOTE — Telephone Encounter (Signed)
I spoke with Mr. Kubes. He has cancelled the pulmonology appointment. He is seeing a new primary care physician on Friday and expects them to refer him to pulm, again. I told him that he should definitely see the pulmonologist in regards to his oxygen therapy. I explained that the nebulizer machine he was given is one of the better ones that we have and that it does offer the fastest treatment flow. He seemed to be accepting of this and will do okay with the neb given from [facility] office. He would like to know what Dr. Ernst Bowler would like for him to do in regards to his Nucala injection. He states that he is okay with receiving it just that he does not react until about 8-10 hours later. Please advise and thank you.

## 2019-08-13 NOTE — Telephone Encounter (Signed)
You are the doctor! Just let me know what you would like to do.

## 2019-08-13 NOTE — Telephone Encounter (Signed)
I had talked to [name] a couple of weeks ago after his first injection and he said that he wanted to give it another try. He said that he likely just caught a virus from his wife which led to his exacerbation. I am open to his getting it again. Maybe we could give him a mini prednisone burst before and on the day of the injection to alleviate any problems.   [name], MD Allergy and [place] [facility] [place]

## 2019-08-16 NOTE — Telephone Encounter (Signed)
Let's go ahead and give him 20mg  BID for one day before the injection and one day after the injection. Call him and make sure that he is fine with that.   [name], MD Allergy and [place] [facility] [place]

## 2019-08-17 ENCOUNTER — Encounter: Payer: Self-pay | Admitting: Allergy & Immunology

## 2019-08-17 MED ORDER — PREDNISONE 20 MG PO TABS: 20.0000 mg | ORAL_TABLET | Freq: Two times a day (BID) | ORAL | 0 refills | Status: AC

## 2019-08-17 NOTE — Telephone Encounter (Signed)
I called patient 2x with no answer. The first time someone picked up, said nothing then the line went dead. The second time it rang and went to VM. I was unable to leave a message.

## 2019-08-17 NOTE — Telephone Encounter (Signed)
I spoke with the patient to advise on the medication recommendation that Dr. Ernst Bowler has regarding the prednisone. I have sent that it to his desired pharmacy. He then asked me about the cetirizine and whether he should take this as instructed by Dr. Ernst Bowler along with the prednisone. I let him know that he should take any medications that he was instructed to. He then told me that he had picked up the levalbuterol nebulizer solution and when he used this he developed some burning in his throat and his stomach. I told him that if this happens with use of the levalbuterol then he should not use it. He told me that he had been taking nitroglycerin for chest pain for the past 2 days as well. He wanted to know if this would interfere with his injection that he is supposed to receive. I asked if he had spoken with his PCp to which he responded that he does not have one. He told me that he just goes to the hospital for any issues that he has. I told him that if he had been taking the nitroglycerin for the past couple of days due to chest pain then he needed to be evaluated. I asked him if the chest pain had radiated anywhere else in his upper body. He told me that he had some chest pain today that had went into his left shoulder and down his left arm. I told him that he should seek medical attention today. I let him know that he should be seen at the hospital and evaluated due to the chest pain. He told me that was not going to happen then hung up the phone.

## 2019-08-17 NOTE — Addendum Note (Signed)
Addended by: Lucrezia Starch I on: 08/17/2019 03:07 PM   Modules accepted: Orders

## 2019-08-19 NOTE — Telephone Encounter (Signed)
No answer. Unable to lvm.

## 2019-08-19 NOTE — Telephone Encounter (Signed)
So it sounds like a typical fruitful conversation with [name]. Let's call him back today to make sure he is doing well. I agree with the plan that you told him, especially with regards to the chest pain. He is certainly high risk for an MI with his history.   [name], MD Allergy and [place] [facility] [place]

## 2019-08-20 ENCOUNTER — Telehealth: Payer: Self-pay | Admitting: Allergy & Immunology

## 2019-08-20 ENCOUNTER — Ambulatory Visit: Payer: Self-pay

## 2019-08-20 NOTE — Telephone Encounter (Signed)
Pt called and wanted to talk with kayla about him stopping nucala and go on depixent. [phone].

## 2019-08-21 NOTE — Telephone Encounter (Signed)
Dr. Ernst Bowler this patient did not answer the phone when I tried to follow up on his chest pain/palpitations. Please advise on biologic.

## 2019-08-24 NOTE — Telephone Encounter (Signed)
Patient did not answer return call. He did call last week in regards to switching the biologic therapy and I forwarded this to Dr. Ernst Bowler.

## 2019-08-25 NOTE — Telephone Encounter (Signed)
Fine we can change to Dupixent.   [name], MD Allergy and [place] [facility] [place]

## 2019-08-25 NOTE — Telephone Encounter (Signed)
Patient is seeing a new pcp at [facility] on [address]. The provider is Apolonio Schneiders, MD. He is still  interested in receiving biologic treatment. He wants to know if you think that [place] would be a good alternative for him as he still have concerns with the Nucala. Per his instruction I did cancel the appointment scheduled for Thursday.

## 2019-08-26 ENCOUNTER — Other Ambulatory Visit: Payer: Self-pay | Admitting: *Deleted

## 2019-08-26 MED ORDER — ALBUTEROL SULFATE HFA 108 (90 BASE) MCG/ACT IN AERS: 2.0000 | INHALATION_SPRAY | Freq: Four times a day (QID) | RESPIRATORY_TRACT | 2 refills | Status: DC | PRN

## 2019-08-27 ENCOUNTER — Ambulatory Visit: Payer: Self-pay

## 2019-08-28 NOTE — Telephone Encounter (Signed)
Have started approval for Dupixent for patient. Will reach out to patient when ready to submit with instructions

## 2019-08-28 NOTE — Telephone Encounter (Signed)
L/m for patient advising approval and submit of Rx to [employer] and will need office visit in clinic to start therapy

## 2019-09-03 NOTE — Telephone Encounter (Signed)
Patient called restating that he received a letter of approval for [place] and would like to know when he can get the [place] shot. I talked to Tammy about next steps for the patient. Patient was informed that he would need to get the medication and then call the office to make an office visit with the doctor and he would get the [place] shot at the office visit. Patient verbalized understanding.

## 2019-09-04 NOTE — Telephone Encounter (Signed)
Patient called to inform the office that he will be picking up his [place] on 09/08/2019 from the [facility] on [address] and bringing it to the office for [name] to hold until his office visit on 09/15/2019.

## 2019-09-07 ENCOUNTER — Other Ambulatory Visit: Payer: Self-pay | Admitting: Allergy & Immunology

## 2019-09-09 ENCOUNTER — Other Ambulatory Visit: Payer: Self-pay | Admitting: *Deleted

## 2019-09-09 MED ORDER — EPINEPHRINE 0.3 MG/0.3ML IJ SOAJ: 0.3000 mg | Freq: Once | INTRAMUSCULAR | 2 refills | Status: AC

## 2019-09-15 ENCOUNTER — Ambulatory Visit: Payer: Medicare Other | Admitting: Allergy & Immunology

## 2019-10-06 ENCOUNTER — Other Ambulatory Visit: Payer: Self-pay

## 2019-10-06 MED ORDER — BROVANA 15 MCG/2ML IN NEBU: 15.0000 ug | INHALATION_SOLUTION | Freq: Two times a day (BID) | RESPIRATORY_TRACT | 1 refills | Status: DC

## 2019-10-06 MED ORDER — BUDESONIDE 0.5 MG/2ML IN SUSP: 0.5000 mg | Freq: Two times a day (BID) | RESPIRATORY_TRACT | 1 refills | Status: DC

## 2019-10-13 ENCOUNTER — Ambulatory Visit: Payer: Self-pay

## 2019-10-22 ENCOUNTER — Telehealth: Payer: Self-pay | Admitting: Allergy & Immunology

## 2019-10-22 MED ORDER — PREDNISONE 10 MG PO TABS: ORAL_TABLET | ORAL | 0 refills | Status: AC

## 2019-10-22 NOTE — Telephone Encounter (Signed)
Patient has had episodes of chest pressure/pain and shortness of breath at 2-3 am every morning for the past 3 weeks. He is taking all medications as prescribed for his breathing issues. He says that he doubled his prednsione at night when he had these episodes and did an additional breathing treatment. This seemed to help. He denies lightheadedness, dizziness,vomiting or diarrhea. He does complain of severe nausea for 3 weeks. He has had a cough that is only productive in the morning. He has had brownish colored sputum with bright red blood streak throughout. I educated patient on chest pain and when to seek medical attention. I also let him know not to just take random bursts of prednisone as this is not how it is prescribed to him.    I spoke with Dr. Ernst Bowler and per his instruction I am sending in a prednisone burst to be started on Friday.

## 2019-10-22 NOTE — Telephone Encounter (Signed)
Patient called stating that he has had trouble breathing in the last week. He is doing his regular treatments, using his oxygen, and doubling up on his prednisone. Patient would like to know what he should do. Should he be doubling up on his prednisone or should he increase his oxygen? Patient states that it gets worse at night. He is wheezing and sneezing very bad.  Please advise.

## 2019-10-22 NOTE — Telephone Encounter (Signed)
Returned call to patient. No answer. I did leave a message for him to call back before 500 today.

## 2019-10-26 ENCOUNTER — Ambulatory Visit: Payer: Self-pay

## 2019-11-16 ENCOUNTER — Telehealth: Payer: Self-pay

## 2019-11-16 NOTE — Telephone Encounter (Signed)
Referral notes sent from [facility] ,   Notes sent to scheduling

## 2019-11-18 ENCOUNTER — Other Ambulatory Visit: Payer: Self-pay | Admitting: Allergy & Immunology

## 2019-11-18 NOTE — Telephone Encounter (Signed)
He needs to start his Dupixent.   [name], MD Allergy and [place] [facility] [place]

## 2019-11-18 NOTE — Telephone Encounter (Signed)
Dr. Gallagher please advise.  

## 2019-11-18 NOTE — Telephone Encounter (Signed)
Patient called and needs to have some predisone called into [facility]  because it help the last time.[phone].

## 2019-11-19 NOTE — Telephone Encounter (Signed)
[name] is concerned about coming in to start the [place] due to the history of severe reactions he has had with the past biologics he has tried.

## 2019-11-19 NOTE — Telephone Encounter (Signed)
Yes I understand that, which is why he has to get his first dose here in the office. We can do other doses in the office as well. We can schedule him for a first visit of the morning and watch him all morning if he would like. He needs additional medications to help with his asthma and keep him from needing the prednisone so often.   I also want him to get the following labs: 1. Alpha gal panel 2. Stinging insect panel  If he is having such trouble breathing, he needs either an office visit with [name] or he needs to go to the ED for an evaluation.   [name], MD Allergy and [place] [facility] [place]

## 2019-11-23 NOTE — Telephone Encounter (Signed)
Called patient was sent straight to voicemail.

## 2019-11-25 NOTE — Telephone Encounter (Signed)
No answer again 

## 2019-11-26 NOTE — Telephone Encounter (Signed)
No return contact from patient. Will await call back.

## 2019-12-14 NOTE — Telephone Encounter (Signed)
Patient finally returned call. He informed me that he and his wife are seperating and he has changed his number. I have updated it accordingly in the demographics sections. I informed patient of what Dr. Ernst Bowler had said. He says that the prednisone is what helps him breathe and that he he takes it once in the morning and once in the evening. He told me that if/when he comes in he would be bringing a nurse with him because he has legal rights to do so and if we had to go to court then we would go to court. He says that he is begging [name] to refill it. I let him know that the nurse does not tell the doctor what is prescribed or how to prescribe medication because the ultimate decision is the doctors as they are the one who is licensed to do this. He then told me that sitting here for 4-5 hours would not do anything for him as far as a reaction goes because he does not react for 10-12 hours after receiving the injection. I told the patient that we would take care of him and that he has to come in for the injections. He told me that he know what the side effects are and he does not want those to happen. I let him know that I do understand where he is coming from.Patient ended call.

## 2019-12-15 NOTE — Telephone Encounter (Signed)
I am sorry to hear about he separation. We can send in prednisone 5mg  BID with three refills.   I am not sure where he would get the medication otherwise since he has fired so many other providers.   [name], MD Allergy and [place] [facility] [place]

## 2019-12-15 NOTE — Telephone Encounter (Signed)
Sister called in stating patient is in need of a refill for his prednisone.  He is unwilling to take the [place] shot recommended by Dr. Ernst Bowler.  She mentioned that his medication is due to run out on Friday and if we are unable to refill they will transfer his care elsewhere.  Please call [name] or Wanda(wife)

## 2019-12-17 ENCOUNTER — Other Ambulatory Visit: Payer: Self-pay

## 2019-12-17 MED ORDER — OMEPRAZOLE 40 MG PO CPDR: 40.0000 mg | DELAYED_RELEASE_CAPSULE | Freq: Every day | ORAL | 0 refills | Status: DC

## 2019-12-17 MED ORDER — PREDNISONE 5 MG PO TABS: 5.0000 mg | ORAL_TABLET | Freq: Two times a day (BID) | ORAL | 3 refills | Status: DC

## 2019-12-17 NOTE — Telephone Encounter (Signed)
Refill has been sent as instructed. Do you want me to inform patient that he should come in for a follow up when I call him to tell him?

## 2019-12-17 NOTE — Telephone Encounter (Signed)
Yes I agree - he needs to be seen every three months.   [name], MD Allergy and [place] of [place]

## 2019-12-17 NOTE — Addendum Note (Signed)
Addended by: Herbie Drape on: 12/17/2019 09:24 AM   Modules accepted: Orders

## 2019-12-17 NOTE — Addendum Note (Signed)
Addended by: Lucrezia Starch I on: 12/17/2019 08:31 AM   Modules accepted: Orders

## 2019-12-18 NOTE — Telephone Encounter (Signed)
Left message regarding refills and requested a call back to discuss need for every 3 month follow ups.

## 2019-12-18 NOTE — Telephone Encounter (Signed)
Patient informed of need for appointments. He has been scheduled for 12-31-2019 at 400. I did schedule him for a 30 minute slot so that you could have extra time discuss things with him. He told me that he had tried to wean off of the prednisone but once got to one of the 5 mg tablets daily he started to have chest pains. He took a nitroglycerin tablet for this and it did seem to help. His primary care physician referred him to cardiology for a stress test. I recommended that he keep the scheduled appointments and do as instructed by cardiology as far as the chest pains go. Patient verbalized understanding of this.

## 2019-12-18 NOTE — Telephone Encounter (Signed)
FYI

## 2019-12-25 ENCOUNTER — Other Ambulatory Visit: Payer: Self-pay | Admitting: Allergy & Immunology

## 2019-12-25 DIAGNOSIS — J449 Chronic obstructive pulmonary disease, unspecified: Secondary | ICD-10-CM

## 2019-12-30 ENCOUNTER — Other Ambulatory Visit: Payer: Self-pay

## 2019-12-30 DIAGNOSIS — J449 Chronic obstructive pulmonary disease, unspecified: Secondary | ICD-10-CM

## 2019-12-31 ENCOUNTER — Ambulatory Visit: Payer: Self-pay | Admitting: Allergy & Immunology

## 2020-01-01 LAB — ALLERGEN STINGING INSECT PANEL
Honeybee IgE: 2.53 kU/L — AB
Hornet, White Face, IgE: 12.6 kU/L — AB
Hornet, Yellow, IgE: 2.68 kU/L — AB
Paper Wasp IgE: 19.6 kU/L — AB
Yellow Jacket, IgE: 15.3 kU/L — AB

## 2020-01-05 ENCOUNTER — Telehealth: Payer: Self-pay

## 2020-01-05 ENCOUNTER — Ambulatory Visit: Payer: Self-pay | Admitting: Allergy & Immunology

## 2020-01-05 NOTE — Telephone Encounter (Signed)
Patient had to cancel his appointment today due to insurance coverage issues. He has contacted his insurance company and they are in the process of authorizing his visits with our office. Patient is complaining of excessive mucus production and chest tightness. He says that he is taking all medication as prescribed. Please advise on some other instructions for him and thank you.

## 2020-01-06 NOTE — Telephone Encounter (Signed)
I would add on Mucinex 1200 mg twice daily and make sure that he is drinking plenty of water.   [name], MD Allergy and [place] [facility] [place]

## 2020-01-06 NOTE — Telephone Encounter (Signed)
Called patient and advised. Patient verbalized understanding.  

## 2020-01-18 ENCOUNTER — Ambulatory Visit: Payer: Medicare Other | Admitting: Allergy & Immunology

## 2020-01-18 ENCOUNTER — Encounter: Payer: Self-pay | Admitting: Allergy & Immunology

## 2020-01-18 ENCOUNTER — Other Ambulatory Visit: Payer: Self-pay

## 2020-01-18 VITALS — BP 132/80 | HR 74 | Temp 98.5°F | Resp 18 | Ht 72.0 in | Wt 234.0 lb

## 2020-01-18 DIAGNOSIS — J3089 Other allergic rhinitis: Secondary | ICD-10-CM | POA: Diagnosis not present

## 2020-01-18 DIAGNOSIS — J302 Other seasonal allergic rhinitis: Secondary | ICD-10-CM

## 2020-01-18 DIAGNOSIS — T63441D Toxic effect of venom of bees, accidental (unintentional), subsequent encounter: Secondary | ICD-10-CM

## 2020-01-18 DIAGNOSIS — J449 Chronic obstructive pulmonary disease, unspecified: Secondary | ICD-10-CM | POA: Diagnosis not present

## 2020-01-18 DIAGNOSIS — K219 Gastro-esophageal reflux disease without esophagitis: Secondary | ICD-10-CM | POA: Diagnosis not present

## 2020-01-18 MED ORDER — PANTOPRAZOLE SODIUM 40 MG PO TBEC: 40.0000 mg | DELAYED_RELEASE_TABLET | Freq: Every day | ORAL | 5 refills | Status: DC

## 2020-01-18 MED ORDER — LORATADINE 10 MG PO TABS: 10.0000 mg | ORAL_TABLET | Freq: Every day | ORAL | 5 refills | Status: DC | PRN

## 2020-01-18 MED ORDER — BUDESONIDE 0.5 MG/2ML IN SUSP: 0.5000 mg | Freq: Two times a day (BID) | RESPIRATORY_TRACT | 1 refills | Status: DC

## 2020-01-18 MED ORDER — YUPELRI 175 MCG/3ML IN SOLN: 175.0000 ug | Freq: Every day | RESPIRATORY_TRACT | 1 refills | Status: DC

## 2020-01-18 MED ORDER — ALBUTEROL SULFATE HFA 108 (90 BASE) MCG/ACT IN AERS: 2.0000 | INHALATION_SPRAY | Freq: Four times a day (QID) | RESPIRATORY_TRACT | 2 refills | Status: DC | PRN

## 2020-01-18 MED ORDER — BROVANA 15 MCG/2ML IN NEBU: 15.0000 ug | INHALATION_SOLUTION | Freq: Two times a day (BID) | RESPIRATORY_TRACT | 1 refills | Status: DC

## 2020-01-18 NOTE — Patient Instructions (Addendum)
1. Severe persistent asthma with COPD overlap - [name] looked stable.  - We are not going to make any changes today.  - Daily controller medication(s): Pulmicort 0.5mg  (inhaled steroid) TWO TIMES DAILY + Brovana 48mcg TWO TIMES DAILY + Yupelri ONE TIME DAILY + montelukast 10 mg ONE TIME DAILY + prednisone 5mg  TWO TIMES DAILY - Prior to physical activity: ProAir 2 puffs 10-15 minutes before physical activity. - Rescue medications: albuterol nebulizer one vial every 4-6 hours as needed or DuoNeb nebulizer one vial every 4-6 hours as needed - Asthma control goals:  * Full participation in all desired activities (may need albuterol before activity) * Albuterol use two time or less a week on average (not counting use with activity) * Cough interfering with sleep two time or less a month * Oral steroids no more than once a year * No hospitalizations  2. Perennial and seasonal allergic rhinitis - Continue with loratadine 10mg  daily as needed. - Continue with fluticasone 1-2 sprays per nostril daily as needed.   3. GERD - Stop the omeprazole 40mg  daily. - Start pantoprazole 40mg  daily twice daily for now.   4. Return in about 4 months (around 05/19/2020). This can be an in-person, a virtual Webex or a telephone follow up visit.   Please inform us of any Emergency Department visits, hospitalizations, or changes in symptoms. Call us before going to the ED for breathing or allergy symptoms since we might be able to fit you in for a sick visit. Feel free to contact us anytime with any questions, problems, or concerns.  It was a pleasure to see you again today!  Websites that have reliable patient information: 1. American Academy of Asthma, Allergy, and Immunology: www.aaaai.org 2. Food Allergy Research and Education (FARE): foodallergy.org 3. Mothers of Asthmatics: http://www.asthmacommunitynetwork.org 4. American College of Allergy, Asthma, and Immunology: www.acaai.org   COVID-19 Vaccine  Information can be found at: ShippingScam.co.uk For questions related to vaccine distribution or appointments, please email vaccine@[email or web address] or call [phone].     "Like" [name] on Facebook and Instagram for our latest updates!       HAPPY SPRING!  Make sure you are registered to vote! If you have moved or changed any of your contact information, you will need to get this updated before voting!  In some cases, you MAY be able to register to vote online: [email or web address]

## 2020-01-18 NOTE — Progress Notes (Signed)
FOLLOW UP  Date of Service/Encounter:  01/18/20   Assessment:   Severe persistent asthmawith COPD overlap  Systemic reaction to Fasenra  Systemic reaction versus panic attack to Nucala   Chronic prednisone use (5+ years) - currently on 10mg  daily(with intermittent self-induced bursts up to 20mg  daily)  Seasonal and perennial allergic rhinitis(grasses, weeds, trees, cat, dog, dust mite, and cockroach)  History of non-compliance  Pulmonary nodules (stable per last chest CT June 2019)  Complex medical history, including bipolar depression  GERD - on omeprazole with continued marked use of Tums (8-10 per day)  OSA - with history of non-compliance with CPAP    [name] is surprisingly kept his follow-up appointment today.  He seems to be doing fairly well all things considered.  He remains on prednisone twice daily as well as all of his inhaled medications.  He evidently found some out of date DuoNeb nebulizer solution which works "like a Nutritional therapist".  We are going to continue with this.  We did discuss starting Dupixent, but he is clearly very worried about this.  I offered to have him in pursing in the morning and let him stay all day to make sure he tolerates it okay, but he has not very excited about this prospect.  I doubt we will ever get him on a biologic to wean him off of his prednisone.  We are going to increase his reflux medication to Protonix from Prevacid since he is still using Tums 8-10 times per day.  Plan/Recommendations:   1. Severe persistent asthma with COPD overlap - [name] looked stable.  - We are not going to make any changes today.  - Daily controller medication(s): Pulmicort 0.5mg  (inhaled steroid) TWO TIMES DAILY + Brovana 34mcg TWO TIMES DAILY + Yupelri ONE TIME DAILY + montelukast 10 mg ONE TIME DAILY + prednisone 5mg  TWO TIMES DAILY - Prior to physical activity: ProAir 2 puffs 10-15 minutes before physical activity. - Rescue medications:  albuterol nebulizer one vial every 4-6 hours as needed or DuoNeb nebulizer one vial every 4-6 hours as needed - Asthma control goals:  * Full participation in all desired activities (may need albuterol before activity) * Albuterol use two time or less a week on average (not counting use with activity) * Cough interfering with sleep two time or less a month * Oral steroids no more than once a year * No hospitalizations  2. Perennial and seasonal allergic rhinitis - Continue with loratadine 10mg  daily as needed. - Continue with fluticasone 1-2 sprays per nostril daily as needed.   3. GERD - Stop the omeprazole 40mg  daily. - Start pantoprazole 40mg  daily twice daily for now.   4. Return in about 4 months (around 05/19/2020). This can be an in-person, a virtual Webex or a telephone follow up visit.  Subjective:   [name] is a 69 y.o. male presenting today for follow up of  Chief Complaint  Patient presents with  . Asthma    his asthma is worsening despite using his medications as prescribed. he is very concerned about having to start any kind of biologic therapy due to risk of reactions. he wakes up every night unable to breath and has to take a nebulizer treatment.   Marland Kitchen COPD    [name] has a history of the following: Patient Active Problem List   Diagnosis Date Noted  . COPD with acute exacerbation ([place]) 12/04/2018  . PSVT (paroxysmal supraventricular tachycardia) ([place]) 09/23/2018  . Seasonal  and perennial allergic rhinitis 07/30/2018  . Asthma-COPD overlap syndrome ([place]) 07/07/2018  . S/P arthroscopy of right shoulder 04/25/2017  . Generalized anxiety disorder 04/22/2017  . Severe persistent asthma without complication 99991111  . Bee sting-induced anaphylaxis 02/12/2017  . Anaphylactic reaction due to food, initial encounter 02/12/2017  . Current use of beta blocker 02/12/2017  . Allergic rhinitis 01/15/2017  . COPD exacerbation ([place]) 11/04/2016  . Asthma  exacerbation 11/03/2016  . Nausea without vomiting 09/17/2016  . Shortness of breath   . Diabetes mellitus type 2 in obese ([place]) 06/26/2016  . non-specific Chest pain 06/12/2016  . Patient's noncompliance with other medical treatment and regimen 06/02/2016  . Hyperlipidemia, mixed 06/02/2016  . Elevated liver enzymes  06/02/2016  . Schizophrenia ([place]) 04/30/2016  . Environmental and seasonal allergies 04/30/2016  . Diabetes mellitus without complication- (diet controlled; w/o proteinuria) 04/30/2016  . Heavy alcohol use- Many yrs  04/30/2016  . Basal cell carcinoma of back 04/30/2016  . Obesity 04/30/2016  . Hx of multiple concussions 04/30/2016  . h/o Vitamin D deficiency 04/30/2016  . Screen for sexually transmitted diseases 04/30/2016  . Immunocompromised state ([place]) 04/30/2016  . Cervical radiculopathy 01/15/2016  . Alcohol withdrawal ([place]) 11/22/2015  . Alcohol abuse with alcohol-induced mood disorder ([place]) 11/22/2015  . h/o Suicidal thoughts 11/21/2015  . Dry mouth 10/07/2015  . Anxiety 09/19/2015  . OAB (overactive bladder) 07/27/2015  . Chronic idiopathic constipation 05/08/2015  . Screening for colon cancer   . Benign neoplasm of cecum   . Benign neoplasm of ascending colon   . Benign neoplasm of transverse colon   . Benign neoplasm of sigmoid colon   . Rectal polyp   . Gastroesophageal reflux disease without esophagitis   . Esophageal stricture   . Joint stiffness of multiple sites 10/17/2014  . Benign localized hyperplasia of prostate with urinary obstruction 09/29/2014  . concerns for memory loss 09/13/2014  . Tuberculosis   . Diverticulum of bladder 06/17/2014  . Acute urinary retention 05/31/2014  . Neuropathy ([place]) 04/11/2014  . Post-polio syndrome 01/13/2014  . Other emphysema ([place]) 01/13/2014  . Hypertension 01/13/2014  . Former smoker 01/13/2014  . h/o Prostate cancer 01/13/2014  . History of tuberculosis 01/13/2014  . Bipolar disorder ([place]) 01/13/2014   . Explosive personality disorder ([place]) 01/13/2014  . Erectile dysfunction 01/13/2014  . Nephrolithiasis 01/13/2014  . Chronic pain 01/13/2014  . Obstructive sleep apnea 01/13/2014  . Alcoholic gastritis 99991111  . Nocturnal hypoxia 08/10/2013    History obtained from: chart review and patient.  [name] is a 69 y.o. male presenting for a follow up visit.  He is well-known to the practice.  He has a history of severe persistent asthma with COPD overlap as well as oxygen dependence.  He also has a history of seasonal and perennial allergic rhinitis.  He was last seen in October 2020.  At that time, he was started on a prednisone taper and continued on his nebulized medications including Pulmicort, Brovana, Yupelri, and montelukast as well as his baseline prednisone.  He was also encouraged to use his oxygen at night.  For his allergic rhinitis, he was continued on Claritin as well as Flonase.  He was also continued with omeprazole 40 mg daily.  He also has a history of anaphylaxis to stinging insects and had a panel drawn around 1 month ago that confirmed this.  Since last visit, he has mostly done well.  He is quite gruff as is typical for him.  He  does not mention his wife much, but we do know from previous discussions that he is in the middle of a divorce.  Asthma/Respiratory Symptom History: He remains on all of his nebulized medications.  He also found some added a DuoNeb which seems to help quite a bit.  He endorses shortness of breath with exertion as well as tightness in his chest and a cough productive of clear to yellow mucus.  This is baseline for him.  He is followed by pulmonologist, I cannot remember his name.  He has a history of firing his pulmonologist so it is hard to keep up who follows him.  He remains on oxygen 2 to 3 L/min every night.  He does not use it during the day.  Allergic Rhinitis Symptom History: He remains on his Claritin and Flonase.  He has sneezing around dust  and dogs.  He also endorses some postnasal drip.  He has not required antibiotics since we saw him.  He does not think he is increased his prednisone since the last time.  GERD Symptom History: He remains on Prevacid 40 mg daily.  He says this works better than his Nexium.  However, he tells [name] that he still uses his Tums 8-10 times per day.  Stinging Insect Symptom History: He has avoided stings.  He does have an up-to-date EpiPen.  He was positive to the entire panel in March 2021.   Otherwise, there have been no changes to his past medical history, surgical history, family history, or social history.    ROS     Objective:   Blood pressure 132/80, pulse 74, temperature 98.5 F (36.9 C), temperature source Temporal, resp. rate 18, height 6' (1.829 m), weight 234 lb (106.1 kg), SpO2 96 %. Body mass index is 31.74 kg/m.   Physical Exam  Constitutional: He appears well-developed.  Boisterous as always. Very talkative.  HENT:  Head: Normocephalic and atraumatic.  Right Ear: Tympanic membrane, external ear and ear canal normal.  Left Ear: Tympanic membrane, external ear and ear canal normal.  Nose: Mucosal edema and rhinorrhea present. No nasal deformity or septal deviation. No epistaxis. Right sinus exhibits no maxillary sinus tenderness and no frontal sinus tenderness. Left sinus exhibits no maxillary sinus tenderness and no frontal sinus tenderness.  Mouth/Throat: Uvula is midline and oropharynx is clear and moist. Mucous membranes are not pale and not dry.  Cobblestoning present in the posterior oropharynx. Tonsils normal sized bilaterally without exudates.  Eyes: Pupils are equal, round, and reactive to light. Conjunctivae and EOM are normal. Right eye exhibits no chemosis and no discharge. Left eye exhibits no chemosis and no discharge. Right conjunctiva is not injected. Left conjunctiva is not injected.  Cardiovascular: Normal rate, regular rhythm and normal heart sounds.    Respiratory: Effort normal and breath sounds normal. No accessory muscle usage. No tachypnea. No respiratory distress. He has no wheezes. He has no rhonchi. He has no rales. He exhibits no tenderness.  Not moving air well at the bases, which is his baseline.  No crackles noted.  He does have scattered expiratory wheezes, which is his baseline.   Lymphadenopathy:    He has no cervical adenopathy.  Neurological: He is alert.  Skin: No abrasion, no petechiae and no rash noted. Rash is not papular, not vesicular and not urticarial. No erythema. No pallor.  No eczematous or urticarial lesions noted.   Psychiatric: He has a normal mood and affect  Diagnostic studies:    Spirometry: results  abnormal (FEV1: 1.43/44%, FVC: 2.21/50%, FEV1/FVC: 65%).    Spirometry consistent with mixed obstructive and restrictive disease.   Allergy Studies: none        [name], MD  [facility] [place] [facility] [place]

## 2020-01-19 ENCOUNTER — Telehealth: Payer: Self-pay

## 2020-01-19 ENCOUNTER — Encounter: Payer: Self-pay | Admitting: Allergy & Immunology

## 2020-01-19 NOTE — Telephone Encounter (Signed)
Honestly, I do not feel comfortable with him showing up for the injection on a day I am not here. I do not want him to react with someone other than me here since he can be a bit high maintenance. Please have him reschedule. I would like him to wait two hours at least. Maybe schedule for a Tuesday afternoon when we have more rooms available.   See what we can do about the drug costs. I am not sure what we can do for him at this point, especially since he has said not to send to [facility]. Is that [facility] in [place] an option?   [name], MD Allergy and [place] [facility] [place]

## 2020-01-19 NOTE — Telephone Encounter (Signed)
Patient called because he has received notification that the pharmacy/insurance is going up on his cost for the Brovana and Pulmicort nebulizer solution. He is also requesting to start the Dupixent injections. I did get him to agree to wait 30-60 minutes. He is scheduled for 01-28-20. I offered to send the prescriptions to [facility] but he told  Me that he owes them a lot of money so not to send it there.

## 2020-01-21 MED ORDER — BROVANA 15 MCG/2ML IN NEBU: 15.0000 ug | INHALATION_SOLUTION | Freq: Two times a day (BID) | RESPIRATORY_TRACT | 1 refills | Status: DC

## 2020-01-21 MED ORDER — IPRATROPIUM-ALBUTEROL 0.5-2.5 (3) MG/3ML IN SOLN: 3.0000 mL | Freq: Every evening | RESPIRATORY_TRACT | 1 refills | Status: DC

## 2020-01-21 MED ORDER — BUDESONIDE 0.5 MG/2ML IN SUSP: 0.5000 mg | Freq: Two times a day (BID) | RESPIRATORY_TRACT | 1 refills | Status: DC

## 2020-01-21 NOTE — Telephone Encounter (Signed)
FYI

## 2020-01-21 NOTE — Telephone Encounter (Signed)
Patient spoke with insurance and they are working to get things switched back around. In the meantime I am going to send his other nebulized medications to [facility], which he is okay with. I have rescheduled him to a Tuesday early in the morning when you will be here.

## 2020-01-21 NOTE — Addendum Note (Signed)
Addended by: Lucrezia Starch I on: 01/21/2020 09:51 AM   Modules accepted: Orders

## 2020-01-28 ENCOUNTER — Ambulatory Visit: Payer: Self-pay

## 2020-01-28 ENCOUNTER — Telehealth: Payer: Self-pay

## 2020-01-28 NOTE — Telephone Encounter (Signed)
Patient called with complaints of chest pain and increased blood pressure when taking the montelukast. I told him to stop taking it and to follow up with his PCP or call us back if the chest pain returned or worsened. Patient verbalized understanding.

## 2020-02-02 NOTE — Telephone Encounter (Signed)
Agree with the plan.  If he continues to have chest pain and elevated blood pressure, he certainly needs to either go to the emergency room or see his primary care provider.   [name], MD Allergy and [place] [facility] [place]

## 2020-02-04 NOTE — Telephone Encounter (Signed)
Dr. Ernst Bowler I have sent the medication to [facility] and I am not sure what else to do to get the cost lowered.

## 2020-02-04 NOTE — Telephone Encounter (Signed)
Patient called wanting to talk to [facility], informed patient she was with a patient. Patient states he has went around and around with Brovana and the cost. Patient would like to speak with someone before he comes in next week for his injections maybe to switch medication.  Please advise.

## 2020-02-05 NOTE — Telephone Encounter (Signed)
We could just ditch the [place] since that is the expensive one.   [name], MD Allergy and [place] [facility] [place]

## 2020-02-08 NOTE — Telephone Encounter (Signed)
I spoke with patient. He has to use [facility] for all prescription per insurance requirements. He also told me that he has been having to double up on his medications to be able to breath. He is scheduled to come in for his injection tomorrow and will wait 2 hours post injection. Any further recommendations?

## 2020-02-09 ENCOUNTER — Ambulatory Visit: Payer: Self-pay

## 2020-02-09 NOTE — Telephone Encounter (Signed)
I honestly do not know what to do for his alcohol withdrawal symptoms.  I will talk to his primary care provider for that.  He can do his DuoNeb treatments every 4 hours, but I bet a lot of this is from his alcohol withdrawal.  I have absolutely no experience of treating that.  [name], MD Allergy and [place] [facility] [place]

## 2020-02-09 NOTE — Telephone Encounter (Signed)
Per verbal conversation with Dr Ernst Bowler I informed patient of the following: Take prednisone 10 mg twice daily for 7 days. Start 2 days prior to coming in to start the Dupixent injections and continue for 5 days following the injection. Take cetirizine 10 mg twice daily with the same instructions. Patient will start this regimen on Monday 02-15-20 a she is scheduled for his Dupixent start appointment 02-18-2020. Patient has verbalized understanding of this information and will do as instructed.  He does report worsening breathing symptoms along with alcoholic withdrawal symptoms. He is having nausea, difficulty urinating and he is unsure of what to do. He told me that he is having a stressful time at the moment because he and his wife are facing eviction if they do not get their home moved within the next couple of weeks. Patient would like to know what to do until he starts the prednisone and cetirizine regimen. Please advise on further instructions if any as he has already doubled up on his medications.

## 2020-02-10 NOTE — Telephone Encounter (Signed)
Called patient. No answer. Unable to leave message.

## 2020-02-11 NOTE — Telephone Encounter (Signed)
Yes documented on other note.

## 2020-02-11 NOTE — Telephone Encounter (Signed)
Did we already come up with a plan for this?  He is coming in next week for the injection right?  [name], MD Allergy and [place] [facility] [place]

## 2020-02-12 NOTE — Telephone Encounter (Signed)
Called patient again. Unable to leave a message.

## 2020-02-15 ENCOUNTER — Other Ambulatory Visit: Payer: Self-pay

## 2020-02-15 MED ORDER — PREDNISONE 10 MG PO TABS: 10.0000 mg | ORAL_TABLET | Freq: Two times a day (BID) | ORAL | 0 refills | Status: AC

## 2020-02-15 NOTE — Progress Notes (Signed)
Per telephone encounter on 01/28/2020 Dr. Ernst Bowler wanted patient to start prednisone 2 days prior to coming in for his Dupixent and 5 days after his injection. Script sent in to requested pharmacy.

## 2020-02-17 NOTE — Telephone Encounter (Signed)
Did you by chance inform him of this when you talked with him?

## 2020-02-18 ENCOUNTER — Ambulatory Visit (INDEPENDENT_AMBULATORY_CARE_PROVIDER_SITE_OTHER): Payer: Medicare Other

## 2020-02-18 ENCOUNTER — Other Ambulatory Visit: Payer: Self-pay

## 2020-02-18 VITALS — BP 128/76 | HR 78 | Temp 97.8°F

## 2020-02-18 DIAGNOSIS — J4550 Severe persistent asthma, uncomplicated: Secondary | ICD-10-CM

## 2020-02-18 DIAGNOSIS — J449 Chronic obstructive pulmonary disease, unspecified: Secondary | ICD-10-CM

## 2020-02-18 MED ORDER — DUPILUMAB 300 MG/2ML ~~LOC~~ SOSY
600.0000 mg | PREFILLED_SYRINGE | Freq: Once | SUBCUTANEOUS | Status: AC
  Administered 2020-02-18: 600 mg via SUBCUTANEOUS

## 2020-02-18 NOTE — Telephone Encounter (Signed)
I did speak with him on 02/15/2020 and inform him of this information. He verbalized understanding.

## 2020-02-18 NOTE — Progress Notes (Signed)
Immunotherapy   Patient Details  Name: [name] MRN: [id number] Date of Birth: [birth year]  02/18/2020  [name] started injections for  Dupixent 600 mg was given to patient today with 300 mg given in the the right arm at 8:50. Per Dr. Ernst Bowler we needed to administer injections 30 mins in between to minimize reaction. Second dose was give 9:30 am to patient. Patient was instructed to wait 30 mins after each injection. Patient tolerated injection well with out any complaints of any sort of discomfort or pain.  Frequency: 2 weeks Epi-Pen:Epi-Pen Available  Consent signed and patient instructions given.   Festus Holts [name] 02/18/2020, 8:45 AM

## 2020-02-19 ENCOUNTER — Telehealth: Payer: Self-pay | Admitting: Allergy & Immunology

## 2020-02-19 ENCOUNTER — Telehealth: Payer: Self-pay

## 2020-02-19 MED ORDER — BUDESONIDE-FORMOTEROL FUMARATE 160-4.5 MCG/ACT IN AERO: 2.0000 | INHALATION_SPRAY | Freq: Two times a day (BID) | RESPIRATORY_TRACT | 5 refills | Status: DC

## 2020-02-19 NOTE — Telephone Encounter (Signed)
[name] D/C his Dupixent

## 2020-02-19 NOTE — Telephone Encounter (Signed)
I am sorry to hear this! He was informed to discontinue the Brovana per your instructions due to the cost. I hope that his symptoms improved.

## 2020-02-19 NOTE — Telephone Encounter (Signed)
Awesome! Thank you so very much! I appreciate all that you do!

## 2020-02-19 NOTE — Telephone Encounter (Signed)
Patient called back and stated that he went to walk his dogs and noticed that he began to feel more light headed and had to use a tree to support himself and that the pressure in his chest was becoming worse. He asked if I recommended he use his nitroglycerine. I advised that I'm not sure if that was related to the problem he was having but if he felt better taking that medication than he should go ahead and do so. I advised for the patient to not walk his dogs or do anything else strenuous today and to go seek medical evaluation at the Emergency Room just to make sure everything is ok. Patient verbalized understanding and stated that he is going to [facility] to be evaluated.

## 2020-02-19 NOTE — Telephone Encounter (Signed)
Fax from [employer].  Symbicort not covered by the insurance.  Pt has tried and failed Breo Ellipta:  01/15/17, unable to tolerate.  Also has failed Trelegy, had SOB 07/30/18.   PA submitted through cover my meds and has been approved.  Fax to pharmacy to make them aware of approval.   Approved today [id number]; Status: Approved; Review Type:  Prior Auth; Coverage Start Date:01/20/2020 Coverage End Date:02/18/2021;

## 2020-02-19 NOTE — Telephone Encounter (Signed)
Unsurprisingly, [name] "reacted" to the [name]. His wife reports that he developed heaviness of his chest last night and has been using his albuterol more frequently overnight. He has had no fever but is having increased mucous production. She denies hives, swelling, or GI symptoms. He is talkative in the background. His wife is requesting a refill on his Symbicort since this seems to help him. It is unclear why he is not on the nebulized controller medication like he was when I saw him in early April, although he apparently had some trouble affording them at some point.    Confirmed pharmacy. I did offer an appt today and recommended a trip to the ED if things worsened, but his wife says that he did not want to get out of bed.   Sent in Symbicort. I will route to [facility] to keep her in the loop. I doubt that the [name] is at fault, but I am certain that [name] will not take any more of it. This tends to be his trend where biologics are concerned. t  [name], MD Allergy and [place] [facility] [place]

## 2020-02-22 NOTE — Telephone Encounter (Signed)
Noted.   [name], MD Allergy and [place] [facility] [place]

## 2020-02-23 ENCOUNTER — Telehealth: Payer: Self-pay

## 2020-02-23 ENCOUNTER — Telehealth: Payer: Self-pay | Admitting: Allergy & Immunology

## 2020-02-23 MED ORDER — PREDNISONE 5 MG PO TABS: 5.0000 mg | ORAL_TABLET | Freq: Two times a day (BID) | ORAL | 3 refills | Status: DC

## 2020-02-23 NOTE — Telephone Encounter (Signed)
Prednisone has been sent in. Called patient to confirm pharmacy and he stated about getting his next Dupixent injection. I advised that this was discontinued since he had the reaction last week. He states that he does not think it was a reaction that it was just chest pain and that when he took 20mg  of prednisone if went away within minutes, he stated that he would like to continue on the [place]. I advised to the patient that I would send this message for you to review and see what you advise. Patient verbalized understanding.

## 2020-02-23 NOTE — Telephone Encounter (Signed)
We can refill it - 5 mg BID. This is his maintenance dose.   If he is feeling better there is no need for a burst at this point.   [name], MD Allergy and [place] [facility] [place]

## 2020-02-23 NOTE — Telephone Encounter (Signed)
Just let me know what you decide.  His approval has expired and I will have to try and get another approval.  Unfortunately, he waited so long to start I can't do one for continuing therapy since he has only had one dose.  I will have to get approved as new start again.

## 2020-02-23 NOTE — Telephone Encounter (Signed)
Patient wife called and would like to talk with you about [place] getting dupixent shot again. He thinks he will be alright. [phone].

## 2020-02-23 NOTE — Telephone Encounter (Signed)
Please see other message sent as well.

## 2020-02-23 NOTE — Addendum Note (Signed)
Addended by: Chip Boer R on: 02/23/2020 03:06 PM   Modules accepted: Orders

## 2020-02-23 NOTE — Telephone Encounter (Signed)
Patient called to inform you that the Prednisone that you prescribed for him to take (5mg  BID) back in March has run out. He would like to know if you would like him to continue taking this or if you would like him to begin a taper of some sort. Please advise.

## 2020-02-24 NOTE — Telephone Encounter (Signed)
I left a message with the nurse at the cardiology office requesting a call back. I do see where the stress test was ordered but I do not see results.

## 2020-02-24 NOTE — Telephone Encounter (Signed)
They really need to get on the same page.  I forwarded the message to [place].  We are going to work on getting the [place] approved again.  Can you call his wife to let them know?  [name], MD Allergy and [place] [facility] [place]

## 2020-02-24 NOTE — Telephone Encounter (Signed)
This chest pain that resolves within minutes of prednisone is very concerning for cardiac etiology.  I did review his cardiology note from February 2021 and he had normal sinus rhythm with no ischemic changes at that time.  It looks like a stress test was ordered at that time.  It is not clear that this is been done.  Can we reach out to him to make sure that he had the stress test?  Regarding the Dupixent, I never felt that it was an allergic reaction to that.  I just assumed that he would not want to continue since his wife seem to attribute the chest tightness to the Dupixent injection.  I am fine with giving it another trial.  Can we work on getting this approved again?  We can order another CBC if needed, but maybe we can get it approved on a steroid sparing indication since he has been on chronic steroids for years.  Jonathon Marvel, MD Allergy and [place] [facility] [place]

## 2020-02-24 NOTE — Telephone Encounter (Signed)
Called and spoke with the patient's wife and relayed the message. She verbalized understanding and think that [name] will benefit better from the one injection instead of two.

## 2020-02-25 NOTE — Telephone Encounter (Signed)
This is quite frustrating.  [name], MD Allergy and [place] [facility] [place]

## 2020-02-25 NOTE — Telephone Encounter (Signed)
I spoke with Darlene at Cardiology office. Patient cancelled stress test.

## 2020-02-26 NOTE — Telephone Encounter (Signed)
Yes that is a good message to give the patient. Thank you, Lonn [place]!   [name], MD Allergy and [place] [facility] [place]

## 2020-02-26 NOTE — Telephone Encounter (Signed)
We could pass along to the patient that it is in his best interest to have this test performed to further evaluate this unexplained chest pain/tightness?

## 2020-02-29 NOTE — Telephone Encounter (Signed)
I spoke with [name]. He is in the process of moving at this time. I explained to him that he needs to contact the cardiology office and I even gave him the contact information for their office. Patient verbalized understanding of need to cal their office and will do so.

## 2020-03-03 ENCOUNTER — Ambulatory Visit
Admission: EM | Admit: 2020-03-03 | Discharge: 2020-03-03 | Disposition: A | Discharge status: Home / Self Care | Payer: Medicare Other | Attending: Physician Assistant | Admitting: Physician Assistant

## 2020-03-03 ENCOUNTER — Ambulatory Visit: Payer: Self-pay

## 2020-03-03 DIAGNOSIS — R109 Unspecified abdominal pain: Secondary | ICD-10-CM | POA: Diagnosis not present

## 2020-03-03 DIAGNOSIS — R300 Dysuria: Secondary | ICD-10-CM

## 2020-03-03 LAB — POCT URINALYSIS DIP (MANUAL ENTRY)
Bilirubin, UA: NEGATIVE
Blood, UA: NEGATIVE
Glucose, UA: NEGATIVE mg/dL
Leukocytes, UA: NEGATIVE
Nitrite, UA: NEGATIVE
Protein Ur, POC: NEGATIVE mg/dL
Spec Grav, UA: 1.02 (ref 1.010–1.025)
Urobilinogen, UA: 0.2 E.U./dL
pH, UA: 5.5 (ref 5.0–8.0)

## 2020-03-03 MED ORDER — ONDANSETRON 4 MG PO TBDP
4.0000 mg | ORAL_TABLET | Freq: Three times a day (TID) | ORAL | 0 refills | Status: DC | PRN
Start: 2020-03-03 — End: 2020-05-06

## 2020-03-03 MED ORDER — ONDANSETRON 4 MG PO TBDP: 4.0000 mg | ORAL_TABLET | Freq: Once | ORAL | Status: DC

## 2020-03-03 MED ORDER — TAMSULOSIN HCL 0.4 MG PO CAPS: 0.4000 mg | ORAL_CAPSULE | Freq: Every day | ORAL | 0 refills | Status: DC

## 2020-03-03 MED ORDER — ONDANSETRON 4 MG PO TBDP
4.0000 mg | ORAL_TABLET | Freq: Once | ORAL | Status: AC
  Administered 2020-03-03: 4 mg via ORAL

## 2020-03-03 NOTE — ED Provider Notes (Signed)
[name]    CSN: [id number] Arrival date & time: 03/03/20  2671      History   Chief Complaint Chief Complaint  Patient presents with  . Flank Pain    HPI [name] is a 69 y.o. male.   69 year old male comes in for dysuria, urinary frequency, urgency. Had right flank pain, that improved after an episode of dysuria. No hematuria. Denies abdominal pain. Nausea without vomiting. Had some diarrhea last night. Denies fever, chills, body aches. History of kidney stones with similar symptoms.      Past Medical History:  Diagnosis Date  . Anxiety disorder   . Arthritis    BILATERAL SHOULDERS, ELBOWS AND HANDS AND LEFT HIP AND KNEES--HX CORTISONE SHOTS IN SHOULDERS, ELBOWS, HIP AND KNEES  . Basal cell carcinoma of back 04/30/2016   Dermatologist- Dr Nevada Crane;   MOHS sx- Dr Levada Dy   . Bladder outlet obstruction   . Chronic idiopathic constipation 05/08/2015  . Complication of anesthesia    DIFFICULT WAKING   . COPD (chronic obstructive pulmonary disease) Gold C Frequent exacerbations 01/13/2014   [name] 07/08/14: FeV1 51% FeV1/FVC 66% FVC 59% 10/5/2015ONO RA was normal  10/13/2014  ONO on RA NORMAL   . COPD, frequent exacerbations ([place])    pulmologist-  dr Joya Gaskins--  [place] Rm Stage C.04-25-15 recent COPD exacerbation-much improved now, after tx. in ER [facility].  Marland Kitchen Depression   . Diabetes mellitus without complication ([place])    BODERLINE - DIET CONTROL  . Dysrhythmia    PVC'S  . Emphysema lung ([place])    stage 2  . Family history of adverse reaction to anesthesia    father would wake up with agitation   . Former smoker 01/13/2014  . Gastroesophageal reflux disease without esophagitis   . Heavy alcohol use 04/30/2016  . History of chronic bronchitis   . History of oxygen administration    oxygen use 2 l/m nasally at bedtime and exertional occasions  . History of rheumatic fever   . History of TB (tuberculosis)    1984--  hospitalized for 4 month treatment  . History  of urinary retention   . Hx of multiple concussions    x 2 per patient   . Hypertension   . Melanoma ([place])   . Nocturnal oxygen desaturation    USES O2 NIGHTLY  . OSA (obstructive sleep apnea) 01/13/2014  . PONV (postoperative nausea and vomiting)   . Post-polio syndrome    polio at age 52--PT WAS IN IRON LUNG; PT WAS IN W/C UNTIL AGE 58; STILL HAS WEAKNESS RIGHT SIDE  . Prostate cancer ([place])   . Schizophrenia ([place])   . Tuberculosis    Hosp 4 months rx , left early   . Urticaria    Patient Active Problem List   Diagnosis Date Noted  . COPD with acute exacerbation ([place]) 12/04/2018  . PSVT (paroxysmal supraventricular tachycardia) ([place]) 09/23/2018  . Seasonal and perennial allergic rhinitis 07/30/2018  . Asthma-COPD overlap syndrome ([place]) 07/07/2018  . S/P arthroscopy of right shoulder 04/25/2017  . Generalized anxiety disorder 04/22/2017  . Severe persistent asthma without complication 24/58/0998  . Bee sting-induced anaphylaxis 02/12/2017  . Anaphylactic reaction due to food, initial encounter 02/12/2017  . Current use of beta blocker 02/12/2017  . Allergic rhinitis 01/15/2017  . COPD exacerbation ([place]) 11/04/2016  . Asthma exacerbation 11/03/2016  . Nausea without vomiting 09/17/2016  . Shortness of breath   . Diabetes mellitus type 2 in obese (  [place]) 06/26/2016  . non-specific Chest pain 06/12/2016  . Patient's noncompliance with other medical treatment and regimen 06/02/2016  . Hyperlipidemia, mixed 06/02/2016  . Elevated liver enzymes  06/02/2016  . Schizophrenia ([place]) 04/30/2016  . Environmental and seasonal allergies 04/30/2016  . Diabetes mellitus without complication- (diet controlled; w/o proteinuria) 04/30/2016  . Heavy alcohol use- Many yrs  04/30/2016  . Basal cell carcinoma of back 04/30/2016  . Obesity 04/30/2016  . Hx of multiple concussions 04/30/2016  . h/o Vitamin D deficiency 04/30/2016  . Screen for sexually transmitted diseases 04/30/2016  .  Immunocompromised state ([place]) 04/30/2016  . Cervical radiculopathy 01/15/2016  . Alcohol withdrawal ([place]) 11/22/2015  . Alcohol abuse with alcohol-induced mood disorder ([place]) 11/22/2015  . h/o Suicidal thoughts 11/21/2015  . Dry mouth 10/07/2015  . Anxiety 09/19/2015  . OAB (overactive bladder) 07/27/2015  . Chronic idiopathic constipation 05/08/2015  . Screening for colon cancer   . Benign neoplasm of cecum   . Benign neoplasm of ascending colon   . Benign neoplasm of transverse colon   . Benign neoplasm of sigmoid colon   . Rectal polyp   . Gastroesophageal reflux disease without esophagitis   . Esophageal stricture   . Joint stiffness of multiple sites 10/17/2014  . Benign localized hyperplasia of prostate with urinary obstruction 09/29/2014  . concerns for memory loss 09/13/2014  . Tuberculosis   . Diverticulum of bladder 06/17/2014  . Acute urinary retention 05/31/2014  . Neuropathy ([place]) 04/11/2014  . Post-polio syndrome 01/13/2014  . Other emphysema ([place]) 01/13/2014  . Hypertension 01/13/2014  . Former smoker 01/13/2014  . h/o Prostate cancer 01/13/2014  . History of tuberculosis 01/13/2014  . Bipolar disorder ([place]) 01/13/2014  . Explosive personality disorder ([place]) 01/13/2014  . Erectile dysfunction 01/13/2014  . Nephrolithiasis 01/13/2014  . Chronic pain 01/13/2014  . Obstructive sleep apnea 01/13/2014  . Alcoholic gastritis 35/36/1443  . Nocturnal hypoxia 08/10/2013    Past Surgical History:  Procedure Laterality Date  . CARDIOVASCULAR STRESS TEST  06-08-2014  dr Mare Ferrari   normal lexiscan study/  no ischemia/  not gated due to PAC's  . COLONOSCOPY N/A 05/03/2015   Procedure: COLONOSCOPY;  Surgeon: [name], MD;  Location: [facility];  Service: Endoscopy;  Laterality: N/A;  . CYSTOSCOPY N/A 10/25/2015   Procedure: CYSTOSCOPY;  Surgeon: [name], MD;  Location: [facility];  Service: Urology;  Laterality: N/A;  . CYSTOSCOPY W/ CYSTOGRAM/  TRANSRECTAL  ULTRASOUND PROSTATE BX  03-22-2009  . ESOPHAGOGASTRODUODENOSCOPY N/A 03/22/2015   Procedure: ESOPHAGOGASTRODUODENOSCOPY (EGD) with dilation;  Surgeon: [name], MD;  Location: [facility];  Service: Endoscopy;  Laterality: N/A;  . excision of skin lesion    . LAPAROSCOPIC CHOLECYSTECTOMY  2013  . left elbow surgery      due to fracture   . NASAL SEPTUM SURGERY  2000  . OTHER SURGICAL HISTORY      Muscle & bone Graft/Polio  . polio surgeries      14 polio surgeries   . PROSTATE BIOPSY N/A 09/28/2014   Procedure: PROSTATE ULTRASOUND/BIOPSY;  Surgeon: [name], MD;  Location: [facility];  Service: Urology;  Laterality: N/A;  . PROSTATE BIOPSY N/A 10/25/2015   Procedure: PROSTATE BIOPSY AND ULTRASOUND;  Surgeon: [name], MD;  Location: [facility];  Service: Urology;  Laterality: N/A;  . SAVORY DILATION N/A 03/22/2015   Procedure: SAVORY DILATION;  Surgeon: [name], MD;  Location: [facility];  Service: Endoscopy;  Laterality: N/A;  .  SHOULDER ARTHROSCOPY WITH OPEN ROTATOR CUFF REPAIR Bilateral 2013  &  1999   removal spurs and bursectomy  . TRANSURETHRAL INCISION OF BLADDER NECK N/A 10/25/2015   Procedure:  TRANSURETHRAL INCISION OF BLADDER NECK;  Surgeon: [name], MD;  Location: [facility];  Service: Urology;  Laterality: N/A;  . TRANSURETHRAL RESECTION OF PROSTATE N/A 09/28/2014   Procedure: TRANSURETHRAL RESECTION OF THE PROSTATE (TURP);  Surgeon: [name], MD;  Location: [facility];  Service: Urology;  Laterality: N/A;  . URETEROSOPY STONE EXTRACTION  2000       Home Medications    Prior to Admission medications   Medication Sig Start Date End Date Taking? Authorizing Provider  albuterol (VENTOLIN HFA) 108 (90 Base) MCG/ACT inhaler Inhale 2 puffs into the lungs every 6 (six) hours as needed for wheezing or shortness of breath. 01/18/20   Valentina Shaggy, MD  arformoterol (BROVANA) 15 MCG/2ML NEBU Take 2 mLs (15 mcg total) by nebulization 2 (two) times daily. 01/21/20    Valentina Shaggy, MD  baclofen (LIORESAL) 10 MG tablet Take 10 mg by mouth daily. 08/25/19   [provider]  budesonide (PULMICORT) 0.5 MG/2ML nebulizer solution Take 2 mLs (0.5 mg total) by nebulization 2 (two) times daily. 01/21/20   Valentina Shaggy, MD  budesonide-formoterol [facility]) 160-4.5 MCG/ACT inhaler Inhale 2 puffs into the lungs 2 (two) times daily. 02/19/20   Valentina Shaggy, MD  colchicine 0.6 MG tablet TK 2 TS PO INITIALLY THEN TK 1 T IN 1 HOUR 09/20/19   [provider]  diphenhydrAMINE (SOMINEX) 25 MG tablet Take by mouth.    [provider]  EPINEPHrine (EPIPEN 2-PAK) 0.3 mg/0.3 mL IJ SOAJ injection Inject 0.3 mLs (0.3 mg total) into the muscle as needed for anaphylaxis. 07/03/19   Valentina Shaggy, MD  ipratropium-albuterol (DUONEB) 0.5-2.5 (3) MG/3ML SOLN Take 3 mLs by nebulization every evening. 01/21/20   Valentina Shaggy, MD  loratadine (CLARITIN) 10 MG tablet Take 1 tablet (10 mg total) by mouth daily as needed for allergies. 01/18/20   Valentina Shaggy, MD  nitroGLYCERIN (NITROSTAT) 0.4 MG SL tablet Place 1 tablet (0.4 mg total) under the tongue every 5 (five) minutes as needed for chest pain. 06/21/15   Brunetta Jeans, PA-C  ondansetron (ZOFRAN ODT) 4 MG disintegrating tablet Take 1 tablet (4 mg total) by mouth every 8 (eight) hours as needed for nausea or vomiting. 03/03/20   Tasia Catchings, Ruth Tully V, PA-C  OXYGEN Inhale 2-3 L into the lungs at bedtime. DURING THE DAY IF NEEDED Reported on 11/25/2015    [provider]  pantoprazole (PROTONIX) 40 MG tablet Take 1 tablet (40 mg total) by mouth daily. 01/18/20   Valentina Shaggy, MD  predniSONE (DELTASONE) 5 MG tablet Take 1 tablet (5 mg total) by mouth 2 (two) times daily. 02/23/20   Valentina Shaggy, MD  Respiratory Therapy Supplies (NEBULIZER/TUBING/MOUTHPIECE) KIT 1 each by Does not apply route as directed. 07/24/19   Valentina Shaggy, MD  revefenacin [facility]) 175  MCG/3ML nebulizer solution Take 3 mLs (175 mcg total) by nebulization daily. 01/18/20   Valentina Shaggy, MD  tamsulosin (FLOMAX) 0.4 MG CAPS capsule Take 1 capsule (0.4 mg total) by mouth daily. 03/03/20   Ok Edwards, PA-C    Family History Family History  Problem Relation Age of Onset  . Alzheimer's disease Father 4       Deceased  . Stomach cancer Father   . Heart  attack Father   . Heart disease Father   . Skin cancer Mother        Facial-Living  . Alcohol abuse Sister        x2  . Mental illness Sister        x2  . Alcohol abuse Sister   . Diabetes Maternal Aunt        x2  . Thyroid disease Maternal Aunt        x4  . Diabetes Maternal Uncle   . Tuberculosis Paternal Grandfather   . Tuberculosis Paternal Grandmother   . Alzheimer's disease Paternal Aunt   . Alzheimer's disease Paternal Uncle   . Colon cancer Neg Hx   . Colon polyps Neg Hx   . Crohn's disease Neg Hx   . Ulcerative colitis Neg Hx   . Allergic rhinitis Neg Hx   . Angioedema Neg Hx   . Asthma Neg Hx   . Eczema Neg Hx   . Urticaria Neg Hx     Social History Social History   Tobacco Use  . Smoking status: Former Smoker    Years: 47.00    Types: Pipe    Quit date: 08/25/2013    Years since quitting: 6.5  . Smokeless tobacco: Former Systems developer    Types: Chew  . Tobacco comment: 5-6 times smoking a pipe  Substance Use Topics  . Alcohol use: Yes    Alcohol/week: 14.0 standard drinks    Types: 14 Cans of beer per week    Comment: 2 cans daily  . Drug use: No     Allergies   Aspirin, Hornet venom, Ivp dye [iodinated diagnostic agents], Levaquin [levofloxacin in d5w], Nsaids, Penicillins, Tolmetin, Fasenra [benralizumab], Perforomist [formoterol], Buprenorphine hcl, Morphine and related, and Oxycodone   Review of Systems Review of Systems  Reason unable to perform ROS: See HPI as above.     Physical Exam Triage Vital Signs ED Triage Vitals  Enc Vitals Group     BP 03/03/20 1002 (!) 156/84      Pulse Rate 03/03/20 1002 79     Resp 03/03/20 1002 20     Temp 03/03/20 1002 97.9 F (36.6 C)     Temp Source 03/03/20 1002 Oral     SpO2 03/03/20 1002 97 %     Weight --      Height --      Head Circumference --      Peak Flow --      Pain Score 03/03/20 1003 5     Pain Loc --      Pain Edu? --      Excl. in [place]? --    No data found.  Updated Vital Signs BP (!) 156/84 (BP Location: Left Arm)   Pulse 79   Temp 97.9 F (36.6 C) (Oral)   Resp 20   SpO2 97%   Physical Exam Constitutional:      General: He is not in acute distress.    Appearance: Normal appearance. He is not ill-appearing, toxic-appearing or diaphoretic.  HENT:     Head: Normocephalic and atraumatic.  Cardiovascular:     Rate and Rhythm: Normal rate and regular rhythm.  Pulmonary:     Effort: Pulmonary effort is normal. No respiratory distress.     Comments: LCTAB Abdominal:     General: Bowel sounds are normal.     Palpations: Abdomen is soft.     Tenderness: There is no abdominal tenderness. There is right CVA tenderness.  There is no left CVA tenderness, guarding or rebound.  Musculoskeletal:     Cervical back: Normal range of motion and neck supple.  Skin:    General: Skin is warm and dry.  Neurological:     Mental Status: He is alert and oriented to person, place, and time.      UC Treatments / Results  Labs (all labs ordered are listed, but only abnormal results are displayed) Labs Reviewed  POCT URINALYSIS DIP (MANUAL ENTRY) - Abnormal; Notable for the following components:      Result Value   Ketones, POC UA moderate (40) (*)    All other components within normal limits    EKG   Radiology No results found.  Procedures Procedures (including critical care time)  Medications Ordered in UC Medications  ondansetron (ZOFRAN-ODT) disintegrating tablet 4 mg (4 mg Oral Given 03/03/20 1009)    Initial Impression / Assessment and Plan / UC Course  I have reviewed the triage vital  signs and the nursing notes.  Pertinent labs & imaging results that were available during my care of the patient were reviewed by me and considered in my medical decision making (see chart for details).    No alarming signs. Ketones in urine, no glucose, diarrhea resolved, no abdominal pain, lower suspicion for DKA at this time. Ketones ? From nausea due to possible urethral stone, though may have passed as right flank pain much improved. zofran in office with relief of nausea. If continues with symptoms, can try flomax. Strict return precautions given. Patient expresses understanding and agrees to plan.   Final Clinical Impressions(s) / UC Diagnoses   Final diagnoses:  Right flank pain  Dysuria   ED Prescriptions    Medication Sig Dispense Auth. Provider   ondansetron (ZOFRAN ODT) 4 MG disintegrating tablet Take 1 tablet (4 mg total) by mouth every 8 (eight) hours as needed for nausea or vomiting. 20 tablet Ayanni Tun V, PA-C   tamsulosin (FLOMAX) 0.4 MG CAPS capsule Take 1 capsule (0.4 mg total) by mouth daily. 10 capsule Ok Edwards, PA-C     PDMP not reviewed this encounter.   Ok Edwards, PA-C 03/03/20 1542

## 2020-03-03 NOTE — ED Triage Notes (Signed)
Pt c/o rt flank with urinary urgency and frequency. States hx of kidney stones and tx's it by drinking lots of water. States now having severe nausea and urinated 12 times last nigh. Pt states his b/p was high this am.

## 2020-03-03 NOTE — Discharge Instructions (Signed)
Urine negative for infection/blood. Start zofran as directed for nausea. Flomax for possible kidney stones. Keep hydrated, urine should be clear to pale yellow in color. Follow up with PCP/urology if symptoms not improving. If worsening symptoms, vomiting despite medicine, abdominal pain, go to the ED for further evaluation

## 2020-03-08 ENCOUNTER — Ambulatory Visit
Admission: EM | Admit: 2020-03-08 | Discharge: 2020-03-08 | Disposition: A | Discharge status: Home / Self Care | Payer: Medicare Other

## 2020-03-08 DIAGNOSIS — R079 Chest pain, unspecified: Secondary | ICD-10-CM

## 2020-03-08 NOTE — ED Triage Notes (Signed)
Pt states lt sided chest pain woke him up from his sleep at 2am and had 2 addition episodes, all lasting 32mins. Denies SOB or diaphoresis. States couldn't find his nitro so he just laid back down. Pt states he has been taking 6 25mg s of benadryl x3 days for chiggers. States also taking prednisone 20mg  a day.

## 2020-03-08 NOTE — Discharge Instructions (Signed)
Your EKG does not show any significant changes. Given you do not have chest pain right now, please call your cardiologist for these symptoms and see if you need to follow up sooner. Use nitro as needed. If experiencing chest pain again, go to the ED.

## 2020-03-08 NOTE — ED Provider Notes (Addendum)
[name]    CSN: [id number] Arrival date & time: 03/08/20  1448      History   Chief Complaint Chief Complaint  Patient presents with  . Chest Pain  . Insect Bite    HPI [name] is a 69 y.o. male.   69 year old male with history of angina, COPD comes in for few episodes of chest pain. States had left sided  Chest pain that woke him up earlier today, this lasted for 2-3 mins. Had 2 more episodes that lasted for same length of time. Pain is to the substernal/left chest that is sharp in sensation. States baseline shob due to asthma without worsening. Denies associated diaphoresis, nausea/vomiting, abdominal pain. Has had exertional fatigue and chest pain the past 6 months, for which cardiologist is aware and has stress test scheduled in 1 week. Former smoker. Denies current chest pain. Did not take nitro during episodes has he could not find it.     Past Medical History:  Diagnosis Date  . Anxiety disorder   . Arthritis    BILATERAL SHOULDERS, ELBOWS AND HANDS AND LEFT HIP AND KNEES--HX CORTISONE SHOTS IN SHOULDERS, ELBOWS, HIP AND KNEES  . Basal cell carcinoma of back 04/30/2016   Dermatologist- Dr Nevada Crane;   MOHS sx- Dr Levada Dy   . Bladder outlet obstruction   . Chronic idiopathic constipation 05/08/2015  . Complication of anesthesia    DIFFICULT WAKING   . COPD (chronic obstructive pulmonary disease) [place] C Frequent exacerbations 01/13/2014   [name] 07/08/14: FeV1 51% FeV1/FVC 66% FVC 59% 10/5/2015ONO RA was normal  10/13/2014  ONO on RA NORMAL   . COPD, frequent exacerbations ([place])    pulmologist-  dr Joya Gaskins--  [place] Rm Stage C.04-25-15 recent COPD exacerbation-much improved now, after tx. in ER [facility].  Marland Kitchen Depression   . Diabetes mellitus without complication ([place])    BODERLINE - DIET CONTROL  . Dysrhythmia    PVC'S  . Emphysema lung ([place])    stage 2  . Family history of adverse reaction to anesthesia    father would wake up with agitation   . Former  smoker 01/13/2014  . Gastroesophageal reflux disease without esophagitis   . Heavy alcohol use 04/30/2016  . History of chronic bronchitis   . History of oxygen administration    oxygen use 2 l/m nasally at bedtime and exertional occasions  . History of rheumatic fever   . History of TB (tuberculosis)    1984--  hospitalized for 4 month treatment  . History of urinary retention   . Hx of multiple concussions    x 2 per patient   . Hypertension   . Melanoma ([place])   . Nocturnal oxygen desaturation    USES O2 NIGHTLY  . OSA (obstructive sleep apnea) 01/13/2014  . PONV (postoperative nausea and vomiting)   . Post-polio syndrome    polio at age 72--PT WAS IN IRON LUNG; PT WAS IN W/C UNTIL AGE 71; STILL HAS WEAKNESS RIGHT SIDE  . Prostate cancer ([place])   . Schizophrenia ([place])   . Tuberculosis    Hosp 4 months rx , left early   . Urticaria     Patient Active Problem List   Diagnosis Date Noted  . COPD with acute exacerbation ([place]) 12/04/2018  . PSVT (paroxysmal supraventricular tachycardia) ([place] 09/23/2018  . Seasonal and perennial allergic rhinitis 07/30/2018  . Asthma-COPD overlap syndrome ([place]) 07/07/2018  . S/P arthroscopy of right shoulder 04/25/2017  . Generalized anxiety  disorder 04/22/2017  . Severe persistent asthma without complication 64/68/0321  . Bee sting-induced anaphylaxis 02/12/2017  . Anaphylactic reaction due to food, initial encounter 02/12/2017  . Current use of beta blocker 02/12/2017  . Allergic rhinitis 01/15/2017  . COPD exacerbation ([place]) 11/04/2016  . Asthma exacerbation 11/03/2016  . Nausea without vomiting 09/17/2016  . Shortness of breath   . Diabetes mellitus type 2 in obese ([place]) 06/26/2016  . non-specific Chest pain 06/12/2016  . Patient's noncompliance with other medical treatment and regimen 06/02/2016  . Hyperlipidemia, mixed 06/02/2016  . Elevated liver enzymes  06/02/2016  . Schizophrenia ([place]) 04/30/2016  . Environmental and seasonal  allergies 04/30/2016  . Diabetes mellitus without complication- (diet controlled; w/o proteinuria) 04/30/2016  . Heavy alcohol use- Many yrs  04/30/2016  . Basal cell carcinoma of back 04/30/2016  . Obesity 04/30/2016  . Hx of multiple concussions 04/30/2016  . h/o Vitamin D deficiency 04/30/2016  . Screen for sexually transmitted diseases 04/30/2016  . Immunocompromised state ([place]) 04/30/2016  . Cervical radiculopathy 01/15/2016  . Alcohol withdrawal ([place]) 11/22/2015  . Alcohol abuse with alcohol-induced mood disorder ([place]) 11/22/2015  . h/o Suicidal thoughts 11/21/2015  . Dry mouth 10/07/2015  . Anxiety 09/19/2015  . OAB (overactive bladder) 07/27/2015  . Chronic idiopathic constipation 05/08/2015  . Screening for colon cancer   . Benign neoplasm of cecum   . Benign neoplasm of ascending colon   . Benign neoplasm of transverse colon   . Benign neoplasm of sigmoid colon   . Rectal polyp   . Gastroesophageal reflux disease without esophagitis   . Esophageal stricture   . Joint stiffness of multiple sites 10/17/2014  . Benign localized hyperplasia of prostate with urinary obstruction 09/29/2014  . concerns for memory loss 09/13/2014  . Tuberculosis   . Diverticulum of bladder 06/17/2014  . Acute urinary retention 05/31/2014  . Neuropathy ([place]) 04/11/2014  . Post-polio syndrome 01/13/2014  . Other emphysema ([place]) 01/13/2014  . Hypertension 01/13/2014  . Former smoker 01/13/2014  . h/o Prostate cancer 01/13/2014  . History of tuberculosis 01/13/2014  . Bipolar disorder ([place]) 01/13/2014  . Explosive personality disorder ([place]) 01/13/2014  . Erectile dysfunction 01/13/2014  . Nephrolithiasis 01/13/2014  . Chronic pain 01/13/2014  . Obstructive sleep apnea 01/13/2014  . Alcoholic gastritis 22/48/2500  . Nocturnal hypoxia 08/10/2013    Past Surgical History:  Procedure Laterality Date  . CARDIOVASCULAR STRESS TEST  06-08-2014  dr Mare Ferrari   normal lexiscan study/  no  ischemia/  not gated due to PAC's  . COLONOSCOPY N/A 05/03/2015   Procedure: COLONOSCOPY;  Surgeon: [name], MD;  Location: [facility];  Service: Endoscopy;  Laterality: N/A;  . CYSTOSCOPY N/A 10/25/2015   Procedure: CYSTOSCOPY;  Surgeon: [name], MD;  Location: [facility];  Service: Urology;  Laterality: N/A;  . CYSTOSCOPY W/ CYSTOGRAM/  TRANSRECTAL ULTRASOUND PROSTATE BX  03-22-2009  . ESOPHAGOGASTRODUODENOSCOPY N/A 03/22/2015   Procedure: ESOPHAGOGASTRODUODENOSCOPY (EGD) with dilation;  Surgeon: [name], MD;  Location: [facility];  Service: Endoscopy;  Laterality: N/A;  . excision of skin lesion    . LAPAROSCOPIC CHOLECYSTECTOMY  2013  . left elbow surgery      due to fracture   . NASAL SEPTUM SURGERY  2000  . OTHER SURGICAL HISTORY      Muscle & bone Graft/Polio  . polio surgeries      14 polio surgeries   . PROSTATE BIOPSY N/A 09/28/2014   Procedure: PROSTATE ULTRASOUND/BIOPSY;  Surgeon: Jenny Reichmann  Keene Breath, MD;  Location: [facility];  Service: Urology;  Laterality: N/A;  . PROSTATE BIOPSY N/A 10/25/2015   Procedure: PROSTATE BIOPSY AND ULTRASOUND;  Surgeon: [name], MD;  Location: [facility];  Service: Urology;  Laterality: N/A;  . SAVORY DILATION N/A 03/22/2015   Procedure: SAVORY DILATION;  Surgeon: [name], MD;  Location: [facility];  Service: Endoscopy;  Laterality: N/A;  . SHOULDER ARTHROSCOPY WITH OPEN ROTATOR CUFF REPAIR Bilateral 2013  &  1999   removal spurs and bursectomy  . TRANSURETHRAL INCISION OF BLADDER NECK N/A 10/25/2015   Procedure:  TRANSURETHRAL INCISION OF BLADDER NECK;  Surgeon: [name], MD;  Location: [facility];  Service: Urology;  Laterality: N/A;  . TRANSURETHRAL RESECTION OF PROSTATE N/A 09/28/2014   Procedure: TRANSURETHRAL RESECTION OF THE PROSTATE (TURP);  Surgeon: [name], MD;  Location: [facility];  Service: Urology;  Laterality: N/A;  . URETEROSOPY STONE EXTRACTION  2000       Home Medications    Prior to Admission medications   Medication  Sig Start Date End Date Taking? Authorizing Provider  albuterol (VENTOLIN HFA) 108 (90 Base) MCG/ACT inhaler Inhale 2 puffs into the lungs every 6 (six) hours as needed for wheezing or shortness of breath. 01/18/20   Valentina Shaggy, MD  arformoterol (BROVANA) 15 MCG/2ML NEBU Take 2 mLs (15 mcg total) by nebulization 2 (two) times daily. 01/21/20   Valentina Shaggy, MD  baclofen (LIORESAL) 10 MG tablet Take 10 mg by mouth daily. 08/25/19   [provider]  budesonide (PULMICORT) 0.5 MG/2ML nebulizer solution Take 2 mLs (0.5 mg total) by nebulization 2 (two) times daily. 01/21/20   Valentina Shaggy, MD  budesonide-formoterol [facility]) 160-4.5 MCG/ACT inhaler Inhale 2 puffs into the lungs 2 (two) times daily. 02/19/20   Valentina Shaggy, MD  colchicine 0.6 MG tablet TK 2 TS PO INITIALLY THEN TK 1 T IN 1 HOUR 09/20/19   [provider]  diphenhydrAMINE (SOMINEX) 25 MG tablet Take by mouth.    [provider]  EPINEPHrine (EPIPEN 2-PAK) 0.3 mg/0.3 mL IJ SOAJ injection Inject 0.3 mLs (0.3 mg total) into the muscle as needed for anaphylaxis. 07/03/19   Valentina Shaggy, MD  ipratropium-albuterol (DUONEB) 0.5-2.5 (3) MG/3ML SOLN Take 3 mLs by nebulization every evening. 01/21/20   Valentina Shaggy, MD  loratadine (CLARITIN) 10 MG tablet Take 1 tablet (10 mg total) by mouth daily as needed for allergies. 01/18/20   Valentina Shaggy, MD  nitroGLYCERIN (NITROSTAT) 0.4 MG SL tablet Place 1 tablet (0.4 mg total) under the tongue every 5 (five) minutes as needed for chest pain. 06/21/15   Brunetta Jeans, PA-C  ondansetron (ZOFRAN ODT) 4 MG disintegrating tablet Take 1 tablet (4 mg total) by mouth every 8 (eight) hours as needed for nausea or vomiting. 03/03/20   Tasia Catchings, Shariq Puig V, PA-C  OXYGEN Inhale 2-3 L into the lungs at bedtime. DURING THE DAY IF NEEDED Reported on 11/25/2015    [provider]  pantoprazole (PROTONIX) 40 MG tablet Take 1 tablet (40 mg total)  by mouth daily. 01/18/20   Valentina Shaggy, MD  predniSONE (DELTASONE) 5 MG tablet Take 1 tablet (5 mg total) by mouth 2 (two) times daily. 02/23/20   Valentina Shaggy, MD  Respiratory Therapy Supplies (NEBULIZER/TUBING/MOUTHPIECE) KIT 1 each by Does not apply route as directed. 07/24/19   Valentina Shaggy, MD  revefenacin [facility]) 175 MCG/3ML nebulizer solution Take 3 mLs (175  mcg total) by nebulization daily. 01/18/20   Valentina Shaggy, MD  tamsulosin (FLOMAX) 0.4 MG CAPS capsule Take 1 capsule (0.4 mg total) by mouth daily. 03/03/20   Ok Edwards, PA-C    Family History Family History  Problem Relation Age of Onset  . Alzheimer's disease Father 4       Deceased  . Stomach cancer Father   . Heart attack Father   . Heart disease Father   . Skin cancer Mother        Facial-Living  . Alcohol abuse Sister        x2  . Mental illness Sister        x2  . Alcohol abuse Sister   . Diabetes Maternal Aunt        x2  . Thyroid disease Maternal Aunt        x4  . Diabetes Maternal Uncle   . Tuberculosis Paternal Grandfather   . Tuberculosis Paternal Grandmother   . Alzheimer's disease Paternal Aunt   . Alzheimer's disease Paternal Uncle   . Colon cancer Neg Hx   . Colon polyps Neg Hx   . Crohn's disease Neg Hx   . Ulcerative colitis Neg Hx   . Allergic rhinitis Neg Hx   . Angioedema Neg Hx   . Asthma Neg Hx   . Eczema Neg Hx   . Urticaria Neg Hx     Social History Social History   Tobacco Use  . Smoking status: Former Smoker    Years: 47.00    Types: Pipe    Quit date: 08/25/2013    Years since quitting: 6.5  . Smokeless tobacco: Former Systems developer    Types: Chew  . Tobacco comment: 5-6 times smoking a pipe  Substance Use Topics  . Alcohol use: Yes    Alcohol/week: 14.0 standard drinks    Types: 14 Cans of beer per week    Comment: 2 cans daily  . Drug use: No     Allergies   Aspirin, Hornet venom, Ivp dye [iodinated diagnostic agents], Levaquin  [levofloxacin in d5w], Nsaids, Penicillins, Tolmetin, Fasenra [benralizumab], Perforomist [formoterol], Buprenorphine hcl, Morphine and related, and Oxycodone   Review of Systems Review of Systems  Reason unable to perform ROS: See HPI as above.     Physical Exam Triage Vital Signs ED Triage Vitals  Enc Vitals Group     BP 03/08/20 1458 (!) 148/80     Pulse Rate 03/08/20 1458 89     Resp 03/08/20 1458 18     Temp 03/08/20 1458 98.3 F (36.8 C)     Temp Source 03/08/20 1458 Oral     SpO2 03/08/20 1458 94 %     Weight --      Height --      Head Circumference --      Peak Flow --      Pain Score 03/08/20 1509 0     Pain Loc --      Pain Edu? --      Excl. in [place]? --    No data found.  Updated Vital Signs BP (!) 148/80 (BP Location: Left Arm)   Pulse 89   Temp 98.3 F (36.8 C) (Oral)   Resp 18   SpO2 94%   Physical Exam Constitutional:      General: He is not in acute distress.    Appearance: Normal appearance. He is well-developed. He is not toxic-appearing or diaphoretic.  HENT:  Head: Normocephalic and atraumatic.  Eyes:     Conjunctiva/sclera: Conjunctivae normal.     Pupils: Pupils are equal, round, and reactive to light.  Cardiovascular:     Rate and Rhythm: Normal rate and regular rhythm.  Pulmonary:     Effort: Pulmonary effort is normal. No respiratory distress.     Comments: Speaking in full sentences without difficulty. LCTAB Chest:     Chest wall: Tenderness present.  Musculoskeletal:     Cervical back: Normal range of motion and neck supple.  Skin:    General: Skin is warm and dry.  Neurological:     Mental Status: He is alert and oriented to person, place, and time.      UC Treatments / Results  Labs (all labs ordered are listed, but only abnormal results are displayed) Labs Reviewed - No data to display  EKG   Radiology No results found.  Procedures Procedures (including critical care time)  Medications Ordered in  UC Medications - No data to display  Initial Impression / Assessment and Plan / UC Course  I have reviewed the triage vital signs and the nursing notes.  Pertinent labs & imaging results that were available during my care of the patient were reviewed by me and considered in my medical decision making (see chart for details).    EKG NSR, 77bpm, T wave inversion on lead III, not present on EKG 07/23/2020, but present on EKG 06/28/2020. No other obvious changes compared to prior. Currently asymptomatic. Has nitro on hand now. Has scheduled appointment with cardiologist in 1 week. Patient to call cardiologist of current event. Strict return precautions given.   Patient's wife came into clinic in regards to chigger bites on patient. Discussed with wife, at patient's visit, had discussed with patient he was already on necessary medicines for itching and will focus on chest pain, for which he agreed to. To follow up with PCP for further management needed.  Final Clinical Impressions(s) / UC Diagnoses   Final diagnoses:  Chest pain, unspecified type    ED Prescriptions    None     PDMP not reviewed this encounter.   Ok Edwards, PA-C 03/08/20 [place], Dyquan Minks V, PA-C 03/08/20 1724

## 2020-03-15 ENCOUNTER — Ambulatory Visit (HOSPITAL_COMMUNITY)
Admission: AD | Admit: 2020-03-15 | Discharge: 2020-03-15 | Disposition: A | Discharge status: Home / Self Care | Payer: Medicare Other | Attending: Psychiatry | Admitting: Psychiatry

## 2020-03-15 DIAGNOSIS — F419 Anxiety disorder, unspecified: Secondary | ICD-10-CM | POA: Diagnosis present

## 2020-03-15 NOTE — BH Assessment (Signed)
Assessment Note  [name] is an 69 y.o. male with a history of anxiety and Alcohol Use Disorder who presents voluntarily complaining of worsening anxiety.  Patient gives symptoms of poor concentration, feeling easily overwhelmed and fearful.  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  This has contributed to his anxiety, especially after having to move out of his home due the the home being built on commercial land.  Patient has questions about Bipolar disorder, as at some point "many years ago' he was diagnosed, however only tried seroquel for a short period.  Patient does have depressive episodes, however they happen infrequently.  He states he has been self-medicating anxiety with either alcohol or valium, provided to him by his sister who is a nurse and prescribed valium.  Patient is aware of the concerns with taking valium for addictive properties, along with concerns for respiratory depression.  Patient is denying SI, HI and AVH.  He has asked for referrals for outpatient psychiatry and outpatient therapy. He has also expressed interest in the [facility] IOP program.  Patient will be referred to this program and provided with additional resources.   Patient is anxious, pleasant and behaviorally appropriate.  Patient is casually dressed.  Speech is logical/coherent.  Eye contact is good.  Patient's mood is anxious.  Affect is congruent with mood.  Thought process is coherent and relevant.  There is no indication patient is responding to internal stimuli or experiencing delusional thought content.  Judgement and insight are fair.  Per Harriett Sine, NP patient does not meet criteria for inpatient treatment.  He has been referred to [facility] BH IOP and provided with additional resources.    Diagnosis: F41.9 Anxiety Disorder, Unspecified  Past Medical History:  Past Medical History:  Diagnosis Date  . Anxiety disorder   .  Arthritis    BILATERAL SHOULDERS, ELBOWS AND HANDS AND LEFT HIP AND KNEES--HX CORTISONE SHOTS IN SHOULDERS, ELBOWS, HIP AND KNEES  . Basal cell carcinoma of back 04/30/2016   Dermatologist- Dr Nevada Crane;   MOHS sx- Dr Levada Dy   . Bladder outlet obstruction   . Chronic idiopathic constipation 05/08/2015  . Complication of anesthesia    DIFFICULT WAKING   . COPD (chronic obstructive pulmonary disease) Gold C Frequent exacerbations 01/13/2014   [name] 07/08/14: FeV1 51% FeV1/FVC 66% FVC 59% 10/5/2015ONO RA was normal  10/13/2014  ONO on RA NORMAL   . COPD, frequent exacerbations ([place])    pulmologist-  dr Joya Gaskins--  [place]a Rm Stage C.04-25-15 recent COPD exacerbation-much improved now, after tx. in ER [facility].  Marland Kitchen Depression   . Diabetes mellitus without complication ([place])    BODERLINE - DIET CONTROL  . Dysrhythmia    PVC'S  . Emphysema lung ([place])    stage 2  . Family history of adverse reaction to anesthesia    father would wake up with agitation   . Former smoker 01/13/2014  . Gastroesophageal reflux disease without esophagitis   . Heavy alcohol use 04/30/2016  . History of chronic bronchitis   . History of oxygen administration    oxygen use 2 l/m nasally at bedtime and exertional occasions  . History of rheumatic fever   . History of TB (tuberculosis)    1984--  hospitalized for 4 month treatment  . History of urinary retention   . Hx of multiple concussions    x 2 per patient   .  Hypertension   . Melanoma ([place])   . Nocturnal oxygen desaturation    USES O2 NIGHTLY  . OSA (obstructive sleep apnea) 01/13/2014  . PONV (postoperative nausea and vomiting)   . Post-polio syndrome    polio at age 71--PT WAS IN IRON LUNG; PT WAS IN W/C UNTIL AGE 30; STILL HAS WEAKNESS RIGHT SIDE  . Prostate cancer ([place])   . Schizophrenia ([place])   . Tuberculosis    Hosp 4 months rx , left early   . Urticaria     Past Surgical History:  Procedure Laterality Date  . CARDIOVASCULAR STRESS TEST  06-08-2014  dr  Mare Ferrari   normal lexiscan study/  no ischemia/  not gated due to PAC's  . COLONOSCOPY N/A 05/03/2015   Procedure: COLONOSCOPY;  Surgeon: [name], MD;  Location: [facility];  Service: Endoscopy;  Laterality: N/A;  . CYSTOSCOPY N/A 10/25/2015   Procedure: CYSTOSCOPY;  Surgeon: [name], MD;  Location: [facility]S;  Service: Urology;  Laterality: N/A;  . CYSTOSCOPY W/ CYSTOGRAM/  TRANSRECTAL ULTRASOUND PROSTATE BX  03-22-2009  . ESOPHAGOGASTRODUODENOSCOPY N/A 03/22/2015   Procedure: ESOPHAGOGASTRODUODENOSCOPY (EGD) with dilation;  Surgeon: [name], MD;  Location: [facility];  Service: Endoscopy;  Laterality: N/A;  . excision of skin lesion    . LAPAROSCOPIC CHOLECYSTECTOMY  2013  . left elbow surgery      due to fracture   . NASAL SEPTUM SURGERY  2000  . OTHER SURGICAL HISTORY      Muscle & bone Graft/Polio  . polio surgeries      14 polio surgeries   . PROSTATE BIOPSY N/A 09/28/2014   Procedure: PROSTATE ULTRASOUND/BIOPSY;  Surgeon: [name], MD;  [facility];  Service: Urology;  Laterality: N/A;  . PROSTATE BIOPSY N/A 10/25/2015   Procedure: PROSTATE BIOPSY AND ULTRASOUND;  Surgeon: [name], MD;  Location: [facility];  Service: Urology;  Laterality: N/A;  . SAVORY DILATION N/A 03/22/2015   Procedure: SAVORY DILATION;  Surgeon: [name], MD;  [facility];  Service: Endoscopy;  Laterality: N/A;  . SHOULDER ARTHROSCOPY WITH OPEN ROTATOR CUFF REPAIR Bilateral 2013  &  1999   removal spurs and bursectomy  . TRANSURETHRAL INCISION OF BLADDER NECK N/A 10/25/2015   Procedure:  TRANSURETHRAL INCISION OF BLADDER NECK;  Surgeon: [name], MD;  [facility];  Service: Urology;  Laterality: N/A;  . TRANSURETHRAL RESECTION OF PROSTATE N/A 09/28/2014   Procedure: TRANSURETHRAL RESECTION OF THE PROSTATE (TURP);  Surgeon: [name], MD;  [facility];  Service: Urology;  Laterality: N/A;  . URETEROSOPY STONE EXTRACTION  2000    Family History:  Family History   Problem Relation Age of Onset  . Alzheimer's disease Father 4       Deceased  . Stomach cancer Father   . Heart attack Father   . Heart disease Father   . Skin cancer Mother        Facial-Living  . Alcohol abuse Sister        x2  . Mental illness Sister        x2  . Alcohol abuse Sister   . Diabetes Maternal Aunt        x2  . Thyroid disease Maternal Aunt        x4  . Diabetes Maternal Uncle   . Tuberculosis Paternal Grandfather   . Tuberculosis Paternal Grandmother   . Alzheimer's disease Paternal Aunt   . Alzheimer's disease Paternal Uncle   .  Colon cancer Neg Hx   . Colon polyps Neg Hx   . Crohn's disease Neg Hx   . Ulcerative colitis Neg Hx   . Allergic rhinitis Neg Hx   . Angioedema Neg Hx   . Asthma Neg Hx   . Eczema Neg Hx   . Urticaria Neg Hx     Social History:  reports that he quit smoking about 6 years ago. His smoking use included pipe. He quit after 47.00 years of use. He has quit using smokeless tobacco.  His smokeless tobacco use included chew. He reports current alcohol use of about 14.0 standard drinks of alcohol per week. He reports that he does not use drugs.  Additional Social History:  Alcohol / Drug Use Pain Medications: See MAR Prescriptions: See MAR Over the Counter: See MAR History of alcohol / drug use?: Yes Longest period of sobriety (when/how long): unknown Negative Consequences of Use: Personal relationships Substance #1 Name of Substance 1: ETOH 1 - Age of First Use: unknown 1 - Amount (size/oz): 1-6 beers 1 - Frequency: 3 X per week 1 - Duration: "years" 1 - Last Use / Amount: several days ago  CIWA: CIWA-Ar BP: (!) 152/78 Pulse Rate: 82 COWS:    Allergies:  Allergies  Allergen Reactions  . Aspirin Anaphylaxis  . Hornet Venom Anaphylaxis  . Ivp Dye [Iodinated Diagnostic Agents] Anaphylaxis  . Levaquin [Levofloxacin In D5w] Shortness Of Breath and Swelling    In addition: sweating, chest pain, and diarrhea.   . Nsaids  Anaphylaxis  . Penicillins Anaphylaxis    Heart stops Has patient had a PCN reaction causing immediate rash, facial/tongue/throat swelling, SOB or lightheadedness with hypotension: yes Has patient had a PCN reaction causing severe rash involving mucus membranes or skin necrosis: NO Has patient had a PCN reaction that required hospitalization yes Has patient had a PCN reaction occurring within the last 10 years: No If all of the above answers are "NO", then may proceed with Cephalosporin use.   . Tolmetin Anaphylaxis  . Fasenra [Benralizumab] Hives  . Perforomist [Formoterol]     Increased wheezing, shortness of breath  . Buprenorphine Hcl Nausea And Vomiting    Can take with zofran   . Morphine And Related Nausea And Vomiting    Can take with zofran   . Oxycodone Itching and Rash    Home Medications: (Not in a hospital admission)   OB/GYN Status:  No LMP for male patient.  General Assessment Data Location of Assessment: [facility] Assessment Services TTS Assessment: In system Is this a Tele or Face-to-Face Assessment?: Face-to-Face Is this an Initial Assessment or a Re-assessment for this encounter?: Initial Assessment Patient Accompanied by:: N/A Language Other than English: No Living Arrangements: (Private home) What gender do you identify as?: Male Date Telepsych consult ordered in [facility]: 03/15/20 Time Telepsych consult ordered in [facility]: 1408 Marital status: Married [place] name: N/A Pregnancy Status: No Living Arrangements: Spouse/significant other, Other relatives(lives with wife and mother in law) Can pt return to current living arrangement?: Yes Admission Status: Voluntary Is patient capable of signing voluntary admission?: Yes Referral Source: Self/Family/Friend Insurance type: [employer] MCR  Medical Screening Exam ([place]) Medical Exam completed: Yes  Crisis Care Plan Living Arrangements: Spouse/significant other, Other relatives(lives with wife and mother in  law) Legal Guardian: Other:(Self) Name of Psychiatrist: None Name of Therapist: None  Education Status Is patient currently in school?: No Is the patient employed, unemployed or receiving disability?: Unemployed  Risk to self  with the past 6 months Suicidal Ideation: No Has patient been a risk to self within the past 6 months prior to admission? : No Suicidal Intent: No Has patient had any suicidal intent within the past 6 months prior to admission? : No Is patient at risk for suicide?: No Suicidal Plan?: No Has patient had any suicidal plan within the past 6 months prior to admission? : No Access to Means: No What has been your use of drugs/alcohol within the last 12 months?: Alcohol use 3X wk Previous Attempts/Gestures: No How many times?: 0 Other Self Harm Risks: None Triggers for Past Attempts: (N/A) Intentional Self Injurious Behavior: None Family Suicide History: No Recent stressful life event(s): Other (Comment)(had to leave home, move in with mother in law) Persecutory voices/beliefs?: No Depression: No Depression Symptoms: (occasional symptoms) Substance abuse history and/or treatment for substance abuse?: Yes Suicide prevention information given to non-admitted patients: Not applicable  Risk to Others within the past 6 months Homicidal Ideation: No Does patient have any lifetime risk of violence toward others beyond the six months prior to admission? : No Thoughts of Harm to Others: No Current Homicidal Intent: No Current Homicidal Plan: No Access to Homicidal Means: No Identified Victim: N/A History of harm to others?: No Assessment of Violence: None Noted Violent Behavior Description: n Does patient have access to weapons?: No Criminal Charges Pending?: No Does patient have a court date: No Is patient on probation?: No  Psychosis Hallucinations: None noted Delusions: None noted  Mental Status Report Appearance/Hygiene: Unremarkable Eye Contact: Good  Motor Activity: Unremarkable Speech: Logical/coherent Level of Consciousness: Alert Mood: Anxious Affect: Appropriate to circumstance Anxiety Level: Moderate Thought Processes: Coherent, Relevant Judgement: Unimpaired Orientation: Person, Place, Time, Situation Obsessive Compulsive Thoughts/Behaviors: None  Cognitive Functioning Concentration: Normal Memory: Recent Intact, Remote Intact Is patient IDD: No Insight: Good Impulse Control: Good Appetite: Fair Have you had any weight changes? : No Change Sleep: No Change Total Hours of Sleep: 8 Vegetative Symptoms: None  ADLScreening [facility] Assessment Services) Patient's cognitive ability adequate to safely complete daily activities?: Yes Patient able to express need for assistance with ADLs?: Yes Independently performs ADLs?: Yes (appropriate for developmental age)  Prior Inpatient Therapy Prior Inpatient Therapy: No  Prior Outpatient Therapy Prior Outpatient Therapy: No Does patient have an ACCT team?: No Does patient have Intensive In-House Services?  : No Does patient have [facility] services? : No Does patient have P4CC services?: No  ADL Screening (condition at time of admission) Patient's cognitive ability adequate to safely complete daily activities?: Yes Patient able to express need for assistance with ADLs?: Yes Independently performs ADLs?: Yes (appropriate for developmental age)   Regulatory affairs officer (For Healthcare) Does Patient Have a Medical Advance Directive?: No Would patient like information on creating a medical advance directive?: No - Patient declined  Disposition: Per Harriett Sine, NP patient does not meet criteria for inpatient treatment.  He has been referred to [facility] BH IOP and provided with additional resources.   Disposition Initial Assessment Completed for this Encounter: Yes Disposition of Patient: Discharge Patient refused recommended treatment: No Mode of transportation if patient is  discharged/movement?: Car Patient referred to: Outpatient clinic referral([facility] , additional resources provided)  On Site Evaluation by:   Reviewed with Physician:    Fransico Meadow 03/15/2020 4:33 PM

## 2020-03-15 NOTE — H&P (Addendum)
Behavioral Health Medical Screening Exam  [name] is a 69 y.o. male. He is presenting for treatment of anxiety. He denies SI/HI/AVH. He is interested in outpatient resources.  Patient reports he has been taking his sister's Valium as needed for anxiety. He denies daily use. Patient was encouraged to refrain from taking medications that were not prescribed for him. Patient was educated on risk of dependence, addiction, and withdrawal symptoms from Valium and stated understanding. He was also educated on avoiding use of alcohol with Valium and risk of respiratory depression.  Total Time spent with patient: 15 minutes  Psychiatric Specialty Exam  Musculoskeletal: Strength & Muscle Tone: within normal limits Gait & Station: normal Patient leans: N/A  Psychiatric Specialty Exam: Physical Exam  Constitutional: He is oriented to person, place, and time. He appears well-developed and well-nourished.  Cardiovascular: Normal rate.  Respiratory: Effort normal.  Neurological: He is alert and oriented to person, place, and time.    Review of Systems  Constitutional: Negative.   Respiratory: Negative for cough and shortness of breath.   Cardiovascular: Negative for chest pain.  Psychiatric/Behavioral: Negative for agitation, behavioral problems, confusion, dysphoric mood, hallucinations, self-injury, sleep disturbance and suicidal ideas. The patient is nervous/anxious. The patient is not hyperactive.     Blood pressure (!) 152/78, pulse 82, temperature 98.3 F (36.8 C), temperature source Oral, resp. rate 18, SpO2 100 %.There is no height or weight on file to calculate BMI.  General Appearance: Casual  Eye Contact:  Good  Speech:  Normal Rate  Volume:  Normal  Mood:  Anxious  Affect:  Congruent  Thought Process:  Descriptions of Associations: Circumstantial  Orientation:  Full (Time, Place, and Person)  Thought Content:  Logical  Suicidal Thoughts:  No  Homicidal Thoughts:  No   Memory:  Immediate;   Good Recent;   Good Remote;   Good  Judgement:  Intact  Insight:  Fair  Psychomotor Activity:  Normal  Concentration:  Concentration: Fair and Attention Span: Fair  Recall:  Good  Fund of Knowledge:  Fair  Language:  Good  Akathisia:  No  Handed:  Right  AIMS (if indicated):     Assets:  Communication Skills Desire for Improvement Financial Resources/Insurance Housing Social Support  ADL's:  Intact  Cognition:  WNL  Sleep:       Blood pressure (!) 152/78, pulse 82, temperature 98.3 F (36.8 C), temperature source Oral, resp. rate 18, SpO2 100 %. There is no height or weight on file to calculate BMI.   Recommendations:  Based on my evaluation the patient does not appear to have an emergency medical condition.  Outpatient referrals.  Connye Burkitt, NP 03/15/2020, 4:24 PM

## 2020-03-16 ENCOUNTER — Telehealth (HOSPITAL_COMMUNITY): Payer: Self-pay | Admitting: Psychiatry

## 2020-03-21 ENCOUNTER — Ambulatory Visit: Payer: Medicare Other | Admitting: Allergy & Immunology

## 2020-03-24 ENCOUNTER — Telehealth: Payer: Self-pay | Admitting: Allergy & Immunology

## 2020-03-24 ENCOUNTER — Other Ambulatory Visit: Payer: Self-pay

## 2020-03-24 MED ORDER — ARFORMOTEROL TARTRATE 15 MCG/2ML IN NEBU: 15.0000 ug | INHALATION_SOLUTION | Freq: Two times a day (BID) | RESPIRATORY_TRACT | 2 refills | Status: DC

## 2020-03-24 MED ORDER — BUDESONIDE 0.5 MG/2ML IN SUSP: 0.5000 mg | Freq: Two times a day (BID) | RESPIRATORY_TRACT | 2 refills | Status: DC

## 2020-03-24 NOTE — Telephone Encounter (Signed)
Refills sent to [facility]. Patient notified.

## 2020-03-24 NOTE — Telephone Encounter (Signed)
Patient called and needs albuterol for his nebulizer and Brovana sent to [facility] on [address]. Patient states that it will be cheaper at this pharmacy. Patient would like to know when medications are called in.   Please advise.

## 2020-03-24 NOTE — Telephone Encounter (Signed)
Patient wife called back to see if we had called the meds in because he is almost out of his neb solution.

## 2020-03-25 ENCOUNTER — Telehealth: Payer: Self-pay | Admitting: Allergy & Immunology

## 2020-03-25 MED ORDER — ALBUTEROL SULFATE (2.5 MG/3ML) 0.083% IN NEBU
2.5000 mg | INHALATION_SOLUTION | Freq: Four times a day (QID) | RESPIRATORY_TRACT | 3 refills | Status: DC | PRN
Start: 2020-03-25 — End: 2020-07-21

## 2020-03-25 NOTE — Telephone Encounter (Signed)
Patient called me to tell me that he cannot get Brovona anywhere. His pharmacies that he has called all estimated that it will be next week. I recommended that he just do albuterol nebs 3-4 times daily until the [facility] comes in. He reports that he does not have any albuterol left. Therefore I sent in some to his pharmacy.  He also reports that he is having problems with chigger bites and "hives over [his] body". His wife is having similar problems. I offered an OV next week, but then he tells me that his wife wants to see me as a patient as well. Therefore I will ask [name] to call him next week to schedule those two appointments.   Patient in agreement with the plan.  [name], MD [facility] [place] [facility] [place]

## 2020-04-01 NOTE — Telephone Encounter (Signed)
[name] cannot even keep his regular follow-up and [place] appointments, so I do not know how he would keep weekly venom injection appointments.  In addition, his lung function is very terrible and any kind of immunotherapy can put patients over the edge into asthma exacerbations.  [name], MD [facility] [place] [facility] [place]

## 2020-04-01 NOTE — Telephone Encounter (Signed)
Called patient but received no answer.

## 2020-04-01 NOTE — Telephone Encounter (Signed)
Patient was able to get Brovana. He and his wife are still having issues with chigger bites as well as ticks. I did offer a new patient appt for wife a follow up for him. He did not accept yet. I told him that if he had just moved he could visit the local farm store and see what they would recommend as long as it did not bother his allergies. He then mentioned bee venom injections. I did explain the process to him and told him I would route a message to Dr. Ernst Bowler.

## 2020-04-04 NOTE — Telephone Encounter (Signed)
Called again and still did not receive an answer.

## 2020-04-08 NOTE — Telephone Encounter (Signed)
Patient scheduled for 05-06-20 to discuss further with Dr. Ernst Bowler.

## 2020-04-08 NOTE — Telephone Encounter (Signed)
No answer and unable to leave a message

## 2020-04-25 ENCOUNTER — Telehealth: Payer: Self-pay

## 2020-04-25 NOTE — Telephone Encounter (Signed)
03/16/20: Palliative volunteer check in call attempt, phone not in service

## 2020-04-29 ENCOUNTER — Telehealth: Payer: Self-pay | Admitting: Allergy & Immunology

## 2020-04-29 NOTE — Telephone Encounter (Signed)
I spoke with [name]. He is doing okay now. He feels worn out. He took 2 tablets of Benadryl total last night and that in conjunction with the other medications he took helped with his symptoms. He described it as a 1000 lb weight on his chest and chest tightness. He did take an extra dose of prednisone this morning. His rescue inhaler did not help. His ear is still red with no swelling. His arm has improved with the swelling gone down since last night. Still some redness and heat. I recommended an ice pack along with Tylenol or Ibuprofen per instruction on bottle. I told him whichever is okay for him to take. I let him know to seek medical care at either Urgent care or call 911 if he needs to be seen urgently. Patient verbalized understanding. Patient was up moving around and breathing well for him while I was on the phone.

## 2020-04-29 NOTE — Telephone Encounter (Signed)
[name] - can we call to check on him.   [name], MD [facility] [place] [facility] [place]

## 2020-04-29 NOTE — Telephone Encounter (Signed)
Thank you so much for giving him a call and taking such good care of time, Lonn [place]! He is lucky to have you on his Care Team! I completely agree with your plan. Please remind him that we have someone on call 24/7 in case he needs some more medical advice.   [name], MD Allergy and [place] [facility] [place]

## 2020-04-29 NOTE — Telephone Encounter (Signed)
Called patient. Spoke for a moment then I think he may have lost service as I had a hard time hearing him.

## 2020-04-29 NOTE — Telephone Encounter (Signed)
Patient called and said he was stung by 2 yellow jackets yesterday. He did not use his epi pen, but took Benadryl instead. After that he said he felt tired and he didn't feel good at all last night. He did his nebulizer machine. No, he said he feels out of it. Wants to know what he needs to do next.

## 2020-04-29 NOTE — Telephone Encounter (Signed)
Patient stated he was cutting the grass yesterday and got stung in the right ear and then in the back in the right shoulder and notice a yellow jacket. Patient went inside and waited on the couch with an ice pack. Patient took Benadryl 1 tablet while waiting, and then an hour later her took another one because of difficult breathing. Patient decided to get some rest and stated he woke up in the middle of the night with irregular heart symptoms and took his Nitroglycerin. Patient is still having SOB since waking up this morning and is taking Brovana and Budesonide and stated it help the breathing for a few minutes. He wants to know if he needs to be further evaluated and maybe experiencing som anxiety.

## 2020-04-29 NOTE — Telephone Encounter (Signed)
Patient is aware on on-call provider.

## 2020-05-02 NOTE — Telephone Encounter (Signed)
Noted!  Thank you for the update.  Tanesha Arambula, MD [facility] [place]  

## 2020-05-06 ENCOUNTER — Other Ambulatory Visit: Payer: Self-pay

## 2020-05-06 ENCOUNTER — Ambulatory Visit (INDEPENDENT_AMBULATORY_CARE_PROVIDER_SITE_OTHER): Payer: Medicare Other | Admitting: Allergy & Immunology

## 2020-05-06 ENCOUNTER — Encounter: Payer: Self-pay | Admitting: Allergy & Immunology

## 2020-05-06 DIAGNOSIS — K219 Gastro-esophageal reflux disease without esophagitis: Secondary | ICD-10-CM

## 2020-05-06 DIAGNOSIS — G4733 Obstructive sleep apnea (adult) (pediatric): Secondary | ICD-10-CM

## 2020-05-06 DIAGNOSIS — T63481D Toxic effect of venom of other arthropod, accidental (unintentional), subsequent encounter: Secondary | ICD-10-CM

## 2020-05-06 DIAGNOSIS — J3089 Other allergic rhinitis: Secondary | ICD-10-CM

## 2020-05-06 DIAGNOSIS — J449 Chronic obstructive pulmonary disease, unspecified: Secondary | ICD-10-CM | POA: Diagnosis not present

## 2020-05-06 DIAGNOSIS — J302 Other seasonal allergic rhinitis: Secondary | ICD-10-CM

## 2020-05-06 MED ORDER — BUDESONIDE 0.5 MG/2ML IN SUSP: 0.5000 mg | Freq: Two times a day (BID) | RESPIRATORY_TRACT | 2 refills | Status: DC
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
## 2020-05-06 NOTE — Patient Instructions (Addendum)
1. Severe persistent asthma with COPD overlap - We are not going to make any changes today.  - Daily controller medication(s): Pulmicort 0.5mg  (inhaled steroid) TWO TIMES DAILY + Brovana 76mcg TWO TIMES DAILY + Yupelri ONE TIME DAILY + montelukast 10 mg ONE TIME DAILY + prednisone 5mg  TWO TIMES DAILY - Prior to physical activity: ProAir 2 puffs 10-15 minutes before physical activity. - Rescue medications: albuterol nebulizer one vial every 4-6 hours as needed or DuoNeb nebulizer one vial every 4-6 hours as needed - Asthma control goals:  * Full participation in all desired activities (may need albuterol before activity) * Albuterol use two time or less a week on average (not counting use with activity) * Cough interfering with sleep two time or less a month * Oral steroids no more  than once a year * No hospitalizations  2. Perennial and seasonal allergic rhinitis - Continue with loratadine 10mg  daily as needed. - Continue with fluticasone 1-2 sprays per nostril daily as needed.   3. GERD - Continue with pantoprazole 40mg  daily.  - Monitor Tums usage.   4. Stinging insect anaphylaxis (honeybee, hornets, yellow jacket, wasp) - We can certainly consider venom immunotherapy, but at this point I think we should hold off.  - I would like to get your breathing under better control first.   5. Return in about 3 months (around 08/06/2020). This can be an in-person, a virtual Webex or a telephone follow up visit.   Please inform us of any Emergency Department visits, hospitalizations, or changes in symptoms. Call us before going to the ED for breathing or allergy symptoms since we might be able to fit you in for a sick visit. Feel free to contact us anytime with any questions, problems, or concerns.  It was a pleasure to talk to you today today!  Websites that have reliable patient information: 1. American Academy of Asthma, Allergy, and Immunology: www.aaaai.org 2. Food Allergy Research and  Education (FARE): foodallergy.org 3. Mothers of Asthmatics: http://www.asthmacommunitynetwork.org 4. American College of Allergy, Asthma, and Immunology: www.acaai.org   COVID-19 Vaccine Information can be found at: ShippingScam.co.uk For questions related to vaccine distribution or appointments, please email [email or web address] or call [phone].     Like [name] on [place] and Instagram for our latest updates!        Make sure you are registered to vote! If you have moved or changed any of your contact information, you will need to get this updated before voting!  In some cases, you MAY be able to register to vote online: [email or web address]

## 2020-05-09 ENCOUNTER — Encounter: Payer: Self-pay | Admitting: Allergy & Immunology

## 2020-05-12 ENCOUNTER — Telehealth: Payer: Self-pay | Admitting: Allergy & Immunology

## 2020-05-12 NOTE — Telephone Encounter (Signed)
Dr. Ernst Bowler do you see any issues with him receiving the vaccine?

## 2020-05-12 NOTE — Telephone Encounter (Signed)
Patient called and needs to talk with you about him getting the covid shot./[phone]

## 2020-05-12 NOTE — Telephone Encounter (Signed)
No I really do not. I believe that he has had a colonoscopy with a clean out, so PEG should not be an issue. We are going to be giving the vaccination at our clinic in the next 2-4 weeks, so we could put him on the list. This would be at [facility].    [name], MD Allergy and [place] [facility] [place]

## 2020-05-13 NOTE — Telephone Encounter (Signed)
Left detailed message for patient advising him of this. I also let him know he could call back with further questions.

## 2020-05-13 NOTE — Telephone Encounter (Signed)
Patient called back and spoke briefly with [place], [place]. She handed the call off to me to finish. I did explain the information that Dr. Ernst Bowler had for him. He told me that he did not like the other lady's attitude and he was now extremely upset (patient used an expletive instead of these words), told me that he would be finding a different doctor and hung up on me. I did make Dr. Ernst Bowler aware of the situation. I am routing to [place] so that she can add any notes.

## 2020-05-13 NOTE — Progress Notes (Signed)
Left detailed message advising patient of this.

## 2020-05-31 ENCOUNTER — Other Ambulatory Visit: Payer: Self-pay | Admitting: Allergy & Immunology

## 2020-05-31 NOTE — Telephone Encounter (Signed)
Patient called and said that he has not used the predisone for about 10 days and started back 2 days ago. And would like to up the dose because he doesn't feel good.. [phone]

## 2020-05-31 NOTE — Telephone Encounter (Signed)
Attempted to call patient and my call would not go through. Will try again later.

## 2020-05-31 NOTE — Telephone Encounter (Signed)
He is non-compliant. This does not surprise me at all. Ok to refill prednisone for three months only. He can burst his prednisone to 30mg  twice daily for five days and then go back to 5mg  1-2 times daily thereafter (his baseline). It might be easier to send two separate scripts in. Pended scripts. Please confirm pharmacy.   [name], MD Allergy and [facility] [place]

## 2020-06-02 ENCOUNTER — Telehealth: Payer: Self-pay | Admitting: *Deleted

## 2020-06-02 NOTE — Telephone Encounter (Signed)
Sounds good. I will be surprised if he shows up.  [name], MD Allergy and [place] [facility] [place]

## 2020-06-02 NOTE — Telephone Encounter (Signed)
L/M for patient that if he does want to try [place] he already has approval on file till November 20121 and gave number to contact [place] at [phone] to schedule shipment and make appt to bring Rx into office for restart.

## 2020-06-02 NOTE — Telephone Encounter (Signed)
-----   Message from Valentina Shaggy, MD sent at 05/09/2020  6:16 AM EDT ----- He wants to try Dupixent again. Can we get it approved with the steroid sparing indication? Thanks!

## 2020-06-06 MED ORDER — PREDNISONE 10 MG PO TABS: ORAL_TABLET | ORAL | 0 refills | Status: DC

## 2020-06-06 MED ORDER — PREDNISONE 5 MG PO TABS: 5.0000 mg | ORAL_TABLET | Freq: Two times a day (BID) | ORAL | 3 refills | Status: DC

## 2020-06-06 NOTE — Telephone Encounter (Signed)
Patient called back and verbalized understanding. He wanted prescriptions sent to [facility] on [address]. Prescriptions have been sent in.

## 2020-06-06 NOTE — Telephone Encounter (Signed)
Called and left a voicemail asking for patient to return call to discuss.  °

## 2020-06-28 ENCOUNTER — Telehealth: Payer: Self-pay | Admitting: Allergy & Immunology

## 2020-06-28 NOTE — Telephone Encounter (Signed)
Patient called and would like for someone to call back. He had a asthma attack on Friday afternoon and use his inhalers and then called dr bobbitt on call. He said that he has a lot of mucus and need help.. [facility] [place] . [phone].

## 2020-06-28 NOTE — Telephone Encounter (Signed)
Dr. Gallagher? 

## 2020-06-29 ENCOUNTER — Ambulatory Visit
Admission: EM | Admit: 2020-06-29 | Discharge: 2020-06-29 | Disposition: A | Discharge status: Home / Self Care | Payer: Medicare Other | Attending: Emergency Medicine | Admitting: Emergency Medicine

## 2020-06-29 ENCOUNTER — Other Ambulatory Visit: Payer: Self-pay

## 2020-06-29 ENCOUNTER — Encounter: Payer: Self-pay | Admitting: Emergency Medicine

## 2020-06-29 DIAGNOSIS — R110 Nausea: Secondary | ICD-10-CM

## 2020-06-29 DIAGNOSIS — R682 Dry mouth, unspecified: Secondary | ICD-10-CM

## 2020-06-29 DIAGNOSIS — K5900 Constipation, unspecified: Secondary | ICD-10-CM | POA: Diagnosis not present

## 2020-06-29 DIAGNOSIS — Z1152 Encounter for screening for COVID-19: Secondary | ICD-10-CM

## 2020-06-29 LAB — POCT URINALYSIS DIP (MANUAL ENTRY)
Bilirubin, UA: NEGATIVE
Blood, UA: NEGATIVE
Glucose, UA: NEGATIVE mg/dL
Ketones, POC UA: NEGATIVE mg/dL
Leukocytes, UA: NEGATIVE
Nitrite, UA: NEGATIVE
Protein Ur, POC: 30 mg/dL — AB
Spec Grav, UA: >=1.03 — AB (ref 1.010–1.025)
Urobilinogen, UA: 0.2 E.U./dL
pH, UA: 5.5 (ref 5.0–8.0)

## 2020-06-29 LAB — POCT FASTING CBG KUC MANUAL ENTRY: POCT Glucose (KUC): 122 mg/dL — AB (ref 70–99)

## 2020-06-29 MED ORDER — ONDANSETRON 4 MG PO TBDP
4.0000 mg | ORAL_TABLET | Freq: Once | ORAL | Status: AC
  Administered 2020-06-29: 4 mg via ORAL

## 2020-06-29 MED ORDER — ONDANSETRON 4 MG PO TBDP
4.0000 mg | ORAL_TABLET | Freq: Three times a day (TID) | ORAL | 0 refills | Status: DC | PRN
Start: 2020-06-29 — End: 2020-07-01

## 2020-06-29 NOTE — Telephone Encounter (Signed)
I agree that he definitely needs to go to the hospital.  He probably needs IV fluids at the very least.  [name], MD Allergy and [place] [facility]

## 2020-06-29 NOTE — Discharge Instructions (Addendum)
Drink plenty of water. Will call you with blood results tomorrow. Go to ER in the mean time for worsening symptoms, fever, chest pain.

## 2020-06-29 NOTE — ED Triage Notes (Signed)
Pt has been constipated for about 6 months.  Pt had bleeding in the toilet with bowel movement.

## 2020-06-29 NOTE — ED Provider Notes (Signed)
[name]    CSN: [id number] Arrival date & time: 06/29/20  1537      History   Chief Complaint Chief Complaint  Patient presents with  . dry mouth    HPI [name] is a 69 y.o. male  With extensive medical history as outlined below presenting for numerous concerns.  Has noted intermittent dry mouth "for months ".  States dry mouth was worse this morning.  Denies polydipsia, polyphagia, polyuria, abdominal pain, nausea or vomiting.  States he called his PCP this morning and they recommended he go to the ER for fluids as his "kidneys are shutting down ".  Does have mild right low back pain without injury, saddle anesthesia, urinary retention or fecal incontinence.  No lower leg weakness or numbness.  Patient does admit to recent increase in prednisone as recommended by his pulmonologist.  Has not undergone Covid testing: No increased dyspnea, lower leg swelling, chest pain or palpitations.  Patient also notes chronic constipation x6 months.   Past Medical History:  Diagnosis Date  . Anxiety disorder   . Arthritis    BILATERAL SHOULDERS, ELBOWS AND HANDS AND LEFT HIP AND KNEES--HX CORTISONE SHOTS IN SHOULDERS, ELBOWS, HIP AND KNEES  . Basal cell carcinoma of back 04/30/2016   Dermatologist- Dr Nevada Crane;   MOHS sx- Dr Levada Dy   . Bladder outlet obstruction   . Chronic idiopathic constipation 05/08/2015  . Complication of anesthesia    DIFFICULT WAKING   . COPD (chronic obstructive pulmonary disease) Gold C Frequent exacerbations 01/13/2014   [name] 07/08/14: FeV1 51% FeV1/FVC 66% FVC 59% 10/5/2015ONO RA was normal  10/13/2014  ONO on RA NORMAL   . COPD, frequent exacerbations ([place])    pulmologist-  dr Joya Gaskins--  G[place]a Rm Stage C.04-25-15 recent COPD exacerbation-much improved now, after tx. in ER [facility].  Marland Kitchen Depression   . Diabetes mellitus without complication ([place])    BODERLINE - DIET CONTROL  . Dysrhythmia    PVC'S  . Emphysema lung ([place])    stage 2  . Family  history of adverse reaction to anesthesia    father would wake up with agitation   . Former smoker 01/13/2014  . Gastroesophageal reflux disease without esophagitis   . Heavy alcohol use 04/30/2016  . History of chronic bronchitis   . History of oxygen administration    oxygen use 2 l/m nasally at bedtime and exertional occasions  . History of rheumatic fever   . History of TB (tuberculosis)    1984--  hospitalized for 4 month treatment  . History of urinary retention   . Hx of multiple concussions    x 2 per patient   . Hypertension   . Melanoma ([place])   . Nocturnal oxygen desaturation    USES O2 NIGHTLY  . OSA (obstructive sleep apnea) 01/13/2014  . PONV (postoperative nausea and vomiting)   . Post-polio syndrome    polio at age 89--PT WAS IN IRON LUNG; PT WAS IN W/C UNTIL AGE 89; STILL HAS WEAKNESS RIGHT SIDE  . Prostate cancer ([place])   . Schizophrenia ([place])   . Tuberculosis    Hosp 4 months rx , left early   . Urticaria     Patient Active Problem List   Diagnosis Date Noted  . COPD with acute exacerbation ([place]) 12/04/2018  . PSVT (paroxysmal supraventricular tachycardia) ([place]) 09/23/2018  . Seasonal and perennial allergic rhinitis 07/30/2018  . Asthma-COPD overlap syndrome ([place]) 07/07/2018  . S/P arthroscopy of right shoulder  04/25/2017  . Generalized anxiety disorder 04/22/2017  . Severe persistent asthma without complication 62/22/9798  . Bee sting-induced anaphylaxis 02/12/2017  . Anaphylactic reaction due to food, initial encounter 02/12/2017  . Current use of beta blocker 02/12/2017  . Allergic rhinitis 01/15/2017  . COPD exacerbation ([place]) 11/04/2016  . Asthma exacerbation 11/03/2016  . Nausea without vomiting 09/17/2016  . Shortness of breath   . Diabetes mellitus type 2 in obese ([place]) 06/26/2016  . non-specific Chest pain 06/12/2016  . Patient's noncompliance with other medical treatment and regimen 06/02/2016  . Hyperlipidemia, mixed 06/02/2016  . Elevated liver  enzymes  06/02/2016  . Schizophrenia ([place]) 04/30/2016  . Environmental and seasonal allergies 04/30/2016  . Diabetes mellitus without complication- (diet controlled; w/o proteinuria) 04/30/2016  . Heavy alcohol use- Many yrs  04/30/2016  . Basal cell carcinoma of back 04/30/2016  . Obesity 04/30/2016  . Hx of multiple concussions 04/30/2016  . h/o Vitamin D deficiency 04/30/2016  . Screen for sexually transmitted diseases 04/30/2016  . Immunocompromised state ([place]) 04/30/2016  . Cervical radiculopathy 01/15/2016  . Alcohol withdrawal ([place]) 11/22/2015  . Alcohol abuse with alcohol-induced mood disorder ([place]) 11/22/2015  . h/o Suicidal thoughts 11/21/2015  . Dry mouth 10/07/2015  . Anxiety 09/19/2015  . OAB (overactive bladder) 07/27/2015  . Chronic idiopathic constipation 05/08/2015  . Screening for colon cancer   . Benign neoplasm of cecum   . Benign neoplasm of ascending colon   . Benign neoplasm of transverse colon   . Benign neoplasm of sigmoid colon   . Rectal polyp   . Gastroesophageal reflux disease without esophagitis   . Esophageal stricture   . Joint stiffness of multiple sites 10/17/2014  . Benign localized hyperplasia of prostate with urinary obstruction 09/29/2014  . concerns for memory loss 09/13/2014  . Tuberculosis   . Diverticulum of bladder 06/17/2014  . Acute urinary retention 05/31/2014  . Neuropathy ([place]) 04/11/2014  . Post-polio syndrome 01/13/2014  . Other emphysema ([place]) 01/13/2014  . Hypertension 01/13/2014  . Former smoker 01/13/2014  . h/o Prostate cancer 01/13/2014  . History of tuberculosis 01/13/2014  . Bipolar disorder ([place]) 01/13/2014  . Explosive personality disorder ([place]) 01/13/2014  . Erectile dysfunction 01/13/2014  . Nephrolithiasis 01/13/2014  . Chronic pain 01/13/2014  . Obstructive sleep apnea 01/13/2014  . Alcoholic gastritis 92/08/9416  . Nocturnal hypoxia 08/10/2013    Past Surgical History:  Procedure Laterality Date  .  CARDIOVASCULAR STRESS TEST  06-08-2014  dr Mare Ferrari   normal lexiscan study/  no ischemia/  not gated due to PAC's  . COLONOSCOPY N/A 05/03/2015   Procedure: COLONOSCOPY;  Surgeon: [name], MD;  Location: [facility];  Service: Endoscopy;  Laterality: N/A;  . CYSTOSCOPY N/A 10/25/2015   Procedure: CYSTOSCOPY;  Surgeon: [name], MD;  Location: [facility];  Service: Urology;  Laterality: N/A;  . CYSTOSCOPY W/ CYSTOGRAM/  TRANSRECTAL ULTRASOUND PROSTATE BX  03-22-2009  . ESOPHAGOGASTRODUODENOSCOPY N/A 03/22/2015   Procedure: ESOPHAGOGASTRODUODENOSCOPY (EGD) with dilation;  Surgeon: [name], MD;  Location: [facility];  Service: Endoscopy;  Laterality: N/A;  . excision of skin lesion    . LAPAROSCOPIC CHOLECYSTECTOMY  2013  . left elbow surgery      due to fracture   . NASAL SEPTUM SURGERY  2000  . OTHER SURGICAL HISTORY      Muscle & bone Graft/Polio  . polio surgeries      14 polio surgeries   . PROSTATE BIOPSY N/A 09/28/2014   Procedure:  PROSTATE ULTRASOUND/BIOPSY;  Surgeon: [name], MD;  Location: [facility];  Service: Urology;  Laterality: N/A;  . PROSTATE BIOPSY N/A 10/25/2015   Procedure: PROSTATE BIOPSY AND ULTRASOUND;  Surgeon: [name], MD;  Location: [facility];  Service: Urology;  Laterality: N/A;  . SAVORY DILATION N/A 03/22/2015   Procedure: SAVORY DILATION;  Surgeon: [name], MD;  Location: [facility];  Service: Endoscopy;  Laterality: N/A;  . SHOULDER ARTHROSCOPY WITH OPEN ROTATOR CUFF REPAIR Bilateral 2013  &  1999   removal spurs and bursectomy  . TRANSURETHRAL INCISION OF BLADDER NECK N/A 10/25/2015   Procedure:  TRANSURETHRAL INCISION OF BLADDER NECK;  Surgeon: [name], MD;  Location: [facility];  Service: Urology;  Laterality: N/A;  . TRANSURETHRAL RESECTION OF PROSTATE N/A 09/28/2014   Procedure: TRANSURETHRAL RESECTION OF THE PROSTATE (TURP);  Surgeon: [name], MD;  Location: [facility];  Service: Urology;  Laterality: N/A;  . URETEROSOPY STONE EXTRACTION   2000       Home Medications    Prior to Admission medications   Medication Sig Start Date End Date Taking? Authorizing Provider  albuterol (PROVENTIL) (2.5 MG/3ML) 0.083% nebulizer solution Take 3 mLs (2.5 mg total) by nebulization every 6 (six) hours as needed for wheezing or shortness of breath. 03/25/20   Valentina Shaggy, MD  albuterol (VENTOLIN HFA) 108 (90 Base) MCG/ACT inhaler Inhale 2 puffs into the lungs every 6 (six) hours as needed for wheezing or shortness of breath. 01/18/20   Valentina Shaggy, MD  arformoterol (BROVANA) 15 MCG/2ML NEBU Take 2 mLs (15 mcg total) by nebulization 2 (two) times daily. 03/24/20   Kennith Gain, MD  baclofen (LIORESAL) 10 MG tablet Take 10 mg by mouth daily. 08/25/19   [provider]  budesonide (PULMICORT) 0.5 MG/2ML nebulizer solution Take 2 mLs (0.5 mg total) by nebulization 2 (two) times daily. 05/06/20   Valentina Shaggy, MD  colchicine 0.6 MG tablet TK 2 TS PO INITIALLY THEN TK 1 T IN 1 HOUR 09/20/19   [provider]  diazepam (VALIUM) 10 MG tablet Take 10 mg by mouth daily.    [provider]  diphenhydrAMINE (SOMINEX) 25 MG tablet Take by mouth.    [provider]  EPINEPHrine (EPIPEN 2-PAK) 0.3 mg/0.3 mL IJ SOAJ injection Inject 0.3 mLs (0.3 mg total) into the muscle as needed for anaphylaxis. 07/03/19   Valentina Shaggy, MD  ipratropium-albuterol (DUONEB) 0.5-2.5 (3) MG/3ML SOLN Take 3 mLs by nebulization every evening. 01/21/20   Valentina Shaggy, MD  loratadine (CLARITIN) 10 MG tablet Take 1 tablet (10 mg total) by mouth daily as needed for allergies. 01/18/20   Valentina Shaggy, MD  nitroGLYCERIN (NITROSTAT) 0.4 MG SL tablet Place 1 tablet (0.4 mg total) under the tongue every 5 (five) minutes as needed for chest pain. 06/21/15   Brunetta Jeans, PA-C  ondansetron (ZOFRAN ODT) 4 MG disintegrating tablet Take 1 tablet (4 mg total) by mouth every 8 (eight) hours as needed  for up to 4 doses for nausea or vomiting. 06/29/20   Hall-Potvin, [place], PA-C  OXYGEN Inhale 2-3 L into the lungs at bedtime. DURING THE DAY IF NEEDED Reported on 11/25/2015    [provider]  pantoprazole (PROTONIX) 40 MG tablet Take 1 tablet (40 mg total) by mouth daily. 01/18/20   Valentina Shaggy, MD  predniSONE (DELTASONE) 10 MG tablet Take 3 tabs (96m) twice daily for five days. Then STOP. 06/06/20  Valentina Shaggy, MD  predniSONE (DELTASONE) 5 MG tablet Take 1 tablet (5 mg total) by mouth 2 (two) times daily. 06/06/20   Valentina Shaggy, MD  Respiratory Therapy Supplies (NEBULIZER/TUBING/MOUTHPIECE) KIT 1 each by Does not apply route as directed. 07/24/19   Valentina Shaggy, MD    Family History Family History  Problem Relation Age of Onset  . Alzheimer's disease Father 4       Deceased  . Stomach cancer Father   . Heart attack Father   . Heart disease Father   . Skin cancer Mother        Facial-Living  . Alcohol abuse Sister        x2  . Mental illness Sister        x2  . Alcohol abuse Sister   . Diabetes Maternal Aunt        x2  . Thyroid disease Maternal Aunt        x4  . Diabetes Maternal Uncle   . Tuberculosis Paternal Grandfather   . Tuberculosis Paternal Grandmother   . Alzheimer's disease Paternal Aunt   . Alzheimer's disease Paternal Uncle   . Colon cancer Neg Hx   . Colon polyps Neg Hx   . Crohn's disease Neg Hx   . Ulcerative colitis Neg Hx   . Allergic rhinitis Neg Hx   . Angioedema Neg Hx   . Asthma Neg Hx   . Eczema Neg Hx   . Urticaria Neg Hx     Social History Social History   Tobacco Use  . Smoking status: Former Smoker    Years: 47.00    Types: Pipe    Quit date: 08/25/2013    Years since quitting: 6.8  . Smokeless tobacco: Former Systems developer    Types: Chew  . Tobacco comment: 5-6 times smoking a pipe  Vaping Use  . Vaping Use: Never used  Substance Use Topics  . Alcohol use: Yes    Alcohol/week: 14.0 standard  drinks    Types: 14 Cans of beer per week    Comment: 2 cans daily  . Drug use: No     Allergies   Aspirin, Hornet venom, Ivp dye [iodinated diagnostic agents], Levaquin [levofloxacin in d5w], Nsaids, Penicillins, Tolmetin, Fasenra [benralizumab], Perforomist [formoterol], Buprenorphine hcl, Morphine and related, and Oxycodone   Review of Systems As per HPI   Physical Exam Triage Vital Signs ED Triage Vitals  Enc Vitals Group     BP 06/29/20 1550 (!) 150/77     Pulse Rate 06/29/20 1550 96     Resp 06/29/20 1550 16     Temp 06/29/20 1550 98.4 F (36.9 C)     Temp Source 06/29/20 1550 Oral     SpO2 06/29/20 1550 94 %     Weight 06/29/20 1553 218 lb (98.9 kg)     Height --      Head Circumference --      Peak Flow --      Pain Score 06/29/20 1551 0     Pain Loc --      Pain Edu? --      Excl. in [place]? --    No data found.  Updated Vital Signs BP (!) 150/77 (BP Location: Left Arm)   Pulse 96   Temp 98.4 F (36.9 C) (Oral)   Resp 16   Wt 218 lb (98.9 kg)   SpO2 94%   BMI 29.57 kg/m   Visual Acuity Right Eye Distance:  Left Eye Distance:   Bilateral Distance:    Right Eye Near:   Left Eye Near:    Bilateral Near:     Physical Exam Constitutional:      General: He is not in acute distress.    Appearance: He is not toxic-appearing.  HENT:     Head: Normocephalic and atraumatic.     Mouth/Throat:     Mouth: Mucous membranes are moist.     Pharynx: Oropharynx is clear.  Eyes:     General: No scleral icterus.    Pupils: Pupils are equal, round, and reactive to light.  Cardiovascular:     Rate and Rhythm: Normal rate and regular rhythm.  Pulmonary:     Effort: Pulmonary effort is normal. No respiratory distress.     Breath sounds: No wheezing or rales.  Abdominal:     Palpations: Abdomen is soft.     Tenderness: There is no abdominal tenderness.  Skin:    Coloration: Skin is not jaundiced or pale.  Neurological:     Mental Status: He is alert and  oriented to person, place, and time.      UC Treatments / Results  Labs (all labs ordered are listed, but only abnormal results are displayed) Labs Reviewed  POCT FASTING CBG [facility] MANUAL ENTRY - Abnormal; Notable for the following components:      Result Value   POCT Glucose ([facility]) 122 (*)    All other components within normal limits  POCT URINALYSIS DIP (MANUAL ENTRY) - Abnormal; Notable for the following components:   Spec Grav, UA >=1.030 (*)    Protein Ur, POC =30 (*)    All other components within normal limits  NOVEL CORONAVIRUS, NAA  COMPREHENSIVE METABOLIC PANEL  CBC WITH DIFFERENTIAL/PLATELET  AMYLASE  LIPASE    EKG   Radiology No results found.  Procedures Procedures (including critical care time)  Medications Ordered in UC Medications  ondansetron (ZOFRAN-ODT) disintegrating tablet 4 mg (4 mg Oral Given 06/29/20 1724)    Initial Impression / Assessment and Plan / UC Course  I have reviewed the triage vital signs and the nursing notes.  Pertinent labs & imaging results that were available during my care of the patient were reviewed by me and considered in my medical decision making (see chart for details).     Afebrile, nontoxic, hemodynamically stable with moist mucous membranes.  Low concern for acute dehydration.  Most of patient's complaints likely related to acute on chronic conditions.  CBG 122.  Tolerated Zofran with relief of nausea.  No chest pain, palpitations, shortness of breath, and cardiopulmonary exam unremarkable at this time.  Patient requesting Covid testing: Obtained.  Will quarantine until results are back.  In effort to keep patient out of the ER, will obtain basic labs.  Discussed that if these are abnormal, suggestive of AKI, he would need to present to ER for further evaluation and management of symptoms.  Patient is agreeable to this.  Return precautions discussed, pt verbalized understanding and is agreeable to plan. Final Clinical  Impressions(s) / UC Diagnoses   Final diagnoses:  Encounter for screening for COVID-19  Nausea without vomiting  Constipation, unspecified constipation type  Dry mouth     Discharge Instructions     Drink plenty of water. Will call you with blood results tomorrow. Go to ER in the mean time for worsening symptoms, fever, chest pain.    ED Prescriptions    Medication Sig Dispense Auth. Provider   ondansetron (  ZOFRAN ODT) 4 MG disintegrating tablet Take 1 tablet (4 mg total) by mouth every 8 (eight) hours as needed for up to 4 doses for nausea or vomiting. 4 tablet Hall-Potvin, [place], PA-C     PDMP not reviewed this encounter.   Hall-Potvin, [place], [place] 06/29/20 1747

## 2020-06-29 NOTE — ED Triage Notes (Signed)
Dry mouth on and off.  This morning the dry mouth was worse. Called PCP this morning and they said his kidneys were shutting down that he needed to come in for fluids.

## 2020-06-29 NOTE — Telephone Encounter (Signed)
Patient called the office this morning.  Spoke with patient.  Patient has been having nausea, fever with temps ranging from 99.7-101.3, shortness of breath, faint and dizzy and asthma flares since last Thursday.  Patient is only able to walk 3-4 steps and gets short of breath. Patient does not have pulse oximeter at home.  Patient went to [facility] 2 days and was dehydrated.  Per patient he has been taking all of his medications as directed. Patient called PCP 20 minutes before calling our office and was told his kidneys were shutting down.  Patient states he does have little urine output and it is very dark. Patient is home alone at present moment, but wife is on her way to be with him. Stressed to patient he needed to be tested for Covid and fully evaluated at hospital.    Instructed patient to call 911 for immediate EMS evaluation and transport. Patient voiced understanding and was going to call 911 as soon as we hung up.

## 2020-06-30 LAB — COMPREHENSIVE METABOLIC PANEL
ALT: 69 IU/L — ABNORMAL HIGH (ref 0–44)
AST: 58 IU/L — ABNORMAL HIGH (ref 0–40)
Albumin/Globulin Ratio: 1.6 (ref 1.2–2.2)
Albumin: 4.6 g/dL (ref 3.8–4.8)
Alkaline Phosphatase: 76 IU/L (ref 44–121)
BUN/Creatinine Ratio: 13 (ref 10–24)
BUN: 16 mg/dL (ref 8–27)
Bilirubin Total: 0.6 mg/dL (ref 0.0–1.2)
CO2: 24 mmol/L (ref 20–29)
Calcium: 8.9 mg/dL (ref 8.6–10.2)
Chloride: 92 mmol/L — ABNORMAL LOW (ref 96–106)
Creatinine, Ser: 1.23 mg/dL (ref 0.76–1.27)
GFR calc Af Amer: 68 mL/min/{1.73_m2} (ref >59)
GFR calc non Af Amer: 59 mL/min/{1.73_m2} — ABNORMAL LOW (ref >59)
Globulin, Total: 2.9 g/dL (ref 1.5–4.5)
Glucose: 73 mg/dL (ref 65–99)
Potassium: 4.4 mmol/L (ref 3.5–5.2)
Sodium: 133 mmol/L — ABNORMAL LOW (ref 134–144)
Total Protein: 7.5 g/dL (ref 6.0–8.5)

## 2020-06-30 LAB — CBC WITH DIFFERENTIAL/PLATELET
Basophils Absolute: 0 10*3/uL (ref 0.0–0.2)
Basos: 1 %
EOS (ABSOLUTE): 0 10*3/uL (ref 0.0–0.4)
Eos: 0 %
Hematocrit: 47.8 % (ref 37.5–51.0)
Hemoglobin: 16.6 g/dL (ref 13.0–17.7)
Immature Grans (Abs): 0 10*3/uL (ref 0.0–0.1)
Immature Granulocytes: 0 %
Lymphocytes Absolute: 0.6 10*3/uL — ABNORMAL LOW (ref 0.7–3.1)
Lymphs: 19 %
MCH: 32.1 pg (ref 26.6–33.0)
MCHC: 34.7 g/dL (ref 31.5–35.7)
MCV: 93 fL (ref 79–97)
Monocytes Absolute: 0.4 10*3/uL (ref 0.1–0.9)
Monocytes: 11 %
Neutrophils Absolute: 2.2 10*3/uL (ref 1.4–7.0)
Neutrophils: 69 %
Platelets: 166 10*3/uL (ref 150–450)
RBC: 5.17 x10E6/uL (ref 4.14–5.80)
RDW: 12.4 % (ref 11.6–15.4)
WBC: 3.2 10*3/uL — ABNORMAL LOW (ref 3.4–10.8)

## 2020-06-30 LAB — LIPASE: Lipase: 42 U/L (ref 13–78)

## 2020-06-30 LAB — AMYLASE: Amylase: 47 U/L (ref 31–110)

## 2020-07-01 ENCOUNTER — Ambulatory Visit
Admission: EM | Admit: 2020-07-01 | Discharge: 2020-07-01 | Disposition: A | Discharge status: Home / Self Care | Payer: Medicare Other | Attending: Physician Assistant | Admitting: Physician Assistant

## 2020-07-01 DIAGNOSIS — U071 COVID-19: Secondary | ICD-10-CM

## 2020-07-01 LAB — SARS-COV-2, NAA 2 DAY TAT

## 2020-07-01 LAB — NOVEL CORONAVIRUS, NAA: SARS-CoV-2, NAA: DETECTED — AB

## 2020-07-01 MED ORDER — ARFORMOTEROL TARTRATE 15 MCG/2ML IN NEBU: 15.0000 ug | INHALATION_SOLUTION | Freq: Two times a day (BID) | RESPIRATORY_TRACT | 2 refills | Status: DC

## 2020-07-01 MED ORDER — ALBUTEROL SULFATE HFA 108 (90 BASE) MCG/ACT IN AERS: 2.0000 | INHALATION_SPRAY | Freq: Four times a day (QID) | RESPIRATORY_TRACT | 2 refills | Status: DC | PRN

## 2020-07-01 MED ORDER — ONDANSETRON 4 MG PO TBDP
4.0000 mg | ORAL_TABLET | Freq: Two times a day (BID) | ORAL | 0 refills | Status: DC
Start: 2020-07-01 — End: 2020-07-06

## 2020-07-01 NOTE — Addendum Note (Signed)
Addended by: Valentina Shaggy on: 07/01/2020 01:29 PM   Modules accepted: Orders

## 2020-07-01 NOTE — Discharge Instructions (Addendum)
Infusion clinic has been contacted. Zofran as needed. Follow up with [place] [facility]. Otherwise, if having fever, shortness of breath, go to the emergency department for further evaluation.

## 2020-07-01 NOTE — Telephone Encounter (Signed)
Patient was seen at Urgent Care 06-29-20. Note is available in Epic.

## 2020-07-01 NOTE — Telephone Encounter (Signed)
I called patient and spoke with him some about his positive results. Patient informed me that he is not sure he will survive this because off the health issues that he has. I told him that he has a very good chance and to stay in good spirits as possible. He did complain of right sided kidney pain and severe dry mouth. He is drinking propel water as much as he can. I did attempt to give him the information from the MD but he had a guest come in and ended the call before I was able.

## 2020-07-01 NOTE — Telephone Encounter (Signed)
Patient's wife called over the lunch hour.  They are willing to go to get antibiotic infusion.  They did call the antibody number and they requested a copy of his test results.  They are available in Epic, but I took a screenshot of the email that to the patient's wife and they would have been a hand.  He was wondering about the antibiotic infusion and what my opinion on it was.  I told him that it decreases hospitalizations by 75%, so I would strongly recommended.  He is worried about his reactions to it because he tends to react to any biologic that we give him.  He has so many shots it was, and I told him this was an infusion so they would start an IV.  I asked him to let them know that he is prone to allergic reactions and they could slow down the infusion rate.  However, I would under no circumstances recommend that he NOT get it.  He understood.  He also requested refills of his albuterol and Brovana. Prescriptions sent.   [name], MD [facility]

## 2020-07-01 NOTE — ED Triage Notes (Signed)
Pt states notified today he is COVID positive. States having SOB and nausea. States out of zofran. Pt speaking in complete sentences. Pt states his allergist states he needs the infusion asap.

## 2020-07-01 NOTE — ED Provider Notes (Addendum)
[name]    CSN: [id number] Arrival date & time: 07/01/20  1404      History   Chief Complaint Chief Complaint  Patient presents with   Shortness of Breath    HPI [name] is a 69 y.o. male.   69 year old male with history of anxiety, COPD comes in after being diagnosed with COVID 2 days ago. States was told by allergist he needed infusion ASAP. No changes to symptoms compared to 2 days ago. No increase to baseline dyspnea. No fever. Wanted to return for reassurances.      Past Medical History:  Diagnosis Date   Anxiety disorder    Arthritis    BILATERAL SHOULDERS, ELBOWS AND HANDS AND LEFT HIP AND KNEES--HX CORTISONE SHOTS IN SHOULDERS, ELBOWS, HIP AND KNEES   Basal cell carcinoma of back 04/30/2016   Dermatologist- Dr Nevada Crane;   MOHS sx- Dr Levada Dy    Bladder outlet obstruction    Chronic idiopathic constipation 5/32/9924   Complication of anesthesia    DIFFICULT WAKING    COPD (chronic obstructive pulmonary disease) Gold C Frequent exacerbations 01/13/2014   [name] 07/08/14: FeV1 51% FeV1/FVC 66% FVC 59% 10/5/2015ONO RA was normal  10/13/2014  ONO on RA NORMAL    COPD, frequent exacerbations ([place])    pulmologist-  dr Joya Gaskins--  G[place]a Rm Stage C.04-25-15 recent COPD exacerbation-much improved now, after tx. in ER [facility].   Depression    Diabetes mellitus without complication ([place])    BODERLINE - DIET CONTROL   Dysrhythmia    PVC'S   Emphysema lung ([place])    stage 2   Family history of adverse reaction to anesthesia    father would wake up with agitation    Former smoker 01/13/2014   Gastroesophageal reflux disease without esophagitis    Heavy alcohol use 04/30/2016   History of chronic bronchitis    History of oxygen administration    oxygen use 2 l/m nasally at bedtime and exertional occasions   History of rheumatic fever    History of TB (tuberculosis)    1984--  hospitalized for 4 month treatment   History of urinary  retention    Hx of multiple concussions    x 2 per patient    Hypertension    Melanoma ([place])    Nocturnal oxygen desaturation    USES O2 NIGHTLY   OSA (obstructive sleep apnea) 01/13/2014   PONV (postoperative nausea and vomiting)    Post-polio syndrome    polio at age 38--PT WAS IN IRON LUNG; PT WAS IN W/C UNTIL AGE 70; STILL HAS WEAKNESS RIGHT SIDE   Prostate cancer ([place])    Schizophrenia ([place])    Tuberculosis    Hosp 4 months rx , left early    Urticaria     Patient Active Problem List   Diagnosis Date Noted   COPD with acute exacerbation ([place]) 12/04/2018   PSVT (paroxysmal supraventricular tachycardia) ([employer]) 09/23/2018   Seasonal and perennial allergic rhinitis 07/30/2018   Asthma-COPD overlap syndrome ([place]) 07/07/2018   S/P arthroscopy of right shoulder 04/25/2017   Generalized anxiety disorder 04/22/2017   Severe persistent asthma without complication 26/83/4196   Bee sting-induced anaphylaxis 02/12/2017   Anaphylactic reaction due to food, initial encounter 02/12/2017   Current use of beta blocker 02/12/2017   Allergic rhinitis 01/15/2017   COPD exacerbation ([place]) 11/04/2016   Asthma exacerbation 11/03/2016   Nausea without vomiting 09/17/2016   Shortness of breath    Diabetes mellitus  type 2 in obese ([place]) 06/26/2016   non-specific Chest pain 06/12/2016   Patient's noncompliance with other medical treatment and regimen 06/02/2016   Hyperlipidemia, mixed 06/02/2016   Elevated liver enzymes  06/02/2016   Schizophrenia ([place]) 04/30/2016   Environmental and seasonal allergies 04/30/2016   Diabetes mellitus without complication- (diet controlled; w/o proteinuria) 04/30/2016   Heavy alcohol use- Many yrs  04/30/2016   Basal cell carcinoma of back 04/30/2016   Obesity 04/30/2016   Hx of multiple concussions 04/30/2016   h/o Vitamin D deficiency 04/30/2016   Screen for sexually transmitted diseases 04/30/2016   Immunocompromised  state ([place]) 04/30/2016   Cervical radiculopathy 01/15/2016   Alcohol withdrawal ([place]) 11/22/2015   Alcohol abuse with alcohol-induced mood disorder ([place]) 11/22/2015   h/o Suicidal thoughts 11/21/2015   Dry mouth 10/07/2015   Anxiety 09/19/2015   OAB (overactive bladder) 07/27/2015   Chronic idiopathic constipation 05/08/2015   Screening for colon cancer    Benign neoplasm of cecum    Benign neoplasm of ascending colon    Benign neoplasm of transverse colon    Benign neoplasm of sigmoid colon    Rectal polyp    Gastroesophageal reflux disease without esophagitis    Esophageal stricture    Joint stiffness of multiple sites 10/17/2014   Benign localized hyperplasia of prostate with urinary obstruction 09/29/2014   concerns for memory loss 09/13/2014   Tuberculosis    Diverticulum of bladder 06/17/2014   Acute urinary retention 05/31/2014   Neuropathy ([place]) 04/11/2014   Post-polio syndrome 01/13/2014   Other emphysema ([place]) 01/13/2014   Hypertension 01/13/2014   Former smoker 01/13/2014   h/o Prostate cancer 01/13/2014   History of tuberculosis 01/13/2014   Bipolar disorder ([place]) 01/13/2014   Explosive personality disorder ([place]) 01/13/2014   Erectile dysfunction 01/13/2014   Nephrolithiasis 01/13/2014   Chronic pain 01/13/2014   Obstructive sleep apnea 40/07/2724   Alcoholic gastritis 36/64/4034   Nocturnal hypoxia 08/10/2013    Past Surgical History:  Procedure Laterality Date   CARDIOVASCULAR STRESS TEST  06-08-2014  dr Mare Ferrari   normal lexiscan study/  no ischemia/  not gated due to PAC's   COLONOSCOPY N/A 05/03/2015   Procedure: COLONOSCOPY;  Surgeon: [name], MD;  Location: [facility];  Service: Endoscopy;  Laterality: N/A;   CYSTOSCOPY N/A 10/25/2015   Procedure: CYSTOSCOPY;  Surgeon: [name], MD;  Location: [facility];  Service: Urology;  Laterality: N/A;   CYSTOSCOPY W/ CYSTOGRAM/  TRANSRECTAL ULTRASOUND PROSTATE BX   03-22-2009   ESOPHAGOGASTRODUODENOSCOPY N/A 03/22/2015   Procedure: ESOPHAGOGASTRODUODENOSCOPY (EGD) with dilation;  Surgeon: [name], MD;  Location: [facility];  Service: Endoscopy;  Laterality: N/A;   excision of skin lesion     LAPAROSCOPIC CHOLECYSTECTOMY  2013   left elbow surgery      due to fracture    NASAL SEPTUM SURGERY  2000   OTHER SURGICAL HISTORY      Muscle & bone Graft/Polio   polio surgeries      14 polio surgeries    PROSTATE BIOPSY N/A 09/28/2014   Procedure: PROSTATE ULTRASOUND/BIOPSY;  Surgeon: [name], MD;  Location: [facility];  Service: Urology;  Laterality: N/A;   PROSTATE BIOPSY N/A 10/25/2015   Procedure: PROSTATE BIOPSY AND ULTRASOUND;  Surgeon: [name], MD;  Location: [facility];  Service: Urology;  Laterality: N/A;   SAVORY DILATION N/A 03/22/2015   Procedure: SAVORY DILATION;  Surgeon: [name], MD;  Location: [facility];  Service: Endoscopy;  Laterality: N/A;   SHOULDER ARTHROSCOPY WITH OPEN ROTATOR CUFF REPAIR Bilateral 2013  &  1999   removal spurs and bursectomy   TRANSURETHRAL INCISION OF BLADDER NECK N/A 10/25/2015   Procedure:  TRANSURETHRAL INCISION OF BLADDER NECK;  Surgeon: [name], MD;  [facility];  Service: Urology;  Laterality: N/A;   TRANSURETHRAL RESECTION OF PROSTATE N/A 09/28/2014   Procedure: TRANSURETHRAL RESECTION OF THE PROSTATE (TURP);  Surgeon: [name], MD;  Location: [facility];  Service: Urology;  Laterality: N/A;   URETEROSOPY STONE EXTRACTION  2000       Home Medications    Prior to Admission medications   Medication Sig Start Date End Date Taking? Authorizing Provider  albuterol (PROVENTIL) (2.5 MG/3ML) 0.083% nebulizer solution Take 3 mLs (2.5 mg total) by nebulization every 6 (six) hours as needed for wheezing or shortness of breath. 03/25/20   Valentina Shaggy, MD  albuterol (VENTOLIN HFA) 108 (90 Base) MCG/ACT inhaler Inhale 2 puffs into the lungs every 6 (six) hours as needed for  wheezing or shortness of breath. 07/01/20   Valentina Shaggy, MD  arformoterol (BROVANA) 15 MCG/2ML NEBU Take 2 mLs (15 mcg total) by nebulization 2 (two) times daily. 07/01/20   Valentina Shaggy, MD  baclofen (LIORESAL) 10 MG tablet Take 10 mg by mouth daily. 08/25/19   [provider]  budesonide (PULMICORT) 0.5 MG/2ML nebulizer solution Take 2 mLs (0.5 mg total) by nebulization 2 (two) times daily. 05/06/20   Valentina Shaggy, MD  colchicine 0.6 MG tablet TK 2 TS PO INITIALLY THEN TK 1 T IN 1 HOUR 09/20/19   [provider]  diazepam (VALIUM) 10 MG tablet Take 10 mg by mouth daily.    [provider]  diphenhydrAMINE (SOMINEX) 25 MG tablet Take by mouth.    [provider]  EPINEPHrine (EPIPEN 2-PAK) 0.3 mg/0.3 mL IJ SOAJ injection Inject 0.3 mLs (0.3 mg total) into the muscle as needed for anaphylaxis. 07/03/19   Valentina Shaggy, MD  ipratropium-albuterol (DUONEB) 0.5-2.5 (3) MG/3ML SOLN Take 3 mLs by nebulization every evening. 01/21/20   Valentina Shaggy, MD  loratadine (CLARITIN) 10 MG tablet Take 1 tablet (10 mg total) by mouth daily as needed for allergies. 01/18/20   Valentina Shaggy, MD  nitroGLYCERIN (NITROSTAT) 0.4 MG SL tablet Place 1 tablet (0.4 mg total) under the tongue every 5 (five) minutes as needed for chest pain. 06/21/15   Brunetta Jeans, PA-C  ondansetron (ZOFRAN ODT) 4 MG disintegrating tablet Take 1 tablet (4 mg total) by mouth 2 (two) times daily. 07/01/20   Tasia Catchings, Weslee Fogg V, PA-C  OXYGEN Inhale 2-3 L into the lungs at bedtime. DURING THE DAY IF NEEDED Reported on 11/25/2015    [provider]  pantoprazole (PROTONIX) 40 MG tablet Take 1 tablet (40 mg total) by mouth daily. 01/18/20   Valentina Shaggy, MD  predniSONE (DELTASONE) 10 MG tablet Take 3 tabs (28m) twice daily for five days. Then STOP. 06/06/20   GValentina Shaggy MD  predniSONE (DELTASONE) 5 MG tablet Take 1 tablet (5 mg total) by mouth 2 (two)  times daily. 06/06/20   GValentina Shaggy MD  Respiratory Therapy Supplies (NEBULIZER/TUBING/MOUTHPIECE) KIT 1 each by Does not apply route as directed. 07/24/19   GValentina Shaggy MD    Family History Family History  Problem Relation Age of Onset   Alzheimer's disease Father 4       Deceased   Stomach  cancer Father    Heart attack Father    Heart disease Father    Skin cancer Mother        Facial-Living   Alcohol abuse Sister        x2   Mental illness Sister        x2   Alcohol abuse Sister    Diabetes Maternal Aunt        x2   Thyroid disease Maternal Aunt        x4   Diabetes Maternal Uncle    Tuberculosis Paternal Grandfather    Tuberculosis Paternal Grandmother    Alzheimer's disease Paternal Aunt    Alzheimer's disease Paternal Uncle    Colon cancer Neg Hx    Colon polyps Neg Hx    Crohn's disease Neg Hx    Ulcerative colitis Neg Hx    Allergic rhinitis Neg Hx    Angioedema Neg Hx    Asthma Neg Hx    Eczema Neg Hx    Urticaria Neg Hx     Social History Social History   Tobacco Use   Smoking status: Former Smoker    Years: 47.00    Types: Pipe    Quit date: 08/25/2013    Years since quitting: 6.8   Smokeless tobacco: Former Systems developer    Types: Chew   Tobacco comment: 5-6 times smoking a pipe  Vaping Use   Vaping Use: Never used  Substance Use Topics   Alcohol use: Yes    Alcohol/week: 14.0 standard drinks    Types: 14 Cans of beer per week    Comment: 2 cans daily   Drug use: No     Allergies   Aspirin, Hornet venom, Ivp dye [iodinated diagnostic agents], Levaquin [levofloxacin in d5w], Nsaids, Penicillins, Tolmetin, Fasenra [benralizumab], Perforomist [formoterol], Buprenorphine hcl, Morphine and related, and Oxycodone   Review of Systems Review of Systems  Reason unable to perform ROS: See HPI as above.     Physical Exam Triage Vital Signs ED Triage Vitals  Enc Vitals Group     BP 07/01/20 1433 (!)  143/82     Pulse Rate 07/01/20 1433 95     Resp 07/01/20 1433 18     Temp 07/01/20 1433 100.2 F (37.9 C)     Temp Source 07/01/20 1433 Oral     SpO2 07/01/20 1433 93 %     Weight --      Height --      Head Circumference --      Peak Flow --      Pain Score 07/01/20 1434 0     Pain Loc --      Pain Edu? --      Excl. in [place]? --    No data found.  Updated Vital Signs BP (!) 143/82 (BP Location: Left Arm)    Pulse 95    Temp 100.2 F (37.9 C) (Oral)    Resp 18    SpO2 93%   Physical Exam Constitutional:      General: He is not in acute distress.    Appearance: Normal appearance. He is well-developed. He is not toxic-appearing or diaphoretic.  HENT:     Head: Normocephalic and atraumatic.  Eyes:     Conjunctiva/sclera: Conjunctivae normal.     Pupils: Pupils are equal, round, and reactive to light.  Cardiovascular:     Rate and Rhythm: Normal rate and regular rhythm.  Pulmonary:     Effort: Pulmonary effort is  normal. No respiratory distress.     Comments: LCTAB Musculoskeletal:     Cervical back: Normal range of motion and neck supple.  Skin:    General: Skin is warm and dry.  Neurological:     Mental Status: He is alert and oriented to person, place, and time.  Psychiatric:        Mood and Affect: Mood is anxious.      UC Treatments / Results  Labs (all labs ordered are listed, but only abnormal results are displayed) Labs Reviewed - No data to display  EKG   Radiology No results found.  Procedures Procedures (including critical care time)  Medications Ordered in UC Medications - No data to display  Initial Impression / Assessment and Plan / UC Course  I have reviewed the triage vital signs and the nursing notes.  Pertinent labs & imaging results that were available during my care of the patient were reviewed by me and considered in my medical decision making (see chart for details).    Patient anxious on exam, repeatedly stating his allergist  told him "you will die if you go to the hospital". States "I will die if I don't get infusion immediately". Reassurances provided. He is currently stable without hypoxia, tachypnea. LCTAB. Will refill short course of zofran. Otherwise, will have patient monitor symptoms closely. Return precautions given.  Infusion clinic contacted, LVM. Informed patient that may be 24-48 hours prior to infusion contacting.   Final Clinical Impressions(s) / UC Diagnoses   Final diagnoses:  [id number]   ED Prescriptions    Medication Sig Dispense Auth. Provider   ondansetron (ZOFRAN ODT) 4 MG disintegrating tablet Take 1 tablet (4 mg total) by mouth 2 (two) times daily. 10 tablet Ok Edwards, PA-C     PDMP not reviewed this encounter.   Ok Edwards, PA-C 07/01/20 1551    Ok Edwards, PA-C 07/01/20 1552

## 2020-07-01 NOTE — Telephone Encounter (Signed)
[name] attempted to call [name] back to let them know about his COVID result and give him the number for the antibody infusion center.  He hung up on her.   Therefore, I called him back and left a voicemail with the results and the number for the infusion center.  I repeated it a couple times so that he could call and see if he would qualify for antibody infusions.  I assume he would based on his multiple comorbidities.  I also told him that he needs to treat his healthcare providers with more respect.  Lonn Georgia is about the only nurse at our practice who deals with him anymore because he has been rude to so many other of our staff.  I did ask him to call us back if he had any questions or concerns.  [name], MD [facility] [place] [facility] [place]

## 2020-07-01 NOTE — Telephone Encounter (Signed)
Thank you for speaking with him, Beth. I hope that he does follow through with the recommendations.  [name], MD [facility] [place] [facility] [place]

## 2020-07-01 NOTE — Progress Notes (Signed)
Reached patient by phone, verified identity using two identifiers, and provided positive result.  When patient found out results he begins to state he is going to die.  This RN attempted to reassure him that COVID is not a death sentence.  Patient then states he is going to go outside and shoot himself "with my .22".  This RN states if patient is being serious she will need to inform someone because she is concerned for his safety.  Patient states "do what you gotta do".  This RN attempted to reassure patient again, and encouraged him to come see [name] at the Urgent Care to look him over and reassure him.  After hanging up this RN called 911 and informed them of her concerns, dispatcher states they will send medical over to check on patient.

## 2020-07-01 NOTE — Telephone Encounter (Signed)
It looks like he was diagnosed with COVID.  Can we call him and make sure he has the number so that he can receive antibodies? See below.     Recommendations for at home COVID-19 symptoms management:  1. Please continue isolation at home. 2. Call [phone] to see whether you might be eligible for therapeutic antibody infusions (leave your name and they will call you back).  3. If have acute worsening of symptoms please go to ER/urgent care for further evaluation. 4. Check pulse oximetry and if below 90-92% please go to ER.

## 2020-07-01 NOTE — Telephone Encounter (Addendum)
[name] received call from patient apologizing that he had to hang up on [name].  Police were at his door and searched his house for guns. 911 was called and police dispatched by Urgent Care staff after patient made comments about hurting himself after finding out he was positive for Covid.

## 2020-07-01 NOTE — Telephone Encounter (Addendum)
Called and spoke with patient.  Gave patient number to call for antibody infusion and he knows to leave his name and number and he will get a call back.  Patient states he is going to Urgent Care this afternoon for evaluation for his breathing.  Patient is very scared and afraid he is going to get worse and not survive.  Reviewed at great length measures patient can do to help with symptoms and when to seek immediate medical attention.  Enforced for patient to call the number for antibody infusion and explained the sooner he called and could see if he qualified to receive infusion, the quicker it would help him.  Told patient to call me back if he had any additional questions or issues getting through to the infusion center. Patient voiced understanding.

## 2020-07-01 NOTE — Telephone Encounter (Signed)
Patient called to explain why he hung up on you. His sister had call the police and they were at his door. And would like for you to tell him about the infusion. Please call him  [phone]

## 2020-07-02 ENCOUNTER — Other Ambulatory Visit (HOSPITAL_COMMUNITY): Payer: Self-pay | Admitting: Nurse Practitioner

## 2020-07-02 DIAGNOSIS — U071 COVID-19: Secondary | ICD-10-CM

## 2020-07-02 NOTE — Progress Notes (Signed)
I connected by phone with [name] on 07/02/2020 at 2:04 PM to discuss the potential use of an new treatment for mild to moderate COVID-19 viral infection in non-hospitalized patients.  This patient is a 69 y.o. male that meets the FDA criteria for Emergency Use Authorization of casirivimab\imdevimab.  Has a (+) direct SARS-CoV-2 viral test result  Has mild or moderate COVID-19   Is ? 70 years of age and weighs ? 40 kg  Is NOT hospitalized due to COVID-19  Is NOT requiring oxygen therapy or requiring an increase in baseline oxygen flow rate due to COVID-19  Is within 10 days of symptom onset  Has at least one of the high risk factor(s) for progression to severe COVID-19 and/or hospitalization as defined in EUA.  Specific high risk criteria : Older age (>/= 70 yo), BMI > 25, Diabetes, Cardiovascular disease or hypertension and Chronic Lung Disease   Sx onset 9/10. Unvaccinated   I have spoken and communicated the following to the patient or parent/caregiver:  1. FDA has authorized the emergency use of casirivimab\imdevimab for the treatment of mild to moderate COVID-19 in adults and pediatric patients with positive results of direct SARS-CoV-2 viral testing who are 58 years of age and older weighing at least 40 kg, and who are at high risk for progressing to severe COVID-19 and/or hospitalization.  2. The significant known and potential risks and benefits of casirivimab\imdevimab, and the extent to which such potential risks and benefits are unknown.  3. Information on available alternative treatments and the risks and benefits of those alternatives, including clinical trials.  4. Patients treated with casirivimab\imdevimab should continue to self-isolate and use infection control measures (e.g., wear mask, isolate, social distance, avoid sharing personal items, clean and disinfect "high touch" surfaces, and frequent handwashing) according to CDC guidelines.   5. The patient or  parent/caregiver has the option to accept or refuse casirivimab\imdevimab .  After reviewing this information with the patient, The patient agreed to proceed with receiving casirivimab\imdevimab infusion and will be provided a copy of the Fact sheet prior to receiving the infusion.Beckey Rutter, [place], AGNP-C [phone] ([place])

## 2020-07-03 ENCOUNTER — Telehealth: Payer: Self-pay | Admitting: Allergy & Immunology

## 2020-07-03 ENCOUNTER — Ambulatory Visit (HOSPITAL_COMMUNITY)
Admission: RE | Admit: 2020-07-03 | Discharge: 2020-07-03 | Disposition: A | Discharge status: Home / Self Care | Payer: Medicare Other | Source: Ambulatory Visit | Attending: Pulmonary Disease | Admitting: Pulmonary Disease

## 2020-07-03 DIAGNOSIS — U071 COVID-19: Secondary | ICD-10-CM

## 2020-07-03 DIAGNOSIS — Z23 Encounter for immunization: Secondary | ICD-10-CM | POA: Diagnosis not present

## 2020-07-03 MED ORDER — DIPHENHYDRAMINE HCL 50 MG/ML IJ SOLN: 50.0000 mg | Freq: Once | INTRAMUSCULAR | Status: DC | PRN

## 2020-07-03 MED ORDER — SODIUM CHLORIDE 0.9 % IV SOLN
1200.0000 mg | Freq: Once | INTRAVENOUS | Status: AC
  Administered 2020-07-03: 1200 mg via INTRAVENOUS

## 2020-07-03 MED ORDER — ALBUTEROL SULFATE HFA 108 (90 BASE) MCG/ACT IN AERS: 2.0000 | INHALATION_SPRAY | Freq: Once | RESPIRATORY_TRACT | Status: DC | PRN

## 2020-07-03 MED ORDER — EPINEPHRINE 0.3 MG/0.3ML IJ SOAJ: 0.3000 mg | Freq: Once | INTRAMUSCULAR | Status: DC | PRN

## 2020-07-03 MED ORDER — FAMOTIDINE IN NACL 20-0.9 MG/50ML-% IV SOLN: 20.0000 mg | Freq: Once | INTRAVENOUS | Status: DC | PRN

## 2020-07-03 MED ORDER — SODIUM CHLORIDE 0.9 % IV SOLN: INTRAVENOUS | Status: DC | PRN

## 2020-07-03 MED ORDER — METHYLPREDNISOLONE SODIUM SUCC 125 MG IJ SOLR: 125.0000 mg | Freq: Once | INTRAMUSCULAR | Status: DC | PRN

## 2020-07-03 NOTE — Progress Notes (Signed)
°  Diagnosis: COVID-19 ° °Physician: Wright, MD ° °Procedure: [facility] Infusion Clinic Med: casirivimab\imdevimab infusion - Provided patient with casirivimab\imdevimab fact sheet for patients, parents and caregivers prior to infusion. ° °Complications: No immediate complications noted. ° °Discharge: Discharged home  ° °Niylah Hassan R Candie Gintz °07/03/2020 ° ° °

## 2020-07-03 NOTE — Discharge Instructions (Signed)

## 2020-07-03 NOTE — Telephone Encounter (Signed)
Patient called me to give me an update over the weekend and ask about oxygen use. He did finally agree to go and get the antibody infusion, which he did yesterday. He apparently tolerated it well and was told to take around the clock Benadryl since he has a history of reactions to biologics. He has been doing this. He feels that his sense of taste and smell have returned. He continues to have a hacking cough and shortness of breath.   Typically at night he is on 2 L O2 via [place]. He has bumped it up to 2.5 L and is wondering how high he should go. He does have a pulse ox handy, but he cannot find it right now. I received his notes and his typical range if 96-98%. I recommended that he find the pulse ox and titrate the oxygen until it reaches 94% or higher.   I also reccommended that he go up to prednisone 20mg  BID for now.   We will call tomorrow to check up on him.   [name], MD [facility] [place] [facility] [place]

## 2020-07-04 NOTE — Telephone Encounter (Signed)
Attempted to call and speak with [name] there was no answer. Left a voicemail. Called and spoke with the patient's wife and she stated that he is feeling better and starting to be ornery which is a sign that he is feeling better. She stated that he is still using his oxygen and nebulizer medication and 10mg  prednisone. She states that his condition is stable at this time.

## 2020-07-05 NOTE — Telephone Encounter (Signed)
Dr. Ernst Bowler I know he had the infusion but his symptoms seem to be needing a little something extra.

## 2020-07-05 NOTE — Telephone Encounter (Signed)
Patient called back returning a phone call. Patient had some questions regarding what medications he could take. He states he is full of phlegm and wants to know if he can take Mucinex. He is sweating all the time and wants to know why. Patient says he is feeling somewhat better and that he is fighting this.   Please advise.

## 2020-07-06 ENCOUNTER — Telehealth: Payer: Self-pay

## 2020-07-06 ENCOUNTER — Other Ambulatory Visit: Payer: Self-pay

## 2020-07-06 ENCOUNTER — Emergency Department (HOSPITAL_COMMUNITY)
Admission: EM | Admit: 2020-07-06 | Discharge: 2020-07-07 | Disposition: A | Discharge status: Home / Self Care | Payer: Medicare Other | Attending: Emergency Medicine | Admitting: Emergency Medicine

## 2020-07-06 ENCOUNTER — Ambulatory Visit: Payer: Self-pay | Admitting: Internal Medicine

## 2020-07-06 DIAGNOSIS — F419 Anxiety disorder, unspecified: Secondary | ICD-10-CM | POA: Diagnosis not present

## 2020-07-06 DIAGNOSIS — Z79899 Other long term (current) drug therapy: Secondary | ICD-10-CM | POA: Diagnosis not present

## 2020-07-06 DIAGNOSIS — J449 Chronic obstructive pulmonary disease, unspecified: Secondary | ICD-10-CM | POA: Diagnosis not present

## 2020-07-06 DIAGNOSIS — J1282 Pneumonia due to coronavirus disease 2019: Secondary | ICD-10-CM | POA: Insufficient documentation

## 2020-07-06 DIAGNOSIS — E119 Type 2 diabetes mellitus without complications: Secondary | ICD-10-CM | POA: Diagnosis not present

## 2020-07-06 DIAGNOSIS — R0602 Shortness of breath: Secondary | ICD-10-CM | POA: Diagnosis present

## 2020-07-06 DIAGNOSIS — Z87891 Personal history of nicotine dependence: Secondary | ICD-10-CM | POA: Diagnosis not present

## 2020-07-06 DIAGNOSIS — F418 Other specified anxiety disorders: Secondary | ICD-10-CM

## 2020-07-06 DIAGNOSIS — U071 COVID-19: Secondary | ICD-10-CM

## 2020-07-06 DIAGNOSIS — I10 Essential (primary) hypertension: Secondary | ICD-10-CM | POA: Diagnosis not present

## 2020-07-06 MED ORDER — ONDANSETRON 4 MG PO TBDP: 4.0000 mg | ORAL_TABLET | Freq: Three times a day (TID) | ORAL | 1 refills | Status: DC | PRN

## 2020-07-06 NOTE — Telephone Encounter (Signed)
He can certainly use Mucinex (600 to 1200 mg twice daily).  This COVID is not going to disappear with 1 infusion of his antibodies.  He is already increased his prednisone.  Please confirm the dose that he is taking.  [name], MD [facility] [place] [facility] [place]

## 2020-07-06 NOTE — Addendum Note (Signed)
Addended by: Lucrezia Starch I on: 07/06/2020 04:52 PM   Modules accepted: Orders

## 2020-07-06 NOTE — Telephone Encounter (Signed)
Please advise 

## 2020-07-06 NOTE — Telephone Encounter (Signed)
Patient informed of this information. I also let him know about the use of his oxygen at night and how he could titrate the lpm depending on his oxygen level so that he could maintain the 92-95 that Dr. Ernst Bowler wants. I told him that if it drops that low and they are unable to get it back up to go to the hospital or call 911. I am also refilling his Zofran and sending it to [facility] on [address]. Patient is agreeable with this. He will call back with further questions if needed. I am sending a message to [facility] about referring him to [facility] about the CPAP machine.

## 2020-07-06 NOTE — ED Triage Notes (Signed)
Patient brought in by EMS for complaint of shortness of breath and nausea. Patient was diagnosed with COVID on 06/24/20. Patient went to [facility] a week ago, left AMA due to wait time. Patient is AxOx4, speaking in full sentences without difficulty, handling own secretions.

## 2020-07-06 NOTE — Telephone Encounter (Signed)
Hi Dee. Can you please see about getting patient in to [facility] about the CPAP machine.

## 2020-07-06 NOTE — Telephone Encounter (Signed)
Spoke to patient and informed him that he could take the mucinex twice daily. He was unsure if the dose for the prednisone and insisted that he needed IV fluids due to him not being able to consume water any longer. I informed him that we don't do IV's in office and advided him to seek medical attention at his local ER to be evaluated. He agreed and communicated understanding.

## 2020-07-06 NOTE — Telephone Encounter (Signed)
I think he should go to the ED if he is still feeling ill feels that he needs more intensive care.  [name], MD Allergy and [place] [facility] [place]

## 2020-07-06 NOTE — Telephone Encounter (Signed)
Yeah we do not deal with CPAPs. I think he has fired all of the pulmonologists in his life, so we might have to send to [facility] instead. I do not know anyone's protocols on IVF, so I do not have much to add there.   We could send in Zofran if he is having intense nausea.   [name], MD Allergy and [place] [facility] [place]

## 2020-07-06 NOTE — Telephone Encounter (Signed)
Pt called stating had infusion but is getting worse tonight and can not breathe as well. Reason for Disposition . MODERATE difficulty breathing (e.g., speaks in phrases, SOB even at rest, pulse 100-120)  Answer Assessment - Initial Assessment Questions 1. COVID-19 DIAGNOSIS: "Who made your Coronavirus (COVID-19) diagnosis?" "Was it confirmed by a positive lab test?" If not diagnosed by a HCP, ask "Are there lots of cases (community spread) where you live?" (See public health department website, if unsure)     [place] 2. COVID-19 EXPOSURE: "Was there any known exposure to [place] before the symptoms began?" CDC Definition of close contact: within 6 feet (2 meters) for a total of 15 minutes or more over a 24-hour period.      unknown 3. ONSET: "When did the COVID-19 symptoms start?"       4. WORST SYMPTOM: "What is your worst symptom?" (e.g., cough, fever, shortness of breath, muscle aches)     SOB 5. COUGH: "Do you have a cough?" If Yes, ask: "How bad is the cough?"       na 6. FEVER: "Do you have a fever?" If Yes, ask: "What is your temperature, how was it measured, and when did it start?"     na 7. RESPIRATORY STATUS: "Describe your breathing?" (e.g., shortness of breath, wheezing, unable to speak)      Requiring more oxygen, up to 3 liters and ox sat 93, feels sob. 8. BETTER-SAME-WORSE: "Are you getting better, staying the same or getting worse compared to yesterday?"  If getting worse, ask, "In what way?"     worse 9. HIGH RISK DISEASE: "Do you have any chronic medical problems?" (e.g., asthma, heart or lung disease, weak immune system, obesity, etc.)     Yes, has colon cancer 10. PREGNANCY: "Is there any chance you are pregnant?" "When was your last menstrual period?"       na 11. OTHER SYMPTOMS: "Do you have any other symptoms?"  (e.g., chills, fatigue, headache, loss of smell or taste, muscle pain, sore throat; new loss of smell or taste especially support the diagnosis of  COVID-19)       na  Protocols used: CORONAVIRUS (COVID-19) DIAGNOSED OR SUSPECTED-A-AH Pt states he has colon cancer and had to put phone down during call, caregiver ? Wife picked up call. She stated she would call 911 but she had to assist him to the bathroom. She first thought she could drive him to ED but he did not have portable oxygen, only in home set up. Wife stated she wants to call 911 after getting pt settled in bathroom. Care turned over to her to call.

## 2020-07-06 NOTE — Telephone Encounter (Signed)
Patient informed of recommendation to go to ED. Patient says that he had gone a couple of days without a bowel movement but did have one today. He says that post bowel movement he did feel quite a bit better than he has. He is drinking plenty of water and did get some pedialyte and gatorade as recommended. He is taking all medications as instructed. He is not really able to eat much but is managing to eat some. Nothing really today. Low grade fever today but he is taking tylenol. [facility] is supposed to be bringing him more portable oxygen. He is complaining of his oxygen level dropping to the 80's while sleeping. He is currently using 2-3 lpm of oxygen throughout the day and night. He no longer has his cpap because he was not using it. He would like a new one if possible, I did let him know we may need to have him follow up with his primary care to see a pulmonologist as we generally do not handle those. [facility] has contacted him about a follow up appointment on 07-13-20. He has also called them back about the intravenous fluids he says that he needs because he is dehydrated. I did go over quarantine protocol with him and advised on in home protocols given that his wife has now tested positive. Please advise on further instructions and thank you.

## 2020-07-07 ENCOUNTER — Emergency Department (HOSPITAL_COMMUNITY): Payer: Medicare Other

## 2020-07-07 LAB — CBC WITH DIFFERENTIAL/PLATELET
Abs Immature Granulocytes: 0.22 10*3/uL — ABNORMAL HIGH (ref 0.00–0.07)
Basophils Absolute: 0 10*3/uL (ref 0.0–0.1)
Basophils Relative: 0 %
Eosinophils Absolute: 0 10*3/uL (ref 0.0–0.5)
Eosinophils Relative: 0 %
HCT: 46.1 % (ref 39.0–52.0)
Hemoglobin: 15.8 g/dL (ref 13.0–17.0)
Immature Granulocytes: 2 %
Lymphocytes Relative: 4 %
Lymphs Abs: 0.4 10*3/uL — ABNORMAL LOW (ref 0.7–4.0)
MCH: 32.3 pg (ref 26.0–34.0)
MCHC: 34.3 g/dL (ref 30.0–36.0)
MCV: 94.3 fL (ref 80.0–100.0)
Monocytes Absolute: 0.6 10*3/uL (ref 0.1–1.0)
Monocytes Relative: 5 %
Neutro Abs: 9.2 10*3/uL — ABNORMAL HIGH (ref 1.7–7.7)
Neutrophils Relative %: 89 %
Platelets: 285 10*3/uL (ref 150–400)
RBC: 4.89 MIL/uL (ref 4.22–5.81)
RDW: 12.2 % (ref 11.5–15.5)
WBC: 10.5 10*3/uL (ref 4.0–10.5)
nRBC: 0 % (ref 0.0–0.2)

## 2020-07-07 LAB — TRIGLYCERIDES: Triglycerides: 106 mg/dL (ref <150)

## 2020-07-07 LAB — COMPREHENSIVE METABOLIC PANEL
ALT: 46 U/L — ABNORMAL HIGH (ref 0–44)
AST: 42 U/L — ABNORMAL HIGH (ref 15–41)
Albumin: 2.9 g/dL — ABNORMAL LOW (ref 3.5–5.0)
Alkaline Phosphatase: 47 U/L (ref 38–126)
Anion gap: 12 (ref 5–15)
BUN: 12 mg/dL (ref 8–23)
CO2: 28 mmol/L (ref 22–32)
Calcium: 9 mg/dL (ref 8.9–10.3)
Chloride: 96 mmol/L — ABNORMAL LOW (ref 98–111)
Creatinine, Ser: 1.11 mg/dL (ref 0.61–1.24)
GFR calc Af Amer: >60 mL/min (ref >60)
GFR calc non Af Amer: >60 mL/min (ref >60)
Glucose, Bld: 150 mg/dL — ABNORMAL HIGH (ref 70–99)
Potassium: 4.3 mmol/L (ref 3.5–5.1)
Sodium: 136 mmol/L (ref 135–145)
Total Bilirubin: 1.2 mg/dL (ref 0.3–1.2)
Total Protein: 7.2 g/dL (ref 6.5–8.1)

## 2020-07-07 LAB — D-DIMER, QUANTITATIVE: D-Dimer, Quant: 1.54 ug/mL-FEU — ABNORMAL HIGH (ref 0.00–0.50)

## 2020-07-07 LAB — URINALYSIS, ROUTINE W REFLEX MICROSCOPIC
Bacteria, UA: NONE SEEN
Bilirubin Urine: NEGATIVE
Glucose, UA: NEGATIVE mg/dL
Hgb urine dipstick: NEGATIVE
Ketones, ur: 20 mg/dL — AB
Leukocytes,Ua: NEGATIVE
Nitrite: NEGATIVE
Protein, ur: 30 mg/dL — AB
Specific Gravity, Urine: 1.02 (ref 1.005–1.030)
pH: 6 (ref 5.0–8.0)

## 2020-07-07 LAB — FIBRINOGEN: Fibrinogen: 756 mg/dL — ABNORMAL HIGH (ref 210–475)

## 2020-07-07 LAB — ETHANOL: Alcohol, Ethyl (B): <10 mg/dL (ref <10)

## 2020-07-07 LAB — LACTATE DEHYDROGENASE: LDH: 297 U/L — ABNORMAL HIGH (ref 98–192)

## 2020-07-07 LAB — LACTIC ACID, PLASMA: Lactic Acid, Venous: 1.6 mmol/L (ref 0.5–1.9)

## 2020-07-07 LAB — FERRITIN: Ferritin: 1013 ng/mL — ABNORMAL HIGH (ref 24–336)

## 2020-07-07 LAB — PROCALCITONIN: Procalcitonin: <0.1 ng/mL

## 2020-07-07 LAB — C-REACTIVE PROTEIN: CRP: 12.7 mg/dL — ABNORMAL HIGH (ref <1.0)

## 2020-07-07 MED ORDER — ONDANSETRON 8 MG PO TBDP: 8.0000 mg | ORAL_TABLET | Freq: Three times a day (TID) | ORAL | 0 refills | Status: DC | PRN

## 2020-07-07 MED ORDER — ONDANSETRON HCL 4 MG/2ML IJ SOLN
4.0000 mg | Freq: Once | INTRAMUSCULAR | Status: AC
  Administered 2020-07-07: 4 mg via INTRAVENOUS
  Filled 2020-07-07: qty 2

## 2020-07-07 NOTE — Telephone Encounter (Signed)
[name] with [employer] [phone]) is reaching out about the oxygen delivery issue. He is going to reach out to Dr. Ernst Bowler as well with an update. Patient did go to [facility] 07-06-20 and the notes are available.

## 2020-07-07 NOTE — Telephone Encounter (Signed)
Referral has been placed to [facility] for review and scheduling.   Thanks

## 2020-07-07 NOTE — Telephone Encounter (Signed)
Patient last had Home Sleep Study 11/09/2015.

## 2020-07-07 NOTE — ED Provider Notes (Signed)
[facility] DEPT Provider Note: Georgena Spurling, MD, FACEP  CSN: [id number] MRN: [id number] ARRIVAL: 07/06/20 at 2342 ROOM: WA02/WA02   CHIEF COMPLAINT  Shortness of Breath   HISTORY OF PRESENT ILLNESS  07/07/20 12:07 AM [name] is a 69 y.o. male with multiple medical problems including history of alcohol abuse and post polio syndrome.  He was diagnosed with Covid earlier this month and received the monoclonal antibody infusion on 07/03/2020.  He is here with worsening shortness of breath over the past 5 days.  He states his oxygen saturation drops into the low 80s when he is off oxygen although his oxygen saturation here is noted to be 95% on room air.   He states he has not had anything to eat in 5 days.  He has been able to drink water but he he states his mouth is dry and he is producing "no spit".  He has had nausea but no vomiting but believes he would vomit if he tried to eat. He states he is hurting in his kidneys and his liver.  He states he has been told that his right kidney (which he states is atrophic due to polio) is in danger of shutting down if he does not get IV fluids.   EMS reports the patient was asking for his last rights as he fears he is preparing to die.  Nursing staff reports similar statements.  He states he has not urinated in days and has not had a bowel movement in 6 months.   Past Medical History:  Diagnosis Date  . Anxiety disorder   . Arthritis    BILATERAL SHOULDERS, ELBOWS AND HANDS AND LEFT HIP AND KNEES--HX CORTISONE SHOTS IN SHOULDERS, ELBOWS, HIP AND KNEES  . Basal cell carcinoma of back 04/30/2016   Dermatologist- Dr Nevada Crane;   MOHS sx- Dr Levada Dy   . Bladder outlet obstruction   . Chronic idiopathic constipation 05/08/2015  . Complication of anesthesia    DIFFICULT WAKING   . COPD (chronic obstructive pulmonary disease) Gold C Frequent exacerbations 01/13/2014   [name] 07/08/14: FeV1 51% FeV1/FVC 66% FVC 59% 10/5/2015ONO RA was normal   10/13/2014  ONO on RA NORMAL   . COPD, frequent exacerbations ([place])    pulmologist-  dr Joya Gaskins--  [place] Rm Stage C.04-25-15 recent COPD exacerbation-much improved now, after tx. in ER [facility].  Marland Kitchen Depression   . Diabetes mellitus without complication ([place])    BODERLINE - DIET CONTROL  . Dysrhythmia    PVC'S  . Emphysema lung ([place])    stage 2  . Family history of adverse reaction to anesthesia    father would wake up with agitation   . Former smoker 01/13/2014  . Gastroesophageal reflux disease without esophagitis   . Heavy alcohol use 04/30/2016  . History of chronic bronchitis   . History of oxygen administration    oxygen use 2 l/m nasally at bedtime and exertional occasions  . History of rheumatic fever   . History of TB (tuberculosis)    1984--  hospitalized for 4 month treatment  . History of urinary retention   . Hx of multiple concussions    x 2 per patient   . Hypertension   . Melanoma ([place])   . Nocturnal oxygen desaturation    USES O2 NIGHTLY  . OSA (obstructive sleep apnea) 01/13/2014  . PONV (postoperative nausea and vomiting)   . Post-polio syndrome    polio at age 69--PT WAS IN IRON LUNG; PT WAS  IN W/C UNTIL AGE 87; STILL HAS WEAKNESS RIGHT SIDE  . Prostate cancer ([place])   . Schizophrenia ([place])   . Tuberculosis    Hosp 4 months rx , left early   . Urticaria     Past Surgical History:  Procedure Laterality Date  . CARDIOVASCULAR STRESS TEST  06-08-2014  dr Mare Ferrari   normal lexiscan study/  no ischemia/  not gated due to PAC's  . COLONOSCOPY N/A 05/03/2015   Procedure: COLONOSCOPY;  Surgeon: [name], MD;  Location: [facility];  Service: Endoscopy;  Laterality: N/A;  . CYSTOSCOPY N/A 10/25/2015   Procedure: CYSTOSCOPY;  Surgeon: [name], MD;  Location: [facility];  Service: Urology;  Laterality: N/A;  . CYSTOSCOPY W/ CYSTOGRAM/  TRANSRECTAL ULTRASOUND PROSTATE BX  03-22-2009  . ESOPHAGOGASTRODUODENOSCOPY N/A 03/22/2015   Procedure: ESOPHAGOGASTRODUODENOSCOPY  (EGD) with dilation;  Surgeon: [name], MD;  Location: [facility];  Service: Endoscopy;  Laterality: N/A;  . excision of skin lesion    . LAPAROSCOPIC CHOLECYSTECTOMY  2013  . left elbow surgery      due to fracture   . NASAL SEPTUM SURGERY  2000  . OTHER SURGICAL HISTORY      Muscle & bone Graft/Polio  . polio surgeries      14 polio surgeries   . PROSTATE BIOPSY N/A 09/28/2014   Procedure: PROSTATE ULTRASOUND/BIOPSY;  Surgeon: [name], MD;  Location: [facility];  Service: Urology;  Laterality: N/A;  . PROSTATE BIOPSY N/A 10/25/2015   Procedure: PROSTATE BIOPSY AND ULTRASOUND;  Surgeon: [name], MD;  Location: [facility];  Service: Urology;  Laterality: N/A;  . SAVORY DILATION N/A 03/22/2015   Procedure: SAVORY DILATION;  Surgeon: [name], MD;  Location: [facility];  Service: Endoscopy;  Laterality: N/A;  . SHOULDER ARTHROSCOPY WITH OPEN ROTATOR CUFF REPAIR Bilateral 2013  &  1999   removal spurs and bursectomy  . TRANSURETHRAL INCISION OF BLADDER NECK N/A 10/25/2015   Procedure:  TRANSURETHRAL INCISION OF BLADDER NECK;  Surgeon: [name], MD;  Location: [facility];  Service: Urology;  Laterality: N/A;  . TRANSURETHRAL RESECTION OF PROSTATE N/A 09/28/2014   Procedure: TRANSURETHRAL RESECTION OF THE PROSTATE (TURP);  Surgeon: [name], MD;  Location: [facility];  Service: Urology;  Laterality: N/A;  . URETEROSOPY STONE EXTRACTION  2000    Family History  Problem Relation Age of Onset  . Alzheimer's disease Father 4       Deceased  . Stomach cancer Father   . Heart attack Father   . Heart disease Father   . Skin cancer Mother        Facial-Living  . Alcohol abuse Sister        x2  . Mental illness Sister        x2  . Alcohol abuse Sister   . Diabetes Maternal Aunt        x2  . Thyroid disease Maternal Aunt        x4  . Diabetes Maternal Uncle   . Tuberculosis Paternal Grandfather   . Tuberculosis Paternal Grandmother   . Alzheimer's disease Paternal Aunt   .  Alzheimer's disease Paternal Uncle   . Colon cancer Neg Hx   . Colon polyps Neg Hx   . Crohn's disease Neg Hx   . Ulcerative colitis Neg Hx   . Allergic rhinitis Neg Hx   . Angioedema Neg Hx   . Asthma Neg Hx   . Eczema Neg Hx   .  Urticaria Neg Hx     Social History   Tobacco Use  . Smoking status: Former Smoker    Years: 47.00    Types: Pipe    Quit date: 08/25/2013    Years since quitting: 6.8  . Smokeless tobacco: Former Systems developer    Types: Chew  . Tobacco comment: 5-6 times smoking a pipe  Vaping Use  . Vaping Use: Never used  Substance Use Topics  . Alcohol use: Yes    Alcohol/week: 14.0 standard drinks    Types: 14 Cans of beer per week    Comment: 2 cans daily  . Drug use: No    Prior to Admission medications   Medication Sig Start Date End Date Taking? Authorizing Provider  albuterol (PROVENTIL) (2.5 MG/3ML) 0.083% nebulizer solution Take 3 mLs (2.5 mg total) by nebulization every 6 (six) hours as needed for wheezing or shortness of breath. 03/25/20   Valentina Shaggy, MD  albuterol (VENTOLIN HFA) 108 (90 Base) MCG/ACT inhaler Inhale 2 puffs into the lungs every 6 (six) hours as needed for wheezing or shortness of breath. 07/01/20   Valentina Shaggy, MD  arformoterol (BROVANA) 15 MCG/2ML NEBU Take 2 mLs (15 mcg total) by nebulization 2 (two) times daily. 07/01/20   Valentina Shaggy, MD  baclofen (LIORESAL) 10 MG tablet Take 10 mg by mouth daily. 08/25/19   [provider]  budesonide (PULMICORT) 0.5 MG/2ML nebulizer solution Take 2 mLs (0.5 mg total) by nebulization 2 (two) times daily. 05/06/20   Valentina Shaggy, MD  colchicine 0.6 MG tablet TK 2 TS PO INITIALLY THEN TK 1 T IN 1 HOUR 09/20/19   [provider]  diazepam (VALIUM) 10 MG tablet Take 10 mg by mouth daily.    [provider]  diphenhydrAMINE (SOMINEX) 25 MG tablet Take by mouth.    [provider]  EPINEPHrine (EPIPEN 2-PAK) 0.3 mg/0.3 mL IJ SOAJ injection  Inject 0.3 mLs (0.3 mg total) into the muscle as needed for anaphylaxis. 07/03/19   Valentina Shaggy, MD  ipratropium-albuterol (DUONEB) 0.5-2.5 (3) MG/3ML SOLN Take 3 mLs by nebulization every evening. 01/21/20   Valentina Shaggy, MD  loratadine (CLARITIN) 10 MG tablet Take 1 tablet (10 mg total) by mouth daily as needed for allergies. 01/18/20   Valentina Shaggy, MD  nitroGLYCERIN (NITROSTAT) 0.4 MG SL tablet Place 1 tablet (0.4 mg total) under the tongue every 5 (five) minutes as needed for chest pain. 06/21/15   Brunetta Jeans, PA-C  ondansetron (ZOFRAN ODT) 4 MG disintegrating tablet Take 1 tablet (4 mg total) by mouth every 8 (eight) hours as needed for nausea or vomiting. 07/06/20   Valentina Shaggy, MD  OXYGEN Inhale 2-3 L into the lungs at bedtime. DURING THE DAY IF NEEDED Reported on 11/25/2015    [provider]  pantoprazole (PROTONIX) 40 MG tablet Take 1 tablet (40 mg total) by mouth daily. 01/18/20   Valentina Shaggy, MD  predniSONE (DELTASONE) 10 MG tablet Take 3 tabs (43m) twice daily for five days. Then STOP. 06/06/20   GValentina Shaggy MD  predniSONE (DELTASONE) 5 MG tablet Take 1 tablet (5 mg total) by mouth 2 (two) times daily. 06/06/20   GValentina Shaggy MD  Respiratory Therapy Supplies (NEBULIZER/TUBING/MOUTHPIECE) KIT 1 each by Does not apply route as directed. 07/24/19   GValentina Shaggy MD    Allergies Aspirin, Hornet venom, Ivp dye [iodinated diagnostic agents], Levaquin [levofloxacin in d5w], Nsaids, Penicillins, Tolmetin,  Fasenra [benralizumab], Perforomist [formoterol], Buprenorphine hcl, Morphine and related, and Oxycodone   REVIEW OF SYSTEMS  Negative except as noted here or in the History of Present Illness.   PHYSICAL EXAMINATION  Initial Vital Signs Blood pressure 124/77, pulse 93, temperature 99.4 F (37.4 C), temperature source Oral, resp. rate 20, height 6' (1.829 m), weight 98.9 kg, SpO2 95  %.  Examination General: Well-developed, well-nourished male in no acute distress; appearance consistent with age of record HENT: normocephalic; atraumatic Eyes: Normal appearance Neck: supple Heart: regular rate and rhythm; tachycardia Lungs: clear to auscultation bilaterally; mild tachypnea Abdomen: soft; nondistended; right upper quadrant tenderness without appreciable hepatomegaly; bowel sounds present Extremities: No deformity; full range of motion; pulses normal Neurologic: Awake, alert and oriented; motor function intact in all extremities and symmetric; no facial droop Skin: Warm and dry Psychiatric: Anxious   RESULTS  Summary of this visit's results, reviewed and interpreted by myself:   EKG Interpretation  Date/Time:  Thursday July 07 2020 00:04:51 EDT Ventricular Rate:  89 PR Interval:    QRS Duration: 102 QT Interval:  364 QTC Calculation: 443 R Axis:   85 Text Interpretation: Sinus rhythm Probable left atrial enlargement Consider right ventricular hypertrophy No significant change was found Confirmed by Shanon Rosser [phone]) on 07/07/2020 12:14:48 AM      Laboratory Studies: Results for orders placed or performed during the hospital encounter of 07/06/20 (from the past 24 hour(s))  CBC with Differential/Platelet     Status: Abnormal   Collection Time: 07/07/20 12:40 AM  Result Value Ref Range   WBC 10.5 4.0 - 10.5 K/uL   RBC 4.89 4.22 - 5.81 MIL/uL   Hemoglobin 15.8 13.0 - 17.0 g/dL   HCT 46.1 39 - 52 %   MCV 94.3 80.0 - 100.0 fL   MCH 32.3 26.0 - 34.0 pg   MCHC 34.3 30.0 - 36.0 g/dL   RDW 12.2 11.5 - 15.5 %   Platelets 285 150 - 400 K/uL   nRBC 0.0 0.0 - 0.2 %   Neutrophils Relative % 89 %   Neutro Abs 9.2 (H) 1.7 - 7.7 K/uL   Lymphocytes Relative 4 %   Lymphs Abs 0.4 (L) 0.7 - 4.0 K/uL   Monocytes Relative 5 %   Monocytes Absolute 0.6 0 - 1 K/uL   Eosinophils Relative 0 %   Eosinophils Absolute 0.0 0 - 0 K/uL   Basophils Relative 0 %    Basophils Absolute 0.0 0 - 0 K/uL   Immature Granulocytes 2 %   Abs Immature Granulocytes 0.22 (H) 0.00 - 0.07 K/uL  Comprehensive metabolic panel     Status: Abnormal   Collection Time: 07/07/20 12:40 AM  Result Value Ref Range   Sodium 136 135 - 145 mmol/L   Potassium 4.3 3.5 - 5.1 mmol/L   Chloride 96 (L) 98 - 111 mmol/L   CO2 28 22 - 32 mmol/L   Glucose, Bld 150 (H) 70 - 99 mg/dL   BUN 12 8 - 23 mg/dL   Creatinine, Ser 1.11 0.61 - 1.24 mg/dL   Calcium 9.0 8.9 - 10.3 mg/dL   Total Protein 7.2 6.5 - 8.1 g/dL   Albumin 2.9 (L) 3.5 - 5.0 g/dL   AST 42 (H) 15 - 41 U/L   ALT 46 (H) 0 - 44 U/L   Alkaline Phosphatase 47 38 - 126 U/L   Total Bilirubin 1.2 0.3 - 1.2 mg/dL   GFR calc non Af Amer >60 >60 mL/min  GFR calc Af Amer >60 >60 mL/min   Anion gap 12 5 - 15  Lactic acid, plasma     Status: None   Collection Time: 07/07/20 12:40 AM  Result Value Ref Range   Lactic Acid, Venous 1.6 0.5 - 1.9 mmol/L  D-dimer, quantitative     Status: Abnormal   Collection Time: 07/07/20 12:40 AM  Result Value Ref Range   D-Dimer, Quant 1.54 (H) 0.00 - 0.50 ug/mL-FEU  Procalcitonin     Status: None   Collection Time: 07/07/20 12:40 AM  Result Value Ref Range   Procalcitonin <0.10 ng/mL  Lactate dehydrogenase     Status: Abnormal   Collection Time: 07/07/20 12:40 AM  Result Value Ref Range   LDH 297 (H) 98 - 192 U/L  Ferritin     Status: Abnormal   Collection Time: 07/07/20 12:40 AM  Result Value Ref Range   Ferritin 1,013 (H) 24 - 336 ng/mL  Triglycerides     Status: None   Collection Time: 07/07/20 12:40 AM  Result Value Ref Range   Triglycerides 106 <150 mg/dL  Fibrinogen     Status: Abnormal   Collection Time: 07/07/20 12:40 AM  Result Value Ref Range   Fibrinogen 756 (H) 210 - 475 mg/dL  C-reactive protein     Status: Abnormal   Collection Time: 07/07/20 12:40 AM  Result Value Ref Range   CRP 12.7 (H) <1.0 mg/dL  Ethanol     Status: None   Collection Time: 07/07/20 12:40 AM   Result Value Ref Range   Alcohol, Ethyl (B) <10 <10 mg/dL  Urinalysis, Routine w reflex microscopic     Status: Abnormal   Collection Time: 07/07/20  4:26 AM  Result Value Ref Range   Color, Urine YELLOW YELLOW   APPearance CLEAR CLEAR   Specific Gravity, Urine 1.020 1.005 - 1.030   pH 6.0 5.0 - 8.0   Glucose, UA NEGATIVE NEGATIVE mg/dL   Hgb urine dipstick NEGATIVE NEGATIVE   Bilirubin Urine NEGATIVE NEGATIVE   Ketones, ur 20 (A) NEGATIVE mg/dL   Protein, ur 30 (A) NEGATIVE mg/dL   Nitrite NEGATIVE NEGATIVE   Leukocytes,Ua NEGATIVE NEGATIVE   RBC / HPF 0-5 0 - 5 RBC/hpf   WBC, UA 0-5 0 - 5 WBC/hpf   Bacteria, UA NONE SEEN NONE SEEN   Mucus PRESENT    Imaging Studies: DG Chest Port 1 View  Result Date: 07/07/2020 CLINICAL DATA:  COVID pneumonia, dyspnea EXAM: PORTABLE CHEST 1 VIEW COMPARISON:  07/23/2019 FINDINGS: Lung volumes are small. There are superimposed asymmetric left mid lung zone and bibasilar pulmonary infiltrates, likely infectious or inflammatory in nature. No pneumothorax or pleural effusion. Cardiac size within normal limits. Pulmonary vascularity is normal. IMPRESSION: Low lung volumes. Superimposed asymmetric bilateral pulmonary infiltrates, likely infectious or inflammatory. Electronically Signed   By: Fidela Salisbury MD   On: 07/07/2020 00:38    ED COURSE and MDM  Nursing notes, initial and subsequent vitals signs, including pulse oximetry, reviewed and interpreted by myself.  Vitals:   07/07/20 0030 07/07/20 0100 07/07/20 0130 07/07/20 0200  BP:  131/68 137/63 (!) 152/67  Pulse: 83 86 97 91  Resp: 20 (!) 22 (!) 24 (!) 24  Temp:      TempSrc:      SpO2: 97% 94% 97% 96%  Weight:      Height:       Medications  ondansetron (ZOFRAN) injection 4 mg (4 mg Intravenous Given 07/07/20 0052)   2:15  AM Patient continues to be in no distress with an oxygen saturation of 97% on room air.   4:44 AM The patient has been continuously monitored and his oxygen  saturation has been 95 to 97% on room air.  He is only mildly tachypneic and is in no distress with no significantly increased work of breathing.  His chest x-ray is consistent with Covid pneumonia but as noted above he has already received the monoclonal antibody infusions.  His urinalysis is not consistent with dehydration although the presence of ketones does suggest decreased food intake.  If he were dehydrated I would expect tachycardia as well.  His claims of not urinating in days and not defecating in months are suspicious.  His request for last rights and concerns about dying suggest anxiety is a significant contributor to his symptomatology.  He does not meet admission criteria at this time but should return if symptoms worsen.  PROCEDURES  Procedures   ED DIAGNOSES     ICD-10-CM   1. Pneumonia due to COVID-19 virus  U07.1    J12.82   2. Anxiety about health  F41.8        Manna Gose, Jenny Reichmann, MD 07/07/20 [phone]

## 2020-07-08 NOTE — Telephone Encounter (Signed)
Thanks, Gaffney!

## 2020-07-11 ENCOUNTER — Telehealth: Payer: Self-pay | Admitting: Allergy & Immunology

## 2020-07-11 NOTE — Telephone Encounter (Signed)
Pt. Would like a call back from a nurse.

## 2020-07-12 ENCOUNTER — Other Ambulatory Visit: Payer: Self-pay | Admitting: Allergy & Immunology

## 2020-07-12 LAB — CULTURE, BLOOD (ROUTINE X 2)
Culture: NO GROWTH
Culture: NO GROWTH
Special Requests: ADEQUATE
Special Requests: ADEQUATE

## 2020-07-12 NOTE — Telephone Encounter (Signed)
Pt tested positive to covid on the 15th then went to hospital ed last week. He has pneumonia per hospital and he was not given any antibiotic. He has a lot of phlegm and doing all medications we have prescribed. He is doing his oxygen. He feels tired and out of breath with walking and feels he can not be off the oxygen. He is able to eat now, small portions, no meats, as he is having a hard time swallowing. He is concerned with the pneumonia , and why he was not given something for it, like a antibiotic? Please call, and talk to pt further,he is really upset about this not getting a antibiotic and having pneumonia. Thank you!

## 2020-07-13 ENCOUNTER — Ambulatory Visit: Payer: Medicare Other

## 2020-07-13 NOTE — Telephone Encounter (Signed)
Talked with this patient via phone yesterday. He reports that he is gradually feeling better with no fevers and his respiratory status is slowly improving. He continues oxygen between 2-5 LNC at all times and continues his asthma medication regimen. He has follow up appointments with his PCP and the [place] clinic already scheduled. He was feeling well until he looked at his summary on MY CHART and saw the chest xray indicated viral pneumonia. He was confused about difference between treatment for bacterial pneumonia and viral pneumonia. He did not have any questions at the end of the discussion. He will follow up with his PCP at the planned appointment and call this clinic with any further questions.

## 2020-07-15 ENCOUNTER — Other Ambulatory Visit: Payer: Self-pay | Admitting: Allergy & Immunology

## 2020-07-15 NOTE — Telephone Encounter (Signed)
PT called says he ran out of prednisone 10mg  and we have not responded to the [facility] refill request. Advised he has refills for prednisone 5mg  2x daily that is on file with [facility]. PT will follow up with pharmacy.

## 2020-07-21 ENCOUNTER — Telehealth: Payer: Self-pay

## 2020-07-21 ENCOUNTER — Other Ambulatory Visit: Payer: Self-pay

## 2020-07-21 DIAGNOSIS — J4550 Severe persistent asthma, uncomplicated: Secondary | ICD-10-CM

## 2020-07-21 MED ORDER — ALBUTEROL SULFATE (2.5 MG/3ML) 0.083% IN NEBU: 2.5000 mg | INHALATION_SOLUTION | Freq: Four times a day (QID) | RESPIRATORY_TRACT | 3 refills | Status: DC | PRN

## 2020-07-21 NOTE — Telephone Encounter (Signed)
Thank you for taking such excellent care of our high maintenance patients.  [name], MD [facility] [facility] [place]

## 2020-07-21 NOTE — Telephone Encounter (Signed)
Refill sent.

## 2020-07-21 NOTE — Telephone Encounter (Signed)
Called patient and left a message for him to call the office back.

## 2020-07-21 NOTE — Telephone Encounter (Signed)
Patient called stating his nebulizer machine just died. He talked to [facility] and they said if we fax an order over now they can get it out and delivered to him.   [phone]

## 2020-07-21 NOTE — Telephone Encounter (Signed)
Patient called to say that he would pick up the nebulizer from our office today. He wanted to get the papers faxed to [facility] to receive the nebulizer from them as well because the nebulizer that [facility] provides can hold all of his medications that he needs at one time where our nebulizer in office holds one medication at a time. I informed him that if he does receive two nebulizer's he could be charged for one if not both machines. He informed me that he would call [facility] himself to proceed with the process of getting a new nebulizer that will hold all of his medications

## 2020-07-21 NOTE — Telephone Encounter (Signed)
Patient called requesting a refill on his Albuterol for his Nebulizer.   [name]

## 2020-07-22 ENCOUNTER — Ambulatory Visit (INDEPENDENT_AMBULATORY_CARE_PROVIDER_SITE_OTHER): Payer: Medicare Other

## 2020-07-22 ENCOUNTER — Ambulatory Visit
Admission: EM | Admit: 2020-07-22 | Discharge: 2020-07-22 | Disposition: A | Discharge status: Home / Self Care | Payer: Medicare Other | Attending: Emergency Medicine | Admitting: Emergency Medicine

## 2020-07-22 ENCOUNTER — Encounter: Payer: Self-pay | Admitting: Emergency Medicine

## 2020-07-22 DIAGNOSIS — J189 Pneumonia, unspecified organism: Secondary | ICD-10-CM | POA: Diagnosis not present

## 2020-07-22 DIAGNOSIS — Z87891 Personal history of nicotine dependence: Secondary | ICD-10-CM | POA: Diagnosis not present

## 2020-07-22 DIAGNOSIS — Z8616 Personal history of COVID-19: Secondary | ICD-10-CM

## 2020-07-22 DIAGNOSIS — J1282 Pneumonia due to Coronavirus disease 2019: Secondary | ICD-10-CM

## 2020-07-22 MED ORDER — AZITHROMYCIN 250 MG PO TABS: 250.0000 mg | ORAL_TABLET | Freq: Every day | ORAL | 0 refills | Status: DC

## 2020-07-22 MED ORDER — PREDNISONE 50 MG PO TABS: 50.0000 mg | ORAL_TABLET | Freq: Every day | ORAL | 0 refills | Status: DC

## 2020-07-22 MED ORDER — DEXAMETHASONE SODIUM PHOSPHATE 10 MG/ML IJ SOLN
10.0000 mg | Freq: Once | INTRAMUSCULAR | Status: AC
  Administered 2020-07-22: 10 mg via INTRAMUSCULAR

## 2020-07-22 NOTE — ED Triage Notes (Addendum)
Patient in with complaints of SOB. Patient has labored breathing and is speaking in phrases due to SOB. Patient states nebulizer machine stopped working last night. Patient states that he uses O2 at home as needed. Patient has hx of asthma. Patient was diagnosed with COVID on 06/29/20. Patient states that he experienced constipation x 1 week. Patient states that he had to use an enema and last BM was this morning.

## 2020-07-22 NOTE — Telephone Encounter (Signed)
Joelene Millin from [employer] returned my call from 07/21/20 regarding when was the last date [name] received a nebulizer from [employer].  Per Deneen Harts received nebulizer from [employer] 07/2019.  Medicare and Medicare plans will only cover and pay for nebulizer 1 machine every 5 years.  Joelene Millin also stated she mailed out supplies yesterday after receiving my message because [name] had never called and asked for new tubing or filters since he has had nebulizer.  Joelene Millin states they will always trouble shoot issues with the patient and check machine in these cases.  Informed her we do tell patients to call [employer] to discuss when they call regarding their nebulizer or need supplies. Informed her [name] was calling the office multiple times and also was calling [place].  Patient was told and aware the nebulizer might not be covered depending on date he was given last machine.

## 2020-07-22 NOTE — ED Provider Notes (Signed)
[name]    CSN: [id number] Arrival date & time: 07/22/20  1532      History   Chief Complaint Chief Complaint  Patient presents with  . Shortness of Breath    HPI [name] is a 69 y.o. male  Presenting for increased dyspnea over the last few days since his nebulizer broke.  Patient went to ER for Covid pneumonia late last month.  Feels he has been stable since then.  Does endorse chest tightness which is typically alleviated by nebulizer.  Does have rescue inhaler as well.  Feels this is asthma flare.  Was not discharged from hospital with antibiotics.   Past Medical History:  Diagnosis Date  . Anxiety disorder   . Arthritis    BILATERAL SHOULDERS, ELBOWS AND HANDS AND LEFT HIP AND KNEES--HX CORTISONE SHOTS IN SHOULDERS, ELBOWS, HIP AND KNEES  . Basal cell carcinoma of back 04/30/2016   Dermatologist- Dr Nevada Crane;   MOHS sx- Dr Levada Dy   . Bladder outlet obstruction   . Chronic idiopathic constipation 05/08/2015  . Complication of anesthesia    DIFFICULT WAKING   . COPD (chronic obstructive pulmonary disease) Gold C Frequent exacerbations 01/13/2014   [name] 07/08/14: FeV1 51% FeV1/FVC 66% FVC 59% 10/5/2015ONO RA was normal  10/13/2014  ONO on RA NORMAL   . COPD, frequent exacerbations ([place])    pulmologist-  dr Joya Gaskins--  [place] Rm Stage C.04-25-15 recent COPD exacerbation-much improved now, after tx. in ER [facility].  Marland Kitchen Depression   . Diabetes mellitus without complication ([place])    BODERLINE - DIET CONTROL  . Dysrhythmia    PVC'S  . Emphysema lung ([place])    stage 2  . Family history of adverse reaction to anesthesia    father would wake up with agitation   . Former smoker 01/13/2014  . Gastroesophageal reflux disease without esophagitis   . Heavy alcohol use 04/30/2016  . History of chronic bronchitis   . History of oxygen administration    oxygen use 2 l/m nasally at bedtime and exertional occasions  . History of rheumatic fever   . History of TB  (tuberculosis)    1984--  hospitalized for 4 month treatment  . History of urinary retention   . Hx of multiple concussions    x 2 per patient   . Hypertension   . Melanoma ([place])   . Nocturnal oxygen desaturation    USES O2 NIGHTLY  . OSA (obstructive sleep apnea) 01/13/2014  . PONV (postoperative nausea and vomiting)   . Post-polio syndrome    polio at age 80--PT WAS IN IRON LUNG; PT WAS IN W/C UNTIL AGE 54; STILL HAS WEAKNESS RIGHT SIDE  . Prostate cancer ([place])   . Schizophrenia ([place])   . Tuberculosis    Hosp 4 months rx , left early   . Urticaria     Patient Active Problem List   Diagnosis Date Noted  . COPD with acute exacerbation ([place]) 12/04/2018  . PSVT (paroxysmal supraventricular tachycardia) ([place]) 09/23/2018  . Seasonal and perennial allergic rhinitis 07/30/2018  . Asthma-COPD overlap syndrome ([place]) 07/07/2018  . S/P arthroscopy of right shoulder 04/25/2017  . Generalized anxiety disorder 04/22/2017  . Severe persistent asthma without complication 03/88/8280  . Bee sting-induced anaphylaxis 02/12/2017  . Anaphylactic reaction due to food, initial encounter 02/12/2017  . Current use of beta blocker 02/12/2017  . Allergic rhinitis 01/15/2017  . COPD exacerbation ([place]) 11/04/2016  . Asthma exacerbation 11/03/2016  . Nausea without vomiting  09/17/2016  . Shortness of breath   . Diabetes mellitus type 2 in obese ([place]) 06/26/2016  . non-specific Chest pain 06/12/2016  . Patient's noncompliance with other medical treatment and regimen 06/02/2016  . Hyperlipidemia, mixed 06/02/2016  . Elevated liver enzymes  06/02/2016  . Schizophrenia ([place]) 04/30/2016  . Environmental and seasonal allergies 04/30/2016  . Diabetes mellitus without complication- (diet controlled; w/o proteinuria) 04/30/2016  . Heavy alcohol use- Many yrs  04/30/2016  . Basal cell carcinoma of back 04/30/2016  . Obesity 04/30/2016  . Hx of multiple concussions 04/30/2016  . h/o Vitamin D deficiency  04/30/2016  . Screen for sexually transmitted diseases 04/30/2016  . Immunocompromised state ([place]) 04/30/2016  . Cervical radiculopathy 01/15/2016  . Alcohol withdrawal ([place]) 11/22/2015  . Alcohol abuse with alcohol-induced mood disorder ([place]) 11/22/2015  . h/o Suicidal thoughts 11/21/2015  . Dry mouth 10/07/2015  . Anxiety 09/19/2015  . OAB (overactive bladder) 07/27/2015  . Chronic idiopathic constipation 05/08/2015  . Screening for colon cancer   . Benign neoplasm of cecum   . Benign neoplasm of ascending colon   . Benign neoplasm of transverse colon   . Benign neoplasm of sigmoid colon   . Rectal polyp   . Gastroesophageal reflux disease without esophagitis   . Esophageal stricture   . Joint stiffness of multiple sites 10/17/2014  . Benign localized hyperplasia of prostate with urinary obstruction 09/29/2014  . concerns for memory loss 09/13/2014  . Tuberculosis   . Diverticulum of bladder 06/17/2014  . Acute urinary retention 05/31/2014  . Neuropathy ([place]) 04/11/2014  . Post-polio syndrome 01/13/2014  . Other emphysema ([place]) 01/13/2014  . Hypertension 01/13/2014  . Former smoker 01/13/2014  . h/o Prostate cancer 01/13/2014  . History of tuberculosis 01/13/2014  . Bipolar disorder ([place]) 01/13/2014  . Explosive personality disorder ([place]) 01/13/2014  . Erectile dysfunction 01/13/2014  . Nephrolithiasis 01/13/2014  . Chronic pain 01/13/2014  . Obstructive sleep apnea 01/13/2014  . Alcoholic gastritis 08/11/2535  . Nocturnal hypoxia 08/10/2013    Past Surgical History:  Procedure Laterality Date  . CARDIOVASCULAR STRESS TEST  06-08-2014  dr Mare Ferrari   normal lexiscan study/  no ischemia/  not gated due to PAC's  . COLONOSCOPY N/A 05/03/2015   Procedure: COLONOSCOPY;  Surgeon: [name], MD;  Location: [facility];  Service: Endoscopy;  Laterality: N/A;  . CYSTOSCOPY N/A 10/25/2015   Procedure: CYSTOSCOPY;  Surgeon: [name], MD;  Location: [facility];  Service:  Urology;  Laterality: N/A;  . CYSTOSCOPY W/ CYSTOGRAM/  TRANSRECTAL ULTRASOUND PROSTATE BX  03-22-2009  . ESOPHAGOGASTRODUODENOSCOPY N/A 03/22/2015   Procedure: ESOPHAGOGASTRODUODENOSCOPY (EGD) with dilation;  Surgeon: [name], MD;  Location: [facility];  Service: Endoscopy;  Laterality: N/A;  . excision of skin lesion    . LAPAROSCOPIC CHOLECYSTECTOMY  2013  . left elbow surgery      due to fracture   . NASAL SEPTUM SURGERY  2000  . OTHER SURGICAL HISTORY      Muscle & bone Graft/Polio  . polio surgeries      14 polio surgeries   . PROSTATE BIOPSY N/A 09/28/2014   Procedure: PROSTATE ULTRASOUND/BIOPSY;  Surgeon: [name], MD;  Location: [facility];  Service: Urology;  Laterality: N/A;  . PROSTATE BIOPSY N/A 10/25/2015   Procedure: PROSTATE BIOPSY AND ULTRASOUND;  Surgeon: [name], MD;  Location: [facility];  Service: Urology;  Laterality: N/A;  . SAVORY DILATION N/A 03/22/2015   Procedure: SAVORY DILATION;  Surgeon: Jenny Reichmann  Delice Lesch, MD;  Location: [facility];  Service: Endoscopy;  Laterality: N/A;  . SHOULDER ARTHROSCOPY WITH OPEN ROTATOR CUFF REPAIR Bilateral 2013  &  1999   removal spurs and bursectomy  . TRANSURETHRAL INCISION OF BLADDER NECK N/A 10/25/2015   Procedure:  TRANSURETHRAL INCISION OF BLADDER NECK;  Surgeon: [name], MD;  Location: [facility];  Service: Urology;  Laterality: N/A;  . TRANSURETHRAL RESECTION OF PROSTATE N/A 09/28/2014   Procedure: TRANSURETHRAL RESECTION OF THE PROSTATE (TURP);  Surgeon: [name], MD;  Location: [facility];  Service: Urology;  Laterality: N/A;  . URETEROSOPY STONE EXTRACTION  2000       Home Medications    Prior to Admission medications   Medication Sig Start Date End Date Taking? Authorizing Provider  albuterol (PROVENTIL) (2.5 MG/3ML) 0.083% nebulizer solution Take 3 mLs (2.5 mg total) by nebulization every 6 (six) hours as needed for wheezing or shortness of breath. 07/21/20   Valentina Shaggy, MD  albuterol (VENTOLIN HFA) 108  (90 Base) MCG/ACT inhaler Inhale 2 puffs into the lungs every 6 (six) hours as needed for wheezing or shortness of breath. 07/01/20   Valentina Shaggy, MD  arformoterol (BROVANA) 15 MCG/2ML NEBU Take 2 mLs (15 mcg total) by nebulization 2 (two) times daily. 07/01/20   Valentina Shaggy, MD  azithromycin (ZITHROMAX) 250 MG tablet Take 1 tablet (250 mg total) by mouth daily. Take first 2 tablets together, then 1 every day until finished. 07/22/20   Hall-Potvin, [place], PA-C  baclofen (LIORESAL) 10 MG tablet Take 10 mg by mouth daily. 08/25/19   [provider]  budesonide (PULMICORT) 0.5 MG/2ML nebulizer solution Take 2 mLs (0.5 mg total) by nebulization 2 (two) times daily. 05/06/20   Valentina Shaggy, MD  colchicine 0.6 MG tablet TK 2 TS PO INITIALLY THEN TK 1 T IN 1 HOUR 09/20/19   [provider]  diazepam (VALIUM) 10 MG tablet Take 10 mg by mouth daily.    [provider]  diphenhydrAMINE (SOMINEX) 25 MG tablet Take by mouth.    [provider]  EPINEPHrine (EPIPEN 2-PAK) 0.3 mg/0.3 mL IJ SOAJ injection Inject 0.3 mLs (0.3 mg total) into the muscle as needed for anaphylaxis. 07/03/19   Valentina Shaggy, MD  ipratropium-albuterol (DUONEB) 0.5-2.5 (3) MG/3ML SOLN Take 3 mLs by nebulization every evening. 01/21/20   Valentina Shaggy, MD  loratadine (CLARITIN) 10 MG tablet Take 1 tablet (10 mg total) by mouth daily as needed for allergies. 01/18/20   Valentina Shaggy, MD  nitroGLYCERIN (NITROSTAT) 0.4 MG SL tablet Place 1 tablet (0.4 mg total) under the tongue every 5 (five) minutes as needed for chest pain. 06/21/15   Brunetta Jeans, PA-C  ondansetron (ZOFRAN ODT) 4 MG disintegrating tablet Take 1 tablet (4 mg total) by mouth every 8 (eight) hours as needed for nausea or vomiting. 07/06/20   Valentina Shaggy, MD  ondansetron (ZOFRAN ODT) 8 MG disintegrating tablet Take 1 tablet (8 mg total) by mouth every 8 (eight) hours as needed for nausea  or vomiting. 07/07/20   Molpus, John, MD  OXYGEN Inhale 2-3 L into the lungs at bedtime. DURING THE DAY IF NEEDED Reported on 11/25/2015    [provider]  pantoprazole (PROTONIX) 40 MG tablet Take 1 tablet (40 mg total) by mouth daily. 01/18/20   Valentina Shaggy, MD  predniSONE (DELTASONE) 50 MG tablet Take 1 tablet (50 mg total) by mouth daily with breakfast. 07/22/20  Hall-Potvin, [place], PA-C  Respiratory Therapy Supplies (NEBULIZER/TUBING/MOUTHPIECE) KIT 1 each by Does not apply route as directed. 07/24/19   Valentina Shaggy, MD    Family History Family History  Problem Relation Age of Onset  . Alzheimer's disease Father 4       Deceased  . Stomach cancer Father   . Heart attack Father   . Heart disease Father   . Skin cancer Mother        Facial-Living  . Alcohol abuse Sister        x2  . Mental illness Sister        x2  . Alcohol abuse Sister   . Diabetes Maternal Aunt        x2  . Thyroid disease Maternal Aunt        x4  . Diabetes Maternal Uncle   . Tuberculosis Paternal Grandfather   . Tuberculosis Paternal Grandmother   . Alzheimer's disease Paternal Aunt   . Alzheimer's disease Paternal Uncle   . Colon cancer Neg Hx   . Colon polyps Neg Hx   . Crohn's disease Neg Hx   . Ulcerative colitis Neg Hx   . Allergic rhinitis Neg Hx   . Angioedema Neg Hx   . Asthma Neg Hx   . Eczema Neg Hx   . Urticaria Neg Hx     Social History Social History   Tobacco Use  . Smoking status: Former Smoker    Years: 47.00    Types: Pipe    Quit date: 08/25/2013    Years since quitting: 6.9  . Smokeless tobacco: Former Systems developer    Types: Chew  . Tobacco comment: 5-6 times smoking a pipe  Vaping Use  . Vaping Use: Never used  Substance Use Topics  . Alcohol use: Yes    Alcohol/week: 14.0 standard drinks    Types: 14 Cans of beer per week    Comment: 2 cans daily  . Drug use: No     Allergies   Aspirin, Hornet venom, Ivp dye [iodinated diagnostic  agents], Levaquin [levofloxacin in d5w], Nsaids, Penicillins, Tolmetin, Fasenra [benralizumab], Perforomist [formoterol], Buprenorphine hcl, Morphine and related, and Oxycodone   Review of Systems As per HPI   Physical Exam Triage Vital Signs ED Triage Vitals [07/22/20 1537]  Enc Vitals Group     BP (!) 146/93     Pulse Rate (!) 111     Resp      Temp      Temp src      SpO2 90 %     Weight      Height      Head Circumference      Peak Flow      Pain Score      Pain Loc      Pain Edu?      Excl. in [place]?    No data found.  Updated Vital Signs BP (!) 146/93 (BP Location: Left Arm)   Pulse (!) 111   Resp (!) 28   SpO2 90%   Visual Acuity Right Eye Distance:   Left Eye Distance:   Bilateral Distance:    Right Eye Near:   Left Eye Near:    Bilateral Near:     Physical Exam Constitutional:      General: He is not in acute distress.    Appearance: He is well-developed. He is obese. He is not toxic-appearing.  HENT:     Head: Normocephalic and atraumatic.  Eyes:  General: No scleral icterus.    Pupils: Pupils are equal, round, and reactive to light.  Neck:     Vascular: No JVD.     Trachea: No tracheal deviation.  Cardiovascular:     Rate and Rhythm: Regular rhythm. Tachycardia present.     Comments: HR 103 at bedside Pulmonary:     Effort: Pulmonary effort is normal. No tachypnea, accessory muscle usage or respiratory distress.     Breath sounds: Decreased breath sounds present. No wheezing.     Comments: Pt very talkative Lymphadenopathy:     Cervical: No cervical adenopathy.  Skin:    Coloration: Skin is not jaundiced or pale.  Neurological:     Mental Status: He is alert and oriented to person, place, and time.      UC Treatments / Results  Labs (all labs ordered are listed, but only abnormal results are displayed) Labs Reviewed - No data to display  EKG   Radiology DG Chest 2 View  Result Date: 07/22/2020 CLINICAL DATA:  Continued  shortness of breath. COVID 19 positive three weeks ago. EXAM: CHEST - 2 VIEW COMPARISON:  Chest x-ray dated July 07, 2020. FINDINGS: The heart size and mediastinal contours are within normal limits. Normal pulmonary vascularity. Patchy peripheral and basilar predominant opacities have mildly progressed since the prior study. No pleural effusion or pneumothorax. No acute osseous abnormality. IMPRESSION: 1. Mildly progressed multifocal pneumonia. Electronically Signed   By: Titus Dubin M.D.   On: 07/22/2020 16:34    Procedures Procedures (including critical care time)  Medications Ordered in UC Medications  dexamethasone (DECADRON) injection 10 mg (10 mg Intramuscular Given 07/22/20 1603)    Initial Impression / Assessment and Plan / UC Course  I have reviewed the triage vital signs and the nursing notes.  Pertinent labs & imaging results that were available during my care of the patient were reviewed by me and considered in my medical decision making (see chart for details).     Afebrile, nontoxic in office today.  Patient initially tachypneic, tachycardic with labored breathing, though this resolved with rest.  Patient chronically on 2 L O2 via [place].  Given 4 L via [place] here in office.EKG done in office, reviewed by me and compared to previous from 07/07/2020: Sinus tachycardia with single PVC noted.  Ventricular rate 108 bpm.  No QTC prolongation.  Waveforms stable in all leads: Nonacute EKG. chest x-ray with mildly progressed multifocal pneumonia.  Patient has numerous allergies including penicillin, Levaquin.  Will give Z-Pak, prednisone, and have strict/short-term ER return precautions.  Discussed for/benefits of current treatment plan as opposed to going to ER for further evaluation/management.  Patient electing to try outpatient therapy.  Return precautions discussed, pt verbalized understanding and is agreeable to plan. Final Clinical Impressions(s) / UC Diagnoses   Final diagnoses:    Multifocal pneumonia   Discharge Instructions   None    ED Prescriptions    Medication Sig Dispense Auth. Provider   azithromycin (ZITHROMAX) 250 MG tablet Take 1 tablet (250 mg total) by mouth daily. Take first 2 tablets together, then 1 every day until finished. 6 tablet Hall-Potvin, [place], PA-C   predniSONE (DELTASONE) 50 MG tablet Take 1 tablet (50 mg total) by mouth daily with breakfast. 5 tablet Hall-Potvin, [place], PA-C     PDMP not reviewed this encounter.   Hall-Potvin, [place], [place] 07/22/20 1707

## 2020-07-25 NOTE — Telephone Encounter (Signed)
Please advice . Thank you

## 2020-07-25 NOTE — Telephone Encounter (Signed)
Patient's wife states again that the neb machine that was picked up on Thursday was not strong enough. Patient states the vapors do not go "down his throat" like the heavy duty machine did and it makes him choke. Patient's wife states that Dr. Ernst Bowler prescribed the heavy duty machine 5 years ago according to [facility].  Patient went to Urgent Care on Friday and was given 2 shots, prednisone for 1 time a day and a Zpak.  Please advise.

## 2020-07-25 NOTE — Telephone Encounter (Signed)
Patient called stating the machine he received from our office isnt enough horse power to get down his throat?. Patient states [facility] has a machine that's bigger and powerful to get down in his throat. He needs an order to be sent to [facility] today so he can get a better one.   Patient also wanted to update Dr Ernst Bowler regarding his visit to the ED on 07/22/2020. Patients prednisone has been increased to 50mg . He is also on antibiotics again.   Please advise.Marland KitchenMarland KitchenMarland Kitchen

## 2020-07-25 NOTE — Telephone Encounter (Signed)
Called and left a message for patient to call the office back.  

## 2020-07-26 NOTE — Telephone Encounter (Signed)
Please advise thank you

## 2020-07-26 NOTE — Telephone Encounter (Signed)
I have absolutely no idea what he is talking about.  I was not even here 5 years ago, so if he is referring to a machine prescribed at that time I was not involved. Lonn [place] - can we send a prescription for nebulizer machine to [facility] to see what they can do?  [name], MD [facility] [place] [facility] [place]

## 2020-07-26 NOTE — Telephone Encounter (Signed)
Nebulizer machine sent to [facility].  Will need to contact [facility] during normal business hours to see if order was received.

## 2020-07-26 NOTE — Addendum Note (Signed)
Addended by: Valere Dross on: 07/26/2020 05:34 PM   Modules accepted: Orders

## 2020-07-27 ENCOUNTER — Telehealth: Payer: Self-pay | Admitting: Allergy & Immunology

## 2020-07-27 ENCOUNTER — Ambulatory Visit (INDEPENDENT_AMBULATORY_CARE_PROVIDER_SITE_OTHER): Payer: Medicare Other | Admitting: Nurse Practitioner

## 2020-07-27 VITALS — BP 148/92 | HR 91 | Temp 97.7°F | Wt 207.0 lb

## 2020-07-27 DIAGNOSIS — J1282 Pneumonia due to coronavirus disease 2019: Secondary | ICD-10-CM | POA: Diagnosis not present

## 2020-07-27 DIAGNOSIS — U071 COVID-19: Secondary | ICD-10-CM

## 2020-07-27 MED ORDER — PREDNISONE 20 MG PO TABS: ORAL_TABLET | ORAL | 0 refills | Status: DC

## 2020-07-27 NOTE — Telephone Encounter (Signed)
Patient wife called and said they need the nebulizer meds called into [facility]. [phone].

## 2020-07-27 NOTE — Progress Notes (Signed)
'@Patient'  ID: [name], male    DOB: 12/05/1950, 69 y.o.   MRN: [id number]  Chief Complaint  Patient presents with  . New Patient (Initial Visit)    COVID Pos: 9/15 On O2 PRN 2-4L Sx: SOB,     Referring provider: Cena Benton*  69 year old male with history of hypertension, PSVT, COPD, OSA, allergic rhinitis, asthma, GERD, post polio syndrome, history of rheumatic fever, bipolar disorder.  Timeline of current illness:  06/29/20 - tested + for covid  07/01/20 - Urgent care - no prescriptions  07/03/20 - MAB infusion  07/06/20 - ED visit - diagnosed with Covid PNA - prescribed azithromycin, prednisone  HPI  Patient presents today for post COVID care clinic visit/ED follow-up.  This patient states that since 07/06/2020 at his last ED visit he has been feeling like he is slowly improving.  Patient continues to use 2 to 4 L of oxygen.  Patient was on oxygen before he was diagnosed with Covid.  Patient states that he is still having shortness of breath with exertion.  He still does not feel that he is quite back to his baseline.  He is try to stay active and does do deep breathing exercises.  He has been taking his azithromycin and prednisone as directed.  Patient does have a pulse oximeter at home and does check his O2 sats regularly.  Denies f/c/s, n/v/d, hemoptysis, PND, chest pain or edema.      Allergies  Allergen Reactions  . Aspirin Anaphylaxis  . Hornet Venom Anaphylaxis  . Ivp Dye [Iodinated Diagnostic Agents] Anaphylaxis  . Levaquin [Levofloxacin In D5w] Shortness Of Breath and Swelling    In addition: sweating, chest pain, and diarrhea.   . Nsaids Anaphylaxis  . Penicillins Anaphylaxis    Heart stops Has patient had a PCN reaction causing immediate rash, facial/tongue/throat swelling, SOB or lightheadedness with hypotension: yes Has patient had a PCN reaction causing severe rash involving mucus membranes or skin necrosis: NO Has patient had a PCN reaction  that required hospitalization yes Has patient had a PCN reaction occurring within the last 10 years: No If all of the above answers are "NO", then may proceed with Cephalosporin use.   . Tolmetin Anaphylaxis  . Fasenra [Benralizumab] Hives  . Perforomist [Formoterol]     Increased wheezing, shortness of breath  . Buprenorphine Hcl Nausea And Vomiting    Can take with zofran   . Morphine And Related Nausea And Vomiting    Can take with zofran   . Oxycodone Itching and Rash    Immunization History  Administered Date(s) Administered  . Influenza Split 07/15/2013  . Influenza, High Dose Seasonal PF 07/19/2016, 09/27/2017  . Influenza, Seasonal, Injecte, Preservative Fre 07/15/2013, 07/28/2013, 07/14/2014, 03/14/2015, 09/06/2015  . Influenza,inj,Quad PF,6+ Mos 07/14/2014, 09/06/2015  . Influenza,inj,quad, With Preservative 08/29/2018  . Influenza-Unspecified 03/14/2015  . Pneumococcal Conjugate-13 08/13/2014  . Pneumococcal Polysaccharide-23 08/15/2010, 06/21/2015    Past Medical History:  Diagnosis Date  . Anxiety disorder   . Arthritis    BILATERAL SHOULDERS, ELBOWS AND HANDS AND LEFT HIP AND KNEES--HX CORTISONE SHOTS IN SHOULDERS, ELBOWS, HIP AND KNEES  . Basal cell carcinoma of back 04/30/2016   Dermatologist- Dr Nevada Crane;   MOHS sx- Dr Levada Dy   . Bladder outlet obstruction   . Chronic idiopathic constipation 05/08/2015  . Complication of anesthesia    DIFFICULT WAKING   . COPD (chronic obstructive pulmonary disease) Gold C Frequent exacerbations 01/13/2014   [name] 07/08/14: Yolonda Kida  51% FeV1/FVC 66% FVC 59% 10/5/2015ONO RA was normal  10/13/2014  ONO on RA NORMAL   . COPD, frequent exacerbations ([place])    pulmologist-  dr Joya Gaskins--  [place] Rm Stage C.04-25-15 recent COPD exacerbation-much improved now, after tx. in ER [facility].  Marland Kitchen Depression   . Diabetes mellitus without complication ([place])    BODERLINE - DIET CONTROL  . Dysrhythmia    PVC'S  . Emphysema lung ([place])    stage 2  . Family  history of adverse reaction to anesthesia    father would wake up with agitation   . Former smoker 01/13/2014  . Gastroesophageal reflux disease without esophagitis   . Heavy alcohol use 04/30/2016  . History of chronic bronchitis   . History of oxygen administration    oxygen use 2 l/m nasally at bedtime and exertional occasions  . History of rheumatic fever   . History of TB (tuberculosis)    1984--  hospitalized for 4 month treatment  . History of urinary retention   . Hx of multiple concussions    x 2 per patient   . Hypertension   . Melanoma ([place])   . Nocturnal oxygen desaturation    USES O2 NIGHTLY  . OSA (obstructive sleep apnea) 01/13/2014  . PONV (postoperative nausea and vomiting)   . Post-polio syndrome    polio at age 49--PT WAS IN IRON LUNG; PT WAS IN W/C UNTIL AGE 88; STILL HAS WEAKNESS RIGHT SIDE  . Prostate cancer ([place])   . Schizophrenia ([place])   . Tuberculosis    Hosp 4 months rx , left early   . Urticaria     Tobacco History: Social History   Tobacco Use  Smoking Status Former Smoker  . Years: 47.00  . Types: Pipe  . Quit date: 08/25/2013  . Years since quitting: 6.9  Smokeless Tobacco Former Systems developer  . Types: Chew  Tobacco Comment   5-6 times smoking a pipe   Counseling given: Not Answered Comment: 5-6 times smoking a pipe   Outpatient Encounter Medications as of 07/27/2020  Medication Sig  . albuterol (PROVENTIL) (2.5 MG/3ML) 0.083% nebulizer solution Take 3 mLs (2.5 mg total) by nebulization every 6 (six) hours as needed for wheezing or shortness of breath.  Marland Kitchen albuterol (VENTOLIN HFA) 108 (90 Base) MCG/ACT inhaler Inhale 2 puffs into the lungs every 6 (six) hours as needed for wheezing or shortness of breath.  Marland Kitchen arformoterol (BROVANA) 15 MCG/2ML NEBU Take 2 mLs (15 mcg total) by nebulization 2 (two) times daily.  Marland Kitchen azithromycin (ZITHROMAX) 250 MG tablet Take 1 tablet (250 mg total) by mouth daily. Take first 2 tablets together, then 1 every day until  finished.  . baclofen (LIORESAL) 10 MG tablet Take 10 mg by mouth daily.  . budesonide (PULMICORT) 0.5 MG/2ML nebulizer solution Take 2 mLs (0.5 mg total) by nebulization 2 (two) times daily.  . colchicine 0.6 MG tablet TK 2 TS PO INITIALLY THEN TK 1 T IN 1 HOUR  . diazepam (VALIUM) 10 MG tablet Take 10 mg by mouth daily.  . diphenhydrAMINE (SOMINEX) 25 MG tablet Take by mouth.  . EPINEPHrine (EPIPEN 2-PAK) 0.3 mg/0.3 mL IJ SOAJ injection Inject 0.3 mLs (0.3 mg total) into the muscle as needed for anaphylaxis.  Marland Kitchen ipratropium-albuterol (DUONEB) 0.5-2.5 (3) MG/3ML SOLN Take 3 mLs by nebulization every evening.  . loratadine (CLARITIN) 10 MG tablet Take 1 tablet (10 mg total) by mouth daily as needed for allergies.  . nitroGLYCERIN (NITROSTAT) 0.4 MG  SL tablet Place 1 tablet (0.4 mg total) under the tongue every 5 (five) minutes as needed for chest pain.  Marland Kitchen ondansetron (ZOFRAN ODT) 4 MG disintegrating tablet Take 1 tablet (4 mg total) by mouth every 8 (eight) hours as needed for nausea or vomiting.  . ondansetron (ZOFRAN ODT) 8 MG disintegrating tablet Take 1 tablet (8 mg total) by mouth every 8 (eight) hours as needed for nausea or vomiting.  . OXYGEN Inhale 2-3 L into the lungs at bedtime. DURING THE DAY IF NEEDED Reported on 11/25/2015  . pantoprazole (PROTONIX) 40 MG tablet Take 1 tablet (40 mg total) by mouth daily.  . predniSONE (DELTASONE) 50 MG tablet Take 1 tablet (50 mg total) by mouth daily with breakfast.  . Respiratory Therapy Supplies (NEBULIZER/TUBING/MOUTHPIECE) KIT 1 each by Does not apply route as directed.  . predniSONE (DELTASONE) 20 MG tablet Take 2 tablets x 3 days, then take 1 tablet x 3 days   No facility-administered encounter medications on file as of 07/27/2020.     Review of Systems  Review of Systems  Constitutional: Positive for fatigue. Negative for fever.  HENT: Negative.   Respiratory: Positive for cough and shortness of breath.   Cardiovascular: Negative.    Gastrointestinal: Negative.   Allergic/Immunologic: Negative.   Neurological: Negative.   Psychiatric/Behavioral: Negative.        Physical Exam  BP (!) 148/92 (BP Location: Right Arm)   Pulse 91   Temp 97.7 F (36.5 C)   Wt 207 lb (93.9 kg)   SpO2 97%   BMI 28.07 kg/m   Wt Readings from Last 5 Encounters:  07/27/20 207 lb (93.9 kg)  07/07/20 218 lb 0.6 oz (98.9 kg)  06/29/20 218 lb (98.9 kg)  01/18/20 234 lb (106.1 kg)  06/29/19 234 lb (106.1 kg)     Physical Exam Vitals and nursing note reviewed.  Constitutional:      General: He is not in acute distress.    Appearance: He is well-developed.  Cardiovascular:     Rate and Rhythm: Normal rate and regular rhythm.  Pulmonary:     Effort: Pulmonary effort is normal.     Breath sounds: Normal breath sounds.  Musculoskeletal:     Right lower leg: No edema.     Left lower leg: No edema.  Skin:    General: Skin is warm and dry.  Neurological:     Mental Status: He is alert and oriented to person, place, and time.  Psychiatric:        Mood and Affect: Mood normal.        Behavior: Behavior normal.      Lab Results:  CBC    Component Value Date/Time   WBC 10.5 07/07/2020 0040   RBC 4.89 07/07/2020 0040   HGB 15.8 07/07/2020 0040   HGB 16.6 06/29/2020 1723   HCT 46.1 07/07/2020 0040   HCT 47.8 06/29/2020 1723   PLT 285 07/07/2020 0040   PLT 166 06/29/2020 1723   MCV 94.3 07/07/2020 0040   MCV 93 06/29/2020 1723   MCH 32.3 07/07/2020 0040   MCHC 34.3 07/07/2020 0040   RDW 12.2 07/07/2020 0040   RDW 12.4 06/29/2020 1723   LYMPHSABS 0.4 (L) 07/07/2020 0040   LYMPHSABS 0.6 (L) 06/29/2020 1723   MONOABS 0.6 07/07/2020 0040   EOSABS 0.0 07/07/2020 0040   EOSABS 0.0 06/29/2020 1723   BASOSABS 0.0 07/07/2020 0040   BASOSABS 0.0 06/29/2020 1723    BMET  Component Value Date/Time   NA 136 07/07/2020 0040   NA 133 (L) 06/29/2020 1723   K 4.3 07/07/2020 0040   CL 96 (L) 07/07/2020 0040   CO2 28  07/07/2020 0040   GLUCOSE 150 (H) 07/07/2020 0040   BUN 12 07/07/2020 0040   BUN 16 06/29/2020 1723   CREATININE 1.11 07/07/2020 0040   CREATININE 1.22 05/07/2016 1024   CALCIUM 9.0 07/07/2020 0040   GFRNONAA >60 07/07/2020 0040   GFRNONAA 61 05/07/2016 1024   GFRAA >60 07/07/2020 0040   GFRAA 71 05/07/2016 1024    BNP No results found for: BNP  ProBNP No results found for: PROBNP  Imaging: DG Chest 2 View  Result Date: 07/22/2020 CLINICAL DATA:  Continued shortness of breath. COVID 19 positive three weeks ago. EXAM: CHEST - 2 VIEW COMPARISON:  Chest x-ray dated July 07, 2020. FINDINGS: The heart size and mediastinal contours are within normal limits. Normal pulmonary vascularity. Patchy peripheral and basilar predominant opacities have mildly progressed since the prior study. No pleural effusion or pneumothorax. No acute osseous abnormality. IMPRESSION: 1. Mildly progressed multifocal pneumonia. Electronically Signed   By: Titus Dubin M.D.   On: 07/22/2020 16:34   DG Chest Port 1 View  Result Date: 07/07/2020 CLINICAL DATA:  COVID pneumonia, dyspnea EXAM: PORTABLE CHEST 1 VIEW COMPARISON:  07/23/2019 FINDINGS: Lung volumes are small. There are superimposed asymmetric left mid lung zone and bibasilar pulmonary infiltrates, likely infectious or inflammatory in nature. No pneumothorax or pleural effusion. Cardiac size within normal limits. Pulmonary vascularity is normal. IMPRESSION: Low lung volumes. Superimposed asymmetric bilateral pulmonary infiltrates, likely infectious or inflammatory. Electronically Signed   By: Fidela Salisbury MD   On: 07/07/2020 00:38     Assessment & Plan:   Pneumonia due to COVID-19 virus Cough:   Stay well hydrated  Stay active  Deep breathing exercises  May start vitamin C 2,000 mg daily, vitamin D3 2,000 IU daily, Zinc 220 mg daily, and Quercetin 500 mg twice daily  May take tylenol or fever or pain  May take mucinex twice  daily  Will order prednisone to taper off current 50 mg  Continue O2 as before  Continue all other medications   Follow up:  Follow up in 4 weeks or sooner if needed - will need chest xray      Fenton Foy, NP 07/27/2020

## 2020-07-27 NOTE — Telephone Encounter (Signed)
Patient call to check on this prescription for a nebulizer that was to be sent to [facility]. He said it was supposed to have been sent last week and he has heard nothing.

## 2020-07-27 NOTE — Patient Instructions (Signed)
Covid 19 Cough:   Stay well hydrated  Stay active  Deep breathing exercises  May start vitamin C 2,000 mg daily, vitamin D3 2,000 IU daily, Zinc 220 mg daily, and Quercetin 500 mg twice daily  May take tylenol or fever or pain  May take mucinex twice daily  Will order prednisone to taper off current 50 mg  Continue O2 as before  Continue all other medications   Follow up:  Follow up in 4 weeks or sooner if needed - will need chest xray

## 2020-07-27 NOTE — Assessment & Plan Note (Signed)
Cough:   Stay well hydrated  Stay active  Deep breathing exercises  May start vitamin C 2,000 mg daily, vitamin D3 2,000 IU daily, Zinc 220 mg daily, and Quercetin 500 mg twice daily  May take tylenol or fever or pain  May take mucinex twice daily  Will order prednisone to taper off current 50 mg  Continue O2 as before  Continue all other medications   Follow up:  Follow up in 4 weeks or sooner if needed - will need chest xray

## 2020-07-27 NOTE — Telephone Encounter (Signed)
I called [facility] and spoke to a woman named Tiffany. I ask her if they had any orders from our office for [name] to receive a machine from them, she said they didn't have any orders but they had already spoken to someone in his line of care that said they were going to pick up a machine from their facility today.  I asked if they needed anything from our office and Tiffany replied that they had a machine ready for him and that [name] family was notified to pick up the machine from them later today. I called and left a message for patient to pick his machine from [facility] today and they were waiting on him.

## 2020-08-01 ENCOUNTER — Telehealth: Payer: Self-pay

## 2020-08-01 NOTE — Telephone Encounter (Signed)
Patient called to get a refill on his prednisone as he is on his last pill & will be out after today.   Please Advise.   [address]

## 2020-08-01 NOTE — Telephone Encounter (Signed)
Pt was last seen 05/06/2020. Is due for office visit this month. Is refill appropriate?

## 2020-08-02 ENCOUNTER — Other Ambulatory Visit: Payer: Self-pay

## 2020-08-02 MED ORDER — PREDNISONE 50 MG PO TABS
50.0000 mg | ORAL_TABLET | Freq: Every day | ORAL | 0 refills | Status: DC
Start: 2020-08-02 — End: 2020-08-17

## 2020-08-02 NOTE — Telephone Encounter (Signed)
Please send a 1 month refill only.  [name], MD [facility]

## 2020-08-15 ENCOUNTER — Telehealth: Payer: Self-pay

## 2020-08-15 NOTE — Telephone Encounter (Signed)
Patient called and stated that he is having trouble breathing. He isn't sure if it's asthma or more to do with him having covid a while back. I asked patient if he had taken any medication to help him with these issues. Patient says he's doing his daily medications and he has used his rescue inhaler. I asked patient if he's noticed any relief, patient said no. I advised patient to seek medical attention if he feels he's getting worse or no relief before hearing back from our office. Patient says he's trying to [facility] hospitals and would rather know what to get over the counter. Patient also noted that he's oxygen levels have been in the lower 80's and before they were in the lower to mid 90's once cleared from ENT last week. I once again advised with the sudden decrease in oxygen levers it's best to be seen right away with the breathing issues, again patient expressed his opinion to avoid hospitals at this time. Please advise.

## 2020-08-16 ENCOUNTER — Ambulatory Visit (INDEPENDENT_AMBULATORY_CARE_PROVIDER_SITE_OTHER): Payer: Medicare Other

## 2020-08-16 ENCOUNTER — Ambulatory Visit
Admission: EM | Admit: 2020-08-16 | Discharge: 2020-08-16 | Disposition: A | Discharge status: Home / Self Care | Payer: Medicare Other | Attending: Emergency Medicine | Admitting: Emergency Medicine

## 2020-08-16 DIAGNOSIS — Z8616 Personal history of COVID-19: Secondary | ICD-10-CM

## 2020-08-16 DIAGNOSIS — R0600 Dyspnea, unspecified: Secondary | ICD-10-CM

## 2020-08-16 DIAGNOSIS — R0602 Shortness of breath: Secondary | ICD-10-CM | POA: Diagnosis not present

## 2020-08-16 NOTE — ED Provider Notes (Signed)
[name]    CSN: [id number] Arrival date & time: 08/16/20  1358      History   Chief Complaint Chief Complaint  Patient presents with  . Shortness of Breath    HPI [name] is a 69 y.o. male  With extensive medical history as below presenting for persistent cough and worsening dyspnea.  States dyspnea has increased over the last 4 days: Has placed a call to his pulmonologist, though has not heard back.  Patient is well-known to me: Please see attached visit records.  Did have Covid with Covid pneumonia.  Has completed course of both antibiotics and pneumonia.  States he is using his home O2 during the day.  Denies chest pain, palpitations, lightheadedness, dizziness.  Past Medical History:  Diagnosis Date  . Anxiety disorder   . Arthritis    BILATERAL SHOULDERS, ELBOWS AND HANDS AND LEFT HIP AND KNEES--HX CORTISONE SHOTS IN SHOULDERS, ELBOWS, HIP AND KNEES  . Basal cell carcinoma of back 04/30/2016   Dermatologist- Dr Nevada Crane;   MOHS sx- Dr Levada Dy   . Bladder outlet obstruction   . Chronic idiopathic constipation 05/08/2015  . Complication of anesthesia    DIFFICULT WAKING   . COPD (chronic obstructive pulmonary disease) Gold C Frequent exacerbations 01/13/2014   [name] 07/08/14: FeV1 51% FeV1/FVC 66% FVC 59% 10/5/2015ONO RA was normal  10/13/2014  ONO on RA NORMAL   . COPD, frequent exacerbations ([place])    pulmologist-  dr Joya Gaskins--  [place] Rm Stage C.04-25-15 recent COPD exacerbation-much improved now, after tx. in ER [facility].  Marland Kitchen Depression   . Diabetes mellitus without complication ([place])    BODERLINE - DIET CONTROL  . Dysrhythmia    PVC'S  . Emphysema lung ([place])    stage 2  . Family history of adverse reaction to anesthesia    father would wake up with agitation   . Former smoker 01/13/2014  . Gastroesophageal reflux disease without esophagitis   . Heavy alcohol use 04/30/2016  . History of chronic bronchitis   . History of oxygen administration    oxygen  use 2 l/m nasally at bedtime and exertional occasions  . History of rheumatic fever   . History of TB (tuberculosis)    1984--  hospitalized for 4 month treatment  . History of urinary retention   . Hx of multiple concussions    x 2 per patient   . Hypertension   . Melanoma ([place])   . Nocturnal oxygen desaturation    USES O2 NIGHTLY  . OSA (obstructive sleep apnea) 01/13/2014  . PONV (postoperative nausea and vomiting)   . Post-polio syndrome    polio at age 63--PT WAS IN IRON LUNG; PT WAS IN W/C UNTIL AGE 79; STILL HAS WEAKNESS RIGHT SIDE  . Prostate cancer ([place])   . Schizophrenia ([place])   . Tuberculosis    Hosp 4 months rx , left early   . Urticaria     Patient Active Problem List   Diagnosis Date Noted  . Pneumonia due to COVID-19 virus 07/27/2020  . COPD with acute exacerbation ([place]) 12/04/2018  . PSVT (paroxysmal supraventricular tachycardia) ([place]) 09/23/2018  . Seasonal and perennial allergic rhinitis 07/30/2018  . Asthma-COPD overlap syndrome ([place]) 07/07/2018  . S/P arthroscopy of right shoulder 04/25/2017  . Generalized anxiety disorder 04/22/2017  . Severe persistent asthma without complication 59/74/1638  . Bee sting-induced anaphylaxis 02/12/2017  . Anaphylactic reaction due to food, initial encounter 02/12/2017  . Current use of beta  blocker 02/12/2017  . Allergic rhinitis 01/15/2017  . COPD exacerbation ([place]) 11/04/2016  . Asthma exacerbation 11/03/2016  . Nausea without vomiting 09/17/2016  . Shortness of breath   . Diabetes mellitus type 2 in obese ([place]) 06/26/2016  . non-specific Chest pain 06/12/2016  . Patient's noncompliance with other medical treatment and regimen 06/02/2016  . Hyperlipidemia, mixed 06/02/2016  . Elevated liver enzymes  06/02/2016  . Schizophrenia ([place]) 04/30/2016  . Environmental and seasonal allergies 04/30/2016  . Diabetes mellitus without complication- (diet controlled; w/o proteinuria) 04/30/2016  . Heavy alcohol use- Many yrs   04/30/2016  . Basal cell carcinoma of back 04/30/2016  . Obesity 04/30/2016  . Hx of multiple concussions 04/30/2016  . h/o Vitamin D deficiency 04/30/2016  . Screen for sexually transmitted diseases 04/30/2016  . Immunocompromised state ([place]) 04/30/2016  . Cervical radiculopathy 01/15/2016  . Alcohol withdrawal ([place]) 11/22/2015  . Alcohol abuse with alcohol-induced mood disorder ([place]) 11/22/2015  . h/o Suicidal thoughts 11/21/2015  . Dry mouth 10/07/2015  . Anxiety 09/19/2015  . OAB (overactive bladder) 07/27/2015  . Chronic idiopathic constipation 05/08/2015  . Screening for colon cancer   . Benign neoplasm of cecum   . Benign neoplasm of ascending colon   . Benign neoplasm of transverse colon   . Benign neoplasm of sigmoid colon   . Rectal polyp   . Gastroesophageal reflux disease without esophagitis   . Esophageal stricture   . Joint stiffness of multiple sites 10/17/2014  . Benign localized hyperplasia of prostate with urinary obstruction 09/29/2014  . concerns for memory loss 09/13/2014  . Tuberculosis   . Diverticulum of bladder 06/17/2014  . Acute urinary retention 05/31/2014  . Neuropathy ([place]) 04/11/2014  . Post-polio syndrome 01/13/2014  . Other emphysema ([place]) 01/13/2014  . Hypertension 01/13/2014  . Former smoker 01/13/2014  . h/o Prostate cancer 01/13/2014  . History of tuberculosis 01/13/2014  . Bipolar disorder ([place]) 01/13/2014  . Explosive personality disorder ([place]) 01/13/2014  . Erectile dysfunction 01/13/2014  . Nephrolithiasis 01/13/2014  . Chronic pain 01/13/2014  . Obstructive sleep apnea 01/13/2014  . Alcoholic gastritis 06/00/4599  . Nocturnal hypoxia 08/10/2013    Past Surgical History:  Procedure Laterality Date  . CARDIOVASCULAR STRESS TEST  06-08-2014  dr Mare Ferrari   normal lexiscan study/  no ischemia/  not gated due to PAC's  . COLONOSCOPY N/A 05/03/2015   Procedure: COLONOSCOPY;  Surgeon: [name], MD;  Location: [facility];   Service: Endoscopy;  Laterality: N/A;  . CYSTOSCOPY N/A 10/25/2015   Procedure: CYSTOSCOPY;  Surgeon: [name], MD;  Location: [facility];  Service: Urology;  Laterality: N/A;  . CYSTOSCOPY W/ CYSTOGRAM/  TRANSRECTAL ULTRASOUND PROSTATE BX  03-22-2009  . ESOPHAGOGASTRODUODENOSCOPY N/A 03/22/2015   Procedure: ESOPHAGOGASTRODUODENOSCOPY (EGD) with dilation;  Surgeon: [name], MD;  Location: [facility];  Service: Endoscopy;  Laterality: N/A;  . excision of skin lesion    . LAPAROSCOPIC CHOLECYSTECTOMY  2013  . left elbow surgery      due to fracture   . NASAL SEPTUM SURGERY  2000  . OTHER SURGICAL HISTORY      Muscle & bone Graft/Polio  . polio surgeries      14 polio surgeries   . PROSTATE BIOPSY N/A 09/28/2014   Procedure: PROSTATE ULTRASOUND/BIOPSY;  Surgeon: [name], MD;  Location: [facility];  Service: Urology;  Laterality: N/A;  . PROSTATE BIOPSY N/A 10/25/2015   Procedure: PROSTATE BIOPSY AND ULTRASOUND;  Surgeon: [name], MD;  Location: [facility];  Service: Urology;  Laterality: N/A;  . SAVORY DILATION N/A 03/22/2015   Procedure: SAVORY DILATION;  Surgeon: [name], MD;  Location: [facility];  Service: Endoscopy;  Laterality: N/A;  . SHOULDER ARTHROSCOPY WITH OPEN ROTATOR CUFF REPAIR Bilateral 2013  &  1999   removal spurs and bursectomy  . TRANSURETHRAL INCISION OF BLADDER NECK N/A 10/25/2015   Procedure:  TRANSURETHRAL INCISION OF BLADDER NECK;  Surgeon: [name], MD;  Location: [facility];  Service: Urology;  Laterality: N/A;  . TRANSURETHRAL RESECTION OF PROSTATE N/A 09/28/2014   Procedure: TRANSURETHRAL RESECTION OF THE PROSTATE (TURP);  Surgeon: [name], MD;  Location: [facility];  Service: Urology;  Laterality: N/A;  . URETEROSOPY STONE EXTRACTION  2000       Home Medications    Prior to Admission medications   Medication Sig Start Date End Date Taking? Authorizing Provider  albuterol (PROVENTIL) (2.5 MG/3ML) 0.083% nebulizer solution Take 3 mLs (2.5 mg total) by  nebulization every 6 (six) hours as needed for wheezing or shortness of breath. 07/21/20   Valentina Shaggy, MD  albuterol (VENTOLIN HFA) 108 (90 Base) MCG/ACT inhaler Inhale 2 puffs into the lungs every 6 (six) hours as needed for wheezing or shortness of breath. 07/01/20   Valentina Shaggy, MD  arformoterol (BROVANA) 15 MCG/2ML NEBU Take 2 mLs (15 mcg total) by nebulization 2 (two) times daily. 07/01/20   Valentina Shaggy, MD  baclofen (LIORESAL) 10 MG tablet Take 10 mg by mouth daily. 08/25/19   [provider]  budesonide (PULMICORT) 0.5 MG/2ML nebulizer solution Take 2 mLs (0.5 mg total) by nebulization 2 (two) times daily. 05/06/20   Valentina Shaggy, MD  colchicine 0.6 MG tablet TK 2 TS PO INITIALLY THEN TK 1 T IN 1 HOUR 09/20/19   [provider]  diazepam (VALIUM) 10 MG tablet Take 10 mg by mouth daily.    [provider]  diphenhydrAMINE (SOMINEX) 25 MG tablet Take by mouth.    [provider]  EPINEPHrine (EPIPEN 2-PAK) 0.3 mg/0.3 mL IJ SOAJ injection Inject 0.3 mLs (0.3 mg total) into the muscle as needed for anaphylaxis. 07/03/19   Valentina Shaggy, MD  ipratropium-albuterol (DUONEB) 0.5-2.5 (3) MG/3ML SOLN Take 3 mLs by nebulization every evening. 01/21/20   Valentina Shaggy, MD  loratadine (CLARITIN) 10 MG tablet Take 1 tablet (10 mg total) by mouth daily as needed for allergies. 01/18/20   Valentina Shaggy, MD  nitroGLYCERIN (NITROSTAT) 0.4 MG SL tablet Place 1 tablet (0.4 mg total) under the tongue every 5 (five) minutes as needed for chest pain. 06/21/15   Brunetta Jeans, PA-C  ondansetron (ZOFRAN ODT) 4 MG disintegrating tablet Take 1 tablet (4 mg total) by mouth every 8 (eight) hours as needed for nausea or vomiting. 07/06/20   Valentina Shaggy, MD  OXYGEN Inhale 2-3 L into the lungs at bedtime. DURING THE DAY IF NEEDED Reported on 11/25/2015    [provider]  pantoprazole (PROTONIX) 40 MG tablet Take 1  tablet (40 mg total) by mouth daily. 01/18/20   Valentina Shaggy, MD  predniSONE (DELTASONE) 20 MG tablet Take 2 tablets x 3 days, then take 1 tablet x 3 days 07/27/20   Fenton Foy, NP  predniSONE (DELTASONE) 50 MG tablet Take 1 tablet (50 mg total) by mouth daily with breakfast. 08/02/20   Valentina Shaggy, MD  Respiratory Therapy Supplies (NEBULIZER/TUBING/MOUTHPIECE) KIT 1 each by Does  not apply route as directed. 07/24/19   Valentina Shaggy, MD    Family History Family History  Problem Relation Age of Onset  . Alzheimer's disease Father 4       Deceased  . Stomach cancer Father   . Heart attack Father   . Heart disease Father   . Skin cancer Mother        Facial-Living  . Alcohol abuse Sister        x2  . Mental illness Sister        x2  . Alcohol abuse Sister   . Diabetes Maternal Aunt        x2  . Thyroid disease Maternal Aunt        x4  . Diabetes Maternal Uncle   . Tuberculosis Paternal Grandfather   . Tuberculosis Paternal Grandmother   . Alzheimer's disease Paternal Aunt   . Alzheimer's disease Paternal Uncle   . Colon cancer Neg Hx   . Colon polyps Neg Hx   . Crohn's disease Neg Hx   . Ulcerative colitis Neg Hx   . Allergic rhinitis Neg Hx   . Angioedema Neg Hx   . Asthma Neg Hx   . Eczema Neg Hx   . Urticaria Neg Hx     Social History Social History   Tobacco Use  . Smoking status: Former Smoker    Years: 47.00    Types: Pipe    Quit date: 08/25/2013    Years since quitting: 6.9  . Smokeless tobacco: Former Systems developer    Types: Chew  . Tobacco comment: 5-6 times smoking a pipe  Vaping Use  . Vaping Use: Never used  Substance Use Topics  . Alcohol use: Yes    Alcohol/week: 14.0 standard drinks    Types: 14 Cans of beer per week    Comment: 2 cans daily  . Drug use: No     Allergies   Aspirin, Hornet venom, Ivp dye [iodinated diagnostic agents], Levaquin [levofloxacin in d5w], Nsaids, Penicillins, Tolmetin, Fasenra [benralizumab],  Perforomist [formoterol], Buprenorphine hcl, Morphine and related, and Oxycodone   Review of Systems Review of Systems  Constitutional: Negative for fatigue and fever.  Respiratory: Positive for cough, shortness of breath and wheezing.   Cardiovascular: Negative for chest pain and palpitations.  Gastrointestinal: Negative for abdominal pain, diarrhea and vomiting.  Musculoskeletal: Negative for arthralgias and myalgias.  Skin: Negative for rash and wound.  Neurological: Negative for speech difficulty and headaches.  All other systems reviewed and are negative.    Physical Exam Triage Vital Signs ED Triage Vitals  Enc Vitals Group     BP 08/16/20 1405 (!) 168/97     Pulse Rate 08/16/20 1405 88     Resp 08/16/20 1405 20     Temp 08/16/20 1405 98.7 F (37.1 C)     Temp Source 08/16/20 1405 Oral     SpO2 08/16/20 1405 96 %     Weight --      Height --      Head Circumference --      Peak Flow --      Pain Score 08/16/20 1406 0     Pain Loc --      Pain Edu? --      Excl. in [place]? --    No data found.  Updated Vital Signs BP (!) 168/97 (BP Location: Left Arm)   Pulse 88   Temp 98.7 F (37.1 C) (Oral)   Resp 20  SpO2 96%   Visual Acuity Right Eye Distance:   Left Eye Distance:   Bilateral Distance:    Right Eye Near:   Left Eye Near:    Bilateral Near:     Physical Exam Constitutional:      General: He is not in acute distress.    Appearance: He is not toxic-appearing or diaphoretic.  HENT:     Head: Normocephalic and atraumatic.     Mouth/Throat:     Mouth: Mucous membranes are moist.     Pharynx: Oropharynx is clear.  Eyes:     General: No scleral icterus.    Conjunctiva/sclera: Conjunctivae normal.     Pupils: Pupils are equal, round, and reactive to light.  Neck:     Comments: Trachea midline, negative JVD Cardiovascular:     Rate and Rhythm: Normal rate and regular rhythm.  Pulmonary:     Effort: Pulmonary effort is normal. No respiratory  distress.     Breath sounds: Rhonchi present. No wheezing.  Musculoskeletal:     Cervical back: Neck supple. No tenderness.  Lymphadenopathy:     Cervical: No cervical adenopathy.  Skin:    Capillary Refill: Capillary refill takes less than 2 seconds.     Coloration: Skin is not jaundiced or pale.     Findings: No rash.  Neurological:     Mental Status: He is alert and oriented to person, place, and time.      UC Treatments / Results  Labs (all labs ordered are listed, but only abnormal results are displayed) Labs Reviewed - No data to display  EKG   Radiology DG Chest 2 View  Result Date: 08/16/2020 CLINICAL DATA:  69 year old male with increase shortness of breath for 4 days. Status post COVID-19. Last month. EXAM: CHEST - 2 VIEW COMPARISON:  Chest radiographs 07/22/2020 and earlier. FINDINGS: Regressed but not resolved bilateral coarse pulmonary interstitial opacity. Bilateral peripheral areas of confluent opacity seen last month are resolved. No pneumothorax or pleural effusion. No areas of worsening ventilation. Mediastinal contours remain within normal limits. Visualized tracheal air column is within normal limits. Stable cholecystectomy clips. No acute osseous abnormality identified. Negative visible bowel gas pattern. IMPRESSION: 1. Regressed but not fully resolved bilateral pulmonary opacity since last month compatible with sequelae of COVID-19. 2. No new cardiopulmonary abnormality. Electronically Signed   By: Genevie Ann M.D.   On: 08/16/2020 14:49    Procedures Procedures (including critical care time)  Medications Ordered in UC Medications - No data to display  Initial Impression / Assessment and Plan / UC Course  I have reviewed the triage vital signs and the nursing notes.  Pertinent labs & imaging results that were available during my care of the patient were reviewed by me and considered in my medical decision making (see chart for details).     Patient s/p  COVID-19 infection, routinely followed by pulmonologist.  Per chart review, pulmonologist currently managing patient-appears to be considering prednisone taper.  X-ray obtained here given increased dyspnea: Regressed, but not fully resolved bilateral pulmonary opacity seen last month compatible with sequela of COVID-19.  No new cardiopulmonary abnormality.  No indication for antibiotics at this time.  Will defer to pulmonology, who is managing patient for further evaluation and management.  Return precautions discussed, pt verbalized understanding and is agreeable to plan. Final Clinical Impressions(s) / UC Diagnoses   Final diagnoses:  Dyspnea, unspecified type     Discharge Instructions     Follow up with Dr.  Gallagher about today's visit.    ED Prescriptions    None     PDMP not reviewed this encounter.   [place], [place], [place] 08/16/20 1654

## 2020-08-16 NOTE — Telephone Encounter (Signed)
Patient to be notified.

## 2020-08-16 NOTE — Telephone Encounter (Signed)
Please advise on instructions for prednisone burst.

## 2020-08-16 NOTE — Telephone Encounter (Signed)
We can send in a prednisone burst for him if he would like. Make sure he is using his DuoNeb nebulizers every 4 hours scheduled for 3-5 days. If he is having congestion, he can use some Mucinex.   [name], MD [facility] [place] [facility] [place]

## 2020-08-16 NOTE — Discharge Instructions (Signed)
Follow up with Dr. Ernst Bowler about today's visit.

## 2020-08-16 NOTE — Telephone Encounter (Signed)
Patient informed. Can someone send in his prednisone burst?  [facility]

## 2020-08-16 NOTE — ED Triage Notes (Signed)
Pt c/o increase SOB x4 days. States had covid PNA last month that got better after starting on more prednisone. States is on O2 at night and now having to use it during the day.

## 2020-08-17 MED ORDER — PREDNISONE 10 MG PO TABS: ORAL_TABLET | ORAL | 0 refills | Status: DC

## 2020-08-17 NOTE — Telephone Encounter (Signed)
Noted. Patient has been notified from previous messages.

## 2020-08-17 NOTE — Addendum Note (Signed)
Addended by: Valentina Shaggy on: 08/17/2020 05:01 PM   Modules accepted: Orders

## 2020-08-17 NOTE — Telephone Encounter (Signed)
Prednisone sent in. Can you contact the patient to let him know?   [name], MD [facility] [place] [facility] [place]

## 2020-08-18 NOTE — Telephone Encounter (Signed)
Patient called back and wanted to know why he was not prescribed antibiotics. Patient also did not know how to take the prednisone.  Please advise.

## 2020-08-18 NOTE — Telephone Encounter (Signed)
Called and spoke with patient and he stated that when he had the chest x ray done the PA told him that "there was pneumonia everywhere", but they did not treat it and wanted Dr. Ernst Bowler to treat it because he is the specialist. I advised that we did not send in antibiotics because his symptoms were more conveyed to Dr. Ernst Bowler as an asthma flare and not an infection. He has a chest x ray in his chart from 08/15/2020. Could you possibly review and advise since Dr. Ernst Bowler is out of office?

## 2020-08-19 NOTE — Telephone Encounter (Signed)
I talked with [name] on the phone yesterday.  We thoroughly reviewed his current symptoms and the x-ray report from the urgent care on 08/16/2020.  At the end of our telephone call he did not have any questions and agreed to proceed with the dose of prednisone that had been recommended by Dr. Ernst Bowler.  He will call the clinic with any worsening symptoms or if he develops a fever.

## 2020-08-24 ENCOUNTER — Ambulatory Visit: Payer: Medicare Other

## 2020-09-09 ENCOUNTER — Other Ambulatory Visit: Payer: Self-pay | Admitting: Allergy & Immunology

## 2020-09-20 ENCOUNTER — Encounter: Payer: Self-pay | Admitting: Allergy & Immunology

## 2020-09-20 ENCOUNTER — Ambulatory Visit: Payer: Medicare Other | Admitting: Allergy & Immunology

## 2020-09-20 ENCOUNTER — Other Ambulatory Visit: Payer: Self-pay

## 2020-09-20 VITALS — BP 138/78 | HR 88 | Resp 18 | Ht 71.0 in | Wt 212.0 lb

## 2020-09-20 DIAGNOSIS — J3089 Other allergic rhinitis: Secondary | ICD-10-CM

## 2020-09-20 DIAGNOSIS — T63481D Toxic effect of venom of other arthropod, accidental (unintentional), subsequent encounter: Secondary | ICD-10-CM

## 2020-09-20 DIAGNOSIS — K219 Gastro-esophageal reflux disease without esophagitis: Secondary | ICD-10-CM | POA: Diagnosis not present

## 2020-09-20 DIAGNOSIS — T63441D Toxic effect of venom of bees, accidental (unintentional), subsequent encounter: Secondary | ICD-10-CM | POA: Diagnosis not present

## 2020-09-20 DIAGNOSIS — Z7185 Encounter for immunization safety counseling: Secondary | ICD-10-CM

## 2020-09-20 DIAGNOSIS — J4550 Severe persistent asthma, uncomplicated: Secondary | ICD-10-CM

## 2020-09-20 DIAGNOSIS — J302 Other seasonal allergic rhinitis: Secondary | ICD-10-CM

## 2020-09-20 DIAGNOSIS — G4733 Obstructive sleep apnea (adult) (pediatric): Secondary | ICD-10-CM

## 2020-09-20 NOTE — Patient Instructions (Addendum)
1. Severe persistent asthma with COPD overlap - We are going to decrease the prednisone to 5mg  today. - If you are interested in [place], we will work on getting it approved again.  - We will refer you to Pulmonology. - Daily controller medication(s): Brovana 64mcg THREE TIMES DAILY + Yupelri ONE TIME DAILY + montelukast 10 mg ONE TIME DAILY + prednisone 5mg  ONCE DAILY - Prior to physical activity: ProAir 2 puffs 10-15 minutes before physical activity. - Rescue medications: albuterol nebulizer one vial every 4-6 hours as needed or DuoNeb nebulizer one vial every 4-6 hours as needed - Asthma control goals:  * Full participation in all desired activities (may need albuterol before activity) * Albuterol use two time or less a week on average (not counting use with activity) * Cough interfering with sleep two time or less a month * Oral steroids no more  than once a year * No hospitalizations  2. Perennial and seasonal allergic rhinitis - Continue with loratadine 10mg  daily as needed. - Continue with fluticasone 1-2 sprays per nostril daily as needed.   3. GERD - Continue with pantoprazole 40mg  daily.  - Monitor Tums usage.   4. Stinging insect anaphylaxis (honeybee, hornets, yellow jacket, wasp) - We can certainly consider venom immunotherapy, but at this point I think we should hold off.  - I would like to get your breathing under better control first.   5. Return in about 3 months (around 12/19/2020).    Please inform us of any Emergency Department visits, hospitalizations, or changes in symptoms. Call us before going to the ED for breathing or allergy symptoms since we might be able to fit you in for a sick visit. Feel free to contact us anytime with any questions, problems, or concerns.  It was a pleasure to talk to you today today!  Websites that have reliable patient information: 1. American Academy of Asthma, Allergy, and Immunology: www.aaaai.org 2. Food Allergy Research and  Education (FARE): foodallergy.org 3. Mothers of Asthmatics: http://www.asthmacommunitynetwork.org 4. American College of Allergy, Asthma, and Immunology: www.acaai.org   COVID-19 Vaccine Information can be found at: ShippingScam.co.uk For questions related to vaccine distribution or appointments, please email vaccine@ .com or call 6606274909.     "Like" [name] on Facebook and Instagram for our latest updates!        Make sure you are registered to vote! If you have moved or changed any of your contact information, you will need to get this updated before voting!  In some cases, you MAY be able to register to vote online: [email or web address]

## 2020-09-20 NOTE — Progress Notes (Signed)
FOLLOW UP  Date of Service/Encounter:  09/20/20   Assessment:   Severe persistent asthmawith COPD overlap  Systemic reaction to Fasenra  Systemic reaction versus panic attack to Nucala  Chronic prednisone use (5+ years) - currently on 10mg  daily(with intermittent self-induced bursts up to 20mg  daily)  Seasonal and perennial allergic rhinitis(grasses, weeds, trees, cat, dog, dust mite, and cockroach)  History of non-compliance  Pulmonary nodules (stable per last chest CT June 2019)  Complex medical history, including bipolar depression  GERD -on omeprazole with continued marked use of Tums (8-10 per day)  OSA - with history of non-compliance with CPAP   [name] presents for follow-up today.  He is doing remarkably well all things considered.  This is probably the best of ever seen him.  His spirometry appears largely stable.  He is using his medications on a routine basis.  For some reason, he is not using his Pulmicort, but is on prednisone 10 mg daily.  He has been on this dose for a while now.  He is requesting a referral to a pulmonologist, which I find frankly surprising since he has fired multiple pulmonologist in the past.  We will give it another try, however.  He continues to endorse interested in [place].  We will work on getting this approved once again on a steroid sparing indication.  I believe we have worked on getting this approved in the past, but he has never followed up for start injection appointments.  Plan/Recommendations:   1. Severe persistent asthma with COPD overlap - We are going to decrease the prednisone to 5mg  today. - If you are interested in [place], we will work on getting it approved again.  - We will refer you to Pulmonology. - Daily controller medication(s): Brovana 71mcg THREE TIMES DAILY + Yupelri ONE TIME DAILY + montelukast 10 mg ONE TIME DAILY + prednisone 5mg  ONCE DAILY - Prior to physical activity: ProAir 2 puffs  10-15 minutes before physical activity. - Rescue medications: albuterol nebulizer one vial every 4-6 hours as needed or DuoNeb nebulizer one vial every 4-6 hours as needed - Asthma control goals:  * Full participation in all desired activities (may need albuterol before activity) * Albuterol use two time or less a week on average (not counting use with activity) * Cough interfering with sleep two time or less a month * Oral steroids no more  than once a year * No hospitalizations  2. Perennial and seasonal allergic rhinitis - Continue with loratadine 10mg  daily as needed. - Continue with fluticasone 1-2 sprays per nostril daily as needed.   3. GERD - Continue with pantoprazole 40mg  daily.  - Monitor Tums usage.   4. Stinging insect anaphylaxis (honeybee, hornets, yellow jacket, wasp) - We can certainly consider venom immunotherapy, but at this point I think we should hold off.  - I would like to get your breathing under better control first.   5. Return in about 3 months (around 12/19/2020).    Subjective:   [name] is a 69 y.o. male presenting today for follow up of  Chief Complaint  Patient presents with  . Asthma    doing better since covid. he did have his johnson and johnson vaccine two weeks ago. he is currently on 2-3 lpm oxygen throughout the day as needed. not constant.     [name] has a history of the following: Patient Active Problem List   Diagnosis Date Noted  . Pneumonia due to  COVID-19 virus 07/27/2020  . COPD with acute exacerbation ([place]) 12/04/2018  . PSVT (paroxysmal supraventricular tachycardia) ([place]) 09/23/2018  . Seasonal and perennial allergic rhinitis 07/30/2018  . Asthma-COPD overlap syndrome ([place]) 07/07/2018  . S/P arthroscopy of right shoulder 04/25/2017  . Generalized anxiety disorder 04/22/2017  . Severe persistent asthma without complication 87/56/4332  . Bee sting-induced anaphylaxis 02/12/2017  . Anaphylactic reaction due to  food, initial encounter 02/12/2017  . Current use of beta blocker 02/12/2017  . Allergic rhinitis 01/15/2017  . COPD exacerbation ([place]) 11/04/2016  . Asthma exacerbation 11/03/2016  . Nausea without vomiting 09/17/2016  . Shortness of breath   . Diabetes mellitus type 2 in obese ([place]) 06/26/2016  . non-specific Chest pain 06/12/2016  . Patient's noncompliance with other medical treatment and regimen 06/02/2016  . Hyperlipidemia, mixed 06/02/2016  . Elevated liver enzymes  06/02/2016  . Schizophrenia ([place]) 04/30/2016  . Environmental and seasonal allergies 04/30/2016  . Diabetes mellitus without complication- (diet controlled; w/o proteinuria) 04/30/2016  . Heavy alcohol use- Many yrs  04/30/2016  . Basal cell carcinoma of back 04/30/2016  . Obesity 04/30/2016  . Hx of multiple concussions 04/30/2016  . h/o Vitamin D deficiency 04/30/2016  . Screen for sexually transmitted diseases 04/30/2016  . Immunocompromised state ([place]) 04/30/2016  . Cervical radiculopathy 01/15/2016  . Alcohol withdrawal ([place]) 11/22/2015  . Alcohol abuse with alcohol-induced mood disorder ([place]) 11/22/2015  . h/o Suicidal thoughts 11/21/2015  . Dry mouth 10/07/2015  . Anxiety 09/19/2015  . OAB (overactive bladder) 07/27/2015  . Chronic idiopathic constipation 05/08/2015  . Screening for colon cancer   . Benign neoplasm of cecum   . Benign neoplasm of ascending colon   . Benign neoplasm of transverse colon   . Benign neoplasm of sigmoid colon   . Rectal polyp   . Gastroesophageal reflux disease without esophagitis   . Esophageal stricture   . Joint stiffness of multiple sites 10/17/2014  . Benign localized hyperplasia of prostate with urinary obstruction 09/29/2014  . concerns for memory loss 09/13/2014  . Tuberculosis   . Diverticulum of bladder 06/17/2014  . Acute urinary retention 05/31/2014  . Neuropathy ([place]) 04/11/2014  . Post-polio syndrome 01/13/2014  . Other emphysema ([place]) 01/13/2014  .  Hypertension 01/13/2014  . Former smoker 01/13/2014  . h/o Prostate cancer 01/13/2014  . History of tuberculosis 01/13/2014  . Bipolar disorder ([place]) 01/13/2014  . Explosive personality disorder ([place]) 01/13/2014  . Erectile dysfunction 01/13/2014  . Nephrolithiasis 01/13/2014  . Chronic pain 01/13/2014  . Obstructive sleep apnea 01/13/2014  . Alcoholic gastritis 95/18/8416  . Nocturnal hypoxia 08/10/2013    History obtained from: chart review and patient.  [name] is a 69 y.o. male presenting for a follow up visit.  He is well-known to this practice and has a history of severe asthma with COPD overlap as well as noncompliance with medications.  He was last seen via televisit in July 2021.  At that time, he did not make any changes.  We continued him on Pulmicort twice daily, Brovana twice daily, Yupelri daily, montelukast 10 mg daily, and prednisone 5 mg 2 times daily.  For his rhinitis, we continued with Claritin as well as Flonase.  We also continue pantoprazole 40 mg daily.  He was using a lot of Tums, so we asked him to track his usage to see if we need to add on an H2 blocker.  He has a history of stinging insect anaphylaxis.  We did discuss venom immunotherapy, but  I prefer to get his breathing under better control first.   Since last visit, he has done well.  He actually looks quite good today.  He was diagnosed with COVID-19 in September 2021.  He received infusions in the emergency room.  His dyspnea has become more frequent since getting COVID-19. He was in the urgent care in October 2021 and diagnosed with multifocal pneumonia. He was given azithromycin as well as prednisone.  He was continued on 2 L of O2 via nasal cannula.  He continues to decline the COVID-19 vaccine.  Asthma/Respiratory Symptom History: He tells me he is using his Brovana twice daily as well as the Yupelri once daily.  For some reason budesonide is not on his list, but he remains on prednisone 10 mg daily.  He  has oxygen at home, but is not using it right now.  He has a history of obstructive sleep apnea but his CPAP was taken away because he was not using it.  ACT score is 13, indicating subpar asthma control.  He remains interested in [place].  He would like to get off prednisone or at least take something else to help with his breathing.   Allergic Rhinitis Symptom History: He remains on Claritin and Flonase.  He has not needed any antibiotics at all.  Otherwise, there have been no changes to his past medical history, surgical history, family history, or social history.    Review of Systems  Constitutional: Negative.  Negative for chills, fever, malaise/fatigue and weight loss.  HENT: Positive for congestion. Negative for ear discharge, ear pain and sinus pain.   Eyes: Negative for pain, discharge and redness.  Respiratory: Positive for cough and shortness of breath. Negative for sputum production and wheezing.   Cardiovascular: Negative.  Negative for chest pain and palpitations.  Gastrointestinal: Negative for abdominal pain, constipation, diarrhea, heartburn, nausea and vomiting.  Skin: Negative.  Negative for itching and rash.  Neurological: Negative for dizziness and headaches.  Endo/Heme/Allergies: Positive for environmental allergies. Does not bruise/bleed easily.       Objective:   Blood pressure 138/78, pulse 88, resp. rate 18, height 5\' 11"  (1.803 m), weight 212 lb (96.2 kg), SpO2 98 %. Body mass index is 29.57 kg/m.   Physical Exam:  Physical Exam Constitutional:      Appearance: He is well-developed.     Comments: Still somewhat crotchety, but in a teasing mood.  HENT:     Head: Normocephalic and atraumatic.     Right Ear: Tympanic membrane, ear canal and external ear normal.     Left Ear: Tympanic membrane, ear canal and external ear normal.     Nose: No nasal deformity, septal deviation, mucosal edema, rhinorrhea or epistaxis.     Right Turbinates: Enlarged and  swollen.     Left Turbinates: Enlarged and swollen.     Right Sinus: No maxillary sinus tenderness or frontal sinus tenderness.     Left Sinus: No maxillary sinus tenderness or frontal sinus tenderness.     Mouth/Throat:     Mouth: Oropharynx is clear and moist. Mucous membranes are not pale and not dry.     Pharynx: Uvula midline.  Eyes:     General:        Right eye: No discharge.        Left eye: No discharge.     Extraocular Movements: EOM normal.     Conjunctiva/sclera: Conjunctivae normal.     Right eye: Right conjunctiva is not injected. No  chemosis.    Left eye: Left conjunctiva is not injected. No chemosis.    Pupils: Pupils are equal, round, and reactive to light.  Cardiovascular:     Rate and Rhythm: Normal rate and regular rhythm.     Heart sounds: Normal heart sounds.  Pulmonary:     Effort: Pulmonary effort is normal. No tachypnea, accessory muscle usage or respiratory distress.     Breath sounds: Normal breath sounds. No wheezing, rhonchi or rales.     Comments: Moving air well in all lung fields.  No increased work of breathing. Chest:     Chest wall: No tenderness.  Lymphadenopathy:     Cervical: No cervical adenopathy.  Skin:    Coloration: Skin is not pale.     Findings: No abrasion, erythema, petechiae or rash. Rash is not papular, urticarial or vesicular.     Comments: No eczematous or urticarial lesions noted.  Neurological:     Mental Status: He is alert.  Psychiatric:        Mood and Affect: Mood and affect normal.        Behavior: Behavior is cooperative.      Diagnostic studies:    Spirometry: results abnormal (FEV1: 1.55/46%, FVC: 2.05/45%, FEV1/FVC: 76%).    Spirometry consistent with possible restrictive disease.   Allergy Studies: none      [name], MD  [facility]

## 2020-09-22 ENCOUNTER — Encounter: Payer: Self-pay | Admitting: Allergy & Immunology

## 2020-09-26 NOTE — Progress Notes (Signed)
Hey,   Lets get together tomorrow when you are in office to figure out the best Pulmonologist for the patient. I also have to make sure they take his insurance because his insurance is a little funky.   Thanks

## 2020-09-27 ENCOUNTER — Telehealth: Payer: Self-pay

## 2020-09-27 NOTE — Telephone Encounter (Signed)
Per Dr Ernst Bowler: Please refer [name] for sleep apnea and asthma COPD overlap to pulmonary.  Patient is scheduled for 11/15/20 with Dr Ander Slade @ 73.  [facility] [address] [place], [place] [address] [phone]  I called and left a detailed voicemail for the patient to call back.  Thanks

## 2020-09-27 NOTE — Progress Notes (Signed)
Before his auth expired I advised him to reorder same and he never did.  Since we have no idea that he will restart or continue can we just try him on another sample.

## 2020-10-03 ENCOUNTER — Telehealth: Payer: Self-pay | Admitting: *Deleted

## 2020-10-03 NOTE — Telephone Encounter (Signed)
L/m for patient to contact me regarding Dupxient. Per Dr Ernst Bowler we will try sample again instead getting auth and resubmit.  I had already advised patient months ago before auth run out he only had to pharmacy to reorder

## 2020-10-05 ENCOUNTER — Telehealth: Payer: Self-pay | Admitting: *Deleted

## 2020-10-05 NOTE — Telephone Encounter (Signed)
Patient called back and is ok with coming into office for sample.  He also advised last time that MD gave him something to take prior to his last injections to help with possible reactions. Please advise

## 2020-10-05 NOTE — Telephone Encounter (Signed)
I would recommend that he double up his prednisone to twice daily for four days before and four days after. I also would make sure that he is using a second generation antihistamine twice daily for four days before and four days after the injection. Options include Zyrtec, Xyzal, or Allegra.   [name], MD [facility] [place]

## 2020-10-05 NOTE — Telephone Encounter (Signed)
error 

## 2020-10-06 ENCOUNTER — Other Ambulatory Visit: Payer: Self-pay | Admitting: *Deleted

## 2020-10-06 MED ORDER — CETIRIZINE HCL 10 MG PO TABS: 10.0000 mg | ORAL_TABLET | Freq: Two times a day (BID) | ORAL | 2 refills | Status: DC

## 2020-10-06 MED ORDER — PREDNISONE 10 MG PO TABS: 10.0000 mg | ORAL_TABLET | Freq: Two times a day (BID) | ORAL | 0 refills | Status: DC

## 2020-10-06 MED ORDER — PREDNISONE 10 MG PO TABS: ORAL_TABLET | ORAL | 0 refills | Status: DC

## 2020-10-06 NOTE — Telephone Encounter (Signed)
Spoke with patient, informed him of Dr. Gillermina Hu recommendation. Patient stated he will need a prescription for Zyrtec and Prednisone. Prescription has been sent in. Patient informed me that he has been having to increase his prednisone and feels better on the 10 mg tablets. He stated that it helps him work better and feel better during the day. Patient stated that he would like 10 mg tablets sent in.

## 2020-10-06 NOTE — Telephone Encounter (Signed)
Great.  Thanks for taking care of that.  [name], MD [facility] [place]

## 2020-10-06 NOTE — Addendum Note (Signed)
Addended by: Herbie Drape on: 10/06/2020 03:13 PM   Modules accepted: Orders

## 2020-10-09 ENCOUNTER — Encounter (HOSPITAL_COMMUNITY): Payer: Self-pay

## 2020-10-09 ENCOUNTER — Other Ambulatory Visit: Payer: Self-pay

## 2020-10-09 ENCOUNTER — Emergency Department (HOSPITAL_COMMUNITY)
Admission: EM | Admit: 2020-10-09 | Discharge: 2020-10-09 | Disposition: A | Discharge status: Home / Self Care | Payer: Medicare Other | Attending: Emergency Medicine | Admitting: Emergency Medicine

## 2020-10-09 ENCOUNTER — Emergency Department (HOSPITAL_COMMUNITY): Payer: Medicare Other

## 2020-10-09 DIAGNOSIS — K644 Residual hemorrhoidal skin tags: Secondary | ICD-10-CM | POA: Insufficient documentation

## 2020-10-09 DIAGNOSIS — Z87891 Personal history of nicotine dependence: Secondary | ICD-10-CM | POA: Insufficient documentation

## 2020-10-09 DIAGNOSIS — I10 Essential (primary) hypertension: Secondary | ICD-10-CM | POA: Insufficient documentation

## 2020-10-09 DIAGNOSIS — Z79899 Other long term (current) drug therapy: Secondary | ICD-10-CM | POA: Insufficient documentation

## 2020-10-09 DIAGNOSIS — E119 Type 2 diabetes mellitus without complications: Secondary | ICD-10-CM | POA: Insufficient documentation

## 2020-10-09 DIAGNOSIS — K5909 Other constipation: Secondary | ICD-10-CM

## 2020-10-09 DIAGNOSIS — J449 Chronic obstructive pulmonary disease, unspecified: Secondary | ICD-10-CM | POA: Insufficient documentation

## 2020-10-09 DIAGNOSIS — K5900 Constipation, unspecified: Secondary | ICD-10-CM | POA: Diagnosis present

## 2020-10-09 DIAGNOSIS — Z8546 Personal history of malignant neoplasm of prostate: Secondary | ICD-10-CM | POA: Insufficient documentation

## 2020-10-09 DIAGNOSIS — K5641 Fecal impaction: Secondary | ICD-10-CM | POA: Diagnosis not present

## 2020-10-09 LAB — CBC WITH DIFFERENTIAL/PLATELET
Abs Immature Granulocytes: 0.05 10*3/uL (ref 0.00–0.07)
Basophils Absolute: 0.1 10*3/uL (ref 0.0–0.1)
Basophils Relative: 1 %
Eosinophils Absolute: 0.3 10*3/uL (ref 0.0–0.5)
Eosinophils Relative: 3 %
HCT: 43.9 % (ref 39.0–52.0)
Hemoglobin: 14.6 g/dL (ref 13.0–17.0)
Immature Granulocytes: 1 %
Lymphocytes Relative: 16 %
Lymphs Abs: 1.4 10*3/uL (ref 0.7–4.0)
MCH: 33.5 pg (ref 26.0–34.0)
MCHC: 33.3 g/dL (ref 30.0–36.0)
MCV: 100.7 fL — ABNORMAL HIGH (ref 80.0–100.0)
Monocytes Absolute: 0.6 10*3/uL (ref 0.1–1.0)
Monocytes Relative: 7 %
Neutro Abs: 6.4 10*3/uL (ref 1.7–7.7)
Neutrophils Relative %: 72 %
Platelets: 252 10*3/uL (ref 150–400)
RBC: 4.36 MIL/uL (ref 4.22–5.81)
RDW: 14 % (ref 11.5–15.5)
WBC: 8.8 10*3/uL (ref 4.0–10.5)
nRBC: 0 % (ref 0.0–0.2)

## 2020-10-09 LAB — BASIC METABOLIC PANEL
Anion gap: 10 (ref 5–15)
BUN: 11 mg/dL (ref 8–23)
CO2: 23 mmol/L (ref 22–32)
Calcium: 9.5 mg/dL (ref 8.9–10.3)
Chloride: 104 mmol/L (ref 98–111)
Creatinine, Ser: 1.12 mg/dL (ref 0.61–1.24)
GFR, Estimated: >60 mL/min (ref >60)
Glucose, Bld: 131 mg/dL — ABNORMAL HIGH (ref 70–99)
Potassium: 4.2 mmol/L (ref 3.5–5.1)
Sodium: 137 mmol/L (ref 135–145)

## 2020-10-09 MED ORDER — LIDOCAINE 5 % EX CREA: TOPICAL_CREAM | CUTANEOUS | 0 refills | Status: DC

## 2020-10-09 NOTE — ED Triage Notes (Signed)
Patient arrived with complaints of constipation and nausea, reports taking an enema with no relief over the last few weeks.

## 2020-10-09 NOTE — ED Provider Notes (Signed)
[facility] DEPT Provider Note: Georgena Spurling, MD, FACEP  CSN: [id number] MRN: [id number] ARRIVAL: 10/09/20 at [place]: [place]  Constipation   HISTORY OF PRESENT ILLNESS  10/09/20 4:41 AM [name] is a 68 y.o. male with history of prostate cancer for which he has not been seen in 2 years (he was told by his urologist that he would likely die of COPD first).  For the past 6 months he has had chronic constipation.  He is relied on daily enemas to pass what he describes as brick-like stool.  He did not have an enema at home this morning so used an over-the-counter Fleets suppository.  This caused cramping but he was unable to pass any stool.  He is having pain in his rectal area which he rates as an 8 out of 10, aching in nature.  It is worse with bowel movements or attempted bowel movements.  He is also having lesser pain in his abdomen which is fairly chronic.  He also has a history of back pain but is not on any prescription analgesics.   Past Medical History:  Diagnosis Date  . Anxiety disorder   . Arthritis    BILATERAL SHOULDERS, ELBOWS AND HANDS AND LEFT HIP AND KNEES--HX CORTISONE SHOTS IN SHOULDERS, ELBOWS, HIP AND KNEES  . Basal cell carcinoma of back 04/30/2016   Dermatologist- Dr Nevada Crane;   MOHS sx- Dr Levada Dy   . Bladder outlet obstruction   . Chronic idiopathic constipation 05/08/2015  . Complication of anesthesia    DIFFICULT WAKING   . COPD (chronic obstructive pulmonary disease) Gold C Frequent exacerbations 01/13/2014   [name] 07/08/14: FeV1 51% FeV1/FVC 66% FVC 59% 10/5/2015ONO RA was normal  10/13/2014  ONO on RA NORMAL   . COPD, frequent exacerbations ([place])    pulmologist-  dr Joya Gaskins--  [place] Rm Stage C.04-25-15 recent COPD exacerbation-much improved now, after tx. in ER [facility].  Marland Kitchen Depression   . Diabetes mellitus without complication ([place])    BODERLINE - DIET CONTROL  . Dysrhythmia    PVC'S  . Emphysema lung ([place])    stage 2  .  Family history of adverse reaction to anesthesia    father would wake up with agitation   . Former smoker 01/13/2014  . Gastroesophageal reflux disease without esophagitis   . Heavy alcohol use 04/30/2016  . History of chronic bronchitis   . History of oxygen administration    oxygen use 2 l/m nasally at bedtime and exertional occasions  . History of rheumatic fever   . History of TB (tuberculosis)    1984--  hospitalized for 4 month treatment  . History of urinary retention   . Hx of multiple concussions    x 2 per patient   . Hypertension   . Melanoma ([place])   . Nocturnal oxygen desaturation    USES O2 NIGHTLY  . OSA (obstructive sleep apnea) 01/13/2014  . PONV (postoperative nausea and vomiting)   . Post-polio syndrome    polio at age 79--PT WAS IN IRON LUNG; PT WAS IN W/C UNTIL AGE 54; STILL HAS WEAKNESS RIGHT SIDE  . Prostate cancer ([place])   . Schizophrenia ([place])   . Tuberculosis    Hosp 4 months rx , left early   . Urticaria     Past Surgical History:  Procedure Laterality Date  . CARDIOVASCULAR STRESS TEST  06-08-2014  dr Mare Ferrari   normal lexiscan study/  no ischemia/  not gated due  to PAC's  . COLONOSCOPY N/A 05/03/2015   Procedure: COLONOSCOPY;  Surgeon: [name], MD;  Location: [facility];  Service: Endoscopy;  Laterality: N/A;  . CYSTOSCOPY N/A 10/25/2015   Procedure: CYSTOSCOPY;  Surgeon: [name], MD;  Location: [facility];  Service: Urology;  Laterality: N/A;  . CYSTOSCOPY W/ CYSTOGRAM/  TRANSRECTAL ULTRASOUND PROSTATE BX  03-22-2009  . ESOPHAGOGASTRODUODENOSCOPY N/A 03/22/2015   Procedure: ESOPHAGOGASTRODUODENOSCOPY (EGD) with dilation;  Surgeon: [name], MD;  Location[facility];  Service: Endoscopy;  Laterality: N/A;  . excision of skin lesion    . LAPAROSCOPIC CHOLECYSTECTOMY  2013  . left elbow surgery      due to fracture   . NASAL SEPTUM SURGERY  2000  . OTHER SURGICAL HISTORY      Muscle & bone Graft/Polio  . polio surgeries      14 polio  surgeries   . PROSTATE BIOPSY N/A 09/28/2014   Procedure: PROSTATE ULTRASOUND/BIOPSY;  Surgeon: [name], MD;  Location[facility];  Service: Urology;  Laterality: N/A;  . PROSTATE BIOPSY N/A 10/25/2015   Procedure: PROSTATE BIOPSY AND ULTRASOUND;  Surgeon: [name], MD;  Location: [facility];  Service: Urology;  Laterality: N/A;  . SAVORY DILATION N/A 03/22/2015   Procedure: SAVORY DILATION;  Surgeon: [name], MD;  [facility];  Service: Endoscopy;  Laterality: N/A;  . SHOULDER ARTHROSCOPY WITH OPEN ROTATOR CUFF REPAIR Bilateral 2013  &  1999   removal spurs and bursectomy  . TRANSURETHRAL INCISION OF BLADDER NECK N/A 10/25/2015   Procedure:  TRANSURETHRAL INCISION OF BLADDER NECK;  Surgeon: [name], MD;  [facility];  Service: Urology;  Laterality: N/A;  . TRANSURETHRAL RESECTION OF PROSTATE N/A 09/28/2014   Procedure: TRANSURETHRAL RESECTION OF THE PROSTATE (TURP);  Surgeon: [name], MD;  Location[facility];  Service: Urology;  Laterality: N/A;  . URETEROSOPY STONE EXTRACTION  2000    Family History  Problem Relation Age of Onset  . Alzheimer's disease Father 4       Deceased  . Stomach cancer Father   . Heart attack Father   . Heart disease Father   . Skin cancer Mother        Facial-Living  . Alcohol abuse Sister        x2  . Mental illness Sister        x2  . Alcohol abuse Sister   . Diabetes Maternal Aunt        x2  . Thyroid disease Maternal Aunt        x4  . Diabetes Maternal Uncle   . Tuberculosis Paternal Grandfather   . Tuberculosis Paternal Grandmother   . Alzheimer's disease Paternal Aunt   . Alzheimer's disease Paternal Uncle   . Colon cancer Neg Hx   . Colon polyps Neg Hx   . Crohn's disease Neg Hx   . Ulcerative colitis Neg Hx   . Allergic rhinitis Neg Hx   . Angioedema Neg Hx   . Asthma Neg Hx   . Eczema Neg Hx   . Urticaria Neg Hx     Social History   Tobacco Use  . Smoking status: Former Smoker    Years: 47.00    Types:  Pipe    Quit date: 08/25/2013    Years since quitting: 7.1  . Smokeless tobacco: Former Systems developer    Types: Chew  . Tobacco comment: 5-6 times smoking a pipe  Vaping Use  . Vaping  Use: Never used  Substance Use Topics  . Alcohol use: Yes    Alcohol/week: 14.0 standard drinks    Types: 14 Cans of beer per week    Comment: 2 cans daily  . Drug use: No    Prior to Admission medications   Medication Sig Start Date End Date Taking? Authorizing Provider  albuterol (PROVENTIL) (2.5 MG/3ML) 0.083% nebulizer solution Take 3 mLs (2.5 mg total) by nebulization every 6 (six) hours as needed for wheezing or shortness of breath. 07/21/20   Valentina Shaggy, MD  albuterol (VENTOLIN HFA) 108 (90 Base) MCG/ACT inhaler Inhale 2 puffs into the lungs every 6 (six) hours as needed for wheezing or shortness of breath. 07/01/20   Valentina Shaggy, MD  arformoterol (BROVANA) 15 MCG/2ML NEBU Take 2 mLs (15 mcg total) by nebulization 2 (two) times daily. 07/01/20   Valentina Shaggy, MD  baclofen (LIORESAL) 10 MG tablet Take 10 mg by mouth daily. 08/25/19   [provider]  budesonide (PULMICORT) 0.5 MG/2ML nebulizer solution Take 2 mLs (0.5 mg total) by nebulization 2 (two) times daily. 05/06/20   Valentina Shaggy, MD  cetirizine (ZYRTEC) 10 MG tablet Take 1 tablet (10 mg total) by mouth 2 (two) times daily. 10/06/20   Valentina Shaggy, MD  colchicine 0.6 MG tablet TK 2 TS PO INITIALLY THEN TK 1 T IN 1 HOUR 09/20/19   [provider]  diphenhydrAMINE (SOMINEX) 25 MG tablet Take by mouth.    [provider]  EPINEPHrine (EPIPEN 2-PAK) 0.3 mg/0.3 mL IJ SOAJ injection Inject 0.3 mLs (0.3 mg total) into the muscle as needed for anaphylaxis. 07/03/19   Valentina Shaggy, MD  ipratropium-albuterol (DUONEB) 0.5-2.5 (3) MG/3ML SOLN Take 3 mLs by nebulization every evening. 01/21/20   Valentina Shaggy, MD  Lidocaine 5 % CREA Apply to perianal area as needed for pain. 10/09/20    Alsha Meland, MD  loratadine (CLARITIN) 10 MG tablet Take 1 tablet (10 mg total) by mouth daily as needed for allergies. 01/18/20   Valentina Shaggy, MD  nitroGLYCERIN (NITROSTAT) 0.4 MG SL tablet Place 1 tablet (0.4 mg total) under the tongue every 5 (five) minutes as needed for chest pain. 06/21/15   Brunetta Jeans, PA-C  ondansetron (ZOFRAN ODT) 4 MG disintegrating tablet Take 1 tablet (4 mg total) by mouth every 8 (eight) hours as needed for nausea or vomiting. 07/06/20   Valentina Shaggy, MD  OXYGEN Inhale 2-3 L into the lungs at bedtime. DURING THE DAY IF NEEDED Reported on 11/25/2015    [provider]  predniSONE (DELTASONE) 10 MG tablet Take 1 tablet twice a day for four days before your injection, Then take 1 tablet twice a day for four days after your injection, then stop. 10/06/20   Valentina Shaggy, MD  Respiratory Therapy Supplies (NEBULIZER/TUBING/MOUTHPIECE) KIT 1 each by Does not apply route as directed. 07/24/19   Valentina Shaggy, MD    Allergies Aspirin, Hornet venom, Ivp dye [iodinated diagnostic agents], Levaquin [levofloxacin in d5w], Nsaids, Penicillins, Tolmetin, Fasenra [benralizumab], Perforomist [formoterol], Buprenorphine hcl, Morphine and related, and Oxycodone   REVIEW OF SYSTEMS  Negative except as noted here or in the History of Present Illness.   PHYSICAL EXAMINATION  Initial Vital Signs Blood pressure (!) 145/83, pulse 69, temperature 98.1 F (36.7 C), temperature source Oral, resp. rate 16, height _0  (1.803 m), weight 97.5 kg, SpO2 99 %.  Examination General: Well-developed, well-nourished male in no  acute distress; appearance consistent with age of record HENT: normocephalic; atraumatic Eyes: pupils equal, round and reactive to light; extraocular muscles intact Neck: supple Heart: regular rate and rhythm Lungs: clear to auscultation bilaterally Abdomen: soft; nondistended; nontender; bowel sounds present Rectal:  Tender, nonthrombosed hemorrhoids; fecal impaction Extremities: No deformity; full range of motion Neurologic: Awake, alert and oriented; motor function intact in all extremities and symmetric; no facial droop Skin: Warm and dry; multiple seborrheic keratoses and skin tags Psychiatric: Normal mood and affect   RESULTS  Summary of this visit's results, reviewed and interpreted by myself:   EKG Interpretation  Date/Time:    Ventricular Rate:    PR Interval:    QRS Duration:   QT Interval:    QTC Calculation:   R Axis:     Text Interpretation:        Laboratory Studies: Results for orders placed or performed during the hospital encounter of 10/09/20 (from the past 24 hour(s))  CBC with Differential/Platelet     Status: Abnormal   Collection Time: 10/09/20  5:01 AM  Result Value Ref Range   WBC 8.8 4.0 - 10.5 K/uL   RBC 4.36 4.22 - 5.81 MIL/uL   Hemoglobin 14.6 13.0 - 17.0 g/dL   HCT 43.9 39.0 - 52.0 %   MCV 100.7 (H) 80.0 - 100.0 fL   MCH 33.5 26.0 - 34.0 pg   MCHC 33.3 30.0 - 36.0 g/dL   RDW 14.0 11.5 - 15.5 %   Platelets 252 150 - 400 K/uL   nRBC 0.0 0.0 - 0.2 %   Neutrophils Relative % 72 %   Neutro Abs 6.4 1.7 - 7.7 K/uL   Lymphocytes Relative 16 %   Lymphs Abs 1.4 0.7 - 4.0 K/uL   Monocytes Relative 7 %   Monocytes Absolute 0.6 0.1 - 1.0 K/uL   Eosinophils Relative 3 %   Eosinophils Absolute 0.3 0.0 - 0.5 K/uL   Basophils Relative 1 %   Basophils Absolute 0.1 0.0 - 0.1 K/uL   Immature Granulocytes 1 %   Abs Immature Granulocytes 0.05 0.00 - 0.07 K/uL  Basic metabolic panel     Status: Abnormal   Collection Time: 10/09/20  5:01 AM  Result Value Ref Range   Sodium 137 135 - 145 mmol/L   Potassium 4.2 3.5 - 5.1 mmol/L   Chloride 104 98 - 111 mmol/L   CO2 23 22 - 32 mmol/L   Glucose, Bld 131 (H) 70 - 99 mg/dL   BUN 11 8 - 23 mg/dL   Creatinine, Ser 1.12 0.61 - 1.24 mg/dL   Calcium 9.5 8.9 - 10.3 mg/dL   GFR, Estimated >60 >60 mL/min   Anion gap 10 5 - 15    Imaging Studies: CT ABDOMEN PELVIS WO CONTRAST  Result Date: 10/09/2020 CLINICAL DATA:  Constipation with nausea. EXAM: CT ABDOMEN AND PELVIS WITHOUT CONTRAST TECHNIQUE: Multidetector CT imaging of the abdomen and pelvis was performed following the standard protocol without IV contrast. COMPARISON:  CT stone study 01/04/2017 FINDINGS: Lower chest: Areas of subpleural chronic interstitial change are noted in both lung bases, likely sequelae of prior infection/inflammation. Hepatobiliary: No focal abnormality in the liver on this study without intravenous contrast. The liver shows diffusely decreased attenuation suggesting fat deposition. Gallbladder is surgically absent. No intrahepatic or extrahepatic biliary dilation. Pancreas: No focal mass lesion. No dilatation of the main duct. No intraparenchymal cyst. No peripancreatic edema. Spleen: No splenomegaly. No focal mass lesion. Adrenals/Urinary Tract: No adrenal nodule or  mass. Kidneys unremarkable. No evidence for hydroureter. Focal dilatation distal left ureter just proximal to the UVJ is stable in the interval. The urinary bladder appears normal for the degree of distention. Stomach/Bowel: Stomach is unremarkable. No gastric wall thickening. No evidence of outlet obstruction. Duodenum is normally positioned as is the ligament of Treitz. No small bowel wall thickening. No small bowel dilatation. The terminal ileum is normal. The appendix is normal. No gross colonic mass. No colonic wall thickening. Vascular/Lymphatic: No objects crash that There is abdominal aortic atherosclerosis without aneurysm. There is no gastrohepatic or hepatoduodenal ligament lymphadenopathy. No retroperitoneal or mesenteric lymphadenopathy. No pelvic sidewall lymphadenopathy. Reproductive: The prostate gland and seminal vesicles are unremarkable. Other: No intraperitoneal free fluid. Musculoskeletal: No worrisome lytic or sclerotic osseous abnormality. Small left groin hernia  contains only fat. IMPRESSION: 1. No acute findings in the abdomen or pelvis. 2. Colon is largely free of stool with only minimal stool volume in the sigmoid colon and rectum. 3. Hepatic steatosis. 4. Aortic Atherosclerosis (ICD10-I70.0). Electronically Signed   By: Misty Stanley M.D.   On: 10/09/2020 06:37    ED COURSE and MDM  Nursing notes, initial and subsequent vitals signs, including pulse oximetry, reviewed and interpreted by myself.  Vitals:   10/09/20 0400 10/09/20 0415 10/09/20 0500 10/09/20 0619  BP: 123/84 (!) 145/83 (!) 168/72 (!) 128/110  Pulse: 72 69 83 90  Resp: _0 Temp:      TempSrc:      SpO2: 97% 99% 99% 100%  Weight:      Height:       Medications - No data to display  6:44 AM Patient given a soapsuds enema which allowed him to pass a large amount of hard stool just before his CT scan.  CT scan shows: Largely free of stool which is consistent with a post deprecatory state.  He would like a referral to gastroenterology.  PROCEDURES  Procedures   ED DIAGNOSES     ICD-10-CM   1. Fecal impaction in rectum (HCC)  K56.41   2. Chronic constipation  K59.09   3. External hemorrhoid  K64.4        Shani Fitch, Jenny Reichmann, MD 10/09/20 [phone]

## 2020-10-12 NOTE — Telephone Encounter (Signed)
Patient called and states he will never do another sleep study. Patient states he has done 2 before and he will never use a CPAP machine, never. Patient does want a Pulmonologist, but not a sleep study.

## 2020-10-12 NOTE — Telephone Encounter (Signed)
I never heard back from the patient. I am going to mail him a appointment reminder.   Thanks

## 2020-10-19 ENCOUNTER — Ambulatory Visit: Payer: Medicare Other

## 2020-10-21 NOTE — Telephone Encounter (Signed)
Patient informed to contact insurance company regarding additional options for pulmonologist.

## 2020-10-27 ENCOUNTER — Ambulatory Visit: Payer: Medicare Other

## 2020-11-03 ENCOUNTER — Telehealth: Payer: Self-pay | Admitting: Allergy & Immunology

## 2020-11-03 MED ORDER — ARFORMOTEROL TARTRATE 15 MCG/2ML IN NEBU: 15.0000 ug | INHALATION_SOLUTION | Freq: Two times a day (BID) | RESPIRATORY_TRACT | 5 refills | Status: DC

## 2020-11-03 NOTE — Telephone Encounter (Signed)
Patient states his Garlon Hatchet is now $77 and the generic is $680 for a 3 month supply and he cannot pay that. Last time it was only around $12 for 2 months. Patient only has enough medication for today.   Also, patient is having breathing troubles that is causing him to double up on his meds. Patient is using a lot more oxygen.  Please advise.

## 2020-11-03 NOTE — Telephone Encounter (Signed)
No, there is no other medication.  I am sure Perforomist is much cheaper, but this is listed on his allergy list.  He is never done well with a ICS/LABA that is inhaled.  He was on Symbicort for a while as well as Advair, but he never felt like these worked as well.  Jonathon Marvel, MD Allergy and [place] [facility] [place]

## 2020-11-03 NOTE — Telephone Encounter (Signed)
Spoke with pharmacy stating patient has a deductible that he needs to meet also has a Research scientist (medical) that will soon run out. We can send in a 30 day supply to cover it at $11 a month but until he meets the out of pocket expense with co-pay card

## 2020-11-03 NOTE — Telephone Encounter (Signed)
PA initiated through [email or web address] to see if we could get this covered and it stated Drug is covered by current benefit plan. No further PA activity needed

## 2020-11-03 NOTE — Telephone Encounter (Signed)
Spoke with patient and advise him that pharmacy stated he has assistant funds that can help with his medication for a few months. Sent in Rx for 30 day instead of 90 days and will be at $11. Patient wanted to know if there is a cheaper medication since this s medication go up in the end. Any advise Dr. Ernst Bowler!

## 2020-11-04 NOTE — Telephone Encounter (Signed)
Called patient and informed him of the message from Dr. Ernst Bowler. Patient expressed understanding. Patient asked what he should do for his oxygen levels. Patient expressed that he is getting nose bleeds from the oxygen. Patient also stated that since Covid he is using his oxygen more to where he is using 2L up to 4L. Patient states that when he uses 1.5L he is still having nose bleeds. Patient stated that he rather use gas air than the tanks. I informed patient that I was not aware of any of his oxygen use due to there not being any updated notes from our practice. I advised patient that I'd make Dr. Ernst Bowler aware of his concerns. Patient also wanted to know if he's be getting more oxygen from our practice due to him getting oxygen from [name] in the past. Please advise on his oxygen liter usage and his his refill. Thank you.

## 2020-11-04 NOTE — Telephone Encounter (Signed)
We have never prescribed oxygen. He has a pulmonologist. I am trained in pediatrics and have never prescribed oxygen. He is well aware of this.   [name], MD Allergy and [place] [facility] [place]

## 2020-11-07 NOTE — Telephone Encounter (Signed)
Patient verbalized understanding and was wondering if Dr. Ernst Bowler could recommend a pulmonologist for him since he requested one a month ago and still waiting. Patient contacted his PCP who he mentioned had difficulty finding a pulmonologist as well. Please advise on recommendation?

## 2020-11-07 NOTE — Telephone Encounter (Signed)
I believe there was only one with his particular Medicare plan. Routing to [place] since she had looked into this already.   [name], MD Allergy and [place] [facility] [place]

## 2020-11-07 NOTE — Telephone Encounter (Signed)
Noted thanks °

## 2020-11-09 NOTE — Telephone Encounter (Signed)
As documented in the telephone contact on 09/27/2020 I originally had the patient scheduled for the one and only pulmonary doctor that would take his insurance as his insurance is technically considered Out of network for [employer]. The patient has also fired or been discharged from other places so I can't schedule him at their offices either. He informed Mel Almond that he did not want to go to this doctor because he was listed as a sleep doctor and he doesn't want to see that type of doctor. Lonn Georgia called the patient on 10/21/20 and told him he needed to contact his insurance to see if there was anywhere else he could go.   Did the patient contact his insurance to see if there was somewhere else he can go. His best bet is to keep the appointment on 11/15/2020 @ 10 with the [facility] sleep/pulmonologist. This provider is still a pulmonologist at the end of the day.   Thanks

## 2020-11-10 NOTE — Telephone Encounter (Signed)
When speaking with the patient at that time he stated that we had suggestions for him and he was unclear as to where to go or who to see. Patient must not have called insurance provider to see where he can go.

## 2020-11-10 NOTE — Telephone Encounter (Signed)
Called and spoke with patient regarding his appointment with pulmonologist 11/15/2020 at 10:00 am.  Patient confirmed he is keeping appointment and fine to see provider.  Patient stated he did not want or need to research other providers.  Patient is having to change insurance at the end of February and will keep [name] updated next month on insurance change.

## 2020-11-15 ENCOUNTER — Institutional Professional Consult (permissible substitution): Payer: Medicare Other | Admitting: Pulmonary Disease

## 2020-11-21 ENCOUNTER — Telehealth: Payer: Self-pay

## 2020-11-21 NOTE — Telephone Encounter (Signed)
Phone call placed to patient to check in and offer to schedule visit with Palliative care NP. Patient shared update on health since last visit. Scheduled visit for 12/15/20 @ 1pm.

## 2020-11-25 ENCOUNTER — Institutional Professional Consult (permissible substitution): Payer: Medicare Other | Admitting: Pulmonary Disease

## 2020-12-01 ENCOUNTER — Telehealth: Payer: Self-pay

## 2020-12-01 ENCOUNTER — Telehealth: Payer: Self-pay | Admitting: Allergy & Immunology

## 2020-12-01 NOTE — Telephone Encounter (Signed)
Called patient to offer appt but did not receive an answer.

## 2020-12-01 NOTE — Telephone Encounter (Signed)
I tried calling and I had to leave a message. He has not returned my call.

## 2020-12-01 NOTE — Telephone Encounter (Signed)
Lonn Georgia talked to him as well. See other encounter. He is scheduled for a Dupixent injection.  [name], MD Allergy and [place] [facility] [place]

## 2020-12-01 NOTE — Telephone Encounter (Signed)
Patient contacted our Biologics coordinator and sounded like he was gasping for air.  Can someone call him to schedule a televisit.  I do not of and has any availabilities.  I might have some as well. We just need to get him seen.  He did NOT keep his appointment with pulmonology, unfortunately.  [name], MD Allergy and [place] [facility] [place]

## 2020-12-01 NOTE — Telephone Encounter (Signed)
You have a good sense of his clinical picture. I like this plan. Thank you for scheduling him for a Dupixent start. We are going to just use a sample that day.   [name], MD Allergy and [place] [facility] [place]

## 2020-12-01 NOTE — Telephone Encounter (Signed)
[name] is calling to schedule an appointment with the patient now on [place] schedule for today.

## 2020-12-01 NOTE — Telephone Encounter (Signed)
I spent 17 minutes on phone with patient. He wishes to come in and start [place]. I went over at length the pre and post medication regimen including prednisone and cetirizine dosing that Dr. Ernst Bowler had advised on previously. I also strongly advised he get in with Pulmonology sooner rather than later due to him having to cancel the 11-15-20 appt because he had a skin cancer surgery. He also wound up at the hospital due to loss of vision and some other things. He left against medical advice because he claimed he felt better. I stressed the importance of getting in with pulm to assess his oxygen needs. He did understand. I told to cancel the injection with [name] if he is able to get with Pulmonology before or on that date. He said okay. He claims he is not able to get air and he is running out of air. He is using all meds as prescribed as well as his oxygen 2lpm-3lpm. I advised to go to ED or call 911 and he told me he is not calling them and would drive himself.

## 2020-12-02 NOTE — Telephone Encounter (Signed)
I did reach out to patient again after seeing he had already called in once before. I was not able to get in touch with him but did see that someone else within the practice was reaching out. I will update the appt notes to reflect the use of a sample.

## 2020-12-02 NOTE — Telephone Encounter (Signed)
fyi

## 2020-12-06 ENCOUNTER — Telehealth: Payer: Self-pay

## 2020-12-06 NOTE — Telephone Encounter (Signed)
TC placed to patient in response to received my chart message received. Unable to leave VM and mailbox is full.

## 2020-12-14 ENCOUNTER — Ambulatory Visit: Payer: Self-pay

## 2020-12-15 ENCOUNTER — Other Ambulatory Visit: Payer: Medicare HMO | Admitting: Hospice

## 2020-12-19 NOTE — Telephone Encounter (Signed)
Patient called to move his Dupixent injection to earlier in the day on Thursday. Patient was unsure of how to medicate before coming in for his injection, he could not remember what Lonn Georgia told him. He went up to taking 10MG  of prednisone instead of 5MG . Patient states he has been waking up around 2am with chest pains/ pressure since increasing prednisone.  Please advise.

## 2020-12-19 NOTE — Telephone Encounter (Signed)
Please advise 

## 2020-12-22 ENCOUNTER — Other Ambulatory Visit: Payer: Medicare HMO | Admitting: Hospice

## 2020-12-22 ENCOUNTER — Ambulatory Visit: Payer: Self-pay

## 2020-12-22 ENCOUNTER — Other Ambulatory Visit: Payer: Self-pay

## 2020-12-22 DIAGNOSIS — R0602 Shortness of breath: Secondary | ICD-10-CM

## 2020-12-22 DIAGNOSIS — J449 Chronic obstructive pulmonary disease, unspecified: Secondary | ICD-10-CM

## 2020-12-22 DIAGNOSIS — Z515 Encounter for palliative care: Secondary | ICD-10-CM

## 2020-12-22 NOTE — Progress Notes (Signed)
PATIENT NAME: [name] [place] [id number] [phone] (home)  DOB: July 16, 1951 MRN: [id number]  PRIMARY CARE PROVIDER:    Raelyn Ensign,  [address] [place] [id number] [phone]  REFERRING PROVIDER:   Einar Pheasant, DO [address],  [place] [id number] [phone]  RESPONSIBLE PARTY: [facility] [phone] c Emergency contact: Zakariye Nee [phone]- spouse  CHIEF COMPLAIN: palliative care visit/shortness of breath  Visit is to build trust and highlight Palliative Medicine as specialized medical care for people living with serious illness, aimed at facilitating better quality of life through symptoms relief, assisting with advance care plan and establishing goals of care.  Discussion on the difference between Palliative and Hospice care.  Visit consisted of counseling and education dealing with the complex and emotionally intense issues of symptom management and palliative care in the setting of serious and potentially life-threatening illness. Palliative care team will continue to support patient, patient's family, and medical team.  RECOMMENDATIONS/PLAN:   Advance Care Planning: Our advance care planning conversation included a discussion about  the value and importance of advance care planning, exploration of goals of care in the event of a sudden injury or illness, identification and preparation of a healthcare agent and review and updating or creation of an advance directive document. Provided general support, encouragement and ample emotional support.  CODE STATUS: Patient is a Full code; he said he will further discuss with his wife and may change his mind in the future.   GOALS OF CARE: Goals of care include to maximize quality of life and symptom management.  I spent 46  minutes providing this initial consultation. More than 50% of the time in this consultation was spent on counseling patient  and coordinating communication. -------------------------------------------------------------------------------------------------------------------------------------- Symptom Management/Plan:  Shortness of breath related to COPD/asthma. Continue on oxygen supplementation at 2-3L/Min prn shortness of breath/hypoxia. Continue with breathing treatments as ordered; Albuterol last used 3 days ago. Pulmonologist consult as needed. Patient says he sees an allergy/asthma specialist. Patient has appointment with PCP at [facility].  Palliative care will continue to monitor for symptom management/decline and make recommendations as needed. Return 6 weeks or prn. Encouraged to call provider sooner with any concerns.   PPS: 60%  Family /Caregiver/Community Supports: Patient lives at home with his wife with his mother in [employer].    HISTORY OF PRESENT ILLNESS:  JAICION LAURIE is a 70 y.o. male with multiple medical problems including shortness of breath related to history of COPD/asthma, chronic, intermittent with waning and exacerbation. He said he has shortness of breath with exertion like when he does yard work; his Albuterol and breathing treatments are helpful. Shortness of breath impairs his activities of daily living and quality of life. History of COPD with O2 dependency, Tuberculosis, Prostate Cancer, severe allergies, anxiety and schizophrenia.  History obtained from review of EMR and discussion with patient.   Review and summarization of Epic records shows history from other than patient. Rest of 10 point ROS asked and negative.  Palliative Care was asked to follow this patient by consultation request of Einar Pheasant, DO to help address complex decision making in the context of advance care planning and goals of care clarification.    HOSPICE ELIGIBILITY/DIAGNOSIS: TBD  PAST MEDICAL HISTORY:  Past Medical History:  Diagnosis Date  . Anxiety disorder   . Arthritis    BILATERAL  SHOULDERS, ELBOWS  AND HANDS AND LEFT HIP AND KNEES--HX CORTISONE SHOTS IN SHOULDERS, ELBOWS, HIP AND KNEES  . Basal cell carcinoma of back 04/30/2016   Dermatologist- Dr Nevada Crane;   MOHS sx- Dr Levada Dy   . Bladder outlet obstruction   . Chronic idiopathic constipation 05/08/2015  . Complication of anesthesia    DIFFICULT WAKING   . COPD (chronic obstructive pulmonary disease) Gold C Frequent exacerbations 01/13/2014   [name] 07/08/14: FeV1 51% FeV1/FVC 66% FVC 59% 10/5/2015ONO RA was normal  10/13/2014  ONO on RA NORMAL   . COPD, frequent exacerbations ([place])    pulmologist-  dr Joya Gaskins--  [place] Rm Stage C.04-25-15 recent COPD exacerbation-much improved now, after tx. in ER [facility].  Marland Kitchen Depression   . Diabetes mellitus without complication ([place])    BODERLINE - DIET CONTROL  . Dysrhythmia    PVC'S  . Emphysema lung ([place])    stage 2  . Family history of adverse reaction to anesthesia    father would wake up with agitation   . Former smoker 01/13/2014  . Gastroesophageal reflux disease without esophagitis   . Heavy alcohol use 04/30/2016  . History of chronic bronchitis   . History of oxygen administration    oxygen use 2 l/m nasally at bedtime and exertional occasions  . History of rheumatic fever   . History of TB (tuberculosis)    1984--  hospitalized for 4 month treatment  . History of urinary retention   . Hx of multiple concussions    x 2 per patient   . Hypertension   . Melanoma ([place])   . Nocturnal oxygen desaturation    USES O2 NIGHTLY  . OSA (obstructive sleep apnea) 01/13/2014  . PONV (postoperative nausea and vomiting)   . Post-polio syndrome    polio at age 30--PT WAS IN IRON LUNG; PT WAS IN W/C UNTIL AGE 18; STILL HAS WEAKNESS RIGHT SIDE  . Prostate cancer ([place])   . Schizophrenia ([place])   . Tuberculosis    Hosp 4 months rx , left early   . Urticaria      SOCIAL HX:  Social History   Tobacco Use  . Smoking status: Former Smoker    Years: 47.00    Types: Pipe    Quit date:  08/25/2013    Years since quitting: 7.3  . Smokeless tobacco: Former Systems developer    Types: Chew  . Tobacco comment: 5-6 times smoking a pipe  Substance Use Topics  . Alcohol use: Yes    Alcohol/week: 14.0 standard drinks    Types: 14 Cans of beer per week    Comment: 2 cans daily    FAMILY HX:  Family History  Problem Relation Age of Onset  . Alzheimer's disease Father 4       Deceased  . Stomach cancer Father   . Heart attack Father   . Heart disease Father   . Skin cancer Mother        Facial-Living  . Alcohol abuse Sister        x2  . Mental illness Sister        x2  . Alcohol abuse Sister   . Diabetes Maternal Aunt        x2  . Thyroid disease Maternal Aunt        x4  . Diabetes Maternal Uncle   . Tuberculosis Paternal Grandfather   . Tuberculosis Paternal Grandmother   . Alzheimer's disease Paternal Aunt   . Alzheimer's disease Paternal Uncle   .  Colon cancer Neg Hx   . Colon polyps Neg Hx   . Crohn's disease Neg Hx   . Ulcerative colitis Neg Hx   . Allergic rhinitis Neg Hx   . Angioedema Neg Hx   . Asthma Neg Hx   . Eczema Neg Hx   . Urticaria Neg Hx     Review of lab tests/diagnostics   No results for input(s): WBC, HGB, HCT, PLT, MCV in the last 168 hours. No results for input(s): NA, K, CL, CO2, BUN, CREATININE, GLUCOSE in the last 168 hours. Latest GFR by Cockcroft Gault (not valid in AKI or ESRD) CrCl cannot be calculated (Patient's most recent lab result is older than the maximum 21 days allowed.). No results for input(s): AST, ALT, ALKPHOS, GGT in the last 168 hours.  Invalid input(s): TBILI, CONJBILI, ALB, TOTALPROTEIN No components found for: ALB No results for input(s): APTT, INR in the last 168 hours.  Invalid input(s): PTPATIENT No results for input(s): BNP, PROBNP in the last 168 hours.  ALLERGIES:  Allergies  Allergen Reactions  . Aspirin Anaphylaxis  . [place] Venom Anaphylaxis  . Ivp Dye [Iodinated Diagnostic Agents] Anaphylaxis  .  Levaquin [Levofloxacin In D5w] Shortness Of Breath and Swelling    In addition: sweating, chest pain, and diarrhea.   . Nsaids Anaphylaxis  . Penicillins Anaphylaxis    Heart stops Has patient had a PCN reaction causing immediate rash, facial/tongue/throat swelling, SOB or lightheadedness with hypotension: yes Has patient had a PCN reaction causing severe rash involving mucus membranes or skin necrosis: NO Has patient had a PCN reaction that required hospitalization yes Has patient had a PCN reaction occurring within the last 10 years: No If all of the above answers are "NO", then may proceed with Cephalosporin use.   . Tolmetin Anaphylaxis  . Fasenra [Benralizumab] Hives  . Perforomist [Formoterol]     Increased wheezing, shortness of breath  . Buprenorphine Hcl Nausea And Vomiting    Can take with zofran   . Morphine And Related Nausea And Vomiting    Can take with zofran   . Oxycodone Itching and Rash      PERTINENT MEDICATIONS:  Outpatient Encounter Medications as of 12/22/2020  Medication Sig  . albuterol (PROVENTIL) (2.5 MG/3ML) 0.083% nebulizer solution Take 3 mLs (2.5 mg total) by nebulization every 6 (six) hours as needed for wheezing or shortness of breath.  Marland Kitchen albuterol (VENTOLIN HFA) 108 (90 Base) MCG/ACT inhaler Inhale 2 puffs into the lungs every 6 (six) hours as needed for wheezing or shortness of breath.  Marland Kitchen arformoterol (BROVANA) 15 MCG/2ML NEBU Take 2 mLs (15 mcg total) by nebulization 2 (two) times daily.  . baclofen (LIORESAL) 10 MG tablet Take 10 mg by mouth daily.  . budesonide (PULMICORT) 0.5 MG/2ML nebulizer solution Take 2 mLs (0.5 mg total) by nebulization 2 (two) times daily.  . cetirizine (ZYRTEC) 10 MG tablet Take 1 tablet (10 mg total) by mouth 2 (two) times daily.  . colchicine 0.6 MG tablet TK 2 TS PO INITIALLY THEN TK 1 T IN 1 HOUR  . diphenhydrAMINE (SOMINEX) 25 MG tablet Take by mouth.  . EPINEPHrine (EPIPEN 2-PAK) 0.3 mg/0.3 mL IJ SOAJ injection  Inject 0.3 mLs (0.3 mg total) into the muscle as needed for anaphylaxis.  Marland Kitchen ipratropium-albuterol (DUONEB) 0.5-2.5 (3) MG/3ML SOLN Take 3 mLs by nebulization every evening.  . Lidocaine 5 % CREA Apply to perianal area as needed for pain.  Marland Kitchen loratadine (CLARITIN) 10 MG tablet Take  1 tablet (10 mg total) by mouth daily as needed for allergies.  . nitroGLYCERIN (NITROSTAT) 0.4 MG SL tablet Place 1 tablet (0.4 mg total) under the tongue every 5 (five) minutes as needed for chest pain.  Marland Kitchen ondansetron (ZOFRAN ODT) 4 MG disintegrating tablet Take 1 tablet (4 mg total) by mouth every 8 (eight) hours as needed for nausea or vomiting.  . OXYGEN Inhale 2-3 L into the lungs at bedtime. DURING THE DAY IF NEEDED Reported on 11/25/2015  . predniSONE (DELTASONE) 10 MG tablet Take 1 tablet twice a day for four days before your injection, Then take 1 tablet twice a day for four days after your injection, then stop.  Marland Kitchen Respiratory Therapy Supplies (NEBULIZER/TUBING/MOUTHPIECE) KIT 1 each by Does not apply route as directed.   No facility-administered encounter medications on file as of 12/22/2020.    ROS  General: NAD Constitution: Denies fever/chills EYES: denies vision changes ENMT: denies Xerostomia, dysphagia Cardiovascular: denies chest pain Pulmonary: denies  cough, endorses dyspnea on exertion Abdomen: endorses fair appetite, denies constipation or diarrhea GU: denies dysuria MSK: ambulatory without device Skin: denies rashes/bruising Neurological: endorses weakness, denies pain, denies insomnia Psych: Endorses positive mood Heme/lymph/immuno: denies bruises, no abnormal bleeding   PHYSICAL EXAM  Height: 6 feet  Weight:230 Ibs Constitutional: In no acute distress, well developed and well nourished Cardiovascular: regular rate and rhythm; no edema in BLE Pulmonary: no cough, no increased work of breathing during discussion, normal respiratory effort on room air, expiratory wheeze on  auscultation Abdomen: soft, non tender, positive bowel sounds in all quadrants GU:  no suprapubic tenderness Eyes: Normal lids, no discharge, sclera anicteric ENMT: Moist mucous membranes Musculoskeletal:Ambulatory without assistive device Skin: no rash to visible skin, dry skin,warm without cyanosis Psych: non-anxious affect Neurological: Weakness but otherwise non focal Heme/lymph/immuno: no bruises, no bleeding  Thank you for the opportunity to participate in the care of DONTA FUSTER Please call our office at [phone] if we can be of additional assistance.  Note: Portions of this note were generated with Lobbyist. Dictation errors may occur despite best attempts at proofreading.  Teodoro Spray, NP

## 2020-12-22 NOTE — Telephone Encounter (Signed)
He always get himself worked up. How is he doing now?   [name], MD Allergy and [facility] [place]

## 2020-12-22 NOTE — Telephone Encounter (Signed)
Called patient and left message to return call to the office.  Need to get update on how he is doing to update Dr. Ernst Bowler.  Patient needs to get Dupixent rescheduled depending on how he is doing and clarification on how to medicate prior to injection.

## 2020-12-27 NOTE — Telephone Encounter (Signed)
Called and left voicemail message for patient to return call to the office.

## 2020-12-30 NOTE — Telephone Encounter (Signed)
Called patient and left voicemail message for him to return call to the office.

## 2021-01-10 ENCOUNTER — Ambulatory Visit: Payer: Self-pay | Admitting: Surgery

## 2021-01-13 DIAGNOSIS — F17211 Nicotine dependence, cigarettes, in remission: Secondary | ICD-10-CM | POA: Diagnosis not present

## 2021-01-13 DIAGNOSIS — I25118 Atherosclerotic heart disease of native coronary artery with other forms of angina pectoris: Secondary | ICD-10-CM | POA: Diagnosis not present

## 2021-01-13 DIAGNOSIS — J449 Chronic obstructive pulmonary disease, unspecified: Secondary | ICD-10-CM | POA: Diagnosis not present

## 2021-01-13 DIAGNOSIS — F1020 Alcohol dependence, uncomplicated: Secondary | ICD-10-CM | POA: Diagnosis not present

## 2021-01-13 DIAGNOSIS — K5900 Constipation, unspecified: Secondary | ICD-10-CM | POA: Diagnosis not present

## 2021-01-13 DIAGNOSIS — E663 Overweight: Secondary | ICD-10-CM | POA: Diagnosis not present

## 2021-01-13 DIAGNOSIS — R110 Nausea: Secondary | ICD-10-CM | POA: Diagnosis not present

## 2021-01-13 DIAGNOSIS — I10 Essential (primary) hypertension: Secondary | ICD-10-CM | POA: Diagnosis not present

## 2021-01-13 DIAGNOSIS — R109 Unspecified abdominal pain: Secondary | ICD-10-CM | POA: Diagnosis not present

## 2021-01-27 DIAGNOSIS — F259 Schizoaffective disorder, unspecified: Secondary | ICD-10-CM | POA: Diagnosis not present

## 2021-01-27 DIAGNOSIS — J449 Chronic obstructive pulmonary disease, unspecified: Secondary | ICD-10-CM | POA: Diagnosis not present

## 2021-01-27 DIAGNOSIS — F1021 Alcohol dependence, in remission: Secondary | ICD-10-CM | POA: Diagnosis not present

## 2021-01-27 DIAGNOSIS — I10 Essential (primary) hypertension: Secondary | ICD-10-CM | POA: Diagnosis not present

## 2021-01-27 DIAGNOSIS — Z0000 Encounter for general adult medical examination without abnormal findings: Secondary | ICD-10-CM | POA: Diagnosis not present

## 2021-01-27 DIAGNOSIS — Z1211 Encounter for screening for malignant neoplasm of colon: Secondary | ICD-10-CM | POA: Diagnosis not present

## 2021-01-27 DIAGNOSIS — E663 Overweight: Secondary | ICD-10-CM | POA: Diagnosis not present

## 2021-01-27 DIAGNOSIS — J309 Allergic rhinitis, unspecified: Secondary | ICD-10-CM | POA: Diagnosis not present

## 2021-01-27 DIAGNOSIS — K5900 Constipation, unspecified: Secondary | ICD-10-CM | POA: Diagnosis not present

## 2021-02-03 DIAGNOSIS — F1021 Alcohol dependence, in remission: Secondary | ICD-10-CM | POA: Diagnosis not present

## 2021-02-03 DIAGNOSIS — G14 Postpolio syndrome: Secondary | ICD-10-CM | POA: Diagnosis not present

## 2021-02-03 DIAGNOSIS — G8190 Hemiplegia, unspecified affecting unspecified side: Secondary | ICD-10-CM | POA: Diagnosis not present

## 2021-02-03 DIAGNOSIS — I77819 Aortic ectasia, unspecified site: Secondary | ICD-10-CM | POA: Diagnosis not present

## 2021-02-03 DIAGNOSIS — C61 Malignant neoplasm of prostate: Secondary | ICD-10-CM | POA: Diagnosis not present

## 2021-02-03 DIAGNOSIS — J309 Allergic rhinitis, unspecified: Secondary | ICD-10-CM | POA: Diagnosis not present

## 2021-02-03 DIAGNOSIS — I25118 Atherosclerotic heart disease of native coronary artery with other forms of angina pectoris: Secondary | ICD-10-CM | POA: Diagnosis not present

## 2021-02-03 DIAGNOSIS — Z0001 Encounter for general adult medical examination with abnormal findings: Secondary | ICD-10-CM | POA: Diagnosis not present

## 2021-02-03 DIAGNOSIS — F17211 Nicotine dependence, cigarettes, in remission: Secondary | ICD-10-CM | POA: Diagnosis not present

## 2021-02-07 DIAGNOSIS — J449 Chronic obstructive pulmonary disease, unspecified: Secondary | ICD-10-CM | POA: Diagnosis not present

## 2021-02-13 ENCOUNTER — Telehealth: Payer: Self-pay

## 2021-02-13 NOTE — Telephone Encounter (Signed)
Just a FYI  Patient called and wanted to speak to a nurse. I let him know the triage nurse was on the phone and asked if he could hold a sec till she got off. Patient hung up after 2.5 mins & then called back stating he will just go to another facility where he can get better care and help. Patient states he is done with our office & hung up on me.

## 2021-02-14 NOTE — Telephone Encounter (Signed)
Noted. We are happy to help him transfer his care.   [name], MD Allergy and [facility] [place]

## 2021-02-15 ENCOUNTER — Other Ambulatory Visit: Payer: Self-pay

## 2021-02-15 ENCOUNTER — Telehealth: Payer: Self-pay | Admitting: Allergy & Immunology

## 2021-02-15 MED ORDER — ALBUTEROL SULFATE (2.5 MG/3ML) 0.083% IN NEBU: 2.5000 mg | INHALATION_SOLUTION | Freq: Four times a day (QID) | RESPIRATORY_TRACT | 0 refills | Status: DC | PRN

## 2021-02-15 NOTE — Telephone Encounter (Signed)
Patient is requesting a refill for his albuterol sulfate solution for his nebulizer. [facility] on [address].

## 2021-02-15 NOTE — Telephone Encounter (Signed)
Refill has been sent in to [facility] [address]

## 2021-02-16 ENCOUNTER — Other Ambulatory Visit: Payer: Self-pay

## 2021-02-16 ENCOUNTER — Telehealth: Payer: Self-pay | Admitting: *Deleted

## 2021-02-16 ENCOUNTER — Other Ambulatory Visit: Payer: Medicare (Managed Care) | Admitting: Hospice

## 2021-02-16 DIAGNOSIS — Z515 Encounter for palliative care: Secondary | ICD-10-CM | POA: Diagnosis not present

## 2021-02-16 DIAGNOSIS — F419 Anxiety disorder, unspecified: Secondary | ICD-10-CM | POA: Diagnosis not present

## 2021-02-16 DIAGNOSIS — R0602 Shortness of breath: Secondary | ICD-10-CM

## 2021-02-16 NOTE — Progress Notes (Signed)
[facility] Palliative Care Consult Note Telephone: [phone]  Fax: [phone]  PATIENT NAME: [name] DOB: 09-15-1951 MRN: [id number]  PRIMARY CARE PROVIDER:   Dr. Michell Heinrich [facility] [phone]  REFERRING PROVIDER: Einar Pheasant, DO Einar Pheasant, [place] [address] [place],  [place] [address]   RESPONSIBLE PARTY: Self [phone] c Emergency contact: Ervie Mccard [phone]  Contact Information    Name Relation Home Work Mobile   Breaks Spouse [phone]  [phone]   Taylor,Jeanette Relative [phone]     Gilad, Dugger Mother [phone]        Visit is to build trust and highlight Palliative Medicine as specialized medical care for people living with serious illness, aimed at facilitating better quality of life through symptoms relief, assisting with advance care planning and complex medical decision making.   RECOMMENDATIONS/PLAN:   Advance Care Planning/Code Status: Patient affirmed he is a Full code  Goals of Care: Goals of care include to maximize quality of life and symptom management.  Symptom management/Plan:   Shortness of breath related to COPD/asthma. Manage with  oxygen supplementation at 2-3L/Min prn shortness of breath/hypoxia. Continue with breathing treatments as ordered; Albuterol last used this morning. Pulmonologist consult as needed. Patient says he sees an allergy/asthma specialist - appointment 03/07/2021. Bil Knee pain: Continue Tylenol and  Baclofen as ordered; effective.  Anxiety: Continue Hydroxyzine as ordered. Slow deep breathing.  Constipation: Has Lactulose but not using it because he does not like it. Provided education to take as recommended by PCP.  OTC Miralax 17gram mix in 4-6 ounces of fluid daily prn constipation.  Palliative care will continue to monitor for symptom management/decline and make recommendations as needed. Return6weeks or  prn. Encouraged to call provider sooner with any concerns.   PPS: 60%  Follow up: Palliative care will continue to follow for complex medical decision making, advance care planning, and clarification of goals. Return 6 weeks or prn. Encouraged to call provider sooner with any concerns.  CHIEF COMPLAINT: Palliative follow up visit/shortness of breath  HISTORY OF PRESENT ILLNESS:  Follow up visit for [name], a 70 y.o. male with multiple medical problems including shortness of breath related to history of COPD/asthma, improved with no exacerbation.  He said he continues to have shortness of breath with exertion like when he does yard work; thus impairing his ability to be helpful around the house;  his Albuterol and breathing treatments are helpful. New PCP has set him up with Pulmonologist.  History of COPD with O2 dependency, Tuberculosis, Prostate Cancer, severe allergies, anxiety and schizophrenia.  History obtained from review of EMR and discussion with patient.  All 10 point systems reviewed and are negative except as documented in history of present illness above  Review and summarization of Epic records shows history from other than patient.   Palliative Care was asked to follow this patient by consultation request of Einar Pheasant, DO to help address complex decision making in the context of advance care planning and goals of care clarification.   PPS: 60% ROS  General: NAD, appropriately dressed Constitution: Denies fever/chills EYES: denies vision changes ENMT: denies Xerostomia,  dysphagia Cardiovascular: denies chest pain Pulmonary: denies  cough, denies dyspnea  Abdomen: endorses fair appetite, denies constipation or diarrhea GU: denies dysuria MSK:  endorses ROM limitations, no falls reported Skin: denies rashes/bruising Neurological: endorses weakness, denies pain, denies insomnia Psych: Endorses positive  mood Heme/lymph/immuno: denies bruises, no abnormal  bleeding   PHYSICAL EXAM  Height : 6 feet weight 217 Ibs General: In no acute distress, appropriately dressed Cardiovascular: regular rate and rhythm; no edema in BLE Pulmonary: no cough, no increased work of breathing, normal respiratory effort during visit 02 98% RA Abdomen: soft, non tender, no guarding, positive bowel sounds in all quadrants GU:  no suprapubic tenderness Eyes: Normal lids, no discharge, sclera anicteric ENMT: Moist mucous membranes Musculoskeletal:  Ambulatory without devices Skin: no rash to visible skin, warm without cyanosis,  Psych: flat affect Neurological: Weakness but otherwise non focal Heme/lymph/immuno: no bruises, no bleeding  PERTINENT MEDICATIONS:  Outpatient Encounter Medications as of 02/16/2021  Medication Sig  . albuterol (PROVENTIL) (2.5 MG/3ML) 0.083% nebulizer solution Take 3 mLs (2.5 mg total) by nebulization every 6 (six) hours as needed for wheezing or shortness of breath.  Marland Kitchen albuterol (VENTOLIN HFA) 108 (90 Base) MCG/ACT inhaler Inhale 2 puffs into the lungs every 6 (six) hours as needed for wheezing or shortness of breath.  Marland Kitchen arformoterol (BROVANA) 15 MCG/2ML NEBU Take 2 mLs (15 mcg total) by nebulization 2 (two) times daily.  . baclofen (LIORESAL) 10 MG tablet Take 10 mg by mouth daily.  . budesonide (PULMICORT) 0.5 MG/2ML nebulizer solution Take 2 mLs (0.5 mg total) by nebulization 2 (two) times daily.  . cetirizine (ZYRTEC) 10 MG tablet Take 1 tablet (10 mg total) by mouth 2 (two) times daily.  . colchicine 0.6 MG tablet TK 2 TS PO INITIALLY THEN TK 1 T IN 1 HOUR  . diphenhydrAMINE (SOMINEX) 25 MG tablet Take by mouth.  . EPINEPHrine (EPIPEN 2-PAK) 0.3 mg/0.3 mL IJ SOAJ injection Inject 0.3 mLs (0.3 mg total) into the muscle as needed for anaphylaxis.  Marland Kitchen ipratropium-albuterol (DUONEB) 0.5-2.5 (3) MG/3ML SOLN Take 3 mLs by nebulization every evening.  . Lidocaine 5 % CREA Apply to perianal area as needed for pain.  Marland Kitchen loratadine (CLARITIN)  10 MG tablet Take 1 tablet (10 mg total) by mouth daily as needed for allergies.  . nitroGLYCERIN (NITROSTAT) 0.4 MG SL tablet Place 1 tablet (0.4 mg total) under the tongue every 5 (five) minutes as needed for chest pain.  Marland Kitchen ondansetron (ZOFRAN ODT) 4 MG disintegrating tablet Take 1 tablet (4 mg total) by mouth every 8 (eight) hours as needed for nausea or vomiting.  . OXYGEN Inhale 2-3 L into the lungs at bedtime. DURING THE DAY IF NEEDED Reported on 11/25/2015  . predniSONE (DELTASONE) 10 MG tablet Take 1 tablet twice a day for four days before your injection, Then take 1 tablet twice a day for four days after your injection, then stop.  Marland Kitchen Respiratory Therapy Supplies (NEBULIZER/TUBING/MOUTHPIECE) KIT 1 each by Does not apply route as directed.   No facility-administered encounter medications on file as of 02/16/2021.    HOSPICE ELIGIBILITY/DIAGNOSIS: TBD  PAST MEDICAL HISTORY:  Past Medical History:  Diagnosis Date  . Anxiety disorder   . Arthritis    BILATERAL SHOULDERS, ELBOWS AND HANDS AND LEFT HIP AND KNEES--HX CORTISONE SHOTS IN SHOULDERS, ELBOWS, HIP AND KNEES  . Basal cell carcinoma of back 04/30/2016   Dermatologist- Dr [place] Crane;   MOHS sx- Dr Levada Dy   . Bladder outlet obstruction   . Chronic idiopathic constipation 05/08/2015  . Complication of anesthesia    DIFFICULT WAKING   . COPD (chronic obstructive pulmonary disease) Gold C Frequent exacerbations 01/13/2014   [name] 07/08/14: FeV1 51% FeV1/FVC 66% FVC 59% 10/5/2015ONO RA was normal  10/13/2014  ONO on RA NORMAL   . COPD, frequent exacerbations ([place])    pulmologist-  dr Joya Gaskins--  [place] Rm Stage C.04-25-15 recent COPD exacerbation-much improved now, after tx. in ER [facility].  Marland Kitchen Depression   . Diabetes mellitus without complication ([place])    BODERLINE - DIET CONTROL  . Dysrhythmia    PVC'S  . Emphysema lung ([place])    stage 2  . Family history of adverse reaction to anesthesia    father would wake up with agitation   . Former  smoker 01/13/2014  . Gastroesophageal reflux disease without esophagitis   . Heavy alcohol use 04/30/2016  . History of chronic bronchitis   . History of oxygen administration    oxygen use 2 l/m nasally at bedtime and exertional occasions  . History of rheumatic fever   . History of TB (tuberculosis)    1984--  hospitalized for 4 month treatment  . History of urinary retention   . Hx of multiple concussions    x 2 per patient   . Hypertension   . Melanoma ([place])   . Nocturnal oxygen desaturation    USES O2 NIGHTLY  . OSA (obstructive sleep apnea) 01/13/2014  . PONV (postoperative nausea and vomiting)   . Post-polio syndrome    polio at age 44--PT WAS IN IRON LUNG; PT WAS IN W/C UNTIL AGE 81; STILL HAS WEAKNESS RIGHT SIDE  . Prostate cancer ([place])   . Schizophrenia ([place])   . Tuberculosis    Hosp 4 months rx , left early   . Urticaria      SOCIAL HX: '@SOCX'  Patient lives at home   for ongoing care  FAMILY HX:  Family History  Problem Relation Age of Onset  . Alzheimer's disease Father 4       Deceased  . Stomach cancer Father   . Heart attack Father   . Heart disease Father   . Skin cancer Mother        Facial-Living  . Alcohol abuse Sister        x2  . Mental illness Sister        x2  . Alcohol abuse Sister   . Diabetes Maternal Aunt        x2  . Thyroid disease Maternal Aunt        x4  . Diabetes Maternal Uncle   . Tuberculosis Paternal Grandfather   . Tuberculosis Paternal Grandmother   . Alzheimer's disease Paternal Aunt   . Alzheimer's disease Paternal Uncle   . Colon cancer Neg Hx   . Colon polyps Neg Hx   . Crohn's disease Neg Hx   . Ulcerative colitis Neg Hx   . Allergic rhinitis Neg Hx   . Angioedema Neg Hx   . Asthma Neg Hx   . Eczema Neg Hx   . Urticaria Neg Hx     Review lab tests/diagnostics No results for input(s): WBC, HGB, HCT, PLT, MCV in the last 168 hours. No results for input(s): NA, K, CL, CO2, BUN, CREATININE, GLUCOSE in the last 168  hours. CrCl cannot be calculated (Patient's most recent lab result is older than the maximum 21 days allowed.).  ALLERGIES:  Allergies  Allergen Reactions  . Aspirin Anaphylaxis  . Hornet Venom Anaphylaxis  . Ivp Dye [Iodinated Diagnostic Agents] Anaphylaxis  . Levaquin [Levofloxacin In D5w] Shortness Of Breath and Swelling    In addition: sweating, chest pain, and diarrhea.   . Nsaids Anaphylaxis  . Penicillins Anaphylaxis  Heart stops Has patient had a PCN reaction causing immediate rash, facial/tongue/throat swelling, SOB or lightheadedness with hypotension: yes Has patient had a PCN reaction causing severe rash involving mucus membranes or skin necrosis: NO Has patient had a PCN reaction that required hospitalization yes Has patient had a PCN reaction occurring within the last 10 years: No If all of the above answers are "NO", then may proceed with Cephalosporin use.   . Tolmetin Anaphylaxis  . Fasenra [Benralizumab] Hives  . Perforomist [Formoterol]     Increased wheezing, shortness of breath  . Buprenorphine Hcl Nausea And Vomiting    Can take with zofran   . Morphine And Related Nausea And Vomiting    Can take with zofran   . Oxycodone Itching and Rash      I spent 60 minutes providing this consultation; this includes time spent with patient/family, chart review and documentation. More than 50% of the time in this consultation was spent on counseling and coordinating communication   Thank you for the opportunity to participate in the care of HUGHIE MELROY Please call our office at [phone] if we can be of additional assistance.  Note: Portions of this note were generated with [employer]. Dictation errors may occur despite best attempts at proofreading.  Teodoro Spray, NP

## 2021-02-16 NOTE — Telephone Encounter (Signed)
PA submitted through cover my meds for Brovana(Arformoterol Tartrate) 62mcg/2mL neb solution. Key: [id number] waiting for determination.

## 2021-02-16 NOTE — Telephone Encounter (Signed)
Deniedtoday This request was denied under your Medicare Part D benefit; however, coverage for the requested drug(s) has been approved under Medicare Part B. [employer] follows Medicare rules. The Medicare rule in Chapter 6 of the Prescription Drug Manual says that drugs covered under the Part B benefit cannot be covered under Part D. Your pharmacy tells [name] where you live when they submit pharmacy claims. Your pharmacy has indicated you are getting this medication at home. The Medicare Benefit Manual (Chapter 15, Section 110.3) says Medicare Part B pays for drugs that require administration by the use of a piece of covered durable medical equipment (DME) such as a nebulizer. [employer] has approved coverage for your drug under your Part B benefit through 12.31.22. If you think Medicare Part D should cover this drug for you, you may appeal.

## 2021-02-24 ENCOUNTER — Other Ambulatory Visit: Payer: Self-pay | Admitting: Allergy & Immunology

## 2021-03-07 ENCOUNTER — Ambulatory Visit: Payer: Medicare HMO | Admitting: Allergy & Immunology

## 2021-03-07 ENCOUNTER — Encounter: Payer: Self-pay | Admitting: Allergy & Immunology

## 2021-03-07 ENCOUNTER — Other Ambulatory Visit: Payer: Self-pay

## 2021-03-07 VITALS — BP 134/78 | HR 70 | Temp 98.3°F | Resp 16 | Ht 71.0 in | Wt 216.8 lb

## 2021-03-07 DIAGNOSIS — J449 Chronic obstructive pulmonary disease, unspecified: Secondary | ICD-10-CM

## 2021-03-07 DIAGNOSIS — J3089 Other allergic rhinitis: Secondary | ICD-10-CM

## 2021-03-07 DIAGNOSIS — T63441D Toxic effect of venom of bees, accidental (unintentional), subsequent encounter: Secondary | ICD-10-CM

## 2021-03-07 DIAGNOSIS — J302 Other seasonal allergic rhinitis: Secondary | ICD-10-CM

## 2021-03-07 DIAGNOSIS — K219 Gastro-esophageal reflux disease without esophagitis: Secondary | ICD-10-CM

## 2021-03-07 DIAGNOSIS — T63481D Toxic effect of venom of other arthropod, accidental (unintentional), subsequent encounter: Secondary | ICD-10-CM

## 2021-03-07 NOTE — Progress Notes (Signed)
FOLLOW UP  Date of Service/Encounter:  03/07/21   Assessment:   Severe persistent asthmawith COPD overlap  Systemic reaction to Berna Bue (priro to my taking over his care)  Systemic reaction versus panic attack to Nucala  Chronic prednisone use (15+ years) - currently on 5mg  daily(with intermittent self-induced bursts up to 20mg  daily)  Seasonal and perennial allergic rhinitis(grasses, weeds, trees, cat, dog, dust mite, and cockroach)  History of non-compliance  Pulmonary nodules (stable per last chest CT June 2019)  Complex medical history, including bipolar depression  GERD -on omeprazole with continued marked use of Tums (8-10 per day)  OSA - with history of non-compliance with CPAP    [name] presents for a follow up visit. He is non compliant again despite our best efforts. He is interested in starting Lilly again, but he has never followed up for injections in the past. We will reach out to him again and make sure that he is interested. We are not going to get it approved again and instead just use a sample when/if he finally shows up for the [place]. Regarding Tommye Standard, this has proven hard to get approved, but he does fail Dupixent for any reason, we could certainly attempt to get Tezspire approved. He is also interested in another Pulmonology referral. We will try to refer him to [facility] instead of our pulmonologists since he has fired the vast majority of them.   Plan/Recommendations:   1. Severe persistent asthma with COPD overlap - We are going to decrease the prednisone to 5mg  today. - If you are interested in [place], we will work on getting it approved again.  - We will refer you to Pulmonology ONCE you get your new insurance card (BRING IT TO [name] WHEN YOU GET IT). - We can also submit for Tezspire (INFORMATION PROVIDED) once you get your new insurance card.  - Try adding a flutter valve TWICE DAILY after using your nebulizers to  break up mucous and help it come up.     - Daily controller medication(s): Brovana (aformoterol) 47mcg THREE TIMES DAILY + Pulmicort (budesonide) 0.5 mg nebulizer treatment TWICE DAILY + Yupelri ONE TIME DAILY + montelukast 10 mg ONE TIME DAILY + prednisone 5mg  ONCE DAILY - Prior to physical activity: ProAir 2 puffs 10-15 minutes before physical activity. - Rescue medications: albuterol nebulizer one vial every 4-6 hours as needed or DuoNeb nebulizer one vial every 4-6 hours as needed - Asthma control goals:  * Full participation in all desired activities (may need albuterol before activity) * Albuterol use two time or less a week on average (not counting use with activity) * Cough interfering with sleep two time or less a month * Oral steroids no more  than once a year * No hospitalizations  2. Perennial and seasonal allergic rhinitis - Continue with loratadine 10mg  daily as needed. - Continue with fluticasone 1-2 sprays per nostril daily as needed.   3. GERD - Continue with pantoprazole 40mg  daily.  - Monitor Tums usage.   4. Stinging insect anaphylaxis (honeybee, hornets, yellow jacket, wasp) - We can certainly consider venom immunotherapy, but at this point I think we should hold off.  - I would like to get your breathing under better control first.   5. Return in about 6 months (around 09/07/2021).    Subjective:   [name] is a 70 y.o. male presenting today for follow up of  Chief Complaint  Patient presents with  . Asthma  [name] has a history of the following: Patient Active Problem List   Diagnosis Date Noted  . Pneumonia due to COVID-19 virus 07/27/2020  . COPD with acute exacerbation ([place]) 12/04/2018  . PSVT (paroxysmal supraventricular tachycardia) ([place]) 09/23/2018  . Seasonal and perennial allergic rhinitis 07/30/2018  . Asthma-COPD overlap syndrome ([place]) 07/07/2018  . S/P arthroscopy of right shoulder 04/25/2017  . Generalized anxiety  disorder 04/22/2017  . Severe persistent asthma without complication 24/26/8341  . Bee sting-induced anaphylaxis 02/12/2017  . Anaphylactic reaction due to food, initial encounter 02/12/2017  . Current use of beta blocker 02/12/2017  . Allergic rhinitis 01/15/2017  . COPD exacerbation ([place]) 11/04/2016  . Asthma exacerbation 11/03/2016  . Nausea without vomiting 09/17/2016  . Shortness of breath   . Diabetes mellitus type 2 in obese ([place]) 06/26/2016  . non-specific Chest pain 06/12/2016  . Patient's noncompliance with other medical treatment and regimen 06/02/2016  . Hyperlipidemia, mixed 06/02/2016  . Elevated liver enzymes  06/02/2016  . Schizophrenia ([place]) 04/30/2016  . Environmental and seasonal allergies 04/30/2016  . Diabetes mellitus without complication- (diet controlled; w/o proteinuria) 04/30/2016  . Heavy alcohol use- Many yrs  04/30/2016  . Basal cell carcinoma of back 04/30/2016  . Obesity 04/30/2016  . Hx of multiple concussions 04/30/2016  . h/o Vitamin D deficiency 04/30/2016  . Screen for sexually transmitted diseases 04/30/2016  . Immunocompromised state ([place]) 04/30/2016  . Cervical radiculopathy 01/15/2016  . Alcohol withdrawal ([place]) 11/22/2015  . Alcohol abuse with alcohol-induced mood disorder ([place]) 11/22/2015  . h/o Suicidal thoughts 11/21/2015  . Dry mouth 10/07/2015  . Anxiety 09/19/2015  . OAB (overactive bladder) 07/27/2015  . Chronic idiopathic constipation 05/08/2015  . Screening for colon cancer   . Benign neoplasm of cecum   . Benign neoplasm of ascending colon   . Benign neoplasm of transverse colon   . Benign neoplasm of sigmoid colon   . Rectal polyp   . Gastroesophageal reflux disease without esophagitis   . Esophageal stricture   . Joint stiffness of multiple sites 10/17/2014  . Benign localized hyperplasia of prostate with urinary obstruction 09/29/2014  . concerns for memory loss 09/13/2014  . Tuberculosis   . Diverticulum of bladder  06/17/2014  . Acute urinary retention 05/31/2014  . Neuropathy ([place]) 04/11/2014  . Post-polio syndrome 01/13/2014  . Other emphysema ([place]) 01/13/2014  . Hypertension 01/13/2014  . Former smoker 01/13/2014  . h/o Prostate cancer 01/13/2014  . History of tuberculosis 01/13/2014  . Bipolar disorder ([place]) 01/13/2014  . Explosive personality disorder ([place]) 01/13/2014  . Erectile dysfunction 01/13/2014  . Nephrolithiasis 01/13/2014  . Chronic pain 01/13/2014  . Obstructive sleep apnea 01/13/2014  . Alcoholic gastritis 96/22/2979  . Nocturnal hypoxia 08/10/2013    History obtained from: chart review and patient.  Smokey is a 70 y.o. male presenting for a follow up visit.  I last saw him in December 2021.  At that time, he was actually doing very well despite the fact that he was not using his Pulmicort.  He was continuing on prednisone 10 mg daily.  He requested a referral to a pulmonologist but unfortunately we were only able to find 1 that was available as a result of his insurance and the fact that he has fired so many pulmonologist in the past.  He was endorsing an interest in starting [place].  We decreased his prednisone to 5 mg daily and continued with Brovana 3 times daily, Yupelri 1 time daily, montelukast 10  mg daily, and prednisone 5 mg daily.  For his rhinitis, would continue with Claritin as well as Flonase.  We also continued with pantoprazole 40 mg daily.  Since last visit, he told us that he was switching to a different practice for management of his asthma and allergies.  This was on May 2.  We told him we would help him to transfer once he chose a place. He does not remember having this conversation at all. He tells me that he "was probably drunk" with a smile. He is going through a lot at home. Apparently his mother-in-law moved into them and this is not going well at all.   Asthma/Respiratory Symptom History: Asthma is not well controlled. He is using Mucinex every day  multiple times per day. He does not use a flutter valve.  He has never really heard of this at all. Compliance is still an issue. He uses the Yupelri twice daily but does not use the Pulmicort at all. He does have the [place], but he tells me today that it does not work as well as the "arformoterol", which of course I remind him is the same as [place]. He does not really have a good reason for not using his medications as prescribed. He is currently on the prednisone 5 mg daily and occasionally goes up to 10mg  when he is not feeling great.   He received one dose of Dupixent in May 2021. He tells me that he only received one injection, although our records do confirm that he received two injections for his loading dose. He woke up at 2am in the morning and he was told to go to the ED for chest pain. He took a larger dose of prednisone and it resolved. This was his "reaction" to [place]. It is a moot poin since he would need another loading dose anyway if he decides to go through with restarting the [place]. He also brings up Tezspire and is interested in trying this one as well. He is in the midst of changing his insurance. This is going to change on June 1st, 2022.   Allergic Rhinitis Symptom History: He remains on the Claritin and the Flonase. He is taking montelukast as well. He has not needed antibiotics as far as he knows.   Evidently has been diagnosed with early onset dementia.   His wife is having surgery next week for her ear. She is having CSF leakage so this is going to be patched.   Otherwise, there have been no changes to his past medical history, surgical history, family history, or social history.    Review of Systems  Constitutional: Negative.  Negative for chills, fever, malaise/fatigue and weight loss.  HENT: Negative for congestion, ear discharge, ear pain and sinus pain.   Eyes: Negative for pain, discharge and redness.  Respiratory: Positive for shortness of breath and  wheezing. Negative for cough and sputum production.   Cardiovascular: Negative.  Negative for chest pain and palpitations.  Gastrointestinal: Negative for abdominal pain, constipation, diarrhea, heartburn, nausea and vomiting.  Skin: Negative.  Negative for itching and rash.  Neurological: Negative for dizziness and headaches.  Endo/Heme/Allergies: Positive for environmental allergies. Does not bruise/bleed easily.       Objective:   Blood pressure 134/78, pulse 70, temperature 98.3 F (36.8 C), resp. rate 16, height 5\' 11"  (1.803 m), weight 216 lb 12.8 oz (98.3 kg), SpO2 96 %. Body mass index is 30.24 kg/m.   Physical Exam:  Physical Exam  Constitutional:      Appearance: He is well-developed.  HENT:     Head: Normocephalic and atraumatic.     Right Ear: Tympanic membrane, ear canal and external ear normal.     Left Ear: Tympanic membrane, ear canal and external ear normal.     Nose: No nasal deformity, septal deviation, mucosal edema or rhinorrhea.     Right Turbinates: Enlarged and swollen.     Left Turbinates: Enlarged and swollen.     Right Sinus: No maxillary sinus tenderness or frontal sinus tenderness.     Left Sinus: No maxillary sinus tenderness or frontal sinus tenderness.     Mouth/Throat:     Mouth: Mucous membranes are not pale and not dry.     Pharynx: Uvula midline.  Eyes:     General:        Right eye: No discharge.        Left eye: No discharge.     Conjunctiva/sclera: Conjunctivae normal.     Right eye: Right conjunctiva is not injected. No chemosis.    Left eye: Left conjunctiva is not injected. No chemosis.    Pupils: Pupils are equal, round, and reactive to light.  Cardiovascular:     Rate and Rhythm: Normal rate and regular rhythm.     Heart sounds: Normal heart sounds.  Pulmonary:     Effort: Pulmonary effort is normal. No tachypnea, accessory muscle usage or respiratory distress.     Breath sounds: Examination of the right-middle field reveals  wheezing. Examination of the left-middle field reveals wheezing. Examination of the right-lower field reveals wheezing. Examination of the left-lower field reveals wheezing. Wheezing present. No rhonchi or rales.     Comments: Decreased air movement throughout.  Chest:     Chest wall: No tenderness.  Lymphadenopathy:     Cervical: No cervical adenopathy.  Skin:    Coloration: Skin is not pale.     Findings: No abrasion, erythema, petechiae or rash. Rash is not papular, urticarial or vesicular.  Neurological:     Mental Status: He is alert.      Diagnostic studies:     Spirometry: results abnormal (FEV1: 1.71/60%, FVC: 2.58/69%, FEV1/FVC: 66%).    Spirometry consistent with mixed obstructive and restrictive disease.   Allergy Studies: none      [name], MD  Allergy and [place] [facility] [place]

## 2021-03-07 NOTE — Patient Instructions (Addendum)
1. Severe persistent asthma with COPD overlap - We are going to decrease the prednisone to 5mg  today. - If you are interested in [place], we will work on getting it approved again.  - We will refer you to Pulmonology ONCE you get your new insurance card (BRING IT TO [name] WHEN YOU GET IT). - We can also submit for Tezspire (INFORMATION PROVIDED) once you get your new insurance card.  - Try adding a flutter valve TWICE DAILY after using your nebulizers to break up mucous and help it come up.     - Daily controller medication(s): Brovana (aformoterol) 54mcg THREE TIMES DAILY + Pulmicort (budesonide) 0.5 mg nebulizer treatment TWICE DAILY + Yupelri ONE TIME DAILY + montelukast 10 mg ONE TIME DAILY + prednisone 5mg  ONCE DAILY - Prior to physical activity: ProAir 2 puffs 10-15 minutes before physical activity. - Rescue medications: albuterol nebulizer one vial every 4-6 hours as needed or DuoNeb nebulizer one vial every 4-6 hours as needed - Asthma control goals:  * Full participation in all desired activities (may need albuterol before activity) * Albuterol use two time or less a week on average (not counting use with activity) * Cough interfering with sleep two time or less a month * Oral steroids no more  than once a year * No hospitalizations  2. Perennial and seasonal allergic rhinitis - Continue with loratadine 10mg  daily as needed. - Continue with fluticasone 1-2 sprays per nostril daily as needed.   3. GERD - Continue with pantoprazole 40mg  daily.  - Monitor Tums usage.   4. Stinging insect anaphylaxis (honeybee, hornets, yellow jacket, wasp) - We can certainly consider venom immunotherapy, but at this point I think we should hold off.  - I would like to get your breathing under better control first.   5. Return in about 6 months (around 09/07/2021).    Please inform us of any Emergency Department visits, hospitalizations, or changes in symptoms. Call us before going to the ED for  breathing or allergy symptoms since we might be able to fit you in for a sick visit. Feel free to contact us anytime with any questions, problems, or concerns.  It was a pleasure to talk to you today today!  Websites that have reliable patient information: 1. American Academy of Asthma, Allergy, and Immunology: www.aaaai.org 2. Food Allergy Research and Education (FARE): foodallergy.org 3. Mothers of Asthmatics: http://www.asthmacommunitynetwork.org 4. American College of Allergy, Asthma, and Immunology: www.acaai.org   COVID-19 Vaccine Information can be found at: ShippingScam.co.uk For questions related to vaccine distribution or appointments, please email [email or web address] or call [phone].     "Like" [name] on Facebook and Instagram for our latest updates!       Make sure you are registered to vote! If you have moved or changed any of your contact information, you will need to get this updated before voting!  In some cases, you MAY be able to register to vote online: [email or web address]

## 2021-03-08 ENCOUNTER — Encounter: Payer: Self-pay | Admitting: Allergy & Immunology

## 2021-03-08 MED ORDER — LORATADINE 10 MG PO TABS: 10.0000 mg | ORAL_TABLET | Freq: Every day | ORAL | 5 refills | Status: DC | PRN

## 2021-03-08 MED ORDER — MONTELUKAST SODIUM 10 MG PO TABS: 10.0000 mg | ORAL_TABLET | Freq: Every day | ORAL | 5 refills | Status: DC

## 2021-03-08 MED ORDER — PREDNISONE 5 MG PO TABS: 5.0000 mg | ORAL_TABLET | Freq: Every day | ORAL | 5 refills | Status: DC

## 2021-03-08 MED ORDER — FLUTICASONE PROPIONATE 50 MCG/ACT NA SUSP: NASAL | 5 refills | Status: DC

## 2021-03-08 MED ORDER — ALBUTEROL SULFATE HFA 108 (90 BASE) MCG/ACT IN AERS: 2.0000 | INHALATION_SPRAY | RESPIRATORY_TRACT | 1 refills | Status: DC | PRN

## 2021-03-09 DIAGNOSIS — J449 Chronic obstructive pulmonary disease, unspecified: Secondary | ICD-10-CM | POA: Diagnosis not present

## 2021-03-09 NOTE — Progress Notes (Signed)
Sounds like a plan do I need to call him? [name]

## 2021-03-20 ENCOUNTER — Telehealth: Payer: Self-pay

## 2021-03-20 NOTE — Telephone Encounter (Signed)
Patient brought his new insurance by and it is the same plan he had last year & some of this year that is considered out of network.  ([place]) I informed him we are out of network & have ran into a lot of problems with this plan covering some of his recent visits. He agreed to call the insurance to see how he can be seen at our office or have his planned switched. Patient states he will give [name] a call back.   Patient has requested I call [facility] to see if they will take his insurance for him to be seen.  I will check into this and call the patient back with an update.   Tammy I guess we will have to hold off on him starting Tezspire for now?   Thanks

## 2021-03-20 NOTE — Telephone Encounter (Signed)
Patient called back stating he does have a lot of Budesonide & Yupelri for the nebulizer if Dr Ernst Bowler wants him to do these instead of the [place].   Please Advise.

## 2021-03-20 NOTE — Telephone Encounter (Signed)
Patient stopped by stating he can not afford arformoterol (BROVANA) 15 MCG this month and he is wondering could we send in something else. Patient gave his new insurance card & it has been uploaded and put in epic.   [place]

## 2021-03-20 NOTE — Telephone Encounter (Signed)
Please advise on what medication can take. Patient now has [employer].

## 2021-03-21 NOTE — Telephone Encounter (Signed)
Sounds good - thanks for taking care of that!   [name], MD Allergy and [place] [facility] [place]

## 2021-03-21 NOTE — Telephone Encounter (Signed)
We could do Perforomist, but I think he is had some kind of reaction to that.  If the Garlon Hatchet is too expensive, we have no other option in the nebulized form.  Lets just continue with the Pulmicort and the Yupelri.  [name], MD Allergy and [place] [facility] [place]

## 2021-03-21 NOTE — Telephone Encounter (Signed)
NOTED

## 2021-03-21 NOTE — Telephone Encounter (Signed)
Called and spoke to patient and he stated that he indeed did have a reaction to the Perforomist and that he would stay on the [place] and Pulmicort. Patient states that he had to get a steroid shot yesterday at his PCP due to him wheezing and having some anxiety. Patient states that his wife was in surgery and he and his PCP believe that was the cause of his anxiety attack which caused his breathing to be off.

## 2021-03-24 ENCOUNTER — Telehealth: Payer: Self-pay | Admitting: Allergy & Immunology

## 2021-03-24 NOTE — Telephone Encounter (Signed)
Patient's wife called stating patient's [name] is costing too much. She stated it is about $600 and she would like to know if anything different could be sent in for patient.   [address] [place], [place]  Please advise.   Best contact number: [phone]

## 2021-03-24 NOTE — Telephone Encounter (Signed)
Dr. Ernst Bowler please advise:  Patient's wife called stating patient's Garlon Hatchet is costing too much. She stated it is about $600 and she would like to know if anything different could be sent in for patient.   [address] [place], [place]   Please advise.   Best contact number: [phone]

## 2021-03-26 NOTE — Telephone Encounter (Signed)
See note from 03/20/2021 - we are staying on the Pulmicort and Yupelri only.   [name], MD Allergy and [place] [facility] [place]

## 2021-03-27 ENCOUNTER — Other Ambulatory Visit: Payer: Self-pay | Admitting: *Deleted

## 2021-03-27 MED ORDER — BUDESONIDE 0.5 MG/2ML IN SUSP: 0.5000 mg | Freq: Two times a day (BID) | RESPIRATORY_TRACT | 2 refills | Status: DC

## 2021-03-27 NOTE — Telephone Encounter (Signed)
Called and spoke with the patient's wife and relayed Dr. Gillermina Hu message. She stated that he needed a refill of the Pulmicort medication. This has been refilled. Patient's wife verbalized understanding.

## 2021-04-10 NOTE — Telephone Encounter (Signed)
I spoke with [facility] on [address] she believes they will take his insurance. She requested I send over the referral for review to make sure. Referral has been placed.   Thanks

## 2021-04-11 ENCOUNTER — Emergency Department (HOSPITAL_COMMUNITY): Payer: Medicare (Managed Care)

## 2021-04-11 ENCOUNTER — Observation Stay (HOSPITAL_COMMUNITY)
Admission: EM | Admit: 2021-04-11 | Discharge: 2021-04-11 | Disposition: A | Discharge status: Home / Self Care | Payer: Medicare (Managed Care) | Attending: Internal Medicine | Admitting: Internal Medicine

## 2021-04-11 ENCOUNTER — Telehealth: Payer: Self-pay

## 2021-04-11 ENCOUNTER — Encounter (HOSPITAL_COMMUNITY): Payer: Self-pay | Admitting: Emergency Medicine

## 2021-04-11 DIAGNOSIS — R0602 Shortness of breath: Secondary | ICD-10-CM | POA: Diagnosis present

## 2021-04-11 DIAGNOSIS — Z85828 Personal history of other malignant neoplasm of skin: Secondary | ICD-10-CM | POA: Diagnosis not present

## 2021-04-11 DIAGNOSIS — Z85038 Personal history of other malignant neoplasm of large intestine: Secondary | ICD-10-CM | POA: Insufficient documentation

## 2021-04-11 DIAGNOSIS — Z87891 Personal history of nicotine dependence: Secondary | ICD-10-CM | POA: Diagnosis not present

## 2021-04-11 DIAGNOSIS — J441 Chronic obstructive pulmonary disease with (acute) exacerbation: Principal | ICD-10-CM | POA: Diagnosis present

## 2021-04-11 DIAGNOSIS — J45909 Unspecified asthma, uncomplicated: Secondary | ICD-10-CM | POA: Insufficient documentation

## 2021-04-11 DIAGNOSIS — E119 Type 2 diabetes mellitus without complications: Secondary | ICD-10-CM | POA: Insufficient documentation

## 2021-04-11 DIAGNOSIS — I10 Essential (primary) hypertension: Secondary | ICD-10-CM | POA: Insufficient documentation

## 2021-04-11 DIAGNOSIS — R079 Chest pain, unspecified: Secondary | ICD-10-CM

## 2021-04-11 DIAGNOSIS — Z8546 Personal history of malignant neoplasm of prostate: Secondary | ICD-10-CM | POA: Diagnosis not present

## 2021-04-11 DIAGNOSIS — Z8616 Personal history of COVID-19: Secondary | ICD-10-CM | POA: Diagnosis not present

## 2021-04-11 DIAGNOSIS — K5900 Constipation, unspecified: Secondary | ICD-10-CM

## 2021-04-11 DIAGNOSIS — Z79899 Other long term (current) drug therapy: Secondary | ICD-10-CM | POA: Insufficient documentation

## 2021-04-11 DIAGNOSIS — Z9981 Dependence on supplemental oxygen: Secondary | ICD-10-CM | POA: Diagnosis not present

## 2021-04-11 DIAGNOSIS — Z20822 Contact with and (suspected) exposure to covid-19: Secondary | ICD-10-CM | POA: Diagnosis not present

## 2021-04-11 LAB — I-STAT CHEM 8, ED
BUN: 13 mg/dL (ref 8–23)
Calcium, Ion: 1.12 mmol/L — ABNORMAL LOW (ref 1.15–1.40)
Chloride: 104 mmol/L (ref 98–111)
Creatinine, Ser: 1.1 mg/dL (ref 0.61–1.24)
Glucose, Bld: 192 mg/dL — ABNORMAL HIGH (ref 70–99)
HCT: 46 % (ref 39.0–52.0)
Hemoglobin: 15.6 g/dL (ref 13.0–17.0)
Potassium: 4.2 mmol/L (ref 3.5–5.1)
Sodium: 136 mmol/L (ref 135–145)
TCO2: 23 mmol/L (ref 22–32)

## 2021-04-11 LAB — CBC WITH DIFFERENTIAL/PLATELET
Abs Immature Granulocytes: 0.04 10*3/uL (ref 0.00–0.07)
Basophils Absolute: 0 10*3/uL (ref 0.0–0.1)
Basophils Relative: 1 %
Eosinophils Absolute: 0.2 10*3/uL (ref 0.0–0.5)
Eosinophils Relative: 3 %
HCT: 43.4 % (ref 39.0–52.0)
Hemoglobin: 15.1 g/dL (ref 13.0–17.0)
Immature Granulocytes: 1 %
Lymphocytes Relative: 15 %
Lymphs Abs: 1.1 10*3/uL (ref 0.7–4.0)
MCH: 33.5 pg (ref 26.0–34.0)
MCHC: 34.8 g/dL (ref 30.0–36.0)
MCV: 96.2 fL (ref 80.0–100.0)
Monocytes Absolute: 0.3 10*3/uL (ref 0.1–1.0)
Monocytes Relative: 4 %
Neutro Abs: 6 10*3/uL (ref 1.7–7.7)
Neutrophils Relative %: 76 %
Platelets: 253 10*3/uL (ref 150–400)
RBC: 4.51 MIL/uL (ref 4.22–5.81)
RDW: 12.4 % (ref 11.5–15.5)
WBC: 7.7 10*3/uL (ref 4.0–10.5)
nRBC: 0 % (ref 0.0–0.2)

## 2021-04-11 LAB — TROPONIN I (HIGH SENSITIVITY)
Troponin I (High Sensitivity): 7 ng/L (ref <18)
Troponin I (High Sensitivity): 6 ng/L (ref <18)

## 2021-04-11 LAB — COMPREHENSIVE METABOLIC PANEL
ALT: 25 U/L (ref 0–44)
AST: 23 U/L (ref 15–41)
Albumin: 3.7 g/dL (ref 3.5–5.0)
Alkaline Phosphatase: 53 U/L (ref 38–126)
Anion gap: 11 (ref 5–15)
BUN: 12 mg/dL (ref 8–23)
CO2: 25 mmol/L (ref 22–32)
Calcium: 9.7 mg/dL (ref 8.9–10.3)
Chloride: 101 mmol/L (ref 98–111)
Creatinine, Ser: 1.18 mg/dL (ref 0.61–1.24)
GFR, Estimated: >60 mL/min (ref >60)
Glucose, Bld: 130 mg/dL — ABNORMAL HIGH (ref 70–99)
Potassium: 4.1 mmol/L (ref 3.5–5.1)
Sodium: 137 mmol/L (ref 135–145)
Total Bilirubin: 0.5 mg/dL (ref 0.3–1.2)
Total Protein: 6.6 g/dL (ref 6.5–8.1)

## 2021-04-11 LAB — RESP PANEL BY RT-PCR (FLU A&B, COVID) ARPGX2
Influenza A by PCR: NEGATIVE
Influenza B by PCR: NEGATIVE
SARS Coronavirus 2 by RT PCR: NEGATIVE

## 2021-04-11 LAB — BRAIN NATRIURETIC PEPTIDE: B Natriuretic Peptide: 27.5 pg/mL (ref 0.0–100.0)

## 2021-04-11 LAB — HIV ANTIBODY (ROUTINE TESTING W REFLEX): HIV Screen 4th Generation wRfx: NONREACTIVE

## 2021-04-11 MED ORDER — MAGNESIUM CITRATE PO SOLN: 1.0000 | Freq: Once | ORAL | 0 refills | Status: AC

## 2021-04-11 MED ORDER — ALBUTEROL SULFATE (2.5 MG/3ML) 0.083% IN NEBU
5.0000 mg | INHALATION_SOLUTION | Freq: Once | RESPIRATORY_TRACT | Status: AC
  Administered 2021-04-11: 5 mg via RESPIRATORY_TRACT
  Filled 2021-04-11: qty 6

## 2021-04-11 MED ORDER — AZITHROMYCIN 250 MG PO TABS: 250.0000 mg | ORAL_TABLET | Freq: Every day | ORAL | Status: DC

## 2021-04-11 MED ORDER — MINERAL OIL RE ENEM
1.0000 | ENEMA | Freq: Once | RECTAL | Status: DC
  Filled 2021-04-11 (×2): qty 1

## 2021-04-11 MED ORDER — PREDNISONE 20 MG PO TABS: 40.0000 mg | ORAL_TABLET | Freq: Every day | ORAL | Status: DC

## 2021-04-11 MED ORDER — PREDNISONE 20 MG PO TABS: ORAL_TABLET | ORAL | 0 refills | Status: DC

## 2021-04-11 MED ORDER — ONDANSETRON HCL 4 MG/2ML IJ SOLN
4.0000 mg | Freq: Once | INTRAMUSCULAR | Status: AC
  Administered 2021-04-11: 4 mg via INTRAVENOUS
  Filled 2021-04-11: qty 2

## 2021-04-11 MED ORDER — IPRATROPIUM-ALBUTEROL 0.5-2.5 (3) MG/3ML IN SOLN
3.0000 mL | Freq: Four times a day (QID) | RESPIRATORY_TRACT | Status: DC
  Administered 2021-04-11: 3 mL via RESPIRATORY_TRACT
  Filled 2021-04-11 (×2): qty 3

## 2021-04-11 MED ORDER — DOCUSATE SODIUM 100 MG PO CAPS: 100.0000 mg | ORAL_CAPSULE | Freq: Two times a day (BID) | ORAL | 0 refills | Status: DC

## 2021-04-11 MED ORDER — ALBUTEROL (5 MG/ML) CONTINUOUS INHALATION SOLN
10.0000 mg/h | INHALATION_SOLUTION | Freq: Once | RESPIRATORY_TRACT | Status: AC
  Administered 2021-04-11: 10 mg/h via RESPIRATORY_TRACT
  Filled 2021-04-11: qty 20

## 2021-04-11 MED ORDER — POLYETHYLENE GLYCOL 3350 17 G PO PACK: 17.0000 g | PACK | Freq: Every day | ORAL | 0 refills | Status: DC

## 2021-04-11 MED ORDER — METHYLPREDNISOLONE SODIUM SUCC 125 MG IJ SOLR
60.0000 mg | Freq: Two times a day (BID) | INTRAMUSCULAR | Status: DC
  Administered 2021-04-11: 60 mg via INTRAVENOUS
  Filled 2021-04-11: qty 2

## 2021-04-11 MED ORDER — ALBUTEROL SULFATE (2.5 MG/3ML) 0.083% IN NEBU
INHALATION_SOLUTION | RESPIRATORY_TRACT | Status: AC
  Filled 2021-04-11: qty 3

## 2021-04-11 MED ORDER — AZITHROMYCIN 250 MG PO TABS: 500.0000 mg | ORAL_TABLET | Freq: Every day | ORAL | Status: DC

## 2021-04-11 NOTE — ED Provider Notes (Signed)
I discussed patient with Dr. Lorin Mercy, who has consulted on patient.  The patient now has no dyspnea and is on his chronic home oxygen level.  He is also complaining of chronic constipation and I will adjust some of his meds for this.  However from a breathing standpoint, he is dramatically improved and it sounds like he had a COPD exacerbation.  He will be given steroid burst.  There is no obvious infection.  Labs and chest x-ray and abdominal x-ray are overall benign.  Discharged home with return precautions.   Sherwood Gambler, MD 04/11/21 [phone]

## 2021-04-11 NOTE — Telephone Encounter (Signed)
Patient called to let Dr Ernst Bowler know he was in the [facility] ED earlier today but he is doing better. He would like Dr Ernst Bowler to review the notes and give him a call if he has any questions for him.   Thanks

## 2021-04-11 NOTE — Telephone Encounter (Signed)
Thanks for the heads up.  Everything looks good from my end.  Is he still thinking Dupixent?  [name], MD Allergy and [place] [facility] [place]

## 2021-04-11 NOTE — Consult Note (Signed)
ER Consult Note   [name] ZLD:[id number] DOB: 03-Jul-1951 DOA: 04/11/2021  PCP: [place] [place] Consultants:  Ernst Bowler - allergy/asthma; Le - GI; palliative care Patient coming from:  Home - lives with wife and mother-in-law; Donald Prose: Wife, [phone]  Chief Complaint: SOB  HPI: [name] is a 70 y.o. male with medical history significant of COPD on 2L home O2; DM;  depression/anxiety; HTN; ETOH dependence; OSA; schizophrenia; and prostate CA presenting with SOB.  He reports that he "has asthma" and stage 4 emphysema but he has been unable to see a pulmonologist.  Dr. Ernst Bowler said it is time for him to get a pulmonologist.  He takes treatments daily and wheezes regularly.  A couple of days ago he started wheezing more.  He wears O2 if the breathing treatments don't help.  It has been up and down but last night it was really bad.  About 0100 he woke up to the bathroom and he laid down and had severe pain in both arms and his chest twitched.  He got up and could not breathe, couldn't get air in or out.  He tried pursed lip breathing.  He thought he was going to die.  He is now better after continuous neb.  No fevers.      ED Course: [place], per Dr. Nevada Crane:  70 year old male history of COPD presents with sudden onset shortness of breath after dusting and chest pain.  Troponin negative.  Work-up in the ED revealed COPD exacerbation for which he received IV steroids and nebulizer treatment.   Review of Systems: As per HPI; otherwise review of systems reviewed and negative.   Ambulatory Status:  Ambulates without assistance  COVID Vaccine Status:   Complete - J&J  Past Medical History:  Diagnosis Date   Anxiety disorder    Arthritis    BILATERAL SHOULDERS, ELBOWS AND HANDS AND LEFT HIP AND KNEES--HX CORTISONE SHOTS IN SHOULDERS, ELBOWS, HIP AND KNEES   Asthma    Basal cell carcinoma of back 04/30/2016   Dermatologist- Dr Nevada Crane;   MOHS sx- Dr Levada Dy     Bladder outlet obstruction    Chronic idiopathic constipation 07/62/2633   Complication of anesthesia    DIFFICULT WAKING    COPD (chronic obstructive pulmonary disease) [place] Frequent exacerbations 01/13/2014   [name] 07/08/14: FeV1 51% FeV1/FVC 66% FVC 59% 10/5/2015ONO RA was normal  10/13/2014  ONO on RA NORMAL    Depression    Diabetes mellitus without complication ([place])    BODERLINE - DIET CONTROL   Former smoker 01/13/2014   Gastroesophageal reflux disease without esophagitis    Heavy alcohol use 04/30/2016   History of oxygen administration    oxygen use 2 l/m nasally at bedtime and exertional occasions   History of rheumatic fever    Hx of multiple concussions    x 2 per patient    Hypertension    Melanoma ([place])    Nocturnal oxygen desaturation    USES O2 NIGHTLY   OSA (obstructive sleep apnea) 01/13/2014   PONV (postoperative nausea and vomiting)    Post-polio syndrome    polio at age 65--PT WAS IN IRON LUNG; PT WAS IN W/C UNTIL AGE 66; STILL HAS WEAKNESS RIGHT SIDE   Prostate cancer ([place])    Schizophrenia ([place])    Tuberculosis 1984   Hosp 4 months rx , left early    Urticaria     Past Surgical History:  Procedure Laterality Date  ADENOIDECTOMY     CARDIOVASCULAR STRESS TEST  06-08-2014  dr Mare Ferrari   normal lexiscan study/  no ischemia/  not gated due to PAC's   COLONOSCOPY N/A 05/03/2015   Procedure: COLONOSCOPY;  Surgeon: [name], MD;  Location: [facility];  Service: Endoscopy;  Laterality: N/A;   CYSTOSCOPY N/A 10/25/2015   Procedure: CYSTOSCOPY;  Surgeon: [name], MD;  Location: [facility];  Service: Urology;  Laterality: N/A;   CYSTOSCOPY W/ CYSTOGRAM/  TRANSRECTAL ULTRASOUND PROSTATE BX  03-22-2009   ESOPHAGOGASTRODUODENOSCOPY N/A 03/22/2015   Procedure: ESOPHAGOGASTRODUODENOSCOPY (EGD) with dilation;  Surgeon: [name], MD;  Location: [facility];  Service: Endoscopy;  Laterality: N/A;   excision of skin lesion     LAPAROSCOPIC CHOLECYSTECTOMY   2013   left elbow surgery      due to fracture    NASAL SEPTUM SURGERY  2000   OTHER SURGICAL HISTORY      Muscle & bone Graft/Polio   polio surgeries      14 polio surgeries    PROSTATE BIOPSY N/A 09/28/2014   Procedure: PROSTATE ULTRASOUND/BIOPSY;  Surgeon: [name], MD;  Location: [facility];  Service: Urology;  Laterality: N/A;   PROSTATE BIOPSY N/A 10/25/2015   Procedure: PROSTATE BIOPSY AND ULTRASOUND;  Surgeon: [name], MD;  Location: [facility];  Service: Urology;  Laterality: N/A;   SAVORY DILATION N/A 03/22/2015   Procedure: SAVORY DILATION;  Surgeon: [name], MD;  Location: [facility];  Service: Endoscopy;  Laterality: N/A;   SHOULDER ARTHROSCOPY WITH OPEN ROTATOR CUFF REPAIR Bilateral 2013  &  1999   removal spurs and bursectomy   TONSILLECTOMY     TRANSURETHRAL INCISION OF BLADDER NECK N/A 10/25/2015   Procedure:  TRANSURETHRAL INCISION OF BLADDER NECK;  Surgeon: [name], MD;  Location: [facility];  Service: Urology;  Laterality: N/A;   TRANSURETHRAL RESECTION OF PROSTATE N/A 09/28/2014   Procedure: TRANSURETHRAL RESECTION OF THE PROSTATE (TURP);  Surgeon: [name], MD;  [facility]: [facility];  Service: Urology;  Laterality: N/A;   URETEROSOPY STONE EXTRACTION  2000    Social History   Socioeconomic History   Marital status: Married    Spouse name: Not on file   Number of children: Not on file   Years of education: Not on file   Highest education level: Not on file  Occupational History   Occupation: Retired  Tobacco Use   Smoking status: Former    Pack years: 0.00    Types: Pipe    Quit date: 08/25/2013    Years since quitting: 7.6   Smokeless tobacco: Former    Types: Chew   Tobacco comments:    5-6 times smoking a pipe  Vaping Use   Vaping Use: Never used  Substance and Sexual Activity   Alcohol use: Not Currently    Comment: 4 cans of beer daily   Drug use: No   Sexual activity: Yes    Birth control/protection: None  Other Topics Concern   Not on  file  Social History Narrative   Not on file   Social Determinants of Health   Financial Resource Strain: Not on file  Food Insecurity: Not on file  Transportation Needs: Not on file  Physical Activity: Not on file  Stress: Not on file  Social Connections: Not on file  Intimate Partner Violence: Not on file    Allergies  Allergen Reactions   Aspirin Anaphylaxis   [place] Venom Anaphylaxis   Ivp  Dye [Iodinated Diagnostic Agents] Anaphylaxis   Levaquin [Levofloxacin In D5w] Shortness Of Breath and Swelling    In addition: sweating, chest pain, and diarrhea.    Nsaids Anaphylaxis   Penicillins Anaphylaxis    Heart stops Has patient had a PCN reaction causing immediate rash, facial/tongue/throat swelling, SOB or lightheadedness with hypotension: yes Has patient had a PCN reaction causing severe rash involving mucus membranes or skin necrosis: NO Has patient had a PCN reaction that required hospitalization yes Has patient had a PCN reaction occurring within the last 10 years: No If all of the above answers are "NO", then may proceed with Cephalosporin use.    Tolmetin Anaphylaxis   Fasenra [Benralizumab] Hives   Perforomist [Formoterol]     Increased wheezing, shortness of breath   Buprenorphine Hcl Nausea And Vomiting    Can take with zofran    Morphine And Related Nausea And Vomiting    Can take with zofran    Oxycodone Itching and Rash    Family History  Problem Relation Age of Onset   Alzheimer's disease Father 4       Deceased   Stomach cancer Father    Heart attack Father    Heart disease Father    Skin cancer Mother        Facial-Living   Alcohol abuse Sister        x2   Mental illness Sister        x2   Alcohol abuse Sister    Diabetes Maternal Aunt        x2   Thyroid disease Maternal Aunt        x4   Diabetes Maternal Uncle    Tuberculosis Paternal Grandfather    Tuberculosis Paternal Grandmother    Alzheimer's disease Paternal Aunt    Alzheimer's  disease Paternal Uncle    Colon cancer Neg Hx    Colon polyps Neg Hx    Crohn's disease Neg Hx    Ulcerative colitis Neg Hx    Allergic rhinitis Neg Hx    Angioedema Neg Hx    Asthma Neg Hx    Eczema Neg Hx    Urticaria Neg Hx     Prior to Admission medications   Medication Sig Start Date End Date Taking? Authorizing Provider  albuterol (PROAIR HFA) 108 (90 Base) MCG/ACT inhaler Inhale 2 puffs into the lungs every 4 (four) hours as needed for wheezing or shortness of breath. 03/08/21  Yes Valentina Shaggy, MD  albuterol (PROVENTIL) (2.5 MG/3ML) 0.083% nebulizer solution Take 3 mLs (2.5 mg total) by nebulization every 6 (six) hours as needed for wheezing or shortness of breath. 02/15/21  Yes Valentina Shaggy, MD  atorvastatin (LIPITOR) 20 MG tablet Take 20 mg by mouth daily. 03/30/21  Yes [provider]  baclofen (LIORESAL) 10 MG tablet Take 10 mg by mouth daily as needed for muscle spasms. 08/25/19  Yes [provider]  diphenhydrAMINE (SOMINEX) 25 MG tablet Take 25 mg by mouth at bedtime as needed for itching.   Yes [provider]  EPINEPHrine (EPIPEN 2-PAK) 0.3 mg/0.3 mL IJ SOAJ injection Inject 0.3 mLs (0.3 mg total) into the muscle as needed for anaphylaxis. 07/03/19  Yes Valentina Shaggy, MD  fluticasone [facility]) 50 MCG/ACT nasal spray 1-2 sprays per nostril daily as needed Patient taking differently: Place 1 spray into both nostrils daily as needed for allergies. 03/08/21  Yes Valentina Shaggy, MD  ipratropium-albuterol (DUONEB) 0.5-2.5 (3) MG/3ML SOLN Take  3 mLs by nebulization every evening. Patient taking differently: Take 3 mLs by nebulization in the morning and at bedtime. 01/21/20  Yes Valentina Shaggy, MD  lactulose (CHRONULAC) 10 GM/15ML solution Take 30 mLs by mouth 3 (three) times daily as needed for mild constipation. 01/16/21  Yes [provider]  Lidocaine 5 % CREA Apply to perianal area as needed for pain. Patient  taking differently: Place 1 application around the anus daily as needed (pain). 10/09/20  Yes Molpus, John, MD  loratadine (CLARITIN) 10 MG tablet Take 1 tablet (10 mg total) by mouth daily as needed for allergies. 03/08/21  Yes Valentina Shaggy, MD  nitroGLYCERIN (NITROSTAT) 0.4 MG SL tablet Place 1 tablet (0.4 mg total) under the tongue every 5 (five) minutes as needed for chest pain. 06/21/15  Yes Brunetta Jeans, PA-C  ondansetron (ZOFRAN ODT) 4 MG disintegrating tablet Take 1 tablet (4 mg total) by mouth every 8 (eight) hours as needed for nausea or vomiting. 07/06/20  Yes Valentina Shaggy, MD  OXYGEN Inhale 3-4 L into the lungs at bedtime. DURING THE DAY IF NEEDED Reported on 11/25/2015   Yes [provider]  predniSONE (DELTASONE) 5 MG tablet Take 1 tablet (5 mg total) by mouth daily. 03/08/21  Yes Valentina Shaggy, MD  Respiratory Therapy Supplies (NEBULIZER/TUBING/MOUTHPIECE) KIT 1 each by Does not apply route as directed. 07/24/19  Yes Valentina Shaggy, MD  Sodium Phosphates (ENEMA RE) Place 1 enema rectally daily as needed (constipation).   Yes [provider]  arformoterol (BROVANA) 15 MCG/2ML NEBU Take 2 mLs (15 mcg total) by nebulization 2 (two) times daily. Patient not taking: No sig reported 11/03/20   Valentina Shaggy, MD  budesonide (PULMICORT) 0.5 MG/2ML nebulizer solution Take 2 mLs (0.5 mg total) by nebulization 2 (two) times daily. Patient not taking: No sig reported 03/27/21   Valentina Shaggy, MD  cetirizine (ZYRTEC) 10 MG tablet Take 1 tablet (10 mg total) by mouth 2 (two) times daily. Patient not taking: No sig reported 10/06/20   Valentina Shaggy, MD  montelukast (SINGULAIR) 10 MG tablet Take 1 tablet (10 mg total) by mouth at bedtime. Patient not taking: No sig reported 03/08/21   Valentina Shaggy, MD  predniSONE (DELTASONE) 10 MG tablet Take 1 tablet twice a day for four days before your injection, Then take 1 tablet  twice a day for four days after your injection, then stop. Patient not taking: No sig reported 10/06/20   Valentina Shaggy, MD    Physical Exam: Vitals:   04/11/21 0545 04/11/21 0720 04/11/21 0800 04/11/21 0825  BP: (!) 168/112 (!) 142/73 (!) 131/58   Pulse: (!) 110 97 (!) 112   Resp: 19 (!) 26 18   Temp:    98.4 F (36.9 C)  TempSrc:    Oral  SpO2: 95% 96% 97%   Weight:      Height:         General:  Appears calm and comfortable and is in NAD, sitting up in bedside chair on home O2 Eyes:  EOMI, normal lids, iris ENT:  grossly normal hearing, lips & tongue, mmm Neck:  no LAD, masses or thyromegaly Cardiovascular:  RRR, no m/r/g. No LE edema.  Respiratory:   CTA bilaterally with mild scattered wheezing. Normal to mildly increased respiratory effort. Abdomen:  soft, NT, ND Skin:  no rash or induration seen on limited exam Musculoskeletal:  grossly normal tone BUE/BLE, good ROM, no bony  abnormality Psychiatric:  grossly normal mood and affect, speech fluent and appropriate, AOx3 Neurologic:  CN 2-12 grossly intact, moves all extremities in coordinated fashion    Radiological Exams on Admission: Independently reviewed - see discussion in A/P where applicable  DG Abdomen 1 View  Result Date: 04/11/2021 CLINICAL DATA:  Constipation. EXAM: ABDOMEN - 1 VIEW COMPARISON:  CT 10/09/2020. FINDINGS: Surgical clips right upper quadrant. Moderate stool volume. No bowel distention. No free air. Degenerative change thoracolumbar spine and both hips. IMPRESSION: Moderate stool volume. No bowel distention. No acute abnormality identified. Electronically Signed   By: Marcello Moores  Register   On: 04/11/2021 06:18   DG Chest Portable 1 View  Result Date: 04/11/2021 CLINICAL DATA:  Dyspnea, nausea EXAM: PORTABLE CHEST 1 VIEW COMPARISON:  08/16/2020 FINDINGS: The heart size and mediastinal contours are within normal limits. Both lungs are clear. The visualized skeletal structures are unremarkable.  IMPRESSION: No active disease. Electronically Signed   By: Fidela Salisbury MD   On: 04/11/2021 02:35    EKG: Independently reviewed.  NSR with rate 78; no evidence of acute ischemia   Labs on Admission: I have personally reviewed the available labs and imaging studies at the time of the admission.  Pertinent labs:   Glucose 130, 192 BNP 27.5 HS troponin 7, 6 Normal CBC COVID/flu negative   Assessment/Plan Principal Problem:   Acute exacerbation of chronic obstructive pulmonary disease (COPD) (HCC) Active Problems:   Constipation   Acute on chronic respiratory failure associated with a COPD flare -Patient's shortness of breath overnight likely associated with COPD flare -He wears 3-4L home O2 and is currently back on this level -He does not have fever or leukocytosis.  -Chest x-ray is not consistent with pneumonia -He was given a continuous neb treatment in the ED with marked improvement and is now requesting to go home. -will observe for now and attempt to wean off [place] O2 if possible -will admit patient - with her failure of outpatient therapy and markedly decreased O2 sats (into the 60-70s), it seems likely that she will need several days of hospitalization to show sufficient improvement for discharge. -Nebulizers: Duonebs and prn albuterol -Consider short burst of steroids -He does not appear to need antibiotics at this time -Patient appears to be stable for outpatient management at this time  Constipation -Resume home lactulose, enemas as needed      Note: This patient has been tested and is negative for the novel coronavirus COVID-19. He has been vaccinated against COVID-19 with 1 dose of [place].    Thank you for this interesting consult.  Based on current patient stability, he appears to be appropriate for d/c to home with short-term outpatient f/u at this time.     Karmen Bongo MD [facility]   How to contact the [facility] Attending or  Consulting provider [place] or covering provider during after hours [place], for this patient?  Check the care team in [facility] and look for a) attending/consulting [facility] provider listed and b) the [facility] team listed Log into [email or web address] and use [place]'s universal password to access. If you do not have the password, please contact the hospital operator. Locate the [facility] provider you are looking for under [facility] and page to a number that you can be directly reached. If you still have difficulty reaching the provider, please page the [facility] (Director on Call) for the Hospitalists listed on amion for assistance.   04/11/2021, 11:44 AM

## 2021-04-11 NOTE — Discharge Instructions (Addendum)
If you develop new or worsening shortness of breath, coughing up blood, fever, abdominal pain, chest pain, or any other new/concerning symptoms then return to the ER for evaluation.

## 2021-04-11 NOTE — ED Provider Notes (Signed)
[facility] EMERGENCY DEPARTMENT Provider Note   CSN: [id number] Arrival date & time: 04/11/21  0209     History Chief Complaint  Patient presents with   Shortness of Breath   Chest Pain    [name] is a 70 y.o. male.  The history is provided by the patient.  Shortness of Breath Severity:  Severe Onset quality:  Sudden Duration:  2 hours Timing:  Constant Progression:  Unchanged Chronicity:  Recurrent Context comment:  Dust in the garage earlier Relieved by:  Nothing Worsened by:  Nothing Ineffective treatments: was given nebs x 2 solumedrol and magnesium en route. Associated symptoms: chest pain, vomiting and wheezing   Associated symptoms: no diaphoresis, no fever and no rash   Chest pain:    Quality: dull     Severity:  Moderate   Onset quality:  Gradual   Duration: 15 minutes or so.   Timing:  Constant   Progression:  Resolved   Chronicity:  New Risk factors: no recent surgery   Chest Pain Pain location:  Substernal area Pain radiates to:  L arm and R arm Pain severity:  Moderate Onset quality:  Sudden Duration:  15 minutes Timing:  Constant Progression:  Resolved Chronicity:  Recurrent Context: breathing and at rest   Relieved by:  Nothing Worsened by:  Nothing Ineffective treatments:  None tried Associated symptoms: shortness of breath and vomiting   Associated symptoms: no diaphoresis, no dizziness and no fever   Risk factors: male sex   Risk factors: no aortic disease and no smoking       Past Medical History:  Diagnosis Date   Anxiety disorder    Arthritis    BILATERAL SHOULDERS, ELBOWS AND HANDS AND LEFT HIP AND KNEES--HX CORTISONE SHOTS IN SHOULDERS, ELBOWS, HIP AND KNEES   Asthma    Basal cell carcinoma of back 04/30/2016   Dermatologist- Dr Nevada Crane;   MOHS sx- Dr Levada Dy    Bladder outlet obstruction    Chronic idiopathic constipation 2/70/3500   Complication of anesthesia    DIFFICULT WAKING    COPD (chronic  obstructive pulmonary disease) [place] C Frequent exacerbations 01/13/2014   [name] 07/08/14: FeV1 51% FeV1/FVC 66% FVC 59% 10/5/2015ONO RA was normal  10/13/2014  ONO on RA NORMAL    COPD, frequent exacerbations ([place])    pulmologist-  dr Joya Gaskins--  [place] Rm Stage C.04-25-15 recent COPD exacerbation-much improved now, after tx. in ER [facility].   Depression    Diabetes mellitus without complication ([place])    BODERLINE - DIET CONTROL   Dysrhythmia    PVC'S   Emphysema lung ([place])    stage 2   Family history of adverse reaction to anesthesia    father would wake up with agitation    Former smoker 01/13/2014   Gastroesophageal reflux disease without esophagitis    Heavy alcohol use 04/30/2016   History of chronic bronchitis    History of oxygen administration    oxygen use 2 l/m nasally at bedtime and exertional occasions   History of rheumatic fever    History of TB (tuberculosis)    1984--  hospitalized for 4 month treatment   History of urinary retention    Hx of multiple concussions    x 2 per patient    Hypertension    Melanoma ([place])    Nocturnal oxygen desaturation    USES O2 NIGHTLY   OSA (obstructive sleep apnea) 01/13/2014   PONV (postoperative nausea and vomiting)  Post-polio syndrome    polio at age 64--PT WAS IN IRON LUNG; PT WAS IN W/C UNTIL AGE 67; STILL HAS WEAKNESS RIGHT SIDE   Prostate cancer ([place])    Schizophrenia ([place])    Tuberculosis    Hosp 4 months rx , left early    Urticaria     Patient Active Problem List   Diagnosis Date Noted   Pneumonia due to COVID-19 virus 07/27/2020   COPD with acute exacerbation ([place]) 12/04/2018   PSVT (paroxysmal supraventricular tachycardia) ([place]) 09/23/2018   Seasonal and perennial allergic rhinitis 07/30/2018   Asthma-COPD overlap syndrome ([place]) 07/07/2018   S/P arthroscopy of right shoulder 04/25/2017   Generalized anxiety disorder 04/22/2017   Severe persistent asthma without complication 55/73/2202   Bee sting-induced anaphylaxis  02/12/2017   Anaphylactic reaction due to food, initial encounter 02/12/2017   Current use of beta blocker 02/12/2017   Allergic rhinitis 01/15/2017   COPD exacerbation ([place]) 11/04/2016   Asthma exacerbation 11/03/2016   Nausea without vomiting 09/17/2016   Shortness of breath    Diabetes mellitus type 2 in obese ([place]) 06/26/2016   non-specific Chest pain 06/12/2016   Patient's noncompliance with other medical treatment and regimen 06/02/2016   Hyperlipidemia, mixed 06/02/2016   Elevated liver enzymes  06/02/2016   Schizophrenia ([place]) 04/30/2016   Environmental and seasonal allergies 04/30/2016   Diabetes mellitus without complication- (diet controlled; w/o proteinuria) 04/30/2016   Heavy alcohol use- Many yrs  04/30/2016   Basal cell carcinoma of back 04/30/2016   Obesity 04/30/2016   Hx of multiple concussions 04/30/2016   h/o Vitamin D deficiency 04/30/2016   Screen for sexually transmitted diseases 04/30/2016   Immunocompromised state ([place]) 04/30/2016   Cervical radiculopathy 01/15/2016   Alcohol withdrawal ([place]) 11/22/2015   Alcohol abuse with alcohol-induced mood disorder ([place]) 11/22/2015   h/o Suicidal thoughts 11/21/2015   Dry mouth 10/07/2015   Anxiety 09/19/2015   OAB (overactive bladder) 07/27/2015   Chronic idiopathic constipation 05/08/2015   Screening for colon cancer    Benign neoplasm of cecum    Benign neoplasm of ascending colon    Benign neoplasm of transverse colon    Benign neoplasm of sigmoid colon    Rectal polyp    Gastroesophageal reflux disease without esophagitis    Esophageal stricture    Joint stiffness of multiple sites 10/17/2014   Benign localized hyperplasia of prostate with urinary obstruction 09/29/2014   concerns for memory loss 09/13/2014   Tuberculosis    Diverticulum of bladder 06/17/2014   Acute urinary retention 05/31/2014   Neuropathy ([place]) 04/11/2014   Post-polio syndrome 01/13/2014   Other emphysema ([place]) 01/13/2014    Hypertension 01/13/2014   Former smoker 01/13/2014   h/o Prostate cancer 01/13/2014   History of tuberculosis 01/13/2014   Bipolar disorder ([place]) 01/13/2014   Explosive personality disorder ([place]) 01/13/2014   Erectile dysfunction 01/13/2014   Nephrolithiasis 01/13/2014   Chronic pain 01/13/2014   Obstructive sleep apnea 54/27/0623   Alcoholic gastritis 76/28/3151   Nocturnal hypoxia 08/10/2013    Past Surgical History:  Procedure Laterality Date   ADENOIDECTOMY     CARDIOVASCULAR STRESS TEST  06-08-2014  dr Mare Ferrari   normal lexiscan study/  no ischemia/  not gated due to PAC's   COLONOSCOPY N/A 05/03/2015   Procedure: COLONOSCOPY;  Surgeon: [name], MD;  Location: [facility];  Service: Endoscopy;  Laterality: N/A;   CYSTOSCOPY N/A 10/25/2015   Procedure: CYSTOSCOPY;  Surgeon: [name], MD;  Location: [facility];  Service: Urology;  Laterality: N/A;   CYSTOSCOPY W/ CYSTOGRAM/  TRANSRECTAL ULTRASOUND PROSTATE BX  03-22-2009   ESOPHAGOGASTRODUODENOSCOPY N/A 03/22/2015   Procedure: ESOPHAGOGASTRODUODENOSCOPY (EGD) with dilation;  Surgeon: [name], MD;  Location: [facility];  Service: Endoscopy;  Laterality: N/A;   excision of skin lesion     LAPAROSCOPIC CHOLECYSTECTOMY  2013   left elbow surgery      due to fracture    NASAL SEPTUM SURGERY  2000   OTHER SURGICAL HISTORY      Muscle & bone Graft/Polio   polio surgeries      14 polio surgeries    PROSTATE BIOPSY N/A 09/28/2014   Procedure: PROSTATE ULTRASOUND/BIOPSY;  Surgeon: [name], MD;  Location: [facility];  Service: Urology;  Laterality: N/A;   PROSTATE BIOPSY N/A 10/25/2015   Procedure: PROSTATE BIOPSY AND ULTRASOUND;  Surgeon: [name], MD;  Location: [facility];  Service: Urology;  Laterality: N/A;   SAVORY DILATION N/A 03/22/2015   Procedure: SAVORY DILATION;  Surgeon: [name], MD;  Location: [facility];  Service: Endoscopy;  Laterality: N/A;   SHOULDER ARTHROSCOPY WITH OPEN ROTATOR CUFF REPAIR Bilateral  2013  &  1999   removal spurs and bursectomy   TONSILLECTOMY     TRANSURETHRAL INCISION OF BLADDER NECK N/A 10/25/2015   Procedure:  TRANSURETHRAL INCISION OF BLADDER NECK;  Surgeon: [name], MD;  Location: [facility];  Service: Urology;  Laterality: N/A;   TRANSURETHRAL RESECTION OF PROSTATE N/A 09/28/2014   Procedure: TRANSURETHRAL RESECTION OF THE PROSTATE (TURP);  Surgeon: [name], MD;  Location: [facility];  Service: Urology;  Laterality: N/A;   URETEROSOPY STONE EXTRACTION  2000       Family History  Problem Relation Age of Onset   Alzheimer's disease Father 4       Deceased   Stomach cancer Father    Heart attack Father    Heart disease Father    Skin cancer Mother        Facial-Living   Alcohol abuse Sister        x2   Mental illness Sister        x2   Alcohol abuse Sister    Diabetes Maternal Aunt        x2   Thyroid disease Maternal Aunt        x4   Diabetes Maternal Uncle    Tuberculosis Paternal Grandfather    Tuberculosis Paternal Grandmother    Alzheimer's disease Paternal Aunt    Alzheimer's disease Paternal Uncle    Colon cancer Neg Hx    Colon polyps Neg Hx    Crohn's disease Neg Hx    Ulcerative colitis Neg Hx    Allergic rhinitis Neg Hx    Angioedema Neg Hx    Asthma Neg Hx    Eczema Neg Hx    Urticaria Neg Hx     Social History   Tobacco Use   Smoking status: Former    Pack years: 0.00    Types: Pipe    Quit date: 08/25/2013    Years since quitting: 7.6   Smokeless tobacco: Former    Types: Chew   Tobacco comments:    5-6 times smoking a pipe  Vaping Use   Vaping Use: Never used  Substance Use Topics   Alcohol use: Yes    Alcohol/week: 14.0 standard drinks    Types: 14 Cans of beer per week    Comment: 2 cans daily  Drug use: No    Home Medications Prior to Admission medications   Medication Sig Start Date End Date Taking? Authorizing Provider  albuterol (PROAIR HFA) 108 (90 Base) MCG/ACT inhaler Inhale 2 puffs into the lungs  every 4 (four) hours as needed for wheezing or shortness of breath. 03/08/21  Yes Valentina Shaggy, MD  albuterol (PROVENTIL) (2.5 MG/3ML) 0.083% nebulizer solution Take 3 mLs (2.5 mg total) by nebulization every 6 (six) hours as needed for wheezing or shortness of breath. 02/15/21  Yes Valentina Shaggy, MD  atorvastatin (LIPITOR) 20 MG tablet Take 20 mg by mouth daily. 03/30/21  Yes [provider]  baclofen (LIORESAL) 10 MG tablet Take 10 mg by mouth daily as needed for muscle spasms. 08/25/19  Yes [provider]  diphenhydrAMINE (SOMINEX) 25 MG tablet Take 25 mg by mouth at bedtime as needed for itching.   Yes [provider]  EPINEPHrine (EPIPEN 2-PAK) 0.3 mg/0.3 mL IJ SOAJ injection Inject 0.3 mLs (0.3 mg total) into the muscle as needed for anaphylaxis. 07/03/19  Yes Valentina Shaggy, MD  fluticasone [facility]) 50 MCG/ACT nasal spray 1-2 sprays per nostril daily as needed Patient taking differently: Place 1 spray into both nostrils daily as needed for allergies. 03/08/21  Yes Valentina Shaggy, MD  ipratropium-albuterol (DUONEB) 0.5-2.5 (3) MG/3ML SOLN Take 3 mLs by nebulization every evening. Patient taking differently: Take 3 mLs by nebulization in the morning and at bedtime. 01/21/20  Yes Valentina Shaggy, MD  lactulose (CHRONULAC) 10 GM/15ML solution Take 30 mLs by mouth 3 (three) times daily as needed for mild constipation. 01/16/21  Yes [provider]  Lidocaine 5 % CREA Apply to perianal area as needed for pain. Patient taking differently: Place 1 application around the anus daily as needed (pain). 10/09/20  Yes Molpus, John, MD  loratadine (CLARITIN) 10 MG tablet Take 1 tablet (10 mg total) by mouth daily as needed for allergies. 03/08/21  Yes Valentina Shaggy, MD  nitroGLYCERIN (NITROSTAT) 0.4 MG SL tablet Place 1 tablet (0.4 mg total) under the tongue every 5 (five) minutes as needed for chest pain. 06/21/15  Yes Brunetta Jeans,  PA-C  ondansetron (ZOFRAN ODT) 4 MG disintegrating tablet Take 1 tablet (4 mg total) by mouth every 8 (eight) hours as needed for nausea or vomiting. 07/06/20  Yes Valentina Shaggy, MD  OXYGEN Inhale 3-4 L into the lungs at bedtime. DURING THE DAY IF NEEDED Reported on 11/25/2015   Yes [provider]  predniSONE (DELTASONE) 5 MG tablet Take 1 tablet (5 mg total) by mouth daily. 03/08/21  Yes Valentina Shaggy, MD  Respiratory Therapy Supplies (NEBULIZER/TUBING/MOUTHPIECE) KIT 1 each by Does not apply route as directed. 07/24/19  Yes Valentina Shaggy, MD  Sodium Phosphates (ENEMA RE) Place 1 enema rectally daily as needed (constipation).   Yes [provider]  arformoterol (BROVANA) 15 MCG/2ML NEBU Take 2 mLs (15 mcg total) by nebulization 2 (two) times daily. Patient not taking: No sig reported 11/03/20   Valentina Shaggy, MD  budesonide (PULMICORT) 0.5 MG/2ML nebulizer solution Take 2 mLs (0.5 mg total) by nebulization 2 (two) times daily. Patient not taking: No sig reported 03/27/21   Valentina Shaggy, MD  cetirizine (ZYRTEC) 10 MG tablet Take 1 tablet (10 mg total) by mouth 2 (two) times daily. Patient not taking: No sig reported 10/06/20   Valentina Shaggy, MD  montelukast (SINGULAIR) 10 MG tablet Take 1 tablet (10 mg total)  by mouth at bedtime. Patient not taking: No sig reported 03/08/21   Valentina Shaggy, MD  predniSONE (DELTASONE) 10 MG tablet Take 1 tablet twice a day for four days before your injection, Then take 1 tablet twice a day for four days after your injection, then stop. Patient not taking: No sig reported 10/06/20   Valentina Shaggy, MD    Allergies    Aspirin, Hornet venom, Ivp dye [iodinated diagnostic agents], Levaquin [levofloxacin in d5w], Nsaids, Penicillins, Tolmetin, Fasenra [benralizumab], Perforomist [formoterol], Buprenorphine hcl, Morphine and related, and Oxycodone  Review of Systems   Review of Systems   Constitutional:  Negative for diaphoresis and fever.  HENT:  Negative for drooling.   Eyes:  Negative for visual disturbance.  Respiratory:  Positive for shortness of breath and wheezing.   Cardiovascular:  Positive for chest pain.  Gastrointestinal:  Positive for vomiting.  Genitourinary:  Negative for difficulty urinating.  Musculoskeletal:  Negative for neck stiffness.  Skin:  Negative for rash.  Neurological:  Negative for dizziness.  Psychiatric/Behavioral:  Negative for agitation.   All other systems reviewed and are negative.  Physical Exam Updated Vital Signs BP (!) 168/112   Pulse (!) 110   Temp 98.3 F (36.8 C) (Oral)   Resp 19   Ht '5\' 11"'  (1.803 m)   Wt 98.3 kg   SpO2 95%   BMI 30.23 kg/m   Physical Exam Vitals and nursing note reviewed.  Constitutional:      Appearance: Normal appearance.  HENT:     Head: Normocephalic and atraumatic.     Nose: Nose normal.  Eyes:     Conjunctiva/sclera: Conjunctivae normal.     Pupils: Pupils are equal, round, and reactive to light.  Cardiovascular:     Rate and Rhythm: Normal rate and regular rhythm.     Pulses: Normal pulses.     Heart sounds: Normal heart sounds.  Pulmonary:     Effort: Tachypnea and accessory muscle usage present.     Breath sounds: Decreased air movement present. Decreased breath sounds and wheezing present.  Abdominal:     General: Abdomen is flat. Bowel sounds are normal.     Palpations: Abdomen is soft.     Tenderness: There is no abdominal tenderness. There is no guarding.  Musculoskeletal:        General: Normal range of motion.  Skin:    General: Skin is warm and dry.     Capillary Refill: Capillary refill takes less than 2 seconds.  Neurological:     General: No focal deficit present.     Mental Status: He is alert and oriented to person, place, and time.  Psychiatric:        Mood and Affect: Mood normal.        Behavior: Behavior normal.    ED Results / Procedures / Treatments    Labs (all labs ordered are listed, but only abnormal results are displayed) Results for orders placed or performed during the hospital encounter of 04/11/21  Resp Panel by RT-PCR (Flu A&B, Covid) Nasopharyngeal Swab   Specimen: Nasopharyngeal Swab; Nasopharyngeal(NP) swabs in vial transport medium  Result Value Ref Range   SARS Coronavirus 2 by RT PCR NEGATIVE NEGATIVE   Influenza A by PCR NEGATIVE NEGATIVE   Influenza B by PCR NEGATIVE NEGATIVE  CBC with Differential/Platelet  Result Value Ref Range   WBC 7.7 4.0 - 10.5 K/uL   RBC 4.51 4.22 - 5.81 MIL/uL   Hemoglobin 15.1 13.0 -  17.0 g/dL   HCT 43.4 39.0 - 52.0 %   MCV 96.2 80.0 - 100.0 fL   MCH 33.5 26.0 - 34.0 pg   MCHC 34.8 30.0 - 36.0 g/dL   RDW 12.4 11.5 - 15.5 %   Platelets 253 150 - 400 K/uL   nRBC 0.0 0.0 - 0.2 %   Neutrophils Relative % 76 %   Neutro Abs 6.0 1.7 - 7.7 K/uL   Lymphocytes Relative 15 %   Lymphs Abs 1.1 0.7 - 4.0 K/uL   Monocytes Relative 4 %   Monocytes Absolute 0.3 0.1 - 1.0 K/uL   Eosinophils Relative 3 %   Eosinophils Absolute 0.2 0.0 - 0.5 K/uL   Basophils Relative 1 %   Basophils Absolute 0.0 0.0 - 0.1 K/uL   Immature Granulocytes 1 %   Abs Immature Granulocytes 0.04 0.00 - 0.07 K/uL  Comprehensive metabolic panel  Result Value Ref Range   Sodium 137 135 - 145 mmol/L   Potassium 4.1 3.5 - 5.1 mmol/L   Chloride 101 98 - 111 mmol/L   CO2 25 22 - 32 mmol/L   Glucose, Bld 130 (H) 70 - 99 mg/dL   BUN 12 8 - 23 mg/dL   Creatinine, Ser 1.18 0.61 - 1.24 mg/dL   Calcium 9.7 8.9 - 10.3 mg/dL   Total Protein 6.6 6.5 - 8.1 g/dL   Albumin 3.7 3.5 - 5.0 g/dL   AST 23 15 - 41 U/L   ALT 25 0 - 44 U/L   Alkaline Phosphatase 53 38 - 126 U/L   Total Bilirubin 0.5 0.3 - 1.2 mg/dL   GFR, Estimated >60 >60 mL/min   Anion gap 11 5 - 15  Brain natriuretic peptide  Result Value Ref Range   B Natriuretic Peptide 27.5 0.0 - 100.0 pg/mL  I-stat chem 8, ED (not at [facility] or [facility])  Result Value Ref Range    Sodium 136 135 - 145 mmol/L   Potassium 4.2 3.5 - 5.1 mmol/L   Chloride 104 98 - 111 mmol/L   BUN 13 8 - 23 mg/dL   Creatinine, Ser 1.10 0.61 - 1.24 mg/dL   Glucose, Bld 192 (H) 70 - 99 mg/dL   Calcium, Ion 1.12 (L) 1.15 - 1.40 mmol/L   TCO2 23 22 - 32 mmol/L   Hemoglobin 15.6 13.0 - 17.0 g/dL   HCT 46.0 39.0 - 52.0 %  Troponin I (High Sensitivity)  Result Value Ref Range   Troponin I (High Sensitivity) 7 <18 ng/L  Troponin I (High Sensitivity)  Result Value Ref Range   Troponin I (High Sensitivity) 6 <18 ng/L   DG Chest Portable 1 View  Result Date: 04/11/2021 CLINICAL DATA:  Dyspnea, nausea EXAM: PORTABLE CHEST 1 VIEW COMPARISON:  08/16/2020 FINDINGS: The heart size and mediastinal contours are within normal limits. Both lungs are clear. The visualized skeletal structures are unremarkable. IMPRESSION: No active disease. Electronically Signed   By: Fidela Salisbury MD   On: 04/11/2021 02:35    EKG EKG Interpretation  Date/Time:  Tuesday April 11 2021 02:23:49 EDT Ventricular Rate:  78 PR Interval:  169 QRS Duration: 101 QT Interval:  388 QTC Calculation: 442 R Axis:   52 Text Interpretation: Sinus rhythm Atrial premature complexes RSR' in V1 or V2, right VCD or RVH Confirmed by Randal Buba, Marvel Sapp ([id number]) on 04/11/2021 2:29:48 AM  Radiology DG Chest Portable 1 View  Result Date: 04/11/2021 CLINICAL DATA:  Dyspnea, nausea EXAM: PORTABLE CHEST 1 VIEW COMPARISON:  08/16/2020  FINDINGS: The heart size and mediastinal contours are within normal limits. Both lungs are clear. The visualized skeletal structures are unremarkable. IMPRESSION: No active disease. Electronically Signed   By: Fidela Salisbury MD   On: 04/11/2021 02:35    Procedures Procedures   Medications Ordered in ED Medications  albuterol (PROVENTIL) (2.5 MG/3ML) 0.083% nebulizer solution 5 mg (has no administration in time range)  albuterol (PROVENTIL,VENTOLIN) solution continuous neb (10 mg/hr Nebulization Given 04/11/21  0232)  ondansetron (ZOFRAN) injection 4 mg (4 mg Intravenous Given 04/11/21 0515)     ED Course  I have reviewed the triage vital signs and the nursing notes.  Pertinent labs & imaging results that were available during my care of the patient were reviewed by me and considered in my medical decision making (see chart for details). MDM Reviewed: previous chart, nursing note and vitals Interpretation: labs, ECG and x-ray (troponins negative, CXR NACPD by me) Total time providing critical care: 30-74 minutes (continues nebulizer treatment). This excludes time spent performing separately reportable procedures and services. Consults: admitting MD    CRITICAL CARE Performed by: Osa Campoli K Geovonni Meyerhoff-Rasch Total critical care time: 60 minutes Critical care time was exclusive of separately billable procedures and treating other patients. Critical care was necessary to treat or prevent imminent or life-threatening deterioration. Critical care was time spent personally by me on the following activities: development of treatment plan with patient and/or surrogate as well as nursing, discussions with consultants, evaluation of patient's response to treatment, examination of patient, obtaining history from patient or surrogate, ordering and performing treatments and interventions, ordering and review of laboratory studies, ordering and review of radiographic studies, pulse oximetry and re-evaluation of patient's condition.     Final Clinical Impression(s) / ED Diagnoses Final diagnoses:  COPD exacerbation ([place])   Admit to medicine     Azalyn Sliwa, MD 04/11/21 2820

## 2021-04-11 NOTE — ED Triage Notes (Signed)
Pt bib gems c/o of SOB and nausea starting yesterday. Pt woke up in the middle of the night with worsening SOB and found no relief with home albuterol treatments. Pt also experiencing intermittent chest pain. Hx of COPD, asthma. Currently on 3L home o2. 2 duonebs, 2g mag, 4mg  zofran, and 125mg  solumedrol given PTA. 20G LA.   BP: 154/86 (pt reports he hasnt taken antihypertensive medications in a couple weeks.  Spo2: 98% 3L  HR: 84

## 2021-04-11 NOTE — ED Notes (Signed)
Pt walking Sa02 92% at lowest.

## 2021-04-12 NOTE — Telephone Encounter (Signed)
Lm for pt  to call us back to see if he is still considering dupixent injections

## 2021-04-18 NOTE — Telephone Encounter (Signed)
Patient was returning a nurse call and said that he was having problems trying to swollen singulair pills. They will not go down, he has not problem with the other ones just that one. [phone]

## 2021-04-18 NOTE — Telephone Encounter (Signed)
Pt wants to move forward with the dupixent. Pt is drinking water before Singulair and he says it feels stuck in his throat for a day or two and drinking water afterwards.He is gagging and coughing after taking it.

## 2021-04-19 MED ORDER — CETIRIZINE HCL 10 MG PO TABS: 10.0000 mg | ORAL_TABLET | Freq: Two times a day (BID) | ORAL | 2 refills | Status: DC

## 2021-04-19 NOTE — Telephone Encounter (Signed)
He will probably need to get the flutter valve himself.  They can be purchased at [facility] or even [place].  I am not sure of the price.  I suppose we could try sending it in a miscellaneous device to use twice daily for mucous clearance.   [name], MD [facility] [place]

## 2021-04-19 NOTE — Telephone Encounter (Signed)
Spoke to patient and scheduled for 7/19 for Dupixent sample in clinic.  Also reviewed plan with prednisone and cetirizine Dr gallagher had suggested for before and after injections and sent refill in for cetirizine.  Patient inquired about the flutter valve he had been recommended and if it had been approved by his Ins?

## 2021-04-27 ENCOUNTER — Telehealth: Payer: Self-pay

## 2021-04-27 NOTE — Telephone Encounter (Signed)
Has been taking the zyrtec 1 tablet once a day and is not sure the dose of prednisone he is to take and I don't see where we sent it in pleas advise.

## 2021-04-27 NOTE — Telephone Encounter (Addendum)
Left a message for patient to call me in the [facility] office. Per patients last after visit summary Dr. Ernst Bowler wanted him to decrease his prednisone to 5 mg daily.

## 2021-04-28 NOTE — Telephone Encounter (Signed)
[facility] to see if patient has been scheduled. Patient has not been scheduled, but they do have the referral and are triaging it and will be in touch with the patient soon.

## 2021-04-28 NOTE — Telephone Encounter (Signed)
Per telephone encounter that started 10/03/2020 when patient was first going to start Dupixent, Dr.Gallagher stated: "I would recommend that he double up his prednisone to twice daily for four days before and four days after the injection. I also would make sure that he is using a second generation antihistamine twice daily for four days before and four days after the injection. Options include Zyrtec, Xyzal, or Allegra." Left a message for patient to call the office in regards to this matter. Will need to inform him Dr. Gillermina Hu recommendation

## 2021-04-28 NOTE — Telephone Encounter (Signed)
Discussed with [place] [facility] and agree with plan.  [name], MD [facility]

## 2021-05-02 ENCOUNTER — Ambulatory Visit: Payer: Medicare (Managed Care)

## 2021-05-03 NOTE — Telephone Encounter (Signed)
Left message for patient to call the office in regards to Dr. Gillermina Hu recommendation/medication plan prior to getting Dupixent.

## 2021-05-09 NOTE — Telephone Encounter (Signed)
I checked care everywhere and the patient is scheduled for 05/22/2021 with  Emeterio Reeve, PA-C.

## 2021-05-15 ENCOUNTER — Other Ambulatory Visit: Payer: Medicare (Managed Care) | Admitting: Hospice

## 2021-05-15 ENCOUNTER — Other Ambulatory Visit: Payer: Self-pay

## 2021-05-15 DIAGNOSIS — F419 Anxiety disorder, unspecified: Secondary | ICD-10-CM

## 2021-05-15 DIAGNOSIS — J449 Chronic obstructive pulmonary disease, unspecified: Secondary | ICD-10-CM

## 2021-05-15 DIAGNOSIS — K5901 Slow transit constipation: Secondary | ICD-10-CM

## 2021-05-15 DIAGNOSIS — Z515 Encounter for palliative care: Secondary | ICD-10-CM

## 2021-05-15 NOTE — Progress Notes (Signed)
[facility] Consult Note Telephone: [phone]  Fax: [phone]  PATIENT NAME: [name] DOB: 1951-04-15 MRN: [id number]  PRIMARY CARE PROVIDER:   Dr. Michell Heinrich [facility]: Einar Pheasant, DO Einar Pheasant, [place] [address] [place],  [place] [address] RESPONSIBLE PARTY: Self [phone] c Emergency contact: Jakie Debow [phone] Contact Information     Name Relation Home Work [place] Spouse [phone]  [phone]   Taylor,Jeanette Relative [phone]     Jahel, Wavra Mother [phone]        TELEHEALTH VISIT STATEMENT Due to the COVID-19 crisis, this visit was done via telemedicine and it was initiated and consent by this patient and or family. Video-audio (telehealth) contact was unable to be done due to technical barriers from the patient's side.   Visit is to build trust and highlight Palliative Medicine as specialized medical care for people living with serious illness, aimed at facilitating better quality of life through symptoms relief, assisting with advance care planning and complex medical decision making. This is a follow up visit.  RECOMMENDATIONS/PLAN:   Advance Care Planning/Code Status: Patient is a Full code  Goals of Care: Goals of care include to maximize quality of life and symptom management.  Visit consisted of counseling and education dealing with the complex and emotionally intense issues of symptom management and palliative care in the setting of serious and potentially life-threatening illness.  Patient reported his prostate cancer is spreading and this is concerning.  Therapeutic listening and ample emotional support provided; NP advised him to follow up with Oncologist. He accepted. Palliative care team will continue to support patient, patient's family, and medical team.  Symptom management/Plan:   Constipation: Continue Miralax daily as ordered and back off with diarrhea. He said he is not taking the Linzess because it makes him so nauseous. COPD/Asthma:Continue Prednisone, breathing treatments and oxygen supplementation 2-3L/Min as ordered. Followed by Pulmonolgist.  Bil Knee pain: Continue Tylenol and  Baclofen as ordered; effective. Anxiety: Continue Hydroxyzine as ordered. Slow deep breathing.  Follow up: Palliative care will continue to follow for complex medical decision making, advance care planning, and clarification of goals. Return 6 weeks or prn. Encouraged to call provider sooner with any concerns.  CHIEF COMPLAINT: Palliative follow up  HISTORY OF PRESENT ILLNESS:  [name] a 70 y.o. male with multiple medical problems including history of constipation, COPD/asthma with O2 dependency, Tuberculosis, Prostate Cancer, severe allergies, anxiety and schizophrenia. History obtained from review of EMR, discussion with primary team, family and/or patient. Records reviewed and summarized above. All 10 point systems reviewed and are negative except as documented in history of present illness above  Review and summarization of Epic records shows history from other than patient.   Palliative Care was asked to follow this patient o help address complex decision making in the context of advance care planning and goals of care clarification.   PERTINENT MEDICATIONS:  Outpatient Encounter Medications as of 05/15/2021  Medication Sig   albuterol (PROAIR HFA) 108 (90 Base) MCG/ACT inhaler Inhale 2 puffs into the lungs every 4 (four) hours as needed for wheezing or shortness of breath.   albuterol (PROVENTIL) (2.5 MG/3ML) 0.083% nebulizer solution Take 3 mLs (2.5 mg total) by nebulization every 6 (six) hours as needed for wheezing or shortness of breath.   arformoterol (BROVANA) 15 MCG/2ML NEBU Take 2  mLs (15 mcg total) by nebulization 2 (two) times daily. (Patient not taking: No sig  reported)   atorvastatin (LIPITOR) 20 MG tablet Take 20 mg by mouth daily.   baclofen (LIORESAL) 10 MG tablet Take 10 mg by mouth daily as needed for muscle spasms.   budesonide (PULMICORT) 0.5 MG/2ML nebulizer solution Take 2 mLs (0.5 mg total) by nebulization 2 (two) times daily. (Patient not taking: No sig reported)   cetirizine (ZYRTEC) 10 MG tablet Take 1 tablet (10 mg total) by mouth 2 (two) times daily.   diphenhydrAMINE (SOMINEX) 25 MG tablet Take 25 mg by mouth at bedtime as needed for itching.   docusate sodium (COLACE) 100 MG capsule Take 1 capsule (100 mg total) by mouth every 12 (twelve) hours.   EPINEPHrine (EPIPEN 2-PAK) 0.3 mg/0.3 mL IJ SOAJ injection Inject 0.3 mLs (0.3 mg total) into the muscle as needed for anaphylaxis.   fluticasone (FLONASE) 50 MCG/ACT nasal spray 1-2 sprays per nostril daily as needed (Patient taking differently: Place 1 spray into both nostrils daily as needed for allergies.)   ipratropium-albuterol (DUONEB) 0.5-2.5 (3) MG/3ML SOLN Take 3 mLs by nebulization every evening. (Patient taking differently: Take 3 mLs by nebulization in the morning and at bedtime.)   lactulose (CHRONULAC) 10 GM/15ML solution Take 30 mLs by mouth 3 (three) times daily as needed for mild constipation.   Lidocaine 5 % CREA Apply to perianal area as needed for pain. (Patient taking differently: Place 1 application around the anus daily as needed (pain).)   loratadine (CLARITIN) 10 MG tablet Take 1 tablet (10 mg total) by mouth daily as needed for allergies.   montelukast (SINGULAIR) 10 MG tablet Take 1 tablet (10 mg total) by mouth at bedtime. (Patient not taking: No sig reported)   nitroGLYCERIN (NITROSTAT) 0.4 MG SL tablet Place 1 tablet (0.4 mg total) under the tongue every 5 (five) minutes as needed for chest pain.   ondansetron (ZOFRAN ODT) 4 MG disintegrating tablet Take 1 tablet (4 mg total) by mouth every 8 (eight) hours as needed for nausea or vomiting.   OXYGEN Inhale 3-4 L  into the lungs at bedtime. DURING THE DAY IF NEEDED Reported on 11/25/2015   polyethylene glycol (MIRALAX / GLYCOLAX) 17 g packet Take 17 g by mouth daily.   predniSONE (DELTASONE) 20 MG tablet 2 tabs po daily x 4 days   Respiratory Therapy Supplies (NEBULIZER/TUBING/MOUTHPIECE) KIT 1 each by Does not apply route as directed.   Sodium Phosphates (ENEMA RE) Place 1 enema rectally daily as needed (constipation).   No facility-administered encounter medications on file as of 05/15/2021.    HOSPICE ELIGIBILITY/DIAGNOSIS: TBD  PAST MEDICAL HISTORY:  Past Medical History:  Diagnosis Date   Anxiety disorder    Arthritis    BILATERAL SHOULDERS, ELBOWS AND HANDS AND LEFT HIP AND KNEES--HX CORTISONE SHOTS IN SHOULDERS, ELBOWS, HIP AND KNEES   Asthma    Basal cell carcinoma of back 04/30/2016   Dermatologist- Dr [place] Crane;   MOHS sx- Dr Levada Dy    Bladder outlet obstruction    Chronic idiopathic constipation 62/94/7654   Complication of anesthesia    DIFFICULT WAKING    COPD (chronic obstructive pulmonary disease) Gold C Frequent exacerbations 01/13/2014   Spiro 07/08/14: FeV1 51% FeV1/FVC 66% FVC 59% 10/5/2015ONO RA was normal  10/13/2014  ONO on RA NORMAL    Depression    Diabetes mellitus without complication ([place])    BODERLINE - DIET CONTROL   Former smoker 01/13/2014  Gastroesophageal reflux disease without esophagitis    Heavy alcohol use 04/30/2016   History of oxygen administration    oxygen use 2 l/m nasally at bedtime and exertional occasions   History of rheumatic fever    Hx of multiple concussions    x 2 per patient    Hypertension    Melanoma ([place])    Nocturnal oxygen desaturation    USES O2 NIGHTLY   OSA (obstructive sleep apnea) 01/13/2014   PONV (postoperative nausea and vomiting)    Post-polio syndrome    polio at age 53--PT WAS IN IRON LUNG; PT WAS IN W/C UNTIL AGE 39; STILL HAS WEAKNESS RIGHT SIDE   Prostate cancer ([place])    Schizophrenia ([place])    Tuberculosis 1984    Hosp 4 months rx , left early    Urticaria      ALLERGIES:  Allergies  Allergen Reactions   Aspirin Anaphylaxis   Hornet Venom Anaphylaxis   Ivp Dye [Iodinated Diagnostic Agents] Anaphylaxis   Levaquin [Levofloxacin In D5w] Shortness Of Breath and Swelling    In addition: sweating, chest pain, and diarrhea.    Nsaids Anaphylaxis   Penicillins Anaphylaxis    Heart stops Has patient had a PCN reaction causing immediate rash, facial/tongue/throat swelling, SOB or lightheadedness with hypotension: yes Has patient had a PCN reaction causing severe rash involving mucus membranes or skin necrosis: NO Has patient had a PCN reaction that required hospitalization yes Has patient had a PCN reaction occurring within the last 10 years: No If all of the above answers are "NO", then may proceed with Cephalosporin use.    Tolmetin Anaphylaxis   Fasenra [Benralizumab] Hives   Perforomist [Formoterol]     Increased wheezing, shortness of breath   Buprenorphine Hcl Nausea And Vomiting    Can take with zofran    Morphine And Related Nausea And Vomiting    Can take with zofran    Oxycodone Itching and Rash      I spent 40 minutes providing this consultation; this includes time spent with patient/family, chart review and documentation. More than 50% of the time in this consultation was spent on counseling and coordinating communication   Thank you for the opportunity to participate in the care of [name] Please call our office at [phone] if we can be of additional assistance.  Note: Portions of this note were generated with [employer]. Dictation errors may occur despite best attempts at proofreading.  Teodoro Spray, NP

## 2021-05-16 ENCOUNTER — Ambulatory Visit: Payer: Medicare (Managed Care)

## 2021-05-17 NOTE — Telephone Encounter (Signed)
Thanks, Dee!!   Grey Schlauch, MD [facility]  

## 2021-06-28 ENCOUNTER — Telehealth: Payer: Self-pay

## 2021-06-28 NOTE — Telephone Encounter (Signed)
Contacted pt to cancel [facility] appt that was accidentally scheduled at which time pt expressed a need for a need for a PCP for complaint of abd/rectal pain, bleeding, and frequent use of enemas. Pt also states that he has a history of prostate CA. Pt was advised to go to Urgent care for evaluation,notified him that [facility] was close to him and was open until 8pm tonight. Pt was advised that I would send him a list of primary care offices to check and see if his insurance is accepted. Pt was advised again to go to the urgent care and he said that he would go tomorrow.    Addendum:Pt was contacted again to clarify extent of bleeding. Pt stated that blood is dark red and is not only with bowel movements, but not profuse. PT again was advised to go to the ER. Pt refused and stated that he will go tomorrow morning. Advised pt that if symptoms worsen or bleeding increase, that he should go to the ER today. Pt verbalized understanding. Call was ended.   Marcille Blanco, CMA

## 2021-06-29 ENCOUNTER — Other Ambulatory Visit: Payer: Self-pay

## 2021-06-29 ENCOUNTER — Encounter (INDEPENDENT_AMBULATORY_CARE_PROVIDER_SITE_OTHER): Payer: Self-pay

## 2021-06-29 ENCOUNTER — Emergency Department (HOSPITAL_COMMUNITY): Payer: Medicare (Managed Care)

## 2021-06-29 ENCOUNTER — Emergency Department (HOSPITAL_COMMUNITY)
Admission: EM | Admit: 2021-06-29 | Discharge: 2021-06-29 | Disposition: A | Discharge status: Home / Self Care | Payer: Medicare (Managed Care) | Attending: Emergency Medicine | Admitting: Emergency Medicine

## 2021-06-29 ENCOUNTER — Encounter (HOSPITAL_COMMUNITY): Payer: Self-pay

## 2021-06-29 DIAGNOSIS — Z8546 Personal history of malignant neoplasm of prostate: Secondary | ICD-10-CM | POA: Insufficient documentation

## 2021-06-29 DIAGNOSIS — Z85828 Personal history of other malignant neoplasm of skin: Secondary | ICD-10-CM | POA: Diagnosis not present

## 2021-06-29 DIAGNOSIS — Z8616 Personal history of COVID-19: Secondary | ICD-10-CM | POA: Insufficient documentation

## 2021-06-29 DIAGNOSIS — K6289 Other specified diseases of anus and rectum: Secondary | ICD-10-CM | POA: Diagnosis not present

## 2021-06-29 DIAGNOSIS — Z87891 Personal history of nicotine dependence: Secondary | ICD-10-CM | POA: Insufficient documentation

## 2021-06-29 DIAGNOSIS — K5900 Constipation, unspecified: Secondary | ICD-10-CM | POA: Diagnosis not present

## 2021-06-29 DIAGNOSIS — J45909 Unspecified asthma, uncomplicated: Secondary | ICD-10-CM | POA: Diagnosis not present

## 2021-06-29 DIAGNOSIS — I10 Essential (primary) hypertension: Secondary | ICD-10-CM | POA: Diagnosis not present

## 2021-06-29 DIAGNOSIS — E119 Type 2 diabetes mellitus without complications: Secondary | ICD-10-CM | POA: Insufficient documentation

## 2021-06-29 DIAGNOSIS — Z79899 Other long term (current) drug therapy: Secondary | ICD-10-CM | POA: Diagnosis not present

## 2021-06-29 DIAGNOSIS — Z85038 Personal history of other malignant neoplasm of large intestine: Secondary | ICD-10-CM | POA: Diagnosis not present

## 2021-06-29 DIAGNOSIS — J441 Chronic obstructive pulmonary disease with (acute) exacerbation: Secondary | ICD-10-CM | POA: Diagnosis not present

## 2021-06-29 LAB — POC OCCULT BLOOD, ED: Fecal Occult Bld: NEGATIVE

## 2021-06-29 LAB — COMPREHENSIVE METABOLIC PANEL
ALT: 28 U/L (ref 0–44)
AST: 27 U/L (ref 15–41)
Albumin: 4.1 g/dL (ref 3.5–5.0)
Alkaline Phosphatase: 54 U/L (ref 38–126)
Anion gap: 10 (ref 5–15)
BUN: 14 mg/dL (ref 8–23)
CO2: 21 mmol/L — ABNORMAL LOW (ref 22–32)
Calcium: 9 mg/dL (ref 8.9–10.3)
Chloride: 104 mmol/L (ref 98–111)
Creatinine, Ser: 1.24 mg/dL (ref 0.61–1.24)
GFR, Estimated: >60 mL/min (ref >60)
Glucose, Bld: 117 mg/dL — ABNORMAL HIGH (ref 70–99)
Potassium: 4.2 mmol/L (ref 3.5–5.1)
Sodium: 135 mmol/L (ref 135–145)
Total Bilirubin: 1.1 mg/dL (ref 0.3–1.2)
Total Protein: 7.2 g/dL (ref 6.5–8.1)

## 2021-06-29 LAB — CBC
HCT: 46.8 % (ref 39.0–52.0)
Hemoglobin: 15.8 g/dL (ref 13.0–17.0)
MCH: 33.2 pg (ref 26.0–34.0)
MCHC: 33.8 g/dL (ref 30.0–36.0)
MCV: 98.3 fL (ref 80.0–100.0)
Platelets: 245 10*3/uL (ref 150–400)
RBC: 4.76 MIL/uL (ref 4.22–5.81)
RDW: 13.2 % (ref 11.5–15.5)
WBC: 7.4 10*3/uL (ref 4.0–10.5)
nRBC: 0 % (ref 0.0–0.2)

## 2021-06-29 LAB — URINALYSIS, ROUTINE W REFLEX MICROSCOPIC
Bilirubin Urine: NEGATIVE
Glucose, UA: NEGATIVE mg/dL
Hgb urine dipstick: NEGATIVE
Ketones, ur: NEGATIVE mg/dL
Leukocytes,Ua: NEGATIVE
Nitrite: NEGATIVE
Protein, ur: NEGATIVE mg/dL
Specific Gravity, Urine: 1.02 (ref 1.005–1.030)
pH: 6 (ref 5.0–8.0)

## 2021-06-29 LAB — LIPASE, BLOOD: Lipase: 38 U/L (ref 11–51)

## 2021-06-29 NOTE — Discharge Instructions (Signed)
Your lab work was unremarkable, including no anemia, no electrolyte derangements secondary to your frequent enemas.  I recommend you continue your enemas currently for your constipation.  I recommend that you follow-up with a gastroenterologist for colonoscopy, and an EGD to evaluate the difficulty that you are having with swallowing food/pills.  Your CT was unremarkable, did not show any intra-abdominal abnormality at this time.  Please follow-up if you have worsening abdominal pain, intractable nausea or vomiting.  It was a pleasure taking care of you, I hope that you have a great rest of your day.

## 2021-06-29 NOTE — ED Triage Notes (Signed)
Patient c/o rectal pain and states that he has intermittent rectal bleeding x 3 months, but states it is worse in the past 12 days.

## 2021-06-29 NOTE — ED Provider Notes (Signed)
[facility] DEPT Provider Note   CSN: [id number] Arrival date & time: 06/29/21  1030     History Chief Complaint  Patient presents with   Rectal Pain   Hypertension    [name] is a 70 y.o. male with past medical history significant for known diagnosis of prostate cancer, reports he has been struggling with constipation for years who presents with new onset worsening of constipation, rectal bleeding, new difficulty swallowing pills, and abdominal pain.  Patient reports he began having difficulty swallowing pills a few weeks ago, has to cut them in half, has to cut his food into small pieces, still able to swallow liquids without difficulty.  Patient reports that he has been on multiple different bowel regimens over the last months to years, is currently giving himself multiple enemas per day, most recently had a bowel movement today.  Patient sees bright red blood per rectum sometimes but not always.  Questionable history of hemorrhoids per patient.  She also endorses worsening of his reflux at this time.  Patient currently not taking any medications for reflux or any constipation medications by mouth secondary to difficulty with swallowing.  Patient with significant rectal pain with bowel movements.   Hypertension Associated symptoms include abdominal pain. Pertinent negatives include no chest pain and no shortness of breath.      Past Medical History:  Diagnosis Date   Anxiety disorder    Arthritis    BILATERAL SHOULDERS, ELBOWS AND HANDS AND LEFT HIP AND KNEES--HX CORTISONE SHOTS IN SHOULDERS, ELBOWS, HIP AND KNEES   Asthma    Basal cell carcinoma of back 04/30/2016   Dermatologist- Dr Nevada Crane;   MOHS sx- Dr Levada Dy    Bladder outlet obstruction    Chronic idiopathic constipation 37/07/6268   Complication of anesthesia    DIFFICULT WAKING    COPD (chronic obstructive pulmonary disease) [place] Frequent exacerbations 01/13/2014   [name] 07/08/14:  FeV1 51% FeV1/FVC 66% FVC 59% 10/5/2015ONO RA was normal  10/13/2014  ONO on RA NORMAL    Depression    Diabetes mellitus without complication ([place])    BODERLINE - DIET CONTROL   Former smoker 01/13/2014   Gastroesophageal reflux disease without esophagitis    Heavy alcohol use 04/30/2016   History of oxygen administration    oxygen use 2 l/m nasally at bedtime and exertional occasions   History of rheumatic fever    Hx of multiple concussions    x 2 per patient    Hypertension    Melanoma ([place])    Nocturnal oxygen desaturation    USES O2 NIGHTLY   OSA (obstructive sleep apnea) 01/13/2014   PONV (postoperative nausea and vomiting)    Post-polio syndrome    polio at age 37--PT WAS IN IRON LUNG; PT WAS IN W/C UNTIL AGE 52; STILL HAS WEAKNESS RIGHT SIDE   Prostate cancer ([place])    Schizophrenia ([place])    Tuberculosis 1984   Hosp 4 months rx , left early    Urticaria     Patient Active Problem List   Diagnosis Date Noted   Acute exacerbation of chronic obstructive pulmonary disease (COPD) ([place]) 04/11/2021   Pneumonia due to COVID-19 virus 07/27/2020   COPD with acute exacerbation ([place]) 12/04/2018   PSVT (paroxysmal supraventricular tachycardia) ([place]) 09/23/2018   Seasonal and perennial allergic rhinitis 07/30/2018   Asthma-COPD overlap syndrome ([place]) 07/07/2018   S/P arthroscopy of right shoulder 04/25/2017   Generalized anxiety disorder 04/22/2017   Severe persistent  asthma without complication 16/94/5038   Bee sting-induced anaphylaxis 02/12/2017   Anaphylactic reaction due to food, initial encounter 02/12/2017   Current use of beta blocker 02/12/2017   Allergic rhinitis 01/15/2017   COPD exacerbation ([place]) 11/04/2016   Asthma exacerbation 11/03/2016   Nausea without vomiting 09/17/2016   Shortness of breath    Diabetes mellitus type 2 in obese ([place]) 06/26/2016   non-specific Chest pain 06/12/2016   Patient's noncompliance with other medical treatment and regimen 06/02/2016    Hyperlipidemia, mixed 06/02/2016   Elevated liver enzymes  06/02/2016   Schizophrenia ([place]) 04/30/2016   Environmental and seasonal allergies 04/30/2016   Diabetes mellitus without complication- (diet controlled; w/o proteinuria) 04/30/2016   Heavy alcohol use- Many yrs  04/30/2016   Basal cell carcinoma of back 04/30/2016   Obesity 04/30/2016   Hx of multiple concussions 04/30/2016   h/o Vitamin D deficiency 04/30/2016   Screen for sexually transmitted diseases 04/30/2016   Immunocompromised state ([place]) 04/30/2016   Cervical radiculopathy 01/15/2016   Alcohol withdrawal ([place]) 11/22/2015   Alcohol abuse with alcohol-induced mood disorder ([place]) 11/22/2015   h/o Suicidal thoughts 11/21/2015   Dry mouth 10/07/2015   Anxiety 09/19/2015   OAB (overactive bladder) 07/27/2015   Chronic idiopathic constipation 05/08/2015   Screening for colon cancer    Benign neoplasm of cecum    Benign neoplasm of ascending colon    Benign neoplasm of transverse colon    Benign neoplasm of sigmoid colon    Rectal polyp    Gastroesophageal reflux disease without esophagitis    Esophageal stricture    Joint stiffness of multiple sites 10/17/2014   Benign localized hyperplasia of prostate with urinary obstruction 09/29/2014   concerns for memory loss 09/13/2014   Tuberculosis    Diverticulum of bladder 06/17/2014   Acute urinary retention 05/31/2014   Constipation 04/29/2014   Neuropathy ([place]) 04/11/2014   Post-polio syndrome 01/13/2014   Other emphysema ([place]) 01/13/2014   Hypertension 01/13/2014   Former smoker 01/13/2014   h/o Prostate cancer 01/13/2014   History of tuberculosis 01/13/2014   Bipolar disorder ([place]) 01/13/2014   Explosive personality disorder ([place]) 01/13/2014   Erectile dysfunction 01/13/2014   Nephrolithiasis 01/13/2014   Chronic pain 01/13/2014   Obstructive sleep apnea 88/28/0034   Alcoholic gastritis 91/79/1505   Nocturnal hypoxia 08/10/2013    Past Surgical History:   Procedure Laterality Date   ADENOIDECTOMY     CARDIOVASCULAR STRESS TEST  06-08-2014  dr Mare Ferrari   normal lexiscan study/  no ischemia/  not gated due to PAC's   COLONOSCOPY N/A 05/03/2015   Procedure: COLONOSCOPY;  Surgeon: [name], MD;  Location: [facility];  Service: Endoscopy;  Laterality: N/A;   CYSTOSCOPY N/A 10/25/2015   Procedure: CYSTOSCOPY;  Surgeon: [name], MD;  Location: [facility];  Service: Urology;  Laterality: N/A;   CYSTOSCOPY W/ CYSTOGRAM/  TRANSRECTAL ULTRASOUND PROSTATE BX  03-22-2009   ESOPHAGOGASTRODUODENOSCOPY N/A 03/22/2015   Procedure: ESOPHAGOGASTRODUODENOSCOPY (EGD) with dilation;  Surgeon: [name], MD;  Location: [facility];  Service: Endoscopy;  Laterality: N/A;   excision of skin lesion     LAPAROSCOPIC CHOLECYSTECTOMY  2013   left elbow surgery      due to fracture    NASAL SEPTUM SURGERY  2000   OTHER SURGICAL HISTORY      Muscle & bone Graft/Polio   polio surgeries      14 polio surgeries    PROSTATE BIOPSY N/A 09/28/2014   Procedure: PROSTATE ULTRASOUND/BIOPSY;  Surgeon: [name], MD;  Location: [facility];  Service: Urology;  Laterality: N/A;   PROSTATE BIOPSY N/A 10/25/2015   Procedure: PROSTATE BIOPSY AND ULTRASOUND;  Surgeon: [name], MD;  Location: [facility];  Service: Urology;  Laterality: N/A;   SAVORY DILATION N/A 03/22/2015   Procedure: SAVORY DILATION;  Surgeon: [name], MD;  Location: [facility];  Service: Endoscopy;  Laterality: N/A;   SHOULDER ARTHROSCOPY WITH OPEN ROTATOR CUFF REPAIR Bilateral 2013  &  1999   removal spurs and bursectomy   TONSILLECTOMY     TRANSURETHRAL INCISION OF BLADDER NECK N/A 10/25/2015   Procedure:  TRANSURETHRAL INCISION OF BLADDER NECK;  Surgeon: [name], MD;  Location: [facility];  Service: Urology;  Laterality: N/A;   TRANSURETHRAL RESECTION OF PROSTATE N/A 09/28/2014   Procedure: TRANSURETHRAL RESECTION OF THE PROSTATE (TURP);  Surgeon: [name], MD;  Location: [facility];  Service: Urology;   Laterality: N/A;   URETEROSOPY STONE EXTRACTION  2000       Family History  Problem Relation Age of Onset   Alzheimer's disease Father 4       Deceased   Stomach cancer Father    Heart attack Father    Heart disease Father    Skin cancer Mother        Facial-Living   Alcohol abuse Sister        x2   Mental illness Sister        x2   Alcohol abuse Sister    Diabetes Maternal Aunt        x2   Thyroid disease Maternal Aunt        x4   Diabetes Maternal Uncle    Tuberculosis Paternal Grandfather    Tuberculosis Paternal Grandmother    Alzheimer's disease Paternal Aunt    Alzheimer's disease Paternal Uncle    Colon cancer Neg Hx    Colon polyps Neg Hx    Crohn's disease Neg Hx    Ulcerative colitis Neg Hx    Allergic rhinitis Neg Hx    Angioedema Neg Hx    Asthma Neg Hx    Eczema Neg Hx    Urticaria Neg Hx     Social History   Tobacco Use   Smoking status: Former    Types: Pipe    Quit date: 08/25/2013    Years since quitting: 7.8   Smokeless tobacco: Former    Types: Chew   Tobacco comments:    5-6 times smoking a pipe  Vaping Use   Vaping Use: Never used  Substance Use Topics   Alcohol use: Yes   Drug use: No    Home Medications Prior to Admission medications   Medication Sig Start Date End Date Taking? Authorizing Provider  albuterol (PROAIR HFA) 108 (90 Base) MCG/ACT inhaler Inhale 2 puffs into the lungs every 4 (four) hours as needed for wheezing or shortness of breath. 03/08/21   Valentina Shaggy, MD  albuterol (PROVENTIL) (2.5 MG/3ML) 0.083% nebulizer solution Take 3 mLs (2.5 mg total) by nebulization every 6 (six) hours as needed for wheezing or shortness of breath. 02/15/21   Valentina Shaggy, MD  arformoterol [facility]) 15 MCG/2ML NEBU Take 2 mLs (15 mcg total) by nebulization 2 (two) times daily. Patient not taking: No sig reported 11/03/20   Valentina Shaggy, MD  atorvastatin (LIPITOR) 20 MG tablet Take 20 mg by mouth daily. 03/30/21    [provider]  baclofen (LIORESAL) 10 MG tablet  Take 10 mg by mouth daily as needed for muscle spasms. 08/25/19   [provider]  budesonide (PULMICORT) 0.5 MG/2ML nebulizer solution Take 2 mLs (0.5 mg total) by nebulization 2 (two) times daily. Patient not taking: No sig reported 03/27/21   Valentina Shaggy, MD  cetirizine (ZYRTEC) 10 MG tablet Take 1 tablet (10 mg total) by mouth 2 (two) times daily. 04/19/21   Valentina Shaggy, MD  diphenhydrAMINE (SOMINEX) 25 MG tablet Take 25 mg by mouth at bedtime as needed for itching.    [provider]  docusate sodium (COLACE) 100 MG capsule Take 1 capsule (100 mg total) by mouth every 12 (twelve) hours. 04/11/21   Sherwood Gambler, MD  EPINEPHrine (EPIPEN 2-PAK) 0.3 mg/0.3 mL IJ SOAJ injection Inject 0.3 mLs (0.3 mg total) into the muscle as needed for anaphylaxis. 07/03/19   Valentina Shaggy, MD  fluticasone [facility]) 50 MCG/ACT nasal spray 1-2 sprays per nostril daily as needed Patient taking differently: Place 1 spray into both nostrils daily as needed for allergies. 03/08/21   Valentina Shaggy, MD  ipratropium-albuterol (DUONEB) 0.5-2.5 (3) MG/3ML SOLN Take 3 mLs by nebulization every evening. Patient taking differently: Take 3 mLs by nebulization in the morning and at bedtime. 01/21/20   Valentina Shaggy, MD  lactulose (CHRONULAC) 10 GM/15ML solution Take 30 mLs by mouth 3 (three) times daily as needed for mild constipation. 01/16/21   [provider]  Lidocaine 5 % CREA Apply to perianal area as needed for pain. Patient taking differently: Place 1 application around the anus daily as needed (pain). 10/09/20   Molpus, John, MD  loratadine (CLARITIN) 10 MG tablet Take 1 tablet (10 mg total) by mouth daily as needed for allergies. 03/08/21   Valentina Shaggy, MD  montelukast (SINGULAIR) 10 MG tablet Take 1 tablet (10 mg total) by mouth at bedtime. Patient not taking: No sig reported 03/08/21    Valentina Shaggy, MD  nitroGLYCERIN (NITROSTAT) 0.4 MG SL tablet Place 1 tablet (0.4 mg total) under the tongue every 5 (five) minutes as needed for chest pain. 06/21/15   Brunetta Jeans, PA-C  ondansetron (ZOFRAN ODT) 4 MG disintegrating tablet Take 1 tablet (4 mg total) by mouth every 8 (eight) hours as needed for nausea or vomiting. 07/06/20   Valentina Shaggy, MD  OXYGEN Inhale 3-4 L into the lungs at bedtime. DURING THE DAY IF NEEDED Reported on 11/25/2015    [provider]  polyethylene glycol (MIRALAX / GLYCOLAX) 17 g packet Take 17 g by mouth daily. 04/11/21   Sherwood Gambler, MD  predniSONE (DELTASONE) 20 MG tablet 2 tabs po daily x 4 days 04/12/21   Sherwood Gambler, MD  Respiratory Therapy Supplies (NEBULIZER/TUBING/MOUTHPIECE) KIT 1 each by Does not apply route as directed. 07/24/19   Valentina Shaggy, MD  Sodium Phosphates (ENEMA RE) Place 1 enema rectally daily as needed (constipation).    [provider]    Allergies    Aspirin, Hornet venom, Ivp dye [iodinated diagnostic agents], Levaquin [levofloxacin in d5w], Nsaids, Penicillins, Tolmetin, Fasenra [benralizumab], Perforomist [formoterol], Buprenorphine hcl, Morphine and related, and Oxycodone  Review of Systems   Review of Systems  Constitutional:  Negative for chills and fever.  HENT:  Positive for trouble swallowing.   Respiratory:  Negative for chest tightness and shortness of breath.   Cardiovascular:  Negative for chest pain.  Gastrointestinal:  Positive for abdominal pain, constipation and rectal pain.  Genitourinary:  Negative for dysuria.  All other systems reviewed and are negative.  Physical Exam Updated Vital Signs BP (!) 145/82 (BP Location: Left Arm)   Pulse 64   Temp 97.9 F (36.6 C)   Resp 18   Ht 6' (1.829 m)   Wt 101.6 kg   SpO2 100%   BMI 30.38 kg/m   Physical Exam Vitals and nursing note reviewed.  Constitutional:      General: He is not in acute distress.     Appearance: Normal appearance.  HENT:     Head: Normocephalic and atraumatic.  Eyes:     General:        Right eye: No discharge.        Left eye: No discharge.  Cardiovascular:     Rate and Rhythm: Normal rate and regular rhythm.     Heart sounds: No murmur heard.   No friction rub. No gallop.  Pulmonary:     Effort: Pulmonary effort is normal.     Breath sounds: Normal breath sounds.  Abdominal:     Comments: Protuberant abdomen, with tenderness to palpation focally in epigastric region.  No rebound, guarding, rigidity.  No ecchymoses or hemorrhage.  Fluid wave, shifting dullness.  Genitourinary:    Comments: Rectal exam significant for enlarged prostate, diffusely enlarged without obvious one-sided mass.  No active bleeding from the rectum.  No obvious internal or external hemorrhoids palpated. Skin:    General: Skin is warm and dry.     Capillary Refill: Capillary refill takes less than 2 seconds.  Neurological:     Mental Status: He is alert and oriented to person, place, and time.  Psychiatric:        Mood and Affect: Mood normal.        Behavior: Behavior normal.    ED Results / Procedures / Treatments   Labs (all labs ordered are listed, but only abnormal results are displayed) Labs Reviewed  COMPREHENSIVE METABOLIC PANEL - Abnormal; Notable for the following components:      Result Value   CO2 21 (*)    Glucose, Bld 117 (*)    All other components within normal limits  CBC  URINALYSIS, ROUTINE W REFLEX MICROSCOPIC  LIPASE, BLOOD  POC OCCULT BLOOD, ED    EKG None  Radiology CT ABDOMEN PELVIS WO CONTRAST  Result Date: 06/29/2021 CLINICAL DATA:  Rectal pain and bleeding for 3 months, worsening the last several days, constipation, history of prostate cancer EXAM: CT ABDOMEN AND PELVIS WITHOUT CONTRAST TECHNIQUE: Multidetector CT imaging of the abdomen and pelvis was performed following the standard protocol without IV contrast. COMPARISON:  10/09/2020 FINDINGS:  Lower chest: No acute abnormality. Bandlike subpleural scarring, consistent with sequelae of prior infection or inflammation, particularly suggesting sequelae of prior COVID airspace disease. Hepatobiliary: No focal liver abnormality is seen. Status post cholecystectomy. No biliary dilatation. Pancreas: Unremarkable. No pancreatic ductal dilatation or surrounding inflammatory changes. Spleen: Normal in size without significant abnormality. Adrenals/Urinary Tract: Adrenal glands are unremarkable. Kidneys are normal, without renal calculi, solid lesion, or hydronephrosis. Unchanged, incidental small ureterocele of the distal left ureter (series 2, image 82). Bladder is unremarkable. Stomach/Bowel: Stomach is within normal limits. Appendix appears normal. No evidence of bowel wall thickening, distention, or inflammatory changes. Vascular/Lymphatic: Aortic atherosclerosis. No enlarged abdominal or pelvic lymph nodes. Reproductive: No mass or other significant abnormality. Other: Fat containing left inguinal hernia. No abdominopelvic ascites. Musculoskeletal: No acute or significant osseous findings. IMPRESSION: 1. No acute CT findings of the abdomen or pelvis to  explain rectal bleeding. No noncontrast CT abnormality of the rectum, which is limited for assessment of the rectum and anus. 2. No large burden of stool in the colon, per report of constipation. Aortic Atherosclerosis (ICD10-I70.0). Electronically Signed   By: Eddie Candle M.D.   On: 06/29/2021 13:58    Procedures Procedures   Medications Ordered in ED Medications - No data to display  ED Course  I have reviewed the triage vital signs and the nursing notes.  Pertinent labs & imaging results that were available during my care of the patient were reviewed by me and considered in my medical decision making (see chart for details).    MDM Rules/Calculators/A&P                         I discussed this case with my attending physician who cosigned  this note including patient's presenting symptoms, physical exam, and planned diagnostics and interventions. Attending physician stated agreement with plan or made changes to plan which were implemented.   Attending physician assessed patient at bedside.   Trouble swallowing: Patient reports progressive dysphagia over the last 4 to 5 years, has difficulty swallowing pills, and food and less it is very well chewed.  Patient reports that he has had this on and off, has resolved somewhat in the past.  Patient has not had an EGD, and does have a gastroenterologist that he needs to follow-up with.  Patient is able to tolerate own secretions, liquids, and well chewed food at this time do not believe he needs emergent admission for EGD.  Recommend follow-up at earliest convenience for further evaluation of difficulty swallowing.  Abdominal pain/constipation: Labwork without abnormality, patient is afebrile, CT does not reveal any acute intra-abdominal abnormality.  For difficulty with gastric transit in context of large medical history of previous GI concerns. No peritonitic signs, no concern for acute mesenteric ischemia.  Patient without nausea, vomiting at time of discharge.  Rectal exam is not concerning for an acute obstruction at the anus or distal rectum.  No hemorrhoids palpated.  No active bleeding.  Patient currently requiring enemas to have bowel movement, however this regimen is currently working and patient had a bowel movement this morning.  Recommend immediate evaluation if patient is no longer able to have bowel movement.  Recommend evaluation with gastroenterology for further evaluation of constipation at patient's earliest convenience.  Return precautions given. Final Clinical Impression(s) / ED Diagnoses Final diagnoses:  Constipation, unspecified constipation type  Rectal pain    Rx / DC Orders ED Discharge Orders     None        Anselmo Pickler, PA-C 06/29/21 1500     Malvin Johns, MD 06/29/21 1553

## 2021-06-29 NOTE — ED Notes (Signed)
Lab contacted to process lipase.

## 2021-06-30 ENCOUNTER — Ambulatory Visit: Payer: Medicare (Managed Care)

## 2021-07-31 ENCOUNTER — Telehealth: Payer: Self-pay | Admitting: Allergy & Immunology

## 2021-07-31 MED ORDER — ALBUTEROL SULFATE (2.5 MG/3ML) 0.083% IN NEBU: 2.5000 mg | INHALATION_SOLUTION | Freq: Four times a day (QID) | RESPIRATORY_TRACT | 0 refills | Status: DC | PRN

## 2021-07-31 NOTE — Telephone Encounter (Signed)
Sent in refill of pulmicort informed pharmacy to file it under part b

## 2021-07-31 NOTE — Telephone Encounter (Signed)
Patient is requesting a refill for his nebulizer solution to go in his machine. [facility] on [address].

## 2021-08-10 ENCOUNTER — Other Ambulatory Visit: Payer: Self-pay

## 2021-08-10 ENCOUNTER — Encounter: Payer: Self-pay | Admitting: Emergency Medicine

## 2021-08-10 ENCOUNTER — Ambulatory Visit
Admission: EM | Admit: 2021-08-10 | Discharge: 2021-08-10 | Disposition: A | Discharge status: Nursing Home (Includes ICF) | Payer: Medicare (Managed Care)

## 2021-08-10 DIAGNOSIS — R0789 Other chest pain: Secondary | ICD-10-CM | POA: Diagnosis not present

## 2021-08-10 NOTE — ED Provider Notes (Signed)
[name]    CSN: [id number] Arrival date & time: 08/10/21  1451      History   Chief Complaint Chief Complaint  Patient presents with   Chest Pain    HPI [name] is a 70 y.o. male.   Patient presents for further evaluation of chest pain.  Patient reports that he started with fatigue at the beginning of the week.  Chest pain started around 3 AM this morning.  Patient reports that he woke up and took nitroglycerin with relief of chest pain.  Chest pain returned around 9:30 AM when he took nitroglycerin that did not relieve the chest pain.  Patient has also had diaphoresis.  Denies any chest pain.  Patient reports that the pain radiates down the left arm as well.  Has also had some intermittent dizziness.  Denies any history of MI but does report history of A. fib.  Patient is not currently taking any blood thinners.   Chest Pain  Past Medical History:  Diagnosis Date   Anxiety disorder    Arthritis    BILATERAL SHOULDERS, ELBOWS AND HANDS AND LEFT HIP AND KNEES--HX CORTISONE SHOTS IN SHOULDERS, ELBOWS, HIP AND KNEES   Asthma    Basal cell carcinoma of back 04/30/2016   Dermatologist- Dr Nevada Crane;   MOHS sx- Dr Levada Dy    Bladder outlet obstruction    Chronic idiopathic constipation 12/45/8099   Complication of anesthesia    DIFFICULT WAKING    COPD (chronic obstructive pulmonary disease) [place] Frequent exacerbations 01/13/2014   [name] 07/08/14: FeV1 51% FeV1/FVC 66% FVC 59% 10/5/2015ONO RA was normal  10/13/2014  ONO on RA NORMAL    Depression    Diabetes mellitus without complication ([place])    BODERLINE - DIET CONTROL   Former smoker 01/13/2014   Gastroesophageal reflux disease without esophagitis    Heavy alcohol use 04/30/2016   History of oxygen administration    oxygen use 2 l/m nasally at bedtime and exertional occasions   History of rheumatic fever    Hx of multiple concussions    x 2 per patient    Hypertension    Melanoma ([place])     Nocturnal oxygen desaturation    USES O2 NIGHTLY   OSA (obstructive sleep apnea) 01/13/2014   PONV (postoperative nausea and vomiting)    Post-polio syndrome    polio at age 5--PT WAS IN IRON LUNG; PT WAS IN W/C UNTIL AGE 71; STILL HAS WEAKNESS RIGHT SIDE   Prostate cancer ([place])    Schizophrenia ([place])    Tuberculosis 1984   Hosp 4 months rx , left early    Urticaria     Patient Active Problem List   Diagnosis Date Noted   Acute exacerbation of chronic obstructive pulmonary disease (COPD) ([place]) 04/11/2021   Pneumonia due to COVID-19 virus 07/27/2020   COPD with acute exacerbation ([place]) 12/04/2018   PSVT (paroxysmal supraventricular tachycardia) ([place]) 09/23/2018   Seasonal and perennial allergic rhinitis 07/30/2018   Asthma-COPD overlap syndrome ([place]) 07/07/2018   S/P arthroscopy of right shoulder 04/25/2017   Generalized anxiety disorder 04/22/2017   Severe persistent asthma without complication 83/38/2505   Bee sting-induced anaphylaxis 02/12/2017   Anaphylactic reaction due to food, initial encounter 02/12/2017   Current use of beta blocker 02/12/2017   Allergic rhinitis 01/15/2017   COPD exacerbation ([place]) 11/04/2016   Asthma exacerbation 11/03/2016   Nausea without vomiting 09/17/2016   Shortness of breath    Diabetes mellitus type 2 in  obese ([place]) 06/26/2016   non-specific Chest pain 06/12/2016   Patient's noncompliance with other medical treatment and regimen 06/02/2016   Hyperlipidemia, mixed 06/02/2016   Elevated liver enzymes  06/02/2016   Schizophrenia ([place]) 04/30/2016   Environmental and seasonal allergies 04/30/2016   Diabetes mellitus without complication- (diet controlled; w/o proteinuria) 04/30/2016   Heavy alcohol use- Many yrs  04/30/2016   Basal cell carcinoma of back 04/30/2016   Obesity 04/30/2016   Hx of multiple concussions 04/30/2016   h/o Vitamin D deficiency 04/30/2016   Screen for sexually transmitted diseases 04/30/2016   Immunocompromised state  ([place]) 04/30/2016   Cervical radiculopathy 01/15/2016   Alcohol withdrawal ([place]) 11/22/2015   Alcohol abuse with alcohol-induced mood disorder ([place]) 11/22/2015   h/o Suicidal thoughts 11/21/2015   Dry mouth 10/07/2015   Anxiety 09/19/2015   OAB (overactive bladder) 07/27/2015   Chronic idiopathic constipation 05/08/2015   Screening for colon cancer    Benign neoplasm of cecum    Benign neoplasm of ascending colon    Benign neoplasm of transverse colon    Benign neoplasm of sigmoid colon    Rectal polyp    Gastroesophageal reflux disease without esophagitis    Esophageal stricture    Joint stiffness of multiple sites 10/17/2014   Benign localized hyperplasia of prostate with urinary obstruction 09/29/2014   concerns for memory loss 09/13/2014   Tuberculosis    Diverticulum of bladder 06/17/2014   Acute urinary retention 05/31/2014   Constipation 04/29/2014   Neuropathy ([place]) 04/11/2014   Post-polio syndrome 01/13/2014   Other emphysema ([place]) 01/13/2014   Hypertension 01/13/2014   Former smoker 01/13/2014   h/o Prostate cancer 01/13/2014   History of tuberculosis 01/13/2014   Bipolar disorder ([place]) 01/13/2014   Explosive personality disorder ([place]) 01/13/2014   Erectile dysfunction 01/13/2014   Nephrolithiasis 01/13/2014   Chronic pain 01/13/2014   Obstructive sleep apnea 49/44/9675   Alcoholic gastritis 91/63/8466   Nocturnal hypoxia 08/10/2013    Past Surgical History:  Procedure Laterality Date   ADENOIDECTOMY     CARDIOVASCULAR STRESS TEST  06-08-2014  dr Mare Ferrari   normal lexiscan study/  no ischemia/  not gated due to PAC's   COLONOSCOPY N/A 05/03/2015   Procedure: COLONOSCOPY;  Surgeon: [name], MD;  Location: [facility];  Service: Endoscopy;  Laterality: N/A;   CYSTOSCOPY N/A 10/25/2015   Procedure: CYSTOSCOPY;  Surgeon: [name], MD;  Location: [facility];  Service: Urology;  Laterality: N/A;   CYSTOSCOPY W/ CYSTOGRAM/  TRANSRECTAL ULTRASOUND PROSTATE BX   03-22-2009   ESOPHAGOGASTRODUODENOSCOPY N/A 03/22/2015   Procedure: ESOPHAGOGASTRODUODENOSCOPY (EGD) with dilation;  Surgeon: [name], MD;  Location: [facility];  Service: Endoscopy;  Laterality: N/A;   excision of skin lesion     LAPAROSCOPIC CHOLECYSTECTOMY  2013   left elbow surgery      due to fracture    NASAL SEPTUM SURGERY  2000   OTHER SURGICAL HISTORY      Muscle & bone Graft/Polio   polio surgeries      14 polio surgeries    PROSTATE BIOPSY N/A 09/28/2014   Procedure: PROSTATE ULTRASOUND/BIOPSY;  Surgeon: [name], MD;  Location: [facility];  Service: Urology;  Laterality: N/A;   PROSTATE BIOPSY N/A 10/25/2015   Procedure: PROSTATE BIOPSY AND ULTRASOUND;  Surgeon: [name], MD;  Location: [facility];  Service: Urology;  Laterality: N/A;   SAVORY DILATION N/A 03/22/2015   Procedure: SAVORY DILATION;  Surgeon: [name], MD;  Location:  [facility];  Service: Endoscopy;  Laterality: N/A;   SHOULDER ARTHROSCOPY WITH OPEN ROTATOR CUFF REPAIR Bilateral 2013  &  1999   removal spurs and bursectomy   TONSILLECTOMY     TRANSURETHRAL INCISION OF BLADDER NECK N/A 10/25/2015   Procedure:  TRANSURETHRAL INCISION OF BLADDER NECK;  Surgeon: [name], MD;  Location: [facility];  Service: Urology;  Laterality: N/A;   TRANSURETHRAL RESECTION OF PROSTATE N/A 09/28/2014   Procedure: TRANSURETHRAL RESECTION OF THE PROSTATE (TURP);  Surgeon: [name], MD;  Location: [facility];  Service: Urology;  Laterality: N/A;   URETEROSOPY STONE EXTRACTION  2000       Home Medications    Prior to Admission medications   Medication Sig Start Date End Date Taking? Authorizing Provider  albuterol (PROAIR HFA) 108 (90 Base) MCG/ACT inhaler Inhale 2 puffs into the lungs every 4 (four) hours as needed for wheezing or shortness of breath. 03/08/21   Valentina Shaggy, MD  albuterol (PROVENTIL) (2.5 MG/3ML) 0.083% nebulizer solution Take 3 mLs (2.5 mg total) by nebulization every 6 (six) hours as needed for  wheezing or shortness of breath. 07/31/21   Valentina Shaggy, MD  arformoterol (BROVANA) 15 MCG/2ML NEBU Take 2 mLs (15 mcg total) by nebulization 2 (two) times daily. Patient not taking: No sig reported 11/03/20   Valentina Shaggy, MD  atorvastatin (LIPITOR) 20 MG tablet Take 20 mg by mouth daily. 03/30/21   [provider]  baclofen (LIORESAL) 10 MG tablet Take 10 mg by mouth daily as needed for muscle spasms. 08/25/19   [provider]  budesonide (PULMICORT) 0.5 MG/2ML nebulizer solution Take 2 mLs (0.5 mg total) by nebulization 2 (two) times daily. Patient not taking: No sig reported 03/27/21   Valentina Shaggy, MD  cetirizine (ZYRTEC) 10 MG tablet Take 1 tablet (10 mg total) by mouth 2 (two) times daily. 04/19/21   Valentina Shaggy, MD  diphenhydrAMINE (SOMINEX) 25 MG tablet Take 25 mg by mouth at bedtime as needed for itching.    [provider]  docusate sodium (COLACE) 100 MG capsule Take 1 capsule (100 mg total) by mouth every 12 (twelve) hours. 04/11/21   Sherwood Gambler, MD  EPINEPHrine (EPIPEN 2-PAK) 0.3 mg/0.3 mL IJ SOAJ injection Inject 0.3 mLs (0.3 mg total) into the muscle as needed for anaphylaxis. 07/03/19   Valentina Shaggy, MD  fluticasone [facility]) 50 MCG/ACT nasal spray 1-2 sprays per nostril daily as needed Patient taking differently: Place 1 spray into both nostrils daily as needed for allergies. 03/08/21   Valentina Shaggy, MD  ipratropium-albuterol (DUONEB) 0.5-2.5 (3) MG/3ML SOLN Take 3 mLs by nebulization every evening. Patient taking differently: Take 3 mLs by nebulization in the morning and at bedtime. 01/21/20   Valentina Shaggy, MD  lactulose (CHRONULAC) 10 GM/15ML solution Take 30 mLs by mouth 3 (three) times daily as needed for mild constipation. 01/16/21   [provider]  Lidocaine 5 % CREA Apply to perianal area as needed for pain. Patient taking differently: Place 1 application around the anus daily as  needed (pain). 10/09/20   Molpus, John, MD  loratadine (CLARITIN) 10 MG tablet Take 1 tablet (10 mg total) by mouth daily as needed for allergies. 03/08/21   Valentina Shaggy, MD  montelukast (SINGULAIR) 10 MG tablet Take 1 tablet (10 mg total) by mouth at bedtime. Patient not taking: No sig reported 03/08/21   Valentina Shaggy, MD  nitroGLYCERIN (NITROSTAT) 0.4 MG SL  tablet Place 1 tablet (0.4 mg total) under the tongue every 5 (five) minutes as needed for chest pain. 06/21/15   Brunetta Jeans, PA-C  ondansetron (ZOFRAN ODT) 4 MG disintegrating tablet Take 1 tablet (4 mg total) by mouth every 8 (eight) hours as needed for nausea or vomiting. 07/06/20   Valentina Shaggy, MD  OXYGEN Inhale 3-4 L into the lungs at bedtime. DURING THE DAY IF NEEDED Reported on 11/25/2015    [provider]  polyethylene glycol (MIRALAX / GLYCOLAX) 17 g packet Take 17 g by mouth daily. 04/11/21   Sherwood Gambler, MD  predniSONE (DELTASONE) 20 MG tablet 2 tabs po daily x 4 days 04/12/21   Sherwood Gambler, MD  Respiratory Therapy Supplies (NEBULIZER/TUBING/MOUTHPIECE) KIT 1 each by Does not apply route as directed. 07/24/19   Valentina Shaggy, MD  Sodium Phosphates (ENEMA RE) Place 1 enema rectally daily as needed (constipation).    [provider]    Family History Family History  Problem Relation Age of Onset   Alzheimer's disease Father 4       Deceased   Stomach cancer Father    Heart attack Father    Heart disease Father    Skin cancer Mother        Facial-Living   Alcohol abuse Sister        x2   Mental illness Sister        x2   Alcohol abuse Sister    Diabetes Maternal Aunt        x2   Thyroid disease Maternal Aunt        x4   Diabetes Maternal Uncle    Tuberculosis Paternal Grandfather    Tuberculosis Paternal Grandmother    Alzheimer's disease Paternal Aunt    Alzheimer's disease Paternal Uncle    Colon cancer Neg Hx    Colon polyps Neg Hx    Crohn's disease  Neg Hx    Ulcerative colitis Neg Hx    Allergic rhinitis Neg Hx    Angioedema Neg Hx    Asthma Neg Hx    Eczema Neg Hx    Urticaria Neg Hx     Social History Social History   Tobacco Use   Smoking status: Former    Types: Pipe    Quit date: 08/25/2013    Years since quitting: 7.9   Smokeless tobacco: Former    Types: Chew   Tobacco comments:    5-6 times smoking a pipe  Vaping Use   Vaping Use: Never used  Substance Use Topics   Alcohol use: Yes   Drug use: No     Allergies   Aspirin, Hornet venom, Ivp dye [iodinated diagnostic agents], Levaquin [levofloxacin in d5w], Nsaids, Penicillins, Tolmetin, Fasenra [benralizumab], Perforomist [formoterol], Buprenorphine hcl, Morphine and related, and Oxycodone   Review of Systems Review of Systems Per HPI  Physical Exam Triage Vital Signs ED Triage Vitals  Enc Vitals Group     BP 08/10/21 1513 (!) 168/76     Pulse Rate 08/10/21 1513 (!) 58     Resp 08/10/21 1513 16     Temp 08/10/21 1513 98 F (36.7 C)     Temp Source 08/10/21 1513 Oral     SpO2 08/10/21 1513 97 %     Weight --      Height --      Head Circumference --      Peak Flow --      Pain Score 08/10/21  1514 5     Pain Loc --      Pain Edu? --      Excl. in [place]? --    No data found.  Updated Vital Signs BP (!) 168/76 (BP Location: Left Arm)   Pulse (!) 58   Temp 98 F (36.7 C) (Oral)   Resp 16   SpO2 97%   Visual Acuity Right Eye Distance:   Left Eye Distance:   Bilateral Distance:    Right Eye Near:   Left Eye Near:    Bilateral Near:     Physical Exam Constitutional:      General: He is not in acute distress.    Appearance: Normal appearance. He is ill-appearing and diaphoretic. He is not toxic-appearing.  HENT:     Head: Normocephalic and atraumatic.  Eyes:     Extraocular Movements: Extraocular movements intact.     Conjunctiva/sclera: Conjunctivae normal.  Cardiovascular:     Rate and Rhythm: Regular rhythm. Bradycardia present.      Pulses: Normal pulses.     Heart sounds: Normal heart sounds.  Pulmonary:     Effort: Pulmonary effort is normal. No respiratory distress.     Breath sounds: Normal breath sounds.  Skin:    General: Skin is warm.  Neurological:     General: No focal deficit present.     Mental Status: He is alert and oriented to person, place, and time. Mental status is at baseline.  Psychiatric:        Mood and Affect: Mood normal.        Behavior: Behavior normal.        Thought Content: Thought content normal.        Judgment: Judgment normal.     UC Treatments / Results  Labs (all labs ordered are listed, but only abnormal results are displayed) Labs Reviewed - No data to display  EKG   Radiology No results found.  Procedures Procedures (including critical care time)  Medications Ordered in UC Medications - No data to display  Initial Impression / Assessment and Plan / UC Course  I have reviewed the triage vital signs and the nursing notes.  Pertinent labs & imaging results that were available during my care of the patient were reviewed by me and considered in my medical decision making (see chart for details).     EKG showing sinus bradycardia.  Advised patient that he will need to go to the hospital for further evaluation and management of chest pain.  Patient was advised that he needs to go by EMS but refused EMS transport.  Patient reports that he will go to the hospital via self transport.  Patient was agreeable to go to the hospital.  Risks associated with not going via EMS were discussed with patient.  Patient voiced understanding and was agreeable. Final Clinical Impressions(s) / UC Diagnoses   Final diagnoses:  Other chest pain     Discharge Instructions      Please go to the emergency department as soon as you leave urgent care for further evaluation and management.     ED Prescriptions   None    PDMP not reviewed this encounter.   Teodora Medici,  [place] 08/10/21 [phone]

## 2021-08-10 NOTE — ED Notes (Signed)
Provider in to speak with patient. Discussed need to be seen in emergency department. Patient agreed to go, but refused ambulance transport.

## 2021-08-10 NOTE — Discharge Instructions (Signed)
Please go to the emergency department as soon as you leave urgent care for further evaluation and management. ?

## 2021-08-10 NOTE — ED Triage Notes (Addendum)
Intermittent chest pain throughout the week. Increasing fatigue. Last night began having chest pain, took a tums, states it eventually went away. When he woke up, chest pain came back again, took two nitros throughout the morning with improvement in chest pain. States he's been sweating, reports dizziness, and feels like the pain occasionally radiates down the left arm. Denies previous MI hx. Reports history of A. Fib.

## 2021-10-31 ENCOUNTER — Telehealth: Payer: Self-pay | Admitting: Allergy & Immunology

## 2021-10-31 NOTE — Telephone Encounter (Signed)
We can send in [place] instead of [place] [place].  That is fine with me.  He needs to reconsider his life choices since he is on the naughty list. This is the reason he did not get a card.   [name], MD [facility] [place] [facility] [place]

## 2021-10-31 NOTE — Telephone Encounter (Signed)
Spoke with pt he is scheduled for next Thursday in [place] for a follow up. Ashleigh sent in albuterol refill pt was wondering if he can do bravonna in place of the yupelri? Being he has then at home? He is also sad we did not send him a christmas card.

## 2021-10-31 NOTE — Telephone Encounter (Signed)
Patient called requesting refills. I advised patient he would need an appointment before any refills could be sent in for him. Last was seen 02/2021. Patient confirmed his insurance is now in network with [name]. Patient then asked if he could substitute medications until he is able to be seen. Patient hung up while I was looking for a nurse for him to speak to (at his request).   Patient would like a call back, [phone]

## 2021-10-31 NOTE — Telephone Encounter (Signed)
Lm for pt to call us back about the brovanna

## 2021-11-01 ENCOUNTER — Other Ambulatory Visit: Payer: Self-pay | Admitting: *Deleted

## 2021-11-01 MED ORDER — ALBUTEROL SULFATE (2.5 MG/3ML) 0.083% IN NEBU: INHALATION_SOLUTION | RESPIRATORY_TRACT | 0 refills | Status: DC

## 2021-11-02 MED ORDER — ARFORMOTEROL TARTRATE 15 MCG/2ML IN NEBU: 15.0000 ug | INHALATION_SOLUTION | Freq: Two times a day (BID) | RESPIRATORY_TRACT | 0 refills | Status: DC

## 2021-11-02 NOTE — Addendum Note (Signed)
Addended by: Clovis Cao A on: 11/02/2021 12:22 PM   Modules accepted: Orders

## 2021-11-09 ENCOUNTER — Encounter: Payer: Self-pay | Admitting: Allergy & Immunology

## 2021-11-09 ENCOUNTER — Other Ambulatory Visit: Payer: Self-pay

## 2021-11-09 ENCOUNTER — Ambulatory Visit (INDEPENDENT_AMBULATORY_CARE_PROVIDER_SITE_OTHER): Payer: Medicare (Managed Care) | Admitting: Allergy & Immunology

## 2021-11-09 VITALS — BP 120/76 | HR 84 | Temp 98.8°F | Resp 20

## 2021-11-09 DIAGNOSIS — J449 Chronic obstructive pulmonary disease, unspecified: Secondary | ICD-10-CM | POA: Diagnosis not present

## 2021-11-09 DIAGNOSIS — K219 Gastro-esophageal reflux disease without esophagitis: Secondary | ICD-10-CM | POA: Diagnosis not present

## 2021-11-09 DIAGNOSIS — T63441D Toxic effect of venom of bees, accidental (unintentional), subsequent encounter: Secondary | ICD-10-CM

## 2021-11-09 DIAGNOSIS — J3089 Other allergic rhinitis: Secondary | ICD-10-CM

## 2021-11-09 DIAGNOSIS — J302 Other seasonal allergic rhinitis: Secondary | ICD-10-CM

## 2021-11-09 MED ORDER — IPRATROPIUM-ALBUTEROL 0.5-2.5 (3) MG/3ML IN SOLN: 3.0000 mL | Freq: Every evening | RESPIRATORY_TRACT | 1 refills | Status: DC

## 2021-11-09 MED ORDER — BUDESONIDE 0.5 MG/2ML IN SUSP: 0.5000 mg | Freq: Two times a day (BID) | RESPIRATORY_TRACT | 1 refills | Status: DC

## 2021-11-09 MED ORDER — ARFORMOTEROL TARTRATE 15 MCG/2ML IN NEBU: 15.0000 ug | INHALATION_SOLUTION | Freq: Two times a day (BID) | RESPIRATORY_TRACT | 1 refills | Status: DC

## 2021-11-09 NOTE — Patient Instructions (Addendum)
1. Severe persistent asthma with COPD overlap - Lung testing looked lower today (in the 40% range), which is lower than where you have been in the past.  - We need to get working on getting your breathing better.  - We are getting labs to see if you qualify for Xolair. - This is something to consider given your reaction.  - We are going to decrease the prednisone to 5mg  today. - Daily controller medication(s): Brovana (aformoterol) 63mcg THREE TIMES DAILY + Pulmicort (budesonide) 0.5 mg nebulizer treatment TWICE DAILY + Yupelri ONE TIME DAILY + montelukast 10 mg ONE TIME DAILY + prednisone 5mg  ONCE DAILY - Prior to physical activity: ProAir 2 puffs 10-15 minutes before physical activity. - Rescue medications: albuterol nebulizer one vial every 4-6 hours as needed or DuoNeb nebulizer one vial every 4-6 hours as needed - Asthma control goals:  * Full participation in all desired activities (may need albuterol before activity) * Albuterol use two time or less a week on average (not counting use with activity) * Cough interfering with sleep two time or less a month * Oral steroids no more  than once a year * No hospitalizations  2. Perennial and seasonal allergic rhinitis - Continue with loratadine 10mg  daily as needed. - Continue with fluticasone 1-2 sprays per nostril daily as needed.   3. GERD - Continue with omeprazole 40mg  daily.  - Monitor Tums usage.   4. Stinging insect anaphylaxis (honeybee, hornets, yellow jacket, wasp) - We can certainly consider venom immunotherapy, but at this point I think we should hold off for now.  - I would like to get your breathing under better control first.   5. Return in about 3 months (around 02/07/2022).    Please inform us of any Emergency Department visits, hospitalizations, or changes in symptoms. Call us before going to the ED for breathing or allergy symptoms since we might be able to fit you in for a sick visit. Feel free to contact us anytime  with any questions, problems, or concerns.  It was a pleasure to talk to you today today!  Websites that have reliable patient information: 1. American Academy of Asthma, Allergy, and Immunology: www.aaaai.org 2. Food Allergy Research and Education (FARE): foodallergy.org 3. Mothers of Asthmatics: http://www.asthmacommunitynetwork.org 4. American College of Allergy, Asthma, and Immunology: www.acaai.org   COVID-19 Vaccine Information can be found at: ShippingScam.co.uk For questions related to vaccine distribution or appointments, please email vaccine@[email or web address] or call [phone].     Like [name] on [place] and Instagram for our latest updates!       Make sure you are registered to vote! If you have moved or changed any of your contact information, you will need to get this updated before voting!  In some cases, you MAY be able to register to vote online: [email or web address]

## 2021-11-09 NOTE — Progress Notes (Signed)
FOLLOW UP  Date of Service/Encounter:  11/09/21   Assessment:   Severe persistent asthma with COPD overlap   Systemic reaction to Berna Bue (priro to my taking over his care)   Systemic reaction versus panic attack to N[place]    Chronic prednisone use (15+ years) - currently on 5mg  daily (with intermittent self-induced bursts up to 20mg  daily)   Seasonal and perennial allergic rhinitis (grasses, weeds, trees, cat, dog, dust mite, and cockroach)   History of non-compliance   Pulmonary nodules (stable per last chest CT June 2019)   Complex medical history, including bipolar depression   GERD - on omeprazole   OSA - with history of non-compliance with CPAP   Stinging insect anaphylaxis - interested in starting venom immunotherapy, although his poor follow up, poor compliance, and poor lung function make this unlikely to happen for now  Plan/Recommendations:   1. Severe persistent asthma with COPD overlap - Lung testing looked lower today (in the 40% range), which is lower than where you have been in the past.  - We need to get working on getting your breathing better.  - We are getting labs to see if you qualify for Xolair. - This is something to consider given your reaction.  - We are going to decrease the prednisone to 5mg  today. - Daily controller medication(s): Brovana (aformoterol) 67mcg THREE TIMES DAILY + Pulmicort (budesonide) 0.5 mg nebulizer treatment TWICE DAILY + Yupelri ONE TIME DAILY + montelukast 10 mg ONE TIME DAILY + prednisone 5mg  ONCE DAILY - Prior to physical activity: ProAir 2 puffs 10-15 minutes before physical activity. - Rescue medications: albuterol nebulizer one vial every 4-6 hours as needed or DuoNeb nebulizer one vial every 4-6 hours as needed - Asthma control goals:  * Full participation in all desired activities (may need albuterol before activity) * Albuterol use two time or less a week on average (not counting use with activity) * Cough  interfering with sleep two time or less a month * Oral steroids no more  than once a year * No hospitalizations  2. Perennial and seasonal allergic rhinitis - Continue with loratadine 10mg  daily as needed. - Continue with fluticasone 1-2 sprays per nostril daily as needed.   3. GERD - Continue with omeprazole 40mg  daily.  - Monitor Tums usage.   4. Stinging insect anaphylaxis (honeybee, hornets, yellow jacket, wasp) - We can certainly consider venom immunotherapy, but at this point I think we should hold off for now.  - I would like to get your breathing under better control first.   5. Return in about 3 months (around 02/07/2022).    Subjective:   [name] is a 71 y.o. male presenting today for follow up of  Chief Complaint  Patient presents with   Follow-up   Medication Refill    [name] has a history of the following: Patient Active Problem List   Diagnosis Date Noted   Acute exacerbation of chronic obstructive pulmonary disease (COPD) ([place]) 04/11/2021   Pneumonia due to COVID-19 virus 07/27/2020   COPD with acute exacerbation ([place]) 12/04/2018   PSVT (paroxysmal supraventricular tachycardia) ([place]) 09/23/2018   Seasonal and perennial allergic rhinitis 07/30/2018   Asthma-COPD overlap syndrome ([place]) 07/07/2018   S/P arthroscopy of right shoulder 04/25/2017   Generalized anxiety disorder 04/22/2017   Severe persistent asthma without complication 01/60/1093   Bee sting-induced anaphylaxis 02/12/2017   Anaphylactic reaction due to food, initial encounter 02/12/2017   Current use of beta blocker  02/12/2017   Allergic rhinitis 01/15/2017   COPD exacerbation ([place]) 11/04/2016   Asthma exacerbation 11/03/2016   Nausea without vomiting 09/17/2016   Shortness of breath    Diabetes mellitus type 2 in obese ([place]) 06/26/2016   non-specific Chest pain 06/12/2016   Patient's noncompliance with other medical treatment and regimen 06/02/2016   Hyperlipidemia, mixed  06/02/2016   Elevated liver enzymes  06/02/2016   Schizophrenia ([place]) 04/30/2016   Environmental and seasonal allergies 04/30/2016   Diabetes mellitus without complication- (diet controlled; w/o proteinuria) 04/30/2016   Heavy alcohol use- Many yrs  04/30/2016   Basal cell carcinoma of back 04/30/2016   Obesity 04/30/2016   Hx of multiple concussions 04/30/2016   h/o Vitamin D deficiency 04/30/2016   Screen for sexually transmitted diseases 04/30/2016   Immunocompromised state ([place]) 04/30/2016   Cervical radiculopathy 01/15/2016   Alcohol withdrawal ([place]) 11/22/2015   Alcohol abuse with alcohol-induced mood disorder ([place]) 11/22/2015   h/o Suicidal thoughts 11/21/2015   Dry mouth 10/07/2015   Anxiety 09/19/2015   OAB (overactive bladder) 07/27/2015   Chronic idiopathic constipation 05/08/2015   Screening for colon cancer    Benign neoplasm of cecum    Benign neoplasm of ascending colon    Benign neoplasm of transverse colon    Benign neoplasm of sigmoid colon    Rectal polyp    Gastroesophageal reflux disease without esophagitis    Esophageal stricture    Joint stiffness of multiple sites 10/17/2014   Benign localized hyperplasia of prostate with urinary obstruction 09/29/2014   concerns for memory loss 09/13/2014   Tuberculosis    Diverticulum of bladder 06/17/2014   Acute urinary retention 05/31/2014   Constipation 04/29/2014   Neuropathy ([place]) 04/11/2014   Post-polio syndrome 01/13/2014   Other emphysema ([place]) 01/13/2014   Hypertension 01/13/2014   Former smoker 01/13/2014   h/o Prostate cancer 01/13/2014   History of tuberculosis 01/13/2014   Bipolar disorder ([place]) 01/13/2014   Explosive personality disorder ([place]) 01/13/2014   Erectile dysfunction 01/13/2014   Nephrolithiasis 01/13/2014   Chronic pain 01/13/2014   Obstructive sleep apnea 32/35/5732   Alcoholic gastritis 20/25/4270   Nocturnal hypoxia 08/10/2013    History obtained from: chart review and  patient.  [name] is a 71 y.o. male presenting for a follow up visit.  He was last seen in May 2022.  He has a history of severe persistent prednisone dependent COPD overlap.  At his last visit, we continued with Brovana 3 times daily as well as Pulmicort twice daily and Yupelri once daily.  We also continued montelukast 1 time daily and prednisone 5 mg daily.  He has been on a number of Biologics, but seems to have panic attacks versus adverse drug reactions to them.  He was interested in starting [place] again, but he never showed up for his appointment to give his injection.  For his rhinitis, would continue with loratadine as well as Flonase.For his GERD, we continue with pantoprazole 40 mg daily.  Since the last visit, he has done fairly well. He is continuing to live with his mother in law, whom he is not very fond of, judging by the way that he talks about her.   Asthma/Respiratory Symptom History: He is followed by PA Duane Boston at [facility]. He does the flutter valve 3 times per week. He just had a bad cold which he has gotten over.  Currently he is using Brovana (AM/PM) and budesonide (AM/PM) and albuterol. He does Yupelri in the  middle of the day. He uses the albuterol for rescue outside and he will use it in the house when he is in trouble and not doing well. He remains on the prednisone 5 mg daily (increases to 10mg  sometimes around twice weekly). He did have a very terrible coughing spell relatively recently in the middle of the night. He used the Electrical engineer and did well with that. He is nervous about starting any kind of biologic.  Allergic Rhinitis Symptom History: He remains on the loratadine 10mg  daily as well as fluticasone. He has not needed antibiotics for any sinus infections in quite some time.   GERD Symptom History: he remains on the omeprazole 40 mg daily. This seems to be working well for the most part. He  has not been overusing his Tums as he was doing in the  past.  He is interested in starting venom immunotherapy. He is outdoors all of the time. He is worried about having a systemic reaction when he is out working in the yard.   Otherwise, there have been no changes to his past medical history, surgical history, family history, or social history.    Review of Systems  Constitutional: Negative.  Negative for chills, fever, malaise/fatigue and weight loss.  HENT:  Positive for congestion. Negative for ear discharge, ear pain and sinus pain.   Eyes:  Negative for pain, discharge and redness.  Respiratory:  Positive for cough and shortness of breath. Negative for sputum production and wheezing.   Cardiovascular: Negative.  Negative for chest pain and palpitations.  Gastrointestinal:  Negative for abdominal pain, constipation, diarrhea, heartburn, nausea and vomiting.  Skin: Negative.  Negative for itching and rash.  Neurological:  Negative for dizziness and headaches.  Endo/Heme/Allergies:  Positive for environmental allergies. Does not bruise/bleed easily.      Objective:   Blood pressure 120/76, pulse 84, temperature 98.8 F (37.1 C), temperature source Temporal, resp. rate 20, SpO2 95 %. There is no height or weight on file to calculate BMI.   Physical Exam:  Physical Exam Vitals reviewed.  Constitutional:      Appearance: He is well-developed.     Comments: In a fairly good mood.   HENT:     Head: Normocephalic and atraumatic.     Right Ear: Tympanic membrane, ear canal and external ear normal.     Left Ear: Tympanic membrane, ear canal and external ear normal.     Nose: No nasal deformity, septal deviation, mucosal edema or rhinorrhea.     Right Turbinates: Enlarged, swollen and pale.     Left Turbinates: Enlarged, swollen and pale.     Right Sinus: No maxillary sinus tenderness or frontal sinus tenderness.     Left Sinus: No maxillary sinus tenderness or frontal sinus tenderness.     Mouth/Throat:     Mouth: Mucous  membranes are not pale and not dry.     Pharynx: Uvula midline.     Comments: Cobblestoning present. Eyes:     General: Lids are normal. No allergic shiner.       Right eye: No discharge.        Left eye: No discharge.     Conjunctiva/sclera: Conjunctivae normal.     Right eye: Right conjunctiva is not injected. No chemosis.    Left eye: Left conjunctiva is not injected. No chemosis.    Pupils: Pupils are equal, round, and reactive to light.  Cardiovascular:     Rate and Rhythm: Normal rate and regular  rhythm.     Heart sounds: Normal heart sounds.  Pulmonary:     Effort: Pulmonary effort is normal. No tachypnea, accessory muscle usage or respiratory distress.     Breath sounds: Examination of the right-upper field reveals wheezing. Examination of the left-upper field reveals wheezing. Examination of the right-middle field reveals wheezing. Examination of the left-middle field reveals wheezing. Wheezing present. No rhonchi or rales.     Comments: Diffuse expiratory wheezes.  Chest:     Chest wall: No tenderness.  Lymphadenopathy:     Cervical: No cervical adenopathy.  Skin:    Coloration: Skin is not pale.     Findings: No abrasion, erythema, petechiae or rash. Rash is not papular, urticarial or vesicular.  Neurological:     Mental Status: He is alert.  Psychiatric:        Behavior: Behavior is cooperative.     Diagnostic studies:    Spirometry: results abnormal (FEV1: 1.30/40%, FVC: 2.11/49%, FEV1/FVC: 62%).    Spirometry consistent with possible restrictive disease. Overall values are stable.   Allergy Studies: none       [name], MD  [facility] and [place] [place]

## 2021-11-12 MED ORDER — BROVANA 15 MCG/2ML IN NEBU: 15.0000 ug | INHALATION_SOLUTION | Freq: Three times a day (TID) | RESPIRATORY_TRACT | 1 refills | Status: DC

## 2021-11-12 MED ORDER — PREDNISONE 5 MG PO TABS: 5.0000 mg | ORAL_TABLET | Freq: Every day | ORAL | 2 refills | Status: DC

## 2021-11-13 ENCOUNTER — Telehealth: Payer: Self-pay | Admitting: *Deleted

## 2021-11-13 LAB — CBC WITH DIFFERENTIAL/PLATELET
Basophils Absolute: 0.1 10*3/uL (ref 0.0–0.2)
Basos: 1 %
EOS (ABSOLUTE): 0.4 10*3/uL (ref 0.0–0.4)
Eos: 5 %
Hematocrit: 45.5 % (ref 37.5–51.0)
Hemoglobin: 15.1 g/dL (ref 13.0–17.7)
Immature Grans (Abs): 0 10*3/uL (ref 0.0–0.1)
Immature Granulocytes: 0 %
Lymphocytes Absolute: 1.5 10*3/uL (ref 0.7–3.1)
Lymphs: 22 %
MCH: 30.8 pg (ref 26.6–33.0)
MCHC: 33.2 g/dL (ref 31.5–35.7)
MCV: 93 fL (ref 79–97)
Monocytes Absolute: 0.5 10*3/uL (ref 0.1–0.9)
Monocytes: 8 %
Neutrophils Absolute: 4.6 10*3/uL (ref 1.4–7.0)
Neutrophils: 64 %
Platelets: 271 10*3/uL (ref 150–450)
RBC: 4.9 x10E6/uL (ref 4.14–5.80)
RDW: 12.9 % (ref 11.6–15.4)
WBC: 7.2 10*3/uL (ref 3.4–10.8)

## 2021-11-13 LAB — IGE: IgE (Immunoglobulin E), Serum: 573 IU/mL — ABNORMAL HIGH (ref 6–495)

## 2021-11-13 LAB — ALPHA-1-ANTITRYPSIN: A-1 Antitrypsin: 129 mg/dL (ref 101–187)

## 2021-11-13 MED ORDER — ARFORMOTEROL TARTRATE 15 MCG/2ML IN NEBU: 15.0000 ug | INHALATION_SOLUTION | Freq: Three times a day (TID) | RESPIRATORY_TRACT | 1 refills | Status: DC

## 2021-11-13 NOTE — Telephone Encounter (Signed)
Medication refill

## 2021-11-16 ENCOUNTER — Telehealth: Payer: Self-pay | Admitting: *Deleted

## 2021-11-16 NOTE — Telephone Encounter (Signed)
Patient called and was reviewed his lab results. Patient verbalized understanding and stated that though he is apprehensive given his reaction history he would be willing to try one more medication that you think would be the best for him. He also stated that he tried taking Singulair again and he was having a hard time swallowing the pill and it was causing him heartburn.

## 2021-11-17 NOTE — Telephone Encounter (Signed)
I looked in his notes and he did receive Dupixent once in the summer of 2021.

## 2021-11-17 NOTE — Telephone Encounter (Signed)
Ok I will route to Tammy to see what she thinks. I do not think that he has ever received Dupixent, so we could try moving forward with this. What do you think, Tammy? We could just use a sample.   Per my previous recommendations: he should double his prednisone to BID and add cetirizine 10mg  BID for four days before AND four days after the injection to prevent any reactions.  [name], MD Allergy and [facility] [place]

## 2021-11-17 NOTE — Telephone Encounter (Signed)
Should we change to [place] then?   Jonathon Marvel, MD Allergy and [place] [facility] [place]

## 2021-11-21 ENCOUNTER — Other Ambulatory Visit: Payer: Medicare (Managed Care) | Admitting: Hospice

## 2021-11-21 ENCOUNTER — Other Ambulatory Visit: Payer: Self-pay

## 2021-11-21 DIAGNOSIS — Z515 Encounter for palliative care: Secondary | ICD-10-CM

## 2021-11-21 DIAGNOSIS — C61 Malignant neoplasm of prostate: Secondary | ICD-10-CM

## 2021-11-21 DIAGNOSIS — J449 Chronic obstructive pulmonary disease, unspecified: Secondary | ICD-10-CM

## 2021-11-21 DIAGNOSIS — G8929 Other chronic pain: Secondary | ICD-10-CM

## 2021-11-21 DIAGNOSIS — M25511 Pain in right shoulder: Secondary | ICD-10-CM

## 2021-11-21 NOTE — Progress Notes (Signed)
° ° °[facility] °Community Palliative Care Consult Note °Telephone: [phone]  °Fax: [phone] ° °PATIENT NAME: [name] °DOB: 05/04/1951 °MRN: [id number] ° °PRIMARY CARE PROVIDER:  Akins, Catherine °[facility] ° °REFERRING PROVIDER: Maier, Andrew C, PA-C °Maier, Andrew C, PA-C °[address] °[address] °[place],  [place] [address] °RESPONSIBLE PARTY: Self [phone] c °Emergency contact: Wanda Jaskulski [phone] °Contact Information   ° ° Name Relation Home Work Mobile  ° Livas,Wanda Spouse [phone]  [phone]  ° Taylor,Jeanette Relative [phone]    ° Rossi,Kathleen Mother [phone]    ° °  ° °TELEHEALTH VISIT STATEMENT °Due to the COVID-19 crisis, this visit was done via telemedicine and it was initiated and consent by this patient and or family. Video-audio (telehealth) contact was unable to be done due to technical barriers from the patient’s side.  ° °Visit is to build trust and highlight Palliative Medicine as specialized medical care for people living with serious illness, aimed at facilitating better quality of life through symptoms relief, assisting with advance care planning and complex medical decision making. Spouse is with patient during visit. This is a follow up visit. ° °RECOMMENDATIONS/PLAN:  ° °Advance Care Planning/Code Status: Patient is a Full code ° °Goals of Care: Goals of care include to maximize quality of life and symptom management. ° °Visit consisted of counseling and education dealing with the complex and emotionally intense issues of symptom management and palliative care in the setting of serious and potentially life-threatening illness.  Palliative care team will continue to support patient, patient's family, and medical team. ° °Symptom management/Plan:  °COPD/Asthma: Patient is followed by Pulmonologist and Allerginist. Continue Prednisone, breathing treatments and oxygen supplementation 2-3L/Min as ordered.  °Right  Shoulder pain: S/p arthroscopy; followed by Ortho; plan is for steroid shots. Follow up 10/30/21.  °Bil Knee pain: Continue Tylenol and  Baclofen as ordered; effective. °Prostate CA: No chemo/radiation. Followed by Oncologist for surveillance. ° °Follow up: Palliative care will continue to follow for complex medical decision making, advance care planning, and clarification of goals. Return 6 weeks or prn. Encouraged to call provider sooner with any concerns. ° °CHIEF COMPLAINT: Palliative follow up ° °HISTORY OF PRESENT ILLNESS:  [name] a 71 y.o. male with multiple medical problems including history COPD/asthma with O2 dependency, Tuberculosis, Prostate Cancer, severe allergies, constipation, anxiety and schizophrenia. Patient he is sometimes not compliant with using his oxygen and this results in hypoxia. Education provided on the need to use oxygen supplementation as ordered. History obtained from review of EMR, discussion with primary team, family and/or patient. Records reviewed and summarized above. All 10 point systems reviewed and are negative except as documented in history of present illness above ° °Review and summarization of Epic records shows history from other than patient.  ° °Palliative Care was asked to follow this patient o help address complex decision making in the context of advance care planning and goals of care clarification.  ° °PERTINENT MEDICATIONS:  °Outpatient Encounter Medications as of 11/21/2021  °Medication Sig  ° albuterol (PROAIR HFA) 108 (90 Base) MCG/ACT inhaler Inhale 2 puffs into the lungs every 4 (four) hours as needed for wheezing or shortness of breath.  ° albuterol (PROVENTIL) (2.5 MG/3ML) 0.083% nebulizer solution USE 1 VIAL IN NEBULIZER EVERY 6 HOURS AS NEEDED FOR WHEEZING FOR SHORTNESS OF BREATH  ° arformoterol (BROVANA) 15 MCG/2ML NEBU Take 2 mLs (15 mcg total) by nebulization 3 (three) times daily.  °   atorvastatin (LIPITOR) 20 MG tablet Take 20 mg by mouth  daily.   BROVANA 15 MCG/2ML NEBU Take 2 mLs (15 mcg total) by nebulization in the morning, at noon, and at bedtime.   budesonide (PULMICORT) 0.5 MG/2ML nebulizer solution Take 2 mLs (0.5 mg total) by nebulization 2 (two) times daily.   cetirizine (ZYRTEC) 10 MG tablet Take 1 tablet (10 mg total) by mouth 2 (two) times daily.   diphenhydrAMINE (SOMINEX) 25 MG tablet Take 25 mg by mouth at bedtime as needed for itching.   docusate sodium (COLACE) 100 MG capsule Take 1 capsule (100 mg total) by mouth every 12 (twelve) hours.   EPINEPHrine (EPIPEN 2-PAK) 0.3 mg/0.3 mL IJ SOAJ injection Inject 0.3 mLs (0.3 mg total) into the muscle as needed for anaphylaxis.   fluticasone (FLONASE) 50 MCG/ACT nasal spray 1-2 sprays per nostril daily as needed (Patient taking differently: Place 1 spray into both nostrils daily as needed for allergies.)   ipratropium-albuterol (DUONEB) 0.5-2.5 (3) MG/3ML SOLN Take 3 mLs by nebulization every evening.   lactulose (CHRONULAC) 10 GM/15ML solution Take 30 mLs by mouth 3 (three) times daily as needed for mild constipation.   Lidocaine 5 % CREA Apply to perianal area as needed for pain. (Patient taking differently: Place 1 application around the anus daily as needed (pain).)   loratadine (CLARITIN) 10 MG tablet Take 1 tablet (10 mg total) by mouth daily as needed for allergies.   nitroGLYCERIN (NITROSTAT) 0.4 MG SL tablet Place 1 tablet (0.4 mg total) under the tongue every 5 (five) minutes as needed for chest pain.   ondansetron (ZOFRAN ODT) 4 MG disintegrating tablet Take 1 tablet (4 mg total) by mouth every 8 (eight) hours as needed for nausea or vomiting.   OXYGEN Inhale 3-4 L into the lungs at bedtime. DURING THE DAY IF NEEDED Reported on 11/25/2015   polyethylene glycol (MIRALAX / GLYCOLAX) 17 g packet Take 17 g by mouth daily.   predniSONE (DELTASONE) 20 MG tablet 2 tabs po daily x 4 days   predniSONE (DELTASONE) 5 MG tablet Take 1 tablet (5 mg total) by mouth daily with  breakfast.   Respiratory Therapy Supplies (NEBULIZER/TUBING/MOUTHPIECE) KIT 1 each by Does not apply route as directed.   Sodium Phosphates (ENEMA RE) Place 1 enema rectally daily as needed (constipation).   No facility-administered encounter medications on file as of 11/21/2021.    HOSPICE ELIGIBILITY/DIAGNOSIS: TBD  PAST MEDICAL HISTORY:  Past Medical History:  Diagnosis Date   Anxiety disorder    Arthritis    BILATERAL SHOULDERS, ELBOWS AND HANDS AND LEFT HIP AND KNEES--HX CORTISONE SHOTS IN SHOULDERS, ELBOWS, HIP AND KNEES   Asthma    Basal cell carcinoma of back 04/30/2016   Dermatologist- Dr Nevada Crane;   MOHS sx- Dr Levada Dy    Bladder outlet obstruction    Chronic idiopathic constipation 96/29/5284   Complication of anesthesia    DIFFICULT WAKING    COPD (chronic obstructive pulmonary disease) [place] Frequent exacerbations 01/13/2014   [name] 07/08/14: FeV1 51% FeV1/FVC 66% FVC 59% 10/5/2015ONO RA was normal  10/13/2014  ONO on RA NORMAL    Depression    Diabetes mellitus without complication ([place])    BODERLINE - DIET CONTROL   Former smoker 01/13/2014   Gastroesophageal reflux disease without esophagitis    Heavy alcohol use 04/30/2016   History of oxygen administration    oxygen use 2 l/m nasally at bedtime and exertional occasions   History of rheumatic fever  Hx of multiple concussions    x 2 per patient    Hypertension    Melanoma ([place])    Nocturnal oxygen desaturation    USES O2 NIGHTLY   OSA (obstructive sleep apnea) 01/13/2014   PONV (postoperative nausea and vomiting)    Post-polio syndrome    polio at age 27--PT WAS IN IRON LUNG; PT WAS IN W/C UNTIL AGE 80; STILL HAS WEAKNESS RIGHT SIDE   Prostate cancer ([place])    Schizophrenia ([place])    Tuberculosis 1984   Hosp 4 months rx , left early    Urticaria      ALLERGIES:  Allergies  Allergen Reactions   Aspirin Anaphylaxis   [place] Venom Anaphylaxis   Ivp Dye [Iodinated Contrast Media] Anaphylaxis   Levaquin  [Levofloxacin In D5w] Shortness Of Breath and Swelling    In addition: sweating, chest pain, and diarrhea.    Nsaids Anaphylaxis   Penicillins Anaphylaxis    Heart stops Has patient had a PCN reaction causing immediate rash, facial/tongue/throat swelling, SOB or lightheadedness with hypotension: yes Has patient had a PCN reaction causing severe rash involving mucus membranes or skin necrosis: NO Has patient had a PCN reaction that required hospitalization yes Has patient had a PCN reaction occurring within the last 10 years: No If all of the above answers are "NO", then may proceed with Cephalosporin use.    Tolmetin Anaphylaxis   Fasenra [Benralizumab] Hives   Perforomist [Formoterol]     Increased wheezing, shortness of breath   Buprenorphine Hcl Nausea And Vomiting    Can take with zofran    Morphine And Related Nausea And Vomiting    Can take with zofran    Oxycodone Itching and Rash      I spent 44 minutes providing this consultation; this includes time spent with patient/family, chart review and documentation. More than 50% of the time in this consultation was spent on counseling and coordinating communication   Thank you for the opportunity to participate in the care of ANDER WAMSER Please call our office at [phone] if we can be of additional assistance.  Note: Portions of this note were generated with Lobbyist. Dictation errors may occur despite best attempts at proofreading.  Teodoro Spray, NP

## 2021-11-24 NOTE — Telephone Encounter (Signed)
Thank you for taking such good care of our patients, Tammy!  [name], MD Allergy and [place] [place]

## 2021-11-24 NOTE — Telephone Encounter (Signed)
Called patient and advised approval and submit to [facility] and will reach out once delivery set up to make appt to start therapy

## 2021-12-02 ENCOUNTER — Other Ambulatory Visit: Payer: Self-pay

## 2021-12-02 ENCOUNTER — Emergency Department (HOSPITAL_COMMUNITY): Payer: Medicare (Managed Care)

## 2021-12-02 ENCOUNTER — Emergency Department (HOSPITAL_COMMUNITY)
Admission: EM | Admit: 2021-12-02 | Discharge: 2021-12-03 | Disposition: A | Discharge status: Home / Self Care | Payer: Medicare (Managed Care) | Attending: Emergency Medicine | Admitting: Emergency Medicine

## 2021-12-02 ENCOUNTER — Encounter (HOSPITAL_COMMUNITY): Payer: Self-pay

## 2021-12-02 DIAGNOSIS — E119 Type 2 diabetes mellitus without complications: Secondary | ICD-10-CM | POA: Insufficient documentation

## 2021-12-02 DIAGNOSIS — R0789 Other chest pain: Secondary | ICD-10-CM | POA: Diagnosis present

## 2021-12-02 DIAGNOSIS — I10 Essential (primary) hypertension: Secondary | ICD-10-CM | POA: Insufficient documentation

## 2021-12-02 DIAGNOSIS — Z8546 Personal history of malignant neoplasm of prostate: Secondary | ICD-10-CM | POA: Diagnosis not present

## 2021-12-02 DIAGNOSIS — J441 Chronic obstructive pulmonary disease with (acute) exacerbation: Secondary | ICD-10-CM | POA: Diagnosis not present

## 2021-12-02 DIAGNOSIS — Z79899 Other long term (current) drug therapy: Secondary | ICD-10-CM | POA: Diagnosis not present

## 2021-12-02 DIAGNOSIS — J45909 Unspecified asthma, uncomplicated: Secondary | ICD-10-CM | POA: Diagnosis not present

## 2021-12-02 LAB — BASIC METABOLIC PANEL
Anion gap: 12 (ref 5–15)
BUN: 12 mg/dL (ref 8–23)
CO2: 19 mmol/L — ABNORMAL LOW (ref 22–32)
Calcium: 9 mg/dL (ref 8.9–10.3)
Chloride: 101 mmol/L (ref 98–111)
Creatinine, Ser: 0.97 mg/dL (ref 0.61–1.24)
GFR, Estimated: >60 mL/min (ref >60)
Glucose, Bld: 124 mg/dL — ABNORMAL HIGH (ref 70–99)
Potassium: 4.3 mmol/L (ref 3.5–5.1)
Sodium: 132 mmol/L — ABNORMAL LOW (ref 135–145)

## 2021-12-02 LAB — CBC
HCT: 43.2 % (ref 39.0–52.0)
Hemoglobin: 14.4 g/dL (ref 13.0–17.0)
MCH: 31.8 pg (ref 26.0–34.0)
MCHC: 33.3 g/dL (ref 30.0–36.0)
MCV: 95.4 fL (ref 80.0–100.0)
Platelets: 277 10*3/uL (ref 150–400)
RBC: 4.53 MIL/uL (ref 4.22–5.81)
RDW: 13.2 % (ref 11.5–15.5)
WBC: 9.7 10*3/uL (ref 4.0–10.5)
nRBC: 0 % (ref 0.0–0.2)

## 2021-12-02 LAB — TROPONIN I (HIGH SENSITIVITY): Troponin I (High Sensitivity): 7 ng/L (ref <18)

## 2021-12-02 NOTE — ED Triage Notes (Addendum)
Arrives EMS from home with left sided chest pains beginning around 1930 after using the restroom. 3 nitroglycerine SL, and 4mg  zZofran IVP prior to arrival.

## 2021-12-02 NOTE — ED Provider Notes (Signed)
Emergency Department Provider Note   I have reviewed the triage vital signs and the nursing notes.   HISTORY  Chief Complaint Chest Pain   HPI [name] is a 71 y.o. male with past medical history reviewed below presents emergency department with acute onset chest discomfort.  Patient states he was going to the bathroom after feeling the need to have a bowel movement when he developed central pressure/tightness in the chest.  He notes that he is currently diagnosed with prostate cancer but not currently undergoing treatment.  Denies any fevers.  He has baseline bright red blood per rectum that is unchanged.  He also has baseline home oxygen requirement and frequently has shortness of breath episodes.     Past Medical History:  Diagnosis Date   Anxiety disorder    Arthritis    BILATERAL SHOULDERS, ELBOWS AND HANDS AND LEFT HIP AND KNEES--HX CORTISONE SHOTS IN SHOULDERS, ELBOWS, HIP AND KNEES   Asthma    Basal cell carcinoma of back 04/30/2016   Dermatologist- Dr Nevada Crane;   MOHS sx- Dr Levada Dy    Bladder outlet obstruction    Chronic idiopathic constipation 19/14/7829   Complication of anesthesia    DIFFICULT WAKING    COPD (chronic obstructive pulmonary disease) Gold C Frequent exacerbations 01/13/2014   [name] 07/08/14: FeV1 51% FeV1/FVC 66% FVC 59% 10/5/2015ONO RA was normal  10/13/2014  ONO on RA NORMAL    Depression    Diabetes mellitus without complication ([place])    BODERLINE - DIET CONTROL   Former smoker 01/13/2014   Gastroesophageal reflux disease without esophagitis    Heavy alcohol use 04/30/2016   History of oxygen administration    oxygen use 2 l/m nasally at bedtime and exertional occasions   History of rheumatic fever    Hx of multiple concussions    x 2 per patient    Hypertension    Melanoma ([place])    Nocturnal oxygen desaturation    USES O2 NIGHTLY   OSA (obstructive sleep apnea) 01/13/2014   PONV (postoperative nausea and vomiting)    Post-polio  syndrome    polio at age 1--PT WAS IN IRON LUNG; PT WAS IN W/C UNTIL AGE 88; STILL HAS WEAKNESS RIGHT SIDE   Prostate cancer ([place])    Schizophrenia ([place])    Tuberculosis 1984   Hosp 4 months rx , left early    Urticaria     Review of Systems  Constitutional: No fever/chills Eyes: No visual changes. ENT: No sore throat. Cardiovascular: Positive chest pain. Respiratory: Denies shortness of breath. Gastrointestinal: No abdominal pain.  No nausea, no vomiting.  No diarrhea.  No constipation. Genitourinary: Negative for dysuria. Musculoskeletal: Negative for back pain. Skin: Negative for rash. Neurological: Negative for headaches, focal weakness or numbness.   ____________________________________________   PHYSICAL EXAM:  VITAL SIGNS: ED Triage Vitals  Enc Vitals Group     BP 12/02/21 2115 (!) 154/82     Pulse Rate 12/02/21 2115 71     Resp 12/02/21 2115 (!) 27     Temp 12/02/21 2148 98.2 F (36.8 C)     Temp src --      SpO2 12/02/21 2102 98 %     Weight 12/02/21 2103 231 lb (104.8 kg)     Height 12/02/21 2103 6' (1.829 m)   Constitutional: Alert and oriented. Well appearing and in no acute distress. Eyes: Conjunctivae are normal.  Head: Atraumatic. Nose: No congestion/rhinnorhea. Mouth/Throat: Mucous membranes are moist.  Neck: No  stridor.  Cardiovascular: Normal rate, regular rhythm. Good peripheral circulation. Grossly normal heart sounds.   Respiratory: Normal respiratory effort.  No retractions. Lungs CTAB. Gastrointestinal: Soft and nontender. No distention.  Musculoskeletal: No lower extremity tenderness nor edema. No gross deformities of extremities. Neurologic:  Normal speech and language. No gross focal neurologic deficits are appreciated.  Skin:  Skin is warm, dry and intact. No rash noted.   ____________________________________________   LABS (all labs ordered are listed, but only abnormal results are displayed)  Labs Reviewed  BASIC METABOLIC  PANEL - Abnormal; Notable for the following components:      Result Value   Sodium 132 (*)    CO2 19 (*)    Glucose, Bld 124 (*)    All other components within normal limits  CBC  TROPONIN I (HIGH SENSITIVITY)  TROPONIN I (HIGH SENSITIVITY)   ____________________________________________  EKG   EKG Interpretation  Date/Time:  Saturday December 02 2021 21:06:11 EST Ventricular Rate:  74 PR Interval:  172 QRS Duration: 88 QT Interval:  369 QTC Calculation: 410 R Axis:   106 Text Interpretation: Sinus rhythm Consider right ventricular hypertrophy Confirmed by Nanda Quinton [phone]) on 12/02/2021 9:48:24 PM        ____________________________________________  RADIOLOGY  DG Chest Portable 1 View  Result Date: 12/02/2021 CLINICAL DATA:  Chest pain and shortness of breath. EXAM: PORTABLE CHEST 1 VIEW COMPARISON:  April 03, 2021 FINDINGS: The heart size and mediastinal contours are within normal limits. Mild, stable atelectasis is seen within the bilateral lung bases. There is no evidence of a pleural effusion or pneumothorax. The visualized skeletal structures are unremarkable. IMPRESSION: Mild bibasilar atelectasis. Electronically Signed   By: Virgina Norfolk M.D.   On: 12/02/2021 21:22    ____________________________________________   PROCEDURES  Procedure(s) performed:   Procedures  None ____________________________________________   INITIAL IMPRESSION / ASSESSMENT AND PLAN / ED COURSE  Pertinent labs & imaging results that were available during my care of the patient were reviewed by me and considered in my medical decision making (see chart for details).   This patient is Presenting for Evaluation of CP, which does require a range of treatment options, and is a complaint that involves a high risk of morbidity and mortality.  The Differential Diagnoses includes all life-threatening causes for chest pain. This includes but is not exclusive to acute coronary syndrome,  aortic dissection, pulmonary embolism, cardiac tamponade, community-acquired pneumonia, pericarditis, musculoskeletal chest wall pain, etc.   I decided to review pertinent External Data, and in summary Myoview stress and ECHO from 2017 reviewed.    Clinical Laboratory Tests Ordered, included Troponin negative x 1. Normal renal function. No significant electrolyte abnormality.   Radiologic Tests Ordered, included CXR. I independently interpreted the images and agree with radiology interpretation.   Cardiac Monitor Tracing which shows NSR.   Social Determinants of Health Risk patient is a former smoker.   Medical Decision Making: Summary:  Patient presents to the ED with atypical CP. Considered PE but vitals are WNL and patient's pain is somewhat atypical for PE. He has an anaphylactic reaction to contrast dye. Had risk/benefit discussion with patient and will defer further labs or imaging to evaluate for PE. ACS is also a consideration but EKG and initial labs and EKG are reassuring. Second troponin pending. Care transferred to Dr. Dina Rich pending troponin.   Reevaluation with update and discussion with patient who is feeling well, pain free, and in agreement with plan.  Disposition: pending  ____________________________________________  FINAL CLINICAL IMPRESSION(S) / ED DIAGNOSES  Final diagnoses:  Atypical chest pain     Note:  This document was prepared using Dragon voice recognition software and may include unintentional dictation errors.  Nanda Quinton, MD, [facility] Emergency Medicine    Leanor Voris, Wonda Olds, MD 12/05/21 [phone]

## 2021-12-03 LAB — TROPONIN I (HIGH SENSITIVITY): Troponin I (High Sensitivity): 7 ng/L (ref <18)

## 2021-12-03 NOTE — ED Provider Notes (Signed)
Patient signed out repeat troponin.  Repeat troponin 7 and flat.  Per discussion with Dr. Laverta Baltimore, patient wished to be discharged home.  He was given return precautions and follow-up information.  Physical Exam  BP (!) 145/84    Pulse 72    Temp 98.2 F (36.8 C)    Resp 17    Ht 1.829 m (6')    Wt 104.8 kg    SpO2 100%    BMI 31.33 kg/m    ED Course / MDM    Medical Decision Making Amount and/or Complexity of Data Reviewed Labs: ordered. Radiology: ordered.   Problem List Items Addressed This Visit   None Visit Diagnoses     Atypical chest pain    -  Primary             Zainah Steven, Barbette Hair, MD 12/03/21 [phone]

## 2021-12-03 NOTE — Discharge Instructions (Signed)
You were seen today for chest pain.  Your work-up is reassuring including heart testing.  Follow-up with your primary physician and cardiology.

## 2021-12-04 NOTE — Progress Notes (Deleted)
Synopsis: Referred for ACOS by [name],*  Subjective:   PATIENT ID: [name] GENDER: male DOB: 1951/01/25, MRN: [id number]  No chief complaint on file.  5yM with history of AR, OCS dependent (77m daily, intermittent self-managed bursts of 272mdaily) ACOS last FEV1 40% 10/2021 (ratio 62%) followed by [name] and Dr. [name] allergy, chronic hypoxemic respiratory failure on 2L O2 with exertion and sleep, seen by palliative care, post polio syndrome previously on iron lung, GERD with peptic stricture s/p dilation 2016, melanoma, TB, schizophrenia, urticaria referred for ACOS. He is on brovana TID, yupelri daily, pulmicort, singulair 10 daily, duonebs prn, zyrtec  Went to ED 2/18 for atypical CP, flat trops.   Systemic reaction to fasenra, systemic reaction vs panic attack with nucala per [name]. Has tried dupixent once in 2021. Looks like he's being set up for tezspire with allergy.  Otherwise pertinent review of systems is negative.  Past Medical History:  Diagnosis Date   Anxiety disorder    Arthritis    BILATERAL SHOULDERS, ELBOWS AND HANDS AND LEFT HIP AND KNEES--HX CORTISONE SHOTS IN SHOULDERS, ELBOWS, HIP AND KNEES   Asthma    Basal cell carcinoma of back 04/30/2016   Dermatologist- [name]  MOHS sx- [name]  Bladder outlet obstruction    Chronic idiopathic constipation [id number] Complication of anesthesia    DIFFICULT WAKING    COPD (chronic obstructive pulmonary disease) Gold C Frequent exacerbations 01/13/2014   SpArlyce Harman/24/15: FeV1 51% FeV1/FVC 66% FVC 59% 10/5/2015ONO RA was normal  10/13/2014  ONO on RA NORMAL    Depression    Diabetes mellitus without complication ([place]   BODERLINE - DIET CONTROL   Former smoker 01/13/2014   Gastroesophageal reflux disease without esophagitis    Heavy alcohol use 04/30/2016   History of oxygen administration    oxygen use 2 l/m nasally at bedtime and exertional occasions    History of rheumatic fever    Hx of multiple concussions    x 2 per patient    Hypertension    [place]   Nocturnal oxygen desaturation    USES O2 NIGHTLY   OSA (obstructive sleep apnea) 01/13/2014   PONV (postoperative nausea and vomiting)    Post-polio syndrome    polio at age 65--PT WAS IN IRON LUNG; PT WAS IN W/C UNTIL AGE 27; STILL HAS WEAKNESS RIGHT SIDE   [place] cancer [place]   [place]   Tuberculosis 1984   Hosp 4 months rx , left early    Urticaria      Family History  Problem Relation Age of Onset   Alzheimer's disease Father 4       Deceased   Stomach cancer Father    Heart attack Father    Heart disease Father    Skin cancer Mother        Facial-Living   Alcohol abuse Sister        x2   Mental illness Sister        x2   Alcohol abuse Sister    Diabetes Maternal Aunt        x2   Thyroid disease Maternal Aunt        x4   Diabetes Maternal Uncle    Tuberculosis Paternal Grandfather    Tuberculosis Paternal Grandmother    Alzheimer's disease Paternal Aunt    Alzheimer's disease Paternal Uncle    Colon  cancer Neg Hx    Colon polyps Neg Hx    Crohn's disease Neg Hx    Ulcerative colitis Neg Hx    Allergic rhinitis Neg Hx    Angioedema Neg Hx    Asthma Neg Hx    Eczema Neg Hx    Urticaria Neg Hx      Past Surgical History:  Procedure Laterality Date   ADENOIDECTOMY     CARDIOVASCULAR STRESS TEST  06-08-2014  dr Mare Ferrari   normal lexiscan study/  no ischemia/  not gated due to PAC's   COLONOSCOPY N/A 05/03/2015   Procedure: COLONOSCOPY;  Surgeon: [name], MD;  Location: [facility];  Service: Endoscopy;  Laterality: N/A;   CYSTOSCOPY N/A 10/25/2015   Procedure: CYSTOSCOPY;  Surgeon: [name], MD;  Location: [facility];  Service: Urology;  Laterality: N/A;   CYSTOSCOPY W/ CYSTOGRAM/  TRANSRECTAL ULTRASOUND PROSTATE BX  03-22-2009   ESOPHAGOGASTRODUODENOSCOPY N/A 03/22/2015   Procedure: ESOPHAGOGASTRODUODENOSCOPY (EGD) with  dilation;  Surgeon: [name], MD;  Location: [facility];  Service: Endoscopy;  Laterality: N/A;   excision of skin lesion     LAPAROSCOPIC CHOLECYSTECTOMY  2013   left elbow surgery      due to fracture    NASAL SEPTUM SURGERY  2000   OTHER SURGICAL HISTORY      Muscle & bone Graft/Polio   polio surgeries      14 polio surgeries    PROSTATE BIOPSY N/A 09/28/2014   Procedure: PROSTATE ULTRASOUND/BIOPSY;  Surgeon: [name], MD;  Location: [facility];  Service: Urology;  Laterality: N/A;   PROSTATE BIOPSY N/A 10/25/2015   Procedure: PROSTATE BIOPSY AND ULTRASOUND;  Surgeon: [name], MD;  Location: [facility];  Service: Urology;  Laterality: N/A;   SAVORY DILATION N/A 03/22/2015   Procedure: SAVORY DILATION;  Surgeon: [name], MD;  Location: [facility];  Service: Endoscopy;  Laterality: N/A;   SHOULDER ARTHROSCOPY WITH OPEN ROTATOR CUFF REPAIR Bilateral 2013  &  1999   removal spurs and bursectomy   TONSILLECTOMY     TRANSURETHRAL INCISION OF BLADDER NECK N/A 10/25/2015   Procedure:  TRANSURETHRAL INCISION OF BLADDER NECK;  Surgeon: [name], MD;  [facility];  Service: Urology;  Laterality: N/A;   TRANSURETHRAL RESECTION OF PROSTATE N/A 09/28/2014   Procedure: TRANSURETHRAL RESECTION OF THE PROSTATE (TURP);  Surgeon: [name], MD;  Location[facility];  Service: Urology;  Laterality: N/A;   URETEROSOPY STONE EXTRACTION  2000    Social History   Socioeconomic History   Marital status: Married    Spouse name: Not on file   Number of children: Not on file   Years of education: Not on file   Highest education level: Not on file  Occupational History   Occupation: Retired  Tobacco Use   Smoking status: Former    Types: Pipe    Quit date: 08/25/2013    Years since quitting: 8.2   Smokeless tobacco: Former    Types: Chew   Tobacco comments:    5-6 times smoking a pipe  Vaping Use   Vaping Use: Never used  Substance and Sexual Activity   Alcohol use: Yes   Drug  use: No   Sexual activity: Yes    Birth control/protection: None  Other Topics Concern   Not on file  Social History Narrative   Not on file   Social Determinants of Health   Financial Resource Strain: Not on file  Food  Insecurity: Not on file  Transportation Needs: Not on file  Physical Activity: Not on file  Stress: Not on file  Social Connections: Not on file  Intimate Partner Violence: Not on file     Allergies  Allergen Reactions   Aspirin Anaphylaxis and Other (See Comments)   [place] Venom Anaphylaxis   Iodinated Contrast Media Anaphylaxis and Other (See Comments)   Levaquin [Levofloxacin In D5w] Shortness Of Breath and Swelling    In addition: sweating, chest pain, and diarrhea.    Nsaids Anaphylaxis and Other (See Comments)   Penicillins Anaphylaxis    Heart stops Has patient had a PCN reaction causing immediate rash, facial/tongue/throat swelling, SOB or lightheadedness with hypotension: yes Has patient had a PCN reaction causing severe rash involving mucus membranes or skin necrosis: NO Has patient had a PCN reaction that required hospitalization yes Has patient had a PCN reaction occurring within the last 10 years: No If all of the above answers are "NO", then may proceed with Cephalosporin use.    Tolmetin Anaphylaxis   Fasenra [Benralizumab] Hives   Perforomist [Formoterol]     Increased wheezing, shortness of breath   Buprenorphine Hcl Nausea And Vomiting    Can take with zofran    Morphine And Related Nausea And Vomiting    Can take with zofran    Oxycodone Itching and Rash     Outpatient Medications Prior to Visit  Medication Sig Dispense Refill   albuterol (PROAIR HFA) 108 (90 Base) MCG/ACT inhaler Inhale 2 puffs into the lungs every 4 (four) hours as needed for wheezing or shortness of breath. 1 each 1   albuterol (PROVENTIL) (2.5 MG/3ML) 0.083% nebulizer solution USE 1 VIAL IN NEBULIZER EVERY 6 HOURS AS NEEDED FOR WHEEZING FOR SHORTNESS OF BREATH 75  mL 0   arformoterol (BROVANA) 15 MCG/2ML NEBU Take 2 mLs (15 mcg total) by nebulization 3 (three) times daily. 540 mL 1   atorvastatin (LIPITOR) 20 MG tablet Take 20 mg by mouth daily.     BROVANA 15 MCG/2ML NEBU Take 2 mLs (15 mcg total) by nebulization in the morning, at noon, and at bedtime. 540 mL 1   budesonide (PULMICORT) 0.5 MG/2ML nebulizer solution Take 2 mLs (0.5 mg total) by nebulization 2 (two) times daily. 360 mL 1   cetirizine (ZYRTEC) 10 MG tablet Take 1 tablet (10 mg total) by mouth 2 (two) times daily. 60 tablet 2   diphenhydrAMINE (SOMINEX) 25 MG tablet Take 25 mg by mouth at bedtime as needed for itching.     docusate sodium (COLACE) 100 MG capsule Take 1 capsule (100 mg total) by mouth every 12 (twelve) hours. 60 capsule 0   EPINEPHrine (EPIPEN 2-PAK) 0.3 mg/0.3 mL IJ SOAJ injection Inject 0.3 mLs (0.3 mg total) into the muscle as needed for anaphylaxis. 1 each 2   fluticasone (FLONASE) 50 MCG/ACT nasal spray 1-2 sprays per nostril daily as needed (Patient taking differently: Place 1 spray into both nostrils daily as needed for allergies.) 16 g 5   ipratropium-albuterol (DUONEB) 0.5-2.5 (3) MG/3ML SOLN Take 3 mLs by nebulization every evening. 360 mL 1   lactulose (CHRONULAC) 10 GM/15ML solution Take 30 mLs by mouth 3 (three) times daily as needed for mild constipation.     Lidocaine 5 % CREA Apply to perianal area as needed for pain. (Patient taking differently: Place 1 application around the anus daily as needed (pain).) 30 g 0   loratadine (CLARITIN) 10 MG tablet Take 1 tablet (10 mg  total) by mouth daily as needed for allergies. 30 tablet 5   nitroGLYCERIN (NITROSTAT) 0.4 MG SL tablet Place 1 tablet (0.4 mg total) under the tongue every 5 (five) minutes as needed for chest pain. 30 tablet 3   ondansetron (ZOFRAN ODT) 4 MG disintegrating tablet Take 1 tablet (4 mg total) by mouth every 8 (eight) hours as needed for nausea or vomiting. 30 tablet 1   OXYGEN Inhale 3-4 L into the  lungs at bedtime. DURING THE DAY IF NEEDED Reported on 11/25/2015     polyethylene glycol (MIRALAX / GLYCOLAX) 17 g packet Take 17 g by mouth daily. 14 each 0   predniSONE (DELTASONE) 20 MG tablet 2 tabs po daily x 4 days 8 tablet 0   predniSONE (DELTASONE) 5 MG tablet Take 1 tablet (5 mg total) by mouth daily with breakfast. 30 tablet 2   Respiratory Therapy Supplies (NEBULIZER/TUBING/MOUTHPIECE) KIT 1 each by Does not apply route as directed. 2 kit 5   Sodium Phosphates (ENEMA RE) Place 1 enema rectally daily as needed (constipation).     No facility-administered medications prior to visit.       Objective:   Physical Exam:  General appearance: 71 y.o., male, NAD, conversant  Eyes: anicteric sclerae; PERRL, tracking appropriately HENT: NCAT; MMM Neck: Trachea midline; no lymphadenopathy, no JVD Lungs: CTAB, no crackles, no wheeze, with normal respiratory effort CV: RRR, no murmur  Abdomen: Soft, non-tender; non-distended, BS present  Extremities: No peripheral edema, warm Skin: Normal turgor and texture; no rash Psych: Appropriate affect Neuro: Alert and oriented to person and place, no focal deficit     There were no vitals filed for this visit.   on *** LPM *** RA BMI Readings from Last 3 Encounters:  12/02/21 31.33 kg/m  06/29/21 30.38 kg/m  04/11/21 30.23 kg/m   Wt Readings from Last 3 Encounters:  12/02/21 231 lb (104.8 kg)  06/29/21 224 lb (101.6 kg)  04/11/21 216 lb 11.4 oz (98.3 kg)     CBC    Component Value Date/Time   WBC 9.7 12/02/2021 2114   RBC 4.53 12/02/2021 2114   HGB 14.4 12/02/2021 2114   HGB 15.1 11/09/2021 1711   HCT 43.2 12/02/2021 2114   HCT 45.5 11/09/2021 1711   PLT 277 12/02/2021 2114   PLT 271 11/09/2021 1711   MCV 95.4 12/02/2021 2114   MCV 93 11/09/2021 1711   MCH 31.8 12/02/2021 2114   MCHC 33.3 12/02/2021 2114   RDW 13.2 12/02/2021 2114   RDW 12.9 11/09/2021 1711   LYMPHSABS 1.5 11/09/2021 1711   MONOABS 0.3 04/11/2021  0217   EOSABS 0.4 11/09/2021 1711   BASOSABS 0.1 11/09/2021 1711    ***  Chest Imaging: CXR 12/02/21 with atelectasis in bases   CT A/P lung bases 06/2021 reviewed by me with a little bit of scarring but otherwise unremarkable   Pulmonary Functions Testing Results: No flowsheet data found.  Last spirometry reviewed by me remarkable for: FEV1 40% 10/2021 (ratio 62%)  PSG 2017 with AHI 8.5, desaturation to 79%  Echocardiogram:   TTE 2017 normal      Assessment & Plan:   # OCS dependent ACOS # AR # Chronic hypoxemic respiratory failure  Plan:      Maryjane Hurter, MD [facility] 12/04/2021 7:05 PM

## 2021-12-06 ENCOUNTER — Institutional Professional Consult (permissible substitution): Payer: Medicare (Managed Care) | Admitting: Student

## 2021-12-11 ENCOUNTER — Telehealth: Payer: Self-pay | Admitting: *Deleted

## 2021-12-11 NOTE — Telephone Encounter (Signed)
Called patient to advise delivery for Tezspire for tomorrow and he wants to know if he needs to pre-medicate prior to initial dose due to his past history for reactions. Patient advised taking loratadine daily and prednisone off and on.

## 2021-12-12 MED ORDER — CETIRIZINE HCL 10 MG PO TABS: 10.0000 mg | ORAL_TABLET | Freq: Two times a day (BID) | ORAL | 2 refills | Status: AC

## 2021-12-12 NOTE — Telephone Encounter (Signed)
He has been told this multiple times in the past:   He should double his prednisone to BID and add cetirizine 10mg  BID for four days before AND four days after the injection to prevent any reactions.  We can just give him extra cetirizine today if he has not done this yet. But he should continue with the post injection dosing of prednisone and cetirizine.   [name], MD [facility] [place]

## 2021-12-12 NOTE — Telephone Encounter (Signed)
Called patient advised instrux and sent in cetririzine rx. Appt made to start 3/6

## 2021-12-12 NOTE — Addendum Note (Signed)
Addended by: Carin Hock on: 12/12/2021 02:32 PM   Modules accepted: Orders

## 2021-12-18 ENCOUNTER — Ambulatory Visit: Payer: Medicare (Managed Care)

## 2021-12-18 ENCOUNTER — Telehealth: Payer: Self-pay

## 2021-12-18 MED ORDER — LORATADINE 10 MG PO TABS: 10.0000 mg | ORAL_TABLET | Freq: Every day | ORAL | 5 refills | Status: AC | PRN

## 2021-12-18 NOTE — Telephone Encounter (Signed)
Patient called in - DOB verified - advising the following: ? ?He fell off the roof this past weekend and needed to cancel his Tezspire injection appt for today. ? ?Reporting reaction to Cetirizine (Zyrtec) - cramps, gas, explosive diarrhea. Requesting to switch to Loratadine (Claritin) to take with Prednisone. ? ?Requesting refill on Arformoterol Garlon Hatchet). Patient did inform me that the [facility] was out of this medication. I did call [facility] - they only 1 and it was expired. He doesn't know if they is an alternative you can prescribe for him. ? ? ? ?Patient advised message would be forwarded to provider. Patient verbalized understand, no further questions. ?

## 2021-12-18 NOTE — Telephone Encounter (Signed)
Fyi.Marland KitchenMarland KitchenPatient is being treated for fall - he was waiting on call to schedule MRI when he called [facility]. ? ?Called patient - LMOVM of provider notation.  ? ?Electronically sending in Loratadine 10 mg once a day as needed ?

## 2021-12-18 NOTE — Telephone Encounter (Signed)
Did he go to the ER after falling off the roof? If not he needs to go and be evaluated. ? ?Ok to stop cetirizine (Zyrtec) and start loratadine 10 mg once a day as needed ? ?Please have him call around to other pharmacies to see who has aformoterol Garlon Hatchet) and we can send in the prescription.

## 2021-12-20 NOTE — Telephone Encounter (Signed)
Goodness - I hope he recovers. The last thing that he needs is another medical problem. ? ?Jonathon Marvel, MD ?[facility] ? ?

## 2021-12-21 ENCOUNTER — Telehealth: Payer: Self-pay | Admitting: Allergy & Immunology

## 2021-12-21 NOTE — Telephone Encounter (Signed)
Patient called and needs to talk with a nurse or dr gallagher about this rx methylprednisolone . His ortho doctor called it in and the pharmacy told patient he needs to talk with his doctor about this.with his other meds he is on. [phone]. ?

## 2021-12-21 NOTE — Telephone Encounter (Signed)
Called and left a voicemail asking for patient to return call to discuss.  °

## 2021-12-22 NOTE — Telephone Encounter (Signed)
Patient called back and stated that he is currently on methylprednisolone and wanted to know if it was going to interfere with the daily prednisone he is currently take from Dr. Ernst Bowler. I spoke with Dr. Ernst Bowler and he verbalized that it would not interfere but if patient wanted to he could stop the prednisone while he is on the methylprednisolone taper and resume back on the prednisone one the taper is complete. Patient verbalized understanding and agreed to do so. Patient also scheduled his Tezspire new start.  ?

## 2021-12-29 ENCOUNTER — Ambulatory Visit: Payer: Medicare (Managed Care)

## 2022-03-01 ENCOUNTER — Other Ambulatory Visit: Payer: Medicare (Managed Care) | Admitting: *Deleted

## 2022-03-01 DIAGNOSIS — Z515 Encounter for palliative care: Secondary | ICD-10-CM

## 2022-03-07 ENCOUNTER — Telehealth: Payer: Self-pay

## 2022-03-07 NOTE — Telephone Encounter (Signed)
Will let Dr. Ernst Bowler advise as he is well known to him and I have not seen him.

## 2022-03-07 NOTE — Telephone Encounter (Signed)
Called and notified patient that we needed to schedule a follow up visit as we have recently gotten paperwork from [facility] and we needed to follow up due to legal purposes. Patient verbalized understanding and asked to be scheduled further out due to having to burry his mom in ten days and just burying his brother two months ago. Patient also wants to discuss changing to a different oxygen brand/company called [employer]. I expressed that he would have to notify his provider or speak with [employer]. Patient states that he currently has 15 full tanks of oxygen in his garage. I asked why so many and he expressed that he doesn't need or use them as much. He states that he has used them more since the recent death in the family as well as working out in the yard more. I stated that he needed to express this to [place] to possible delay shipments as well as speak with the provider to reevaluate assessment. Patient stated that he needed to do a telephone visit as he is dealing with a lot at this time. I have scheduled patient with FNP, Althea Charon. Please advise on the [place] change as well as assessment to be discussed at next visit. Thank you!

## 2022-03-08 NOTE — Telephone Encounter (Signed)
Called patient - LMOVM to contact office regarding his request to change oxygen companies.  Please advise patient when he calls back that per provider - patient will need to contact his pulmonology office to have his oxygen changed from one company to another.

## 2022-03-08 NOTE — Telephone Encounter (Signed)
I am sorry about all of the social issues that he has been dealing with.  We do not order oxygen.  This is all done through pulmonology.  I would not know the first thing about ordering oxygen.

## 2022-03-14 NOTE — Progress Notes (Signed)
[facility] RN NOTE  PATIENT NAME: [name] DOB: [birth year] MRN: [id number]  PRIMARY CARE PROVIDER: Gearlean Alf, PA-C  RESPONSIBLE PARTY: Sheria Lang (wife) Acct ID - Guarantor Home Phone Work Phone Relationship Acct Type  [id number] Jonathon [id number]  Self P/F     [address], [place], [place] [address]   Due to the COVID-19 crisis, this virtual check-in visit was done via telephone from my office and it was initiated and consent by this patient and or family.  RN telephonic encounter completed with patient.  Cognitive: Patient is concerned that he is having a difficult time remembering. He says he has driven to scheduled doctor's appointments but gets lost and never gets there or is late. Sometimes he thinks he went and did not. He says his family thinks he is losing reality. He states his mother in law died a few months ago and his own mother passed away a week ago.  Pain: Ongoing issues with chronic pain. Reports arthritis in back, hands, elbows and knees. Also experiencing L shoulder pain. He says he tore a ligament in his left bicep trying to pick something up. Received a Cortisone injection in L shoulder 2 months ago which helped. Reports not being able to take Hydrocodone or Percocet as it affects his breathing and feels it causes heart irregularities and fluttering. He has added CBD gummies twice daily to his pain regimen along with a CBD capsule mid-day and feels it helps.   Respiratory: Reports having issues with his asthma. Just completed nebulizer treatment at start of my call. Feels that he is using oxygen more overall. He states his Allergist wants him to try Tezspire but he is afraid of possible reactions. Reports  Fasenra caused hives and Dupixent made him feel that he was having a heart attack. He says he was told to double up on current Asthma medications but this makes him nauseated and dizzy at times. He is taking Zofran to  help. He does not want to be put on a cpap at night.  Constipation: He reports severe constipation recently. Requiring laxatives (Miralax and Linzess) and enemas regularly and still has some issues. Feels it may be due to his prostate cancer. He is scheduled for endoscopy and colonoscopy on March 16, 2022.   Skin: Has an appointment with Dermatology on March 21, 2022 to have a skin biopsy of an area near his right elbow. He says in the past he has had several basal skin cancers removed around his ear, nose and top of head.  Palliative care will continue to follow.   (Duration of visit and documentation 60 minutes)   Daryl Eastern, RN BSN

## 2022-05-14 ENCOUNTER — Ambulatory Visit: Payer: Medicare (Managed Care) | Admitting: Family

## 2022-05-17 ENCOUNTER — Encounter: Payer: Self-pay | Admitting: Allergy & Immunology

## 2022-05-17 ENCOUNTER — Ambulatory Visit (INDEPENDENT_AMBULATORY_CARE_PROVIDER_SITE_OTHER): Payer: Medicare (Managed Care) | Admitting: Allergy & Immunology

## 2022-05-17 VITALS — BP 120/60 | HR 68 | Temp 98.4°F | Resp 20 | Wt 227.4 lb

## 2022-05-17 DIAGNOSIS — J449 Chronic obstructive pulmonary disease, unspecified: Secondary | ICD-10-CM | POA: Diagnosis not present

## 2022-05-17 DIAGNOSIS — J3089 Other allergic rhinitis: Secondary | ICD-10-CM

## 2022-05-17 DIAGNOSIS — T63481D Toxic effect of venom of other arthropod, accidental (unintentional), subsequent encounter: Secondary | ICD-10-CM

## 2022-05-17 DIAGNOSIS — J302 Other seasonal allergic rhinitis: Secondary | ICD-10-CM

## 2022-05-17 DIAGNOSIS — T63441D Toxic effect of venom of bees, accidental (unintentional), subsequent encounter: Secondary | ICD-10-CM

## 2022-05-17 DIAGNOSIS — G4733 Obstructive sleep apnea (adult) (pediatric): Secondary | ICD-10-CM

## 2022-05-17 DIAGNOSIS — K219 Gastro-esophageal reflux disease without esophagitis: Secondary | ICD-10-CM | POA: Diagnosis not present

## 2022-05-17 NOTE — Patient Instructions (Addendum)
1. Severe persistent asthma with COPD overlap - Lung testing looked stable today. - I believe that [name] is already approve.d - For your Tezspire: take loratadine two tablets twice daily for two days before AND two days after the injection.  - Daily controller medication(s): Brovana (aformoterol) 58mg FOUR TIMES DAILY + Pulmicort (budesonide) 0.5 mg nebulizer treatment FOUR TIMES DAILY + montelukast 10 mg ONE TIME DAILY + prednisone '5mg'$  ONCE DAILY + Tezspire monthly - Prior to physical activity: ProAir 2 puffs 10-15 minutes before physical activity. - Rescue medications: albuterol nebulizer one vial Luna 4-6 hours as needed or DuoNeb nebulizer one vial Luna 4-6 hours as needed - Asthma control goals:  * Full participation in all desired activities (may need albuterol before activity) * Albuterol use two time or less a week on average (not counting use with activity) * Cough interfering with sleep two time or less a month * Oral steroids no more  than once a year * No hospitalizations  2. Perennial and seasonal allergic rhinitis - Continue with loratadine '10mg'$  daily as needed. - Continue with fluticasone 1-2 sprays per nostril daily as needed.   3. GERD - Continue with omeprazole '40mg'$  BUT INCREASE TO TWICE DAILY.   4. Stinging insect anaphylaxis (honeybee, hornets, yellow jacket, wasp) - Continue to avoid insect stings. - EpiPen is up to date.   5. Return in about 4 months (around 09/16/2022).    Please inform u[place]of any Emergency Department visits, hospitalizations, or changes in symptoms. Call u[place]abefore going to the ED for breathing or allergy symptoms since we might be able to fit you in for a sick visit. Feel free to contact u[place]oreaanytime with any questions, problems, or concerns.  It was a pleasure to talk to you today today! Tell the missus I said hello!   Websites that have reliable patient information: 1. American Academy of Asthma, Allergy, and Immunology: www.aaaai.org 2.  Food Allergy Research and Education (FARE): foodallergy.org 3. Mothers of Asthmatics: http://www.asthmacommunitynetwork.org 4. American College of Allergy, Asthma, and Immunology: www.acaai.org   COVID-19 Vaccine Information can be found at: [email or web address] questions related to vaccine distribution or appointments, please email [email or web address] or call [phone]     "Like" u[place]on Facebook and Instagram for our latest updates!       Make sure you are registered to vote! If you have moved or changed any of your contact information, you will need to get this updated before voting!  In some cases, you MAY be able to register to vote online: [email or web address]

## 2022-05-17 NOTE — Progress Notes (Signed)
FOLLOW UP  Date of Service/Encounter:  05/17/22   Assessment:   Severe persistent asthma with COPD overlap - starting Tezspire    Systemic reaction to Berna Bue (priro to my taking over his care)   Systemic reaction versus panic attack to Nucala    Chronic prednisone use (15+ years) - currently on 76m daily (with intermittent self-induced bursts up to 272mdaily)   Seasonal and perennial allergic rhinitis (grasses, weeds, trees, cat, dog, dust mite, and cockroach)   History of non-compliance   Pulmonary nodules (stable per last chest CT June 2019)   Complex medical history, including bipolar depression   GERD - on omeprazole   OSA - with history of non-compliance with CPAP    Stinging insect anaphylaxis - interested in starting venom immunotherapy, although his poor follow up, poor compliance, and poor lung function make this unlikely to happen for now  Plan/Recommendations:   1. Severe persistent asthma with COPD overlap - Lung testing looked stable today. - We are going to work on the [employer]- For your Tezspire: take loratadine two tablets twice daily for two days before AND two days after the injection.  - Daily controller medication(s): Brovana (aformoterol) 1571mFOUR TIMES DAILY + Pulmicort (budesonide) 0.5 mg nebulizer treatment FOUR TIMES DAILY + montelukast 10 mg ONE TIME DAILY + prednisone 5mg17mCE DAILY + Tezspire monthly - Prior to physical activity: ProAir 2 puffs 10-15 minutes before physical activity. - Rescue medications: albuterol nebulizer one vial every 4-6 hours as needed or DuoNeb nebulizer one vial every 4-6 hours as needed - Asthma control goals:  * Full participation in all desired activities (may need albuterol before activity) * Albuterol use two time or less a week on average (not counting use with activity) * Cough interfering with sleep two time or less a month * Oral steroids no more  than once a year * No hospitalizations  2.  Perennial and seasonal allergic rhinitis - Continue with loratadine 10mg73mly as needed. - Continue with fluticasone 1-2 sprays per nostril daily as needed.   3. GERD - Continue with omeprazole 40mg 35mINCREASE TO TWICE DAILY.   4. Stinging insect anaphylaxis (honeybee, hornets, yellow jacket, wasp) - Continue to avoid insect stings. - EpiPen is up to date.   5. Return in about 4 months (around 09/16/2022).    Subjective:   Jonathon [id number] y.o37male presenting today for follow up of No chief complaint on file.   [name] history of the following: Patient Active Problem List   Diagnosis Date Noted   Acute exacerbation of chronic obstructive pulmonary disease (COPD) (HCC) 0[place]8/2022   Pneumonia due to COVID-19 virus 07/27/2020   COPD with acute exacerbation (HCC) 0[place]0/2020   PSVT (paroxysmal supraventricular tachycardia) (HCC) 1[place]0/2019   Seasonal and perennial allergic rhinitis 07/30/2018   Asthma-COPD overlap syndrome (HCC) 0[place]3/2019   S/P arthroscopy of right shoulder 04/25/2017   Generalized anxiety disorder 04/22/2017   Severe persistent asthma without complication 02/13/23/82/5003 sting-induced anaphylaxis 02/12/2017   Anaphylactic reaction due to food, initial encounter 02/12/2017   Current use of beta blocker 02/12/2017   Allergic rhinitis 01/15/2017   COPD exacerbation (HCC) 0[place]1/2018   Asthma exacerbation 11/03/2016   Nausea without vomiting 09/17/2016   Shortness of breath    Diabetes mellitus type 2 in obese (HCC) 0[place]2/2017   non-specific Chest pain 06/12/2016   Patient's noncompliance with other medical treatment and regimen 06/02/2016  Hyperlipidemia, mixed 06/02/2016   Elevated liver enzymes  06/02/2016   Schizophrenia ([place]) 04/30/2016   Environmental and seasonal allergies 04/30/2016   Diabetes mellitus without complication- (diet controlled; w/o proteinuria) 04/30/2016   Heavy alcohol use- Many yrs  04/30/2016   Basal cell  carcinoma of back 04/30/2016   Obesity 04/30/2016   Hx of multiple concussions 04/30/2016   h/o Vitamin D deficiency 04/30/2016   Screen for sexually transmitted diseases 04/30/2016   Immunocompromised state ([place]) 04/30/2016   Cervical radiculopathy 01/15/2016   Alcohol withdrawal ([place]) 11/22/2015   Alcohol abuse with alcohol-induced mood disorder ([place]) 11/22/2015   h/o Suicidal thoughts 11/21/2015   Dry mouth 10/07/2015   Anxiety 09/19/2015   OAB (overactive bladder) 07/27/2015   Chronic idiopathic constipation 05/08/2015   Screening for colon cancer    Benign neoplasm of cecum    Benign neoplasm of ascending colon    Benign neoplasm of transverse colon    Benign neoplasm of sigmoid colon    Rectal polyp    Gastroesophageal reflux disease without esophagitis    Esophageal stricture    Joint stiffness of multiple sites 10/17/2014   Benign localized hyperplasia of prostate with urinary obstruction 09/29/2014   concerns for memory loss 09/13/2014   Tuberculosis    Diverticulum of bladder 06/17/2014   Acute urinary retention 05/31/2014   Constipation 04/29/2014   Neuropathy ([place]) 04/11/2014   Post-polio syndrome 01/13/2014   Other emphysema ([place]) 01/13/2014   Hypertension 01/13/2014   Former smoker 01/13/2014   h/o Prostate cancer 01/13/2014   History of tuberculosis 01/13/2014   Bipolar disorder ([place]) 01/13/2014   Explosive personality disorder ([place]) 01/13/2014   Erectile dysfunction 01/13/2014   Nephrolithiasis 01/13/2014   Chronic pain 01/13/2014   Obstructive sleep apnea 94/85/4627   Alcoholic gastritis 03/50/0938   Nocturnal hypoxia 08/10/2013    History obtained from: chart review and patient.  [name] is a 71 y.o. male presenting for a follow up visit.  He was last seen in January 2023.  At that time, his lung testing was lower in the 40% range.  We worked on getting his breathing better.  We did obtain labs to see if he would qualify for Xolair.  We have  previously had him on Nucala, but he had some vague reactions to it and did not want to continue with it.  Before he met me, he had experienced anaphylaxis to [facility].  We continued him on Brovana 3 times daily as well as Pulmicort twice daily.  He was also tolerating once daily and montelukast daily.  His prednisone is down to 5 mg daily.  For his rhinitis, he was continued on loratadine as well as Flonase.  GERD was controlled with omeprazole 40 mg daily.  He was using Tums quite a bit.  For his stinging insect allergy, EpiPen was updated.  I did not feel comfortable doing allergy shots for his venom hypersensitivity due to his breathing status.  Since last visit, he has largely done well. He had been scheduled to follow-up in April, but presents now in August.   He was approved for Tezspire in February 2023.  However, it seems that he never actually reached out to start it.  Per a phone conversation in February 2023, I recommended doubling his prednisone and adding cetirizine 10 mg twice a day for 4 days before and 4 days after his Tezspire reaction.  This was communicated with the patient, but again he never showed up to start his injection.  He called back in March and reported that he had developed cramps and diarrhea from the cetirizine.  He wanted to switch back to loratadine.  We told him that was fine.  He called back in May to request help with getting his oxygen approved, but we recommended that he talk to pulmonology.  Asthma/Respiratory Symptom History: His asthma has been under fair control. He is currently using Brovana 4 times daily as well as Pulmicort 4 times daily.  He is also on montelukast 10 mg daily and prednisone 5 mg daily.  He has not started his Tezspire. He wants to discuss the premedication regimen again today. He remains nervous about starting the injectable medication. He is overall doing fairly well and he has not needed to increase his prednisone at all since the last visit.    Allergic Rhinitis Symptom History: His allergic rhinitis is somewhat controlled with the loratadine as well as the fluticasone. He has not needed antibiotics for sinus infections or any other infections since the last visit.   GERD Symptom History: He is on the omeprazole daily but he is having breakthrough symptoms fairly often, around 1-2 times daily. He has not been on BID dosing but he is open to that.   He has not been stung by any stinging insects. He does not mention venom immunotherapy at all this time. He does have an up to date EpiPen available.   His wife Mariann Laster is doing well. She is currently mad at home because he apparently reminded EMS about his mother in [place] DNR order when they were called when she was found unconscious. His wife has not forgiven him for that evidently.   Otherwise, there have been no changes to his past medical history, surgical history, family history, or social history.    Review of Systems  Constitutional: Negative.  Negative for fever, malaise/fatigue and weight loss.  HENT:  Positive for congestion. Negative for ear discharge and ear pain.   Eyes:  Negative for pain, discharge and redness.  Respiratory:  Positive for cough and sputum production. Negative for shortness of breath and wheezing.   Cardiovascular: Negative.  Negative for chest pain and palpitations.  Gastrointestinal:  Positive for heartburn. Negative for abdominal pain, nausea and vomiting.  Skin: Negative.  Negative for itching and rash.  Neurological:  Negative for dizziness and headaches.  Endo/Heme/Allergies:  Positive for environmental allergies. Does not bruise/bleed easily.       Objective:   Blood pressure 120/60, pulse 68, temperature 98.4 F (36.9 C), temperature source Temporal, resp. rate 20, weight 227 lb 6.4 oz (103.1 kg), SpO2 98 %. Body mass index is 30.84 kg/m.    Physical Exam Vitals reviewed.  Constitutional:      Appearance: He is well-developed.      Comments: In a fairly good mood. Boisterous.   HENT:     Head: Normocephalic and atraumatic.     Right Ear: Tympanic membrane, ear canal and external ear normal.     Left Ear: Tympanic membrane, ear canal and external ear normal.     Nose: No nasal deformity, septal deviation, mucosal edema or rhinorrhea.     Right Turbinates: Enlarged, swollen and pale.     Left Turbinates: Enlarged, swollen and pale.     Right Sinus: No maxillary sinus tenderness or frontal sinus tenderness.     Left Sinus: No maxillary sinus tenderness or frontal sinus tenderness.     Mouth/Throat:     Mouth: Mucous membranes are not  pale and not dry.     Pharynx: Uvula midline.     Comments: Cobblestoning present. Eyes:     General: Lids are normal. No allergic shiner.       Right eye: No discharge.        Left eye: No discharge.     Conjunctiva/sclera: Conjunctivae normal.     Right eye: Right conjunctiva is not injected. No chemosis.    Left eye: Left conjunctiva is not injected. No chemosis.    Pupils: Pupils are equal, round, and reactive to light.  Cardiovascular:     Rate and Rhythm: Normal rate and regular rhythm.     Heart sounds: Normal heart sounds.  Pulmonary:     Effort: Pulmonary effort is normal. No tachypnea, accessory muscle usage or respiratory distress.     Breath sounds: Examination of the right-upper field reveals wheezing. Examination of the left-upper field reveals wheezing. Examination of the right-middle field reveals wheezing. Examination of the left-middle field reveals wheezing. Wheezing present. No rhonchi or rales.     Comments: Diffuse expiratory wheezes.  Chest:     Chest wall: No tenderness.  Lymphadenopathy:     Cervical: No cervical adenopathy.  Skin:    Coloration: Skin is not pale.     Findings: No abrasion, erythema, petechiae or rash. Rash is not papular, urticarial or vesicular.  Neurological:     Mental Status: He is alert.  Psychiatric:        Behavior: Behavior is  cooperative.      Diagnostic studies:    Spirometry: results abnormal (FEV1: 1.49/46%, FVC: 2.13/50%, FEV1/FVC: 70%).    Spirometry consistent with possible restrictive disease.   Allergy Studies: none       Jonathon Marvel, MD  [facility] and [place] [facility] [place]

## 2022-05-23 ENCOUNTER — Encounter (INDEPENDENT_AMBULATORY_CARE_PROVIDER_SITE_OTHER): Payer: Self-pay

## 2022-05-24 ENCOUNTER — Telehealth: Payer: Self-pay

## 2022-05-24 NOTE — Telephone Encounter (Signed)
I feel like we do this every time. Does he keep changing insurance plans??   The only other one is Perforomist (formoterol).  He has an "allergy" to it, so we have never used it.  It is our PA process we can work on?  [name], MD Allergy and [place] [facility] [place]

## 2022-05-24 NOTE — Telephone Encounter (Signed)
Patient called in  - DOB/Pharmacy verified - stated Arformoterol Garlon Hatchet) cost is too expensive for him. Patient is requesting an alternative/substitute to be sent in to [facility].  Patient advised message will be forwarded to provider for recommendation - he can check his myChart for updates.  Patient verbalized understanding, no further questions.

## 2022-05-25 NOTE — Telephone Encounter (Addendum)
Arformoterol (Brovana) 15 mcg/2 mL PA...  KEY: [id number]  created: 05/25/22  Per Covermymeds: Drug is covered by current benefit plan. No further PA activity needed.  Forwarding message to provider for next step.

## 2022-05-28 NOTE — Telephone Encounter (Signed)
Form used - [employer] Electronic PA Form [phone] NCPDP)

## 2022-05-28 NOTE — Telephone Encounter (Signed)
Great so it should work fine then? Can we make sure they are running it through Medicare Part B?   [name], MD [facility] and [place] [facility] [place]

## 2022-06-05 NOTE — Progress Notes (Signed)
He is scheduled for 8/31 @ 3 for Tezspire. Im assuming we are using a sample?

## 2022-06-12 ENCOUNTER — Other Ambulatory Visit: Payer: Self-pay | Admitting: Allergy & Immunology

## 2022-06-14 ENCOUNTER — Ambulatory Visit (INDEPENDENT_AMBULATORY_CARE_PROVIDER_SITE_OTHER): Payer: Medicare (Managed Care)

## 2022-06-14 DIAGNOSIS — J4550 Severe persistent asthma, uncomplicated: Secondary | ICD-10-CM

## 2022-06-14 MED ORDER — TEZEPELUMAB-EKKO 210 MG/1.91ML ~~LOC~~ SOSY: 210.0000 mg | PREFILLED_SYRINGE | Freq: Once | SUBCUTANEOUS | Status: DC

## 2022-06-14 MED ORDER — TEZEPELUMAB-EKKO 210 MG/1.91ML ~~LOC~~ SOSY
210.0000 mg | PREFILLED_SYRINGE | SUBCUTANEOUS | Status: DC
  Administered 2022-06-14: 210 mg via SUBCUTANEOUS

## 2022-06-14 NOTE — Progress Notes (Signed)
Immunotherapy   Patient Details  Name: [name] MRN: [id number] Date of Birth: [birth year]  06/14/2022  [name] started injections for  Tezspire Following schedule: Every twenty eight days.  Frequency: every four weeks.  Epi-Pen: Not needed Consent signed previously and patient instructions given. Patient and his wife waited in the larger lobby for thirty minutes without an issue. Patient insisted that he needed all the information about his medications as he has had multiple reactions and rather than call here to get advise on treatment he will seek treatment at the ED. Medication list as well as date of injection for today were given to patient in case he goes for treatment.   No reactions, questions, or concerns were addressed after his thirty minutes were completed.    [name] 06/14/2022, 4:10 PM

## 2022-06-15 ENCOUNTER — Telehealth: Payer: Self-pay

## 2022-06-15 NOTE — Telephone Encounter (Signed)
Patient called in - DOB verified - stated he waited about 3 months but wanted Dr. Ernst Bowler to know the following:  Received his injection on yesterday, Thursday, 06/14/22 - no reaction at all - did begin coughing - some phlegm - between 1 pm -  5 pm yesterday. Patient state he didn't know whether or not that's a reaction or not, just wanted the provider to know.   Patient advised message will be forwarded to provider - no further questions.

## 2022-06-19 NOTE — Telephone Encounter (Signed)
That is surprising! I do not think that this is related to the injection. I am glad that he tolerated it so well.   [name], MD Allergy and [place] [facility] [place]

## 2022-06-19 NOTE — Telephone Encounter (Signed)
Called and left a detailed voicemail advising.

## 2022-06-26 ENCOUNTER — Telehealth: Payer: Self-pay | Admitting: Allergy & Immunology

## 2022-06-26 MED ORDER — ARFORMOTEROL TARTRATE 15 MCG/2ML IN NEBU: 15.0000 ug | INHALATION_SOLUTION | Freq: Three times a day (TID) | RESPIRATORY_TRACT | 5 refills | Status: DC

## 2022-06-26 MED ORDER — BUDESONIDE 0.5 MG/2ML IN SUSP: 0.5000 mg | Freq: Two times a day (BID) | RESPIRATORY_TRACT | 5 refills | Status: DC

## 2022-06-26 MED ORDER — LEVALBUTEROL HCL 1.25 MG/3ML IN NEBU: 1.2500 mg | INHALATION_SOLUTION | RESPIRATORY_TRACT | 5 refills | Status: AC | PRN

## 2022-06-26 NOTE — Telephone Encounter (Signed)
Patient called requesting that we send the neb solutions to [place]. Confirmed pharmacy and sent it in.   [name], MD Allergy and [place] [facility] [place]

## 2022-07-02 NOTE — Telephone Encounter (Signed)
[facility] called stating that [name] was sent in for 3 times a day and [facility] stated it should be 2 times a day. Per Patients last after visit summary it stated to take 4 times a day. Please advise frequency of medication to be taken.

## 2022-07-03 MED ORDER — ARFORMOTEROL TARTRATE 15 MCG/2ML IN NEBU: 15.0000 ug | INHALATION_SOLUTION | Freq: Two times a day (BID) | RESPIRATORY_TRACT | 5 refills | Status: DC

## 2022-07-03 NOTE — Telephone Encounter (Signed)
I called [facility] about the Brovana needing to be changed to 2 times a day. They were not able to change the dose with verbal consent. I resent the prescription in as twice a day per pharmacy request.

## 2022-07-03 NOTE — Telephone Encounter (Signed)
Thank you, Diandra!   [name], MD Allergy and [place] [facility] [place]

## 2022-07-03 NOTE — Telephone Encounter (Signed)
Well that sounds like a cluster. Let's just do twice daily. I cannot imagine that it will be covered for more than that.   [name], MD Allergy and [place] [facility] [place]

## 2022-07-13 ENCOUNTER — Ambulatory Visit: Payer: Medicare (Managed Care)

## 2022-07-19 ENCOUNTER — Ambulatory Visit: Payer: Medicare (Managed Care)

## 2022-08-01 ENCOUNTER — Other Ambulatory Visit: Payer: Self-pay

## 2022-08-01 ENCOUNTER — Telehealth: Payer: Self-pay | Admitting: *Deleted

## 2022-08-01 MED ORDER — IPRATROPIUM-ALBUTEROL 0.5-2.5 (3) MG/3ML IN SOLN: 3.0000 mL | Freq: Every evening | RESPIRATORY_TRACT | 1 refills | Status: DC

## 2022-08-01 NOTE — Telephone Encounter (Signed)
Patient called and stated that for future refills of his asthma medications it will need to be sent to [facility].

## 2022-08-09 ENCOUNTER — Ambulatory Visit
Admission: EM | Admit: 2022-08-09 | Discharge: 2022-08-09 | Disposition: A | Discharge status: Other Acute Inpatient Hospital | Payer: Medicare (Managed Care) | Attending: Internal Medicine | Admitting: Internal Medicine

## 2022-08-09 ENCOUNTER — Other Ambulatory Visit: Payer: Self-pay

## 2022-08-09 ENCOUNTER — Encounter: Payer: Self-pay | Admitting: Emergency Medicine

## 2022-08-09 DIAGNOSIS — R0789 Other chest pain: Secondary | ICD-10-CM | POA: Diagnosis not present

## 2022-08-09 NOTE — ED Provider Notes (Signed)
[name]    CSN: [id number] Arrival date & time: 08/09/22  1617      History   Chief Complaint Chief Complaint  Patient presents with   Chest Pain    HPI [name] is a 71 y.o. male.   Patient presents with mid chest pain that started this morning around 5:30 AM.  Patient reports it feels like a "pressure with intermittent stabbing pains".  Patient reports that the stabbing pain is rated 8/10 on pain scale.  Patient denies any associated shortness of breath, headache, dizziness, blurred vision, nausea, vomiting.  Patient does report that he has nitroglycerin available when he has associated chest pain.  He took some this morning for 1 dose which minimally and temporarily improved chest pain, but then chest pain returned.  He states that he takes nitroglycerin as needed for chest pain associated with "abnormal heart rate".  He states that he has been in A-fib twice.   Chest Pain   Past Medical History:  Diagnosis Date   Anxiety disorder    Arthritis    BILATERAL SHOULDERS, ELBOWS AND HANDS AND LEFT HIP AND KNEES--HX CORTISONE SHOTS IN SHOULDERS, ELBOWS, HIP AND KNEES   Asthma    Basal cell carcinoma of back 04/30/2016   Dermatologist- Dr Nevada Crane;   MOHS sx- Dr Levada Dy    Bladder outlet obstruction    Chronic idiopathic constipation 62/94/7654   Complication of anesthesia    DIFFICULT WAKING    COPD (chronic obstructive pulmonary disease) [place] Frequent exacerbations 01/13/2014   [name] 07/08/14: FeV1 51% FeV1/FVC 66% FVC 59% 10/5/2015ONO RA was normal  10/13/2014  ONO on RA NORMAL    Depression    Diabetes mellitus without complication ([place])    BODERLINE - DIET CONTROL   Former smoker 01/13/2014   Gastroesophageal reflux disease without esophagitis    Heavy alcohol use 04/30/2016   History of oxygen administration    oxygen use 2 l/m nasally at bedtime and exertional occasions   History of rheumatic fever    Hx of multiple concussions    x 2 per  patient    Hypertension    Melanoma ([place])    Nocturnal oxygen desaturation    USES O2 NIGHTLY   OSA (obstructive sleep apnea) 01/13/2014   PONV (postoperative nausea and vomiting)    Post-polio syndrome    polio at age 5--PT WAS IN IRON LUNG; PT WAS IN W/C UNTIL AGE 47; STILL HAS WEAKNESS RIGHT SIDE   Prostate cancer ([place])    Schizophrenia ([place])    Tuberculosis 1984   Hosp 4 months rx , left early    Urticaria     Patient Active Problem List   Diagnosis Date Noted   Acute exacerbation of chronic obstructive pulmonary disease (COPD) ([place]) 04/11/2021   Pneumonia due to COVID-19 virus 07/27/2020   COPD with acute exacerbation ([place]) 12/04/2018   PSVT (paroxysmal supraventricular tachycardia) 09/23/2018   Seasonal and perennial allergic rhinitis 07/30/2018   Asthma-COPD overlap syndrome 07/07/2018   S/P arthroscopy of right shoulder 04/25/2017   Generalized anxiety disorder 04/22/2017   Severe persistent asthma without complication 65/12/5463   Bee sting-induced anaphylaxis 02/12/2017   Anaphylactic reaction due to food, initial encounter 02/12/2017   Current use of beta blocker 02/12/2017   Allergic rhinitis 01/15/2017   COPD exacerbation ([place]) 11/04/2016   Asthma exacerbation 11/03/2016   Nausea without vomiting 09/17/2016   Shortness of breath    Diabetes mellitus type 2 in obese ([place]) 06/26/2016  non-specific Chest pain 06/12/2016   Patient's noncompliance with other medical treatment and regimen 06/02/2016   Hyperlipidemia, mixed 06/02/2016   Elevated liver enzymes  06/02/2016   Schizophrenia ([place]) 04/30/2016   Environmental and seasonal allergies 04/30/2016   Diabetes mellitus without complication- (diet controlled; w/o proteinuria) 04/30/2016   Heavy alcohol use- Many yrs  04/30/2016   Basal cell carcinoma of back 04/30/2016   Obesity 04/30/2016   Hx of multiple concussions 04/30/2016   h/o Vitamin D deficiency 04/30/2016   Screen for sexually transmitted diseases  04/30/2016   Immunocompromised state ([place]) 04/30/2016   Cervical radiculopathy 01/15/2016   Alcohol withdrawal ([place]) 11/22/2015   Alcohol abuse with alcohol-induced mood disorder ([place]) 11/22/2015   h/o Suicidal thoughts 11/21/2015   Dry mouth 10/07/2015   Anxiety 09/19/2015   OAB (overactive bladder) 07/27/2015   Chronic idiopathic constipation 05/08/2015   Screening for colon cancer    Benign neoplasm of cecum    Benign neoplasm of ascending colon    Benign neoplasm of transverse colon    Benign neoplasm of sigmoid colon    Rectal polyp    Gastroesophageal reflux disease without esophagitis    Esophageal stricture    Joint stiffness of multiple sites 10/17/2014   Benign localized hyperplasia of prostate with urinary obstruction 09/29/2014   concerns for memory loss 09/13/2014   Tuberculosis    Diverticulum of bladder 06/17/2014   Acute urinary retention 05/31/2014   Constipation 04/29/2014   Neuropathy ([place]) 04/11/2014   Post-polio syndrome 01/13/2014   Other emphysema ([place]) 01/13/2014   Hypertension 01/13/2014   Former smoker 01/13/2014   h/o Prostate cancer 01/13/2014   History of tuberculosis 01/13/2014   Bipolar disorder ([place]) 01/13/2014   Explosive personality disorder ([place]) 01/13/2014   Erectile dysfunction 01/13/2014   Nephrolithiasis 01/13/2014   Chronic pain 01/13/2014   Obstructive sleep apnea 16/83/7290   Alcoholic gastritis 21/08/5519   Nocturnal hypoxia 08/10/2013    Past Surgical History:  Procedure Laterality Date   ADENOIDECTOMY     CARDIOVASCULAR STRESS TEST  06-08-2014  dr Mare Ferrari   normal lexiscan study/  no ischemia/  not gated due to PAC's   COLONOSCOPY N/A 05/03/2015   Procedure: COLONOSCOPY;  Surgeon: [name], MD;  Location: [facility];  Service: Endoscopy;  Laterality: N/A;   CYSTOSCOPY N/A 10/25/2015   Procedure: CYSTOSCOPY;  Surgeon: [name], MD;  Location: [facility];  Service: Urology;  Laterality: N/A;   CYSTOSCOPY W/ CYSTOGRAM/   TRANSRECTAL ULTRASOUND PROSTATE BX  03-22-2009   ESOPHAGOGASTRODUODENOSCOPY N/A 03/22/2015   Procedure: ESOPHAGOGASTRODUODENOSCOPY (EGD) with dilation;  Surgeon: [name], MD;  Location: [facility];  Service: Endoscopy;  Laterality: N/A;   excision of skin lesion     LAPAROSCOPIC CHOLECYSTECTOMY  2013   left elbow surgery      due to fracture    NASAL SEPTUM SURGERY  2000   OTHER SURGICAL HISTORY      Muscle & bone Graft/Polio   polio surgeries      14 polio surgeries    PROSTATE BIOPSY N/A 09/28/2014   Procedure: PROSTATE ULTRASOUND/BIOPSY;  Surgeon: [name], MD;  Location: [facility];  Service: Urology;  Laterality: N/A;   PROSTATE BIOPSY N/A 10/25/2015   Procedure: PROSTATE BIOPSY AND ULTRASOUND;  Surgeon: [name], MD;  Location: [facility];  Service: Urology;  Laterality: N/A;   SAVORY DILATION N/A 03/22/2015   Procedure: SAVORY DILATION;  Surgeon: [name], MD;  Location: [facility];  Service: Endoscopy;  Laterality: N/A;   SHOULDER ARTHROSCOPY WITH OPEN ROTATOR CUFF REPAIR Bilateral 2013  &  1999   removal spurs and bursectomy   TONSILLECTOMY     TRANSURETHRAL INCISION OF BLADDER NECK N/A 10/25/2015   Procedure:  TRANSURETHRAL INCISION OF BLADDER NECK;  Surgeon: [name], MD;  [facility];  Service: Urology;  Laterality: N/A;   TRANSURETHRAL RESECTION OF PROSTATE N/A 09/28/2014   Procedure: TRANSURETHRAL RESECTION OF THE PROSTATE (TURP);  Surgeon: [name], MD;  [facility];  Service: Urology;  Laterality: N/A;   URETEROSOPY STONE EXTRACTION  2000       Home Medications    Prior to Admission medications   Medication Sig Start Date End Date Taking? Authorizing Provider  arformoterol (BROVANA) 15 MCG/2ML NEBU Take 2 mLs (15 mcg total) by nebulization 2 (two) times daily. 07/03/22   Valentina Shaggy, MD  atorvastatin (LIPITOR) 20 MG tablet Take 20 mg by mouth daily. 03/30/21   [provider]  BROVANA 15 MCG/2ML NEBU Take 2 mLs (15 mcg total)  by nebulization in the morning, at noon, and at bedtime. 11/12/21   Valentina Shaggy, MD  budesonide (PULMICORT) 0.5 MG/2ML nebulizer solution Take 2 mLs (0.5 mg total) by nebulization 2 (two) times daily. 06/26/22   Valentina Shaggy, MD  cetirizine (ZYRTEC) 10 MG tablet Take 1 tablet (10 mg total) by mouth 2 (two) times daily. 12/12/21   Valentina Shaggy, MD  diphenhydrAMINE (SOMINEX) 25 MG tablet Take 25 mg by mouth at bedtime as needed for itching.    [provider]  docusate sodium (COLACE) 100 MG capsule Take 1 capsule (100 mg total) by mouth every 12 (twelve) hours. 04/11/21   Sherwood Gambler, MD  EPINEPHrine (EPIPEN 2-PAK) 0.3 mg/0.3 mL IJ SOAJ injection Inject 0.3 mLs (0.3 mg total) into the muscle as needed for anaphylaxis. 07/03/19   Valentina Shaggy, MD  fluticasone [facility]) 50 MCG/ACT nasal spray 1-2 sprays per nostril daily as needed Patient taking differently: Place 1 spray into both nostrils daily as needed for allergies. 03/08/21   Valentina Shaggy, MD  ipratropium-albuterol (DUONEB) 0.5-2.5 (3) MG/3ML SOLN Take 3 mLs by nebulization every evening. 08/01/22   Valentina Shaggy, MD  lactulose (CHRONULAC) 10 GM/15ML solution Take 30 mLs by mouth 3 (three) times daily as needed for mild constipation. 01/16/21   [provider]  levalbuterol Penne Lash) 1.25 MG/3ML nebulizer solution Take 1.25 mg by nebulization every 4 (four) hours as needed for wheezing. 06/26/22   Valentina Shaggy, MD  Lidocaine 5 % CREA Apply to perianal area as needed for pain. Patient taking differently: Place 1 application  around the anus daily as needed (pain). 10/09/20   Molpus, John, MD  loratadine (CLARITIN) 10 MG tablet Take 1 tablet (10 mg total) by mouth daily as needed for allergies or itching. 12/18/21   Althea Charon, FNP  nitroGLYCERIN (NITROSTAT) 0.4 MG SL tablet Place 1 tablet (0.4 mg total) under the tongue every 5 (five) minutes as needed for chest pain.  06/21/15   Brunetta Jeans, PA-C  ondansetron (ZOFRAN ODT) 4 MG disintegrating tablet Take 1 tablet (4 mg total) by mouth every 8 (eight) hours as needed for nausea or vomiting. 07/06/20   Valentina Shaggy, MD  OXYGEN Inhale 3-4 L into the lungs at bedtime. DURING THE DAY IF NEEDED Reported on 11/25/2015    [provider]  polyethylene glycol (MIRALAX / GLYCOLAX) 17 g packet Take 17 g by  mouth daily. 04/11/21   Sherwood Gambler, MD  predniSONE (DELTASONE) 20 MG tablet 2 tabs po daily x 4 days Patient not taking: Reported on 05/17/2022 04/12/21   Sherwood Gambler, MD  predniSONE (DELTASONE) 5 MG tablet Take 1 tablet (5 mg total) by mouth daily with breakfast. 11/12/21   Valentina Shaggy, MD  Respiratory Therapy Supplies (NEBULIZER/TUBING/MOUTHPIECE) KIT 1 each by Does not apply route as directed. 07/24/19   Valentina Shaggy, MD  Sodium Phosphates (ENEMA RE) Place 1 enema rectally daily as needed (constipation).    [provider]    Family History Family History  Problem Relation Age of Onset   Alzheimer's disease Father 4       Deceased   Stomach cancer Father    Heart attack Father    Heart disease Father    Skin cancer Mother        Facial-Living   Alcohol abuse Sister        x2   Mental illness Sister        x2   Alcohol abuse Sister    Diabetes Maternal Aunt        x2   Thyroid disease Maternal Aunt        x4   Diabetes Maternal Uncle    Tuberculosis Paternal Grandfather    Tuberculosis Paternal Grandmother    Alzheimer's disease Paternal Aunt    Alzheimer's disease Paternal Uncle    Colon cancer Neg Hx    Colon polyps Neg Hx    Crohn's disease Neg Hx    Ulcerative colitis Neg Hx    Allergic rhinitis Neg Hx    Angioedema Neg Hx    Asthma Neg Hx    Eczema Neg Hx    Urticaria Neg Hx     Social History Social History   Tobacco Use   Smoking status: Former    Types: Pipe    Quit date: 08/25/2013    Years since quitting: 8.9   Smokeless  tobacco: Former    Types: Chew   Tobacco comments:    5-6 times smoking a pipe  Vaping Use   Vaping Use: Never used  Substance Use Topics   Alcohol use: Yes   Drug use: No     Allergies   Aspirin, Hornet venom, Iodinated contrast media, Levaquin [levofloxacin in d5w], Nsaids, Penicillins, Tolmetin, Fasenra [benralizumab], Perforomist [formoterol], Buprenorphine hcl, Morphine and related, and Oxycodone   Review of Systems Review of Systems Per HPI  Physical Exam Triage Vital Signs ED Triage Vitals  Enc Vitals Group     BP 08/09/22 1639 (!) 168/90     Pulse Rate 08/09/22 1639 95     Resp 08/09/22 1639 18     Temp 08/09/22 1639 98.7 F (37.1 C)     Temp Source 08/09/22 1639 Oral     SpO2 08/09/22 1639 95 %     Weight --      Height --      Head Circumference --      Peak Flow --      Pain Score 08/09/22 1640 3     Pain Loc --      Pain Edu? --      Excl. in [place]? --    No data found.  Updated Vital Signs BP (!) 168/90 (BP Location: Left Arm)   Pulse 95   Temp 98.7 F (37.1 C) (Oral)   Resp 18   SpO2 95%   Visual Acuity Right Eye  Distance:   Left Eye Distance:   Bilateral Distance:    Right Eye Near:   Left Eye Near:    Bilateral Near:     Physical Exam Constitutional:      General: He is not in acute distress.    Appearance: Normal appearance. He is not toxic-appearing or diaphoretic.  HENT:     Head: Normocephalic and atraumatic.  Eyes:     Extraocular Movements: Extraocular movements intact.     Conjunctiva/sclera: Conjunctivae normal.     Pupils: Pupils are equal, round, and reactive to light.  Cardiovascular:     Rate and Rhythm: Normal rate and regular rhythm.     Pulses: Normal pulses.     Heart sounds: Normal heart sounds.  Pulmonary:     Effort: Pulmonary effort is normal. No respiratory distress.     Breath sounds: Normal breath sounds.  Neurological:     General: No focal deficit present.     Mental Status: He is alert and oriented to  person, place, and time. Mental status is at baseline.     Cranial Nerves: Cranial nerves 2-12 are intact.     Sensory: Sensation is intact.     Motor: Motor function is intact.     Coordination: Coordination is intact.     Gait: Gait is intact.  Psychiatric:        Mood and Affect: Mood normal.        Behavior: Behavior normal.        Thought Content: Thought content normal.        Judgment: Judgment normal.      UC Treatments / Results  Labs (all labs ordered are listed, but only abnormal results are displayed) Labs Reviewed - No data to display  EKG   Radiology No results found.  Procedures Procedures (including critical care time)  Medications Ordered in UC Medications - No data to display  Initial Impression / Assessment and Plan / UC Course  I have reviewed the triage vital signs and the nursing notes.  Pertinent labs & imaging results that were available during my care of the patient were reviewed by me and considered in my medical decision making (see chart for details).     Patient's EKG showing normal sinus rhythm with right bundle branch block which is changed from previous EKG.  He also reported that nitroglycerin helped improved chest pain which is concerning for cardiac etiology.  Blood pressure is also slightly elevated.  Due to all these factors, I do think that patient needs to be evaluated at the emergency department.  Patient was agreeable with plan.  Suggested EMS transport but patient declined.  Risks associated with not going by EMS were discussed with patient.  Patient voiced understanding and wished to self transport. Final Clinical Impressions(s) / UC Diagnoses   Final diagnoses:  Other chest pain     Discharge Instructions      Go to the emergency department as soon as you leave urgent care for further evaluation and management.    ED Prescriptions   None    PDMP not reviewed this encounter.   Teodora Medici, [place] 08/09/22 [phone]

## 2022-08-09 NOTE — Discharge Instructions (Signed)
Go to the emergency department as soon as you leave urgent care for further evaluation and management. 

## 2022-08-09 NOTE — ED Notes (Signed)
Patient is being discharged from the Urgent Care and sent to the Emergency Department via POV . Per HM, patient is in need of higher level of care due to CP. Patient is aware and verbalizes understanding of plan of care.  Vitals:   08/09/22 1639  BP: (!) 168/90  Pulse: 95  Resp: 18  Temp: 98.7 F (37.1 C)  SpO2: 95%

## 2022-08-09 NOTE — ED Triage Notes (Signed)
Pt here for sub sternal CP starting this am; pt sts taking nitro with some relief; pt sts pain worse with certain positions

## 2022-09-18 ENCOUNTER — Ambulatory Visit: Payer: Medicare (Managed Care) | Admitting: Allergy & Immunology

## 2022-09-20 ENCOUNTER — Telehealth: Payer: Self-pay | Admitting: Allergy & Immunology

## 2022-09-20 NOTE — Telephone Encounter (Signed)
Patient's doctor recommended him to get the RSV vaccine this year. Patient states he got the pneumonia vaccine not too long ago and had a reaction to it. Patient states with all other injections he has had to build up to them in case of a reaction. Patient is wanting to know if he needs to build up to the RSV vaccine or what does he need to do in order to receive the vaccine.   Please advise  Best contact number: [phone]

## 2022-09-21 ENCOUNTER — Telehealth: Payer: Self-pay | Admitting: Allergy & Immunology

## 2022-09-21 NOTE — Telephone Encounter (Signed)
He needs to keep an appointment. I am about ready to dismiss him.   [name], MD Allergy and [place] of [place]

## 2022-09-21 NOTE — Telephone Encounter (Signed)
[name] from [facility] called. She is requesting a call back in regards to patient.   Best contact number: [phone] ext [phone]

## 2022-09-21 NOTE — Telephone Encounter (Signed)
We can likely get it from [facility] and we could administer in the office if he is interested.   [name], MD Allergy and [place] of [place]

## 2022-09-21 NOTE — Telephone Encounter (Signed)
Would you want him to pre medicate with an antihistamine?

## 2022-09-21 NOTE — Telephone Encounter (Signed)
Called and spoke with [name] with [facility] and she stated that she needs an updated prescription and order for the Oxygen but the office notes need to be attached with it and they have to be within 90 days. I did advise that the patient has an office visit scheduled for December 28th to be seen. She stated that the office notes at that visit does need to mention the oxygen. The forms sent over for the Oxygen have been placed in the pending tray in the [place] office nurses station.

## 2022-09-25 NOTE — Telephone Encounter (Signed)
Does he increase the prednisone also before the injection? Are we able to get the RSV vaccine and give it in clinic to him and then monitor him? He said he has more delayed reactions then when he first gets it

## 2022-09-25 NOTE — Telephone Encounter (Signed)
Yes definitely do that - cetirizine '20mg'$  30 minuntes before the injection.

## 2022-09-27 NOTE — Telephone Encounter (Signed)
I think he gets himself worked up which leads to these perceived reactions.   I think should be able to get the RSV vaccine from [facility] (can we call and confirm that) and we could give it to him in a graded fashion at an 8:30 visit and then watch him through lunch.   I would prefer for him to not increase his baseline prednisone. This will dampen the immunologic effect of the vaccination.   [name], MD Allergy and [place] of [place]

## 2022-10-01 NOTE — Telephone Encounter (Signed)
Called the [facility] and asked if "[name]" can pick up the vaccine and have it administered here in the office and the answer is no. He would need to get it at the pharmacy. The [facility] doesn't carry the vaccine.    [name] [phone]

## 2022-10-02 NOTE — Telephone Encounter (Signed)
Can we have [facility] use a [facility] courier to deliver to [name]?   [name], MD Allergy and [place] [facility] [place]

## 2022-10-03 NOTE — Telephone Encounter (Signed)
Could you reach out to who ever you talked to at [facility] and see about this thank you

## 2022-10-04 NOTE — Telephone Encounter (Signed)
Please advise to o2 for pt thank you

## 2022-10-04 NOTE — Telephone Encounter (Signed)
Patient called stating he has received letter from [facility] that they will stop supplying his oxygen. Patient would like to know what can be done for this to be avoided?   Patient states he also spoke to his insurance - [employer], about getting coverage for oxygen that is easier to walk around in instead of having to carry a cylinder everywhere. Patient was told by insurance that Dr. Ernst Bowler would have to approve that.   Please advise  Best contact number: [phone]

## 2022-10-04 NOTE — Telephone Encounter (Signed)
I have never signed his O2 order. He also has a habit of not keeping appointments. So I have not talked to him in a while.  I think he needs to contact his Pulmonologist.   [name], MD Allergy and [place] of [facility]

## 2022-10-05 NOTE — Telephone Encounter (Signed)
Pt said he never has seen pulmonologist he had an appointment earlier this year but he no showed on it. He needs sn md not a pa so insurance pays for visit. Please advise to o2 for pt. I mentioned if his pcp might could have done the order Pt did state his pcp is very upset with him currently and they was probably the ones who did the original orders, but he is not 100% sure.

## 2022-10-10 NOTE — Telephone Encounter (Signed)
That is a good idea as well.  Jonathon Marvel, MD Allergy and [place] of [place]

## 2022-10-10 NOTE — Telephone Encounter (Signed)
Spoke with [place], she is going to speak with Jeannene Patella and Cristie Hem and see if they will release one to [name] for this instance. She states that if not the other option is for [name] to get the full dose at [facility] then come to our office and sit with [name] for an extended period of time and be observed.

## 2022-10-10 NOTE — Telephone Encounter (Signed)
I called and spoke with the pharmacist at [facility] and she stated that she could not dispense just one RSV Vaccine that it would have to be given at the pharmacy is the only way she can dispense it. She did say we could e-mail physician services to see if we could order a single dose of the RSV vaccine or if it needed to be a whole box of vaccine. Is this something that you are familiar with, Beth?

## 2022-10-10 NOTE — Telephone Encounter (Signed)
This is not surprising. He has fired a Engineer, materials as well.   Does he have enough O2 to last until next week when he sees me?   [name], MD Allergy and [place] of [place]

## 2022-10-11 ENCOUNTER — Ambulatory Visit: Payer: Medicare (Managed Care) | Admitting: Allergy & Immunology

## 2022-10-11 NOTE — Telephone Encounter (Signed)
[name] was able to get a single dose RSV ordered for the patient. He will need to be scheduled early in the morning to receive the vaccine and will need to stay for several hours. Patient did have an appointment today but no showed the appointment/ showed up 45 minutes late and scheduled for a follow up next week on January 4th. Will discuss with patient at that time.

## 2022-10-11 NOTE — Telephone Encounter (Signed)
Patient had an appointment scheduled for today and no showed/ was 45 minutes late for appointment. He has another appointment with Dr. Ernst Bowler for next week on January 4th.

## 2022-10-12 NOTE — Telephone Encounter (Signed)
If he would show up for appointments, we could discuss a plan with him.

## 2022-10-16 ENCOUNTER — Ambulatory Visit: Payer: Medicare (Managed Care) | Admitting: Internal Medicine

## 2022-10-18 ENCOUNTER — Other Ambulatory Visit: Payer: Self-pay

## 2022-10-18 ENCOUNTER — Encounter: Payer: Self-pay | Admitting: Allergy & Immunology

## 2022-10-18 ENCOUNTER — Ambulatory Visit (INDEPENDENT_AMBULATORY_CARE_PROVIDER_SITE_OTHER): Payer: Medicare (Managed Care) | Admitting: Allergy & Immunology

## 2022-10-18 VITALS — BP 132/70 | HR 88 | Temp 98.0°F | Resp 20 | Ht 71.0 in | Wt 223.0 lb

## 2022-10-18 DIAGNOSIS — Z7952 Long term (current) use of systemic steroids: Secondary | ICD-10-CM

## 2022-10-18 DIAGNOSIS — J3089 Other allergic rhinitis: Secondary | ICD-10-CM | POA: Diagnosis not present

## 2022-10-18 DIAGNOSIS — K219 Gastro-esophageal reflux disease without esophagitis: Secondary | ICD-10-CM | POA: Diagnosis not present

## 2022-10-18 DIAGNOSIS — R918 Other nonspecific abnormal finding of lung field: Secondary | ICD-10-CM

## 2022-10-18 DIAGNOSIS — J4489 Other specified chronic obstructive pulmonary disease: Secondary | ICD-10-CM

## 2022-10-18 DIAGNOSIS — T63441D Toxic effect of venom of bees, accidental (unintentional), subsequent encounter: Secondary | ICD-10-CM | POA: Diagnosis not present

## 2022-10-18 DIAGNOSIS — J302 Other seasonal allergic rhinitis: Secondary | ICD-10-CM

## 2022-10-18 NOTE — Progress Notes (Signed)
FOLLOW UP  Date of Service/Encounter:  10/18/22   Assessment:   Severe persistent asthma with COPD overlap - was doing well on Tezspire, but patient is requesting to stop it    Systemic reaction versus panic attack to [place] and [place]    Chronic prednisone use (15+ years) - currently on '5mg'$  daily (with intermittent self-induced bursts up to '20mg'$  daily), obtaining DEXA scan today   Seasonal and perennial allergic rhinitis (grasses, weeds, trees, cat, dog, dust mite, and cockroach)   History of non-compliance   Pulmonary nodules (stable per last chest CT August 2020) - getting repeat today   Complex medical history, including bipolar depression   GERD - on omeprazole   OSA - with history of non-compliance with CPAP    Stinging insect anaphylaxis - interested in starting venom immunotherapy, although his poor follow up, poor compliance, and poor lung function make this unlikely to happen for now  Plan/Recommendations:   1. Severe persistent asthma with COPD overlap - Lung testing looked stable today. - We will stop the Tezspire since you did not think that it was working anyway. - We will refill your oxygen for three months only until you can establish care with Dr. Verlee Monte (you missed an appointment with him last year).  - Pulmonologists are the ones who refill oxygen and manage this and I typically do not do it at all.  - Daily controller medication(s): Brovana (aformoterol) 66mg + Pulmicort (budesonide) 0.5 mg nebulizer treatment TWO TIMES DAILY + Yupelri once daily + montelukast 10 mg ONE TIME DAILY + prednisone '5mg'$  ONCE DAILY - Prior to physical activity: albuterol 2 puffs 10-15 minutes before physical activity. - Rescue medications: albuterol nebulizer one vial every 4-6 hours as needed or DuoNeb nebulizer one vial every 4-6 hours as needed - Asthma control goals:  * Full participation in all desired activities (may need albuterol before activity) * Albuterol use two  time or less a week on average (not counting use with activity) * Cough interfering with sleep two time or less a month * Oral steroids no more  than once a year * No hospitalizations  2. Perennial and seasonal allergic rhinitis - Continue with loratadine '10mg'$  daily as needed. - Continue with fluticasone 1-2 sprays per nostril daily as needed.   3. GERD - Continue with omeprazole '40mg'$  twice daily.    4. Stinging insect anaphylaxis (honeybee, hornets, yellow jacket, wasp) - Continue to avoid insect stings. - EpiPen is up to date.   5. Health maintenance - We can do the RSV vaccine administration (we will schedule this for first morning appointment and plan to stay until lunch). - Pre-medicate with two Allegra 30 minutes before you come in. - Samples of Allegra provided today. - Our goal is to decrease your allergic reaction.   6. Return in about 4 weeks (around 11/15/2022) for RSV VACCINE CHALLENGE.   Subjective:   Jonathon TAWILis a 72y.o. male presenting today for follow up of  Chief Complaint  Patient presents with   Follow-up    Follow up had bad reaction to zyrtec about a week ago bad headache and  having hard time breathing.    RBasilia Jumbohas a history of the following: Patient Active Problem List   Diagnosis Date Noted   Acute [place]ation of chronic obstructive pulmonary disease ([place]D) ([facility] 04/11/2021   Pneumonia due to COVID-19 virus 07/27/2020   COPD with acute exacerbation ([facility] 12/04/2018   PSVT (paroxysmal supraventricular  tachycardia) 09/23/2018   Seasonal and perennial allergic rhinitis 07/30/2018   Asthma-COPD overlap syndrome 07/07/2018   S/P arthroscopy of right shoulder 04/25/2017   Generalized anxiety disorder 04/22/2017   Severe persistent asthma without complication 54/62/7035   Bee sting-induced anaphylaxis 02/12/2017   Anaphylactic reaction due to food, initial encounter 02/12/2017   Current use of beta blocker 02/12/2017   Allergic  rhinitis 01/15/2017   COPD exacerbation ([place]) 11/04/2016   Asthma exacerbation 11/03/2016   Nausea without vomiting 09/17/2016   Shortness of breath    Diabetes mellitus type 2 in obese ([place]) 06/26/2016   non-specific Chest pain 06/12/2016   Patient's noncompliance with other medical treatment and regimen 06/02/2016   Hyperlipidemia, mixed 06/02/2016   Elevated liver enzymes  06/02/2016   Schizophrenia ([place]) 04/30/2016   Environmental and seasonal allergies 04/30/2016   Diabetes mellitus without complication- (diet controlled; w/o proteinuria) 04/30/2016   Heavy alcohol use- Many yrs  04/30/2016   Basal cell carcinoma of back 04/30/2016   Obesity 04/30/2016   Hx of multiple concussions 04/30/2016   h/o Vitamin D deficiency 04/30/2016   Screen for sexually transmitted diseases 04/30/2016   Immunocompromised state ([place]) 04/30/2016   Cervical radiculopathy 01/15/2016   Alcohol withdrawal ([place]) 11/22/2015   Alcohol abuse with alcohol-induced mood disorder ([place]) 11/22/2015   h/o Suicidal thoughts 11/21/2015   Dry mouth 10/07/2015   Anxiety 09/19/2015   OAB (overactive bladder) 07/27/2015   Chronic idiopathic constipation 05/08/2015   Screening for colon cancer    Benign neoplasm of cecum    Benign neoplasm of ascending colon    Benign neoplasm of transverse colon    Benign neoplasm of sigmoid colon    Rectal polyp    Gastroesophageal reflux disease without esophagitis    Esophageal stricture    Joint stiffness of multiple sites 10/17/2014   Benign localized hyperplasia of prostate with urinary obstruction 09/29/2014   concerns for memory loss 09/13/2014   Tuberculosis    Diverticulum of bladder 06/17/2014   Acute urinary retention 05/31/2014   Constipation 04/29/2014   Neuropathy ([place]) 04/11/2014   Post-polio syndrome 01/13/2014   Other emphysema ([place]) 01/13/2014   Hypertension 01/13/2014   Former smoker 01/13/2014   h/o Prostate cancer 01/13/2014   History of tuberculosis  01/13/2014   Bipolar disorder ([place]) 01/13/2014   Explosive personality disorder ([place]) 01/13/2014   Erectile dysfunction 01/13/2014   Nephrolithiasis 01/13/2014   Chronic pain 01/13/2014   Obstructive sleep apnea 00/93/8182   Alcoholic gastritis 99/37/1696   Nocturnal hypoxia 08/10/2013    History obtained from: chart review and patient.  Rhyland is a 72 y.o. male presenting for a follow up visit.  He was last seen in August 2023.  At that time, his lung testing was stable.  He was interested in starting Tezspire to see if this would help with his breathing.  We recommended premedicating with Claritin 2 tablets twice daily for 2 days before and 2 days after the injection to prevent any reactions.  We continued him on Brovana and Pulmicort 2-4 times daily as well as montelukast and prednisone.  He was on prednisone 5 mg daily at the time.  For his rhinitis, we continue with Claritin as well as Flonase 1 to 2 sprays per nostril daily.  We increased his omeprazole to 40 mg twice daily.  His EpiPen was up-to-date for his stinging insect anaphylaxis.  Since last visit, he has missed a number of injection appointments as well as been late for completely missed  multiple appointments.  This includes an appointment he made earlier this week the same day and then he slept through it.  We have been on the verge of firing him as a patient due to his noncompliance.  Of note, he was 15 minutes early for his appointment today.  Asthma/Respiratory Symptom History: He is interested in stopping his Tezspire.  This is not because of any reaction he had to it, but he does not feel like it is working much anyway.  He never really felt much differently.  He has been asking [name] to refill his oxygen several times over the last month or 2, but he continued to missed appointment so we never signed the orders.  We have never actually filled his oxygen.  This is always been a pulmonologist or his primary care provider.  I told  him on multiple occasions that I is an asthma and allergy specialist I am not the ideal person to fill the oxygen order.  I reviewed his chart and he missed an appointment with Dr. Verlee Monte in February 2023.  He denies having any knowledge of this appointments at all.  He has fired a number of pulmonologist in the past.  In any case, he remains on Brovana and Pulmicort.  He is using this twice daily.  He also has Maretta Bees that he adds once a day.  You pallor he has not always been covered by what ever insurance he has, so this has not been a consistent medication that he has taken.  He remains on the montelukast.  He is on prednisone down to 5 mg once a day.  He essentially uses oxygen at night when his pulse ox gets into the low 90s and high 80s.  I think this was originally added after one of his hospitalizations, but I am not entirely sure. ACT is 18, indicating poor asthma control.   Chest CT (August 2023):  IMPRESSION: 1. No acute intrathoracic pathology.  Patent central airways. 2. Linear nodular density in the left upper lobe extending to the apical pleural surface, likely an area of scarring. Follow-up with CT in 3-6 months recommended. 3. Fatty liver.  Allergic Rhinitis Symptom History: He remains on the Claritin daily as well as the Flonase.  He does not take the Claritin routinely.  He has not been on antibiotics.  He has lost a lot of weight and has limited his drinking.  He has been physically active at home with a bunch of projects around his house.  He is open to trying a different antihistamine.  He is interested in getting the RSV vaccine.  However, pharmacies have refused to administer it because of his allergic history.  We have arranged for [name] to get the vaccine and administered in the clinic and monitor him for 4 hours or so.  He tends to have reactions 4 to 8 hours after the injections, but I think if we can monitor him for 4 hours and send him home he should be okay.  A lot of his  reactions have been fairly vague and I have felt in the past that they have been secondary to anxiety.  He is motivated to get the RSV vaccine and is very thankful that we were able to obtain it from the pharmacy.  We had recommended using Zyrtec 1-2 times daily before and after the injection, but apparently he has an adverse reaction to Zyrtec.  There is no DEXA scan in the system.  He is unsure  if he has had 1.  He does see an eye doctor routinely.  His last hemoglobin A1c was 5.7 approximately 6 months ago.  Otherwise, there have been no changes to his past medical history, surgical history, family history, or social history.    Review of Systems  Constitutional: Negative.  Negative for fever, malaise/fatigue and weight loss.  HENT:  Negative for congestion, ear discharge, ear pain and sinus pain.   Eyes:  Negative for pain, discharge and redness.  Respiratory:  Positive for cough and sputum production. Negative for shortness of breath and wheezing.   Cardiovascular: Negative.  Negative for chest pain and palpitations.  Gastrointestinal:  Negative for abdominal pain, heartburn, nausea and vomiting.  Skin: Negative.  Negative for itching and rash.  Neurological:  Negative for dizziness and headaches.  Endo/Heme/Allergies:  Positive for environmental allergies. Does not bruise/bleed easily.       Objective:   Blood pressure 132/70, pulse 88, temperature 98 F (36.7 C), resp. rate 20, height '5\' 11"'$  (1.803 m), weight 223 lb (101.2 kg), SpO2 97 %. Body mass index is 31.1 kg/m.    Physical Exam Vitals reviewed.  Constitutional:      Appearance: He is well-developed.     Comments: In a fairly good mood. Boisterous.   HENT:     Head: Normocephalic and atraumatic.     Right Ear: Tympanic membrane, ear canal and external ear normal.     Left Ear: Tympanic membrane, ear canal and external ear normal.     Nose: No nasal deformity, septal deviation, mucosal edema or rhinorrhea.      Right Turbinates: Enlarged, swollen and pale.     Left Turbinates: Enlarged, swollen and pale.     Right Sinus: No maxillary sinus tenderness or frontal sinus tenderness.     Left Sinus: No maxillary sinus tenderness or frontal sinus tenderness.     Mouth/Throat:     Mouth: Mucous membranes are not pale and not dry.     Pharynx: Uvula midline.     Comments: Cobblestoning present. Eyes:     General: Lids are normal. Allergic shiner present.        Right eye: No discharge.        Left eye: No discharge.     Conjunctiva/sclera: Conjunctivae normal.     Right eye: Right conjunctiva is not injected. No chemosis.    Left eye: Left conjunctiva is not injected. No chemosis.    Pupils: Pupils are equal, round, and reactive to light.  Cardiovascular:     Rate and Rhythm: Normal rate and regular rhythm.     Heart sounds: Normal heart sounds.  Pulmonary:     Effort: Pulmonary effort is normal. No tachypnea, accessory muscle usage or respiratory distress.     Breath sounds: Examination of the right-upper field reveals wheezing. Examination of the left-upper field reveals wheezing. Examination of the right-middle field reveals wheezing. Examination of the left-middle field reveals wheezing. Wheezing present. No rhonchi or rales.     Comments: Diffuse expiratory wheezes.  This seems to be his baseline.  Talking in full sentences. Chest:     Chest wall: No tenderness.  Lymphadenopathy:     Cervical: No cervical adenopathy.  Skin:    Coloration: Skin is not pale.     Findings: No abrasion, erythema, petechiae or rash. Rash is not papular, urticarial or vesicular.  Neurological:     Mental Status: He is alert.  Psychiatric:  Behavior: Behavior is cooperative.      Diagnostic studies: none       [name], MD  [facility]

## 2022-10-18 NOTE — Patient Instructions (Addendum)
1. Severe persistent asthma with COPD overlap - Lung testing looked stable today. - We will stop the Tezspire since you did not think that it was working anyway. - We will refill your oxygen for three months only until you can establish care with Dr. Verlee Monte (you missed an appointment with him last year).       - Pulmonologists are the ones who refill oxygen and manage this and I typically do not do it at all.  - Daily controller medication(s): Brovana (aformoterol) 57mg + Pulmicort (budesonide) 0.5 mg nebulizer treatment TWO TIMES DAILY + Yupelri once daily + montelukast 10 mg ONE TIME DAILY + prednisone '5mg'$  ONCE DAILY - Prior to physical activity: albuterol 2 puffs 10-15 minutes before physical activity. - Rescue medications: albuterol nebulizer one vial every 4-6 hours as needed or DuoNeb nebulizer one vial every 4-6 hours as needed - Asthma control goals:  * Full participation in all desired activities (may need albuterol before activity) * Albuterol use two time or less a week on average (not counting use with activity) * Cough interfering with sleep two time or less a month * Oral steroids no more  than once a year * No hospitalizations  2. Perennial and seasonal allergic rhinitis - Continue with loratadine '10mg'$  daily as needed. - Continue with fluticasone 1-2 sprays per nostril daily as needed.   3. GERD - Continue with omeprazole '40mg'$  twice daily.    4. Stinging insect anaphylaxis (honeybee, hornets, yellow jacket, wasp) - Continue to avoid insect stings. - EpiPen is up to date.   5. Health maintenance - We can do the RSV vaccine administration (we will schedule this for first morning appointment and plan to stay until lunch). - Pre-medicate with two Allegra 30 minutes before you come in. - Samples of Allegra provided today. - Our goal is to decrease your allergic reaction.   6. Return in about 4 weeks (around 11/15/2022) for RSV VACCINE CHALLENGE.    Please inform [place]  any Emergency Department visits, hospitalizations, or changes in symptoms. Call [place]efore going to the ED for breathing or allergy symptoms since we might be able to fit you in for a sick visit. Feel free to contact u[place]anytime with any questions, problems, or concerns.  It was a pleasure to talk to you today today! Tell the missus I said hello!   Websites that have reliable patient information: 1. American Academy of Asthma, Allergy, and Immunology: www.aaaai.org 2. Food Allergy Research and Education (FARE): foodallergy.org 3. Mothers of Asthmatics: http://www.asthmacommunitynetwork.org 4. American College of Allergy, Asthma, and Immunology: www.acaai.org   COVID-19 Vaccine Information can be found at: [email or web address] questions related to vaccine distribution or appointments, please email [email or web address] or call [phone]     "Like" [facility] Facebook and Instagram for our latest updates!       Make sure you are registered to vote! If you have moved or changed any of your contact information, you will need to get this updated before voting!  In some cases, you MAY be able to register to vote online: [email or web address]

## 2022-10-19 ENCOUNTER — Telehealth: Payer: Self-pay | Admitting: Allergy & Immunology

## 2022-10-19 NOTE — Telephone Encounter (Signed)
[name] informed and he will keep his challenge appointment.

## 2022-10-19 NOTE — Telephone Encounter (Signed)
Patient is having a dental procedure on Monday 10-22-2021. Patient states he will be prescribed a lot of medication and antibiotics and would like to know if that could/would interfere with challenge on Tuesday, 10-23-2021.   Please advise  Best contact number: [phone]

## 2022-10-19 NOTE — Telephone Encounter (Signed)
I think he should be fine. His choice, though.   [name], MD [facility]

## 2022-10-22 ENCOUNTER — Encounter: Payer: Self-pay | Admitting: Allergy & Immunology

## 2022-10-23 ENCOUNTER — Other Ambulatory Visit: Payer: Self-pay

## 2022-10-23 ENCOUNTER — Encounter: Payer: Self-pay | Admitting: Family

## 2022-10-23 ENCOUNTER — Ambulatory Visit (INDEPENDENT_AMBULATORY_CARE_PROVIDER_SITE_OTHER): Payer: Medicare (Managed Care) | Admitting: Family

## 2022-10-23 VITALS — BP 130/70 | HR 90 | Temp 98.7°F | Resp 24 | Wt 222.7 lb

## 2022-10-23 DIAGNOSIS — J4489 Other specified chronic obstructive pulmonary disease: Secondary | ICD-10-CM

## 2022-10-23 DIAGNOSIS — J302 Other seasonal allergic rhinitis: Secondary | ICD-10-CM

## 2022-10-23 DIAGNOSIS — T63441D Toxic effect of venom of bees, accidental (unintentional), subsequent encounter: Secondary | ICD-10-CM

## 2022-10-23 DIAGNOSIS — J3089 Other allergic rhinitis: Secondary | ICD-10-CM | POA: Diagnosis not present

## 2022-10-23 DIAGNOSIS — K219 Gastro-esophageal reflux disease without esophagitis: Secondary | ICD-10-CM

## 2022-10-23 DIAGNOSIS — Z0000 Encounter for general adult medical examination without abnormal findings: Secondary | ICD-10-CM | POA: Diagnosis not present

## 2022-10-23 DIAGNOSIS — Z7952 Long term (current) use of systemic steroids: Secondary | ICD-10-CM

## 2022-10-23 NOTE — Progress Notes (Signed)
[place] [address] Dept: [phone]  FOLLOW UP NOTE  Patient ID: [name], male    DOB: [birth year]  Age: 72 y.o. MRN: [id number] Date of Office Visit: 10/23/2022  Assessment  Chief Complaint: Food/Drug Challenge (RSV vaccine. Patient states that he has not taken the full dosage of recommended antihistamines because it makes him drowsy and he would not have been able to drive here. Patient states that he has ended up in the hospital with every vaccine and does not see this one being any different (nausea, vomiting, etc.))  HPI [name] is a 72 year old male who presents today for a graded challenge to the RSV vaccine.  He was last seen on October 18, 2022 by Dr. Ernst Bowler for severe persistent asthma with COPD overlap, perennial and seasonal allergic rhinitis, gastroesophageal reflux disease, stinging insect anaphylaxis, and health maintenance.  He reports approximately 3 weeks ago he went to the pharmacy and received the pneumonia shot and was sick for 4 days.  Health maintenance: He reports that he did not take the 2 Allegra tablets 30 minutes before coming in the office today as recommended by Dr. Ernst Bowler.  He reports that no one told him.  He then mentions that somebody spoke with his wife, but she did not tell him with said.  He did mention that he took 1 tablet of an antihistamine last night and half a tablet this morning.  He reports that if he were to take 2 pills of an antihistamine his wife would have to drive him because he would "be a little loopy".  He mentions that he has reacted to every shot that he has ever had.  His reactions usually do not occur until 10 to 15 hours after the injection.  He  usually cannot breathe or he develops hives.  Severe persistent asthma with COPD overlap.  He continues to use Brovana and Pulmicort 0.5 mg twice a day via nebulizer, Yupelri at lunch, and prednisone 5 mg once a day.  He has not taken montelukast 10 mg once  a day in a long time due to it bothering his stomach.  He reports on Sunday he had a bad coughing spell that felt like he was choking.  Eventually the coughing stopped.  He reports shortness of breath due to his asthma and COPD.  He denies wheezing, tightness in his chest, and nocturnal awakenings due to breathing problems.  Since his last office visit he has not made any trips to the emergency room or urgent care due to breathing problems.  He has not taken any additional steroid burst besides his prednisone 5 mg once a day since his last office visit.  He uses his albuterol or DuoNeb approximately once a day.  He was previously on Tezspire, but this was stopped due to him feeling that it was not working.  Perennial and seasonal allergic rhinitis: He reports nasal congestion and denies rhinorrhea and postnasal drip.  He has not had any sinus infections since we last saw him.  During the winter months he does not take loratadine 10 mg once a day, but he does take this medication in the summer.  He does use over-the-counter fluticasone daily.  Gastroesophageal reflux disease: He denies heartburn or reflux symptoms while taking omeprazole 40 mg twice a day.  Stinging insect anaphylaxis: He reports that he has not had any insect stings since his last office visit and his EpiPen is up to date.  Drug Allergies:  Allergies  Allergen Reactions   Aspirin Anaphylaxis and Other (See Comments)   Hornet Venom Anaphylaxis   Iodinated Contrast Media Anaphylaxis and Other (See Comments)   Levaquin [Levofloxacin In D5w] Shortness Of Breath and Swelling    In addition: sweating, chest pain, and diarrhea.    Nsaids Anaphylaxis and Other (See Comments)   Penicillins Anaphylaxis    Heart stops Has patient had a PCN reaction causing immediate rash, facial/tongue/throat swelling, SOB or lightheadedness with hypotension: yes Has patient had a PCN reaction causing severe rash involving mucus membranes or skin  necrosis: NO Has patient had a PCN reaction that required hospitalization yes Has patient had a PCN reaction occurring within the last 10 years: No If all of the above answers are "NO", then may proceed with Cephalosporin use.    Tolmetin Anaphylaxis   Fasenra [Benralizumab] Hives   Perforomist [Formoterol]     Increased wheezing, shortness of breath   Buprenorphine Hcl Nausea And Vomiting    Can take with zofran    Morphine And Related Nausea And Vomiting    Can take with zofran    Oxycodone Itching and Rash    Review of Systems: Review of Systems  Constitutional:  Negative for chills and fever.  HENT:         Reports nasal congestion and denies rhinorrhea and post nasal drip  Eyes:        Denies itchy watery eyes  Respiratory:  Positive for cough and shortness of breath. Negative for wheezing.        He reports this past Sunday he had a bad coughing spell and felt like he was choking.  It eventually stopped.  He also reports shortness of breath due to asthma/COPD.  Denies wheezing, tightness in chest, and nocturnal awakenings due to breathing problems  Cardiovascular:  Negative for chest pain and palpitations.  Gastrointestinal:        Denies heartburn or reflux symptoms on omeprazole twice a day  Genitourinary:  Negative for frequency.  Skin:  Negative for itching and rash.  Neurological:  Negative for headaches.  Endo/Heme/Allergies:  Positive for environmental allergies.     Physical Exam: BP 130/70   Pulse 90   Temp 98.7 F (37.1 C) (Temporal)   Resp (!) 24   Wt 222 lb 11.2 oz (101 kg)   SpO2 94%   BMI 31.06 kg/m    Physical Exam Constitutional:      Appearance: Normal appearance.  HENT:     Head: Normocephalic and atraumatic.     Comments: Pharynx normal. Eyes normal. Ears normal. Nose normal    Right Ear: Tympanic membrane, ear canal and external ear normal.     Left Ear: Tympanic membrane, ear canal and external ear normal.     Nose: Nose normal.      Mouth/Throat:     Mouth: Mucous membranes are moist.     Pharynx: Oropharynx is clear.  Eyes:     Conjunctiva/sclera: Conjunctivae normal.  Cardiovascular:     Rate and Rhythm: Normal rate and regular rhythm.     Heart sounds: Normal heart sounds.  Pulmonary:     Effort: Pulmonary effort is normal.     Breath sounds: Normal breath sounds.     Comments: Lungs clear to ausucltation Musculoskeletal:     Cervical back: Neck supple.  Skin:    General: Skin is warm.  Neurological:     Mental Status: He is alert and oriented to person,  place, and time.  Psychiatric:        Mood and Affect: Mood normal.        Behavior: Behavior normal.        Thought Content: Thought content normal.        Judgment: Judgment normal.     Diagnostics:  None  Assessment and Plan: 1. Healthcare maintenance   2. Asthma-COPD overlap syndrome   3. Anaphylactic reaction to bee sting, accidental or unintentional, subsequent encounter   4. Seasonal and perennial allergic rhinitis   5. Gastroesophageal reflux disease without esophagitis   6. On prednisone therapy     No orders of the defined types were placed in this encounter.   Patient Instructions  1. Severe persistent asthma with COPD overlap - Daily controller medication(s): Brovana (aformoterol) 68mg + Pulmicort (budesonide) 0.5 mg nebulizer treatment TWO TIMES DAILY + Yupelri once daily + prednisone '5mg'$  ONCE DAILY - Prior to physical activity: albuterol 2 puffs 10-15 minutes before physical activity. - Rescue medications: albuterol nebulizer one vial every 4-6 hours as needed or DuoNeb nebulizer one vial every 4-6 hours as needed - Asthma control goals:  * Full participation in all desired activities (may need albuterol before activity) * Albuterol use two time or less a week on average (not counting use with activity) * Cough interfering with sleep two time or less a month * Oral steroids no more  than once a year * No hospitalizations  2.  Perennial and seasonal allergic rhinitis - Continue with loratadine '10mg'$  daily as needed. - Continue with fluticasone 1-2 sprays per nostril daily as needed.   3. GERD - Continue with omeprazole '40mg'$  twice daily.    4. Stinging insect anaphylaxis (honeybee, hornets, yellow jacket, wasp) - Continue to avoid insect stings. - EpiPen is up to date.   5. Health maintenance - We can do the RSV vaccine administration (we will schedule this for first morning appointment and plan to stay until lunch). - Pre-medicate with two Allegra 30 minutes before you come in. - Make sure your wife drives you since antihistamines make you sleepy. - Bring your lunch  with you - Our goal is to decrease your allergic reaction.   6. Keep your scheduled appointment on 10/29/22 @ 8:30 AM for the RSV Vaccine challenge  Return in about 6 days (around 10/29/2022), or if symptoms worsen or fail to improve.    Thank you for the opportunity to care for this patient.  Please do not hesitate to contact me with questions.  CAlthea Charon FNP [facility]

## 2022-10-23 NOTE — Patient Instructions (Addendum)
1. Severe persistent asthma with COPD overlap - Daily controller medication(s): Brovana (aformoterol) 80mg + Pulmicort (budesonide) 0.5 mg nebulizer treatment TWO TIMES DAILY + Yupelri once daily + prednisone '5mg'$  ONCE DAILY - Prior to physical activity: albuterol 2 puffs 10-15 minutes before physical activity. - Rescue medications: albuterol nebulizer one vial every 4-6 hours as needed or DuoNeb nebulizer one vial every 4-6 hours as needed - Asthma control goals:  * Full participation in all desired activities (may need albuterol before activity) * Albuterol use two time or less a week on average (not counting use with activity) * Cough interfering with sleep two time or less a month * Oral steroids no more  than once a year * No hospitalizations  2. Perennial and seasonal allergic rhinitis - Continue with loratadine '10mg'$  daily as needed. - Continue with fluticasone 1-2 sprays per nostril daily as needed.   3. GERD - Continue with omeprazole '40mg'$  twice daily.    4. Stinging insect anaphylaxis (honeybee, hornets, yellow jacket, wasp) - Continue to avoid insect stings. - EpiPen is up to date.   5. Health maintenance - We can do the RSV vaccine administration (we will schedule this for first morning appointment and plan to stay until lunch). - Pre-medicate with two Allegra 30 minutes before you come in. - Make sure your wife drives you since antihistamines make you sleepy. - Bring your lunch  with you - Our goal is to decrease your allergic reaction.   6. Keep your scheduled appointment on 10/29/22 @ 8:30 AM for the RSV Vaccine challenge

## 2022-10-25 ENCOUNTER — Telehealth: Payer: Self-pay

## 2022-10-25 NOTE — Telephone Encounter (Signed)
[facility] [phone] - called in - DOB verified - advised patient has changed his PCP to Dr. Loura Pardon and wanted to know if a new referral would be needed for our office.  Santiago Glad was advised no new referral is needed.  Santiago Glad verbalized understanding, no further questions.  Patient's PCP was updated as well.

## 2022-10-29 ENCOUNTER — Encounter: Payer: Medicare (Managed Care) | Admitting: Family

## 2022-11-05 ENCOUNTER — Inpatient Hospital Stay (HOSPITAL_BASED_OUTPATIENT_CLINIC_OR_DEPARTMENT_OTHER): Admission: RE | Admit: 2022-11-05 | Payer: Medicare (Managed Care) | Source: Ambulatory Visit

## 2022-11-08 ENCOUNTER — Encounter: Payer: Self-pay | Admitting: Family Medicine

## 2022-11-08 ENCOUNTER — Other Ambulatory Visit: Payer: Self-pay | Admitting: Family Medicine

## 2022-11-08 ENCOUNTER — Institutional Professional Consult (permissible substitution): Payer: Medicare (Managed Care) | Admitting: Student

## 2022-11-08 ENCOUNTER — Ambulatory Visit (INDEPENDENT_AMBULATORY_CARE_PROVIDER_SITE_OTHER): Payer: Medicare (Managed Care) | Admitting: Family Medicine

## 2022-11-08 ENCOUNTER — Ambulatory Visit
Admission: RE | Admit: 2022-11-08 | Discharge: 2022-11-08 | Disposition: A | Discharge status: Home / Self Care | Payer: Medicare (Managed Care) | Source: Ambulatory Visit | Attending: Family Medicine | Admitting: Family Medicine

## 2022-11-08 ENCOUNTER — Other Ambulatory Visit: Payer: Self-pay

## 2022-11-08 VITALS — BP 128/78 | HR 78 | Temp 99.4°F | Resp 22

## 2022-11-08 DIAGNOSIS — J209 Acute bronchitis, unspecified: Secondary | ICD-10-CM

## 2022-11-08 DIAGNOSIS — J4489 Other specified chronic obstructive pulmonary disease: Secondary | ICD-10-CM

## 2022-11-08 DIAGNOSIS — J302 Other seasonal allergic rhinitis: Secondary | ICD-10-CM

## 2022-11-08 DIAGNOSIS — K219 Gastro-esophageal reflux disease without esophagitis: Secondary | ICD-10-CM

## 2022-11-08 DIAGNOSIS — B349 Viral infection, unspecified: Secondary | ICD-10-CM | POA: Diagnosis not present

## 2022-11-08 DIAGNOSIS — J4551 Severe persistent asthma with (acute) exacerbation: Secondary | ICD-10-CM | POA: Diagnosis not present

## 2022-11-08 DIAGNOSIS — R052 Subacute cough: Secondary | ICD-10-CM

## 2022-11-08 DIAGNOSIS — T63481D Toxic effect of venom of other arthropod, accidental (unintentional), subsequent encounter: Secondary | ICD-10-CM

## 2022-11-08 DIAGNOSIS — J3089 Other allergic rhinitis: Secondary | ICD-10-CM

## 2022-11-08 DIAGNOSIS — Z0000 Encounter for general adult medical examination without abnormal findings: Secondary | ICD-10-CM

## 2022-11-08 MED ORDER — BUDESONIDE 0.5 MG/2ML IN SUSP: 0.5000 mg | Freq: Two times a day (BID) | RESPIRATORY_TRACT | 5 refills | Status: DC

## 2022-11-08 MED ORDER — DOXYCYCLINE MONOHYDRATE 100 MG PO TABS: 100.0000 mg | ORAL_TABLET | Freq: Two times a day (BID) | ORAL | 0 refills | Status: DC

## 2022-11-08 MED ORDER — PREDNISONE 10 MG PO TABS: ORAL_TABLET | ORAL | 0 refills | Status: DC

## 2022-11-08 NOTE — Progress Notes (Signed)
[place] [address] Dept: [phone]  FOLLOW UP NOTE  Patient ID: [name], male    DOB: 1951-09-02  Age: 72 y.o. MRN: [id number] Date of Office Visit: 11/08/2022  Assessment  Chief Complaint: Cough, Headache, Breathing Problem, Fever, and Sneezing  HPI [name] is a 72 year old male who presents to the clinic for evaluation of asthma exacerbation. He was last seen in this clinic on 10/23/2022 by Jonathon Charon, FNP, for evaluation of asthma, allergic rhinitis, reflux, stinging insect allergy, and vaccine hesitancy. In the interim, he reports that his wife had a "cold" about 2 weeks ago. He reports that about 10 days ago, he began to experience fever, chills, and body aches in addition to shortness of breath, wheeze, cough, and nasal symptoms.   At today's visit, he reports his asthma has been poorly controlled over the last 10 days with symptoms including shortness of breath, cough which is reported as readily and dry, and wheeze occurring in the daytime and nighttime.  He continues Brovana twice a day, Pulmicort 3 times a day, and Yupelri 1 time a day.  He reports using albuterol 5-6 times a day and using DuoNeb 5 times a day over the last 10 days.  He has increased his daily prednisone from 5 mg to 10 mg daily over the last 10 days.  He continues Best boy over the last several days with no relief of cough. He continues intermittent oxygen 2-3 LNC.   Allergic rhinitis is reported as poorly controlled with symptoms including thick yellow nasal drainage, occasional nasal congestion, sneezing, and he denies postnasal drainage.  He reports using what ever nasal sprays are on the counter which include Flonase, azelastine, nasal saline rinses.  He continues cetirizine over the last 10 days and has used over-the-counter preparations including Zicam and Mucinex.  Reflux is reported as poorly controlled with heartburn as the main symptom which occurs several days  a week.  He reports that he has tried omeprazole which makes his reflux worse and is currently taking Tums " like candy".   He continues to avoid stinging insects with no incidences since his last visit to this clinic.  EpiPen's are up-to-date.  He has not yet received his RSV graded dose vaccine.  His current medications are listed in the chart.   Drug Allergies:  Allergies  Allergen Reactions   Aspirin Anaphylaxis and Other (See Comments)   Hornet Venom Anaphylaxis   Iodinated Contrast Media Anaphylaxis and Other (See Comments)   Levaquin [Levofloxacin In D5w] Shortness Of Breath and Swelling    In addition: sweating, chest pain, and diarrhea.    Nsaids Anaphylaxis and Other (See Comments)   Penicillins Anaphylaxis    Heart stops Has patient had a PCN reaction causing immediate rash, facial/tongue/throat swelling, SOB or lightheadedness with hypotension: yes Has patient had a PCN reaction causing severe rash involving mucus membranes or skin necrosis: NO Has patient had a PCN reaction that required hospitalization yes Has patient had a PCN reaction occurring within the last 10 years: No If all of the above answers are "NO", then may proceed with Cephalosporin use.    Tolmetin Anaphylaxis   Fasenra [Benralizumab] Hives   Perforomist [Formoterol]     Increased wheezing, shortness of breath   Buprenorphine Hcl Nausea And Vomiting    Can take with zofran    Morphine And Related Nausea And Vomiting    Can take with zofran    Oxycodone  Itching and Rash    Physical Exam: BP 128/78   Pulse 78   Temp 99.4 F (37.4 C) (Temporal)   Resp (!) 22   SpO2 94%    Physical Exam Vitals reviewed.  Constitutional:      Appearance: Normal appearance. He is well-developed.  HENT:     Head: Normocephalic and atraumatic.     Right Ear: Tympanic membrane normal.     Left Ear: Tympanic membrane normal.     Ears:     Comments: Copious cerumen noted bilateral ears    Nose:     Comments:  Bilateral nares erythematous with thin clear nasal drainage noted.  Pharynx normal.  Ears normal with copious cerumen.  Eyes normal.    Mouth/Throat:     Pharynx: Oropharynx is clear.  Eyes:     Conjunctiva/sclera: Conjunctivae normal.  Cardiovascular:     Rate and Rhythm: Normal rate and regular rhythm.     Heart sounds: Normal heart sounds. No murmur heard. Pulmonary:     Effort: Pulmonary effort is normal.     Breath sounds: Normal breath sounds.     Comments: Bilateral expiratory wheeze.  Scattered rhonchi which partially cleared with cough Musculoskeletal:        General: Normal range of motion.     Cervical back: Normal range of motion and neck supple.  Skin:    General: Skin is warm and dry.  Neurological:     Mental Status: He is alert and oriented to person, place, and time.  Psychiatric:        Mood and Affect: Mood normal.        Behavior: Behavior normal.        Thought Content: Thought content normal.        Judgment: Judgment normal.     Diagnostics: Deferred due to low-grade fever  Assessment and Plan: 1. Asthma-COPD overlap syndrome   2. Severe persistent asthma with acute bronchitis and acute exacerbation   3. Viral illness   4. Subacute cough   5. Seasonal and perennial allergic rhinitis   6. Gastroesophageal reflux disease without esophagitis   7. Insect sting allergy, current reaction, accidental or unintentional, subsequent encounter   8. Healthcare maintenance     Meds ordered this encounter  Medications   predniSONE (DELTASONE) 10 MG tablet    Sig: Begin prednisone 10 mg tablets. Take 2 tablets twice a day for 3 days, then take 2 tablets once a day for 1 day, then take 1 tablet on the 5th day, then stop    Dispense:  15 tablet    Refill:  0   budesonide (PULMICORT) 0.5 MG/2ML nebulizer solution    Sig: Take 2 mLs (0.5 mg total) by nebulization 2 (two) times daily.    Dispense:  360 mL    Refill:  5    Dx Code: J44.9 Please file with Medicare  Part B.    Patient Instructions  Severe persistent asthma with COPD overlap with acute exacerbation For current flare with cough: Begin prednisone 10 mg tablets. Take 2 tablets twice a day for 3 days, then take 2 tablets once a day for 1 day, then take 1 tablet on the 5th day, then stop  Get a chest xray. We will call you when the result becomes available Continue Brovana (aformoterol) 32mg + Pulmicort (budesonide) 0.5 mg nebulizer treatment FOUR TIMES DAILY + Yupelri once daily + prednisone 5 day taper.  When breathing returns to baseline: Continue Brovana (aformoterol)  50mg + Pulmicort (budesonide) 0.5 mg nebulizer treatment TWO TIMES DAILY + Yupelri once daily + prednisone 5 mg once a day - Prior to physical activity: albuterol 2 puffs 10-15 minutes before physical activity. - Rescue medications: albuterol nebulizer one vial every 4-6 hours as needed or DuoNeb nebulizer one vial every 4-6 hours as needed - Asthma control goals:  * Full participation in all desired activities (may need albuterol before activity) * Albuterol use two time or less a week on average (not counting use with activity) * Cough interfering with sleep two time or less a month * Oral steroids no more  than once a year * No hospitalizations  Viral illness Continue with increased fluid intake as tolerated Stop your antihistamine for the next 5 days (cetirizine) Continue Mucinex [phone] mg twice a day Continue over the counter pain reliever as needed Continue to mask and isolate until you are fever free and symptom free COVID testing in the clinic was negative  Perennial and seasonal allergic rhinitis Continue with loratadine '10mg'$  daily as needed. Continue with fluticasone 1-2 sprays per nostril daily as needed.   GERD Continue dietary and lifestyle modifications as listed below Begin pantoprazole 40 mg once a day. Take this medication 30 minutes before your first meal  Stinging insect anaphylaxis (honeybee,  hornets, yellow jacket, wasp) Continue to avoid insect stings. EpiPen is up to date.   Health maintenance We can do the RSV vaccine administration (we will schedule this for first morning appointment and plan to stay until lunch). Pre-medicate with two Allegra 30 minutes before you come in. Make sure your wife drives you since antihistamines make you sleepy. Bring your lunch  with you Our goal is to decrease your allergic reaction.   Call the clinic if this treatment plan is not working well for you  Follow up in 2 weeks or sooner if needed.   No follow-ups on file.    Thank you for the opportunity to care for this patient.  Please do not hesitate to contact me with questions.  AGareth Morgan FNP [facility]

## 2022-11-08 NOTE — Patient Instructions (Signed)
Severe persistent asthma with COPD overlap with acute exacerbation For current flare with cough: Begin prednisone 10 mg tablets. Take 2 tablets twice a day for 3 days, then take 2 tablets once a day for 1 day, then take 1 tablet on the 5th day, then stop  Get a chest xray. We will call you when the result becomes available Continue Brovana (aformoterol) 52mg + Pulmicort (budesonide) 0.5 mg nebulizer treatment FOUR TIMES DAILY + Yupelri once daily + prednisone 5 day taper.  When breathing returns to baseline: Continue Brovana (aformoterol) 143m + Pulmicort (budesonide) 0.5 mg nebulizer treatment TWO TIMES DAILY + Yupelri once daily + prednisone 5 mg once a day - Prior to physical activity: albuterol 2 puffs 10-15 minutes before physical activity. - Rescue medications: albuterol nebulizer one vial every 4-6 hours as needed or DuoNeb nebulizer one vial every 4-6 hours as needed - Asthma control goals:  * Full participation in all desired activities (may need albuterol before activity) * Albuterol use two time or less a week on average (not counting use with activity) * Cough interfering with sleep two time or less a month * Oral steroids no more  than once a year * No hospitalizations  Viral illness Continue with increased fluid intake as tolerated Stop your antihistamine for the next 5 days (cetirizine) Continue Mucinex [phone] mg twice a day Continue over the counter pain reliever as needed Continue to mask and isolate until you are fever free and symptom free COVID testing in the clinic was negative  Perennial and seasonal allergic rhinitis Continue with loratadine '10mg'$  daily as needed. Continue with fluticasone 1-2 sprays per nostril daily as needed.   GERD Continue dietary and lifestyle modifications as listed below Begin pantoprazole 40 mg once a day. Take this medication 30 minutes before your first meal  Stinging insect anaphylaxis (honeybee, hornets, yellow jacket,  wasp) Continue to avoid insect stings. EpiPen is up to date.   Health maintenance We can do the RSV vaccine administration (we will schedule this for first morning appointment and plan to stay until lunch). Pre-medicate with two Allegra 30 minutes before you come in. Make sure your wife drives you since antihistamines make you sleepy. Bring your lunch  with you Our goal is to decrease your allergic reaction.   Call the clinic if this treatment plan is not working well for you  Follow up in 2 weeks or sooner if needed.

## 2022-11-08 NOTE — Progress Notes (Addendum)
Can you please let this patient know that I am calling in an antibiotic for him. Please have him continue to follow the treatment plan he received from the clinic earlier today. Thank you

## 2022-11-15 ENCOUNTER — Other Ambulatory Visit: Payer: Medicare (Managed Care)

## 2022-11-21 ENCOUNTER — Telehealth: Payer: Self-pay | Admitting: Family Medicine

## 2022-11-21 MED ORDER — ALBUTEROL SULFATE HFA 108 (90 BASE) MCG/ACT IN AERS: 2.0000 | INHALATION_SPRAY | RESPIRATORY_TRACT | 5 refills | Status: DC | PRN

## 2022-11-21 NOTE — Telephone Encounter (Signed)
Patient called and stated he needs a prescription for albuterol inhaler sent to [facility] [place], [place] [address] Phone: [phone]  Fax: [phone]

## 2022-11-21 NOTE — Telephone Encounter (Signed)
Sent in rx to [address]

## 2022-11-22 ENCOUNTER — Institutional Professional Consult (permissible substitution): Payer: Medicare (Managed Care) | Admitting: Pulmonary Disease

## 2023-03-05 ENCOUNTER — Telehealth: Payer: Self-pay | Admitting: Allergy & Immunology

## 2023-03-05 NOTE — Telephone Encounter (Signed)
Is it ok for the patient to receive both vaccines in at the pharmacy together?

## 2023-03-05 NOTE — Telephone Encounter (Signed)
Patient called and said that he is suppose to get a covid and a RSV shot together tomorrow at the pharmacy. Please call [phone]

## 2023-03-05 NOTE — Telephone Encounter (Signed)
I think it is perfectly fine to do that.  We still have an RSV vaccine that we ordered a a while ago. We can give it here, but he has failed to keep appointments.   Malachi Bonds, MD [facility]

## 2023-03-05 NOTE — Telephone Encounter (Signed)
I called and spoke with the patient and he stated that the pharmacist at [facility] wants him to get the COVID vaccine and the RSV vaccine tomorrow together. He asked the pharmacist what are the chances of allergic reaction and they just listed that possible symptoms that could follow the vaccines. He did confirm that he has received COVID vaccines in the past and has not had issue other then headache or body aches. I advised it would be best for him to just get the COVID vaccine tomorrow and not both together given his history of allergic reactions. Patient verbalized understanding and will just get the COVID vaccine tomorrow.

## 2023-03-06 NOTE — Telephone Encounter (Signed)
Attempted to call patient to see how he was doing after his vaccine at the pharmacy. There was no answer and voicemail was not set up. Will need to attempt to call again.

## 2023-03-06 NOTE — Telephone Encounter (Signed)
Sounds good. I reacts to many medications, so I hate for him to react at [facility].   Please encourage him to take an extra antihistamine today.   Malachi Bonds, MD [facility]

## 2023-03-08 NOTE — Telephone Encounter (Signed)
Called and spoke with the patient and he stated that he did not get the injections done because they were very persistent that he do both the COVID and RSV together and they told him that he should go to a different pharmacy to get the shots. So he stated that he is going to go to [facility] next week to get the COVID shot, he then stated that he will call us to get scheduled for the RSV shot, do we still have this vaccine? He then stated that his insurance is changing to [facility]), I advised to bring in his new insurance card when he receives it so that we can put it into his chart.

## 2023-03-08 NOTE — Telephone Encounter (Signed)
We still have the vaccine. I am routing to [place] to confirm.  Malachi Bonds, MD [facility]

## 2023-03-08 NOTE — Telephone Encounter (Signed)
Yes we still have the vaccine in office.

## 2023-04-11 ENCOUNTER — Telehealth: Payer: Self-pay

## 2023-04-11 NOTE — Telephone Encounter (Addendum)
Patient called stating he received a letter from [facility] regarding a machine he does not use. Patient states she is going to contact his lawyer on [name] for sending in something he doesn't use. He did hang up on me as I was trying to get more understanding of the machine he was referring to.   Please advise

## 2023-04-12 NOTE — Telephone Encounter (Signed)
Called however was not able to leave a message due to voicemail not being set up. Will try again on Monday.

## 2023-04-17 NOTE — Telephone Encounter (Signed)
Spoke with patient, informed him that Dr. Dellis Anes was not the one who authorized his oxygen concentrator. He stated that he went to the hospital with breathing issues and is wondering if they are the ones who authorized it. I informed him to call [facility] to see who authorized it. I also informed him that if he isn't using it that he could have them pick it up. Patient verbalized understanding.

## 2023-04-22 NOTE — Telephone Encounter (Signed)
Noted - thanks for the update 

## 2023-05-09 ENCOUNTER — Telehealth: Payer: Self-pay

## 2023-05-09 DIAGNOSIS — R918 Other nonspecific abnormal finding of lung field: Secondary | ICD-10-CM

## 2023-05-09 DIAGNOSIS — J4489 Other specified chronic obstructive pulmonary disease: Secondary | ICD-10-CM

## 2023-05-09 DIAGNOSIS — R0902 Hypoxemia: Secondary | ICD-10-CM

## 2023-05-09 NOTE — Telephone Encounter (Signed)
Patient called stating he needs a different oxygen concentrator that will be easier to take around, as the one he has is like caring a mini refrigerator. He really wants to travel more with his wife and needs one that is more compact.  He has [employer] now. He wants to use [facility] in [facility]

## 2023-05-09 NOTE — Telephone Encounter (Signed)
Spoke with patient regarding his oxygen concentrator and he informed me that he went to the hospital and after being discharged he came home and it was on his porch. Patient stated he does not have a pulmonologist. He stated that he had an appointment and when he went he was informed that the provider was out of the office and he was never informed. He tried to reschedule however they could not get him in for 6 months. Patient is requesting Dr. Dellis Anes refer him to a pulmonologist. Please route to the [facility] pool as I will be out tomorrow.

## 2023-05-14 NOTE — Telephone Encounter (Signed)
I believe he has fired multiple pulmonologists. I will put in a referral anyway.   Malachi Bonds, MD [facility]

## 2023-05-24 NOTE — Telephone Encounter (Signed)
Patient has no showed and canceled with [facility] multiple times. They may not schedule him again, but I did place the referral to their office for review.

## 2023-05-30 ENCOUNTER — Telehealth: Payer: Self-pay

## 2023-05-30 NOTE — Telephone Encounter (Addendum)
[facility] faxed confirmation order for Arformoterol (Brovana) 15 mcg; Budesonide (Pulmicort) 0.5 mg/2 mL; Ipratropium-Albuterol (DUONEB) 0.5-2.5 mg/ 3mL  - DOB verified - form has been placed in provider's in basket to review/complete/sign.  Forwarding message to provider.

## 2023-05-31 NOTE — Telephone Encounter (Signed)
Signed.   Malachi Bonds, MD [facility]

## 2023-06-03 ENCOUNTER — Other Ambulatory Visit: Payer: Self-pay | Admitting: Allergy & Immunology

## 2023-06-03 NOTE — Telephone Encounter (Signed)
Forms have been faxed to [facility] at [phone].

## 2023-06-03 NOTE — Telephone Encounter (Signed)
Forms have been placed in bulk scanning.

## 2023-06-20 NOTE — Telephone Encounter (Signed)
Patient states he has had a bad experience with [facility] and will not be seeing them.   Advised patient to reach out to his insurance and find out which pulmonary office is in network, advised him to give our office a call with office information and we would send referral then. Patient verbalized understanding.   Patient also states he changed his supplement plan to cigna supplement and wanted to know if we are in net work. Advised patient to reach out to insurance to verify our office and Dr. Dellis Anes is in network with the supplement plan. Patient stated he did so and was told we are in network. Advised patient he should be fine as that information came from the insurance company. Patient verbalized understanding.

## 2023-07-02 ENCOUNTER — Encounter (HOSPITAL_COMMUNITY): Payer: Self-pay

## 2023-07-02 ENCOUNTER — Other Ambulatory Visit: Payer: Self-pay

## 2023-07-02 ENCOUNTER — Emergency Department (HOSPITAL_COMMUNITY): Payer: Medicare Other

## 2023-07-02 ENCOUNTER — Emergency Department (HOSPITAL_COMMUNITY)
Admission: EM | Admit: 2023-07-02 | Discharge: 2023-07-02 | Discharge status: Against Medical Advice | Payer: Medicare Other | Attending: Emergency Medicine | Admitting: Emergency Medicine

## 2023-07-02 DIAGNOSIS — R1031 Right lower quadrant pain: Secondary | ICD-10-CM | POA: Insufficient documentation

## 2023-07-02 DIAGNOSIS — Z5321 Procedure and treatment not carried out due to patient leaving prior to being seen by health care provider: Secondary | ICD-10-CM | POA: Insufficient documentation

## 2023-07-02 DIAGNOSIS — R110 Nausea: Secondary | ICD-10-CM | POA: Diagnosis not present

## 2023-07-02 LAB — LIPASE, BLOOD: Lipase: 39 U/L (ref 11–51)

## 2023-07-02 LAB — CBC WITH DIFFERENTIAL/PLATELET
Abs Immature Granulocytes: 0.03 10*3/uL (ref 0.00–0.07)
Basophils Absolute: 0.1 10*3/uL (ref 0.0–0.1)
Basophils Relative: 1 %
Eosinophils Absolute: 0.4 10*3/uL (ref 0.0–0.5)
Eosinophils Relative: 6 %
HCT: 45.1 % (ref 39.0–52.0)
Hemoglobin: 14.9 g/dL (ref 13.0–17.0)
Immature Granulocytes: 1 %
Lymphocytes Relative: 19 %
Lymphs Abs: 1.2 10*3/uL (ref 0.7–4.0)
MCH: 32.3 pg (ref 26.0–34.0)
MCHC: 33 g/dL (ref 30.0–36.0)
MCV: 97.8 fL (ref 80.0–100.0)
Monocytes Absolute: 0.5 10*3/uL (ref 0.1–1.0)
Monocytes Relative: 8 %
Neutro Abs: 4.1 10*3/uL (ref 1.7–7.7)
Neutrophils Relative %: 65 %
Platelets: 245 10*3/uL (ref 150–400)
RBC: 4.61 MIL/uL (ref 4.22–5.81)
RDW: 12.8 % (ref 11.5–15.5)
WBC: 6.3 10*3/uL (ref 4.0–10.5)
nRBC: 0 % (ref 0.0–0.2)

## 2023-07-02 LAB — COMPREHENSIVE METABOLIC PANEL
ALT: 30 U/L (ref 0–44)
AST: 27 U/L (ref 15–41)
Albumin: 4 g/dL (ref 3.5–5.0)
Alkaline Phosphatase: 62 U/L (ref 38–126)
Anion gap: 9 (ref 5–15)
BUN: 17 mg/dL (ref 8–23)
CO2: 24 mmol/L (ref 22–32)
Calcium: 8.9 mg/dL (ref 8.9–10.3)
Chloride: 99 mmol/L (ref 98–111)
Creatinine, Ser: 1.07 mg/dL (ref 0.61–1.24)
GFR, Estimated: >60 mL/min (ref >60)
Glucose, Bld: 120 mg/dL — ABNORMAL HIGH (ref 70–99)
Potassium: 4.5 mmol/L (ref 3.5–5.1)
Sodium: 132 mmol/L — ABNORMAL LOW (ref 135–145)
Total Bilirubin: 0.7 mg/dL (ref 0.3–1.2)
Total Protein: 7.6 g/dL (ref 6.5–8.1)

## 2023-07-02 NOTE — ED Provider Triage Note (Signed)
Emergency Medicine Provider Triage Evaluation Note  [name] , a 72 y.o. male  was evaluated in triage.  Pt complains of RLQ pain since yesterday. States the abdominal pain is bad and makes his head hurt. Endorses nausea, no vomiting. Last BM was yesterday, normal stool. Denies fever.  Review of Systems  Positive: As above Negative: As above  Physical Exam  BP (!) 150/96 (BP Location: Left Arm)   Pulse (!) 58   Temp 98 F (36.7 C) (Oral)   Resp 18   Ht 6' (1.829 m)   Wt 101.2 kg   SpO2 97%   BMI 30.24 kg/m  Gen:   Awake, no distress   Resp:  Normal effort  MSK:   Moves extremities without difficulty  Other:    Medical Decision Making  Medically screening exam initiated at 4:58 PM.  Appropriate orders placed.  [name] was informed that the remainder of the evaluation will be completed by another provider, this initial triage assessment does not replace that evaluation, and the importance of remaining in the ED until their evaluation is complete.    Jeanelle Malling, [place] 07/02/23 1702

## 2023-07-02 NOTE — ED Triage Notes (Addendum)
Patient BIB [facility] from home. Complaining of right lower quadrant abdominal pain since yesterday morning. Has rebound tenderness. Feels nauseous.

## 2023-07-11 ENCOUNTER — Ambulatory Visit: Payer: Medicare Other | Admitting: Allergy & Immunology

## 2023-07-30 ENCOUNTER — Other Ambulatory Visit: Payer: Self-pay

## 2023-08-02 ENCOUNTER — Other Ambulatory Visit: Payer: Self-pay

## 2023-08-02 ENCOUNTER — Encounter (HOSPITAL_BASED_OUTPATIENT_CLINIC_OR_DEPARTMENT_OTHER): Payer: Self-pay

## 2023-08-02 ENCOUNTER — Emergency Department (HOSPITAL_BASED_OUTPATIENT_CLINIC_OR_DEPARTMENT_OTHER): Payer: Medicare Other

## 2023-08-02 ENCOUNTER — Emergency Department (HOSPITAL_BASED_OUTPATIENT_CLINIC_OR_DEPARTMENT_OTHER)
Admission: EM | Admit: 2023-08-02 | Discharge: 2023-08-02 | Disposition: A | Discharge status: Home / Self Care | Payer: Medicare Other | Attending: Emergency Medicine | Admitting: Emergency Medicine

## 2023-08-02 DIAGNOSIS — S0990XA Unspecified injury of head, initial encounter: Secondary | ICD-10-CM | POA: Diagnosis present

## 2023-08-02 DIAGNOSIS — R0789 Other chest pain: Secondary | ICD-10-CM | POA: Diagnosis not present

## 2023-08-02 DIAGNOSIS — W01198A Fall on same level from slipping, tripping and stumbling with subsequent striking against other object, initial encounter: Secondary | ICD-10-CM | POA: Diagnosis not present

## 2023-08-02 DIAGNOSIS — W19XXXA Unspecified fall, initial encounter: Secondary | ICD-10-CM

## 2023-08-02 LAB — BASIC METABOLIC PANEL
Anion gap: 10 (ref 5–15)
BUN: 14 mg/dL (ref 8–23)
CO2: 25 mmol/L (ref 22–32)
Calcium: 9.3 mg/dL (ref 8.9–10.3)
Chloride: 100 mmol/L (ref 98–111)
Creatinine, Ser: 1.19 mg/dL (ref 0.61–1.24)
GFR, Estimated: >60 mL/min (ref >60)
Glucose, Bld: 111 mg/dL — ABNORMAL HIGH (ref 70–99)
Potassium: 4.5 mmol/L (ref 3.5–5.1)
Sodium: 135 mmol/L (ref 135–145)

## 2023-08-02 LAB — CBC
HCT: 43 % (ref 39.0–52.0)
Hemoglobin: 14.6 g/dL (ref 13.0–17.0)
MCH: 31.9 pg (ref 26.0–34.0)
MCHC: 34 g/dL (ref 30.0–36.0)
MCV: 94.1 fL (ref 80.0–100.0)
Platelets: 211 10*3/uL (ref 150–400)
RBC: 4.57 MIL/uL (ref 4.22–5.81)
RDW: 12.6 % (ref 11.5–15.5)
WBC: 6.1 10*3/uL (ref 4.0–10.5)
nRBC: 0 % (ref 0.0–0.2)

## 2023-08-02 LAB — TROPONIN I (HIGH SENSITIVITY): Troponin I (High Sensitivity): 6 ng/L (ref <18)

## 2023-08-02 MED ORDER — ACETAMINOPHEN 500 MG PO TABS
1000.0000 mg | ORAL_TABLET | Freq: Once | ORAL | Status: AC
  Administered 2023-08-02: 1000 mg via ORAL
  Filled 2023-08-02: qty 2

## 2023-08-02 NOTE — Discharge Instructions (Signed)
You can take Tylenol at home for headache.  You may continue taking all of your medications as prescribed.  Please follow-up with your primary care doctor within the next 1 to 2 weeks to discuss your episode of chest pain.  Come back to the emergency department if you experience repeated chest pain, difficulty walking, or lose consciousness.

## 2023-08-02 NOTE — ED Triage Notes (Addendum)
The patient fell backwards and hit his head on concrete. He had a LOC. No blood thinner use. He is now having headache and dizziness. He also is having chest pain on and off. He took nitroglycerin last night and it helped.

## 2023-08-02 NOTE — ED Provider Notes (Signed)
[place] [facility] AT [facility] Provider Note   CSN: [id number] Arrival date & time: 08/02/23  1107     History  Chief Complaint  Patient presents with   Fall   Head Injury   Dizziness   Chest Pain    [name] is a 72 y.o. male.  72 year old male presents emergency department after he had a fall yesterday.  Patient was in his garage, tripped over an object and fell backwards into a wall and struck his head.  He says that he lost consciousness.  He says that last evening he also briefly experienced chest pain while laying down.  He has a history of angina and will sometimes take nitroglycerin.  Not currently experiencing any chest pain.   Fall Associated symptoms include chest pain.  Head Injury Dizziness Associated symptoms: chest pain   Chest Pain Associated symptoms: dizziness        Home Medications Prior to Admission medications   Medication Sig Start Date End Date Taking? Authorizing Provider  doxycycline (ADOXA) 100 MG tablet Take 1 tablet (100 mg total) by mouth 2 (two) times daily. 11/08/22   Hetty Blend, FNP  albuterol (VENTOLIN HFA) 108 (90 Base) MCG/ACT inhaler Inhale 2 puffs into the lungs every 4 (four) hours as needed for wheezing or shortness of breath. 11/21/22   Hetty Blend, FNP  arformoterol (BROVANA) 15 MCG/2ML NEBU Take 2 mLs (15 mcg total) by nebulization 2 (two) times daily. 07/03/22   Alfonse Spruce, MD  atorvastatin (LIPITOR) 20 MG tablet Take 20 mg by mouth daily. Patient not taking: Reported on 11/08/2022 03/30/21   [provider]  BROVANA 15 MCG/2ML NEBU Take 2 mLs (15 mcg total) by nebulization in the morning, at noon, and at bedtime. 11/12/21   Alfonse Spruce, MD  budesonide (PULMICORT) 0.5 MG/2ML nebulizer solution Take 2 mLs (0.5 mg total) by nebulization 2 (two) times daily. 11/08/22   Hetty Blend, FNP  cetirizine (ZYRTEC) 10 MG tablet Take 1 tablet (10 mg total) by mouth 2 (two) times  daily. 12/12/21   Alfonse Spruce, MD  diphenhydrAMINE (SOMINEX) 25 MG tablet Take 25 mg by mouth at bedtime as needed for itching.    [provider]  docusate sodium (COLACE) 100 MG capsule Take 1 capsule (100 mg total) by mouth every 12 (twelve) hours. 04/11/21   Pricilla Loveless, MD  EPINEPHrine (EPIPEN 2-PAK) 0.3 mg/0.3 mL IJ SOAJ injection Inject 0.3 mLs (0.3 mg total) into the muscle as needed for anaphylaxis. 07/03/19   Alfonse Spruce, MD  fluticasone [facility]) 50 MCG/ACT nasal spray 1-2 sprays per nostril daily as needed Patient taking differently: Place 1 spray into both nostrils daily as needed for allergies. 03/08/21   Alfonse Spruce, MD  ipratropium-albuterol (DUONEB) 0.5-2.5 (3) MG/3ML SOLN USE 1 VIAL IN NEBULIZER DAILY - For rescue 06/03/23   Hetty Blend, FNP  lactulose (CHRONULAC) 10 GM/15ML solution Take 30 mLs by mouth 3 (three) times daily as needed for mild constipation. 01/16/21   [provider]  levalbuterol Pauline Aus) 1.25 MG/3ML nebulizer solution Take 1.25 mg by nebulization every 4 (four) hours as needed for wheezing. 06/26/22   Alfonse Spruce, MD  Lidocaine 5 % CREA Apply to perianal area as needed for pain. Patient taking differently: Place 1 application  around the anus daily as needed (pain). 10/09/20   Molpus, John, MD  loratadine (CLARITIN) 10 MG tablet Take 1 tablet (10 mg total)  by mouth daily as needed for allergies or itching. 12/18/21   Nehemiah Settle, FNP  nitroGLYCERIN (NITROSTAT) 0.4 MG SL tablet Place 1 tablet (0.4 mg total) under the tongue every 5 (five) minutes as needed for chest pain. 06/21/15   Waldon Merl, PA-C  ondansetron (ZOFRAN ODT) 4 MG disintegrating tablet Take 1 tablet (4 mg total) by mouth every 8 (eight) hours as needed for nausea or vomiting. 07/06/20   Alfonse Spruce, MD  OXYGEN Inhale 3-4 L into the lungs at bedtime. DURING THE DAY IF NEEDED Reported on 11/25/2015    [provider]   polyethylene glycol (MIRALAX / GLYCOLAX) 17 g packet Take 17 g by mouth daily. 04/11/21   Pricilla Loveless, MD  predniSONE (DELTASONE) 10 MG tablet Begin prednisone 10 mg tablets. Take 2 tablets twice a day for 3 days, then take 2 tablets once a day for 1 day, then take 1 tablet on the 5th day, then stop 11/08/22   Ambs, Norvel Richards, FNP  predniSONE (DELTASONE) 5 MG tablet Take 1 tablet (5 mg total) by mouth daily with breakfast. 11/12/21   Alfonse Spruce, MD  Respiratory Therapy Supplies (NEBULIZER/TUBING/MOUTHPIECE) KIT 1 each by Does not apply route as directed. 07/24/19   Alfonse Spruce, MD  Sodium Phosphates (ENEMA RE) Place 1 enema rectally daily as needed (constipation).    [provider]      Allergies    Aspirin, Benralizumab, Hornet venom, Iodinated contrast media, Iodine, Levaquin [levofloxacin in d5w], Nsaids, Penicillins, Perforomist [formoterol], Tolmetin, Buprenorphine hcl, Morphine and codeine, and Oxycodone    Review of Systems   Review of Systems  Cardiovascular:  Positive for chest pain.  Neurological:  Positive for dizziness.    Physical Exam Updated Vital Signs BP (!) 124/51 (BP Location: Right Arm)   Pulse (!) 58   Temp 98.1 F (36.7 C) (Oral)   Resp 18   Ht 6' (1.829 m)   Wt 100.2 kg   SpO2 99%   BMI 29.97 kg/m  Physical Exam Vitals and nursing note reviewed.  HENT:     Head: Normocephalic and atraumatic.  Cardiovascular:     Rate and Rhythm: Normal rate and regular rhythm.  Pulmonary:     Effort: Pulmonary effort is normal.  Musculoskeletal:     Cervical back: Normal range of motion.  Neurological:     General: No focal deficit present.     Cranial Nerves: No cranial nerve deficit.     Motor: No weakness.     Comments: Normal gait     ED Results / Procedures / Treatments   Labs (all labs ordered are listed, but only abnormal results are displayed) Labs Reviewed  BASIC METABOLIC PANEL - Abnormal; Notable for the following  components:      Result Value   Glucose, Bld 111 (*)    All other components within normal limits  CBC  TROPONIN I (HIGH SENSITIVITY)    EKG None  Radiology CT Head Wo Contrast  Result Date: 08/02/2023 CLINICAL DATA:  Ataxia, head trauma; Neck trauma, dangerous injury mechanism (Age 61-64y) this is EXAM: CT HEAD WITHOUT CONTRAST CT CERVICAL SPINE WITHOUT CONTRAST TECHNIQUE: Multidetector CT imaging of the head and cervical spine was performed following the standard protocol without intravenous contrast. Multiplanar CT image reconstructions of the cervical spine were also generated. RADIATION DOSE REDUCTION: This exam was performed according to the departmental dose-optimization program which includes automated exposure control, adjustment of the mA and/or kV according to  patient size and/or use of iterative reconstruction technique. COMPARISON:  None Available. FINDINGS: CT HEAD FINDINGS Brain: No hemorrhage. No hydrocephalus. No extra-axial fluid collection. No CT evidence of an acute cortical infarct. No mass effect. No mass lesion. Vascular: No hyperdense vessel or unexpected calcification. Skull: Normal. Negative for fracture or focal lesion. Sinuses/Orbits: No middle ear or mastoid effusion. Paranasal sinuses are clear. Orbits are unremarkable. Other: None. CT CERVICAL SPINE FINDINGS Alignment: Trace anterolisthesis of C4 on C5 Skull base and vertebrae: No acute fracture. No primary bone lesion or focal pathologic process. Soft tissues and spinal canal: No prevertebral fluid or swelling. No visible canal hematoma. Disc levels:  No evidence of high-grade spinal canal stenosis Upper chest: Negative. Other: None IMPRESSION: 1. No acute intracranial abnormality. 2. No acute fracture or traumatic subluxation of the cervical spine. Electronically Signed   By: Lorenza Cambridge M.D.   On: 08/02/2023 12:50   CT Cervical Spine Wo Contrast  Result Date: 08/02/2023 CLINICAL DATA:  Ataxia, head trauma; Neck  trauma, dangerous injury mechanism (Age 4-64y) this is EXAM: CT HEAD WITHOUT CONTRAST CT CERVICAL SPINE WITHOUT CONTRAST TECHNIQUE: Multidetector CT imaging of the head and cervical spine was performed following the standard protocol without intravenous contrast. Multiplanar CT image reconstructions of the cervical spine were also generated. RADIATION DOSE REDUCTION: This exam was performed according to the departmental dose-optimization program which includes automated exposure control, adjustment of the mA and/or kV according to patient size and/or use of iterative reconstruction technique. COMPARISON:  None Available. FINDINGS: CT HEAD FINDINGS Brain: No hemorrhage. No hydrocephalus. No extra-axial fluid collection. No CT evidence of an acute cortical infarct. No mass effect. No mass lesion. Vascular: No hyperdense vessel or unexpected calcification. Skull: Normal. Negative for fracture or focal lesion. Sinuses/Orbits: No middle ear or mastoid effusion. Paranasal sinuses are clear. Orbits are unremarkable. Other: None. CT CERVICAL SPINE FINDINGS Alignment: Trace anterolisthesis of C4 on C5 Skull base and vertebrae: No acute fracture. No primary bone lesion or focal pathologic process. Soft tissues and spinal canal: No prevertebral fluid or swelling. No visible canal hematoma. Disc levels:  No evidence of high-grade spinal canal stenosis Upper chest: Negative. Other: None IMPRESSION: 1. No acute intracranial abnormality. 2. No acute fracture or traumatic subluxation of the cervical spine. Electronically Signed   By: Lorenza Cambridge M.D.   On: 08/02/2023 12:50    Procedures Procedures    Medications Ordered in ED Medications  acetaminophen (TYLENOL) tablet 1,000 mg (1,000 mg Oral Given 08/02/23 1348)    ED Course/ Medical Decision Making/ A&P                                 Medical Decision Making 72 year old male presenting to the emergency department after a fall last evening.  Differential  diagnoses include concussion, closed head injury, less likely intracranial hemorrhage, cervical spine injury, whiplash injury, ACS, stable angina.  Plan-will obtain imaging of the patient's head and neck.  Patient with no neurological deficits, overall looks well.  No other traumatic injuries identified on exam.  My independent review of the patient's EKG shows no ST segment depressions or elevations, no T wave versions, no evidence of acute ischemia.  Troponin ordered.  Mind pending review the patient's head CT shows no intracranial hemorrhage.  My independent review of the patient's chest xray shows no pneumonia.  Patient's troponin not elevated.  With him having an episode of chest pain last  evening, if this did represent ACS, would expect there to be a rise.  Will not obtain delta.  Patient ambulatory without any difficulty.  Questions answered at bedside.  Will discharge home.  Amount and/or Complexity of Data Reviewed Labs: ordered. Radiology: ordered.  Risk OTC drugs.           Final Clinical Impression(s) / ED Diagnoses Final diagnoses:  Fall, initial encounter  Injury of head, initial encounter    Rx / DC Orders ED Discharge Orders     None         Arletha Pili, DO 08/02/23 1449

## 2023-08-05 NOTE — Plan of Care (Signed)
[facility] Tonsillectomy/Adenoidectomy, Postoperative PEDS care plan entered in error.

## 2023-08-07 NOTE — Telephone Encounter (Signed)
[facility] sent over forms for a CPAP and oxygen therapy. I called [facility] per Dr. Dellis Anes to inform them he will need his pulmonologist or PCP to sign and manage both forms of care.  For future reference Dr. Dellis Anes does not plan to manage care for either cpap machines or oxygen therapy. A referral was placed for pulmonology, but the patient has no showed several times and they refuse to make another appointment for him to be seen at their facility.   Patient is going to [facility] for a New patient appointment on 09/01/23. He no longer has a PCP. He is not sure why these orders were sent to our office he does not want either.     Phone: [phone]

## 2023-08-08 NOTE — Telephone Encounter (Signed)
Thanks for calling him

## 2023-08-13 ENCOUNTER — Encounter: Payer: Self-pay | Admitting: Allergy & Immunology

## 2023-08-13 ENCOUNTER — Other Ambulatory Visit: Payer: Self-pay

## 2023-08-13 ENCOUNTER — Ambulatory Visit (INDEPENDENT_AMBULATORY_CARE_PROVIDER_SITE_OTHER): Payer: Medicare Other | Admitting: Allergy & Immunology

## 2023-08-13 VITALS — BP 120/70 | HR 66 | Temp 98.6°F | Resp 16 | Ht 70.25 in | Wt 227.7 lb

## 2023-08-13 DIAGNOSIS — K219 Gastro-esophageal reflux disease without esophagitis: Secondary | ICD-10-CM

## 2023-08-13 DIAGNOSIS — R918 Other nonspecific abnormal finding of lung field: Secondary | ICD-10-CM | POA: Diagnosis not present

## 2023-08-13 DIAGNOSIS — J3089 Other allergic rhinitis: Secondary | ICD-10-CM | POA: Diagnosis not present

## 2023-08-13 DIAGNOSIS — J302 Other seasonal allergic rhinitis: Secondary | ICD-10-CM

## 2023-08-13 DIAGNOSIS — T63481D Toxic effect of venom of other arthropod, accidental (unintentional), subsequent encounter: Secondary | ICD-10-CM

## 2023-08-13 DIAGNOSIS — J4489 Other specified chronic obstructive pulmonary disease: Secondary | ICD-10-CM | POA: Diagnosis not present

## 2023-08-13 NOTE — Patient Instructions (Addendum)
1. Severe persistent asthma with COPD overlap - Lung testing looked stable today. - We will put in a referral to the requested Pulmonologist. - We are not going to make changes. - There are not really any alternative medications compared to what you are on right now. - Daily controller medication(s): Brovana (aformoterol) + Pulmicort (budesonide) 0.5 mg nebulizer treatment TWO TIMES DAILY + Yupelri once daily + montelukast 10 mg ONE TIME DAILY + prednisone 10mg  ONCE DAILY - Prior to physical activity: albuterol 2 puffs 10-15 minutes before physical activity. - Rescue medications: albuterol nebulizer one vial every 4-6 hours as needed or DuoNeb nebulizer one vial every 4-6 hours as needed - Asthma control goals:  * Full participation in all desired activities (may need albuterol before activity) * Albuterol use two time or less a week on average (not counting use with activity) * Cough interfering with sleep two time or less a month * Oral steroids no more  than once a year * No hospitalizations  2. Perennial and seasonal allergic rhinitis - Continue with loratadine 10mg  daily as needed. - Continue with fluticasone 1-2 sprays per nostril daily as needed.   3. GERD - Continue with omeprazole 40mg  twice daily.    4. Stinging insect anaphylaxis (honeybee, hornets, yellow jacket, wasp) - Continue to avoid insect stings. - EpiPen is up to date.   5. Health maintenance - The RSV is a one-time vaccine, so definitely get that. - Flu and COVID vaccines change every year depending on the strain that circulating. - Pre-medicate with two Allegra 30 minutes before you come in.  6. Return in about 6 months (around 02/11/2024).    Please inform us of any Emergency Department visits, hospitalizations, or changes in symptoms. Call us before going to the ED for breathing or allergy symptoms since we might be able to fit you in for a sick visit. Feel free to contact us anytime with any questions,  problems, or concerns.  It was a pleasure to see you again today! Tell the missus I said hello!   Websites that have reliable patient information: 1. American Academy of Asthma, Allergy, and Immunology: www.aaaai.org 2. Food Allergy Research and Education (FARE): foodallergy.org 3. Mothers of Asthmatics: http://www.asthmacommunitynetwork.org 4. American College of Allergy, Asthma, and Immunology: www.acaai.org   COVID-19 Vaccine Information can be found at: PodExchange.nl For questions related to vaccine distribution or appointments, please email [email or web address] or call [phone].     "Like" [name] on Facebook and Instagram for our latest updates!       Make sure you are registered to vote! If you have moved or changed any of your contact information, you will need to get this updated before voting!  In some cases, you MAY be able to register to vote online: [email or web address]

## 2023-08-13 NOTE — Progress Notes (Signed)
FOLLOW UP  Date of Service/Encounter:  08/13/23   Assessment:   Severe persistent asthma with COPD overlap - was doing well on Tezspire, but patient is requesting to stop it    Systemic reaction versus panic attack to [name] and [name]    Chronic prednisone use (15+ years) - currently on 10mg  daily (with intermittent self-induced bursts up to 20mg  daily), obtaining DEXA scan today   Seasonal and perennial allergic rhinitis (grasses, weeds, trees, cat, dog, dust mite, and cockroach)   History of non-compliance   Pulmonary nodules (stable per last chest CT August 2020)   Complex medical history, including bipolar depression   GERD - on omeprazole   OSA - with history of non-compliance with CPAP    Stinging insect anaphylaxis - interested in starting venom immunotherapy, although his poor follow up, poor compliance, and poor lung function make this unlikely to happen for now  Plan/Recommendations:   1. Severe persistent asthma with COPD overlap - Lung testing looked stable today. - We will put in a referral to the requested Pulmonologist. - We are not going to make changes. - There are not really any alternative medications compared to what you are on right now. - Daily controller medication(s): Brovana (aformoterol) + Pulmicort (budesonide) 0.5 mg nebulizer treatment TWO TIMES DAILY + Yupelri once daily + montelukast 10 mg ONE TIME DAILY + prednisone 10mg  ONCE DAILY - Prior to physical activity: albuterol 2 puffs 10-15 minutes before physical activity. - Rescue medications: albuterol nebulizer one vial every 4-6 hours as needed or DuoNeb nebulizer one vial every 4-6 hours as needed - Asthma control goals:  * Full participation in all desired activities (may need albuterol before activity) * Albuterol use two time or less a week on average (not counting use with activity) * Cough interfering with sleep two time or less a month * Oral steroids no more  than once a  year * No hospitalizations  2. Perennial and seasonal allergic rhinitis - Continue with loratadine 10mg  daily as needed. - Continue with fluticasone 1-2 sprays per nostril daily as needed.   3. GERD - Continue with omeprazole 40mg  twice daily.    4. Stinging insect anaphylaxis (honeybee, hornets, yellow jacket, wasp) - Continue to avoid insect stings. - EpiPen is up to date.   5. Health maintenance - The RSV is a one-time vaccine, so definitely get that. - Flu and COVID vaccines change every year depending on the strain that circulating. - Pre-medicate with two Allegra 30 minutes before you come in.  6. Return in about 6 months (around 02/11/2024).   Subjective:   [name] is a 72 y.o. male presenting today for follow up of  Chief Complaint  Patient presents with   Follow-up   asthma-copd    Requesting a referral to pulmonology at [facility].     [name] has a history of the following: Patient Active Problem List   Diagnosis Date Noted   Viral illness 11/08/2022   Subacute cough 11/08/2022   Insect sting allergy, current reaction, accidental or unintentional, subsequent encounter 11/08/2022   Acute exacerbation of chronic obstructive pulmonary disease (COPD) (HCC) 04/11/2021   Pneumonia due to COVID-19 virus 07/27/2020   COPD with acute exacerbation (HCC) 12/04/2018   PSVT (paroxysmal supraventricular tachycardia) (HCC) 09/23/2018   Seasonal and perennial allergic rhinitis 07/30/2018   Asthma-COPD overlap syndrome (HCC) 07/07/2018   S/P arthroscopy of right shoulder 04/25/2017   Generalized anxiety disorder 04/22/2017  Severe persistent asthma without complication 02/12/2017   Bee sting-induced anaphylaxis 02/12/2017   Anaphylactic reaction due to food, initial encounter 02/12/2017   Current use of beta blocker 02/12/2017   Allergic rhinitis 01/15/2017   COPD exacerbation (HCC) 11/04/2016   Severe persistent asthma with acute bronchitis and  acute exacerbation 11/03/2016   Nausea without vomiting 09/17/2016   Shortness of breath    Type 2 diabetes mellitus with obesity (HCC) 06/26/2016   non-specific Chest pain 06/12/2016   Patient's noncompliance with other medical treatment and regimen 06/02/2016   Hyperlipidemia, mixed 06/02/2016   Elevated liver enzymes  06/02/2016   Schizophrenia (HCC) 04/30/2016   Environmental and seasonal allergies 04/30/2016   Diabetes mellitus without complication- (diet controlled; w/o proteinuria) 04/30/2016   Heavy alcohol use- Many yrs  04/30/2016   Basal cell carcinoma of back 04/30/2016   Obesity 04/30/2016   Hx of multiple concussions 04/30/2016   h/o Vitamin D deficiency 04/30/2016   Healthcare maintenance 04/30/2016   Immunocompromised state (HCC) 04/30/2016   Cervical radiculopathy 01/15/2016   Alcohol withdrawal (HCC) 11/22/2015   Alcohol abuse with alcohol-induced mood disorder (HCC) 11/22/2015   h/o Suicidal thoughts 11/21/2015   Dry mouth 10/07/2015   Anxiety 09/19/2015   OAB (overactive bladder) 07/27/2015   Chronic idiopathic constipation 05/08/2015   Screening for colon cancer    Benign neoplasm of cecum    Benign neoplasm of ascending colon    Benign neoplasm of transverse colon    Benign neoplasm of sigmoid colon    Rectal polyp    Gastroesophageal reflux disease without esophagitis    Esophageal stricture    Joint stiffness of multiple sites 10/17/2014   Benign localized hyperplasia of prostate with urinary obstruction 09/29/2014   concerns for memory loss 09/13/2014   Tuberculosis    Diverticulum of bladder 06/17/2014   Acute urinary retention 05/31/2014   Constipation 04/29/2014   Neuropathy (HCC) 04/11/2014   Post-polio syndrome 01/13/2014   Other emphysema (HCC) 01/13/2014   Hypertension 01/13/2014   Former smoker 01/13/2014   h/o Prostate cancer 01/13/2014   History of tuberculosis 01/13/2014   Bipolar disorder (HCC) 01/13/2014   Explosive personality  disorder (HCC) 01/13/2014   Erectile dysfunction 01/13/2014   Nephrolithiasis 01/13/2014   Chronic pain 01/13/2014   Obstructive sleep apnea 01/13/2014   Alcoholic gastritis 08/14/2013   Nocturnal hypoxia 08/10/2013    History obtained from: chart review and patient.  Discussed the use of AI scribe software for clinical note transcription with the patient and/or guardian, who gave verbal consent to proceed.  [name] is a 72 y.o. male presenting for a follow up visit.  He was last seen in January 2024.  At that time, he was started on a prednisone burst for current flare.  He got a chest x-ray which was normal.  We continued him on Brovana and Pulmicort 2 times daily as well as Yupelri once daily and prednisone 5 mg.  For his rhinitis, we continue with Claritin and Flonase.  For his GERD, we started Protonix 40 mg daily.  He continue to avoid stinging insects.  EpiPen was up-to-date.  He continued to talk about doing an RSV vaccine challenge which she has yet to show up for.  Since last visit, he has been surprisingly stable.  However, he has had a number of concussions due to several falls from his ladder. The patient reported a brief loss of consciousness, but denied any subsequent neurological symptoms. No head CT was performed.  He has  been doing a lot of work around his house to pick some the house to make it livable.  They inherited this house from his mother-in-law. The patient is also dealing with the financial implications of maintaining a house inherited by his spouse.   In addition to the concussions, the patient has been diagnosed with diabetes affecting the eyes and is scheduled to see a specialist. He also reports needing knee and shoulder replacements.   The patient has been managing his respiratory issues with Brovana and Pulmicort twice daily.  He also remains on prednisone 5 mg to 10 mg daily.  He was on this before even started seeing him.  We have tried a number of times to put  him on a biologic to help him get off the prednisone, but he has reactions to everyone that we try (I am not convinced that they are allergic in nature, but nonetheless he is not interested in trying any more).  This combination seems to be working well as he is avoided hospitalizations and emergency room visits for his breathing.  The patient is on tier four of his medication and is hesitant to move to tier five due to cost. He is considering getting both the COVID and RSV vaccines, but is unsure about the necessity of both.   The patient has been receiving unnecessary oxygen deliveries, despite not having used oxygen for over a year. He carries small oxygen tanks in his car for emergencies.  He has called [name] a few times for refills of the oxygen and then at 1 point yelled at [name] for prescribing the oxygen concentrator because it was too expensive.  We have told him every time that we have never managed his oxygen.  I did give him a 28-month prescription several years ago while he was between pulmonologist, but otherwise we have not been doing anything with his oxygen. He is considering getting an oxygen concentrator through Medicare and is seeking a referral to a pulmonologist in [facility].  He has a particular person in mind [facility].  He has seen several pulmonologist at [facility] as well as [facility], but he is fired all of them, mostly because they keep pushing a CPAP machine.  His rhinitis symptoms are under good control with his antihistamine and no spray.  He remains on the omeprazole 40 mg twice daily.  He has not had any insect stings and keeps his EpiPen up-to-date.  Otherwise, there have been no changes to his past medical history, surgical history, family history, or social history.    Review of systems otherwise negative other than that mentioned in the HPI.    Objective:   Blood pressure 120/70, pulse 66, temperature 98.6 F (37 C), temperature  source Temporal, resp. rate 16, height 5' 10.25" (1.784 m), weight 227 lb 11.2 oz (103.3 kg), SpO2 95%. Body mass index is 32.44 kg/m.    Physical Exam Vitals reviewed.  Constitutional:      Appearance: He is well-developed.     Comments: In a fairly good mood. Boisterous.   HENT:     Head: Normocephalic and atraumatic.     Right Ear: Tympanic membrane, ear canal and external ear normal.     Left Ear: Tympanic membrane, ear canal and external ear normal.     Nose: No nasal deformity, septal deviation, mucosal edema or rhinorrhea.     Right Turbinates: Enlarged, swollen and pale.     Left Turbinates: Enlarged, swollen and  pale.     Right Sinus: No maxillary sinus tenderness or frontal sinus tenderness.     Left Sinus: No maxillary sinus tenderness or frontal sinus tenderness.     Comments: No polyps noted.     Mouth/Throat:     Mouth: Mucous membranes are not pale and not dry.     Pharynx: Uvula midline.     Comments: Cobblestoning present. Eyes:     General: Lids are normal. Allergic shiner present.        Right eye: No discharge.        Left eye: No discharge.     Conjunctiva/sclera: Conjunctivae normal.     Right eye: Right conjunctiva is not injected. No chemosis.    Left eye: Left conjunctiva is not injected. No chemosis.    Pupils: Pupils are equal, round, and reactive to light.  Cardiovascular:     Rate and Rhythm: Normal rate and regular rhythm.     Heart sounds: Normal heart sounds.  Pulmonary:     Effort: Pulmonary effort is normal. No tachypnea, accessory muscle usage or respiratory distress.     Breath sounds: Examination of the right-upper field reveals wheezing. Examination of the left-upper field reveals wheezing. Examination of the right-middle field reveals wheezing. Examination of the left-middle field reveals wheezing. Wheezing present. No rhonchi or rales.     Comments: Diffuse expiratory wheezes.  This seems to be his baseline.  Talking in full sentences,  very loudly at times.  Chest:     Chest wall: No tenderness.  Lymphadenopathy:     Cervical: No cervical adenopathy.  Skin:    Coloration: Skin is not pale.     Findings: No abrasion, erythema, petechiae or rash. Rash is not papular, urticarial or vesicular.  Neurological:     Mental Status: He is alert.  Psychiatric:        Behavior: Behavior is cooperative.      Diagnostic studies:    Spirometry: results abnormal (FEV1: 1.88/61%, FVC: 2.84/69%, FEV1/FVC: 66%).    Spirometry consistent with possible restrictive disease.    Allergy Studies: none        Malachi Bonds, MD  [facility]

## 2023-08-14 MED ORDER — ARFORMOTEROL TARTRATE 15 MCG/2ML IN NEBU: 15.0000 ug | INHALATION_SOLUTION | Freq: Two times a day (BID) | RESPIRATORY_TRACT | 5 refills | Status: DC

## 2023-08-14 MED ORDER — ALBUTEROL SULFATE HFA 108 (90 BASE) MCG/ACT IN AERS: 2.0000 | INHALATION_SPRAY | RESPIRATORY_TRACT | 1 refills | Status: AC | PRN

## 2023-08-14 MED ORDER — BUDESONIDE 0.5 MG/2ML IN SUSP: 0.5000 mg | Freq: Two times a day (BID) | RESPIRATORY_TRACT | 5 refills | Status: AC

## 2023-08-14 MED ORDER — PREDNISONE 10 MG PO TABS: ORAL_TABLET | ORAL | 1 refills | Status: AC

## 2023-08-14 MED ORDER — YUPELRI 175 MCG/3ML IN SOLN: 175.0000 ug | Freq: Every day | RESPIRATORY_TRACT | 1 refills | Status: DC

## 2023-08-15 ENCOUNTER — Telehealth: Payer: Self-pay

## 2023-08-15 ENCOUNTER — Other Ambulatory Visit: Payer: Self-pay

## 2023-08-15 MED ORDER — YUPELRI 175 MCG/3ML IN SOLN: 175.0000 ug | Freq: Every day | RESPIRATORY_TRACT | 1 refills | Status: AC

## 2023-08-15 NOTE — Telephone Encounter (Signed)
Received on base communication from

## 2023-08-15 NOTE — Telephone Encounter (Signed)
From [facility] wanting diagnosis code on paients yupelri 136mcg/3ml. I called [phone] and spoke to Gillis Santa tried to give her verbal code for asthma,. She requested a fax instead. Sent to [phone]

## 2023-08-16 ENCOUNTER — Telehealth: Payer: Self-pay

## 2023-08-16 ENCOUNTER — Other Ambulatory Visit (HOSPITAL_COMMUNITY): Payer: Self-pay

## 2023-08-16 NOTE — Telephone Encounter (Signed)
Pharmacy Patient Advocate Encounter   Received notification from Patient Pharmacy that prior authorization for YUPELRI 175 MCG/3ML nebulizer solution  is required/requested.   Insurance verification completed.   The patient is insured through [employer] .   Per test claim:  No prior authorization is required.

## 2023-08-28 ENCOUNTER — Ambulatory Visit (HOSPITAL_BASED_OUTPATIENT_CLINIC_OR_DEPARTMENT_OTHER): Payer: Medicare Other | Admitting: Family Medicine

## 2023-10-07 ENCOUNTER — Encounter (HOSPITAL_BASED_OUTPATIENT_CLINIC_OR_DEPARTMENT_OTHER): Payer: Self-pay

## 2023-10-07 ENCOUNTER — Emergency Department (HOSPITAL_BASED_OUTPATIENT_CLINIC_OR_DEPARTMENT_OTHER): Payer: Medicare HMO

## 2023-10-07 ENCOUNTER — Emergency Department (HOSPITAL_BASED_OUTPATIENT_CLINIC_OR_DEPARTMENT_OTHER)
Admission: EM | Admit: 2023-10-07 | Discharge: 2023-10-07 | Disposition: A | Discharge status: Home / Self Care | Payer: Medicare HMO | Attending: Emergency Medicine | Admitting: Emergency Medicine

## 2023-10-07 ENCOUNTER — Other Ambulatory Visit: Payer: Self-pay

## 2023-10-07 DIAGNOSIS — W010XXA Fall on same level from slipping, tripping and stumbling without subsequent striking against object, initial encounter: Secondary | ICD-10-CM | POA: Insufficient documentation

## 2023-10-07 DIAGNOSIS — S5001XA Contusion of right elbow, initial encounter: Secondary | ICD-10-CM | POA: Diagnosis not present

## 2023-10-07 DIAGNOSIS — J449 Chronic obstructive pulmonary disease, unspecified: Secondary | ICD-10-CM | POA: Diagnosis not present

## 2023-10-07 DIAGNOSIS — S4991XA Unspecified injury of right shoulder and upper arm, initial encounter: Secondary | ICD-10-CM | POA: Diagnosis present

## 2023-10-07 DIAGNOSIS — Z8546 Personal history of malignant neoplasm of prostate: Secondary | ICD-10-CM | POA: Diagnosis not present

## 2023-10-07 DIAGNOSIS — Y92009 Unspecified place in unspecified non-institutional (private) residence as the place of occurrence of the external cause: Secondary | ICD-10-CM | POA: Diagnosis not present

## 2023-10-07 MED ORDER — ONDANSETRON 4 MG PO TBDP
4.0000 mg | ORAL_TABLET | Freq: Once | ORAL | Status: AC
  Administered 2023-10-07: 4 mg via ORAL
  Filled 2023-10-07: qty 1

## 2023-10-07 MED ORDER — ONDANSETRON 4 MG PO TBDP
4.0000 mg | ORAL_TABLET | Freq: Three times a day (TID) | ORAL | 0 refills | Status: DC | PRN
Start: 2023-10-07 — End: 2023-10-21

## 2023-10-07 MED ORDER — MORPHINE SULFATE (PF) 4 MG/ML IV SOLN
4.0000 mg | Freq: Once | INTRAVENOUS | Status: AC
  Administered 2023-10-07: 4 mg via INTRAMUSCULAR
  Filled 2023-10-07: qty 1

## 2023-10-07 MED ORDER — HYDROCODONE-ACETAMINOPHEN 5-325 MG PO TABS: 1.0000 | ORAL_TABLET | Freq: Four times a day (QID) | ORAL | 0 refills | Status: DC | PRN

## 2023-10-07 NOTE — ED Triage Notes (Addendum)
Pt states that he fell after he got tangled in some wires. Landed on the right arm. States that he heard the bone snap and that the elbow is not in the right place.

## 2023-10-07 NOTE — Discharge Instructions (Addendum)
As discussed, your imaging is reassuring.  There are no acute fractures or dislocations.  Take Tylenol every 6-8 hours as needed for pain.  You can take Vicodin every 6 hours as needed for breakthrough pain.  You can take Zofran every 8 hours as needed for nausea and vomiting.  Follow RICE therapy protocol to help with swelling.  Vicodin can cause drowsiness so do not drive or operate heavy machinery or take this medication.     I did provide information for local orthopedics to follow-up with for reevaluation of your symptoms.  I have also provided information for GI to follow-up with for constipation.  Get help right away if: Your skin over the bruise breaks and starts to bleed. You have very bad pain. You have numbness in your hand or fingers. Your hand or fingers turn a very light color (pale) or cold. You have swelling of your hand and fingers. You cannot move your fingers or wrist.

## 2023-10-07 NOTE — ED Provider Notes (Signed)
[facility] Provider Note   CSN: [id number] Arrival date & time: 10/07/23  1316     History  Chief Complaint  Patient presents with   Arm Injury    [name] is a 72 y.o. male with a history of prostate cancer, COPD, and post-polio syndrome who presents the ED today for after a fall.  Patient reports he tripped at home earlier today and fell landing on his right arm. He denies head injury or LOC.  Patient reports pain to the right shoulder and elbow since then.  He is able to move his elbow and shoulder but at causes worsening pain.  He has taken Tylenol with minimal relief.  No weakness or loss of strength.  Reports prior rotator cuff repair to the right shoulder.  No additional complaints or concerns at this time.    Home Medications Prior to Admission medications   Medication Sig Start Date End Date Taking? Authorizing Provider  HYDROcodone-acetaminophen (NORCO/VICODIN) 5-325 MG tablet Take 1 tablet by mouth every 6 (six) hours as needed for up to 3 days for severe pain (pain score 7-10). 10/07/23 10/10/23 Yes Maxwell Marion, PA-C  ondansetron (ZOFRAN-ODT) 4 MG disintegrating tablet Take 1 tablet (4 mg total) by mouth every 8 (eight) hours as needed for up to 14 days for nausea or vomiting. 10/07/23 10/21/23 Yes Maxwell Marion, PA-C  acetaminophen (TYLENOL) 500 MG tablet Take 500 mg by mouth every 4 (four) hours as needed. 05/06/20   [provider]  albuterol (VENTOLIN HFA) 108 (90 Base) MCG/ACT inhaler Inhale 2 puffs into the lungs every 4 (four) hours as needed for wheezing or shortness of breath. 08/14/23   Alfonse Spruce, MD  arformoterol (BROVANA) 15 MCG/2ML NEBU Take 2 mLs (15 mcg total) by nebulization 2 (two) times daily. 08/14/23   Alfonse Spruce, MD  atorvastatin (LIPITOR) 20 MG tablet Take 20 mg by mouth daily. 03/30/21   [provider]  baclofen (LIORESAL) 10 MG tablet Take 10 mg by mouth 2  (two) times daily. 04/09/22   [provider]  budesonide (PULMICORT) 0.5 MG/2ML nebulizer solution Take 2 mLs (0.5 mg total) by nebulization 2 (two) times daily. 08/14/23   Alfonse Spruce, MD  cetirizine (ZYRTEC) 10 MG tablet Take 1 tablet (10 mg total) by mouth 2 (two) times daily. 12/12/21   Alfonse Spruce, MD  colchicine 0.6 MG tablet Take 0.6 mg by mouth daily.    [provider]  EPINEPHrine (EPIPEN 2-PAK) 0.3 mg/0.3 mL IJ SOAJ injection Inject 0.3 mLs (0.3 mg total) into the muscle as needed for anaphylaxis. 07/03/19   Alfonse Spruce, MD  ipratropium-albuterol (DUONEB) 0.5-2.5 (3) MG/3ML SOLN USE 1 VIAL IN NEBULIZER DAILY - For rescue 06/03/23   Hetty Blend, FNP  levalbuterol Pauline Aus) 1.25 MG/3ML nebulizer solution Take 1.25 mg by nebulization every 4 (four) hours as needed for wheezing. 06/26/22   Alfonse Spruce, MD  loratadine (CLARITIN) 10 MG tablet Take 1 tablet (10 mg total) by mouth daily as needed for allergies or itching. 12/18/21   Nehemiah Settle, FNP  nitroGLYCERIN (NITROSTAT) 0.4 MG SL tablet Place 1 tablet (0.4 mg total) under the tongue every 5 (five) minutes as needed for chest pain. 06/21/15   Waldon Merl, PA-C  OXYGEN Inhale 3-4 L into the lungs at bedtime. DURING THE DAY IF NEEDED Reported on 11/25/2015    [provider]  predniSONE (DELTASONE) 10 MG tablet Take  10 mg daily. 08/14/23   Alfonse Spruce, MD  Respiratory Therapy Supplies (NEBULIZER/TUBING/MOUTHPIECE) KIT 1 each by Does not apply route as directed. 07/24/19   Alfonse Spruce, MD  revefenacin [facility]) 175 MCG/3ML nebulizer solution Take 3 mLs (175 mcg total) by nebulization daily. 08/15/23   Alfonse Spruce, MD  traMADol (ULTRAM-ER) 300 MG 24 hr tablet Take 300 mg by mouth daily as needed for pain.    [provider]      Allergies    Aspirin, Benralizumab, Hornet venom, Iodinated contrast media, Iodine, Levaquin [levofloxacin in  d5w], Nsaids, Penicillins, Perforomist [formoterol], Tolmetin, Buprenorphine hcl, Morphine and codeine, and Oxycodone    Review of Systems   Review of Systems  Musculoskeletal:  Positive for arthralgias.  All other systems reviewed and are negative.   Physical Exam Updated Vital Signs BP 125/71   Pulse 66   Temp 98 F (36.7 C)   Resp 18   Ht 6' (1.829 m)   Wt 104.3 kg   SpO2 99%   BMI 31.19 kg/m  Physical Exam Vitals and nursing note reviewed.  Constitutional:      General: He is not in acute distress.    Appearance: Normal appearance.  HENT:     Head: Normocephalic and atraumatic.     Mouth/Throat:     Mouth: Mucous membranes are moist.  Eyes:     Conjunctiva/sclera: Conjunctivae normal.     Pupils: Pupils are equal, round, and reactive to light.  Cardiovascular:     Rate and Rhythm: Normal rate and regular rhythm.     Pulses: Normal pulses.     Heart sounds: Normal heart sounds.  Pulmonary:     Effort: Pulmonary effort is normal.     Breath sounds: Normal breath sounds.  Abdominal:     Palpations: Abdomen is soft.     Tenderness: There is no abdominal tenderness.  Musculoskeletal:        General: Tenderness present. Normal range of motion.     Cervical back: Normal range of motion. No tenderness.     Comments: Tenderness to palpation of the anterior and posterior right shoulder without gross deformity or swelling.  He is able to move the shoulder despite pain.  Tenderness to palpation medial and lateral aspects of the to flex and extend the joint.   Palpable radial pulses bilaterally.  Grip strength intact bilaterally.  No new or worsening midline tenderness to palpation of the cervical, thoracic, or lumbar spine.  Skin:    General: Skin is warm and dry.     Findings: No rash.  Neurological:     General: No focal deficit present.     Mental Status: He is alert.     Sensory: No sensory deficit.     Motor: No weakness.  Psychiatric:        Mood and Affect:  Mood normal.        Behavior: Behavior normal.    ED Results / Procedures / Treatments   Labs (all labs ordered are listed, but only abnormal results are displayed) Labs Reviewed - No data to display  EKG None  Radiology DG Forearm Right Result Date: 10/07/2023 CLINICAL DATA:  Fall.  Right forearm injury and pain. EXAM: RIGHT FOREARM - 2 VIEW COMPARISON:  None Available. FINDINGS: There is no evidence of fracture or other focal bone lesions. Soft tissues are unremarkable. IMPRESSION: Negative. Electronically Signed   By: Danae Orleans M.D.   On: 10/07/2023 14:43  DG Humerus Right Result Date: 10/07/2023 CLINICAL DATA:  Fall.  Right upper arm injury and pain. EXAM: RIGHT HUMERUS - 2+ VIEW COMPARISON:  None Available. FINDINGS: There is no evidence of fracture or other focal bone lesions. Soft tissues are unremarkable. IMPRESSION: Negative. Electronically Signed   By: Danae Orleans M.D.   On: 10/07/2023 14:42   DG Elbow Complete Right Result Date: 10/07/2023 CLINICAL DATA:  Fall.  Right elbow injury and pain. EXAM: RIGHT ELBOW - COMPLETE 3+ VIEW COMPARISON:  None Available. FINDINGS: There is no evidence of acut E fracture or dislocation. Elbow joint effusion cannot definitely be excluded due to nonstandard positioning. Mild degenerative changes are seen involving the ulnar joint. Small osteophyte is seen arising from the lateral epicondyle of the distal humerus. Soft tissues are unremarkable. IMPRESSION: No acute fracture or dislocation. Mild degenerative spurring. Electronically Signed   By: Danae Orleans M.D.   On: 10/07/2023 14:42    Procedures Procedures: not indicated.   Medications Ordered in ED Medications  ondansetron (ZOFRAN-ODT) disintegrating tablet 4 mg (4 mg Oral Given 10/07/23 1437)  morphine (PF) 4 MG/ML injection 4 mg (4 mg Intramuscular Given 10/07/23 1439)    ED Course/ Medical Decision Making/ A&P                                 Medical Decision Making Amount  and/or Complexity of Data Reviewed Radiology: ordered.  Risk Prescription drug management.   This patient presents to the ED for concern of right arm pain after fall, this involves an extensive number of treatment options, and is a complaint that carries with it a high risk of complications and morbidity.   Differential diagnosis includes: fracture, dislocation, ligamentous injury, contusion, hematoma, etc.   Comorbidities  See HPI above   Additional History  Additional history obtained from prior records.   Imaging Studies  I ordered imaging studies including right forearm, humerus, and elbow I independently visualized and interpreted imaging which showed:  No acute fractures or dislocations. I agree with the radiologist interpretation   Problem List / ED Course / Critical Interventions / Medication Management  Right arm injury after fall I ordered medications including: Morphine for pain Zofran for nausea  Reevaluation of the patient after these medicines showed that the patient improved I have reviewed the patients home medicines and have made adjustments as needed   Social Determinants of Health  Physical activity   Test / Admission - Considered  Discussed findings with patient and wife at bedside.  All questions answered. He is hemodynamically stable and safe for discharge home. Return precautions provided.       Final Clinical Impression(s) / ED Diagnoses Final diagnoses:  Contusion of right elbow, initial encounter    Rx / DC Orders ED Discharge Orders          Ordered    ondansetron (ZOFRAN-ODT) 4 MG disintegrating tablet  Every 8 hours PRN        10/07/23 1518    HYDROcodone-acetaminophen (NORCO/VICODIN) 5-325 MG tablet  Every 6 hours PRN        10/07/23 1518              Maxwell Marion, PA-C 10/07/23 1552    Theresia Lo, [place] K, DO 10/08/23 [phone]

## 2023-10-08 ENCOUNTER — Telehealth (HOSPITAL_BASED_OUTPATIENT_CLINIC_OR_DEPARTMENT_OTHER): Payer: Self-pay | Admitting: Emergency Medicine

## 2023-10-08 MED ORDER — HYDROCODONE-ACETAMINOPHEN 5-325 MG PO TABS: 1.0000 | ORAL_TABLET | Freq: Four times a day (QID) | ORAL | 0 refills | Status: AC | PRN

## 2023-10-08 MED ORDER — ONDANSETRON 4 MG PO TBDP: 4.0000 mg | ORAL_TABLET | Freq: Three times a day (TID) | ORAL | 0 refills | Status: AC | PRN

## 2023-10-08 NOTE — Telephone Encounter (Signed)
Patient called and stated they were unable to pick up their prescriptions - pharmacy did not receive the prescription order. Requested meds to be re-routed to [facility] on [address]. Confirmed on PDMP that script was not picked up.

## 2023-11-08 ENCOUNTER — Telehealth: Payer: Self-pay

## 2023-11-08 DIAGNOSIS — J4489 Other specified chronic obstructive pulmonary disease: Secondary | ICD-10-CM

## 2023-11-08 MED ORDER — IPRATROPIUM-ALBUTEROL 0.5-2.5 (3) MG/3ML IN SOLN: 3.0000 mL | Freq: Two times a day (BID) | RESPIRATORY_TRACT | 5 refills | Status: DC

## 2023-11-08 NOTE — Telephone Encounter (Signed)
Patient called needing DuoNeb solution sent into [facility] on [address]. Medication has been sent and the patient has no other concerns

## 2023-11-28 ENCOUNTER — Other Ambulatory Visit: Payer: Self-pay

## 2023-12-06 ENCOUNTER — Telehealth: Payer: Self-pay

## 2023-12-06 NOTE — Telephone Encounter (Signed)
Patient called and states he needs all his records released to a facility called [id number]. He wants to see if they are able to help him with his asthma/COPD. Patient will stop by our office sometime next week to sign a release of records form.

## 2023-12-10 NOTE — Telephone Encounter (Signed)
 Fine with me

## 2023-12-12 ENCOUNTER — Ambulatory Visit: Payer: Medicare Other | Admitting: Allergy & Immunology

## 2023-12-16 ENCOUNTER — Other Ambulatory Visit: Payer: Self-pay

## 2023-12-16 ENCOUNTER — Emergency Department (HOSPITAL_COMMUNITY)

## 2023-12-16 ENCOUNTER — Encounter (HOSPITAL_COMMUNITY): Payer: Self-pay

## 2023-12-16 ENCOUNTER — Emergency Department (HOSPITAL_COMMUNITY)
Admission: EM | Admit: 2023-12-16 | Discharge: 2023-12-17 | Discharge status: Against Medical Advice | Attending: Emergency Medicine | Admitting: Emergency Medicine

## 2023-12-16 DIAGNOSIS — Z5321 Procedure and treatment not carried out due to patient leaving prior to being seen by health care provider: Secondary | ICD-10-CM | POA: Insufficient documentation

## 2023-12-16 DIAGNOSIS — K5900 Constipation, unspecified: Secondary | ICD-10-CM | POA: Insufficient documentation

## 2023-12-16 LAB — CBC WITH DIFFERENTIAL/PLATELET
Abs Immature Granulocytes: 0.04 10*3/uL (ref 0.00–0.07)
Basophils Absolute: 0.1 10*3/uL (ref 0.0–0.1)
Basophils Relative: 1 %
Eosinophils Absolute: 0.4 10*3/uL (ref 0.0–0.5)
Eosinophils Relative: 5 %
HCT: 46.6 % (ref 39.0–52.0)
Hemoglobin: 15.2 g/dL (ref 13.0–17.0)
Immature Granulocytes: 1 %
Lymphocytes Relative: 21 %
Lymphs Abs: 1.7 10*3/uL (ref 0.7–4.0)
MCH: 31.5 pg (ref 26.0–34.0)
MCHC: 32.6 g/dL (ref 30.0–36.0)
MCV: 96.7 fL (ref 80.0–100.0)
Monocytes Absolute: 0.5 10*3/uL (ref 0.1–1.0)
Monocytes Relative: 6 %
Neutro Abs: 5.2 10*3/uL (ref 1.7–7.7)
Neutrophils Relative %: 66 %
Platelets: 238 10*3/uL (ref 150–400)
RBC: 4.82 MIL/uL (ref 4.22–5.81)
RDW: 13 % (ref 11.5–15.5)
WBC: 7.9 10*3/uL (ref 4.0–10.5)
nRBC: 0 % (ref 0.0–0.2)

## 2023-12-16 LAB — COMPREHENSIVE METABOLIC PANEL
ALT: 25 U/L (ref 0–44)
AST: 24 U/L (ref 15–41)
Albumin: 4.3 g/dL (ref 3.5–5.0)
Alkaline Phosphatase: 57 U/L (ref 38–126)
Anion gap: 6 (ref 5–15)
BUN: 17 mg/dL (ref 8–23)
CO2: 23 mmol/L (ref 22–32)
Calcium: 9 mg/dL (ref 8.9–10.3)
Chloride: 103 mmol/L (ref 98–111)
Creatinine, Ser: 1.15 mg/dL (ref 0.61–1.24)
GFR, Estimated: >60 mL/min (ref >60)
Glucose, Bld: 102 mg/dL — ABNORMAL HIGH (ref 70–99)
Potassium: 4.2 mmol/L (ref 3.5–5.1)
Sodium: 132 mmol/L — ABNORMAL LOW (ref 135–145)
Total Bilirubin: 1.1 mg/dL (ref 0.0–1.2)
Total Protein: 7.8 g/dL (ref 6.5–8.1)

## 2023-12-16 LAB — LIPASE, BLOOD: Lipase: 38 U/L (ref 11–51)

## 2023-12-16 NOTE — ED Triage Notes (Signed)
 Pt reports constipation x5 days. Pt reports h/x same. No relief with OTC meds.

## 2023-12-17 NOTE — ED Notes (Signed)
 PT LEFT AMA @ [phone]

## 2023-12-19 ENCOUNTER — Ambulatory Visit (INDEPENDENT_AMBULATORY_CARE_PROVIDER_SITE_OTHER): Payer: Medicare PPO | Admitting: Allergy & Immunology

## 2023-12-19 ENCOUNTER — Other Ambulatory Visit: Payer: Self-pay

## 2023-12-19 ENCOUNTER — Other Ambulatory Visit: Payer: Self-pay | Admitting: Allergy & Immunology

## 2023-12-19 VITALS — BP 120/70 | HR 70 | Temp 98.4°F | Ht 72.0 in | Wt 245.5 lb

## 2023-12-19 DIAGNOSIS — J3089 Other allergic rhinitis: Secondary | ICD-10-CM | POA: Diagnosis not present

## 2023-12-19 DIAGNOSIS — J302 Other seasonal allergic rhinitis: Secondary | ICD-10-CM

## 2023-12-19 DIAGNOSIS — K219 Gastro-esophageal reflux disease without esophagitis: Secondary | ICD-10-CM

## 2023-12-19 DIAGNOSIS — R918 Other nonspecific abnormal finding of lung field: Secondary | ICD-10-CM

## 2023-12-19 DIAGNOSIS — T63481D Toxic effect of venom of other arthropod, accidental (unintentional), subsequent encounter: Secondary | ICD-10-CM

## 2023-12-19 DIAGNOSIS — J4489 Other specified chronic obstructive pulmonary disease: Secondary | ICD-10-CM

## 2023-12-19 MED ORDER — ARFORMOTEROL TARTRATE 15 MCG/2ML IN NEBU: 15.0000 ug | INHALATION_SOLUTION | Freq: Two times a day (BID) | RESPIRATORY_TRACT | 0 refills | Status: DC

## 2023-12-19 MED ORDER — AIRSUPRA 90-80 MCG/ACT IN AERO: 2.0000 | INHALATION_SPRAY | RESPIRATORY_TRACT | 3 refills | Status: AC | PRN

## 2023-12-19 NOTE — Progress Notes (Signed)
 FOLLOW UP  Date of Service/Encounter:  12/19/23   Assessment:   Severe persistent asthma with COPD overlap - was doing well on Tezspire, but patient is requesting to stop it    Systemic reaction versus panic attack to [place] and Fasenra    Chronic prednisone use (15+ years) - currently on 10mg  daily (with intermittent self-induced bursts up to 20mg  daily)  Seasonal and perennial allergic rhinitis (grasses, weeds, trees, cat, dog, dust mite, and cockroach)   History of non-compliance   Pulmonary nodules (stable per last chest CT August 2020)   Complex medical history, including bipolar depression   GERD - on omeprazole   OSA - with history of non-compliance with CPAP    Stinging insect anaphylaxis - interested in starting venom immunotherapy, although his poor follow up, poor compliance, and poor lung function make this unlikely to happen for now  Plan/Recommendations:    1. Severe persistent asthma with COPD overlap - Lung testing actually looks much better today (especially your FVC which is an estimate of lung volume).  - I did send in some AirSupra to see if this is covered by your insurance.  - We are going to send you to see Dr. Gerome Apley with [facility] (this was placed in October 2024, but we are checking on this).  - I do not know anything about oxygen concentrators at all.  - We are adding another nebulized medication to see if this helps.  - Daily controller medication(s): Brovana (aformoterol) + Pulmicort (budesonide) 0.5 mg nebulizer treatment TWO TIMES DAILY + Yupelri once daily + montelukast 10 mg ONE TIME DAILY + prednisone 10mg  ONCE DAILY - We are adding on OHTUVAYRE (ensifentrine) Nebulizer solutions twice daily (this works with a different receptor).  - Prior to physical activity: albuterol 2 puffs 10-15 minutes before physical activity. - Rescue medications: albuterol nebulizer one vial every 4-6 hours as needed or DuoNeb nebulizer one vial  every 4-6 hours as needed - Asthma control goals:  * Full participation in all desired activities (may need albuterol before activity) * Albuterol use two time or less a week on average (not counting use with activity) * Cough interfering with sleep two time or less a month * Oral steroids no more  than once a year * No hospitalizations  2. Perennial and seasonal allergic rhinitis - Continue with loratadine 10mg  daily as needed. - Continue with fluticasone 1-2 sprays per nostril daily as needed.    3. GERD - Continue with omeprazole 40mg  twice daily.    4. Stinging insect anaphylaxis (honeybee, hornets, yellow jacket, wasp) - Continue to avoid insect stings. - EpiPen is up to date.  5. Health maintenance - Great job getting your RSV vaccine!  - I am so proud of you!   6. Return in about 6 months (around 06/20/2024). You can have the follow up appointment with Dr. Dellis Anes or a Nurse Practicioner (our Nurse Practitioners are excellent and always have Physician oversight!).   Subjective:   [name] is a 73 y.o. male presenting today for follow up of  Chief Complaint  Patient presents with   Follow-up   Asthma    [name] has a history of the following: Patient Active Problem List   Diagnosis Date Noted   Viral illness 11/08/2022   Subacute cough 11/08/2022   Insect sting allergy, current reaction, accidental or unintentional, subsequent encounter 11/08/2022   Acute exacerbation of chronic obstructive pulmonary disease (COPD) (HCC) 04/11/2021  Pneumonia due to COVID-19 virus 07/27/2020   COPD with acute exacerbation (HCC) 12/04/2018   PSVT (paroxysmal supraventricular tachycardia) (HCC) 09/23/2018   Seasonal and perennial allergic rhinitis 07/30/2018   Asthma-COPD overlap syndrome (HCC) 07/07/2018   S/P arthroscopy of right shoulder 04/25/2017   Generalized anxiety disorder 04/22/2017   Severe persistent asthma without complication 02/12/2017   Bee  sting-induced anaphylaxis 02/12/2017   Anaphylactic reaction due to food, initial encounter 02/12/2017   Current use of beta blocker 02/12/2017   Allergic rhinitis 01/15/2017   COPD exacerbation (HCC) 11/04/2016   Severe persistent asthma with acute bronchitis and acute exacerbation 11/03/2016   Nausea without vomiting 09/17/2016   Shortness of breath    Type 2 diabetes mellitus with obesity (HCC) 06/26/2016   non-specific Chest pain 06/12/2016   Patient's noncompliance with other medical treatment and regimen 06/02/2016   Hyperlipidemia, mixed 06/02/2016   Elevated liver enzymes  06/02/2016   Schizophrenia (HCC) 04/30/2016   Environmental and seasonal allergies 04/30/2016   Diabetes mellitus without complication- (diet controlled; w/o proteinuria) 04/30/2016   Heavy alcohol use- Many yrs  04/30/2016   Basal cell carcinoma of back 04/30/2016   Obesity 04/30/2016   Hx of multiple concussions 04/30/2016   h/o Vitamin D deficiency 04/30/2016   Healthcare maintenance 04/30/2016   Immunocompromised state (HCC) 04/30/2016   Cervical radiculopathy 01/15/2016   Alcohol withdrawal (HCC) 11/22/2015   Alcohol abuse with alcohol-induced mood disorder (HCC) 11/22/2015   h/o Suicidal thoughts 11/21/2015   Dry mouth 10/07/2015   Anxiety 09/19/2015   OAB (overactive bladder) 07/27/2015   Chronic idiopathic constipation 05/08/2015   Screening for colon cancer    Benign neoplasm of cecum    Benign neoplasm of ascending colon    Benign neoplasm of transverse colon    Benign neoplasm of sigmoid colon    Rectal polyp    Gastroesophageal reflux disease without esophagitis    Esophageal stricture    Joint stiffness of multiple sites 10/17/2014   Benign localized hyperplasia of prostate with urinary obstruction 09/29/2014   concerns for memory loss 09/13/2014   Tuberculosis    Diverticulum of bladder 06/17/2014   Acute urinary retention 05/31/2014   Constipation 04/29/2014   Neuropathy (HCC)  04/11/2014   Post-polio syndrome 01/13/2014   Other emphysema (HCC) 01/13/2014   Hypertension 01/13/2014   Former smoker 01/13/2014   h/o Prostate cancer 01/13/2014   History of tuberculosis 01/13/2014   Bipolar disorder (HCC) 01/13/2014   Explosive personality disorder (HCC) 01/13/2014   Erectile dysfunction 01/13/2014   Nephrolithiasis 01/13/2014   Chronic pain 01/13/2014   Obstructive sleep apnea 01/13/2014   Alcoholic gastritis 08/14/2013   Nocturnal hypoxia 08/10/2013    History obtained from: chart review and patient.  Discussed the use of AI scribe software for clinical note transcription with the patient and/or guardian, who gave verbal consent to proceed.  Aldric is a 73 y.o. male presenting for a follow up visit.  He was last seen in October 2024.  At that time, his lung testing looks stable.  We did not make any changes.  We continue with Brovana and Pulmicort twice daily as well as Yupelri daily.  He also remained on montelukast and 10 mg prednisone.  For his allergic rhinitis, he continue with loratadine and Flonase.  He also remained on omeprazole for GERD.  He continue to avoid stinging insects.  Since last visit, he has mostly done well.   Asthma/Respiratory Symptom History: He experiences breathing difficulties, particularly at night, which  often wake him up. He wakes up about five nights a week due to these issues and performs a treatment around 2 or 3 AM, after which he remains awake for the rest of the night. He has chest pains and difficulty breathing, which led to a recent emergency room visit where he stayed for about four hours. He is currently using a regimen of medications including budesonide, albuterol, and aformoterol, taking them multiple times a day: in the morning, at lunch, and after dinner. He also uses albuterol at night if needed and mentions using Yuperli at night but other times using regular albuterol nebulizer treatments. He is requesting brand name  Rosalyn Gess today since he thinks that this works better. He has been on a few biologics, but always has some perceived reactions to them (even days afterwards). He remains on the prednisone 10mg  daily. He has been on this for years - even well before I started taking care of him. We have tried to wean and have tried using biologics as a bridge to help him get off of the prednisone; this has been less than successful.   He has a history of emphysema, discovered during a hospital visit. He expresses dissatisfaction with a previous pulmonology appointment where he was not informed about his emphysema diagnosis. He has not been able to follow up with a pulmonologist as planned due to scheduling issues and lack of communication from the referred specialist. He is not interested in going back to [facility] at all.   He discusses his oxygen use, stating that he has a few small oxygen bottles and one tall one that have been unused since the COVID-19 pandemic. He is unsure if they are still full and mentions that [employer] is supposed to check his concentrator. He wants a smaller, more portable oxygen concentrator as he finds the current equipment cumbersome. He wants a smaller oxygen concentrator. I recommended that he talk to Pulmonology about this. We did put in a referral at the last visit, but he has not heard anything back from [facility] about this.   He mentions a change in pharmacy from [facility] to [facility] for convenience. He has received his RSV and COVID vaccines and plans to get his flu shot soon. He also received a shingles vaccine but was unaware that a second dose was required. He is fine with getting the second one.   Allergic Rhinitis Symptom History: He remains on the loratadine and Flonase daily. This is working well to control his allergic rhinitis. He has not been on antibiotics at all for his symptoms.   GERD Symptom History: He remains on the omeprazole which seems to be doing the  trick.   Otherwise, there have been no changes to his past medical history, surgical history, family history, or social history.    Review of systems otherwise negative other than that mentioned in the HPI.    Objective:   Blood pressure 120/70, pulse 70, temperature 98.4 F (36.9 C), height 6' (1.829 m), weight 245 lb 8 oz (111.4 kg), SpO2 97%. Body mass index is 33.3 kg/m.    Physical Exam Vitals reviewed.  Constitutional:      Appearance: He is well-developed.     Comments: In a fairly good mood. Boisterous.   HENT:     Head: Normocephalic and atraumatic.     Right Ear: Tympanic membrane, ear canal and external ear normal.     Left Ear: Tympanic membrane, ear canal and external ear normal.  Nose: No nasal deformity, septal deviation, mucosal edema or rhinorrhea.     Right Turbinates: Enlarged, swollen and pale.     Left Turbinates: Enlarged, swollen and pale.     Right Sinus: No maxillary sinus tenderness or frontal sinus tenderness.     Left Sinus: No maxillary sinus tenderness or frontal sinus tenderness.     Comments: No polyps noted.     Mouth/Throat:     Mouth: Mucous membranes are not pale and not dry.     Pharynx: Uvula midline.     Comments: Cobblestoning present. Eyes:     General: Lids are normal. Allergic shiner present.        Right eye: No discharge.        Left eye: No discharge.     Conjunctiva/sclera: Conjunctivae normal.     Right eye: Right conjunctiva is not injected. No chemosis.    Left eye: Left conjunctiva is not injected. No chemosis.    Pupils: Pupils are equal, round, and reactive to light.  Cardiovascular:     Rate and Rhythm: Normal rate and regular rhythm.     Heart sounds: Normal heart sounds.  Pulmonary:     Effort: Pulmonary effort is normal. No tachypnea, accessory muscle usage or respiratory distress.     Breath sounds: Wheezing present. No rhonchi or rales.     Comments: Faint wheezes at the bases. He does have some coarse  upper airway noises. Otherwise he is doing fairly well.  Chest:     Chest wall: No tenderness.  Lymphadenopathy:     Cervical: No cervical adenopathy.  Skin:    Coloration: Skin is not pale.     Findings: No abrasion, erythema, petechiae or rash. Rash is not papular, urticarial or vesicular.  Neurological:     Mental Status: He is alert.  Psychiatric:        Behavior: Behavior is cooperative.      Diagnostic studies:    Spirometry: results abnormal (FEV1: 1.71/56%, FVC: 4.08/100%, FEV1/FVC: 423%).    Spirometry consistent with mild obstructive disease. This is fairly good compared to previous spirometric findings.   Allergy Studies: none       Malachi Bonds, MD  [facility]

## 2023-12-19 NOTE — Telephone Encounter (Signed)
Can we submit a PA?

## 2023-12-19 NOTE — Patient Instructions (Addendum)
 1. Severe persistent asthma with COPD overlap - Lung testing actually looks much better today (especially your FVC which is an estimate of lung volume).  - I did send in some AirSupra to see if this is covered by your insurance.  - We are going to send you to see Dr. Gerome Apley with [facility] (this was placed in October 2024, but we are checking on this).  - I do not know anything about oxygen concentrators at all.  - We are adding another nebulized medication to see if this helps.  - Daily controller medication(s): Brovana (aformoterol) + Pulmicort (budesonide) 0.5 mg nebulizer treatment TWO TIMES DAILY + Yupelri once daily + montelukast 10 mg ONE TIME DAILY + prednisone 10mg  ONCE DAILY - We are adding on OHTUVAYRE (ensifentrine) Nebulizer solutions twice daily (this works with a different receptor).  - Prior to physical activity: albuterol 2 puffs 10-15 minutes before physical activity. - Rescue medications: albuterol nebulizer one vial every 4-6 hours as needed or DuoNeb nebulizer one vial every 4-6 hours as needed - Asthma control goals:  * Full participation in all desired activities (may need albuterol before activity) * Albuterol use two time or less a week on average (not counting use with activity) * Cough interfering with sleep two time or less a month * Oral steroids no more  than once a year * No hospitalizations  2. Perennial and seasonal allergic rhinitis - Continue with loratadine 10mg  daily as needed. - Continue with fluticasone 1-2 sprays per nostril daily as needed.    3. GERD - Continue with omeprazole 40mg  twice daily.    4. Stinging insect anaphylaxis (honeybee, hornets, yellow jacket, wasp) - Continue to avoid insect stings. - EpiPen is up to date.  5. Health maintenance - Great job getting your RSV vaccine!  - I am so proud of you!   6. Return in about 6 months (around 06/20/2024). You can have the follow up appointment with Dr. Dellis Anes or a Nurse  Practicioner (our Nurse Practitioners are excellent and always have Physician oversight!).    Please inform us of any Emergency Department visits, hospitalizations, or changes in symptoms. Call us before going to the ED for breathing or allergy symptoms since we might be able to fit you in for a sick visit. Feel free to contact us anytime with any questions, problems, or concerns.  It was a pleasure to see you and your family again today!  Websites that have reliable patient information: 1. American Academy of Asthma, Allergy, and Immunology: www.aaaai.org 2. Food Allergy Research and Education (FARE): foodallergy.org 3. Mothers of Asthmatics: http://www.asthmacommunitynetwork.org 4. American College of Allergy, Asthma, and Immunology: www.acaai.org      "Like" [name] on Facebook and Instagram for our latest updates!      A healthy democracy works best when [employer] participate! Make sure you are registered to vote! If you have moved or changed any of your contact information, you will need to get this updated before voting! Scan the QR codes below to learn more!

## 2023-12-23 ENCOUNTER — Telehealth: Payer: Self-pay

## 2023-12-23 ENCOUNTER — Encounter: Payer: Self-pay | Admitting: Allergy & Immunology

## 2023-12-23 ENCOUNTER — Other Ambulatory Visit (HOSPITAL_COMMUNITY): Payer: Self-pay

## 2023-12-23 NOTE — Telephone Encounter (Signed)
-----   Message from Alfonse Spruce sent at 12/23/2023 11:47 AM EDT ----- Elvina Sidle there! Can someone send in [facility] (ensifentrine) one treatment twice daily to wherever it needs to go? I do not recall how to order it. He probably needs his other nebulized medications refilled as well, unsure if they can all go to the same place or not?

## 2023-12-23 NOTE — Telephone Encounter (Signed)
 Called patient - DOB verified - advised of provider notation below.   Patient advised he will need to come by the office to sign the patient consent form before it can be   Patient stated he will come in, tomorrow, Tuesday - 12/24/23 @ 1:30 pm to sign the patient consent form.

## 2023-12-23 NOTE — Telephone Encounter (Signed)
 Per test claim no PA required at this time. Co-pay of $12.66. Insurance states that Part B Medications are excluded

## 2023-12-24 MED ORDER — ARFORMOTEROL TARTRATE 15 MCG/2ML IN NEBU: 15.0000 ug | INHALATION_SOLUTION | Freq: Two times a day (BID) | RESPIRATORY_TRACT | 0 refills | Status: AC

## 2023-12-24 NOTE — Addendum Note (Signed)
 Addended by: Maryjean Morn D on: 12/24/2023 04:46 PM   Modules accepted: Orders

## 2023-12-26 ENCOUNTER — Telehealth: Payer: Self-pay | Admitting: Allergy & Immunology

## 2023-12-26 MED ORDER — OHTUVAYRE 3 MG/2.5ML IN SUSP: 3.0000 mg | Freq: Two times a day (BID) | RESPIRATORY_TRACT | 11 refills | Status: AC

## 2023-12-26 MED ORDER — OHTUVAYRE 3 MG/2.5ML IN SUSP
3.0000 mg | Freq: Two times a day (BID) | RESPIRATORY_TRACT | 11 refills | Status: DC
Start: 2023-12-26 — End: 2023-12-26

## 2023-12-26 MED ORDER — OHTUVAYRE 3 MG/2.5ML IN SUSP
3.0000 mg | Freq: Two times a day (BID) | RESPIRATORY_TRACT | 1 refills | Status: DC
Start: 2023-12-26 — End: 2023-12-26

## 2023-12-26 NOTE — Telephone Encounter (Signed)
 There were two old referrals.  I looked up [facility] and Dr. Tomasita Morrow is the only provider I see for Pulmonary.  I went ahead and referred him to Dr. Tomasita Morrow with [facility].  I have faxed the referral and all corresponding notes to their office.  They will reach out to the patient to schedule.  Voicemail was left for Harvie Heck explaining the referral.

## 2023-12-26 NOTE — Telephone Encounter (Signed)
 Completed Ohtuvayre prescription form has been faxed to [facility] @ [phone].  For questions: [phone].

## 2023-12-26 NOTE — Telephone Encounter (Signed)
Wrote script.  Marlyss Cissell, MD [facility]  

## 2023-12-26 NOTE — Addendum Note (Signed)
 Addended by: Alfonse Spruce on: 12/26/2023 09:10 AM   Modules accepted: Orders

## 2024-01-31 ENCOUNTER — Other Ambulatory Visit (HOSPITAL_COMMUNITY): Payer: Self-pay

## 2024-01-31 ENCOUNTER — Telehealth: Payer: Self-pay

## 2024-01-31 LAB — LAB REPORT - SCANNED: PSA, Total: 4.28

## 2024-01-31 NOTE — Telephone Encounter (Signed)
 Pharmacy Patient Advocate Encounter  Received notification from [facility] that Prior Authorization for Arformoterol  Tartrate 15MCG/2ML nebulizer solution  has been CANCELLED due to medication to be billed under Medicare Part B

## 2024-02-06 ENCOUNTER — Telehealth: Payer: Self-pay | Admitting: Gastroenterology

## 2024-02-06 ENCOUNTER — Emergency Department (HOSPITAL_COMMUNITY)
Admission: EM | Admit: 2024-02-06 | Discharge: 2024-02-06 | Disposition: A | Discharge status: Home / Self Care | Attending: Emergency Medicine | Admitting: Emergency Medicine

## 2024-02-06 ENCOUNTER — Other Ambulatory Visit: Payer: Self-pay

## 2024-02-06 ENCOUNTER — Emergency Department (HOSPITAL_COMMUNITY)

## 2024-02-06 DIAGNOSIS — I10 Essential (primary) hypertension: Secondary | ICD-10-CM | POA: Insufficient documentation

## 2024-02-06 DIAGNOSIS — Z8546 Personal history of malignant neoplasm of prostate: Secondary | ICD-10-CM | POA: Diagnosis not present

## 2024-02-06 DIAGNOSIS — E119 Type 2 diabetes mellitus without complications: Secondary | ICD-10-CM | POA: Diagnosis not present

## 2024-02-06 DIAGNOSIS — R911 Solitary pulmonary nodule: Secondary | ICD-10-CM

## 2024-02-06 DIAGNOSIS — E871 Hypo-osmolality and hyponatremia: Secondary | ICD-10-CM | POA: Diagnosis not present

## 2024-02-06 DIAGNOSIS — Z7951 Long term (current) use of inhaled steroids: Secondary | ICD-10-CM | POA: Insufficient documentation

## 2024-02-06 DIAGNOSIS — K5904 Chronic idiopathic constipation: Secondary | ICD-10-CM | POA: Insufficient documentation

## 2024-02-06 DIAGNOSIS — K5909 Other constipation: Secondary | ICD-10-CM

## 2024-02-06 DIAGNOSIS — R1310 Dysphagia, unspecified: Secondary | ICD-10-CM

## 2024-02-06 DIAGNOSIS — J449 Chronic obstructive pulmonary disease, unspecified: Secondary | ICD-10-CM | POA: Diagnosis not present

## 2024-02-06 DIAGNOSIS — R739 Hyperglycemia, unspecified: Secondary | ICD-10-CM

## 2024-02-06 LAB — COMPREHENSIVE METABOLIC PANEL WITH GFR
ALT: 28 U/L (ref 0–44)
AST: 28 U/L (ref 15–41)
Albumin: 4.1 g/dL (ref 3.5–5.0)
Alkaline Phosphatase: 56 U/L (ref 38–126)
Anion gap: 9 (ref 5–15)
BUN: 14 mg/dL (ref 8–23)
CO2: 21 mmol/L — ABNORMAL LOW (ref 22–32)
Calcium: 9.1 mg/dL (ref 8.9–10.3)
Chloride: 100 mmol/L (ref 98–111)
Creatinine, Ser: 0.94 mg/dL (ref 0.61–1.24)
GFR, Estimated: >60 mL/min (ref >60)
Glucose, Bld: 116 mg/dL — ABNORMAL HIGH (ref 70–99)
Potassium: 4.4 mmol/L (ref 3.5–5.1)
Sodium: 130 mmol/L — ABNORMAL LOW (ref 135–145)
Total Bilirubin: 0.7 mg/dL (ref 0.0–1.2)
Total Protein: 7.3 g/dL (ref 6.5–8.1)

## 2024-02-06 LAB — CBC WITH DIFFERENTIAL/PLATELET
Abs Immature Granulocytes: 0.04 10*3/uL (ref 0.00–0.07)
Basophils Absolute: 0.1 10*3/uL (ref 0.0–0.1)
Basophils Relative: 1 %
Eosinophils Absolute: 0.4 10*3/uL (ref 0.0–0.5)
Eosinophils Relative: 6 %
HCT: 45.1 % (ref 39.0–52.0)
Hemoglobin: 15.5 g/dL (ref 13.0–17.0)
Immature Granulocytes: 1 %
Lymphocytes Relative: 24 %
Lymphs Abs: 1.4 10*3/uL (ref 0.7–4.0)
MCH: 32.4 pg (ref 26.0–34.0)
MCHC: 34.4 g/dL (ref 30.0–36.0)
MCV: 94.2 fL (ref 80.0–100.0)
Monocytes Absolute: 0.5 10*3/uL (ref 0.1–1.0)
Monocytes Relative: 8 %
Neutro Abs: 3.6 10*3/uL (ref 1.7–7.7)
Neutrophils Relative %: 60 %
Platelets: 226 10*3/uL (ref 150–400)
RBC: 4.79 MIL/uL (ref 4.22–5.81)
RDW: 13.1 % (ref 11.5–15.5)
WBC: 5.9 10*3/uL (ref 4.0–10.5)
nRBC: 0 % (ref 0.0–0.2)

## 2024-02-06 MED ORDER — PANTOPRAZOLE SODIUM 40 MG PO TBEC: 40.0000 mg | DELAYED_RELEASE_TABLET | Freq: Every day | ORAL | 0 refills | Status: AC

## 2024-02-06 MED ORDER — ONDANSETRON HCL 4 MG/2ML IJ SOLN
4.0000 mg | Freq: Once | INTRAMUSCULAR | Status: AC
  Administered 2024-02-06: 4 mg via INTRAVENOUS
  Filled 2024-02-06: qty 2

## 2024-02-06 MED ORDER — PANTOPRAZOLE SODIUM 40 MG PO TBEC
40.0000 mg | DELAYED_RELEASE_TABLET | Freq: Once | ORAL | Status: AC
  Administered 2024-02-06: 40 mg via ORAL
  Filled 2024-02-06: qty 1

## 2024-02-06 NOTE — Discharge Instructions (Addendum)
 If at any point, your unable to swallow at all, then you need to return to the emergency department immediately.  However, as long as you are able to swallow, you need to follow-up with your gastroenterologist to do an endoscopy to see if your esophagus is narrowed.  Your CT scan showed there is a nodule in the left lung.  The radiologist recommends a follow-up CT scan in 3-6 months.  Additional scans may be needed depending on what happens between now and the follow-up scan.  Your primary care provider can order the follow-up CT scan in 3-6 months.  Please take polyethylene glycol (MiraLAX ) every day.  Adjust the dose to what ever is needed for you to have regular bowel movements.

## 2024-02-06 NOTE — ED Triage Notes (Signed)
 Pt BIB [facility] from home. Pt reports feeling difficulty swallowing for 3 weeks. Tonight pt ate cherry pie and feels like it got stuck. Pt lung sounds clear in all fields. Pt able to talk. Pt on 3L [place] at baseline. Hx COPD 156/84 93% RA 80HR

## 2024-02-06 NOTE — ED Provider Notes (Signed)
 [place] EMERGENCY DEPARTMENT AT [facility] Provider Note   CSN: [id number] Arrival date & time: 02/06/24  0403     History  Chief Complaint  Patient presents with   Dysphagia    [name] is a 73 y.o. male.  The history is provided by the patient.  He has 3 of hypertension, diabetes, COPD, GERD, prostate cancer, chronic constipation and comes in because he feels like some food is stuck in his esophagus.  He states that he ate 2 pieces of cherry pie and the second piece seems to get stuck.  He has been trying to get it down by drinking fluids.  Fluid is going down, but slower than normal.  He has not vomited.  He also endorses some lesser degree of swallowing problems over the last several weeks.  He also complains of chronic constipation.  He states he has not had a bowel movement in the last 5 days.  He has been using enemas frequently.  He has taken Linzess  in the past, but it makes him nauseous.   Home Medications Prior to Admission medications   Medication Sig Start Date End Date Taking? Authorizing Provider  pantoprazole  (PROTONIX ) 40 MG tablet Take 1 tablet (40 mg total) by mouth daily. 02/06/24  Yes Alissa April, MD  acetaminophen  (TYLENOL ) 500 MG tablet Take 500 mg by mouth every 4 (four) hours as needed. 05/06/20   [provider]  albuterol  (VENTOLIN  HFA) 108 (90 Base) MCG/ACT inhaler Inhale 2 puffs into the lungs every 4 (four) hours as needed for wheezing or shortness of breath. 08/14/23   Rochester Chuck, MD  Albuterol -Budesonide  (AIRSUPRA ) 90-80 MCG/ACT AERO Inhale 2 puffs into the lungs every 4 (four) hours as needed. 12/19/23   Rochester Chuck, MD  arformoterol  (BROVANA ) 15 MCG/2ML NEBU Take 2 mLs (15 mcg total) by nebulization 2 (two) times daily. 12/24/23   Rochester Chuck, MD  atorvastatin  (LIPITOR) 20 MG tablet Take 20 mg by mouth daily. 03/30/21   [provider]  budesonide  (PULMICORT ) 0.5 MG/2ML nebulizer solution  Take 2 mLs (0.5 mg total) by nebulization 2 (two) times daily. 08/14/23   Rochester Chuck, MD  cetirizine  (ZYRTEC ) 10 MG tablet Take 1 tablet (10 mg total) by mouth 2 (two) times daily. 12/12/21   Rochester Chuck, MD  colchicine  0.6 MG tablet Take 0.6 mg by mouth daily.    [provider]  EPINEPHrine  (EPIPEN  2-PAK) 0.3 mg/0.3 mL IJ SOAJ injection Inject 0.3 mLs (0.3 mg total) into the muscle as needed for anaphylaxis. 07/03/19   Rochester Chuck, MD  ipratropium-albuterol  (DUONEB) 0.5-2.5 (3) MG/3ML SOLN Inhale 3 mLs into the lungs 2 (two) times daily. 11/08/23   Rochester Chuck, MD  levalbuterol  (XOPENEX ) 1.25 MG/3ML nebulizer solution Take 1.25 mg by nebulization every 4 (four) hours as needed for wheezing. 06/26/22   Rochester Chuck, MD  loratadine  (CLARITIN ) 10 MG tablet Take 1 tablet (10 mg total) by mouth daily as needed for allergies or itching. 12/18/21   Tinnie Forehand, FNP  nitroGLYCERIN  (NITROSTAT ) 0.4 MG SL tablet Place 1 tablet (0.4 mg total) under the tongue every 5 (five) minutes as needed for chest pain. 06/21/15   Farris Hong, PA-C  OXYGEN  Inhale 3-4 L into the lungs at bedtime. DURING THE DAY IF NEEDED Reported on 11/25/2015    [provider]  predniSONE  (DELTASONE ) 10 MG tablet Take 10 mg daily. 08/14/23   Rochester Chuck, MD  Respiratory Therapy Supplies (NEBULIZER/TUBING/MOUTHPIECE) KIT 1 each by Does not apply route as directed. 07/24/19   Rochester Chuck, MD  revefenacin  (YUPELRI ) 175 MCG/3ML nebulizer solution Take 3 mLs (175 mcg total) by nebulization daily. 08/15/23   Rochester Chuck, MD  traMADol  (ULTRAM -ER) 300 MG 24 hr tablet Take 300 mg by mouth daily as needed for pain.    [provider]      Allergies    Aspirin , Benralizumab, Hornet venom, Iodinated contrast media, Iodine, Levaquin [levofloxacin in d5w], Nsaids, Penicillins, Perforomist  [formoterol ], Tolmetin, Buprenorphine hcl, Morphine  and  codeine, and Oxycodone     Review of Systems   Review of Systems  All other systems reviewed and are negative.   Physical Exam Updated Vital Signs BP (!) 125/99 (BP Location: Left Arm)   Pulse 75   Temp 98.4 F (36.9 C) (Oral)   Resp (!) 22   SpO2 98%  Physical Exam Vitals and nursing note reviewed.   73 year old male, resting comfortably and in no acute distress. Vital signs are significant for elevated diastolic blood pressure and slightly elevated respiratory rate. Oxygen  saturation is 98%, which is normal. Head is normocephalic and atraumatic. PERRLA, EOMI. Oropharynx is clear.  There is no pooling of secretions. Neck is nontender and supple without adenopathy.  There is no stridor. Lungs have mild scattered expiratory wheezes.  There are no rales or rhonchi. Chest is nontender. Heart has regular rate and rhythm without murmur. Abdomen is soft, flat, nontender. Rectal: Normal sphincter tone, no stool in the ampulla. Extremities have no cyanosis or edema. Skin is warm and dry without rash. Neurologic: Mental status is normal, cranial nerves are intact, moves all extremities equally.  ED Results / Procedures / Treatments   Labs (all labs ordered are listed, but only abnormal results are displayed) Labs Reviewed  COMPREHENSIVE METABOLIC PANEL WITH GFR - Abnormal; Notable for the following components:      Result Value   Sodium 130 (*)    CO2 21 (*)    Glucose, Bld 116 (*)    All other components within normal limits  CBC WITH DIFFERENTIAL/PLATELET    EKG None  Radiology CT Chest Wo Contrast Result Date: 02/06/2024 CLINICAL DATA:  Dysphagia with difficulty swallowing. Food feels like food is stuck in the throat. History of basal cell carcinoma, melanoma and prostate cancer. EXAM: CT CHEST WITHOUT CONTRAST TECHNIQUE: Multidetector CT imaging of the chest was performed following the standard protocol without IV contrast. RADIATION DOSE REDUCTION: This exam was performed  according to the departmental dose-optimization program which includes automated exposure control, adjustment of the mA and/or kV according to patient size and/or use of iterative reconstruction technique. COMPARISON:  None Available. FINDINGS: Cardiovascular: Heart size appears normal.  No pericardial effusion. Mediastinum/Nodes: Thyroid  gland and trachea appear within normal limits. Normal appearance of the esophagus. No mediastinal or hilar adenopathy. Lungs/Pleura: No pleural effusion. No airspace consolidation or pneumothorax. No interstitial edema. Bilateral peripheral predominant subpleural banding, scarring and ground-glass attenuation noted with a lower lung zone predominance. Within the left upper lobe there is a part solid lung nodule which measures 2.7 cm. If there is an internal solid component measuring 7 mm, image 50/5. Medial peripheral solid component is also noted measuring 0.6 cm, image 48/5. Calcified granuloma noted in the superior segment of left lower lobe. Upper Abdomen: No acute abnormality.  Status post cholecystectomy. Musculoskeletal: No chest wall mass or suspicious bone lesions identified. IMPRESSION: 1. No acute findings within the chest. 2.  Normal appearance of the esophagus. 3. There is a part solid lung nodule within the left upper lobe which measures 2.7 cm. There is an internal solid component measuring 7 mm. Medial peripheral solid component is also noted measuring 0.6 cm. Follow-up non-contrast CT recommended at 3-6 months to confirm persistence. If unchanged, and solid component remains <6 mm, annual CT is recommended until 5 years of stability has been established. If persistent these nodules should be considered highly suspicious if the solid component of the nodule is 6 mm or greater in size and enlarging. This recommendation follows the consensus statement: Guidelines for Management of Incidental Pulmonary Nodules Detected on CT Images:From the Fleischner Society 2017;  published online before print (10.1148/radiol.4098119147). 4. Bilateral peripheral predominant subpleural banding, scarring and ground-glass attenuation noted with a lower lung zone predominance. Findings are nonspecific but may reflect early interstitial lung disease. Electronically Signed   By: Kimberley Penman M.D.   On: 02/06/2024 06:25    Procedures Procedures    Medications Ordered in ED Medications  pantoprazole  (PROTONIX ) EC tablet 40 mg (has no administration in time range)  ondansetron  (ZOFRAN ) injection 4 mg (4 mg Intravenous Given 02/06/24 0608)    ED Course/ Medical Decision Making/ A&P                                 Medical Decision Making Amount and/or Complexity of Data Reviewed Labs: ordered. Radiology: ordered.  Risk Prescription drug management.   Complaints of food being stuck in his esophagus, but clinical picture is not that of esophageal obstruction.  Chronic constipation without evidence of fecal impaction on exam.  I have reviewed his past records, and on 03/22/2015 he had an upper endoscopy which showed esophageal stricture which was treated with dilatation, put on 03/16/2022 he had an upper endoscopy which showed normal esophagus although he did have evidence of gastritis.  I have ordered screening labs of CBC and comprehensive metabolic panel and I am sending him for CT of chest.  I suspect he will need GI follow-up with repeat upper endoscopy.  I have reviewed his laboratory test, my interpretation is elevated random glucose which will need to be followed as an outpatient, hyponatremia which is not felt to be clinically significant, normal CBC.  CT of the chest shows no acute findings with normal appearance of the esophagus.  Incidental finding of a left upper lobe nodule which will need to be followed up in 3-6 months.  I have explained this to the patient.  I am discharging him with a prescription for pantoprazole  and I am referring him back to his  gastroenterologist for endoscopy to see if he has an esophageal stricture.  I have advised him to return to the emergency department if he ever gets to the point where he is unable to swallow.  Final Clinical Impression(s) / ED Diagnoses Final diagnoses:  Dysphagia, unspecified type  Lung nodule  Hyponatremia  Elevated random blood glucose level  Chronic constipation    Rx / DC Orders ED Discharge Orders          Ordered    pantoprazole  (PROTONIX ) 40 MG tablet  Daily        02/06/24 0724              Alissa April, MD 02/06/24 [phone]

## 2024-02-06 NOTE — Telephone Encounter (Signed)
 Good Evening Dr. Elvin Hammer, I received a call from this patient and he stated that he was recently seen in the ED for Dysphagia and was told to call our office to schedule an appointment. It looks like patient was being seen by a different gastroenterologist. I called their office to see if patient was ever seen or has ever had a colonoscopy and endoscopy with them, and they stated that his was never seen. Patient appears to have had colonoscopy and EGD with you back in 2016.Would you please advise on scheduling.    Thank you.

## 2024-02-06 NOTE — ED Notes (Signed)
 Patient transported to CT

## 2024-02-10 ENCOUNTER — Telehealth: Payer: Self-pay | Admitting: Allergy & Immunology

## 2024-02-10 ENCOUNTER — Encounter: Payer: Self-pay | Admitting: Allergy & Immunology

## 2024-02-10 NOTE — Telephone Encounter (Signed)
 [employer] called and stated that they need more information about patient before authorizing 2 of his medications. Medications are Budesonide  and Brovana . Insurance company is wanting patients diagnosis and residence to process these medications. Insurance companys call back number is [phone] and fax number is [phone]

## 2024-02-10 NOTE — Telephone Encounter (Signed)
 Spoke with wife, Wallene Gum, advised I have spoken with [employer] and provided required information.

## 2024-02-10 NOTE — Telephone Encounter (Signed)
 Spoke with [employer]. Requested Information provided.

## 2024-02-11 ENCOUNTER — Other Ambulatory Visit: Payer: Self-pay

## 2024-02-11 ENCOUNTER — Ambulatory Visit: Payer: Medicare Other | Admitting: Allergy & Immunology

## 2024-02-11 ENCOUNTER — Telehealth: Payer: Self-pay | Admitting: Allergy & Immunology

## 2024-02-11 DIAGNOSIS — J4489 Other specified chronic obstructive pulmonary disease: Secondary | ICD-10-CM

## 2024-02-11 MED ORDER — IPRATROPIUM-ALBUTEROL 0.5-2.5 (3) MG/3ML IN SOLN: 3.0000 mL | Freq: Two times a day (BID) | RESPIRATORY_TRACT | 5 refills | Status: DC

## 2024-02-11 NOTE — Telephone Encounter (Signed)
 Can see any of our advanced practitioners

## 2024-02-11 NOTE — Telephone Encounter (Signed)
 Pt called to get a refill on ipratropium-albuterol  ipratropium-albuterol  (DUONEB) 0.5-2.5 (3) MG/3ML SOLN

## 2024-02-17 NOTE — Telephone Encounter (Signed)
 [facility] called and states they have been trying to reach [place] several time since last week and he is not answering his calls nor is he returning calls.  They stated they have no idea if he has received this medication or if he still needs it.  They are not going to reach out to him anymore.

## 2024-02-17 NOTE — Telephone Encounter (Signed)
 Fine with me

## 2024-02-25 NOTE — Telephone Encounter (Signed)
 Patient stated he's been trying to reach us  multiple times about his prescription. Patient called and stated he called [facility] about the ipratropium-albuterol . They told him that they didn't received the prescription. He informed me he was on the phone with them for two hrs. He also stated his insurance [employer] needed a prior auth for his ipratropium-albuterol  and needed Dr.gallagher to approve it.  Patient also wanted to inform to not give him a call after 1pm tomorrow as he will be having surgery.  Best Contact: [phone]

## 2024-02-25 NOTE — Telephone Encounter (Signed)
 Forwarding updated message to provider.

## 2024-02-26 NOTE — Telephone Encounter (Signed)
 I have no idea why he would need a PA for DuoNeb.  I will sign what ever.  Drexel Gentles, MD [facility] 

## 2024-03-06 ENCOUNTER — Other Ambulatory Visit: Payer: Self-pay

## 2024-03-06 DIAGNOSIS — J4489 Other specified chronic obstructive pulmonary disease: Secondary | ICD-10-CM

## 2024-03-06 MED ORDER — IPRATROPIUM-ALBUTEROL 0.5-2.5 (3) MG/3ML IN SOLN: 3.0000 mL | Freq: Two times a day (BID) | RESPIRATORY_TRACT | 5 refills | Status: AC

## 2024-03-06 NOTE — Telephone Encounter (Signed)
 Medication refill on Ipratropium - Albuterol  (DUO-NEB) has been sent to [facility]

## 2024-03-06 NOTE — Telephone Encounter (Addendum)
 Patient called stating he is needing his DuoNeb prescription sent to [facility] on [address].

## 2024-03-16 NOTE — Telephone Encounter (Signed)
 Patient called in - DOB/NEED Updated DPR/Pharmacy verified - stated Ipratropium - Albuterol  (DUONEB) prescription has never been sent to [facility] [address]  Original Rx [facility] - New Rx sent on 03/06/24 to [facility]  [facility] - spoke to Ben,Pharmacist- DOB verified - stated prescription was received and filled since 03/06/24 - will be put back today if not picked up.  Called patient - spoke to spouse, Wallene Gum - DOB/NEED Updated DPR/Pharmacy verified - advised of the above notation.  Wallene Gum verbalized understanding to all - will relay message to patient, no further questions.

## 2024-03-17 LAB — LAB REPORT - SCANNED
A1c: 6.2
Albumin, Urine POC: 1.6
Albumin/Creatinine Ratio, Urine, POC: 12
Calcium: 9.6
Creatinine, POC: 135 mg/dL
EGFR: 63
HM Hepatitis Screen: NEGATIVE
PSA, Total: 4.98
PTH: 71

## 2024-04-06 ENCOUNTER — Ambulatory Visit: Admitting: Gastroenterology

## 2024-04-06 NOTE — Progress Notes (Deleted)
 Chief Complaint: Primary GI MD:  HPI: Discussed the use of AI scribe software for clinical note transcription with the patient, who gave verbal consent to proceed.  Previously seen by [facility] last in 05/2022 history of GERD, dysphagia, constipation, rectal bleeding.  Also history of hemorrhoidal banding.  Had EGD/colonoscopy June 2023 as below large internal hemorrhoids and gastric erosions, 73 year old's.  History of Present Illness      PREVIOUS GI WORKUP   EGD/colonoscopy 03/16/22:  The esophagus appeared normal. Stricture is not identified. Random biopsies are obtained from distal and proximal esophagus for EoE.  Gastric exam reveals antral erosions. Hill flap valve grade I. Random gastric biopsies are obtained from antrum, angularis and body for H-pylori.  The duodenal bulb and 2nd part of the duodenum appeared normal.  Large, internal hemorrhoids  Three sessile polyps measuring from 4 mm up to 6 mm in the hepatic flexure; completely removed en bloc by cold snare and retrieved specimen  One 5 mm sessile polyp in the descending colon; completely removed en bloc by cold snare and retrieved specimen  One 4 mm sessile polyp in the rectum; completely removed en bloc by cold snare and retrieved specimen Final Pathologic Diagnosis   A. DISTAL ESOPHAGEAL, BIOPSY:   SQUAMOUS ESOPHAGEAL MUCOSA WITH FEATURES OF REFLUX ESOPHAGITIS AND SUPERFICIAL EROSION.   NO EVIDENCE OF ACUTE INFLAMMATION, EOSINOPHILIC ESOPHAGITIS OR MALIGNANCY.   B. PROXIMAL ESOPHAGEAL, BIOPSY:   SQUAMOUS ESOPHAGEAL MUCOSA WITH FEATURES OF MILD REFLUX ESOPHAGITIS.   NO EVIDENCE OF ACUTE INFLAMMATION, EOSINOPHILIC ESOPHAGITIS OR MALIGNANCY.   C. GASTRIC, BIOPSY:   GASTRIC ANTRAL AND OXYNTIC/BODY TYPE MUCOSA WITH NO SIGNIFICANT HISTOPATHOLOGIC ABNORMALITIES.   NO EVIDENCE OF ACUTE INFLAMMATION OR MALIGNANCY.   D. HEPATIC FLEXURE POLYP X3, BIOPSY:  TUBULAR ADENOMA, MULTIPLE FRAGMENTS.   E. DESCENDING COLON  POLYP, BIOPSY:   TUBULAR ADENOMA.   F. RECTAL POLYP, BIOPSY:  HYPERPLASTIC POLYP.   Past Medical History:  Diagnosis Date   Anxiety disorder    Arthritis    BILATERAL SHOULDERS, ELBOWS AND HANDS AND LEFT HIP AND KNEES--HX CORTISONE SHOTS IN SHOULDERS, ELBOWS, HIP AND KNEES   Asthma    Basal cell carcinoma of back 04/30/2016   Dermatologist- Dr Shona;   MOHS sx- Dr Lyle    Bladder outlet obstruction    Chronic idiopathic constipation 05/08/2015   Complication of anesthesia    DIFFICULT WAKING    COPD (chronic obstructive pulmonary disease) Gold C Frequent exacerbations 01/13/2014   Spiro 07/08/14: FeV1 51% FeV1/FVC 66% FVC 59% 10/5/2015ONO RA was normal  10/13/2014  ONO on RA NORMAL    Depression    Diabetes mellitus without complication (HCC)    BODERLINE - DIET CONTROL   Former smoker 01/13/2014   Gastroesophageal reflux disease without esophagitis    Heavy alcohol use 04/30/2016   History of oxygen  administration    oxygen  use 2 l/m nasally at bedtime and exertional occasions   History of rheumatic fever    Hx of multiple concussions    x 2 per patient    Hypertension    Melanoma (HCC)    Nocturnal oxygen  desaturation    USES O2 NIGHTLY   OSA (obstructive sleep apnea) 01/13/2014   PONV (postoperative nausea and vomiting)    Post-polio syndrome    polio at age 41--PT WAS IN IRON LUNG; PT WAS IN W/C UNTIL AGE 3; STILL HAS WEAKNESS RIGHT SIDE   Prostate cancer (HCC)    Schizophrenia (HCC)    Tuberculosis 1984  Hosp 4 months rx , left early    Urticaria     Past Surgical History:  Procedure Laterality Date   ADENOIDECTOMY     CARDIOVASCULAR STRESS TEST  06-08-2014  dr dominick   normal lexiscan  study/  no ischemia/  not gated due to PAC's   COLONOSCOPY N/A 05/03/2015   Procedure: COLONOSCOPY;  Surgeon: Norleen LOISE Kiang, MD;  Location: [facility];  Service: Endoscopy;  Laterality: N/A;   CYSTOSCOPY N/A 10/25/2015   Procedure: CYSTOSCOPY;  Surgeon: Norleen Seltzer, MD;   Location: [facility];  Service: Urology;  Laterality: N/A;   CYSTOSCOPY W/ CYSTOGRAM/  TRANSRECTAL ULTRASOUND PROSTATE BX  03-22-2009   ESOPHAGOGASTRODUODENOSCOPY N/A 03/22/2015   Procedure: ESOPHAGOGASTRODUODENOSCOPY (EGD) with dilation;  Surgeon: Norleen LOISE Kiang, MD;  Location: [facility];  Service: Endoscopy;  Laterality: N/A;   excision of skin lesion     LAPAROSCOPIC CHOLECYSTECTOMY  2013   left elbow surgery      due to fracture    NASAL SEPTUM SURGERY  2000   OTHER SURGICAL HISTORY      Muscle & bone Graft/Polio   polio surgeries      14 polio surgeries    PROSTATE BIOPSY N/A 09/28/2014   Procedure: PROSTATE ULTRASOUND/BIOPSY;  Surgeon: Norleen JINNY Seltzer, MD;  Location: [facility];  Service: Urology;  Laterality: N/A;   PROSTATE BIOPSY N/A 10/25/2015   Procedure: PROSTATE BIOPSY AND ULTRASOUND;  Surgeon: Norleen Seltzer, MD;  Location: [facility];  Service: Urology;  Laterality: N/A;   SAVORY DILATION N/A 03/22/2015   Procedure: SAVORY DILATION;  Surgeon: Norleen LOISE Kiang, MD;  Location: [facility];  Service: Endoscopy;  Laterality: N/A;   SHOULDER ARTHROSCOPY WITH OPEN ROTATOR CUFF REPAIR Bilateral 2013  &  1999   removal spurs and bursectomy   TONSILLECTOMY     TRANSURETHRAL INCISION OF BLADDER NECK N/A 10/25/2015   Procedure:  TRANSURETHRAL INCISION OF BLADDER NECK;  Surgeon: Norleen Seltzer, MD;  Location: [facility];  Service: Urology;  Laterality: N/A;   TRANSURETHRAL RESECTION OF PROSTATE N/A 09/28/2014   Procedure: TRANSURETHRAL RESECTION OF THE PROSTATE (TURP);  Surgeon: Norleen JINNY Seltzer, MD;  Location: [facility];  Service: Urology;  Laterality: N/A;   URETEROSOPY STONE EXTRACTION  2000    Current Outpatient Medications  Medication Sig Dispense Refill   acetaminophen  (TYLENOL ) 500 MG tablet Take 500 mg by mouth every 4 (four) hours as needed.     albuterol  (VENTOLIN  HFA) 108 (90 Base) MCG/ACT inhaler Inhale 2 puffs into the lungs every 4 (four) hours as needed for wheezing or shortness of breath. 18 g 1    Albuterol -Budesonide  (AIRSUPRA ) 90-80 MCG/ACT AERO Inhale 2 puffs into the lungs every 4 (four) hours as needed. 10.7 g 3   arformoterol  (BROVANA ) 15 MCG/2ML NEBU Take 2 mLs (15 mcg total) by nebulization 2 (two) times daily. 540 mL 0   atorvastatin  (LIPITOR) 20 MG tablet Take 20 mg by mouth daily.     budesonide  (PULMICORT ) 0.5 MG/2ML nebulizer solution Take 2 mLs (0.5 mg total) by nebulization 2 (two) times daily. 360 mL 5   cetirizine  (ZYRTEC ) 10 MG tablet Take 1 tablet (10 mg total) by mouth 2 (two) times daily. 60 tablet 2   colchicine  0.6 MG tablet Take 0.6 mg by mouth daily.     EPINEPHrine  (EPIPEN  2-PAK) 0.3 mg/0.3 mL IJ SOAJ injection Inject 0.3 mLs (0.3 mg total) into the muscle as needed for anaphylaxis. 1 each 2   ipratropium-albuterol  (DUONEB) 0.5-2.5 (3) MG/3ML SOLN  Inhale 3 mLs into the lungs 2 (two) times daily. 270 mL 5   levalbuterol  (XOPENEX ) 1.25 MG/3ML nebulizer solution Take 1.25 mg by nebulization every 4 (four) hours as needed for wheezing. 72 mL 5   loratadine  (CLARITIN ) 10 MG tablet Take 1 tablet (10 mg total) by mouth daily as needed for allergies or itching. 30 tablet 5   nitroGLYCERIN  (NITROSTAT ) 0.4 MG SL tablet Place 1 tablet (0.4 mg total) under the tongue every 5 (five) minutes as needed for chest pain. 30 tablet 3   OXYGEN  Inhale 3-4 L into the lungs at bedtime. DURING THE DAY IF NEEDED Reported on 11/25/2015     pantoprazole  (PROTONIX ) 40 MG tablet Take 1 tablet (40 mg total) by mouth daily. 30 tablet 0   predniSONE  (DELTASONE ) 10 MG tablet Take 10 mg daily. 90 tablet 1   Respiratory Therapy Supplies (NEBULIZER/TUBING/MOUTHPIECE) KIT 1 each by Does not apply route as directed. 2 kit 5   revefenacin  (YUPELRI ) 175 MCG/3ML nebulizer solution Take 3 mLs (175 mcg total) by nebulization daily. 90 mL 1   traMADol  (ULTRAM -ER) 300 MG 24 hr tablet Take 300 mg by mouth daily as needed for pain.     No current facility-administered medications for this visit.    Allergies  as of 04/06/2024 - Review Complete 02/06/2024  Allergen Reaction Noted   Aspirin  Anaphylaxis and Other (See Comments) 11/20/2011   Benralizumab Hives 05/30/2018   Hornet venom Anaphylaxis 07/22/2016   Iodinated contrast media Anaphylaxis and Other (See Comments) 11/20/2011   Iodine Anaphylaxis 05/26/2013   Levaquin [levofloxacin in d5w] Shortness Of Breath, Diarrhea, Swelling, and Other (See Comments) 08/23/2014   Nsaids Anaphylaxis and Other (See Comments) 01/14/2014   Penicillins Anaphylaxis 11/20/2011   Perforomist  [formoterol ] Shortness Of Breath and Other (See Comments) 10/24/2016   Tolmetin Anaphylaxis 11/22/2015   Buprenorphine hcl Nausea And Vomiting and Other (See Comments) 11/22/2015   Morphine  and codeine Nausea And Vomiting and Other (See Comments) 01/21/2012   Oxycodone  Itching and Rash 02/21/2016    Family History  Problem Relation Age of Onset   Alzheimer's disease Father 4       Deceased   Stomach cancer Father    Heart attack Father    Heart disease Father    Skin cancer Mother        Facial-Living   Alcohol abuse Sister        x2   Mental illness Sister        x2   Alcohol abuse Sister    Diabetes Maternal Aunt        x2   Thyroid  disease Maternal Aunt        x4   Diabetes Maternal Uncle    Tuberculosis Paternal Grandfather    Tuberculosis Paternal Grandmother    Alzheimer's disease Paternal Aunt    Alzheimer's disease Paternal Uncle    Colon cancer Neg Hx    Colon polyps Neg Hx    Crohn's disease Neg Hx    Ulcerative colitis Neg Hx    Allergic rhinitis Neg Hx    Angioedema Neg Hx    Asthma Neg Hx    Eczema Neg Hx    Urticaria Neg Hx     Social History   Socioeconomic History   Marital status: Married    Spouse name: Not on file   Number of children: Not on file   Years of education: Not on file   Highest education level: Not on file  Occupational History  Occupation: Retired  Tobacco Use   Smoking status: Former    Types: Pipe     Quit date: 08/25/2013    Years since quitting: 10.6   Smokeless tobacco: Former    Types: Chew   Tobacco comments:    5-6 times smoking a pipe  Vaping Use   Vaping status: Never Used  Substance and Sexual Activity   Alcohol use: Yes   Drug use: No   Sexual activity: Yes    Birth control/protection: None  Other Topics Concern   Not on file  Social History Narrative   Not on file   Social Drivers of Health   Financial Resource Strain: Not on file  Food Insecurity: Not on file  Transportation Needs: Not on file  Physical Activity: Not on file  Stress: Not on file  Social Connections: Unknown (02/23/2022)   Received [facility]   Social Network    Social Network: Not on file  Intimate Partner Violence: Unknown (01/16/2022)   Received from [facility]   HITS    Physically Hurt: Not on file    Insult or Talk Down To: Not on file    Threaten Physical Harm: Not on file    Scream or Curse: Not on file    Review of Systems:    Constitutional: No weight loss, fever, chills, weakness or fatigue HEENT: Eyes: No change in vision               Ears, Nose, Throat:  No change in hearing or congestion Skin: No rash or itching Cardiovascular: No chest pain, chest pressure or palpitations   Respiratory: No SOB or cough Gastrointestinal: See HPI and otherwise negative Genitourinary: No dysuria or change in urinary frequency Neurological: No headache, dizziness or syncope Musculoskeletal: No new muscle or joint pain Hematologic: No bleeding or bruising Psychiatric: No history of depression or anxiety    Physical Exam:  Vital signs: There were no vitals taken for this visit.  Constitutional: NAD, alert and cooperative Head:  Normocephalic and atraumatic. Eyes:   PEERL, EOMI. No icterus. Conjunctiva pink. Respiratory: Respirations even and unlabored. Lungs clear to auscultation bilaterally.   No wheezes, crackles, or rhonchi.  Cardiovascular:  Regular rate and rhythm. No  peripheral edema, cyanosis or pallor.  Gastrointestinal:  Soft, nondistended, nontender. No rebound or guarding. Normal bowel sounds. No appreciable masses or hepatomegaly. Rectal:  Declines Msk:  Symmetrical without gross deformities. Without edema, no deformity or joint abnormality.  Neurologic:  Alert and  oriented x4;  grossly normal neurologically.  Skin:   Dry and intact without significant lesions or rashes. Psychiatric: Oriented to person, place and time. Demonstrates good judgement and reason without abnormal affect or behaviors.  Physical Exam    RELEVANT LABS AND IMAGING: CBC    Component Value Date/Time   WBC 5.9 02/06/2024 0510   RBC 4.79 02/06/2024 0510   HGB 15.5 02/06/2024 0510   HGB 15.1 11/09/2021 1711   HCT 45.1 02/06/2024 0510   HCT 45.5 11/09/2021 1711   PLT 226 02/06/2024 0510   PLT 271 11/09/2021 1711   MCV 94.2 02/06/2024 0510   MCV 93 11/09/2021 1711   MCH 32.4 02/06/2024 0510   MCHC 34.4 02/06/2024 0510   RDW 13.1 02/06/2024 0510   RDW 12.9 11/09/2021 1711   LYMPHSABS 1.4 02/06/2024 0510   LYMPHSABS 1.5 11/09/2021 1711   MONOABS 0.5 02/06/2024 0510   EOSABS 0.4 02/06/2024 0510   EOSABS 0.4 11/09/2021 1711   BASOSABS 0.1  02/06/2024 0510   BASOSABS 0.1 11/09/2021 1711    CMP     Component Value Date/Time   NA 130 (L) 02/06/2024 0510   NA 133 (L) 06/29/2020 1723   K 4.4 02/06/2024 0510   CL 100 02/06/2024 0510   CO2 21 (L) 02/06/2024 0510   GLUCOSE 116 (H) 02/06/2024 0510   BUN 14 02/06/2024 0510   BUN 16 06/29/2020 1723   CREATININE 0.94 02/06/2024 0510   CREATININE 1.22 05/07/2016 1024   CALCIUM  9.1 02/06/2024 0510   PROT 7.3 02/06/2024 0510   PROT 7.5 06/29/2020 1723   ALBUMIN 4.1 02/06/2024 0510   ALBUMIN 4.6 06/29/2020 1723   AST 28 02/06/2024 0510   ALT 28 02/06/2024 0510   ALKPHOS 56 02/06/2024 0510   BILITOT 0.7 02/06/2024 0510   BILITOT 0.6 06/29/2020 1723   GFRNONAA >60 02/06/2024 0510   GFRNONAA 61 05/07/2016 1024    GFRAA >60 07/07/2020 0040   GFRAA 71 05/07/2016 1024     Assessment/Plan:   Assessment and Plan Assessment & Plan    Dysphagia EGD 03/2022 negative for EOE, stricture identified.  No empiric dilation performed at that time but has been dilated on EGD in 2016.  CT chest 01/2023 with normal appearance of esophagus.  Chronic constipation Recommended MiraLAX .  Linzess  makes some nausea.  Uses enemas regularly.  Colonoscopy 03/2022 with 4 tubular adenomas.  CTAP without contrast 12/2023 unremarkable  Rectal bleeding History of large internal hemorrhoids colonoscopy 03/2022 s/p banding.  Likely exacerbated by chronic constipation to  History of colon polyps Tubular adenoma x 4 on colonoscopy 03/2022 with recall 03/2025 - Repeat 03/2025  GERD History of gastric erosions on EGD 03/2022 with H. pylori negative.  Resolved 40 Mg.  COPD  Prostate cancer    Forrest Demuro Mollie RIGGERS [facility] 04/06/2024, 10:21 AM  Cc: Latisha Ronal Crank,*

## 2024-04-28 ENCOUNTER — Emergency Department (HOSPITAL_COMMUNITY)
Admission: EM | Admit: 2024-04-28 | Discharge: 2024-04-29 | Disposition: A | Discharge status: Home / Self Care | Attending: Emergency Medicine | Admitting: Emergency Medicine

## 2024-04-28 DIAGNOSIS — Z79899 Other long term (current) drug therapy: Secondary | ICD-10-CM | POA: Insufficient documentation

## 2024-04-28 DIAGNOSIS — E119 Type 2 diabetes mellitus without complications: Secondary | ICD-10-CM | POA: Insufficient documentation

## 2024-04-28 DIAGNOSIS — Z8546 Personal history of malignant neoplasm of prostate: Secondary | ICD-10-CM | POA: Insufficient documentation

## 2024-04-28 DIAGNOSIS — R110 Nausea: Secondary | ICD-10-CM | POA: Diagnosis present

## 2024-04-28 DIAGNOSIS — R519 Headache, unspecified: Secondary | ICD-10-CM | POA: Diagnosis not present

## 2024-04-28 DIAGNOSIS — I10 Essential (primary) hypertension: Secondary | ICD-10-CM | POA: Insufficient documentation

## 2024-04-28 DIAGNOSIS — E860 Dehydration: Secondary | ICD-10-CM | POA: Insufficient documentation

## 2024-04-28 DIAGNOSIS — J45909 Unspecified asthma, uncomplicated: Secondary | ICD-10-CM | POA: Insufficient documentation

## 2024-04-29 ENCOUNTER — Other Ambulatory Visit: Payer: Self-pay

## 2024-04-29 ENCOUNTER — Emergency Department (HOSPITAL_COMMUNITY)

## 2024-04-29 LAB — COMPREHENSIVE METABOLIC PANEL WITH GFR
ALT: 33 U/L (ref 0–44)
AST: 29 U/L (ref 15–41)
Albumin: 4.2 g/dL (ref 3.5–5.0)
Alkaline Phosphatase: 61 U/L (ref 38–126)
Anion gap: 9 (ref 5–15)
BUN: 17 mg/dL (ref 8–23)
CO2: 23 mmol/L (ref 22–32)
Calcium: 8.9 mg/dL (ref 8.9–10.3)
Chloride: 96 mmol/L — ABNORMAL LOW (ref 98–111)
Creatinine, Ser: 1.24 mg/dL (ref 0.61–1.24)
GFR, Estimated: >60 mL/min (ref >60)
Glucose, Bld: 124 mg/dL — ABNORMAL HIGH (ref 70–99)
Potassium: 4.7 mmol/L (ref 3.5–5.1)
Sodium: 128 mmol/L — ABNORMAL LOW (ref 135–145)
Total Bilirubin: 0.8 mg/dL (ref 0.0–1.2)
Total Protein: 7.7 g/dL (ref 6.5–8.1)

## 2024-04-29 LAB — CBC WITH DIFFERENTIAL/PLATELET
Abs Immature Granulocytes: 0.04 K/uL (ref 0.00–0.07)
Basophils Absolute: 0.1 K/uL (ref 0.0–0.1)
Basophils Relative: 1 %
Eosinophils Absolute: 0.3 K/uL (ref 0.0–0.5)
Eosinophils Relative: 4 %
HCT: 45.5 % (ref 39.0–52.0)
Hemoglobin: 15 g/dL (ref 13.0–17.0)
Immature Granulocytes: 1 %
Lymphocytes Relative: 15 %
Lymphs Abs: 1.2 K/uL (ref 0.7–4.0)
MCH: 31.6 pg (ref 26.0–34.0)
MCHC: 33 g/dL (ref 30.0–36.0)
MCV: 96 fL (ref 80.0–100.0)
Monocytes Absolute: 0.5 K/uL (ref 0.1–1.0)
Monocytes Relative: 7 %
Neutro Abs: 5.6 K/uL (ref 1.7–7.7)
Neutrophils Relative %: 72 %
Platelets: 210 K/uL (ref 150–400)
RBC: 4.74 MIL/uL (ref 4.22–5.81)
RDW: 12.6 % (ref 11.5–15.5)
WBC: 7.7 K/uL (ref 4.0–10.5)
nRBC: 0 % (ref 0.0–0.2)

## 2024-04-29 LAB — URINALYSIS, ROUTINE W REFLEX MICROSCOPIC
Bilirubin Urine: NEGATIVE
Glucose, UA: NEGATIVE mg/dL
Hgb urine dipstick: NEGATIVE
Ketones, ur: NEGATIVE mg/dL
Leukocytes,Ua: NEGATIVE
Nitrite: NEGATIVE
Protein, ur: NEGATIVE mg/dL
Specific Gravity, Urine: 1.011 (ref 1.005–1.030)
pH: 5 (ref 5.0–8.0)

## 2024-04-29 LAB — CBG MONITORING, ED: Glucose-Capillary: 127 mg/dL — ABNORMAL HIGH (ref 70–99)

## 2024-04-29 LAB — LIPASE, BLOOD: Lipase: 47 U/L (ref 11–51)

## 2024-04-29 MED ORDER — ONDANSETRON HCL 4 MG/2ML IJ SOLN
4.0000 mg | Freq: Once | INTRAMUSCULAR | Status: AC
  Administered 2024-04-29: 4 mg via INTRAVENOUS
  Filled 2024-04-29: qty 2

## 2024-04-29 MED ORDER — SODIUM CHLORIDE 0.9 % IV BOLUS
1000.0000 mL | Freq: Once | INTRAVENOUS | Status: AC
  Administered 2024-04-29: 1000 mL via INTRAVENOUS

## 2024-04-29 MED ORDER — HYDRALAZINE HCL 20 MG/ML IJ SOLN
5.0000 mg | Freq: Once | INTRAMUSCULAR | Status: AC
  Administered 2024-04-29: 5 mg via INTRAVENOUS
  Filled 2024-04-29: qty 1

## 2024-04-29 NOTE — ED Triage Notes (Signed)
 Patient BIB EMS from home for N/V since 1300.  Patient treated 7/7 with new meds and hasn't been taking them appropriately.  Patient took Zofran  prior to arrival of EMS and has a Ziopatch monitor on since 7/14.  Patient is vision impaired on the left.

## 2024-04-29 NOTE — ED Provider Notes (Signed)
 [place] EMERGENCY DEPARTMENT AT [facility] Provider Note   CSN: [id number] Arrival date & time: 04/28/24  2352     Patient presents with: Abdominal Pain and Nausea   [name] is a 73 y.o. male with history of alcohol use, arthritis, asthma, bladder outlet obstruction, diabetes, GERD, hypertension, prostate cancer, schizophrenia.  Patient presents to ED for evaluation of multiple issues.  States that he is here for dehydration, blood pressure issues, nausea.  Reports that he was working in the yard all day today, was not hydrating himself, was out in the sun for prolonged periods of time.  Reports that because of staying at the sun for prolonged periods of time, he realized that he was very dehydrated and went inside.  States that he drink multiple Gatorade's which she reports further dehydrated him.  He reports that Gatorade dehydrates him because the salt content.  He states that he did have 1 episode of nausea and vomiting.  States he checked his blood pressure and noted to be elevated.  Reports he recently started blood pressure medication, has taken his blood pressure medication today.  Denies blurred vision, chest pain, shortness of breath.  He is complaining of a headache.  Denies fevers at home.  Denies diarrhea.  Denies any abdominal pain.  Reports that he recently had cataract removed from right eye.    Abdominal Pain Associated symptoms: nausea        Prior to Admission medications   Medication Sig Start Date End Date Taking? Authorizing Provider  acetaminophen  (TYLENOL ) 500 MG tablet Take 500 mg by mouth every 4 (four) hours as needed. 05/06/20   [provider]  albuterol  (VENTOLIN  HFA) 108 (90 Base) MCG/ACT inhaler Inhale 2 puffs into the lungs every 4 (four) hours as needed for wheezing or shortness of breath. 08/14/23   Iva Marty Saltness, MD  Albuterol -Budesonide  (AIRSUPRA ) 90-80 MCG/ACT AERO Inhale 2 puffs into the lungs every 4 (four) hours  as needed. 12/19/23   Iva Marty Saltness, MD  arformoterol  (BROVANA ) 15 MCG/2ML NEBU Take 2 mLs (15 mcg total) by nebulization 2 (two) times daily. 12/24/23   Iva Marty Saltness, MD  atorvastatin  (LIPITOR) 20 MG tablet Take 20 mg by mouth daily. 03/30/21   [provider]  budesonide  (PULMICORT ) 0.5 MG/2ML nebulizer solution Take 2 mLs (0.5 mg total) by nebulization 2 (two) times daily. 08/14/23   Iva Marty Saltness, MD  cetirizine  (ZYRTEC ) 10 MG tablet Take 1 tablet (10 mg total) by mouth 2 (two) times daily. 12/12/21   Iva Marty Saltness, MD  colchicine  0.6 MG tablet Take 0.6 mg by mouth daily.    [provider]  EPINEPHrine  (EPIPEN  2-PAK) 0.3 mg/0.3 mL IJ SOAJ injection Inject 0.3 mLs (0.3 mg total) into the muscle as needed for anaphylaxis. 07/03/19   Iva Marty Saltness, MD  ipratropium-albuterol  (DUONEB) 0.5-2.5 (3) MG/3ML SOLN Inhale 3 mLs into the lungs 2 (two) times daily. 03/06/24   Iva Marty Saltness, MD  levalbuterol  (XOPENEX ) 1.25 MG/3ML nebulizer solution Take 1.25 mg by nebulization every 4 (four) hours as needed for wheezing. 06/26/22   Iva Marty Saltness, MD  loratadine  (CLARITIN ) 10 MG tablet Take 1 tablet (10 mg total) by mouth daily as needed for allergies or itching. 12/18/21   Cheryl Reusing, FNP  nitroGLYCERIN  (NITROSTAT ) 0.4 MG SL tablet Place 1 tablet (0.4 mg total) under the tongue every 5 (five) minutes as needed for chest pain. 06/21/15   Gladis Elsie BROCKS, PA-C  OXYGEN   Inhale 3-4 L into the lungs at bedtime. DURING THE DAY IF NEEDED Reported on 11/25/2015    [provider]  pantoprazole  (PROTONIX ) 40 MG tablet Take 1 tablet (40 mg total) by mouth daily. 02/06/24   Raford Lenis, MD  predniSONE  (DELTASONE ) 10 MG tablet Take 10 mg daily. 08/14/23   Iva Marty Saltness, MD  Respiratory Therapy Supplies (NEBULIZER/TUBING/MOUTHPIECE) KIT 1 each by Does not apply route as directed. 07/24/19   Iva Marty Saltness, MD  revefenacin  (YUPELRI )  175 MCG/3ML nebulizer solution Take 3 mLs (175 mcg total) by nebulization daily. 08/15/23   Iva Marty Saltness, MD  traMADol  (ULTRAM -ER) 300 MG 24 hr tablet Take 300 mg by mouth daily as needed for pain.    [provider]    Allergies: Aspirin , Benralizumab, Hornet venom, Iodinated contrast media, Iodine, Levaquin [levofloxacin in d5w], Nsaids, Penicillins, Perforomist  [formoterol ], Tolmetin, Buprenorphine hcl, Morphine  and codeine, and Oxycodone     Review of Systems  Gastrointestinal:  Positive for abdominal pain and nausea.  Neurological:  Positive for headaches.  All other systems reviewed and are negative.   Updated Vital Signs BP (!) 140/66 (BP Location: Left Arm)   Pulse 63   Temp 97.9 F (36.6 C) (Oral)   Resp 18   SpO2 97%   Physical Exam Vitals and nursing note reviewed.  Constitutional:      General: He is not in acute distress.    Appearance: He is well-developed.  HENT:     Head: Normocephalic and atraumatic.  Eyes:     Conjunctiva/sclera: Conjunctivae normal.  Cardiovascular:     Rate and Rhythm: Normal rate and regular rhythm.     Heart sounds: No murmur heard. Pulmonary:     Effort: Pulmonary effort is normal. No respiratory distress.     Breath sounds: Normal breath sounds.  Abdominal:     Palpations: Abdomen is soft.     Tenderness: There is no abdominal tenderness.     Comments: Abdomen soft and compressible with no tenderness noted.  Musculoskeletal:        General: No swelling.     Cervical back: Neck supple.  Skin:    General: Skin is warm and dry.     Capillary Refill: Capillary refill takes less than 2 seconds.  Neurological:     Mental Status: He is alert and oriented to person, place, and time. Mental status is at baseline.     Comments: CN III through XII intact.  Intact finger-nose, heel-to-shin.  No pronator drift or slurred speech.  Equal strength throughout.  Equal sensation throughout.  PERRL.  Tracks cross midline.   Psychiatric:        Mood and Affect: Mood normal.     (all labs ordered are listed, but only abnormal results are displayed) Labs Reviewed  COMPREHENSIVE METABOLIC PANEL WITH GFR - Abnormal; Notable for the following components:      Result Value   Sodium 128 (*)    Chloride 96 (*)    Glucose, Bld 124 (*)    All other components within normal limits  CBG MONITORING, ED - Abnormal; Notable for the following components:   Glucose-Capillary 127 (*)    All other components within normal limits  CBC WITH DIFFERENTIAL/PLATELET  LIPASE, BLOOD  URINALYSIS, ROUTINE W REFLEX MICROSCOPIC    EKG: None  Radiology: CT Head Wo Contrast Result Date: 04/29/2024 CLINICAL DATA:  Headache, sudden, severe EXAM: CT HEAD WITHOUT CONTRAST TECHNIQUE: Contiguous axial images were obtained from the base of  the skull through the vertex without intravenous contrast. RADIATION DOSE REDUCTION: This exam was performed according to the departmental dose-optimization program which includes automated exposure control, adjustment of the mA and/or kV according to patient size and/or use of iterative reconstruction technique. COMPARISON:  CT head August 02, 2023. FINDINGS: Brain: No evidence of acute infarction, hemorrhage, hydrocephalus, extra-axial collection or mass lesion/mass effect. Vascular: No hyperdense vessel. Skull: No acute fracture. Sinuses/Orbits: Clear sinuses.  No acute orbital findings. Other: No mastoid effusions. IMPRESSION: No evidence of acute intracranial abnormality. Electronically Signed   By: Gilmore GORMAN Molt M.D.   On: 04/29/2024 01:27     Procedures   Medications Ordered in the ED  ondansetron  (ZOFRAN ) injection 4 mg (has no administration in time range)  sodium chloride  0.9 % bolus 1,000 mL (1,000 mLs Intravenous New Bag/Given 04/29/24 0107)  hydrALAZINE  (APRESOLINE ) injection 5 mg (5 mg Intravenous Given 04/29/24 0109)  ondansetron  (ZOFRAN ) injection 4 mg (4 mg Intravenous Given 04/29/24  0109)     Medical Decision Making Amount and/or Complexity of Data Reviewed Labs: ordered. Radiology: ordered.  Risk Prescription drug management.   73 year old presents for evaluation.  Please see HPI for further details.  Patient presenting with headache, concern of high blood pressure, abdominal pain and nausea.  Reports that all of his symptoms began today after he was outside for a prolonged period of time.  He is concerned he is dehydrated.  He states he is compliant on his blood pressure medication.  He reports that he has had issues over the last 6 months controlling his blood pressure and he is currently having his medications titrated by his PCP.  Labs were collected include CBC, CMP, urinalysis, lipase and CBG.  The patient CBC is without abnormality, no leukocytosis or anemia.  His metabolic panel reveals sodium 871.  Patient chart was reviewed and he appears to be chronically hyponatremic.  His potassium is within normal limits.  He has no transaminitis or anion gap elevation.  Urinalysis is negative for all.  His lipase is 47.  His CBG is 127.  Patient was given 1 L of fluid, Zofran  and hydralazine .  On reassessment, patient reports that his nausea has decreased.  His blood pressure is currently 140/66.  At this time, patient will be discharged home.  He was advised to follow-up with his PCP which he voiced understanding with.  He was encouraged to continue taking all medications as prescribed.  He was given return precautions and he voiced understanding.  He is stable to discharge home.  Patient advised that he was unable to find ride home at this time.  He was provided with a taxi voucher.  Stable to discharge.    Final diagnoses:  Hypertension, unspecified type  Nausea  Dehydration    ED Discharge Orders     None          Ruthell Lonni JULIANNA DEVONNA 04/29/24 0319    Trine Raynell Moder, MD 05/04/24 [phone]

## 2024-04-29 NOTE — Discharge Instructions (Signed)
 It was a pleasure taking part in your care.  As discussed, please continue taking all medications as prescribed.  Please follow-up with your PCP for further management of your blood pressure.  Please return to the ED with any new symptoms such as chest pain, shortness of breath, blurred vision or headache in the setting of high blood pressure.  Please take Zofran  as prescribed.

## 2024-04-29 NOTE — ED Notes (Signed)
 CBG WAS 127

## 2024-06-23 ENCOUNTER — Ambulatory Visit: Admitting: Allergy & Immunology

## 2024-06-26 ENCOUNTER — Other Ambulatory Visit: Payer: Self-pay

## 2024-06-26 ENCOUNTER — Emergency Department (HOSPITAL_COMMUNITY)

## 2024-06-26 ENCOUNTER — Emergency Department (HOSPITAL_COMMUNITY)
Admission: EM | Admit: 2024-06-26 | Discharge: 2024-06-26 | Disposition: A | Discharge status: Home / Self Care | Attending: Emergency Medicine | Admitting: Emergency Medicine

## 2024-06-26 ENCOUNTER — Encounter (HOSPITAL_COMMUNITY): Payer: Self-pay | Admitting: Emergency Medicine

## 2024-06-26 DIAGNOSIS — E871 Hypo-osmolality and hyponatremia: Secondary | ICD-10-CM | POA: Insufficient documentation

## 2024-06-26 DIAGNOSIS — M79603 Pain in arm, unspecified: Secondary | ICD-10-CM | POA: Insufficient documentation

## 2024-06-26 DIAGNOSIS — R0602 Shortness of breath: Secondary | ICD-10-CM | POA: Diagnosis not present

## 2024-06-26 DIAGNOSIS — R079 Chest pain, unspecified: Secondary | ICD-10-CM | POA: Diagnosis present

## 2024-06-26 DIAGNOSIS — R5383 Other fatigue: Secondary | ICD-10-CM | POA: Diagnosis not present

## 2024-06-26 DIAGNOSIS — J45909 Unspecified asthma, uncomplicated: Secondary | ICD-10-CM | POA: Insufficient documentation

## 2024-06-26 LAB — CBC
HCT: 40.4 % (ref 39.0–52.0)
Hemoglobin: 13.6 g/dL (ref 13.0–17.0)
MCH: 32.9 pg (ref 26.0–34.0)
MCHC: 33.7 g/dL (ref 30.0–36.0)
MCV: 97.6 fL (ref 80.0–100.0)
Platelets: 238 K/uL (ref 150–400)
RBC: 4.14 MIL/uL — ABNORMAL LOW (ref 4.22–5.81)
RDW: 13.2 % (ref 11.5–15.5)
WBC: 6.5 K/uL (ref 4.0–10.5)
nRBC: 0 % (ref 0.0–0.2)

## 2024-06-26 LAB — BRAIN NATRIURETIC PEPTIDE: B Natriuretic Peptide: 84 pg/mL (ref 0.0–100.0)

## 2024-06-26 LAB — BASIC METABOLIC PANEL WITH GFR
Anion gap: 11 (ref 5–15)
BUN: 14 mg/dL (ref 8–23)
CO2: 23 mmol/L (ref 22–32)
Calcium: 8.6 mg/dL — ABNORMAL LOW (ref 8.9–10.3)
Chloride: 99 mmol/L (ref 98–111)
Creatinine, Ser: 1.51 mg/dL — ABNORMAL HIGH (ref 0.61–1.24)
GFR, Estimated: 48 mL/min — ABNORMAL LOW (ref >60)
Glucose, Bld: 102 mg/dL — ABNORMAL HIGH (ref 70–99)
Potassium: 4.3 mmol/L (ref 3.5–5.1)
Sodium: 133 mmol/L — ABNORMAL LOW (ref 135–145)

## 2024-06-26 LAB — TROPONIN I (HIGH SENSITIVITY)
Troponin I (High Sensitivity): 5 ng/L (ref <18)
Troponin I (High Sensitivity): 5 ng/L (ref <18)

## 2024-06-26 LAB — RESP PANEL BY RT-PCR (RSV, FLU A&B, COVID)  RVPGX2
Influenza A by PCR: NEGATIVE
Influenza B by PCR: NEGATIVE
Resp Syncytial Virus by PCR: NEGATIVE
SARS Coronavirus 2 by RT PCR: NEGATIVE

## 2024-06-26 NOTE — ED Triage Notes (Signed)
 PT [facility] from home for 2 episodes of CP today lasting about 5 min.  2nd episode resolved with 1 SL nitroglycerin  between 1330-1400..  CP has not returned.  Pt woke up with some fatigue this AM.  Sinus brady on 12-lead.  Allergic to ASA. 2-4L [place] PRN at baseline.  180/84 100% 2L [place] p:52, R 20   20G L. Forearm.

## 2024-06-26 NOTE — Discharge Instructions (Signed)
 1.  Follow-up you doctor for recheck 2.  Return to the emergency department if you have any new or concerning symptoms

## 2024-06-26 NOTE — ED Provider Notes (Signed)
 [place] EMERGENCY DEPARTMENT AT [facility] Provider Note   CSN: [id number] Arrival date & time: 06/26/24  1526     Patient presents with: No chief complaint on file.   [name] is a 73 y.o. male.   HPI Patient reports that he had an episode earlier today when he got a really sharp quick pain from his arm into his chest.  He reports the first 1 started about his elbow and stopped about in his shoulder and upper chest.  He reports that the second 1 occurred that came more from his hand and all the way up his arm.  He reports that he had stopped before he could take a nitro.  However he felt kind of tired and like he was winded after it happened so he rested and took a nitroglycerin .  He called EMS and reports that by the time they got there he felt like he was back to normal.  Patient reports he has oxygen  at home that he uses occasionally.  He has history of asthma    Prior to Admission medications   Medication Sig Start Date End Date Taking? Authorizing Provider  acetaminophen  (TYLENOL ) 500 MG tablet Take 500 mg by mouth every 4 (four) hours as needed. 05/06/20   [provider]  albuterol  (VENTOLIN  HFA) 108 (90 Base) MCG/ACT inhaler Inhale 2 puffs into the lungs every 4 (four) hours as needed for wheezing or shortness of breath. 08/14/23   Iva Marty Saltness, MD  Albuterol -Budesonide  (AIRSUPRA ) 90-80 MCG/ACT AERO Inhale 2 puffs into the lungs every 4 (four) hours as needed. 12/19/23   Iva Marty Saltness, MD  arformoterol  (BROVANA ) 15 MCG/2ML NEBU Take 2 mLs (15 mcg total) by nebulization 2 (two) times daily. 12/24/23   Iva Marty Saltness, MD  atorvastatin  (LIPITOR) 20 MG tablet Take 20 mg by mouth daily. 03/30/21   [provider]  budesonide  (PULMICORT ) 0.5 MG/2ML nebulizer solution Take 2 mLs (0.5 mg total) by nebulization 2 (two) times daily. 08/14/23   Iva Marty Saltness, MD  cetirizine  (ZYRTEC ) 10 MG tablet Take 1 tablet (10 mg total) by  mouth 2 (two) times daily. 12/12/21   Iva Marty Saltness, MD  colchicine  0.6 MG tablet Take 0.6 mg by mouth daily.    [provider]  EPINEPHrine  (EPIPEN  2-PAK) 0.3 mg/0.3 mL IJ SOAJ injection Inject 0.3 mLs (0.3 mg total) into the muscle as needed for anaphylaxis. 07/03/19   Iva Marty Saltness, MD  ipratropium-albuterol  (DUONEB) 0.5-2.5 (3) MG/3ML SOLN Inhale 3 mLs into the lungs 2 (two) times daily. 03/06/24   Iva Marty Saltness, MD  levalbuterol  (XOPENEX ) 1.25 MG/3ML nebulizer solution Take 1.25 mg by nebulization every 4 (four) hours as needed for wheezing. 06/26/22   Iva Marty Saltness, MD  loratadine  (CLARITIN ) 10 MG tablet Take 1 tablet (10 mg total) by mouth daily as needed for allergies or itching. 12/18/21   Cheryl Reusing, FNP  nitroGLYCERIN  (NITROSTAT ) 0.4 MG SL tablet Place 1 tablet (0.4 mg total) under the tongue every 5 (five) minutes as needed for chest pain. 06/21/15   Gladis Elsie BROCKS, PA-C  OXYGEN  Inhale 3-4 L into the lungs at bedtime. DURING THE DAY IF NEEDED Reported on 11/25/2015    [provider]  pantoprazole  (PROTONIX ) 40 MG tablet Take 1 tablet (40 mg total) by mouth daily. 02/06/24   Raford Lenis, MD  predniSONE  (DELTASONE ) 10 MG tablet Take 10 mg daily. 08/14/23   Iva Marty Saltness, MD  Respiratory Therapy  Supplies (NEBULIZER/TUBING/MOUTHPIECE) KIT 1 each by Does not apply route as directed. 07/24/19   Iva Marty Saltness, MD  revefenacin  (YUPELRI ) 175 MCG/3ML nebulizer solution Take 3 mLs (175 mcg total) by nebulization daily. 08/15/23   Iva Marty Saltness, MD  traMADol  (ULTRAM -ER) 300 MG 24 hr tablet Take 300 mg by mouth daily as needed for pain.    [provider]    Allergies: Aspirin , Benralizumab, Hornet venom, Iodinated contrast media, Iodine, Levaquin [levofloxacin in d5w], Nsaids, Penicillins, Perforomist  [formoterol ], Tolmetin, Buprenorphine hcl, Morphine  and codeine, and Oxycodone     Review of Systems  Updated Vital  Signs BP (!) 155/99   Pulse (!) 36   Temp 97.8 F (36.6 C) (Oral)   Resp 19   Ht 6' (1.829 m)   Wt 104.8 kg   SpO2 98%   BMI 31.33 kg/m   Physical Exam  (all labs ordered are listed, but only abnormal results are displayed) Labs Reviewed  BASIC METABOLIC PANEL WITH GFR - Abnormal; Notable for the following components:      Result Value   Sodium 133 (*)    Glucose, Bld 102 (*)    Creatinine, Ser 1.51 (*)    Calcium  8.6 (*)    GFR, Estimated 48 (*)    All other components within normal limits  CBC - Abnormal; Notable for the following components:   RBC 4.14 (*)    All other components within normal limits  RESP PANEL BY RT-PCR (RSV, FLU A&B, COVID)  RVPGX2  BRAIN NATRIURETIC PEPTIDE  TROPONIN I (HIGH SENSITIVITY)  TROPONIN I (HIGH SENSITIVITY)    EKG: EKG Interpretation Date/Time:  Friday June 26 2024 15:34:12 EDT Ventricular Rate:  57 PR Interval:  203 QRS Duration:  90 QT Interval:  411 QTC Calculation: 401 R Axis:   15  Text Interpretation: Sinus rhythm no ischemic appearance. no change from previous Confirmed by Armenta Canning [phone]) on 06/26/2024 4:10:30 PM  Radiology: DG Chest 2 View Result Date: 06/26/2024 EXAM: 2 VIEW(S) XRAY OF THE CHEST 06/26/2024 04:27:00 PM COMPARISON: 08/02/2023 CLINICAL HISTORY: Chest pain. FINDINGS: LUNGS AND PLEURA: No focal pulmonary opacity. No pulmonary edema. No pleural effusion. No pneumothorax. HEART AND MEDIASTINUM: No acute abnormality of the cardiac and mediastinal silhouettes. BONES AND SOFT TISSUES: Mild multilevel degenerative changes of thoracic spine. IMPRESSION: 1. No acute cardiopulmonary pathology related to chest pain. Electronically signed by: Lonni Necessary MD 06/26/2024 04:33 PM EDT RP Workstation: [id number]     Procedures   Medications Ordered in the ED - No data to display                                  Medical Decision Making Amount and/or Complexity of Data Reviewed Labs:  ordered. Radiology: ordered.   Patient and arm pain as outlined with some feeling of difficulty catching his breath and fatigue.  Will proceed with ACS workup rule out CHF\pneumonia\metabolic derangement.  Patient is stable at this time.  He does not have any pain currently.  Aspiratory panel negative troponin 5 and flat.  BNP 84.  CBC normal.  Metabolic panel normal except GFR 48.  Chest x-ray interpreted radiology no focal findings.  At this time, patient wishes to leave.  All findings are negative currently.  I do feel patient is stable for discharge.  Return precautions reviewed.  Patient has follow-up arranged.     Final diagnoses:  Chest pain, unspecified type  ED Discharge Orders     None          Armenta Canning, MD 06/26/24 1851

## 2024-06-26 NOTE — ED Notes (Signed)
 Pt states  I am really fine pt asked if he is allowed to leave. RN told pt that he can leave but that it is recommended he stays and gets treated. Pt agreed to stay until his wife gets to the hospital.

## 2024-06-26 NOTE — ED Notes (Signed)
 Pt verbalized understanding of discharge instructions. Pt ambulated from ed. Pt refused discharge papers.

## 2024-06-26 NOTE — ED Notes (Signed)
 Pt did not want to change into a gown when he first got here

## 2024-09-05 ENCOUNTER — Encounter (HOSPITAL_COMMUNITY): Payer: Self-pay

## 2024-09-05 ENCOUNTER — Other Ambulatory Visit: Payer: Self-pay

## 2024-09-05 ENCOUNTER — Emergency Department (HOSPITAL_COMMUNITY)
Admission: EM | Admit: 2024-09-05 | Discharge: 2024-09-05 | Disposition: A | Discharge status: Home / Self Care | Attending: Emergency Medicine | Admitting: Emergency Medicine

## 2024-09-05 ENCOUNTER — Emergency Department (HOSPITAL_COMMUNITY)

## 2024-09-05 DIAGNOSIS — J441 Chronic obstructive pulmonary disease with (acute) exacerbation: Secondary | ICD-10-CM | POA: Insufficient documentation

## 2024-09-05 DIAGNOSIS — R0602 Shortness of breath: Secondary | ICD-10-CM | POA: Diagnosis present

## 2024-09-05 DIAGNOSIS — I10 Essential (primary) hypertension: Secondary | ICD-10-CM | POA: Insufficient documentation

## 2024-09-05 DIAGNOSIS — R079 Chest pain, unspecified: Secondary | ICD-10-CM

## 2024-09-05 DIAGNOSIS — E119 Type 2 diabetes mellitus without complications: Secondary | ICD-10-CM | POA: Diagnosis not present

## 2024-09-05 DIAGNOSIS — Z7952 Long term (current) use of systemic steroids: Secondary | ICD-10-CM | POA: Insufficient documentation

## 2024-09-05 LAB — HEPATIC FUNCTION PANEL
ALT: 30 U/L (ref 0–44)
AST: 24 U/L (ref 15–41)
Albumin: 3.7 g/dL (ref 3.5–5.0)
Alkaline Phosphatase: 45 U/L (ref 38–126)
Bilirubin, Direct: 0.2 mg/dL (ref 0.0–0.2)
Indirect Bilirubin: 0.6 mg/dL (ref 0.3–0.9)
Total Bilirubin: 0.8 mg/dL (ref 0.0–1.2)
Total Protein: 6.7 g/dL (ref 6.5–8.1)

## 2024-09-05 LAB — CBC WITH DIFFERENTIAL/PLATELET
Abs Immature Granulocytes: 0.03 K/uL (ref 0.00–0.07)
Basophils Absolute: 0.1 K/uL (ref 0.0–0.1)
Basophils Relative: 1 %
Eosinophils Absolute: 0.3 K/uL (ref 0.0–0.5)
Eosinophils Relative: 5 %
HCT: 41.9 % (ref 39.0–52.0)
Hemoglobin: 14.1 g/dL (ref 13.0–17.0)
Immature Granulocytes: 1 %
Lymphocytes Relative: 23 %
Lymphs Abs: 1.5 K/uL (ref 0.7–4.0)
MCH: 32.6 pg (ref 26.0–34.0)
MCHC: 33.7 g/dL (ref 30.0–36.0)
MCV: 97 fL (ref 80.0–100.0)
Monocytes Absolute: 0.5 K/uL (ref 0.1–1.0)
Monocytes Relative: 7 %
Neutro Abs: 4.3 K/uL (ref 1.7–7.7)
Neutrophils Relative %: 63 %
Platelets: 209 K/uL (ref 150–400)
RBC: 4.32 MIL/uL (ref 4.22–5.81)
RDW: 12.7 % (ref 11.5–15.5)
WBC: 6.7 K/uL (ref 4.0–10.5)
nRBC: 0 % (ref 0.0–0.2)

## 2024-09-05 LAB — BASIC METABOLIC PANEL WITH GFR
Anion gap: 12 (ref 5–15)
BUN: 11 mg/dL (ref 8–23)
CO2: 21 mmol/L — ABNORMAL LOW (ref 22–32)
Calcium: 8.9 mg/dL (ref 8.9–10.3)
Chloride: 104 mmol/L (ref 98–111)
Creatinine, Ser: 1.19 mg/dL (ref 0.61–1.24)
GFR, Estimated: >60 mL/min (ref >60)
Glucose, Bld: 102 mg/dL — ABNORMAL HIGH (ref 70–99)
Potassium: 3.6 mmol/L (ref 3.5–5.1)
Sodium: 137 mmol/L (ref 135–145)

## 2024-09-05 LAB — CBG MONITORING, ED: Glucose-Capillary: 120 mg/dL — ABNORMAL HIGH (ref 70–99)

## 2024-09-05 LAB — BRAIN NATRIURETIC PEPTIDE: B Natriuretic Peptide: 17.4 pg/mL (ref 0.0–100.0)

## 2024-09-05 LAB — TROPONIN I (HIGH SENSITIVITY)
Troponin I (High Sensitivity): 6 ng/L (ref <18)
Troponin I (High Sensitivity): 6 ng/L (ref <18)

## 2024-09-05 LAB — LACTIC ACID, PLASMA: Lactic Acid, Venous: 1.4 mmol/L (ref 0.5–1.9)

## 2024-09-05 LAB — MAGNESIUM: Magnesium: 1.8 mg/dL (ref 1.7–2.4)

## 2024-09-05 MED ORDER — METHYLPREDNISOLONE SODIUM SUCC 125 MG IJ SOLR
125.0000 mg | Freq: Once | INTRAMUSCULAR | Status: AC
  Administered 2024-09-05: 125 mg via INTRAVENOUS
  Filled 2024-09-05: qty 2

## 2024-09-05 MED ORDER — CLOPIDOGREL BISULFATE 300 MG PO TABS: 300.0000 mg | ORAL_TABLET | Freq: Once | ORAL | Status: DC

## 2024-09-05 MED ORDER — NITROGLYCERIN 2 % TD OINT
1.0000 [in_us] | TOPICAL_OINTMENT | Freq: Once | TRANSDERMAL | Status: DC
  Filled 2024-09-05: qty 1

## 2024-09-05 MED ORDER — TICAGRELOR 90 MG PO TABS
180.0000 mg | ORAL_TABLET | Freq: Once | ORAL | Status: AC
  Administered 2024-09-05: 180 mg via ORAL
  Filled 2024-09-05: qty 2

## 2024-09-05 MED ORDER — ALBUTEROL SULFATE HFA 108 (90 BASE) MCG/ACT IN AERS: 2.0000 | INHALATION_SPRAY | RESPIRATORY_TRACT | 0 refills | Status: AC | PRN

## 2024-09-05 MED ORDER — AZITHROMYCIN 250 MG PO TABS
250.0000 mg | ORAL_TABLET | Freq: Every day | ORAL | 0 refills | Status: AC
Start: 2024-09-05 — End: ?

## 2024-09-05 MED ORDER — ALBUTEROL SULFATE (2.5 MG/3ML) 0.083% IN NEBU
2.5000 mg | INHALATION_SOLUTION | Freq: Once | RESPIRATORY_TRACT | Status: AC
  Administered 2024-09-05: 2.5 mg via RESPIRATORY_TRACT
  Filled 2024-09-05: qty 3

## 2024-09-05 MED ORDER — PREDNISONE 50 MG PO TABS
ORAL_TABLET | ORAL | 0 refills | Status: AC
Start: 2024-09-05 — End: ?

## 2024-09-05 NOTE — Discharge Instructions (Signed)
 Your workup today was relatively reassuring.  Dr. Melvenia did want you to be seen by her cardiologist but you do not want stay for this.  Please be sure to follow-up with your cardiologist that you have the appointment with as an outpatient.  Take the prednisone  as prescribed as well as the azithromycin .  Use 2 puffs of the albuterol  every 4 hours over the next 24 to 48 hours.  Please return to the emergency department with chest pain or worsening symptoms.

## 2024-09-05 NOTE — ED Provider Notes (Signed)
 [place] EMERGENCY DEPARTMENT AT [facility] Provider Note   CSN: [id number] Arrival date & time: 09/05/24  8087     Patient presents with: No chief complaint on file.   [name] is a 73 y.o. male.   HPI Patient presents for shortness of breath.  Medical history includes postpolio syndrome, HTN, bipolar disorder, OSA, GERD, alcohol abuse, DM, HLD, COPD.  He states that he has chronic shortness of breath and typically uses 4 nebulized breathing treatments per day at home.  His breathing worsened this morning at 2 AM.  Symptoms persisted throughout the day.  EMS was called.  With EMS, he also endorsed 6/10 severity chest pain.  Patient reports that he has had 2 weeks of intermittent chest pain which is worsened with activity.  EMS noted initial blood pressures of 180s SBP.  He was given 1 NTG and chest pain resolved.  Blood pressure improved to 150s.  Patient remains chest pain-free at this time.  His breathing has improved after 2 DuoNebs with EMS.  No other medications were given prior to arrival.    Prior to Admission medications   Medication Sig Start Date End Date Taking? Authorizing Provider  acetaminophen  (TYLENOL ) 500 MG tablet Take 500 mg by mouth every 4 (four) hours as needed. 05/06/20   [provider]  albuterol  (VENTOLIN  HFA) 108 (90 Base) MCG/ACT inhaler Inhale 2 puffs into the lungs every 4 (four) hours as needed for wheezing or shortness of breath. 08/14/23   Iva Marty Saltness, MD  Albuterol -Budesonide  (AIRSUPRA ) 90-80 MCG/ACT AERO Inhale 2 puffs into the lungs every 4 (four) hours as needed. 12/19/23   Iva Marty Saltness, MD  arformoterol  (BROVANA ) 15 MCG/2ML NEBU Take 2 mLs (15 mcg total) by nebulization 2 (two) times daily. 12/24/23   Iva Marty Saltness, MD  atorvastatin  (LIPITOR) 20 MG tablet Take 20 mg by mouth daily. 03/30/21   [provider]  budesonide  (PULMICORT ) 0.5 MG/2ML nebulizer solution Take 2 mLs (0.5 mg total) by  nebulization 2 (two) times daily. 08/14/23   Iva Marty Saltness, MD  cetirizine  (ZYRTEC ) 10 MG tablet Take 1 tablet (10 mg total) by mouth 2 (two) times daily. 12/12/21   Iva Marty Saltness, MD  colchicine  0.6 MG tablet Take 0.6 mg by mouth daily.    [provider]  EPINEPHrine  (EPIPEN  2-PAK) 0.3 mg/0.3 mL IJ SOAJ injection Inject 0.3 mLs (0.3 mg total) into the muscle as needed for anaphylaxis. 07/03/19   Iva Marty Saltness, MD  ipratropium-albuterol  (DUONEB) 0.5-2.5 (3) MG/3ML SOLN Inhale 3 mLs into the lungs 2 (two) times daily. 03/06/24   Iva Marty Saltness, MD  levalbuterol  (XOPENEX ) 1.25 MG/3ML nebulizer solution Take 1.25 mg by nebulization every 4 (four) hours as needed for wheezing. 06/26/22   Iva Marty Saltness, MD  loratadine  (CLARITIN ) 10 MG tablet Take 1 tablet (10 mg total) by mouth daily as needed for allergies or itching. 12/18/21   Cheryl Reusing, FNP  nitroGLYCERIN  (NITROSTAT ) 0.4 MG SL tablet Place 1 tablet (0.4 mg total) under the tongue every 5 (five) minutes as needed for chest pain. 06/21/15   Gladis Elsie BROCKS, PA-C  OXYGEN  Inhale 3-4 L into the lungs at bedtime. DURING THE DAY IF NEEDED Reported on 11/25/2015    [provider]  pantoprazole  (PROTONIX ) 40 MG tablet Take 1 tablet (40 mg total) by mouth daily. 02/06/24   Raford Lenis, MD  predniSONE  (DELTASONE ) 10 MG tablet Take 10 mg daily. 08/14/23   Iva Marty  Louis, MD  Respiratory Therapy Supplies (NEBULIZER/TUBING/MOUTHPIECE) KIT 1 each by Does not apply route as directed. 07/24/19   Iva Marty Saltness, MD  revefenacin  (YUPELRI ) 175 MCG/3ML nebulizer solution Take 3 mLs (175 mcg total) by nebulization daily. 08/15/23   Iva Marty Saltness, MD  traMADol  (ULTRAM -ER) 300 MG 24 hr tablet Take 300 mg by mouth daily as needed for pain.    [provider]    Allergies: Aspirin , Benralizumab, Hornet venom, Iodinated contrast media, Iodine, Levaquin [levofloxacin in d5w], Nsaids,  Penicillins, Perforomist  [formoterol ], Tolmetin, Buprenorphine hcl, Morphine  and codeine, and Oxycodone     Review of Systems  Respiratory:  Positive for chest tightness and shortness of breath.   Cardiovascular:  Positive for chest pain.  All other systems reviewed and are negative.   Updated Vital Signs BP (!) 144/21   Pulse 85   Resp 17   Ht 6' (1.829 m)   Wt 104.8 kg   SpO2 96%   BMI 31.33 kg/m   Physical Exam Vitals and nursing note reviewed.  Constitutional:      General: He is not in acute distress.    Appearance: Normal appearance. He is well-developed. He is not ill-appearing, toxic-appearing or diaphoretic.  HENT:     Head: Normocephalic and atraumatic.     Right Ear: External ear normal.     Left Ear: External ear normal.     Nose: Nose normal.     Mouth/Throat:     Mouth: Mucous membranes are moist.  Eyes:     Extraocular Movements: Extraocular movements intact.     Conjunctiva/sclera: Conjunctivae normal.  Cardiovascular:     Rate and Rhythm: Normal rate and regular rhythm.  Pulmonary:     Effort: Pulmonary effort is normal. No respiratory distress.     Breath sounds: Examination of the right-lower field reveals decreased breath sounds. Examination of the left-lower field reveals decreased breath sounds. Decreased breath sounds and wheezing present.  Chest:     Chest wall: No tenderness.  Abdominal:     General: There is no distension.     Palpations: Abdomen is soft.     Tenderness: There is no abdominal tenderness.  Musculoskeletal:        General: No swelling. Normal range of motion.     Cervical back: Normal range of motion and neck supple.  Skin:    General: Skin is warm and dry.     Coloration: Skin is not jaundiced or pale.  Neurological:     General: No focal deficit present.     Mental Status: He is alert and oriented to person, place, and time.  Psychiatric:        Mood and Affect: Mood normal.        Behavior: Behavior normal.     (all  labs ordered are listed, but only abnormal results are displayed) Labs Reviewed  CBC WITH DIFFERENTIAL/PLATELET  BRAIN NATRIURETIC PEPTIDE  BASIC METABOLIC PANEL WITH GFR  MAGNESIUM   HEPATIC FUNCTION PANEL  LACTIC ACID, PLASMA  LACTIC ACID, PLASMA  CBG MONITORING, ED  TROPONIN I (HIGH SENSITIVITY)  TROPONIN I (HIGH SENSITIVITY)    EKG: EKG Interpretation Date/Time:  Saturday September 05 2024 19:56:37 EST Ventricular Rate:  85 PR Interval:  153 QRS Duration:  93 QT Interval:  358 QTC Calculation: 426 R Axis:   134  Text Interpretation: Sinus rhythm Right axis deviation Borderline T abnormalities, inferior leads Confirmed by Melvenia Motto [phone]) on 09/05/2024 8:35:14 PM  Radiology: [facility] Chest Portable 1  View Result Date: 09/05/2024 EXAM: 1 VIEW(S) XRAY OF THE CHEST 09/05/2024 07:36:00 PM COMPARISON: 06/26/2024 CLINICAL HISTORY: Chest pain. FINDINGS: LUNGS AND PLEURA: Low lung volumes. Lungs are clear. No pleural effusion. No pneumothorax. HEART AND MEDIASTINUM: No acute abnormality of the cardiac and mediastinal silhouettes. BONES AND SOFT TISSUES: No acute osseous abnormality. IMPRESSION: 1. No acute cardiopulmonary process to explain chest pain. Electronically signed by: Oneil Devonshire MD 09/05/2024 07:47 PM EST RP Workstation: [id number]     Procedures   Medications Ordered in the ED  nitroGLYCERIN  (NITROGLYN) 2 % ointment 1 inch (0 inches Topical Hold 09/05/24 2018)  albuterol  (PROVENTIL ) (2.5 MG/3ML) 0.083% nebulizer solution 2.5 mg (has no administration in time range)  ticagrelor  (BRILINTA ) tablet 180 mg (has no administration in time range)  methylPREDNISolone  sodium succinate (SOLU-MEDROL ) 125 mg/2 mL injection 125 mg (125 mg Intravenous Given 09/05/24 2015)                                    Medical Decision Making Amount and/or Complexity of Data Reviewed Labs: ordered. Radiology: ordered.  Risk Prescription drug management.   This patient presents to the ED for  concern of shortness of breath and chest pain, this involves an extensive number of treatment options, and is a complaint that carries with it a high risk of complications and morbidity.  The differential diagnosis includes COPD exacerbation, CHF, pneumonia, pericarditis, ACS, anemia, acidosis   Co morbidities / Chronic conditions that complicate the patient evaluation  postpolio syndrome, HTN, bipolar disorder, OSA, GERD, alcohol abuse, DM, HLD, COPD   Additional history obtained:  Additional history obtained from EMR External records from outside source obtained and reviewed including EMS   Lab Tests:  I Ordered, and personally interpreted labs.  The pertinent results include: Normal hemoglobin, no leukocytosis, normal BNP, remaining lab work pending at time of signout   Imaging Studies ordered:  I ordered imaging studies including chest x-ray I independently visualized and interpreted imaging which showed no acute findings I agree with the radiologist interpretation   Cardiac Monitoring: / EKG:  The patient was maintained on a cardiac monitor.  I personally viewed and interpreted the cardiac monitored which showed an underlying rhythm of: Sinus rhythm   Problem List / ED Course / Critical interventions / Medication management  Patient presenting for acute on chronic shortness of breath, starting early this morning.  He also endorses intermittent chest pain over the past 2 weeks that is worse with exertion and improves with rest.  With EMS, his 6/10 severity chest pain resolved after 1 an SL NTG.  This raises confirm for possible unstable angina.  Per chart review, he is followed by [facility] cardiology but seems to have been lost to follow-up.  He has not had any cardiac testing in the past 8 years.  Stress test in 2017 was low risk.  Patient has a listed allergy  to aspirin .  Brilinta  was ordered.  Patient's shortness of breath likely secondary to COPD.  His symptoms did improve with  2 DuoNebs prior to arrival.  On arrival, he continues to have wheezing throughout lung fields as well as diminished breath sounds bibasilarly.  Workup was initiated.  Solu-Medrol  and albuterol  were ordered.  Given his improved chest pain with NTG tablet, NTG ointment was ordered.  Lab work was pending at time of signout.  Care of patient was signed out to oncoming ED provider. I ordered medication  including NTG for chest pain; Solu-Medrol  and albuterol  for COPD; Brilinta  for concern of unstable angina Reevaluation of the patient after these medicines showed that the patient improved I have reviewed the patients home medicines and have made adjustments as needed  Social Determinants of Health:  Lives at home with wife     Final diagnoses:  Chest pain, unspecified type  COPD exacerbation [facility])    ED Discharge Orders     None          Melvenia Motto, MD 09/05/24 2131

## 2024-09-05 NOTE — ED Triage Notes (Signed)
 PT BIB [facility] from home with a initial c/o not knowing whetehr he took his amlodipine  or bisoprol. When EMS arrived PT had c/o CP and sob. PT was given nitroglycerin  and CP resolved.EMS gave 10mg  albuterol  and 1mg  of atro, SOB has continued and wheezing heard in lungs.

## 2024-09-05 NOTE — ED Provider Notes (Addendum)
 I received the patient in signout.  Plan was for discussion with cardiology for concerns for possible unstable angina.  I did call and discussed this with cardiology.  The plan was to have the patient evaluated and likely admitted for this.  The patient did not want to stay for evaluation by cardiology and did not want to be admitted to the hospital.  On reassessment his vital signs are stable.  His pulse ox 96% on room air.  He is no longer wheezing here.  I did explain the risks and benefits of hospitalization versus discharge including debility and death as possible consequences.  The patient does understand this and has full decision-making capacity.  He is discharged through shared decision making and did not want a wait for cardiology evaluation.  I did explain to the patient that Dr. Melvenia was concerned for unstable angina and that normally this is evaluated with further evaluation in the hospital and he declines.  He is discharged with return precautions.  With the patient's initial wheezing on exam and with his improvement here I will send the patient home with steroids as well as azithromycin  and albuterol  for likely COPD exacerbation although I did explain to the patient that I could not say for sure if this accounts for his symptoms he has been having. Physical Exam  BP (!) 144/21   Pulse 85   Resp 17   Ht 6' (1.829 m)   Wt 104.8 kg   SpO2 96%   BMI 31.33 kg/m   Physical Exam No acute distress, no wheezing, no respiratory distress noted Procedures  Procedures  ED Course / MDM    Medical Decision Making Amount and/or Complexity of Data Reviewed Labs: ordered. Radiology: ordered.  Risk Prescription drug management.          Ula Prentice SAUNDERS, MD 09/05/24 [id number]    Ula Prentice SAUNDERS, MD 09/05/24 [phone]
